# Patient Record
Sex: Male | Born: 1960
Health system: Southern US, Community
[De-identification: ages and names within clinical notes are randomized; demographics above are authoritative.]

## PROBLEM LIST (undated history)

## (undated) DIAGNOSIS — I255 Ischemic cardiomyopathy: Secondary | ICD-10-CM

## (undated) DIAGNOSIS — I119 Hypertensive heart disease without heart failure: Secondary | ICD-10-CM

## (undated) DIAGNOSIS — I10 Essential (primary) hypertension: Secondary | ICD-10-CM

## (undated) DIAGNOSIS — I208 Other forms of angina pectoris: Secondary | ICD-10-CM

## (undated) DIAGNOSIS — I5042 Chronic combined systolic (congestive) and diastolic (congestive) heart failure: Secondary | ICD-10-CM

## (undated) DIAGNOSIS — E119 Type 2 diabetes mellitus without complications: Secondary | ICD-10-CM

## (undated) DIAGNOSIS — K219 Gastro-esophageal reflux disease without esophagitis: Secondary | ICD-10-CM

## (undated) DIAGNOSIS — G4733 Obstructive sleep apnea (adult) (pediatric): Secondary | ICD-10-CM

## (undated) DIAGNOSIS — I2510 Atherosclerotic heart disease of native coronary artery without angina pectoris: Secondary | ICD-10-CM

## (undated) DIAGNOSIS — I219 Acute myocardial infarction, unspecified: Secondary | ICD-10-CM

## (undated) DIAGNOSIS — N184 Chronic kidney disease, stage 4 (severe): Secondary | ICD-10-CM

## (undated) DIAGNOSIS — Z8739 Personal history of other diseases of the musculoskeletal system and connective tissue: Secondary | ICD-10-CM

## (undated) DIAGNOSIS — I2089 Other forms of angina pectoris: Secondary | ICD-10-CM

## (undated) DIAGNOSIS — Q613 Polycystic kidney, unspecified: Secondary | ICD-10-CM

## (undated) DIAGNOSIS — Z9989 Dependence on other enabling machines and devices: Secondary | ICD-10-CM

## (undated) DIAGNOSIS — I350 Nonrheumatic aortic (valve) stenosis: Secondary | ICD-10-CM

## (undated) DIAGNOSIS — D649 Anemia, unspecified: Secondary | ICD-10-CM

## (undated) DIAGNOSIS — E785 Hyperlipidemia, unspecified: Secondary | ICD-10-CM

## (undated) DIAGNOSIS — I5043 Acute on chronic combined systolic (congestive) and diastolic (congestive) heart failure: Secondary | ICD-10-CM

## (undated) HISTORY — PX: CORONARY ANGIOPLASTY WITH STENT PLACEMENT: SHX49

## (undated) HISTORY — DX: Chronic kidney disease, stage 4 (severe): N18.4

## (undated) HISTORY — DX: Other forms of angina pectoris: I20.89

## (undated) HISTORY — PX: HERNIA REPAIR: SHX51

## (undated) HISTORY — DX: Type 2 diabetes mellitus without complications: E11.9

## (undated) HISTORY — DX: Anemia, unspecified: D64.9

## (undated) HISTORY — DX: Hyperlipidemia, unspecified: E78.5

## (undated) HISTORY — DX: Nonrheumatic aortic (valve) stenosis: I35.0

## (undated) HISTORY — DX: Atherosclerotic heart disease of native coronary artery without angina pectoris: I25.10

## (undated) HISTORY — DX: Chronic combined systolic (congestive) and diastolic (congestive) heart failure: I50.42

## (undated) HISTORY — PX: CYSTECTOMY: SUR359

## (undated) HISTORY — DX: Acute on chronic combined systolic (congestive) and diastolic (congestive) heart failure: I50.43

## (undated) HISTORY — DX: Ischemic cardiomyopathy: I25.5

## (undated) HISTORY — DX: Other forms of angina pectoris: I20.8

---

## 2004-09-10 HISTORY — PX: CORONARY ARTERY BYPASS GRAFT: SHX141

## 2010-07-27 ENCOUNTER — Observation Stay (HOSPITAL_COMMUNITY): Admission: EM | Admit: 2010-07-27 | Discharge: 2010-07-28 | Payer: Self-pay | Admitting: Emergency Medicine

## 2010-07-27 ENCOUNTER — Ambulatory Visit: Payer: Self-pay | Admitting: Cardiology

## 2010-09-10 HISTORY — PX: CARDIAC CATHETERIZATION: SHX172

## 2010-11-21 LAB — CBC
HCT: 39.7 % (ref 39.0–52.0)
HCT: 36.2 % — ABNORMAL LOW (ref 39.0–52.0)
Hemoglobin: 13.2 g/dL (ref 13.0–17.0)
MCH: 28.5 pg (ref 26.0–34.0)
MCH: 28.8 pg (ref 26.0–34.0)
MCHC: 33.2 g/dL (ref 30.0–36.0)
MCHC: 34 g/dL (ref 30.0–36.0)
MCV: 85.7 fL (ref 78.0–100.0)
MCV: 84.8 fL (ref 78.0–100.0)
Platelets: 184 K/uL (ref 150–400)
Platelets: 156 10*3/uL (ref 150–400)
RBC: 4.63 MIL/uL (ref 4.22–5.81)
RDW: 12.8 % (ref 11.5–15.5)
RDW: 12.7 % (ref 11.5–15.5)
WBC: 5.8 K/uL (ref 4.0–10.5)

## 2010-11-21 LAB — POCT CARDIAC MARKERS
CKMB, poc: 1.3 ng/mL (ref 1.0–8.0)
Myoglobin, poc: 89.1 ng/mL (ref 12–200)
Troponin i, poc: <0.05 ng/mL (ref 0.00–0.09)

## 2010-11-21 LAB — LIPID PANEL
Cholesterol: 184 mg/dL (ref 0–200)
HDL: 35 mg/dL — ABNORMAL LOW (ref >39)
LDL Cholesterol: 117 mg/dL — ABNORMAL HIGH (ref 0–99)
Total CHOL/HDL Ratio: 5.3 ratio
Triglycerides: 161 mg/dL — ABNORMAL HIGH (ref <150)
VLDL: 32 mg/dL (ref 0–40)

## 2010-11-21 LAB — DIFFERENTIAL
Basophils Absolute: 0 K/uL (ref 0.0–0.1)
Basophils Relative: 1 % (ref 0–1)
Eosinophils Absolute: 0.1 K/uL (ref 0.0–0.7)
Eosinophils Relative: 2 % (ref 0–5)
Lymphocytes Relative: 37 % (ref 12–46)
Lymphs Abs: 2.1 K/uL (ref 0.7–4.0)
Monocytes Absolute: 0.6 K/uL (ref 0.1–1.0)
Monocytes Relative: 10 % (ref 3–12)
Neutro Abs: 3 K/uL (ref 1.7–7.7)
Neutrophils Relative %: 52 % (ref 43–77)

## 2010-11-21 LAB — GLUCOSE, CAPILLARY
Glucose-Capillary: 204 mg/dL — ABNORMAL HIGH (ref 70–99)
Glucose-Capillary: 106 mg/dL — ABNORMAL HIGH (ref 70–99)
Glucose-Capillary: 146 mg/dL — ABNORMAL HIGH (ref 70–99)

## 2010-11-21 LAB — BASIC METABOLIC PANEL WITH GFR
BUN: 17 mg/dL (ref 6–23)
CO2: 26 meq/L (ref 19–32)
Calcium: 9 mg/dL (ref 8.4–10.5)
Chloride: 113 meq/L — ABNORMAL HIGH (ref 96–112)
Creatinine, Ser: 1.32 mg/dL (ref 0.4–1.5)
GFR calc Af Amer: >60 mL/min (ref >60)
GFR calc non Af Amer: 57 mL/min — ABNORMAL LOW (ref >60)
Glucose, Bld: 107 mg/dL — ABNORMAL HIGH (ref 70–99)
Potassium: 4.5 meq/L (ref 3.5–5.1)
Sodium: 143 meq/L (ref 135–145)

## 2010-11-21 LAB — COMPREHENSIVE METABOLIC PANEL
Albumin: 3.3 g/dL — ABNORMAL LOW (ref 3.5–5.2)
Alkaline Phosphatase: 66 U/L (ref 39–117)
BUN: 16 mg/dL (ref 6–23)
Calcium: 8.8 mg/dL (ref 8.4–10.5)
Creatinine, Ser: 1.35 mg/dL (ref 0.4–1.5)
Glucose, Bld: 117 mg/dL — ABNORMAL HIGH (ref 70–99)
Potassium: 3.9 mEq/L (ref 3.5–5.1)
Total Protein: 6.1 g/dL (ref 6.0–8.3)

## 2010-11-21 LAB — CK TOTAL AND CKMB (NOT AT ARMC)
CK, MB: 2 ng/mL (ref 0.3–4.0)
Relative Index: 1 (ref 0.0–2.5)
Total CK: 207 U/L (ref 7–232)

## 2010-11-21 LAB — PROTIME-INR
INR: 0.94 (ref 0.00–1.49)
Prothrombin Time: 12.8 seconds (ref 11.6–15.2)

## 2010-11-21 LAB — CARDIAC PANEL(CRET KIN+CKTOT+MB+TROPI)
CK, MB: 1.3 ng/mL (ref 0.3–4.0)
Relative Index: 1 (ref 0.0–2.5)
Total CK: 147 U/L (ref 7–232)
Total CK: 132 U/L (ref 7–232)
Troponin I: 0.01 ng/mL (ref 0.00–0.06)

## 2010-11-21 LAB — HEMOGLOBIN A1C
Hgb A1c MFr Bld: 7.3 % — ABNORMAL HIGH (ref <5.7)
Mean Plasma Glucose: 163 mg/dL — ABNORMAL HIGH (ref <117)

## 2010-11-21 LAB — TROPONIN I: Troponin I: 0.01 ng/mL (ref 0.00–0.06)

## 2010-12-01 ENCOUNTER — Emergency Department (HOSPITAL_COMMUNITY): Payer: Medicare Other

## 2010-12-01 ENCOUNTER — Inpatient Hospital Stay (HOSPITAL_COMMUNITY)
Admission: EM | Admit: 2010-12-01 | Discharge: 2010-12-06 | DRG: 303 | Disposition: A | Discharge status: Home / Self Care | Payer: Medicare Other | Attending: Internal Medicine | Admitting: Internal Medicine

## 2010-12-01 DIAGNOSIS — Z7982 Long term (current) use of aspirin: Secondary | ICD-10-CM

## 2010-12-01 DIAGNOSIS — I252 Old myocardial infarction: Secondary | ICD-10-CM

## 2010-12-01 DIAGNOSIS — E785 Hyperlipidemia, unspecified: Secondary | ICD-10-CM | POA: Diagnosis present

## 2010-12-01 DIAGNOSIS — I200 Unstable angina: Secondary | ICD-10-CM | POA: Diagnosis present

## 2010-12-01 DIAGNOSIS — I517 Cardiomegaly: Secondary | ICD-10-CM

## 2010-12-01 DIAGNOSIS — N179 Acute kidney failure, unspecified: Secondary | ICD-10-CM | POA: Diagnosis present

## 2010-12-01 DIAGNOSIS — E7800 Pure hypercholesterolemia, unspecified: Secondary | ICD-10-CM | POA: Diagnosis present

## 2010-12-01 DIAGNOSIS — N189 Chronic kidney disease, unspecified: Secondary | ICD-10-CM | POA: Diagnosis present

## 2010-12-01 DIAGNOSIS — I259 Chronic ischemic heart disease, unspecified: Secondary | ICD-10-CM | POA: Diagnosis present

## 2010-12-01 DIAGNOSIS — R079 Chest pain, unspecified: Secondary | ICD-10-CM

## 2010-12-01 DIAGNOSIS — I129 Hypertensive chronic kidney disease with stage 1 through stage 4 chronic kidney disease, or unspecified chronic kidney disease: Secondary | ICD-10-CM | POA: Diagnosis present

## 2010-12-01 DIAGNOSIS — I2510 Atherosclerotic heart disease of native coronary artery without angina pectoris: Principal | ICD-10-CM | POA: Diagnosis present

## 2010-12-01 DIAGNOSIS — Z951 Presence of aortocoronary bypass graft: Secondary | ICD-10-CM

## 2010-12-01 LAB — CBC
HCT: 35.4 % — ABNORMAL LOW (ref 39.0–52.0)
Hemoglobin: 12.3 g/dL — ABNORMAL LOW (ref 13.0–17.0)
MCH: 28.2 pg (ref 26.0–34.0)
MCV: 82.1 fL (ref 78.0–100.0)
Platelets: 187 10*3/uL (ref 150–400)
RBC: 4.31 MIL/uL (ref 4.22–5.81)
RBC: 4.22 MIL/uL (ref 4.22–5.81)
WBC: 6.2 10*3/uL (ref 4.0–10.5)
WBC: 8.4 10*3/uL (ref 4.0–10.5)

## 2010-12-01 LAB — CARDIAC PANEL(CRET KIN+CKTOT+MB+TROPI)
CK, MB: 1.3 ng/mL (ref 0.3–4.0)
CK, MB: 1.2 ng/mL (ref 0.3–4.0)
Total CK: 106 U/L (ref 7–232)
Troponin I: 0.01 ng/mL (ref 0.00–0.06)

## 2010-12-01 LAB — GLUCOSE, CAPILLARY
Glucose-Capillary: >600 mg/dL (ref 70–99)
Glucose-Capillary: 546 mg/dL — ABNORMAL HIGH (ref 70–99)
Glucose-Capillary: 419 mg/dL — ABNORMAL HIGH (ref 70–99)
Glucose-Capillary: 329 mg/dL — ABNORMAL HIGH (ref 70–99)
Glucose-Capillary: 235 mg/dL — ABNORMAL HIGH (ref 70–99)
Glucose-Capillary: 157 mg/dL — ABNORMAL HIGH (ref 70–99)
Glucose-Capillary: 167 mg/dL — ABNORMAL HIGH (ref 70–99)
Glucose-Capillary: 300 mg/dL — ABNORMAL HIGH (ref 70–99)

## 2010-12-01 LAB — HEPATIC FUNCTION PANEL
ALT: 17 U/L (ref 0–53)
AST: 17 U/L (ref 0–37)
Albumin: 3.8 g/dL (ref 3.5–5.2)
Alkaline Phosphatase: 94 U/L (ref 39–117)
Total Bilirubin: 0.7 mg/dL (ref 0.3–1.2)

## 2010-12-01 LAB — POCT I-STAT, CHEM 8
BUN: 32 mg/dL — ABNORMAL HIGH (ref 6–23)
Calcium, Ion: 1.16 mmol/L (ref 1.12–1.32)
Creatinine, Ser: 2.2 mg/dL — ABNORMAL HIGH (ref 0.4–1.5)
Hemoglobin: 12.6 g/dL — ABNORMAL LOW (ref 13.0–17.0)
TCO2: 21 mmol/L (ref 0–100)

## 2010-12-01 LAB — COMPREHENSIVE METABOLIC PANEL
Alkaline Phosphatase: 76 U/L (ref 39–117)
BUN: 25 mg/dL — ABNORMAL HIGH (ref 6–23)
Glucose, Bld: 249 mg/dL — ABNORMAL HIGH (ref 70–99)
Potassium: 4.2 mEq/L (ref 3.5–5.1)
Total Bilirubin: 0.7 mg/dL (ref 0.3–1.2)
Total Protein: 6.9 g/dL (ref 6.0–8.3)

## 2010-12-01 LAB — TSH: TSH: 2.202 u[IU]/mL (ref 0.350–4.500)

## 2010-12-01 LAB — D-DIMER, QUANTITATIVE: D-Dimer, Quant: <0.22 ug/mL-FEU (ref 0.00–0.48)

## 2010-12-01 LAB — MAGNESIUM: Magnesium: 2.2 mg/dL (ref 1.5–2.5)

## 2010-12-01 LAB — LIPID PANEL
Cholesterol: 148 mg/dL (ref 0–200)
LDL Cholesterol: UNDETERMINED mg/dL (ref 0–99)

## 2010-12-01 LAB — DIFFERENTIAL
Lymphocytes Relative: 33 % (ref 12–46)
Lymphs Abs: 2.1 10*3/uL (ref 0.7–4.0)
Neutrophils Relative %: 54 % (ref 43–77)

## 2010-12-01 LAB — PROTIME-INR
INR: 0.86 (ref 0.00–1.49)
Prothrombin Time: 11.9 seconds (ref 11.6–15.2)

## 2010-12-01 LAB — MRSA PCR SCREENING: MRSA by PCR: NEGATIVE

## 2010-12-01 LAB — POCT CARDIAC MARKERS: Troponin i, poc: <0.05 ng/mL (ref 0.00–0.09)

## 2010-12-02 DIAGNOSIS — I200 Unstable angina: Secondary | ICD-10-CM

## 2010-12-02 LAB — GLUCOSE, CAPILLARY
Glucose-Capillary: 307 mg/dL — ABNORMAL HIGH (ref 70–99)
Glucose-Capillary: 305 mg/dL — ABNORMAL HIGH (ref 70–99)
Glucose-Capillary: 339 mg/dL — ABNORMAL HIGH (ref 70–99)
Glucose-Capillary: 316 mg/dL — ABNORMAL HIGH (ref 70–99)
Glucose-Capillary: 141 mg/dL — ABNORMAL HIGH (ref 70–99)
Glucose-Capillary: 118 mg/dL — ABNORMAL HIGH (ref 70–99)
Glucose-Capillary: 110 mg/dL — ABNORMAL HIGH (ref 70–99)
Glucose-Capillary: 214 mg/dL — ABNORMAL HIGH (ref 70–99)
Glucose-Capillary: 339 mg/dL — ABNORMAL HIGH (ref 70–99)

## 2010-12-02 LAB — CBC
MCH: 29 pg (ref 26.0–34.0)
MCHC: 35.2 g/dL (ref 30.0–36.0)
Platelets: 167 10*3/uL (ref 150–400)
RBC: 3.72 MIL/uL — ABNORMAL LOW (ref 4.22–5.81)

## 2010-12-02 LAB — BASIC METABOLIC PANEL
Calcium: 8.3 mg/dL — ABNORMAL LOW (ref 8.4–10.5)
Creatinine, Ser: 1.34 mg/dL (ref 0.4–1.5)
GFR calc Af Amer: >60 mL/min (ref >60)

## 2010-12-02 LAB — SODIUM, URINE, RANDOM: Sodium, Ur: 102 mEq/L

## 2010-12-02 LAB — CREATININE, URINE, RANDOM: Creatinine, Urine: 118.6 mg/dL

## 2010-12-02 LAB — LIPASE, BLOOD: Lipase: 41 U/L (ref 11–59)

## 2010-12-03 LAB — CARDIAC PANEL(CRET KIN+CKTOT+MB+TROPI)
CK, MB: 1.4 ng/mL (ref 0.3–4.0)
CK, MB: 1.8 ng/mL (ref 0.3–4.0)
Relative Index: 1.2 (ref 0.0–2.5)
Total CK: 118 U/L (ref 7–232)
Total CK: 155 U/L (ref 7–232)
Troponin I: 0.02 ng/mL (ref 0.00–0.06)

## 2010-12-03 LAB — BASIC METABOLIC PANEL
BUN: 15 mg/dL (ref 6–23)
CO2: 22 mEq/L (ref 19–32)
Chloride: 110 mEq/L (ref 96–112)
Glucose, Bld: 283 mg/dL — ABNORMAL HIGH (ref 70–99)
Potassium: 4.3 mEq/L (ref 3.5–5.1)

## 2010-12-03 LAB — CBC
HCT: 33.3 % — ABNORMAL LOW (ref 39.0–52.0)
Hemoglobin: 11.6 g/dL — ABNORMAL LOW (ref 13.0–17.0)
MCV: 82.8 fL (ref 78.0–100.0)
RDW: 12.7 % (ref 11.5–15.5)
WBC: 7 10*3/uL (ref 4.0–10.5)

## 2010-12-03 LAB — GLUCOSE, CAPILLARY: Glucose-Capillary: 228 mg/dL — ABNORMAL HIGH (ref 70–99)

## 2010-12-04 LAB — BASIC METABOLIC PANEL
BUN: 13 mg/dL (ref 6–23)
Creatinine, Ser: 1.34 mg/dL (ref 0.4–1.5)
GFR calc non Af Amer: 56 mL/min — ABNORMAL LOW (ref >60)
Glucose, Bld: 279 mg/dL — ABNORMAL HIGH (ref 70–99)

## 2010-12-04 LAB — GLUCOSE, CAPILLARY
Glucose-Capillary: 210 mg/dL — ABNORMAL HIGH (ref 70–99)
Glucose-Capillary: 329 mg/dL — ABNORMAL HIGH (ref 70–99)

## 2010-12-04 LAB — CARDIAC PANEL(CRET KIN+CKTOT+MB+TROPI): Troponin I: 0.01 ng/mL (ref 0.00–0.06)

## 2010-12-04 LAB — TRIGLYCERIDES: Triglycerides: 480 mg/dL — ABNORMAL HIGH (ref <150)

## 2010-12-04 LAB — HEPARIN LEVEL (UNFRACTIONATED)
Heparin Unfractionated: 0.79 IU/mL — ABNORMAL HIGH (ref 0.30–0.70)
Heparin Unfractionated: 0.47 IU/mL (ref 0.30–0.70)
Heparin Unfractionated: 0.56 IU/mL (ref 0.30–0.70)

## 2010-12-05 ENCOUNTER — Encounter: Payer: Self-pay | Admitting: Cardiology

## 2010-12-05 LAB — CBC
HCT: 33.9 % — ABNORMAL LOW (ref 39.0–52.0)
MCHC: 34.8 g/dL (ref 30.0–36.0)
MCV: 83.1 fL (ref 78.0–100.0)
Platelets: 192 10*3/uL (ref 150–400)
RDW: 12.7 % (ref 11.5–15.5)
WBC: 6.7 10*3/uL (ref 4.0–10.5)

## 2010-12-05 LAB — BASIC METABOLIC PANEL
CO2: 25 mEq/L (ref 19–32)
Calcium: 9.1 mg/dL (ref 8.4–10.5)
Creatinine, Ser: 1.35 mg/dL (ref 0.4–1.5)
GFR calc non Af Amer: 56 mL/min — ABNORMAL LOW (ref >60)
Glucose, Bld: 239 mg/dL — ABNORMAL HIGH (ref 70–99)
Sodium: 141 mEq/L (ref 135–145)

## 2010-12-05 LAB — GLUCOSE, CAPILLARY: Glucose-Capillary: 249 mg/dL — ABNORMAL HIGH (ref 70–99)

## 2010-12-06 LAB — BASIC METABOLIC PANEL
CO2: 24 mEq/L (ref 19–32)
Calcium: 9.6 mg/dL (ref 8.4–10.5)
Chloride: 110 mEq/L (ref 96–112)
GFR calc Af Amer: >60 mL/min (ref >60)
Glucose, Bld: 138 mg/dL — ABNORMAL HIGH (ref 70–99)
Sodium: 141 mEq/L (ref 135–145)

## 2010-12-06 LAB — GLUCOSE, CAPILLARY: Glucose-Capillary: 221 mg/dL — ABNORMAL HIGH (ref 70–99)

## 2010-12-11 ENCOUNTER — Other Ambulatory Visit: Payer: Self-pay | Admitting: Family Medicine

## 2010-12-11 ENCOUNTER — Ambulatory Visit
Admission: RE | Admit: 2010-12-11 | Discharge: 2010-12-11 | Disposition: A | Discharge status: Home / Self Care | Payer: Medicare Other | Source: Ambulatory Visit | Attending: Family Medicine | Admitting: Family Medicine

## 2010-12-11 NOTE — H&P (Signed)
NAMELARNIE, Matthew Rowe              ACCOUNT NO.:  [id number]  MEDICAL RECORD NO.:  [id number]           PATIENT TYPE:  E  LOCATION:  [facility]                         FACILITY:  [facility]  PHYSICIAN:  Rise Patience, MDDATE OF BIRTH:  05-21-1961  DATE OF ADMISSION:  12/01/2010 DATE OF DISCHARGE:                             HISTORY & PHYSICAL   PRIMARY CARE PHYSICIAN:  Dr. Lorelei Pont.  CHIEF COMPLAINT:  Chest pain.  HISTORY OF PRESENT ILLNESS:  A 50 year old male with known history of CAD, status post CABG, status post stenting, hypertension, hyperlipidemia, and diabetes mellitus type 2 having experiencing chest pain since last night around 10 o'clock, feeling retrosternal pressure like with exertion and mild shortness of breath.  In the ER, the patient was found to have normal cardiac enzymes.  EKG did not show anything acute.  The patient has been admitted for chest pain management.  In addition, the patient's blood sugar was found to be more than 600, his anion gap is only 10.  The patient has been started on IV glucose stabilizer.  The patient states that he does have mild nausea, but no vomiting.  He denies any abdominal pain, dysuria, discharge, diarrhea, any weakness, loss of consciousness, or any focal deficits.  PAST MEDICAL HISTORY: 1. History of CAD, status post CABG, status post stenting. 2. History of hypertension. 3. History of diabetes mellitus type 2. 4. Hyperlipidemia. 5. Obesity.  PAST SURGICAL HISTORY:  CABG, stent placement, and a cyst removal from the face.  MEDICATIONS UPON ADMISSION: 1. Imdur 30 mg daily. 2. Aspirin 325 mg. 3. Nitroglycerin p.r.n. 4. Coreg 25 mg p.o. b.i.d.  ALLERGIES:  BIAXIN.  FAMILY HISTORY:  Significant for coronary artery disease in his father and had MI at age 15 to early 10s, stroke, and had MI.  SOCIAL HISTORY:  The patient lives in [place] with his wife.  Denies smoking cigarettes, drinking alcohol, or using illegal  drugs.  REVIEW OF SYSTEMS:  As per history of present illness nothing else significant.  PHYSICAL EXAMINATION:  GENERAL:  The patient examined at bedside, not in acute distress. VITAL SIGNS:  Blood pressure is 136/85, pulse 94 per minute, temperature 98.4, respirations 18 per minute, O2 sat 100%. HEENT:  Anicteric.  No pallor.  No discharge from ears, eyes, nose, or mouth. CHEST:  Bilateral air entry present.  No rhonchi, no crepitation. HEART:  S1 and S2 heard. ABDOMEN:  Soft, nontender.  Bowel sounds heard. CNS:  The patient alert, awake, and oriented to time, place, and person. Moves upper and lower extremities 5/5. EXTREMITIES:  Peripheral pulses felt.  No edema.  LABORATORY DATA:  EKG shows normal sinus rhythm, heart rate is around 95 beats per minute, poor R-wave progression, ST-T changes, which are comparable to the old EKG.  Chest x-ray shows no acute findings.  CBC: WBC 6.2, hemoglobin 12.6, hematocrit 37, PT/INR is 11.8.  Basic metabolic panel:  Sodium Q000111Q, potassium 4.4, chloride 102.  I-stat: Bicarb is 21, anion gap is 10, BUN 32, creatinine 2.2.  CK-MB is 136, troponin is 0.02, troponin I was less than 0.05, myoglobin 91.3.  ASSESSMENT: 1. Chest  pain to rule out acute coronary syndrome, concerning for     unstable angina. 2. Severe hyperglycemia secondary to uncontrolled diabetes mellitus     type 2. 3. Acute renal failure on chronic kidney disease. 4. History of hypertension. 5. History of coronary artery disease, status post coronary artery     bypass grafting, status post stenting. 6. Hyperlipidemia. 7. Obesity.  PLAN: 1. At this time, we will admit the patient to step-down unit. 2. For his chest pain, the patient has been started on IV     nitroglycerin.  We will place the patient also on heparin if lipase     is negative.  We will be cycling cardiac markers.  The patient will     be on aspirin.  The patient had just thrown up.  We are going to     check a  stat LFTs and lipase, if they are elevated, then we need to     check scan of the abdomen.  We are going to consult Cardiology. 3. For his acute renal failure on chronic kidney disease, at this     time, we are going to hydrate with normal saline.  Recheck CMET and     we will place the patient on strict intake, output, and urine     sodium and creatinine. 4. Severe hyperglycemia secondary to uncontrolled diabetes mellitus     type 2.  The patient is already started on IV glucose stabilizer.     Once his blood sugar gets controlled, we will change to long-acting     insulin.  We will check hemoglobin A1c. 5. Further recommendation as condition evolves.     Rise Patience, MD    ANK/MEDQ  D:  12/01/2010  T:  12/01/2010  Job:  [id number]  cc:   Dr. Lorelei Pont  Electronically Signed by Gean Birchwood MD on 12/11/2010 08:22:20 AM

## 2010-12-13 NOTE — Discharge Summary (Signed)
NAMESHAQUIL, Matthew Rowe              ACCOUNT NO.:  [id number]  MEDICAL RECORD NO.:  [id number]           PATIENT TYPE:  I  LOCATION:  [id number]                         FACILITY:  [facility]  PHYSICIAN:  Oren Binet, MD    DATE OF BIRTH:  June 18, 1961  DATE OF ADMISSION:  12/01/2010 DATE OF DISCHARGE:  12/06/2010                        DISCHARGE SUMMARY - REFERRING   PRIMARY CARE PRACTITIONER:  Darreld Mclean, MD.  PRIMARY CARDIOLOGIST:  Ludwig Lean. Doreatha Lew, M.D.  DISCHARGE DIAGNOSES: 1. Chest pain for further outpatient cardiac workup and follow up with     primary cardiologist. 2. Uncontrolled diabetes, now on insulin. 3. Acute renal failure, resolved with hydration. 4. Dyslipidemia.  SECONDARY DISCHARGE DIAGNOSES: 1. History of coronary artery disease with history of an acute     inferior wall myocardial infarction in 1999, status post RCA     stenting. 2. 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  He subsequently underwent a coronary artery bypass graft     with RIMA graft placed to the mid-distal right coronary artery.     Three days following coronary artery bypass graft, the patient had     an ST-segment elevation myocardial infarction, where he was noted     to have internal myocardial bridging involving the left anterior     descending artery, he then had 3 stents placed from the distal to     the proximal stent segment of the left anterior descending artery.     Four days after the above procedure, he developed acute thrombosis     of the distal LAD stent with complete occlusion of the LAD distally     and even proximally.  He underwent primary intervention at that     time with clot removal with inability to open the distal LAD beyond     the distal stent resulting in complete chronic occlusion of the LAD     as it curves around the LV apex.  The patient had a  repeat cardiac catheterization done in 2007, which showed a patent right internal mammary artery graft, chronic occlusion of the distal left anterior ascending artery following terminal portions of the previously placed stent.  Obstructive lesions within the proximal and mid stents as described above.  Since 2007, he has had no further cardiac workup. 1. History of hypertension. 2. History of diabetes. 3. History of dyslipidemia. 4. He also had a nuclear stress test done on July 28, 2010 with a     questionable small focus of inducible ischemia in the distal     inferior wall and anterior apex.  At that time, per the discharge     summary available in E chart, Cardiology felt that it was a low     risk scan.  DISCHARGE MEDICATIONS: 1. Amaryl 2 mg 1 tablet daily. 2. Amlodipine 10 mg 1 tablet p.o. daily. 3. Lantus 38 units subcutaneously at bedtime. 4. Losartan 100 mg p.o. daily. 5. Tylenol 500 mg 2 tablets p.o. twice day p.r.n. 6.  Lipitor 40 mg 1 tablet p.o. every evening. 7. Aspirin 325 mg 1 tablet p.o. daily. 8. Coreg 25 mg 1 tablet p.o. twice daily. 9. Imdur 60 mg 1 tablet daily. 10.Nitroglycerin sublingually 0.4 mg 1 tablet under the tongue every 5     minutes up to 3 dose p.r.n. 11.Vitamin E 1000 units 1 capsule p.o. twice daily. 12.Ambien 10 mg 1 tablet p.o. at bedtime.  CONSULTATIONS:  [facility].  HISTORY OF PRESENT ILLNESS:  The patient is a 50 year old male with an extensive past medical history of coronary artery disease, status post CABG and stenting; hypertension; dyslipidemia; and diabetes, who was admitted on December 01, 2010 for chest pain.  He was also found to have a blood sugar more than 600 and required IV glucose stabilizer.  He was then admitted to a step-down unit.  For further details, please see the history and physical that was dictated by Dr. Hal Hope on admission.  A 2-D echocardiogram done on December 01, 1010 showed an EF around  55%-60%. Left ventricle wall thickness was increased in the pattern of mild LVH.  PERTINENT LABORATORY DATA: 1. Cardiac enzymes were cycled, and these were negative. 2. TSH was 2.02. 3. HbA1c is 11.8. 4. Creatinine on admission was 1.65. 5. D-dimer was less than 0.22. 6. Triglycerides were 480.  PERTINENT RADIOLOGICAL STUDIES:  Portable chest x-ray shows no acute findings.  BRIEF HOSPITAL COURSE: 1. Chest pain.  The patient has an extensively complicated history of     ischemic heart disease.  It all started in 1999, when he had     inferior wall MI secondary to a RCA lesion that was stented.  In     the early 2000, he then started having chest pain.  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  Following this, he underwent coronary artery bypass grafting     with a RIMA to the mid-to-distal RCA.  Unfortunately, following     this, he had extensive complications including an anterior wall     infarction secondary to intramyocardial bridging involving the LAD,     which required 3 stents to be placed from the proximal to the     distal segment.  Four days following this, the patient again had an     acute thrombosis and further cardiac cath done during that time     showed occlusion of the LAD distally and even proximally.  He then     underwent primary intervention at that time with clot removal with     inability to open the distal LAD beyond the distal stent resulting     in complete chronic occlusion of the distal LAD.  His last cardiac     workup involving a left heart catheterization was in 2007, which     showed the proximal segment of the LAD to be normal.  However,     there was complete occlusion of the distal LAD after the third     stent.  The RIMA graft was noted to be patent as  well.  Given this     history, this patient was admitted to the hospital and a Cardiology     consultation was obtained.  The patient was also started  on a     nitroglycerin drip along with a heparin drip.  He was placed on     aspirin along with statin.  His beta-blocker was resumed.  It was     felt that this was all unstable angina and Cardiology were planning     to do a cardiac catheterization.  However, they are waiting for his     prior workup as outlined above to be obtained from [facility] in [place].  This then finally arrived.  After reviewing     this, the patient's cardiac cath was then subsequently cancelled     and his heparin and nitroglycerin drips were also discontinued.     Following discontinuation of his nitroglycerin and heparin drip,     the patient has been chest pain free for more than 24 hours.  I did     have a detailed discussion with Dr. Mare Ferrari over the phone.  His     prior records that are available in the chart were discussed in     detail with Dr. Mare Ferrari.  Dr. Mare Ferrari felt that because the     patient is chest pain free off heparin and nitroglycerin drip, he     feels that the patient does not need an inpatient left heart     cardiac catheterization, and at this point, he is suggesting that     this patient be discharged home to follow up with Dr. Doreatha Lew.  An     appointment has been scheduled for him on April 11 at 10:00 a.m.     This information is also available on the pink sheet as well.  A 2-     D echocardiogram done during this admission showed an EF around 55%-     60%.  Please note that this patient also had a nuclear stress test     done in November 2011, which was felt to be low risk.  The patient     will be continued on aspirin, statin, beta-blocker along with     Imdur, and further workup will be done as the patient will follow     up with [facility].  The patient has been instructed to     seek medical  attention if he again has severe chest pain. 2. Uncontrolled diabetes.  The patient has a longstanding history of     diabetes as noted above.  He had a blood sugar more than 600 on     admission.  He was probably placed on a glucose stabilizer     protocol.  Following that, he was weaned off the insulin and     started on Lantus.  He was given extensive diabetic education.     Plan is to discharge him on Lantus at 38 units along with Amaryl.     He will follow up with his primary care doctor, Dr. Lorelei Pont for     further optimization of his diabetic regimen. 3. Hypertension.  This was moderately well controlled during this     hospitalization.  On day of discharge, his amlodipine has been     increased to 10 mg, and he will resume rest of his other     medications noted as above.  He will follow up with his primary     care doctor as well as his primary cardiologist for further     optimization of his blood pressure control as well. 4.  Dyslipidemia.  The patient has an elevated triglyceride level.  It     is thought that this was secondary to diabetes.  He has been     resumed on his Lipitor.  With further control of his sugars, if his     triglyceride levels do not come down, he will need to be started on     perhaps fenofibrate.  We will defer this to his primary care     practitioner and his primary cardiologist.  DISPOSITION:  It is, at this time, felt that this patient is stable to be discharged home with outpatient followup with his primary doctors as outlined above.  FOLLOW-UP INSTRUCTIONS: 1. The patient will follow up with Dr. Lorelei Pont.  He is to call and     make an appointment within 1 week's time.  He claims understanding. 2. The patient is to follow up with Dr. Romeo Apple, the patient's     primary cardiologist on April 11 at 10:00 a.m.  This appointment     has already been made for the patient. 3. An outpatient diabetic education consult has also been ordered for      the patient. 4. The patient is to seek immediate medical attention if he has     recurrent chest pain given his complicated history.  Total time spent equals 60 minutes.     Oren Binet, MD     SG/MEDQ  D:  12/06/2010  T:  12/06/2010  Job:  [id number]  cc:   Darreld Mclean, MD Ludwig Lean Doreatha Lew, M.D.  Electronically Signed by Oren Binet  on 12/13/2010 04:46:12 AM

## 2010-12-20 ENCOUNTER — Ambulatory Visit (INDEPENDENT_AMBULATORY_CARE_PROVIDER_SITE_OTHER): Payer: Medicare Other | Admitting: Cardiology

## 2010-12-20 ENCOUNTER — Encounter: Payer: Self-pay | Admitting: Cardiology

## 2010-12-20 DIAGNOSIS — R079 Chest pain, unspecified: Secondary | ICD-10-CM

## 2010-12-20 DIAGNOSIS — N179 Acute kidney failure, unspecified: Secondary | ICD-10-CM

## 2010-12-20 DIAGNOSIS — E1169 Type 2 diabetes mellitus with other specified complication: Secondary | ICD-10-CM | POA: Insufficient documentation

## 2010-12-20 DIAGNOSIS — E119 Type 2 diabetes mellitus without complications: Secondary | ICD-10-CM

## 2010-12-20 DIAGNOSIS — I2510 Atherosclerotic heart disease of native coronary artery without angina pectoris: Secondary | ICD-10-CM

## 2010-12-20 NOTE — Consult Note (Signed)
Matthew Rowe, Matthew Rowe              ACCOUNT NO.:  [id number]  MEDICAL RECORD NO.:  [id number]           PATIENT TYPE:  I  LOCATION:  2911                         FACILITY:  [facility]  PHYSICIAN:  Ludwig Lean. Doreatha Lew, M.D.DATE OF BIRTH:  Jan 28, 1961  DATE OF CONSULTATION:  12/01/2010 DATE OF DISCHARGE:                                CONSULTATION   PRIMARY CARDIOLOGIST:  New patient to [facility].  PRIMARY CARE PROVIDER:  Dr. Lorelei Pont.  REFERRING PHYSICIAN:  Rise Patience, MD  REASON FOR CONSULTATION:  Chest pain.  HISTORY OF PRESENT ILLNESS:  This is a 50 year old African American gentleman with known coronary artery disease status post coronary artery bypass grafting x3 per patient in 2006, although our last consultation note states that he had 1 vessel coronary artery bypass grafting.  We will obtain these records.  The patient was last seen in November 2011 for chest pain.  At this time a Myoview was completed showing questionable ischemia as well an ejection fraction of 46%.  At this time it was decided that we would continue with medical management as well as risk factors modification as he does have uncontrolled diabetes mellitus, hypertension, hyperlipidemia, and obesity.  Since discharge he states he has had intermittent chest pain that progressively worsened over the last 4 weeks.  Over this time, he has taken entire bottle of nitroglycerin.  The nitroglycerin usually relieved his pain.  He presented to the emergency department because last evening around 10 o'clock, he began to have retrosternal chest pressure as well as a burning sensation.  He took his normal nitroglycerin, Tylenol, and Coreg and was able to fall sleep.  He was then awoken around 1:30 a.m. with crushing chest pain, this was unrelieved by nitroglycerin.  When the pain increased in intensity, he came to the emergency department for evaluation.  In the emergency department, the patient's  initial glucose was noted to be greater than 700.  He was started on IV glucose stabilizer at this time.  With his morning chest pain, he also had nausea and had subsequently vomited with improvement in burning sensation.  The patient still with minimal chest pain.  His EKG is without acute changes and is unchanged from prior tracing in November.  His initial set of cardiac enzymes are negative x1.  He was placed on nitroglycerin drip which has eased his chest pain but is still minimally there.  The patient has been admitted by the Hospitalist Service and Cardiology has been consulted for chest pain.  Of note, the patient has decreased his metformin dose secondary to uncontrollable diarrhea.  He is to follow up with Dr. Edilia Bo in the near future for reevaluation.  PAST MEDICAL HISTORY: 1. Coronary artery disease status post coronary artery bypass grafting     in 2006 in [place].  Questionable as this is 3 graft versus 1     graft.     a.     Status post myocardial infarction in 2006 with subsequent      PCI x2 or 3, the patient is unsure.     b.     Status post Myoview  in November 2011 demonstrating apical      infarct with questionable small focus of inducible ischemia in the      distal inferior wall in the inferior apex.  Left ventricular      ejection fraction 46%. 2. Uncontrolled diabetes mellitus, the patient has cut his metformin     dose secondary to diarrhea. 3. Hypertension. 4. Hyperlipidemia. 5. Obesity.  SOCIAL HISTORY:  The patient lives in [place] with his wife.  He is on disability.  He denies any tobacco, alcohol, or illicit drug use.  FAMILY HISTORY:  Pertinent for early coronary artery disease.  His father is deceased from a massive heart attack with his first heart attack being in his 52s.  His sister had myocardial infarction at age 71.  He has multiple cousins and nieces with early onset coronary artery disease.  ALLERGIES:  BIAXIN.  HOME  MEDICATIONS: 1. Imdur 60 mg daily. 2. Aspirin 325 mg as needed. 3. Nitroglycerin sublingual as needed. 4. Coreg 25 mg twice daily. 5. Metformin 1000 mg twice daily, although the patient is only taking     500 mg daily. 6. Tylenol Extra Strength as needed. 7. Ambien 10 mg daily. 8. Lisinopril 40 mg daily. 9. Pravastatin 40 mg daily.  REVIEW OF SYSTEMS:  All pertinent positives and negatives as stated in HPI.  All other systems have been reviewed and are negative.  PHYSICAL EXAMINATION:  VITAL SIGNS:  Temperature 98.4, pulse 96, respiration 18, blood pressure 120-142/76 and 92, O2 saturation 100% on room air. GENERAL:  This is a mildly obese middle aged gentleman.  He is in no acute distress. HEENT:  Normal. NECK:  Supple without bruit or JVD.  No lymphadenopathy. CARDIOVASCULAR:  Heart regular rate and rhythm with S1, S2.  No murmur, rub, or gallop noted.  PMI is normal.  Pulses are 2+ and equal bilaterally. LUNGS:  Clear to auscultation bilaterally without wheezes, rales, or rhonchi. ABDOMEN:  Soft, nontender, positive bowel sounds x4.  There is negative hepatosplenomegaly. EXTREMITIES:  No clubbing, cyanosis, or edema noted. MUSCULOSKELETAL:  No joint deformities or effusions. NEURO:  Alert and oriented x3, cranial nerves II-XII grossly intact.  RADIOLOGY:  Chest x-ray showing no acute cardiopulmonary findings.  EKG showing sinus rhythm at a rate of 95 beats per minute.  There is poor R- wave progression throughout.  There is inferior Q-waves noted.  There are no acute ischemic changes.  LABORATORY DATA:  WBC 6.2, hemoglobin 12.3, hematocrit 35.4, platelet 181,000.  Sodium 133, potassium 4.4, BUN 32, creatinine 2.2, glucose greater than 700, lipase 78.  Cardiac enzymes negative x1.  ASSESSMENT/PLAN:  This is a 50 year old African American gentleman with known coronary artery disease status post coronary artery bypass grafting in 2006 as well as uncontrolled diabetes  mellitus who presents with chest pain that is worrisome for unstable angina.  His most recent Myoview was in November 2011 that showed questionable ischemia as well as a depressed ejection fraction of 46%.  The patient is also with acute on chronic renal insufficiency with a creatinine of 2.2.  Our recommendation would be for repeat cardiac catheterization. Prior to proceeding with catheterization, the patient's initial set of enzymes are negative.  EKG is without acute changes.  The patient needs rehydration as well as control of glucose.  This would be per primary team.  With improvement in his renal function, we will plan for cardiac catheterization on Monday.  If the patient has chest pain with acute EKG changes, the patient  will be taken emergently to the cath lab.  We will continue to monitor the patient closely. Thank you for this evaluation.     Cecille Amsterdam, PA-C   ______________________________ Ludwig Lean Doreatha Lew, M.D.    NB/MEDQ  D:  12/01/2010  T:  12/02/2010  Job:  [id number]  Electronically Signed by Pennie Rushing P.A. on 12/13/2010 10:46:52 AM Electronically Signed by Romeo Apple M.D. on 12/20/2010 11:12:58 AM

## 2010-12-21 ENCOUNTER — Encounter: Payer: Self-pay | Admitting: Cardiology

## 2010-12-21 NOTE — Progress Notes (Signed)
Subjective:   Mr. Crisman is seen in the office today as a post hospital visit. He was admitted to [facility] on March 23 with chest pain and uncontrolled diabetes. He had acute renal failure and dyslipidemia. Because of the comorbid conditions, no catheterization was performed. Diabetes was brought under further control and has been under the direction of Dr. Janett Billow Copland for primary care.  He had a history of inferior myocardial infarction in 1999 and had angioplasty of the right coronary artery. 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 It appears that he had a right internal mammary artery graft to the right coronary artery. He subsequently was noted to have ST segment elevation and was noted to have bridging of the left anterior descending. He had stents placed in a long segment of the left anterior descending and it would appear that for 5 days after the procedure, he had an acute stent thrombosis resolving and chronic occlusion of the LAD as it curves around the apex. He had repeat cardiac catheterization in 2007 which shows a patent right internal mammary artery graft, chronic occlusion of the distal left anterior descending following the terminal portions of the previously placed stent. He's not had further workup since that time.  His other problems include hypertension, diabetes, dyslipidemia and a questionable stress study in November of 2011 with a small focus of inducible ischemia in the distal inferior and apical portions of the heart. Overall, at that time, it was felt to be a low risk study. However, he is continued to have chest discomfort. A 2-D echocardiogram done in March 2012, showed ejection fraction of 55-60% the pattern of mild LVH.  Current Outpatient Prescriptions  Medication Sig Dispense Refill  . acetaminophen (TYLENOL) 500 MG tablet Take 500 mg by mouth every 6 (six) hours as needed. 2  tablets bid as needed       . amLODipine (NORVASC) 10 MG tablet Take 10 mg by mouth daily.        Marland Kitchen aspirin 325 MG tablet Take 325 mg by mouth daily.        . carvedilol (COREG) 25 MG tablet Take 25 mg by mouth 2 (two) times daily with a meal.        . insulin glargine (LANTUS) 100 UNIT/ML injection Inject 38 Units into the skin at bedtime.        . isosorbide mononitrate (IMDUR) 60 MG 24 hr tablet Take 60 mg by mouth daily.        Marland Kitchen losartan (COZAAR) 100 MG tablet Take 100 mg by mouth daily.        . nitroGLYCERIN (NITROSTAT) 0.4 MG SL tablet Place 0.4 mg under the tongue every 5 (five) minutes as needed.        . zolpidem (AMBIEN) 10 MG tablet Take 10 mg by mouth at bedtime.        Marland Kitchen atorvastatin (LIPITOR) 40 MG tablet Take 40 mg by mouth daily.        Marland Kitchen glimepiride (AMARYL) 2 MG tablet Take 2 mg by mouth daily before breakfast.        . metFORMIN (GLUCOPHAGE) 500 MG tablet Take 1 tablet (500 mg total) by mouth 2 (two) times daily with a meal.  60 tablet    . pravastatin (PRAVACHOL) 40 MG tablet Take 1 tablet (40 mg total) by mouth every evening.  30 tablet    . vitamin  E 1000 UNIT capsule Take 1,000 Units by mouth 2 (two) times daily.          Allergies  Allergen Reactions  . Ciprocin-Fluocin-Procin (Fluocinolone Acetonide)   . Glimepiride (Amaryl)     Elevates liver function     Patient Active Problem List  Diagnoses  . Chest pain  . Renal failure, acute  . Diabetes mellitus  . Dyslipidemia  . Coronary artery disease  . Acute MI, inferior wall    History  Smoking status  . Never Smoker   Smokeless tobacco  . Not on file    History  Alcohol Use No    No family history on file.  Review of Systems:   The patient denies any heat or cold intolerance.  No weight gain or weight loss.  The patient denies headaches or blurry vision.  There is no cough or sputum production.  The patient denies dizziness.  There is no hematuria or hematochezia.  The patient denies any muscle  aches or arthritis.  The patient denies any rash.  The patient denies frequent falling or instability.  There is no history of depression or anxiety.  All other systems were reviewed and are negative.   Physical Exam:   He's a pleasant white male in no acute distress. His weight is 225. Blood pressure is 140/98, heart rates 82.The head is normocephalic and atraumatic.  Pupils are equally round and reactive to light.  Sclerae nonicteric.  Conjunctiva is clear.  Oropharynx is unremarkable.  There's adequate oral airway.  Neck is supple there are no masses.  Thyroid is not enlarged.  There is no lymphadenopathy.  Lungs are clear.  Chest is symmetric.  Heart shows a regular rate and rhythm.  S1 and S2 are normal.  There is no murmur click or gallop.  Abdomen is soft normal bowel sounds.  There is no organomegaly.  Genital and rectal deferred.  Extremities are without edema.  Peripheral pulses are adequate.  Neurologically intact.  Full range of motion.  The patient is not depressed.  Skin is warm and dry.  Assessment / Plan:

## 2010-12-21 NOTE — Assessment & Plan Note (Signed)
He continues to have substernal chest discomfort. He has multiple cardiovascular risk factors and recurrent discomfort.   I think we need to consider the possibility of cardiac catheterization.  It would appear that his chest discomfort is a relatively chronic feature but is still needs to be defined. He is interested in a radial artery catheter if done.

## 2010-12-21 NOTE — Assessment & Plan Note (Signed)
We need records from Dr. Edilia Bo regarding management of his blood sugars.

## 2010-12-21 NOTE — Assessment & Plan Note (Signed)
We need further followup to see if his blood sugars have been stabilized. We'll try to get records from Dr. Edilia Bo.

## 2010-12-21 NOTE — Assessment & Plan Note (Signed)
We need to get updated records of his lipids and creatinine to see whether cardiac catheterization with a possible.

## 2010-12-27 ENCOUNTER — Ambulatory Visit (INDEPENDENT_AMBULATORY_CARE_PROVIDER_SITE_OTHER): Payer: Medicare Other | Admitting: Cardiology

## 2010-12-27 ENCOUNTER — Other Ambulatory Visit: Payer: Self-pay | Admitting: *Deleted

## 2010-12-27 ENCOUNTER — Encounter: Payer: Self-pay | Admitting: Cardiology

## 2010-12-27 DIAGNOSIS — R079 Chest pain, unspecified: Secondary | ICD-10-CM

## 2010-12-27 NOTE — Progress Notes (Signed)
Subjective:   [name] comes in today for one-week followup. As his blood sugar is better controlled, he is having less problems with chest discomfort, shortness of breath, or rectal dysfunction. His energy level is better. He did see blood sugars are still in the 220 range the knees beginning to be able to be a little bit more active without chest symptoms.he has a history of coronary artery disease with previous inferior myocardial infarction anomalous coronary artery and a RIMA graft to the mid right coronary artery was occluded stents in the left anterior descending. He's not had a repeat cardiac catheterization since 2006. When he was hospitalized on March of 2012, his diabetes was uncontrolled with blood sugars over 600 and he had mild acute renal insufficiency. Overall, he is improving.  Current Outpatient Prescriptions  Medication Sig Dispense Refill  . acetaminophen (TYLENOL) 500 MG tablet Take 500 mg by mouth every 6 (six) hours as needed. 2 tablets bid as needed       . amLODipine (NORVASC) 10 MG tablet Take 10 mg by mouth daily.        Marland Kitchen aspirin 325 MG tablet Take 325 mg by mouth daily.        . carvedilol (COREG) 25 MG tablet Take 25 mg by mouth 2 (two) times daily with a meal.        . insulin glargine (LANTUS) 100 UNIT/ML injection Inject 38 Units into the skin at bedtime.        . isosorbide mononitrate (IMDUR) 60 MG 24 hr tablet Take 60 mg by mouth daily.        Marland Kitchen losartan (COZAAR) 100 MG tablet Take 50 mg by mouth daily.       . metFORMIN (GLUCOPHAGE) 500 MG tablet Take 1,000 mg by mouth. 500 mg in the morning and 1000 mg in the evening  60 tablet    . nitroGLYCERIN (NITROSTAT) 0.4 MG SL tablet Place 0.4 mg under the tongue every 5 (five) minutes as needed.        . pravastatin (PRAVACHOL) 40 MG tablet Take 1 tablet (40 mg total) by mouth every evening.  30 tablet    . vitamin E 1000 UNIT capsule Take 1,000 Units by mouth 2 (two) times daily.        Marland Kitchen zolpidem (AMBIEN) 10 MG  tablet Take 10 mg by mouth at bedtime.        Marland Kitchen DISCONTD: atorvastatin (LIPITOR) 40 MG tablet Take 40 mg by mouth daily.        Marland Kitchen DISCONTD: glimepiride (AMARYL) 2 MG tablet Take 2 mg by mouth daily before breakfast.          Allergies  Allergen Reactions  . Biaxin   . Ciprocin-Fluocin-Procin (Fluocinolone Acetonide)   . Glimepiride (Amaryl)     Elevates liver function     Patient Active Problem List  Diagnoses  . Chest pain  . Renal failure, acute  . Dyslipidemia  . Coronary artery disease  . Acute MI, inferior wall  . Diabetes mellitus    History  Smoking status  . Never Smoker   Smokeless tobacco  . Never Used    History  Alcohol Use No    Family History  Problem Relation Age of Onset  . Cancer Mother   . Diabetes Father   . Heart disease Father   . Anemia Mother   . Heart attack Father   . Heart failure Father   . Hyperlipidemia Father   .  Hypertension Father   . Kidney disease Mother   . Sudden death Father   . Kidney failure Sister 5  . Heart attack Sister   . Kidney disease Sister     Review of Systems:   The patient denies any heat or cold intolerance.  No weight gain or weight loss.  The patient denies headaches or blurry vision.  There is no cough or sputum production.  The patient denies dizziness.  There is no hematuria or hematochezia.  The patient denies any muscle aches or arthritis.  The patient denies any rash.  The patient denies frequent falling or instability.  There is no history of depression or anxiety.  All other systems were reviewed and are negative.   Physical Exam:   Vital signs are reviewed. He's a pleasant but moderately obese black male.The head is normocephalic and atraumatic.  Pupils are equally round and reactive to light.  Sclerae nonicteric.  Conjunctiva is clear.  Oropharynx is unremarkable.  There's adequate oral airway.  Neck is supple there are no masses.  Thyroid is not enlarged.  There is no lymphadenopathy.  Lungs are  clear.  Chest is symmetric.  Heart shows a regular rate and rhythm.  S1 and S2 are normal.  There is no murmur click or gallop.  Abdomen is soft normal bowel sounds.  There is no organomegaly.  Genital and rectal deferred.  Extremities are without edema.  Peripheral pulses are adequate.  Neurologically intact.  Full range of motion.  The patient is not depressed.  Skin is warm and dry.  Assessment / Plan:

## 2010-12-27 NOTE — Assessment & Plan Note (Signed)
He clearly is better since he has had better blood sugar control. I will allow him to work on his diabetes over the next month and we'll see him back again. If he continues to have substernal chest discomfort, we could consider catheterization at that point in time.

## 2010-12-27 NOTE — Assessment & Plan Note (Signed)
Will review his records from the hospital over the next week. If renal function is satisfactory, we'll make arrangements for him to be admitted for cardiac catheterization in light of the recurrent chest pain.

## 2011-01-01 ENCOUNTER — Emergency Department (HOSPITAL_COMMUNITY)
Admission: EM | Admit: 2011-01-01 | Discharge: 2011-01-01 | Disposition: A | Discharge status: Home / Self Care | Payer: Medicare Other | Attending: Emergency Medicine | Admitting: Emergency Medicine

## 2011-01-01 DIAGNOSIS — I10 Essential (primary) hypertension: Secondary | ICD-10-CM | POA: Insufficient documentation

## 2011-01-01 DIAGNOSIS — M79609 Pain in unspecified limb: Secondary | ICD-10-CM | POA: Insufficient documentation

## 2011-01-01 DIAGNOSIS — E785 Hyperlipidemia, unspecified: Secondary | ICD-10-CM | POA: Insufficient documentation

## 2011-01-01 DIAGNOSIS — S139XXA Sprain of joints and ligaments of unspecified parts of neck, initial encounter: Secondary | ICD-10-CM | POA: Insufficient documentation

## 2011-01-01 DIAGNOSIS — E119 Type 2 diabetes mellitus without complications: Secondary | ICD-10-CM | POA: Insufficient documentation

## 2011-01-01 DIAGNOSIS — I2510 Atherosclerotic heart disease of native coronary artery without angina pectoris: Secondary | ICD-10-CM | POA: Insufficient documentation

## 2011-01-01 DIAGNOSIS — M542 Cervicalgia: Secondary | ICD-10-CM | POA: Insufficient documentation

## 2011-01-01 DIAGNOSIS — Z951 Presence of aortocoronary bypass graft: Secondary | ICD-10-CM | POA: Insufficient documentation

## 2011-01-25 ENCOUNTER — Ambulatory Visit (INDEPENDENT_AMBULATORY_CARE_PROVIDER_SITE_OTHER): Payer: Medicare Other | Admitting: Cardiology

## 2011-01-25 ENCOUNTER — Encounter: Payer: Self-pay | Admitting: Cardiology

## 2011-01-25 DIAGNOSIS — I2510 Atherosclerotic heart disease of native coronary artery without angina pectoris: Secondary | ICD-10-CM

## 2011-01-25 DIAGNOSIS — E119 Type 2 diabetes mellitus without complications: Secondary | ICD-10-CM

## 2011-01-25 DIAGNOSIS — N179 Acute kidney failure, unspecified: Secondary | ICD-10-CM

## 2011-01-25 NOTE — Assessment & Plan Note (Signed)
He will see Dr. Hassell Done next week. Lab work is checked today and will be forwarded to Dr. Edilia Bo and Dr. Hassell Done.

## 2011-01-25 NOTE — Progress Notes (Signed)
Subjective:   Matthew Rowe is seen today for a followup visit. He thinks his blood sugars have improved somewhat on metformin and he is no longer taking injectable insulins. He is concerned about sexual dysfunction with metformin. He has had some mild anginal symptoms with sexual intercourse and activity is somewhat more pronounced of the last few weeks. He is scheduled for followup with Dr. Hassell Done for evaluation of his kidney status. We had deferred any further evaluation of his ischemic heart disease until his diabetes and renal situation are stabilized. We will be checking renal function and other chemistries today.  He has been on Ranexa in the past and has had a nice result. He is tolerated Ranexa well and has been free of anginal symptoms. Otherwise, he has been doing reasonably well.  Current Outpatient Prescriptions  Medication Sig Dispense Refill  . acetaminophen (TYLENOL) 500 MG tablet Take 500 mg by mouth every 6 (six) hours as needed. 2 tablets bid as needed       . amLODipine (NORVASC) 10 MG tablet Take 10 mg by mouth daily.        Marland Kitchen aspirin 325 MG tablet Take 325 mg by mouth daily.        . carvedilol (COREG) 25 MG tablet Take 25 mg by mouth 2 (two) times daily with a meal.        . isosorbide mononitrate (IMDUR) 60 MG 24 hr tablet Take 60 mg by mouth daily.        Marland Kitchen losartan (COZAAR) 100 MG tablet Take 50 mg by mouth daily.       . metFORMIN (GLUCOPHAGE) 500 MG tablet Take 1,000 mg by mouth. 1000 mg in the morning 8 am, 500 mg at 6 pm, and 500 mg at 11 pm  (if sugar is below 140 at 11 pm do not take 11 o'clock dose if sugar is over 250 at11 pm take 1000 mg dose)  60 tablet    . nitroGLYCERIN (NITROSTAT) 0.4 MG SL tablet Place 0.4 mg under the tongue every 5 (five) minutes as needed.        . pravastatin (PRAVACHOL) 40 MG tablet Take 1 tablet (40 mg total) by mouth every evening.  30 tablet    . vitamin E 1000 UNIT capsule Take 1,000 Units by mouth 2 (two) times daily.        Marland Kitchen zolpidem  (AMBIEN) 10 MG tablet Take 10 mg by mouth at bedtime.        Marland Kitchen DISCONTD: insulin glargine (LANTUS) 100 UNIT/ML injection Inject 38 Units into the skin as directed.         Allergies  Allergen Reactions  . Biaxin   . Ciprocin-Fluocin-Procin (Fluocinolone Acetonide)   . Glimepiride (Amaryl)     Elevates liver function     Patient Active Problem List  Diagnoses  . Chest pain  . Renal failure, acute  . Dyslipidemia  . Coronary artery disease  . Acute MI, inferior wall  . Diabetes mellitus    History  Smoking status  . Never Smoker   Smokeless tobacco  . Never Used    History  Alcohol Use No    Family History  Problem Relation Age of Onset  . Cancer Mother   . Diabetes Father   . Heart disease Father   . Anemia Mother   . Heart attack Father   . Heart failure Father   . Hyperlipidemia Father   . Hypertension Father   . Kidney disease Mother   .  Sudden death Father   . Kidney failure Sister 5  . Heart attack Sister   . Kidney disease Sister     Review of Systems:   The patient denies any heat or cold intolerance.  No weight gain or weight loss.  The patient denies headaches or blurry vision.  There is no cough or sputum production.  The patient denies dizziness.  There is no hematuria or hematochezia.  The patient denies any muscle aches or arthritis.  The patient denies any rash.  The patient denies frequent falling or instability.  There is no history of depression or anxiety.  All other systems were reviewed and are negative.   Physical Exam:   Vital signs are reviewed.The head is normocephalic and atraumatic.  Pupils are equally round and reactive to light.  Sclerae nonicteric.  Conjunctiva is clear.  Oropharynx is unremarkable.  There's adequate oral airway.  Neck is supple there are no masses.  Thyroid is not enlarged.  There is no lymphadenopathy.  Lungs are clear.  Chest is symmetric.  Heart shows a regular rate and rhythm.  S1 and S2 are normal.  There is no  murmur click or gallop.  Abdomen is soft normal bowel sounds.  There is no organomegaly.  Genital and rectal deferred.  Extremities are without edema.  Peripheral pulses are adequate.  Neurologically intact.  Full range of motion.  The patient is not depressed.  Skin is warm and dry.  Assessment / Plan:

## 2011-01-25 NOTE — Assessment & Plan Note (Addendum)
We'll start Ranexa 500 mg b.i.d. He'll be seen back in 4 weeks. Hopefully he'll continue to have some improvement of his diabetes and if he is clinically asymptomatic, we will try to manage him with ongoing medical regimen. He did have a low risk stress Myoview in November of 2011. However, he has had continued symptoms but it is been in the face of poor management of diabetes and other risk factors.

## 2011-01-25 NOTE — Assessment & Plan Note (Signed)
By his history, diabetes control has improved but I expect is not ideal. Lab work is drawn today and will be forwarded to Dr. Edilia Bo. I did not check a hemoglobin A1c

## 2011-01-26 ENCOUNTER — Other Ambulatory Visit (INDEPENDENT_AMBULATORY_CARE_PROVIDER_SITE_OTHER): Payer: Medicare Other | Admitting: *Deleted

## 2011-01-29 ENCOUNTER — Telehealth: Payer: Self-pay | Admitting: Cardiology

## 2011-01-29 ENCOUNTER — Other Ambulatory Visit (INDEPENDENT_AMBULATORY_CARE_PROVIDER_SITE_OTHER): Payer: Medicare Other | Admitting: *Deleted

## 2011-01-29 DIAGNOSIS — E7800 Pure hypercholesterolemia, unspecified: Secondary | ICD-10-CM

## 2011-01-29 LAB — CBC WITH DIFFERENTIAL/PLATELET
Basophils Relative: 1 % (ref 0.0–3.0)
Eosinophils Relative: 1.5 % (ref 0.0–5.0)
HCT: 37.3 % — ABNORMAL LOW (ref 39.0–52.0)
Hemoglobin: 12.8 g/dL — ABNORMAL LOW (ref 13.0–17.0)
Lymphs Abs: 1.7 10*3/uL (ref 0.7–4.0)
MCV: 88.2 fl (ref 78.0–100.0)
Monocytes Absolute: 0.6 10*3/uL (ref 0.1–1.0)
Monocytes Relative: 9.9 % (ref 3.0–12.0)
Platelets: 213 10*3/uL (ref 150.0–400.0)
RBC: 4.24 Mil/uL (ref 4.22–5.81)
WBC: 6.1 10*3/uL (ref 4.5–10.5)

## 2011-01-29 LAB — LIPID PANEL: Total CHOL/HDL Ratio: 4

## 2011-01-29 LAB — HEPATIC FUNCTION PANEL
ALT: 16 U/L (ref 0–53)
AST: 15 U/L (ref 0–37)
Albumin: 4.1 g/dL (ref 3.5–5.2)
Alkaline Phosphatase: 52 U/L (ref 39–117)
Bilirubin, Direct: 0.1 mg/dL (ref 0.0–0.3)
Total Protein: 6.9 g/dL (ref 6.0–8.3)

## 2011-01-29 LAB — BASIC METABOLIC PANEL
CO2: 26 mEq/L (ref 19–32)
Calcium: 9.7 mg/dL (ref 8.4–10.5)
Chloride: 104 mEq/L (ref 96–112)
Glucose, Bld: 137 mg/dL — ABNORMAL HIGH (ref 70–99)
Potassium: 4.8 mEq/L (ref 3.5–5.1)
Sodium: 138 mEq/L (ref 135–145)

## 2011-01-29 NOTE — Telephone Encounter (Signed)
Called wanting to know why he was seeing Cecille Rubin instead of a cardiologist. Advised Dr. Doreatha Lew was retiring and he wanted him to be seen in 2 wks from prior app. He will then be assigned to another Cardiologist.

## 2011-01-29 NOTE — Telephone Encounter (Signed)
Wants to know why he is seeing Cecille Rubin on 6/18. He wants to see md.

## 2011-01-30 ENCOUNTER — Encounter: Payer: Self-pay | Admitting: Nephrology

## 2011-01-30 ENCOUNTER — Telehealth: Payer: Self-pay | Admitting: *Deleted

## 2011-01-30 NOTE — Telephone Encounter (Signed)
Message copied by Alvina Filbert on Tue Jan 30, 2011  2:58 PM ------      Message from: Romeo Apple      Created: Tue Jan 30, 2011  2:13 PM       OK; glucose improving

## 2011-01-30 NOTE — Telephone Encounter (Signed)
Advised to continue to work on diet and continue same dose of medications

## 2011-01-30 NOTE — Progress Notes (Signed)
Advised of labs 

## 2011-01-30 NOTE — Telephone Encounter (Signed)
Labs reported to pt

## 2011-02-26 ENCOUNTER — Encounter: Payer: Self-pay | Admitting: Nurse Practitioner

## 2011-02-26 ENCOUNTER — Ambulatory Visit (INDEPENDENT_AMBULATORY_CARE_PROVIDER_SITE_OTHER): Payer: Medicare Other | Admitting: Nurse Practitioner

## 2011-02-26 DIAGNOSIS — I2510 Atherosclerotic heart disease of native coronary artery without angina pectoris: Secondary | ICD-10-CM

## 2011-02-26 DIAGNOSIS — R079 Chest pain, unspecified: Secondary | ICD-10-CM

## 2011-02-26 DIAGNOSIS — Z01818 Encounter for other preprocedural examination: Secondary | ICD-10-CM

## 2011-02-26 NOTE — Patient Instructions (Addendum)
We are going to arrange for a cardiac catheterization with Dr. Martinique for July 3rd You will need to have your labs done on Wednesday June 27th. You do not need to fast Go to Short Stay on July 3rd at 7am. Your procedure is set up for 9 am. No food or drink after midnight on Monday Someone will need to drive you home You may stay overnight if an angioplasty or stent is performed. Stop your Metformin on Sunday You may take your other medicines with a sip of water   Angiography Angiography is a procedure used to look at the blood vessels (arteries) which carry the blood to different parts of your body. In this procedure a dye is injected through a catheter (a long, hollow tube about the size of a piece of cooked spaghetti) into an artery and x-rays are taken. The x-rays will show if there is a blockage or problem in a blood vessel.  PREPARATION FOR THE PROCEDURE  Let your caregiver know if you have had an allergy to dyes used in x-ray if you have ever had kidney problems or failure.   Do not eat or drink starting from midnight up to the time of the procedure, or as directed.   You may drink enough water to take your medications the morning of the procedure if you were instructed to do so.   You should be at the hospital or outpatient facility where the procedure is to be done at 7am prior to the procedure or as directed.  PROCEDURE: 1. You may be given a medication to help you relax before and during the procedure through an IV in your hand or arm.  2. A local anesthetic to make the area numb may be used before inserting the catheter.  3. You will be prepared for the procedure by washing and shaving the area where the catheter will be inserted. This is usually done in the groin but may be done in the fold of your arm by your elbow.  4. A specially trained doctor will insert the catheter with a guide wire into an artery. This is guided under a special type of x-ray (fluoroscopy) to the blood  vessel being examined.  5. Special dye is then injected and x-rays are taken. These will show where any narrowing or blockages are located.  AFTER THE PROCEDURE  After the procedure you will be kept in bed for several hours.   The access site will be watched and you will be checked frequently.   Blood tests, other x-rays and an EKG may be done.   You may stay in the hospital overnight for observation.  SEEK IMMEDIATE MEDICAL CARE IF:  You develop chest pain, shortness of breath, feel faint, or pass out.   There is bleeding, swelling, or drainage from the catheter insertion site.   You develop pain, discoloration, coldness, or severe bruising in the leg or arm, or area where the catheter was inserted.

## 2011-02-26 NOTE — Progress Notes (Signed)
Matthew Kirks Sr. Date of Birth: Aug 26, 1961   History of Present Illness: Mr. Westmeyer is seen today for his follow up visit. He is subsequently seen with Dr. Martinique. He is a former patient of Dr. Susa Simmonds. He has a long history of ischemic heart disease with remote inferior MI in 1999 and PCI to the RCA. He has an abnormal origin of the RCA going between the aortic root and pulmonary artery. He subsequently underwent CABG in 2006 in [place]. He has had stents to the LAD and has had prior acute stent thrombosis and a chronically occluded LAD as it curves around the apex. He has not been cathed since 2007. His stress test in November of 2011 suggested inducible ischemia in the distal inferior and apical portions of the heart. He continues to have exertional chest pains and uses NTG on a regular basis. Ranexa was added at his last visit and he sees no difference in his pain. He only has discomfort with exertion. It resolves with rest and NTG. His renal function is now normal. He had a satisfactory visit with Dr. Hassell Done. Overall plan was to proceed on with cardiac cath.   Current Outpatient Prescriptions on File Prior to Visit  Medication Sig Dispense Refill  . acetaminophen (TYLENOL) 500 MG tablet Take 500 mg by mouth every 6 (six) hours as needed. 2 tablets bid as needed       . amLODipine (NORVASC) 10 MG tablet Take 10 mg by mouth daily.        Marland Kitchen aspirin 325 MG tablet Take 325 mg by mouth daily.        . carvedilol (COREG) 25 MG tablet Take 25 mg by mouth 2 (two) times daily with a meal.        . isosorbide mononitrate (IMDUR) 60 MG 24 hr tablet Take 60 mg by mouth daily.        Marland Kitchen losartan (COZAAR) 100 MG tablet Take 50 mg by mouth daily.       . metFORMIN (GLUCOPHAGE) 500 MG tablet Take 1,000 mg by mouth. 1000 mg in the morning 8 am, 500 mg at 6 pm, and 500 mg at 11 pm  (if sugar is below 140 at 11 pm do not take 11 o'clock dose if sugar is over 250 at11 pm take 1000 mg dose)  60 tablet    .  nitroGLYCERIN (NITROSTAT) 0.4 MG SL tablet Place 0.4 mg under the tongue every 5 (five) minutes as needed.        . pravastatin (PRAVACHOL) 40 MG tablet Take 1 tablet (40 mg total) by mouth every evening.  30 tablet    . vitamin E 1000 UNIT capsule Take 1,000 Units by mouth 2 (two) times daily.        Marland Kitchen zolpidem (AMBIEN) 10 MG tablet Take 10 mg by mouth at bedtime.          Allergies  Allergen Reactions  . Biaxin   . Ciprocin-Fluocin-Procin (Fluocinolone Acetonide)   . Glimepiride (Amaryl)     Elevates liver function     Past Medical History  Diagnosis Date  . Chest pain   . Renal failure, acute   . Diabetes mellitus   . Dyslipidemia   . Coronary artery disease   . Acute MI, inferior wall 1999  . Hx of CABG   . Abnormal stress test November 2011    Past Surgical History  Procedure Date  . Cystectomy     off face  .  Cardiac catheterization 1999 / 2002 / 2004 / 2004    with stent / at least 4 surgeries  . Coronary artery bypass graft 2006    History  Smoking status  . Never Smoker   Smokeless tobacco  . Never Used    History  Alcohol Use No    Family History  Problem Relation Age of Onset  . Cancer Mother   . Diabetes Father   . Heart disease Father   . Anemia Mother   . Heart attack Father   . Heart failure Father   . Hyperlipidemia Father   . Hypertension Father   . Kidney disease Mother   . Sudden death Father   . Kidney failure Sister 5  . Heart attack Sister   . Kidney disease Sister     Review of Systems: The review of systems is positive for exertional chest pain. He gets a better response from NTG spray. He has no rest symptoms at rest. He is not able to exercise. He does have some DOE.  He was told that his kidneys were good by Dr. Hassell Done. All other systems were reviewed and are negative.  Physical Exam: BP 148/100  Pulse 80  Wt 227 lb (102.967 kg) Patient is alert and in no acute distress. Skin is warm and dry. He is obese. Color is normal.   HEENT is unremarkable. Normocephalic/atraumatic. PERRL. Sclera are nonicteric. Neck is supple. No masses. No JVD. Lungs are clear. Cardiac exam shows a regular rate and rhythm. Heart tones are distant. Abdomen is obese and soft. Extremities are without edema. Gait and ROM are intact. No gross neurologic deficits noted.  LABORATORY DATA: Last renal function study from last month was normal.    Assessment / Plan:

## 2011-02-26 NOTE — Assessment & Plan Note (Signed)
He continue to have exertional chest pain and continues on NTG. He has noticed no change with the Ranexa. He needs to proceed with cardiac cath. He is subsequently seen with Dr. Martinique. We will make arrangements for his cath on July 3rd. The procedure was reviewed and he is willing to proceed. No changes in his medicines today were made. Patient is agreeable to this plan and will call if any problems develop in the interim.

## 2011-03-07 ENCOUNTER — Other Ambulatory Visit (INDEPENDENT_AMBULATORY_CARE_PROVIDER_SITE_OTHER): Payer: Medicare Other | Admitting: *Deleted

## 2011-03-07 DIAGNOSIS — R079 Chest pain, unspecified: Secondary | ICD-10-CM

## 2011-03-07 DIAGNOSIS — Z01818 Encounter for other preprocedural examination: Secondary | ICD-10-CM

## 2011-03-07 DIAGNOSIS — R0789 Other chest pain: Secondary | ICD-10-CM

## 2011-03-07 LAB — CBC WITH DIFFERENTIAL/PLATELET
Basophils Absolute: 0 10*3/uL (ref 0.0–0.1)
Basophils Relative: 0.5 % (ref 0.0–3.0)
Eosinophils Absolute: 0.1 10*3/uL (ref 0.0–0.7)
Eosinophils Relative: 1.3 % (ref 0.0–5.0)
HCT: 39.2 % (ref 39.0–52.0)
Hemoglobin: 13.4 g/dL (ref 13.0–17.0)
Lymphocytes Relative: 26.5 % (ref 12.0–46.0)
Lymphs Abs: 2 10*3/uL (ref 0.7–4.0)
MCHC: 34.1 g/dL (ref 30.0–36.0)
MCV: 88.3 fl (ref 78.0–100.0)
Monocytes Absolute: 0.7 10*3/uL (ref 0.1–1.0)
Monocytes Relative: 9.5 % (ref 3.0–12.0)
Neutro Abs: 4.6 10*3/uL (ref 1.4–7.7)
Neutrophils Relative %: 62.2 % (ref 43.0–77.0)
Platelets: 229 10*3/uL (ref 150.0–400.0)
RBC: 4.44 Mil/uL (ref 4.22–5.81)
RDW: 13.4 % (ref 11.5–14.6)
WBC: 7.4 10*3/uL (ref 4.5–10.5)

## 2011-03-07 LAB — BASIC METABOLIC PANEL
BUN: 16 mg/dL (ref 6–23)
CO2: 26 mEq/L (ref 19–32)
Calcium: 10 mg/dL (ref 8.4–10.5)
Chloride: 111 mEq/L (ref 96–112)
Creatinine, Ser: 1.3 mg/dL (ref 0.4–1.5)
GFR: 74.79 mL/min (ref >60.00)
Glucose, Bld: 130 mg/dL — ABNORMAL HIGH (ref 70–99)
Potassium: 4.5 mEq/L (ref 3.5–5.1)
Sodium: 143 mEq/L (ref 135–145)

## 2011-03-07 LAB — PROTIME-INR
INR: 0.9 ratio (ref 0.8–1.0)
Prothrombin Time: 10.5 s (ref 10.2–12.4)

## 2011-03-07 LAB — APTT: aPTT: 28.1 s (ref 21.7–28.8)

## 2011-03-08 ENCOUNTER — Telehealth: Payer: Self-pay | Admitting: *Deleted

## 2011-03-08 NOTE — Telephone Encounter (Signed)
Notified of lab results. OK for cath 7/3.

## 2011-03-08 NOTE — Telephone Encounter (Signed)
Message copied by Hetty Blend on Thu Mar 08, 2011 10:06 AM ------      Message from: Burtis Junes      Created: Wed Mar 07, 2011  3:21 PM       Ok to report. Labs are satisfactory. Ok to proceed with cath next week.

## 2011-03-09 ENCOUNTER — Telehealth: Payer: Self-pay | Admitting: Cardiology

## 2011-03-09 NOTE — Telephone Encounter (Signed)
PT WANTS TO SW ANITA ABOUT HIS CATH THAT IS SCHEDULED FOR NEXT WEEK.

## 2011-03-13 ENCOUNTER — Other Ambulatory Visit: Payer: Self-pay | Admitting: *Deleted

## 2011-03-13 ENCOUNTER — Ambulatory Visit (HOSPITAL_COMMUNITY)
Admission: RE | Admit: 2011-03-13 | Discharge: 2011-03-13 | Disposition: A | Discharge status: Home / Self Care | Payer: Medicare Other | Source: Ambulatory Visit | Attending: Cardiology | Admitting: Cardiology

## 2011-03-13 DIAGNOSIS — Q245 Malformation of coronary vessels: Secondary | ICD-10-CM | POA: Insufficient documentation

## 2011-03-13 DIAGNOSIS — I2510 Atherosclerotic heart disease of native coronary artery without angina pectoris: Secondary | ICD-10-CM

## 2011-03-13 DIAGNOSIS — Z0184 Encounter for antibody response examination: Secondary | ICD-10-CM | POA: Insufficient documentation

## 2011-03-13 DIAGNOSIS — I10 Essential (primary) hypertension: Secondary | ICD-10-CM

## 2011-03-13 DIAGNOSIS — I209 Angina pectoris, unspecified: Secondary | ICD-10-CM | POA: Insufficient documentation

## 2011-03-16 ENCOUNTER — Encounter: Payer: Self-pay | Admitting: Nurse Practitioner

## 2011-03-19 NOTE — Cardiovascular Report (Signed)
Matthew Rowe, Matthew Rowe NO.:  [id number]  MEDICAL RECORD NO.:  [id number]  LOCATION:  [facility]                         FACILITY:  [facility]  PHYSICIAN:  Matthew Rowe, M.D.  DATE OF BIRTH:  1961/01/22  DATE OF PROCEDURE:  03/13/2011 DATE OF DISCHARGE:  03/13/2011                           CARDIAC CATHETERIZATION   INDICATIONS FOR PROCEDURE:  This is a 50 year old African American male with complex history of coronary disease.  He has a history of remote inferior myocardial infarction in 1999 and had percutaneous intervention of the right coronary artery.  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  He underwent bypass surgery with right internal mammary artery graft to the right coronary in 2006.  Prior to this, he had also had extensive stenting of the LAD with subsequent stent occlusion distally.  The patient presents now with symptoms of exertional angina.  PROCEDURE:  Left heart catheterization, coronary and right internal mammary artery graft angiography.  SURGEON:  Matthew Veenstra M. Martinique, MD  ACCESS:  Via the right femoral artery using the standard Seldinger technique.  EQUIPMENT:  5-French 4-cm left Judkins catheter, 5-French pigtail catheter, 5-French left 3.0 guide, and a IMA catheter.  MEDICATIONS:  Local anesthesia with 1% Xylocaine, Versed 2 mg IV, and fentanyl 50 mcg IV.  CONTRAST:  Due to difficulty engaging the right coronary and the RIMA, 160 mL of contrast was used.  HEMODYNAMIC DATA:  Aortic pressure was 171/102 with a mean of 132 mmHg. Left ventricle pressure was 165 with an EDP of 27 mmHg.  ANGIOGRAPHIC DATA:  The left coronary artery arises and distributes normally.  The left main coronary has 20% narrowing distally.  The LAD extends out around the apex.  There is a stent in the proximal vessel followed by fairly normal-appearing segment and then extensive stenting of the mid-to-distal  LAD.  The proximal stent in the LAD is widely patent.  Within the stented segment in the mid-to-distal LAD, there is focal 60-70% narrowing in the very proximal portion of the stent.  The distal portion of the stent has subtotal occlusion with very faint flow wrapping around the apex.  The left circumflex coronary artery has only minor irregularities. There is a moderate-sized intermediate branch which has 30% narrowing.  The right coronary artery has an anomalous takeoff from the left coronary cusp slightly superior to the left main coronary artery.  This was engaged with a left 3.0 guide.  This demonstrated the right coronary was without significant obstructive disease.  There was scattered 30% disease in the mid and distal vessel.  The right internal mammary artery graft to the right coronary was directly engaged with IMA catheter and demonstrated that it was patent to the right coronary artery.  FINAL INTERPRETATION: 1. Single-vessel obstructive atherosclerotic coronary artery disease     involving the distal left anterior descending. 2. 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 3. Patent right internal mammary artery graft to the right coronary     artery.  PLAN:  We will review his treatment options, but at this point I would recommend continued  medical therapy.          ______________________________ Matthew Rowe, M.D.     PMJ/MEDQ  D:  03/13/2011  T:  03/14/2011  Job:  [id number]  cc:   Darreld Mclean, MD Matthew Rowe, M.D.  Electronically Signed by Matthew Rowe M.D. on 03/19/2011 05:35:32 PM

## 2011-03-20 ENCOUNTER — Other Ambulatory Visit (INDEPENDENT_AMBULATORY_CARE_PROVIDER_SITE_OTHER): Payer: Medicare Other | Admitting: *Deleted

## 2011-03-20 DIAGNOSIS — I10 Essential (primary) hypertension: Secondary | ICD-10-CM

## 2011-03-20 LAB — BASIC METABOLIC PANEL
BUN: 20 mg/dL (ref 6–23)
Calcium: 9.9 mg/dL (ref 8.4–10.5)
GFR: 57.6 mL/min — ABNORMAL LOW (ref >60.00)
Glucose, Bld: 162 mg/dL — ABNORMAL HIGH (ref 70–99)
Potassium: 4.5 mEq/L (ref 3.5–5.1)

## 2011-03-22 ENCOUNTER — Telehealth: Payer: Self-pay | Admitting: *Deleted

## 2011-03-22 NOTE — Telephone Encounter (Signed)
Lm w/lab results. Will send copy to Dr. Hassell Done

## 2011-03-22 NOTE — Telephone Encounter (Signed)
Message copied by Hetty Blend on Thu Mar 22, 2011 11:04 AM ------      Message from: Martinique, PETER M      Created: Tue Mar 20, 2011  3:12 PM       Mild increase in creatnine, lytes ok. Cc: Dr. Hassell Done.

## 2011-03-27 ENCOUNTER — Encounter: Payer: Self-pay | Admitting: Nurse Practitioner

## 2011-03-27 ENCOUNTER — Ambulatory Visit (INDEPENDENT_AMBULATORY_CARE_PROVIDER_SITE_OTHER): Payer: Medicare Other | Admitting: Nurse Practitioner

## 2011-03-27 VITALS — BP 148/100 | HR 74 | Ht 65.0 in | Wt 229.8 lb

## 2011-03-27 DIAGNOSIS — I10 Essential (primary) hypertension: Secondary | ICD-10-CM

## 2011-03-27 DIAGNOSIS — I2510 Atherosclerotic heart disease of native coronary artery without angina pectoris: Secondary | ICD-10-CM

## 2011-03-27 DIAGNOSIS — E119 Type 2 diabetes mellitus without complications: Secondary | ICD-10-CM

## 2011-03-27 DIAGNOSIS — I152 Hypertension secondary to endocrine disorders: Secondary | ICD-10-CM | POA: Insufficient documentation

## 2011-03-27 LAB — BASIC METABOLIC PANEL
BUN: 22 mg/dL (ref 6–23)
CO2: 24 mEq/L (ref 19–32)
Calcium: 9.4 mg/dL (ref 8.4–10.5)
Chloride: 108 mEq/L (ref 96–112)
Creatinine, Ser: 1.6 mg/dL — ABNORMAL HIGH (ref 0.4–1.5)
GFR: 58.42 mL/min — ABNORMAL LOW (ref >60.00)
Glucose, Bld: 161 mg/dL — ABNORMAL HIGH (ref 70–99)
Potassium: 4.3 mEq/L (ref 3.5–5.1)
Sodium: 141 mEq/L (ref 135–145)

## 2011-03-27 MED ORDER — NITROGLYCERIN 0.4 MG/SPRAY TL SOLN: 1.0000 | Status: DC | PRN

## 2011-03-27 MED ORDER — HYDROCHLOROTHIAZIDE 25 MG PO TABS: 25.0000 mg | ORAL_TABLET | Freq: Every day | ORAL | Status: DC

## 2011-03-27 NOTE — Assessment & Plan Note (Signed)
He is on Metformin. He feels this is the drug responsible for his ED. I will defer to PCP. Repeat BMET will be checked today.

## 2011-03-27 NOTE — Assessment & Plan Note (Addendum)
His chest pain is at baseline. No changes in his medicines except I have sent a RX for NTG spray per his request. Will start assistance forms for Ranexa. I have spoken with Dr. Martinique. He does not feel that Plavix would be beneficial in Matthew Rowe case.

## 2011-03-27 NOTE — Patient Instructions (Addendum)
You may try the nitroglycerin spray. I sent a prescription to the drug store. We will start the paperwork for your Ranexa We need to get your blood pressure down. We are going to add a low dose diuretic HCTZ at 25 mg daily.  Try to monitor your blood pressure at home. I will see you back in about 2 weeks. Talk to your diabetic doctor about your metformin and the sexual side effects We are going to check your kidney function Call for any problems.

## 2011-03-27 NOTE — Assessment & Plan Note (Signed)
Blood pressure is not controlled. I have added HCTZ 25 mg daily. Will see him back in 2 weeks.

## 2011-03-27 NOTE — Progress Notes (Signed)
Karie Kirks Date of Birth: Mar 01, 1961   History of Present Illness: Mr. Lannigan is seen back today for a post cath visit. He is seen for Dr. Martinique. He is a former patient of Dr. Susa Simmonds. He had his cath with no adverse effects. Creatinine last week was 1.6. We will recheck today. His chest pain is stable. His cath shows single vessel obstructive disease involving the distal LAD. He is felt to best be managed medically. He uses NTG if he really overexerts. Blood pressure remains up. He does not check it at home. He says he is tolerating his medicines. Ranexa will be difficult for him to afford. We will start assistance forms. He is complaining of sexual dysfunction and says it is related to Metformin. The days that he was off for his procedure, he noted no sexual problems. With his renal insufficiency, he may not need to be on Metformin. He had no issues with his cath site (right groin).   Current Outpatient Prescriptions on File Prior to Visit  Medication Sig Dispense Refill  . acetaminophen (TYLENOL) 500 MG tablet Take 500 mg by mouth every 6 (six) hours as needed. 2 tablets bid as needed       . amLODipine (NORVASC) 10 MG tablet Take 10 mg by mouth daily.        Marland Kitchen aspirin 325 MG tablet Take 325 mg by mouth daily.        . carvedilol (COREG) 25 MG tablet Take 25 mg by mouth 2 (two) times daily with a meal.        . isosorbide mononitrate (IMDUR) 60 MG 24 hr tablet Take 60 mg by mouth daily.        Marland Kitchen losartan (COZAAR) 100 MG tablet Take 50 mg by mouth daily.       . metFORMIN (GLUCOPHAGE) 500 MG tablet Take 1,000 mg by mouth. 1000 mg in the morning 8 am, 500 mg at 6 pm, and 500 mg at 11 pm  (if sugar is below 140 at 11 pm do not take 11 o'clock dose if sugar is over 250 at11 pm take 1000 mg dose)  60 tablet    . nitroGLYCERIN (NITROSTAT) 0.4 MG SL tablet Place 0.4 mg under the tongue every 5 (five) minutes as needed.        . pravastatin (PRAVACHOL) 40 MG tablet Take 1 tablet (40 mg  total) by mouth every evening.  30 tablet    . ranolazine (RANEXA) 500 MG 12 hr tablet Take 500 mg by mouth 2 (two) times daily.        . vitamin E 1000 UNIT capsule Take 1,000 Units by mouth 2 (two) times daily.        Marland Kitchen zolpidem (AMBIEN) 10 MG tablet Take 10 mg by mouth at bedtime.        . hydrochlorothiazide 25 MG tablet Take 1 tablet (25 mg total) by mouth daily.  30 tablet  11    Allergies  Allergen Reactions  . Biaxin   . Ciprocin-Fluocin-Procin (Fluocinolone Acetonide)   . Glimepiride (Amaryl)     Elevates liver function     Past Medical History  Diagnosis Date  . Chest pain   . Renal failure, acute   . Diabetes mellitus   . Dyslipidemia   . Coronary artery disease   . Acute MI, inferior wall 1999  . Hx of CABG   . Abnormal stress test November 2011    Past Surgical History  Procedure Date  . Cystectomy     off face  . Cardiac catheterization 1999 / 2002 / 2004 / 2004    with stent / at least 4 surgeries  . Coronary artery bypass graft 2006    History  Smoking status  . Never Smoker   Smokeless tobacco  . Never Used    History  Alcohol Use No    Family History  Problem Relation Age of Onset  . Cancer Mother   . Diabetes Father   . Heart disease Father   . Anemia Mother   . Heart attack Father   . Heart failure Father   . Hyperlipidemia Father   . Hypertension Father   . Kidney disease Mother   . Sudden death Father   . Kidney failure Sister 5  . Heart attack Sister   . Kidney disease Sister     Review of Systems: The review of systems is positive for stable angina. He has ED. Blood sugars are ok per his report.  All other systems were reviewed and are negative.  Physical Exam: BP 148/100  Pulse 74  Ht 5\' 5"  (1.651 m)  Wt 229 lb 12.8 oz (104.237 kg)  BMI 38.24 kg/m2 Patient is in no acute distress. He is obese. Skin is warm and dry. Color is normal.  HEENT is unremarkable. Normocephalic/atraumatic. PERRL. Sclera are nonicteric. Neck is  supple. No masses. No JVD. Lungs are clear. Cardiac exam shows a regular rate and rhythm. Abdomen is soft. Extremities are without edema. Gait and ROM are intact. No gross neurologic deficits noted.  LABORATORY DATA: PENDING   Assessment / Plan:

## 2011-03-29 ENCOUNTER — Telehealth: Payer: Self-pay | Admitting: *Deleted

## 2011-03-29 NOTE — Telephone Encounter (Signed)
Message copied by Hetty Blend on Thu Mar 29, 2011  5:37 PM ------      Message from: Burtis Junes      Created: Tue Mar 27, 2011  2:41 PM       Ok to report. Still with very mild renal insufficiency. Needs to discuss long term management with Metformin with Dr. Lorelei Pont at Urgent Care

## 2011-03-29 NOTE — Telephone Encounter (Signed)
Notified of lab results. Will send copy to Dr. Lorelei Pont

## 2011-04-09 ENCOUNTER — Encounter: Payer: Self-pay | Admitting: Cardiology

## 2011-04-09 DIAGNOSIS — I2510 Atherosclerotic heart disease of native coronary artery without angina pectoris: Secondary | ICD-10-CM

## 2011-04-10 ENCOUNTER — Ambulatory Visit: Payer: Medicare Other | Admitting: Nurse Practitioner

## 2011-04-12 ENCOUNTER — Ambulatory Visit: Payer: Medicare Other | Admitting: Nurse Practitioner

## 2011-05-01 ENCOUNTER — Encounter: Payer: Self-pay | Admitting: Nurse Practitioner

## 2011-05-01 ENCOUNTER — Ambulatory Visit (INDEPENDENT_AMBULATORY_CARE_PROVIDER_SITE_OTHER): Payer: Medicare Other | Admitting: Nurse Practitioner

## 2011-05-01 DIAGNOSIS — E119 Type 2 diabetes mellitus without complications: Secondary | ICD-10-CM

## 2011-05-01 DIAGNOSIS — I10 Essential (primary) hypertension: Secondary | ICD-10-CM

## 2011-05-01 DIAGNOSIS — N289 Disorder of kidney and ureter, unspecified: Secondary | ICD-10-CM

## 2011-05-01 DIAGNOSIS — R079 Chest pain, unspecified: Secondary | ICD-10-CM

## 2011-05-01 DIAGNOSIS — I2510 Atherosclerotic heart disease of native coronary artery without angina pectoris: Secondary | ICD-10-CM

## 2011-05-01 MED ORDER — RANOLAZINE ER 1000 MG PO TB12: 1000.0000 mg | ORAL_TABLET | Freq: Two times a day (BID) | ORAL | Status: DC

## 2011-05-01 NOTE — Assessment & Plan Note (Signed)
He is having more chest pain. I have increased his Ranexa to 1000 mg BID. Samples are given. He will see Dr. Martinique back in 6 to 8 weeks. He will continue to use NTG sl prn. Patient is agreeable to this plan and will call if any problems develop in the interim.

## 2011-05-01 NOTE — Progress Notes (Signed)
Matthew Rowe Date of Birth: Dec 20, 1960   History of Present Illness: Matthew Rowe is seen back today for a follow up visit. He is seen for Dr. Martinique. He has chronic angina. He has known single vessel distal LAD disease and is managed medically. He says he is having more chest pain. He is under more stress. He gets a good response from NTG sl. He is on good medical therapy but has room to increase both his nitrate and Ranexa. He does not check his blood pressure at home. He did not take any of his medicines today.   Current Outpatient Prescriptions on File Prior to Visit  Medication Sig Dispense Refill  . acetaminophen (TYLENOL) 500 MG tablet Take 500 mg by mouth every 6 (six) hours as needed. 2 tablets bid as needed       . amLODipine (NORVASC) 10 MG tablet Take 10 mg by mouth daily.        Marland Kitchen aspirin 325 MG tablet Take 325 mg by mouth daily.        . carvedilol (COREG) 25 MG tablet Take 25 mg by mouth 2 (two) times daily with a meal.        . isosorbide mononitrate (IMDUR) 60 MG 24 hr tablet Take 60 mg by mouth daily.        Marland Kitchen losartan (COZAAR) 100 MG tablet Take 50 mg by mouth daily.       . metFORMIN (GLUCOPHAGE) 500 MG tablet Take 1,000 mg by mouth. 1000 mg in the morning 8 am, 500 mg at 6 pm, and 500 mg at 11 pm  (if sugar is below 140 at 11 pm do not take 11 o'clock dose if sugar is over 250 at11 pm take 1000 mg dose)  60 tablet    . nitroGLYCERIN (NITROLINGUAL) 0.4 MG/SPRAY spray Place 1 spray under the tongue every 5 (five) minutes as needed for chest pain.  12 g  3  . nitroGLYCERIN (NITROSTAT) 0.4 MG SL tablet Place 0.4 mg under the tongue every 5 (five) minutes as needed.        . pravastatin (PRAVACHOL) 40 MG tablet Take 1 tablet (40 mg total) by mouth every evening.  30 tablet    . zolpidem (AMBIEN) 10 MG tablet Take 10 mg by mouth at bedtime as needed.       Marland Kitchen DISCONTD: hydrochlorothiazide 25 MG tablet Take 1 tablet (25 mg total) by mouth daily.  30 tablet  11  . vitamin E  1000 UNIT capsule Take 1,000 Units by mouth 2 (two) times daily.          Allergies  Allergen Reactions  . Biaxin   . Ciprocin-Fluocin-Procin (Fluocinolone Acetonide)   . Glimepiride (Amaryl)     Elevates liver function     Past Medical History  Diagnosis Date  . Chest pain     Chronic  . Renal failure, acute   . Diabetes mellitus   . Dyslipidemia   . Coronary artery disease     s/p cath in July 2012 showing single vessel distal LAD disease, managed medically  . Acute MI, inferior wall 1999  . Hx of CABG   . Abnormal stress test November 2011    Past Surgical History  Procedure Date  . Cystectomy     off face  . Cardiac catheterization 1999 / 2002 / 2004 / 2004    with stent / at least 4 surgeries  . Coronary artery bypass graft 2006  History  Smoking status  . Never Smoker   Smokeless tobacco  . Never Used    History  Alcohol Use No    Family History  Problem Relation Age of Onset  . Cancer Mother   . Diabetes Father   . Heart disease Father   . Anemia Mother   . Heart attack Father   . Heart failure Father   . Hyperlipidemia Father   . Hypertension Father   . Kidney disease Mother   . Sudden death Father   . Kidney failure Sister 5  . Heart attack Sister   . Kidney disease Sister     Review of Systems: The review of systems is positive for increasing chest pain.  All other systems were reviewed and are negative.  Physical Exam: BP 132/92  Pulse 80  Ht 5' 5.5" (1.664 m)  Wt 229 lb 6.4 oz (104.055 kg)  BMI 37.59 kg/m2 Patient is a morbidly obese black male in no acute distress. Skin is warm and dry. Color is normal.  HEENT is unremarkable. Normocephalic/atraumatic. PERRL. Sclera are nonicteric. Neck is supple. No masses. No JVD. Lungs are clear. Cardiac exam shows a regular rate and rhythm. Abdomen is obese and soft. Extremities are without edema. Gait and ROM are intact. No gross neurologic deficits noted.  LABORATORY DATA:   Assessment  / Plan:

## 2011-05-01 NOTE — Patient Instructions (Addendum)
I would encourage you to get your own blood pressure cuff and monitor at home Stay on your current medicines Continue to use your NTG as needed.  I want you to increase your Ranexa to 1000 mg two times a day and see if this helps with your chest pain  We will see you back in 2 months. You will see Dr. Martinique

## 2011-05-01 NOTE — Assessment & Plan Note (Signed)
No medicines taken today. I encouraged him to monitor at home. For now, no change in his medicines. I tried to stress the importance of risk factor modification and for him to focus on losing weight and exercising. I am not sure he has the motivation to follow through.

## 2011-07-02 ENCOUNTER — Encounter: Payer: Self-pay | Admitting: Cardiology

## 2011-07-02 ENCOUNTER — Ambulatory Visit (INDEPENDENT_AMBULATORY_CARE_PROVIDER_SITE_OTHER): Payer: Medicare Other | Admitting: Cardiology

## 2011-07-02 DIAGNOSIS — I2510 Atherosclerotic heart disease of native coronary artery without angina pectoris: Secondary | ICD-10-CM

## 2011-07-02 DIAGNOSIS — E119 Type 2 diabetes mellitus without complications: Secondary | ICD-10-CM

## 2011-07-02 DIAGNOSIS — I209 Angina pectoris, unspecified: Secondary | ICD-10-CM

## 2011-07-02 DIAGNOSIS — I10 Essential (primary) hypertension: Secondary | ICD-10-CM

## 2011-07-02 NOTE — Patient Instructions (Signed)
Increase Ranexa to 1000 mg twice a day.  Increase isosorbide to twice a day (Imdur)  I will see you again in 2 months.

## 2011-07-02 NOTE — Progress Notes (Signed)
Matthew Rowe Date of Birth: Oct 19, 1960   History of Present Illness: Matthew Rowe is seen  today for a follow up visit.  He has chronic angina. He is status post CABG in 1999 with a RIMA graft to the right coronary. He's had extensive stenting of the LAD in the past with subsequent occlusion of the distal vessel. His last cardiac catheterization was in July of this year. He is managed medically. He says he is having more chest pain. He is under more stress. He reports that he had chest pain starting last night for which he took nitroglycerin, carvedilol, and aspirin. His pain eased and then he awoke again at 4 AM with recurrent chest pain. He took nitroglycerin again with relief. At one point he states his pain is real bad but the next sentence he's asking about his erectile dysfunction. Previously his Ranexa was increased to 1000 mg twice a day but he's only been taking it once a day.  Current Outpatient Prescriptions on File Prior to Visit  Medication Sig Dispense Refill  . acetaminophen (TYLENOL) 500 MG tablet Take 500 mg by mouth every 6 (six) hours as needed. 2 tablets bid as needed       . amLODipine (NORVASC) 10 MG tablet Take 10 mg by mouth daily.        Marland Kitchen aspirin 325 MG tablet Take 325 mg by mouth daily.        . carvedilol (COREG) 25 MG tablet Take 25 mg by mouth 2 (two) times daily with a meal.        . hydrochlorothiazide 25 MG tablet Take 25 mg by mouth as needed.        . isosorbide mononitrate (IMDUR) 60 MG 24 hr tablet Take 60 mg by mouth daily.        Marland Kitchen losartan (COZAAR) 100 MG tablet Take 50 mg by mouth daily.       . metFORMIN (GLUCOPHAGE) 500 MG tablet Take 1,000 mg by mouth. 1000 mg in the morning 8 am, 500 mg at 6 pm, and 500 mg at 11 pm  (if sugar is below 140 at 11 pm do not take 11 o'clock dose if sugar is over 250 at11 pm take 1000 mg dose)  60 tablet    . nitroGLYCERIN (NITROLINGUAL) 0.4 MG/SPRAY spray Place 1 spray under the tongue every 5 (five) minutes as needed  for chest pain.  12 g  3  . nitroGLYCERIN (NITROSTAT) 0.4 MG SL tablet Place 0.4 mg under the tongue every 5 (five) minutes as needed.        . pravastatin (PRAVACHOL) 40 MG tablet Take 1 tablet (40 mg total) by mouth every evening.  30 tablet    . ranolazine (RANEXA) 1000 MG SR tablet Take 1 tablet (1,000 mg total) by mouth 2 (two) times daily.      . vitamin E 1000 UNIT capsule Take 1,000 Units by mouth 2 (two) times daily.        Marland Kitchen zolpidem (AMBIEN) 10 MG tablet Take 10 mg by mouth at bedtime as needed.       Marland Kitchen DISCONTD: hydrochlorothiazide 25 MG tablet Take 1 tablet (25 mg total) by mouth daily.  30 tablet  11    Allergies  Allergen Reactions  . Biaxin   . Ciprocin-Fluocin-Procin (Fluocinolone Acetonide)   . Glimepiride (Amaryl)     Elevates liver function     Past Medical History  Diagnosis Date  . Chest pain  Chronic  . Renal failure, acute   . Diabetes mellitus   . Dyslipidemia   . Coronary artery disease     s/p cath in July 2012 showing single vessel distal LAD disease, managed medically  . Acute MI, inferior wall 1999  . Hx of CABG   . Abnormal stress test November 2011    Past Surgical History  Procedure Date  . Cystectomy     off face  . Cardiac catheterization 1999 / 2002 / 2004 / 2004    with stent / at least 4 surgeries  . Coronary artery bypass graft 2006    History  Smoking status  . Never Smoker   Smokeless tobacco  . Never Used    History  Alcohol Use No    Family History  Problem Relation Age of Onset  . Cancer Mother   . Diabetes Father   . Heart disease Father   . Anemia Mother   . Heart attack Father   . Heart failure Father   . Hyperlipidemia Father   . Hypertension Father   . Kidney disease Mother   . Sudden death Father   . Kidney failure Sister 5  . Heart attack Sister   . Kidney disease Sister     Review of Systems: The review of systems is positive for chest pain.  He has erectile dysfunction. All other systems were  reviewed and are negative.  Physical Exam: BP 132/88  Pulse 89  Ht 5\' 5"  (1.651 m)  Wt 226 lb (102.513 kg)  BMI 37.61 kg/m2 Patient is a morbidly obese black male in no acute distress. Skin is warm and dry. Color is normal.  HEENT is unremarkable. Normocephalic/atraumatic. PERRL. Sclera are nonicteric. Neck is supple. No masses. No JVD. Lungs are clear. Cardiac exam shows a regular rate and rhythm. Abdomen is obese and soft. Extremities are without edema. Gait and ROM are intact. No gross neurologic deficits noted.  LABORATORY DATA: ECG demonstrates normal sinus rhythm with left axis deviation. He has LVH by voltage. There is evidence of an old anterior myocardial infarction. There are no acute changes.  Assessment / Plan:

## 2011-07-02 NOTE — Assessment & Plan Note (Addendum)
Patient has known occlusion of the distal LAD. He has chronic chest pain some of which is anginal and some is not . He is not completely compliant with his medications. I recommended increasing his Ranexa to 1000 mg twice a day. We will see if he qualifies for patient assistance. We will increase his isosorbide to 60 mg twice a day. We will followup again in 3 months. He is scheduled to see urology next week for his erectile dysfunction. He is not a candidate for prostaglandin inhibitors because of his chronic nitrate use.

## 2011-07-09 ENCOUNTER — Emergency Department (HOSPITAL_COMMUNITY): Payer: Medicare Other

## 2011-07-09 ENCOUNTER — Emergency Department (HOSPITAL_COMMUNITY)
Admission: EM | Admit: 2011-07-09 | Discharge: 2011-07-09 | Disposition: A | Discharge status: Home / Self Care | Payer: Medicare Other | Attending: Emergency Medicine | Admitting: Emergency Medicine

## 2011-07-09 ENCOUNTER — Encounter (HOSPITAL_COMMUNITY): Payer: Self-pay | Admitting: Radiology

## 2011-07-09 DIAGNOSIS — M549 Dorsalgia, unspecified: Secondary | ICD-10-CM | POA: Insufficient documentation

## 2011-07-09 DIAGNOSIS — R10819 Abdominal tenderness, unspecified site: Secondary | ICD-10-CM | POA: Insufficient documentation

## 2011-07-09 DIAGNOSIS — I2510 Atherosclerotic heart disease of native coronary artery without angina pectoris: Secondary | ICD-10-CM | POA: Insufficient documentation

## 2011-07-09 DIAGNOSIS — R079 Chest pain, unspecified: Secondary | ICD-10-CM | POA: Insufficient documentation

## 2011-07-09 DIAGNOSIS — R0989 Other specified symptoms and signs involving the circulatory and respiratory systems: Secondary | ICD-10-CM | POA: Insufficient documentation

## 2011-07-09 DIAGNOSIS — Z79899 Other long term (current) drug therapy: Secondary | ICD-10-CM | POA: Insufficient documentation

## 2011-07-09 DIAGNOSIS — R141 Gas pain: Secondary | ICD-10-CM | POA: Insufficient documentation

## 2011-07-09 DIAGNOSIS — R110 Nausea: Secondary | ICD-10-CM | POA: Insufficient documentation

## 2011-07-09 DIAGNOSIS — E119 Type 2 diabetes mellitus without complications: Secondary | ICD-10-CM | POA: Insufficient documentation

## 2011-07-09 DIAGNOSIS — R0609 Other forms of dyspnea: Secondary | ICD-10-CM | POA: Insufficient documentation

## 2011-07-09 DIAGNOSIS — R319 Hematuria, unspecified: Secondary | ICD-10-CM | POA: Insufficient documentation

## 2011-07-09 DIAGNOSIS — R109 Unspecified abdominal pain: Secondary | ICD-10-CM | POA: Insufficient documentation

## 2011-07-09 DIAGNOSIS — I10 Essential (primary) hypertension: Secondary | ICD-10-CM | POA: Insufficient documentation

## 2011-07-09 DIAGNOSIS — R142 Eructation: Secondary | ICD-10-CM | POA: Insufficient documentation

## 2011-07-09 DIAGNOSIS — E785 Hyperlipidemia, unspecified: Secondary | ICD-10-CM | POA: Insufficient documentation

## 2011-07-09 DIAGNOSIS — Z7982 Long term (current) use of aspirin: Secondary | ICD-10-CM | POA: Insufficient documentation

## 2011-07-09 HISTORY — DX: Polycystic kidney, unspecified: Q61.3

## 2011-07-09 HISTORY — DX: Essential (primary) hypertension: I10

## 2011-07-09 LAB — CBC
Hemoglobin: 11.8 g/dL — ABNORMAL LOW (ref 13.0–17.0)
MCHC: 34.2 g/dL (ref 30.0–36.0)
Platelets: 223 10*3/uL (ref 150–400)
RDW: 12.8 % (ref 11.5–15.5)

## 2011-07-09 LAB — COMPREHENSIVE METABOLIC PANEL
AST: 15 U/L (ref 0–37)
Albumin: 4.1 g/dL (ref 3.5–5.2)
Calcium: 10.9 mg/dL — ABNORMAL HIGH (ref 8.4–10.5)
Creatinine, Ser: 1.33 mg/dL (ref 0.50–1.35)
Total Protein: 7.3 g/dL (ref 6.0–8.3)

## 2011-07-09 LAB — DIFFERENTIAL
Basophils Absolute: 0 10*3/uL (ref 0.0–0.1)
Basophils Relative: 1 % (ref 0–1)
Eosinophils Absolute: 0.1 10*3/uL (ref 0.0–0.7)
Monocytes Absolute: 0.7 10*3/uL (ref 0.1–1.0)
Neutro Abs: 3.3 10*3/uL (ref 1.7–7.7)

## 2011-07-09 LAB — POCT I-STAT TROPONIN I: Troponin i, poc: 0 ng/mL (ref 0.00–0.08)

## 2011-07-09 LAB — CK TOTAL AND CKMB (NOT AT ARMC)
CK, MB: 2.5 ng/mL (ref 0.3–4.0)
Relative Index: 1.8 (ref 0.0–2.5)
Total CK: 139 U/L (ref 7–232)

## 2011-07-12 NOTE — Progress Notes (Signed)
Addended by: Lazaro Arms A on: 07/12/2011 04:09 PM   Modules accepted: Orders

## 2011-07-12 NOTE — Progress Notes (Signed)
Addended by: Lazaro Arms A on: 07/12/2011 04:03 PM   Modules accepted: Orders

## 2011-08-29 ENCOUNTER — Ambulatory Visit (INDEPENDENT_AMBULATORY_CARE_PROVIDER_SITE_OTHER): Payer: Medicare Other | Admitting: Cardiology

## 2011-08-29 ENCOUNTER — Encounter: Payer: Self-pay | Admitting: Cardiology

## 2011-08-29 VITALS — BP 128/86 | HR 58 | Ht 65.0 in | Wt 223.6 lb

## 2011-08-29 DIAGNOSIS — E785 Hyperlipidemia, unspecified: Secondary | ICD-10-CM

## 2011-08-29 DIAGNOSIS — I10 Essential (primary) hypertension: Secondary | ICD-10-CM

## 2011-08-29 DIAGNOSIS — R079 Chest pain, unspecified: Secondary | ICD-10-CM

## 2011-08-29 DIAGNOSIS — Z951 Presence of aortocoronary bypass graft: Secondary | ICD-10-CM

## 2011-08-29 DIAGNOSIS — I2510 Atherosclerotic heart disease of native coronary artery without angina pectoris: Secondary | ICD-10-CM

## 2011-08-29 MED ORDER — NITROGLYCERIN 0.4 MG SL SUBL: 0.4000 mg | SUBLINGUAL_TABLET | SUBLINGUAL | Status: DC | PRN

## 2011-08-29 MED ORDER — GABAPENTIN 300 MG PO CAPS: 300.0000 mg | ORAL_CAPSULE | Freq: Three times a day (TID) | ORAL | Status: DC

## 2011-08-29 MED ORDER — ISOSORBIDE MONONITRATE ER 60 MG PO TB24: 120.0000 mg | ORAL_TABLET | ORAL | Status: DC

## 2011-08-29 NOTE — Assessment & Plan Note (Signed)
Blood pressure control is satisfactory.

## 2011-08-29 NOTE — Patient Instructions (Signed)
We refilled your sublingual NTG tablets.  Increase your Imdur dose to 120 mg daily. We refilled your neurontin at 300 mg three times daily.  I will see you again in 3 months.

## 2011-08-29 NOTE — Progress Notes (Signed)
Matthew Rowe Date of Birth: 05/25/1961   History of Present Illness: Matthew Rowe is seen  today for a follow up visit.  He has chronic angina. He is status post CABG in 1999 with a RIMA graft to the right coronary. He's had extensive stenting of the LAD in the past with subsequent occlusion of the distal vessel. His last cardiac catheterization was in July of this year. He is managed medically. He continues to have daily chest pain. He was seen in emergency room at the end of October with an episode of chest pain and had normal enzymes. On his previous visit we increased his Ranexa and isosorbide doses. He did not increase his isosorbide. He states that the higher dose of Ranexa made him feel bad and he couldn't function so he is now only taking 500 mg daily. He does report that the urologist told him he had low testosterone.  Current Outpatient Prescriptions on File Prior to Visit  Medication Sig Dispense Refill  . acetaminophen (TYLENOL) 500 MG tablet Take 500 mg by mouth every 6 (six) hours as needed. 2 tablets bid as needed       . amLODipine (NORVASC) 10 MG tablet Take 10 mg by mouth daily.        Marland Kitchen aspirin 325 MG tablet Take 325 mg by mouth daily.        . carvedilol (COREG) 25 MG tablet Take 25 mg by mouth 2 (two) times daily with a meal.        . hydrochlorothiazide 25 MG tablet Take 25 mg by mouth as needed.        Marland Kitchen losartan (COZAAR) 100 MG tablet Take 50 mg by mouth daily.       . metFORMIN (GLUCOPHAGE) 500 MG tablet Take 1,000 mg by mouth. 1000 mg in the morning 8 am, 500 mg at 6 pm, and 500 mg at 11 pm  (if sugar is below 140 at 11 pm do not take 11 o'clock dose if sugar is over 250 at11 pm take 1000 mg dose)  60 tablet    . pravastatin (PRAVACHOL) 40 MG tablet Take 1 tablet (40 mg total) by mouth every evening.  30 tablet    . ranolazine (RANEXA) 1000 MG SR tablet Take 1 tablet (1,000 mg total) by mouth 2 (two) times daily.      . vitamin E 1000 UNIT capsule Take 1,000 Units by  mouth 2 (two) times daily.        Marland Kitchen zolpidem (AMBIEN) 10 MG tablet Take 10 mg by mouth at bedtime as needed.       Marland Kitchen DISCONTD: nitroGLYCERIN (NITROLINGUAL) 0.4 MG/SPRAY spray Place 1 spray under the tongue every 5 (five) minutes as needed for chest pain.  12 g  3  . DISCONTD: nitroGLYCERIN (NITROSTAT) 0.4 MG SL tablet Place 0.4 mg under the tongue every 5 (five) minutes as needed.        Marland Kitchen DISCONTD: hydrochlorothiazide 25 MG tablet Take 1 tablet (25 mg total) by mouth daily.  30 tablet  11    Allergies  Allergen Reactions  . Biaxin   . Ciprocin-Fluocin-Procin (Fluocinolone Acetonide)   . Glimepiride (Amaryl)     Elevates liver function     Past Medical History  Diagnosis Date  . Chest pain     Chronic  . Renal failure, acute   . Diabetes mellitus   . Dyslipidemia   . Coronary artery disease     s/p cath  in July 2012 showing single vessel distal LAD disease, managed medically  . Acute MI, inferior wall 1999  . Hx of CABG   . Abnormal stress test November 2011  . Hypertension   . CAD (coronary artery disease)   . Polycystic kidney disease     Past Surgical History  Procedure Date  . Cystectomy     off face  . Cardiac catheterization 1999 / 2002 / 2004 / 2004    with stent / at least 4 surgeries  . Coronary artery bypass graft 2006    History  Smoking status  . Never Smoker   Smokeless tobacco  . Never Used    History  Alcohol Use No    Family History  Problem Relation Age of Onset  . Cancer Mother   . Diabetes Father   . Heart disease Father   . Anemia Mother   . Heart attack Father   . Heart failure Father   . Hyperlipidemia Father   . Hypertension Father   . Kidney disease Mother   . Sudden death Father   . Kidney failure Sister 5  . Heart attack Sister   . Kidney disease Sister     Review of Systems: The review of systems is positive for chest pain.  He has erectile dysfunction. All other systems were reviewed and are negative.  Physical  Exam: BP 128/86  Pulse 58  Ht 5\' 5"  (1.651 m)  Wt 101.424 kg (223 lb 9.6 oz)  BMI 37.21 kg/m2 Patient is a morbidly obese black male in no acute distress. Skin is warm and dry. Color is normal.  HEENT is unremarkable. Normocephalic/atraumatic. PERRL. Sclera are nonicteric. Neck is supple. No masses. No JVD. Lungs are clear. Cardiac exam shows a regular rate and rhythm. Abdomen is obese and soft. Extremities are without edema. Gait and ROM are intact. No gross neurologic deficits noted.  LABORATORY DATA: Laboratory data was reviewed from his hospital visit in October. His creatinine was down to 1.33. ECG showed no acute changes.  Assessment / Plan:

## 2011-08-29 NOTE — Assessment & Plan Note (Signed)
I recommended increasing his isosorbide to 120 mg daily. He'll continue with his current dose of Ranexa. I've refilled prescription for sublingual nitroglycerin. He may be pretty liberal with its use. We'll also renew his Neurontin 300 mg 3 times a day. I'll followup again in 3 months.

## 2011-09-11 HISTORY — PX: CARDIAC CATHETERIZATION: SHX172

## 2011-10-31 ENCOUNTER — Ambulatory Visit (INDEPENDENT_AMBULATORY_CARE_PROVIDER_SITE_OTHER): Payer: Medicare Other | Admitting: Family Medicine

## 2011-10-31 VITALS — BP 154/97 | HR 94 | Temp 98.3°F | Resp 18 | Ht 65.0 in | Wt 226.0 lb

## 2011-10-31 DIAGNOSIS — G4700 Insomnia, unspecified: Secondary | ICD-10-CM

## 2011-10-31 DIAGNOSIS — I10 Essential (primary) hypertension: Secondary | ICD-10-CM

## 2011-10-31 DIAGNOSIS — R109 Unspecified abdominal pain: Secondary | ICD-10-CM

## 2011-10-31 DIAGNOSIS — R1084 Generalized abdominal pain: Secondary | ICD-10-CM

## 2011-10-31 LAB — POCT CBC
HCT, POC: 40.5 % — AB (ref 43.5–53.7)
Hemoglobin: 13.4 g/dL — AB (ref 14.1–18.1)
Lymph, poc: 2.4 (ref 0.6–3.4)
MCH, POC: 28 pg (ref 27–31.2)
MCHC: 33.1 g/dL (ref 31.8–35.4)
RBC: 4.78 M/uL (ref 4.69–6.13)
WBC: 7.5 10*3/uL (ref 4.6–10.2)

## 2011-10-31 MED ORDER — LOSARTAN POTASSIUM 100 MG PO TABS: 50.0000 mg | ORAL_TABLET | Freq: Every day | ORAL | Status: DC

## 2011-10-31 MED ORDER — CARVEDILOL 25 MG PO TABS: 25.0000 mg | ORAL_TABLET | Freq: Two times a day (BID) | ORAL | Status: DC

## 2011-10-31 MED ORDER — AMLODIPINE BESYLATE 10 MG PO TABS: 10.0000 mg | ORAL_TABLET | Freq: Every day | ORAL | Status: DC

## 2011-10-31 MED ORDER — METFORMIN HCL 500 MG PO TABS: ORAL_TABLET | ORAL | Status: DC

## 2011-10-31 MED ORDER — HYDROCODONE-ACETAMINOPHEN 5-500 MG PO TABS: 1.0000 | ORAL_TABLET | Freq: Three times a day (TID) | ORAL | Status: DC | PRN

## 2011-10-31 MED ORDER — ZOLPIDEM TARTRATE 10 MG PO TABS: 10.0000 mg | ORAL_TABLET | Freq: Every evening | ORAL | Status: DC | PRN

## 2011-10-31 NOTE — Progress Notes (Signed)
Patient Name: Matthew MACBRIDE Sr. Date of Birth: 1961-04-17 Medical Record Number: [id number] Gender: male Date of Encounter: 10/31/2011  History of Present Illness:  Matthew Brink. is a 51 y.o. very pleasant male patient who presents with the following:  Noted abdominal pain last night around 5 or 6 pm- actually it has been present for about 2 days.  He was seen at the ED a few months ago with a similar problem. He was diagnosed with abdominal pain and given rx for percocet after having a negative CT scan.  He notes that he has continued to have problems with abdominal pain on and off since he was at the ER. He has the pain about once a month.  He has just been taking oxycodone prn for this.  He also bought some digestive enzymes which seem to have helped.  The pain is located in "the bottom of my stomach."  No nausea, no vomiting, no diarrhea.  No constipation or stool changes.  Also continues to have angina but this is treated by cardiology and is not changed recently.  He uses nitro prn which helps.  He seems to want more oxycodone to use for this intermittent abdominal pain.  The abdominal pain finaly went away about an hour ago.  Is usually worse after eating.   He prefers to use the nitroglycerin spray- however this is expensive to purchase through medicare.  He will ask his cardiologist to see if they have a coupon- we do not.    No further problems with blood in his urine.  He did see the urologist and was diagnosed with low testosterone.    Patient Active Problem List  Diagnoses  . Chest pain  . Renal failure, acute  . Dyslipidemia  . Coronary artery disease  . Acute MI, inferior wall  . Diabetes mellitus  . HTN (hypertension)   Past Medical History  Diagnosis Date  . Chest pain     Chronic  . Renal failure, acute   . Diabetes mellitus   . Dyslipidemia   . Coronary artery disease     s/p cath in July 2012 showing single vessel distal LAD disease, managed medically    . Acute MI, inferior wall 1999  . Hx of CABG   . Abnormal stress test November 2011  . Hypertension   . CAD (coronary artery disease)   . Polycystic kidney disease    Past Surgical History  Procedure Date  . Cystectomy     off face  . Cardiac catheterization 1999 / 2002 / 2004 / 2004    with stent / at least 4 surgeries  . Coronary artery bypass graft 2006   History  Substance Use Topics  . Smoking status: Never Smoker   . Smokeless tobacco: Never Used  . Alcohol Use: No   Family History  Problem Relation Age of Onset  . Cancer Mother   . Diabetes Father   . Heart disease Father   . Anemia Mother   . Heart attack Father   . Heart failure Father   . Hyperlipidemia Father   . Hypertension Father   . Kidney disease Mother   . Sudden death Father   . Kidney failure Sister 5  . Heart attack Sister   . Kidney disease Sister    Allergies  Allergen Reactions  . Biaxin   . Ciprocin-Fluocin-Procin (Fluocinolone Acetonide)   . Glimepiride (Amaryl)     Elevates liver function     Medication  list has been reviewed and updated.  Review of Systems:  Physical Examination: Filed Vitals:   10/31/11 1206  BP: 154/97  Pulse: 94  Temp: 98.3 F (36.8 C)  TempSrc: Oral  Resp: 18  Height: 5\' 5"  (1.651 m)  Weight: 226 lb (102.513 kg)    Body mass index is 37.61 kg/(m^2).  GEN: WDWN, NAD, Non-toxic, A & O x 3, obese HEENT: Atraumatic, Normocephalic. Neck supple. No masses, No LAD.   Oropharynx wnl Ears and Nose: No external deformity. CV: RRR, No M/G/R. No JVD. No thrill. No extra heart sounds. PULM: CTA B, no wheezes, crackles, rhonchi. No retractions. No resp. distress. No accessory muscle use. ABD: obese, no palpable masses, no tenderness at this time.  Negative murphy's sign. No rebound or guarding. Normal BS EXTR: No c/c/e NEURO Normal gait.  PSYCH: Normally interactive. Conversant. Not depressed or anxious appearing.  Calm demeanor.   Results for orders placed  in visit on 10/31/11  POCT CBC      Component Value Range   WBC 7.5  4.6 - 10.2 (K/uL)   Lymph, poc 2.4  0.6 - 3.4    POC LYMPH PERCENT 32.1  10 - 50 (%L)   MID (cbc) 0.8  0 - 0.9    POC MID % 11.2  0 - 12 (%M)   POC Granulocyte 4.3  2 - 6.9    Granulocyte percent 56.7  37 - 80 (%G)   RBC 4.78  4.69 - 6.13 (M/uL)   Hemoglobin 13.4 (*) 14.1 - 18.1 (g/dL)   HCT, POC 40.5 (*) 43.5 - 53.7 (%)   MCV 84.7  80 - 97 (fL)   MCH, POC 28.0  27 - 31.2 (pg)   MCHC 33.1  31.8 - 35.4 (g/dL)   RDW, POC 14.3     Platelet Count, POC 235  142 - 424 (K/uL)   MPV 10.7  0 - 99.8 (fL)    Assessment and Plan: 1. Abdominal  pain, other specified site  POCT CBC, HYDROcodone-acetaminophen (VICODIN) 5-500 MG per tablet  2. HTN (hypertension)  losartan (COZAAR) 100 MG tablet, amLODipine (NORVASC) 10 MG tablet, carvedilol (COREG) 25 MG tablet  3. Diabetes mellitus  metFORMIN (GLUCOPHAGE) 500 MG tablet  4. Insomnia  zolpidem (AMBIEN) 10 MG tablet   51 year old male with a complicated past cardiac history, here to reevaluate abdominal pain.  See history above.  I shared with him my reservations about using narcotics for treatment of abdominal pain without a definite diagnosis.  He then stated that he actually is taking the percocet for his chest pain.  Again, I let him know that if his chest pain requires narcotic medication he needs to see cardiology.  However, he states that his chest pain has not changed and he has had a recent cardiology evaluation.  As his abdomen is now negative and CBC is reassuring I will give him a limited supply of vicodin (10 pills) and set up an abdominal ultrasound within the next week.  Matthew Rowe is in a hurry to pick up his grandchildren from school and cannot wait for any further discussion or labs today, but he understands that he is to seek care if his symptoms return or worsen.  Also did refills of his other medications listed above today.    Although he is a bit anemic his  hemoglobin is better than it had been.  Will send him a letter reminding him to keep up with regular conoloscopies and  that he is due for a diabetes recheck soon.

## 2011-11-08 ENCOUNTER — Ambulatory Visit
Admission: RE | Admit: 2011-11-08 | Discharge: 2011-11-08 | Disposition: A | Discharge status: Home / Self Care | Payer: Medicare Other | Source: Ambulatory Visit | Attending: Family Medicine | Admitting: Family Medicine

## 2011-11-08 DIAGNOSIS — R109 Unspecified abdominal pain: Secondary | ICD-10-CM

## 2011-11-09 ENCOUNTER — Encounter: Payer: Self-pay | Admitting: Family Medicine

## 2011-11-10 ENCOUNTER — Inpatient Hospital Stay (HOSPITAL_COMMUNITY)
Admission: EM | Admit: 2011-11-10 | Discharge: 2011-11-11 | DRG: 641 | Disposition: A | Discharge status: Home / Self Care | Payer: Medicare Other | Attending: Family Medicine | Admitting: Family Medicine

## 2011-11-10 ENCOUNTER — Telehealth: Payer: Self-pay

## 2011-11-10 ENCOUNTER — Encounter (HOSPITAL_COMMUNITY): Payer: Self-pay | Admitting: *Deleted

## 2011-11-10 ENCOUNTER — Emergency Department (HOSPITAL_COMMUNITY): Payer: Medicare Other

## 2011-11-10 DIAGNOSIS — N289 Disorder of kidney and ureter, unspecified: Secondary | ICD-10-CM

## 2011-11-10 DIAGNOSIS — R1013 Epigastric pain: Secondary | ICD-10-CM | POA: Diagnosis present

## 2011-11-10 DIAGNOSIS — I10 Essential (primary) hypertension: Secondary | ICD-10-CM

## 2011-11-10 DIAGNOSIS — E785 Hyperlipidemia, unspecified: Secondary | ICD-10-CM | POA: Diagnosis present

## 2011-11-10 DIAGNOSIS — I252 Old myocardial infarction: Secondary | ICD-10-CM

## 2011-11-10 DIAGNOSIS — Z951 Presence of aortocoronary bypass graft: Secondary | ICD-10-CM

## 2011-11-10 DIAGNOSIS — E119 Type 2 diabetes mellitus without complications: Secondary | ICD-10-CM | POA: Diagnosis present

## 2011-11-10 DIAGNOSIS — R109 Unspecified abdominal pain: Secondary | ICD-10-CM

## 2011-11-10 DIAGNOSIS — E870 Hyperosmolality and hypernatremia: Principal | ICD-10-CM | POA: Diagnosis present

## 2011-11-10 DIAGNOSIS — I2510 Atherosclerotic heart disease of native coronary artery without angina pectoris: Secondary | ICD-10-CM | POA: Diagnosis present

## 2011-11-10 DIAGNOSIS — E875 Hyperkalemia: Secondary | ICD-10-CM | POA: Diagnosis present

## 2011-11-10 DIAGNOSIS — I209 Angina pectoris, unspecified: Secondary | ICD-10-CM | POA: Diagnosis present

## 2011-11-10 LAB — BASIC METABOLIC PANEL
BUN: 23 mg/dL (ref 6–23)
CO2: 17 mEq/L — ABNORMAL LOW (ref 19–32)
Calcium: 9.7 mg/dL (ref 8.4–10.5)
Chloride: 81 mEq/L — ABNORMAL LOW (ref 96–112)
Creatinine, Ser: 1.56 mg/dL — ABNORMAL HIGH (ref 0.50–1.35)
GFR calc Af Amer: 58 mL/min — ABNORMAL LOW (ref >90)
GFR calc non Af Amer: 50 mL/min — ABNORMAL LOW (ref >90)
Glucose, Bld: 223 mg/dL — ABNORMAL HIGH (ref 70–99)
Potassium: 5.6 mEq/L — ABNORMAL HIGH (ref 3.5–5.1)
Sodium: 167 mEq/L (ref 135–145)

## 2011-11-10 LAB — CBC
HCT: 37.4 % — ABNORMAL LOW (ref 39.0–52.0)
Hemoglobin: 13.1 g/dL (ref 13.0–17.0)
MCH: 29 pg (ref 26.0–34.0)
MCHC: 35 g/dL (ref 30.0–36.0)
MCV: 82.7 fL (ref 78.0–100.0)
Platelets: 206 10*3/uL (ref 150–400)
RBC: 4.52 MIL/uL (ref 4.22–5.81)
RDW: 12.7 % (ref 11.5–15.5)
WBC: 7.8 10*3/uL (ref 4.0–10.5)

## 2011-11-10 LAB — DIFFERENTIAL
Basophils Absolute: 0 10*3/uL (ref 0.0–0.1)
Basophils Relative: 0 % (ref 0–1)
Eosinophils Absolute: 0.1 10*3/uL (ref 0.0–0.7)
Eosinophils Relative: 2 % (ref 0–5)
Lymphocytes Relative: 32 % (ref 12–46)
Lymphs Abs: 2.5 10*3/uL (ref 0.7–4.0)
Monocytes Absolute: 0.7 10*3/uL (ref 0.1–1.0)
Monocytes Relative: 9 % (ref 3–12)
Neutro Abs: 4.4 10*3/uL (ref 1.7–7.7)
Neutrophils Relative %: 57 % (ref 43–77)

## 2011-11-10 LAB — OSMOLALITY: Osmolality: 296 mOsm/kg (ref 275–300)

## 2011-11-10 LAB — SODIUM, URINE, RANDOM: Sodium, Ur: 108 mEq/L

## 2011-11-10 LAB — POCT I-STAT TROPONIN I: Troponin i, poc: 0 ng/mL (ref 0.00–0.08)

## 2011-11-10 MED ORDER — INSULIN ASPART 100 UNIT/ML ~~LOC~~ SOLN
10.0000 [IU] | Freq: Once | SUBCUTANEOUS | Status: AC
  Administered 2011-11-10: 10 [IU] via INTRAVENOUS
  Filled 2011-11-10: qty 1

## 2011-11-10 MED ORDER — SODIUM CHLORIDE 0.45 % IV SOLN
INTRAVENOUS | Status: DC
  Administered 2011-11-11: 04:00:00 via INTRAVENOUS
  Administered 2011-11-11: 250 mL via INTRAVENOUS

## 2011-11-10 MED ORDER — PANTOPRAZOLE SODIUM 40 MG IV SOLR
40.0000 mg | INTRAVENOUS | Status: DC
  Administered 2011-11-11: 40 mg via INTRAVENOUS
  Filled 2011-11-10 (×2): qty 40

## 2011-11-10 MED ORDER — SODIUM BICARBONATE 8.4 % IV SOLN
50.0000 meq | Freq: Once | INTRAVENOUS | Status: AC
  Administered 2011-11-10: 50 meq via INTRAVENOUS
  Filled 2011-11-10: qty 50

## 2011-11-10 MED ORDER — SODIUM CHLORIDE 0.9 % IV BOLUS (SEPSIS)
1000.0000 mL | Freq: Once | INTRAVENOUS | Status: AC
  Administered 2011-11-10: 1000 mL via INTRAVENOUS

## 2011-11-10 MED ORDER — CALCIUM GLUCONATE 10 % IV SOLN: 1.0000 g | Freq: Once | INTRAVENOUS | Status: DC

## 2011-11-10 MED ORDER — CALCIUM GLUCONATE 10 % IV SOLN
1.0000 g | INTRAVENOUS | Status: AC
  Administered 2011-11-10: 1 g via INTRAVENOUS
  Filled 2011-11-10: qty 10

## 2011-11-10 MED ORDER — SODIUM POLYSTYRENE SULFONATE 15 GM/60ML PO SUSP
30.0000 g | Freq: Once | ORAL | Status: AC
  Administered 2011-11-10: 30 g via ORAL
  Filled 2011-11-10: qty 120

## 2011-11-10 MED ORDER — DEXTROSE 50 % IV SOLN
25.0000 mL | Freq: Once | INTRAVENOUS | Status: AC
  Administered 2011-11-10: 25 mL via INTRAVENOUS
  Filled 2011-11-10: qty 50

## 2011-11-10 NOTE — ED Notes (Signed)
Pt reports going to pcp for same recently, having mid chest tightness, denies recent cough, having nausea. ekg done at triage.

## 2011-11-10 NOTE — Telephone Encounter (Signed)
Patients wife called because pt had an appointment on Thursday for a sonogram (ref'ed by Dr. Lorelei Pont). Pt has yet to hear anything about what he may have and pt is in pain. Please review results from Thursday's appointment and call pt with results.

## 2011-11-10 NOTE — ED Notes (Signed)
Patient C/O having intermittent chest pain for 1 week. States that it was a pressure across his chest and went down into his upper abdomen.  States that he saw is MD and she did some tests.  Pain worsened today so patient came to ED for evaluation.  States that pain is 7/10 at this time and is non-radiating.

## 2011-11-10 NOTE — ED Provider Notes (Signed)
History    51 year old male with chest pain. Onset about a week ago. Cannot remember what he was doing at onset. Pain has been intermittent. Last hours when he has it. Lower substernal area. Achy in character. No radiation. No fevers or chills. Mild dyspnea when he has the pain. No nausea or vomiting. No dizziness or lightheadedness. No unusual leg pain or swelling. Denies history of blood clot. Patient has known coronary artery disease. Status post stenting. Last catheterization was in July 2012. Cardiologist is Dr. Martinique.  CSN: [id number]  Arrival date & time 11/10/11  [id number]   First MD Initiated Contact with Patient 11/10/11 1855      Chief Complaint  Patient presents with  . Chest Pain    (Consider location/radiation/quality/duration/timing/severity/associated sxs/prior treatment) HPI  Past Medical History  Diagnosis Date  . Chest pain     Chronic  . Renal failure, acute   . Diabetes mellitus   . Dyslipidemia   . Coronary artery disease     s/p cath in July 2012 showing single vessel distal LAD disease, managed medically  . Acute MI, inferior wall 1999  . Hx of CABG   . Abnormal stress test November 2011  . Hypertension   . CAD (coronary artery disease)   . Polycystic kidney disease     Past Surgical History  Procedure Date  . Cystectomy     off face  . Cardiac catheterization 1999 / 2002 / 2004 / 2004    with stent / at least 4 surgeries  . Coronary artery bypass graft 2006    Family History  Problem Relation Age of Onset  . Cancer Mother   . Diabetes Father   . Heart disease Father   . Anemia Mother   . Heart attack Father   . Heart failure Father   . Hyperlipidemia Father   . Hypertension Father   . Kidney disease Mother   . Sudden death Father   . Kidney failure Sister 5  . Heart attack Sister   . Kidney disease Sister     History  Substance Use Topics  . Smoking status: Never Smoker   . Smokeless tobacco: Never Used  . Alcohol Use: No       Review of Systems   Review of symptoms negative unless otherwise noted in HPI.   Allergies  Biaxin; Ciprocin-fluocin-procin; and Glimepiride  Home Medications   Current Outpatient Rx  Name Route Sig Dispense Refill  . ACETAMINOPHEN 500 MG PO TABS Oral Take 1,000 mg by mouth 2 (two) times daily as needed. For pain    . AMLODIPINE BESYLATE 10 MG PO TABS Oral Take 1 tablet (10 mg total) by mouth daily. 90 tablet 2  . ASPIRIN 325 MG PO TABS Oral Take 325 mg by mouth daily.      Marland Kitchen CARVEDILOL 25 MG PO TABS Oral Take 1 tablet (25 mg total) by mouth 2 (two) times daily with a meal. 180 tablet 2  . GABAPENTIN 300 MG PO CAPS Oral Take 1 capsule (300 mg total) by mouth 3 (three) times daily. 90 capsule 11  . HYDROCHLOROTHIAZIDE 25 MG PO TABS Oral Take 25 mg by mouth daily as needed. For blood pressure    . HYDROCODONE-ACETAMINOPHEN 5-500 MG PO TABS Oral Take 1 tablet by mouth every 8 (eight) hours as needed. For pain    . ISOSORBIDE MONONITRATE ER 60 MG PO TB24 Oral Take 2 tablets (120 mg total) by mouth every morning. 60 tablet 11  .  LOSARTAN POTASSIUM 100 MG PO TABS Oral Take 0.5 tablets (50 mg total) by mouth daily. 45 tablet 3  . METFORMIN HCL 500 MG PO TABS Oral Take 500-1,000 mg by mouth 3 (three) times daily. 1000 mg in the morning 8 am, 500 mg at 6 pm, and 500 mg at 11 pm  (if sugar is below 140 at 11 pm do not take 11 o'clock dose if sugar is over 250 at11 pm take 1000 mg dose)    . NITROGLYCERIN 0.4 MG SL SUBL Sublingual Place 1 tablet (0.4 mg total) under the tongue every 5 (five) minutes as needed. 100 tablet 11  . PRAVASTATIN SODIUM 40 MG PO TABS Oral Take 1 tablet (40 mg total) by mouth every evening. 30 tablet   . RANOLAZINE ER 1000 MG PO TB12 Oral Take 1 tablet (1,000 mg total) by mouth 2 (two) times daily.    Marland Kitchen VITAMIN E 1000 UNITS PO CAPS Oral Take 1,000 Units by mouth 2 (two) times daily.      Marland Kitchen ZOLPIDEM TARTRATE 10 MG PO TABS Oral Take 10 mg by mouth at bedtime as needed.  For insomnia      BP 161/91  Pulse 75  Temp(Src) 98.1 F (36.7 C) (Oral)  Resp 18  SpO2 96%  Physical Exam  Nursing note and vitals reviewed. Constitutional: He appears well-developed and well-nourished. No distress.       Laying in bed. No acute distress. Obese.  HENT:  Head: Normocephalic and atraumatic.  Eyes: Conjunctivae are normal. Right eye exhibits no discharge. Left eye exhibits no discharge.  Neck: Neck supple.  Cardiovascular: Normal rate, regular rhythm and normal heart sounds.  Exam reveals no gallop and no friction rub.        Chest pain is reproducible with palpation.  Pulmonary/Chest: Effort normal and breath sounds normal. No respiratory distress. He exhibits tenderness.  Abdominal: Soft. He exhibits no distension. There is no tenderness.  Musculoskeletal: He exhibits no edema and no tenderness.  Neurological: He is alert.  Skin: Skin is warm and dry.  Psychiatric: He has a normal mood and affect. His behavior is normal. Thought content normal.    ED Course  Procedures (including critical care time)  Labs Reviewed  CBC - Abnormal; Notable for the following:    HCT 37.4 (*)    All other components within normal limits  BASIC METABOLIC PANEL - Abnormal; Notable for the following:    Sodium 167 (*)    Potassium 5.6 (*)    Chloride 81 (*)    CO2 17 (*)    Glucose, Bld 223 (*)    Creatinine, Ser 1.56 (*)    GFR calc non Af Amer 50 (*)    GFR calc Af Amer 58 (*)    All other components within normal limits  DIFFERENTIAL  POCT I-STAT TROPONIN I   Dg Chest 2 View  11/10/2011  *RADIOLOGY REPORT*  Clinical Data: Chest pain  CHEST - 2 VIEW  Comparison: 07/09/2011 the  Findings: Prior median sternotomy and CABG procedure.  Heart size appears normal.  No pleural effusion or edema noted.  There is no airspace consolidation identified.  IMPRESSION:  1.  No active cardiopulmonary abnormalities.  Original Report Authenticated By: Angelita Ingles, M.D.   EKG:   Rhythm: Normal sinus rhythm Rate: 77 Axis: Left axis deviation Intervals: Left axis deviation based on QRS amplitude greater than 11 mm in aVL. Left atrium enlargement. Poor R wave progression. ST segments: Nonspecific ST  changes. Comparison: No significant change from prior EKG from 07/09/2011  1. Hypernatremia   2. Acute renal insufficiency   3. Chest pain       MDM  51 year old male with chest pain. Incidentally patient was found to be hypernatremic. Suspect this is secondary to nephrogenic diabetes insipidus related to his history of polycystic kidney disease. Patient's chest pain particularly given his history of known coronary artery disease is concerning despite the pain being somewhat reproducible. EKG with no provocative changes. Initial troponin was normal. Mild hyperkalemia was noted but with no concerning EKG changes. This is treated medically. Patient will quiet admission for further treatment and evaluation. Additional urine studies were ordered.       Virgel Manifold, MD 11/10/11 2125

## 2011-11-10 NOTE — H&P (Signed)
Matthew TEDFORD Sr. is an 51 y.o. male.    PCP: Dr. Lorelei Pont  Chief Complaint: Chest/epigastric pain, Hypernatremia  HPI: 51 year old male with history of CAD status post CABG , polycystic kidney disease, hypertension who presents with one-week intermittent chest/epigastric pains. Patient describes random onset of central sternal and epigastric pressure. This is unrelated to activity or any known exacerbating factors. Was evaluated by PCP last week and underwent negative abdominal ultrasound. Patient notes that oxycodone helps his pain, but was prescribed Vicodin more recently and this does not help. He notes nitroglycerin helps somewhat but does not resolve the pain. At times radiates to left arm and left leg. Has nausea but denies diaphoresis, palpitations, shortness of breath, emesis, indigestion.  In the ED, EKG and initial troponins were negative. Patient is pain free currently. However, his sodium was found to be 167 which is new onset. Patient has notices slight increase in thirst and drinks plenty of diet coke. Denies change in urinary patterns.   Last cardiac catheterization in July 2012 showing distal LAD disease with medical management. History of EGD in 2006 with no abnormalities per  Past Medical History  Diagnosis Date  . Chest pain     Chronic  . Renal failure, acute   . Diabetes mellitus   . Dyslipidemia   . Coronary artery disease     s/p cath in July 2012 showing single vessel distal LAD disease, managed medically  . Acute MI, inferior wall 1999  . Hx of CABG   . Abnormal stress test November 2011  . Hypertension   . CAD (coronary artery disease)   . Polycystic kidney disease     Past Surgical History  Procedure Date  . Cystectomy     off face  . Cardiac catheterization 1999 / 2002 / 2004 / 2004    with stent / at least 4 surgeries  . Coronary artery bypass graft 2006    Family History  Problem Relation Age of Onset  . Cancer Mother   . Diabetes Father   .  Heart disease Father   . Anemia Mother   . Heart attack Father   . Heart failure Father   . Hyperlipidemia Father   . Hypertension Father   . Kidney disease (polycystic kidney) Mother   . Sudden death Father   . Kidney failure Sister 5  . Heart attack Sister   . Kidney disease (polycystic kidney) Sister    Social History:  reports that he has never smoked. He has never used smokeless tobacco. He reports that he does not drink alcohol or use illicit drugs.  Allergies:  Allergies  Allergen Reactions  . Biaxin   . Ciprocin-Fluocin-Procin (Fluocinolone Acetonide)   . Glimepiride (Amaryl)     Elevates liver function     Medications Prior to Admission  Medication Dose Route Frequency Provider Last Rate Last Dose  . 0.45 % sodium chloride infusion   Intravenous Continuous Luis Abed, MD      . calcium gluconate 1 g in sodium chloride 0.9 % 100 mL IVPB  1 g Intravenous To ER Virgel Manifold, MD   1 g at 11/10/11 2100  . dextrose 50 % solution 25 mL  25 mL Intravenous Once Virgel Manifold, MD   25 mL at 11/10/11 2025  . insulin aspart (novoLOG) injection 10 Units  10 Units Intravenous Once Virgel Manifold, MD   10 Units at 11/10/11 2027  . sodium bicarbonate injection 50 mEq  50 mEq Intravenous Once Annie Main  Kohut, MD   50 mEq at 11/10/11 2027  . sodium chloride 0.9 % bolus 1,000 mL  1,000 mL Intravenous Once Virgel Manifold, MD   1,000 mL at 11/10/11 2000  . sodium polystyrene (KAYEXALATE) 15 GM/60ML suspension 30 g  30 g Oral Once Virgel Manifold, MD   30 g at 11/10/11 2027  . DISCONTD: calcium gluconate injection - for URGENT use only!  1 g Intravenous Once Virgel Manifold, MD       Medications Prior to Admission  Medication Sig Dispense Refill  . acetaminophen (TYLENOL) 500 MG tablet Take 1,000 mg by mouth 2 (two) times daily as needed. For pain      . amLODipine (NORVASC) 10 MG tablet Take 1 tablet (10 mg total) by mouth daily.  90 tablet  2  . aspirin 325 MG tablet Take 325 mg by mouth daily.         . carvedilol (COREG) 25 MG tablet Take 1 tablet (25 mg total) by mouth 2 (two) times daily with a meal.  180 tablet  2  . gabapentin (NEURONTIN) 300 MG capsule Take 1 capsule (300 mg total) by mouth 3 (three) times daily.  90 capsule  11  . hydrochlorothiazide 25 MG tablet Take 25 mg by mouth daily as needed. For blood pressure      . HYDROcodone-acetaminophen (VICODIN) 5-500 MG per tablet Take 1 tablet by mouth every 8 (eight) hours as needed. For pain      . isosorbide mononitrate (IMDUR) 60 MG 24 hr tablet Take 2 tablets (120 mg total) by mouth every morning.  60 tablet  11  . losartan (COZAAR) 100 MG tablet Take 0.5 tablets (50 mg total) by mouth daily.  45 tablet  3  . metFORMIN (GLUCOPHAGE) 500 MG tablet Take 500-1,000 mg by mouth 3 (three) times daily. 1000 mg in the morning 8 am, 500 mg at 6 pm, and 500 mg at 11 pm  (if sugar is below 140 at 11 pm do not take 11 o'clock dose if sugar is over 250 at11 pm take 1000 mg dose)      . nitroGLYCERIN (NITROSTAT) 0.4 MG SL tablet Place 1 tablet (0.4 mg total) under the tongue every 5 (five) minutes as needed.  100 tablet  11  . pravastatin (PRAVACHOL) 40 MG tablet Take 1 tablet (40 mg total) by mouth every evening.  30 tablet    . ranolazine (RANEXA) 1000 MG SR tablet Take 1 tablet (1,000 mg total) by mouth 2 (two) times daily.      . vitamin E 1000 UNIT capsule Take 1,000 Units by mouth 2 (two) times daily.        Marland Kitchen zolpidem (AMBIEN) 10 MG tablet Take 10 mg by mouth at bedtime as needed. For insomnia      . DISCONTD: metFORMIN (GLUCOPHAGE) 500 MG tablet 1000 mg in the morning 8 am, 500 mg at 6 pm, and 500 mg at 11 pm  (if sugar is below 140 at 11 pm do not take 11 o'clock dose if sugar is over 250 at11 pm take 1000 mg dose)  180 tablet  1  . DISCONTD: zolpidem (AMBIEN) 10 MG tablet Take 1 tablet (10 mg total) by mouth at bedtime as needed.  30 tablet  2  . DISCONTD: hydrochlorothiazide 25 MG tablet Take 1 tablet (25 mg total) by mouth daily.   30 tablet  11    Results for orders placed during the hospital encounter of 11/10/11 (from the  past 48 hour(s))  CBC     Status: Abnormal   Collection Time   11/10/11  7:01 PM      Component Value Range Comment   WBC 7.8  4.0 - 10.5 (K/uL)    RBC 4.52  4.22 - 5.81 (MIL/uL)    Hemoglobin 13.1  13.0 - 17.0 (g/dL)    HCT 37.4 (*) 39.0 - 52.0 (%)    MCV 82.7  78.0 - 100.0 (fL)    MCH 29.0  26.0 - 34.0 (pg)    MCHC 35.0  30.0 - 36.0 (g/dL)    RDW 12.7  11.5 - 15.5 (%)    Platelets 206  150 - 400 (K/uL)   DIFFERENTIAL     Status: Normal   Collection Time   11/10/11  7:01 PM      Component Value Range Comment   Neutrophils Relative 57  43 - 77 (%)    Neutro Abs 4.4  1.7 - 7.7 (K/uL)    Lymphocytes Relative 32  12 - 46 (%)    Lymphs Abs 2.5  0.7 - 4.0 (K/uL)    Monocytes Relative 9  3 - 12 (%)    Monocytes Absolute 0.7  0.1 - 1.0 (K/uL)    Eosinophils Relative 2  0 - 5 (%)    Eosinophils Absolute 0.1  0.0 - 0.7 (K/uL)    Basophils Relative 0  0 - 1 (%)    Basophils Absolute 0.0  0.0 - 0.1 (K/uL)   BASIC METABOLIC PANEL     Status: Abnormal   Collection Time   11/10/11  7:01 PM      Component Value Range Comment   Sodium 167 (*) 135 - 145 (mEq/L)    Potassium 5.6 (*) 3.5 - 5.1 (mEq/L)    Chloride 81 (*) 96 - 112 (mEq/L)    CO2 17 (*) 19 - 32 (mEq/L)    Glucose, Bld 223 (*) 70 - 99 (mg/dL)    BUN 23  6 - 23 (mg/dL)    Creatinine, Ser 1.56 (*) 0.50 - 1.35 (mg/dL)    Calcium 9.7  8.4 - 10.5 (mg/dL)    GFR calc non Af Amer 50 (*) >90 (mL/min)    GFR calc Af Amer 58 (*) >90 (mL/min)   POCT I-STAT TROPONIN I     Status: Normal   Collection Time   11/10/11  7:24 PM      Component Value Range Comment   Troponin i, poc 0.00  0.00 - 0.08 (ng/mL)    Comment 3            OSMOLALITY     Status: Normal   Collection Time   11/10/11  8:22 PM      Component Value Range Comment   Osmolality 296  275 - 300 (mOsm/kg)    Dg Chest 2 View  11/10/2011  *RADIOLOGY REPORT*  Clinical Data: Chest pain   CHEST - 2 VIEW  Comparison: 07/09/2011 the  Findings: Prior median sternotomy and CABG procedure.  Heart size appears normal.  No pleural effusion or edema noted.  There is no airspace consolidation identified.  IMPRESSION:  1.  No active cardiopulmonary abnormalities.  Original Report Authenticated By: Angelita Ingles, M.D.    ROS See  HPI otherwise negative.  Blood pressure 144/91, pulse 77, temperature 98.1 F (36.7 C), temperature source Oral, resp. rate 18, SpO2 100.00%. Physical Exam  Vitals reviewed. Constitutional: He is oriented to person, place, and time. He appears well-developed  and well-nourished. No distress.  HENT:  Head: Normocephalic and atraumatic.  Mouth/Throat: Oropharynx is clear and moist.  Eyes: EOM are normal. Pupils are equal, round, and reactive to light.  Cardiovascular: Normal rate, regular rhythm, normal heart sounds and intact distal pulses.   No murmur heard. Respiratory: Effort normal and breath sounds normal. No respiratory distress. He has no wheezes. He has no rales.       Mild tenderness to palpation on the lower sternum, reproducible  GI: Bowel sounds are normal. He exhibits distension. There is no tenderness. There is no rebound and no guarding.       Moderate distention.  Musculoskeletal: He exhibits no edema and no tenderness.  Neurological: He is alert and oriented to person, place, and time. Coordination normal.  Skin: No rash noted.     Assessment/Plan   51 year old male with history of CAD status post CABG , polycystic kidney disease, hypertension who presents with one-week intermittent chest/epigastric pains and new onset hypernatremia.  1. Hypernatremia. Uncertain of time of onset. Likely caused by polycystic kidney disease and nephrogenic diabetes insipidus. Urine studies are pending including osmolality. No mental status changes currently. Will attempt to slowly correct the derangement with free water replacement. Will likely take 2-3  days judging free water deficit calculation of 9.6 L. Will start conservatively with half normal saline at 250 cc per hour. Recheck BMET every 4-6 hours. Low sodium diet.  2. Chest pain. Atypical for cardiac etiology, most likely this is gastric in nature but does have CAD and history of chronic angina. Will cycle cardiac enzymes and repeat EKG in a.m. Monitor on telemetry. Will continue cardiac medications including aspirin, renexa, imdur for chronic angina. Will give GI cocktail x1 and start PPI.  3. Hyperkalemia. Dosed with Kayexalate, sodium bicarb and calcium gluconate in ED. Hold Cozaar overnight and restart once in normal range. F/u BMET.  4. DM. Hyperglycemia mild. Hold metformin and start sliding scale insulin as needed.   5. FEN. Fluids as above. Check BMET q 4hours. I see no clinical history of CHF, (calculated EF 46% on myoview 2011) will follow volume status clinically with fluid hydration as he already had bolus 1L NS in ED. Zofran prn.  6. PPX. Protonix and heparin sq.  7. Dispo. Pending clinical improvement.  [place] PGY-2 11/10/2011, 10:51 PM [phone]

## 2011-11-10 NOTE — Telephone Encounter (Signed)
Please review US.  

## 2011-11-11 ENCOUNTER — Encounter (HOSPITAL_COMMUNITY): Payer: Self-pay | Admitting: *Deleted

## 2011-11-11 ENCOUNTER — Other Ambulatory Visit: Payer: Self-pay

## 2011-11-11 DIAGNOSIS — R109 Unspecified abdominal pain: Secondary | ICD-10-CM

## 2011-11-11 DIAGNOSIS — R0789 Other chest pain: Secondary | ICD-10-CM

## 2011-11-11 LAB — COMPREHENSIVE METABOLIC PANEL
ALT: 17 U/L (ref 0–53)
AST: 17 U/L (ref 0–37)
Albumin: 3.6 g/dL (ref 3.5–5.2)
Alkaline Phosphatase: 58 U/L (ref 39–117)
BUN: 18 mg/dL (ref 6–23)
CO2: 21 mEq/L (ref 19–32)
Calcium: 9.1 mg/dL (ref 8.4–10.5)
Chloride: 103 mEq/L (ref 96–112)
Creatinine, Ser: 1.46 mg/dL — ABNORMAL HIGH (ref 0.50–1.35)
GFR calc Af Amer: 63 mL/min — ABNORMAL LOW (ref >90)
GFR calc non Af Amer: 54 mL/min — ABNORMAL LOW (ref >90)
Glucose, Bld: 359 mg/dL — ABNORMAL HIGH (ref 70–99)
Potassium: 4.4 mEq/L (ref 3.5–5.1)
Sodium: 135 mEq/L (ref 135–145)
Total Bilirubin: 0.4 mg/dL (ref 0.3–1.2)
Total Protein: 7 g/dL (ref 6.0–8.3)

## 2011-11-11 LAB — CBC
HCT: 36.5 % — ABNORMAL LOW (ref 39.0–52.0)
Hemoglobin: 12.6 g/dL — ABNORMAL LOW (ref 13.0–17.0)
MCH: 28.6 pg (ref 26.0–34.0)
MCHC: 34.5 g/dL (ref 30.0–36.0)
MCV: 83 fL (ref 78.0–100.0)
Platelets: 222 10*3/uL (ref 150–400)
RBC: 4.4 MIL/uL (ref 4.22–5.81)
RDW: 12.7 % (ref 11.5–15.5)
WBC: 6.3 10*3/uL (ref 4.0–10.5)

## 2011-11-11 LAB — URINALYSIS, ROUTINE W REFLEX MICROSCOPIC
Bilirubin Urine: NEGATIVE
Glucose, UA: >1000 mg/dL — AB
Ketones, ur: NEGATIVE mg/dL
Protein, ur: NEGATIVE mg/dL
pH: 6 (ref 5.0–8.0)

## 2011-11-11 LAB — CARDIAC PANEL(CRET KIN+CKTOT+MB+TROPI)
CK, MB: 2.4 ng/mL (ref 0.3–4.0)
CK, MB: 2.2 ng/mL (ref 0.3–4.0)
Relative Index: 1.9 (ref 0.0–2.5)
Relative Index: 1.8 (ref 0.0–2.5)
Total CK: 124 U/L (ref 7–232)
Total CK: 119 U/L (ref 7–232)
Troponin I: <0.3 ng/mL (ref <0.30)
Troponin I: <0.3 ng/mL (ref <0.30)

## 2011-11-11 LAB — GLUCOSE, CAPILLARY
Glucose-Capillary: 217 mg/dL — ABNORMAL HIGH (ref 70–99)
Glucose-Capillary: 234 mg/dL — ABNORMAL HIGH (ref 70–99)

## 2011-11-11 LAB — BASIC METABOLIC PANEL
BUN: 20 mg/dL (ref 6–23)
CO2: 19 mEq/L (ref 19–32)
Calcium: 9.3 mg/dL (ref 8.4–10.5)
Chloride: 104 mEq/L (ref 96–112)
Creatinine, Ser: 1.47 mg/dL — ABNORMAL HIGH (ref 0.50–1.35)
GFR calc Af Amer: 62 mL/min — ABNORMAL LOW (ref >90)
GFR calc non Af Amer: 54 mL/min — ABNORMAL LOW (ref >90)
Glucose, Bld: 321 mg/dL — ABNORMAL HIGH (ref 70–99)
Potassium: 4.6 mEq/L (ref 3.5–5.1)
Sodium: 137 mEq/L (ref 135–145)

## 2011-11-11 LAB — OSMOLALITY, URINE: Osmolality, Ur: 558 mOsm/kg (ref 390–1090)

## 2011-11-11 LAB — LIPASE, BLOOD: Lipase: 44 U/L (ref 11–59)

## 2011-11-11 LAB — TSH: TSH: 1.924 u[IU]/mL (ref 0.350–4.500)

## 2011-11-11 LAB — PHOSPHORUS: Phosphorus: 3.7 mg/dL (ref 2.3–4.6)

## 2011-11-11 LAB — HEMOGLOBIN A1C
Hgb A1c MFr Bld: 9.2 % — ABNORMAL HIGH (ref <5.7)
Mean Plasma Glucose: 217 mg/dL — ABNORMAL HIGH (ref <117)

## 2011-11-11 LAB — MAGNESIUM: Magnesium: 1.7 mg/dL (ref 1.5–2.5)

## 2011-11-11 MED ORDER — OXYCODONE-ACETAMINOPHEN 5-325 MG PO TABS: 1.0000 | ORAL_TABLET | ORAL | Status: AC | PRN

## 2011-11-11 MED ORDER — GABAPENTIN 300 MG PO CAPS
300.0000 mg | ORAL_CAPSULE | Freq: Three times a day (TID) | ORAL | Status: DC
  Administered 2011-11-11 (×2): 300 mg via ORAL
  Filled 2011-11-11 (×3): qty 1

## 2011-11-11 MED ORDER — LOSARTAN POTASSIUM 50 MG PO TABS
50.0000 mg | ORAL_TABLET | Freq: Every day | ORAL | Status: DC
  Administered 2011-11-11: 50 mg via ORAL
  Filled 2011-11-11: qty 1

## 2011-11-11 MED ORDER — OXYCODONE HCL 5 MG PO TABS: 5.0000 mg | ORAL_TABLET | Freq: Four times a day (QID) | ORAL | Status: DC | PRN

## 2011-11-11 MED ORDER — ACETAMINOPHEN 650 MG RE SUPP: 650.0000 mg | Freq: Four times a day (QID) | RECTAL | Status: DC | PRN

## 2011-11-11 MED ORDER — ACETAMINOPHEN 325 MG PO TABS: 650.0000 mg | ORAL_TABLET | Freq: Four times a day (QID) | ORAL | Status: DC | PRN

## 2011-11-11 MED ORDER — ONDANSETRON HCL 4 MG/2ML IJ SOLN: 4.0000 mg | Freq: Four times a day (QID) | INTRAMUSCULAR | Status: DC | PRN

## 2011-11-11 MED ORDER — ALUM & MAG HYDROXIDE-SIMETH 200-200-20 MG/5ML PO SUSP: 30.0000 mL | Freq: Four times a day (QID) | ORAL | Status: DC | PRN

## 2011-11-11 MED ORDER — GI COCKTAIL ~~LOC~~
30.0000 mL | Freq: Once | ORAL | Status: AC
  Administered 2011-11-11: 30 mL via ORAL
  Filled 2011-11-11: qty 30

## 2011-11-11 MED ORDER — ASPIRIN EC 325 MG PO TBEC
325.0000 mg | DELAYED_RELEASE_TABLET | Freq: Every day | ORAL | Status: DC
  Administered 2011-11-11: 325 mg via ORAL
  Filled 2011-11-11: qty 1

## 2011-11-11 MED ORDER — ONDANSETRON HCL 4 MG PO TABS: 4.0000 mg | ORAL_TABLET | Freq: Four times a day (QID) | ORAL | Status: DC | PRN

## 2011-11-11 MED ORDER — HEPARIN SODIUM (PORCINE) 5000 UNIT/ML IJ SOLN
5000.0000 [IU] | Freq: Three times a day (TID) | INTRAMUSCULAR | Status: DC
  Filled 2011-11-11 (×4): qty 1

## 2011-11-11 MED ORDER — RANOLAZINE ER 500 MG PO TB12
1000.0000 mg | ORAL_TABLET | Freq: Two times a day (BID) | ORAL | Status: DC
  Administered 2011-11-11: 1000 mg via ORAL
  Filled 2011-11-11 (×2): qty 2

## 2011-11-11 MED ORDER — CARVEDILOL 25 MG PO TABS
25.0000 mg | ORAL_TABLET | Freq: Two times a day (BID) | ORAL | Status: DC
  Administered 2011-11-11 (×2): 25 mg via ORAL
  Filled 2011-11-11 (×3): qty 1

## 2011-11-11 MED ORDER — SIMVASTATIN 20 MG PO TABS
20.0000 mg | ORAL_TABLET | Freq: Every day | ORAL | Status: DC
  Administered 2011-11-11: 20 mg via ORAL
  Filled 2011-11-11: qty 1

## 2011-11-11 MED ORDER — ISOSORBIDE MONONITRATE ER 60 MG PO TB24
120.0000 mg | ORAL_TABLET | Freq: Every day | ORAL | Status: DC
  Administered 2011-11-11: 120 mg via ORAL
  Filled 2011-11-11 (×2): qty 2

## 2011-11-11 MED ORDER — CARVEDILOL 25 MG PO TABS: 37.5000 mg | ORAL_TABLET | Freq: Two times a day (BID) | ORAL | Status: DC

## 2011-11-11 MED ORDER — PANTOPRAZOLE SODIUM 40 MG PO TBEC: 40.0000 mg | DELAYED_RELEASE_TABLET | Freq: Every day | ORAL | Status: DC

## 2011-11-11 MED ORDER — AMLODIPINE BESYLATE 10 MG PO TABS
10.0000 mg | ORAL_TABLET | Freq: Every day | ORAL | Status: DC
  Administered 2011-11-11: 10 mg via ORAL
  Filled 2011-11-11: qty 1

## 2011-11-11 MED ORDER — PANTOPRAZOLE SODIUM 40 MG PO TBEC
40.0000 mg | DELAYED_RELEASE_TABLET | Freq: Every day | ORAL | Status: DC
  Administered 2011-11-11: 40 mg via ORAL

## 2011-11-11 MED ORDER — INSULIN ASPART 100 UNIT/ML ~~LOC~~ SOLN
0.0000 [IU] | Freq: Three times a day (TID) | SUBCUTANEOUS | Status: DC
  Administered 2011-11-11: 11 [IU] via SUBCUTANEOUS
  Administered 2011-11-11 (×2): 5 [IU] via SUBCUTANEOUS
  Filled 2011-11-11: qty 3

## 2011-11-11 MED ORDER — HYDROCHLOROTHIAZIDE 25 MG PO TABS
25.0000 mg | ORAL_TABLET | Freq: Every day | ORAL | Status: DC
  Administered 2011-11-11: 25 mg via ORAL
  Filled 2011-11-11: qty 1

## 2011-11-11 MED ORDER — SODIUM CHLORIDE 0.9 % IJ SOLN: 3.0000 mL | Freq: Two times a day (BID) | INTRAMUSCULAR | Status: DC

## 2011-11-11 MED ORDER — HYDROCODONE-ACETAMINOPHEN 5-325 MG PO TABS
1.0000 | ORAL_TABLET | Freq: Four times a day (QID) | ORAL | Status: DC | PRN
  Administered 2011-11-11: 1 via ORAL
  Filled 2011-11-11: qty 1

## 2011-11-11 MED ORDER — ASPIRIN 325 MG PO TABS
325.0000 mg | ORAL_TABLET | Freq: Every day | ORAL | Status: DC
  Filled 2011-11-11: qty 1

## 2011-11-11 NOTE — Discharge Instructions (Signed)
You were in the hospital for mid chest pain that was most likely due to stomach pain. We are discharging you with a medicine called protonix that you can take once a day which should help with that type of pain.  We also made sure that it wasn't your heart causing your pain. Your cardiac enzymes were normal and did not show any evidence of heart attck.  Also, for your blood pressure medicine, we are going to increase your coreg to 1.5pills twice daily compared to the one pill you were taking. Please follow up with your regular doctor to check on your blood pressure and heart rate since we increased the medicine.

## 2011-11-11 NOTE — Progress Notes (Signed)
Patient was given information on advance directives. Wife was present at the bedside.

## 2011-11-11 NOTE — Progress Notes (Signed)
Saline locked IV fluids per Dr. Verlee Rossetti. Patients' IMDUR was moved up to 0800 per MD to administer. Will continue to monitor.

## 2011-11-11 NOTE — Progress Notes (Signed)
Family Medicine Teaching Service Subjective: Having some thirst. Patient c/o similar midsternal chest pain. No onsetting factors. Poor historian, unable to compare to chronic anginal pains. Patient states "yes lord!" about GI cocktail being helpful-but for reflux, not chest pain. Denies any confusion, HA, visual change, weakness, n/v, emesis.  Objective: Vital signs in last 24 hours: Temp:  [98.1 F (36.7 C)-98.3 F (36.8 C)] 98.3 F (36.8 C) (03/03 0114) Pulse Rate:  [75-85] 83  (03/03 0114) Resp:  [18-19] 19  (03/03 0114) BP: (144-162)/(76-99) 162/99 mmHg (03/03 0114) SpO2:  [96 %-100 %] 97 % (03/03 0114) Weight:  [227 lb 14.4 oz (103.375 kg)] 227 lb 14.4 oz (103.375 kg) (03/03 0114) Weight change:  Last BM Date: 11/11/11  Intake/Output from previous day: 03/02 0701 - 03/03 0700 In: -  Out: 1600 [Urine:1600] Intake/Output this shift: Total I/O In: -  Out: 1600 [Urine:1600]  Constitutional: He appears well-developed and well-nourished. No distress.  HENT:  Normocephalic and atraumatic.  Mouth/Throat: Oropharynx is clear and moist.  Eyes: EOMI. Pupils are equal, round, and reactive to light.  CV: RRR.  No murmur heard.  Respiratory: Effort normal and breath sounds normal. No respiratory distress. On CPAP for sleep. Mild tenderness to palpation on the lower sternum, reproducible  GI: Bowel sounds are normal. He exhibits distension. There is no tenderness. There is no rebound and no guarding.  Musculoskeletal: He exhibits no edema and no tenderness.  Neurological: He is alert and oriented to person, place, and time. Coordination normal.    Lab Results:  [facility] 11/10/11 1901  WBC 7.8  HGB 13.1  HCT 37.4*  PLT 206   BMET  Basename 11/11/11 0342 11/10/11 1901  NA 137 167*  K 4.6 5.6*  CL 104 81*  CO2 19 17*  GLUCOSE 321* 223*  BUN 20 23  CREATININE 1.47* 1.56*  CALCIUM 9.3 9.7   Urine OSM 558, Urine Na 108  Studies/Results: Dg Chest 2 View  11/10/2011   *RADIOLOGY REPORT*  Clinical Data: Chest pain  CHEST - 2 VIEW  Comparison: 07/09/2011 the  Findings: Prior median sternotomy and CABG procedure.  Heart size appears normal.  No pleural effusion or edema noted.  There is no airspace consolidation identified.  IMPRESSION:  1.  No active cardiopulmonary abnormalities.  Original Report Authenticated By: Angelita Ingles, M.D.    Medications:  I have reviewed the patient's current medications. Scheduled:   . amLODipine  10 mg Oral Daily  . aspirin EC  325 mg Oral Daily  . calcium gluconate  1 g Intravenous To ER  . carvedilol  25 mg Oral BID WC  . dextrose  25 mL Intravenous Once  . gabapentin  300 mg Oral TID  . gi cocktail  30 mL Oral Once  . heparin  5,000 Units Subcutaneous Q8H  . hydrochlorothiazide  25 mg Oral Daily  . insulin aspart  0-15 Units Subcutaneous TID WC  . insulin aspart  10 Units Intravenous Once  . isosorbide mononitrate  120 mg Oral Daily  . pantoprazole (PROTONIX) IV  40 mg Intravenous Q24H  . ranolazine  1,000 mg Oral BID  . simvastatin  20 mg Oral q1800  . sodium bicarbonate  50 mEq Intravenous Once  . sodium chloride  1,000 mL Intravenous Once  . sodium chloride  3 mL Intravenous Q12H  . sodium polystyrene  30 g Oral Once  . DISCONTD: aspirin  325 mg Oral Daily  . DISCONTD: calcium gluconate  1 g Intravenous Once  Continuous:   . DISCONTD: sodium chloride 250 mL/hr at 11/11/11 [id number]   [id number], acetaminophen, alum & mag hydroxide-simeth, HYDROcodone-acetaminophen, ondansetron (ZOFRAN) IV, ondansetron  Assessment/Plan: 51 year old male with history of CAD status post CABG , polycystic kidney disease, hypertension who presents with one-week intermittent chest/epigastric pains and new onset hypernatremia.   1. Hypernatremia. Resolved on BMET today. Somewhat quickly, but no CNS symptoms. Will DC IVF. F/u lab in 6 hours. Possibly polycystic kidney disease and nephrogenic diabetes insipidus. Urine  osmolality supportive of this. Will restart HCTZ and continue low sodium diet.   2. Chest pain. Intermittent and atypical for cardiac etiology, but has history of chronic angina. Also seems likely for GI etiology as he has indigestion and GI cocktail was helpful. Cardiac enzymes have been negative. Will continue cardiac medications including aspirin, renexa, imdur for chronic angina.Continue PPI.    3. Hyperkalemia. Resolved. Dosed with Kayexalate, sodium bicarb and calcium gluconate in ED. Hold Cozaar overnight and restart once in normal range.   4. Hypertension. Deteriorated. Will give IMDur early and continue home meds HCTZ, norvasc, coreg and restart cozaar today.   5. DM. Hyperglycemia mild. Hold metformin and start sliding scale insulin as needed.   6. FEN. Will DC IVF currently. May restart NS at lower rate if needed but f/u Na and K later today.   7. PPX. Protonix and heparin sq.   8. Dispo. Pending clinical improvement.   LOS: 1 day   Matthew Rowe 11/11/2011, 6:26 AM

## 2011-11-11 NOTE — Progress Notes (Signed)
Please see Dr Tyberius Ryner's H&P note for today's note.

## 2011-11-11 NOTE — H&P (Signed)
I have seen and examined this patient. I have discussed with Dr Verlee Rossetti.  I agree with their findings and plans as documented in their admission note.  Acute Issues  1. Epigastric/Substernal pain - Onset ~ 1 week ago, intermittent, duration 1 to 2 hours at a time, worse at night. No change with change in position. No nausea/vomiting. No Fever. No change with change in position or activity.  No change with eating. No Melena/hematochezia. - Taking Naprosyn twice a day chronically for anginal pain and other somatic complaints. - Complete relief of pain with oral GI cocktail last night.  - No pain this morning. - EKG: NSR, IVCD, poor -wave progression, p. Mitralis, RAE - CHEST XRAY: NAD, CABG sternotomy - US abdomen (11/07/11): Polycystic Kidneys, no evidence of biliary process. - Labs: Na 167 (though concurrent serum osm 296 mmol/Kg)   --> 137;  HCO3 17 ---> 19.  Troponin I < 0.3 x 2 - Working Diagnosis: Upper GI origin of pain (Location, NSAID exposure, relief with GI Cocktail)  - Other diagnostic possiblilities: Angina (Patient with known CAD, s/p CABG 2006, Cardiac cath 7/12 with single-vessel disease in distal LAD managed medically, pancreatitis, pain from Polycystic Kidney Disease from cyst infection, rupture, hemorrhage.   Plan:  Empiric 8-week treatment for possible PUD and/or esophagitis, check serum H.pylori titer, check lipase, urinalysis to look for hematuria, consult Dr Martinique (Card) for his opinion whether this pain represents active ischemic heart disease.  If pain recurs on PPI treatment, then get Non-contrasted CT to look for expanding renal cysts, hepatic cysts, and pancreatic cysts.   Would give patient Rx for oxycodone 5mg  1 every 6 as needed # 15, NO RF.   Patient counseled to stop NSAIDS and just use APAP chronically.

## 2011-11-11 NOTE — Treatment Plan (Signed)
Discussed with on-call for [place] cardiology Dr. Verl Blalock (patient under care of primary cardiologist Dr. Martinique) regarding patient's history of chronic uncontrolled angina. Goal to continue maximize medical therapy as not signs of ischemia on labs or ekg. Will restart home antihypertensives and increase coreg if there is no bradycardia and room for increase. Follow up symptoms as outpatient.  Luis Abed, MD [facility] Resident - PGY-2 11/11/2011 10:22 AM

## 2011-11-12 ENCOUNTER — Other Ambulatory Visit: Payer: Self-pay | Admitting: Family Medicine

## 2011-11-12 LAB — H. PYLORI ANTIBODY, IGG: H Pylori IgG: >8 {ISR} — ABNORMAL HIGH

## 2011-11-12 MED ORDER — DOXYCYCLINE HYCLATE 100 MG PO CAPS: 100.0000 mg | ORAL_CAPSULE | Freq: Two times a day (BID) | ORAL | Status: AC

## 2011-11-12 MED ORDER — LEVOFLOXACIN 500 MG PO TABS: 500.0000 mg | ORAL_TABLET | Freq: Every day | ORAL | Status: AC

## 2011-11-12 NOTE — Telephone Encounter (Signed)
[phone] Wife wants to hear something.  Problem is incured a bill at the radiology, also went to the hospital and has another bill.

## 2011-11-12 NOTE — Discharge Summary (Signed)
Physician Discharge Summary  Patient ID: Matthew SPIKES Sr. MRN: [id number] DOB/AGE: 15-Sep-1960 51 y.o.  Admit date: 11/10/2011 Discharge date: 11/12/2011  Admission Diagnoses:  Patient Active Problem List  Diagnoses  . Chest pain Epigastric pain   Hypernatremia  . Dyslipidemia  . Coronary artery disease s/p CABG  . Acute MI, inferior wall  . Diabetes mellitus  . HTN (hypertension)  . Chronic angina Polycystic Kidney Disease    Discharge Diagnoses:  Principal Problem:  *Hypernatremia, resolved Active Problems:  Chest pain GERD-epigastric pain  HTN (hypertension) Chronic angina Polycystic kidney disease   Discharged Condition: fair  Hospital Course: 51 yo male with history of CAD s/p CABG and most recent Cath 7/12 showing distal LAD with chronic angina presents with subacute epigastric and chest pains, found to have hypernatremia to 167.  1. Chest/epigastric pain. Patient was quite a poor historian, however had been having increase in chest pains similar to chronic angina. He was also quite bothered by some epigastric pain and indigestion for which he was undergoing workup via PCP recently. Patient underwent cardiac rule out with negative enzymes and EKG. Case discussed with cardiology and patient felt to be medical management, already on max therapy of Imdur and ranexa. His coreg was increased to 37.5 to attempt better beta blockade. Counseled to follow up with Dr. Martinique in next week. It was noted that patient's pain was much improved with GI cocktail, therefore he was started on PPI as well. H pylori testing drawn and results pending at time of DC.  2. Hypernatremia. Found to have sodium of 167 on admission, but normal serum osmolality and asymptomatic. Started in hypotonic saline replacement and follow up lab showed resolution to 137. Uncertain if this represents lab error or perhaps a new onset of nephrogenic DI in setting of polycystic kidney disease. Patient remained  asymptomatic with normal fluid intake and mental status. He will continue his diuretic HCTZ which would be helpful in case of DI. Advised to f/u for labs within one week with his PCP.   Consults: None  Significant Diagnostic Studies: labs: pending H pylori ag.  Treatments: IV hydration  Discharge Exam: Blood pressure 131/90, pulse 78, temperature 97 F (36.1 C), temperature source Oral, resp. rate 18, weight 227 lb 14.4 oz (103.375 kg), SpO2 97.00%. General appearance: alert, cooperative and no distress Resp: clear to auscultation bilaterally Chest wall: mild TTP in lower sternum, epigastrium GI: mild epigastric pain. Extremities: extremities normal, atraumatic, no cyanosis or edema Neurologic: Grossly normal  Disposition: 01-Home or Self Care  Discharge Orders    Future Appointments: Provider: Department: Dept Phone: Center:   11/28/2011 10:15 AM [name], MD [facility] [phone] None     Medication List  As of 11/12/2011  9:46 AM   STOP taking these medications         HYDROcodone-acetaminophen 5-500 MG per tablet         TAKE these medications         acetaminophen 500 MG tablet   Commonly known as: TYLENOL   Take 1,000 mg by mouth 2 (two) times daily as needed. For pain      amLODipine 10 MG tablet   Commonly known as: NORVASC   Take 1 tablet (10 mg total) by mouth daily.      aspirin 325 MG tablet   Take 325 mg by mouth daily.      carvedilol 25 MG tablet   Commonly known as: COREG   Take 1.5 tablets (37.5  mg total) by mouth 2 (two) times daily with a meal.      gabapentin 300 MG capsule   Commonly known as: NEURONTIN   Take 1 capsule (300 mg total) by mouth 3 (three) times daily.      hydrochlorothiazide 25 MG tablet   Commonly known as: HYDRODIURIL   Take 25 mg by mouth daily as needed. For blood pressure      isosorbide mononitrate 60 MG 24 hr tablet   Commonly known as: IMDUR   Take 2 tablets (120 mg total) by mouth every morning.       losartan 100 MG tablet   Commonly known as: COZAAR   Take 0.5 tablets (50 mg total) by mouth daily.      metFORMIN 500 MG tablet   Commonly known as: GLUCOPHAGE   Take 500-1,000 mg by mouth 3 (three) times daily. 1000 mg in the morning 8 am, 500 mg at 6 pm, and 500 mg at 11 pm  (if sugar is below 140 at 11 pm do not take 11 o'clock dose if sugar is over 250 at11 pm take 1000 mg dose)      nitroGLYCERIN 0.4 MG SL tablet   Commonly known as: NITROSTAT   Place 1 tablet (0.4 mg total) under the tongue every 5 (five) minutes as needed.      oxyCODONE-acetaminophen 5-325 MG per tablet   Commonly known as: PERCOCET   Take 1 tablet by mouth every 4 (four) hours as needed for pain.      pantoprazole 40 MG tablet   Commonly known as: PROTONIX   Take 1 tablet (40 mg total) by mouth daily.      PRAVACHOL 40 MG tablet   Generic drug: pravastatin   Take 1 tablet (40 mg total) by mouth every evening.      ranolazine 1000 MG SR tablet   Commonly known as: RANEXA   Take 1 tablet (1,000 mg total) by mouth 2 (two) times daily.      vitamin E 1000 UNIT capsule   Take 1,000 Units by mouth 2 (two) times daily.      zolpidem 10 MG tablet   Commonly known as: AMBIEN   Take 10 mg by mouth at bedtime as needed. For insomnia           Follow-up Information    Follow up with COPLAND,JESSICA, MD. Schedule an appointment as soon as possible for a visit in 1 week. (for stomach pain)       Follow up with [name], MD. Schedule an appointment as soon as possible for a visit in 1 week.   Contact information:   [id number] [place]          Signed: Luis Abed 11/12/2011, 9:46 AM

## 2011-11-12 NOTE — Discharge Summary (Signed)
I discussed with Dr Verlee Rossetti.  I agree with their plans documented in their  Discharge Note for today.

## 2011-11-12 NOTE — Telephone Encounter (Signed)
Pt wife called again today, Monday. Still has not received a call from [place] regarding her husband's sonogram results and he had to be hospitalized this weekend for stomach pain. Please call her at [phone].

## 2011-11-12 NOTE — Telephone Encounter (Signed)
Called back today

## 2011-11-13 ENCOUNTER — Other Ambulatory Visit: Payer: Self-pay | Admitting: Family Medicine

## 2011-11-13 ENCOUNTER — Telehealth: Payer: Self-pay

## 2011-11-13 MED ORDER — PANTOPRAZOLE SODIUM 40 MG PO TBEC: 40.0000 mg | DELAYED_RELEASE_TABLET | Freq: Two times a day (BID) | ORAL | Status: DC

## 2011-11-13 MED ORDER — HYDROCODONE-ACETAMINOPHEN 5-500 MG PO TABS: 1.0000 | ORAL_TABLET | Freq: Three times a day (TID) | ORAL | Status: AC | PRN

## 2011-11-13 NOTE — Telephone Encounter (Signed)
Pt called asking about Rxs for his Abxs, Oxycodone and protonix. Hosp had told him that he needs to get all Rxs from his PCP, and he says [facility] does not have any of the Rxs. Pt very short, asking he doesn't know what else to do to get his medications. I told pt that the computer shows that Dr Lorelei Pont sent in his 2 Abxs yesterday, and another MD had written for his Oxy and protonix on 3/3, but that I would check with pharm and see what the problem is. Checked with pharmacy and was told that the pt p/up both Abxs yesterday. They do not have the Rx for protonix, and pt did not bring Rx for Oxycodone written by the other MD. Original Oxycodone Rx was written for 5-325, one tab Q 4 hr prn #15. Dr Lorelei Pont, do you want to write for this and the protonix?

## 2011-11-13 NOTE — Progress Notes (Signed)
Taken care of with triple therapy treatment

## 2011-11-13 NOTE — Telephone Encounter (Signed)
I called Mr Matthew Rowe- he needed his protonix and something for pain.  I called in protonix and vicodin to his pharmacy- see orders only visit for this date.  He was happy and will let us know if he needs anything or if he is not better

## 2011-11-28 ENCOUNTER — Ambulatory Visit: Payer: Medicare Other | Admitting: Cardiology

## 2011-12-03 ENCOUNTER — Ambulatory Visit: Payer: Self-pay | Admitting: Nurse Practitioner

## 2012-01-02 ENCOUNTER — Ambulatory Visit: Payer: Medicare Other | Admitting: Nurse Practitioner

## 2012-02-12 ENCOUNTER — Other Ambulatory Visit: Payer: Self-pay | Admitting: Family Medicine

## 2012-03-26 ENCOUNTER — Telehealth: Payer: Self-pay

## 2012-03-26 NOTE — Telephone Encounter (Signed)
Advised pt that at 10/2010 OV w/Dr Copland she had noted that he needed to RTC soon for a DM f/up, so he is due for OV. Pt had ? About whether he can be seen since he has a balance on his acct. After checking w/Kelly in Billing advised pt that even though he does have a balance of $67.97, he is on a payment plan and has been making payments so he can come in for OV and be seen. Pt agreed and will be in this afternoon.

## 2012-03-26 NOTE — Telephone Encounter (Signed)
Patient would like to know if he will need an OV to get refills on his medications. There are several that he needs refills for but he named hydrochlorothiazide 25mg  and metformin. Best contact number is mobile [phone].

## 2012-03-27 ENCOUNTER — Ambulatory Visit (INDEPENDENT_AMBULATORY_CARE_PROVIDER_SITE_OTHER): Payer: Medicare Other | Admitting: Family Medicine

## 2012-03-27 VITALS — BP 136/88 | HR 80 | Temp 98.6°F | Resp 20 | Ht 65.5 in | Wt 220.6 lb

## 2012-03-27 DIAGNOSIS — E669 Obesity, unspecified: Secondary | ICD-10-CM

## 2012-03-27 DIAGNOSIS — I209 Angina pectoris, unspecified: Secondary | ICD-10-CM

## 2012-03-27 DIAGNOSIS — E119 Type 2 diabetes mellitus without complications: Secondary | ICD-10-CM

## 2012-03-27 DIAGNOSIS — I10 Essential (primary) hypertension: Secondary | ICD-10-CM

## 2012-03-27 DIAGNOSIS — G8929 Other chronic pain: Secondary | ICD-10-CM

## 2012-03-27 DIAGNOSIS — N189 Chronic kidney disease, unspecified: Secondary | ICD-10-CM

## 2012-03-27 LAB — POCT CBC
HCT, POC: 44.3 % (ref 43.5–53.7)
Hemoglobin: 14.2 g/dL (ref 14.1–18.1)
MCH, POC: 28.7 pg (ref 27–31.2)
MCV: 89.4 fL (ref 80–97)
RBC: 4.95 M/uL (ref 4.69–6.13)
WBC: 6.8 10*3/uL (ref 4.6–10.2)

## 2012-03-27 LAB — COMPREHENSIVE METABOLIC PANEL
AST: 16 U/L (ref 0–37)
Albumin: 4.6 g/dL (ref 3.5–5.2)
BUN: 22 mg/dL (ref 6–23)
CO2: 22 mEq/L (ref 19–32)
Calcium: 10 mg/dL (ref 8.4–10.5)
Chloride: 104 mEq/L (ref 96–112)
Glucose, Bld: 278 mg/dL — ABNORMAL HIGH (ref 70–99)
Potassium: 4.9 mEq/L (ref 3.5–5.3)

## 2012-03-27 LAB — PHOSPHORUS: Phosphorus: 4.2 mg/dL (ref 2.3–4.6)

## 2012-03-27 MED ORDER — CARVEDILOL 25 MG PO TABS: 25.0000 mg | ORAL_TABLET | Freq: Two times a day (BID) | ORAL | Status: DC

## 2012-03-27 MED ORDER — HYDROCODONE-ACETAMINOPHEN 5-500 MG PO TABS: 1.0000 | ORAL_TABLET | Freq: Three times a day (TID) | ORAL | Status: DC | PRN

## 2012-03-27 MED ORDER — INSULIN GLARGINE 100 UNIT/ML ~~LOC~~ SOLN: 20.0000 [IU] | Freq: Every day | SUBCUTANEOUS | Status: DC

## 2012-03-27 MED ORDER — HYDROCHLOROTHIAZIDE 25 MG PO TABS: 25.0000 mg | ORAL_TABLET | Freq: Every day | ORAL | Status: DC | PRN

## 2012-03-27 MED ORDER — CARVEDILOL 25 MG PO TABS: 37.5000 mg | ORAL_TABLET | Freq: Two times a day (BID) | ORAL | Status: DC

## 2012-03-27 MED ORDER — METFORMIN HCL 500 MG PO TABS: 500.0000 mg | ORAL_TABLET | Freq: Two times a day (BID) | ORAL | Status: DC

## 2012-03-27 NOTE — Progress Notes (Signed)
[facility] [address], [place] [address] [phone]  Date:  03/27/2012   Name:  Matthew Rowe   DOB:  [birth year]   MRN:  [id number]  PCP:  Lamar Blinks, MD    Chief Complaint: Follow-up   History of Present Illness:  Matthew Rowe. is a 51 y.o. very pleasant male patient who presents with the following:  Here today to recheck his DM.  He was last seen here in February of this year.  He will see Dr. Hassell Done (nephrology) today- he was told that he needed to get some labs drawn prior to this appt, but he is not sure what labs he is supposed to have.    He notes that his blood glucose "has its ups and downs," worse with excessive carbs.  He only uses his lantus "on occasion" for when his glucose is especially high.  He is also concerned about ED- he feels that this is better when metformin is "not in my system." He admits that he will take less of his metformin so "I can be a tom cat in the bedroom."  Matthew Rowe tends to be very concerned about his erectile function and mentions it at most visits as a primary concern.   He has seen urology for his ED (cannot take viagra, etc due to nitro use), but reports that he is not really "following up with my treatments."   He is supposed to be taking metformin 500, 2 am, 1 at 6pm and 1 or 2 at night. He is actually taking 1 BID.    He uses his lantus about 3x per week when he feels like he needs it- he uses 38 units when he does use it.    He notes that he sometimes has trouble walking long distances due to his angina, and wants to obtain an [place] [facility] card.  He already has a card for the state of [place].   Patient Active Problem List  Diagnosis  . Chest pain  . Renal failure, acute  . Dyslipidemia  . Coronary artery disease  . Acute MI, inferior wall  . Diabetes mellitus  . HTN (hypertension)  . Hypernatremia    Past Medical History  Diagnosis Date  . Chest pain     Chronic  . Renal failure, acute     . Diabetes mellitus   . Dyslipidemia   . Coronary artery disease     s/p cath in July 2012 showing single vessel distal LAD disease, managed medically  . Acute MI, inferior wall 1999  . Hx of CABG   . Abnormal stress test November 2011  . Hypertension   . CAD (coronary artery disease)   . Polycystic kidney disease     Past Surgical History  Procedure Date  . Cystectomy     off face  . Cardiac catheterization 1999 / 2002 / 2004 / 2004    with stent / at least 4 surgeries  . Coronary artery bypass graft 2006    History  Substance Use Topics  . Smoking status: Never Smoker   . Smokeless tobacco: Never Used  . Alcohol Use: No    Family History  Problem Relation Age of Onset  . Cancer Mother   . Diabetes Father   . Heart disease Father   . Anemia Mother   . Heart attack Father   . Heart failure Father   . Hyperlipidemia Father   . Hypertension Father   .  Kidney disease Mother   . Sudden death Father   . Kidney failure Sister 5  . Heart attack Sister   . Kidney disease Sister     Allergies  Allergen Reactions  . Ciprocin-Fluocin-Procin (Fluocinolone Acetonide)   . Clarithromycin   . Glimepiride (Amaryl)     Elevates liver function     Medication list has been reviewed and updated.  Current Outpatient Prescriptions on File Prior to Visit  Medication Sig Dispense Refill  . acetaminophen (TYLENOL) 500 MG tablet Take 1,000 mg by mouth 2 (two) times daily as needed. For pain      . amLODipine (NORVASC) 10 MG tablet Take 1 tablet (10 mg total) by mouth daily.  90 tablet  2  . aspirin 325 MG tablet Take 325 mg by mouth daily.        . carvedilol (COREG) 25 MG tablet Take 1.5 tablets (37.5 mg total) by mouth 2 (two) times daily with a meal.  180 tablet  2  . gabapentin (NEURONTIN) 300 MG capsule Take 1 capsule (300 mg total) by mouth 3 (three) times daily.  90 capsule  11  . hydrochlorothiazide 25 MG tablet Take 25 mg by mouth daily as needed. For blood pressure       . isosorbide mononitrate (IMDUR) 60 MG 24 hr tablet Take 2 tablets (120 mg total) by mouth every morning.  60 tablet  11  . losartan (COZAAR) 100 MG tablet Take 0.5 tablets (50 mg total) by mouth daily.  45 tablet  3  . metFORMIN (GLUCOPHAGE) 500 MG tablet TAKE TWO TABLETS BY MOUTH IN THE MORNING AT 8 AM AND ONE TABLET AT 6 PM AND ONE TABLET AT 11 PM. ( IF SUGAR IS BELOW 140 AT 11 PM DO NOT TAK  180 tablet  0  . nitroGLYCERIN (NITROSTAT) 0.4 MG SL tablet Place 1 tablet (0.4 mg total) under the tongue every 5 (five) minutes as needed.  100 tablet  11  . pantoprazole (PROTONIX) 40 MG tablet Take 1 tablet (40 mg total) by mouth 2 (two) times daily. Take BID for one week, then decrease to daily  60 tablet  3  . pravastatin (PRAVACHOL) 40 MG tablet Take 1 tablet (40 mg total) by mouth every evening.  30 tablet    . ranolazine (RANEXA) 1000 MG SR tablet Take 1 tablet (1,000 mg total) by mouth 2 (two) times daily.      . vitamin E 1000 UNIT capsule Take 1,000 Units by mouth 2 (two) times daily.        Marland Kitchen zolpidem (AMBIEN) 10 MG tablet Take 10 mg by mouth at bedtime as needed. For insomnia        Review of Systems:  As per HPI- otherwise negative.   Physical Examination: Filed Vitals:   03/27/12 0941  BP: 136/88  Pulse: 80  Temp: 98.6 F (37 C)  Resp: 20   Filed Vitals:   03/27/12 0941  Height: 5' 5.5" (1.664 m)  Weight: 220 lb 9.6 oz (100.064 kg)   Body mass index is 36.15 kg/(m^2). Ideal Body Weight: Weight in (lb) to have BMI = 25: 152.2   GEN: WDWN, NAD, Non-toxic, A & O x 3, obese HEENT: Atraumatic, Normocephalic. Neck supple. No masses, No LAD.  PEERL, EOMI, oropharynx wnl Ears and Nose: No external deformity. CV: RRR, No M/G/R. No JVD. No thrill. No extra heart sounds. PULM: CTA B, no wheezes, crackles, rhonchi. No retractions. No resp. distress. No accessory muscle use.  ABD: S, NT, ND, +BS. No rebound. No HSM. EXTR: No c/c/e NEURO Normal gait.  PSYCH: Normally interactive.  Conversant. Not depressed or anxious appearing.  Calm demeanor.  Foot exam normal  Results for orders placed in visit on 03/27/12  POCT CBC      Component Value Range   WBC 6.8  4.6 - 10.2 K/uL   Lymph, poc 2.2  0.6 - 3.4   POC LYMPH PERCENT 32.4  10 - 50 %L   MID (cbc) 0.5  0 - 0.9   POC MID % 7.5  0 - 12 %M   POC Granulocyte 4.1  2 - 6.9   Granulocyte percent 60.1  37 - 80 %G   RBC 4.95  4.69 - 6.13 M/uL   Hemoglobin 14.2  14.1 - 18.1 g/dL   HCT, POC 44.3  43.5 - 53.7 %   MCV 89.4  80 - 97 fL   MCH, POC 28.7  27 - 31.2 pg   MCHC 32.1  31.8 - 35.4 g/dL   RDW, POC 13.6     Platelet Count, POC 267  142 - 424 K/uL   MPV 10.5  0 - 99.8 fL  POCT GLYCOSYLATED HEMOGLOBIN (HGB A1C)      Component Value Range   Hemoglobin A1C 8.8     Assessment and Plan: 1. Diabetes mellitus type II  POCT glycosylated hemoglobin (Hb A1C), Comprehensive metabolic panel, LDL cholesterol, direct, metFORMIN (GLUCOPHAGE) 500 MG tablet, insulin glargine (LANTUS SOLOSTAR) 100 UNIT/ML injection  2. Chronic renal disease  POCT CBC, Comprehensive metabolic panel, Phosphorus  3. Obesity  LDL cholesterol, direct  4. Angina pectoris    5. HTN (hypertension)  hydrochlorothiazide (HYDRODIURIL) 25 MG tablet, carvedilol (COREG) 25 MG tablet, DISCONTINUED: carvedilol (COREG) 25 MG tablet  6. Chronic pain  HYDROcodone-acetaminophen (VICODIN) 5-500 MG per tablet   Went over his DM control regimen in detail.  He is not taking his medications because he is afraid they will cause problems with his erections.  We have gone over how controlling his glucose will prevent microvascular damage and help to protect his erectile function in the past.  He feels comfortable taking metformin 500 BID.  He will take this amount of metformin, and also start taking his lantus regularly- will start with 20 units EVERY day.   Explained that taking lantus just a few times a week is not a good way to control his glucose.  We will plan to touch base  by phone when the rest of his labs are available to see how this is going.    Called Dr. Cherlyn [facility] and ordered CMP and phosphorus per their request. Will send a copy to them when available.    BP control ok.  Continue medications.   Gave a few more vicodin for his chronic back and chest pain per his request.  He is continuing to follow- up with cardiology and use nitro for acute angina.     Lamar Blinks, MD

## 2012-03-28 ENCOUNTER — Encounter: Payer: Self-pay | Admitting: Family Medicine

## 2012-04-01 ENCOUNTER — Telehealth: Payer: Self-pay

## 2012-05-07 ENCOUNTER — Telehealth: Payer: Self-pay

## 2012-05-07 NOTE — Telephone Encounter (Signed)
Patient called no answer.Left message on personal voice mail due for appointment with Dr.Jordan,call office to schedule.

## 2012-05-29 ENCOUNTER — Encounter: Payer: Self-pay | Admitting: Physician Assistant

## 2012-05-29 ENCOUNTER — Ambulatory Visit (INDEPENDENT_AMBULATORY_CARE_PROVIDER_SITE_OTHER): Payer: Medicare Other | Admitting: Physician Assistant

## 2012-05-29 VITALS — BP 142/92 | HR 88 | Ht 65.5 in | Wt 224.0 lb

## 2012-05-29 DIAGNOSIS — N189 Chronic kidney disease, unspecified: Secondary | ICD-10-CM

## 2012-05-29 DIAGNOSIS — R079 Chest pain, unspecified: Secondary | ICD-10-CM

## 2012-05-29 DIAGNOSIS — I2510 Atherosclerotic heart disease of native coronary artery without angina pectoris: Secondary | ICD-10-CM

## 2012-05-29 DIAGNOSIS — E785 Hyperlipidemia, unspecified: Secondary | ICD-10-CM

## 2012-05-29 DIAGNOSIS — I10 Essential (primary) hypertension: Secondary | ICD-10-CM

## 2012-05-29 MED ORDER — RANOLAZINE ER 500 MG PO TB12: ORAL_TABLET | ORAL | Status: DC

## 2012-05-29 MED ORDER — PANTOPRAZOLE SODIUM 40 MG PO TBEC: 40.0000 mg | DELAYED_RELEASE_TABLET | Freq: Every day | ORAL | Status: DC

## 2012-05-29 MED ORDER — ISOSORBIDE MONONITRATE ER 60 MG PO TB24: 60.0000 mg | ORAL_TABLET | Freq: Two times a day (BID) | ORAL | Status: DC

## 2012-05-29 NOTE — Progress Notes (Signed)
[place], [place]  [address] Phone: [phone] Fax:  [phone]  Date:  05/29/2012   Name:  Matthew Rowe   DOB:  Apr 01, 1961   MRN:  [id number]  PCP:  Lamar Blinks, MD  Primary Cardiologist:  Dr. [name]  Primary Electrophysiologist:  None    History of Present Illness: Matthew Rowe. is a 51 y.o. male who returns for evaluation of chest pain.  He has a hx of CAD, s/p CABG 2006 in [place] [facility]), s/p prior extensive stenting of the LAD with subsequent occlusion of the distal vessel, DM2, HTH, HL, CKD.  He has chronic daily angina.  He is on amlodipine, Ranexa, Isosorbide, Neurontin.  Last seen by Dr. [name] 12/12.  Isosorbide adjusted at that time.  He has had SEs to the higher dose of Ranexa.  Patient notes increased chest pain over the last 2 weeks.  Sugars have been higher which typically exacerbates his symptoms.  His chronic symptom is intermittent chest pain at night and sometimes with exertion.  Currently, he is getting chest pain with minimal activity as well as at night.  It is a pressure with some assoc L arm pain, dyspnea, diaphoresis.  He takes 3 NTG, 2 ASA and vicodin x 1 often for relief.  He had similar symptoms prior to his cath one year ago that demonstrated patent RIMA-RCA and chronically occluded dLAD stent.  Med Rx was continued.  He sleeps on 3 pillows chronically. He sleeps with CPAP. He denies LE edema. He denies syncope  Labs (7/13):   K 4.9, creatinine 1.75, Hgb 14.2  Past Medical History  Diagnosis Date  . Chest pain     Chronic  . CKD (chronic kidney disease)   . DM2 (diabetes mellitus, type 2)   . Dyslipidemia   . Coronary artery disease     a. s/p INF MI 1999=>PCI of RCA;  b.  prior extensive stenting of LAD with subsequent stent occlusion distally;  c.  s/p CABG with RIMA to RCA in 2006;  d.  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 Acute MI, inferior wall 1999  . Hx of CABG     2006 in [place]  . Abnormal stress test November 2011    Myoview: apical infarct with ? small focus of inducible ischemia dInf wall, and Inf Apex, EF 46%  . Hypertension   . Polycystic kidney disease   . Hx of echocardiogram     a. Echo 3/12:  mild LVH, EF 55-60%, trivial AI    Current Outpatient Prescriptions  Medication Sig Dispense Refill  . acetaminophen (TYLENOL) 500 MG tablet Take 1,000 mg by mouth 2 (two) times daily as needed. For pain      . amLODipine (NORVASC) 10 MG tablet Take 1 tablet (10 mg total) by mouth daily.  90 tablet  2  . aspirin 325 MG tablet Take 325 mg by mouth daily.        . carvedilol (COREG) 25 MG tablet Take 1 tablet (25 mg total) by mouth 2 (two) times daily with a meal.  180 tablet  3  . gabapentin (NEURONTIN) 300 MG capsule Take 1 capsule (300 mg total) by mouth 3 (three) times daily.  90 capsule  11  . hydrochlorothiazide (HYDRODIURIL) 25  MG tablet Take 1 tablet (25 mg total) by mouth daily as needed. For blood pressure  90 tablet  3  . HYDROcodone-acetaminophen (VICODIN) 5-500 MG per tablet Take 1 tablet by mouth every 8 (eight) hours as needed.  15 tablet  0  . insulin glargine (LANTUS SOLOSTAR) 100 UNIT/ML injection Inject 20 Units into the skin at bedtime.  5 pen  PRN  . isosorbide mononitrate (IMDUR) 60 MG 24 hr tablet Take 2 tablets (120 mg total) by mouth every morning.  60 tablet  11  . losartan (COZAAR) 100 MG tablet Take 0.5 tablets (50 mg total) by mouth daily.  45 tablet  3  . metFORMIN (GLUCOPHAGE) 500 MG tablet Take 1 tablet (500 mg total) by mouth 2 (two) times daily.  180 tablet  0  . nitroGLYCERIN (NITROSTAT) 0.4 MG SL tablet Place 1 tablet (0.4 mg total) under the tongue every 5 (five) minutes as needed.  100 tablet  11  . pantoprazole (PROTONIX) 40 MG tablet Take 1 tablet (40 mg total) by mouth  2 (two) times daily. Take BID for one week, then decrease to daily  60 tablet  3  . pravastatin (PRAVACHOL) 40 MG tablet Take 1 tablet (40 mg total) by mouth every evening.  30 tablet    . ranolazine (RANEXA) 1000 MG SR tablet Take 1 tablet (1,000 mg total) by mouth 2 (two) times daily.      . vitamin E 1000 UNIT capsule Take 1,000 Units by mouth 2 (two) times daily.        Marland Kitchen zolpidem (AMBIEN) 10 MG tablet Take 10 mg by mouth at bedtime as needed. For insomnia        Allergies: Allergies  Allergen Reactions  . Ciprocin-Fluocin-Procin (Fluocinolone Acetonide)   . Clarithromycin   . Glimepiride (Amaryl)     Elevates liver function     History  Substance Use Topics  . Smoking status: Never Smoker   . Smokeless tobacco: Never Used  . Alcohol Use: No     ROS:  Please see the history of present illness.   He has had increased symptoms of indigestion recently. He denies melena or hematochezia. No fevers, chills, cough.  All other systems reviewed and negative.   PHYSICAL EXAM: VS:  BP 142/92  Pulse 88  Ht 5' 5.5" (1.664 m)  Wt 224 lb (101.606 kg)  BMI 36.71 kg/m2 Well nourished, well developed, in no acute distress HEENT: normal Neck: no JVD Cardiac:  normal S1, S2; RRR; no murmur Lungs:  clear to auscultation bilaterally, no wheezing, rhonchi or rales Abd: soft, nontender, no hepatomegaly Ext: no edema Skin: warm and dry Neuro:  CNs 2-12 intact, no focal abnormalities noted  EKG:  Sinus rhythm, heart rate 88, left axis deviation, poor with progression, nonspecific ST-T wave changes      ASSESSMENT AND PLAN:  1. Chest Pain: He has chronic chest pain. Over the last 2 weeks it has increased in intensity and frequency. He has experienced this in the past. His last heart catheterization demonstrated stable anatomy and medical therapy was continued. He does have chronic renal insufficiency. We had a long discussion regarding whether or not to pursue with advance medical therapy  versus cardiac catheterization. We decided to proceed with advance medical therapy. He is only taking isosorbide 60 mg daily. He only takes Ranexa 500 mg 1/2 daily. I have asked him to take Ranexa 500 mg 1/2 tab twice a day. I have asked him to increase his  isosorbide to 120 mg daily. He also has some increased indigestion. I have asked him to take Protonix 40 mg daily for 2 weeks (he typically takes it when necessary). Given his renal insufficiency, I have suggested that we obtain an ETT-Myoview. If this is overall low risk, we can certainly continue with advance medical therapy.  2. Coronary Artery Disease: Adjust medications as noted above. Continue aspirin and statin.  3. Hypertension: Adjust isosorbide as noted.  4. Hyperlipidemia: Continue pravastatin.  5. Chronic Kidney Disease: He is followed by nephrology.  6. Disposition: As noted above, his medications will be adjusted. I will bring him back in followup in 2 weeks. The patient does not desire to followup with Dr. Martinique. I reviewed this with Dr. Aundra Dubin, who was in the office today. He agrees to see him in followup. Therefore, he will see Dr. Aundra Dubin in 2 weeks.  Signed, Richardson Dopp, PA-C  8:56 AM 05/29/2012

## 2012-05-29 NOTE — Patient Instructions (Addendum)
Your physician has recommended you make the following change in your medication:  1.)  DECREASE RANEXA TO (250 MG) ONE HALF TABLET TWICE A DAY 2.)  INCREASE IMDUR (60 MG) TWO TABLETS DAILY 3.)  TAKE PROTONIX (40 MG) TWICE A DAY FOR TWO TWO WEEKS THAN DECREASE TO (40 MG ) ONE TABLET DAILY   Your physician recommends that you schedule a follow-up appointment in: [place]  Your physician has requested that you have en exercise stress myoview. DX: 786.50 For further information please visit [email or web address]. Please follow instruction sheet, as given.

## 2012-06-03 ENCOUNTER — Encounter (HOSPITAL_COMMUNITY): Payer: Medicare Other

## 2012-06-05 ENCOUNTER — Ambulatory Visit (HOSPITAL_COMMUNITY): Payer: Medicare Other | Attending: Cardiovascular Disease | Admitting: Radiology

## 2012-06-05 VITALS — BP 139/96 | Ht 65.0 in | Wt 216.0 lb

## 2012-06-05 DIAGNOSIS — I10 Essential (primary) hypertension: Secondary | ICD-10-CM | POA: Insufficient documentation

## 2012-06-05 DIAGNOSIS — E119 Type 2 diabetes mellitus without complications: Secondary | ICD-10-CM | POA: Insufficient documentation

## 2012-06-05 DIAGNOSIS — R0989 Other specified symptoms and signs involving the circulatory and respiratory systems: Secondary | ICD-10-CM | POA: Insufficient documentation

## 2012-06-05 DIAGNOSIS — R61 Generalized hyperhidrosis: Secondary | ICD-10-CM | POA: Insufficient documentation

## 2012-06-05 DIAGNOSIS — R42 Dizziness and giddiness: Secondary | ICD-10-CM | POA: Insufficient documentation

## 2012-06-05 DIAGNOSIS — R0609 Other forms of dyspnea: Secondary | ICD-10-CM | POA: Insufficient documentation

## 2012-06-05 DIAGNOSIS — R079 Chest pain, unspecified: Secondary | ICD-10-CM

## 2012-06-05 DIAGNOSIS — R0602 Shortness of breath: Secondary | ICD-10-CM

## 2012-06-05 DIAGNOSIS — I2510 Atherosclerotic heart disease of native coronary artery without angina pectoris: Secondary | ICD-10-CM | POA: Insufficient documentation

## 2012-06-05 MED ORDER — TECHNETIUM TC 99M SESTAMIBI GENERIC - CARDIOLITE
11.0000 | Freq: Once | INTRAVENOUS | Status: AC | PRN
  Administered 2012-06-05: 11 via INTRAVENOUS

## 2012-06-05 NOTE — Progress Notes (Signed)
[facility] [id number] [place] [address] [phone]  Cardiology Nuclear Med Study  Matthew Rowe. is a 51 y.o. male     MRN : [id number]     DOB: 13-Jun-1961  Procedure Date: 06/09/2012  Nuclear Med Background Indication for Stress Test:  Evaluation for Ischemia, Graft Patency, Stent Patency and PTCA Patency History:  '99 MI-Inferior MI PCI of RCA '06: CABG, '11 [id number] Infarct EF: Infarct EF: 46% '12ECHO: mild LVH EF: 55-60% grafts patent N/O CAD  Cardiac Risk Factors: Family History - CAD, Hypertension, IDDM Type 2 and Lipids  Symptoms:  Chest Pain, Diaphoresis, Dizziness and DOE   Nuclear Pre-Procedure Caffeine/Decaff Intake:  11:30am NPO After: 6:00am   Lungs:  clear O2 Sat: 98% on room air. IV 0.9% NS with Angio Cath:  22g  IV Site: R Hand  IV Started by:  Eliezer Lofts, EMT-P  Chest Size (in):  48 Cup Size: n/a  Height: 5\' 5"  (1.651 m)  Weight:  216 lb (97.977 kg)  BMI:  Body mass index is 35.94 kg/(m^2). Tech Comments:  Coreg held > 24 hours, per patient. This patient had severe nausea and vomtting so he was reversed with Aminophylline 75 mg IV with all symptoms resolved.    Nuclear Med Study 1 or 2 day study: 1 day  Stress Test Type:  Carlton Adam  Reading MD: Matthew Coco, MD  Order Authorizing Provider:  B.Crenshaw  Resting Radionuclide: Technetium 69m Sestamibi  Resting Radionuclide Dose: 11.0 mCi   Stress Radionuclide:  Technetium 39m Sestamibi  Stress Radionuclide Dose: 33.0 mCi           Stress Protocol Rest HR: 79 Stress HR: 103  Rest BP: 139/96 Stress BP: 220/86  Exercise Time (min): n/a METS: n/a   Predicted Max HR: 168 bpm % Max HR: 61.31 bpm Rate Pressure Product: 18952   Dose of Adenosine (mg):  n/a Dose of Lexiscan: 0.4 mg  Dose of Atropine (mg): n/a Dose of Dobutamine: n/a mcg/kg/min (at max HR)  Stress Test Technologist: Perrin Maltese, EMT-P  Nuclear Technologist:  Charlton Amor, CNMT     Rest Procedure:  Myocardial perfusion imaging was performed at rest 45 minutes following the intravenous administration of Technetium 58m Sestamibi. Rest ECG: Sinus Bradycardia  Stress Procedure:  The patient received IV Lexiscan 0.4 mg over 15-seconds.  Technetium 59m Sestamibi injected at 30-seconds.  There were no significant changes, sob, chest tightness, lt. Headed. Flushed, nausea/vomitting, and rare pvcs with Lexiscan.  Quantitative spect images were obtained after a 45 minute delay. Stress ECG: No significant change from baseline ECG  QPS Raw Data Images:  Normal; no motion artifact; normal heart/lung ratio. Stress Images:  There is decreased uptake in the inferior wall and at the apex. Rest Images:  There is decreased uptake in the inferior wall and at the apex. Subtraction (SDS):  Evidence of old apical and old inferior wall scar, each with partial reversiblity. Transient Ischemic Dilatation (Normal <1.22):  1.13 Lung/Heart Ratio (Normal <0.45):  0.35  Quantitative Gated Spect Images QGS EDV:  147 ml QGS ESV:  72 ml  Impression Exercise Capacity:  Poor exercise capacity. GXT on 06/05/12 had to be converted to [name] on 06/08/12 (caffeine). BP Response:  Hypertensive blood pressure response. Clinical Symptoms:  Mild chest pain/dyspnea. ECG Impression:  No significant ST segment change suggestive of ischemia. Comparison with Prior Nuclear Study: No images to compare  Overall Impression:  Abnormal stress nuclear  study. There is a small size apical scar of moderate severity and minimal reversibility.  There is a moderate inferior wall scar involving entire inferior wall from apex to base with moderate reversibility.  LV Ejection Fraction: 47%.  LV Wall Motion:  There is apical hypokinesis.  Matthew Rowe

## 2012-06-07 ENCOUNTER — Other Ambulatory Visit: Payer: Self-pay | Admitting: *Deleted

## 2012-06-07 MED ORDER — PRAVASTATIN SODIUM 40 MG PO TABS: 40.0000 mg | ORAL_TABLET | Freq: Every evening | ORAL | Status: DC

## 2012-06-09 ENCOUNTER — Ambulatory Visit (HOSPITAL_COMMUNITY): Payer: Medicare Other | Attending: Cardiology | Admitting: Radiology

## 2012-06-09 DIAGNOSIS — R0989 Other specified symptoms and signs involving the circulatory and respiratory systems: Secondary | ICD-10-CM

## 2012-06-09 DIAGNOSIS — I2510 Atherosclerotic heart disease of native coronary artery without angina pectoris: Secondary | ICD-10-CM | POA: Insufficient documentation

## 2012-06-09 MED ORDER — TECHNETIUM TC 99M TETROFOSMIN IV KIT: 33.0000 | PACK | Freq: Once | INTRAVENOUS | Status: DC | PRN

## 2012-06-09 MED ORDER — REGADENOSON 0.4 MG/5ML IV SOLN
0.4000 mg | Freq: Once | INTRAVENOUS | Status: AC
  Administered 2012-06-09: 0.4 mg via INTRAVENOUS

## 2012-06-09 MED ORDER — AMINOPHYLLINE 25 MG/ML IV SOLN
75.0000 mg | Freq: Once | INTRAVENOUS | Status: AC
  Administered 2012-06-09: 75 mg via INTRAVENOUS

## 2012-06-09 MED ORDER — TECHNETIUM TC 99M SESTAMIBI GENERIC - CARDIOLITE
33.0000 | Freq: Once | INTRAVENOUS | Status: AC | PRN
  Administered 2012-06-09: 33 via INTRAVENOUS

## 2012-06-10 ENCOUNTER — Encounter: Payer: Self-pay | Admitting: Physician Assistant

## 2012-06-11 ENCOUNTER — Telehealth: Payer: Self-pay | Admitting: *Deleted

## 2012-06-11 ENCOUNTER — Ambulatory Visit: Payer: Medicare Other | Admitting: Cardiology

## 2012-06-11 NOTE — Telephone Encounter (Signed)
Message copied by Michae Kava on Wed Jun 11, 2012 11:08 AM ------      Message from: [place], [place] T      Created: Tue Jun 10, 2012  4:55 PM       As noted by Dr. [name]       Please notify patient      Continue with current treatment plan.      Richardson Dopp, PA-C  4:54 PM 06/10/2012

## 2012-06-11 NOTE — Telephone Encounter (Signed)
lmptcb to go over results from [facility]

## 2012-06-12 NOTE — Telephone Encounter (Signed)
pt notified about myoview results today w/verbal understanding

## 2012-06-27 ENCOUNTER — Ambulatory Visit (INDEPENDENT_AMBULATORY_CARE_PROVIDER_SITE_OTHER): Payer: Medicare Other | Admitting: Cardiology

## 2012-06-27 ENCOUNTER — Encounter: Payer: Self-pay | Admitting: Cardiology

## 2012-06-27 VITALS — BP 145/95 | HR 82 | Ht 65.0 in | Wt 221.0 lb

## 2012-06-27 DIAGNOSIS — I10 Essential (primary) hypertension: Secondary | ICD-10-CM

## 2012-06-27 DIAGNOSIS — I2510 Atherosclerotic heart disease of native coronary artery without angina pectoris: Secondary | ICD-10-CM

## 2012-06-27 DIAGNOSIS — E785 Hyperlipidemia, unspecified: Secondary | ICD-10-CM

## 2012-06-27 DIAGNOSIS — I25810 Atherosclerosis of coronary artery bypass graft(s) without angina pectoris: Secondary | ICD-10-CM

## 2012-06-27 MED ORDER — CARVEDILOL 25 MG PO TABS: ORAL_TABLET | ORAL | Status: DC

## 2012-06-27 NOTE — Patient Instructions (Addendum)
Increase coreg(carvedilol) to 37.5 mg two times a day. This will be one and one-half 25mg  tablets two times a day.   Your physician recommends that you schedule a follow-up appointment in: 3 months with Dr Aundra Dubin.

## 2012-06-28 NOTE — Progress Notes (Signed)
Patient ID: Matthew Englin., male   DOB: Mar 12, 1961, 51 y.o.   MRN: [id number] PCP: Dr. Lorelei Pont  51 yo with history CAD s/p CABG (RIMA to RCA, anomalous RCA off left cusp), occlusion of the distal LAD, and chronic angina presents for cardiology followup.  Patient has been seen by Dr. Martinique in the past and is seen by me for the first time today.  Patient has had chronic angina x years.  At last appointment with Richardson Dopp, he reported increased angina.  Imdur was increased to 120 mg daily and he restarted Ranexa, which he takes 250 mg either qday or bid.  He has been unable to tolerate more Ranexa because it makes him too sedated.  Lexiscan myoview showed apical and inferior scar with mild peri-infarct ischemia, similar to studies in the past.  With the medication changes, patient has been feeling better.  He is now getting chest tightness only with moderate to heavy exertion such as walking fast or sex.  No chest pain at rest or with mild activity.  Reflux is improved also on increased Protonix.   Labs (7/13): K 4.9, creatinine 1.75, LDL 63  PMH: 1. CAD: CABG 2006 at [facility] with [place].  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  Patient has had prior extensive PCI to LAD with occlusion of the distal LAD.  LHC (7/12): proximal LAD stent, extensive mid-distal LAD stenting, 60-70% mid LAD instent restenosis, subtotal distal LAD instent restenosis, LCFx without significant disease, anomalous RCA off left cusp with mild disease, patent RIMA-RCA.  Echo (3/12): EF 55-60%, trivial AI, mild LVH.  Lexiscan myoview (9/13) with small apical scar, moderate inferior scar from base to apex with some reversibility, EF 47% with apical hypokinesis (similar to prior study).  Chronic angina.  2. OSA: CPAP. 3. Type II diabetes 4. Hyperlipidemia 5. HTN 6. CKD: polycystic kidney disease.  7. Ischemic cardiomyopathy: EF 55-60% by echo in 3/12, EF 47% with apical hypokinesis in 9/13.   SH:  Nonsmoker.  Married, lives in [place].  On disability.   FH: Multiple family members with MIs.   ROS: All systems reviewed and negative except as per HPI.   Current Outpatient Prescriptions  Medication Sig Dispense Refill  . acetaminophen (TYLENOL) 500 MG tablet Take 1,000 mg by mouth 2 (two) times daily as needed. For pain      . amLODipine (NORVASC) 10 MG tablet Take 1 tablet (10 mg total) by mouth daily.  90 tablet  2  . aspirin 325 MG tablet Take 325 mg by mouth daily.        Marland Kitchen gabapentin (NEURONTIN) 300 MG capsule Take 1 capsule (300 mg total) by mouth 3 (three) times daily.  90 capsule  11  . hydrochlorothiazide (HYDRODIURIL) 25 MG tablet Take 1 tablet (25 mg total) by mouth daily as needed. For blood pressure  90 tablet  3  . HYDROcodone-acetaminophen (VICODIN) 5-500 MG per tablet Take 1 tablet by mouth every 8 (eight) hours as needed.  15 tablet  0  . insulin glargine (LANTUS SOLOSTAR) 100 UNIT/ML injection Inject 20 Units into the skin at bedtime.  5 pen  PRN  . isosorbide mononitrate (IMDUR) 60 MG 24 hr tablet Take 1 tablet (60 mg total) by mouth 2 (two) times daily.  60 tablet  11  . losartan (COZAAR) 100 MG tablet Take 0.5 tablets (50 mg total) by mouth daily.  45 tablet  3  . metFORMIN (GLUCOPHAGE) 500 MG  tablet Take 1 tablet (500 mg total) by mouth 2 (two) times daily.  180 tablet  0  . nitroGLYCERIN (NITROSTAT) 0.4 MG SL tablet Place 1 tablet (0.4 mg total) under the tongue every 5 (five) minutes as needed.  100 tablet  11  . pantoprazole (PROTONIX) 40 MG tablet Take 1 tablet (40 mg total) by mouth daily. Take BID for one week, then decrease to daily  30 tablet  9  . pravastatin (PRAVACHOL) 40 MG tablet Take 1 tablet (40 mg total) by mouth every evening.  30 tablet  1  . ranolazine (RANEXA) 500 MG 12 hr tablet Take one half tablet (250 mg) twice a day      . vitamin E 1000 UNIT capsule Take 1,000 Units by mouth 2 (two) times daily.        Marland Kitchen zolpidem (AMBIEN) 10 MG tablet Take  10 mg by mouth at bedtime as needed. For insomnia      . carvedilol (COREG) 25 MG tablet 1 and 1/2 tablets (total 37.5mg ) two times a day  270 tablet  11    BP 145/95  Pulse 82  Ht 5\' 5"  (1.651 m)  Wt 221 lb (100.245 kg)  BMI 36.78 kg/m2 General: NAD, obese.  Neck: No JVD, no thyromegaly or thyroid nodule.  Lungs: Clear to auscultation bilaterally with normal respiratory effort. CV: Nondisplaced PMI.  Heart regular S1/S2, no S3/S4, no murmur.  No peripheral edema.  No carotid bruit.  Normal pedal pulses.  Abdomen: Soft, nontender, no hepatosplenomegaly, no distention.  Neurologic: Alert and oriented x 3.  Psych: Normal affect. Extremities: No clubbing or cyanosis.   Assessment/Plan: 1. CAD: Chronic angina.  Improved with recent medication adjustments.  He is unable to tolerate a dose of Ranexa greater than 250 mg.  He is on maximal Imdur and amlodipine.  I will increase Coreg to 37.5 mg bid (can increase due to his size).  Myoview does not appear to indicate any new disease, he is doing better, and he has CKD, so would hold off on cath.  EECP may be a good option for him, but will need to research whether there is anywhere locally where this is done.  2. HTN: BP mildly elevated.  As above, plan to increase Coreg.  3. Hyperlipidemia: LDL at goal when checked recently (< 70).  4. CKD: Patient has APKD.    [name] 06/28/2012

## 2012-08-05 ENCOUNTER — Telehealth: Payer: Self-pay

## 2012-08-05 DIAGNOSIS — G8929 Other chronic pain: Secondary | ICD-10-CM

## 2012-08-05 NOTE — Telephone Encounter (Signed)
[facility] called to request a refill on patients ambien 10mg  and vicodin 5/500mg  from dr copland.  States patient is trying to change from [facility] to [facility] and the rx's have no more refills   Best number is [facility] [phone]

## 2012-08-06 MED ORDER — ZOLPIDEM TARTRATE 10 MG PO TABS: 10.0000 mg | ORAL_TABLET | Freq: Every evening | ORAL | Status: DC | PRN

## 2012-08-06 MED ORDER — HYDROCODONE-ACETAMINOPHEN 5-500 MG PO TABS: 1.0000 | ORAL_TABLET | Freq: Three times a day (TID) | ORAL | Status: DC | PRN

## 2012-08-06 NOTE — Telephone Encounter (Signed)
I pended these for you. Patient was here last to see you in July/ please advise on renewals.

## 2012-08-06 NOTE — Telephone Encounter (Signed)
I filled the pended rx- we will send to his pharmacy.    Meds ordered this encounter  Medications  . zolpidem (AMBIEN) 10 MG tablet    Sig: Take 1 tablet (10 mg total) by mouth at bedtime as needed. For insomnia    Dispense:  30 tablet    Refill:  0  . HYDROcodone-acetaminophen (VICODIN) 5-500 MG per tablet    Sig: Take 1 tablet by mouth every 8 (eight) hours as needed.    Dispense:  15 tablet    Refill:  0

## 2012-08-10 ENCOUNTER — Other Ambulatory Visit: Payer: Self-pay | Admitting: Family Medicine

## 2012-08-13 ENCOUNTER — Telehealth: Payer: Self-pay

## 2012-08-13 NOTE — Telephone Encounter (Signed)
I have not seen him since July and we gave him 15 pills just about 10 days ago- has something changed where he needs this medication more often?  He is due for an OV before we can rx more.  Thanks!  [place]

## 2012-08-13 NOTE — Telephone Encounter (Signed)
Pt is requesting hydrocodone/ 5-500   Call back number is [phone]

## 2012-08-14 NOTE — Telephone Encounter (Signed)
I have called patient to advise. He states he takes it for chest pain. He will try to come in today or tomorrow. He is urged to come in today, for the chest pain.

## 2012-09-10 HISTORY — PX: CARDIAC CATHETERIZATION: SHX172

## 2012-09-12 ENCOUNTER — Other Ambulatory Visit: Payer: Self-pay

## 2012-09-12 MED ORDER — GABAPENTIN 300 MG PO CAPS: 300.0000 mg | ORAL_CAPSULE | Freq: Three times a day (TID) | ORAL | Status: DC

## 2012-09-15 ENCOUNTER — Observation Stay (HOSPITAL_COMMUNITY)
Admission: EM | Admit: 2012-09-15 | Discharge: 2012-09-16 | Disposition: A | Discharge status: Home / Self Care | Payer: Medicare PPO | Attending: Cardiovascular Disease | Admitting: Cardiovascular Disease

## 2012-09-15 ENCOUNTER — Encounter (HOSPITAL_COMMUNITY): Payer: Self-pay

## 2012-09-15 ENCOUNTER — Emergency Department (HOSPITAL_COMMUNITY): Payer: Medicare PPO

## 2012-09-15 DIAGNOSIS — Z23 Encounter for immunization: Secondary | ICD-10-CM | POA: Insufficient documentation

## 2012-09-15 DIAGNOSIS — E785 Hyperlipidemia, unspecified: Secondary | ICD-10-CM

## 2012-09-15 DIAGNOSIS — G4733 Obstructive sleep apnea (adult) (pediatric): Secondary | ICD-10-CM

## 2012-09-15 DIAGNOSIS — Z9989 Dependence on other enabling machines and devices: Secondary | ICD-10-CM

## 2012-09-15 DIAGNOSIS — N183 Chronic kidney disease, stage 3 unspecified: Secondary | ICD-10-CM | POA: Insufficient documentation

## 2012-09-15 DIAGNOSIS — T82897A Other specified complication of cardiac prosthetic devices, implants and grafts, initial encounter: Secondary | ICD-10-CM | POA: Insufficient documentation

## 2012-09-15 DIAGNOSIS — Q613 Polycystic kidney, unspecified: Secondary | ICD-10-CM

## 2012-09-15 DIAGNOSIS — R739 Hyperglycemia, unspecified: Secondary | ICD-10-CM

## 2012-09-15 DIAGNOSIS — I200 Unstable angina: Secondary | ICD-10-CM

## 2012-09-15 DIAGNOSIS — I2589 Other forms of chronic ischemic heart disease: Secondary | ICD-10-CM | POA: Insufficient documentation

## 2012-09-15 DIAGNOSIS — G8929 Other chronic pain: Secondary | ICD-10-CM

## 2012-09-15 DIAGNOSIS — Z951 Presence of aortocoronary bypass graft: Secondary | ICD-10-CM | POA: Insufficient documentation

## 2012-09-15 DIAGNOSIS — R079 Chest pain, unspecified: Secondary | ICD-10-CM

## 2012-09-15 DIAGNOSIS — I129 Hypertensive chronic kidney disease with stage 1 through stage 4 chronic kidney disease, or unspecified chronic kidney disease: Secondary | ICD-10-CM | POA: Insufficient documentation

## 2012-09-15 DIAGNOSIS — N179 Acute kidney failure, unspecified: Secondary | ICD-10-CM

## 2012-09-15 DIAGNOSIS — I2510 Atherosclerotic heart disease of native coronary artery without angina pectoris: Principal | ICD-10-CM | POA: Insufficient documentation

## 2012-09-15 DIAGNOSIS — E1169 Type 2 diabetes mellitus with other specified complication: Secondary | ICD-10-CM | POA: Diagnosis present

## 2012-09-15 DIAGNOSIS — Y831 Surgical operation with implant of artificial internal device as the cause of abnormal reaction of the patient, or of later complication, without mention of misadventure at the time of the procedure: Secondary | ICD-10-CM | POA: Insufficient documentation

## 2012-09-15 DIAGNOSIS — E119 Type 2 diabetes mellitus without complications: Secondary | ICD-10-CM | POA: Insufficient documentation

## 2012-09-15 DIAGNOSIS — I209 Angina pectoris, unspecified: Secondary | ICD-10-CM | POA: Insufficient documentation

## 2012-09-15 DIAGNOSIS — I10 Essential (primary) hypertension: Secondary | ICD-10-CM | POA: Diagnosis present

## 2012-09-15 DIAGNOSIS — Q245 Malformation of coronary vessels: Secondary | ICD-10-CM | POA: Insufficient documentation

## 2012-09-15 DIAGNOSIS — E1159 Type 2 diabetes mellitus with other circulatory complications: Secondary | ICD-10-CM | POA: Diagnosis present

## 2012-09-15 HISTORY — DX: Dependence on other enabling machines and devices: Z99.89

## 2012-09-15 HISTORY — DX: Obstructive sleep apnea (adult) (pediatric): G47.33

## 2012-09-15 HISTORY — DX: Gastro-esophageal reflux disease without esophagitis: K21.9

## 2012-09-15 LAB — COMPREHENSIVE METABOLIC PANEL
ALT: 10 U/L (ref 0–53)
Alkaline Phosphatase: 124 U/L — ABNORMAL HIGH (ref 39–117)
BUN: 23 mg/dL (ref 6–23)
CO2: 19 mEq/L (ref 19–32)
Chloride: 102 mEq/L (ref 96–112)
GFR calc Af Amer: 53 mL/min — ABNORMAL LOW (ref >90)
Glucose, Bld: 410 mg/dL — ABNORMAL HIGH (ref 70–99)
Potassium: 4.3 mEq/L (ref 3.5–5.1)
Sodium: 136 mEq/L (ref 135–145)
Total Bilirubin: 0.2 mg/dL — ABNORMAL LOW (ref 0.3–1.2)
Total Protein: 7.3 g/dL (ref 6.0–8.3)

## 2012-09-15 LAB — CBC WITH DIFFERENTIAL/PLATELET
Basophils Absolute: 0 K/uL (ref 0.0–0.1)
Basophils Relative: 1 % (ref 0–1)
Eosinophils Absolute: 0.1 10*3/uL (ref 0.0–0.7)
Eosinophils Relative: 1 % (ref 0–5)
HCT: 38.8 % — ABNORMAL LOW (ref 39.0–52.0)
Hemoglobin: 13.1 g/dL (ref 13.0–17.0)
Lymphocytes Relative: 21 % (ref 12–46)
Lymphs Abs: 1.4 10*3/uL (ref 0.7–4.0)
MCH: 28.4 pg (ref 26.0–34.0)
MCHC: 33.8 g/dL (ref 30.0–36.0)
MCV: 84 fL (ref 78.0–100.0)
Monocytes Absolute: 0.8 K/uL (ref 0.1–1.0)
Monocytes Relative: 12 % (ref 3–12)
Neutro Abs: 4.3 10*3/uL (ref 1.7–7.7)
Neutrophils Relative %: 65 % (ref 43–77)
Platelets: 213 10*3/uL (ref 150–400)
RBC: 4.62 MIL/uL (ref 4.22–5.81)
RDW: 12.9 % (ref 11.5–15.5)
WBC: 6.6 10*3/uL (ref 4.0–10.5)

## 2012-09-15 LAB — TROPONIN I
Troponin I: <0.3 ng/mL (ref <0.30)
Troponin I: <0.3 ng/mL (ref <0.30)
Troponin I: <0.3 ng/mL (ref <0.30)

## 2012-09-15 LAB — COMPREHENSIVE METABOLIC PANEL WITH GFR
AST: 13 U/L (ref 0–37)
Albumin: 3.8 g/dL (ref 3.5–5.2)
Calcium: 9.3 mg/dL (ref 8.4–10.5)
Creatinine, Ser: 1.66 mg/dL — ABNORMAL HIGH (ref 0.50–1.35)
GFR calc non Af Amer: 46 mL/min — ABNORMAL LOW (ref >90)

## 2012-09-15 LAB — HEPARIN LEVEL (UNFRACTIONATED): Heparin Unfractionated: 0.43 IU/mL (ref 0.30–0.70)

## 2012-09-15 LAB — TSH: TSH: 2.801 u[IU]/mL (ref 0.350–4.500)

## 2012-09-15 LAB — GLUCOSE, CAPILLARY: Glucose-Capillary: 333 mg/dL — ABNORMAL HIGH (ref 70–99)

## 2012-09-15 LAB — APTT: aPTT: 29 s (ref 24–37)

## 2012-09-15 LAB — PROTIME-INR
INR: 0.85 (ref 0.00–1.49)
Prothrombin Time: 11.6 s (ref 11.6–15.2)

## 2012-09-15 MED ORDER — NITROGLYCERIN 2 % TD OINT
1.0000 [in_us] | TOPICAL_OINTMENT | Freq: Once | TRANSDERMAL | Status: AC
  Administered 2012-09-15: 1 [in_us] via TOPICAL
  Filled 2012-09-15: qty 1

## 2012-09-15 MED ORDER — RANOLAZINE ER 500 MG PO TB12
500.0000 mg | ORAL_TABLET | Freq: Every day | ORAL | Status: DC
  Filled 2012-09-15 (×3): qty 1

## 2012-09-15 MED ORDER — MORPHINE SULFATE 2 MG/ML IJ SOLN
2.0000 mg | Freq: Once | INTRAMUSCULAR | Status: AC
  Administered 2012-09-15: 2 mg via INTRAVENOUS
  Filled 2012-09-15: qty 1

## 2012-09-15 MED ORDER — SODIUM CHLORIDE 0.9 % IV SOLN: 250.0000 mL | INTRAVENOUS | Status: DC | PRN

## 2012-09-15 MED ORDER — DIAZEPAM 5 MG PO TABS
5.0000 mg | ORAL_TABLET | ORAL | Status: AC
  Administered 2012-09-16: 5 mg via ORAL
  Filled 2012-09-15: qty 1

## 2012-09-15 MED ORDER — HEPARIN BOLUS VIA INFUSION
4000.0000 [IU] | Freq: Once | INTRAVENOUS | Status: AC
  Administered 2012-09-15: 4000 [IU] via INTRAVENOUS

## 2012-09-15 MED ORDER — SODIUM CHLORIDE 0.9 % IJ SOLN: 3.0000 mL | Freq: Two times a day (BID) | INTRAMUSCULAR | Status: DC

## 2012-09-15 MED ORDER — MORPHINE SULFATE 4 MG/ML IJ SOLN
4.0000 mg | Freq: Once | INTRAMUSCULAR | Status: AC
  Administered 2012-09-15: 4 mg via INTRAVENOUS
  Filled 2012-09-15: qty 1

## 2012-09-15 MED ORDER — CARVEDILOL 25 MG PO TABS
25.0000 mg | ORAL_TABLET | Freq: Two times a day (BID) | ORAL | Status: DC
  Administered 2012-09-15 – 2012-09-16 (×2): 25 mg via ORAL
  Filled 2012-09-15 (×6): qty 1

## 2012-09-15 MED ORDER — ZOLPIDEM TARTRATE 5 MG PO TABS
10.0000 mg | ORAL_TABLET | Freq: Every evening | ORAL | Status: DC | PRN
  Administered 2012-09-15: 10 mg via ORAL
  Filled 2012-09-15: qty 2

## 2012-09-15 MED ORDER — INSULIN ASPART 100 UNIT/ML ~~LOC~~ SOLN
0.0000 [IU] | Freq: Three times a day (TID) | SUBCUTANEOUS | Status: DC
  Administered 2012-09-15: 20 [IU] via SUBCUTANEOUS
  Administered 2012-09-16: 11 [IU] via SUBCUTANEOUS
  Administered 2012-09-16: 13:00:00 15 [IU] via SUBCUTANEOUS

## 2012-09-15 MED ORDER — GABAPENTIN 300 MG PO CAPS
300.0000 mg | ORAL_CAPSULE | Freq: Three times a day (TID) | ORAL | Status: DC
  Administered 2012-09-15 – 2012-09-16 (×2): 300 mg via ORAL
  Filled 2012-09-15 (×6): qty 1

## 2012-09-15 MED ORDER — SODIUM CHLORIDE 0.9 % IJ SOLN: 3.0000 mL | INTRAMUSCULAR | Status: DC | PRN

## 2012-09-15 MED ORDER — ONDANSETRON HCL 4 MG/2ML IJ SOLN: 4.0000 mg | Freq: Four times a day (QID) | INTRAMUSCULAR | Status: DC | PRN

## 2012-09-15 MED ORDER — ASPIRIN 325 MG PO TABS
325.0000 mg | ORAL_TABLET | Freq: Every day | ORAL | Status: DC
  Filled 2012-09-15 (×2): qty 1

## 2012-09-15 MED ORDER — AMLODIPINE BESYLATE 10 MG PO TABS
10.0000 mg | ORAL_TABLET | Freq: Every day | ORAL | Status: DC
  Administered 2012-09-15 – 2012-09-16 (×2): 10 mg via ORAL
  Filled 2012-09-15 (×3): qty 1

## 2012-09-15 MED ORDER — RANOLAZINE ER 1000 MG PO TB12: 250.0000 mg | ORAL_TABLET | Freq: Two times a day (BID) | ORAL | Status: DC

## 2012-09-15 MED ORDER — NITROGLYCERIN 0.4 MG SL SUBL: 0.4000 mg | SUBLINGUAL_TABLET | SUBLINGUAL | Status: DC | PRN

## 2012-09-15 MED ORDER — CARVEDILOL 25 MG PO TABS
37.5000 mg | ORAL_TABLET | Freq: Two times a day (BID) | ORAL | Status: DC
  Filled 2012-09-15 (×2): qty 1

## 2012-09-15 MED ORDER — INFLUENZA VIRUS VACC SPLIT PF IM SUSP
0.5000 mL | INTRAMUSCULAR | Status: AC
  Administered 2012-09-16: 0.5 mL via INTRAMUSCULAR
  Filled 2012-09-15 (×2): qty 0.5

## 2012-09-15 MED ORDER — INSULIN ASPART 100 UNIT/ML ~~LOC~~ SOLN
8.0000 [IU] | Freq: Once | SUBCUTANEOUS | Status: AC
  Administered 2012-09-15: 8 [IU] via INTRAVENOUS
  Filled 2012-09-15: qty 1

## 2012-09-15 MED ORDER — HEPARIN (PORCINE) IN NACL 100-0.45 UNIT/ML-% IJ SOLN
1300.0000 [IU]/h | INTRAMUSCULAR | Status: DC
  Administered 2012-09-15: 1300 [IU]/h via INTRAVENOUS
  Filled 2012-09-15 (×3): qty 250

## 2012-09-15 MED ORDER — SODIUM CHLORIDE 0.9 % IV SOLN
INTRAVENOUS | Status: DC
  Administered 2012-09-15: 20:00:00 via INTRAVENOUS

## 2012-09-15 MED ORDER — SIMVASTATIN 20 MG PO TABS
20.0000 mg | ORAL_TABLET | Freq: Every day | ORAL | Status: DC
  Administered 2012-09-15: 20 mg via ORAL
  Filled 2012-09-15 (×2): qty 1

## 2012-09-15 MED ORDER — HYDROCODONE-ACETAMINOPHEN 5-325 MG PO TABS
1.0000 | ORAL_TABLET | Freq: Three times a day (TID) | ORAL | Status: DC | PRN
  Administered 2012-09-15: 1 via ORAL
  Filled 2012-09-15: qty 1

## 2012-09-15 MED ORDER — ALPRAZOLAM 0.25 MG PO TABS: 0.2500 mg | ORAL_TABLET | Freq: Two times a day (BID) | ORAL | Status: DC | PRN

## 2012-09-15 MED ORDER — ACETAMINOPHEN 325 MG PO TABS
650.0000 mg | ORAL_TABLET | ORAL | Status: DC | PRN
  Administered 2012-09-15: 650 mg via ORAL
  Filled 2012-09-15: qty 2

## 2012-09-15 MED ORDER — ISOSORBIDE MONONITRATE ER 60 MG PO TB24
120.0000 mg | ORAL_TABLET | Freq: Every day | ORAL | Status: DC
  Administered 2012-09-15 – 2012-09-16 (×2): 120 mg via ORAL
  Filled 2012-09-15 (×3): qty 2

## 2012-09-15 MED ORDER — PANTOPRAZOLE SODIUM 40 MG PO TBEC
40.0000 mg | DELAYED_RELEASE_TABLET | Freq: Two times a day (BID) | ORAL | Status: DC
  Administered 2012-09-15 – 2012-09-16 (×3): 40 mg via ORAL
  Filled 2012-09-15 (×3): qty 1

## 2012-09-15 NOTE — Progress Notes (Signed)
ANTICOAGULATION CONSULT NOTE - Follow Up Consult  Pharmacy Consult for heparin Indication: chest pain/ACS  Allergies  Allergen Reactions  . Ciprocin-Fluocin-Procin (Fluocinolone Acetonide) Rash  . Clarithromycin Itching and Other (See Comments)    "Biaxin" Eyes itch and burn  . Glimepiride (Amaryl) Other (See Comments)    Elevates liver function     Patient Measurements: Height: 5' 5.5" (166.4 cm) Weight: 250 lb (113.399 kg) IBW/kg (Calculated) : 62.65  Heparin Dosing Weight: 90kg  Vital Signs: Temp: 98 F (36.7 C) (01/06 1500) Temp src: Oral (01/06 1500) BP: 139/95 mmHg (01/06 1750) Pulse Rate: 87  (01/06 1500)  Labs:  Basename 09/15/12 2029 09/15/12 1939 09/15/12 1550 09/15/12 1009 09/15/12 0755  HGB -- -- -- -- 13.1  HCT -- -- -- -- 38.8*  PLT -- -- -- -- 213  APTT -- -- -- 29 --  LABPROT -- -- -- 11.6 --  INR -- -- -- 0.85 --  HEPARINUNFRC -- 0.43 -- -- --  CREATININE -- -- -- -- 1.66*  CKTOTAL -- -- -- -- --  CKMB -- -- -- -- --  TROPONINI <0.30 -- <0.30 -- <0.30    Estimated Creatinine Clearance: 61.1 ml/min (by C-G formula based on Cr of 1.66).   Medications:  Scheduled:    . amLODipine  10 mg Oral Daily  . aspirin  325 mg Oral Daily  . carvedilol  25 mg Oral BID WC  . diazepam  5 mg Oral On Call  . gabapentin  300 mg Oral TID  . [COMPLETED] heparin  4,000 Units Intravenous Once  . influenza  inactive virus vaccine  0.5 mL Intramuscular Tomorrow-1000  . insulin aspart  0-20 Units Subcutaneous TID WC  . [COMPLETED] insulin aspart  8 Units Intravenous Once  . isosorbide mononitrate  120 mg Oral Daily  . [COMPLETED]  morphine injection  2 mg Intravenous Once  . [COMPLETED]  morphine injection  4 mg Intravenous Once  . [COMPLETED] nitroGLYCERIN  1 inch Topical Once  . pantoprazole  40 mg Oral BID WC  . ranolazine  500 mg Oral Daily  . simvastatin  20 mg Oral q1800  . sodium chloride  3 mL Intravenous Q12H  . sodium chloride  3 mL Intravenous Q12H    . [DISCONTINUED] carvedilol  37.5 mg Oral BID WC  . [DISCONTINUED] ranolazine  1,000 mg Oral BID    Assessment: 52 yr old man being admitted for chest pain and on heparin. Initial heparin level is at goal (HL= 0.43)  Goal of Therapy:  Heparin level 0.3-0.7 units/ml Monitor platelets by anticoagulation protocol: Yes   Plan:  -No heparin changes needed -Will recheck a heparin level in am  Hildred Laser, Pharm D 09/15/2012 9:19 PM

## 2012-09-15 NOTE — H&P (Signed)
Cardiology H&P   Patient ID: Matthew FLORIAN Sr. MRN: [id number], DOB/AGE: 53-04-1963   Admit date: 09/15/2012 Date of Consult: 09/15/2012  Primary Physician: Lamar Blinks, MD Primary Cardiologist: [name], MD  HPI: Matthew Rowe is a 52yo AA male with PMHx s/f CAD (s/p CABG 2006, PCI, see angiographic data below), known chronic stable angina, ischemic CM, CKD (stage III, baseline Cr 1.6-1.8), polycystic KD, DM2, HTN, HLD, OSA (on CPAP) and obesity who presents to the [facility] ED c/o chest pain.   He last followed up with Dr. Aundra Dubin in 06/2012. He has a history of CAD s/p CABG (RIMA-RCA, anomolous RCA off left cusp), occlusion of the distal LAD. He has known chronic stable angina. He was maximized on Imdur and Norvasc. He takes Ranexa 250 mg either qday or BID- higher doses not tolerated due to sedation.  Lexiscan Myoview in 05/2012 revealed apical and inferior scar with mild peri-infarct ischemia, similar to prior studies. EF 47% with apical HK. He endorsed CCS class I-II angina at that time. Myoview did not indicated new disease, so cath was deferred d/t CKD. Coreg was increased. EECP was suspected as a good option pending if this could be found locally.   He was in his USOH until 6 AM this morning when crushing substernal chest pressure radiating to his left arm rated at a "11/10" awoke him from sleep with associated diaphoresis. This was reminiscent of his stable angina, only more severe. He took low-dose ASA x 3 and Ranexa without relief (out of NTG tablets). No PND, orthopnea or LE edema, unilateral leg tenderness or redness. No aggravation with meals or position changes. No n/v/d, sick contacts or active bleeding. The pain persisted, and he called EMS. He was given NTG with relief. No associated SOB, nausea, lightheadedness.   In the ED, EKG revealed no acute ischemia. Initial TnI WNL. CXR w/o acute abnormalities. BMET revealed Cr of 1.66. Glu 410. NTG patch was applied with relief to  2/10. No chest pain currently.  Problem List: Past Medical History  Diagnosis Date  . Chest pain     Chronic  . CKD (chronic kidney disease)   . DM2 (diabetes mellitus, type 2)   . Dyslipidemia   . Coronary artery disease     a. s/p INF MI 1999=>PCI of RCA;  b.  prior extensive stenting of LAD with subsequent stent occlusion distally;  c.  s/p CABG with RIMA to RCA in 2006;  d.  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 Acute MI, inferior wall 1999  . Hx of CABG     2006 in [place]  . Abnormal stress test     a. 11/11:  Myoview: apical infarct with ? small focus of inducible ischemia dInf wall, and Inf Apex, EF 46%;  b. Myoview 9/13:  mild apical scar with mild reversibility and mod inf scar with mod reverisibility, EF 47% => stable and med Rx cont'd   . Hypertension   . Polycystic kidney disease   . Hx of echocardiogram     a. Echo 3/12:  mild LVH, EF 55-60%, trivial AI    Past Surgical History  Procedure Date  . Cystectomy     off face  . Cardiac catheterization 1999 / 2002 / 2004 / 2004    with stent / at least  4 surgeries  . Coronary artery bypass graft 2006     Allergies:  Allergies  Allergen Reactions  . Glimepiride (Amaryl)     Elevates liver function   . Ciprocin-Fluocin-Procin (Fluocinolone Acetonide) Rash  . Clarithromycin Other (See Comments)    Eyes itch and burn    Home Medications: Prior to Admission medications   Medication Sig Start Date End Date Taking? Authorizing Provider  acetaminophen (TYLENOL) 500 MG tablet Take 1,000 mg by mouth 2 (two) times daily as needed. For pain   Yes Historical Provider, MD  amLODipine (NORVASC) 10 MG tablet Take 1 tablet (10 mg total) by mouth daily. 10/31/11  Yes Gay Filler Copland, MD  aspirin 325 MG tablet Take 325 mg by mouth daily.    Yes Historical Provider, MD  carvedilol  (COREG) 25 MG tablet Take 37.5 mg by mouth 2 (two) times daily with a meal. 1 and 1/2 tablets (total 37.5mg ) two times a day 06/27/12  Yes Larey Dresser, MD  gabapentin (NEURONTIN) 300 MG capsule Take 1 capsule (300 mg total) by mouth 3 (three) times daily. 09/12/12  Yes Peter M Martinique, MD  hydrochlorothiazide (HYDRODIURIL) 25 MG tablet Take 1 tablet (25 mg total) by mouth daily as needed. For blood pressure 03/27/12 04/27/13 Yes Jessica C Copland, MD  HYDROcodone-acetaminophen (VICODIN) 5-500 MG per tablet Take 1 tablet by mouth every 8 (eight) hours as needed. 08/06/12  Yes Gay Filler Copland, MD  isosorbide mononitrate (IMDUR) 60 MG 24 hr tablet Take 1 tablet (60 mg total) by mouth 2 (two) times daily. 05/29/12 05/29/13 Yes Liliane Shi, PA  losartan (COZAAR) 100 MG tablet Take 0.5 tablets (50 mg total) by mouth daily. 10/31/11  Yes Gay Filler Copland, MD  metFORMIN (GLUCOPHAGE) 500 MG tablet Take 500 mg by mouth 2 (two) times daily with a meal.  08/10/12  Yes Mancel Bale, PA-C  nitroGLYCERIN (NITROSTAT) 0.4 MG SL tablet Place 1 tablet (0.4 mg total) under the tongue every 5 (five) minutes as needed. 08/29/11  Yes Peter M Martinique, MD  pantoprazole (PROTONIX) 40 MG tablet Take 40 mg by mouth 2 (two) times daily with a meal. 05/29/12 05/29/13 Yes Liliane Shi, PA  pravastatin (PRAVACHOL) 40 MG tablet Take 1 tablet (40 mg total) by mouth every evening. 06/07/12  Yes Ryan M Dunn, PA-C  ranolazine (RANEXA) 500 MG 12 hr tablet Take one half tablet (250 mg) twice a day 05/29/12  Yes Liliane Shi, PA  vitamin E 1000 UNIT capsule Take 1,000 Units by mouth 2 (two) times daily.     Yes Historical Provider, MD  zolpidem (AMBIEN) 10 MG tablet Take 1 tablet (10 mg total) by mouth at bedtime as needed. For insomnia 08/06/12  Yes Darreld Mclean, MD    Inpatient Medications:      (Not in a hospital admission)  Family History  Problem Relation Age of Onset  . Cancer Mother   . Diabetes Father   . Heart  disease Father   . Anemia Mother   . Heart attack Father   . Heart failure Father   . Hyperlipidemia Father   . Hypertension Father   . Kidney disease Mother   . Sudden death Father   . Kidney failure Sister 5  . Heart attack Sister   . Kidney disease Sister      History   Social History  . Marital Status: Married    Spouse Name: N/A    Number of Children: N/A  .  Years of Education: N/A   Occupational History  . Not on file.   Social History Main Topics  . Smoking status: Never Smoker   . Smokeless tobacco: Never Used     Comment: smoked some as a teenager (high school)  . Alcohol Use: No  . Drug Use: No  . Sexually Active: Yes   Other Topics Concern  . Not on file   Social History Narrative  . No narrative on file     Review of Systems: General: positive for diaphoresis, negative for chills, fever, night sweats or weight changes.  Cardiovascular: positive for chest pain, negative for dyspnea on exertion, edema, orthopnea, palpitations, paroxysmal nocturnal dyspnea or shortness of breath Dermatological: negative for rash Respiratory: negative for cough or wheezing Urologic: negative for hematuria Abdominal: negative for nausea, vomiting, diarrhea, bright red blood per rectum, melena, or hematemesis Neurologic: negative for visual changes, syncope, or dizziness All other systems reviewed and are otherwise negative except as noted above.  Physical Exam: Blood pressure 115/69, pulse 89, temperature 98.4 F (36.9 C), temperature source Oral, resp. rate 21, SpO2 95.00%.    General: Obese, well developed, well nourished, in no acute distress. Head: Normocephalic, atraumatic, sclera non-icteric, no xanthomas, nares are without discharge.  Neck: Negative for carotid bruits. JVD not elevated. Lungs: Clear bilaterally to auscultation without wheezes, rales, or rhonchi. Breathing is unlabored. Heart: Regular rate, intermittently tachycardic, regular rhythm, with S1 S2.  No murmurs, rubs, or gallops appreciated. Abdomen: Soft, non-tender, non-distended with normoactive bowel sounds. No hepatomegaly. No rebound/guarding. No obvious abdominal masses. Msk:  Strength and tone appears normal for age. Extremities: No clubbing, cyanosis or edema.  Distal pedal pulses are 2+ and equal bilaterally. No appreciable femoral bruits. Neuro: Alert and oriented X 3. Moves all extremities spontaneously. Psych:  Responds to questions appropriately with a normal affect.  Labs: Recent Labs  [facility] 09/15/12 0755   WBC 6.6   HGB 13.1   HCT 38.8*   MCV 84.0   PLT 213   Lab 09/15/12 0755  NA 136  K 4.3  CL 102  CO2 19  BUN 23  CREATININE 1.66*  CALCIUM 9.3  PROT 7.3  BILITOT 0.2*  ALKPHOS 124*  ALT 10  AST 13  AMYLASE --  LIPASE --  GLUCOSE 410*   Recent Labs  Basename 09/15/12 0755   CKTOTAL --   CKMB --   CKMBINDEX --   TROPONINI <0.30   Radiology/Studies: Dg Chest Port 1 View  09/15/2012  *RADIOLOGY REPORT*  Clinical Data: Chest pain.  PORTABLE CHEST - 1 VIEW  Comparison: November 10, 2011.  Findings: Sternotomy wires are noted.  Otherwise cardiomediastinal silhouette appears normal.  No acute pulmonary disease is noted. Bony thorax is intact.  IMPRESSION: No acute cardiopulmonary abnormality seen.   Original Report Authenticated By: Marijo Conception.,  M.D.    EKG: NSR, 100 bpm, Qs II, III, aVF with IVCD, hyperacute P waves, anterior Qs V3-V4, no ST/T changes  ASSESSMENT:   52yo AA male with CAD (s/p CABG & prior PCI), known chronic stable angina, ischemic CM (EF 47%), CKD (stage III, baseline Cr 1.6-1.8), polycystic KD, DM2, HTN, HLD, OSA (on CPAP) and obesity who presents to [facility] ED with chest pain.   1. CAD/unstable angina pectoris 2. Ischemic cardiomyopathy 3. CKD, stage III  -- polycystic kidney disease 4. Type 2 DM 5. HTN 6. HLD 7. OSA on CPAP  DISCUSSION/PLAN:  The patient has a significant cardiac history and chronic  stable angina.  This had been at baseline since following up with Dr. Aundra Dubin in 06/2012. Anti-anginals in Norvasc and Imdur have been maximized. Ranexa limited to at most 500 mg in a day due to sedation. Coreg was up-titrated to 37.5mg  BID. Had been doing well until he was awoken this morning with severe substernal crushing chest discomfort reminiscent of his chronic angina, only more severe and radiating to his left arm, which was responsive to NTG. EKG in the ED reveals prior inferior and anterior infarcts, but no evidence of ischemia. Initial trop-I WNL. He is currently chest pain free and in NAD on NTG patch. Cath had been deferred on follow-up with Dr. Aundra Dubin due to lack of concerning findings on 05/2012 Myoview to suggest CAD progression, and also to avoid contrast-induced nephrotoxicity with the patient's CKD. Symptoms today are concerning for unstable angina. On speaking with the patient, he wishes to defer an elective cath (if he rules out) until his family (from [place]) can arrive for the procedure, however pursuing cath this admission giving rest angina would certainly be indicated regardless if he rules in or not. Would not pursue repeat stress testing given recent Myoview in 05/2012 - peri-infarct ischemic noted at that time. Observe overnight, trend biomarkers, obtain serial EKGs, daily BMET, hold ARB, HCTZ, metformin and gabapentin, continue other OP meds, start SSI for glycemic control, gently hydrate overnight and continue PPI. Heparinize. Continue CPAP.     Signed, R. Valeria Batman, PA-C 09/15/2012, 11:14 AM  Patient seen and examined  Agree with findings as noted above by R. Arguello. Patient is a 52 yo with known CAD and chronic stable angina until today.  Today, woke up with severe SSCP that radiated to arm.  Did not have NTG to take In ER with NTG discomfort resolved.  Patient is currently pain free  On exam :  Neck  JVP is normal  Lungs:  CTA  Cardiac :  RRR.  No S3   Ext:  No edema.  Plan:   Admit.  R/O MI  Given presentation at rest would recomm L heart cath *(with minimal contrast) Continue meds  Resume Coreg at 25 bid  (he ran out on Saturday)  2.  Renal  Follow  3.  HTN  Follow  4.  HTN  FOllow.

## 2012-09-15 NOTE — ED Notes (Signed)
Per [facility], pt woke up this morning with mid cp radiating down his left arm. Took 3-325 mg ASA prior to EMS arrival. EMS gave 4 NTG and pain went from 10/10 to 2/10. ST on the monitor. Pt states the pain felt like his last MI but also when his sugar is up it felt the same as that. 20g LH.

## 2012-09-15 NOTE — ED Notes (Signed)
Dr. Dorna Mai at the bedside.

## 2012-09-15 NOTE — Progress Notes (Addendum)
Patient's Cbg= 333 just prior to transfer from ED to 3000 at 1430.  I called to Valeria Batman, PA he stated to just recheck cbg at dinner time, no additional coverage at this time.

## 2012-09-15 NOTE — Progress Notes (Signed)
ANTICOAGULATION CONSULT NOTE - Initial Consult  Pharmacy Consult for Heparin Indication: chest pain/ACS  Allergies  Allergen Reactions  . Glimepiride (Amaryl)     Elevates liver function   . Ciprocin-Fluocin-Procin (Fluocinolone Acetonide) Rash  . Clarithromycin Other (See Comments)    Eyes itch and burn    Patient Measurements: Height: 5' 5.5" (166.4 cm) Weight: 250 lb (113.399 kg) IBW/kg (Calculated) : 62.65  Heparin Dosing Weight: 90 kg  Vital Signs: Temp: 98.4 F (36.9 C) (01/06 0721) Temp src: Oral (01/06 0721) BP: 134/95 mmHg (01/06 1330) Pulse Rate: 95  (01/06 1330)  Labs:  Basename 09/15/12 1009 09/15/12 0755  HGB -- 13.1  HCT -- 38.8*  PLT -- 213  APTT 29 --  LABPROT 11.6 --  INR 0.85 --  HEPARINUNFRC -- --  CREATININE -- 1.66*  CKTOTAL -- --  CKMB -- --  TROPONINI -- <0.30    Estimated Creatinine Clearance: 61.1 ml/min (by C-G formula based on Cr of 1.66).   Medical History: Past Medical History  Diagnosis Date  . Chest pain     Chronic  . CKD (chronic kidney disease)   . DM2 (diabetes mellitus, type 2)   . Dyslipidemia   . Coronary artery disease     a. s/p INF MI 1999=>PCI of RCA;  b.  prior extensive stenting of LAD with subsequent stent occlusion distally;  c.  s/p CABG with RIMA to RCA in 2006;  d.  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 Acute MI, inferior wall 1999  . Hx of CABG     2006 in [place]  . Abnormal stress test     a. 11/11:  Myoview: apical infarct with ? small focus of inducible ischemia dInf wall, and Inf Apex, EF 46%;  b. Myoview 9/13:  mild apical scar with mild reversibility and mod inf scar with mod reverisibility, EF 47% => stable and med Rx cont'd   . Hypertension   . Polycystic kidney disease   . Hx of echocardiogram     a. Echo 3/12:  mild LVH, EF 55-60%, trivial AI     Assessment:  52 yr old man being admitted for chest pain. Known CAD and chronic stable angina, prior CABG.  Initial troponin negative, but symptoms concerning for unstable angina. Last myoview 05/2012.  For possible cardiac cath on 1/7.  Goal of Therapy:  Heparin level 0.3-0.7 units/ml Monitor platelets by anticoagulation protocol: Yes   Plan:   Heparin 4000 units IV bolus, then infusion at 1300 units/hr.  Heparin level ~6 hrs after drip begins.  Daily heparin level and CBC while on heparin.  Arty Baumgartner, [place] Pager: [phone] 09/15/2012,1:44 PM

## 2012-09-15 NOTE — ED Notes (Signed)
Portable chest xray being completed.  

## 2012-09-15 NOTE — ED Provider Notes (Signed)
History     CSN: [id number]  Arrival date & time 09/15/12  [id number]   First MD Initiated Contact with Patient 09/15/12 0720      Chief Complaint  Patient presents with  . Chest Pain    (Consider location/radiation/quality/duration/timing/severity/associated sxs/prior treatment) HPI Comments: Patient with history of coronary disease including a CABG in 2006 and [facility], Dr. Aundra Dubin. Patient reports that he does have almost daily angina. He also has a history of polycystic kidney disease, hypertension and diabetes. He does admit that he had run out of his nitroglycerin and one of his other medications. He did have most of his blood pressure medicine as well as his Neurontin and his Vicodin that he takes on occasion for chest pain. He reports he did eat a heavy dinner but was early at around 6 PM. He does have heartburn at times but the chest pain that bother him this morning felt nothing like heartburn. He reports he was woken up with the chest pain no is tremendous, 10 out of 10 in his diffuse anterior chest wall area. He reports it radiated to his left shoulder and down his left arm which was unusual and different from his usual anginal symptoms which worried him. He did take aspirin. He called EMS who evaluated him and brought him here for further evaluation in the emergency department. Patient reports his left hand became a little clammy but overall no diaphoresis and denied any significant shortness of breath and no nausea or vomiting. Patient did take a Vicodin and he reports did not seem to improve his pain at all. EMS provided 3 sublingual nitroglycerin which brought his pain down currently to a 2/10. He denies any URI symptoms. He has not had his influenza vaccination.  Patient is a 52 y.o. male presenting with chest pain. The history is provided by the patient and medical records.  Chest Pain Pertinent negatives for primary symptoms include no fever, no  shortness of breath, no cough, no abdominal pain, no nausea, no vomiting and no dizziness.  Pertinent negatives for associated symptoms include no diaphoresis.     Past Medical History  Diagnosis Date  . Chest pain     Chronic  . CKD (chronic kidney disease)   . DM2 (diabetes mellitus, type 2)   . Dyslipidemia   . Coronary artery disease     a. s/p INF MI 1999=>PCI of RCA;  b.  prior extensive stenting of LAD with subsequent stent occlusion distally;  c.  s/p CABG with RIMA to RCA in 2006;  d.  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 Acute MI, inferior wall 1999  . Hx of CABG     2006 in [place]  . Abnormal stress test     a. 11/11:  Myoview: apical infarct with ? small focus of inducible ischemia dInf wall, and Inf Apex, EF 46%;  b. Myoview 9/13:  mild apical scar with mild reversibility and mod inf scar with mod reverisibility, EF 47% => stable and med Rx cont'd   . Hypertension   . Polycystic kidney disease   . Hx of echocardiogram     a. Echo 3/12:  mild LVH, EF 55-60%, trivial AI    Past Surgical History  Procedure Date  . Cystectomy     off face  .  Cardiac catheterization 1999 / 2002 / 2004 / 2004    with stent / at least 4 surgeries  . Coronary artery bypass graft 2006    Family History  Problem Relation Age of Onset  . Cancer Mother   . Diabetes Father   . Heart disease Father   . Anemia Mother   . Heart attack Father   . Heart failure Father   . Hyperlipidemia Father   . Hypertension Father   . Kidney disease Mother   . Sudden death Father   . Kidney failure Sister 5  . Heart attack Sister   . Kidney disease Sister     History  Substance Use Topics  . Smoking status: Never Smoker   . Smokeless tobacco: Never Used     Comment: smoked some as a teenager (high school)  . Alcohol Use: No      Review of  Systems  Constitutional: Negative for fever, chills and diaphoresis.  HENT: Negative for congestion, rhinorrhea and sinus pressure.   Respiratory: Positive for chest tightness. Negative for cough and shortness of breath.   Cardiovascular: Positive for chest pain.  Gastrointestinal: Negative for nausea, vomiting, abdominal pain, diarrhea and constipation.  Musculoskeletal: Negative for back pain.  Skin: Negative for rash.  Neurological: Negative for dizziness, syncope and light-headedness.  All other systems reviewed and are negative.    Allergies  Glimepiride; Ciprocin-fluocin-procin; and Clarithromycin  Home Medications   Current Outpatient Rx  Name  Route  Sig  Dispense  Refill  . ACETAMINOPHEN 500 MG PO TABS   Oral   Take 1,000 mg by mouth 2 (two) times daily as needed. For pain         . AMLODIPINE BESYLATE 10 MG PO TABS   Oral   Take 1 tablet (10 mg total) by mouth daily.   90 tablet   2   . ASPIRIN 325 MG PO TABS   Oral   Take 325 mg by mouth daily.          Marland Kitchen CARVEDILOL 25 MG PO TABS   Oral   Take 37.5 mg by mouth 2 (two) times daily with a meal. 1 and 1/2 tablets (total 37.5mg ) two times a day         . GABAPENTIN 300 MG PO CAPS   Oral   Take 1 capsule (300 mg total) by mouth 3 (three) times daily.   90 capsule   5   . HYDROCHLOROTHIAZIDE 25 MG PO TABS   Oral   Take 1 tablet (25 mg total) by mouth daily as needed. For blood pressure   90 tablet   3   . HYDROCODONE-ACETAMINOPHEN 5-500 MG PO TABS   Oral   Take 1 tablet by mouth every 8 (eight) hours as needed.   15 tablet   0   . ISOSORBIDE MONONITRATE ER 60 MG PO TB24   Oral   Take 1 tablet (60 mg total) by mouth 2 (two) times daily.   60 tablet   11   . LOSARTAN POTASSIUM 100 MG PO TABS   Oral   Take 0.5 tablets (50 mg total) by mouth daily.   45 tablet   3   . METFORMIN HCL 500 MG PO TABS   Oral   Take 500 mg by mouth 2 (two) times daily with a meal.          . NITROGLYCERIN 0.4  MG SL SUBL   Sublingual   Place 1 tablet (0.4  mg total) under the tongue every 5 (five) minutes as needed.   100 tablet   11   . PANTOPRAZOLE SODIUM 40 MG PO TBEC   Oral   Take 40 mg by mouth 2 (two) times daily with a meal.         . PRAVASTATIN SODIUM 40 MG PO TABS   Oral   Take 1 tablet (40 mg total) by mouth every evening.   30 tablet   1     Needs office visit after last refill   . RANOLAZINE ER 500 MG PO TB12      Take one half tablet (250 mg) twice a day         . VITAMIN E 1000 UNITS PO CAPS   Oral   Take 1,000 Units by mouth 2 (two) times daily.           Marland Kitchen ZOLPIDEM TARTRATE 10 MG PO TABS   Oral   Take 1 tablet (10 mg total) by mouth at bedtime as needed. For insomnia   30 tablet   0     BP 151/81  Pulse 105  Temp 98.4 F (36.9 C) (Oral)  Resp 18  SpO2 97%  Physical Exam  Nursing note and vitals reviewed. Constitutional: He is oriented to person, place, and time. He appears well-developed and well-nourished.  HENT:  Head: Normocephalic and atraumatic.  Eyes: EOM are normal. Pupils are equal, round, and reactive to light.  Neck: Normal range of motion. Neck supple.  Cardiovascular: Normal rate and regular rhythm.   Pulmonary/Chest: Effort normal and breath sounds normal. No respiratory distress. He has no wheezes.  Abdominal: Soft. He exhibits no distension. There is no tenderness.  Neurological: He is alert and oriented to person, place, and time. No cranial nerve deficit. Coordination normal.  Skin: Skin is warm. No rash noted.    ED Course  Procedures (including critical care time)  CRITICAL CARE Performed by: Donzetta Matters.   Total critical care time: 30 min  Critical care time was exclusive of separately billable procedures and treating other patients.  Critical care was necessary to treat or prevent imminent or life-threatening deterioration.  Critical care was time spent personally by me on the following activities: development  of treatment plan with patient and/or surrogate as well as nursing, discussions with consultants, evaluation of patient's response to treatment, examination of patient, obtaining history from patient or surrogate, ordering and performing treatments and interventions, ordering and review of laboratory studies, ordering and review of radiographic studies, pulse oximetry and re-evaluation of patient's condition.   Labs Reviewed  CBC WITH DIFFERENTIAL - Abnormal; Notable for the following:    HCT 38.8 (*)     All other components within normal limits  COMPREHENSIVE METABOLIC PANEL - Abnormal; Notable for the following:    Glucose, Bld 410 (*)     Creatinine, Ser 1.66 (*)     Alkaline Phosphatase 124 (*)     Total Bilirubin 0.2 (*)     GFR calc non Af Amer 46 (*)     GFR calc Af Amer 53 (*)     All other components within normal limits  TROPONIN I  APTT  PROTIME-INR   Dg Chest Port 1 View  09/15/2012  *RADIOLOGY REPORT*  Clinical Data: Chest pain.  PORTABLE CHEST - 1 VIEW  Comparison: November 10, 2011.  Findings: Sternotomy wires are noted.  Otherwise cardiomediastinal silhouette appears normal.  No acute pulmonary disease is noted. Bony thorax is  intact.  IMPRESSION: No acute cardiopulmonary abnormality seen.   Original Report Authenticated By: Marijo Conception.,  M.D.      1. Chest pain   2. Hyperglycemia     Room air saturation is 97% and I interpret this to be normal.  EKG performed at time 07:24 shows normal sinus rhythm at a rate of 100, left axis deviation, left anterior fascicular block. Probably prior inferior infarct. Poor R wave progression is noted. No significant change compared to ECG from 11/11/2011.  10:09 AM Pt reports arm pain is now a 3/10 pain despite NTG paste and morphine.  Pt's BP is 117/75, I don't think NTG drip is needed at this time as MAP is low.  Will give additional morphine and I have spoken to [facility] cardiology to see pt.  Will give some [place] insulin as well for  glucose of >400.  Anion gap is ok, no symptoms of DKA.  MDM   Patient with typical anginal symptoms although more severe than than usual which may have been exacerbated by the fact that he did not have all his medications on hand. In addition the pain did go down his left arm which is different. He denied any significant shortness of breath, sweating or nausea. His EKG looks unchanged from priors. Pain is now down to 2/10. Plan is to put nitro paste on, cycle enzymes and discuss with the [facility] cardiology.        Saddie Benders. Braxen Dobek, MD 09/15/12 1012

## 2012-09-16 ENCOUNTER — Encounter (HOSPITAL_COMMUNITY): Payer: Self-pay | Admitting: Physician Assistant

## 2012-09-16 ENCOUNTER — Encounter (HOSPITAL_COMMUNITY)
Admission: EM | Disposition: A | Discharge status: Home / Self Care | Payer: Self-pay | Source: Home / Self Care | Attending: Emergency Medicine

## 2012-09-16 DIAGNOSIS — I200 Unstable angina: Secondary | ICD-10-CM

## 2012-09-16 DIAGNOSIS — I2510 Atherosclerotic heart disease of native coronary artery without angina pectoris: Secondary | ICD-10-CM

## 2012-09-16 HISTORY — PX: LEFT HEART CATHETERIZATION WITH CORONARY/GRAFT ANGIOGRAM: SHX5450

## 2012-09-16 LAB — BASIC METABOLIC PANEL
BUN: 23 mg/dL (ref 6–23)
Creatinine, Ser: 1.65 mg/dL — ABNORMAL HIGH (ref 0.50–1.35)
GFR calc Af Amer: 54 mL/min — ABNORMAL LOW (ref >90)
GFR calc non Af Amer: 46 mL/min — ABNORMAL LOW (ref >90)
Glucose, Bld: 300 mg/dL — ABNORMAL HIGH (ref 70–99)

## 2012-09-16 LAB — HEMOGLOBIN A1C: Hgb A1c MFr Bld: 11.7 % — ABNORMAL HIGH (ref <5.7)

## 2012-09-16 LAB — LIPID PANEL
Cholesterol: 190 mg/dL (ref 0–200)
HDL: 31 mg/dL — ABNORMAL LOW (ref >39)
Total CHOL/HDL Ratio: 6.1 RATIO
VLDL: UNDETERMINED mg/dL (ref 0–40)

## 2012-09-16 LAB — GLUCOSE, CAPILLARY
Glucose-Capillary: 272 mg/dL — ABNORMAL HIGH (ref 70–99)
Glucose-Capillary: 313 mg/dL — ABNORMAL HIGH (ref 70–99)

## 2012-09-16 LAB — HEPARIN LEVEL (UNFRACTIONATED): Heparin Unfractionated: 0.47 [IU]/mL (ref 0.30–0.70)

## 2012-09-16 LAB — CBC
HCT: 35.5 % — ABNORMAL LOW (ref 39.0–52.0)
MCHC: 34.4 g/dL (ref 30.0–36.0)
MCV: 82.8 fL (ref 78.0–100.0)
RDW: 12.8 % (ref 11.5–15.5)

## 2012-09-16 SURGERY — LEFT HEART CATHETERIZATION WITH CORONARY/GRAFT ANGIOGRAM
Anesthesia: LOCAL

## 2012-09-16 MED ORDER — ACETAMINOPHEN 325 MG PO TABS
650.0000 mg | ORAL_TABLET | ORAL | Status: DC | PRN
  Administered 2012-09-16: 650 mg via ORAL
  Filled 2012-09-16: qty 2

## 2012-09-16 MED ORDER — SODIUM CHLORIDE 0.9 % IV SOLN: 1.0000 mL/kg/h | INTRAVENOUS | Status: DC

## 2012-09-16 MED ORDER — SODIUM CHLORIDE 0.9 % IV SOLN: 250.0000 mL | INTRAVENOUS | Status: DC

## 2012-09-16 MED ORDER — LIDOCAINE HCL (PF) 1 % IJ SOLN
INTRAMUSCULAR | Status: AC
  Filled 2012-09-16: qty 30

## 2012-09-16 MED ORDER — ONDANSETRON HCL 4 MG/2ML IJ SOLN: 4.0000 mg | Freq: Four times a day (QID) | INTRAMUSCULAR | Status: DC | PRN

## 2012-09-16 MED ORDER — INSULIN ASPART 100 UNIT/ML ~~LOC~~ SOLN: 9.0000 [IU] | Freq: Three times a day (TID) | SUBCUTANEOUS | Status: DC

## 2012-09-16 MED ORDER — FENTANYL CITRATE 0.05 MG/ML IJ SOLN
INTRAMUSCULAR | Status: AC
  Filled 2012-09-16: qty 2

## 2012-09-16 MED ORDER — METFORMIN HCL 500 MG PO TABS: 500.0000 mg | ORAL_TABLET | Freq: Two times a day (BID) | ORAL | Status: DC

## 2012-09-16 MED ORDER — INSULIN GLARGINE 100 UNIT/ML ~~LOC~~ SOLN: 28.0000 [IU] | Freq: Every morning | SUBCUTANEOUS | Status: DC

## 2012-09-16 MED ORDER — SODIUM CHLORIDE 0.9 % IJ SOLN: 3.0000 mL | Freq: Two times a day (BID) | INTRAMUSCULAR | Status: DC

## 2012-09-16 MED ORDER — NITROGLYCERIN 0.2 MG/ML ON CALL CATH LAB
INTRAVENOUS | Status: AC
  Filled 2012-09-16: qty 1

## 2012-09-16 MED ORDER — MIDAZOLAM HCL 2 MG/2ML IJ SOLN
INTRAMUSCULAR | Status: AC
  Filled 2012-09-16: qty 2

## 2012-09-16 MED ORDER — INSULIN GLARGINE 100 UNIT/ML ~~LOC~~ SOLN: 28.0000 [IU] | Freq: Every day | SUBCUTANEOUS | Status: DC

## 2012-09-16 MED ORDER — ATORVASTATIN CALCIUM 80 MG PO TABS: 80.0000 mg | ORAL_TABLET | Freq: Every day | ORAL | Status: DC

## 2012-09-16 MED ORDER — SODIUM CHLORIDE 0.9 % IJ SOLN: 3.0000 mL | INTRAMUSCULAR | Status: DC | PRN

## 2012-09-16 MED ORDER — RANOLAZINE ER 500 MG PO TB12: ORAL_TABLET | ORAL | Status: DC

## 2012-09-16 MED ORDER — INSULIN GLARGINE 100 UNIT/ML ~~LOC~~ SOLN
14.0000 [IU] | Freq: Once | SUBCUTANEOUS | Status: AC
  Administered 2012-09-16: 14 [IU] via SUBCUTANEOUS

## 2012-09-16 MED ORDER — VERAPAMIL HCL 2.5 MG/ML IV SOLN
INTRAVENOUS | Status: AC
  Filled 2012-09-16: qty 2

## 2012-09-16 MED ORDER — INSULIN PEN STARTER KIT
1.0000 | Freq: Once | Status: AC
  Administered 2012-09-16: 13:00:00 1
  Filled 2012-09-16: qty 1

## 2012-09-16 MED ORDER — HEPARIN (PORCINE) IN NACL 2-0.9 UNIT/ML-% IJ SOLN
INTRAMUSCULAR | Status: AC
  Filled 2012-09-16: qty 1000

## 2012-09-16 NOTE — Interval H&P Note (Signed)
History and Physical Interval Note:  09/16/2012 8:01 AM  Matthew Kirks Sr.  has presented today for surgery, with the diagnosis of cp  The various methods of treatment have been discussed with the patient and family. After consideration of risks, benefits and other options for treatment, the patient has consented to  Procedure(s) (LRB) with comments: LEFT HEART CATHETERIZATION WITH CORONARY/GRAFT ANGIOGRAM (N/A) as a surgical intervention .  The patient's history has been reviewed, patient examined, no change in status, stable for surgery.  I have reviewed the patient's chart and labs.  Questions were answered to the patient's satisfaction.     [name]

## 2012-09-16 NOTE — Progress Notes (Signed)
Patient with A1c of 11.7% (09/15/12).  MD plans to send patient home on Lantus solostar and Novolog flexpen.  Spoke with patient about his elevated A1c.  Explained what an A1c is and what it measures.  Reminded patient that his goal A1c is 7% or less per ADA standards.  Explained why patient will need to be discharged home on insulin to improve his CBG control given his cardiac history.   Educated patient and spouse on insulin pen use at home.  Reviewed contents of insulin flexpen starter kit.  Reviewed all steps if insulin pen including attachment of needle, 2-unit air shot, dialing up dose, giving injection, removing needle, disposal of sharps, storage of unused insulin, disposal of insulin etc.  Patient able to provide successful verbal return demonstration (patient has used insulin pens before).  Also reviewed troubleshooting with insulin pen.  MD to give patient Rxs for insulin pens and insulin pen needles.  Reviewed signs and symptoms of hypoglycemia and how to treat.  Patient's wife very receptive to all information.  Also reviewed discharge insulin Rxs with patient and his wife.  Will follow. Wyn Quaker RN, MSN, CDE Diabetes Coordinator Inpatient Diabetes Program [phone]

## 2012-09-16 NOTE — Progress Notes (Signed)
Utilization Review Completed.   Abryanna Musolino, RN, BSN Nurse Case Manager  [phone]  

## 2012-09-16 NOTE — CV Procedure (Signed)
   Cardiac Catheterization Procedure Note  Name: Matthew KINTZEL Sr. MRN: [id number] DOB: 08/21/1961  Procedure: Left Heart Cath, Selective Coronary Angiography, RIMA Angiography  Indication: Progressive CCS Class 3-4 angina   Procedural Details: The right wrist was prepped, draped, and anesthetized with 1% lidocaine. Using the modified Seldinger technique, a 5 French sheath was introduced into the right radial artery. 3 mg of verapamil was administered through the sheath, weight-based unfractionated heparin was administered intravenously. Standard Judkins catheters were used for selective coronary angiography and RIMA angiography. 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 It arises from above the left main. It was cannulated with an AL-2 catheter after attempts with several other catheters. Catheter exchanges were performed over an exchange length guidewire. There were no immediate procedural complications. A TR band was used for radial hemostasis at the completion of the procedure.  The patient was transferred to the post catheterization recovery area for further monitoring.  Procedural Findings: Hemodynamics: AO 126/88 LV 128/18  Coronary angiography: Coronary dominance: right  Left mainstem: Calcified, 40-50% distal LM stenosis  Left anterior descending (LAD): Proximal calcification. Patnet proximal stent with mild 30% ISR. Multiple mid-LAD stents with total occlusion within stented portion of the distal vessel. Diffuse 50% ISR prior to the occlusion.  Left circumflex (LCx): Patent, diffusely diseased ramus branch. 70% proximal LCx stenosis supplying 2 small marginal branches.  Right coronary artery (RCA): Diffusely diseased with 50% stenosis throughout the proximal and mid vessel. Competitive filling in the mid-vessel at the attachment of the [place]. Distal vessel with diffuse 50% stenosis. The PDA and PLA branches are patent.  Left ventriculography: Deferred because  of chronic kidney disease.  Final Conclusions:   1. Moderate diffuse 3 vessel CAD as described without significant change from previous study. 2. Continued patency of the RIMA-RCA 3. Anomalous RCA.  95 cc contrast given  Recommendations: Continued medical therapy. No specific targets for revascularization.  [name] 09/16/2012, 9:35 AM

## 2012-09-16 NOTE — Progress Notes (Signed)
Called by RN re: hyperglycemia and patient questions with glycemic control. CBG well-controlled on prandial SSI. He requested a snack last night for which he did receive 8 units of Novolog at 1019, however CBG continues to trend up - 313=>341. Hgb A1C returned at 11.8% yesterday, up from 8.8% in 03/2012. Pt states CBGs typically run in 200s at home. He takes Metformin alone. Will need insulin on discharge. Will continue SSI, resistant scale, and add AM Lantus. Instructed to administer 1/2 dose of Lantus this AM for pre-cath coverage. Patient's questions regarding plan answered.    Jacquelynn Cree, PA-C 09/16/2012 6:47 AM

## 2012-09-16 NOTE — Progress Notes (Signed)
Reviewed patient's history. Discussed issues around cardiac cath and possible PCI, specifically related to his renal function. I have reviewed his old cath reports, and will plan a right radial approach since his only graft is the [place]. Will make all attempts to minimize contrast. Meds reviewed and he's on no nephrotoxic agents. Volume status is good and creatinine is stable with CrCl > 45.  [name] 09/16/2012 7:59 AM

## 2012-09-16 NOTE — Discharge Summary (Signed)
Discharge Summary   Patient ID: Matthew Rowe.,  MRN: [id number], DOB/AGE: Jun 13, 1961 52 y.o.  Admit date: 09/15/2012 Discharge date: 09/16/2012  Primary Physician: Lamar Blinks, MD Primary Cardiologist: [name], MD  Discharge Diagnoses Principal Problem:  *Angina pectoris  - s/p cath 09/16/12: unchanged from prior study, continued medical therapy recommended Active Problems:  Dyslipidemia  - TG 483, TC 190, HDL 31, LDL unable to calc  - Simvastatin 20 replaced with atorvastatin 80  - Follow-up LFTs in 6 weeks  Coronary artery disease  Type 2 diabetes mellitus  - Hgb A1C 11.7%   - discharged on Lantus + Novolog insulin pens  - follow-up with PCP in 1 week recommneded  HTN (hypertension)  CKD (chronic kidney disease), stage III  Polycystic kidney disease  OSA on CPAP   Allergies Allergies  Allergen Reactions  . Ciprocin-Fluocin-Procin (Fluocinolone Acetonide) Rash  . Clarithromycin Itching and Other (See Comments)    "Biaxin" Eyes itch and burn  . Glimepiride (Amaryl) Other (See Comments)    Elevates liver function     Diagnostic Studies/Procedures  PORTABLE CHEST X-RAY - 09/15/12  IMPRESSION:  No acute cardiopulmonary abnormality seen.  CARDIAC CATHETERIZATION - 09/16/12  Hemodynamics:  AO 126/88  LV 128/18  Coronary angiography:  Coronary dominance: right  Left mainstem: Calcified, 40-50% distal LM stenosis  Left anterior descending (LAD): Proximal calcification. Patnet proximal stent with mild 30% ISR. Multiple mid-LAD stents with total occlusion within stented portion of the distal vessel. Diffuse 50% ISR prior to the occlusion.  Left circumflex (LCx): Patent, diffusely diseased ramus branch. 70% proximal LCx stenosis supplying 2 small marginal branches.  Right coronary artery (RCA): Diffusely diseased with 50% stenosis throughout the proximal and mid vessel. Competitive filling in the mid-vessel at the attachment of the [place]. Distal vessel with  diffuse 50% stenosis. The PDA and PLA branches are patent.  Left ventriculography: Deferred because of chronic kidney disease.  Final Conclusions:  1. Moderate diffuse 3 vessel CAD as described without significant change from previous study.  2. Continued patency of the RIMA-RCA  3. Anomalous RCA.  History of Present Illness  Mr. [name] is a 52 yo AA male who was observed overnight at [facility] on 09/16/11 with the above problem list. He has a h/o CAD (s/p CABG 2006, prior PCI), known chronic stable angina, ischemic CM, CKD (stage III), polycystic KD, DM2, HTN, HLD, OSA (on CPAP) and obesity. He had presented with Dr. Aundra Dubin in 06/2012 c/o CCS class I-II angina. He has been maximally optimized on Norvasc and Imdur. Ranexa has been limited to at most 500 mg in a day due to sedation. Coreg was up-titrated to 37.5mg  BID. Had been doing well until he awoke the morning of admission with severe substernal crushing chest discomfort reminiscent of his chronic angina, only more severe and radiating to his left arm, which was responsive to NTG.   EKG in the ED revealed prior inferior and anterior infarcts, but no evidence of ischemia. Initial TnI WNL. NTG patch was applied in the ED and he remained pain free. CXR revealed no acute abnormality. BMET revealed Cr 1.66 (baseline 1.4-1.6). He was also hyperglycemic with CBG in the 400s. Given his cardiac history and symptoms concerning for unstable angina, he was observed overnight with plans for diagnostic cardiac catheterization the following morning.   Hospital Course   He was continued on all outpatient medications excluding ARB, HCTZ and metformin for reno-protection prior to cath. He was started on SSI for  glycemic control with good response initially, however he requested a snack the evening before cath which was covered with Novolog, however CBGs continued to up-trend. Lantus was started in the AM, and 1/2 dose given prior to cath. An Hgb A1C was  11.7%, lipids TG 483, TC 190, HDL 31, LDL unable to calc. TSH WNL. He remained stable the following day. He was informed, consented and prepped for cath which is outlined in full above. There was no change from a prior 2012 study. Continued medical therapy was recommended. He tolerated the procedure well without complications.   He was assessed and deemed stable for discharge by Dr. Burt Knack. He will be switched from moderate-dose simvastatin to high-dose atorvastatin in an effort to achieve lipid control. LFTs will need to be checked in 6 weeks. Additionally, given markedly elevated A1C, he will be started on Lantus and Novolog insulin. He has been advised on diet & exercise as a means for glycemic control and lipid management. He will follow-up with his PCP for both of these issues within a week as recommended. Additional follow-up appointments have been scheduled as noted below. This information, including post-cath instructions, has been clearly outlined in the discharge AVS.   Discharge Vitals:  Blood pressure 138/95, pulse 84, temperature 98.7 F (37.1 C), temperature source Oral, resp. rate 18, height 5' 5.5" (1.664 m), weight 113.399 kg (250 lb), SpO2 98.00%.   Labs: Recent Labs  [facility] 09/16/12 0555 09/15/12 0755   WBC 8.3 6.6   HGB 12.2* 13.1   HCT 35.5* 38.8*   MCV 82.8 84.0   PLT 209 213    Lab 09/16/12 0555 09/15/12 0755  NA 134* 136  K 4.2 4.3  CL 101 102  CO2 19 19  BUN 23 23  CREATININE 1.65* 1.66*  CALCIUM 9.1 9.3  PROT -- 7.3  BILITOT -- 0.2*  ALKPHOS -- 124*  ALT -- 10  AST -- 13  AMYLASE -- --  LIPASE -- --  GLUCOSE 300* 410*   Recent Labs  Basename 09/15/12 1550   HGBA1C 11.7*   Recent Labs  Basename 09/15/12 2029 09/15/12 1550 09/15/12 0755   CKTOTAL -- -- --   CKMB -- -- --   CKMBINDEX -- -- --   TROPONINI <0.30 <0.30 <0.30   No components found with this basename: POCBNP Recent Labs  Basename 09/16/12 0555   CHOL 190   HDL 31*   LDLCALC  UNABLE TO CALCULATE IF TRIGLYCERIDE OVER 400 mg/dL   TRIG 483*   CHOLHDL 6.1   LDLDIRECT --    Basename 09/15/12 1550  TSH 2.801  T4TOTAL --  T3FREE --  THYROIDAB --    Disposition:  Discharge Orders    Future Appointments: Provider: Department: Dept Phone: Center:   09/26/2012 11:45 AM Larey Dresser, MD [place]) [phone] [facility]     Future Orders Please Complete By Expires   Diet - low sodium heart healthy      Increase activity slowly        Follow-up Information    Follow up with [name], MD. On 09/26/2012. (At 11:45 AM as previously scheduled. )    Contact information:   [address] [place] [address] [phone]       Follow up with Geoffry Paradise, MD. On 09/23/2012. (At 4:15 PM as previously scheduled.)    Contact information:   [place] [address] [phone]  Follow up with COPLAND,JESSICA, MD. Schedule an appointment as soon as possible for a visit in 1 week. (For post-hospital follow-up and management of diabetes. )    Contact information:   [place] [id number] [phone]         Discharge Medications:    Medication List     As of 09/16/2012  1:06 PM    START taking these medications         atorvastatin 80 MG tablet   Commonly known as: LIPITOR   Take 1 tablet (80 mg total) by mouth daily.      insulin aspart 100 UNIT/ML injection   Commonly known as: novoLOG   Inject 9 Units into the skin 3 (three) times daily before meals.      insulin glargine 100 UNIT/ML injection   Commonly known as: LANTUS   Inject 28 Units into the skin every morning.      CHANGE how you take these medications         ranolazine 500 MG 12 hr tablet   Commonly known as: RANEXA   Take one tablet daily. Take one tablet twice a day as tolerated.   What changed: doctor's instructions      CONTINUE taking these  medications         acetaminophen 500 MG tablet   Commonly known as: TYLENOL      amLODipine 10 MG tablet   Commonly known as: NORVASC   Take 1 tablet (10 mg total) by mouth daily.      aspirin 325 MG tablet      carvedilol 25 MG tablet   Commonly known as: COREG      gabapentin 300 MG capsule   Commonly known as: NEURONTIN   Take 1 capsule (300 mg total) by mouth 3 (three) times daily.      hydrochlorothiazide 25 MG tablet   Commonly known as: HYDRODIURIL   Take 1 tablet (25 mg total) by mouth daily as needed. For blood pressure      HYDROcodone-acetaminophen 5-500 MG per tablet   Commonly known as: VICODIN   Take 1 tablet by mouth every 8 (eight) hours as needed.      isosorbide mononitrate 60 MG 24 hr tablet   Commonly known as: IMDUR      losartan 100 MG tablet   Commonly known as: COZAAR   Take 0.5 tablets (50 mg total) by mouth daily.      metFORMIN 500 MG tablet   Commonly known as: GLUCOPHAGE   Take 1 tablet (500 mg total) by mouth 2 (two) times daily with a meal.   Start taking on: 09/18/2012      nitroGLYCERIN 0.4 MG SL tablet   Commonly known as: NITROSTAT   Place 1 tablet (0.4 mg total) under the tongue every 5 (five) minutes as needed.      pantoprazole 40 MG tablet   Commonly known as: PROTONIX      vitamin E 1000 UNIT capsule      zolpidem 10 MG tablet   Commonly known as: AMBIEN   Take 1 tablet (10 mg total) by mouth at bedtime as needed. For insomnia      STOP taking these medications         pravastatin 40 MG tablet   Commonly known as: PRAVACHOL          Where to get your medications    These are the prescriptions that you need to pick up.  We sent them to a specific pharmacy, so you will need to go there to get them.   [facility] [place], [place] - [address]    [place] [place] [address]    Phone: [phone]        atorvastatin 80 MG tablet         Information on where to get these  meds is not yet available. Ask your nurse or doctor.         insulin aspart 100 UNIT/ML injection   insulin glargine 100 UNIT/ML injection   metFORMIN 500 MG tablet   ranolazine 500 MG 12 hr tablet           Outstanding Labs/Studies: recommend BMET in 1 week  Duration of Discharge Encounter: Greater than 30 minutes including physician time.  Signed, R. Valeria Batman, PA-C 09/16/2012, 1:06 PM

## 2012-09-17 ENCOUNTER — Telehealth: Payer: Self-pay

## 2012-09-17 NOTE — Telephone Encounter (Signed)
Matthew Rowe had a procedure done in the cath lab, I have advised him I am sending the message to you. Matthew Rowe states Matthew Rowe is improving Matthew Rowe has questions about his medications for Dr Lorelei Pont. Matthew Rowe had insulin when Matthew Rowe was in the hospital and has questions about this. Matthew Rowe is also asking about his cholesterol medications. Do you want to call him, or would you like him to come in to discuss?

## 2012-09-17 NOTE — Telephone Encounter (Signed)
Patient would like Dr Lorelei Pont to call him regarding his recent release from the hospital.  Best [phone]

## 2012-09-18 NOTE — Telephone Encounter (Signed)
Called back and LMOM- I see that he has some questions about his medications.  He can give me a call back or just come and see me in the office tomorrow

## 2012-09-19 ENCOUNTER — Other Ambulatory Visit: Payer: Self-pay

## 2012-09-19 MED ORDER — NITROGLYCERIN 0.4 MG SL SUBL: 0.4000 mg | SUBLINGUAL_TABLET | SUBLINGUAL | Status: DC | PRN

## 2012-09-22 ENCOUNTER — Other Ambulatory Visit: Payer: Self-pay | Admitting: Physician Assistant

## 2012-09-22 ENCOUNTER — Ambulatory Visit (INDEPENDENT_AMBULATORY_CARE_PROVIDER_SITE_OTHER): Payer: Medicare Other | Admitting: Family Medicine

## 2012-09-22 ENCOUNTER — Other Ambulatory Visit: Payer: Self-pay | Admitting: Family Medicine

## 2012-09-22 VITALS — BP 130/88 | HR 85 | Temp 98.1°F | Resp 18 | Ht 65.0 in | Wt 217.0 lb

## 2012-09-22 DIAGNOSIS — E139 Other specified diabetes mellitus without complications: Secondary | ICD-10-CM

## 2012-09-22 DIAGNOSIS — E119 Type 2 diabetes mellitus without complications: Secondary | ICD-10-CM

## 2012-09-22 DIAGNOSIS — E1142 Type 2 diabetes mellitus with diabetic polyneuropathy: Secondary | ICD-10-CM

## 2012-09-22 DIAGNOSIS — I208 Other forms of angina pectoris: Secondary | ICD-10-CM

## 2012-09-22 DIAGNOSIS — I10 Essential (primary) hypertension: Secondary | ICD-10-CM

## 2012-09-22 DIAGNOSIS — I209 Angina pectoris, unspecified: Secondary | ICD-10-CM

## 2012-09-22 DIAGNOSIS — E1140 Type 2 diabetes mellitus with diabetic neuropathy, unspecified: Secondary | ICD-10-CM

## 2012-09-22 DIAGNOSIS — G4700 Insomnia, unspecified: Secondary | ICD-10-CM

## 2012-09-22 MED ORDER — NITROGLYCERIN 0.2 MG/HR TD PT24: 1.0000 | MEDICATED_PATCH | Freq: Every day | TRANSDERMAL | Status: DC

## 2012-09-22 MED ORDER — AMLODIPINE BESYLATE 10 MG PO TABS: 10.0000 mg | ORAL_TABLET | Freq: Every day | ORAL | Status: DC

## 2012-09-22 MED ORDER — ZOLPIDEM TARTRATE 10 MG PO TABS: 10.0000 mg | ORAL_TABLET | Freq: Every evening | ORAL | Status: DC | PRN

## 2012-09-22 MED ORDER — METFORMIN HCL 500 MG PO TABS: 500.0000 mg | ORAL_TABLET | Freq: Two times a day (BID) | ORAL | Status: DC

## 2012-09-22 MED ORDER — LOSARTAN POTASSIUM 100 MG PO TABS: 50.0000 mg | ORAL_TABLET | Freq: Every day | ORAL | Status: DC

## 2012-09-22 MED ORDER — GABAPENTIN 300 MG PO CAPS: 300.0000 mg | ORAL_CAPSULE | Freq: Three times a day (TID) | ORAL | Status: DC

## 2012-09-22 NOTE — Progress Notes (Signed)
52 yo diabetic, hypertensive man comes in for follow up of chest pain, cardiac cath last Thursday, 4 days ago.  He was told there was no change in the cardiac study.  His chest pain was a heavy pressure with left arm pain and left arm sweatiness.  He was out of nitroglycerin at the time.  The EMS and ED providers gave him nitro which helped reduce the pain.  He does have angina with increased activity.  He is still having  Chest pains but they are more of a dull background ache.  He goes to the [facility], [name], MD, and patient has appt on the 17th.  Patient has altered diet and blood sugar is now under 200.  He does not have the finances to afford his insulin  Objective:  Alert, NAD, good eye contact Chest:  Clear, well-healed thoracotomy scar. Heart: reg, no murmur  Assessment:  High risk cardiac patient who is now stable following intense chest pain episode at which point AMI was ruled out  Plan:  Continue modified diet, start nitro patch,  Follow up with Dr. Aundra Dubin in 3 days. 1. HTN (hypertension)  amLODipine (NORVASC) 10 MG tablet, losartan (COZAAR) 100 MG tablet  2. Diabetes 1.5, managed as type 1  metFORMIN (GLUCOPHAGE) 500 MG tablet  3. Diabetic neuropathy  gabapentin (NEURONTIN) 300 MG capsule  4. Angina effort  nitroGLYCERIN (NITRODUR - DOSED IN MG/24 HR) 0.2 mg/hr  5. Insomnia  zolpidem (AMBIEN) 10 MG tablet

## 2012-09-22 NOTE — Patient Instructions (Signed)
Continue to avoid carbohydrate products. Keep appointment with Dr. Aundra Dubin

## 2012-09-23 ENCOUNTER — Telehealth: Payer: Self-pay

## 2012-09-23 MED ORDER — ISOSORBIDE MONONITRATE ER 60 MG PO TB24: 120.0000 mg | ORAL_TABLET | Freq: Every day | ORAL | Status: DC

## 2012-09-23 NOTE — Telephone Encounter (Signed)
PT STATES HE WAS HOSPITALIZED FOR CHEST PAINS AND WAS TOLD NOT TO TAKE THE LIPITOR ANYMORE AND WHEN HE CAME IN LAST NIGHT AND SAW DR KURT, DR KURT TOLD HIM NOT TO TAKE ANYMORE BUT HE PUT IT ON THE MEDICINES THAT WERE CALLED IN. HE IS REALLY AFRAID TO TAKE ANYTHING UNTIL HE HEARS FROM [place] PLEASE CALL [phone]

## 2012-09-23 NOTE — Telephone Encounter (Signed)
He has stated he will not take the lipitor, due to the recent reports he has seen on television. I have sent in imdur for him. The patient does plan to keep the cardiology appt.

## 2012-09-23 NOTE — Telephone Encounter (Signed)
Called patient. He is asking about Isosorbide 60mg  (bid), wants to know if he should continue this, please advise. If he is to continue this he will need Rx. [facility] uses [facility].  Patient advised also he will not be taking the Lipitor. I have told him we will take this off his list.

## 2012-09-23 NOTE — Telephone Encounter (Signed)
Per pt's discharge note he should continue Imdur, additionally he IS supposed to be taking lipitor 80mg  daily because his cholesterol was very high while he was in the hospital.  Follow up with cardiology as planned on 09/26/12

## 2012-09-26 ENCOUNTER — Encounter: Payer: Self-pay | Admitting: Cardiology

## 2012-09-26 ENCOUNTER — Ambulatory Visit (INDEPENDENT_AMBULATORY_CARE_PROVIDER_SITE_OTHER): Payer: Medicare PPO | Admitting: Cardiology

## 2012-09-26 VITALS — BP 122/80 | HR 85 | Ht 65.0 in | Wt 222.0 lb

## 2012-09-26 DIAGNOSIS — I209 Angina pectoris, unspecified: Secondary | ICD-10-CM

## 2012-09-26 DIAGNOSIS — I2510 Atherosclerotic heart disease of native coronary artery without angina pectoris: Secondary | ICD-10-CM

## 2012-09-26 DIAGNOSIS — I25810 Atherosclerosis of coronary artery bypass graft(s) without angina pectoris: Secondary | ICD-10-CM

## 2012-09-26 DIAGNOSIS — I10 Essential (primary) hypertension: Secondary | ICD-10-CM

## 2012-09-26 DIAGNOSIS — E785 Hyperlipidemia, unspecified: Secondary | ICD-10-CM

## 2012-09-26 LAB — BASIC METABOLIC PANEL
BUN: 26 mg/dL — ABNORMAL HIGH (ref 6–23)
Chloride: 108 mEq/L (ref 96–112)
GFR: 46.83 mL/min — ABNORMAL LOW (ref >60.00)
Glucose, Bld: 255 mg/dL — ABNORMAL HIGH (ref 70–99)
Potassium: 4.6 mEq/L (ref 3.5–5.1)
Sodium: 138 mEq/L (ref 135–145)

## 2012-09-26 MED ORDER — PRAVASTATIN SODIUM 40 MG PO TABS: 40.0000 mg | ORAL_TABLET | Freq: Every evening | ORAL | Status: DC

## 2012-09-26 NOTE — Patient Instructions (Addendum)
Start pravastatin 40mg  daily in the evening.   Your physician recommends that you have  lab work today--BMET.  Your physician recommends that you schedule a follow-up appointment in: 2 months with Dr Aundra Dubin.  Your physician recommends that you return for a FASTING lipid profile /liver profile in 2 months a few days before your appointment with Dr Aundra Dubin.

## 2012-09-29 NOTE — Progress Notes (Signed)
Patient ID: Matthew Rowe., male   DOB: [birth year], 52 y.o.   MRN: [id number] PCP: Dr. Lorelei Pont  52 yo with history CAD s/p CABG (RIMA to RCA, anomalous RCA off left cusp), occlusion of the distal LAD, and chronic angina presents for cardiology followup.  Patient has had chronic angina for years. Several weeks ago, he developed progressive angina and was hospitalized by [facility] Dopp earlier this month.  LHC was done, showing unchanged anatomy.  RIMA-RCA was patent.  Since then, he has tolerated uptitration of ranolazine to 500 mg bid.  He had had oversedation with this dose of ranolazine in the past but has had no ill effects from the change.  He thinks increased ranolazine has helped.  He is now having chest pain only with moderate exertion (not at rest or with mild exertion).  He is out of pravastatin.     Labs (7/13): K 4.9, creatinine 1.75, LDL 63 Labs (1/14): K 4.2, creatinine 1.65, TGs 483 (could not calculate LDL)  ECG: NSR, LVH, poor anterior R wave progression, normal QT interval  PMH: 1. CAD: CABG 2006 at [facility] with [place].  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  Patient has had prior extensive PCI to LAD with occlusion of the distal LAD.  LHC (7/12): proximal LAD stent, extensive mid-distal LAD stenting, 60-70% mid LAD instent restenosis, subtotal distal LAD instent restenosis, LCFx without significant disease, anomalous RCA off left cusp with mild disease, patent RIMA-RCA.  Echo (3/12): EF 55-60%, trivial AI, mild LVH.  Lexiscan myoview (9/13) with small apical scar, moderate inferior scar from base to apex with some reversibility, EF 47% with apical hypokinesis (similar to prior study).  Chronic angina.  LHC (1/14): total occlusion of distal LAD, diffuse disease in ramus, 70% proximal LCx, 50% proximal and mid RCA, 50% distal RCA, patent RIMA-RCA.  2. OSA: CPAP. 3. Type II diabetes 4. Hyperlipidemia: had side effects from Lipitor.  5. HTN 6. CKD:  polycystic kidney disease.  7. Ischemic cardiomyopathy: EF 55-60% by echo in 3/12, EF 47% with apical hypokinesis in 9/13.   SH: Nonsmoker.  Married, lives in [place].  On disability.   FH: Multiple family members with MIs.   ROS: All systems reviewed and negative except as per HPI.   Current Outpatient Prescriptions  Medication Sig Dispense Refill  . acetaminophen (TYLENOL) 500 MG tablet Take 1,000 mg by mouth 2 (two) times daily as needed. For pain      . amLODipine (NORVASC) 10 MG tablet Take 1 tablet (10 mg total) by mouth daily.  90 tablet  2  . aspirin 325 MG tablet Take 325 mg by mouth daily.       . carvedilol (COREG) 25 MG tablet Take 37.5 mg by mouth 2 (two) times daily with a meal. 1 and 1/2 tablets (total 37.5mg ) two times a day      . gabapentin (NEURONTIN) 300 MG capsule Take 1 capsule (300 mg total) by mouth 3 (three) times daily.  90 capsule  5  . hydrochlorothiazide (HYDRODIURIL) 25 MG tablet Take 1 tablet (25 mg total) by mouth daily as needed. For blood pressure  90 tablet  3  . HYDROcodone-acetaminophen (VICODIN) 5-500 MG per tablet Take 1 tablet by mouth every 8 (eight) hours as needed.  15 tablet  0  . insulin aspart (NOVOLOG) 100 UNIT/ML injection Inject 9 Units into the skin 3 (three) times daily before meals.  1 vial  12  .  insulin glargine (LANTUS) 100 UNIT/ML injection Inject 28 Units into the skin every morning.  10 mL  3  . isosorbide mononitrate (IMDUR) 60 MG 24 hr tablet Take 2 tablets (120 mg total) by mouth daily.  60 tablet  2  . losartan (COZAAR) 100 MG tablet Take 0.5 tablets (50 mg total) by mouth daily.  45 tablet  3  . metFORMIN (GLUCOPHAGE) 500 MG tablet Take 1 tablet (500 mg total) by mouth 2 (two) times daily with a meal.      . metFORMIN (GLUCOPHAGE) 500 MG tablet TAKE ONE TABLET BY MOUTH TWICE DAILY  90 tablet  0  . nitroGLYCERIN (NITRODUR - DOSED IN MG/24 HR) 0.2 mg/hr Place 1 patch (0.2 mg total) onto the skin daily.  30 patch  12  .  nitroGLYCERIN (NITROSTAT) 0.4 MG SL tablet Place 1 tablet (0.4 mg total) under the tongue every 5 (five) minutes as needed.  100 tablet  1  . pantoprazole (PROTONIX) 40 MG tablet Take 40 mg by mouth 2 (two) times daily with a meal.      . ranolazine (RANEXA) 500 MG 12 hr tablet Take one tablet daily. Take one tablet twice a day as tolerated.      . vitamin E 1000 UNIT capsule Take 1,000 Units by mouth 2 (two) times daily.        Marland Kitchen zolpidem (AMBIEN) 10 MG tablet Take 1 tablet (10 mg total) by mouth at bedtime as needed. For insomnia  30 tablet  0  . pravastatin (PRAVACHOL) 40 MG tablet Take 1 tablet (40 mg total) by mouth every evening.  30 tablet  2    BP 122/80  Pulse 85  Ht 5\' 5"  (1.651 m)  Wt 222 lb (100.699 kg)  BMI 36.94 kg/m2 General: NAD, obese.  Neck: No JVD, no thyromegaly or thyroid nodule.  Lungs: Clear to auscultation bilaterally with normal respiratory effort. CV: Nondisplaced PMI.  Heart regular S1/S2, no S3/S4, no murmur.  No peripheral edema.  No carotid bruit.  Normal pedal pulses.  Abdomen: Soft, nontender, no hepatosplenomegaly, no distention.  Neurologic: Alert and oriented x 3.  Psych: Normal affect. Extremities: No clubbing or cyanosis.   Assessment/Plan: 1. CAD: Chronic angina.  Improved with increase in ranolazine.  Recent LHC showed stable anatomy.  He will continue ASA 81, statin, Coreg, amlodipine, ARB, and Imdur.  For angina, would continue current doses of Coreg, amlodipine, and Imdur.  If he continues to tolerate ranolazine at 500 mg bid, I will plan on increasing him to 1000 mg bid when I see him back in 2 months.  I will refer him for cardiac rehab.  2. HTN: BP controlled.  3. Hyperlipidemia: He has been off pravastatin.  Restart at 40 mg daily today.  He did not tolerate atorvastatin in the past.  4. CKD: Patient has APKD.  Will get post-cath BMET today.   [name] 09/29/2012

## 2012-09-30 NOTE — Addendum Note (Signed)
Addended by: Janne Napoleon on: 09/30/2012 04:24 PM   Modules accepted: Orders

## 2012-10-21 ENCOUNTER — Other Ambulatory Visit: Payer: Self-pay | Admitting: Physician Assistant

## 2012-10-30 ENCOUNTER — Telehealth: Payer: Self-pay

## 2012-10-30 NOTE — Telephone Encounter (Signed)
I believe his cardiologist should be Rx his Ranexa.  If the vicodin is for his chest pain then that should also come from the cards because it does not look like it is a chronic med that we rx.  If the cards does not do pain med he will need an ov.

## 2012-10-30 NOTE — Telephone Encounter (Signed)
Follow up plan not clear. Please advise on renewal of Ranexa/ Vicodin will have to be sent to Dr L. Perhaps his cardiologist may need to take this over.

## 2012-10-30 NOTE — Telephone Encounter (Signed)
Patient called requesting a refill on ranexa and Vicodin. He says he is completely out of them and if this can please be processed fast thank you! Best number is [phone]

## 2012-10-30 NOTE — Telephone Encounter (Signed)
Was here in Jan.

## 2012-10-31 ENCOUNTER — Emergency Department (HOSPITAL_COMMUNITY): Payer: Medicare PPO

## 2012-10-31 ENCOUNTER — Encounter (HOSPITAL_COMMUNITY): Payer: Self-pay | Admitting: *Deleted

## 2012-10-31 ENCOUNTER — Emergency Department (HOSPITAL_COMMUNITY)
Admission: EM | Admit: 2012-10-31 | Discharge: 2012-10-31 | Disposition: A | Discharge status: Home / Self Care | Payer: Medicare PPO | Attending: Emergency Medicine | Admitting: Emergency Medicine

## 2012-10-31 DIAGNOSIS — R9439 Abnormal result of other cardiovascular function study: Secondary | ICD-10-CM | POA: Insufficient documentation

## 2012-10-31 DIAGNOSIS — E785 Hyperlipidemia, unspecified: Secondary | ICD-10-CM | POA: Insufficient documentation

## 2012-10-31 DIAGNOSIS — I252 Old myocardial infarction: Secondary | ICD-10-CM | POA: Insufficient documentation

## 2012-10-31 DIAGNOSIS — Z7982 Long term (current) use of aspirin: Secondary | ICD-10-CM | POA: Insufficient documentation

## 2012-10-31 DIAGNOSIS — Z794 Long term (current) use of insulin: Secondary | ICD-10-CM | POA: Insufficient documentation

## 2012-10-31 DIAGNOSIS — E119 Type 2 diabetes mellitus without complications: Secondary | ICD-10-CM | POA: Insufficient documentation

## 2012-10-31 DIAGNOSIS — I129 Hypertensive chronic kidney disease with stage 1 through stage 4 chronic kidney disease, or unspecified chronic kidney disease: Secondary | ICD-10-CM | POA: Insufficient documentation

## 2012-10-31 DIAGNOSIS — R9431 Abnormal electrocardiogram [ECG] [EKG]: Secondary | ICD-10-CM | POA: Insufficient documentation

## 2012-10-31 DIAGNOSIS — Z95818 Presence of other cardiac implants and grafts: Secondary | ICD-10-CM | POA: Insufficient documentation

## 2012-10-31 DIAGNOSIS — N183 Chronic kidney disease, stage 3 unspecified: Secondary | ICD-10-CM | POA: Insufficient documentation

## 2012-10-31 DIAGNOSIS — R0602 Shortness of breath: Secondary | ICD-10-CM | POA: Insufficient documentation

## 2012-10-31 DIAGNOSIS — I2510 Atherosclerotic heart disease of native coronary artery without angina pectoris: Secondary | ICD-10-CM | POA: Insufficient documentation

## 2012-10-31 DIAGNOSIS — I2589 Other forms of chronic ischemic heart disease: Secondary | ICD-10-CM | POA: Insufficient documentation

## 2012-10-31 DIAGNOSIS — Z79899 Other long term (current) drug therapy: Secondary | ICD-10-CM | POA: Insufficient documentation

## 2012-10-31 DIAGNOSIS — K219 Gastro-esophageal reflux disease without esophagitis: Secondary | ICD-10-CM | POA: Insufficient documentation

## 2012-10-31 DIAGNOSIS — R110 Nausea: Secondary | ICD-10-CM | POA: Insufficient documentation

## 2012-10-31 DIAGNOSIS — Z91199 Patient's noncompliance with other medical treatment and regimen due to unspecified reason: Secondary | ICD-10-CM | POA: Insufficient documentation

## 2012-10-31 DIAGNOSIS — Z9889 Other specified postprocedural states: Secondary | ICD-10-CM | POA: Insufficient documentation

## 2012-10-31 DIAGNOSIS — I209 Angina pectoris, unspecified: Secondary | ICD-10-CM | POA: Insufficient documentation

## 2012-10-31 DIAGNOSIS — Z9119 Patient's noncompliance with other medical treatment and regimen: Secondary | ICD-10-CM | POA: Insufficient documentation

## 2012-10-31 DIAGNOSIS — G4733 Obstructive sleep apnea (adult) (pediatric): Secondary | ICD-10-CM | POA: Insufficient documentation

## 2012-10-31 LAB — CBC
HCT: 35.2 % — ABNORMAL LOW (ref 39.0–52.0)
MCH: 28.8 pg (ref 26.0–34.0)
MCV: 83 fL (ref 78.0–100.0)
RBC: 4.24 MIL/uL (ref 4.22–5.81)
WBC: 6.7 10*3/uL (ref 4.0–10.5)

## 2012-10-31 LAB — BASIC METABOLIC PANEL
BUN: 25 mg/dL — ABNORMAL HIGH (ref 6–23)
CO2: 20 mEq/L (ref 19–32)
Calcium: 9.4 mg/dL (ref 8.4–10.5)
Chloride: 105 mEq/L (ref 96–112)
Creatinine, Ser: 1.44 mg/dL — ABNORMAL HIGH (ref 0.50–1.35)

## 2012-10-31 LAB — GLUCOSE, CAPILLARY: Glucose-Capillary: 192 mg/dL — ABNORMAL HIGH (ref 70–99)

## 2012-10-31 MED ORDER — SODIUM CHLORIDE 0.9 % IV BOLUS (SEPSIS): 1000.0000 mL | Freq: Once | INTRAVENOUS | Status: DC

## 2012-10-31 MED ORDER — ONDANSETRON HCL 4 MG/2ML IJ SOLN
4.0000 mg | Freq: Once | INTRAMUSCULAR | Status: AC
  Administered 2012-10-31: 4 mg via INTRAVENOUS
  Filled 2012-10-31: qty 2

## 2012-10-31 MED ORDER — RANOLAZINE ER 500 MG PO TB12
500.0000 mg | ORAL_TABLET | Freq: Two times a day (BID) | ORAL | Status: DC
  Administered 2012-10-31: 500 mg via ORAL
  Filled 2012-10-31 (×2): qty 1

## 2012-10-31 MED ORDER — NITROGLYCERIN 0.4 MG SL SUBL: 0.4000 mg | SUBLINGUAL_TABLET | SUBLINGUAL | Status: DC | PRN

## 2012-10-31 MED ORDER — NITROGLYCERIN IN D5W 200-5 MCG/ML-% IV SOLN
5.0000 ug/min | Freq: Once | INTRAVENOUS | Status: AC
  Administered 2012-10-31: 5 ug/min via INTRAVENOUS
  Filled 2012-10-31: qty 250

## 2012-10-31 MED ORDER — MORPHINE SULFATE 4 MG/ML IJ SOLN
4.0000 mg | Freq: Once | INTRAMUSCULAR | Status: AC
  Administered 2012-10-31: 4 mg via INTRAVENOUS
  Filled 2012-10-31: qty 1

## 2012-10-31 NOTE — ED Notes (Signed)
Pt reports he took 4 SL nitro with little relief.

## 2012-10-31 NOTE — ED Notes (Signed)
Pt is here for CP.  Pt states that he has been having CP since yesterday.  Pt has nitro patch on and has been taking nitro sl with some temporary relief.  Pt was brought in by his wife by private vehicle but was unable to walk in due to increasing CP.  Helped pt in with wheelchair.  Pt is alert and oriented.

## 2012-10-31 NOTE — Consult Note (Signed)
Cardiology Consult Note   Patient ID: Matthew SCHNACK Sr. MRN: [id number], DOB/AGE: [birth year]   Admit date: 10/31/2012 Date of Consult: 10/31/2012  Primary Physician: Lamar Blinks, MD Primary Cardiologist: [name], MD  Reason for consult: chest pain  HPI: Matthew Rowe. is a 52 y.o. AA male with PMHx s/f CAD (s/p CABG 2006, PCI, see angiographic data below), known chronic stable angina, ischemic CM, CKD (stage III, baseline Cr 1.6-1.8), polycystic KD, DM2, HTN, HLD, OHS/OSA (on CPAP) and obesity who presents to the [facility] ED c/o chest pain.   He was seen last month for progressive angina, underwent cardiac cath which revealed no interval change in CAD and continued medical management was recommended. Antianginals in Norvasc, Coreg, Imdur and Ranexa had been added, and were continued. He had some intolerance to up-titrating Ranexa in the past due to oversedation. This was increased on discharged, and was tolerating it well on follow-up with Dr. Aundra Dubin. This also corresponded to an improvement of angina with only moderate exertion.  He has since ran out of this medication (2-3 weeks ago), and has concerns with affordability. He reports worsening exertional chest discomfort during this time, now occurring at rest rated at a 10/10 with associated lightheadedness and weakness. No increased SOB, LE edema, PND, orthopnea or syncope. No active bleeding, fevers, chills, new cough.  In the ED, EKG reveals no acute ischemic changes. Initial trop-I WNL. BMET- Cr 1.44. CBC- Hgb 12.2, Hct 35.2. CXR- low volume without evidence of cardiopulmonary disease. He was started on NTG gtt with chest pain improvement to 1/10.    Problem List: Past Medical History  Diagnosis Date  . Chest pain     Chronic  . CKD (chronic kidney disease)   . Dyslipidemia   . Coronary artery disease     a. s/p INF MI 1999=>PCI of RCA;  b.  prior extensive stenting of LAD with subsequent stent occlusion  distally;  c.  s/p CABG with RIMA to RCA in 2006;  d.  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 Abnormal stress test     a. 11/11:  Myoview: apical infarct with ? small focus of inducible ischemia dInf wall, and Inf Apex, EF 46%;  b. Myoview 9/13:  mild apical scar with mild reversibility and mod inf scar with mod reverisibility, EF 47% => stable and med Rx cont'd   . Hypertension   . Polycystic kidney disease   . Hx of echocardiogram     a. Echo 3/12:  mild LVH, EF 55-60%, trivial AI  . Acute MI, inferior wall 1999  . Heart attack 2001; 2003; 2006  . Exertional dyspnea     "sometimes" (09/15/2012)  . DM2 (diabetes mellitus, type 2)   . History of blood transfusion ?2011  . GERD (gastroesophageal reflux disease)     "once; came to hospital" (09/15/2012)  . OSA on CPAP     Past Surgical History  Procedure Laterality Date  . Cystectomy  1990's    off face  . Coronary angioplasty with stent placement  1999 / 2002 / 2004 / 2006?  Marland Kitchen Cardiac catheterization  2012  . Coronary artery bypass graft  2006    CABG X?; in [place]  . Cardiac catheterization  2013     Allergies:  Allergies  Allergen Reactions  .  Ciprocin-Fluocin-Procin (Fluocinolone Acetonide) Rash  . Clarithromycin Itching and Other (See Comments)    "Biaxin" Eyes itch and burn  . Glimepiride (Amaryl) Other (See Comments)    Elevates liver function     Home Medications: Prior to Admission medications   Medication Sig Start Date End Date Taking? Authorizing Provider  acetaminophen (TYLENOL) 500 MG tablet Take 1,000 mg by mouth 2 (two) times daily as needed. For pain   Yes Historical Provider, MD  amLODipine (NORVASC) 10 MG tablet Take 1 tablet (10 mg total) by mouth daily. 09/22/12  Yes Robyn Haber, MD  aspirin 325 MG tablet Take 325 mg by mouth daily.    Yes Historical  Provider, MD  carvedilol (COREG) 25 MG tablet Take 37.5 mg by mouth 2 (two) times daily with a meal. 1 and 1/2 tablets (total 37.5mg ) two times a day 06/27/12  Yes Larey Dresser, MD  gabapentin (NEURONTIN) 300 MG capsule Take 1 capsule (300 mg total) by mouth 3 (three) times daily. 09/22/12  Yes Robyn Haber, MD  hydrochlorothiazide (HYDRODIURIL) 25 MG tablet Take 1 tablet (25 mg total) by mouth daily as needed. For blood pressure 03/27/12 04/27/13 Yes Jessica C Copland, MD  isosorbide mononitrate (IMDUR) 60 MG 24 hr tablet Take 2 tablets (120 mg total) by mouth daily. 09/23/12  Yes Eleanore E Elana Alm, PA-C  losartan (COZAAR) 100 MG tablet Take 0.5 tablets (50 mg total) by mouth daily. 09/22/12  Yes Robyn Haber, MD  metFORMIN (GLUCOPHAGE) 500 MG tablet Take 1 tablet (500 mg total) by mouth 2 (two) times daily with a meal. 09/22/12  Yes Robyn Haber, MD  nitroGLYCERIN (NITRODUR - DOSED IN MG/24 HR) 0.2 mg/hr Place 1 patch (0.2 mg total) onto the skin daily. 09/22/12  Yes Robyn Haber, MD  nitroGLYCERIN (NITROSTAT) 0.4 MG SL tablet Place 1 tablet (0.4 mg total) under the tongue every 5 (five) minutes as needed. 09/19/12  Yes Peter M Martinique, MD  pantoprazole (PROTONIX) 40 MG tablet Take 40 mg by mouth 2 (two) times daily with a meal. 05/29/12 05/29/13 Yes Liliane Shi, PA  pravastatin (PRAVACHOL) 40 MG tablet Take 1 tablet (40 mg total) by mouth every evening. 09/26/12  Yes Larey Dresser, MD  vitamin E 1000 UNIT capsule Take 1,000 Units by mouth 2 (two) times daily.     Yes Historical Provider, MD  zolpidem (AMBIEN) 10 MG tablet Take 1 tablet (10 mg total) by mouth at bedtime as needed. For insomnia 09/22/12  Yes Robyn Haber, MD  HYDROcodone-acetaminophen (VICODIN) 5-500 MG per tablet Take 1 tablet by mouth every 8 (eight) hours as needed. 08/06/12   Darreld Mclean, MD  metFORMIN (GLUCOPHAGE) 500 MG tablet TAKE ONE TABLET BY MOUTH TWICE DAILY 10/21/12   Rise Mu, PA-C  ranolazine (RANEXA)  500 MG 12 hr tablet Take one tablet daily. Take one tablet twice a day as tolerated. 09/16/12   Meriel Pica, PA-C    Inpatient Medications:  . ranolazine  500 mg Oral BID    (Not in a hospital admission)  Family History  Problem Relation Age of Onset  . Cancer Mother   . Diabetes Father   . Heart disease Father   . Anemia Mother   . Heart attack Father   . Heart failure Father   . Hyperlipidemia Father   . Hypertension Father   . Kidney disease Mother   . Sudden death Father   . Kidney failure Sister 5  . Heart attack Sister   .  Kidney disease Sister      History   Social History  . Marital Status: Married    Spouse Name: N/A    Number of Children: N/A  . Years of Education: N/A   Occupational History  . Not on file.   Social History Main Topics  . Smoking status: Never Smoker   . Smokeless tobacco: Never Used     Comment: smoked some as a teenager (high school)  . Alcohol Use: No     Comment: 09/15/2012 "drank a little when I was young"  . Drug Use: No  . Sexually Active: Yes   Other Topics Concern  . Not on file   Social History Narrative  . No narrative on file     Review of Systems: General: positive for weakness, negative for chills, fever, night sweats or weight changes.  Cardiovascular: positive for chest pain, negative for dyspnea on exertion, edema, orthopnea, palpitations, paroxysmal nocturnal dyspnea or shortness of breath Dermatological: negative for rash Respiratory: negative for cough or wheezing Urologic: negative for hematuria Abdominal: negative for nausea, vomiting, diarrhea, bright red blood per rectum, melena, or hematemesis Neurologic: positive for lightheadedness, negative for visual changes, syncope All other systems reviewed and are otherwise negative except as noted above.  Physical Exam: Blood pressure 141/101, pulse 79, temperature 98.3 F (36.8 C), temperature source Oral, resp. rate 23, height 5\' 5"  (1.651 m), weight  102.059 kg (225 lb), SpO2 100.00%.   General:  Obese, well developed, well nourished, in no acute distress. Head: Normocephalic, atraumatic, sclera non-icteric, no xanthomas, nares are without discharge.  Neck: Negative for carotid bruits. JVD not elevated. Lungs: Reduced inspiratory effort, clear bilaterally to auscultation without wheezes, rales, or rhonchi. Breathing is unlabored. Heart: RRR with S1 S2. II/VI systolic murmur at LUSB. No rubs or gallops appreciated. Abdomen: Soft, non-tender, central adiposity, with normoactive bowel sounds. No hepatomegaly. No rebound/guarding. No obvious abdominal masses. Msk:  Strength and tone appears normal for age. Extremities: No clubbing, cyanosis or edema.  Distal pedal pulses are 2+ and equal bilaterally. Neuro: Alert and oriented X 3. Moves all extremities spontaneously. Psych:  Responds to questions appropriately with a normal affect.  Labs: Recent Labs     10/31/12  0956  WBC  6.7  HGB  12.2*  HCT  35.2*  MCV  83.0  PLT  196    Recent Labs Lab 10/31/12 0956  NA 138  K 4.5  CL 105  CO2 20  BUN 25*  CREATININE 1.44*  CALCIUM 9.4  GLUCOSE 307*   Radiology/Studies: Dg Chest Port 1 View  10/31/2012  *RADIOLOGY REPORT*  Clinical Data: Chest pain.  PORTABLE CHEST - 1 VIEW  Comparison: None.  Findings: Low volume chest.  Median sternotomy.  Broken sternal wires appear unchanged. Monitoring leads are projected over the chest.  Cardiopericardial silhouette is mildly enlarged.  There appears to be coronary artery stent in the region of the left anterior descending or circumflex coronary artery.  The no focal consolidation.  No effusion.  IMPRESSION: Low volume chest without acute cardiopulmonary disease.   Original Report Authenticated By: Dereck Ligas, M.D.     EKG: NSR, 84 bpm, LAE, LAD, LVH, Q waves II, III, aVF  ASSESSMENT AND PLAN:   52yo AA male with CAD (s/p CABG & prior PCI), known chronic stable angina, ischemic CM (EF 47%),  CKD (stage III, baseline Cr 1.6-1.8), polycystic KD, DM2, HTN, HLD, OSA (on CPAP) and obesity who presents to [facility] ED with  chest pain.   1. Chronic stable angina, CCS class III-IV  2. Ischemic cardiomyopathy  3. CKD, stage III  -- polycystic kidney disease  4. Type 2 DM  5. HTN  6. HLD  7. OSA on CPAP 8. Obesity   DISCUSSION/PLAN:  Patient has run out of his Ranexa which corresponds to his worsening anginal symptoms. There is no objective evidence of ischemia in the ED. He was cathed last month with no change in CAD. He has concerns regarding affordability despite insurance coverage. Have requested CSW assistance with pricing. The patient has been provided with coupons and online resources. Ranexa and NTG SL refills have been called in to his outpatient pharmacy. Will discontinued NTG gtt and give one dose of Ranexa in the ED prior to discharge. He has an office visit with Dr. Aundra Dubin next month which will be resumed. Further adjustments to Ranexa will be considered at that time.    Signed, R. Valeria Batman, PA-C 10/31/2012, 12:09 PM    History and all data above reviewed.  Patient examined.  I agree with the findings as above. The patient has been out of Ranexa.  He is having the same pain that he gets usually when out of this med.  He has no objective evidence of ischemia in the ER and is pain free on IV NTG.  The patient exam reveals COR:RRR  ,  Lungs: Clear  ,  Abd: Positive bowel sounds, no rebound no guarding, Ext No edema.  All available labs, radiology testing, previous records reviewed. Agree with documented assessment and plan. Given the fact that this is his usual pattern and he just had a cath and there is no objective evidence of ischemia I do not think that he needs to be admitted.  However, we need to give him a dose of the Ranexa, stop the IV NTG and make sure he can get his Ranexa today and a new bottle of SL NTG today.  The ER caseworker is helping [place] with this.   Jeneen Rinks  [place]  12:09 PM  10/31/2012

## 2012-10-31 NOTE — Telephone Encounter (Signed)
Called patient to advise he should follow up here or with cardiology, his wife states he is in ER now, his chest pain has increased.

## 2012-10-31 NOTE — ED Notes (Addendum)
CBG 192, notified Chisholm PA

## 2012-10-31 NOTE — ED Provider Notes (Signed)
History     CSN: [id number]  Arrival date & time 10/31/12  [id number]   First MD Initiated Contact with Patient 10/31/12 [phone]      Chief Complaint  Patient presents with  . Chest Pain    (Consider location/radiation/quality/duration/timing/severity/associated sxs/prior treatment) HPI  52 year old male with h/o CAD (s/p CABG 2006, prior PCI), known chronic stable angina, ischemic CM, CKD (stage III), polycystic KD, DM2, HTN, HLD, OSA (on CPAP) presents with CP.  Patient reports for the past 2 days he has been experiencing intermittent pain across his chest which radiates to both arms. Pain is worsened with exertion, sharp, and intense, relief with taken Vicodin and sublingual nitroglycerin. Pain can last from seconds to hours. There is some associated shortness of breath and mild nausea without diaphoresis. This morning he took 324 aspirins, and sublingual nitroglycerin however his pain intensified with walking. Initially pain was 10 out of 10 and it has reduced to an 8/10. Patient denies fever, chills, productive cough, hemoptysis, vomiting, diarrhea, abdominal pain, leg swelling, calf pain.   Most recent cardiac cath was on 1/6 2014 which shows: Moderate diffuse 3 vessel CAD as described without significant change from previous study.  2. Continued patency of the RIMA-RCA  3. Anomalous RCA.  Since released from the last hospital admission in January, patient was prescribed 3 different medications, 2 of which are insulin injections. He is unable to afford his medicines and currently taking metformin only. He was also prescribed Ranexa but has been without it for the past week due to inability to pay.  Past Medical History  Diagnosis Date  . Chest pain     Chronic  . CKD (chronic kidney disease)   . Dyslipidemia   . Coronary artery disease     a. s/p INF MI 1999=>PCI of RCA;  b.  prior extensive stenting of LAD with subsequent stent occlusion distally;  c.  s/p CABG with RIMA to RCA in 2006;   d.  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 Abnormal stress test     a. 11/11:  Myoview: apical infarct with ? small focus of inducible ischemia dInf wall, and Inf Apex, EF 46%;  b. Myoview 9/13:  mild apical scar with mild reversibility and mod inf scar with mod reverisibility, EF 47% => stable and med Rx cont'd   . Hypertension   . Polycystic kidney disease   . Hx of echocardiogram     a. Echo 3/12:  mild LVH, EF 55-60%, trivial AI  . Acute MI, inferior wall 1999  . Heart attack 2001; 2003; 2006  . Exertional dyspnea     "sometimes" (09/15/2012)  . DM2 (diabetes mellitus, type 2)   . History of blood transfusion ?2011  . GERD (gastroesophageal reflux disease)     "once; came to hospital" (09/15/2012)  . OSA on CPAP     Past Surgical History  Procedure Laterality Date  . Cystectomy  1990's    off face  . Coronary angioplasty with stent placement  1999 / 2002 / 2004 / 2006?  Marland Kitchen Cardiac catheterization  2012  . Coronary artery bypass graft  2006    CABG X?; in [place]  . Cardiac catheterization  2013    Family History  Problem Relation Age of Onset  . Cancer Mother   .  Diabetes Father   . Heart disease Father   . Anemia Mother   . Heart attack Father   . Heart failure Father   . Hyperlipidemia Father   . Hypertension Father   . Kidney disease Mother   . Sudden death Father   . Kidney failure Sister 5  . Heart attack Sister   . Kidney disease Sister     History  Substance Use Topics  . Smoking status: Never Smoker   . Smokeless tobacco: Never Used     Comment: smoked some as a teenager (high school)  . Alcohol Use: No     Comment: 09/15/2012 "drank a little when I was young"      Review of Systems  Constitutional:       A complete 10 system review of systems was obtained and all systems are negative except as noted in  the HPI and PMH.    Allergies  Ciprocin-fluocin-procin; Clarithromycin; and Glimepiride  Home Medications   Current Outpatient Rx  Name  Route  Sig  Dispense  Refill  . acetaminophen (TYLENOL) 500 MG tablet   Oral   Take 1,000 mg by mouth 2 (two) times daily as needed. For pain         . amLODipine (NORVASC) 10 MG tablet   Oral   Take 1 tablet (10 mg total) by mouth daily.   90 tablet   2   . aspirin 325 MG tablet   Oral   Take 325 mg by mouth daily.          . carvedilol (COREG) 25 MG tablet   Oral   Take 37.5 mg by mouth 2 (two) times daily with a meal. 1 and 1/2 tablets (total 37.5mg ) two times a day         . gabapentin (NEURONTIN) 300 MG capsule   Oral   Take 1 capsule (300 mg total) by mouth 3 (three) times daily.   90 capsule   5   . hydrochlorothiazide (HYDRODIURIL) 25 MG tablet   Oral   Take 1 tablet (25 mg total) by mouth daily as needed. For blood pressure   90 tablet   3   . HYDROcodone-acetaminophen (VICODIN) 5-500 MG per tablet   Oral   Take 1 tablet by mouth every 8 (eight) hours as needed.   15 tablet   0   . insulin aspart (NOVOLOG) 100 UNIT/ML injection   Subcutaneous   Inject 9 Units into the skin 3 (three) times daily before meals.   1 vial   12   . insulin glargine (LANTUS) 100 UNIT/ML injection   Subcutaneous   Inject 28 Units into the skin every morning.   10 mL   3   . isosorbide mononitrate (IMDUR) 60 MG 24 hr tablet   Oral   Take 2 tablets (120 mg total) by mouth daily.   60 tablet   2   . losartan (COZAAR) 100 MG tablet   Oral   Take 0.5 tablets (50 mg total) by mouth daily.   45 tablet   3   . metFORMIN (GLUCOPHAGE) 500 MG tablet   Oral   Take 1 tablet (500 mg total) by mouth 2 (two) times daily with a meal.         . metFORMIN (GLUCOPHAGE) 500 MG tablet      TAKE ONE TABLET BY MOUTH TWICE DAILY   90 tablet   0   . nitroGLYCERIN (NITRODUR - DOSED IN MG/24  HR) 0.2 mg/hr   Transdermal   Place 1 patch  (0.2 mg total) onto the skin daily.   30 patch   12   . nitroGLYCERIN (NITROSTAT) 0.4 MG SL tablet   Sublingual   Place 1 tablet (0.4 mg total) under the tongue every 5 (five) minutes as needed.   100 tablet   1   . pantoprazole (PROTONIX) 40 MG tablet   Oral   Take 40 mg by mouth 2 (two) times daily with a meal.         . pravastatin (PRAVACHOL) 40 MG tablet   Oral   Take 1 tablet (40 mg total) by mouth every evening.   30 tablet   2   . ranolazine (RANEXA) 500 MG 12 hr tablet      Take one tablet daily. Take one tablet twice a day as tolerated.         . vitamin E 1000 UNIT capsule   Oral   Take 1,000 Units by mouth 2 (two) times daily.           Marland Kitchen zolpidem (AMBIEN) 10 MG tablet   Oral   Take 1 tablet (10 mg total) by mouth at bedtime as needed. For insomnia   30 tablet   0     BP 151/82  Temp(Src) 98.3 F (36.8 C) (Oral)  Resp 20  Ht 5\' 5"  (1.651 m)  Wt 225 lb (102.059 kg)  BMI 37.44 kg/m2  SpO2 97%  Physical Exam  Nursing note and vitals reviewed. Constitutional: He appears well-developed and well-nourished. No distress.  Awake, alert, nontoxic appearance  HENT:  Head: Atraumatic.  Eyes: Conjunctivae are normal. Right eye exhibits no discharge. Left eye exhibits no discharge.  Neck: Normal range of motion. Neck supple. No JVD present.  Cardiovascular: Normal rate and regular rhythm.   Pulmonary/Chest: Effort normal. No respiratory distress. He exhibits no tenderness.  Abdominal: Soft. There is no tenderness. There is no rebound.  Musculoskeletal: He exhibits no tenderness.  ROM appears intact, no obvious focal weakness  Neurological: He is alert.  Skin: Skin is warm and dry. No rash noted.  Psychiatric: He has a normal mood and affect.    ED Course  Procedures (including critical care time)  Labs Reviewed  CBC  BASIC METABOLIC PANEL  PRO B NATRIURETIC PEPTIDE    Date: 10/31/2012  Rate: 84  Rhythm: normal sinus rhythm  QRS Axis:  left  Intervals: normal  ST/T Wave abnormalities: nonspecific ST/T changes  Conduction Disutrbances:left anterior fascicular block  Narrative Interpretation:   Old EKG Reviewed: unchanged  Results for orders placed during the hospital encounter of 10/31/12  CBC      Result Value Range   WBC 6.7  4.0 - 10.5 K/uL   RBC 4.24  4.22 - 5.81 MIL/uL   Hemoglobin 12.2 (*) 13.0 - 17.0 g/dL   HCT 35.2 (*) 39.0 - 52.0 %   MCV 83.0  78.0 - 100.0 fL   MCH 28.8  26.0 - 34.0 pg   MCHC 34.7  30.0 - 36.0 g/dL   RDW 13.0  11.5 - 15.5 %   Platelets 196  150 - 400 K/uL  BASIC METABOLIC PANEL      Result Value Range   Sodium 138  135 - 145 mEq/L   Potassium 4.5  3.5 - 5.1 mEq/L   Chloride 105  96 - 112 mEq/L   CO2 20  19 - 32 mEq/L   Glucose, Bld 307 (*)  70 - 99 mg/dL   BUN 25 (*) 6 - 23 mg/dL   Creatinine, Ser 1.44 (*) 0.50 - 1.35 mg/dL   Calcium 9.4  8.4 - 10.5 mg/dL   GFR calc non Af Amer 54 (*) >90 mL/min   GFR calc Af Amer 63 (*) >90 mL/min  PRO B NATRIURETIC PEPTIDE      Result Value Range   Pro B Natriuretic peptide (BNP) 156.7 (*) 0 - 125 pg/mL  POCT I-STAT TROPONIN I      Result Value Range   Troponin i, poc 0.01  0.00 - 0.08 ng/mL   Comment 3            Dg Chest Port 1 View  10/31/2012  *RADIOLOGY REPORT*  Clinical Data: Chest pain.  PORTABLE CHEST - 1 VIEW  Comparison: None.  Findings: Low volume chest.  Median sternotomy.  Broken sternal wires appear unchanged. Monitoring leads are projected over the chest.  Cardiopericardial silhouette is mildly enlarged.  There appears to be coronary artery stent in the region of the left anterior descending or circumflex coronary artery.  The no focal consolidation.  No effusion.  IMPRESSION: Low volume chest without acute cardiopulmonary disease.   Original Report Authenticated By: Dereck Ligas, M.D.       10:16 AM Patient was seen and evaluated by me for his chest pain. He has angina type pain minimally relieved with aspirin and sublingual  nitroglycerin. He is currently having 8/10 non reproducible mid chest pain. He is hemodynamically stable and is alert and oriented at this time  10:19 AM Care discussed with attending.  Nitro drip started.  Work up initiated.  Will consult with cardiology for CP r/o.  11:10 AM i have consulted with Trish from [facility] who agrees to have a provider to see pt in ER and will plan further management.  Pt currently reports moderate improvement of his sxs while currently on nitro drip.    12:25 PM Pt has been seen and evaluated by cardiology who felt pt stable for discharge.  Sxs likely due to him not taking Ranexa.  Will give a dose of Ranexa here.  Case management also available and have talked to pt and offer assistance with medication.  Pt to f/u with cardiology on March 14th as previously scheduled.    BP 129/83  Pulse 79  Temp(Src) 98.3 F (36.8 C) (Oral)  Resp 18  Ht 5\' 5"  (1.651 m)  Wt 225 lb (102.059 kg)  BMI 37.44 kg/m2  SpO2 100%  I have reviewed nursing notes and vital signs. I personally reviewed the imaging tests through PACS system  I reviewed available ER/hospitalization records thought the EMR  CRITICAL CARE Performed by: Thresa Dozier   Total critical care time: 40 min  Critical care time was exclusive of separately billable procedures and treating other patients.  Critical care was necessary to treat or prevent imminent or life-threatening deterioration.  Critical care was time spent personally by me on the following activities: development of treatment plan with patient and/or surrogate as well as nursing, discussions with consultants, evaluation of patient's response to treatment, examination of patient, obtaining history from patient or surrogate, ordering and performing treatments and interventions, ordering and review of laboratory studies, ordering and review of radiographic studies, pulse oximetry and re-evaluation of patient's condition.  1. Angina  pectoris MDM          Domenic Moras, PA-C 10/31/12 1304

## 2012-10-31 NOTE — ED Notes (Signed)
Case Manager, PJ at bedside.

## 2012-10-31 NOTE — ED Provider Notes (Signed)
Medical screening examination/treatment/procedure(s) were conducted as a shared visit with non-physician practitioner(s) and myself.  I personally evaluated the patient during the encounter  Concerning for angina. Now noncompliant with medications. Will admit. [facility] cardiology to evaluate at bedside  Hoy Morn, MD 10/31/12 [phone]

## 2012-10-31 NOTE — ED Notes (Signed)
Spoke with pharmacy. Will send medication shortly.

## 2012-11-13 ENCOUNTER — Encounter: Payer: Self-pay | Admitting: Cardiology

## 2012-11-17 ENCOUNTER — Telehealth: Payer: Self-pay

## 2012-11-17 NOTE — Telephone Encounter (Signed)
I have already been in touch w/pt's pharmacy and ins today and was advised that Novolog is covered w/out prior auth but it is just the highest tier co-pay which is $45 for 1 mos and $135 for 3 mos. I completed form and faxed into insurance for a "Tier exception" to lower the tier for the pt and am waiting for a decision.  Notified pt on VM of status and that I will contact him when we receive notice of ins decision and can D/W Dr Lorelei Pont less expensive alternatives if needed at that time.

## 2012-11-17 NOTE — Telephone Encounter (Signed)
Prior authorization on medicines required  Also wants to talk with dr. Lorelei Pont in changing his meds due to insurance costs.  Questions on meds   Please call [phone]

## 2012-11-18 NOTE — Telephone Encounter (Signed)
Received a denial for tier exception to lower pt's co-pay. There are no preferred alternatives on a lower tier. When I spoke with the pharmacist he advised me that the closest alternative would be Novolin R which is not really comparable because it takes longer to act. Dr Lorelei Pont, I have put denial letter in your box. Do you want to talk with pt about changing his medications as pt requested in this phone message, or try to appeal the tier exception denial?

## 2012-11-19 NOTE — Telephone Encounter (Signed)
Called and lMOM- want to discuss this with him, will try back tomorrow

## 2012-11-20 NOTE — Telephone Encounter (Signed)
Looked through his chart- his last A1c was done at [facility] in January- was 11.7.  I have actually not seen him since July 2013.  He has been off of insulin since 2013.  We received a denial from [facility] which denied his request for a novolog flex pen.  Per my knowledge he had been on lantus, but he states he could not afford the lantus. I was not aware he was thinking of using novolog.  Asked him to please come in to clinic so we can check his A1c and discuss this complex issue.  He plans to see me next week.  He also had a question about his bill- asked him to please call the main number and speak to someone in billing as I do not have access to this information

## 2012-11-21 ENCOUNTER — Other Ambulatory Visit: Payer: Medicare PPO

## 2012-11-24 ENCOUNTER — Ambulatory Visit: Payer: Medicare PPO | Admitting: Cardiology

## 2012-11-26 ENCOUNTER — Other Ambulatory Visit: Payer: Self-pay | Admitting: Physician Assistant

## 2012-11-27 ENCOUNTER — Ambulatory Visit (INDEPENDENT_AMBULATORY_CARE_PROVIDER_SITE_OTHER): Payer: Medicare HMO | Admitting: Family Medicine

## 2012-11-27 VITALS — BP 150/92 | HR 77 | Temp 98.5°F | Resp 16 | Ht 65.5 in | Wt 227.0 lb

## 2012-11-27 DIAGNOSIS — E119 Type 2 diabetes mellitus without complications: Secondary | ICD-10-CM

## 2012-11-27 DIAGNOSIS — E7800 Pure hypercholesterolemia, unspecified: Secondary | ICD-10-CM

## 2012-11-27 LAB — GLUCOSE, POCT (MANUAL RESULT ENTRY): POC Glucose: 195 mg/dl — AB (ref 70–99)

## 2012-11-27 LAB — POCT GLYCOSYLATED HEMOGLOBIN (HGB A1C): Hemoglobin A1C: 8.8

## 2012-11-27 LAB — HEPATIC FUNCTION PANEL
Bilirubin, Direct: 0.1 mg/dL (ref 0.0–0.3)
Indirect Bilirubin: 0.4 mg/dL (ref 0.0–0.9)
Total Bilirubin: 0.5 mg/dL (ref 0.3–1.2)

## 2012-11-27 NOTE — Progress Notes (Signed)
[facility] [address] [place] [address] [phone]  Date:  11/27/2012   Name:  Matthew Rowe   DOB:  21-Feb-1961   MRN:  [id number]  PCP:  Lamar Blinks, MD    Chief Complaint: Medication Refill   History of Present Illness:  Matthew Rowe. is a 52 y.o. very pleasant male patient who presents with the following:  Here today to discuss his insulin/ DM treatment.  We had received paperwork regarding his insulin- his request for a novolog flex pen was denied.  He was not able to afford lantus either. He would prefer to use lantus if possible as he is more familiar with this insulin.   His cardiologist has started him on ranexa, which is helping with his CP.  This was brought from a tier 3 to a tier 2 when he appealed with [employer]- it is still pretty expensive but better  He is taking his metformin 500 BID- if glucose is over 200 he is taking an extra pill; so he is taking 2 or 3 metformin 500 mg pills daily.   His A1c in 09/15/12 was 11.7. He has actually gained weight since January, but is working on eating less bread.    Patient Active Problem List  Diagnosis  . Chest pain  . Renal failure, acute  . Dyslipidemia  . Coronary artery disease  . Acute MI, inferior wall  . Type 2 diabetes mellitus  . HTN (hypertension)  . Hypernatremia  . Unstable angina  . CKD (chronic kidney disease), stage III  . Polycystic kidney disease  . OSA on CPAP  . Angina pectoris    Past Medical History  Diagnosis Date  . Chest pain     Chronic  . CKD (chronic kidney disease)   . Dyslipidemia   . Coronary artery disease     a. s/p INF MI 1999=>PCI of RCA;  b.  prior extensive stenting of LAD with subsequent stent occlusion distally;  c.  s/p CABG with RIMA to RCA in 2006;  d.  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 Abnormal stress test     a. 11/11:  Myoview: apical infarct with ? small focus of inducible ischemia dInf wall, and Inf Apex, EF 46%;  b. Myoview 9/13:  mild apical scar with mild reversibility and mod inf scar with mod reverisibility, EF 47% => stable and med Rx cont'd   . Hypertension   . Polycystic kidney disease   . Hx of echocardiogram     a. Echo 3/12:  mild LVH, EF 55-60%, trivial AI  . Acute MI, inferior wall 1999  . Heart attack 2001; 2003; 2006  . Exertional dyspnea     "sometimes" (09/15/2012)  . DM2 (diabetes mellitus, type 2)   . History of blood transfusion ?2011  . GERD (gastroesophageal reflux disease)     "once; came to hospital" (09/15/2012)  . OSA on CPAP     Past Surgical History  Procedure Laterality Date  . Cystectomy  1990's    off face  . Coronary angioplasty with stent placement  1999 / 2002 / 2004 / 2006?  Marland Kitchen Cardiac catheterization  2012  . Coronary artery bypass graft  2006    CABG X?; in [place]  . Cardiac catheterization  2013    History  Substance Use Topics  . Smoking status: Never Smoker   . Smokeless tobacco: Never Used     Comment: smoked some as a teenager (high school)  . Alcohol Use: No     Comment: 09/15/2012 "drank a little when I was young"    Family History  Problem Relation Age of Onset  . Cancer Mother   . Anemia Mother   . Kidney disease Mother   . Diabetes Father   . Heart disease Father   . Heart attack Father   . Heart failure Father   . Hyperlipidemia Father   . Hypertension Father   . Sudden death Father   . Kidney failure Sister 5  . Heart attack Sister   . Kidney disease Sister     Allergies  Allergen Reactions  . Ciprocin-Fluocin-Procin (Fluocinolone Acetonide) Rash  . Clarithromycin Itching and Other (See Comments)    "Biaxin" Eyes itch and burn  . Glimepiride (Amaryl) Other (See Comments)    Elevates liver function     Medication list has been reviewed and updated.  Current Outpatient  Prescriptions on File Prior to Visit  Medication Sig Dispense Refill  . acetaminophen (TYLENOL) 500 MG tablet Take 1,000 mg by mouth 2 (two) times daily as needed. For pain      . amLODipine (NORVASC) 10 MG tablet Take 1 tablet (10 mg total) by mouth daily.  90 tablet  2  . aspirin 325 MG tablet Take 325 mg by mouth daily.       . carvedilol (COREG) 25 MG tablet Take 37.5 mg by mouth 2 (two) times daily with a meal. 1 and 1/2 tablets (total 37.5mg ) two times a day      . gabapentin (NEURONTIN) 300 MG capsule Take 1 capsule (300 mg total) by mouth 3 (three) times daily.  90 capsule  5  . hydrochlorothiazide (HYDRODIURIL) 25 MG tablet Take 1 tablet (25 mg total) by mouth daily as needed. For blood pressure  90 tablet  3  . HYDROcodone-acetaminophen (VICODIN) 5-500 MG per tablet Take 1 tablet by mouth every 8 (eight) hours as needed.  15 tablet  0  . isosorbide mononitrate (IMDUR) 60 MG 24 hr tablet Take 2 tablets (120 mg total) by mouth daily.  60 tablet  2  . metFORMIN (GLUCOPHAGE) 500 MG tablet Take 1 tablet (500 mg total) by mouth 2 (two) times daily with a meal.      . metFORMIN (GLUCOPHAGE) 500 MG tablet TAKE ONE TABLET BY MOUTH TWICE DAILY  180 tablet  0  . nitroGLYCERIN (NITRODUR - DOSED IN MG/24 HR) 0.2 mg/hr Place 1 patch (0.2 mg total) onto the skin daily.  30 patch  12  . nitroGLYCERIN (NITROSTAT) 0.4 MG SL tablet Place 1 tablet (0.4 mg total) under the tongue every 5 (five) minutes as needed.  100 tablet  1  . pantoprazole (PROTONIX) 40 MG tablet Take 40 mg by mouth 2 (two) times daily with a meal.      . pravastatin (PRAVACHOL) 40 MG tablet Take 1 tablet (40 mg total) by mouth every evening.  30 tablet  2  . ranolazine (RANEXA) 500 MG 12 hr tablet Take one tablet daily. Take one tablet twice a day as tolerated.      . vitamin E 1000 UNIT capsule Take 1,000 Units by mouth 2 (two) times daily.        Marland Kitchen zolpidem (AMBIEN) 10 MG tablet Take 1 tablet (10 mg  total) by mouth at bedtime as  needed. For insomnia  30 tablet  0  . losartan (COZAAR) 100 MG tablet Take 0.5 tablets (50 mg total) by mouth daily.  45 tablet  3   No current facility-administered medications on file prior to visit.    Review of Systems:  As per HPI- otherwise negative.   Physical Examination: Filed Vitals:   11/27/12 1244  BP: 150/92  Pulse: 77  Temp: 98.5 F (36.9 C)  Resp: 16   Filed Vitals:   11/27/12 1244  Height: 5' 5.5" (1.664 m)  Weight: 227 lb (102.967 kg)   Body mass index is 37.19 kg/(m^2). Ideal Body Weight: Weight in (lb) to have BMI = 25: 152.2  GEN: WDWN, NAD, Non-toxic, A & O x 3, obese HEENT: Atraumatic, Normocephalic. Neck supple. No masses, No LAD. Ears and Nose: No external deformity. CV: RRR, No M/G/R. No JVD. No thrill. No extra heart sounds. PULM: CTA B, no wheezes, crackles, rhonchi. No retractions. No resp. distress. No accessory muscle use. EXTR: No c/c/e NEURO Normal gait.  PSYCH: Normally interactive. Conversant. Not depressed or anxious appearing.  Calm demeanor.   Results for orders placed in visit on 11/27/12  POCT GLYCOSYLATED HEMOGLOBIN (HGB A1C)      Result Value Range   Hemoglobin A1C 8.8    GLUCOSE, POCT (MANUAL RESULT ENTRY)      Result Value Range   POC Glucose 195 (*) 70 - 99 mg/dl     Assessment and Plan: Diabetes - Plan: POCT glycosylated hemoglobin (Hb A1C), POCT glucose (manual entry), Hepatic Function Panel  High cholesterol - Plan: Hepatic Function Panel  Regniald's A1c is actually much improved today.  Still would like to have his A1c below 7, but we are encouraged by his progress.  Called [employer] today and obtained paperwork to do a tier appeal for lantus insulin.  Will plan to start back on this assuming we can get the tier lowered to an affordable rate  Signed Lamar Blinks, MD

## 2012-11-27 NOTE — Patient Instructions (Addendum)
I will work on getting a tier exemption for your lantus pens.  As soon as I have this set up I will let you know. Please call [employer] and try to find out the cost difference for you for a tier 2 vs a tier 1 medicine.  Please sign up for mychart- this will let you communicate via email which will be easier for you.

## 2012-11-28 ENCOUNTER — Encounter: Payer: Self-pay | Admitting: Family Medicine

## 2012-12-01 ENCOUNTER — Telehealth: Payer: Self-pay

## 2012-12-01 ENCOUNTER — Other Ambulatory Visit: Payer: Self-pay | Admitting: Family Medicine

## 2012-12-01 DIAGNOSIS — E118 Type 2 diabetes mellitus with unspecified complications: Secondary | ICD-10-CM

## 2012-12-01 DIAGNOSIS — I10 Essential (primary) hypertension: Secondary | ICD-10-CM

## 2012-12-01 DIAGNOSIS — E139 Other specified diabetes mellitus without complications: Secondary | ICD-10-CM

## 2012-12-01 MED ORDER — PANTOPRAZOLE SODIUM 40 MG PO TBEC: 40.0000 mg | DELAYED_RELEASE_TABLET | Freq: Two times a day (BID) | ORAL | Status: DC

## 2012-12-01 MED ORDER — METFORMIN HCL 500 MG PO TABS: 500.0000 mg | ORAL_TABLET | Freq: Two times a day (BID) | ORAL | Status: DC

## 2012-12-01 MED ORDER — HYDROCHLOROTHIAZIDE 25 MG PO TABS: 25.0000 mg | ORAL_TABLET | Freq: Every day | ORAL | Status: DC | PRN

## 2012-12-01 MED ORDER — ISOSORBIDE MONONITRATE ER 60 MG PO TB24: 120.0000 mg | ORAL_TABLET | Freq: Every day | ORAL | Status: DC

## 2012-12-01 MED ORDER — LOSARTAN POTASSIUM 100 MG PO TABS: 50.0000 mg | ORAL_TABLET | Freq: Every day | ORAL | Status: DC

## 2012-12-01 NOTE — Telephone Encounter (Signed)
Pt is upset his medications are not ready at [facility]. He states he has no refills and that is the reason he came in on the 20th to get all of his meds refilled.  Not very happy.  He would like to talk with dr. Lorelei Pont.  [phone]

## 2012-12-01 NOTE — Telephone Encounter (Signed)
Sending in Rxs for losartan, glucophage, indur, protonix and HCTZ, each for a total of 6 mos of RFs. Contacted pt and explained what was done. He thanked [place] and stated he will get back in touch about the Rxs written by cardiologist at another time.  Dr Lorelei Pont, do you want to send in the Rx for the Solostar pens (and pen needles?)?

## 2012-12-01 NOTE — Telephone Encounter (Signed)
Called pt to give him info about his cost for Lantus Solostar pens, $45 co-pay. Pt stated that we can send in Rx but he won't be able to p/up until the first of the month. He was frustrated that none of his Rxs had been RFd when he was in for his OV. I explained that some are Rxd by his cardiologist and that we had just sent in Rxs for a couple of his meds. Pt thought that all of them should be sent in a time of visit. After speaking w/Dr Copland, I checked w/pharmacy and asked which Rxs are low on RFs and will send in 6 mos of RFs on these. Asked pharmacy to request RFs from cardiologist for any meds Rxd by them that are out of RFs.

## 2012-12-02 MED ORDER — INSULIN PEN NEEDLE 31G X 5 MM MISC: Status: DC

## 2012-12-02 MED ORDER — INSULIN GLARGINE 100 UNIT/ML ~~LOC~~ SOLN: 10.0000 [IU] | Freq: Every day | SUBCUTANEOUS | Status: DC

## 2012-12-02 NOTE — Telephone Encounter (Signed)
Sent over rx for lantus and the pen needles today.  Called Mr. Goad and went over his lantus instructions- will start with 10 units at first.  Apologized that we did not fill his medications at his visit on 3/20- explained that I did not know that he needed refills, and that it can be confusing for everyone when a patient has different doctors prescribing medications.  Asked him to please have his pharmacy send a refill request when he notices he is out of refills- this will ensure the request goes to the right doctor's office and that his medications are refilled on time.

## 2012-12-02 NOTE — Telephone Encounter (Signed)
See notes under phone message 12/01/12.

## 2012-12-11 ENCOUNTER — Telehealth: Payer: Self-pay

## 2012-12-11 DIAGNOSIS — I25810 Atherosclerosis of coronary artery bypass graft(s) without angina pectoris: Secondary | ICD-10-CM

## 2012-12-11 DIAGNOSIS — I10 Essential (primary) hypertension: Secondary | ICD-10-CM

## 2012-12-11 DIAGNOSIS — E1140 Type 2 diabetes mellitus with diabetic neuropathy, unspecified: Secondary | ICD-10-CM

## 2012-12-11 NOTE — Telephone Encounter (Signed)
Changing pharmacy to [facility] [employer] WILL CALL FOR AUTHORIZATION AND CHANGE OF LOCATION FOR FILLING PRESCRIPTIONS.  [phone]

## 2012-12-12 MED ORDER — BLOOD GLUCOSE MONITORING SUPPL DEVI: Status: DC

## 2012-12-12 MED ORDER — GABAPENTIN 300 MG PO CAPS: 300.0000 mg | ORAL_CAPSULE | Freq: Three times a day (TID) | ORAL | Status: DC

## 2012-12-12 MED ORDER — LANCETS MISC: Status: DC

## 2012-12-12 MED ORDER — ALCOHOL SWABS PADS: MEDICATED_PAD | Status: DC

## 2012-12-12 MED ORDER — AMLODIPINE BESYLATE 10 MG PO TABS: 10.0000 mg | ORAL_TABLET | Freq: Every day | ORAL | Status: DC

## 2012-12-12 MED ORDER — GLUCOSE BLOOD VI STRP: ORAL_STRIP | Status: DC

## 2012-12-12 MED ORDER — HYDROCHLOROTHIAZIDE 25 MG PO TABS: 25.0000 mg | ORAL_TABLET | Freq: Every day | ORAL | Status: DC | PRN

## 2012-12-12 MED ORDER — PANTOPRAZOLE SODIUM 40 MG PO TBEC: 40.0000 mg | DELAYED_RELEASE_TABLET | Freq: Two times a day (BID) | ORAL | Status: DC

## 2012-12-12 MED ORDER — PRAVASTATIN SODIUM 40 MG PO TABS: 40.0000 mg | ORAL_TABLET | Freq: Every evening | ORAL | Status: DC

## 2012-12-12 NOTE — Telephone Encounter (Addendum)
Received requests for several Rxs for pt from [facility]. Sending in the [place], except for nitrostat which is Rxd by cardiologist. Dr Lorelei Pont Ok'd the rest for 6 mos, DM supplies x 1 year.  Dr Lorelei Pont, [facility] is also requesting Rx for hydrocodone. Please advise.

## 2012-12-12 NOTE — Telephone Encounter (Signed)
pharmacy changed. Matthew Rowe

## 2012-12-12 NOTE — Addendum Note (Signed)
Addended by: Elwyn Reach A on: 12/12/2012 04:48 PM   Modules accepted: Orders

## 2012-12-12 NOTE — Telephone Encounter (Signed)
Dr Lorelei Pont denied RF of hydrocodone to [facility]. Faxed in all other Rxs w/confirmation.

## 2012-12-15 ENCOUNTER — Emergency Department (HOSPITAL_COMMUNITY): Payer: Medicare PPO

## 2012-12-15 ENCOUNTER — Encounter (HOSPITAL_COMMUNITY): Payer: Self-pay

## 2012-12-15 ENCOUNTER — Inpatient Hospital Stay (HOSPITAL_COMMUNITY)
Admission: EM | Admit: 2012-12-15 | Discharge: 2012-12-16 | DRG: 303 | Disposition: A | Discharge status: Home / Self Care | Payer: Medicare PPO | Attending: Family Medicine | Admitting: Family Medicine

## 2012-12-15 DIAGNOSIS — Z9861 Coronary angioplasty status: Secondary | ICD-10-CM

## 2012-12-15 DIAGNOSIS — Z951 Presence of aortocoronary bypass graft: Secondary | ICD-10-CM

## 2012-12-15 DIAGNOSIS — Z794 Long term (current) use of insulin: Secondary | ICD-10-CM

## 2012-12-15 DIAGNOSIS — Z7982 Long term (current) use of aspirin: Secondary | ICD-10-CM

## 2012-12-15 DIAGNOSIS — G4733 Obstructive sleep apnea (adult) (pediatric): Secondary | ICD-10-CM | POA: Diagnosis present

## 2012-12-15 DIAGNOSIS — Z79899 Other long term (current) drug therapy: Secondary | ICD-10-CM

## 2012-12-15 DIAGNOSIS — K219 Gastro-esophageal reflux disease without esophagitis: Secondary | ICD-10-CM | POA: Diagnosis present

## 2012-12-15 DIAGNOSIS — I200 Unstable angina: Secondary | ICD-10-CM

## 2012-12-15 DIAGNOSIS — L02411 Cutaneous abscess of right axilla: Secondary | ICD-10-CM

## 2012-12-15 DIAGNOSIS — I209 Angina pectoris, unspecified: Secondary | ICD-10-CM | POA: Diagnosis present

## 2012-12-15 DIAGNOSIS — I10 Essential (primary) hypertension: Secondary | ICD-10-CM | POA: Diagnosis present

## 2012-12-15 DIAGNOSIS — I252 Old myocardial infarction: Secondary | ICD-10-CM

## 2012-12-15 DIAGNOSIS — N183 Chronic kidney disease, stage 3 unspecified: Secondary | ICD-10-CM | POA: Diagnosis present

## 2012-12-15 DIAGNOSIS — I2510 Atherosclerotic heart disease of native coronary artery without angina pectoris: Principal | ICD-10-CM | POA: Diagnosis present

## 2012-12-15 DIAGNOSIS — R739 Hyperglycemia, unspecified: Secondary | ICD-10-CM

## 2012-12-15 DIAGNOSIS — N289 Disorder of kidney and ureter, unspecified: Secondary | ICD-10-CM | POA: Diagnosis present

## 2012-12-15 DIAGNOSIS — IMO0002 Reserved for concepts with insufficient information to code with codable children: Secondary | ICD-10-CM | POA: Diagnosis present

## 2012-12-15 DIAGNOSIS — E119 Type 2 diabetes mellitus without complications: Secondary | ICD-10-CM | POA: Diagnosis present

## 2012-12-15 DIAGNOSIS — I129 Hypertensive chronic kidney disease with stage 1 through stage 4 chronic kidney disease, or unspecified chronic kidney disease: Secondary | ICD-10-CM | POA: Diagnosis present

## 2012-12-15 LAB — CBC
Hemoglobin: 13.1 g/dL (ref 13.0–17.0)
MCV: 81.2 fL (ref 78.0–100.0)
Platelets: 202 10*3/uL (ref 150–400)
RBC: 4.53 MIL/uL (ref 4.22–5.81)
WBC: 8.7 10*3/uL (ref 4.0–10.5)

## 2012-12-15 LAB — BASIC METABOLIC PANEL
CO2: 23 mEq/L (ref 19–32)
Chloride: 102 mEq/L (ref 96–112)
Creatinine, Ser: 1.62 mg/dL — ABNORMAL HIGH (ref 0.50–1.35)
Sodium: 135 mEq/L (ref 135–145)

## 2012-12-15 LAB — POCT I-STAT TROPONIN I: Troponin i, poc: 0.01 ng/mL (ref 0.00–0.08)

## 2012-12-15 LAB — GLUCOSE, CAPILLARY: Glucose-Capillary: 376 mg/dL — ABNORMAL HIGH (ref 70–99)

## 2012-12-15 MED ORDER — SODIUM CHLORIDE 0.9 % IV SOLN
INTRAVENOUS | Status: DC
  Administered 2012-12-16: 02:00:00 via INTRAVENOUS

## 2012-12-15 MED ORDER — ONDANSETRON HCL 4 MG/2ML IJ SOLN
4.0000 mg | Freq: Once | INTRAMUSCULAR | Status: AC
  Administered 2012-12-15: 4 mg via INTRAVENOUS
  Filled 2012-12-15: qty 2

## 2012-12-15 MED ORDER — NITROGLYCERIN 0.4 MG SL SUBL
0.4000 mg | SUBLINGUAL_TABLET | SUBLINGUAL | Status: DC | PRN
  Administered 2012-12-16: 0.4 mg via SUBLINGUAL
  Filled 2012-12-15: qty 25

## 2012-12-15 MED ORDER — MORPHINE SULFATE 4 MG/ML IJ SOLN
4.0000 mg | Freq: Once | INTRAMUSCULAR | Status: AC
  Administered 2012-12-15: 4 mg via INTRAVENOUS
  Filled 2012-12-15: qty 1

## 2012-12-15 NOTE — ED Notes (Signed)
Patient presents with c/o left chest pain that radiates to left arm. Reports 1 episode yesterday while sitting on the couch, lasted 1-2 hours, relieved by pain medication (tylenol, ASA, neurontin, nitro x 3). Pain returned this morning, woke up from sleep, lasted 2 hours, relieved by pain medication (tylenol, neurontin, nitro x 3). Endorses some dizziness. Denies SOB, headaches, abd pain, N/V.   Patient also c/o hyperglycemia. Reports checking CBG around 8 pm and it read "HIGH" which means it's over 600. Denies sweats or chills. Endorses polyuria and increase thirst.

## 2012-12-15 NOTE — ED Provider Notes (Signed)
History     CSN: [id number]  Arrival date & time 12/15/12  2004   First MD Initiated Contact with Patient 12/15/12 2329      Chief Complaint  Patient presents with  . Chest Pain  . Hyperglycemia    (Consider location/radiation/quality/duration/timing/severity/associated sxs/prior treatment) HPI History provided by patient. History of coronary artery disease with stent placed in 2006. Followed by cardiology. Yesterday developed chest pain relieved by nitroglycerin. Pain returned today at rest. Since being in the waiting room has had some repeat chest pain as well.  Pain is pressure like radiates to left arm. Mild shortness of breath. No leg pain or leg swelling. No cough or fevers. Symptoms moderate in severity, seemed to be worse with exertion and walking. Past Medical History  Diagnosis Date  . Chest pain     Chronic  . CKD (chronic kidney disease)   . Dyslipidemia   . Coronary artery disease     a. s/p INF MI 1999=>PCI of RCA;  b.  prior extensive stenting of LAD with subsequent stent occlusion distally;  c.  s/p CABG with RIMA to RCA in 2006;  d.  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 Abnormal stress test     a. 11/11:  Myoview: apical infarct with ? small focus of inducible ischemia dInf wall, and Inf Apex, EF 46%;  b. Myoview 9/13:  mild apical scar with mild reversibility and mod inf scar with mod reverisibility, EF 47% => stable and med Rx cont'd   . Hypertension   . Polycystic kidney disease   . Hx of echocardiogram     a. Echo 3/12:  mild LVH, EF 55-60%, trivial AI  . Acute MI, inferior wall 1999  . Heart attack 2001; 2003; 2006  . Exertional dyspnea     "sometimes" (09/15/2012)  . DM2 (diabetes mellitus, type 2)   . History of blood transfusion ?2011  . GERD (gastroesophageal reflux disease)     "once; came to hospital"  (09/15/2012)  . OSA on CPAP     Past Surgical History  Procedure Laterality Date  . Cystectomy  1990's    off face  . Coronary angioplasty with stent placement  1999 / 2002 / 2004 / 2006?  Marland Kitchen Cardiac catheterization  2012  . Coronary artery bypass graft  2006    CABG X?; in [place]  . Cardiac catheterization  2013    Family History  Problem Relation Age of Onset  . Cancer Mother   . Anemia Mother   . Kidney disease Mother   . Diabetes Father   . Heart disease Father   . Heart attack Father   . Heart failure Father   . Hyperlipidemia Father   . Hypertension Father   . Sudden death Father   . Kidney failure Sister 5  . Heart attack Sister   . Kidney disease Sister     History  Substance Use Topics  . Smoking status: Never Smoker   . Smokeless tobacco: Never Used     Comment: smoked some as a teenager (high school)  . Alcohol Use: No     Comment: 09/15/2012 "drank a little when I was young"      Review of Systems  Constitutional: Negative for fever and chills.  HENT: Negative for neck pain and neck stiffness.  Eyes: Negative for pain.  Respiratory: Negative for cough.   Cardiovascular: Positive for chest pain.  Gastrointestinal: Negative for vomiting and abdominal pain.  Genitourinary: Negative for dysuria.  Musculoskeletal: Negative for back pain.  Skin: Negative for rash.  Neurological: Negative for headaches.  All other systems reviewed and are negative.    Allergies  Ciprocin-fluocin-procin; Clarithromycin; and Glimepiride  Home Medications   Current Outpatient Rx  Name  Route  Sig  Dispense  Refill  . acetaminophen (TYLENOL) 500 MG tablet   Oral   Take 1,000 mg by mouth 2 (two) times daily as needed. For pain         . Alcohol Swabs PADS      Use to test blood sugar 3 times daily. Dx code 250.00   300 each   1   . amLODipine (NORVASC) 10 MG tablet   Oral   Take 1 tablet (10 mg total) by mouth daily.   90 tablet   1   . aspirin 325 MG  tablet   Oral   Take 325 mg by mouth daily.          . Blood Glucose Monitoring Suppl DEVI      Use to test blood sugar 3 times daily. Dx code 250.00.   1 each   0     PRODIGY AUTOCODE METER KIT   . carvedilol (COREG) 25 MG tablet   Oral   Take 37.5 mg by mouth 2 (two) times daily with a meal. 1 and 1/2 tablets (total 37.5mg ) two times a day         . gabapentin (NEURONTIN) 300 MG capsule   Oral   Take 1 capsule (300 mg total) by mouth 3 (three) times daily.   270 capsule   1   . glucose blood test strip      Use to test blood sugar 3 times daily. Dx code: 250.00   300 each   1     PRODIGY NO CODING STRIPS   . hydrochlorothiazide (HYDRODIURIL) 25 MG tablet   Oral   Take 1 tablet (25 mg total) by mouth daily as needed. For blood pressure   90 tablet   1   . HYDROcodone-acetaminophen (VICODIN) 5-500 MG per tablet   Oral   Take 1 tablet by mouth every 8 (eight) hours as needed.   15 tablet   0   . insulin glargine (LANTUS) 100 UNIT/ML injection   Subcutaneous   Inject 0.1 mLs (10 Units total) into the skin daily.   15 mL   12   . Insulin Pen Needle 31G X 5 MM MISC      Use one daily for insulin injection   100 each   6   . isosorbide mononitrate (IMDUR) 60 MG 24 hr tablet   Oral   Take 2 tablets (120 mg total) by mouth daily.   60 tablet   5   . Lancets MISC      Use to test blood sugar 3 times daily. Dx code: 250.00.   300 each   1     PRODIGY TWIST TOP 28G   . losartan (COZAAR) 100 MG tablet   Oral   Take 0.5 tablets (50 mg total) by mouth daily.   45 tablet   1   . metFORMIN (GLUCOPHAGE) 500 MG tablet      TAKE ONE TABLET BY MOUTH TWICE DAILY   180 tablet   0   . metFORMIN (GLUCOPHAGE)  500 MG tablet   Oral   Take 1 tablet (500 mg total) by mouth 2 (two) times daily with a meal.   180 tablet   1   . nitroGLYCERIN (NITRODUR - DOSED IN MG/24 HR) 0.2 mg/hr   Transdermal   Place 1 patch (0.2 mg total) onto the skin daily.   30  patch   12   . nitroGLYCERIN (NITROSTAT) 0.4 MG SL tablet   Sublingual   Place 1 tablet (0.4 mg total) under the tongue every 5 (five) minutes as needed.   100 tablet   1   . pantoprazole (PROTONIX) 40 MG tablet   Oral   Take 1 tablet (40 mg total) by mouth 2 (two) times daily with a meal.   180 tablet   1   . pravastatin (PRAVACHOL) 40 MG tablet   Oral   Take 1 tablet (40 mg total) by mouth every evening.   90 tablet   1   . ranolazine (RANEXA) 500 MG 12 hr tablet      Take one tablet daily. Take one tablet twice a day as tolerated.         . vitamin E 1000 UNIT capsule   Oral   Take 1,000 Units by mouth 2 (two) times daily.           Marland Kitchen zolpidem (AMBIEN) 10 MG tablet   Oral   Take 1 tablet (10 mg total) by mouth at bedtime as needed. For insomnia   30 tablet   0     BP 151/85  Pulse 83  Temp(Src) 98.3 F (36.8 C) (Oral)  Resp 16  SpO2 97%  Physical Exam  Constitutional: He is oriented to person, place, and time. He appears well-developed and well-nourished.  HENT:  Head: Normocephalic and atraumatic.  Eyes: Conjunctivae and EOM are normal. Pupils are equal, round, and reactive to light.  Neck: Trachea normal. Neck supple. No thyromegaly present.  Cardiovascular: Normal rate, regular rhythm, S1 normal, S2 normal and normal pulses.     No systolic murmur is present   No diastolic murmur is present  Pulses:      Radial pulses are 2+ on the right side, and 2+ on the left side.  Pulmonary/Chest: Effort normal and breath sounds normal. He has no wheezes. He has no rhonchi. He has no rales. He exhibits no tenderness.  Abdominal: Soft. Normal appearance and bowel sounds are normal. There is no tenderness. There is no CVA tenderness and negative Murphy's sign.  Musculoskeletal:  calves nontender, no cords or erythema, negative Homans sign Right axilla with area of tenderness and fluctuance with pointing. No erythema or surrounding cellulitis.  Neurological: He  is alert and oriented to person, place, and time. He has normal strength. No cranial nerve deficit or sensory deficit. GCS eye subscore is 4. GCS verbal subscore is 5. GCS motor subscore is 6.  Skin: Skin is warm and dry. No rash noted. He is not diaphoretic.  Psychiatric: His speech is normal.  Cooperative and appropriate    ED Course  INCISION AND DRAINAGE Date/Time: 12/16/2012 12:07 AM Performed by: Teressa Lower Authorized by: Teressa Lower Consent: Verbal consent obtained. Risks and benefits: risks, benefits and alternatives were discussed Consent given by: patient Patient understanding: patient states understanding of the procedure being performed Patient consent: the patient's understanding of the procedure matches consent given Procedure consent: procedure consent matches procedure scheduled Required items: required blood products, implants, devices, and special equipment available Patient identity  confirmed: verbally with patient Type: abscess Body area: upper extremity (Right axilla) Anesthesia: local infiltration Local anesthetic: lidocaine 1% with epinephrine Anesthetic total: 2 ml Risk factor: underlying major vessel Scalpel size: 11 Needle gauge: 20 Incision type: single straight Complexity: simple Drainage: serosanguinous Wound treatment: wound left open Patient tolerance: Patient tolerated the procedure well with no immediate complications.   (including critical care time)  Results for orders placed during the hospital encounter of 12/15/12  CBC      Result Value Range   WBC 8.7  4.0 - 10.5 K/uL   RBC 4.53  4.22 - 5.81 MIL/uL   Hemoglobin 13.1  13.0 - 17.0 g/dL   HCT 36.8 (*) 39.0 - 52.0 %   MCV 81.2  78.0 - 100.0 fL   MCH 28.9  26.0 - 34.0 pg   MCHC 35.6  30.0 - 36.0 g/dL   RDW 12.7  11.5 - 15.5 %   Platelets 202  150 - 400 K/uL  BASIC METABOLIC PANEL      Result Value Range   Sodium 135  135 - 145 mEq/L   Potassium 4.8  3.5 - 5.1 mEq/L   Chloride 102   96 - 112 mEq/L   CO2 23  19 - 32 mEq/L   Glucose, Bld 356 (*) 70 - 99 mg/dL   BUN 24 (*) 6 - 23 mg/dL   Creatinine, Ser 1.62 (*) 0.50 - 1.35 mg/dL   Calcium 9.5  8.4 - 10.5 mg/dL   GFR calc non Af Amer 47 (*) >90 mL/min   GFR calc Af Amer 55 (*) >90 mL/min  GLUCOSE, CAPILLARY      Result Value Range   Glucose-Capillary 376 (*) 70 - 99 mg/dL  POCT I-STAT TROPONIN I      Result Value Range   Troponin i, poc 0.01  0.00 - 0.08 ng/mL   Comment 3            Dg Chest 2 View  12/15/2012  *RADIOLOGY REPORT*  Clinical Data: Chest pain and hyperglycemia.  CHEST - 2 VIEW  Comparison: Chest radiograph performed 10/31/2012  Findings: The lungs are mildly hypoexpanded; mild bibasilar atelectasis is noted.  There is no evidence of pleural effusion or pneumothorax.  The heart is normal in size; the patient is status post median sternotomy.  No acute osseous abnormalities are seen.  IMPRESSION: Lungs mildly hypoexpanded; mild bibasilar atelectasis noted.  No significant focal airspace consolidation seen.   Original Report Authenticated By: Santa Lighter, M.D.      Date: 12/15/2012  Rate: 87  Rhythm: normal sinus rhythm  QRS Axis: left  Intervals: normal  ST/T Wave abnormalities: nonspecific ST changes  Conduction Disutrbances:none  Narrative Interpretation:   Old EKG Reviewed: unchanged  11:35 PM d/w FPM will admit  MDM  Chest pain concerning for unstable angina  EKG. Labs. Chest x-ray.  Aspirin. Nitroglycerin. Morphine.  Medical admission        Teressa Lower, MD 12/16/12 2300

## 2012-12-15 NOTE — ED Notes (Signed)
Labs, xrays & VS reviewed with regard to hx (nurse first).

## 2012-12-16 ENCOUNTER — Telehealth: Payer: Self-pay | Admitting: Radiology

## 2012-12-16 ENCOUNTER — Encounter (HOSPITAL_COMMUNITY): Payer: Self-pay | Admitting: Family Medicine

## 2012-12-16 ENCOUNTER — Other Ambulatory Visit: Payer: Self-pay | Admitting: Family Medicine

## 2012-12-16 DIAGNOSIS — I200 Unstable angina: Secondary | ICD-10-CM

## 2012-12-16 DIAGNOSIS — I209 Angina pectoris, unspecified: Secondary | ICD-10-CM

## 2012-12-16 DIAGNOSIS — IMO0002 Reserved for concepts with insufficient information to code with codable children: Secondary | ICD-10-CM

## 2012-12-16 LAB — GLUCOSE, CAPILLARY: Glucose-Capillary: 259 mg/dL — ABNORMAL HIGH (ref 70–99)

## 2012-12-16 LAB — MRSA PCR SCREENING: MRSA by PCR: NEGATIVE

## 2012-12-16 LAB — TROPONIN I
Troponin I: <0.3 ng/mL (ref <0.30)
Troponin I: <0.3 ng/mL (ref <0.30)

## 2012-12-16 MED ORDER — CARVEDILOL 25 MG PO TABS
37.5000 mg | ORAL_TABLET | Freq: Two times a day (BID) | ORAL | Status: DC
  Filled 2012-12-16 (×2): qty 1

## 2012-12-16 MED ORDER — ACETAMINOPHEN 325 MG PO TABS
650.0000 mg | ORAL_TABLET | Freq: Four times a day (QID) | ORAL | Status: DC | PRN
  Administered 2012-12-16: 650 mg via ORAL
  Filled 2012-12-16: qty 2

## 2012-12-16 MED ORDER — NITROGLYCERIN IN D5W 200-5 MCG/ML-% IV SOLN
2.0000 ug/min | INTRAVENOUS | Status: DC
  Administered 2012-12-16: 10 ug/min via INTRAVENOUS

## 2012-12-16 MED ORDER — ISOSORBIDE MONONITRATE ER 60 MG PO TB24
120.0000 mg | ORAL_TABLET | Freq: Every day | ORAL | Status: DC
  Administered 2012-12-16: 120 mg via ORAL
  Filled 2012-12-16: qty 2

## 2012-12-16 MED ORDER — HYDROCODONE-ACETAMINOPHEN 5-325 MG PO TABS
2.0000 | ORAL_TABLET | Freq: Four times a day (QID) | ORAL | Status: DC | PRN
  Filled 2012-12-16: qty 2

## 2012-12-16 MED ORDER — LOSARTAN POTASSIUM 50 MG PO TABS
50.0000 mg | ORAL_TABLET | Freq: Every day | ORAL | Status: DC
  Administered 2012-12-16: 50 mg via ORAL
  Filled 2012-12-16: qty 1

## 2012-12-16 MED ORDER — RANOLAZINE ER 500 MG PO TB12
1000.0000 mg | ORAL_TABLET | Freq: Two times a day (BID) | ORAL | Status: DC
  Administered 2012-12-16: 1000 mg via ORAL
  Filled 2012-12-16 (×2): qty 2

## 2012-12-16 MED ORDER — INSULIN ASPART 100 UNIT/ML ~~LOC~~ SOLN
0.0000 [IU] | Freq: Three times a day (TID) | SUBCUTANEOUS | Status: DC
  Administered 2012-12-16: 11 [IU] via SUBCUTANEOUS

## 2012-12-16 MED ORDER — SIMVASTATIN 10 MG PO TABS
10.0000 mg | ORAL_TABLET | Freq: Every day | ORAL | Status: DC
  Filled 2012-12-16: qty 1

## 2012-12-16 MED ORDER — ASPIRIN EC 325 MG PO TBEC
325.0000 mg | DELAYED_RELEASE_TABLET | Freq: Every day | ORAL | Status: DC
  Filled 2012-12-16: qty 1

## 2012-12-16 MED ORDER — LOSARTAN POTASSIUM 50 MG PO TABS: 50.0000 mg | ORAL_TABLET | Freq: Every day | ORAL | Status: DC

## 2012-12-16 MED ORDER — HYDROCODONE-ACETAMINOPHEN 5-325 MG PO TABS: 2.0000 | ORAL_TABLET | Freq: Four times a day (QID) | ORAL | Status: DC | PRN

## 2012-12-16 MED ORDER — SODIUM CHLORIDE 0.9 % IJ SOLN
3.0000 mL | Freq: Two times a day (BID) | INTRAMUSCULAR | Status: DC
  Administered 2012-12-16 (×2): 3 mL via INTRAVENOUS

## 2012-12-16 MED ORDER — NITROGLYCERIN IN D5W 200-5 MCG/ML-% IV SOLN
INTRAVENOUS | Status: AC
  Administered 2012-12-16: 5 ug via INTRAVENOUS
  Filled 2012-12-16: qty 250

## 2012-12-16 MED ORDER — NITROGLYCERIN 0.4 MG SL SUBL: 0.4000 mg | SUBLINGUAL_TABLET | SUBLINGUAL | Status: DC | PRN

## 2012-12-16 MED ORDER — ASPIRIN 81 MG PO CHEW
324.0000 mg | CHEWABLE_TABLET | Freq: Once | ORAL | Status: AC
Start: 2012-12-16 — End: 2012-12-16
  Administered 2012-12-16: 324 mg via ORAL
  Filled 2012-12-16: qty 4

## 2012-12-16 MED ORDER — INSULIN ASPART 100 UNIT/ML ~~LOC~~ SOLN
0.0000 [IU] | Freq: Every day | SUBCUTANEOUS | Status: DC
  Administered 2012-12-16: 3 [IU] via SUBCUTANEOUS

## 2012-12-16 MED ORDER — HEPARIN SODIUM (PORCINE) 5000 UNIT/ML IJ SOLN
5000.0000 [IU] | Freq: Three times a day (TID) | INTRAMUSCULAR | Status: DC
  Filled 2012-12-16 (×4): qty 1

## 2012-12-16 MED ORDER — HYDROCHLOROTHIAZIDE 25 MG PO TABS
25.0000 mg | ORAL_TABLET | Freq: Every day | ORAL | Status: DC
  Administered 2012-12-16: 25 mg via ORAL
  Filled 2012-12-16: qty 1

## 2012-12-16 MED ORDER — ASPIRIN 325 MG PO TABS: 325.0000 mg | ORAL_TABLET | Freq: Every day | ORAL | Status: DC

## 2012-12-16 MED ORDER — MORPHINE SULFATE 2 MG/ML IJ SOLN
2.0000 mg | INTRAMUSCULAR | Status: DC | PRN
  Administered 2012-12-16 (×2): 2 mg via INTRAVENOUS
  Filled 2012-12-16 (×2): qty 1

## 2012-12-16 MED ORDER — GABAPENTIN 300 MG PO CAPS
300.0000 mg | ORAL_CAPSULE | Freq: Three times a day (TID) | ORAL | Status: DC
  Administered 2012-12-16: 300 mg via ORAL
  Filled 2012-12-16 (×3): qty 1

## 2012-12-16 MED ORDER — AMLODIPINE BESYLATE 10 MG PO TABS
10.0000 mg | ORAL_TABLET | Freq: Every day | ORAL | Status: DC
  Filled 2012-12-16: qty 1

## 2012-12-16 MED ORDER — PANTOPRAZOLE SODIUM 40 MG PO TBEC
40.0000 mg | DELAYED_RELEASE_TABLET | Freq: Two times a day (BID) | ORAL | Status: DC
Start: 2012-12-16 — End: 2012-12-16

## 2012-12-16 MED ORDER — LABETALOL HCL 5 MG/ML IV SOLN
10.0000 mg | INTRAVENOUS | Status: DC | PRN
  Filled 2012-12-16: qty 4

## 2012-12-16 NOTE — Discharge Summary (Signed)
Physician Discharge Summary  Patient ID: Matthew ARRON Sr. MRN: [id number] DOB/AGE: 06/08/1961 52 y.o.  Admit date: 12/15/2012 Discharge date: 12/17/2012  Admission Diagnoses: Chest Pain  Hyperglycemia   Discharge Diagnoses:  Principal Problem:   Angina pectoris Active Problems:   Coronary artery disease   Type 2 diabetes mellitus   HTN (hypertension)   Discharged Condition: good  Hospital Course: 52 year old male with known chronic angina pectoris secondary to diffuse CAD with recent stable cardiac catheterization findings January 2014 who presents with complaints of severe chest pain radiating to his left arm. His worsening pain coincided with running out of Ranexa, his angina medication. The patient was give morphine, aspirin and sublingual nitroglycerin for the chest pain, which improved it, but did not resolved it. Therefore, he was started on a nitroglycerin drip and ruled out for a myocardial infarction. The patient's EKG showed stable chronic changes consistent with previous infarcts and no new changes concerning for ischemia. His troponin I was negative and pain resolved overnight. Therefore, it was concluded that the patient was having unstable angina, which is his baseline issue. Case management was consulted and provided financial resources for the patient to obtain Ranexa at a reduced price. Additionally, the patient was discharged with prescriptions for Vicodin 5-325mg  #30 and nitroglycerin tablets, which he will use for chest pain as well. Patient already has an upcoming visit with his cardiologist, and since there were no new issues during this hospitalization, cardiology was not formally consulted.   Hypertension - Matthew Rowe has SBP > 150 upon admission. Therefore, he was given labetalol IV and transitioned to his home PO meds. It was likely that high, because he rand out of Losartan at home and did not have a change to take his PM coreg.   Hyperglycemia - The patient has  poorly controlled DM, type 2. He also had an abscess in the right axilla which was drained in the ED. This infection was likely contributing to the elevated blood sugar. He was placed on sliding scale insulin and discharged on his home regimen. The patient was also given resources for obtaining Lantus at a cheaper cost in the outpatient setting.   Consults: None  Significant Diagnostic Studies:   ECG: Date: 12/16/2012  Rate: 87  Rhythm: normal sinus rhythm  QRS Axis: indeterminate  Intervals: normal  ST/T Wave abnormalities: normal  Conduction Disutrbances:none  Narrative Interpretation: evidence of old infarct in inferior and anterior leads  Old EKG Reviewed: none available  Cardiac Enzymes:  Recent Labs Lab 12/16/12 0104 12/16/12 0644  TROPONINI <0.30 <0.30     Treatments: IV hydration  Discharge Exam: Blood pressure 161/92, pulse 83, temperature 97.9 F (36.6 C), temperature source Oral, resp. rate 21, height 5' 5.5" (1.664 m), weight 223 lb 15.8 oz (101.6 kg), SpO2 96.00%. General: alert, cooperative, no distress and moderately obese  HEENT: PERRLA, extra ocular movement intact, sclera clear, anicteric, oropharynx clear, no lesions and neck supple with midline trachea  Heart: S1, S2 normal, no murmur, rub or gallop, regular rate and rhythm, 2/6 SEM is heard at apex  Lungs: clear to auscultation, no wheezes or rales and unlabored breathing  Abdomen: abdomen is soft without significant tenderness, masses, organomegaly or guarding  Extremities: extremities normal, atraumatic, no cyanosis or edema  Skin: bleeding from site of incision under right axilla  Neurology: normal without focal findings, mental status, speech normal, alert and oriented x3 and PERLA   Disposition: 01-Home or Self Care  Discharge Orders   Future Appointments Provider Department Dept Phone   12/30/2012 3:45 PM Larey Dresser, MD [place] [facility]) [phone]   Future  Orders Complete By Expires     Discharge patient  As directed         Medication List    TAKE these medications       Alcohol Swabs Pads  Use to test blood sugar 3 times daily. Dx code 250.00     amLODipine 10 MG tablet  Commonly known as:  NORVASC  Take 10 mg by mouth daily.     aspirin 325 MG tablet  Take 325 mg by mouth daily.     Blood Glucose Monitoring Suppl Devi  Use to test blood sugar 3 times daily. Dx code 250.00.     carvedilol 25 MG tablet  Commonly known as:  COREG  Take 37.5 mg by mouth 2 (two) times daily with a meal. 1 and 1/2 tablets (total 37.5mg ) two times a day     gabapentin 300 MG capsule  Commonly known as:  NEURONTIN  Take 300 mg by mouth 3 (three) times daily.     glucose blood test strip  Use to test blood sugar 3 times daily. Dx code: 250.00     hydrochlorothiazide 25 MG tablet  Commonly known as:  HYDRODIURIL  Take 25 mg by mouth daily as needed. For blood pressure     HYDROcodone-acetaminophen 5-500 MG per tablet  Commonly known as:  VICODIN  Take 1 tablet by mouth every 8 (eight) hours as needed for pain.     HYDROcodone-acetaminophen 5-325 MG per tablet  Commonly known as:  NORCO/VICODIN  Take 2 tablets by mouth every 6 (six) hours as needed.     insulin glargine 100 UNIT/ML injection  Commonly known as:  LANTUS  Inject 10 Units into the skin daily.     Insulin Pen Needle 31G X 5 MM Misc  Use one daily for insulin injection     isosorbide mononitrate 60 MG 24 hr tablet  Commonly known as:  IMDUR  Take 120 mg by mouth daily.     Lancets Misc  Use to test blood sugar 3 times daily. Dx code: 250.00.     losartan 100 MG tablet  Commonly known as:  COZAAR  Take 50 mg by mouth daily.     metFORMIN 500 MG tablet  Commonly known as:  GLUCOPHAGE  Take 500 mg by mouth 2 (two) times daily with a meal.     nitroGLYCERIN 0.4 MG SL tablet  Commonly known as:  NITROSTAT  Place 0.4 mg under the tongue every 5 (five) minutes x 3 doses  as needed for chest pain.     nitroGLYCERIN 0.4 MG SL tablet  Commonly known as:  NITROSTAT  Place 1 tablet (0.4 mg total) under the tongue every 5 (five) minutes as needed.     pantoprazole 40 MG tablet  Commonly known as:  PROTONIX  Take 40 mg by mouth 2 (two) times daily with a meal.     pravastatin 40 MG tablet  Commonly known as:  PRAVACHOL  Take 40 mg by mouth every evening.     ranolazine 500 MG 12 hr tablet  Commonly known as:  RANEXA  2 (two) times daily. Take one tablet daily. Take one tablet twice a day as tolerated.     vitamin E 1000 UNIT capsule  Take 1,000 Units by mouth 2 (two) times daily.  zolpidem 10 MG tablet  Commonly known as:  AMBIEN  Take 1 tablet (10 mg total) by mouth at bedtime as needed. For insomnia       Follow-up Information   Follow up with COPLAND,JESSICA, MD. Schedule an appointment as soon as possible for a visit in 1 week.   Contact information:   [place] [id number] [id number]       Signed: Dewain Penning 12/17/2012, 8:27 AM

## 2012-12-16 NOTE — Progress Notes (Signed)
12/16/2012 11:52 AM Discharge instructions given to pt and wife. Verbalized understanding. IV's removed x 2.  Discharged via wheelchair to car. Mialee Weyman, Carolynn Comment

## 2012-12-16 NOTE — Plan of Care (Signed)
Problem: Phase III Progression Outcomes Goal: Other Phase III Outcomes/Goals Outcome: Adequate for Discharge Case mgt met with pt regarding obtaining medications.

## 2012-12-16 NOTE — H&P (Signed)
FMTS Attending Admit Note Patient seen and examined by me, I agree with Dr Thea Gist assessment and plan as documented in his note. This morning, Matthew Rowe reports feeling no anginal chest pain.  He has been treated on NTG drip for his chest pain since admission.  He was found to have R axillary abscesses that were I&D'ed in the ED at time of admission.  He denies fevers or chills at home; first discovered the boils yesterday and this (coupled with his recurrent angina) prompted his ED presentation.  He has known DM (last A1C 11/2012 was 8.8%, started on Lantus) and renal insufficiency as well.  He has had cardiac cath with [place] in Jan 2014 (see Dr Claris Gladden notes) and prior proximal LAD stenting July 2012; CABG in [facility] at [facility].  He takes aspirin at this time as his antiplatelet medication.  Matthew Rowe notes that he has not been taking his Ranexa for the past month or so due to cost.  Similarly, Lantus insulin is cost prohibitive to him.  His primary care physician is Dr Janett Billow [place]. Plan to complete cycling of troponins; d/c nitro drip and re-establish on his outpatient meds.  CSW/CM for help in determining benefits for meds. May contact Dr Aundra Dubin regarding admission, as it looks from his January progress note that he wished to see Matthew Rowe again in 3 months.  Matthew Mayotte, MD

## 2012-12-16 NOTE — Telephone Encounter (Signed)
Patient being discharged from hospital today, patient will be getting hydrocodone and nitroglycerine upon discharge form Dr. Wonda Amis, patient instructed to follow up with you after his hospital stay. Dr Maricela Bo had asked if patient has pain contract with you, advised him I will let you know, and it would be fine for him to have the hydrocodone upon discharge, patient has not gotten this medication from you since Feb. Dr Maricela Bo will send you a note in epic regarding this patient, states if you have questions or concerns, you can send message in [place]. Thanks Amy

## 2012-12-16 NOTE — Care Management Note (Signed)
    Page 1 of 1   12/16/2012     11:33:40 AM   CARE MANAGEMENT NOTE 12/16/2012  Patient:  WILKIE, PACKWOOD   Account Number:  [id number]  Date Initiated:  12/16/2012  Documentation initiated by:  Elissa Hefty  Subjective/Objective Assessment:   adm w angina, hyperglycemia     Action/Plan:   lives w wife, pcp dr Janett Billow [place]   Anticipated DC Date:  12/16/2012   Anticipated DC Plan:  [place]  CM consult      Choice offered to / List presented to:             Status of service:   Medicare Important Message given?   (If response is "NO", the following Medicare IM given date fields will be blank) Date Medicare IM given:   Date Additional Medicare IM given:    Discharge Disposition:  HOME/SELF CARE  Per UR Regulation:  Reviewed for med. necessity/level of care/duration of stay  If discussed at Long Length of Stay Meetings, dates discussed:    Comments:  4/8 1129a debbie Ermon Sagan rn,bsn spoke w pt and wife. he is working w pcp and [employer] to get ranexa  cheaper thru ins company. printed off pt assist forms for meds not on 4.00 list at [employer] to see if he can get any of those from drug company. gave pt 3 pt discount cards that may help w brand name meds also.

## 2012-12-16 NOTE — H&P (Signed)
[facility] Admission History and Physical  Patient name: Graysin Pelayo Medical record number: [id number] Date of birth: 12-19-1960 Age: 52 y.o. Gender: male  Primary Care Provider: Lamar Blinks, MD  Chief Complaint: chest pain History of Present Illness: Jaiver Deni. is a 52 y.o. year old male with known chronic angina pectoris secondary to diffuse CAD with recent stable cardiac catheterization findings January 2014 who presents with complaints of severe chest pain radiating to his left arm. The pain occurs several days a week, but was particularly painful today. It was sharp and located in the center of the chest. It radiated to the left arm. It came at rest and with effort. He normally takes imdur, ranexa, gabapentin, and vicodin for his angina. However, patient without has been without Ranexa for 2 weeks. He also does not have any vicodin. He took nitroglycerin today with some relief. The patient also came to the hospital today, because of a blood sugar > 450 at home. He has type 2 DM and checks his CBG daily.   In the ED, he was given morphine and nitro, which brought his pain to a 2/10. He also had an abscess under his right axillae incised and drained.     Patient Active Problem List  Diagnosis  . Chest pain  . Renal failure, acute  . Dyslipidemia  . Coronary artery disease  . Acute MI, inferior wall  . Type 2 diabetes mellitus  . HTN (hypertension)  . Hypernatremia  . Unstable angina  . CKD (chronic kidney disease), stage III  . Polycystic kidney disease  . OSA on CPAP  . Angina pectoris  . Obesity, unspecified   Past Medical History: Past Medical History  Diagnosis Date  . Chest pain     Chronic  . CKD (chronic kidney disease)   . Dyslipidemia   . Coronary artery disease     a. s/p INF MI 1999=>PCI of RCA;  b.  prior extensive stenting of LAD with subsequent stent occlusion distally;  c.  s/p CABG with RIMA to RCA in 2006;   d.  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 Abnormal stress test     a. 11/11:  Myoview: apical infarct with ? small focus of inducible ischemia dInf wall, and Inf Apex, EF 46%;  b. Myoview 9/13:  mild apical scar with mild reversibility and mod inf scar with mod reverisibility, EF 47% => stable and med Rx cont'd   . Hypertension   . Polycystic kidney disease   . Hx of echocardiogram     a. Echo 3/12:  mild LVH, EF 55-60%, trivial AI  . Acute MI, inferior wall 1999  . Heart attack 2001; 2003; 2006  . Exertional dyspnea     "sometimes" (09/15/2012)  . DM2 (diabetes mellitus, type 2)   . History of blood transfusion ?2011  . GERD (gastroesophageal reflux disease)     "once; came to hospital" (09/15/2012)  . OSA on CPAP     Past Surgical History: Past Surgical History  Procedure Laterality Date  . Cystectomy  1990's    off face  . Coronary angioplasty with stent placement  1999 / 2002 / 2004 / 2006?  Marland Kitchen Cardiac catheterization  2012  . Coronary artery bypass graft  2006  CABG X?; in [place]  . Cardiac catheterization  2013  . Cardiac catheterization  2014    LHC (1/14): total occlusion of distal LAD, diffuse disease in ramus, 70% proximal LCx, 50% proximal and mid RCA, 50% distal RCA, patent RIMA-RCA.    Social History: History   Social History  . Marital Status: Married    Spouse Name: N/A    Number of Children: N/A  . Years of Education: N/A   Social History Main Topics  . Smoking status: Never Smoker   . Smokeless tobacco: Never Used     Comment: smoked some as a teenager (high school)  . Alcohol Use: No     Comment: 09/15/2012 "drank a little when I was young"  . Drug Use: No  . Sexually Active: Yes   Other Topics Concern  . None   Social History Narrative  . None    Family History: Family History  Problem  Relation Age of Onset  . Cancer Mother   . Anemia Mother   . Kidney disease Mother   . Diabetes Father   . Heart disease Father   . Heart attack Father   . Heart failure Father   . Hyperlipidemia Father   . Hypertension Father   . Sudden death Father   . Kidney failure Sister 5  . Heart attack Sister   . Kidney disease Sister     Allergies: Allergies  Allergen Reactions  . Ciprocin-Fluocin-Procin (Fluocinolone Acetonide) Rash  . Clarithromycin Itching and Other (See Comments)    "Biaxin" Eyes itch and burn  . Glimepiride (Amaryl) Other (See Comments)    Elevates liver function     Current Facility-Administered Medications  Medication Dose Route Frequency Provider Last Rate Last Dose  . 0.9 %  sodium chloride infusion   Intravenous Continuous Teressa Lower, MD      . nitroGLYCERIN (NITROSTAT) SL tablet 0.4 mg  0.4 mg Sublingual Q5 min PRN Teressa Lower, MD   0.4 mg at 12/16/12 0015   Current Outpatient Prescriptions  Medication Sig Dispense Refill  . amLODipine (NORVASC) 10 MG tablet Take 10 mg by mouth daily.      Marland Kitchen aspirin 325 MG tablet Take 325 mg by mouth daily.       . carvedilol (COREG) 25 MG tablet Take 37.5 mg by mouth 2 (two) times daily with a meal. 1 and 1/2 tablets (total 37.5mg ) two times a day      . gabapentin (NEURONTIN) 300 MG capsule Take 300 mg by mouth 3 (three) times daily.      . hydrochlorothiazide (HYDRODIURIL) 25 MG tablet Take 25 mg by mouth daily as needed. For blood pressure      . HYDROcodone-acetaminophen (VICODIN) 5-500 MG per tablet Take 1 tablet by mouth every 8 (eight) hours as needed for pain.      . isosorbide mononitrate (IMDUR) 60 MG 24 hr tablet Take 120 mg by mouth daily.      Marland Kitchen losartan (COZAAR) 100 MG tablet Take 50 mg by mouth daily.      . metFORMIN (GLUCOPHAGE) 500 MG tablet Take 500 mg by mouth 2 (two) times daily with a meal.      . nitroGLYCERIN (NITROSTAT) 0.4 MG SL tablet Place 0.4 mg under the tongue every 5 (five) minutes x 3  doses as needed for chest pain.      . pantoprazole (PROTONIX) 40 MG tablet Take 40 mg by mouth 2 (two) times daily with a meal.      .  pravastatin (PRAVACHOL) 40 MG tablet Take 40 mg by mouth every evening.      . ranolazine (RANEXA) 500 MG 12 hr tablet 2 (two) times daily. Take one tablet daily. Take one tablet twice a day as tolerated.      . vitamin E 1000 UNIT capsule Take 1,000 Units by mouth 2 (two) times daily.        Marland Kitchen zolpidem (AMBIEN) 10 MG tablet Take 1 tablet (10 mg total) by mouth at bedtime as needed. For insomnia  30 tablet  0  . Alcohol Swabs PADS Use to test blood sugar 3 times daily. Dx code 250.00  300 each  1  . Blood Glucose Monitoring Suppl DEVI Use to test blood sugar 3 times daily. Dx code 250.00.  1 each  0  . glucose blood test strip Use to test blood sugar 3 times daily. Dx code: 250.00  300 each  1  . insulin glargine (LANTUS) 100 UNIT/ML injection Inject 10 Units into the skin daily.      . Insulin Pen Needle 31G X 5 MM MISC Use one daily for insulin injection  100 each  6  . Lancets MISC Use to test blood sugar 3 times daily. Dx code: 250.00.  300 each  1   Review Of Systems: Per HPI with the following additions: nasal congestion, denies fever and chills Otherwise 12 point review of systems was performed and was unremarkable.  Physical Exam: Temp:  [98.3 F (36.8 C)] 98.3 F (36.8 C) (04/07 2023) Pulse Rate:  [81-85] 85 (04/08 0030) Resp:  [16-21] 21 (04/08 0030) BP: (151-158)/(85-106) 158/106 mmHg (04/08 0030) SpO2:  [95 %-100 %] 100 % (04/08 0030)   General: alert, cooperative, no distress and moderately obese HEENT: PERRLA, extra ocular movement intact, sclera clear, anicteric, oropharynx clear, no lesions and neck supple with midline trachea Heart: S1, S2 normal, no murmur, rub or gallop, regular rate and rhythm, 2/6 SEM is heard at apex Lungs: clear to auscultation, no wheezes or rales and unlabored breathing Abdomen: abdomen is soft without  significant tenderness, masses, organomegaly or guarding Extremities: extremities normal, atraumatic, no cyanosis or edema Skin: bleeding from site of incision under right axilla  Neurology: normal without focal findings, mental status, speech normal, alert and oriented x3 and PERLA  Labs and Imaging:  Results for orders placed during the hospital encounter of 12/15/12 (from the past 24 hour(s))  GLUCOSE, CAPILLARY     Status: Abnormal   Collection Time    12/15/12  8:26 PM      Result Value Range   Glucose-Capillary 376 (*) 70 - 99 mg/dL  CBC     Status: Abnormal   Collection Time    12/15/12  8:27 PM      Result Value Range   WBC 8.7  4.0 - 10.5 K/uL   RBC 4.53  4.22 - 5.81 MIL/uL   Hemoglobin 13.1  13.0 - 17.0 g/dL   HCT 36.8 (*) 39.0 - 52.0 %   MCV 81.2  78.0 - 100.0 fL   MCH 28.9  26.0 - 34.0 pg   MCHC 35.6  30.0 - 36.0 g/dL   RDW 12.7  11.5 - 15.5 %   Platelets 202  150 - 400 K/uL  BASIC METABOLIC PANEL     Status: Abnormal   Collection Time    12/15/12  8:27 PM      Result Value Range   Sodium 135  135 - 145 mEq/L   Potassium  4.8  3.5 - 5.1 mEq/L   Chloride 102  96 - 112 mEq/L   CO2 23  19 - 32 mEq/L   Glucose, Bld 356 (*) 70 - 99 mg/dL   BUN 24 (*) 6 - 23 mg/dL   Creatinine, Ser 1.62 (*) 0.50 - 1.35 mg/dL   Calcium 9.5  8.4 - 10.5 mg/dL   GFR calc non Af Amer 47 (*) >90 mL/min   GFR calc Af Amer 55 (*) >90 mL/min  POCT I-STAT TROPONIN I     Status: None   Collection Time    12/15/12  8:51 PM      Result Value Range   Troponin i, poc 0.01  0.00 - 0.08 ng/mL   Comment 3             Dg Chest 2 View  12/15/2012  *RADIOLOGY REPORT*  Clinical Data: Chest pain and hyperglycemia.  CHEST - 2 VIEW  Comparison: Chest radiograph performed 10/31/2012  Findings: The lungs are mildly hypoexpanded; mild bibasilar atelectasis is noted.  There is no evidence of pleural effusion or pneumothorax.  The heart is normal in size; the patient is status post median sternotomy.  No acute  osseous abnormalities are seen.  IMPRESSION: Lungs mildly hypoexpanded; mild bibasilar atelectasis noted.  No significant focal airspace consolidation seen.   Original Report Authenticated By: Santa Lighter, M.D.      Date: 12/16/2012  Rate: 87  Rhythm: normal sinus rhythm  QRS Axis: indeterminate  Intervals: normal  ST/T Wave abnormalities: normal  Conduction Disutrbances:none  Narrative Interpretation: evidence of old infarct in inferior and anterior leads  Old EKG Reviewed: none available     Assessment and Plan: Bruce Windmiller. is a 52 y.o. year old male with known stable, diffuse CAD and chronic angina pectoris presenting with unstable angina in setting of not having ranexa for 2 weeks.   # Chest Pain/Unstable Angina - EKG with no new changes concerning for STEMI; POC trop i 0.0; TIMI score 4; Symptoms seem to be at baseline when he doesn't have Ranexa - Place on nitrolgycerin drip since still having pain - Morphine PRN - Cycle cardiac enzymes and repeat EKG in AM - Hold home imdur since NPO- Spoke with cardiology fellow on call who is in agreement with plan - Biggest component to long term prevention is getting Ranexa to be more affordable for the patient - No need for risk stratification labs as TSH, A1C, and lipids all drawn in last 3 months and being addressed as needed   # HTN - Patient BP > 150/100 in ED; Has been out of ARB and didn't take night time dose of Co-reg - Hold home meds as NPO - Nitro ggt for pain - Labetalol 10 mg IV q 2 PRN SBP > 160 or DBP > 100  # Hyperglycemia - CBG > 375 on admission, poorly controlled DM type 2 combined with abscess - Sliding scale while inpatient, hold home metformin - Most importantly, patient to start insulin as outpatient, but only Lantus  # CAD - S/p multiple MI with CABG and stenting -  Cont ASA and statin when no longer NPO - Call [facility]lstan in AM, but expect routine follow up with Dr. Aundra Dubin on 12/30/12 will be sufficient  if troponin and EKG remain WNL   FENGI - NS @ 125 mL/hr; NPO until ACS ruled out; Protonix BID per home regimen PPX - Heparin SQ TID DISPO - Admit to stepdown unit with active  chest pain   Marena Chancy. Maricela Bo, MD, [place] 12/16/2012, 12:40 AM Family Medicine Resident, PGY-2 [phone] pager

## 2012-12-16 NOTE — Progress Notes (Signed)
Pt refused heparin injection.  Advised pt that heparin was to prevent blood clots from forming while he is in the hospital and not as mobile as he normally is.  Pt continues to refuse heparin injection.

## 2012-12-22 ENCOUNTER — Other Ambulatory Visit: Payer: Self-pay | Admitting: Radiology

## 2012-12-22 DIAGNOSIS — G4700 Insomnia, unspecified: Secondary | ICD-10-CM

## 2012-12-22 DIAGNOSIS — E118 Type 2 diabetes mellitus with unspecified complications: Secondary | ICD-10-CM

## 2012-12-22 DIAGNOSIS — E1165 Type 2 diabetes mellitus with hyperglycemia: Secondary | ICD-10-CM

## 2012-12-22 MED ORDER — CARVEDILOL 25 MG PO TABS: 37.5000 mg | ORAL_TABLET | Freq: Two times a day (BID) | ORAL | Status: DC

## 2012-12-22 MED ORDER — LOSARTAN POTASSIUM 100 MG PO TABS: 50.0000 mg | ORAL_TABLET | Freq: Every day | ORAL | Status: DC

## 2012-12-22 MED ORDER — METFORMIN HCL 500 MG PO TABS: 500.0000 mg | ORAL_TABLET | Freq: Two times a day (BID) | ORAL | Status: DC

## 2012-12-22 MED ORDER — ISOSORBIDE MONONITRATE ER 60 MG PO TB24: 120.0000 mg | ORAL_TABLET | Freq: Every day | ORAL | Status: DC

## 2012-12-22 MED ORDER — HYDROCHLOROTHIAZIDE 25 MG PO TABS: 25.0000 mg | ORAL_TABLET | Freq: Every day | ORAL | Status: DC | PRN

## 2012-12-22 NOTE — Telephone Encounter (Signed)
Message copied by Candice Camp on Mon Dec 22, 2012 10:30 AM ------      Message from: Darreld Mclean      Created: Sat Dec 20, 2012  5:14 PM      Regarding: please disregard prior message regarding this pt       Sorry, did not remember Amy's message from last week.  Please call [name] and find out why he needs the hydrocodone at this time.  I am not comfortable continuing to rx it if it is for chest pain as he has continued to have issues with his heart.  I saw that he just got #30 last week so do not plan to refill this now in any case.   ------

## 2012-12-22 NOTE — Telephone Encounter (Signed)
I have called patient, about hydrocodone. We have gotten fax from mail order, to request hydrocodone. Not good medication for chest pains. He states he is aware of this. He needs meds to [place] [facility] [facility]. He is requesting Coreg (states written by Dr copland, but looks like cardiology has been doing this. Advised him to get the nitroglycerine from cardiology. His meds have been sent in, as requested, but he is asking for refills on the Ranexa and the Ambien. Please advise on these refills. He is going to call cardiology for the Hydrocodone.

## 2012-12-22 NOTE — Telephone Encounter (Signed)
Ranexa should come from the cardiologist as well, I have called him to advise. He agrees. Please advise on Ambien.

## 2012-12-23 NOTE — Telephone Encounter (Signed)
Please give him a call and find out details regarding his Matthew Rowe use.  We need to use caution with ambien, especially given his other heath problems and the fact that he is using neurontin and vicodin.  I think he last received ambien in January?  Is he actively using it or just wants to have it on hand?  If he is able to do without it I would encourage him not to use it, but if he feels it is necessary he may have some for occasional use.

## 2012-12-23 NOTE — Telephone Encounter (Signed)
lantus needs to go to [facility], ambien to [facility].  Called and talked with him today, advised that we need to be very cautious with ambien, especially with his other medications.  However, her feels that he needs it use it on occasion to sleep,  Agreed to send to [facility] for him

## 2012-12-23 NOTE — Telephone Encounter (Signed)
patient indicated yesterday he takes prn Jan 13th was indeed the last date filled, per [facility]

## 2012-12-24 MED ORDER — INSULIN GLARGINE 100 UNIT/ML ~~LOC~~ SOLN: 10.0000 [IU] | Freq: Every day | SUBCUTANEOUS | Status: DC

## 2012-12-24 MED ORDER — ZOLPIDEM TARTRATE 10 MG PO TABS: 5.0000 mg | ORAL_TABLET | Freq: Every evening | ORAL | Status: DC | PRN

## 2012-12-30 ENCOUNTER — Encounter: Payer: Self-pay | Admitting: Cardiology

## 2012-12-30 ENCOUNTER — Ambulatory Visit (INDEPENDENT_AMBULATORY_CARE_PROVIDER_SITE_OTHER): Payer: Medicare PPO | Admitting: Cardiology

## 2012-12-30 VITALS — BP 118/90 | HR 86 | Ht 65.5 in | Wt 226.4 lb

## 2012-12-30 DIAGNOSIS — E669 Obesity, unspecified: Secondary | ICD-10-CM

## 2012-12-30 DIAGNOSIS — E785 Hyperlipidemia, unspecified: Secondary | ICD-10-CM

## 2012-12-30 DIAGNOSIS — I10 Essential (primary) hypertension: Secondary | ICD-10-CM

## 2012-12-30 DIAGNOSIS — I25810 Atherosclerosis of coronary artery bypass graft(s) without angina pectoris: Secondary | ICD-10-CM

## 2012-12-30 DIAGNOSIS — I209 Angina pectoris, unspecified: Secondary | ICD-10-CM

## 2012-12-30 DIAGNOSIS — I2510 Atherosclerotic heart disease of native coronary artery without angina pectoris: Secondary | ICD-10-CM

## 2012-12-30 DIAGNOSIS — N183 Chronic kidney disease, stage 3 unspecified: Secondary | ICD-10-CM

## 2012-12-30 DIAGNOSIS — I208 Other forms of angina pectoris: Secondary | ICD-10-CM

## 2012-12-30 MED ORDER — CARVEDILOL 25 MG PO TABS: 37.5000 mg | ORAL_TABLET | Freq: Two times a day (BID) | ORAL | Status: DC

## 2012-12-30 MED ORDER — RANOLAZINE ER 1000 MG PO TB12: 1000.0000 mg | ORAL_TABLET | Freq: Two times a day (BID) | ORAL | Status: DC

## 2012-12-30 NOTE — Progress Notes (Signed)
Patient ID: Matthew Rowe., male   DOB: 1961/02/06, 52 y.o.   MRN: [id number] PCP: Dr. Lorelei Pont  52 yo with history CAD s/p CABG (RIMA to RCA, anomalous RCA off left cusp), occlusion of the distal LAD, and chronic angina presents for cardiology followup.  Patient has had chronic angina for years. In 1/14, he developed progressive angina and was hospitalized by Richardson Dopp.  LHC was done, showing unchanged anatomy.  RIMA-RCA was patent.  He did well after this on ranolazine.  In 4/14, he ran out of ranolazine and the chest pain returned. He was admitted overnight in 4/14 and ruled out for MI.  Since he was hospitalized earlier this month, he has been on ranolazine 1000 mg bid and has tolerated it well.  He has had no significant chest pain on this dose.  He denies exertional dyspnea.      Labs (7/13): K 4.9, creatinine 1.75, LDL 63 Labs (1/14): K 4.2, creatinine 1.65, TGs 483 (could not calculate LDL) Labs (4/14): K 4.8, creatinine 1.62, HCT 36.8  ECG: NSR, LVH, normal QT interval  PMH: 1. CAD: CABG 2006 at [facility] with [place].  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  Patient has had prior extensive PCI to LAD with occlusion of the distal LAD.  LHC (7/12): proximal LAD stent, extensive mid-distal LAD stenting, 60-70% mid LAD instent restenosis, subtotal distal LAD instent restenosis, LCFx without significant disease, anomalous RCA off left cusp with mild disease, patent RIMA-RCA.  Echo (3/12): EF 55-60%, trivial AI, mild LVH.  Lexiscan myoview (9/13) with small apical scar, moderate inferior scar from base to apex with some reversibility, EF 47% with apical hypokinesis (similar to prior study).  Chronic angina.  LHC (1/14): total occlusion of distal LAD, diffuse disease in ramus, 70% proximal LCx, 50% proximal and mid RCA, 50% distal RCA, patent RIMA-RCA.  2. OSA: CPAP. 3. Type II diabetes 4. Hyperlipidemia: had side effects from Lipitor.  5. HTN 6. CKD: polycystic  kidney disease.  7. Ischemic cardiomyopathy: EF 55-60% by echo in 3/12, EF 47% with apical hypokinesis in 9/13.   SH: Nonsmoker.  Married, lives in [place].  On disability.   FH: Multiple family members with MIs.   ROS: All systems reviewed and negative except as per HPI.   Current Outpatient Prescriptions  Medication Sig Dispense Refill  . Alcohol Swabs PADS Use to test blood sugar 3 times daily. Dx code 250.00  300 each  1  . amLODipine (NORVASC) 10 MG tablet Take 10 mg by mouth daily.      Marland Kitchen aspirin 325 MG tablet Take 325 mg by mouth daily.       . Blood Glucose Monitoring Suppl DEVI Use to test blood sugar 3 times daily. Dx code 250.00.  1 each  0  . carvedilol (COREG) 25 MG tablet Take 1.5 tablets (37.5 mg total) by mouth 2 (two) times daily with a meal. 1 and 1/2 tablets (total 37.5mg ) two times a day  135 tablet  0  . gabapentin (NEURONTIN) 300 MG capsule Take 300 mg by mouth 3 (three) times daily.      Marland Kitchen glucose blood test strip Use to test blood sugar 3 times daily. Dx code: 250.00  300 each  1  . hydrochlorothiazide (HYDRODIURIL) 25 MG tablet Take 1 tablet (25 mg total) by mouth daily as needed. For blood pressure  90 tablet  0  . HYDROcodone-acetaminophen (NORCO/VICODIN) 5-325 MG per tablet Take 2  tablets by mouth every 6 (six) hours as needed.  30 tablet  0  . HYDROcodone-acetaminophen (VICODIN) 5-500 MG per tablet Take 1 tablet by mouth every 8 (eight) hours as needed for pain.      Marland Kitchen insulin glargine (LANTUS) 100 UNIT/ML injection Inject 0.1 mLs (10 Units total) into the skin daily. Adjust dose as directed  15 mL  6  . Insulin Pen Needle 31G X 5 MM MISC Use one daily for insulin injection  100 each  6  . isosorbide mononitrate (IMDUR) 60 MG 24 hr tablet Take 2 tablets (120 mg total) by mouth daily.  180 tablet  0  . Lancets MISC Use to test blood sugar 3 times daily. Dx code: 250.00.  300 each  1  . losartan (COZAAR) 100 MG tablet Take 0.5 tablets (50 mg total) by mouth  daily.  45 tablet  0  . metFORMIN (GLUCOPHAGE) 500 MG tablet Take 1 tablet (500 mg total) by mouth 2 (two) times daily with a meal.  180 tablet  0  . nitroGLYCERIN (NITROSTAT) 0.4 MG SL tablet Place 1 tablet (0.4 mg total) under the tongue every 5 (five) minutes as needed.  100 tablet  1  . pantoprazole (PROTONIX) 40 MG tablet Take 40 mg by mouth 2 (two) times daily with a meal.      . pravastatin (PRAVACHOL) 40 MG tablet Take 40 mg by mouth every evening.      . vitamin E 1000 UNIT capsule Take 1,000 Units by mouth 2 (two) times daily.        Marland Kitchen zolpidem (AMBIEN) 10 MG tablet Take 0.5 tablets (5 mg total) by mouth at bedtime as needed. For insomnia  30 tablet  0  . ranolazine (RANEXA) 1000 MG SR tablet Take 1 tablet (1,000 mg total) by mouth 2 (two) times daily.  60 tablet  11   No current facility-administered medications for this visit.    BP 118/90  Pulse 86  Ht 5' 5.5" (1.664 m)  Wt 226 lb 6.4 oz (102.694 kg)  BMI 37.09 kg/m2  SpO2 96% General: NAD, obese.  Neck: No JVD, no thyromegaly or thyroid nodule.  Lungs: Clear to auscultation bilaterally with normal respiratory effort. CV: Nondisplaced PMI.  Heart regular S1/S2, no S3/S4, no murmur.  No peripheral edema.  No carotid bruit.  Normal pedal pulses.  Abdomen: Soft, nontender, no hepatosplenomegaly, no distention.  Neurologic: Alert and oriented x 3.  Psych: Normal affect. Extremities: No clubbing or cyanosis.   Assessment/Plan: 1. CAD: Chronic angina.  Improved with increase in ranolazine to 1000 mg bid.  ECG today showed normal QT interval.  Recent LHC showed stable anatomy.  He will continue ASA 81, statin, Coreg, amlodipine, ARB, and Imdur.  For angina, would continue current doses of Coreg, amlodipine, and Imdur. I will refer him for cardiac rehab for chronic angina.  2. HTN: BP controlled.  3. Hyperlipidemia: Check lipids/LFTs on pravastatin.  4. CKD: Patient has APKD.  Creatinine stable when checked earlier this month.    Matthew Rowe 12/30/2012

## 2012-12-30 NOTE — Patient Instructions (Addendum)
Your physician recommends that you return for a FASTING lipid profile /liver profile --in the next week or so.    You have been referred to primary care at [facility] to establish with a primary care doctor.  You have been referred to Cardiac Rehab at [facility].      Your physician recommends that you schedule a follow-up appointment in: 2 months with Dr Aundra Dubin.

## 2013-01-07 ENCOUNTER — Other Ambulatory Visit: Payer: Medicare PPO

## 2013-01-19 ENCOUNTER — Encounter: Payer: Self-pay | Admitting: Family Medicine

## 2013-01-29 ENCOUNTER — Telehealth: Payer: Self-pay | Admitting: *Deleted

## 2013-01-29 NOTE — Telephone Encounter (Signed)
Pt walk-in to get samples of Ranexa 1000 mg. Pt takes one tablet by mouth twice a day. A 21 day/ 1000 mg Ranexa  (42 sample pills) given.

## 2013-02-05 ENCOUNTER — Ambulatory Visit: Payer: Medicare PPO | Admitting: Family Medicine

## 2013-02-11 ENCOUNTER — Encounter: Payer: Medicare PPO | Admitting: Family Medicine

## 2013-02-11 NOTE — Progress Notes (Signed)
Error   This encounter was created in error - please disregard. 

## 2013-03-05 ENCOUNTER — Other Ambulatory Visit: Payer: Self-pay | Admitting: Family Medicine

## 2013-03-06 ENCOUNTER — Telehealth: Payer: Self-pay

## 2013-03-06 DIAGNOSIS — G4700 Insomnia, unspecified: Secondary | ICD-10-CM

## 2013-03-06 NOTE — Telephone Encounter (Signed)
[facility] mail order is requesting RF of Zolpidem 10 mg to be faxed [place] to [phone].

## 2013-03-07 MED ORDER — ZOLPIDEM TARTRATE 10 MG PO TABS: 5.0000 mg | ORAL_TABLET | Freq: Every evening | ORAL | Status: DC | PRN

## 2013-03-07 NOTE — Telephone Encounter (Signed)
Ok, may fax in rx for ambien 10 mg, take 1/2 tablet at bedtime prn insomnia.   Disp #45, no refill.  Thanks! Lavella Hammock

## 2013-03-12 ENCOUNTER — Ambulatory Visit: Payer: Medicare PPO | Admitting: Cardiology

## 2013-03-13 ENCOUNTER — Emergency Department (HOSPITAL_COMMUNITY): Payer: Medicare PPO

## 2013-03-13 ENCOUNTER — Inpatient Hospital Stay (HOSPITAL_COMMUNITY)
Admission: EM | Admit: 2013-03-13 | Discharge: 2013-03-14 | DRG: 303 | Disposition: A | Discharge status: Home / Self Care | Payer: Medicare PPO | Attending: Family Medicine | Admitting: Family Medicine

## 2013-03-13 ENCOUNTER — Encounter (HOSPITAL_COMMUNITY): Payer: Self-pay | Admitting: *Deleted

## 2013-03-13 DIAGNOSIS — G4733 Obstructive sleep apnea (adult) (pediatric): Secondary | ICD-10-CM | POA: Diagnosis present

## 2013-03-13 DIAGNOSIS — Z9861 Coronary angioplasty status: Secondary | ICD-10-CM

## 2013-03-13 DIAGNOSIS — I200 Unstable angina: Secondary | ICD-10-CM

## 2013-03-13 DIAGNOSIS — Z794 Long term (current) use of insulin: Secondary | ICD-10-CM

## 2013-03-13 DIAGNOSIS — IMO0001 Reserved for inherently not codable concepts without codable children: Secondary | ICD-10-CM | POA: Diagnosis present

## 2013-03-13 DIAGNOSIS — E785 Hyperlipidemia, unspecified: Secondary | ICD-10-CM | POA: Diagnosis present

## 2013-03-13 DIAGNOSIS — E1121 Type 2 diabetes mellitus with diabetic nephropathy: Secondary | ICD-10-CM | POA: Diagnosis present

## 2013-03-13 DIAGNOSIS — Z598 Other problems related to housing and economic circumstances: Secondary | ICD-10-CM

## 2013-03-13 DIAGNOSIS — IMO0002 Reserved for concepts with insufficient information to code with codable children: Secondary | ICD-10-CM

## 2013-03-13 DIAGNOSIS — Z951 Presence of aortocoronary bypass graft: Secondary | ICD-10-CM

## 2013-03-13 DIAGNOSIS — I252 Old myocardial infarction: Secondary | ICD-10-CM

## 2013-03-13 DIAGNOSIS — Z599 Problem related to housing and economic circumstances, unspecified: Secondary | ICD-10-CM

## 2013-03-13 DIAGNOSIS — Z91199 Patient's noncompliance with other medical treatment and regimen due to unspecified reason: Secondary | ICD-10-CM

## 2013-03-13 DIAGNOSIS — I209 Angina pectoris, unspecified: Secondary | ICD-10-CM

## 2013-03-13 DIAGNOSIS — E1165 Type 2 diabetes mellitus with hyperglycemia: Secondary | ICD-10-CM

## 2013-03-13 DIAGNOSIS — Z79899 Other long term (current) drug therapy: Secondary | ICD-10-CM

## 2013-03-13 DIAGNOSIS — I10 Essential (primary) hypertension: Secondary | ICD-10-CM | POA: Diagnosis present

## 2013-03-13 DIAGNOSIS — K219 Gastro-esophageal reflux disease without esophagitis: Secondary | ICD-10-CM | POA: Diagnosis present

## 2013-03-13 DIAGNOSIS — R739 Hyperglycemia, unspecified: Secondary | ICD-10-CM

## 2013-03-13 DIAGNOSIS — N183 Chronic kidney disease, stage 3 unspecified: Secondary | ICD-10-CM

## 2013-03-13 DIAGNOSIS — I2510 Atherosclerotic heart disease of native coronary artery without angina pectoris: Principal | ICD-10-CM

## 2013-03-13 DIAGNOSIS — Z9114 Patient's other noncompliance with medication regimen: Secondary | ICD-10-CM

## 2013-03-13 DIAGNOSIS — Z91148 Patient's other noncompliance with medication regimen for other reason: Secondary | ICD-10-CM

## 2013-03-13 DIAGNOSIS — R079 Chest pain, unspecified: Secondary | ICD-10-CM

## 2013-03-13 DIAGNOSIS — I129 Hypertensive chronic kidney disease with stage 1 through stage 4 chronic kidney disease, or unspecified chronic kidney disease: Secondary | ICD-10-CM | POA: Diagnosis present

## 2013-03-13 DIAGNOSIS — Z9119 Patient's noncompliance with other medical treatment and regimen: Secondary | ICD-10-CM

## 2013-03-13 LAB — GLUCOSE, CAPILLARY
Glucose-Capillary: 277 mg/dL — ABNORMAL HIGH (ref 70–99)
Glucose-Capillary: 251 mg/dL — ABNORMAL HIGH (ref 70–99)
Glucose-Capillary: 214 mg/dL — ABNORMAL HIGH (ref 70–99)

## 2013-03-13 LAB — CBC WITH DIFFERENTIAL/PLATELET
Eosinophils Relative: 1 % (ref 0–5)
HCT: 38.7 % — ABNORMAL LOW (ref 39.0–52.0)
Hemoglobin: 13.1 g/dL (ref 13.0–17.0)
Lymphocytes Relative: 34 % (ref 12–46)
Lymphs Abs: 1.9 10*3/uL (ref 0.7–4.0)
MCH: 28.3 pg (ref 26.0–34.0)
MCV: 83.6 fL (ref 78.0–100.0)
Monocytes Absolute: 0.5 10*3/uL (ref 0.1–1.0)
Monocytes Relative: 9 % (ref 3–12)
RBC: 4.63 MIL/uL (ref 4.22–5.81)
WBC: 5.6 10*3/uL (ref 4.0–10.5)

## 2013-03-13 LAB — COMPREHENSIVE METABOLIC PANEL
ALT: 8 U/L (ref 0–53)
BUN: 27 mg/dL — ABNORMAL HIGH (ref 6–23)
CO2: 25 mEq/L (ref 19–32)
Calcium: 9.3 mg/dL (ref 8.4–10.5)
Creatinine, Ser: 1.72 mg/dL — ABNORMAL HIGH (ref 0.50–1.35)
GFR calc Af Amer: 51 mL/min — ABNORMAL LOW (ref >90)
GFR calc non Af Amer: 44 mL/min — ABNORMAL LOW (ref >90)
Glucose, Bld: 435 mg/dL — ABNORMAL HIGH (ref 70–99)
Sodium: 134 mEq/L — ABNORMAL LOW (ref 135–145)

## 2013-03-13 LAB — HEMOGLOBIN A1C: Hgb A1c MFr Bld: 10.9 % — ABNORMAL HIGH (ref <5.7)

## 2013-03-13 LAB — MRSA PCR SCREENING: MRSA by PCR: NEGATIVE

## 2013-03-13 LAB — TROPONIN I
Troponin I: <0.3 ng/mL (ref <0.30)
Troponin I: <0.3 ng/mL (ref <0.30)

## 2013-03-13 MED ORDER — INSULIN ASPART 100 UNIT/ML ~~LOC~~ SOLN
0.0000 [IU] | Freq: Three times a day (TID) | SUBCUTANEOUS | Status: DC
  Administered 2013-03-13: 11 [IU] via SUBCUTANEOUS
  Administered 2013-03-14: 7 [IU] via SUBCUTANEOUS
  Administered 2013-03-14: 11 [IU] via SUBCUTANEOUS

## 2013-03-13 MED ORDER — INSULIN ASPART 100 UNIT/ML ~~LOC~~ SOLN
0.0000 [IU] | SUBCUTANEOUS | Status: DC
  Administered 2013-03-13: 3 [IU] via SUBCUTANEOUS
  Administered 2013-03-13: 2 [IU] via SUBCUTANEOUS

## 2013-03-13 MED ORDER — NITROGLYCERIN 0.4 MG SL SUBL: 0.4000 mg | SUBLINGUAL_TABLET | SUBLINGUAL | Status: DC | PRN

## 2013-03-13 MED ORDER — ONDANSETRON HCL 4 MG/2ML IJ SOLN: 4.0000 mg | Freq: Four times a day (QID) | INTRAMUSCULAR | Status: DC | PRN

## 2013-03-13 MED ORDER — SODIUM CHLORIDE 0.9 % IV BOLUS (SEPSIS)
1000.0000 mL | Freq: Once | INTRAVENOUS | Status: AC
  Administered 2013-03-13: 1000 mL via INTRAVENOUS

## 2013-03-13 MED ORDER — HYDROMORPHONE HCL PF 1 MG/ML IJ SOLN: 1.0000 mg | INTRAMUSCULAR | Status: DC | PRN

## 2013-03-13 MED ORDER — INSULIN ASPART 100 UNIT/ML ~~LOC~~ SOLN
10.0000 [IU] | Freq: Once | SUBCUTANEOUS | Status: AC
  Administered 2013-03-13: 10 [IU] via SUBCUTANEOUS

## 2013-03-13 MED ORDER — ONDANSETRON HCL 4 MG/2ML IJ SOLN: 4.0000 mg | Freq: Three times a day (TID) | INTRAMUSCULAR | Status: DC | PRN

## 2013-03-13 MED ORDER — SODIUM CHLORIDE 0.9 % IJ SOLN
3.0000 mL | Freq: Two times a day (BID) | INTRAMUSCULAR | Status: DC
  Administered 2013-03-13 – 2013-03-14 (×2): 3 mL via INTRAVENOUS

## 2013-03-13 MED ORDER — ENOXAPARIN SODIUM 100 MG/ML ~~LOC~~ SOLN
100.0000 mg | Freq: Once | SUBCUTANEOUS | Status: AC
  Administered 2013-03-13: 100 mg via SUBCUTANEOUS
  Filled 2013-03-13: qty 1

## 2013-03-13 MED ORDER — NITROGLYCERIN 0.4 MG SL SUBL
0.4000 mg | SUBLINGUAL_TABLET | SUBLINGUAL | Status: DC | PRN
  Administered 2013-03-13 (×3): 0.4 mg via SUBLINGUAL

## 2013-03-13 MED ORDER — MORPHINE SULFATE 4 MG/ML IJ SOLN
4.0000 mg | Freq: Once | INTRAMUSCULAR | Status: AC
  Administered 2013-03-13: 4 mg via INTRAVENOUS
  Filled 2013-03-13: qty 1

## 2013-03-13 MED ORDER — ASPIRIN 300 MG RE SUPP
300.0000 mg | RECTAL | Status: AC
  Filled 2013-03-13: qty 1

## 2013-03-13 MED ORDER — INSULIN ASPART 100 UNIT/ML ~~LOC~~ SOLN
0.0000 [IU] | Freq: Every day | SUBCUTANEOUS | Status: DC
  Administered 2013-03-13: 2 [IU] via SUBCUTANEOUS

## 2013-03-13 MED ORDER — PANTOPRAZOLE SODIUM 40 MG PO TBEC
40.0000 mg | DELAYED_RELEASE_TABLET | Freq: Two times a day (BID) | ORAL | Status: DC
  Administered 2013-03-13 – 2013-03-14 (×3): 40 mg via ORAL
  Filled 2013-03-13 (×3): qty 1

## 2013-03-13 MED ORDER — GABAPENTIN 300 MG PO CAPS
300.0000 mg | ORAL_CAPSULE | Freq: Three times a day (TID) | ORAL | Status: DC
  Administered 2013-03-13 – 2013-03-14 (×4): 300 mg via ORAL
  Filled 2013-03-13 (×6): qty 1

## 2013-03-13 MED ORDER — INSULIN GLARGINE 100 UNIT/ML ~~LOC~~ SOLN
10.0000 [IU] | Freq: Every day | SUBCUTANEOUS | Status: DC
  Administered 2013-03-13: 10 [IU] via SUBCUTANEOUS
  Filled 2013-03-13 (×3): qty 0.1

## 2013-03-13 MED ORDER — SODIUM CHLORIDE 0.9 % IV SOLN: 250.0000 mL | INTRAVENOUS | Status: DC | PRN

## 2013-03-13 MED ORDER — ASPIRIN 81 MG PO CHEW
324.0000 mg | CHEWABLE_TABLET | Freq: Once | ORAL | Status: AC
  Administered 2013-03-13: 324 mg via ORAL
  Filled 2013-03-13: qty 4

## 2013-03-13 MED ORDER — SODIUM CHLORIDE 0.9 % IJ SOLN: 3.0000 mL | INTRAMUSCULAR | Status: DC | PRN

## 2013-03-13 MED ORDER — METOPROLOL SUCCINATE ER 50 MG PO TB24
50.0000 mg | ORAL_TABLET | Freq: Every day | ORAL | Status: DC
  Administered 2013-03-13 – 2013-03-14 (×2): 50 mg via ORAL
  Filled 2013-03-13 (×2): qty 1

## 2013-03-13 MED ORDER — ASPIRIN 325 MG PO TABS
325.0000 mg | ORAL_TABLET | Freq: Every day | ORAL | Status: DC
  Administered 2013-03-13 – 2013-03-14 (×2): 325 mg via ORAL
  Filled 2013-03-13 (×2): qty 1

## 2013-03-13 MED ORDER — NITROGLYCERIN IN D5W 200-5 MCG/ML-% IV SOLN
2.0000 ug/min | INTRAVENOUS | Status: DC
  Administered 2013-03-13: 10 ug/min via INTRAVENOUS

## 2013-03-13 MED ORDER — ACETAMINOPHEN 325 MG PO TABS: 650.0000 mg | ORAL_TABLET | ORAL | Status: DC | PRN

## 2013-03-13 MED ORDER — ENOXAPARIN SODIUM 100 MG/ML ~~LOC~~ SOLN
100.0000 mg | Freq: Two times a day (BID) | SUBCUTANEOUS | Status: DC
  Filled 2013-03-13 (×4): qty 1

## 2013-03-13 MED ORDER — NITROGLYCERIN IN D5W 200-5 MCG/ML-% IV SOLN
INTRAVENOUS | Status: AC
  Administered 2013-03-13: 15 ug/min via INTRAVENOUS
  Filled 2013-03-13: qty 250

## 2013-03-13 MED ORDER — ONDANSETRON HCL 4 MG/2ML IJ SOLN
4.0000 mg | Freq: Once | INTRAMUSCULAR | Status: AC
  Administered 2013-03-13: 4 mg via INTRAVENOUS
  Filled 2013-03-13: qty 2

## 2013-03-13 MED ORDER — ASPIRIN EC 81 MG PO TBEC: 81.0000 mg | DELAYED_RELEASE_TABLET | Freq: Every day | ORAL | Status: DC

## 2013-03-13 MED ORDER — AMLODIPINE BESYLATE 10 MG PO TABS
10.0000 mg | ORAL_TABLET | Freq: Every day | ORAL | Status: DC
  Administered 2013-03-13 – 2013-03-14 (×2): 10 mg via ORAL
  Filled 2013-03-13 (×2): qty 1

## 2013-03-13 MED ORDER — ASPIRIN 81 MG PO CHEW: 324.0000 mg | CHEWABLE_TABLET | ORAL | Status: AC

## 2013-03-13 NOTE — ED Provider Notes (Signed)
History    CSN: [id number] Arrival date & time 03/13/13  0239  First MD Initiated Contact with Patient 03/13/13 [phone]     Chief Complaint  Patient presents with  . Chest Pain   (Consider location/radiation/quality/duration/timing/severity/associated sxs/prior Treatment) HPI  Matthew Rowe. is a 52 y.o. male c/o intermittent, nonradiatingchest pain, exertional over the last 4 days associated with shortness of breath. This is typical for his angina. Chest pain is described as 7/10, sharp and pressing.Patient is in without his nitroglycerin for 1-2 weeks  Approximately 11 PM last night patient states he has pain "coming from everywhere" affecting arms, legs, abdomen chest and head, with tingling in the bilateral fingertips.  Patient states that this feels different than his prior MIs, states that he thinks is related to his high blood sugar. Patient has been taking metformin but is noncompliant with his insulin, has not had in over 12 months because it is expensive. Patient denies fever, cough, nausea, vomiting, change in bowel or bladder habits, history of DVT or PE, recent immobilization, leg swelling. Complaint with renexa and vicodin, alleviates pain over the last 2 days but not with his shoulder pain last night.  RF: extensive cardiac history with stents and CABG, HTN, HLD, DMTII, CKD, FH Cardiologost: Sande Brothers PCP: Edilia Bo  Past Medical History  Diagnosis Date  . Chest pain     Chronic  . CKD (chronic kidney disease)   . Dyslipidemia   . Coronary artery disease     a. s/p INF MI 1999=>PCI of RCA;  b.  prior extensive stenting of LAD with subsequent stent occlusion distally;  c.  s/p CABG with RIMA to RCA in 2006;  d.  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 Abnormal stress test     a. 11/11:  Myoview: apical infarct with ?  small focus of inducible ischemia dInf wall, and Inf Apex, EF 46%;  b. Myoview 9/13:  mild apical scar with mild reversibility and mod inf scar with mod reverisibility, EF 47% => stable and med Rx cont'd   . Hypertension   . Polycystic kidney disease   . Hx of echocardiogram     a. Echo 3/12:  mild LVH, EF 55-60%, trivial AI  . Acute MI, inferior wall 1999  . Heart attack 2001; 2003; 2006  . Exertional dyspnea     "sometimes" (09/15/2012)  . DM2 (diabetes mellitus, type 2)   . History of blood transfusion ?2011  . GERD (gastroesophageal reflux disease)     "once; came to hospital" (09/15/2012)  . OSA on CPAP    Past Surgical History  Procedure Laterality Date  . Cystectomy  1990's    off face  . Coronary angioplasty with stent placement  1999 / 2002 / 2004 / 2006?  Marland Kitchen Cardiac catheterization  2012  . Coronary artery bypass graft  2006    CABG X?; in [place]  . Cardiac catheterization  2013  . Cardiac catheterization  2014    LHC (1/14): total occlusion of distal LAD, diffuse disease in ramus, 70% proximal LCx, 50% proximal and mid RCA, 50% distal RCA, patent RIMA-RCA.   Family History  Problem Relation Age of Onset  . Cancer Mother   . Anemia Mother   . Kidney disease Mother   . Diabetes Father   .  Heart disease Father   . Heart attack Father   . Heart failure Father   . Hyperlipidemia Father   . Hypertension Father   . Sudden death Father   . Kidney failure Sister 5  . Heart attack Sister   . Kidney disease Sister    History  Substance Use Topics  . Smoking status: Never Smoker   . Smokeless tobacco: Never Used     Comment: smoked some as a teenager (high school)  . Alcohol Use: No     Comment: 09/15/2012 "drank a little when I was young"    Review of Systems  Constitutional:       Negative except as described in HPI  HENT:       Negative except as described in HPI  Respiratory:       Negative except as described in HPI  Cardiovascular:       Negative except as  described in HPI  Gastrointestinal:       Negative except as described in HPI  Genitourinary:       Negative except as described in HPI  Musculoskeletal:       Negative except as described in HPI  Skin:       Negative except as described in HPI  Neurological:       Negative except as described in HPI  All other systems reviewed and are negative.    Allergies  Ciprocin-fluocin-procin; Clarithromycin; and Glimepiride  Home Medications   Current Outpatient Rx  Name  Route  Sig  Dispense  Refill  . amLODipine (NORVASC) 10 MG tablet   Oral   Take 10 mg by mouth daily.         Marland Kitchen aspirin 325 MG tablet   Oral   Take 325 mg by mouth daily.          . carvedilol (COREG) 25 MG tablet      TAKE 1 AND 1/2 TABLETS TWICE DAILY  WITH  A  MEAL   135 tablet   0   . gabapentin (NEURONTIN) 300 MG capsule   Oral   Take 300 mg by mouth 3 (three) times daily.         . hydrochlorothiazide (HYDRODIURIL) 25 MG tablet   Oral   Take 1 tablet (25 mg total) by mouth daily as needed. For blood pressure   90 tablet   0   . isosorbide mononitrate (IMDUR) 60 MG 24 hr tablet      TAKE 2 TABLETS EVERY DAY   180 tablet   0   . losartan (COZAAR) 100 MG tablet   Oral   Take 0.5 tablets (50 mg total) by mouth daily.   45 tablet   0   . metFORMIN (GLUCOPHAGE) 500 MG tablet   Oral   Take 1 tablet (500 mg total) by mouth 2 (two) times daily with a meal.   180 tablet   0   . nitroGLYCERIN (NITROSTAT) 0.4 MG SL tablet   Sublingual   Place 1 tablet (0.4 mg total) under the tongue every 5 (five) minutes as needed.   100 tablet   1   . pantoprazole (PROTONIX) 40 MG tablet   Oral   Take 40 mg by mouth 2 (two) times daily with a meal.         . pravastatin (PRAVACHOL) 40 MG tablet   Oral   Take 40 mg by mouth every evening.         Marland Kitchen  ranolazine (RANEXA) 1000 MG SR tablet   Oral   Take 1 tablet (1,000 mg total) by mouth 2 (two) times daily.   60 tablet   11   . vitamin E  1000 UNIT capsule   Oral   Take 1,000 Units by mouth 2 (two) times daily.           Marland Kitchen zolpidem (AMBIEN) 10 MG tablet   Oral   Take 0.5 tablets (5 mg total) by mouth at bedtime as needed. For insomnia   45 tablet   0     [facility] mail order pharmacy- fax WITH COVER SH .Marland Kitchen.   . Alcohol Swabs PADS      Use to test blood sugar 3 times daily. Dx code 250.00   300 each   1   . Blood Glucose Monitoring Suppl DEVI      Use to test blood sugar 3 times daily. Dx code 250.00.   1 each   0     PRODIGY AUTOCODE METER KIT   . glucose blood test strip      Use to test blood sugar 3 times daily. Dx code: 250.00   300 each   1     PRODIGY NO CODING STRIPS   . Insulin Pen Needle 31G X 5 MM MISC      Use one daily for insulin injection   100 each   6   . Lancets MISC      Use to test blood sugar 3 times daily. Dx code: 250.00.   300 each   1     PRODIGY TWIST TOP 28G    BP 125/83  Pulse 76  Temp(Src) 98.2 F (36.8 C) (Oral)  Resp 17  SpO2 98% Physical Exam  Nursing note and vitals reviewed. Constitutional: He is oriented to person, place, and time. He appears well-developed and well-nourished. No distress.  HENT:  Head: Normocephalic.  Mouth/Throat: Oropharynx is clear and moist.  Eyes: Conjunctivae and EOM are normal. Pupils are equal, round, and reactive to light.  Neck: Normal range of motion. No JVD present.  Cardiovascular: Normal rate, regular rhythm and intact distal pulses.   Pulmonary/Chest: Effort normal and breath sounds normal. No stridor. No respiratory distress. He has no wheezes. He has no rales. He exhibits no tenderness.  Abdominal: Soft. Bowel sounds are normal. He exhibits no distension and no mass. There is no tenderness. There is no rebound and no guarding.  Musculoskeletal: Normal range of motion. He exhibits no edema and no tenderness.  No calf asymmetry, superficial collaterals, palpable cords, edema, Homans sign negative bilaterally.     Neurological: He is alert and oriented to person, place, and time.  Psychiatric: He has a normal mood and affect.    ED Course  Procedures (including critical care time) Labs Reviewed  CBC WITH DIFFERENTIAL - Abnormal; Notable for the following:    HCT 38.7 (*)    All other components within normal limits  COMPREHENSIVE METABOLIC PANEL - Abnormal; Notable for the following:    Sodium 134 (*)    Glucose, Bld 435 (*)    BUN 27 (*)    Creatinine, Ser 1.72 (*)    GFR calc non Af Amer 44 (*)    GFR calc Af Amer 51 (*)    All other components within normal limits  TROPONIN I   Dg Chest 2 View  03/13/2013   *RADIOLOGY REPORT*  Clinical Data: Short of breath.  CHEST - 2 VIEW  Comparison: 12/15/2012.  Findings:  Cardiopericardial silhouette within normal limits. Mediastinal contours normal. Trachea midline.  No airspace disease or effusion.  Median sternotomy wires are present.  Residual epicardial pacing leads can be seen on the lateral view.  No airspace disease.  There appears to be a long segment left anterior descending coronary artery stent.  IMPRESSION: No active cardiopulmonary disease.   Original Report Authenticated By: Dereck Ligas, M.D.    Date: 03/13/2013  Rate: 76  Rhythm: normal sinus rhythm  QRS Axis: normal  Intervals: normal  ST/T Wave abnormalities: nonspecific ST changes  Conduction Disutrbances:none  Narrative Interpretation:   Old EKG Reviewed: unchanged    1. Chest pain   2. Hyperglycemia without ketosis   3. Noncompliance with medication regimen   4. CKD (chronic kidney disease), stage III     MDM   Filed Vitals:   03/13/13 0330 03/13/13 0345 03/13/13 0347 03/13/13 0355  BP: 137/86 141/88 138/92 125/83  Pulse: 77 76 76 76  Temp:      TempSrc:      Resp: 20 17 16 17   SpO2: 98% 97% 98% 98%     Matthew Rowe. is a 52 y.o. male with chest pain, shortness of breath, worsening over the course of 4 days, also elevated blood sugar with diffuse  myalgia.   EKG is nonischemic and troponin is negative. Patient is not anemic and there are no significant electrolyte abnormalities creatinine is elevated but not out of line for his baseline with CK D. Glucose is 435 he has a normal anion gap of 10. Fluid initiated. Patient will be admitted for cardiac rule out and hyperglycemia.   Nitroglycerin drip initiated, patient will be admitted to a step down bed by Dr. Shanon Brow.   Medications  nitroGLYCERIN (NITROSTAT) SL tablet 0.4 mg (0.4 mg Sublingual Given 03/13/13 0404)  sodium chloride 0.9 % bolus 1,000 mL (not administered)  aspirin chewable tablet 324 mg (324 mg Oral Given 03/13/13 0352)     Monico Blitz, PA-C 03/13/13 0522

## 2013-03-13 NOTE — Care Management Note (Signed)
    Page 1 of 1   03/13/2013     1:28:27 PM   CARE MANAGEMENT NOTE 03/13/2013  Patient:  DEONTRAE, FOLINO   Account Number:  [id number]  Date Initiated:  03/13/2013  Documentation initiated by:  Felix Ahmadi Assessment:   53 yr-old male adm with dx of [place]; lives with spouse; PCP is Dr Silvestre Mesi with [facility]  CM consult  PCP issues  Medication Assistance      Per [facility] Regulation:  Reviewed for med. necessity/level of care/duration of stay  Comments:  03/13/13 Florin Per pt, he is now using [facility] and copays for all meds except Ranexa and Novolog are manageable.  Copays for those 2 meds are $65.  Pts with government insurance do not qualify for [employer] patient assistance program.  Provided telephone number so he could determine if he qualifies for any assistance with Ranexa, which is currently being provided by Dr Aundra Dubin.  Pt also wants another PCP - states it is difficult getting scripts faxed to [facility] in a timely manner - provided [facility] number so that pt can call for names of PCPs that are currently accepting Medicare pts.

## 2013-03-13 NOTE — ED Notes (Signed)
The pt has had mid-chest pain with sob  For 3-4 days

## 2013-03-13 NOTE — ED Provider Notes (Signed)
Medical screening examination/treatment/procedure(s) were performed by non-physician practitioner and as supervising physician I was immediately available for consultation/collaboration.  Kalman Drape, MD 03/13/13 [phone]

## 2013-03-13 NOTE — H&P (Signed)
PCP:   Lucretia Kern., DO   Chief Complaint:  sscp  HPI: 52 yo male with h/o cad/cabg, uncontrolled dm, htn, osa compliant mostly with cpap, ckd, comes in with 4 days of worsening sscp worse with exertion and associated with sob.  Pain is better with rest usually but not today.  Normally has ntg sl at home but ran out of this 2 weeks ago, normally takes ntg 1-3 x a month.  Takes imdur and ranexa which really helps with his pain and has been taking that.  No fevers.  No cough.  No le edema or swelling.  No n/v.  Received 3 rounds of ntg sl in ED with minimal improvement then 4mg  morphine with minimal improvement.  Has gotten significant improvement with ntg gtt.  His cardiologist has been working to get him help with his medications as he is on many, and was paying a lot of copays over $300/month but has recently been set up where he wont have to pay as much copays and will be able to afford his insulin and ntg pills now.  Review of Systems:  Positive and negative as per HPI otherwise all other systems are negative  Past Medical History: Past Medical History  Diagnosis Date  . Chest pain     Chronic  . CKD (chronic kidney disease)   . Dyslipidemia   . Coronary artery disease     a. s/p INF MI 1999=>PCI of RCA;  b.  prior extensive stenting of LAD with subsequent stent occlusion distally;  c.  s/p CABG with RIMA to RCA in 2006;  d.  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 Abnormal stress test     a. 11/11:  Myoview: apical infarct with ? small focus of inducible ischemia dInf wall, and Inf Apex, EF 46%;  b. Myoview 9/13:  mild apical scar with mild reversibility and mod inf scar with mod reverisibility, EF 47% => stable and med Rx cont'd   . Hypertension   . Polycystic kidney disease   . Hx of echocardiogram     a. Echo 3/12:  mild LVH, EF  55-60%, trivial AI  . Acute MI, inferior wall 1999  . Heart attack 2001; 2003; 2006  . Exertional dyspnea     "sometimes" (09/15/2012)  . DM2 (diabetes mellitus, type 2)   . History of blood transfusion ?2011  . GERD (gastroesophageal reflux disease)     "once; came to hospital" (09/15/2012)  . OSA on CPAP    Past Surgical History  Procedure Laterality Date  . Cystectomy  1990's    off face  . Coronary angioplasty with stent placement  1999 / 2002 / 2004 / 2006?  Marland Kitchen Cardiac catheterization  2012  . Coronary artery bypass graft  2006    CABG X?; in [place]  . Cardiac catheterization  2013  . Cardiac catheterization  2014    LHC (1/14): total occlusion of distal LAD, diffuse disease in ramus, 70% proximal LCx, 50% proximal and mid RCA, 50% distal RCA, patent RIMA-RCA.    Medications: Prior to Admission medications   Medication Sig Start Date End Date Taking? Authorizing Provider  amLODipine (NORVASC) 10 MG tablet Take 10 mg by mouth daily. 12/12/12  Yes Gay Filler Copland, MD  aspirin 325 MG tablet Take 325 mg  by mouth daily.    Yes Historical Provider, MD  carvedilol (COREG) 25 MG tablet TAKE 1 AND 1/2 TABLETS TWICE DAILY  WITH  A  MEAL 03/05/13  Yes Eleanore E Egan, PA-C  gabapentin (NEURONTIN) 300 MG capsule Take 300 mg by mouth 3 (three) times daily. 12/12/12  Yes Gay Filler Copland, MD  hydrochlorothiazide (HYDRODIURIL) 25 MG tablet Take 1 tablet (25 mg total) by mouth daily as needed. For blood pressure 12/22/12 01/22/14 Yes Jessica C Copland, MD  isosorbide mononitrate (IMDUR) 60 MG 24 hr tablet TAKE 2 TABLETS EVERY DAY 03/05/13  Yes Eleanore E Egan, PA-C  losartan (COZAAR) 100 MG tablet Take 0.5 tablets (50 mg total) by mouth daily. 12/22/12  Yes Gay Filler Copland, MD  metFORMIN (GLUCOPHAGE) 500 MG tablet Take 1 tablet (500 mg total) by mouth 2 (two) times daily with a meal. 12/22/12  Yes Gay Filler Copland, MD  nitroGLYCERIN (NITROSTAT) 0.4 MG SL tablet Place 1 tablet (0.4 mg total) under  the tongue every 5 (five) minutes as needed. 12/16/12  Yes Angelica Ran, MD  pantoprazole (PROTONIX) 40 MG tablet Take 40 mg by mouth 2 (two) times daily with a meal. 12/12/12 12/12/13 Yes Jessica C Copland, MD  pravastatin (PRAVACHOL) 40 MG tablet Take 40 mg by mouth every evening. 12/12/12  Yes Gay Filler Copland, MD  ranolazine (RANEXA) 1000 MG SR tablet Take 1 tablet (1,000 mg total) by mouth 2 (two) times daily. 12/30/12  Yes Larey Dresser, MD  vitamin E 1000 UNIT capsule Take 1,000 Units by mouth 2 (two) times daily.     Yes Historical Provider, MD  zolpidem (AMBIEN) 10 MG tablet Take 0.5 tablets (5 mg total) by mouth at bedtime as needed. For insomnia 03/07/13  Yes Darreld Mclean, MD  Alcohol Swabs PADS Use to test blood sugar 3 times daily. Dx code 250.00 12/12/12   Darreld Mclean, MD  Blood Glucose Monitoring Suppl DEVI Use to test blood sugar 3 times daily. Dx code 250.00. 12/12/12   Gay Filler Copland, MD  glucose blood test strip Use to test blood sugar 3 times daily. Dx code: 250.00 12/12/12   Darreld Mclean, MD  Insulin Pen Needle 31G X 5 MM MISC Use one daily for insulin injection 12/02/12   Gay Filler Copland, MD  Lancets MISC Use to test blood sugar 3 times daily. Dx code: 250.00. 12/12/12   Darreld Mclean, MD    Allergies:   Allergies  Allergen Reactions  . Ciprocin-Fluocin-Procin (Fluocinolone Acetonide) Rash  . Clarithromycin Itching and Other (See Comments)    "Biaxin" Eyes itch and burn  . Glimepiride (Amaryl) Other (See Comments)    Elevates liver function     Social History:  reports that he has never smoked. He has never used smokeless tobacco. He reports that he does not drink alcohol or use illicit drugs.  Family History: Family History  Problem Relation Age of Onset  . Cancer Mother   . Anemia Mother   . Kidney disease Mother   . Diabetes Father   . Heart disease Father   . Heart attack Father   . Heart failure Father   . Hyperlipidemia Father   .  Hypertension Father   . Sudden death Father   . Kidney failure Sister 5  . Heart attack Sister   . Kidney disease Sister     Physical Exam: Filed Vitals:   03/13/13 0330 03/13/13 0345 03/13/13 0347 03/13/13 0355  BP: 137/86  141/88 138/92 125/83  Pulse: 77 76 76 76  Temp:      TempSrc:      Resp: 20 17 16 17   SpO2: 98% 97% 98% 98%   General appearance: alert, cooperative and no distress Head: Normocephalic, without obvious abnormality, atraumatic Eyes: negative Nose: Nares normal. Septum midline. Mucosa normal. No drainage or sinus tenderness. Neck: no JVD and supple, symmetrical, trachea midline Lungs: clear to auscultation bilaterally Heart: regular rate and rhythm, S1, S2 normal, no murmur, click, rub or gallop Abdomen: soft, non-tender; bowel sounds normal; no masses,  no organomegaly Extremities: extremities normal, atraumatic, no cyanosis or edema Pulses: 2+ and symmetric Skin: Skin color, texture, turgor normal. No rashes or lesions Neurologic: Grossly normal    Labs on Admission:   Recent Labs  03/13/13 0243  NA 134*  K 4.4  CL 99  CO2 25  GLUCOSE 435*  BUN 27*  CREATININE 1.72*  CALCIUM 9.3    Recent Labs  03/13/13 0243  AST 13  ALT 8  ALKPHOS 115  BILITOT 0.3  PROT 7.7  ALBUMIN 4.0    Recent Labs  03/13/13 0243  WBC 5.6  NEUTROABS 3.1  HGB 13.1  HCT 38.7*  MCV 83.6  PLT 191    Recent Labs  03/13/13 0244  TROPONINI <0.30   Radiological Exams on Admission: Dg Chest 2 View  03/13/2013   *RADIOLOGY REPORT*  Clinical Data: Short of breath.  CHEST - 2 VIEW  Comparison: 12/15/2012.  Findings:  Cardiopericardial silhouette within normal limits. Mediastinal contours normal. Trachea midline.  No airspace disease or effusion.  Median sternotomy wires are present.  Residual epicardial pacing leads can be seen on the lateral view.  No airspace disease.  There appears to be a long segment left anterior descending coronary artery stent.   IMPRESSION: No active cardiopulmonary disease.   Original Report Authenticated By: Dereck Ligas, M.D.    Assessment/Plan  52 yo male with [place] probably due to medical noncompliance from financial issues which seemed to have been resolved. Principal Problem:   Unstable angina Active Problems:   Coronary artery disease, followed by [facility]   HTN (hypertension)   CKD (chronic kidney disease), stage III   Financial difficulty   Medically noncompliant   Diabetes type 2, uncontrolled  Bblocker.  ntg gtt.  Stepdown.  Asa.  lovenox full dosing.  Ssi.  Clarify home meds.  sw consult prior to d/c to make sure they really will be able to afford his medication.  He is looking for a new pcp, will also need a pcp list for options.  Full code.    Amerika Nourse A 03/13/2013, 5:04 AM

## 2013-03-13 NOTE — Progress Notes (Signed)
ANTICOAGULATION CONSULT NOTE - Initial Consult  Pharmacy Consult for Lovenox Indication: chest pain/ACS  Allergies  Allergen Reactions  . Ciprocin-Fluocin-Procin (Fluocinolone Acetonide) Rash  . Clarithromycin Itching and Other (See Comments)    "Biaxin" Eyes itch and burn  . Glimepiride (Amaryl) Other (See Comments)    Elevates liver function     Patient Measurements: Weight: ~102kg  Vital Signs: Temp: 98.2 F (36.8 C) (07/04 0246) Temp src: Oral (07/04 0246) BP: 138/72 mmHg (07/04 0545) Pulse Rate: 75 (07/04 0545)  Labs:  Recent Labs  03/13/13 0243 03/13/13 0244  HGB 13.1  --   HCT 38.7*  --   PLT 191  --   CREATININE 1.72*  --   TROPONINI  --  <0.30     Medical History: Past Medical History  Diagnosis Date  . Chest pain     Chronic  . Dyslipidemia   . Coronary artery disease     a. s/p INF MI 1999=>PCI of RCA;  b.  prior extensive stenting of LAD with subsequent stent occlusion distally;  c.  s/p CABG with RIMA to RCA in 2006;  d.  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 Abnormal stress test     a. 11/11:  Myoview: apical infarct with ? small focus of inducible ischemia dInf wall, and Inf Apex, EF 46%;  b. Myoview 9/13:  mild apical scar with mild reversibility and mod inf scar with mod reverisibility, EF 47% => stable and med Rx cont'd   . Hypertension   . Hx of echocardiogram     a. Echo 3/12:  mild LVH, EF 55-60%, trivial AI  . Acute MI, inferior wall 1999  . Heart attack 2001; 2003; 2006  . Exertional dyspnea     "sometimes" (09/15/2012)  . DM2 (diabetes mellitus, type 2)   . History of blood transfusion ?2011  . GERD (gastroesophageal reflux disease)     "once; came to hospital" (09/15/2012)  . OSA on CPAP   . CKD (chronic kidney disease)   . Polycystic kidney disease     Medications:  Prescriptions  prior to admission  Medication Sig Dispense Refill  . amLODipine (NORVASC) 10 MG tablet Take 10 mg by mouth daily.      Marland Kitchen aspirin 325 MG tablet Take 325 mg by mouth daily.       . carvedilol (COREG) 25 MG tablet TAKE 1 AND 1/2 TABLETS TWICE DAILY  WITH  A  MEAL  135 tablet  0  . gabapentin (NEURONTIN) 300 MG capsule Take 300 mg by mouth 3 (three) times daily.      . hydrochlorothiazide (HYDRODIURIL) 25 MG tablet Take 1 tablet (25 mg total) by mouth daily as needed. For blood pressure  90 tablet  0  . isosorbide mononitrate (IMDUR) 60 MG 24 hr tablet TAKE 2 TABLETS EVERY DAY  180 tablet  0  . losartan (COZAAR) 100 MG tablet Take 0.5 tablets (50 mg total) by mouth daily.  45 tablet  0  . metFORMIN (GLUCOPHAGE) 500 MG tablet Take 1 tablet (500 mg total) by mouth 2 (two) times daily with a meal.  180 tablet  0  . nitroGLYCERIN (NITROSTAT) 0.4 MG SL tablet Place 1 tablet (0.4 mg total) under the tongue every 5 (five) minutes as needed.  100 tablet  1  .  pantoprazole (PROTONIX) 40 MG tablet Take 40 mg by mouth 2 (two) times daily with a meal.      . pravastatin (PRAVACHOL) 40 MG tablet Take 40 mg by mouth every evening.      . ranolazine (RANEXA) 1000 MG SR tablet Take 1 tablet (1,000 mg total) by mouth 2 (two) times daily.  60 tablet  11  . vitamin E 1000 UNIT capsule Take 1,000 Units by mouth 2 (two) times daily.        Marland Kitchen zolpidem (AMBIEN) 10 MG tablet Take 0.5 tablets (5 mg total) by mouth at bedtime as needed. For insomnia  45 tablet  0  . Alcohol Swabs PADS Use to test blood sugar 3 times daily. Dx code 250.00  300 each  1  . Blood Glucose Monitoring Suppl DEVI Use to test blood sugar 3 times daily. Dx code 250.00.  1 each  0  . glucose blood test strip Use to test blood sugar 3 times daily. Dx code: 250.00  300 each  1  . Insulin Pen Needle 31G X 5 MM MISC Use one daily for insulin injection  100 each  6  . Lancets MISC Use to test blood sugar 3 times daily. Dx code: 250.00.  300 each  1    Scheduled:  . amLODipine  10 mg Oral Daily  . aspirin  324 mg Oral NOW   Or  . aspirin  300 mg Rectal NOW  . [START ON 03/14/2013] aspirin EC  81 mg Oral Daily  . aspirin  325 mg Oral Daily  . enoxaparin (LOVENOX) injection  100 mg Subcutaneous Q12H  . gabapentin  300 mg Oral TID  . insulin aspart  0-9 Units Subcutaneous Q4H  . insulin aspart  10 Units Subcutaneous Once  . metoprolol succinate  50 mg Oral Daily  . pantoprazole  40 mg Oral BID WC  . sodium chloride  3 mL Intravenous Q12H   Infusions:  . nitroGLYCERIN 15 mcg/min (03/13/13 0558)    Assessment: 52yo male c/o CP x3-4d, initial troponin negative, to begin Lovenox.  Goal of Therapy:  Anti-Xa level 0.6-1.2 units/ml 4hrs after LMWH dose given Monitor platelets by anticoagulation protocol: Yes   Plan:  Will begin Lovenox 100mg  SQ Q12H and monitor CBC.  Wynona Neat, PharmD, BCPS  03/13/2013,6:35 AM

## 2013-03-13 NOTE — Progress Notes (Signed)
Report from Night RN. Chart reviewed together. Handoff complete.Introductions complete. Will continue to monitor and advise attending as needed.

## 2013-03-13 NOTE — Progress Notes (Signed)
Followup note. Patient admitted early this morning to step down unit for unstable angina and ongoing chest pain. Seen after transferred to step down unit. Has had difficult time obtaining medications because of financial reasons. Noted to be significantly hyperglycemic, but not in DKA. Responding well to insulin. Enzymes x2 negative. Discussed with cardiology and with recent cath, no signs of acute myocardial infarction. Patient is chest pain-free and attempts are being made to wean off nitroglycerin. CBG done at 200 and patient allowed to eat. Change sliding scale to with meals and nightly. Once nitroglycerin drip discontinued, will transfer to floor and plan for discharge tomorrow.

## 2013-03-14 DIAGNOSIS — R079 Chest pain, unspecified: Secondary | ICD-10-CM

## 2013-03-14 DIAGNOSIS — R7309 Other abnormal glucose: Secondary | ICD-10-CM

## 2013-03-14 LAB — TROPONIN I: Troponin I: <0.3 ng/mL (ref <0.30)

## 2013-03-14 LAB — BASIC METABOLIC PANEL
Calcium: 8.7 mg/dL (ref 8.4–10.5)
GFR calc Af Amer: 54 mL/min — ABNORMAL LOW (ref >90)
GFR calc non Af Amer: 47 mL/min — ABNORMAL LOW (ref >90)
Potassium: 4.3 mEq/L (ref 3.5–5.1)
Sodium: 135 mEq/L (ref 135–145)

## 2013-03-14 LAB — CBC
Hemoglobin: 12.7 g/dL — ABNORMAL LOW (ref 13.0–17.0)
MCH: 28.5 pg (ref 26.0–34.0)
MCHC: 34.1 g/dL (ref 30.0–36.0)
Platelets: 198 10*3/uL (ref 150–400)
RDW: 12.7 % (ref 11.5–15.5)

## 2013-03-14 LAB — GLUCOSE, CAPILLARY
Glucose-Capillary: 265 mg/dL — ABNORMAL HIGH (ref 70–99)
Glucose-Capillary: 217 mg/dL — ABNORMAL HIGH (ref 70–99)

## 2013-03-14 LAB — LIPID PANEL
Cholesterol: 163 mg/dL (ref 0–200)
Total CHOL/HDL Ratio: 7.4 RATIO

## 2013-03-14 MED ORDER — INSULIN NPH ISOPHANE & REGULAR (70-30) 100 UNIT/ML ~~LOC~~ SUSP: 6.0000 [IU] | Freq: Two times a day (BID) | SUBCUTANEOUS | Status: DC

## 2013-03-14 MED ORDER — ENOXAPARIN SODIUM 40 MG/0.4ML ~~LOC~~ SOLN
40.0000 mg | SUBCUTANEOUS | Status: DC
  Filled 2013-03-14: qty 0.4

## 2013-03-14 NOTE — Discharge Summary (Signed)
Physician Discharge Summary  Matthew Rowe [id number] DOB: [birth year] DOA: 03/13/2013  PCP: Lamar Blinks, MD  Admit date: 03/13/2013 Discharge date: 03/14/2013  Time spent: 35 minutes  Recommendations for Outpatient Follow-up:  1. Patient's insulin is being changed from Lantus to 70/30 which will be much more financially affordable  Discharge Diagnoses:  Principal Problem:   Unstable angina Active Problems:   Coronary artery disease, followed by [facility]   HTN (hypertension)   CKD (chronic kidney disease), stage III   Financial difficulty   Medically noncompliant   Diabetes type 2, uncontrolled   Discharge Condition: Improved, being discharged home  Diet recommendation: Carb modified low sodium  Filed Weights   03/13/13 0800 03/14/13 0513  Weight: 106 kg (233 lb 11 oz) 101.334 kg (223 lb 6.4 oz)    History of present illness:  On 7/4: 52 yo male with h/o cad/cabg, uncontrolled dm, htn, osa compliant mostly with cpap, ckd, comes in with 4 days of worsening sscp worse with exertion and associated with sob. Pain is better with rest usually but not today. Normally has ntg sl at home but ran out of this 2 weeks ago, normally takes ntg 1-3 x a month. Takes imdur and ranexa which really helps with his pain and has been taking that. No fevers. No cough. No le edema or swelling. No n/v. Received 3 rounds of ntg sl in ED with minimal improvement then 4mg  morphine with minimal improvement. Has gotten significant improvement with ntg gtt. His cardiologist has been working to get him help with his medications as he is on many, and was paying a lot of copays over $300/month but has recently been set up where he wont have to pay as much copays and will be able to afford his insulin and ntg pills now.   Hospital Course:  Principal Problem:   Unstable angina: Because of intractable chest pain, patient was placed in the step down unit on nitroglycerin drip. EKG and serial  enzymes ruled out any type of cardiac ischemia or infarction. Pain was able to be controlled and eventually resolved. Patient was able to weaned off of nitroglycerin drip and transferred to floor. Followup troponin was normal. No evidence of cardiac acute ischemia. Patient will continue on his home medications and followup with [place] cardiology.    Coronary artery disease, followed by [facility] Card[facility]: See above.    HTN (hypertension): Stable. Continue home meds.   CKD (chronic kidney disease), stage III: Stable. Creatinine on discharge was 1.63.    Financial difficulty: See below.   Medically noncompliant   Diabetes type 2, uncontrolled: Patient has a long-time history of diabetes. Last year, insulin was added to his medical regimen. Patient has had a very difficult time paying for his insulin because he is on a very fixed income, yet he had does have insurance. His Lantus insulin is $55 a month and this is only after months of working with his [employer]. He has not been taking this. He has been still taking his metformin which he has been able to work out with his [employer] for free. Upon admission, he was found to not be in DKA. He was put on sliding scale and responded well and sugars soon improved. He was given a dose of Lantus on 7/4 night. In discussion with the patient, he was not aware of other alternative insulins such as 70/30. Her case manager was able to call [employer] and Price his 70/30 insulin at $24.  Patient  was quite amenable to trying a different insulin. I've given him a prescription for 70/30 insulin at 6 units twice a day. His primary care physician can adjust this dose accordingly. I do not feel that at this time we need to adjust his metformin or adding glipizide given we now have an insulin he can take consistently at a better price.   Procedures:  None  Consultations:  Case discussed with [place] cardiology  Discharge Exam: Filed Vitals:   03/13/13  1743 03/13/13 2026 03/14/13 0513 03/14/13 0937  BP: 149/92 162/99 143/89 149/87  Pulse: 73 73 72 75  Temp: 98.1 F (36.7 C) 97.3 F (36.3 C) 97.9 F (36.6 C)   TempSrc: Oral Oral Oral   Resp: 18 18 20    Height:      Weight:   101.334 kg (223 lb 6.4 oz)   SpO2: 100% 100% 97%     General: Alert and oriented x3, no acute distress Cardiovascular: Regular rate and rhythm, [id number], soft 2/6 systolic ejection murmur Respiratory: Clear to auscultation bilaterally Abdomen: Soft, nontender, nondistended, positive bowel sounds Extremities: No clubbing or cyanosis or edema  Discharge Instructions  Discharge Orders   Future Appointments Provider Department Dept Phone   04/01/2013 10:45 AM Larey Dresser, MD [facility]) [phone]   Future Orders Complete By Expires     Diet - low sodium heart healthy  As directed     Diet Carb Modified  As directed     Increase activity slowly  As directed         Medication List         Alcohol Swabs Pads  Use to test blood sugar 3 times daily. Dx code 250.00     amLODipine 10 MG tablet  Commonly known as:  NORVASC  Take 10 mg by mouth daily.     aspirin 325 MG tablet  Take 325 mg by mouth daily.     Blood Glucose Monitoring Suppl Devi  Use to test blood sugar 3 times daily. Dx code 250.00.     carvedilol 25 MG tablet  Commonly known as:  COREG  TAKE 1 AND 1/2 TABLETS TWICE DAILY  WITH  A  MEAL     gabapentin 300 MG capsule  Commonly known as:  NEURONTIN  Take 300 mg by mouth 3 (three) times daily.     glucose blood test strip  Use to test blood sugar 3 times daily. Dx code: 250.00     hydrochlorothiazide 25 MG tablet  Commonly known as:  HYDRODIURIL  Take 1 tablet (25 mg total) by mouth daily as needed. For blood pressure     insulin NPH-regular (70-30) 100 UNIT/ML injection  Commonly known as:  NOVOLIN 70/30  Inject 6 Units into the skin 2 (two) times daily with a meal.     Insulin Pen Needle 31G X 5  MM Misc  Use one daily for insulin injection     isosorbide mononitrate 60 MG 24 hr tablet  Commonly known as:  IMDUR  TAKE 2 TABLETS EVERY DAY     Lancets Misc  Use to test blood sugar 3 times daily. Dx code: 250.00.     losartan 100 MG tablet  Commonly known as:  COZAAR  Take 0.5 tablets (50 mg total) by mouth daily.     metFORMIN 500 MG tablet  Commonly known as:  GLUCOPHAGE  Take 1 tablet (500 mg total) by mouth 2 (two) times daily with a meal.  nitroGLYCERIN 0.4 MG SL tablet  Commonly known as:  NITROSTAT  Place 1 tablet (0.4 mg total) under the tongue every 5 (five) minutes as needed.     pantoprazole 40 MG tablet  Commonly known as:  PROTONIX  Take 40 mg by mouth 2 (two) times daily with a meal.     pravastatin 40 MG tablet  Commonly known as:  PRAVACHOL  Take 40 mg by mouth every evening.     ranolazine 1000 MG SR tablet  Commonly known as:  RANEXA  Take 1 tablet (1,000 mg total) by mouth 2 (two) times daily.     vitamin E 1000 UNIT capsule  Take 1,000 Units by mouth 2 (two) times daily.     zolpidem 10 MG tablet  Commonly known as:  AMBIEN  Take 0.5 tablets (5 mg total) by mouth at bedtime as needed. For insomnia       Allergies  Allergen Reactions  . Ciprocin-Fluocin-Procin (Fluocinolone Acetonide) Rash  . Clarithromycin Itching and Other (See Comments)    "Biaxin" Eyes itch and burn  . Glimepiride (Amaryl) Other (See Comments)    Elevates liver function       The results of significant diagnostics from this hospitalization (including imaging, microbiology, ancillary and laboratory) are listed below for reference.    Significant Diagnostic Studies: Dg Chest 2 View  03/13/2013    IMPRESSION: No active cardiopulmonary disease.   Original Report Authenticated By: Dereck Ligas, M.D.    Microbiology: Recent Results (from the past 240 hour(s))  MRSA PCR SCREENING     Status: None   Collection Time    03/13/13  6:27 AM      Result Value Range  Status   MRSA by PCR NEGATIVE  NEGATIVE Final   Comment:            The GeneXpert MRSA Assay (FDA     approved for NASAL specimens     only), is one component of a     comprehensive MRSA colonization     surveillance program. It is not     intended to diagnose MRSA     infection nor to guide or     monitor treatment for     MRSA infections.     Labs: Basic Metabolic Panel:  Recent Labs Lab 03/13/13 0243 03/14/13 0420  NA 134* 135  K 4.4 4.3  CL 99 102  CO2 25 22  GLUCOSE 435* 298*  BUN 27* 21  CREATININE 1.72* 1.63*  CALCIUM 9.3 8.7   Liver Function Tests:  Recent Labs Lab 03/13/13 0243  AST 13  ALT 8  ALKPHOS 115  BILITOT 0.3  PROT 7.7  ALBUMIN 4.0   CBC:  Recent Labs Lab 03/13/13 0243 03/14/13 0420  WBC 5.6 6.8  NEUTROABS 3.1  --   HGB 13.1 12.7*  HCT 38.7* 37.2*  MCV 83.6 83.4  PLT 191 198   Cardiac Enzymes:  Recent Labs Lab 03/13/13 0244 03/13/13 1000 03/13/13 1621 03/14/13 0420  TROPONINI <0.30 <0.30 <0.30 <0.30   BNP: BNP (last 3 results)  Recent Labs  10/31/12 0956  PROBNP 156.7*   CBG:  Recent Labs Lab 03/13/13 1218 03/13/13 1649 03/13/13 2032 03/14/13 0727 03/14/13 1142  GLUCAP 172* 251* 214* 265* 217*       Signed:  Jordie Skalsky K  [facility] 03/14/2013, 3:20 PM

## 2013-03-14 NOTE — Care Management Note (Signed)
Cm consulted concerning patient's inability to afford current DM regimen. MD consulted CM concerning changing insulin to 70/30. CM spoke with [facility] who has quoted the price of 70/30 at 24.98. Cm informed MD. No other barriers identified.   Arlean Hopping [phone]

## 2013-03-17 ENCOUNTER — Telehealth: Payer: Self-pay | Admitting: *Deleted

## 2013-03-17 NOTE — Telephone Encounter (Signed)
Patient stopped by asking for samples of Ranexa. We provided him with a 4 week supply of 1000mg  tablets.

## 2013-04-01 ENCOUNTER — Ambulatory Visit (INDEPENDENT_AMBULATORY_CARE_PROVIDER_SITE_OTHER): Payer: Medicare PPO | Admitting: Cardiology

## 2013-04-01 ENCOUNTER — Encounter: Payer: Self-pay | Admitting: Cardiology

## 2013-04-01 ENCOUNTER — Encounter: Payer: Self-pay | Admitting: *Deleted

## 2013-04-01 VITALS — BP 138/80 | HR 80 | Ht 65.0 in | Wt 221.0 lb

## 2013-04-01 DIAGNOSIS — Q613 Polycystic kidney, unspecified: Secondary | ICD-10-CM

## 2013-04-01 DIAGNOSIS — I2510 Atherosclerotic heart disease of native coronary artery without angina pectoris: Secondary | ICD-10-CM

## 2013-04-01 LAB — LDL CHOLESTEROL, DIRECT: Direct LDL: 56.4 mg/dL

## 2013-04-01 LAB — LIPID PANEL
Cholesterol: 146 mg/dL (ref 0–200)
Total CHOL/HDL Ratio: 4
VLDL: 76.4 mg/dL — ABNORMAL HIGH (ref 0.0–40.0)

## 2013-04-01 NOTE — Patient Instructions (Addendum)
Your physician recommends that you have a lipid profile today.  Your physician recommends that you schedule a follow-up appointment in: 3 months with Dr Aundra Dubin.

## 2013-04-02 ENCOUNTER — Other Ambulatory Visit: Payer: Self-pay | Admitting: Family Medicine

## 2013-04-02 NOTE — Progress Notes (Signed)
Patient ID: Matthew Rowe., male   DOB: March 23, 1961, 52 y.o.   MRN: [id number] PCP: Dr. Lorelei Pont  52 yo with history CAD s/p CABG (RIMA to RCA, anomalous RCA off left cusp), occlusion of the distal LAD, and chronic angina presents for cardiology followup.  Patient has had chronic angina for years. In 1/14, he developed progressive angina and was hospitalized.  LHC was done, showing unchanged anatomy.  RIMA-RCA was patent.  He did well after this on ranolazine.  In 4/14, he ran out of ranolazine and the chest pain returned. He was admitted overnight in 4/14 and ruled out for MI.  In 7/14, he had again run low on ranolazine and developed more chest pain.  He was admitted again and ruled out for MI.  Currently, he is back on ranolazine at 1000 mg bid.    Ranolazine 1000 mg bid seems to help a lot.  He still has some chest pain with heavier exertion such as walking > 1 block, but on ranolazine most pain has resolved.   Stable exertional dyspnea with heavier exertion such as climbing stairs.    Labs (7/13): K 4.9, creatinine 1.75, LDL 63 Labs (1/14): K 4.2, creatinine 1.65, TGs 483 (could not calculate LDL) Labs (4/14): K 4.8, creatinine 1.62, HCT 36.8 Labs (7/14): K 4.3, creatinine 1.63  ECG: NSR, poor anterior R wave progression, QTc 450 msec.   PMH: 1. CAD: CABG 2006 at [facility] with [facility].  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  Patient has had prior extensive PCI to LAD with occlusion of the distal LAD.  LHC (7/12): proximal LAD stent, extensive mid-distal LAD stenting, 60-70% mid LAD instent restenosis, subtotal distal LAD instent restenosis, LCFx without significant disease, anomalous RCA off left cusp with mild disease, patent RIMA-RCA.  Echo (3/12): EF 55-60%, trivial AI, mild LVH.  Lexiscan myoview (9/13) with small apical scar, moderate inferior scar from base to apex with some reversibility, EF 47% with apical hypokinesis (similar to prior study).  Chronic  angina.  LHC (1/14): total occlusion of distal LAD, diffuse disease in ramus, 70% proximal LCx, 50% proximal and mid RCA, 50% distal RCA, patent RIMA-RCA.  2. OSA: CPAP. 3. Type II diabetes 4. Hyperlipidemia: had side effects from Lipitor.  5. HTN 6. CKD: polycystic kidney disease.  7. Ischemic cardiomyopathy: EF 55-60% by echo in 3/12, EF 47% with apical hypokinesis in 9/13.   SH: Nonsmoker.  Married, lives in [place].  On disability.   FH: Multiple family members with MIs.   ROS: All systems reviewed and negative except as per HPI.   Current Outpatient Prescriptions  Medication Sig Dispense Refill  . Alcohol Swabs PADS Use to test blood sugar 3 times daily. Dx code 250.00  300 each  1  . amLODipine (NORVASC) 10 MG tablet Take 10 mg by mouth daily.      Marland Kitchen aspirin 325 MG tablet Take 325 mg by mouth daily.       . Blood Glucose Monitoring Suppl DEVI Use to test blood sugar 3 times daily. Dx code 250.00.  1 each  0  . carvedilol (COREG) 25 MG tablet TAKE 1 AND 1/2 TABLETS TWICE DAILY  WITH  A  MEAL  135 tablet  0  . gabapentin (NEURONTIN) 300 MG capsule Take 300 mg by mouth 3 (three) times daily.      Marland Kitchen glucose blood test strip Use to test blood sugar 3 times daily. Dx code: 250.00  300 each  1  . hydrochlorothiazide (HYDRODIURIL) 25 MG tablet Take 1 tablet (25 mg total) by mouth daily as needed. For blood pressure  90 tablet  0  . insulin NPH-regular (NOVOLIN 70/30) (70-30) 100 UNIT/ML injection Inject 6 Units into the skin 2 (two) times daily with a meal.  10 mL  12  . Insulin Pen Needle 31G X 5 MM MISC Use one daily for insulin injection  100 each  6  . isosorbide mononitrate (IMDUR) 60 MG 24 hr tablet TAKE 2 TABLETS EVERY DAY  180 tablet  0  . Lancets MISC Use to test blood sugar 3 times daily. Dx code: 250.00.  300 each  1  . losartan (COZAAR) 100 MG tablet Take 0.5 tablets (50 mg total) by mouth daily.  45 tablet  0  . nitroGLYCERIN (NITROSTAT) 0.4 MG SL tablet Place 1 tablet (0.4  mg total) under the tongue every 5 (five) minutes as needed.  100 tablet  1  . pantoprazole (PROTONIX) 40 MG tablet Take 40 mg by mouth 2 (two) times daily with a meal.      . pravastatin (PRAVACHOL) 40 MG tablet Take 40 mg by mouth every evening.      . ranolazine (RANEXA) 1000 MG SR tablet Take 1 tablet (1,000 mg total) by mouth 2 (two) times daily.  60 tablet  11  . vitamin E 1000 UNIT capsule Take 1,000 Units by mouth 2 (two) times daily.        Marland Kitchen zolpidem (AMBIEN) 10 MG tablet Take 0.5 tablets (5 mg total) by mouth at bedtime as needed. For insomnia  45 tablet  0  . metFORMIN (GLUCOPHAGE) 500 MG tablet TAKE 1 TABLET TWICE DAILY  WITH  A  MEAL  180 tablet  0   No current facility-administered medications for this visit.    BP 138/80  Pulse 80  Ht 5\' 5"  (1.651 m)  Wt 100.245 kg (221 lb)  BMI 36.78 kg/m2 General: NAD, obese.  Neck: No JVD, no thyromegaly or thyroid nodule.  Lungs: Clear to auscultation bilaterally with normal respiratory effort. CV: Nondisplaced PMI.  Heart regular S1/S2, no S3/S4, no murmur.  No peripheral edema.  No carotid bruit.  Normal pedal pulses.  Abdomen: Soft, nontender, no hepatosplenomegaly, no distention.  Neurologic: Alert and oriented x 3.  Psych: Normal affect. Extremities: No clubbing or cyanosis.   Assessment/Plan: 1. CAD: Chronic angina.  Much improved with increase in ranolazine to 1000 mg bid.  He has been having a hard time getting ranolazine.  I will send a letter to [facility] asking them to provide this medication given his intractable angina otherwise.  ECG today showed normal QT interval. LHC this year showed stable anatomy.  He will continue ASA 81, statin, Coreg, amlodipine, ARB, and Imdur.  For angina, would continue current doses of Coreg, amlodipine, ranolazine, and Imdur.  2. HTN: BP controlled.  3. Hyperlipidemia: Check lipids/LFTs on pravastatin.  4. CKD: Patient has APKD.  Creatinine stable when checked earlier this month.   [name] 04/02/2013

## 2013-04-06 ENCOUNTER — Ambulatory Visit: Payer: Medicare PPO | Admitting: Cardiology

## 2013-04-09 ENCOUNTER — Observation Stay (HOSPITAL_COMMUNITY)
Admission: EM | Admit: 2013-04-09 | Discharge: 2013-04-09 | DRG: 303 | Disposition: A | Discharge status: Home / Self Care | Payer: Medicare PPO | Attending: Internal Medicine | Admitting: Internal Medicine

## 2013-04-09 ENCOUNTER — Emergency Department (HOSPITAL_COMMUNITY): Payer: Medicare PPO

## 2013-04-09 ENCOUNTER — Encounter (HOSPITAL_COMMUNITY): Payer: Self-pay

## 2013-04-09 DIAGNOSIS — N289 Disorder of kidney and ureter, unspecified: Secondary | ICD-10-CM

## 2013-04-09 DIAGNOSIS — E669 Obesity, unspecified: Secondary | ICD-10-CM | POA: Diagnosis present

## 2013-04-09 DIAGNOSIS — I2510 Atherosclerotic heart disease of native coronary artery without angina pectoris: Principal | ICD-10-CM | POA: Insufficient documentation

## 2013-04-09 DIAGNOSIS — I209 Angina pectoris, unspecified: Secondary | ICD-10-CM

## 2013-04-09 DIAGNOSIS — IMO0002 Reserved for concepts with insufficient information to code with codable children: Secondary | ICD-10-CM | POA: Insufficient documentation

## 2013-04-09 DIAGNOSIS — E1169 Type 2 diabetes mellitus with other specified complication: Secondary | ICD-10-CM | POA: Diagnosis present

## 2013-04-09 DIAGNOSIS — E1159 Type 2 diabetes mellitus with other circulatory complications: Secondary | ICD-10-CM | POA: Diagnosis present

## 2013-04-09 DIAGNOSIS — E1165 Type 2 diabetes mellitus with hyperglycemia: Secondary | ICD-10-CM

## 2013-04-09 DIAGNOSIS — I200 Unstable angina: Secondary | ICD-10-CM | POA: Insufficient documentation

## 2013-04-09 DIAGNOSIS — I129 Hypertensive chronic kidney disease with stage 1 through stage 4 chronic kidney disease, or unspecified chronic kidney disease: Secondary | ICD-10-CM | POA: Insufficient documentation

## 2013-04-09 DIAGNOSIS — Z794 Long term (current) use of insulin: Secondary | ICD-10-CM | POA: Insufficient documentation

## 2013-04-09 DIAGNOSIS — E785 Hyperlipidemia, unspecified: Secondary | ICD-10-CM | POA: Diagnosis present

## 2013-04-09 DIAGNOSIS — I10 Essential (primary) hypertension: Secondary | ICD-10-CM | POA: Diagnosis present

## 2013-04-09 DIAGNOSIS — E118 Type 2 diabetes mellitus with unspecified complications: Secondary | ICD-10-CM | POA: Insufficient documentation

## 2013-04-09 DIAGNOSIS — N183 Chronic kidney disease, stage 3 unspecified: Secondary | ICD-10-CM | POA: Insufficient documentation

## 2013-04-09 DIAGNOSIS — Z79899 Other long term (current) drug therapy: Secondary | ICD-10-CM | POA: Insufficient documentation

## 2013-04-09 DIAGNOSIS — R0789 Other chest pain: Secondary | ICD-10-CM | POA: Insufficient documentation

## 2013-04-09 LAB — CBC
Hemoglobin: 12.7 g/dL — ABNORMAL LOW (ref 13.0–17.0)
MCH: 29.5 pg (ref 26.0–34.0)
Platelets: 204 10*3/uL (ref 150–400)
RBC: 4.3 MIL/uL (ref 4.22–5.81)
WBC: 7.5 10*3/uL (ref 4.0–10.5)

## 2013-04-09 LAB — POCT I-STAT TROPONIN I: Troponin i, poc: 0 ng/mL (ref 0.00–0.08)

## 2013-04-09 LAB — GLUCOSE, CAPILLARY: Glucose-Capillary: 198 mg/dL — ABNORMAL HIGH (ref 70–99)

## 2013-04-09 LAB — TROPONIN I: Troponin I: <0.3 ng/mL (ref <0.30)

## 2013-04-09 LAB — BASIC METABOLIC PANEL
CO2: 20 mEq/L (ref 19–32)
Calcium: 9.5 mg/dL (ref 8.4–10.5)
Chloride: 108 mEq/L (ref 96–112)
Potassium: 4.9 mEq/L (ref 3.5–5.1)
Sodium: 139 mEq/L (ref 135–145)

## 2013-04-09 LAB — PRO B NATRIURETIC PEPTIDE: Pro B Natriuretic peptide (BNP): 258.9 pg/mL — ABNORMAL HIGH (ref 0–125)

## 2013-04-09 MED ORDER — RANOLAZINE ER 500 MG PO TB12
1000.0000 mg | ORAL_TABLET | Freq: Two times a day (BID) | ORAL | Status: DC
  Administered 2013-04-09: 1000 mg via ORAL
  Filled 2013-04-09 (×2): qty 2

## 2013-04-09 MED ORDER — ISOSORBIDE MONONITRATE ER 60 MG PO TB24
90.0000 mg | ORAL_TABLET | Freq: Every day | ORAL | Status: DC
  Administered 2013-04-09: 90 mg via ORAL
  Filled 2013-04-09: qty 1

## 2013-04-09 MED ORDER — ACETAMINOPHEN 325 MG PO TABS: 650.0000 mg | ORAL_TABLET | ORAL | Status: DC | PRN

## 2013-04-09 MED ORDER — CARVEDILOL 25 MG PO TABS
37.5000 mg | ORAL_TABLET | Freq: Two times a day (BID) | ORAL | Status: DC
  Administered 2013-04-09: 37.5 mg via ORAL
  Filled 2013-04-09 (×2): qty 1

## 2013-04-09 MED ORDER — GABAPENTIN 300 MG PO CAPS
300.0000 mg | ORAL_CAPSULE | Freq: Three times a day (TID) | ORAL | Status: DC
  Administered 2013-04-09: 300 mg via ORAL
  Filled 2013-04-09 (×2): qty 1

## 2013-04-09 MED ORDER — NITROGLYCERIN 0.4 MG SL SUBL: 0.4000 mg | SUBLINGUAL_TABLET | SUBLINGUAL | Status: DC | PRN

## 2013-04-09 MED ORDER — NITROGLYCERIN 2 % TD OINT
1.0000 [in_us] | TOPICAL_OINTMENT | Freq: Four times a day (QID) | TRANSDERMAL | Status: DC
  Administered 2013-04-09: 1 [in_us] via TOPICAL
  Filled 2013-04-09: qty 30

## 2013-04-09 MED ORDER — HEPARIN (PORCINE) IN NACL 100-0.45 UNIT/ML-% IJ SOLN
1200.0000 [IU]/h | INTRAMUSCULAR | Status: DC
  Administered 2013-04-09: 1200 [IU]/h via INTRAVENOUS
  Filled 2013-04-09 (×2): qty 250

## 2013-04-09 MED ORDER — ISOSORBIDE MONONITRATE ER 30 MG PO TB24: 90.0000 mg | ORAL_TABLET | Freq: Every day | ORAL | Status: DC

## 2013-04-09 MED ORDER — AMLODIPINE BESYLATE 10 MG PO TABS
10.0000 mg | ORAL_TABLET | Freq: Every day | ORAL | Status: DC
  Administered 2013-04-09: 10 mg via ORAL
  Filled 2013-04-09: qty 1

## 2013-04-09 MED ORDER — LOSARTAN POTASSIUM 50 MG PO TABS
50.0000 mg | ORAL_TABLET | Freq: Every day | ORAL | Status: DC
  Administered 2013-04-09: 50 mg via ORAL
  Filled 2013-04-09: qty 1

## 2013-04-09 MED ORDER — VITAMIN E 45 MG (100 UNIT) PO CAPS
1000.0000 [IU] | ORAL_CAPSULE | Freq: Two times a day (BID) | ORAL | Status: DC
  Administered 2013-04-09: 1000 [IU] via ORAL
  Filled 2013-04-09 (×2): qty 2

## 2013-04-09 MED ORDER — INSULIN ASPART 100 UNIT/ML ~~LOC~~ SOLN
0.0000 [IU] | Freq: Three times a day (TID) | SUBCUTANEOUS | Status: DC
  Administered 2013-04-09 (×2): 3 [IU] via SUBCUTANEOUS

## 2013-04-09 MED ORDER — ONDANSETRON HCL 4 MG/2ML IJ SOLN: 4.0000 mg | Freq: Three times a day (TID) | INTRAMUSCULAR | Status: DC | PRN

## 2013-04-09 MED ORDER — PANTOPRAZOLE SODIUM 40 MG PO TBEC
40.0000 mg | DELAYED_RELEASE_TABLET | Freq: Two times a day (BID) | ORAL | Status: DC
  Administered 2013-04-09: 40 mg via ORAL
  Filled 2013-04-09: qty 1

## 2013-04-09 MED ORDER — ASPIRIN 325 MG PO TABS: 325.0000 mg | ORAL_TABLET | Freq: Every day | ORAL | Status: DC

## 2013-04-09 MED ORDER — MORPHINE SULFATE 2 MG/ML IJ SOLN: 2.0000 mg | INTRAMUSCULAR | Status: DC | PRN

## 2013-04-09 MED ORDER — INSULIN ASPART PROT & ASPART (70-30 MIX) 100 UNIT/ML ~~LOC~~ SUSP: 8.0000 [IU] | Freq: Two times a day (BID) | SUBCUTANEOUS | Status: DC

## 2013-04-09 MED ORDER — HEPARIN BOLUS VIA INFUSION
4000.0000 [IU] | Freq: Once | INTRAVENOUS | Status: AC
  Administered 2013-04-09: 4000 [IU] via INTRAVENOUS

## 2013-04-09 MED ORDER — ZOLPIDEM TARTRATE 5 MG PO TABS: 5.0000 mg | ORAL_TABLET | Freq: Every evening | ORAL | Status: DC | PRN

## 2013-04-09 MED ORDER — SIMVASTATIN 20 MG PO TABS
20.0000 mg | ORAL_TABLET | Freq: Every day | ORAL | Status: DC
  Administered 2013-04-09: 20 mg via ORAL
  Filled 2013-04-09: qty 1

## 2013-04-09 MED ORDER — ASPIRIN 81 MG PO CHEW
324.0000 mg | CHEWABLE_TABLET | Freq: Once | ORAL | Status: AC
  Administered 2013-04-09: 324 mg via ORAL
  Filled 2013-04-09: qty 4

## 2013-04-09 MED ORDER — ONDANSETRON HCL 4 MG/2ML IJ SOLN: 4.0000 mg | Freq: Four times a day (QID) | INTRAMUSCULAR | Status: DC | PRN

## 2013-04-09 MED ORDER — ISOSORBIDE MONONITRATE ER 60 MG PO TB24
120.0000 mg | ORAL_TABLET | Freq: Every day | ORAL | Status: DC
  Filled 2013-04-09: qty 2

## 2013-04-09 MED ORDER — NITROGLYCERIN 2 % TD OINT
1.0000 [in_us] | TOPICAL_OINTMENT | Freq: Once | TRANSDERMAL | Status: AC
  Administered 2013-04-09: 1 [in_us] via TOPICAL
  Filled 2013-04-09: qty 1

## 2013-04-09 NOTE — Discharge Summary (Addendum)
Physician Discharge Summary  Matthew Rowe [id number] DOB: [birth year] DOA: 04/09/2013  PCP: Lamar Blinks, MD  Admit date: 04/09/2013 Discharge date: 04/09/2013  Time spent: 20 minutes  Recommendations for Outpatient Follow-up:  1. Needs follow up with PCP . Recommend to schedule f/up either tomorrow or within a week to address insulin requirement and cost issues. Patient would like to discuss with is PCP about this.  2. Follow up with cardiology in 1 week  Discharge Diagnoses:  Unstable angina  Active problems  uncontrolled DM  CKD stage 3  HTN   HPI  please refer to admission H&P from this morning for details, in brief, 52 y/o obese male with hx of CAD s/p CABG with chronic angina, uncontrolled DM, CKD present with anginal symptoms. Admitted to telemetry on heparin drip.     hospital course   Chronic Unstable angina Has chronic angina. Took 6 s/l nitro and 6 full dose ASA at home. Currently chest pain free. placed on heparin drip. 2 sets of troponin negative. EKG unremarkable. Symptoms now resolved. Seen by cardiology and recommend to increase dose of imdur to 90 mg daily. recommend that  2 sets of troponin negative and prolonged symptoms, ACS is unlikely.  Continue ASA, coreg, ranexa and losartan. F/up with Dr Aundra Dubin in 1 week   uncontrolled DM  A1C >11. Pt on metformin but given CKD with cr of 2 , he cannot be continued on metformin. He insists that he must be on something. Cost of medications continue to be a challenge for him. Cannot place him on sulfonylurea due issues with transaminitis on amaryl in past.  Patient prescribed humulin 70/30 on last discharge but did not fill them. Initially told me they were too expensive. When i told him they were available for 425 at [facility] and would cost him about $12 / month, he say he is too afraid of the needle and cannot take anything except lantus pens.  He would likely to talk to his PCP about other options and i  have asked him to follow up in the office as early as tomorrow.  i have instructed him clearly that metformin is contraindicated given his renal dysfunction.   Active Problems:    Coronary artery disease,  continue ASA, coreg, losartan and statin. Continue ranexa. Dose of imdur increased to 90 mg daily      HTN (hypertension) Resume meds. D/c hCTZ given worsening renal fn    CKD (chronic kidney disease), stage III Cr of 2. He follows with [facility] and i have asked him to make a follow up in few weeks     Discharge Condition: fair  Diet recommendation: diabetic  Filed Weights   04/09/13 0524 04/09/13 0927  Weight: 100.245 kg (221 lb) 100.245 kg (221 lb)       Discharge Exam: Filed Vitals:   04/09/13 0858 04/09/13 0927 04/09/13 1142 04/09/13 1347  BP: 157/92 147/87 154/99 137/87  Pulse:    79  Temp:  98 F (36.7 C)  98.6 F (37 C)  TempSrc:      Resp: 22 20  20   Height:  5\' 5"  (1.651 m)    Weight:  100.245 kg (221 lb)    SpO2: 98% 93%  95%      Discharge Instructions     Medication List    STOP taking these medications       hydrochlorothiazide 25 MG tablet  Commonly known as:  HYDRODIURIL     metFORMIN  500 MG tablet  Commonly known as:  GLUCOPHAGE      TAKE these medications       acetaminophen 325 MG tablet  Commonly known as:  TYLENOL  Take 975 mg by mouth every 6 (six) hours as needed for pain.     Alcohol Swabs Pads  Use to test blood sugar 3 times daily. Dx code 250.00     amLODipine 10 MG tablet  Commonly known as:  NORVASC  Take 10 mg by mouth daily.     aspirin 325 MG tablet  Take 325 mg by mouth daily.     Blood Glucose Monitoring Suppl Devi  Use to test blood sugar 3 times daily. Dx code 250.00.     carvedilol 25 MG tablet  Commonly known as:  COREG  Take 37.5 mg by mouth 2 (two) times daily with a meal.     gabapentin 300 MG capsule  Commonly known as:  NEURONTIN  Take 300 mg by mouth 3 (three) times daily.      glucose blood test strip  Use to test blood sugar 3 times daily. Dx code: 250.00     insulin aspart protamine- aspart (70-30) 100 UNIT/ML injection  Commonly known as:  NOVOLOG MIX 70/30  Inject 0.08 mLs (8 Units total) into the skin 2 (two) times daily with a meal.     Insulin Pen Needle 31G X 5 MM Misc  Use one daily for insulin injection     isosorbide mononitrate 30 MG 24 hr tablet  Commonly known as:  IMDUR  Take 3 tablets (90 mg total) by mouth daily.     Lancets Misc  Use to test blood sugar 3 times daily. Dx code: 250.00.     losartan 100 MG tablet  Commonly known as:  COZAAR  Take 0.5 tablets (50 mg total) by mouth daily.     nitroGLYCERIN 0.4 MG SL tablet  Commonly known as:  NITROSTAT  Place 1 tablet (0.4 mg total) under the tongue every 5 (five) minutes as needed.     pantoprazole 40 MG tablet  Commonly known as:  PROTONIX  Take 40 mg by mouth 2 (two) times daily with a meal.     pravastatin 40 MG tablet  Commonly known as:  PRAVACHOL  Take 40 mg by mouth every evening.     ranolazine 1000 MG SR tablet  Commonly known as:  RANEXA  Take 1 tablet (1,000 mg total) by mouth 2 (two) times daily.     vitamin E 1000 UNIT capsule  Take 1,000 Units by mouth 2 (two) times daily.     zolpidem 10 MG tablet  Commonly known as:  AMBIEN  Take 0.5 tablets (5 mg total) by mouth at bedtime as needed. For insomnia       Allergies  Allergen Reactions  . Ciprocin-Fluocin-Procin (Fluocinolone Acetonide) Rash  . Clarithromycin Itching and Other (See Comments)    "Biaxin" Eyes itch and burn  . Glimepiride (Amaryl) Other (See Comments)    Elevates liver function        Follow-up Information   Follow up with COPLAND,JESSICA, MD In 1 week. (please schedule appt ASAP for outpt dibatic medications)    Contact information:   [place] [id number] [phone]       Follow up with [name], MD In 1 week.   Contact information:   [id number] [place] [address] [phone]  The results of significant diagnostics from this hospitalization (including imaging, microbiology, ancillary and laboratory) are listed below for reference.    Significant Diagnostic Studies: Dg Chest 2 View  03/13/2013   *RADIOLOGY REPORT*  Clinical Data: Short of breath.  CHEST - 2 VIEW  Comparison: 12/15/2012.  Findings:  Cardiopericardial silhouette within normal limits. Mediastinal contours normal. Trachea midline.  No airspace disease or effusion.  Median sternotomy wires are present.  Residual epicardial pacing leads can be seen on the lateral view.  No airspace disease.  There appears to be a long segment left anterior descending coronary artery stent.  IMPRESSION: No active cardiopulmonary disease.   Original Report Authenticated By: Dereck Ligas, M.D.   Dg Chest Port 1 View  04/09/2013   *RADIOLOGY REPORT*  Clinical Data: Chest pain.  Short of breath.  PORTABLE CHEST - 1 VIEW  Comparison: 03/13/2013.  Findings: Exam is under penetrated due to portable technique and body habitus.  Cardiopericardial silhouette appears within normal limits.  No gross consolidation.  No airspace disease or pleural effusion identified.  Allowing for technical differences, there is no interval change compared to the prior examination.  If further evaluation is warranted, consider departmental PA and lateral chest radiograph.  IMPRESSION: No gross acute cardiopulmonary disease.   Original Report Authenticated By: Dereck Ligas, M.D.    Microbiology: No results found for this or any previous visit (from the past 240 hour(s)).   Labs: Basic Metabolic Panel:  Recent Labs Lab 04/09/13 0505  NA 139  K 4.9  CL 108  CO2 20  GLUCOSE 194*  BUN 31*  CREATININE 2.01*  CALCIUM 9.5   Liver Function Tests: No results found for this basename: AST, ALT, ALKPHOS, BILITOT, PROT, ALBUMIN,  in the last 168 hours No results found  for this basename: LIPASE, AMYLASE,  in the last 168 hours No results found for this basename: AMMONIA,  in the last 168 hours CBC:  Recent Labs Lab 04/09/13 0505  WBC 7.5  HGB 12.7*  HCT 36.8*  MCV 85.6  PLT 204   Cardiac Enzymes:  Recent Labs Lab 04/09/13 0812 04/09/13 1425  TROPONINI <0.30 <0.30   BNP: BNP (last 3 results)  Recent Labs  10/31/12 0956 04/09/13 0505  PROBNP 156.7* 258.9*   CBG:  Recent Labs Lab 04/09/13 1136  GLUCAP 171*       Signed:  Jassica Zazueta  [facility] 04/09/2013, 4:37 PM

## 2013-04-09 NOTE — Progress Notes (Signed)
ANTICOAGULATION CONSULT NOTE - Follow Up Consult  Pharmacy Consult:  Heparin Indication:  ACS  Allergies  Allergen Reactions  . Ciprocin-Fluocin-Procin (Fluocinolone Acetonide) Rash  . Clarithromycin Itching and Other (See Comments)    "Biaxin" Eyes itch and burn  . Glimepiride (Amaryl) Other (See Comments)    Elevates liver function     Patient Measurements: Height: 5\' 5"  (165.1 cm) Weight: 221 lb (100.245 kg) IBW/kg (Calculated) : 61.5 Heparin Dosing Weight: 84 kg   Vital Signs: Temp: 98.6 F (37 C) (07/31 1347) Temp src: Oral (07/31 0442) BP: 137/87 mmHg (07/31 1347) Pulse Rate: 79 (07/31 1347)  Labs:  Recent Labs  04/09/13 0505 04/09/13 0812 04/09/13 1425  HGB 12.7*  --   --   HCT 36.8*  --   --   PLT 204  --   --   HEPARINUNFRC  --   --  0.47  CREATININE 2.01*  --   --   TROPONINI  --  <0.30 <0.30    Estimated Creatinine Clearance: 46.3 ml/min (by C-G formula based on Cr of 2.01).      Assessment: 62 YOM admitted with chest pain and started on IV heparin.  Heparin level therapeutic; no bleeding reported.  Noted plan to d/c heparin and discharge home if CE remains negative.   Goal of Therapy:  Heparin level 0.3-0.7 units/ml Monitor platelets by anticoagulation protocol: Yes    Plan:  - Continue heparin gtt at 1200 units/hr - Daily HL / CBC    Taniya Dasher D. Mina Marble, PharmD, BCPS Pager:  [phone] 04/09/2013, 4:08 PM

## 2013-04-09 NOTE — Progress Notes (Signed)
Clinical Social Worker (CSW) received an inappropriate referral regarding pt needing assistance for medications. CSW will inform RNCM of referral. [place] signing off. Hunt Oris, MSW, [place]

## 2013-04-09 NOTE — ED Notes (Signed)
Patient presents with midsternal nonradiating CP as well as bilateral leg pain. CP started around 3 am, woke pt out of sleep. Describes as a dull, pressure. Rates 10/10 at its worst. Took Nitro tablets x 4 and Motrin 600 mg. Pain decreased to 7/10.  Also had a similar episode last night around 7p. Pain was relieved within 20 minutes after taking nitro x 4 tablets, neurontin, ranexa and ASA 325 mg. Associated with some mild SOB. Denies any lower extremity edema, abdominal pain, diaphoresis, dizziness, diarrhea or headache.

## 2013-04-09 NOTE — ED Notes (Signed)
Portable CXR at bedside.

## 2013-04-09 NOTE — H&P (Signed)
[facility] History and Physical  Matthew Rowe [id number] DOB: 15-Jan-1961 DOA: 04/09/2013  Referring physician: Dr Roxanne Mins PCP: Lamar Blinks, MD   Chief Complaint: chest pain x 1 day  HPI:  52 y/o male with CAD s/p CABG with recurrent chronic angina symptoms presents with chest pain since last evening. patient has chronic chest pain due to unstable angina on a daily basis usually in the evening. Last evening he had substernal chest pain radiating to his arms and even right leg. He took 6 s/l nitro, 6 full dose ASA and 4 neurontin tablets without relief. Pain was 10/10 in intensity associated with sweating. No SOB or palpitations.     Review of Systems:  Constitutional: Denies fever, chills, diaphoresis, appetite change and fatigue.  HEENT: Denies photophobia, eye pain, redness, hearing loss, ear pain, congestion, sore throat, rhinorrhea, sneezing, mouth sores, trouble swallowing, neck pain, neck stiffness and tinnitus.   Respiratory: Denies SOB, DOE, cough, chest tightness,  and wheezing.   Cardiovascular: Denies chest pain, palpitations and leg swelling.  Gastrointestinal: Denies nausea, vomiting, abdominal pain, diarrhea, constipation, blood in stool and abdominal distention.  Genitourinary: Denies dysuria, urgency, frequency, hematuria, flank pain and difficulty urinating.  Endocrine: Denies: hot or cold intolerance, sweats, changes in hair or nails, polyuria, polydipsia. Musculoskeletal: Denies myalgias, back pain, joint swelling, arthralgias and gait problem.  Skin: Denies pallor, rash and wound.  Neurological: Denies dizziness, seizures, syncope, weakness, light-headedness, numbness and headaches.  Hematological: Denies adenopathy. Easy bruising, personal or family bleeding history  Psychiatric/Behavioral: Denies suicidal ideation, mood changes, confusion, nervousness, sleep disturbance and agitation   Past Medical History  Diagnosis Date  . Chest pain      Chronic  . Dyslipidemia   . Coronary artery disease     a. s/p INF MI 1999=>PCI of RCA;  b.  prior extensive stenting of LAD with subsequent stent occlusion distally;  c.  s/p CABG with RIMA to RCA in 2006;  d.  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 Abnormal stress test     a. 11/11:  Myoview: apical infarct with ? small focus of inducible ischemia dInf wall, and Inf Apex, EF 46%;  b. Myoview 9/13:  mild apical scar with mild reversibility and mod inf scar with mod reverisibility, EF 47% => stable and med Rx cont'd   . Hypertension   . Hx of echocardiogram     a. Echo 3/12:  mild LVH, EF 55-60%, trivial AI  . Acute MI, inferior wall 1999  . Heart attack 2001; 2003; 2006  . Exertional dyspnea     "sometimes" (09/15/2012)  . DM2 (diabetes mellitus, type 2)   . History of blood transfusion ?2011  . GERD (gastroesophageal reflux disease)     "once; came to hospital" (09/15/2012)  . OSA on CPAP   . CKD (chronic kidney disease)   . Polycystic kidney disease    Past Surgical History  Procedure Laterality Date  . Cystectomy  1990's    off face  . Coronary angioplasty with stent placement  1999 / 2002 / 2004 / 2006?  [facility] Cardiac catheterization  2012  . Coronary artery bypass graft  2006    CABG X?; in [place]  . Cardiac catheterization  2013  . Cardiac catheterization  2014    LHC (1/14): total occlusion  of distal LAD, diffuse disease in ramus, 70% proximal LCx, 50% proximal and mid RCA, 50% distal RCA, patent RIMA-RCA.   Social History:  reports that he has never smoked. He has never used smokeless tobacco. He reports that he does not drink alcohol or use illicit drugs.  Allergies  Allergen Reactions  . Ciprocin-Fluocin-Procin (Fluocinolone Acetonide) Rash  . Clarithromycin Itching and Other (See Comments)    "Biaxin" Eyes itch and burn  .  Glimepiride (Amaryl) Other (See Comments)    Elevates liver function     Family History  Problem Relation Age of Onset  . Cancer Mother   . Anemia Mother   . Kidney disease Mother   . Diabetes Father   . Heart disease Father   . Heart attack Father   . Heart failure Father   . Hyperlipidemia Father   . Hypertension Father   . Sudden death Father   . Kidney failure Sister 5  . Heart attack Sister   . Kidney disease Sister     Prior to Admission medications   Medication Sig Start Date End Date Taking? Authorizing Provider  acetaminophen (TYLENOL) 325 MG tablet Take 975 mg by mouth every 6 (six) hours as needed for pain.   Yes Historical Provider, MD  amLODipine (NORVASC) 10 MG tablet Take 10 mg by mouth daily. 12/12/12  Yes Gay Filler Copland, MD  aspirin 325 MG tablet Take 325 mg by mouth daily.    Yes Historical Provider, MD  carvedilol (COREG) 25 MG tablet Take 37.5 mg by mouth 2 (two) times daily with a meal.   Yes Historical Provider, MD  gabapentin (NEURONTIN) 300 MG capsule Take 300 mg by mouth 3 (three) times daily. 12/12/12  Yes Gay Filler Copland, MD  hydrochlorothiazide (HYDRODIURIL) 25 MG tablet Take 25 mg by mouth daily as needed (for high blood pressure). For blood pressure 12/22/12 01/22/14 Yes Jessica C Copland, MD  isosorbide mononitrate (IMDUR) 60 MG 24 hr tablet Take 120 mg by mouth daily.   Yes Historical Provider, MD  losartan (COZAAR) 100 MG tablet Take 0.5 tablets (50 mg total) by mouth daily. 12/22/12  Yes Gay Filler Copland, MD  metFORMIN (GLUCOPHAGE) 500 MG tablet Take 500 mg by mouth 2 (two) times daily with a meal.   Yes Historical Provider, MD  nitroGLYCERIN (NITROSTAT) 0.4 MG SL tablet Place 1 tablet (0.4 mg total) under the tongue every 5 (five) minutes as needed. 12/16/12  Yes Angelica Ran, MD  pantoprazole (PROTONIX) 40 MG tablet Take 40 mg by mouth 2 (two) times daily with a meal. 12/12/12 12/12/13 Yes Jessica C Copland, MD  pravastatin (PRAVACHOL) 40 MG  tablet Take 40 mg by mouth every evening. 12/12/12  Yes Gay Filler Copland, MD  ranolazine (RANEXA) 1000 MG SR tablet Take 1 tablet (1,000 mg total) by mouth 2 (two) times daily. 12/30/12  Yes Larey Dresser, MD  vitamin E 1000 UNIT capsule Take 1,000 Units by mouth 2 (two) times daily.     Yes Historical Provider, MD  zolpidem (AMBIEN) 10 MG tablet Take 0.5 tablets (5 mg total) by mouth at bedtime as needed. For insomnia 03/07/13  Yes Darreld Mclean, MD  Alcohol Swabs PADS Use to test blood sugar 3 times daily. Dx code 250.00 12/12/12   Darreld Mclean, MD  Blood Glucose Monitoring Suppl DEVI Use to test blood sugar 3 times daily. Dx code 250.00. 12/12/12   Gay Filler Copland, MD  glucose blood test strip Use to  test blood sugar 3 times daily. Dx code: 250.00 12/12/12   Darreld Mclean, MD  Insulin Pen Needle 31G X 5 MM MISC Use one daily for insulin injection 12/02/12   Gay Filler Copland, MD  Lancets MISC Use to test blood sugar 3 times daily. Dx code: 250.00. 12/12/12   Darreld Mclean, MD    Physical Exam:  Filed Vitals:   04/09/13 [id number] 04/09/13 0530 04/09/13 0615 04/09/13 0645  BP:  165/91 152/101 150/88  Pulse:  85 76 78  Temp:      TempSrc:      Resp:  30 21 24   Height: 5\' 5"  (1.651 m)     Weight: 100.245 kg (221 lb)     SpO2:  100% 98% 97%    Constitutional: Vital signs reviewed.  Patient is a well-developed and well-nourished in no acute distress and cooperative with exam. Alert and oriented x3.  Head: Normocephalic and atraumatic Ear: TM normal bilaterally Mouth: no erythema or exudates, MMM Eyes: PERRL, EOMI, conjunctivae normal, No scleral icterus.  Neck: Supple, Trachea midline normal ROM, No JVD, mass, thyromegaly, or carotid bruit present.  Cardiovascular: RRR, S1 normal, S2 normal, no MRG, pulses symmetric and intact bilaterally Pulmonary/Chest: CTAB, no wheezes, rales, or rhonchi Abdominal: Soft. Non-tender, non-distended, bowel sounds are normal, no masses, organomegaly,  or guarding present.  GU: no CVA tenderness Musculoskeletal: No joint deformities, erythema, or stiffness, ROM full and no nontender Ext: no edema and no cyanosis, pulses palpable bilaterally (DP and PT) Hematology: no cervical, inginal, or axillary adenopathy.  Neurological: A&O x3, Strenght is normal and symmetric bilaterally, cranial nerve II-XII are grossly intact, no focal motor deficit, sensory intact to light touch bilaterally.  Skin: Warm, dry and intact. No rash, cyanosis, or clubbing.  Psychiatric: Normal mood and affect. speech and behavior is normal. Judgment and thought content normal. Cognition and memory are normal.   Labs on Admission:  Basic Metabolic Panel:  Recent Labs Lab 04/09/13 0505  NA 139  K 4.9  CL 108  CO2 20  GLUCOSE 194*  BUN 31*  CREATININE 2.01*  CALCIUM 9.5   Liver Function Tests: No results found for this basename: AST, ALT, ALKPHOS, BILITOT, PROT, ALBUMIN,  in the last 168 hours No results found for this basename: LIPASE, AMYLASE,  in the last 168 hours No results found for this basename: AMMONIA,  in the last 168 hours CBC:  Recent Labs Lab 04/09/13 0505  WBC 7.5  HGB 12.7*  HCT 36.8*  MCV 85.6  PLT 204   Cardiac Enzymes: No results found for this basename: CKTOTAL, CKMB, CKMBINDEX, TROPONINI,  in the last 168 hours BNP: No components found with this basename: POCBNP,  CBG: No results found for this basename: GLUCAP,  in the last 168 hours  Radiological Exams on Admission: Dg Chest Port 1 View  04/09/2013   *RADIOLOGY REPORT*  Clinical Data: Chest pain.  Short of breath.  PORTABLE CHEST - 1 VIEW  Comparison: 03/13/2013.  Findings: Exam is under penetrated due to portable technique and body habitus.  Cardiopericardial silhouette appears within normal limits.  No gross consolidation.  No airspace disease or pleural effusion identified.  Allowing for technical differences, there is no interval change compared to the prior examination.   If further evaluation is warranted, consider departmental PA and lateral chest radiograph.  IMPRESSION: No gross acute cardiopulmonary disease.   Original Report Authenticated By: Dereck Ligas, M.D.    EKG: NSR@84 , no ST-T changes  Assessment/Plan  Principal Problem:   Unstable angina Currently chest pain free. Reports taking 6 s/l nitro and 6 full due ASA at home along with 4  neurontin tablets.  EKG unchanged. Troponin POC negative.  Stable for admit to telemetry. doesnot need nitro drip at this time. Patient received s/l nitro in ED. Has intermittent chest discomfort.  Started on IV heparin drip which i will continue. Monitor serial troponins x 3.  ASA 325 mg daily. Ordered 1 " nitropaste q6hr. S/l Nitro prn  continue metoprolol, ranexa, coreg, imdur, losartan  And statin. ranexa dose recently increased by cardiology [facility] cardiology called. Will follow with recommendations   Active Problems:    Coronary artery disease,  Hx of CABG. cardiac cath earlier this year.  continue ASA, BB, statin and ACEi. continue ranexa    Type 2 diabetes mellitus Hold metformin. Place on SSI. A1C of 9    HTN (hypertension) Resume home meds    CKD (chronic kidney disease), stage III Seems close to  baseline  hold HCTZ and metformin    Obesity, unspecified  DVT prophylaxis: IV heparin  Diet: diabetic  Code Status: full Family Communication: wife at bedside Disposition Plan: home once stable  Louellen Molder [facility] Pager [phone]  If 7PM-7AM, please contact night-coverage [email or web address]  Total time spent: 70 minutes Password [email or web address] 04/09/2013, 8:33 AM

## 2013-04-09 NOTE — Progress Notes (Deleted)
ANTICOAGULATION CONSULT NOTE - Follow Up Consult  Pharmacy Consult:  Heparin Indication:  ACS  Allergies  Allergen Reactions  . Ciprocin-Fluocin-Procin (Fluocinolone Acetonide) Rash  . Clarithromycin Itching and Other (See Comments)    "Biaxin" Eyes itch and burn  . Glimepiride (Amaryl) Other (See Comments)    Elevates liver function     Patient Measurements: Height: 5\' 5"  (165.1 cm) Weight: 221 lb (100.245 kg) IBW/kg (Calculated) : 61.5 Heparin Dosing Weight: 84 kg   Vital Signs: Temp: 98.6 F (37 C) (07/31 1347) Temp src: Oral (07/31 0442) BP: 137/87 mmHg (07/31 1347) Pulse Rate: 79 (07/31 1347)  Labs:  Recent Labs  04/09/13 0505 04/09/13 0812 04/09/13 1425  HGB 12.7*  --   --   HCT 36.8*  --   --   PLT 204  --   --   HEPARINUNFRC  --   --  0.47  CREATININE 2.01*  --   --   TROPONINI  --  <0.30 <0.30    Estimated Creatinine Clearance: 46.3 ml/min (by C-G formula based on Cr of 2.01).      Assessment: 52 admitted with chest pain and started on IV heparin.  Heparin level therapeutic; no bleeding reported.  Noted plan to d/c heparin and discharge home if CE remains negative.   Goal of Therapy:  Heparin level 0.3-0.7 units/ml Monitor platelets by anticoagulation protocol: Yes    Plan:  - Continue heparin gtt at 1200 units/hr - Daily HL / CBC    Makalynn Berwanger D. Mina Marble, PharmD, BCPS Pager:  [phone] 04/09/2013, 4:04 PM

## 2013-04-09 NOTE — Progress Notes (Signed)
ANTICOAGULATION CONSULT NOTE - Initial Consult  Pharmacy Consult for Heparin Indication: chest pain/ACS  Allergies  Allergen Reactions  . Ciprocin-Fluocin-Procin (Fluocinolone Acetonide) Rash  . Clarithromycin Itching and Other (See Comments)    "Biaxin" Eyes itch and burn  . Glimepiride (Amaryl) Other (See Comments)    Elevates liver function     Patient Measurements: Height: 5\' 5"  (165.1 cm) Weight: 221 lb (100.245 kg) IBW/kg (Calculated) : 61.5 Heparin Dosing Weight:   Vital Signs: Temp: 98.3 F (36.8 C) (07/31 0442) Temp src: Oral (07/31 0442) BP: 163/90 mmHg (07/31 0500) Pulse Rate: 84 (07/31 0500)  Labs: No results found for this basename: HGB, HCT, PLT, APTT, LABPROT, INR, HEPARINUNFRC, CREATININE, CKTOTAL, CKMB, TROPONINI,  in the last 72 hours  Estimated Creatinine Clearance: 57.1 ml/min (by C-G formula based on Cr of 1.63).   Medical History: Past Medical History  Diagnosis Date  . Chest pain     Chronic  . Dyslipidemia   . Coronary artery disease     a. s/p INF MI 1999=>PCI of RCA;  b.  prior extensive stenting of LAD with subsequent stent occlusion distally;  c.  s/p CABG with RIMA to RCA in 2006;  d.  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 Abnormal stress test     a. 11/11:  Myoview: apical infarct with ? small focus of inducible ischemia dInf wall, and Inf Apex, EF 46%;  b. Myoview 9/13:  mild apical scar with mild reversibility and mod inf scar with mod reverisibility, EF 47% => stable and med Rx cont'd   . Hypertension   . Hx of echocardiogram     a. Echo 3/12:  mild LVH, EF 55-60%, trivial AI  . Acute MI, inferior wall 1999  . Heart attack 2001; 2003; 2006  . Exertional dyspnea     "sometimes" (09/15/2012)  . DM2 (diabetes mellitus, type 2)   . History of blood transfusion ?2011  . GERD  (gastroesophageal reflux disease)     "once; came to hospital" (09/15/2012)  . OSA on CPAP   . CKD (chronic kidney disease)   . Polycystic kidney disease     Medications:  ASA  Norvasc Neurontin  HCTZ  Insulin 70/30  Imdur  Cozaar  Ntg  Protonix  Pravachol  Ranexa  Vit E  Ambien  Metformin  Assessment: 52 yo male with chest pain for heparin  Goal of Therapy:  Heparin level 0.3-0.7 units/ml Monitor platelets by anticoagulation protocol: Yes   Plan:  Heparin 4000 units IV bolus, then 1200 units/hr Check heparin level in 6 hours.  Caryl Pina 04/09/2013,5:31 AM

## 2013-04-09 NOTE — ED Notes (Signed)
MD at bedside. 

## 2013-04-09 NOTE — Consult Note (Signed)
CARDIOLOGY CONSULT NOTE  Patient ID: Matthew Kirks Sr. MRN: [id number], DOB/AGE: 53-18-1962   Admit date: 04/09/2013 Date of Consult: 04/09/2013  Primary Physician: Lamar Blinks, MD Primary Cardiologist: Dr. [name]  Pt. Profile  52 y/o male with an extensive cardiac hx. He has had prior PCI to LAD with occlusion of distal LAD and a previous CABG in 2006, RIMA-RCA. Presents to ED with CP unresolved with 6 SL nitroglycerin tablets.   Problem List  Past Medical History  Diagnosis Date  . Chest pain     Chronic  . Dyslipidemia   . Coronary artery disease     a. INF MI 1999: PCI of RCA;  b.  extensive stenting of LAD;  c. s/p CABG with RIMA->RCA in 2006;  d. Stable, Low risk MV 05/2012; e. 09/2012 Cath: LM 40-50d, LAD 30 isr, patent mid stents, 100d, LCX 70p, RI diff dzs, RCA (anomalous take off) 50p, patent RIMA->med rx.  . Hypertension   . Hx of echocardiogram     a. Echo 3/12:  mild LVH, EF 55-60%, trivial AI  . DM2 (diabetes mellitus, type 2)   . History of blood transfusion ?2011  . GERD (gastroesophageal reflux disease)     "once; came to hospital" (09/15/2012)  . OSA on CPAP   . CKD (chronic kidney disease)   . Polycystic kidney disease     Past Surgical History  Procedure Laterality Date  . Cystectomy  1990's    off face  . Coronary angioplasty with stent placement  1999 / 2002 / 2004 / 2006?  Marland Kitchen Cardiac catheterization  2012  . Coronary artery bypass graft  2006    CABG X?; in [place]  . Cardiac catheterization  2013  . Cardiac catheterization  2014    LHC (1/14): total occlusion of distal LAD, diffuse disease in ramus, 70% proximal LCx, 50% proximal and mid RCA, 50% distal RCA, patent RIMA-RCA.    Allergies  Allergies  Allergen Reactions  . Ciprocin-Fluocin-Procin (Fluocinolone Acetonide) Rash  . Clarithromycin Itching and Other (See Comments)    "Biaxin" Eyes itch and burn  . Glimepiride (Amaryl) Other (See Comments)    Elevates liver function     HPI   52 y/o male with history of CAD (s/p CABG 2006 RIMA-RCA and PCI LAD). He also has a history or HTN, chronic stable angina (on ranolazaine), ischemic CM (EF 55-60%) , CKD stage III, polycystic KD, DM2, HLD, OSA (on CPAP) and obesity. He is followed closely by Dr. Sylvester Harder and was last seen in his office 7/24 and was stable. He has had frequent admissions over the last year for CP and last admission ranolazine was increased to 1000 mg BID. Last LHC was 1/14 that showed total occlusion of distal LAD, diffuse disease in ramus, 70% proximal LCx, 50% proximal and mid RCA, 50% distal RCA, patent RIMA-RCA.   He presented to the ED this am with complaints of CP since last evening. He reports that the pain was sub-sternal and radiating to his arms and R leg. He took 6 s/l nitro, 6 full dose ASA and 4 neurontin tablets with no relief. He was started on IV heparin and NL paste was applied. First Troponin nl. SBP 180s upon admission. Reports that the pain coming in was 10/10 and now is 4/10 and is where he lives chronically. Denies missing any medications. Denies any N/V.   Inpatient Medications  . amLODipine  10 mg Oral Daily  . [START ON 04/10/2013] aspirin  325 mg Oral Daily  . carvedilol  37.5 mg Oral BID WC  . gabapentin  300 mg Oral TID  . insulin aspart  0-15 Units Subcutaneous TID WC  . isosorbide mononitrate  90 mg Oral Daily  . losartan  50 mg Oral Daily  . pantoprazole  40 mg Oral BID WC  . ranolazine  1,000 mg Oral BID  . simvastatin  20 mg Oral q1800  . vitamin E  1,000 Units Oral BID   Family History Family History  Problem Relation Age of Onset  . Cancer Mother   . Anemia Mother   . Kidney disease Mother   . Diabetes Father   . Heart disease Father   . Heart attack Father   . Heart failure Father   . Hyperlipidemia Father   . Hypertension Father   . Sudden death Father   . Kidney failure Sister 5  . Heart attack Sister   . Kidney disease Sister     Social History History    Social History  . Marital Status: Married    Spouse Name: N/A    Number of Children: N/A  . Years of Education: N/A   Occupational History  . Not on file.   Social History Main Topics  . Smoking status: Never Smoker   . Smokeless tobacco: Never Used     Comment: smoked some as a teenager (high school)  . Alcohol Use: No     Comment: 09/15/2012 "drank a little when I was young"  . Drug Use: No  . Sexually Active: Yes   Other Topics Concern  . Not on file   Social History Narrative   Married and lives with wife in [place]. On disability.     Review of Systems  General:  No chills, fever, night sweats or weight changes.  Cardiovascular:  +++ chest pain substernal radiating to arms and R Leg, dyspnea on exertion, edema, orthopnea, palpitations, paroxysmal nocturnal dyspnea. Dermatological: No rash, lesions/masses Respiratory: No cough, dyspnea Urologic: No hematuria, dysuria Abdominal:   No nausea, vomiting, diarrhea, bright red blood per rectum, melena, or hematemesis Neurologic:  No visual changes, wkns, changes in mental status. All other systems reviewed and are otherwise negative except as noted above.  Physical Exam  Blood pressure 154/99, pulse 81, temperature 98 F (36.7 C), temperature source Oral, resp. rate 20, height 5\' 5"  (1.651 m), weight 221 lb (100.245 kg), SpO2 93.00%.  General: Pleasant, NAD Psych: Normal affect. Neuro: Alert and oriented X 3. Moves all extremities spontaneously. HEENT: Normal  Neck: Supple without bruits or JVD. Lungs:  Resp regular and unlabored, CTA. Heart: RRR no s3, s4, or murmurs. Abdomen: Soft, non-tender, non-distended, BS + x 4.  Extremities: No clubbing, cyanosis or edema. DP/PT/Radials 2+ and equal bilaterally.  Labs  Recent Labs  04/09/13 0812  TROPONINI <0.30   Lab Results  Component Value Date   WBC 7.5 04/09/2013   HGB 12.7* 04/09/2013   HCT 36.8* 04/09/2013   MCV 85.6 04/09/2013   PLT 204 04/09/2013     Recent  Labs Lab 04/09/13 0505  NA 139  K 4.9  CL 108  CO2 20  BUN 31*  CREATININE 2.01*  CALCIUM 9.5  GLUCOSE 194*   Lab Results  Component Value Date   CHOL 146 04/01/2013   HDL 34.30* 04/01/2013   LDLCALC UNABLE TO CALCULATE IF TRIGLYCERIDE OVER 400 mg/dL 03/14/2013   TRIG 382.0* 04/01/2013   Radiology/Studies  Dg Chest 2 View  03/13/2013   *  RADIOLOGY REPORT*  Clinical Data: Short of breath.  CHEST - 2 VIEW  Comparison: 12/15/2012.  Findings:  Cardiopericardial silhouette within normal limits. Mediastinal contours normal. Trachea midline.  No airspace disease or effusion.  Median sternotomy wires are present.  Residual epicardial pacing leads can be seen on the lateral view.  No airspace disease.  There appears to be a long segment left anterior descending coronary artery stent.  IMPRESSION: No active cardiopulmonary disease.   Original Report Authenticated By: Dereck Ligas, M.D.   Dg Chest Port 1 View  04/09/2013   *RADIOLOGY REPORT*  Clinical Data: Chest pain.  Short of breath.  PORTABLE CHEST - 1 VIEW  Comparison: 03/13/2013.  Findings: Exam is under penetrated due to portable technique and body habitus.  Cardiopericardial silhouette appears within normal limits.  No gross consolidation.  No airspace disease or pleural effusion identified.  Allowing for technical differences, there is no interval change compared to the prior examination.  If further evaluation is warranted, consider departmental PA and lateral chest radiograph.  IMPRESSION: No gross acute cardiopulmonary disease.   Original Report Authenticated By: Dereck Ligas, M.D.   ECG  Rsr, 70, lad, old ant/inf infarcts.  ASSESSMENT AND PLAN  1) CAD/ Chronic angina- 52 y/o male with extensive cardiac history. S/P CABG 2006 RIMA-RCA. LHC 09/2012 occlusion of distal LAD, diffuse disease in ramus, 70% proximal LCx, 50% proximal and mid RCA, 50% distal RCA, patent RIMA-RCA. He is on ranolazine 1000 mg BID, ASA, BB, nitrates, ARB and  statin. First Troponin was  Nl,   and reports he is back to baseline for CP now. SBP 140-150's will stop nitro paste and start Imdur back and increase 90 mg daily. With prolonged Ss and neg CE doubt ischemia though we await a second troponin. If 2nd CEs negative, can d/c heparin and ok from our standpoint for patient to be discharged home with f/u with Dr. Aundra Dubin.    2) HTN-  SBP 150-160's. Continue BB, amlodipine, Imdur and ARB.   3) Hyperlipidemia: Lipids and LFTs nl earlier this month. Continue statin.   4) DM2- SSI and and CBGs  5) Stage III CKD:  Creat up slightly.  Follow.  On ARB.  Signed, Murray Hodgkins, NP 04/09/2013, 1:30 PM Patient seen and examined. I agree with the assessment and plan as detailed above. See also my additional thoughts below.   I have reviewed all the information with Mr. Sharolyn Douglas. I agree with the note above with modifications made by me. The patient has chest pain all of the time. He did become worse. So far there is no enzyme elevation. We know that the patient has areas of potential ischemia that are being watched medically. If the patient's second troponin shows no significant elevation, IV meds can be stopped and he can be allowed to go home. If his enzymes turn positive, he will need to stay for more complete evaluation.  Dola Argyle, MD, [facility] 04/09/2013 2:49 PM

## 2013-04-09 NOTE — ED Provider Notes (Signed)
CSN: [id number]     Arrival date & time 04/09/13  0435 History     First MD Initiated Contact with Patient 04/09/13 0449     Chief Complaint  Patient presents with  . Chest Pain   (Consider location/radiation/quality/duration/timing/severity/associated sxs/prior Treatment) Patient is a 52 y.o. Rowe presenting with chest pain. The history is provided by the patient.  Chest Pain He had onset yesterday morning of a severe left sided chest tightness with radiation to both legs and both arms. He tended to be worse with exertion better with rest. There is associated dyspnea, and and nausea but no diaphoresis. He has taken a variety of things which are given slight, temporary relief. That has included nitroglycerin, Neurontin, aspirin, and ranolazine. Pain is rated at 6/10 currently but has been as severe as 10/10. Similar pain in the past has been felt to be due to unstable angina. He is status post placement of coronary stents and also coronary artery bypass.  Past Medical History  Diagnosis Date  . Chest pain     Chronic  . Dyslipidemia   . Coronary artery disease     a. s/p INF MI 1999=>PCI of RCA;  b.  prior extensive stenting of LAD with subsequent stent occlusion distally;  c.  s/p CABG with RIMA to RCA in 2006;  d.  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 Abnormal stress test     a. 11/11:  Myoview: apical infarct with ? small focus of inducible ischemia dInf wall, and Inf Apex, EF 46%;  b. Myoview 9/13:  mild apical scar with mild reversibility and mod inf scar with mod reverisibility, EF 47% => stable and med Rx cont'd   . Hypertension   . Hx of echocardiogram     a. Echo 3/12:  mild LVH, EF 55-60%, trivial AI  . Acute MI, inferior wall 1999  . Heart attack 2001; 2003; 2006  . Exertional dyspnea     "sometimes" (09/15/2012)  . DM2  (diabetes mellitus, type 2)   . History of blood transfusion ?2011  . GERD (gastroesophageal reflux disease)     "once; came to hospital" (09/15/2012)  . OSA on CPAP   . CKD (chronic kidney disease)   . Polycystic kidney disease    Past Surgical History  Procedure Laterality Date  . Cystectomy  1990's    off face  . Coronary angioplasty with stent placement  1999 / 2002 / 2004 / 2006?  Marland Kitchen Cardiac catheterization  2012  . Coronary artery bypass graft  2006    CABG X?; in [place]  . Cardiac catheterization  2013  . Cardiac catheterization  2014    LHC (1/14): total occlusion of distal LAD, diffuse disease in ramus, 70% proximal LCx, 50% proximal and mid RCA, 50% distal RCA, patent RIMA-RCA.   Family History  Problem Relation Age of Onset  . Cancer Mother   . Anemia Mother   . Kidney disease Mother   . Diabetes Father   . Heart disease Father   . Heart attack Father   . Heart failure Father   . Hyperlipidemia Father   . Hypertension Father   . Sudden death Father   . Kidney failure Sister 5  . Heart attack Sister   . Kidney disease Sister    History  Substance Use Topics  . Smoking status: Never Smoker   . Smokeless tobacco: Never Used     Comment: smoked some as a teenager (high school)  . Alcohol Use: No     Comment: 09/15/2012 "drank a little when I was young"    Review of Systems  Cardiovascular: Positive for chest pain.  All other systems reviewed and are negative.    Allergies  Ciprocin-fluocin-procin; Clarithromycin; and Glimepiride  Home Medications   Current Outpatient Rx  Name  Route  Sig  Dispense  Refill  . Alcohol Swabs PADS      Use to test blood sugar 3 times daily. Dx code 250.00   300 each   1   . amLODipine (NORVASC) 10 MG tablet   Oral   Take 10 mg by mouth daily.         Marland Kitchen aspirin 325 MG tablet   Oral   Take 325 mg by mouth daily.          . Blood Glucose Monitoring Suppl DEVI      Use to test blood sugar 3 times daily. Dx  code 250.00.   1 each   0     PRODIGY AUTOCODE METER KIT   . carvedilol (COREG) 25 MG tablet      TAKE 1 AND 1/2 TABLETS TWICE DAILY  WITH  A  MEAL   135 tablet   0   . gabapentin (NEURONTIN) 300 MG capsule   Oral   Take 300 mg by mouth 3 (three) times daily.         Marland Kitchen glucose blood test strip      Use to test blood sugar 3 times daily. Dx code: 250.00   300 each   1     PRODIGY NO CODING STRIPS   . hydrochlorothiazide (HYDRODIURIL) 25 MG tablet   Oral   Take 1 tablet (25 mg total) by mouth daily as needed. For blood pressure   90 tablet   0   . insulin NPH-regular (NOVOLIN 70/30) (70-30) 100 UNIT/ML injection   Subcutaneous   Inject 6 Units into the skin 2 (two) times daily with a meal.   10 mL   12   . Insulin Pen Needle 31G X 5 MM MISC      Use one daily for insulin injection   100 each   6   . isosorbide mononitrate (IMDUR) 60 MG 24 hr tablet      TAKE 2 TABLETS EVERY DAY   180 tablet   0   . Lancets MISC      Use to test blood sugar 3 times daily. Dx code: 250.00.   300 each   1     PRODIGY TWIST TOP 28G   . losartan (COZAAR) 100 MG tablet   Oral   Take 0.5 tablets (50 mg total) by mouth daily.   45 tablet   0   . metFORMIN (GLUCOPHAGE) 500 MG tablet      TAKE 1 TABLET TWICE DAILY  WITH  A  MEAL   180 tablet   0   . nitroGLYCERIN (NITROSTAT) 0.4 MG SL tablet   Sublingual   Place 1 tablet (0.4 mg total) under the tongue every 5 (five) minutes as needed.   100 tablet   1   . pantoprazole (PROTONIX) 40 MG tablet   Oral   Take 40 mg by mouth 2 (two) times daily with a meal.         .  pravastatin (PRAVACHOL) 40 MG tablet   Oral   Take 40 mg by mouth every evening.         . ranolazine (RANEXA) 1000 MG SR tablet   Oral   Take 1 tablet (1,000 mg total) by mouth 2 (two) times daily.   60 tablet   11   . vitamin E 1000 UNIT capsule   Oral   Take 1,000 Units by mouth 2 (two) times daily.           Marland Kitchen zolpidem (AMBIEN) 10 MG  tablet   Oral   Take 0.5 tablets (5 mg total) by mouth at bedtime as needed. For insomnia   45 tablet   0     [facility] mail order pharmacy- fax WITH COVER SH .Marland KitchenMarland Kitchen    BP 186/104  Temp(Src) 98.3 F (36.8 C) (Oral)  Resp 27  SpO2 98% Physical Exam  Nursing note and vitals reviewed.  52 year old Rowe, resting comfortably and in no acute distress. Vital signs are significant for hypertension with blood pressure 186/104, and tachypnea with respiratory rate of 27. Oxygen saturation is 98 which is normal. Head is normocephalic and atraumatic. PERRLA, EOMI. Oropharynx is clear. Neck is nontender and supple without adenopathy or JVD. Back is nontender and there is no CVA tenderness. Lungs are clear without rales, wheezes, or rhonchi. Chest is nontender. Heart has regular rate and rhythm without murmur. Abdomen is soft, flat, nontender without masses or hepatosplenomegaly and peristalsis is normoactive. Extremities have no cyanosis or edema, full range of motion is present. Skin is warm and dry without rash. Neurologic: Mental status is normal, cranial nerves are intact, there are no motor or sensory deficits.  ED Course   Procedures (including critical care time)  Labs Reviewed  CBC  BASIC METABOLIC PANEL  PRO B NATRIURETIC PEPTIDE   No results found.   Date: 04/09/2013  Rate: 84  Rhythm: normal sinus rhythm  QRS Axis: left  Intervals: normal  ST/T Wave abnormalities: normal  Conduction Disutrbances:left anterior fascicular block  Narrative Interpretation: Left atrial hypertrophy, left anterior fascicular block, old inferior wall myocardial infarction. When compared with ECG of 03/13/2013, no significant changes are seen..  Old EKG Reviewed: unchanged   1. Unstable angina   2. Renal insufficiency     MDM  Chest discomfort worrisome for acute coronary syndrome and unstable angina. EKG does not show any changes but troponin will be checked. He'll be started on nitrates  and heparin. Old records are reviewed and presentation is very similar to a hospitalization earlier this month which was felt to be unstable angina. Cardiac catheterization in January of this year did show several lesions were 60-70% occluded with significant myocardium at risk.   He got complete pain relief with above noted treatment. Initial workup is negative including normal troponin. Case is discussed with Dr. Clementeen Graham of [facility] who agrees to admit the patient.      Delora Fuel, MD [id number]

## 2013-04-28 ENCOUNTER — Telehealth: Payer: Self-pay

## 2013-04-28 NOTE — Telephone Encounter (Signed)
Marveen Reeks nurse with [facility] Rossetti at [facility] left v/m requesting our office call clinical pharmacy review regarding pts Novolog requesting formulary exception. Pts PCP is Dr Janett Billow Copland; notified Jeanine to call [phone];Jeanine voiced understanding.

## 2013-05-18 ENCOUNTER — Ambulatory Visit (INDEPENDENT_AMBULATORY_CARE_PROVIDER_SITE_OTHER): Payer: Medicare HMO | Admitting: Physician Assistant

## 2013-05-18 VITALS — BP 132/82 | HR 82 | Temp 98.7°F | Resp 20 | Ht 66.0 in | Wt 222.0 lb

## 2013-05-18 DIAGNOSIS — I2510 Atherosclerotic heart disease of native coronary artery without angina pectoris: Secondary | ICD-10-CM

## 2013-05-18 DIAGNOSIS — E669 Obesity, unspecified: Secondary | ICD-10-CM

## 2013-05-18 DIAGNOSIS — G4733 Obstructive sleep apnea (adult) (pediatric): Secondary | ICD-10-CM

## 2013-05-18 DIAGNOSIS — E1165 Type 2 diabetes mellitus with hyperglycemia: Secondary | ICD-10-CM

## 2013-05-18 DIAGNOSIS — I209 Angina pectoris, unspecified: Secondary | ICD-10-CM

## 2013-05-18 DIAGNOSIS — I10 Essential (primary) hypertension: Secondary | ICD-10-CM

## 2013-05-18 DIAGNOSIS — Z23 Encounter for immunization: Secondary | ICD-10-CM

## 2013-05-18 DIAGNOSIS — K219 Gastro-esophageal reflux disease without esophagitis: Secondary | ICD-10-CM

## 2013-05-18 DIAGNOSIS — E785 Hyperlipidemia, unspecified: Secondary | ICD-10-CM

## 2013-05-18 MED ORDER — ISOSORBIDE MONONITRATE ER 30 MG PO TB24: 90.0000 mg | ORAL_TABLET | Freq: Every day | ORAL | Status: DC

## 2013-05-18 MED ORDER — NITROGLYCERIN 0.4 MG SL SUBL: 0.4000 mg | SUBLINGUAL_TABLET | SUBLINGUAL | Status: DC | PRN

## 2013-05-18 MED ORDER — LOSARTAN POTASSIUM 100 MG PO TABS: 50.0000 mg | ORAL_TABLET | Freq: Every day | ORAL | Status: DC

## 2013-05-18 MED ORDER — PRAVASTATIN SODIUM 40 MG PO TABS: 40.0000 mg | ORAL_TABLET | Freq: Every evening | ORAL | Status: DC

## 2013-05-18 MED ORDER — AMLODIPINE BESYLATE 10 MG PO TABS: 10.0000 mg | ORAL_TABLET | Freq: Every day | ORAL | Status: DC

## 2013-05-18 MED ORDER — GABAPENTIN 300 MG PO CAPS: 300.0000 mg | ORAL_CAPSULE | Freq: Three times a day (TID) | ORAL | Status: DC

## 2013-05-18 MED ORDER — PANTOPRAZOLE SODIUM 40 MG PO TBEC: 40.0000 mg | DELAYED_RELEASE_TABLET | Freq: Two times a day (BID) | ORAL | Status: DC

## 2013-05-18 MED ORDER — RANOLAZINE ER 1000 MG PO TB12: 1000.0000 mg | ORAL_TABLET | Freq: Two times a day (BID) | ORAL | Status: DC

## 2013-05-18 MED ORDER — CARVEDILOL 25 MG PO TABS: 37.5000 mg | ORAL_TABLET | Freq: Two times a day (BID) | ORAL | Status: DC

## 2013-05-18 NOTE — Patient Instructions (Signed)
I will contact [facility] tomorrow regarding your metformin.  Even if your kidney doctor say it's ok for you to be on the metformin, you also need something more to better control your diabetes.  I will ask them which medication they would prefer.

## 2013-05-18 NOTE — Progress Notes (Signed)
Subjective:    Patient ID: Matthew Duel., male    DOB: Jan 07, 1961, 52 y.o.   MRN: [id number]  HPI This 52 y.o. male presents for refills of all his medications, a renewal of his Hand[employer] license plate due to inability to walk 200 feet without stopping to rest, due to angina pain.  Last saw Dr. Aundra Dubin about 2 weeks ago, and reports that he also recently saw nephrology, though I do not have access to that note at this time.  He has CAD, angina, HTN, Hyperlipidemia, CKD stage III, and uncontrolled DM type 2.  His labs over the past 2 months are reviewed.  He was last here to see his PCP, Dr. Janett Billow Copland, in 11/2012 with an A1C of 8.8.  It had risen to 10.9 on 03/13/2013, and I note that his glucose has been uncontrolled for at least the past two years.  He has been prescribed insulin, but his insurance wouldn't cover Lantus, and he tells me that they wouldn't cover any others.  Last BMET (04/09/2013) revealed BUN 31, Cr 2.01, GFR 42; 03/14/2013 Cr 1.63, GFR 54; 10/31/2012 BUN 25, Cr 1.44, GFR 63.  During his most recent hospitalization, HCTZ and metformin were discontinued, much to his dismay.  He reports that he asked that his nephrologist be notified of his admission and consulted before medications were stopped.  He reports that when he recently saw his nephrologist, he was told that his kidney function was fine, and that he only needed to worry about his HTN, and was advised to get a home BP cuff and monitor his BP daily.  He wants me to refill metformin 500 mg BID and hydrodiuril 25 mg QAM.  Review of Systems Angina at rest and with exertion, accompanied by SOB.  Denies GI/GU symptoms.  No HA, dizziness, weakness.  No rash, muscle or joint pain.     Objective:   Physical Exam Blood pressure 132/82, pulse 82, temperature 98.7 F (37.1 C), temperature source Oral, resp. rate 20, height 5\' 6"  (1.676 m), weight 222 lb (100.699 kg), SpO2 97.00%. Body mass index is 35.85  kg/(m^2). Well-developed, well nourished BM who is awake, alert and oriented, in NAD. HEENT: [place]/AT, sclera and conjunctiva are clear.   Neck: supple, non-tender, no lymphadenopathy, thyromegaly. Heart: RRR, no murmur Lungs: normal effort, CTA Extremities: no cyanosis, clubbing or edema. Skin: warm and dry without rash. Psychologic: good mood and appropriate affect, normal speech and behavior.  See DM foot exam-normal.  Toenails are well trimmed. Good pulses.  No skin breakdown.      Assessment & Plan:  Diabetes type 2, uncontrolled - I will contact his nephrologist tomorrow to clarify the metformin issue.  Even with metformin, he'll need an additional agent.  Insulin would be the ideal choice, but we'll need to get information from his insurance and the formulary.  Dyslipidemia - Plan: pravastatin (PRAVACHOL) 40 MG tablet  HTN (hypertension) - Plan: amLODipine (NORVASC) 10 MG tablet  Obesity, unspecified - continue his efforts for healthier eating.  His exercise is limited due to angina.  OSA on CPAP  CKD (chronic kidney disease), stage III - continue follow-up with nephrology.  Needs improved glucose control.  HTN is controlled today.  Coronary artery disease, followed by [facility] - Plan: carvedilol (COREG) 25 MG tablet, ranolazine (RANEXA) 1000 MG SR tablet, losartan (COZAAR) 100 MG tablet  Angina pectoris - Plan: gabapentin (NEURONTIN) 300 MG capsule, nitroGLYCERIN (NITROSTAT) 0.4 MG SL tablet, isosorbide mononitrate (IMDUR) 30  MG 24 hr tablet  GERD (gastroesophageal reflux disease) - Plan: pantoprazole (PROTONIX) 40 MG tablet  Need for Tdap vaccination - Plan: Tdap vaccine greater than or equal to 7yo IM  Need for influenza vaccination - Plan: Flu Vaccine QUAD 36+ mos IM  Follow-up with PCP in 3 months, sooner if needed.  Fara Chute, PA-C Physician Assistant-Certified [facility]

## 2013-05-19 ENCOUNTER — Telehealth: Payer: Self-pay | Admitting: Physician Assistant

## 2013-05-19 DIAGNOSIS — E119 Type 2 diabetes mellitus without complications: Secondary | ICD-10-CM

## 2013-05-19 NOTE — Telephone Encounter (Signed)
Please call this patient.  I spoke with Dr. Florene Glen this morning about his kidney function and metformin and hydrodiuril. Dr. Florene Glen said NOT to restart the metformin or the hydrodiuril.  His BP is controlled at this time, so he should stay on the BP medications he's currently on.  We need to work on getting control of the blood sugar, though, to help prevent worsening of his kidney function. He needs to start insulin. We've had trouble in the past, when his insurance wouldn't cover insulin, so we need to get a copy of the drug formulary for his plan. We'll select one, and then have him come in and teach him how to use it.

## 2013-05-20 NOTE — Telephone Encounter (Signed)
Pt called before this message was seen to check status of metformin, he is very concerned about getting this medication. After seeing this message checked his cost for different types of insulin and got the following information, each are covered under ins as Tier 3 meds:  Lantus 10 ml vials (not pens) 90-day from [employer] $371.74, 30-day from local retail $102.16 Humalog 100 un/ml cartridges (3 mls ea, box of 5) 90-day from [employer] $543.83, 30-day from local retail $159.53 Humalog 100 un/ml VIALS (10 ml/vial) 90-day from [employer] $317.80, 30 day local retail $84.18 Humalin 70/30 suspension 100un/ml 10 ml vials 90-day [employer] $197.14, 30-day local retail $45.00 Humalin N 100un/ml Quick pens (3 ml pens/box of 5 pens) $384.80 (plan didn't specify pharmacy) Humalin N 100un/ml vials (48ml) $124.76  I will send this info to both Chelle and Dr Lorelei Pont per pt request and pt asks that we call him back asap with plan for medication.

## 2013-05-20 NOTE — Telephone Encounter (Signed)
I recommend Lantus, but if that is more than he can afford, I would try Humulin 70/30 10 units BID to start. Re-check with me or Dr. Lorelei Pont in 2 weeks, with a diary of glucose readings.  Check glucose BID (fasting-no food or drink except water, black coffee or unsweetened tea for 8-12 hours and post-prandial-2-3 hours after the largest meal of the day).  I would also call [facility], as their prices appear to be lower than those cited in the message.

## 2013-05-21 ENCOUNTER — Telehealth: Payer: Self-pay | Admitting: Radiology

## 2013-05-21 MED ORDER — INSULIN GLARGINE 100 UNIT/ML ~~LOC~~ SOLN: SUBCUTANEOUS | Status: DC

## 2013-05-21 NOTE — Telephone Encounter (Signed)
He wants the Novolin, Mikenzi Raysor

## 2013-05-21 NOTE — Telephone Encounter (Signed)
Called patient about the Metformin Rx he CAN NOT take this medication any longer for his diabetes. He indicates Dr Florene Glen has told patient the Metformin is fine. I told him we CAN NOT give him the Metformin. Patient states he needs a letter to [facility] about why he needs the insulin. I have advised him we can do this. He wants this sent to [facility], he will call me back with the fax number, will you review the letter and advise.

## 2013-05-21 NOTE — Telephone Encounter (Signed)
I have already discussed letter with him, and Chelle and I have done this, you do not have to will you please send in the Insulin? I can not do this, Amy

## 2013-05-21 NOTE — Telephone Encounter (Signed)
Called and discussed with him.  Explained that his creatinine had gone up and this was why he had to stop his metformin.  As per Chelle's note this was confirmed by his new nephrologist, Dr. Florene Glen.  His last nephrologist, Dr. Hassell Done, has retired.    Reassured him that his BP looked good at his last visit even without his HCTZ.  We need to get him back on insulin for his DM.  He needs me to write a letter to his insurance company.  Will do this right away and fax to number below  Most recent A1c was 10.9  [phone]

## 2013-05-21 NOTE — Telephone Encounter (Signed)
Noted  

## 2013-05-21 NOTE — Telephone Encounter (Signed)
Revised letter printed

## 2013-05-21 NOTE — Addendum Note (Signed)
Addended by: Lamar Blinks C on: 05/21/2013 01:36 PM   Modules accepted: Orders

## 2013-05-21 NOTE — Telephone Encounter (Signed)
Thank you. Patient is going to call with the fax number

## 2013-05-21 NOTE — Telephone Encounter (Signed)
Called pt to advise him of discussion between Dr Florene Glen and Domingo Mend and that Dr Florene Glen does not want him to resume metformin. I advised that I have called his insurance and also [facility] to find the best cost for insulin to better control his blood sugars and protect his kidneys ([facility]'s [employer] brand of Novolin 70/30 is only $24.88 for a 10 ml vial). Pt was VERY upset that the doctors have not called him back to discuss this with him. I advised him that I would be happy to have Chelle or Dr Lorelei Pont call him to discuss if he'd like, that I was calling with info so that he would get the information sooner, but one of them can call. He got more and more upset because Metformin is free for him and he reported that Dr Florene Glen told him after getting his lab results back that he could stay on the metformin and now they are deciding all of the sudden that he can't take it. Pt's wife than got on the phone and stated that she thought that someone should call pt. He is not able or comfortable using the insulin that [facility] has that has to be mixed and pt should have been asked to keep a journal of BS readings before deciding that his BS is uncontrolled. I again advised that I will give this message to both providers and one of them will CB.  Chelle/Dr Copland, Juluis Rainier

## 2013-05-22 NOTE — Addendum Note (Signed)
Addended by: Lamar Blinks C on: 05/22/2013 09:26 AM   Modules accepted: Orders

## 2013-05-22 NOTE — Telephone Encounter (Signed)
Called and discussed with him.  He does want to lantus that I wrote for yesterday, not novolin.    Also ordered a BMP for him to have drawn at his convenience next week so we can follow his creat

## 2013-05-25 NOTE — Telephone Encounter (Signed)
He called and the letter was faxed

## 2013-05-28 ENCOUNTER — Other Ambulatory Visit (INDEPENDENT_AMBULATORY_CARE_PROVIDER_SITE_OTHER): Payer: Medicare HMO | Admitting: Radiology

## 2013-05-28 VITALS — BP 142/86 | HR 74 | Temp 98.0°F | Resp 18 | Ht 65.0 in | Wt 225.0 lb

## 2013-05-28 DIAGNOSIS — E119 Type 2 diabetes mellitus without complications: Secondary | ICD-10-CM

## 2013-05-29 LAB — BASIC METABOLIC PANEL
BUN: 27 mg/dL — ABNORMAL HIGH (ref 6–23)
Calcium: 9.4 mg/dL (ref 8.4–10.5)
Chloride: 109 mEq/L (ref 96–112)
Creat: 1.96 mg/dL — ABNORMAL HIGH (ref 0.50–1.35)

## 2013-05-30 ENCOUNTER — Telehealth: Payer: Self-pay | Admitting: Family Medicine

## 2013-05-30 NOTE — Telephone Encounter (Signed)
Called him to go over labs.  Explained that his creat is too high for him to be on metformin. He is still trying to get lantus, and is also having a hard time paying for his ranexa as he is in the donut hole.  Recommended that he call these manufacturers directly, and gave the pt assistance numbers for manufacturers of both lantus and ranexa.  Will fax his most recent labs to Dr. Florene Glen.  Will need a recheck A1c in one month

## 2013-06-01 NOTE — Progress Notes (Signed)
Patient ID: Matthew Rowe., male   DOB: March 18, 1961, 52 y.o.   MRN: [id number] Gave patient samples of Ranexa

## 2013-06-05 ENCOUNTER — Telehealth: Payer: Self-pay | Admitting: Family Medicine

## 2013-06-05 NOTE — Telephone Encounter (Signed)
Called and let him know I ordered some lantus samples for him- will let him know as soon as they come in

## 2013-06-05 NOTE — Telephone Encounter (Signed)
This has been done. Thanks.

## 2013-06-12 ENCOUNTER — Telehealth: Payer: Self-pay | Admitting: Family Medicine

## 2013-06-12 NOTE — Telephone Encounter (Signed)
Gave Pt 7 Solostar Pen samples today- per Dr.Copland. Eliezer Lofts

## 2013-07-05 ENCOUNTER — Emergency Department (HOSPITAL_COMMUNITY): Payer: Medicare PPO

## 2013-07-05 ENCOUNTER — Other Ambulatory Visit: Payer: Self-pay

## 2013-07-05 ENCOUNTER — Encounter (HOSPITAL_COMMUNITY): Payer: Self-pay | Admitting: Emergency Medicine

## 2013-07-05 ENCOUNTER — Emergency Department (HOSPITAL_COMMUNITY)
Admission: EM | Admit: 2013-07-05 | Discharge: 2013-07-05 | Disposition: A | Discharge status: Home / Self Care | Payer: Medicare PPO | Attending: Emergency Medicine | Admitting: Emergency Medicine

## 2013-07-05 DIAGNOSIS — Z951 Presence of aortocoronary bypass graft: Secondary | ICD-10-CM | POA: Insufficient documentation

## 2013-07-05 DIAGNOSIS — E119 Type 2 diabetes mellitus without complications: Secondary | ICD-10-CM | POA: Insufficient documentation

## 2013-07-05 DIAGNOSIS — Z9889 Other specified postprocedural states: Secondary | ICD-10-CM | POA: Insufficient documentation

## 2013-07-05 DIAGNOSIS — Z794 Long term (current) use of insulin: Secondary | ICD-10-CM | POA: Insufficient documentation

## 2013-07-05 DIAGNOSIS — G4733 Obstructive sleep apnea (adult) (pediatric): Secondary | ICD-10-CM | POA: Insufficient documentation

## 2013-07-05 DIAGNOSIS — Z79899 Other long term (current) drug therapy: Secondary | ICD-10-CM | POA: Insufficient documentation

## 2013-07-05 DIAGNOSIS — Z7982 Long term (current) use of aspirin: Secondary | ICD-10-CM | POA: Insufficient documentation

## 2013-07-05 DIAGNOSIS — I129 Hypertensive chronic kidney disease with stage 1 through stage 4 chronic kidney disease, or unspecified chronic kidney disease: Secondary | ICD-10-CM | POA: Insufficient documentation

## 2013-07-05 DIAGNOSIS — Z9861 Coronary angioplasty status: Secondary | ICD-10-CM | POA: Insufficient documentation

## 2013-07-05 DIAGNOSIS — R739 Hyperglycemia, unspecified: Secondary | ICD-10-CM

## 2013-07-05 DIAGNOSIS — I2510 Atherosclerotic heart disease of native coronary artery without angina pectoris: Secondary | ICD-10-CM | POA: Insufficient documentation

## 2013-07-05 DIAGNOSIS — Z9981 Dependence on supplemental oxygen: Secondary | ICD-10-CM | POA: Insufficient documentation

## 2013-07-05 DIAGNOSIS — R0789 Other chest pain: Secondary | ICD-10-CM | POA: Insufficient documentation

## 2013-07-05 DIAGNOSIS — R079 Chest pain, unspecified: Secondary | ICD-10-CM

## 2013-07-05 DIAGNOSIS — N189 Chronic kidney disease, unspecified: Secondary | ICD-10-CM | POA: Insufficient documentation

## 2013-07-05 DIAGNOSIS — K219 Gastro-esophageal reflux disease without esophagitis: Secondary | ICD-10-CM | POA: Insufficient documentation

## 2013-07-05 LAB — CBC
HCT: 36.5 % — ABNORMAL LOW (ref 39.0–52.0)
Hemoglobin: 12.4 g/dL — ABNORMAL LOW (ref 13.0–17.0)
MCHC: 34 g/dL (ref 30.0–36.0)
MCV: 87.5 fL (ref 78.0–100.0)
WBC: 7 10*3/uL (ref 4.0–10.5)

## 2013-07-05 LAB — BASIC METABOLIC PANEL
BUN: 28 mg/dL — ABNORMAL HIGH (ref 6–23)
Calcium: 8.6 mg/dL (ref 8.4–10.5)
Chloride: 107 mEq/L (ref 96–112)
Creatinine, Ser: 1.78 mg/dL — ABNORMAL HIGH (ref 0.50–1.35)
GFR calc non Af Amer: 42 mL/min — ABNORMAL LOW (ref >90)
Glucose, Bld: 303 mg/dL — ABNORMAL HIGH (ref 70–99)
Potassium: 4.1 mEq/L (ref 3.5–5.1)
Sodium: 139 mEq/L (ref 135–145)

## 2013-07-05 LAB — GLUCOSE, CAPILLARY: Glucose-Capillary: 90 mg/dL (ref 70–99)

## 2013-07-05 LAB — TROPONIN I: Troponin I: <0.3 ng/mL (ref <0.30)

## 2013-07-05 MED ORDER — INSULIN ASPART 100 UNIT/ML ~~LOC~~ SOLN
10.0000 [IU] | Freq: Once | SUBCUTANEOUS | Status: AC
  Administered 2013-07-05: 10 [IU] via INTRAVENOUS
  Filled 2013-07-05: qty 1

## 2013-07-05 MED ORDER — MORPHINE SULFATE 4 MG/ML IJ SOLN
4.0000 mg | Freq: Once | INTRAMUSCULAR | Status: AC
  Administered 2013-07-05: 4 mg via INTRAVENOUS
  Filled 2013-07-05: qty 1

## 2013-07-05 MED ORDER — GI COCKTAIL ~~LOC~~
30.0000 mL | Freq: Once | ORAL | Status: AC
  Administered 2013-07-05: 30 mL via ORAL
  Filled 2013-07-05: qty 30

## 2013-07-05 NOTE — ED Notes (Signed)
Pt c/o substernal CP. Denis SOB, N/v. Pain exacerbated by nothing, and relived by Nitro. Pt states took 2 Nitro SL around 12. Also states that home CBG reading over 600.

## 2013-07-05 NOTE — ED Notes (Signed)
Pt states that he self administered 2 adult ASA

## 2013-07-05 NOTE — ED Notes (Signed)
Pt reports chest pain starting last night to L chest radiating to L arm without any accompanying symptoms and unsatisfactorily relieved with nitro at home.

## 2013-07-05 NOTE — ED Provider Notes (Signed)
CSN: [id number]     Arrival date & time 07/05/13  0325 History   First MD Initiated Contact with Patient 07/05/13 0440     Chief Complaint  Patient presents with  . Chest Pain   (Consider location/radiation/quality/duration/timing/severity/associated sxs/prior Treatment) HPI 52 yo male presents to the ER from home with complaint of chest pain.  Patient has had chest pain intermittently since Thursday when he was going up several stairs.  Pain is c/w his chronic angina.  Tonight after eating pizza he had spike in his blood sugars to 300s, and pain became worse.  He tried to sleep, but the pain woke him up.  He denies any nausea, diaphoresis, sweating.  He took SL ntg x 2 without improvement, 2 regular strength aspirins.  Pain has been intermittent since Thursday, but constant and worse since yesterday around 6 pm.  He reports when his blood sugar spikes, he often has worse chest pain.  Pt took additional lantus 22 units and left over metformin at midnight.    Past Medical History  Diagnosis Date  . Chest pain     Chronic  . Dyslipidemia   . Coronary artery disease     a. INF MI 1999: PCI of RCA;  b.  extensive stenting of LAD;  c. s/p CABG with RIMA->RCA in 2006;  d. Stable, Low risk MV 05/2012; e. 09/2012 Cath: LM 40-50d, LAD 30 isr, patent mid stents, 100d, LCX 70p, RI diff dzs, RCA (anomalous take off) 50p, patent RIMA->med rx.  . Hypertension   . Hx of echocardiogram     a. Echo 3/12:  mild LVH, EF 55-60%, trivial AI  . DM2 (diabetes mellitus, type 2)   . History of blood transfusion ?2011  . GERD (gastroesophageal reflux disease)     "once; came to hospital" (09/15/2012)  . OSA on CPAP   . CKD (chronic kidney disease)   . Polycystic kidney disease    Past Surgical History  Procedure Laterality Date  . Cystectomy  1990's    off face  . Coronary angioplasty with stent placement  1999 / 2002 / 2004 / 2006?  Marland Kitchen Cardiac catheterization  2012  . Coronary artery bypass graft  2006     CABG X?; in [place]  . Cardiac catheterization  2013  . Cardiac catheterization  2014    LHC (1/14): total occlusion of distal LAD, diffuse disease in ramus, 70% proximal LCx, 50% proximal and mid RCA, 50% distal RCA, patent RIMA-RCA.   Family History  Problem Relation Age of Onset  . Cancer Mother   . Anemia Mother   . Kidney disease Mother   . Diabetes Father   . Heart disease Father   . Heart attack Father   . Heart failure Father   . Hyperlipidemia Father   . Hypertension Father   . Sudden death Father   . Kidney failure Sister 5  . Kidney disease Sister   . Heart attack Sister   . Heart disease Sister   . Hypertension Sister   . Diabetes Sister   . Diabetes Brother   . Kidney disease Brother    History  Substance Use Topics  . Smoking status: Never Smoker   . Smokeless tobacco: Never Used     Comment: smoked some as a teenager (high school)  . Alcohol Use: No     Comment: 09/15/2012 "drank a little when I was young"    Review of Systems  All other systems reviewed and are negative.  Allergies  Ciprocin-fluocin-procin; Clarithromycin; and Glimepiride  Home Medications   Current Outpatient Rx  Name  Route  Sig  Dispense  Refill  . amLODipine (NORVASC) 10 MG tablet   Oral   Take 1 tablet (10 mg total) by mouth daily.   90 tablet   1   . aspirin 325 MG tablet   Oral   Take 325 mg by mouth daily.          . carvedilol (COREG) 25 MG tablet   Oral   Take 1.5 tablets (37.5 mg total) by mouth 2 (two) times daily with a meal.   180 tablet   1   . gabapentin (NEURONTIN) 300 MG capsule   Oral   Take 1 capsule (300 mg total) by mouth 3 (three) times daily.   270 capsule   1   . insulin glargine (LANTUS) 100 UNIT/ML injection   Subcutaneous   Inject 20-30 Units into the skin 2 (two) times daily. Usually takes 20 units, if blood sugars are elevated can take up to 30 units         . isosorbide mononitrate (IMDUR) 60 MG 24 hr tablet   Oral   Take 60  mg by mouth 2 (two) times daily.         Marland Kitchen losartan (COZAAR) 100 MG tablet   Oral   Take 0.5 tablets (50 mg total) by mouth daily.   90 tablet   1   . nitroGLYCERIN (NITROSTAT) 0.4 MG SL tablet   Sublingual   Place 1 tablet (0.4 mg total) under the tongue every 5 (five) minutes as needed.   100 tablet   1   . pantoprazole (PROTONIX) 40 MG tablet   Oral   Take 1 tablet (40 mg total) by mouth 2 (two) times daily with a meal.   180 tablet   1   . pravastatin (PRAVACHOL) 40 MG tablet   Oral   Take 1 tablet (40 mg total) by mouth every evening.   90 tablet   1   . ranolazine (RANEXA) 1000 MG SR tablet   Oral   Take 1 tablet (1,000 mg total) by mouth 2 (two) times daily.   180 tablet   3   . vitamin E 1000 UNIT capsule   Oral   Take 1,000 Units by mouth 2 (two) times daily.           Marland Kitchen zolpidem (AMBIEN) 10 MG tablet   Oral   Take 0.5 tablets (5 mg total) by mouth at bedtime as needed. For insomnia   45 tablet   0     [facility] mail order pharmacy- fax WITH COVER SH ...   . acetaminophen (TYLENOL) 325 MG tablet   Oral   Take 975 mg by mouth every 6 (six) hours as needed for pain.         . Alcohol Swabs PADS      Use to test blood sugar 3 times daily. Dx code 250.00   300 each   1   . Blood Glucose Monitoring Suppl DEVI      Use to test blood sugar 3 times daily. Dx code 250.00.   1 each   0     PRODIGY AUTOCODE METER KIT   . glucose blood test strip      Use to test blood sugar 3 times daily. Dx code: 250.00   300 each   1     PRODIGY NO CODING STRIPS   .  Insulin Pen Needle 31G X 5 MM MISC      Use one daily for insulin injection   100 each   6   . Lancets MISC      Use to test blood sugar 3 times daily. Dx code: 250.00.   300 each   1     PRODIGY TWIST TOP 28G    BP 146/89  Pulse 78  Temp(Src) 97.9 F (36.6 C) (Oral)  Resp 18  SpO2 98% Physical Exam  Nursing note and vitals reviewed. Constitutional: He is oriented to  person, place, and time. He appears well-developed and well-nourished. He appears distressed (uncomfortable appearing).  HENT:  Head: Normocephalic and atraumatic.  Nose: Nose normal.  Mouth/Throat: Oropharynx is clear and moist.  Eyes: Conjunctivae and EOM are normal. Pupils are equal, round, and reactive to light.  Neck: Normal range of motion. Neck supple. No JVD present. No tracheal deviation present. No thyromegaly present.  Cardiovascular: Normal rate, regular rhythm, normal heart sounds and intact distal pulses.  Exam reveals no gallop and no friction rub.   No murmur heard. Pulmonary/Chest: Effort normal and breath sounds normal. No stridor. No respiratory distress. He has no wheezes. He has no rales. He exhibits no tenderness.  Abdominal: Soft. Bowel sounds are normal. He exhibits no distension and no mass. There is no tenderness. There is no rebound and no guarding.  Musculoskeletal: Normal range of motion. He exhibits no edema and no tenderness.  Lymphadenopathy:    He has no cervical adenopathy.  Neurological: He is alert and oriented to person, place, and time. He exhibits normal muscle tone. Coordination normal.  Skin: Skin is warm and dry. No rash noted. No erythema. No pallor.  Psychiatric: He has a normal mood and affect. His behavior is normal. Judgment and thought content normal.    ED Course  Procedures (including critical care time) Labs Review Labs Reviewed  CBC - Abnormal; Notable for the following:    RBC 4.17 (*)    Hemoglobin 12.4 (*)    HCT 36.5 (*)    All other components within normal limits  BASIC METABOLIC PANEL - Abnormal; Notable for the following:    Glucose, Bld 303 (*)    BUN 28 (*)    Creatinine, Ser 1.78 (*)    GFR calc non Af Amer 42 (*)    GFR calc Af Amer 49 (*)    All other components within normal limits  GLUCOSE, CAPILLARY - Abnormal; Notable for the following:    Glucose-Capillary 288 (*)    All other components within normal limits   TROPONIN I  TROPONIN I  GLUCOSE, CAPILLARY   Imaging Review Dg Chest 2 View  07/05/2013   *RADIOLOGY REPORT*  Clinical Data: Substernal chest pain.  CHEST - 2 VIEW  Comparison: Chest radiograph performed 04/09/2013  Findings: The lungs are well-aerated and clear.  There is no evidence of focal opacification, pleural effusion or pneumothorax.  The heart is normal in size; the patient is status post median sternotomy.  No acute osseous abnormalities are seen.  IMPRESSION: No acute cardiopulmonary process seen.   Original Report Authenticated By: Santa Lighter, M.D.    EKG Interpretation     Ventricular Rate:  80 PR Interval:  157 QRS Duration: 90 QT Interval:  368 QTC Calculation: 424 R Axis:   -65 Text Interpretation:  Sinus or ectopic atrial rhythm LAD, consider left anterior fascicular block Anterior infarct, old Abnormal T, consider ischemia, lateral leads No significant change  since last tracing            MDM   1. Hyperglycemia without ketosis   2. Chest pain    52 yo male with chronic angina with worsening of symptoms tonight along with elevated bs.  EKG without ischemic changes.  Initial troponin with over 6 hours of pain negative.  Will plan for second troponin, will treat blood sugar and pain.   8:18 AM Pt has had two negative troponins.  He is pain free.  Blood sugar has improved.  D/w Dr Bronson Ing on call for [facility] cardiology, who agrees with d/c home and close f/u with Dr Aundra Dubin.    Kalman Drape, MD 07/05/13 [phone]

## 2013-07-07 ENCOUNTER — Telehealth (HOSPITAL_COMMUNITY): Payer: Self-pay | Admitting: Cardiac Rehabilitation

## 2013-07-07 ENCOUNTER — Other Ambulatory Visit: Payer: Self-pay | Admitting: Physician Assistant

## 2013-07-07 ENCOUNTER — Other Ambulatory Visit: Payer: Self-pay | Admitting: Family Medicine

## 2013-07-07 ENCOUNTER — Telehealth: Payer: Self-pay

## 2013-07-07 DIAGNOSIS — E1165 Type 2 diabetes mellitus with hyperglycemia: Secondary | ICD-10-CM

## 2013-07-07 NOTE — Telephone Encounter (Signed)
Patient called back.   [phone]

## 2013-07-07 NOTE — Telephone Encounter (Signed)
Patient was recently released from the hospital. States that there are several things he would like to ask Dr. Lorelei Pont including questions about medications that were prescribed to him.   [phone]

## 2013-07-07 NOTE — Telephone Encounter (Signed)
Pt contacted to enroll in cardiac rehab. Pt unable to afford copay.  $50/copay per visit.  Max OOP $4000/ [id number] to date met.  Pt not eligible for financial assistance since he has insurance.  Pt would to reassess the insurance copayment amount in 2015.  Will call pt back in Jan at his request.  Pt given information about maintenance program, however pt states he is not able to afford self pay amount.

## 2013-07-07 NOTE — Telephone Encounter (Signed)
Called him, what are questions? Left message for him to call me back.

## 2013-07-08 MED ORDER — INSULIN PEN NEEDLE 31G X 5 MM MISC: Status: DC

## 2013-07-08 NOTE — Telephone Encounter (Signed)
Called him, he needed pen needles sent in. These have been sent. He is asking for lantus samples, he wants to know if you have more.

## 2013-07-08 NOTE — Telephone Encounter (Signed)
Pt had called back and had no questions concerning the metformin. I have refused this RF req since pt was taken off this medication.

## 2013-07-08 NOTE — Telephone Encounter (Signed)
Dr Lorelei Pont, please see phone message from pt 07/07/13. Our most recent notes concerning Metformin are that pt is to remain off of the metformin. Please advise.

## 2013-07-09 MED ORDER — INSULIN GLARGINE 100 UNIT/ML ~~LOC~~ SOLN: 20.0000 [IU] | Freq: Two times a day (BID) | SUBCUTANEOUS | Status: DC

## 2013-07-09 NOTE — Telephone Encounter (Signed)
Ordered more lantus samples from [employer] and let him know.  However, he might want to contact them about pt assistance program.  Gave him the number for [employer] pt connection

## 2013-07-16 ENCOUNTER — Telehealth: Payer: Self-pay | Admitting: *Deleted

## 2013-07-16 ENCOUNTER — Encounter: Payer: Self-pay | Admitting: *Deleted

## 2013-07-16 NOTE — Telephone Encounter (Signed)
Patient in donut hole and needs Ranexa, I have called rep for a restock because we are out of samples, I will also provide the patient some patient assistance through the Ranexa.

## 2013-07-17 ENCOUNTER — Telehealth: Payer: Self-pay | Admitting: Cardiology

## 2013-07-17 NOTE — Telephone Encounter (Signed)
Error:  Pt calling for samples... Transferred to [employer]

## 2013-07-21 NOTE — Telephone Encounter (Signed)
New Problem:  Pt's wife is calling to see if Dr. Aundra Dubin can prescribe her husband a generic for raniska. Please advise

## 2013-07-21 NOTE — Telephone Encounter (Signed)
Left patient 3 weeks of Ranexa up front along with a patient assistance application and co-pay card from [employer]

## 2013-08-11 ENCOUNTER — Telehealth: Payer: Self-pay

## 2013-08-11 NOTE — Telephone Encounter (Signed)
Patient states that he needs prior authorization to get the Lantis Flex Pen. The number patient gave is [phone]  [phone]

## 2013-08-19 NOTE — Telephone Encounter (Signed)
The original message was not routed until today. Unsure why.

## 2013-08-19 NOTE — Telephone Encounter (Signed)
Patient is following up on his previous call from ten days ago.

## 2013-08-20 NOTE — Telephone Encounter (Signed)
I called pt who advised he is trying to get asst from [employer] for his Lantus pens and they need info from [place]. I called [employer] who reported that they sent pt a copy of form on 08/11/13 and that page 1 is for provider to complete, page 2 is for pt and proof of income. She stated she will also fax me a copy of form.

## 2013-08-20 NOTE — Telephone Encounter (Signed)
Completed what I can on form and have put in Dr Copland's box for completion/signature. Spoke to pt and updated on status and advised we will call him when form ready for p/up so he can fill out his portion. Pt agreed, but stated that he is down to one pen and will be out of lantus by Monday at the latest. Pt reqs another sample to hold him over. Dr Lorelei Pont, please advise.

## 2013-08-21 ENCOUNTER — Telehealth: Payer: Self-pay

## 2013-08-21 NOTE — Telephone Encounter (Signed)
Patients wife is very concerned and impatient that their request for her husbands Medication (samples) have not been done yet- to have the form filled. Dr. Lorelei Pont just came in at 10am and patient was told this yesterday. They are very anxious (they keep saying that they don't know what to do next, but [place]- RN gave clear instructions yesterday over the phone to the patient) . Please advise patients wife urged to give Dr. Lorelei Pont a message.    Best:  [phone]

## 2013-08-21 NOTE — Telephone Encounter (Signed)
Called and LMOM.  i am sorry to hear that. His papers are ready when he can come in.    I was able to order some more solostar pens for him

## 2013-08-21 NOTE — Telephone Encounter (Signed)
I have done form now.  Called to let him know.  He wants more samples- explained to him that I have been specially ordering these for him and need to have advance notice if he needs these.  I will attempt to order more for him now, but I may not be able to order more now,  I am only allocated these every so often.    I was able to order these samples and will let him know when they come in

## 2013-08-21 NOTE — Telephone Encounter (Signed)
Patient calling to inform Dr. Lorelei Pont that he is currently having car trouble and will not be able to come in today. Please advise  Best: [phone]

## 2013-08-25 ENCOUNTER — Telehealth: Payer: Self-pay | Admitting: Family Medicine

## 2013-08-25 NOTE — Telephone Encounter (Signed)
Patient came in office to fill out his portion of patient assistance forms for Lantus. He wanted to know if we had any Lantus here for him to pick up. I explained to him we have not received them yet. He was upset wanting to know what he needs to do. He said he feels like "he is being left out in the cold" He has enough Lantus left til end of day Thursday by using 20 units as directed. He was wanting to go back on Metformin for just 14 days. He said he doesn't think just being on medication for 14 days is going to harm his kidneys or do anything to worry about. He is in donut hole and only needs for 14 days. I told him we could send in RX for Lantus. He sad its too expensive. He wants to know if he can go back on Metformin? If not what does he need to do? He wanted for me to discuss with you.

## 2013-08-27 ENCOUNTER — Other Ambulatory Visit: Payer: Self-pay | Admitting: *Deleted

## 2013-08-27 DIAGNOSIS — E1165 Type 2 diabetes mellitus with hyperglycemia: Secondary | ICD-10-CM

## 2013-08-27 MED ORDER — INSULIN GLARGINE 100 UNIT/ML SOLOSTAR PEN: 20.0000 [IU] | PEN_INJECTOR | Freq: Two times a day (BID) | SUBCUTANEOUS | Status: DC

## 2013-08-27 NOTE — Telephone Encounter (Signed)
Patient is out of lantus and needs something done NOW.  [phone]

## 2013-08-27 NOTE — Telephone Encounter (Signed)
Called and LMOM.  I am sorry that his samples have not come in yet, but please bear in mind that I do order these from the manufacturer especially for him and I do not know in advance when he needs them.  I will let him know when they come in.  It appears that he has applied to the pt assistance program through [employer] as I have suggested to him which is very good  Also, I have not seen him in the office since march- if he could please come in and see me that would be great.

## 2013-08-28 ENCOUNTER — Telehealth: Payer: Self-pay | Admitting: *Deleted

## 2013-08-28 NOTE — Telephone Encounter (Signed)
Patient aware that ranexa samples will be left at the front desk for pick up.

## 2013-09-09 ENCOUNTER — Other Ambulatory Visit: Payer: Self-pay | Admitting: Physician Assistant

## 2013-09-09 ENCOUNTER — Other Ambulatory Visit: Payer: Self-pay | Admitting: Family Medicine

## 2013-09-10 ENCOUNTER — Other Ambulatory Visit: Payer: Self-pay

## 2013-09-10 DIAGNOSIS — G4700 Insomnia, unspecified: Secondary | ICD-10-CM

## 2013-09-10 NOTE — Telephone Encounter (Signed)
Re-faxed [employer] form in w/confirmation for assist for Lantus. Copied form to be scanned and left original for pt to p/up in drawer. Called pt and explained I refaxed since we have not heard from [place] but explained that they require documentation of income to be sent also and he may want to go ahead and send this in himself so they will have that also. Pt agreed and will p/up form when he comes to see Dr Lorelei Pont, probably next Wed.

## 2013-09-10 NOTE — Telephone Encounter (Signed)
Pharm reqs RF of zolpidem.

## 2013-09-11 MED ORDER — ZOLPIDEM TARTRATE 10 MG PO TABS: 5.0000 mg | ORAL_TABLET | Freq: Every evening | ORAL | Status: DC | PRN

## 2013-09-16 ENCOUNTER — Telehealth: Payer: Self-pay | Admitting: Radiology

## 2013-09-18 ENCOUNTER — Other Ambulatory Visit: Payer: Self-pay

## 2013-09-18 DIAGNOSIS — I209 Angina pectoris, unspecified: Secondary | ICD-10-CM

## 2013-09-18 MED ORDER — INSULIN GLARGINE 100 UNIT/ML SOLOSTAR PEN: 20.0000 [IU] | PEN_INJECTOR | Freq: Two times a day (BID) | SUBCUTANEOUS | Status: DC

## 2013-09-18 NOTE — Telephone Encounter (Signed)
Dr. Lorelei Pont: [facility] sent prescription refill requests on Gabatentin 300 mg capsule and insulin pens.  Please call patient at [phone].  They sent request before Christmas and have not heard back from [place].  He also made an appt for 09/28/13.

## 2013-09-18 NOTE — Telephone Encounter (Addendum)
I have resent the Lantus, please advise on the Gabapentin. Patient was advised he was approved by [employer] to get the lantus.

## 2013-09-22 MED ORDER — GABAPENTIN 300 MG PO CAPS: 300.0000 mg | ORAL_CAPSULE | Freq: Three times a day (TID) | ORAL | Status: DC

## 2013-09-22 NOTE — Telephone Encounter (Signed)
Patient was advised the Lantus approved by [employer], and meds faxed for him.

## 2013-09-23 ENCOUNTER — Telehealth: Payer: Self-pay

## 2013-09-23 ENCOUNTER — Telehealth: Payer: Self-pay | Admitting: *Deleted

## 2013-09-23 NOTE — Telephone Encounter (Signed)
Patient's wife states that some diabetic pen samples were ordered for patient and she wants to know if they are going to be sent to the house or will they need to come to the office to get those.   [phone]

## 2013-09-23 NOTE — Telephone Encounter (Signed)
Spoke to pt wife. Advised the paperwork Dr. Lorelei Pont sent to [facility] is still in the drawer. She was not aware that this was an assistance program through the prescription company. Advised her they will need income verification most likely. She will be in to pick up the documents and complete the process.

## 2013-09-23 NOTE — Telephone Encounter (Signed)
Patients wife requests ranexa samples for patient. She is aware that they will be left at the front desk for pick up.

## 2013-09-28 ENCOUNTER — Ambulatory Visit: Payer: Medicare PPO | Admitting: Cardiology

## 2013-09-29 ENCOUNTER — Other Ambulatory Visit: Payer: Self-pay | Admitting: Family Medicine

## 2013-09-30 ENCOUNTER — Other Ambulatory Visit: Payer: Self-pay | Admitting: Family Medicine

## 2013-09-30 ENCOUNTER — Ambulatory Visit (INDEPENDENT_AMBULATORY_CARE_PROVIDER_SITE_OTHER): Payer: Medicare HMO | Admitting: Family Medicine

## 2013-09-30 VITALS — BP 142/88 | HR 77 | Temp 97.9°F | Resp 18 | Ht 66.0 in | Wt 236.2 lb

## 2013-09-30 DIAGNOSIS — E118 Type 2 diabetes mellitus with unspecified complications: Principal | ICD-10-CM

## 2013-09-30 DIAGNOSIS — E1165 Type 2 diabetes mellitus with hyperglycemia: Secondary | ICD-10-CM

## 2013-09-30 DIAGNOSIS — I208 Other forms of angina pectoris: Secondary | ICD-10-CM

## 2013-09-30 DIAGNOSIS — N289 Disorder of kidney and ureter, unspecified: Secondary | ICD-10-CM

## 2013-09-30 DIAGNOSIS — I2089 Other forms of angina pectoris: Secondary | ICD-10-CM

## 2013-09-30 DIAGNOSIS — N189 Chronic kidney disease, unspecified: Secondary | ICD-10-CM

## 2013-09-30 DIAGNOSIS — IMO0002 Reserved for concepts with insufficient information to code with codable children: Secondary | ICD-10-CM

## 2013-09-30 DIAGNOSIS — I2510 Atherosclerotic heart disease of native coronary artery without angina pectoris: Secondary | ICD-10-CM

## 2013-09-30 DIAGNOSIS — E785 Hyperlipidemia, unspecified: Secondary | ICD-10-CM

## 2013-09-30 DIAGNOSIS — I209 Angina pectoris, unspecified: Secondary | ICD-10-CM

## 2013-09-30 DIAGNOSIS — I10 Essential (primary) hypertension: Secondary | ICD-10-CM

## 2013-09-30 DIAGNOSIS — K219 Gastro-esophageal reflux disease without esophagitis: Secondary | ICD-10-CM

## 2013-09-30 LAB — POCT GLYCOSYLATED HEMOGLOBIN (HGB A1C): Hemoglobin A1C: 8.5

## 2013-09-30 MED ORDER — GABAPENTIN 300 MG PO CAPS: 300.0000 mg | ORAL_CAPSULE | Freq: Three times a day (TID) | ORAL | Status: DC

## 2013-09-30 MED ORDER — AMLODIPINE BESYLATE 10 MG PO TABS: 10.0000 mg | ORAL_TABLET | Freq: Every day | ORAL | Status: DC

## 2013-09-30 MED ORDER — LOSARTAN POTASSIUM 100 MG PO TABS: 100.0000 mg | ORAL_TABLET | Freq: Every day | ORAL | Status: DC

## 2013-09-30 MED ORDER — PANTOPRAZOLE SODIUM 40 MG PO TBEC: 40.0000 mg | DELAYED_RELEASE_TABLET | Freq: Two times a day (BID) | ORAL | Status: DC

## 2013-09-30 MED ORDER — ISOSORBIDE MONONITRATE ER 60 MG PO TB24: 60.0000 mg | ORAL_TABLET | Freq: Two times a day (BID) | ORAL | Status: DC

## 2013-09-30 MED ORDER — CARVEDILOL 25 MG PO TABS
ORAL_TABLET | ORAL | Status: DC
Start: 2013-09-30 — End: 2013-12-01

## 2013-09-30 MED ORDER — PRAVASTATIN SODIUM 40 MG PO TABS: 40.0000 mg | ORAL_TABLET | Freq: Every evening | ORAL | Status: DC

## 2013-09-30 NOTE — Progress Notes (Signed)
[facility] [address], [place] [address] [phone]  Date:  09/30/2013   Name:  Matthew Rowe   DOB:  Feb 22, 1961   MRN:  [id number]  PCP:  Matthew Blinks, MD    Chief Complaint: Follow-up   History of Present Illness:  Matthew Romeo. is a 53 y.o. very pleasant male patient who presents with the following:  Here to recheck his diabetes and work out some issues with his medications.  Last seen here about 10 months ago.  He just used his last dose of lantus this am- 25 units.  He took this at 9am.  He did eat today.   We have had a lot of issues with him getting his lantus. He has still not gotten all his information in for the pt assistance program.  Discussed using something cheaper but he really does not feel he will be able to use a non- pen insulin.  "I can't do needles and my wife is afraid of them."   He was most recently at the ED with CP in October- he ruled out and was released to home.  He will have angina if he exerts himself- especially if his sugar is also high at the time.  He continues to see cardiology and is treated for angina  He gets samples of ranexa from cardiology.  They also rx his nitro.  Otherwise we handle his medications He will see Dr. Aundra Dubin on 10/14/13.    He did get a call from his nephrologist for a 6 month check up recently.  He is not to use metformin any longer because of his creatinine.  He is seeing Dr. Florene Glen now.     He is checking his glucose at home.  It may run under 200; if "I eat a lot of bread" it can run to 300.  He does eat more bread a few times a week.    Patient Active Problem List   Diagnosis Date Noted  . Unstable angina 03/13/2013  . Financial difficulty 03/13/2013  . Medically noncompliant 03/13/2013  . Diabetes type 2, uncontrolled 03/13/2013  . Obesity, unspecified 11/27/2012  . Angina pectoris 09/16/2012  . CKD (chronic kidney disease), stage III 09/15/2012  . Polycystic kidney  disease 09/15/2012  . OSA on CPAP 09/15/2012  . HTN (hypertension) 03/27/2011  . Type 2 diabetes mellitus 12/21/2010  . Dyslipidemia   . Coronary artery disease, followed by [facility]     Past Medical History  Diagnosis Date  . Chest pain     Chronic  . Dyslipidemia   . Coronary artery disease     a. INF MI 1999: PCI of RCA;  b.  extensive stenting of LAD;  c. s/p CABG with RIMA->RCA in 2006;  d. Stable, Low risk MV 05/2012; e. 09/2012 Cath: LM 40-50d, LAD 30 isr, patent mid stents, 100d, LCX 70p, RI diff dzs, RCA (anomalous take off) 50p, patent RIMA->med rx.  . Hypertension   . Hx of echocardiogram     a. Echo 3/12:  mild LVH, EF 55-60%, trivial AI  . DM2 (diabetes mellitus, type 2)   . History of blood transfusion ?2011  . GERD (gastroesophageal reflux disease)     "once; came to hospital" (09/15/2012)  . OSA on CPAP   . CKD (chronic kidney disease)   . Polycystic kidney disease     Past Surgical History  Procedure Laterality Date  . Cystectomy  1990's  off face  . Coronary angioplasty with stent placement  1999 / 2002 / 2004 / 2006?  Marland Kitchen Cardiac catheterization  2012  . Coronary artery bypass graft  2006    CABG X?; in [place]  . Cardiac catheterization  2013  . Cardiac catheterization  2014    LHC (1/14): total occlusion of distal LAD, diffuse disease in ramus, 70% proximal LCx, 50% proximal and mid RCA, 50% distal RCA, patent RIMA-RCA.    History  Substance Use Topics  . Smoking status: Never Smoker   . Smokeless tobacco: Never Used     Comment: smoked some as a teenager (high school)  . Alcohol Use: No     Comment: 09/15/2012 "drank a little when I was young"    Family History  Problem Relation Age of Onset  . Cancer Mother   . Anemia Mother   . Kidney disease Mother   . Diabetes Father   . Heart disease Father   . Heart attack Father   . Heart failure Father   . Hyperlipidemia Father   . Hypertension Father   . Sudden death Father   . Kidney  failure Sister 5  . Kidney disease Sister   . Heart attack Sister   . Heart disease Sister   . Hypertension Sister   . Diabetes Sister   . Diabetes Brother   . Kidney disease Brother     Allergies  Allergen Reactions  . Ciprocin-Fluocin-Procin [Fluocinolone Acetonide] Rash  . Clarithromycin Itching and Other (See Comments)    "Biaxin" Eyes itch and burn  . Glimepiride [Amaryl] Other (See Comments)    Elevates liver function     Medication list has been reviewed and updated.  Current Outpatient Prescriptions on File Prior to Visit  Medication Sig Dispense Refill  . acetaminophen (TYLENOL) 325 MG tablet Take 975 mg by mouth every 6 (six) hours as needed for pain.      . Alcohol Swabs (B-D SINGLE USE SWABS REGULAR) PADS USE TO CHECK BLOOD SUGAR THREE TIMES DAILY  300 each  1  . amLODipine (NORVASC) 10 MG tablet Take 1 tablet (10 mg total) by mouth daily.  90 tablet  1  . aspirin 325 MG tablet Take 325 mg by mouth daily.       . Blood Glucose Monitoring Suppl DEVI Use to test blood sugar 3 times daily. Dx code 250.00.  1 each  0  . carvedilol (COREG) 25 MG tablet TAKE 1 AND 1/2 TABLETS TWICE DAILY WITH MEALS  270 tablet  0  . gabapentin (NEURONTIN) 300 MG capsule Take 1 capsule (300 mg total) by mouth 3 (three) times daily.  270 capsule  0  . hydrochlorothiazide (HYDRODIURIL) 25 MG tablet TAKE 1 TABLET EVERY DAY AS NEEDED  FOR  BLOOD  PRESSURE  90 tablet  1  . Insulin Glargine (LANTUS SOLOSTAR) 100 UNIT/ML Solostar Pen Inject 20-30 Units into the skin 2 (two) times daily. Usually takes 20 units, if blood sugars are elevated can take up to 30 units  15 pen  0  . insulin glargine (LANTUS) 100 UNIT/ML injection Inject 0.2-0.3 mLs (20-30 Units total) into the skin 2 (two) times daily. Usually takes 20 units, if blood sugars are elevated can take up to 30 units  10 mL  6  . Insulin Pen Needle 31G X 5 MM MISC Use one daily for insulin injection  100 each  6  . isosorbide mononitrate (IMDUR)  60 MG 24 hr tablet Take 60  mg by mouth 2 (two) times daily.      Marland Kitchen losartan (COZAAR) 100 MG tablet Take 0.5 tablets (50 mg total) by mouth daily.  90 tablet  1  . nitroGLYCERIN (NITROSTAT) 0.4 MG SL tablet Place 1 tablet (0.4 mg total) under the tongue every 5 (five) minutes as needed.  100 tablet  1  . pantoprazole (PROTONIX) 40 MG tablet Take 1 tablet (40 mg total) by mouth 2 (two) times daily with a meal.  180 tablet  1  . pravastatin (PRAVACHOL) 40 MG tablet Take 1 tablet (40 mg total) by mouth every evening.  90 tablet  1  . PRODIGY NO CODING BLOOD GLUC test strip USE TO CHECK BLOOD SUGAR THREE TIMES DAILY   300 each  1  . PRODIGY TWIST TOP LANCETS 28G MISC USE TO CHECK BLOOD SUGAR THREE TIMES DAILY   300 each  2  . ranolazine (RANEXA) 1000 MG SR tablet Take 1 tablet (1,000 mg total) by mouth 2 (two) times daily.  180 tablet  3  . vitamin E 1000 UNIT capsule Take 1,000 Units by mouth 2 (two) times daily.        Marland Kitchen zolpidem (AMBIEN) 10 MG tablet Take 0.5 tablets (5 mg total) by mouth at bedtime as needed. For insomnia  45 tablet  3   No current facility-administered medications on file prior to visit.    Review of Systems:  As per HPI- otherwise negative.   Physical Examination: Filed Vitals:   09/30/13 1321  BP: 142/88  Pulse: 77  Temp: 97.9 F (36.6 C)  Resp: 18   Filed Vitals:   09/30/13 1321  Height: 5\' 6"  (1.676 m)  Weight: 236 lb 3.2 oz (107.14 kg)   Body mass index is 38.14 kg/(m^2). Ideal Body Weight: Weight in (lb) to have BMI = 25: 154.6  GEN: WDWN, NAD, Non-toxic, A & O x 3, obese HEENT: Atraumatic, Normocephalic. Neck supple. No masses, No LAD. Ears and Nose: No external deformity. CV: RRR, No M/G/R. No JVD. No thrill. No extra heart sounds. PULM: CTA B, no wheezes, crackles, rhonchi. No retractions. No resp. distress. No accessory muscle use. ABD: S, NT, ND EXTR: No c/c/e NEURO Normal gait.  PSYCH: Normally interactive. Conversant. Not depressed or anxious  appearing.  Calm demeanor.  Foot exam today: NV intact, normal filament testing    Results for orders placed in visit on 09/30/13  POCT GLYCOSYLATED HEMOGLOBIN (HGB A1C)      Result Value Range   Hemoglobin A1C 8.5      Assessment and Plan: Type II or unspecified type diabetes mellitus with unspecified complication, uncontrolled - Plan: POCT glycosylated hemoglobin (Hb A1C), Comprehensive metabolic panel  Chronic renal insufficiency  Dyslipidemia - Plan: pravastatin (PRAVACHOL) 40 MG tablet  GERD (gastroesophageal reflux disease) - Plan: pantoprazole (PROTONIX) 40 MG tablet  Coronary artery disease, followed by [facility] - Plan: losartan (COZAAR) 100 MG tablet  Angina pectoris - Plan: isosorbide mononitrate (IMDUR) 60 MG 24 hr tablet, gabapentin (NEURONTIN) 300 MG capsule  HTN (hypertension) - Plan: carvedilol (COREG) 25 MG tablet, amLODipine (NORVASC) 10 MG tablet, Comprehensive metabolic panel  Angina at rest  Diabetes is not fully controlled. Will have him go up on his lantus by 2 units every 2 days as long as FBG is more than 150.  Will touch base when I get his other labs back.   Asked him to please recheck in 3 months BP control is acceptable  Wt Readings from  Last 3 Encounters:  09/30/13 236 lb 3.2 oz (107.14 kg)  05/28/13 225 lb (102.059 kg)  05/18/13 222 lb (100.699 kg)   He does continue to gain weight.  Talked to him about diet; eating a lot of bread as he says is sure to make his glucose go higher. Encouraged him to keep a closer watch on his carbs.  He will try.   Signed Matthew Blinks, MD

## 2013-09-30 NOTE — Patient Instructions (Addendum)
We need you to cut down on your bread eating.  If you are eating a lot of bread your sugar is likely to continue to be high.  Check your blood sugar in the morning. If it is more than 150 you may go up on your lantus by 2 units every 2 days.  When you get to 35 units please stop and give me a call.    I will be in touch with the rest of your labs when they come in- I will send these along to your nephrologist as well  Please come and see me ins 3 months to recheck your diabetes.

## 2013-10-01 LAB — COMPREHENSIVE METABOLIC PANEL
ALBUMIN: 4.6 g/dL (ref 3.5–5.2)
ALT: 16 U/L (ref 0–53)
AST: 15 U/L (ref 0–37)
Alkaline Phosphatase: 65 U/L (ref 39–117)
BILIRUBIN TOTAL: 0.5 mg/dL (ref 0.3–1.2)
BUN: 21 mg/dL (ref 6–23)
CO2: 22 mEq/L (ref 19–32)
Calcium: 9.3 mg/dL (ref 8.4–10.5)
Chloride: 106 mEq/L (ref 96–112)
Creat: 2 mg/dL — ABNORMAL HIGH (ref 0.50–1.35)
GLUCOSE: 244 mg/dL — AB (ref 70–99)
POTASSIUM: 4.6 meq/L (ref 3.5–5.3)
Sodium: 137 mEq/L (ref 135–145)
Total Protein: 7.3 g/dL (ref 6.0–8.3)

## 2013-10-05 ENCOUNTER — Ambulatory Visit: Payer: Medicare HMO | Admitting: Family Medicine

## 2013-10-14 ENCOUNTER — Ambulatory Visit: Payer: Medicare PPO | Admitting: Cardiology

## 2013-10-20 ENCOUNTER — Other Ambulatory Visit: Payer: Self-pay | Admitting: Family Medicine

## 2013-11-04 ENCOUNTER — Encounter: Payer: Self-pay | Admitting: Family Medicine

## 2013-11-10 ENCOUNTER — Ambulatory Visit (INDEPENDENT_AMBULATORY_CARE_PROVIDER_SITE_OTHER): Payer: Commercial Managed Care - HMO | Admitting: Cardiology

## 2013-11-10 ENCOUNTER — Encounter: Payer: Self-pay | Admitting: Cardiology

## 2013-11-10 VITALS — BP 139/87 | HR 80 | Ht 65.5 in | Wt 243.8 lb

## 2013-11-10 DIAGNOSIS — I209 Angina pectoris, unspecified: Secondary | ICD-10-CM

## 2013-11-10 DIAGNOSIS — Q613 Polycystic kidney, unspecified: Secondary | ICD-10-CM

## 2013-11-10 DIAGNOSIS — I2510 Atherosclerotic heart disease of native coronary artery without angina pectoris: Secondary | ICD-10-CM

## 2013-11-10 NOTE — Patient Instructions (Signed)
Your physician wants you to follow-up in: [place] will receive a reminder letter in the mail two months in advance. If you don't receive a letter, please call our office to schedule the follow-up appointment.

## 2013-11-11 NOTE — Progress Notes (Signed)
Patient ID: Matthew Rowe., male   DOB: [birth year], 53 y.o.   MRN: [id number] PCP: Dr. Lorelei Pont  53 yo with history CAD s/p CABG (RIMA to RCA, anomalous RCA off left cusp), occlusion of the distal LAD, and chronic angina presents for cardiology followup.  Patient has had chronic angina for years. In 1/14, he developed progressive angina and was hospitalized.  LHC was done, showing unchanged anatomy.  RIMA-RCA was patent.  He did well after this on ranolazine.  In 4/14, he ran out of ranolazine and the chest pain returned. He was admitted overnight in 4/14 and ruled out for MI.  In 7/14, he had again run low on ranolazine and developed more chest pain.  He was admitted again and ruled out for MI.  Currently, he is back on ranolazine at 1000 mg bid.    Matthew Rowe has gained about 22 lbs since last appointment, it appears primarily due to dietary indiscretion and lack of exercise.  He has noted that he is having more angina recently.  He will get chest tightness if he walks fast for 20 yards or with sex.  He is using NTG a bit more than in the past.  No chest pain at rest or with slow walking/short distances.    Labs (7/13): K 4.9, creatinine 1.75, LDL 63 Labs (1/14): K 4.2, creatinine 1.65, TGs 483 (could not calculate LDL) Labs (4/14): K 4.8, creatinine 1.62, HCT 36.8 Labs (7/14): K 4.3, creatinine 1.63, LDL 56, HDL 34, TGs 382 Labs (1/15): K 4.6, creatinine 2  ECG: NSR, old anterior MI, old inferior MI, QT interval normal  PMH: 1. CAD: CABG 2006 at [facility] with [place].  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  Patient has had prior extensive PCI to LAD with occlusion of the distal LAD.  LHC (7/12): proximal LAD stent, extensive mid-distal LAD stenting, 60-70% mid LAD instent restenosis, subtotal distal LAD instent restenosis, LCFx without significant disease, anomalous RCA off left cusp with mild disease, patent RIMA-RCA.  Echo (3/12): EF 55-60%, trivial AI, mild LVH.   Lexiscan myoview (9/13) with small apical scar, moderate inferior scar from base to apex with some reversibility, EF 47% with apical hypokinesis (similar to prior study).  Chronic angina.  LHC (1/14): total occlusion of distal LAD, diffuse disease in ramus, 70% proximal LCx, 50% proximal and mid RCA, 50% distal RCA, patent RIMA-RCA.  2. OSA: CPAP. 3. Type II diabetes 4. Hyperlipidemia: had side effects from Lipitor.  5. HTN 6. CKD: polycystic kidney disease.  7. Ischemic cardiomyopathy: EF 55-60% by echo in 3/12, EF 47% with apical hypokinesis in 9/13.   SH: Nonsmoker.  Married, lives in [place].  On disability.   FH: Multiple family members with MIs.   ROS: All systems reviewed and negative except as per HPI.   Current Outpatient Prescriptions  Medication Sig Dispense Refill  . acetaminophen (TYLENOL) 325 MG tablet Take 975 mg by mouth every 6 (six) hours as needed for pain.      . Alcohol Swabs (B-D SINGLE USE SWABS REGULAR) PADS USE TO CHECK BLOOD SUGAR THREE TIMES DAILY  300 each  1  . amLODipine (NORVASC) 10 MG tablet Take 1 tablet (10 mg total) by mouth daily.  90 tablet  3  . aspirin 325 MG tablet Take 325 mg by mouth daily.       . Blood Glucose Monitoring Suppl DEVI Use to test blood sugar 3 times daily. Dx code  250.00.  1 each  0  . carvedilol (COREG) 25 MG tablet TAKE 1 AND 1/2 TABLETS TWICE DAILY WITH MEALS  270 tablet  3  . gabapentin (NEURONTIN) 300 MG capsule Take 1 capsule (300 mg total) by mouth 3 (three) times daily.  270 capsule  3  . hydrochlorothiazide (HYDRODIURIL) 25 MG tablet TAKE 1 TABLET EVERY DAY AS NEEDED  FOR  BLOOD  PRESSURE  90 tablet  1  . Insulin Glargine (LANTUS SOLOSTAR) 100 UNIT/ML Solostar Pen Inject 20-30 Units into the skin 2 (two) times daily. Usually takes 20 units, if blood sugars are elevated can take up to 30 units  15 pen  0  . Insulin Pen Needle 31G X 5 MM MISC Use one daily for insulin injection  100 each  6  . isosorbide mononitrate (IMDUR)  60 MG 24 hr tablet Take 1 tablet (60 mg total) by mouth 2 (two) times daily.  180 tablet  3  . losartan (COZAAR) 100 MG tablet Take 1 tablet (100 mg total) by mouth daily.  90 tablet  3  . nitroGLYCERIN (NITROSTAT) 0.4 MG SL tablet Place 1 tablet (0.4 mg total) under the tongue every 5 (five) minutes as needed.  100 tablet  1  . pantoprazole (PROTONIX) 40 MG tablet Take 1 tablet (40 mg total) by mouth 2 (two) times daily with a meal.  180 tablet  3  . pravastatin (PRAVACHOL) 40 MG tablet Take 1 tablet (40 mg total) by mouth every evening.  90 tablet  3  . PRODIGY NO CODING BLOOD GLUC test strip USE TO CHECK BLOOD SUGAR THREE TIMES DAILY   300 each  1  . PRODIGY TWIST TOP LANCETS 28G MISC USE TO CHECK BLOOD SUGAR THREE TIMES DAILY   300 each  2  . ranolazine (RANEXA) 1000 MG SR tablet Take 1 tablet (1,000 mg total) by mouth 2 (two) times daily.  180 tablet  3  . vitamin E 1000 UNIT capsule Take 1,000 Units by mouth 2 (two) times daily.        Marland Kitchen zolpidem (AMBIEN) 10 MG tablet Take 10 mg by mouth at bedtime as needed. For insomnia       No current facility-administered medications for this visit.    BP 139/87  Pulse 80  Ht 5' 5.5" (1.664 m)  Wt 243 lb 12.8 oz (110.587 kg)  BMI 39.94 kg/m2 General: NAD, obese.  Neck: No JVD, no thyromegaly or thyroid nodule.  Lungs: Clear to auscultation bilaterally with normal respiratory effort. CV: Nondisplaced PMI.  Heart regular S1/S2, no S3/S4, no murmur.  No peripheral edema.  No carotid bruit.  Normal pedal pulses.  Abdomen: Soft, nontender, no hepatosplenomegaly, no distention.  Neurologic: Alert and oriented x 3.  Psych: Normal affect. Extremities: No clubbing or cyanosis.   Assessment/Plan: 1. CAD: Chronic angina.  A bit worse in recent months with significant weight increase.  He is on maximal doses of anti-anginals at this time. ECG today showed normal QT interval (on ranolazine). LHC in 1/14 showed stable anatomy.  He will continue ASA 81,  statin, Coreg, amlodipine, ARB, and Imdur.  For angina, would continue current doses of Coreg, amlodipine, ranolazine, and Imdur.  I think that increasing weight may have worsened his angina.  I encouraged him to work aggressively on weight loss (diet, try to do some walking).   2. HTN: BP upper normal.  Weight loss likely would help.  3. Hyperlipidemia: Good LDL in 7/14.  4. CKD:  Patient has APKD.  Creatinine seems to be running higher, up to 2 when last checked in 1/15. I encouraged him to followup with nephrology.   [name] 11/11/2013

## 2013-11-26 ENCOUNTER — Ambulatory Visit: Payer: Medicare PPO | Admitting: Cardiology

## 2013-11-26 ENCOUNTER — Emergency Department (HOSPITAL_COMMUNITY): Payer: Medicare PPO

## 2013-11-26 ENCOUNTER — Observation Stay (HOSPITAL_COMMUNITY)
Admission: EM | Admit: 2013-11-26 | Discharge: 2013-11-29 | Disposition: A | Discharge status: Home / Self Care | Payer: Medicare PPO | Attending: Cardiology | Admitting: Cardiology

## 2013-11-26 ENCOUNTER — Encounter (HOSPITAL_COMMUNITY): Payer: Self-pay | Admitting: Emergency Medicine

## 2013-11-26 DIAGNOSIS — Z9861 Coronary angioplasty status: Secondary | ICD-10-CM | POA: Insufficient documentation

## 2013-11-26 DIAGNOSIS — IMO0002 Reserved for concepts with insufficient information to code with codable children: Secondary | ICD-10-CM

## 2013-11-26 DIAGNOSIS — N183 Chronic kidney disease, stage 3 unspecified: Secondary | ICD-10-CM | POA: Insufficient documentation

## 2013-11-26 DIAGNOSIS — I209 Angina pectoris, unspecified: Secondary | ICD-10-CM

## 2013-11-26 DIAGNOSIS — I152 Hypertension secondary to endocrine disorders: Secondary | ICD-10-CM | POA: Diagnosis present

## 2013-11-26 DIAGNOSIS — E781 Pure hyperglyceridemia: Secondary | ICD-10-CM | POA: Insufficient documentation

## 2013-11-26 DIAGNOSIS — R079 Chest pain, unspecified: Principal | ICD-10-CM | POA: Insufficient documentation

## 2013-11-26 DIAGNOSIS — I10 Essential (primary) hypertension: Secondary | ICD-10-CM | POA: Diagnosis present

## 2013-11-26 DIAGNOSIS — E1169 Type 2 diabetes mellitus with other specified complication: Secondary | ICD-10-CM | POA: Diagnosis present

## 2013-11-26 DIAGNOSIS — G4733 Obstructive sleep apnea (adult) (pediatric): Secondary | ICD-10-CM | POA: Insufficient documentation

## 2013-11-26 DIAGNOSIS — E119 Type 2 diabetes mellitus without complications: Secondary | ICD-10-CM | POA: Insufficient documentation

## 2013-11-26 DIAGNOSIS — E669 Obesity, unspecified: Secondary | ICD-10-CM

## 2013-11-26 DIAGNOSIS — R072 Precordial pain: Secondary | ICD-10-CM | POA: Diagnosis present

## 2013-11-26 DIAGNOSIS — E1165 Type 2 diabetes mellitus with hyperglycemia: Secondary | ICD-10-CM

## 2013-11-26 DIAGNOSIS — Q613 Polycystic kidney, unspecified: Secondary | ICD-10-CM | POA: Insufficient documentation

## 2013-11-26 DIAGNOSIS — K219 Gastro-esophageal reflux disease without esophagitis: Secondary | ICD-10-CM | POA: Insufficient documentation

## 2013-11-26 DIAGNOSIS — Z951 Presence of aortocoronary bypass graft: Secondary | ICD-10-CM | POA: Insufficient documentation

## 2013-11-26 DIAGNOSIS — I252 Old myocardial infarction: Secondary | ICD-10-CM | POA: Insufficient documentation

## 2013-11-26 DIAGNOSIS — Z7982 Long term (current) use of aspirin: Secondary | ICD-10-CM | POA: Insufficient documentation

## 2013-11-26 DIAGNOSIS — I200 Unstable angina: Secondary | ICD-10-CM

## 2013-11-26 DIAGNOSIS — E785 Hyperlipidemia, unspecified: Secondary | ICD-10-CM

## 2013-11-26 DIAGNOSIS — I129 Hypertensive chronic kidney disease with stage 1 through stage 4 chronic kidney disease, or unspecified chronic kidney disease: Secondary | ICD-10-CM | POA: Insufficient documentation

## 2013-11-26 DIAGNOSIS — Z794 Long term (current) use of insulin: Secondary | ICD-10-CM | POA: Insufficient documentation

## 2013-11-26 DIAGNOSIS — I2510 Atherosclerotic heart disease of native coronary artery without angina pectoris: Secondary | ICD-10-CM | POA: Insufficient documentation

## 2013-11-26 MED ORDER — ONDANSETRON HCL 4 MG/2ML IJ SOLN
4.0000 mg | Freq: Once | INTRAMUSCULAR | Status: AC
  Administered 2013-11-27: 4 mg via INTRAVENOUS
  Filled 2013-11-26: qty 2

## 2013-11-26 MED ORDER — NITROGLYCERIN 0.4 MG SL SUBL: 0.4000 mg | SUBLINGUAL_TABLET | SUBLINGUAL | Status: DC | PRN

## 2013-11-26 MED ORDER — ASPIRIN 81 MG PO CHEW
324.0000 mg | CHEWABLE_TABLET | Freq: Once | ORAL | Status: AC
  Administered 2013-11-27: 324 mg via ORAL
  Filled 2013-11-26: qty 4

## 2013-11-26 MED ORDER — MORPHINE SULFATE 4 MG/ML IJ SOLN
4.0000 mg | Freq: Once | INTRAMUSCULAR | Status: AC
  Administered 2013-11-27: 4 mg via INTRAVENOUS
  Filled 2013-11-26: qty 1

## 2013-11-26 NOTE — ED Notes (Signed)
Pt. reports mid  chest pain with SOB onset this evening , pt. Took ASA 325 mg and 6 NTG sl prior to arrival with no relief .

## 2013-11-27 ENCOUNTER — Encounter (HOSPITAL_COMMUNITY): Payer: Self-pay | Admitting: Internal Medicine

## 2013-11-27 DIAGNOSIS — N183 Chronic kidney disease, stage 3 unspecified: Secondary | ICD-10-CM

## 2013-11-27 DIAGNOSIS — IMO0001 Reserved for inherently not codable concepts without codable children: Secondary | ICD-10-CM

## 2013-11-27 DIAGNOSIS — I209 Angina pectoris, unspecified: Secondary | ICD-10-CM

## 2013-11-27 DIAGNOSIS — R072 Precordial pain: Secondary | ICD-10-CM | POA: Diagnosis present

## 2013-11-27 DIAGNOSIS — E785 Hyperlipidemia, unspecified: Secondary | ICD-10-CM

## 2013-11-27 DIAGNOSIS — Q613 Polycystic kidney, unspecified: Secondary | ICD-10-CM

## 2013-11-27 DIAGNOSIS — I10 Essential (primary) hypertension: Secondary | ICD-10-CM

## 2013-11-27 DIAGNOSIS — R079 Chest pain, unspecified: Secondary | ICD-10-CM

## 2013-11-27 DIAGNOSIS — E669 Obesity, unspecified: Secondary | ICD-10-CM

## 2013-11-27 DIAGNOSIS — E119 Type 2 diabetes mellitus without complications: Secondary | ICD-10-CM

## 2013-11-27 DIAGNOSIS — I2510 Atherosclerotic heart disease of native coronary artery without angina pectoris: Secondary | ICD-10-CM

## 2013-11-27 DIAGNOSIS — E1165 Type 2 diabetes mellitus with hyperglycemia: Secondary | ICD-10-CM

## 2013-11-27 DIAGNOSIS — I200 Unstable angina: Secondary | ICD-10-CM

## 2013-11-27 LAB — GLUCOSE, CAPILLARY
GLUCOSE-CAPILLARY: 222 mg/dL — AB (ref 70–99)
GLUCOSE-CAPILLARY: 244 mg/dL — AB (ref 70–99)
Glucose-Capillary: 283 mg/dL — ABNORMAL HIGH (ref 70–99)
Glucose-Capillary: 363 mg/dL — ABNORMAL HIGH (ref 70–99)

## 2013-11-27 LAB — BASIC METABOLIC PANEL
BUN: 24 mg/dL — ABNORMAL HIGH (ref 6–23)
BUN: 25 mg/dL — ABNORMAL HIGH (ref 6–23)
CHLORIDE: 104 meq/L (ref 96–112)
CO2: 22 mEq/L (ref 19–32)
CO2: 21 meq/L (ref 19–32)
CREATININE: 1.85 mg/dL — AB (ref 0.50–1.35)
Calcium: 9.3 mg/dL (ref 8.4–10.5)
Calcium: 9.5 mg/dL (ref 8.4–10.5)
Chloride: 100 mEq/L (ref 96–112)
Creatinine, Ser: 1.93 mg/dL — ABNORMAL HIGH (ref 0.50–1.35)
GFR calc Af Amer: 44 mL/min — ABNORMAL LOW (ref >90)
GFR, EST AFRICAN AMERICAN: 46 mL/min — AB (ref >90)
GFR, EST NON AFRICAN AMERICAN: 40 mL/min — AB (ref >90)
GFR, EST NON AFRICAN AMERICAN: 38 mL/min — AB (ref >90)
Glucose, Bld: 243 mg/dL — ABNORMAL HIGH (ref 70–99)
Glucose, Bld: 389 mg/dL — ABNORMAL HIGH (ref 70–99)
POTASSIUM: 4.5 meq/L (ref 3.7–5.3)
Potassium: 4.5 mEq/L (ref 3.7–5.3)
Sodium: 137 mEq/L (ref 137–147)
Sodium: 141 mEq/L (ref 137–147)

## 2013-11-27 LAB — LIPID PANEL
Cholesterol: 190 mg/dL (ref 0–200)
HDL: 32 mg/dL — ABNORMAL LOW (ref >39)
LDL CALC: UNDETERMINED mg/dL (ref 0–99)
Total CHOL/HDL Ratio: 5.9 RATIO
Triglycerides: 605 mg/dL — ABNORMAL HIGH (ref <150)
VLDL: UNDETERMINED mg/dL (ref 0–40)

## 2013-11-27 LAB — PROTIME-INR
INR: 0.82 (ref 0.00–1.49)
INR: 0.97 (ref 0.00–1.49)
PROTHROMBIN TIME: 11.2 s — AB (ref 11.6–15.2)
Prothrombin Time: 12.7 seconds (ref 11.6–15.2)

## 2013-11-27 LAB — CBC
HCT: 37.9 % — ABNORMAL LOW (ref 39.0–52.0)
HCT: 40.1 % (ref 39.0–52.0)
HEMOGLOBIN: 14 g/dL (ref 13.0–17.0)
Hemoglobin: 13.3 g/dL (ref 13.0–17.0)
MCH: 29.4 pg (ref 26.0–34.0)
MCH: 29.6 pg (ref 26.0–34.0)
MCHC: 35.1 g/dL (ref 30.0–36.0)
MCHC: 34.9 g/dL (ref 30.0–36.0)
MCV: 84.1 fL (ref 78.0–100.0)
MCV: 84.2 fL (ref 78.0–100.0)
PLATELETS: 220 10*3/uL (ref 150–400)
Platelets: 224 10*3/uL (ref 150–400)
RBC: 4.5 MIL/uL (ref 4.22–5.81)
RBC: 4.77 MIL/uL (ref 4.22–5.81)
RDW: 13 % (ref 11.5–15.5)
RDW: 13.2 % (ref 11.5–15.5)
WBC: 7.3 10*3/uL (ref 4.0–10.5)
WBC: 7.6 10*3/uL (ref 4.0–10.5)

## 2013-11-27 LAB — APTT: aPTT: 57 seconds — ABNORMAL HIGH (ref 24–37)

## 2013-11-27 LAB — TROPONIN I: Troponin I: <0.3 ng/mL (ref <0.30)

## 2013-11-27 LAB — I-STAT TROPONIN, ED: Troponin i, poc: 0 ng/mL (ref 0.00–0.08)

## 2013-11-27 LAB — HEPARIN LEVEL (UNFRACTIONATED)
HEPARIN UNFRACTIONATED: 0.55 [IU]/mL (ref 0.30–0.70)
Heparin Unfractionated: 0.23 IU/mL — ABNORMAL LOW (ref 0.30–0.70)

## 2013-11-27 LAB — PRO B NATRIURETIC PEPTIDE: PRO B NATRI PEPTIDE: 172.2 pg/mL — AB (ref 0–125)

## 2013-11-27 LAB — HEMOGLOBIN A1C
Hgb A1c MFr Bld: 10.2 % — ABNORMAL HIGH (ref <5.7)
Mean Plasma Glucose: 246 mg/dL — ABNORMAL HIGH (ref <117)

## 2013-11-27 LAB — TSH: TSH: 6.234 u[IU]/mL — ABNORMAL HIGH (ref 0.350–4.500)

## 2013-11-27 MED ORDER — ASPIRIN 325 MG PO TABS
325.0000 mg | ORAL_TABLET | Freq: Every day | ORAL | Status: DC
  Administered 2013-11-27 – 2013-11-29 (×3): 325 mg via ORAL
  Filled 2013-11-27 (×3): qty 1

## 2013-11-27 MED ORDER — SIMVASTATIN 20 MG PO TABS
20.0000 mg | ORAL_TABLET | Freq: Every day | ORAL | Status: DC
  Administered 2013-11-27 – 2013-11-28 (×2): 20 mg via ORAL
  Filled 2013-11-27 (×3): qty 1

## 2013-11-27 MED ORDER — ASPIRIN 81 MG PO CHEW: 324.0000 mg | CHEWABLE_TABLET | ORAL | Status: DC

## 2013-11-27 MED ORDER — RANOLAZINE ER 500 MG PO TB12
1000.0000 mg | ORAL_TABLET | Freq: Two times a day (BID) | ORAL | Status: DC
  Administered 2013-11-27 – 2013-11-29 (×5): 1000 mg via ORAL
  Filled 2013-11-27 (×6): qty 2

## 2013-11-27 MED ORDER — LOSARTAN POTASSIUM 50 MG PO TABS
100.0000 mg | ORAL_TABLET | Freq: Every day | ORAL | Status: DC
  Administered 2013-11-27 – 2013-11-29 (×3): 100 mg via ORAL
  Filled 2013-11-27 (×3): qty 2

## 2013-11-27 MED ORDER — INSULIN ASPART 100 UNIT/ML ~~LOC~~ SOLN
0.0000 [IU] | Freq: Three times a day (TID) | SUBCUTANEOUS | Status: DC
  Administered 2013-11-27: 20 [IU] via SUBCUTANEOUS
  Administered 2013-11-28: 4 [IU] via SUBCUTANEOUS
  Administered 2013-11-28: 7 [IU] via SUBCUTANEOUS
  Administered 2013-11-28: 20 [IU] via SUBCUTANEOUS
  Administered 2013-11-29: 7 [IU] via SUBCUTANEOUS
  Administered 2013-11-29: 3 [IU] via SUBCUTANEOUS

## 2013-11-27 MED ORDER — GABAPENTIN 300 MG PO CAPS
300.0000 mg | ORAL_CAPSULE | Freq: Three times a day (TID) | ORAL | Status: DC
  Administered 2013-11-27 – 2013-11-29 (×7): 300 mg via ORAL
  Filled 2013-11-27 (×9): qty 1

## 2013-11-27 MED ORDER — HEPARIN (PORCINE) IN NACL 100-0.45 UNIT/ML-% IJ SOLN
1250.0000 [IU]/h | INTRAMUSCULAR | Status: DC
  Administered 2013-11-27 (×2): 1200 [IU]/h via INTRAVENOUS
  Administered 2013-11-28: 1350 [IU]/h via INTRAVENOUS
  Administered 2013-11-29: 1250 [IU]/h via INTRAVENOUS
  Filled 2013-11-27 (×6): qty 250

## 2013-11-27 MED ORDER — MORPHINE SULFATE 2 MG/ML IJ SOLN
2.0000 mg | Freq: Once | INTRAMUSCULAR | Status: AC
  Administered 2013-11-27: 2 mg via INTRAVENOUS
  Filled 2013-11-27: qty 1

## 2013-11-27 MED ORDER — AMLODIPINE BESYLATE 10 MG PO TABS
10.0000 mg | ORAL_TABLET | Freq: Every day | ORAL | Status: DC
  Administered 2013-11-27 – 2013-11-29 (×3): 10 mg via ORAL
  Filled 2013-11-27 (×3): qty 1

## 2013-11-27 MED ORDER — ACETAMINOPHEN 325 MG PO TABS
975.0000 mg | ORAL_TABLET | Freq: Four times a day (QID) | ORAL | Status: DC | PRN
  Administered 2013-11-29: 975 mg via ORAL
  Filled 2013-11-27: qty 3

## 2013-11-27 MED ORDER — HEPARIN BOLUS VIA INFUSION
4000.0000 [IU] | Freq: Once | INTRAVENOUS | Status: AC
  Administered 2013-11-27: 4000 [IU] via INTRAVENOUS
  Filled 2013-11-27: qty 4000

## 2013-11-27 MED ORDER — PANTOPRAZOLE SODIUM 40 MG PO TBEC
40.0000 mg | DELAYED_RELEASE_TABLET | Freq: Two times a day (BID) | ORAL | Status: DC
  Administered 2013-11-27 – 2013-11-29 (×5): 40 mg via ORAL
  Filled 2013-11-27 (×5): qty 1

## 2013-11-27 MED ORDER — NITROGLYCERIN 0.4 MG SL SUBL: 0.4000 mg | SUBLINGUAL_TABLET | SUBLINGUAL | Status: DC | PRN

## 2013-11-27 MED ORDER — ISOSORBIDE MONONITRATE ER 60 MG PO TB24
60.0000 mg | ORAL_TABLET | Freq: Two times a day (BID) | ORAL | Status: DC
  Administered 2013-11-27 – 2013-11-29 (×5): 60 mg via ORAL
  Filled 2013-11-27 (×8): qty 1

## 2013-11-27 MED ORDER — VITAMIN E 45 MG (100 UNIT) PO CAPS
1000.0000 [IU] | ORAL_CAPSULE | Freq: Two times a day (BID) | ORAL | Status: DC
  Administered 2013-11-27 – 2013-11-29 (×5): 1000 [IU] via ORAL
  Filled 2013-11-27 (×6): qty 2

## 2013-11-27 MED ORDER — ASPIRIN 300 MG RE SUPP: 300.0000 mg | RECTAL | Status: DC

## 2013-11-27 MED ORDER — ZOLPIDEM TARTRATE 5 MG PO TABS
5.0000 mg | ORAL_TABLET | Freq: Every evening | ORAL | Status: DC | PRN
  Administered 2013-11-27 – 2013-11-29 (×2): 5 mg via ORAL
  Filled 2013-11-27 (×2): qty 1

## 2013-11-27 MED ORDER — ASPIRIN EC 81 MG PO TBEC: 81.0000 mg | DELAYED_RELEASE_TABLET | Freq: Every day | ORAL | Status: DC

## 2013-11-27 MED ORDER — INSULIN GLARGINE 100 UNIT/ML ~~LOC~~ SOLN
20.0000 [IU] | Freq: Two times a day (BID) | SUBCUTANEOUS | Status: DC
  Administered 2013-11-27 – 2013-11-29 (×5): 20 [IU] via SUBCUTANEOUS
  Filled 2013-11-27 (×6): qty 0.2

## 2013-11-27 MED ORDER — CARVEDILOL 25 MG PO TABS
37.5000 mg | ORAL_TABLET | Freq: Two times a day (BID) | ORAL | Status: DC
  Administered 2013-11-27 – 2013-11-29 (×5): 37.5 mg via ORAL
  Filled 2013-11-27 (×8): qty 1

## 2013-11-27 NOTE — ED Notes (Signed)
Transporting patient to new room assignment. 

## 2013-11-27 NOTE — Progress Notes (Signed)
ANTICOAGULATION CONSULT NOTE - Follow Up Consult  Pharmacy Consult for Heparin Indication: chest pain/ACS  Allergies  Allergen Reactions  . Ciprocin-Fluocin-Procin [Fluocinolone Acetonide] Rash  . Clarithromycin Itching and Other (See Comments)    "Biaxin" Eyes itch and burn  . Glimepiride [Amaryl] Other (See Comments)    Elevates liver function     Patient Measurements: Height: 5\' 5"  (165.1 cm) Weight: 238 lb 1.6 oz (108 kg) IBW/kg (Calculated) : 61.5 Heparin Dosing Weight:   Vital Signs: Temp: 97.1 F (36.2 C) (03/20 1452) Temp src: Oral (03/20 1452) BP: 130/88 mmHg (03/20 1452) Pulse Rate: 70 (03/20 1452)  Labs:  Recent Labs  11/26/13 2350 11/27/13 0543 11/27/13 0930 11/27/13 0950 11/27/13 1548 11/27/13 1958  HGB 14.0 13.3  --   --   --   --   HCT 40.1 37.9*  --   --   --   --   PLT 224 220  --   --   --   --   APTT  --  57*  --   --   --   --   LABPROT  --  11.2*  --  12.7  --   --   INR  --  0.82  --  0.97  --   --   HEPARINUNFRC  --   --   --  0.23*  --  0.55  CREATININE 1.93* 1.85*  --   --   --   --   TROPONINI  --  <0.30 <0.30  --  <0.30  --     Estimated Creatinine Clearance: 52.3 ml/min (by C-G formula based on Cr of 1.85).   Medications:  Scheduled:  . amLODipine  10 mg Oral Daily  . aspirin  325 mg Oral Daily  . carvedilol  37.5 mg Oral BID WC  . gabapentin  300 mg Oral TID  . insulin aspart  0-20 Units Subcutaneous TID WC  . insulin glargine  20 Units Subcutaneous BID  . isosorbide mononitrate  60 mg Oral BID WC  . losartan  100 mg Oral Daily  . pantoprazole  40 mg Oral BID WC  . ranolazine  1,000 mg Oral BID  . simvastatin  20 mg Oral q1800  . vitamin E  1,000 Units Oral BID    Assessment: 53yo male with chest pain, plans for myoview in AM if R/O MI.  Heparin level this AM subtherapeutic, Hg & pltc wnl.  No bleeding noted. Heparin level 0.55 now in goal range   Goal of Therapy:  Heparin level 0.3-0.7 units/ml Monitor platelets  by anticoagulation protocol: Yes   Plan:  1-  Continue heparin at 1350 units/hr 2-  Check HL in AM  Mishti Swanton S. Alford Highland, PharmD, [facility] Clinical Staff Pharmacist Pager [phone]

## 2013-11-27 NOTE — Progress Notes (Signed)
ANTICOAGULATION CONSULT NOTE - Initial Consult  Pharmacy Consult for Heparin  Indication: chest pain/ACS  Allergies  Allergen Reactions  . Ciprocin-Fluocin-Procin [Fluocinolone Acetonide] Rash  . Clarithromycin Itching and Other (See Comments)    "Biaxin" Eyes itch and burn  . Glimepiride [Amaryl] Other (See Comments)    Elevates liver function     Patient Measurements: Height: 5\' 5"  (165.1 cm) Weight: 255 lb (115.667 kg) IBW/kg (Calculated) : 61.5 Heparin Dosing Weight: 88 kg  Vital Signs: Temp: 98.8 F (37.1 C) (03/19 2343) Temp src: Oral (03/19 2343) BP: 134/95 mmHg (03/20 0115) Pulse Rate: 75 (03/20 0115)  Labs:  Recent Labs  11/26/13 2350  HGB 14.0  HCT 40.1  PLT 224  CREATININE 1.93*    Estimated Creatinine Clearance: 52.1 ml/min (by C-G formula based on Cr of 1.93).   Medical History: Past Medical History  Diagnosis Date  . Chest pain     Chronic  . Dyslipidemia   . Coronary artery disease     a. INF MI 1999: PCI of RCA;  b.  extensive stenting of LAD;  c. s/p CABG with RIMA->RCA in 2006;  d. Stable, Low risk MV 05/2012; e. 09/2012 Cath: LM 40-50d, LAD 30 isr, patent mid stents, 100d, LCX 70p, RI diff dzs, RCA (anomalous take off) 50p, patent RIMA->med rx.  . Hypertension   . Hx of echocardiogram     a. Echo 3/12:  mild LVH, EF 55-60%, trivial AI  . DM2 (diabetes mellitus, type 2)   . History of blood transfusion ?2011  . GERD (gastroesophageal reflux disease)     "once; came to hospital" (09/15/2012)  . OSA on CPAP   . CKD (chronic kidney disease)   . Polycystic kidney disease     Assessment: 53 y/o M to start heparin for CP. Labs as above, noted slight bump in Scr.   Goal of Therapy:  Heparin level 0.3-0.7 units/ml Monitor platelets by anticoagulation protocol: Yes   Plan:  -Heparin 4000 units BOLUS x 1 -Start heparin drip at 1200 units/hr -1000 HL -Daily CBC/HL -Monitor for bleeding  Narda Bonds 11/27/2013,1:33 AM

## 2013-11-27 NOTE — ED Notes (Signed)
Wife stated the patient went for a walk and then came home and was playing with his grandchild.  Started with mid sternal chest pain that was unrelieved with 6 NTG.

## 2013-11-27 NOTE — Progress Notes (Signed)
Patient O2 was in the 80s on room air. Pt put on oxygen at 2l.

## 2013-11-27 NOTE — Progress Notes (Signed)
ANTICOAGULATION CONSULT NOTE - Follow Up Consult  Pharmacy Consult for Heparin Indication: chest pain/ACS  Allergies  Allergen Reactions  . Ciprocin-Fluocin-Procin [Fluocinolone Acetonide] Rash  . Clarithromycin Itching and Other (See Comments)    "Biaxin" Eyes itch and burn  . Glimepiride [Amaryl] Other (See Comments)    Elevates liver function     Patient Measurements: Height: 5\' 5"  (165.1 cm) Weight: 238 lb 1.6 oz (108 kg) IBW/kg (Calculated) : 61.5 Heparin Dosing Weight:   Vital Signs: Temp: 97.6 F (36.4 C) (03/20 0314) Temp src: Oral (03/20 0314) BP: 155/79 mmHg (03/20 0314) Pulse Rate: 84 (03/20 0314)  Labs:  Recent Labs  11/26/13 2350 11/27/13 0543 11/27/13 0930 11/27/13 0950  HGB 14.0 13.3  --   --   HCT 40.1 37.9*  --   --   PLT 224 220  --   --   APTT  --  57*  --   --   LABPROT  --  11.2*  --  12.7  INR  --  0.82  --  0.97  HEPARINUNFRC  --   --   --  0.23*  CREATININE 1.93* 1.85*  --   --   TROPONINI  --  <0.30 <0.30  --     Estimated Creatinine Clearance: 52.3 ml/min (by C-G formula based on Cr of 1.85).   Medications:  Scheduled:  . amLODipine  10 mg Oral Daily  . aspirin  325 mg Oral Daily  . carvedilol  37.5 mg Oral BID WC  . gabapentin  300 mg Oral TID  . insulin glargine  20 Units Subcutaneous BID  . isosorbide mononitrate  60 mg Oral BID WC  . losartan  100 mg Oral Daily  . pantoprazole  40 mg Oral BID WC  . ranolazine  1,000 mg Oral BID  . simvastatin  20 mg Oral q1800  . vitamin E  1,000 Units Oral BID    Assessment: 54yo male with chest pain, plans for myoview in AM if R/O MI.  Heparin level this AM subtherapeutic, Hg & pltc wnl.  No bleeding noted.   Goal of Therapy:  Heparin level 0.3-0.7 units/ml Monitor platelets by anticoagulation protocol: Yes   Plan:  1-  Increase heparin to 1350 units/hr 2-  Check HL in [place], PharmD Clinical Pharmacist [facility]

## 2013-11-27 NOTE — Progress Notes (Signed)
Inpatient Diabetes Program Recommendations  AACE/ADA: New Consensus Statement on Inpatient Glycemic Control (2013)  Target Ranges:  Prepandial:   less than 140 mg/dL      Peak postprandial:   less than 180 mg/dL (1-2 hours)      Critically ill patients:  140 - 180 mg/dL   Inpatient Diabetes Program Recommendations Correction (SSI): Novolog Resistant scale TID + HS  HgbA1C: order to assess prehospital glucose control  Thank you  Raoul Pitch BSN, RN,CDE Inpatient Diabetes Coordinator [phone] (team pager)

## 2013-11-27 NOTE — H&P (Signed)
DEMONTRAE GILBERT Sr. is an 53 y.o. male.     Chief Complaint: chest pain Primary Cardiologist: Dr. Aundra Dubin HPI: Mr. 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 He saw Dr. Aundra Dubin on 11/11/13, where he was noted to have more angina than usual being treated with ranolazine 1000 mg bid, coreg, amlodipine and imdur as well as 20 lb weight gain. He tells me the pain in his chest, pressure sensation, is particularly worse with walking and persists until several minutes of rest particularly today when he had more than 30 minutes of pain after walking prompting presentation. His pain finally relieved in the ER after SL NTG and heparin gtt initiated.   Past Medical History  Diagnosis Date  . Chest pain     Chronic  . Dyslipidemia   . Coronary artery disease     a. INF MI 1999: PCI of RCA;  b.  extensive stenting of LAD;  c. s/p CABG with RIMA->RCA in 2006;  d. Stable, Low risk MV 05/2012; e. 09/2012 Cath: LM 40-50d, LAD 30 isr, patent mid stents, 100d, LCX 70p, RI diff dzs, RCA (anomalous take off) 50p, patent RIMA->med rx.  . Hypertension   . Hx of echocardiogram     a. Echo 3/12:  mild LVH, EF 55-60%, trivial AI  . DM2 (diabetes mellitus, type 2)   . History of blood transfusion ?2011  . GERD (gastroesophageal reflux disease)     "once; came to hospital" (09/15/2012)  . OSA on CPAP   . CKD (chronic kidney disease)   . Polycystic kidney disease     Past Surgical History  Procedure Laterality Date  . Cystectomy  1990's    off face  . Coronary angioplasty with stent placement  1999 / 2002 / 2004 / 2006?  Marland Kitchen Cardiac catheterization  2012  . Coronary artery bypass  graft  2006    CABG X?; in [place]  . Cardiac catheterization  2013  . Cardiac catheterization  2014    LHC (1/14): total occlusion of distal LAD, diffuse disease in ramus, 70% proximal LCx, 50% proximal and mid RCA, 50% distal RCA, patent RIMA-RCA.    Family History  Problem Relation Age of Onset  . Cancer Mother   . Anemia Mother   . Kidney disease Mother   . Diabetes Father   . Heart disease Father   . Heart attack Father   . Heart failure Father   . Hyperlipidemia Father   . Hypertension Father   . Sudden death Father   . Kidney failure Sister 5  . Kidney disease Sister   . Heart attack Sister   . Heart disease Sister   . Hypertension Sister   . Diabetes Sister   . Diabetes Brother   . Kidney disease Brother    Social History:  reports that he has never smoked. He has never used smokeless tobacco. He reports that he does not drink alcohol or use illicit drugs.  Allergies:  Allergies  Allergen Reactions  . Ciprocin-Fluocin-Procin [Fluocinolone Acetonide] Rash  . Clarithromycin Itching and Other (See Comments)    "Biaxin" Eyes itch and burn  .  Glimepiride [Amaryl] Other (See Comments)    Elevates liver function     Medications Prior to Admission  Medication Sig Dispense Refill  . Alcohol Swabs (B-D SINGLE USE SWABS REGULAR) PADS USE TO CHECK BLOOD SUGAR THREE TIMES DAILY  300 each  1  . amLODipine (NORVASC) 10 MG tablet Take 1 tablet (10 mg total) by mouth daily.  90 tablet  3  . aspirin 325 MG tablet Take 325 mg by mouth daily.       . Blood Glucose Monitoring Suppl DEVI Use to test blood sugar 3 times daily. Dx code 250.00.  1 each  0  . carvedilol (COREG) 25 MG tablet TAKE 1 AND 1/2 TABLETS TWICE DAILY WITH MEALS  270 tablet  3  . gabapentin (NEURONTIN) 300 MG capsule Take 1 capsule (300 mg total) by mouth 3 (three) times daily.  270 capsule  3  . ibuprofen (ADVIL,MOTRIN) 200 MG tablet Take 800 mg by mouth every 6 (six) hours as needed for mild pain or moderate  pain.      . Insulin Glargine (LANTUS SOLOSTAR) 100 UNIT/ML Solostar Pen Inject 20-30 Units into the skin 2 (two) times daily. Usually takes 20 units, if blood sugars are elevated can take up to 30 units  15 pen  0  . Insulin Pen Needle 31G X 5 MM MISC Use one daily for insulin injection  100 each  6  . isosorbide mononitrate (IMDUR) 60 MG 24 hr tablet Take 1 tablet (60 mg total) by mouth 2 (two) times daily.  180 tablet  3  . losartan (COZAAR) 100 MG tablet Take 1 tablet (100 mg total) by mouth daily.  90 tablet  3  . nitroGLYCERIN (NITROSTAT) 0.4 MG SL tablet Place 1 tablet (0.4 mg total) under the tongue every 5 (five) minutes as needed.  100 tablet  1  . pantoprazole (PROTONIX) 40 MG tablet Take 1 tablet (40 mg total) by mouth 2 (two) times daily with a meal.  180 tablet  3  . pravastatin (PRAVACHOL) 40 MG tablet Take 1 tablet (40 mg total) by mouth every evening.  90 tablet  3  . PRODIGY NO CODING BLOOD GLUC test strip USE TO CHECK BLOOD SUGAR THREE TIMES DAILY   300 each  1  . PRODIGY TWIST TOP LANCETS 28G MISC USE TO CHECK BLOOD SUGAR THREE TIMES DAILY   300 each  2  . ranolazine (RANEXA) 1000 MG SR tablet Take 1 tablet (1,000 mg total) by mouth 2 (two) times daily.  180 tablet  3  . vitamin E 1000 UNIT capsule Take 1,000 Units by mouth 2 (two) times daily.        Marland Kitchen zolpidem (AMBIEN) 10 MG tablet Take 10 mg by mouth at bedtime as needed. For insomnia      . acetaminophen (TYLENOL) 325 MG tablet Take 975 mg by mouth every 6 (six) hours as needed for pain.        Results for orders placed during the hospital encounter of 11/26/13 (from the past 48 hour(s))  CBC     Status: None   Collection Time    11/26/13 11:50 PM      Result Value Ref Range   WBC 7.6  4.0 - 10.5 K/uL   RBC 4.77  4.22 - 5.81 MIL/uL   Hemoglobin 14.0  13.0 - 17.0 g/dL   HCT 40.1  39.0 - 52.0 %   MCV 84.1  78.0 - 100.0 fL   MCH 29.4  26.0 - 34.0 pg   MCHC 34.9  30.0 - 36.0 g/dL   RDW 13.0  11.5 - 15.5 %   Platelets  224  150 - 400 K/uL  BASIC METABOLIC PANEL     Status: Abnormal   Collection Time    11/26/13 11:50 PM      Result Value Ref Range   Sodium 137  137 - 147 mEq/L   Potassium 4.5  3.7 - 5.3 mEq/L   Chloride 100  96 - 112 mEq/L   CO2 21  19 - 32 mEq/L   Glucose, Bld 389 (*) 70 - 99 mg/dL   BUN 25 (*) 6 - 23 mg/dL   Creatinine, Ser 1.93 (*) 0.50 - 1.35 mg/dL   Calcium 9.5  8.4 - 10.5 mg/dL   GFR calc non Af Amer 38 (*) >90 mL/min   GFR calc Af Amer 44 (*) >90 mL/min   Comment: (NOTE)     The eGFR has been calculated using the CKD EPI equation.     This calculation has not been validated in all clinical situations.     eGFR's persistently <90 mL/min signify possible Chronic Kidney     Disease.  PRO B NATRIURETIC PEPTIDE     Status: Abnormal   Collection Time    11/26/13 11:50 PM      Result Value Ref Range   Pro B Natriuretic peptide (BNP) 172.2 (*) 0 - 125 pg/mL  I-STAT TROPOININ, ED     Status: None   Collection Time    11/26/13 11:58 PM      Result Value Ref Range   Troponin i, poc 0.00  0.00 - 0.08 ng/mL   Comment 3            Comment: Due to the release kinetics of cTnI,     a negative result within the first hours     of the onset of symptoms does not rule out     myocardial infarction with certainty.     If myocardial infarction is still suspected,     repeat the test at appropriate intervals.   Dg Chest 2 View  11/26/2013   CLINICAL DATA:  Chest and left arm pain.  EXAM: CHEST  2 VIEW  COMPARISON:  PA and lateral chest 07/05/2013.  FINDINGS: Lungs are clear. Heart size is normal. No pneumothorax or pleural effusion. The patient is status post median sternotomy.  IMPRESSION: No acute disease.   Electronically Signed   By: Inge Rise M.D.   On: 11/26/2013 23:38    Review of Systems  Constitutional: Negative for fever, chills and weight loss.  HENT: Negative for ear pain.   Eyes: Negative for blurred vision and pain.  Respiratory: Positive for shortness of breath.  Negative for cough and hemoptysis.   Cardiovascular: Positive for chest pain. Negative for orthopnea.  Gastrointestinal: Negative for nausea, vomiting and abdominal pain.  Genitourinary: Negative for dysuria and hematuria.  Musculoskeletal: Negative for myalgias and neck pain.  Skin: Negative for rash.  Neurological: Negative for dizziness, tingling, tremors and headaches.  Endo/Heme/Allergies: Does not bruise/bleed easily.  Psychiatric/Behavioral: Negative for depression, suicidal ideas and substance abuse.    Blood pressure 155/79, pulse 84, temperature 97.6 F (36.4 C), temperature source Oral, resp. rate 18, height 5' 5"  (1.651 m), weight 108 kg (238 lb 1.6 oz), SpO2 95.00%. Physical Exam  Nursing note and vitals reviewed. Constitutional: He is oriented to person, place, and time. He appears well-developed and well-nourished. No  distress.  HENT:  Head: Normocephalic and atraumatic.  Nose: Nose normal.  Mouth/Throat: Oropharynx is clear and moist. No oropharyngeal exudate.  Eyes: Conjunctivae and EOM are normal. Pupils are equal, round, and reactive to light. No scleral icterus.  Neck: Normal range of motion. Neck supple. No tracheal deviation present.  2 cm above the clavicle jvp  Cardiovascular: Normal rate, regular rhythm and intact distal pulses.   Murmur heard. Respiratory: Effort normal and breath sounds normal. No respiratory distress. He has no wheezes. He has no rales.  GI: Soft. Bowel sounds are normal. He exhibits no distension. There is no tenderness. There is no rebound.  Musculoskeletal: Normal range of motion. He exhibits no edema.  Neurological: He is alert and oriented to person, place, and time. Coordination normal.  Skin: Skin is warm and dry. No rash noted. He is not diaphoretic. No erythema.  Psychiatric: He has a normal mood and affect. His behavior is normal. Thought content normal.    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 Differential diagnosis is stable angina, weight gain and dyspnea, atypical chest pain, esophageal spasm among other etiologies. He has known CAD with scattered CAD from 01/14 CAD. I favor a diagnosis of unstable angina given history of disease and characteristic anginal pain worse with exertion. He was started on IV heparin in the ER. NPO after MN. He has a complicated medical history but I lean towards LHC although there is a higher risk of contrast nephropathy given his known CKD.  - telemetry, trend cardiac biomarkers - continue anti-anginal medications from home - amlodipine, coreg, imdur, ranexa - on heparin gtt - update TSH, lipid panel, hba1c - NPO   Palmira Stickle 11/27/2013, 3:32 AM

## 2013-11-27 NOTE — ED Provider Notes (Signed)
CSN: [id number]     Arrival date & time 11/26/13  2312 History   First MD Initiated Contact with Patient 11/26/13 2334     Chief Complaint  Patient presents with  . Chest Pain     (Consider location/radiation/quality/duration/timing/severity/associated sxs/prior Treatment) HPI History per patient. Chest pain onset this afternoon while walking, partially relieved by rest. Pain persisted tonight despite taking nitroglycerin at home. Pain also radiating to his left arm which is not typical for him. Pain described as pressure-like moderate to severe. No back pain. Did have associated shortness of breath with severe symptoms. No diaphoresis. Some nausea no vomiting. No leg pain or leg swelling. No fevers or chills. History of MI, CABG in the past - followed by cardiology Dr. Kirk Ruths  Past Medical History  Diagnosis Date  . Chest pain     Chronic  . Dyslipidemia   . Coronary artery disease     a. INF MI 1999: PCI of RCA;  b.  extensive stenting of LAD;  c. s/p CABG with RIMA->RCA in 2006;  d. Stable, Low risk MV 05/2012; e. 09/2012 Cath: LM 40-50d, LAD 30 isr, patent mid stents, 100d, LCX 70p, RI diff dzs, RCA (anomalous take off) 50p, patent RIMA->med rx.  . Hypertension   . Hx of echocardiogram     a. Echo 3/12:  mild LVH, EF 55-60%, trivial AI  . DM2 (diabetes mellitus, type 2)   . History of blood transfusion ?2011  . GERD (gastroesophageal reflux disease)     "once; came to hospital" (09/15/2012)  . OSA on CPAP   . CKD (chronic kidney disease)   . Polycystic kidney disease    Past Surgical History  Procedure Laterality Date  . Cystectomy  1990's    off face  . Coronary angioplasty with stent placement  1999 / 2002 / 2004 / 2006?  Marland Kitchen Cardiac catheterization  2012  . Coronary artery bypass graft  2006    CABG X?; in [place]  . Cardiac catheterization  2013  . Cardiac catheterization  2014    LHC (1/14): total occlusion of distal LAD, diffuse disease in ramus, 70% proximal LCx, 50%  proximal and mid RCA, 50% distal RCA, patent RIMA-RCA.   Family History  Problem Relation Age of Onset  . Cancer Mother   . Anemia Mother   . Kidney disease Mother   . Diabetes Father   . Heart disease Father   . Heart attack Father   . Heart failure Father   . Hyperlipidemia Father   . Hypertension Father   . Sudden death Father   . Kidney failure Sister 5  . Kidney disease Sister   . Heart attack Sister   . Heart disease Sister   . Hypertension Sister   . Diabetes Sister   . Diabetes Brother   . Kidney disease Brother    History  Substance Use Topics  . Smoking status: Never Smoker   . Smokeless tobacco: Never Used     Comment: smoked some as a teenager (high school)  . Alcohol Use: No     Comment: 09/15/2012 "drank a little when I was young"    Review of Systems  Constitutional: Negative for fever and chills.  Respiratory: Positive for shortness of breath.   Cardiovascular: Positive for chest pain.  Gastrointestinal: Negative for vomiting and abdominal pain.  Genitourinary: Negative for dysuria.  Musculoskeletal: Negative for back pain and neck pain.  Skin: Negative for rash.  Neurological: Negative for headaches.  All other systems reviewed and are negative.      Allergies  Ciprocin-fluocin-procin; Clarithromycin; and Glimepiride  Home Medications  No current outpatient prescriptions on file. BP 155/79  Pulse 84  Temp(Src) 97.6 F (36.4 C) (Oral)  Resp 18  Ht 5\' 5"  (1.651 m)  Wt 238 lb 1.6 oz (108 kg)  BMI 39.62 kg/m2  SpO2 95% Physical Exam  Constitutional: He is oriented to person, place, and time. He appears well-developed and well-nourished.  HENT:  Head: Normocephalic and atraumatic.  Eyes: EOM are normal. Pupils are equal, round, and reactive to light.  Neck: Neck supple.  Cardiovascular: Normal rate, regular rhythm and intact distal pulses.   Pulmonary/Chest: Effort normal and breath sounds normal. No respiratory distress. He exhibits no  tenderness.  Abdominal: There is no tenderness.  Musculoskeletal: Normal range of motion. He exhibits no edema.  Neurological: He is alert and oriented to person, place, and time.  Skin: Skin is warm and dry.    ED Course  Procedures (including critical care time) Labs Review Labs Reviewed  BASIC METABOLIC PANEL - Abnormal; Notable for the following:    Glucose, Bld 389 (*)    BUN 25 (*)    Creatinine, Ser 1.93 (*)    GFR calc non Af Amer 38 (*)    GFR calc Af Amer 44 (*)    All other components within normal limits  PRO B NATRIURETIC PEPTIDE - Abnormal; Notable for the following:    Pro B Natriuretic peptide (BNP) 172.2 (*)    All other components within normal limits  CBC  HEPARIN LEVEL (UNFRACTIONATED)  PROTIME-INR  TROPONIN I  TROPONIN I  TROPONIN I  PROTIME-INR  APTT  TSH  HEMOGLOBIN A1C  LIPID PANEL  BASIC METABOLIC PANEL  CBC  I-STAT TROPOININ, ED   Imaging Review Dg Chest 2 View  11/26/2013   CLINICAL DATA:  Chest and left arm pain.  EXAM: CHEST  2 VIEW  COMPARISON:  PA and lateral chest 07/05/2013.  FINDINGS: Lungs are clear. Heart size is normal. No pneumothorax or pleural effusion. The patient is status post median sternotomy.  IMPRESSION: No acute disease.   Electronically Signed   By: Inge Rise M.D.   On: 11/26/2013 23:38     Date: 11/27/2013  Rate: 82  Rhythm: normal sinus rhythm  QRS Axis: left  Intervals: normal  ST/T Wave abnormalities: nonspecific ST changes  Conduction Disutrbances:none  Narrative Interpretation: Normal sinus rhythm with old anterior and inferior Q waves  Old EKG Reviewed: unchanged  CRITICAL CARE Performed by: Teressa Lower Total critical care time: 30 Critical care time was exclusive of separately billable procedures and treating other patients. Critical care was necessary to treat or prevent imminent or life-threatening deterioration. Critical care was time spent personally by me on the following activities:  development of treatment plan with patient and/or surrogate as well as nursing, discussions with consultants, evaluation of patient's response to treatment, examination of patient, obtaining history from patient or surrogate, ordering and performing treatments and interventions, ordering and review of laboratory studies, ordering and review of radiographic studies, pulse oximetry and re-evaluation of patient's condition.  Aspirin. Nitroglycerin. IV morphine. On recheck still having 1/10 chest pain and morphine was repeated which resolved his pain. Discussed with cardiology, plan admit to cardiology service. IV heparin initiated  MDM   Diagnosis: Unstable angina  History of coronary disease, presenting with symptoms of unstable angina, chest pain worse with exertion and improved with rest - more severe symptoms  tonight and remaining symptomatic at rest.  Evaluated with EKG, chest x-ray labs reviewed as above. Serial evaluations. Pain improved with medications provided including IV narcotics. IV heparin initiated admitted to cardiology service    Teressa Lower, MD 11/27/13 [phone]

## 2013-11-27 NOTE — Progress Notes (Signed)
Very pleasant 53 year old man who has a difficult medical history including an inferior STEMI in 1999. 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  He had heart catheterization in January 2014 which showed 40-50% left main, with a roughly 30% ISR and LAD stent. He admits stents are patent and the diagonal was 100% occluded. There is distal LAD occlusion.  There is also 70% left circumflex. The vein was patent. He was actually seen by Dr. [name] on March 3 for worsening angina. At that time he had been off his ranolazine. He also noted to have significant weight gain. He presented early this morning with 2 episodes of exertional chest pressure consistent with his angina that lasted for a prolonged period of time. He notes that his second episode was relieved by nitroglycerin in the emergency room.Marland Kitchen His initial cardiac enzymes are negative.  Subjective:  He feels tired, otherwise no active angina. He did not have any resting angina.  Objective:  Vital Signs in the last 24 hours: Temp:  [97.6 F (36.4 C)-98.8 F (37.1 C)] 97.6 F (36.4 C) (03/20 0314) Pulse Rate:  [72-85] 84 (03/20 0314) Resp:  [13-26] 18 (03/20 0314) BP: (133-196)/(62-95) 155/79 mmHg (03/20 0314) SpO2:  [93 %-100 %] 95 % (03/20 0314) Weight:  [238 lb 1.6 oz (108 kg)-255 lb (115.667 kg)] 238 lb 1.6 oz (108 kg) (03/20 0539)  Intake/Output from previous day: 03/19 0701 - 03/20 0700 In: -  Out: 340 [Urine:340] Intake/Output from this shift:    Medications Reviewed in Epic  Physical Exam: General appearance: alert, cooperative, appears stated age, no distress and moderately obese Neck: no adenopathy, no carotid bruit, no JVD and supple, symmetrical, trachea midline Lungs: clear to auscultation bilaterally, normal percussion bilaterally and Nonlabored Heart: RRR. Normal S1-S2. No M./R./G. Abdomen: soft, non-tender; bowel sounds normal; no masses,  no organomegaly and  Obese Extremities: extremities normal, atraumatic, no cyanosis or edema Pulses: 2+ and symmetric Neurologic: Alert and oriented X 3, normal strength and tone. Normal symmetric reflexes. Normal coordination and gait  Lab Results:  Recent Labs  11/26/13 2350 11/27/13 0543  WBC 7.6 7.3  HGB 14.0 13.3  PLT 224 220    Recent Labs  11/26/13 2350 11/27/13 0543  NA 137 141  K 4.5 4.5  CL 100 104  CO2 21 22  GLUCOSE 389* 243*  BUN 25* 24*  CREATININE 1.93* 1.85*    Recent Labs  11/27/13 0543  TROPONINI <0.30   Hepatic Function Panel No results found for this basename: PROT, ALBUMIN, AST, ALT, ALKPHOS, BILITOT, BILIDIR, IBILI,  in the last 72 hours  Recent Labs  11/27/13 0543  CHOL 190   No results found for this basename: PROTIME,  in the last 72 hours  Imaging: Chest x-ray shows no acute disease  Cardiac Studies: NSR, rate 82. Old inferior and anterior infarct with Q waves.  Assessment/Plan:  Principal Problem:   Chest pain Active Problems:   Dyslipidemia   Type 2 diabetes mellitus   HTN (hypertension)   CKD (chronic kidney disease), stage III   Angina pectoris   Unstable angina  Currently chest pain-free. I am concerned with his increasing angina and frequency. I discussed in a telephone with Dr. Aundra Dubin. Both remain concerned about his renal insufficiency. He has chronic angina that is relatively well controlled with current regimen but has been worse now with weight gain. Not overly convinced that this is actually anginal  pain versus possible GI related. His symptoms are exertional however.  Plan for now will be to rule out cardiac markers. If he rules out will order a LexiScan Myoview in the morning in order to avoid holding Coreg. --> Ischemic Myoview changes would potentially guide cardiac catheterization options.    Would also be beneficial to pre-hydration for catheterization if that is the intent.  For now continue heparin drip until enzymes proved  negative.  Analysis continue his anti-angina regimen of amlodipine, Coreg, Imdur Ranexa. Along with aspirin.  He is also on statin and insulin. We'll continue to monitor his glycemic control. He is only on his Lantus, we'll monitor to see if he requires additional therapy.    LOS: 1 day    Siddh Vandeventer W 11/27/2013, 9:42 AM

## 2013-11-28 ENCOUNTER — Observation Stay (HOSPITAL_COMMUNITY): Payer: Medicare PPO

## 2013-11-28 DIAGNOSIS — R079 Chest pain, unspecified: Secondary | ICD-10-CM

## 2013-11-28 LAB — LIPASE, BLOOD: Lipase: 53 U/L (ref 11–59)

## 2013-11-28 LAB — HEPATIC FUNCTION PANEL
ALK PHOS: 84 U/L (ref 39–117)
ALT: 14 U/L (ref 0–53)
AST: 14 U/L (ref 0–37)
Albumin: 3.8 g/dL (ref 3.5–5.2)
BILIRUBIN TOTAL: 0.3 mg/dL (ref 0.3–1.2)
Bilirubin, Direct: <0.2 mg/dL (ref 0.0–0.3)
TOTAL PROTEIN: 7.5 g/dL (ref 6.0–8.3)

## 2013-11-28 LAB — CBC
HEMATOCRIT: 36.4 % — AB (ref 39.0–52.0)
HEMOGLOBIN: 12.5 g/dL — AB (ref 13.0–17.0)
MCH: 28.7 pg (ref 26.0–34.0)
MCHC: 34.3 g/dL (ref 30.0–36.0)
MCV: 83.7 fL (ref 78.0–100.0)
Platelets: 208 10*3/uL (ref 150–400)
RBC: 4.35 MIL/uL (ref 4.22–5.81)
RDW: 13 % (ref 11.5–15.5)
WBC: 8 10*3/uL (ref 4.0–10.5)

## 2013-11-28 LAB — GLUCOSE, CAPILLARY
GLUCOSE-CAPILLARY: 371 mg/dL — AB (ref 70–99)
Glucose-Capillary: 162 mg/dL — ABNORMAL HIGH (ref 70–99)
Glucose-Capillary: 223 mg/dL — ABNORMAL HIGH (ref 70–99)
Glucose-Capillary: 165 mg/dL — ABNORMAL HIGH (ref 70–99)

## 2013-11-28 LAB — HEPARIN LEVEL (UNFRACTIONATED): HEPARIN UNFRACTIONATED: 0.7 [IU]/mL (ref 0.30–0.70)

## 2013-11-28 MED ORDER — REGADENOSON 0.4 MG/5ML IV SOLN
0.4000 mg | Freq: Once | INTRAVENOUS | Status: AC
  Administered 2013-11-28: 0.4 mg via INTRAVENOUS
  Filled 2013-11-28: qty 5

## 2013-11-28 MED ORDER — TECHNETIUM TC 99M SESTAMIBI GENERIC - CARDIOLITE
10.0000 | Freq: Once | INTRAVENOUS | Status: AC | PRN
  Administered 2013-11-28: 10 via INTRAVENOUS

## 2013-11-28 MED ORDER — REGADENOSON 0.4 MG/5ML IV SOLN
INTRAVENOUS | Status: AC
  Administered 2013-11-28: 0.4 mg via INTRAVENOUS
  Filled 2013-11-28: qty 5

## 2013-11-28 MED ORDER — TECHNETIUM TC 99M SESTAMIBI - CARDIOLITE
30.0000 | Freq: Once | INTRAVENOUS | Status: AC | PRN
  Administered 2013-11-28: 30 via INTRAVENOUS

## 2013-11-28 NOTE — Progress Notes (Signed)
Ambulated in hallway with patient on room air. Patient ambulated approx. 500 feet. O2 SATS 98-99% on room air. Patient tolerated walk well. Matthew Rowe R

## 2013-11-28 NOTE — Progress Notes (Signed)
Patient: Matthew NEYENS Sr. / Admit Date: 11/26/2013 / Date of Encounter: 11/28/2013, 10:22 AM  Subjective  Felt OK this AM. Had transient CP, nausea, back discomfort during Nelson without acute EKG changes.  Objective   Telemetry: Normal sinus rhythm  Physical Exam: Blood pressure 147/86, pulse 79, temperature 98.1 F (36.7 C), temperature source Oral, resp. rate 18, height 5\' 5"  (1.651 m), weight 239 lb 13.8 oz (108.8 kg), SpO2 100.00%. General: Well developed, well nourished overweight AAM in no acute distress. Head: Normocephalic, atraumatic, sclera non-icteric, no xanthomas, nares are without discharge. Neck:  JVP not elevated. Lungs: Clear bilaterally to auscultation without wheezes, rales, or rhonchi. Breathing is unlabored. Heart: RRR S1 S2 without murmurs, rubs, or gallops.  Abdomen: Soft, non-tender, non-distended with normoactive bowel sounds. No rebound/guarding. Extremities: No clubbing or cyanosis. No edema. Distal pedal pulses are 2+ and equal bilaterally. Neuro: Alert and oriented X 3. Moves all extremities spontaneously. Psych:  Responds to questions appropriately with a normal affect.   Intake/Output Summary (Last 24 hours) at 11/28/13 1022 Last data filed at 11/28/13 0604  Gross per 24 hour  Intake   1440 ml  Output   3250 ml  Net  -1810 ml    Inpatient Medications:  . amLODipine  10 mg Oral Daily  . aspirin  325 mg Oral Daily  . carvedilol  37.5 mg Oral BID WC  . gabapentin  300 mg Oral TID  . insulin aspart  0-20 Units Subcutaneous TID WC  . insulin glargine  20 Units Subcutaneous BID  . isosorbide mononitrate  60 mg Oral BID WC  . losartan  100 mg Oral Daily  . pantoprazole  40 mg Oral BID WC  . ranolazine  1,000 mg Oral BID  . regadenoson      . regadenoson  0.4 mg Intravenous Once  . simvastatin  20 mg Oral q1800  . vitamin E  1,000 Units Oral BID   Infusions:  . heparin 1,350 Units/hr (11/28/13 0314)    Labs:  Recent Labs   11/26/13 2350 11/27/13 0543  NA 137 141  K 4.5 4.5  CL 100 104  CO2 21 22  GLUCOSE 389* 243*  BUN 25* 24*  CREATININE 1.93* 1.85*  CALCIUM 9.5 9.3   No results found for this basename: AST, ALT, ALKPHOS, BILITOT, PROT, ALBUMIN,  in the last 72 hours  Recent Labs  11/27/13 0543 11/28/13 0705  WBC 7.3 8.0  HGB 13.3 12.5*  HCT 37.9* 36.4*  MCV 84.2 83.7  PLT 220 208    Recent Labs  11/27/13 0543 11/27/13 0930 11/27/13 1548  TROPONINI <0.30 <0.30 <0.30   No components found with this basename: POCBNP,   Recent Labs  11/27/13 0543  HGBA1C 10.2*     Radiology/Studies:  Dg Chest 2 View  11/26/2013   CLINICAL DATA:  Chest and left arm pain.  EXAM: CHEST  2 VIEW  COMPARISON:  PA and lateral chest 07/05/2013.  FINDINGS: Lungs are clear. Heart size is normal. No pneumothorax or pleural effusion. The patient is status post median sternotomy.  IMPRESSION: No acute disease.   Electronically Signed   By: Inge Rise M.D.   On: 11/26/2013 23:38     Assessment and Plan  1. Chest pain, for nuc today 2. CAD s/p prior CABG, PCI 3. DM 4. CKD stage III 5. DM uncontrollde A1C 10.2 6. Hypertriglyceridemia 7. Abnl TSH 6.2 8. Gradual weight gain since 05/2013 (225 -> 239), PBNP only 172 and  no CHF on CXR 9. Hypoxia - per notes 3/20 -7am O2 sat was in 80s but further documentations of 90's on RA 10. OSA on CPAP qhs  Pt seen briefly in nuc. Await nuc results. MD to follow and comment on further eval of hypoxia. Not on home O2 but uses CPAP qhs. 6 hr car ride to [place] 2 weeks ago with breaks but sx predate this (began brewing about a month ago). Will need to f/u PCP for uncontrolled DM, TSH, trigs.    Signed, Melina Copa PA-C  Patient seen on the floor. Lungs clear, no S3 and feels well and wants to eat.  Oxygen saturations have been fine except for one that was low on 3/20. Don't think any further workup needs to be done of that.  Kerry Hough MD [facility]

## 2013-11-28 NOTE — Progress Notes (Signed)
ANTICOAGULATION CONSULT NOTE - Follow Up Consult  Pharmacy Consult for Heparin Indication: chest pain/ACS  Allergies  Allergen Reactions  . Ciprocin-Fluocin-Procin [Fluocinolone Acetonide] Rash  . Clarithromycin Itching and Other (See Comments)    "Biaxin" Eyes itch and burn  . Glimepiride [Amaryl] Other (See Comments)    Elevates liver function     Patient Measurements: Height: 5\' 5"  (165.1 cm) Weight: 239 lb 13.8 oz (108.8 kg) IBW/kg (Calculated) : 61.5 Heparin Dosing Weight:   Vital Signs: Temp: 98.1 F (36.7 C) (03/21 0557) Temp src: Oral (03/21 0557) BP: 137/84 mmHg (03/21 1229) Pulse Rate: 79 (03/21 0557)  Labs:  Recent Labs  11/26/13 2350 11/27/13 0543 11/27/13 0930 11/27/13 0950 11/27/13 1548 11/27/13 1958 11/28/13 0705  HGB 14.0 13.3  --   --   --   --  12.5*  HCT 40.1 37.9*  --   --   --   --  36.4*  PLT 224 220  --   --   --   --  208  APTT  --  57*  --   --   --   --   --   LABPROT  --  11.2*  --  12.7  --   --   --   INR  --  0.82  --  0.97  --   --   --   HEPARINUNFRC  --   --   --  0.23*  --  0.55 0.70  CREATININE 1.93* 1.85*  --   --   --   --   --   TROPONINI  --  <0.30 <0.30  --  <0.30  --   --     Estimated Creatinine Clearance: 52.5 ml/min (by C-G formula based on Cr of 1.85).   Medications:  Scheduled:  . amLODipine  10 mg Oral Daily  . aspirin  325 mg Oral Daily  . carvedilol  37.5 mg Oral BID WC  . gabapentin  300 mg Oral TID  . insulin aspart  0-20 Units Subcutaneous TID WC  . insulin glargine  20 Units Subcutaneous BID  . isosorbide mononitrate  60 mg Oral BID WC  . losartan  100 mg Oral Daily  . pantoprazole  40 mg Oral BID WC  . ranolazine  1,000 mg Oral BID  . simvastatin  20 mg Oral q1800  . vitamin E  1,000 Units Oral BID    Assessment: 53yo on heparin for ACS, s/p Lexiscan this AM & awaiting results.  Heparin level therapeutic but at upper limit.  Hg 12.5 and pltc wnl.  No bleeding noted.  Goal of Therapy:   Heparin level 0.3-0.7 units/ml Monitor platelets by anticoagulation protocol: Yes   Plan:  1-  Decrease heparin to 1250 units/hr 2-  F/U in AM  Gracy Bruins, PharmD [facility]

## 2013-11-28 NOTE — Progress Notes (Addendum)
Conveyed nuc results to pt - Dr. Wynonia Lawman plans to reassess in AM to determine next course of action. Since trigs are so high have also tacked on lipase level since there was also concern for GI etiology this admission. Dayna Dunn PA-C

## 2013-11-29 ENCOUNTER — Encounter (HOSPITAL_COMMUNITY): Payer: Self-pay | Admitting: Physician Assistant

## 2013-11-29 LAB — CBC
HCT: 36.1 % — ABNORMAL LOW (ref 39.0–52.0)
Hemoglobin: 12.6 g/dL — ABNORMAL LOW (ref 13.0–17.0)
MCH: 29.4 pg (ref 26.0–34.0)
MCHC: 34.9 g/dL (ref 30.0–36.0)
MCV: 84.1 fL (ref 78.0–100.0)
Platelets: 222 10*3/uL (ref 150–400)
RBC: 4.29 MIL/uL (ref 4.22–5.81)
RDW: 13 % (ref 11.5–15.5)
WBC: 7.9 10*3/uL (ref 4.0–10.5)

## 2013-11-29 LAB — BASIC METABOLIC PANEL
BUN: 26 mg/dL — ABNORMAL HIGH (ref 6–23)
CALCIUM: 9.7 mg/dL (ref 8.4–10.5)
CO2: 21 mEq/L (ref 19–32)
Chloride: 102 mEq/L (ref 96–112)
Creatinine, Ser: 2 mg/dL — ABNORMAL HIGH (ref 0.50–1.35)
GFR calc non Af Amer: 36 mL/min — ABNORMAL LOW (ref >90)
GFR, EST AFRICAN AMERICAN: 42 mL/min — AB (ref >90)
Glucose, Bld: 156 mg/dL — ABNORMAL HIGH (ref 70–99)
Potassium: 4.7 mEq/L (ref 3.7–5.3)
Sodium: 137 mEq/L (ref 137–147)

## 2013-11-29 LAB — HEPARIN LEVEL (UNFRACTIONATED): HEPARIN UNFRACTIONATED: 0.65 [IU]/mL (ref 0.30–0.70)

## 2013-11-29 LAB — GLUCOSE, CAPILLARY
Glucose-Capillary: 146 mg/dL — ABNORMAL HIGH (ref 70–99)
Glucose-Capillary: 206 mg/dL — ABNORMAL HIGH (ref 70–99)

## 2013-11-29 NOTE — Discharge Summary (Signed)
Agree 

## 2013-11-29 NOTE — Discharge Summary (Signed)
Discharge Summary   Patient ID: Matthew LUSKEY Sr. MRN: [id number], DOB/AGE: June 04, 1961 53 y.o. Admit date: 11/26/2013 D/C date:     11/29/2013  Primary Care Provider: Lamar Blinks, MD Primary Cardiologist: Aundra Dubin  Primary Discharge Diagnoses:  1. Chest pain, possible angina - not taking antianginal regimen (Ranexa) consistently PTA - abnormal stress test 11/28/13 (see below), for continued medical therapy 2. CAD - history: INF MI 1999: PCI of RCA; h/o extensive stenting of LAD; s/p CABG with RIMA->RCA in 2006; stable, Low risk MV 05/2012; 09/2012 Cath: LM 40-50d, LAD 30 isr, patent mid stents, 100d, LCX 70p, RI diff dzs, RCA (anomalous take off) 50p, patent RIMA->med rx 3. CKD stage III (polycystic kidney disease) 4. Dyslipidemia with hypertriglyceridemia 5. HTN 6. Diabetes mellitus type II, insulin dependent  Secondary Discharge Diagnoses:  1. GERD 2. OSA on CPAP  Hospital Course: Matthew Rowe is a 53 y/o man with PMH of CKD stage III due to polycystic kidney disease, CAD (inferior MI '99, CABG with RIMA to RCA '06 related to anomalous RCA off origin of left, last LHC 1/14 for medical therapy), HTN, T2DM, GERD, OSA on CPAP who presented to [facility]/26/2013 with CP. His last cath in 09/2012 showed LM 40-50%, LAD 30% instent restenosis, patent mid stents, 100% diagonal, 70% prox LCx, diffuse disease RI, anomalous take off of RCA, patent RIMA. He saw Dr. Aundra Dubin on 11/11/13, where he was noted to have more angina than usual being treated with ranolazine 1000 mg BID, Coreg, Amlodipine and Imdur. He's had a gradual 20 lb weight gain. He was reporting chest pain/pressure particularly worse with walking and persisting until several minutes of rest. The pain lasted 30 minutes after walking on day of admission prompting presentation. His pain finally relieved in the ER after SL NTG and heparin gtt initiated. He was admitted for further evaluation. Home meds were cotinued. Dr. Ellyn Hack  discussed the case with Dr. Aundra Dubin who both remain concerned about pursuing repeat cath in light of his renal insufficiency. Troponins remained negative. Lipase was negative along with LFTs. pBNP was 172 and CXR was clear. There was concern on one vital check that the patient's O2 sat dropped into the 80s, however, this was felt to be an error because when rechecked and ambulated on RA he remained 98-99% and tolerated the walk well. He was not tachycardic and had no signs of CHF or DVT on exam. He underwent Lexiscan nuclear stress testing that showed: "IMPRESSION: Non reversible decreased myocardial perfusion at the left ventricular apex. Large perfusion defect involving the inferolateral wall the left ventricle with partial reversibility particularly at the lateral margin of the defect on resting images. When compared to the previous exam the inferior wall defect extends further into the posterior wall and lateral wall than on the previous study. Normal left ventricular ejection fraction of 54%, slightly increased from the 46% on the previous exam." Dr. Wynonia Lawman kept him yesterday night for observation. This morning the patient mentioned that he had not been fully compliant with his Ranexa and had not been taking it consistently. The importance of medication compliance was stressed. Medical therapy may be sufficient in preventing angina, if he commits to doing a better job with compliance. Heparin was stopped. The patient ambulated today multiple times the whole unit of 2W without chest pain or symptoms. Dr. Wynonia Lawman would like to continue home meds and have him follow up closely in the office this week. Dr. Wynonia Lawman I have left a message on  our office's scheduling voicemail requesting this follow-up appointment, and our office will call the patient with this appointment. He was also encouraged to continue nephrology followup. Dr. Claris Gladden note mentions that Scr running closer to the 2 range lately, which it was on  day of discharge. He was advised to d/c ibuprofen due to kidney disease (on home med list, not on this in-house). Will defer further mgmt of triglycerides to Dr. Aundra Dubin.   Discharge Vitals: Blood pressure 132/65, pulse 75, temperature 97.6 F (36.4 C), temperature source Oral, resp. rate 18, height 5\' 5"  (1.651 m), weight 238 lb 5.1 oz (108.1 kg), SpO2 97.00%.  Labs: Lab Results  Component Value Date   WBC 7.9 11/29/2013   HGB 12.6* 11/29/2013   HCT 36.1* 11/29/2013   MCV 84.1 11/29/2013   PLT 222 11/29/2013     Recent Labs Lab 11/28/13 1630 11/29/13 0516  NA  --  137  K  --  4.7  CL  --  102  CO2  --  21  BUN  --  26*  CREATININE  --  2.00*  CALCIUM  --  9.7  PROT 7.5  --   BILITOT 0.3  --   ALKPHOS 84  --   ALT 14  --   AST 14  --   GLUCOSE  --  156*    Recent Labs  11/27/13 0543 11/27/13 0930 11/27/13 1548  TROPONINI <0.30 <0.30 <0.30   Lab Results  Component Value Date   CHOL 190 11/27/2013   HDL 32* 11/27/2013   LDLCALC UNABLE TO CALCULATE IF TRIGLYCERIDE OVER 400 mg/dL 11/27/2013   TRIG 605* 11/27/2013    Diagnostic Studies/Procedures   Dg Chest 2 View 11/26/2013   CLINICAL DATA:  Chest and left arm pain.  EXAM: CHEST  2 VIEW  COMPARISON:  PA and lateral chest 07/05/2013.  FINDINGS: Lungs are clear. Heart size is normal. No pneumothorax or pleural effusion. The patient is status post median sternotomy.  IMPRESSION: No acute disease.   Electronically Signed   By: Inge Rise M.D.   On: 11/26/2013 23:38   Nm Myocar Multi W/spect W/wall Motion / Ef 11/28/2013   CLINICAL DATA:  Chest pain, history coronary artery disease post CABG, diabetes, hypertension  EXAM: MYOCARDIAL IMAGING WITH SPECT (REST AND PHARMACOLOGIC-STRESS)  GATED LEFT VENTRICULAR WALL MOTION STUDY  LEFT VENTRICULAR EJECTION FRACTION  TECHNIQUE: Standard myocardial SPECT imaging was performed after resting intravenous injection of 10 mCi Tc-28m sestamibi. Subsequently, intravenous infusion of Lexiscan  was performed under the supervision of the Cardiology staff. At peak effect of the drug, 30 mCi Tc-59m sestamibi was injected intravenously and standard myocardial SPECT imaging was performed. Quantitative gated imaging was also performed to evaluate left ventricular wall motion, and estimate left ventricular ejection fraction.  COMPARISON:  07/28/2010  FINDINGS: Mild prior perfusion SPECT images obtained following pharmacologic stress reveals a perfusion defect at the left ventricular apex.  In addition, a large area of these decreased perfusion is seen in the inferior wall the left ventricle extending into the inferolateral wall. Resting exam demonstrates no change in the left apex defect.  Resting images also demonstrate partial reperfusion of the margins of the inferolateral defect particularly the lateral margin.  No pulmonary active tracer.  Normal left ventricular ejection fraction of 54% is calculated on gated SPECT images following pharmacologic stress.  This is derive from an end-diastolic volume calculation of 144 mL and end systolic volume of 66 mL.  Mild hypokinesia of the inferior  wall is noted.  IMPRESSION: Non reversible decreased myocardial perfusion at the left ventricular apex.  Large perfusion defect involving the inferolateral wall the left ventricle with partial reversibility particularly at the lateral margin of the defect on resting images.  When compared to the previous exam the inferior wall defect extends further into the posterior wall and lateral wall than on the previous study.  Normal left ventricular ejection fraction of 54%, slightly increased from the 46% on the previous exam.   Electronically Signed   By: Lavonia Dana M.D.   On: 11/28/2013 13:14    Discharge Medications   Current Discharge Medication List    CONTINUE these medications which have NOT CHANGED   Details  Alcohol Swabs (B-D SINGLE USE SWABS REGULAR) PADS USE TO CHECK BLOOD SUGAR THREE TIMES DAILY       amLODipine (NORVASC) 10 MG tablet Take 1 tablet (10 mg total) by mouth daily.    Associated Diagnoses: HTN (hypertension)    aspirin 325 MG tablet Take 325 mg by mouth daily.     Blood Glucose Monitoring Suppl DEVI Use to test blood sugar 3 times daily. Dx code 250.00.     carvedilol (COREG) 25 MG tablet TAKE 1 AND 1/2 TABLETS TWICE DAILY WITH MEALS    Associated Diagnoses: HTN (hypertension)    gabapentin (NEURONTIN) 300 MG capsule Take 1 capsule (300 mg total) by mouth 3 (three) times daily.    Associated Diagnoses: Angina pectoris    Insulin Glargine (LANTUS SOLOSTAR) 100 UNIT/ML Solostar Pen Inject 20-30 Units into the skin 2 (two) times daily. Usually takes 20 units, if blood sugars are elevated can take up to 30 units     Insulin Pen Needle 31G X 5 MM MISC Use one daily for insulin injection     isosorbide mononitrate (IMDUR) 60 MG 24 hr tablet Take 1 tablet (60 mg total) by mouth 2 (two) times daily.    Associated Diagnoses: Angina pectoris    losartan (COZAAR) 100 MG tablet Take 1 tablet (100 mg total) by mouth daily.    Associated Diagnoses: Coronary artery disease    nitroGLYCERIN (NITROSTAT) 0.4 MG SL tablet Place 1 tablet (0.4 mg total) under the tongue every 5 (five) minutes as needed.    Associated Diagnoses: Angina pectoris    pantoprazole (PROTONIX) 40 MG tablet Take 1 tablet (40 mg total) by mouth 2 (two) times daily with a meal.    Associated Diagnoses: GERD (gastroesophageal reflux disease)    pravastatin (PRAVACHOL) 40 MG tablet Take 1 tablet (40 mg total) by mouth every evening.    Associated Diagnoses: Dyslipidemia    PRODIGY NO CODING BLOOD GLUC test strip USE TO CHECK BLOOD SUGAR THREE TIMES DAILY      PRODIGY TWIST TOP LANCETS 28G MISC USE TO CHECK BLOOD SUGAR THREE TIMES DAILY      ranolazine (RANEXA) 1000 MG SR tablet Take 1 tablet (1,000 mg total) by mouth 2 (two) times daily.    Associated Diagnoses: Coronary artery disease     vitamin E 1000 UNIT capsule Take 1,000 Units by mouth 2 (two) times daily.      zolpidem (AMBIEN) 10 MG tablet Take 10 mg by mouth at bedtime as needed. For insomnia    acetaminophen (TYLENOL) 325 MG tablet Take 975 mg by mouth every 6 (six) hours as needed for pain.      STOP taking these medications     ibuprofen (ADVIL,MOTRIN) 200 MG tablet  Disposition   The patient will be discharged in stable condition to home. Discharge Orders   Future Orders Complete By Expires   Diet - low sodium heart healthy  As directed    Discharge instructions  As directed    Comments:     It is very important to take your medications as prescribed without missing doses.  However, please stop ibuprofen. Patients with kidney function should NOT take medicines like ibuprofen, Advil, Motrin, naproxen, and Aleve due to risk of worsened kidney function. You may take Tylenol as directed or talk to your primary doctor about alternatives.   Increase activity slowly  As directed      Follow-up Information   Follow up with [name], MD. (Our office will call you for a follow-up appointment. Please call the office if you have not heard from [place] within 3 days.)    Specialty:  Cardiology   Contact information:   [address] [place] [address] [phone]       Follow up with Kidney Doctor. (Please make an appointment with your kidney doctor. Your kidney function has been running closer to the 2 range recently.)         Duration of Discharge Encounter: Greater than 30 minutes including physician and PA time.  Signed, Rosabell Geyer PA-C 11/29/2013, 1:51 PM

## 2013-11-29 NOTE — Progress Notes (Signed)
Utilization review completed.  

## 2013-11-29 NOTE — Progress Notes (Addendum)
Nursing note patient ambulated in hallway, 600 feet, without complaints, no chest pain or shortness of breath. Will continue to monitor patient. Brently Voorhis, Bettina Gavia RN

## 2013-11-29 NOTE — Progress Notes (Signed)
Subjective: Currently CP free. Denies SOB. No abdominal pain.   Objective: Vital signs in last 24 hours: Temp:  [97.4 F (36.3 C)-97.6 F (36.4 C)] 97.6 F (36.4 C) (03/22 0500) Pulse Rate:  [75-77] 75 (03/22 0915) Resp:  [18] 18 (03/22 0500) BP: (132-150)/(65-94) 132/65 mmHg (03/22 0915) SpO2:  [97 %-100 %] 97 % (03/22 0500) Weight:  [238 lb 5.1 oz (108.1 kg)] 238 lb 5.1 oz (108.1 kg) (03/22 0500) Last BM Date: 11/28/13  Intake/Output from previous day: 03/21 0701 - 03/22 0700 In: 2057.5 [P.O.:1970; I.V.:87.5] Out: 4675 [Urine:4675] Intake/Output this shift: Total I/O In: 120 [P.O.:120] Out: 500 [Urine:500]  Medications Current Facility-Administered Medications  Medication Dose Route Frequency Provider Last Rate Last Dose  . acetaminophen (TYLENOL) tablet 975 mg  975 mg Oral Q6H PRN Jules Husbands, MD   975 mg at 11/29/13 0126  . amLODipine (NORVASC) tablet 10 mg  10 mg Oral Daily Jules Husbands, MD   10 mg at 11/29/13 0950  . aspirin tablet 325 mg  325 mg Oral Daily Jules Husbands, MD   325 mg at 11/29/13 0950  . carvedilol (COREG) tablet 37.5 mg  37.5 mg Oral BID WC Jules Husbands, MD   37.5 mg at 11/29/13 0913  . gabapentin (NEURONTIN) capsule 300 mg  300 mg Oral TID Jules Husbands, MD   300 mg at 11/29/13 0950  . heparin ADULT infusion 100 units/mL (25000 units/250 mL)  1,250 Units/hr Intravenous Continuous Kendra P Hiatt, RPH 12.5 mL/hr at 11/29/13 0800 1,250 Units/hr at 11/29/13 0800  . insulin aspart (novoLOG) injection 0-20 Units  0-20 Units Subcutaneous TID WC Evelene Croon Barrett, PA-C   3 Units at 11/29/13 0705  . insulin glargine (LANTUS) injection 20 Units  20 Units Subcutaneous BID Jules Husbands, MD   20 Units at 11/29/13 [phone]  . isosorbide mononitrate (IMDUR) 24 hr tablet 60 mg  60 mg Oral BID WC Jules Husbands, MD   60 mg at 11/29/13 0913  . losartan (COZAAR) tablet 100 mg  100 mg Oral Daily Jules Husbands, MD   100 mg at 11/29/13 0950  . nitroGLYCERIN (NITROSTAT) SL tablet 0.4 mg   0.4 mg Sublingual Q5 min PRN Teressa Lower, MD      . nitroGLYCERIN (NITROSTAT) SL tablet 0.4 mg  0.4 mg Sublingual Q5 min PRN Jules Husbands, MD      . pantoprazole (PROTONIX) EC tablet 40 mg  40 mg Oral BID WC Jules Husbands, MD   40 mg at 11/29/13 0704  . ranolazine (RANEXA) 12 hr tablet 1,000 mg  1,000 mg Oral BID Jules Husbands, MD   1,000 mg at 11/29/13 0950  . simvastatin (ZOCOR) tablet 20 mg  20 mg Oral q1800 Jules Husbands, MD   20 mg at 11/28/13 1637  . vitamin E capsule 1,000 Units  1,000 Units Oral BID Jules Husbands, MD   1,000 Units at 11/29/13 0950  . zolpidem (AMBIEN) tablet 5 mg  5 mg Oral QHS PRN Jules Husbands, MD   5 mg at 11/29/13 0034    PE: General appearance: alert, cooperative, no distress and moderately obese Lungs: clear to auscultation bilaterally Heart: regular rate and rhythm Extremities: no LEE Pulses: 2+ and symmetric Skin: warm and dry' Neurologic: Grossly normal  Lab Results:   Recent Labs  11/27/13 0543 11/28/13 0705 11/29/13 0516  WBC 7.3 8.0 7.9  HGB 13.3 12.5* 12.6*  HCT 37.9* 36.4* 36.1*  PLT 220 208 222   BMET  Recent Labs  11/26/13 2350 11/27/13 0543 11/29/13 0516  NA 137 141 137  K 4.5 4.5 4.7  CL 100 104 102  CO2 21 22 21   GLUCOSE 389* 243* 156*  BUN 25* 24* 26*  CREATININE 1.93* 1.85* 2.00*  CALCIUM 9.5 9.3 9.7   PT/INR  Recent Labs  11/27/13 0543 11/27/13 0950  LABPROT 11.2* 12.7  INR 0.82 0.97   Cholesterol  Recent Labs  11/27/13 0543  CHOL 190   Cardiac Panel (last 3 results)  Recent Labs  11/27/13 0543 11/27/13 0930 11/27/13 1548  TROPONINI <0.30 <0.30 <0.30    Studies/Results:  NST 11/28/13 IMPRESSION: Non reversible decreased myocardial perfusion at the left ventricular apex.  Large perfusion defect involving the inferolateral wall the left ventricle with partial reversibility particularly at the lateral margin of the defect on resting images.  When compared to the previous exam the inferior wall defect  extends further into the posterior wall and lateral wall than on the previous study.  Normal left ventricular ejection fraction of 54%, slightly increased from the 46% on the previous exam.   Assessment/Plan  Principal Problem:   Chest pain Active Problems:   Dyslipidemia   Type 2 diabetes mellitus   HTN (hypertension)   CKD (chronic kidney disease), stage III   Angina pectoris   Unstable angina  Plan: He denies further CP, but has been on IV heparin. NST yesterday demonstrated a large perfusion defect involving the inferolateral wall, the left ventricle with partial reversibility particularly at the lateral margin of the defect on resting images. When compared to the previous exam the inferior wall defect extends further into the posterior wall and lateral wall than on the previous study. Normal left ventricular ejection fraction of 54%. ? If he should undergo a LHC on Monday. Will have MD review his stress test images to determine. If LHC, he will need agressive hydration with IVFs due to renal function. SCr is 2.00 today.  It also appears that he has not been fully compliant with his Ranexa. He has been prescibed 1000 mg BID, but has not been taking it consistently. The importance of medication compliance was stress. Medical therapy may be sufficient in preventing angina, if he commits to doing a better job with compliance.   There was also concern about a potential GI etiology, as Triglycerides were severely elevated at 605 on recent lipid pannel. A lipase was checked and is WNL at 53.   For now, continue ASA, BB, Nitrate, Ranexa, statin and IV heparin. MD to follow.     LOS: 3 days   Family And the patient would like to go home today. On reviewing the previous catheterization note,, he has a prior problem in the inferior wall with a prior inferior infarction also has occlusion of the distal LAD. I think this can explain his Myoview study and will stop heparin and let him walk  around. If he remains stable then may go home later on today with early follow up Dr. Marigene Ehlers next week. Feel at this point that the risk of catheterization with his renal function is not worth the benefit.

## 2013-11-29 NOTE — Progress Notes (Signed)
Nursing note Patient given AVS , discharge instructions and medication list. All questions answered will discharge home as ordered. Appolonia Ackert, Bettina Gavia RN

## 2013-12-01 ENCOUNTER — Encounter (HOSPITAL_COMMUNITY): Payer: Self-pay | Admitting: Emergency Medicine

## 2013-12-01 ENCOUNTER — Inpatient Hospital Stay (HOSPITAL_COMMUNITY)
Admission: EM | Admit: 2013-12-01 | Discharge: 2013-12-04 | DRG: 247 | Disposition: A | Discharge status: Home / Self Care | Payer: Medicare PPO | Attending: Cardiology | Admitting: Cardiology

## 2013-12-01 ENCOUNTER — Encounter: Payer: Commercial Managed Care - HMO | Admitting: Nurse Practitioner

## 2013-12-01 DIAGNOSIS — Z8249 Family history of ischemic heart disease and other diseases of the circulatory system: Secondary | ICD-10-CM

## 2013-12-01 DIAGNOSIS — I10 Essential (primary) hypertension: Secondary | ICD-10-CM

## 2013-12-01 DIAGNOSIS — E1169 Type 2 diabetes mellitus with other specified complication: Secondary | ICD-10-CM | POA: Diagnosis present

## 2013-12-01 DIAGNOSIS — I209 Angina pectoris, unspecified: Secondary | ICD-10-CM

## 2013-12-01 DIAGNOSIS — IMO0001 Reserved for inherently not codable concepts without codable children: Secondary | ICD-10-CM | POA: Diagnosis not present

## 2013-12-01 DIAGNOSIS — I129 Hypertensive chronic kidney disease with stage 1 through stage 4 chronic kidney disease, or unspecified chronic kidney disease: Secondary | ICD-10-CM | POA: Diagnosis present

## 2013-12-01 DIAGNOSIS — Y849 Medical procedure, unspecified as the cause of abnormal reaction of the patient, or of later complication, without mention of misadventure at the time of the procedure: Secondary | ICD-10-CM | POA: Diagnosis present

## 2013-12-01 DIAGNOSIS — I200 Unstable angina: Secondary | ICD-10-CM | POA: Diagnosis present

## 2013-12-01 DIAGNOSIS — I2582 Chronic total occlusion of coronary artery: Secondary | ICD-10-CM | POA: Diagnosis present

## 2013-12-01 DIAGNOSIS — E669 Obesity, unspecified: Secondary | ICD-10-CM | POA: Diagnosis present

## 2013-12-01 DIAGNOSIS — Z9989 Dependence on other enabling machines and devices: Secondary | ICD-10-CM

## 2013-12-01 DIAGNOSIS — R079 Chest pain, unspecified: Secondary | ICD-10-CM

## 2013-12-01 DIAGNOSIS — K219 Gastro-esophageal reflux disease without esophagitis: Secondary | ICD-10-CM | POA: Diagnosis present

## 2013-12-01 DIAGNOSIS — N183 Chronic kidney disease, stage 3 unspecified: Secondary | ICD-10-CM | POA: Diagnosis present

## 2013-12-01 DIAGNOSIS — E1121 Type 2 diabetes mellitus with diabetic nephropathy: Secondary | ICD-10-CM | POA: Diagnosis present

## 2013-12-01 DIAGNOSIS — Z951 Presence of aortocoronary bypass graft: Secondary | ICD-10-CM

## 2013-12-01 DIAGNOSIS — G4733 Obstructive sleep apnea (adult) (pediatric): Secondary | ICD-10-CM | POA: Diagnosis present

## 2013-12-01 DIAGNOSIS — E1159 Type 2 diabetes mellitus with other circulatory complications: Secondary | ICD-10-CM | POA: Diagnosis present

## 2013-12-01 DIAGNOSIS — Q245 Malformation of coronary vessels: Secondary | ICD-10-CM

## 2013-12-01 DIAGNOSIS — E119 Type 2 diabetes mellitus without complications: Secondary | ICD-10-CM

## 2013-12-01 DIAGNOSIS — Q613 Polycystic kidney, unspecified: Secondary | ICD-10-CM

## 2013-12-01 DIAGNOSIS — E785 Hyperlipidemia, unspecified: Secondary | ICD-10-CM | POA: Diagnosis present

## 2013-12-01 DIAGNOSIS — Z6838 Body mass index (BMI) 38.0-38.9, adult: Secondary | ICD-10-CM

## 2013-12-01 DIAGNOSIS — E1165 Type 2 diabetes mellitus with hyperglycemia: Secondary | ICD-10-CM

## 2013-12-01 DIAGNOSIS — Z794 Long term (current) use of insulin: Secondary | ICD-10-CM

## 2013-12-01 DIAGNOSIS — I2510 Atherosclerotic heart disease of native coronary artery without angina pectoris: Principal | ICD-10-CM | POA: Diagnosis present

## 2013-12-01 DIAGNOSIS — T82897A Other specified complication of cardiac prosthetic devices, implants and grafts, initial encounter: Secondary | ICD-10-CM | POA: Diagnosis present

## 2013-12-01 DIAGNOSIS — Z598 Other problems related to housing and economic circumstances: Secondary | ICD-10-CM

## 2013-12-01 DIAGNOSIS — Z7982 Long term (current) use of aspirin: Secondary | ICD-10-CM

## 2013-12-01 DIAGNOSIS — Z833 Family history of diabetes mellitus: Secondary | ICD-10-CM

## 2013-12-01 DIAGNOSIS — Z599 Problem related to housing and economic circumstances, unspecified: Secondary | ICD-10-CM

## 2013-12-01 DIAGNOSIS — I252 Old myocardial infarction: Secondary | ICD-10-CM

## 2013-12-01 LAB — URINALYSIS, ROUTINE W REFLEX MICROSCOPIC
Bilirubin Urine: NEGATIVE
HGB URINE DIPSTICK: NEGATIVE
KETONES UR: NEGATIVE mg/dL
LEUKOCYTES UA: NEGATIVE
Nitrite: NEGATIVE
PH: 6 (ref 5.0–8.0)
Protein, ur: NEGATIVE mg/dL
Specific Gravity, Urine: 1.019 (ref 1.005–1.030)
Urobilinogen, UA: 0.2 mg/dL (ref 0.0–1.0)

## 2013-12-01 LAB — CBC
HCT: 37.6 % — ABNORMAL LOW (ref 39.0–52.0)
Hemoglobin: 12.8 g/dL — ABNORMAL LOW (ref 13.0–17.0)
MCH: 29.2 pg (ref 26.0–34.0)
MCHC: 34 g/dL (ref 30.0–36.0)
MCV: 85.6 fL (ref 78.0–100.0)
PLATELETS: 221 10*3/uL (ref 150–400)
RBC: 4.39 MIL/uL (ref 4.22–5.81)
RDW: 13.1 % (ref 11.5–15.5)
WBC: 6.9 10*3/uL (ref 4.0–10.5)

## 2013-12-01 LAB — COMPREHENSIVE METABOLIC PANEL
ALT: 50 U/L (ref 0–53)
AST: 47 U/L — AB (ref 0–37)
Albumin: 3.9 g/dL (ref 3.5–5.2)
Alkaline Phosphatase: 105 U/L (ref 39–117)
BILIRUBIN TOTAL: 0.3 mg/dL (ref 0.3–1.2)
BUN: 26 mg/dL — ABNORMAL HIGH (ref 6–23)
CHLORIDE: 100 meq/L (ref 96–112)
CO2: 20 meq/L (ref 19–32)
Calcium: 9.5 mg/dL (ref 8.4–10.5)
Creatinine, Ser: 1.88 mg/dL — ABNORMAL HIGH (ref 0.50–1.35)
GFR calc Af Amer: 45 mL/min — ABNORMAL LOW (ref >90)
GFR, EST NON AFRICAN AMERICAN: 39 mL/min — AB (ref >90)
Glucose, Bld: 382 mg/dL — ABNORMAL HIGH (ref 70–99)
Potassium: 5.2 mEq/L (ref 3.7–5.3)
Sodium: 137 mEq/L (ref 137–147)
Total Protein: 7.6 g/dL (ref 6.0–8.3)

## 2013-12-01 LAB — I-STAT TROPONIN, ED: Troponin i, poc: 0 ng/mL (ref 0.00–0.08)

## 2013-12-01 LAB — GLUCOSE, CAPILLARY
GLUCOSE-CAPILLARY: 283 mg/dL — AB (ref 70–99)
GLUCOSE-CAPILLARY: 234 mg/dL — AB (ref 70–99)
Glucose-Capillary: 301 mg/dL — ABNORMAL HIGH (ref 70–99)

## 2013-12-01 LAB — TROPONIN I: Troponin I: <0.3 ng/mL (ref <0.30)

## 2013-12-01 LAB — PRO B NATRIURETIC PEPTIDE: Pro B Natriuretic peptide (BNP): 232.7 pg/mL — ABNORMAL HIGH (ref 0–125)

## 2013-12-01 LAB — URINE MICROSCOPIC-ADD ON

## 2013-12-01 LAB — HEPARIN LEVEL (UNFRACTIONATED): HEPARIN UNFRACTIONATED: 0.34 [IU]/mL (ref 0.30–0.70)

## 2013-12-01 MED ORDER — SODIUM CHLORIDE 0.9 % IV SOLN
INTRAVENOUS | Status: DC
  Administered 2013-12-01: 14:00:00 via INTRAVENOUS

## 2013-12-01 MED ORDER — GABAPENTIN 300 MG PO CAPS
300.0000 mg | ORAL_CAPSULE | Freq: Three times a day (TID) | ORAL | Status: DC
  Administered 2013-12-01 – 2013-12-04 (×8): 300 mg via ORAL
  Filled 2013-12-01 (×11): qty 1

## 2013-12-01 MED ORDER — HEPARIN BOLUS VIA INFUSION
4000.0000 [IU] | Freq: Once | INTRAVENOUS | Status: AC
  Administered 2013-12-01: 4000 [IU] via INTRAVENOUS
  Filled 2013-12-01: qty 4000

## 2013-12-01 MED ORDER — ASPIRIN EC 81 MG PO TBEC
81.0000 mg | DELAYED_RELEASE_TABLET | Freq: Every day | ORAL | Status: DC
  Administered 2013-12-02 – 2013-12-03 (×2): 81 mg via ORAL
  Filled 2013-12-01 (×3): qty 1

## 2013-12-01 MED ORDER — SODIUM CHLORIDE 0.9 % IV SOLN: 250.0000 mL | INTRAVENOUS | Status: DC | PRN

## 2013-12-01 MED ORDER — ZOLPIDEM TARTRATE 5 MG PO TABS
10.0000 mg | ORAL_TABLET | Freq: Every evening | ORAL | Status: DC | PRN
  Administered 2013-12-01 – 2013-12-03 (×3): 10 mg via ORAL
  Filled 2013-12-01 (×3): qty 2

## 2013-12-01 MED ORDER — INSULIN ASPART 100 UNIT/ML ~~LOC~~ SOLN
0.0000 [IU] | Freq: Three times a day (TID) | SUBCUTANEOUS | Status: DC
  Administered 2013-12-01: 8 [IU] via SUBCUTANEOUS
  Administered 2013-12-02: 3 [IU] via SUBCUTANEOUS
  Administered 2013-12-02: 5 [IU] via SUBCUTANEOUS
  Administered 2013-12-03: 18:00:00 2 [IU] via SUBCUTANEOUS
  Administered 2013-12-03 – 2013-12-04 (×3): 3 [IU] via SUBCUTANEOUS

## 2013-12-01 MED ORDER — ASPIRIN 81 MG PO CHEW: 81.0000 mg | CHEWABLE_TABLET | ORAL | Status: DC

## 2013-12-01 MED ORDER — AMLODIPINE BESYLATE 10 MG PO TABS
10.0000 mg | ORAL_TABLET | Freq: Every day | ORAL | Status: DC
  Administered 2013-12-02 – 2013-12-03 (×2): 10 mg via ORAL
  Filled 2013-12-01 (×3): qty 1

## 2013-12-01 MED ORDER — SIMVASTATIN 20 MG PO TABS
20.0000 mg | ORAL_TABLET | Freq: Every day | ORAL | Status: DC
  Administered 2013-12-01 – 2013-12-03 (×2): 20 mg via ORAL
  Filled 2013-12-01 (×4): qty 1

## 2013-12-01 MED ORDER — SODIUM CHLORIDE 0.9 % IJ SOLN
3.0000 mL | INTRAMUSCULAR | Status: DC | PRN
Start: 2013-12-01 — End: 2013-12-02

## 2013-12-01 MED ORDER — ISOSORBIDE MONONITRATE ER 60 MG PO TB24
90.0000 mg | ORAL_TABLET | Freq: Every day | ORAL | Status: DC
  Administered 2013-12-02 – 2013-12-04 (×3): 90 mg via ORAL
  Filled 2013-12-01 (×3): qty 1

## 2013-12-01 MED ORDER — CARVEDILOL 25 MG PO TABS: 25.0000 mg | ORAL_TABLET | ORAL | Status: DC

## 2013-12-01 MED ORDER — INSULIN GLARGINE 100 UNIT/ML SOLOSTAR PEN: 20.0000 [IU] | PEN_INJECTOR | Freq: Two times a day (BID) | SUBCUTANEOUS | Status: DC

## 2013-12-01 MED ORDER — CARVEDILOL 25 MG PO TABS
25.0000 mg | ORAL_TABLET | Freq: Every day | ORAL | Status: DC
  Administered 2013-12-01 – 2013-12-03 (×3): 25 mg via ORAL
  Filled 2013-12-01 (×4): qty 1

## 2013-12-01 MED ORDER — RANOLAZINE ER 500 MG PO TB12
500.0000 mg | ORAL_TABLET | Freq: Three times a day (TID) | ORAL | Status: DC
  Administered 2013-12-01 – 2013-12-04 (×8): 500 mg via ORAL
  Filled 2013-12-01 (×15): qty 1

## 2013-12-01 MED ORDER — HEPARIN (PORCINE) IN NACL 100-0.45 UNIT/ML-% IJ SOLN
1200.0000 [IU]/h | INTRAMUSCULAR | Status: DC
  Administered 2013-12-01: 1250 [IU]/h via INTRAVENOUS
  Administered 2013-12-02: 1200 [IU]/h via INTRAVENOUS
  Filled 2013-12-01 (×4): qty 250

## 2013-12-01 MED ORDER — NITROGLYCERIN 0.4 MG SL SUBL
0.4000 mg | SUBLINGUAL_TABLET | SUBLINGUAL | Status: DC | PRN
  Administered 2013-12-02 – 2013-12-03 (×4): 0.4 mg via SUBLINGUAL
  Filled 2013-12-01 (×2): qty 1

## 2013-12-01 MED ORDER — CARVEDILOL 25 MG PO TABS
50.0000 mg | ORAL_TABLET | Freq: Every day | ORAL | Status: DC
  Administered 2013-12-02: 50 mg via ORAL
  Filled 2013-12-01 (×2): qty 2

## 2013-12-01 MED ORDER — MORPHINE SULFATE 2 MG/ML IJ SOLN
2.0000 mg | Freq: Once | INTRAMUSCULAR | Status: AC
  Administered 2013-12-01: 2 mg via INTRAVENOUS
  Filled 2013-12-01: qty 1

## 2013-12-01 MED ORDER — PANTOPRAZOLE SODIUM 40 MG PO TBEC
40.0000 mg | DELAYED_RELEASE_TABLET | Freq: Two times a day (BID) | ORAL | Status: DC
  Administered 2013-12-01 – 2013-12-04 (×6): 40 mg via ORAL
  Filled 2013-12-01 (×6): qty 1

## 2013-12-01 MED ORDER — SODIUM CHLORIDE 0.9 % IJ SOLN
3.0000 mL | Freq: Two times a day (BID) | INTRAMUSCULAR | Status: DC
  Administered 2013-12-02: 3 mL via INTRAVENOUS

## 2013-12-01 MED ORDER — INSULIN GLARGINE 100 UNIT/ML ~~LOC~~ SOLN
20.0000 [IU] | Freq: Two times a day (BID) | SUBCUTANEOUS | Status: DC
  Administered 2013-12-01 – 2013-12-02 (×3): 20 [IU] via SUBCUTANEOUS
  Filled 2013-12-01 (×6): qty 0.2

## 2013-12-01 NOTE — ED Provider Notes (Signed)
CSN: [id number]     Arrival date & time 12/01/13  0906 History   First MD Initiated Contact with Patient 12/01/13 [phone]     Chief Complaint  Patient presents with  . Chest Pain  . Near Syncope     (Consider location/radiation/quality/duration/timing/severity/associated sxs/prior Treatment) HPI.... Patient recently admitted to the hospital for chest pain.   He had a followup visit with his cardiologist scheduled for today. This morning he experienced recurrent chest pain described as tightness and pressure along with associated dyspnea and diaphoresis. He took nitroglycerin and aspirin. He was transferred to the emergency department. Status post CABG in 2006 and also stents.  No radiation of pain. Severity is moderate. Exertion makes symptoms worse.  Past Medical History  Diagnosis Date  . Chest pain     Chronic  . Dyslipidemia   . Coronary artery disease     a. INF MI 1999: PCI of RCA;  b.  extensive stenting of LAD;  c. s/p CABG with RIMA->RCA in 2006;  d. Stable, Low risk MV 05/2012; e. 09/2012 Cath: LM 40-50d, and he LAD and 30 isr, patent mid stents, and 100d, LCX 70p, RI diff dzs, RCA (anomalous take off) and a 50p, patent RIMA->med rx. f. Abnl nuc 11/2013 -> med rx   . Hypertension   . Hx of echocardiogram     a. Echo 3/12:  mild LVH, EF 55-60%, trivial AI  . DM2 (diabetes mellitus, type 2)   . History of blood transfusion ?2011  . GERD (gastroesophageal reflux disease)     "once; came to hospital" (09/15/2012)  . OSA on CPAP   . CKD (chronic kidney disease)   . Polycystic kidney disease    Past Surgical History  Procedure Laterality Date  . Cystectomy  1990's    off face  . Coronary angioplasty with stent placement  1999 / 2002 / 2004 / 2006?  Marland Kitchen Cardiac catheterization  2012  . Coronary artery bypass graft  2006    CABG X?; in [place]  . Cardiac catheterization  2013  . Cardiac catheterization  2014    LHC (1/14): total occlusion of distal LAD, diffuse disease in ramus, 70%  proximal LCx, 50% proximal and mid RCA, 50% distal RCA, patent RIMA-RCA.   Family History  Problem Relation Age of Onset  . Cancer Mother     Lung  . Anemia Mother   . Kidney disease Mother     Polycystic  . Diabetes Father   . Heart attack Father 52    Died suddenly  . Heart failure Father   . Hyperlipidemia Father   . Hypertension Father   . Kidney failure Sister 5    Died  . Heart attack Sister   . Hypertension Sister   . Diabetes Sister   . Diabetes Brother   . Kidney disease Brother     Polcystic kidney disease  . Diabetes Sister    History  Substance Use Topics  . Smoking status: Never Smoker   . Smokeless tobacco: Never Used     Comment: smoked some as a teenager (high school)  . Alcohol Use: No     Comment: 09/15/2012 "drank a little when I was young"    Review of Systems  All other systems reviewed and are negative.      Allergies  Ciprocin-fluocin-procin; Clarithromycin; Cleocin; and Glimepiride  Home Medications   Current Outpatient Rx  Name  Route  Sig  Dispense  Refill  . amLODipine (NORVASC) 10  MG tablet   Oral   Take 10 mg by mouth daily.         Marland Kitchen aspirin 325 MG tablet   Oral   Take 325 mg by mouth daily.         . carvedilol (COREG) 25 MG tablet   Oral   Take 25-50 mg by mouth See admin instructions. Take 50 mg in the morning with meals, then take 25 mg in the evening with meals.         . diphenhydramine-acetaminophen (TYLENOL PM) 25-500 MG TABS   Oral   Take 2 tablets by mouth at bedtime.         . gabapentin (NEURONTIN) 300 MG capsule   Oral   Take 300 mg by mouth 3 (three) times daily.         . Insulin Glargine (LANTUS SOLOSTAR) 100 UNIT/ML Solostar Pen   Subcutaneous   Inject 20-30 Units into the skin 2 (two) times daily. Usually takes 20 units, if blood sugars are elevated can take up to 30 units   15 pen   0   . isosorbide mononitrate (IMDUR) 30 MG 24 hr tablet   Oral   Take 90 mg by mouth daily.          Marland Kitchen losartan (COZAAR) 100 MG tablet   Oral   Take 100 mg by mouth daily.         . nitroGLYCERIN (NITROSTAT) 0.4 MG SL tablet   Sublingual   Place 0.4 mg under the tongue every 5 (five) minutes as needed for chest pain.         . pantoprazole (PROTONIX) 40 MG tablet   Oral   Take 1 tablet (40 mg total) by mouth 2 (two) times daily with a meal.   180 tablet   3   . pravastatin (PRAVACHOL) 40 MG tablet   Oral   Take 40 mg by mouth daily.         . ranolazine (RANEXA) 500 MG 12 hr tablet   Oral   Take 500 mg by mouth every 6 (six) hours.         . vitamin E 400 UNIT capsule   Oral   Take 1,200 Units by mouth daily.         Marland Kitchen zolpidem (AMBIEN) 10 MG tablet   Oral   Take 10 mg by mouth at bedtime.          BP 140/82  Pulse 84  Temp(Src) 98 F (36.7 C) (Oral)  SpO2 99% Physical Exam  Nursing note and vitals reviewed. Constitutional: He is oriented to person, place, and time. He appears well-developed and well-nourished.  HENT:  Head: Normocephalic and atraumatic.  Eyes: Conjunctivae and EOM are normal. Pupils are equal, round, and reactive to light.  Neck: Normal range of motion. Neck supple.  Cardiovascular: Normal rate, regular rhythm and normal heart sounds.   Pulmonary/Chest: Effort normal and breath sounds normal.  Abdominal: Soft. Bowel sounds are normal.  Musculoskeletal: Normal range of motion.  Neurological: He is alert and oriented to person, place, and time.  Skin: Skin is warm and dry.  Psychiatric: He has a normal mood and affect. His behavior is normal.    ED Course  Procedures (including critical care time) Labs Review Labs Reviewed  CBC - Abnormal; Notable for the following:    Hemoglobin 12.8 (*)    HCT 37.6 (*)    All other components within normal limits  PRO  B NATRIURETIC PEPTIDE - Abnormal; Notable for the following:    Pro B Natriuretic peptide (BNP) 232.7 (*)    All other components within normal limits  URINALYSIS, ROUTINE W  REFLEX MICROSCOPIC - Abnormal; Notable for the following:    Glucose, UA >1000 (*)    All other components within normal limits  COMPREHENSIVE METABOLIC PANEL - Abnormal; Notable for the following:    Glucose, Bld 382 (*)    BUN 26 (*)    Creatinine, Ser 1.88 (*)    AST 47 (*)    GFR calc non Af Amer 39 (*)    GFR calc Af Amer 45 (*)    All other components within normal limits  URINE MICROSCOPIC-ADD ON  I-STAT TROPOININ, ED   Imaging Review No results found.   EKG Interpretation None      Date: 12/01/2013  Rate: 84  Rhythm: normal sinus rhythm  QRS Axis: normal  Intervals: normal  ST/T Wave abnormalities: normal  Conduction Disutrbances: none  Narrative Interpretation: unremarkable    MDM   Final diagnoses:  None    Patient has known coronary artery disease. He is hemodynamically stable. Consult cardiology. Probable admission    Nat Christen, MD 12/01/13 1105

## 2013-12-01 NOTE — Progress Notes (Signed)
CARDIOLOGY ADMISSION NOTE  Patient ID: Matthew Kirks Sr. MRN: [id number] DOB/AGE: May 21, 1961 53 y.o.  Admit date: 12/01/2013 Primary Physician   Lamar Blinks, MD Primary Cardiologist   Dr. Aundra Dubin Chief Complaint    Chest pain  HPI:  The patient was discharged on the 22nd after an evaluation of chest pain.  He has an extensive history of CAD as described below.  However, because of renal insufficiency and it was decided to pursue a noninvasive evaluation .  He had a [employer] which showed a fixed apical defect. The defect in the inferolateral wall demonstrated partial reversibility with a defect that was felt to be more extensive than previous. The EF was 54%. There was an issue of medication compliance particularly with Ranexa.    He said that on Sunday and Monday he felt OK.  However, this am he had moderate pain in his left chest.  It was sharp and heavy.  He did not describe radiation or associated symptoms.  He took NTG, gabapentin and ASA and the symptoms eased in about 10 minutes.  He had an appt in our office and presented for this.  However, in the waiting room he had severe pain after waking from the parking lot.  It was 10/10.  It was so bad he doubled over.  He became SOB, dizzy and had to sit on the floor.  He did receive NTG x 2.  He had improvement in the pain but reports that it is still 5/10.  He denies any other symptoms at this point.  Initial POC troponin was negative.  EKG demonstrated no acute findings.     Past Medical History  Diagnosis Date  . Chest pain     Chronic  . Dyslipidemia   . Coronary artery disease     a. INF MI 1999: PCI of RCA;  b.  extensive stenting of LAD;  c. s/p CABG with RIMA->RCA in 2006;  d. Stable, Low risk MV 05/2012; e. 09/2012 Cath: LM 40-50d, and he LAD and 30 isr, patent mid stents, and 100d, LCX 70p, RI diff dzs, RCA (anomalous take off) and a 50p, patent RIMA->med rx. f. Abnl nuc 11/2013 -> med rx   . Hypertension   . Hx of  echocardiogram     a. Echo 3/12:  mild LVH, EF 55-60%, trivial AI  . DM2 (diabetes mellitus, type 2)   . History of blood transfusion ?2011  . GERD (gastroesophageal reflux disease)     "once; came to hospital" (09/15/2012)  . OSA on CPAP   . CKD (chronic kidney disease)   . Polycystic kidney disease     Past Surgical History  Procedure Laterality Date  . Cystectomy  1990's    off face  . Coronary angioplasty with stent placement  1999 / 2002 / 2004 / 2006?  Marland Kitchen Cardiac catheterization  2012  . Coronary artery bypass graft  2006    CABG X?; in [place]  . Cardiac catheterization  2013  . Cardiac catheterization  2014    LHC (1/14): total occlusion of distal LAD, diffuse disease in ramus, 70% proximal LCx, 50% proximal and mid RCA, 50% distal RCA, patent RIMA-RCA.    Allergies  Allergen Reactions  . Ciprocin-Fluocin-Procin [Fluocinolone Acetonide] Rash  . Clarithromycin Itching and Other (See Comments)    "Biaxin" Eyes itch and burn  . Cleocin [Clindamycin Hcl] Anaphylaxis and Swelling  . Glimepiride [Amaryl] Other (See Comments)    Elevates liver function  Prior to Admission medications   Medication Sig Start Date End Date Taking? Authorizing Provider  amLODipine (NORVASC) 10 MG tablet Take 10 mg by mouth daily.   Yes Historical Provider, MD  aspirin 325 MG tablet Take 325 mg by mouth daily.   Yes Historical Provider, MD  carvedilol (COREG) 25 MG tablet Take 25-50 mg by mouth See admin instructions. Take 50 mg in the morning with meals, then take 25 mg in the evening with meals.   Yes Historical Provider, MD  diphenhydramine-acetaminophen (TYLENOL PM) 25-500 MG TABS Take 2 tablets by mouth at bedtime.   Yes Historical Provider, MD  gabapentin (NEURONTIN) 300 MG capsule Take 300 mg by mouth 3 (three) times daily.   Yes Historical Provider, MD  Insulin Glargine (LANTUS SOLOSTAR) 100 UNIT/ML Solostar Pen Inject 20-30 Units into the skin 2 (two) times daily. Usually takes 20 units,  if blood sugars are elevated can take up to 30 units 09/18/13  Yes Gay Filler Copland, MD  isosorbide mononitrate (IMDUR) 30 MG 24 hr tablet Take 90 mg by mouth daily.   Yes Historical Provider, MD  losartan (COZAAR) 100 MG tablet Take 100 mg by mouth daily.   Yes Historical Provider, MD  nitroGLYCERIN (NITROSTAT) 0.4 MG SL tablet Place 0.4 mg under the tongue every 5 (five) minutes as needed for chest pain.   Yes Historical Provider, MD  pantoprazole (PROTONIX) 40 MG tablet Take 1 tablet (40 mg total) by mouth 2 (two) times daily with a meal. 09/30/13 09/30/14 Yes Jessica C Copland, MD  pravastatin (PRAVACHOL) 40 MG tablet Take 40 mg by mouth daily.   Yes Historical Provider, MD  ranolazine (RANEXA) 500 MG 12 hr tablet Take 500 mg by mouth every 6 (six) hours.   Yes Historical Provider, MD  vitamin E 400 UNIT capsule Take 1,200 Units by mouth daily.   Yes Historical Provider, MD  zolpidem (AMBIEN) 10 MG tablet Take 10 mg by mouth at bedtime.   Yes Historical Provider, MD    History   Social History  . Marital Status: Married    Spouse Name: [name]    Number of Children: 5  . Years of Education: N/A   Occupational History  . retired     Chartered loss adjuster   Social History Main Topics  . Smoking status: Never Smoker   . Smokeless tobacco: Never Used     Comment: smoked some as a teenager (high school)  . Alcohol Use: No     Comment: 09/15/2012 "drank a little when I was young"  . Drug Use: No  . Sexual Activity: Yes   Other Topics Concern  . Not on file   Social History Narrative   Married and lives with wife in [place]. On disability.     Family History  Problem Relation Age of Onset  . Cancer Mother     Lung  . Anemia Mother   . Kidney disease Mother     Polycystic  . Diabetes Father   . Heart attack Father 41    Died suddenly  . Heart failure Father   . Hyperlipidemia Father   . Hypertension Father   . Kidney failure Sister 5    Died  . Heart attack Sister   . Hypertension  Sister   . Diabetes Sister   . Diabetes Brother   . Kidney disease Brother     Polcystic kidney disease  . Diabetes Sister      ROS:  As stated in the  HPI and negative for all other systems.  Physical Exam: Blood pressure 125/95, pulse 84, temperature 98 F (36.7 C), temperature source Oral, resp. rate 18, height 5\' 5"  (1.651 m), weight 232 lb (105.235 kg), SpO2 97.00%.  GENERAL:  Well appearing HEENT:  Pupils equal round and reactive, fundi not visualized, oral mucosa unremarkable NECK:  No jugular venous distention, waveform within normal limits, carotid upstroke brisk and symmetric, no bruits, no thyromegaly LYMPHATICS:  No cervical, inguinal adenopathy LUNGS:  Clear to auscultation bilaterally BACK:  No CVA tenderness CHEST:  Well healed sternotomy scar. HEART:  PMI not displaced or sustained,S1 and S2 within normal limits, no S3, no S4, no clicks, no rubs, no murmurs ABD:  Flat, positive bowel sounds normal in frequency in pitch, no bruits, no rebound, no guarding, no midline pulsatile mass, no hepatomegaly, no splenomegaly EXT:  2 plus pulses throughout, no edema, no cyanosis no clubbing SKIN:  No rashes no nodules NEURO:  Cranial nerves II through XII grossly intact, motor grossly intact throughout PSYCH:  Cognitively intact, oriented to person place and time  Labs:   Lab Results  Component Value Date   WBC 6.9 12/01/2013   HGB 12.8* 12/01/2013   HCT 37.6* 12/01/2013   MCV 85.6 12/01/2013   PLT 221 12/01/2013    EKG:  NSR, rate 84, axis left, old inferior MI.  Poor anterior R wave progression, no acute ST T wave changes.    ASSESSMENT AND PLAN:    CHEST PAIN:  Initial POC troponin is negative.  Given the nature of the disease, the abnormal stress test last week, the severity of the symptoms and his past history the possibility of high grade obstructive CAD is high.  Therefore cardiac cath is indicated.  The patient understands that risks included but are not limited to  stroke (1 in 1000), death (1 in 56), bleeding (1 in 200), allergic reaction [possibly serious] (1 in 200).   We discussed in particular the risk of renal dysfunction/failure which is certainly increased in this situation.   He understands that we would try to reduce this risk with hydration and limiting the exposure to dye.  However, he understands that this procedure could lead to temporary kidney failure or permanent failure requiring dialysis.  The patient understands and agrees to proceed.  We will admit with plans for hydration starting tonight, repeat a BMET in the AM.  He is tentatively on the cath board with Dr. Martin[place].  We will start IV heparin and cycle enzymes.   CKD:  As above.      HTN:   His BP is borderline.  For now continue the current therapy.   DM:  A1C last was 10.2.  His wife reports that he has not been as compliant with lifestyle as he should have been.  We will continue current therapy.  We talked about med and diet compliance and close follow up with Lamar Blinks, MD  Signed: Minus Breeding 12/01/2013, 12:55 PM

## 2013-12-01 NOTE — Progress Notes (Signed)
ANTICOAGULATION CONSULT NOTE - Initial Consult  Pharmacy Consult for heparin Indication: chest pain/ACS  Allergies  Allergen Reactions  . Ciprocin-Fluocin-Procin [Fluocinolone Acetonide] Rash  . Clarithromycin Itching and Other (See Comments)    "Biaxin" Eyes itch and burn  . Cleocin [Clindamycin Hcl] Anaphylaxis and Swelling  . Glimepiride [Amaryl] Other (See Comments)    Elevates liver function     Patient Measurements: Height: 5\' 5"  (165.1 cm) Weight: 232 lb (105.235 kg) IBW/kg (Calculated) : 61.5 Heparin Dosing Weight: 85.4 kg  Vital Signs: Temp: 98 F (36.7 C) (03/24 0907) Temp src: Oral (03/24 1206) BP: 125/95 mmHg (03/24 1206) Pulse Rate: 84 (03/24 0907)  Labs:  Recent Labs  11/29/13 0516 12/01/13 0940  HGB 12.6* 12.8*  HCT 36.1* 37.6*  PLT 222 221  HEPARINUNFRC 0.65  --   CREATININE 2.00* 1.88*    Estimated Creatinine Clearance: 50.8 ml/min (by C-G formula based on Cr of 1.88).   Medical History: Past Medical History  Diagnosis Date  . Chest pain     Chronic  . Dyslipidemia   . Coronary artery disease     a. INF MI 1999: PCI of RCA;  b.  extensive stenting of LAD;  c. s/p CABG with RIMA->RCA in 2006;  d. Stable, Low risk MV 05/2012; e. 09/2012 Cath: LM 40-50d, and he LAD and 30 isr, patent mid stents, and 100d, LCX 70p, RI diff dzs, RCA (anomalous take off) and a 50p, patent RIMA->med rx. f. Abnl nuc 11/2013 -> med rx   . Hypertension   . Hx of echocardiogram     a. Echo 3/12:  mild LVH, EF 55-60%, trivial AI  . DM2 (diabetes mellitus, type 2)   . History of blood transfusion ?2011  . GERD (gastroesophageal reflux disease)     "once; came to hospital" (09/15/2012)  . OSA on CPAP   . CKD (chronic kidney disease)   . Polycystic kidney disease     Medications:  Prescriptions prior to admission  Medication Sig Dispense Refill  . amLODipine (NORVASC) 10 MG tablet Take 10 mg by mouth daily.      Marland Kitchen aspirin 325 MG tablet Take 325 mg by mouth daily.       . carvedilol (COREG) 25 MG tablet Take 25-50 mg by mouth See admin instructions. Take 50 mg in the morning with meals, then take 25 mg in the evening with meals.      . diphenhydramine-acetaminophen (TYLENOL PM) 25-500 MG TABS Take 2 tablets by mouth at bedtime.      . gabapentin (NEURONTIN) 300 MG capsule Take 300 mg by mouth 3 (three) times daily.      . Insulin Glargine (LANTUS SOLOSTAR) 100 UNIT/ML Solostar Pen Inject 20-30 Units into the skin 2 (two) times daily. Usually takes 20 units, if blood sugars are elevated can take up to 30 units  15 pen  0  . isosorbide mononitrate (IMDUR) 30 MG 24 hr tablet Take 90 mg by mouth daily.      Marland Kitchen losartan (COZAAR) 100 MG tablet Take 100 mg by mouth daily.      . nitroGLYCERIN (NITROSTAT) 0.4 MG SL tablet Place 0.4 mg under the tongue every 5 (five) minutes as needed for chest pain.      . pantoprazole (PROTONIX) 40 MG tablet Take 1 tablet (40 mg total) by mouth 2 (two) times daily with a meal.  180 tablet  3  . pravastatin (PRAVACHOL) 40 MG tablet Take 40 mg by mouth daily.      Marland Kitchen  ranolazine (RANEXA) 500 MG 12 hr tablet Take 500 mg by mouth every 6 (six) hours.      . vitamin E 400 UNIT capsule Take 1,200 Units by mouth daily.      Marland Kitchen zolpidem (AMBIEN) 10 MG tablet Take 10 mg by mouth at bedtime.        Assessment: 53 y/o male with CAD who presents to the ED with recurrent CP. He was discharged from [facility] on 3/22 after a non invasive work-up of CP due to renal insufficiency. With the recent abnormal stress test and current symptoms, the plan is for cath. Pharmacy consulted to begin heparin. During previous admit, heparin was therapeutic on 1250 units/hr. Renal function remains stable since last admit, currently SCr is 1.88. No bleeding noted, H/H are stable, platelets are normal.  Goal of Therapy:  Heparin level 0.3-0.7 units/ml Monitor platelets by anticoagulation protocol: Yes   Plan:  - Heparin 4000 units IV bolus then infuse at 1250 units/hr - 6 hr  heparin level - Daily heparin level and CBC - Monitor for s/sx of bleeding  [facility], [place].D., BCPS Clinical Pharmacist Pager: [phone] 12/01/2013 12:40 PM

## 2013-12-01 NOTE — ED Notes (Signed)
Cardiologist at bedside.  

## 2013-12-01 NOTE — Progress Notes (Signed)
ANTICOAGULATION CONSULT NOTE - [place] for heparin Indication: chest pain/ACS  Allergies  Allergen Reactions  . Ciprocin-Fluocin-Procin [Fluocinolone Acetonide] Rash  . Clarithromycin Itching and Other (See Comments)    "Biaxin" Eyes itch and burn  . Cleocin [Clindamycin Hcl] Anaphylaxis and Swelling  . Glimepiride [Amaryl] Other (See Comments)    Elevates liver function     Patient Measurements: Height: 5\' 5"  (165.1 cm) Weight: 232 lb (105.235 kg) IBW/kg (Calculated) : 61.5 Heparin Dosing Weight: 85.4 kg  Vital Signs: Temp: 98 F (36.7 C) (03/24 0907) Temp src: Oral (03/24 1206) BP: 125/95 mmHg (03/24 1206) Pulse Rate: 84 (03/24 0907)  Labs:  Recent Labs  11/29/13 0516 12/01/13 0940 12/01/13 1236 12/01/13 1812 12/01/13 1957  HGB 12.6* 12.8*  --   --   --   HCT 36.1* 37.6*  --   --   --   PLT 222 221  --   --   --   HEPARINUNFRC 0.65  --   --   --  0.34  CREATININE 2.00* 1.88*  --   --   --   TROPONINI  --   --  <0.30 <0.30  --     Estimated Creatinine Clearance: 50.8 ml/min (by C-G formula based on Cr of 1.88).   Medical History: Past Medical History  Diagnosis Date  . Chest pain     Chronic  . Dyslipidemia   . Coronary artery disease     a. INF MI 1999: PCI of RCA;  b.  extensive stenting of LAD;  c. s/p CABG with RIMA->RCA in 2006;  d. Stable, Low risk MV 05/2012; e. 09/2012 Cath: LM 40-50d, and he LAD and 30 isr, patent mid stents, and 100d, LCX 70p, RI diff dzs, RCA (anomalous take off) and a 50p, patent RIMA->med rx. f. Abnl nuc 11/2013 -> med rx   . Hypertension   . Hx of echocardiogram     a. Echo 3/12:  mild LVH, EF 55-60%, trivial AI  . DM2 (diabetes mellitus, type 2)   . History of blood transfusion ?2011  . GERD (gastroesophageal reflux disease)     "once; came to hospital" (09/15/2012)  . OSA on CPAP   . CKD (chronic kidney disease)   . Polycystic kidney disease     Medications:  Prescriptions prior to admission   Medication Sig Dispense Refill  . amLODipine (NORVASC) 10 MG tablet Take 10 mg by mouth daily.      Marland Kitchen aspirin 325 MG tablet Take 325 mg by mouth daily.      . carvedilol (COREG) 25 MG tablet Take 25-50 mg by mouth See admin instructions. Take 50 mg in the morning with meals, then take 25 mg in the evening with meals.      . diphenhydramine-acetaminophen (TYLENOL PM) 25-500 MG TABS Take 2 tablets by mouth at bedtime.      . gabapentin (NEURONTIN) 300 MG capsule Take 300 mg by mouth 3 (three) times daily.      . Insulin Glargine (LANTUS SOLOSTAR) 100 UNIT/ML Solostar Pen Inject 20-30 Units into the skin 2 (two) times daily. Usually takes 20 units, if blood sugars are elevated can take up to 30 units  15 pen  0  . isosorbide mononitrate (IMDUR) 30 MG 24 hr tablet Take 90 mg by mouth daily.      Marland Kitchen losartan (COZAAR) 100 MG tablet Take 100 mg by mouth daily.      . nitroGLYCERIN (NITROSTAT) 0.4 MG  SL tablet Place 0.4 mg under the tongue every 5 (five) minutes as needed for chest pain.      . pantoprazole (PROTONIX) 40 MG tablet Take 1 tablet (40 mg total) by mouth 2 (two) times daily with a meal.  180 tablet  3  . pravastatin (PRAVACHOL) 40 MG tablet Take 40 mg by mouth daily.      . ranolazine (RANEXA) 500 MG 12 hr tablet Take 500 mg by mouth every 6 (six) hours.      . vitamin E 400 UNIT capsule Take 1,200 Units by mouth daily.      Marland Kitchen zolpidem (AMBIEN) 10 MG tablet Take 10 mg by mouth at bedtime.       . sodium chloride 75 mL/hr at 12/01/13 1421  . heparin 1,250 Units/hr (12/01/13 1422)     Assessment: 53 y/o male with CAD who presents to the ED with recurrent CP. He was discharged from [facility] on 3/22 after a non invasive work-up of CP due to renal insufficiency. With the recent abnormal stress test and current symptoms, the plan is for cath. Pharmacy consulted to begin heparin. During previous admit, heparin was therapeutic on 1250 units/hr. Renal function remains stable since last admit, currently  SCr is 1.88. No bleeding noted, H/H are stable, platelets are normal.  Initial heparin level therapeutic on current rate.    Goal of Therapy:  Heparin level 0.3-0.7 units/ml Monitor platelets by anticoagulation protocol: Yes   Plan:  - Continue IV heparin at current rate. - F/U AM heparin level. - Daily heparin level and CBC.  Uvaldo Rising, BCPS  Clinical Pharmacist Pager [phone]  12/01/2013 8:38 PM

## 2013-12-01 NOTE — ED Notes (Signed)
Pt seen here last Thursday for chest pain. This morning pt states started having chest pain 10/10 this morning around 0830- went to MD appointment this morning. While at appointment, pt's chest pressure became so great he bent over and felt very light headed, no LOC. Pt took 325 ASA and 2 nitro with pain coming down to 8/10. EMS reports no EKG changes, BP 151/93

## 2013-12-02 ENCOUNTER — Encounter (HOSPITAL_COMMUNITY)
Admission: EM | Disposition: A | Discharge status: Home / Self Care | Payer: Commercial Managed Care - HMO | Source: Home / Self Care | Attending: Cardiology

## 2013-12-02 DIAGNOSIS — I10 Essential (primary) hypertension: Secondary | ICD-10-CM

## 2013-12-02 DIAGNOSIS — N189 Chronic kidney disease, unspecified: Secondary | ICD-10-CM

## 2013-12-02 DIAGNOSIS — I2510 Atherosclerotic heart disease of native coronary artery without angina pectoris: Secondary | ICD-10-CM | POA: Diagnosis not present

## 2013-12-02 HISTORY — PX: LEFT HEART CATHETERIZATION WITH CORONARY/GRAFT ANGIOGRAM: SHX5450

## 2013-12-02 LAB — BASIC METABOLIC PANEL
BUN: 24 mg/dL — ABNORMAL HIGH (ref 6–23)
CO2: 21 mEq/L (ref 19–32)
CREATININE: 1.98 mg/dL — AB (ref 0.50–1.35)
Calcium: 9 mg/dL (ref 8.4–10.5)
Chloride: 104 mEq/L (ref 96–112)
GFR, EST AFRICAN AMERICAN: 43 mL/min — AB (ref >90)
GFR, EST NON AFRICAN AMERICAN: 37 mL/min — AB (ref >90)
Glucose, Bld: 293 mg/dL — ABNORMAL HIGH (ref 70–99)
Potassium: 5 mEq/L (ref 3.7–5.3)
Sodium: 139 mEq/L (ref 137–147)

## 2013-12-02 LAB — TROPONIN I

## 2013-12-02 LAB — GLUCOSE, CAPILLARY
GLUCOSE-CAPILLARY: 206 mg/dL — AB (ref 70–99)
Glucose-Capillary: 170 mg/dL — ABNORMAL HIGH (ref 70–99)
Glucose-Capillary: 136 mg/dL — ABNORMAL HIGH (ref 70–99)
Glucose-Capillary: 119 mg/dL — ABNORMAL HIGH (ref 70–99)
Glucose-Capillary: 248 mg/dL — ABNORMAL HIGH (ref 70–99)

## 2013-12-02 LAB — CBC
HCT: 35.4 % — ABNORMAL LOW (ref 39.0–52.0)
HEMOGLOBIN: 12 g/dL — AB (ref 13.0–17.0)
MCH: 28.8 pg (ref 26.0–34.0)
MCHC: 33.9 g/dL (ref 30.0–36.0)
MCV: 85.1 fL (ref 78.0–100.0)
PLATELETS: 218 10*3/uL (ref 150–400)
RBC: 4.16 MIL/uL — ABNORMAL LOW (ref 4.22–5.81)
RDW: 13 % (ref 11.5–15.5)
WBC: 9.1 10*3/uL (ref 4.0–10.5)

## 2013-12-02 LAB — HEPARIN LEVEL (UNFRACTIONATED): Heparin Unfractionated: 0.69 IU/mL (ref 0.30–0.70)

## 2013-12-02 LAB — POCT ACTIVATED CLOTTING TIME: Activated Clotting Time: 415 seconds

## 2013-12-02 SURGERY — LEFT HEART CATHETERIZATION WITH CORONARY/GRAFT ANGIOGRAM
Anesthesia: LOCAL

## 2013-12-02 MED ORDER — CLOPIDOGREL BISULFATE 300 MG PO TABS
ORAL_TABLET | ORAL | Status: AC
  Filled 2013-12-02: qty 1

## 2013-12-02 MED ORDER — SODIUM CHLORIDE 0.9 % IV SOLN
INTRAVENOUS | Status: DC
  Administered 2013-12-03: 100 mL/h via INTRAVENOUS

## 2013-12-02 MED ORDER — ASPIRIN 81 MG PO CHEW
CHEWABLE_TABLET | ORAL | Status: AC
  Filled 2013-12-02: qty 1

## 2013-12-02 MED ORDER — BIVALIRUDIN 250 MG IV SOLR
INTRAVENOUS | Status: AC
  Filled 2013-12-02: qty 250

## 2013-12-02 MED ORDER — SODIUM CHLORIDE 0.9 % IV SOLN
1.7500 mg/kg/h | INTRAVENOUS | Status: DC
  Filled 2013-12-02: qty 250

## 2013-12-02 MED ORDER — LIVING WELL WITH DIABETES BOOK
Freq: Once | Status: AC
  Administered 2013-12-02: 23:00:00
  Filled 2013-12-02: qty 1

## 2013-12-02 MED ORDER — FENTANYL CITRATE 0.05 MG/ML IJ SOLN
INTRAMUSCULAR | Status: AC
  Filled 2013-12-02: qty 2

## 2013-12-02 MED ORDER — ACTIVE PARTNERSHIP FOR HEALTH OF YOUR HEART BOOK
Freq: Once | Status: AC
  Administered 2013-12-02: 23:00:00
  Filled 2013-12-02: qty 1

## 2013-12-02 MED ORDER — MIDAZOLAM HCL 2 MG/2ML IJ SOLN
INTRAMUSCULAR | Status: AC
  Filled 2013-12-02: qty 2

## 2013-12-02 MED ORDER — HEPARIN (PORCINE) IN NACL 2-0.9 UNIT/ML-% IJ SOLN
INTRAMUSCULAR | Status: AC
  Filled 2013-12-02: qty 1000

## 2013-12-02 MED ORDER — VERAPAMIL HCL 2.5 MG/ML IV SOLN
INTRAVENOUS | Status: AC
  Filled 2013-12-02: qty 2

## 2013-12-02 MED ORDER — NITROGLYCERIN 0.2 MG/ML ON CALL CATH LAB
INTRAVENOUS | Status: AC
  Filled 2013-12-02: qty 1

## 2013-12-02 MED ORDER — CLOPIDOGREL BISULFATE 75 MG PO TABS
75.0000 mg | ORAL_TABLET | Freq: Every day | ORAL | Status: DC
  Administered 2013-12-03 – 2013-12-04 (×2): 75 mg via ORAL
  Filled 2013-12-02 (×2): qty 1

## 2013-12-02 MED ORDER — HEPARIN SODIUM (PORCINE) 1000 UNIT/ML IJ SOLN
INTRAMUSCULAR | Status: AC
  Filled 2013-12-02: qty 1

## 2013-12-02 MED ORDER — LIDOCAINE HCL (PF) 1 % IJ SOLN
INTRAMUSCULAR | Status: AC
  Filled 2013-12-02: qty 30

## 2013-12-02 NOTE — Interval H&P Note (Signed)
Cath Lab Visit (complete for each Cath Lab visit)  Clinical Evaluation Leading to the Procedure:   ACS: yes  Non-ACS:    Anginal Classification: CCS IV  Anti-ischemic medical therapy: Maximal Therapy (2 or more classes of medications)  Non-Invasive Test Results: No non-invasive testing performed  Prior CABG: Previous CABG      History and Physical Interval Note:  12/02/2013 4:55 PM  Karie Kirks Sr.  has presented today for surgery, with the diagnosis of CP  The various methods of treatment have been discussed with the patient and family. After consideration of risks, benefits and other options for treatment, the patient has consented to  Procedure(s): LEFT HEART CATHETERIZATION WITH CORONARY/GRAFT ANGIOGRAM (N/A) as a surgical intervention .  The patient's history has been reviewed, patient examined, no change in status, stable for surgery.  I have reviewed the patient's chart and labs.  Questions were answered to the patient's satisfaction.     Kathlyn Sacramento

## 2013-12-02 NOTE — Progress Notes (Signed)
ANTICOAGULATION CONSULT NOTE - [place] for heparin Indication: chest pain/ACS  Allergies  Allergen Reactions  . Ciprocin-Fluocin-Procin [Fluocinolone Acetonide] Rash  . Clarithromycin Itching and Other (See Comments)    "Biaxin" Eyes itch and burn  . Cleocin [Clindamycin Hcl] Anaphylaxis and Swelling  . Glimepiride [Amaryl] Other (See Comments)    Elevates liver function     Patient Measurements: Height: 5\' 5"  (165.1 cm) Weight: 232 lb (105.235 kg) IBW/kg (Calculated) : 61.5 Heparin Dosing Weight: 85.4 kg  Vital Signs: Temp: 98.4 F (36.9 C) (03/25 0607) Temp src: Oral (03/25 0607) BP: 134/81 mmHg (03/25 0607)  Labs:  Recent Labs  12/01/13 0940 12/01/13 1236 12/01/13 1812 12/01/13 1957 12/02/13 0118 12/02/13 0433  HGB 12.8*  --   --   --   --  12.0*  HCT 37.6*  --   --   --   --  35.4*  PLT 221  --   --   --   --  218  HEPARINUNFRC  --   --   --  0.34  --  0.69  CREATININE 1.88*  --   --   --   --  1.98*  TROPONINI  --  <0.30 <0.30  --  <0.30  --     Estimated Creatinine Clearance: 48.2 ml/min (by C-G formula based on Cr of 1.98).   Medications:  Infusions . sodium chloride 75 mL/hr at 12/01/13 1421  . heparin 1,250 Units/hr (12/01/13 1422)    Assessment: 53 y/o male with CAD who presents to the ED with recurrent CP. He was discharged from [facility] on 3/22 after a non invasive work-up of CP due to renal insufficiency. With the recent abnormal stress test and current symptoms, the plan is for cath. Pharmacy consulted to begin heparin. During previous admit, heparin was therapeutic on 1250 units/hr. Renal function remains stable since last admit, currently SCr is 1.88. No bleeding noted, H/H are stable, platelets are normal.  Heparin level this morning is therapeutic on the upper end of normal. Will decrease rate slightly and f/u after cath.    Goal of Therapy:  Heparin level 0.3-0.7 units/ml Monitor platelets by anticoagulation  protocol: Yes   Plan:  - Decrease heparin drip to 1200 units/hr - Daily heparin level and CBC - F/U after cath  [facility], Pharm.D., BCPS Clinical Pharmacist Pager: [phone] 12/02/2013 10:25 AM

## 2013-12-02 NOTE — CV Procedure (Signed)
Cardiac Catheterization Procedure Note  Name: Matthew GUNAWAN Sr. MRN: [id number] DOB: 1961-04-21  Procedure: Left Heart Cath, Selective Coronary Angiography, LV angiography  Indication: unstable angina  Medications:  Sedation:  1 mg IV Versed, 50 mcg IV Fentanyl  Contrast:  125 ml Omnipaque   Procedural Details: The right wrist was prepped, draped, and anesthetized with 1% lidocaine. Using the modified Seldinger technique, a 5 French sheath was introduced into the right radial artery. 3 mg of verapamil was administered through the sheath, weight-based unfractionated heparin was administered intravenously. A Jackie catheter was used for selective coronary angiography. An AL2 was used to engage the right coronary artery. Left ventricular pressure was also recorded with this catheter was pulled back. LIMA catheter was used to engage RIMA. This could not be selectively engaged. Catheter exchanges were performed over an exchange length guidewire. There were no immediate procedural complications. A TR band was used for radial hemostasis at the completion of the procedure.  The patient was transferred to the post catheterization recovery area for further monitoring.  Procedural Findings:  Hemodynamics: AO:  133/85   mmHg LV:  135/10    mmHg LVEDP: 23  mmHg  Coronary angiography: Coronary dominance: Right.    Left Main:  Normal in size with 30 % distal stenosis.  Left Anterior Descending (LAD):  Normal in size. Multiple stents are noted throughout the vessel. There is 50% ostial stenosis. There is diffuse 20% in-stent restenosis proximally. There is diffuse 50% in-stent restenosis in the mid and distal segment. The LAD is occluded distally.  1st diagonal (D1):  Very small in size.  2nd diagonal (D2):  Very small in size.  3rd diagonal (D3):  Very small in size.  Circumflex (LCx):  Small in size. There is 40-50% ostial stenosis.  1st obtuse marginal:  Very small in size.  2nd  obtuse marginal:  Normal in size with 50% ostial stenosis.  3rd obtuse marginal:  Normal in size with minor irregularities.   Ramus Intermedius:  Large in size. This bifurcates into 2 branches. The superior branch has a 70-80% ostial stenosis. The rest of the vessel has minor irregularities.  Right Coronary Artery: Normal in size with takeoff from the left coronary cusp. There is diffuse 20% proximal stenosis. There is a teacher 90% distal stenosis after the anastomosis with RIMA . There is a 60% distal stenosis at the distal RCA extending into the right PDA.  Posterior descending artery: Normal in size with 60% proximal stenosis.  Posterior AV segment: Normal in size with 30% ostial stenosis.  Posterolateral branchs:  Normal in size with minor irregularities.  RIMA to RCA: Patent. However, the insertion is proximal to the stenosis site.  Left ventriculography: Was not performed.   PCI note: RCA PCI was attempted. The patient was given Angiomax with therapeutic ACT. 600 mg of Plavix was given. I then tried to engage the right coronary artery but was not able to engage with any guiding catheter including AL 2, AL1, AL 0.75 and multipurpose guiding catheter. Thus, the procedure was aborted especially with his known chronic kidney disease in order to fully high contrast load.   Final Conclusions:   1. Significant 2 vessel coronary artery disease with moderate instent restenosis throughout the LAD. The distal LAD is known to be occluded. High-grade distal RCA stenosis after the RIMA anastomosis which is new compared to most recent cardiac catheterization. There is also diffuse distal disease in the RCA extending into the right PDA but this  does not seem to be different from most recent cardiac catheterization. 2. Mild to moderate elevation of ventricular end-diastolic pressure. 3. Unsuccessful RCA PCI due to inability to engage the vessel with a guiding catheter. 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  Recommendations:   RCA PCI via the femoral approach. Hydrate overnight given chronic kidney disease.  Kathlyn Sacramento MD, [facility] 12/02/2013, 6:28 PM

## 2013-12-02 NOTE — Progress Notes (Signed)
    Subjective:  No recurrence of chest pain or shortness of breath at rest. The patient states "I feel better since they started this IV medicine (heparin)."  Objective:  Vital Signs in the last 24 hours: Temp:  [98 F (36.7 C)-98.4 F (36.9 C)] 98.4 F (36.9 C) (03/25 0607) Pulse Rate:  [84] 84 (03/24 0907) Resp:  [16-18] 18 (03/25 0607) BP: (125-140)/(68-95) 134/81 mmHg (03/25 0607) SpO2:  [96 %-100 %] 96 % (03/25 0607) Weight:  [105.235 kg (232 lb)] 105.235 kg (232 lb) (03/24 1206)  Intake/Output from previous day:    Physical Exam: Pt is alert and oriented, pleasant obese gentleman in NAD HEENT: normal Neck: JVP - normal Lungs: CTA bilaterally CV: RRR without murmur or gallop Abd: soft, NT, Positive BS, obese Ext: no C/C/E, distal pulses intact and equal Skin: warm/dry no rash   Lab Results:  Recent Labs  12/01/13 0940 12/02/13 0433  WBC 6.9 9.1  HGB 12.8* 12.0*  PLT 221 218    Recent Labs  12/01/13 0940 12/02/13 0433  NA 137 139  K 5.2 5.0  CL 100 104  CO2 20 21  GLUCOSE 382* 293*  BUN 26* 24*  CREATININE 1.88* 1.98*    Recent Labs  12/01/13 1812 12/02/13 0118  TROPONINI <0.30 <0.30    Assessment/Plan:  1. Unstable angina pectoris. Cardiac catheterization and possible PCI plan today. Risks, benefits, and alternatives have been reviewed with the patient who understands and agrees to proceed. He will be continued on IV heparin. The patient is on aggressive antianginal regimen which includes amlodipine, carvedilol, isosorbide, and Ranexa. Further plans and disposition pending cardiac catheterization results. Note his graft anatomy includes a RIMA to RCA. 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  2. Chronic kidney disease. Creatinine is at baseline with clearance estimated at 43 ml/min. Medications reviewed and he is on no  nephrotoxic agents. Will give gentle hydration prior to catheterization.  3. Hypertension. Blood pressures reviewed and well controlled.  Disposition: Pending cardiac cath results this afternoon.  [name], M.D. 12/02/2013, 8:59 AM

## 2013-12-02 NOTE — Progress Notes (Signed)
Inpatient Diabetes Program Recommendations  AACE/ADA: New Consensus Statement on Inpatient Glycemic Control (2013)  Target Ranges:  Prepandial:   less than 140 mg/dL      Peak postprandial:   less than 180 mg/dL (1-2 hours)      Critically ill patients:  140 - 180 mg/dL   Reason for Visit: Results for Matthew Rowe, Matthew SR. (MRN [id number]) as of 12/02/2013 11:32  Ref. Range 12/01/2013 12:08 12/01/2013 16:48 12/01/2013 21:26 12/02/2013 07:55  Glucose-Capillary Latest Range: 70-99 mg/dL 301 (H) 283 (H) 234 (H) 206 (H)  Results for Matthew Rowe, Matthew SR. (MRN [id number]) as of 12/02/2013 11:32  Ref. Range 11/27/2013 05:43  Hemoglobin A1C Latest Range: <5.7 % 10.2 (H)   Diabetes history: Type 2 diabetes Outpatient Diabetes medications: Lantus 20-30 units bid Current orders for Inpatient glycemic control: Lantus 20 units bid, Novolog moderate tid with meals  Patient is NPO.  Once eating, patient will likely benefit from the addition of Novolog meal coverage 5 units tid with meals.  Adah Perl, RN, BC-ADM Inpatient Diabetes Coordinator Pager [phone]

## 2013-12-02 NOTE — H&P (View-Only) (Signed)
    Subjective:  No recurrence of chest pain or shortness of breath at rest. The patient states "I feel better since they started this IV medicine (heparin)."  Objective:  Vital Signs in the last 24 hours: Temp:  [98 F (36.7 C)-98.4 F (36.9 C)] 98.4 F (36.9 C) (03/25 0607) Pulse Rate:  [84] 84 (03/24 0907) Resp:  [16-18] 18 (03/25 0607) BP: (125-140)/(68-95) 134/81 mmHg (03/25 0607) SpO2:  [96 %-100 %] 96 % (03/25 0607) Weight:  [105.235 kg (232 lb)] 105.235 kg (232 lb) (03/24 1206)  Intake/Output from previous day:    Physical Exam: Pt is alert and oriented, pleasant obese gentleman in NAD HEENT: normal Neck: JVP - normal Lungs: CTA bilaterally CV: RRR without murmur or gallop Abd: soft, NT, Positive BS, obese Ext: no C/C/E, distal pulses intact and equal Skin: warm/dry no rash   Lab Results:  Recent Labs  12/01/13 0940 12/02/13 0433  WBC 6.9 9.1  HGB 12.8* 12.0*  PLT 221 218    Recent Labs  12/01/13 0940 12/02/13 0433  NA 137 139  K 5.2 5.0  CL 100 104  CO2 20 21  GLUCOSE 382* 293*  BUN 26* 24*  CREATININE 1.88* 1.98*    Recent Labs  12/01/13 1812 12/02/13 0118  TROPONINI <0.30 <0.30    Assessment/Plan:  1. Unstable angina pectoris. Cardiac catheterization and possible PCI plan today. Risks, benefits, and alternatives have been reviewed with the patient who understands and agrees to proceed. He will be continued on IV heparin. The patient is on aggressive antianginal regimen which includes amlodipine, carvedilol, isosorbide, and Ranexa. Further plans and disposition pending cardiac catheterization results. Note his graft anatomy includes a RIMA to RCA. 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  2. Chronic kidney disease. Creatinine is at baseline with clearance estimated at 43 ml/min. Medications reviewed and he is on no  nephrotoxic agents. Will give gentle hydration prior to catheterization.  3. Hypertension. Blood pressures reviewed and well controlled.  Disposition: Pending cardiac cath results this afternoon.  [name], M.D. 12/02/2013, 8:59 AM

## 2013-12-03 ENCOUNTER — Encounter (HOSPITAL_COMMUNITY)
Admission: EM | Disposition: A | Discharge status: Home / Self Care | Payer: Commercial Managed Care - HMO | Source: Home / Self Care | Attending: Cardiology

## 2013-12-03 DIAGNOSIS — I2510 Atherosclerotic heart disease of native coronary artery without angina pectoris: Principal | ICD-10-CM

## 2013-12-03 HISTORY — PX: PERCUTANEOUS CORONARY STENT INTERVENTION (PCI-S): SHX5485

## 2013-12-03 LAB — GLUCOSE, CAPILLARY
GLUCOSE-CAPILLARY: 176 mg/dL — AB (ref 70–99)
GLUCOSE-CAPILLARY: 153 mg/dL — AB (ref 70–99)
Glucose-Capillary: 127 mg/dL — ABNORMAL HIGH (ref 70–99)
Glucose-Capillary: 245 mg/dL — ABNORMAL HIGH (ref 70–99)

## 2013-12-03 LAB — CBC
HCT: 34.8 % — ABNORMAL LOW (ref 39.0–52.0)
HEMOGLOBIN: 12 g/dL — AB (ref 13.0–17.0)
MCH: 29.4 pg (ref 26.0–34.0)
MCHC: 34.5 g/dL (ref 30.0–36.0)
MCV: 85.3 fL (ref 78.0–100.0)
Platelets: 218 10*3/uL (ref 150–400)
RBC: 4.08 MIL/uL — ABNORMAL LOW (ref 4.22–5.81)
RDW: 13.1 % (ref 11.5–15.5)
WBC: 8.2 10*3/uL (ref 4.0–10.5)

## 2013-12-03 LAB — BASIC METABOLIC PANEL
BUN: 23 mg/dL (ref 6–23)
CHLORIDE: 105 meq/L (ref 96–112)
CO2: 21 mEq/L (ref 19–32)
Calcium: 9.3 mg/dL (ref 8.4–10.5)
Creatinine, Ser: 1.66 mg/dL — ABNORMAL HIGH (ref 0.50–1.35)
GFR calc non Af Amer: 46 mL/min — ABNORMAL LOW (ref >90)
GFR, EST AFRICAN AMERICAN: 53 mL/min — AB (ref >90)
Glucose, Bld: 208 mg/dL — ABNORMAL HIGH (ref 70–99)
POTASSIUM: 5.1 meq/L (ref 3.7–5.3)
SODIUM: 139 meq/L (ref 137–147)

## 2013-12-03 LAB — POCT ACTIVATED CLOTTING TIME: ACTIVATED CLOTTING TIME: 332 s

## 2013-12-03 SURGERY — PERCUTANEOUS CORONARY STENT INTERVENTION (PCI-S)
Anesthesia: LOCAL

## 2013-12-03 MED ORDER — NITROGLYCERIN 0.2 MG/ML ON CALL CATH LAB
INTRAVENOUS | Status: AC
  Filled 2013-12-03: qty 1

## 2013-12-03 MED ORDER — BIVALIRUDIN 250 MG IV SOLR
INTRAVENOUS | Status: AC
  Filled 2013-12-03: qty 250

## 2013-12-03 MED ORDER — SODIUM CHLORIDE 0.9 % IV SOLN
INTRAVENOUS | Status: AC
  Administered 2013-12-03: 15:00:00 100 mL/h via INTRAVENOUS

## 2013-12-03 MED ORDER — ONDANSETRON HCL 4 MG/2ML IJ SOLN
4.0000 mg | Freq: Four times a day (QID) | INTRAMUSCULAR | Status: DC | PRN
  Administered 2013-12-03: 4 mg via INTRAVENOUS

## 2013-12-03 MED ORDER — ASPIRIN 81 MG PO CHEW
81.0000 mg | CHEWABLE_TABLET | Freq: Every day | ORAL | Status: DC
  Administered 2013-12-04: 09:00:00 81 mg via ORAL
  Filled 2013-12-03: qty 1

## 2013-12-03 MED ORDER — INSULIN GLARGINE 100 UNIT/ML ~~LOC~~ SOLN: 10.0000 [IU] | Freq: Two times a day (BID) | SUBCUTANEOUS | Status: DC

## 2013-12-03 MED ORDER — CLOPIDOGREL BISULFATE 75 MG PO TABS: 75.0000 mg | ORAL_TABLET | Freq: Every day | ORAL | Status: DC

## 2013-12-03 MED ORDER — FENTANYL CITRATE 0.05 MG/ML IJ SOLN
INTRAMUSCULAR | Status: AC
  Filled 2013-12-03: qty 2

## 2013-12-03 MED ORDER — INSULIN GLARGINE 100 UNIT/ML ~~LOC~~ SOLN
20.0000 [IU] | Freq: Two times a day (BID) | SUBCUTANEOUS | Status: DC
  Administered 2013-12-03: 20 [IU] via SUBCUTANEOUS
  Administered 2013-12-03: 10 [IU] via SUBCUTANEOUS
  Administered 2013-12-04: 20 [IU] via SUBCUTANEOUS
  Filled 2013-12-03 (×5): qty 0.2

## 2013-12-03 MED ORDER — HYDRALAZINE HCL 20 MG/ML IJ SOLN
10.0000 mg | INTRAMUSCULAR | Status: DC | PRN
  Administered 2013-12-03: 16:00:00 10 mg via INTRAVENOUS
  Filled 2013-12-03: qty 1

## 2013-12-03 MED ORDER — INSULIN GLARGINE 100 UNIT/ML ~~LOC~~ SOLN
10.0000 [IU] | Freq: Once | SUBCUTANEOUS | Status: AC
  Filled 2013-12-03: qty 0.1

## 2013-12-03 MED ORDER — ACETAMINOPHEN 325 MG PO TABS: 650.0000 mg | ORAL_TABLET | ORAL | Status: DC | PRN

## 2013-12-03 MED ORDER — HEPARIN (PORCINE) IN NACL 2-0.9 UNIT/ML-% IJ SOLN
INTRAMUSCULAR | Status: AC
  Filled 2013-12-03: qty 1500

## 2013-12-03 MED ORDER — MIDAZOLAM HCL 2 MG/2ML IJ SOLN
INTRAMUSCULAR | Status: AC
  Filled 2013-12-03: qty 2

## 2013-12-03 MED ORDER — LIDOCAINE HCL (PF) 1 % IJ SOLN
INTRAMUSCULAR | Status: AC
  Filled 2013-12-03: qty 30

## 2013-12-03 MED ORDER — ONDANSETRON HCL 4 MG/2ML IJ SOLN
4.0000 mg | Freq: Four times a day (QID) | INTRAMUSCULAR | Status: DC | PRN
  Filled 2013-12-03: qty 2

## 2013-12-03 MED FILL — Sodium Chloride IV Soln 0.9%: INTRAVENOUS | Qty: 50 | Status: AC

## 2013-12-03 NOTE — Progress Notes (Signed)
Site area: right groin  Site Prior to Removal:  Level 0  Pressure Applied For 20 MINUTES    Minutes Beginning at 1630  Manual:   yes  Patient Status During Pull:  AAO X 4  Post Pull Groin Site:  Level 0  Post Pull Instructions Given:  yes  Post Pull Pulses Present:  yes  Dressing Applied:  yes  Comments: Tolerated procedure well

## 2013-12-03 NOTE — Interval H&P Note (Signed)
Cath Lab Visit (complete for each Cath Lab visit)  Clinical Evaluation Leading to the Procedure:   ACS: yes  Non-ACS:    Anginal Classification: CCS IV  Anti-ischemic medical therapy: Maximal Therapy (2 or more classes of medications)  Non-Invasive Test Results: No non-invasive testing performed  Prior CABG: Previous CABG      History and Physical Interval Note:  12/03/2013 12:46 PM  Matthew Kirks Sr.  has presented today for surgery, with the diagnosis of cad  The various methods of treatment have been discussed with the patient and family. After consideration of risks, benefits and other options for treatment, the patient has consented to  Procedure(s): PERCUTANEOUS CORONARY STENT INTERVENTION (PCI-S) (N/A) as a surgical intervention .  The patient's history has been reviewed, patient examined, no change in status, stable for surgery.  I have reviewed the patient's chart and labs.  Questions were answered to the patient's satisfaction.     Talyn Eddie S.

## 2013-12-03 NOTE — Progress Notes (Signed)
For PCI later today. Had chest pain last night but comfortable now. Need to check BMET.

## 2013-12-03 NOTE — CV Procedure (Signed)
    PROCEDURE:  Left heart catheterization with PCI RCA.  INDICATIONS:  Unstable angina  The risks, benefits, and details of the procedure were explained to the patient.  The patient verbalized understanding and wanted to proceed.  Informed written consent was obtained.  PROCEDURE TECHNIQUE:  After Xylocaine anesthesia a 40F sheath was placed in the right femoral artery with a single anterior needle wall stick.    IV Angiomax was given for anticoagulation. The patient had been preloaded with Plavix yesterday. Right coronary angiography was done using a CLS 3.5 guide catheter.  Left ventriculography was done using a CLS 4.0 catheter. Manual compression will be used for hemostasis.   CONTRAST:  Total of 165 cc.  COMPLICATIONS:  None.    HEMODYNAMICS:  Aortic pressure was 132/89; LV pressure was 133/4; LVEDP 26.  There was no gradient between the left ventricle and aorta.    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 The mid to distal vessel is diffusely diseased. There is a focal 95% stenosis in the mid vessel. The distal vessel also has a. 80% stenosis. There is diffuse disease.  PCI NARRATIVE: A CLS 3.5 guiding catheter was used to engage the RCA. A pro-water wire was placed into the RCA. A 2.0 x 15 balloon was used to predilate both the distal and the mid lesion. A 2.25 x 16 promise drug-eluting stent was placed in the more proximal lesion. This was inflated to 16 atmospheres. Post dilated with a 2.5 x 12 noncompliant balloon to 16 atmospheres. Further angiography showed the distal edge of the stent landed in a heavily diseased area.  We Decided to advance a 2.25 x 24 promise drug-eluting stent to the distal RCA. This was deployed in overlapping fashion to the trifurcation of the PDA and posterior lateral artery. The stent balloon was used to post dilate the entire stented segment, to greater than 2.5 mm in diameter. There is an excellent  angiographic result. There is still diffuse disease in the terminal vessels of the RCA system.  IMPRESSIONS:  1. Successful PCI of the mid to distal right coronary artery with overlapping promus drug-eluting stents, 2.25 x 24 and 2.25 x 16, postdilated to greater than 2.5 mm in diameter. 2.  LVEDP 26 mmHg. elevated LVEDP, likely related to aggressive hydration post catheterization.  Watch for post procedure shortness of breath again tomorrow since we will hydrate him aggressively after this dye load. 3.   AL2 or CLS 3.5 Guide is suitable for engaging the RCA.  CLS 3.5 was used today since we only had AL2 with sideholes in the cath lab.    RECOMMENDATION:  Continue dual antiplatelet therapy for at least a year. He'll need aggressive secondary prevention.  F/u with Dr. Aundra Dubin.

## 2013-12-03 NOTE — Progress Notes (Signed)
TR BAND REMOVAL  LOCATION:  right radial  DEFLATED PER PROTOCOL:  yes  TIME BAND OFF / DRESSING APPLIED: 2145   SITE UPON ARRIVAL:   Level 0  SITE AFTER BAND REMOVAL:  Level 0  REVERSE ALLEN'S TEST:    positive  CIRCULATION SENSATION AND MOVEMENT:  Within Normal Limits  yes  COMMENTS:    

## 2013-12-04 ENCOUNTER — Encounter (HOSPITAL_COMMUNITY): Payer: Self-pay | Admitting: Nurse Practitioner

## 2013-12-04 DIAGNOSIS — I200 Unstable angina: Secondary | ICD-10-CM

## 2013-12-04 LAB — CBC
HCT: 36.5 % — ABNORMAL LOW (ref 39.0–52.0)
HEMOGLOBIN: 12.3 g/dL — AB (ref 13.0–17.0)
MCH: 28.5 pg (ref 26.0–34.0)
MCHC: 33.7 g/dL (ref 30.0–36.0)
MCV: 84.5 fL (ref 78.0–100.0)
Platelets: 223 10*3/uL (ref 150–400)
RBC: 4.32 MIL/uL (ref 4.22–5.81)
RDW: 13.3 % (ref 11.5–15.5)
WBC: 9.9 10*3/uL (ref 4.0–10.5)

## 2013-12-04 LAB — BASIC METABOLIC PANEL
BUN: 20 mg/dL (ref 6–23)
CALCIUM: 9.7 mg/dL (ref 8.4–10.5)
CO2: 20 mEq/L (ref 19–32)
Chloride: 105 mEq/L (ref 96–112)
Creatinine, Ser: 1.8 mg/dL — ABNORMAL HIGH (ref 0.50–1.35)
GFR calc Af Amer: 48 mL/min — ABNORMAL LOW (ref >90)
GFR, EST NON AFRICAN AMERICAN: 41 mL/min — AB (ref >90)
Glucose, Bld: 116 mg/dL — ABNORMAL HIGH (ref 70–99)
Potassium: 4.8 mEq/L (ref 3.7–5.3)
SODIUM: 143 meq/L (ref 137–147)

## 2013-12-04 LAB — GLUCOSE, CAPILLARY: Glucose-Capillary: 151 mg/dL — ABNORMAL HIGH (ref 70–99)

## 2013-12-04 MED ORDER — ASPIRIN 81 MG PO TABS: 81.0000 mg | ORAL_TABLET | Freq: Every day | ORAL | Status: DC

## 2013-12-04 MED ORDER — CLOPIDOGREL BISULFATE 75 MG PO TABS: 75.0000 mg | ORAL_TABLET | Freq: Every day | ORAL | Status: DC

## 2013-12-04 MED ORDER — NITROGLYCERIN 0.4 MG SL SUBL
0.4000 mg | SUBLINGUAL_TABLET | SUBLINGUAL | Status: DC | PRN
Start: 2013-12-04 — End: 2014-09-19

## 2013-12-04 MED FILL — Sodium Chloride IV Soln 0.9%: INTRAVENOUS | Qty: 50 | Status: AC

## 2013-12-04 NOTE — Discharge Instructions (Signed)
**  PLEASE REMEMBER TO BRING ALL OF YOUR MEDICATIONS TO EACH OF YOUR FOLLOW-UP OFFICE VISITS. ° °NO HEAVY LIFTING OR SEXUAL ACTIVITY X 7 DAYS. °NO DRIVING X 3-5 DAYS. °NO SOAKING BATHS, HOT TUBS, POOLS, ETC., X 7 DAYS. ° °Groin Site Care °Refer to this sheet in the next few weeks. These instructions provide you with information on caring for yourself after your procedure. Your caregiver may also give you more specific instructions. Your treatment has been planned according to current medical practices, but problems sometimes occur. Call your caregiver if you have any problems or questions after your procedure. °HOME CARE INSTRUCTIONS °· You may shower 24 hours after the procedure. Remove the bandage (dressing) and gently wash the site with plain soap and water. Gently pat the site dry.  °· Do not apply powder or lotion to the site.  °· Do not sit in a bathtub, swimming pool, or whirlpool for 5 to 7 days.  °· No bending, squatting, or lifting anything over 10 pounds (4.5 kg) as directed by your caregiver.  °· Inspect the site at least twice daily.  ° °What to expect: °· Any bruising will usually fade within 1 to 2 weeks.  °· Blood that collects in the tissue (hematoma) may be painful to the touch. It should usually decrease in size and tenderness within 1 to 2 weeks.  °SEEK IMMEDIATE MEDICAL CARE IF: °· You have unusual pain at the groin site or down the affected leg.  °· You have redness, warmth, swelling, or pain at the groin site.  °· You have drainage (other than a small amount of blood on the dressing).  °· You have chills.  °· You have a fever or persistent symptoms for more than 72 hours.  °· You have a fever and your symptoms suddenly get worse.  °· Your leg becomes pale, cool, tingly, or numb.  °You have heavy bleeding from the site. Hold pressure on the site. . ° °

## 2013-12-04 NOTE — Discharge Summary (Signed)
Discharge Summary   Patient ID: Jaya Frana.,  MRN: [id number], DOB/AGE: [birth year] 53 y.o.  Admit date: 12/01/2013 Discharge date: 12/04/2013  Primary Care Provider: Lamar Blinks Primary Cardiologist: Einar Crow, MD   Discharge Diagnoses Principal Problem:   Unstable angina  **Status-post successful PCI and drug-eluting stent placement to the native RCA this admission.  Active Problems:   HTN (hypertension)   CKD (chronic kidney disease), stage III   Diabetes type 2, uncontrolled   Dyslipidemia   Polycystic kidney disease   OSA on CPAP   Obesity, unspecified  Allergies Allergies  Allergen Reactions  . Ciprocin-Fluocin-Procin [Fluocinolone Acetonide] Rash  . Clarithromycin Itching and Other (See Comments)    "Biaxin" Eyes itch and burn  . Cleocin [Clindamycin Hcl] Anaphylaxis and Swelling  . Glimepiride [Amaryl] Other (See Comments)    Elevates liver function    Procedures  Cardiac Catheterization and Percutaneous Coronary Intervention 3.25.2015 (Cath), 3.26.2015 (PCI)  Hemodynamics: AO:  133/85   mmHg LV:  135/10    mmHg LVEDP: 23  mmHg  Coronary angiography: Coronary dominance: Right.      Left Main:  Normal in size with 30 % distal stenosis.  Left Anterior Descending (LAD):  Normal in size. Multiple stents are noted throughout the vessel. There is 50% ostial stenosis. There is diffuse 20% in-stent restenosis proximally. There is diffuse 50% in-stent restenosis in the mid and distal segment. The LAD is occluded distally.  1st diagonal (D1):  Very small in size.  2nd diagonal (D2):  Very small in size.  3rd diagonal (D3):  Very small in size.  Circumflex (LCx):  Small in size. There is 40-50% ostial stenosis.  1st obtuse marginal:  Very small in size.  2nd obtuse marginal:  Normal in size with 50% ostial stenosis.  3rd obtuse marginal:  Normal in size with minor irregularities.      Ramus Intermedius:  Large in size. This bifurcates into 2  branches. The superior branch has a 70-80% ostial stenosis. The rest of the vessel has minor irregularities.  Right Coronary Artery: Normal in size with takeoff from the left coronary cusp. There is diffuse 20% proximal stenosis. There is a teacher 90% distal stenosis after the anastomosis with RIMA . There is a 60% distal stenosis at the distal RCA extending into the right PDA.    **Attempt was made to intervene upon the RCA on 12/02/2013, however the interventional team was unable to properly seat the guide catheter from the radial approach and therefor the case was aborted and the patient returned to the lab on 3.26.2015 and underwent successful PCI and DES to the distal RCA with placement of a 2.25 x 16 mm Promus DES more proximally and a 2.25 x 24 mm Promus DES more distally, via a femoral approach.   Posterior descending artery: Normal in size with 60% proximal stenosis.  Posterior AV segment: Normal in size with 30% ostial stenosis.  Posterolateral branchs:  Normal in size with minor irregularities.  RIMA to RCA: Patent. However, the insertion is proximal to the stenosis site.  Left ventriculography: Was not performed.   _____________   History of Present Illness  53 year old male with prior history of coronary artery disease status post prior LAD stenting and subsequent coronary artery bypass grafting x1 with placement of a RIMA->RCA he also has a history of hyperlipidemia, hypertension, and stage III kidney disease. He was recently hospitalized secondary to chest pain. During that hospitalization, patient ruled out for myocardial infarction and underwent stress  testing which showed a fixed apical defect as well as a partially reversible inferolateral wall defect with normal LV function. He was not felt to be an ideal candidate for cardiac catheterization given his renal insufficiency and at that time he was medically managed.   Unfortunately, he began to experience recurrence of sharp as  well as heavy chest pain and on March 24 was scheduled to followup in our office. Upon arrival, he developed recurrent chest pain and fell to the floor. He sustained no injuries. He was attended to by office staff and sent to GERD at EMS. There, ECG was nonacute and initial troponin was normal. He was admitted for further evaluation.   Hospital Course  Patient ruled out for myocardial infarction. After adequate hydration, decision was made to pursue diagnostic catheterization. This was performed on March 25 and revealed a 90% distal right coronary artery stenosis after the insertion of the RIMA graft. Anatomy was otherwise stable.  Attempts were made to intervene upon the distal right coronary artery however these were unsuccessful in the setting of a radial approach. Case was aborted and decision was made to bring him back to the cath lab on March 26 for PCI via a femoral approach. This was able to be carried out successfully on March 26, and 2 Promus drug-eluting stents were successfully placed in the distal RCA. Patient tolerated procedure well and post procedure his creatinine is relatively stable at 1.8. He has been ambulatine without symptoms or limitations and will be discharged home today in good condition. We have arranged for early followup of his basic metabolic panel for next week and office followup a week later.   Discharge Vitals Blood pressure 181/100, pulse 87, temperature 98.2 F (36.8 C), temperature source Oral, resp. rate 18, height 5\' 5"  (1.651 m), weight 233 lb 14.5 oz (106.1 kg), SpO2 95.00%.  Filed Weights   12/01/13 1206 12/02/13 2334 12/04/13 0013  Weight: 232 lb (105.235 kg) 233 lb 4 oz (105.8 kg) 233 lb 14.5 oz (106.1 kg)   Labs  CBC  Recent Labs  12/03/13 0430 12/04/13 0340  WBC 8.2 9.9  HGB 12.0* 12.3*  HCT 34.8* 36.5*  MCV 85.3 84.5  PLT 218 [id number]   Basic Metabolic Panel  Recent Labs  12/03/13 0745 12/04/13 0340  NA 139 143  K 5.1 4.8  CL 105 105  CO2  21 20  GLUCOSE 208* 116*  BUN 23 20  CREATININE 1.66* 1.80*  CALCIUM 9.3 9.7   Cardiac Enzymes  Recent Labs  12/01/13 1236 12/01/13 1812 12/02/13 0118  TROPONINI <0.30 <0.30 <0.30   Disposition  Pt is being discharged home today in good condition.  Follow-up Plans & Appointments      Follow-up Information   Follow up with [facility] On 12/08/2013. (blood chemistry - anytime between 8:30 am and 3:30 pm)    Contact information:   [place]      Follow up with Lamar Blinks, MD. (as scheduled.)    Specialty:  Family Medicine   Contact information:   [place] [id number] [phone]       Follow up with Murray Hodgkins, NP On 12/15/2013. (2:00)    Specialty:  Nurse Practitioner   Contact information:   [id number] [place] [id number] [phone]      Discharge Medications    Medication List         amLODipine 10 MG tablet  Commonly  known as:  NORVASC  Take 10 mg by mouth daily.     aspirin 81 MG tablet  Take 1 tablet (81 mg total) by mouth daily.     carvedilol 25 MG tablet  Commonly known as:  COREG  Take 25-50 mg by mouth See admin instructions. Take 50 mg in the morning with meals, then take 25 mg in the evening with meals.     clopidogrel 75 MG tablet  Commonly known as:  PLAVIX  Take 1 tablet (75 mg total) by mouth daily with breakfast.     diphenhydramine-acetaminophen 25-500 MG Tabs  Commonly known as:  TYLENOL PM  Take 2 tablets by mouth at bedtime.     gabapentin 300 MG capsule  Commonly known as:  NEURONTIN  Take 300 mg by mouth 3 (three) times daily.     Insulin Glargine 100 UNIT/ML Solostar Pen  Commonly known as:  LANTUS SOLOSTAR  Inject 20-30 Units into the skin 2 (two) times daily. Usually takes 20 units, if blood sugars are elevated can take up to 30 units     isosorbide mononitrate 30 MG 24 hr tablet  Commonly known as:  IMDUR  Take 90 mg by  mouth daily.     losartan 100 MG tablet  Commonly known as:  COZAAR  Take 100 mg by mouth daily.     nitroGLYCERIN 0.4 MG SL tablet  Commonly known as:  NITROSTAT  Place 1 tablet (0.4 mg total) under the tongue every 5 (five) minutes as needed for chest pain.     pantoprazole 40 MG tablet  Commonly known as:  PROTONIX  Take 1 tablet (40 mg total) by mouth 2 (two) times daily with a meal.     pravastatin 40 MG tablet  Commonly known as:  PRAVACHOL  Take 40 mg by mouth daily.     RANEXA 500 MG 12 hr tablet  Generic drug:  ranolazine  Take 500 mg by mouth every 6 (six) hours.     vitamin E 400 UNIT capsule  Take 1,200 Units by mouth daily.     zolpidem 10 MG tablet  Commonly known as:  AMBIEN  Take 10 mg by mouth at bedtime.       Outstanding Labs/Studies  BMET next week.  Duration of Discharge Encounter   Greater than 30 minutes including physician time.  Signed, Murray Hodgkins NP 12/04/2013, 11:54 AM

## 2013-12-04 NOTE — Progress Notes (Signed)
Patient seen and examined and history reviewed. Agree with above findings and plan. Doing well post PCI. BP is running high. Renal function is stable so we will resume cozaar. Stable for DC today.  Collier [facility] 12/04/2013 2:11 PM

## 2013-12-04 NOTE — Progress Notes (Signed)
CARDIAC REHAB PHASE I   PRE:  Rate/Rhythm: 94 SR    BP: sitting 152/101    SaO2:   MODE:  Ambulation: 300 ft   POST:  Rate/Rhythm: 97 SR    BP: sitting 181/100     SaO2:   Tolerated well, has been walking independently. BP elevated, no meds yet. Ed completed with pt and daughter. Pt with fairly flat affect regarding education. Would benefit from outpt DM classes. Pt sts he is interested in CRPII however he is not sure his insurance will cover. Will send referral.  [place], [place], ACSM 12/04/2013 8:57 AM

## 2013-12-04 NOTE — Plan of Care (Cosign Needed)
Problem: Undesirable Food Choices (NB-1.7) Goal: Nutrition education Formal process to instruct or train a patient/client in a skill or to impart knowledge to help patients/clients voluntarily manage or modify food choices and eating behavior to maintain or improve health. Outcome: Completed/Met Date Met:  12/04/13 Nutrition Education Note  Dietetic intern consulted for nutrition education regarding uncontrolled diabetes.  Dietetic intern provided "Carbohydrate Counting for People with Diabetes" and "Reading the Nutrition Label" from the Academy of Nutrition and Dietetics. Reviewed patient's dietary recall. Discussed what foods contain carbohydrate. Discouraged intake of sugar sweetened beverages and concentrated sweets. Encouraged patient to increase intake of fruits, vegetables, and whole grains. Encouraged patient to eat regular meals throughout the day. Explained how to find total carbohydrates on the nutrition facts label.   Dietetic intern discussed why it is important to adhere to diet recommendations and emphasized the role of carbohydrates on blood glucose control. Patient was receptive to diet education. Teach back method used.   Expect fair compliance.   Body mass index is 38.92 kg/(m^2). Patient meets criteria for obesity class II based on current BMI.     Lab Results  Component Value Date    HGBA1C 10.2* 11/27/2013   CBG (last 3)   Recent Labs   12/03/13 1712 12/03/13 2132 12/04/13 0748  GLUCAP 127* 245* 151*    Current diet order is CHO modified, patient is consuming approximately 100% of meals at this time. Labs and medications reviewed. No further nutrition interventions warranted at this time. RD contact information provided. If additional nutrition issues arise, please re-consult RD.   Claudell Kyle, Dietetic Intern Pager: [phone]

## 2013-12-04 NOTE — Discharge Summary (Signed)
Patient seen and examined and history reviewed. Agree with above findings and plan. See earlier rounding note.  Collier Salina JordanMD 12/04/2013 2:12 PM

## 2013-12-04 NOTE — Progress Notes (Signed)
Patient Name: Matthew HUDELSON Sr. Date of Encounter: 12/04/2013   Principal Problem:   Unstable angina Active Problems:   HTN (hypertension)   CKD (chronic kidney disease), stage III   Diabetes type 2, uncontrolled   Dyslipidemia   Polycystic kidney disease   OSA on CPAP   Obesity, unspecified   SUBJECTIVE  No c/p or sob overnight.  Groin feels good.  Ambulating w/o difficulty.  CURRENT MEDS . aspirin  81 mg Oral Daily  . carvedilol  25 mg Oral QAC supper  . clopidogrel  75 mg Oral Q breakfast  . gabapentin  300 mg Oral TID  . insulin aspart  0-15 Units Subcutaneous TID WC  . insulin glargine  20 Units Subcutaneous BID  . isosorbide mononitrate  90 mg Oral Daily  . pantoprazole  40 mg Oral BID WC  . ranolazine  500 mg Oral TID WC & HS  . simvastatin  20 mg Oral q1800   OBJECTIVE  Filed Vitals:   12/03/13 1830 12/03/13 1951 12/04/13 0013 12/04/13 0349  BP: 156/82 137/82 167/109 159/95  Pulse:  86 84 79  Temp:  98.3 F (36.8 C) 98.3 F (36.8 C) 98.3 F (36.8 C)  TempSrc:  Oral Oral Oral  Resp:  17 18 17   Height:      Weight:   233 lb 14.5 oz (106.1 kg)   SpO2:  97% 97% 98%    Intake/Output Summary (Last 24 hours) at 12/04/13 0727 Last data filed at 12/04/13 0222  Gross per 24 hour  Intake    875 ml  Output   2545 ml  Net  -1670 ml   Filed Weights   12/01/13 1206 12/02/13 2334 12/04/13 0013  Weight: 232 lb (105.235 kg) 233 lb 4 oz (105.8 kg) 233 lb 14.5 oz (106.1 kg)   PHYSICAL EXAM  General: Pleasant, NAD. Neuro: Alert and oriented X 3. Moves all extremities spontaneously. Psych: Normal affect. HEENT:  Normal  Neck: Supple without bruits or JVD. Lungs:  Resp regular and unlabored, CTA. Heart: RRR no s3, s4, or murmurs. Abdomen: Soft, non-tender, non-distended, BS + x 4.  Extremities: No clubbing, cyanosis or edema. DP/PT/Radials 2+ and equal bilaterally.  r groin w/o bleeding/bruit/hematoma.  Accessory Clinical Findings  CBC  Recent Labs  12/03/13 0430 12/04/13 0340  WBC 8.2 9.9  HGB 12.0* 12.3*  HCT 34.8* 36.5*  MCV 85.3 84.5  PLT 218 [id number]   Basic Metabolic Panel  Recent Labs  12/03/13 0745 12/04/13 0340  NA 139 143  K 5.1 4.8  CL 105 105  CO2 21 20  GLUCOSE 208* 116*  BUN 23 20  CREATININE 1.66* 1.80*  CALCIUM 9.3 9.7   Liver Function Tests  Recent Labs  12/01/13 0940  AST 47*  ALT 50  ALKPHOS 105  BILITOT 0.3  PROT 7.6  ALBUMIN 3.9   Cardiac Enzymes  Recent Labs  12/01/13 1236 12/01/13 1812 12/02/13 0118  TROPONINI <0.30 <0.30 <0.30   TELE  rsr  ECG  Rsr, 82, lad, prior ant/inf infarcts.  No acute st/t changes.  ASSESSMENT AND PLAN  1.  USA/CAD:  S/p successful PCI/DES to the native RCA yesterday.  No c/p or dyspnea overnight.  Groin/wrist stable.  Ambulating w/o difficulty.  Cont asa, statin, bb, plavix, ranexa, nitrate.  Cardiac rehab to see.  Plan d/c today.   2.  HTN:  Resume ARB.  3.  CKD III:  F/u bmet early next week.  ARB being resumed.  Creat stable.  4.  HL/HTG:  Cont statin.  Consider addition of fibrate (TG 605).  5.  Obesity:  Cardiac rehab.  6.  DM:  Cont lantus.  Signed, Murray Hodgkins NP

## 2013-12-04 NOTE — Progress Notes (Signed)
Spoke with patient and daughter re OP ed referral. Pt states he is in hopes to attend cardiac rehab, however he is concerned that his insurance will not cover enough. He is waiting to hear back. Ordered OP referral, but again not sure of financial ability with his insurance. Verified home phone # for [facility] to call. Reviewed discharge insulin plans. Thank you, Rosita Kea, RN, CNS, Diabetes Coordinator [phone])

## 2013-12-08 ENCOUNTER — Encounter: Payer: Medicare HMO | Attending: Cardiovascular Disease | Admitting: *Deleted

## 2013-12-08 ENCOUNTER — Ambulatory Visit: Payer: Medicare PPO | Admitting: *Deleted

## 2013-12-08 ENCOUNTER — Encounter: Payer: Self-pay | Admitting: *Deleted

## 2013-12-08 VITALS — Ht 65.0 in | Wt 240.7 lb

## 2013-12-08 DIAGNOSIS — E119 Type 2 diabetes mellitus without complications: Secondary | ICD-10-CM | POA: Insufficient documentation

## 2013-12-08 DIAGNOSIS — E1165 Type 2 diabetes mellitus with hyperglycemia: Secondary | ICD-10-CM

## 2013-12-08 DIAGNOSIS — Z713 Dietary counseling and surveillance: Secondary | ICD-10-CM | POA: Insufficient documentation

## 2013-12-08 DIAGNOSIS — IMO0002 Reserved for concepts with insufficient information to code with codable children: Secondary | ICD-10-CM

## 2013-12-08 NOTE — Patient Instructions (Signed)
Plan:  Aim for 3 Carb Choices per meal (45 grams) +/- 1 either way  Aim for 0-15 Carbs per snack if hungry  Consider reading food labels for Total Carbohydrate and Fat Grams of foods Consider  increasing your activity level by walking for 30 minutes daily as tolerated Consider checking BG at alternate times per day as directed by MD  Consider taking medication  as directed by MD  Take skin off chicken Try not to fry foods Try [employer] Shark butter substitute Consider [employer]

## 2013-12-08 NOTE — Progress Notes (Signed)
Appt start time: 1530 end time: 1700.  Assessment: Matthew Rowe was seen on  12/08/13 for individual diabetes education. He states he has not had any formal diabetes education. He presents alone following a recent hospitalization for a cardiac event. He had two stents placed. He is pain free at this time. He lives with his wife. He does not have advanced directives in place, blue information sheet given. [name] believes that he was diagnosed in approximately 2000 and placed on insulin 1 year ago. He sees an endocrinologist and a nephrologist for specialty care. He has been advised to follow up with Dr. Janett Billow Copland in a week since hospitalization.  Current HbA1c: 03/13/13 10.9%  Preferred Learning Style:   No preference indicated   Learning Readiness:   Change in progress  MEDICATIONS: Lantus glucose < 200mg /dl 18-20units, glucose > 200 25 units, glucose > 300 35units   Usual physical activity: Walks 30 minutes daily    Intervention:  Nutrition counseling provided.  Discussed diabetes disease process and treatment options.  Discussed physiology of diabetes and role of obesity on insulin resistance.  Encouraged moderate weight reduction to improve glucose levels.  Discussed role of medications and diet in glucose control  Provided education on macronutrients on glucose levels.  Provided education on carb counting, importance of regularly scheduled meals/snacks, and meal planning  Discussed effects of physical activity on glucose levels and long-term glucose control.  Recommended 150 minutes of physical activity/week.  Reviewed patient medications.  Discussed role of medication on blood glucose and possible side effects  Discussed blood glucose monitoring and interpretation.  Discussed recommended target ranges and individual ranges.    Described short-term complications: hyper- and hypo-glycemia.  Discussed causes,symptoms, and treatment options.  Discussed prevention, detection, and  treatment of long-term complications.  Discussed the role of prolonged elevated glucose levels on body systems.  Discussed role of stress on blood glucose levels and discussed strategies to manage psychosocial issues.  Discussed recommendations for long-term diabetes self-care.  Established checklist for medical, dental, and emotional self-care.  Plan:  Aim for 3 Carb Choices per meal (45 grams) +/- 1 either way  Aim for 0-15 Carbs per snack if hungry  Consider reading food labels for Total Carbohydrate and Fat Grams of foods  Continue your activity level by walking for 30 minutes daily as tolerated  Continue checking BG at alternate times per day as directed by MD  Continue taking medication as directed by MD   Take skin off chicken  Try not to fry foods  Try [employer] Shark butter substitute  Consider [facility] Method Utilized:  S[employer] Hands on  Handouts given during visit include: Living Well with Diabetes Carb Counting  Meal Plan Card Snack sheet My Plate  Barriers to learning/adherence to lifestyle change: cardiac health  Diabetes self-care support plan:   [facility] support group  Demonstrated degree of understanding via:  Teach Back   Monitoring/Evaluation:  Dietary intake, exercise, test glucose, and body weight, Return for follow up prn.

## 2013-12-15 ENCOUNTER — Encounter: Payer: Self-pay | Admitting: Nurse Practitioner

## 2013-12-15 ENCOUNTER — Ambulatory Visit (INDEPENDENT_AMBULATORY_CARE_PROVIDER_SITE_OTHER): Payer: Commercial Managed Care - HMO | Admitting: Nurse Practitioner

## 2013-12-15 VITALS — BP 132/84 | HR 81 | Ht 65.0 in | Wt 239.4 lb

## 2013-12-15 DIAGNOSIS — E785 Hyperlipidemia, unspecified: Secondary | ICD-10-CM

## 2013-12-15 DIAGNOSIS — I209 Angina pectoris, unspecified: Secondary | ICD-10-CM

## 2013-12-15 DIAGNOSIS — I200 Unstable angina: Secondary | ICD-10-CM

## 2013-12-15 DIAGNOSIS — I10 Essential (primary) hypertension: Secondary | ICD-10-CM

## 2013-12-15 DIAGNOSIS — N183 Chronic kidney disease, stage 3 unspecified: Secondary | ICD-10-CM

## 2013-12-15 DIAGNOSIS — I25810 Atherosclerosis of coronary artery bypass graft(s) without angina pectoris: Secondary | ICD-10-CM

## 2013-12-15 DIAGNOSIS — R079 Chest pain, unspecified: Secondary | ICD-10-CM

## 2013-12-15 LAB — BASIC METABOLIC PANEL
BUN: 24 mg/dL — ABNORMAL HIGH (ref 6–23)
CO2: 22 mEq/L (ref 19–32)
Calcium: 9.2 mg/dL (ref 8.4–10.5)
Chloride: 105 mEq/L (ref 96–112)
Creatinine, Ser: 2 mg/dL — ABNORMAL HIGH (ref 0.4–1.5)
GFR: 44.24 mL/min — ABNORMAL LOW (ref >60.00)
GLUCOSE: 244 mg/dL — AB (ref 70–99)
POTASSIUM: 4.3 meq/L (ref 3.5–5.1)
Sodium: 135 mEq/L (ref 135–145)

## 2013-12-15 NOTE — Patient Instructions (Signed)
Your physician recommends that you continue on your current medications as directed. Please refer to the Current Medication list given to you today. Your physician recommends that you schedule a follow-up appointment in: [place] DR. Aundra Dubin

## 2013-12-15 NOTE — Progress Notes (Signed)
Patient Name: Matthew CHONKO Sr. Date of Encounter: 12/15/2013  Primary Care Provider:  Lamar Blinks, MD Primary Cardiologist:  Einar Crow, MD   Patient Profile  53 y/o male with h/o CAD s/p recent RCA intervention who presents for f/u.  Problem List   Past Medical History  Diagnosis Date  . Chest pain     Chronic  . Dyslipidemia   . Coronary artery disease     a. INF MI 1999: PCI of RCA;  b.  extensive stenting of LAD;  c. s/p CABG with RIMA->RCA in 2006;  d. Stable, Low risk MV 05/2012; e. 09/2012 Cath: stable anatomy->med Rx. f. Abnl nuc 11/2013 -> med rx; g. 11/2013 Cath/PCI: LM 30d, LAD 50ost, 20/50isr, 100d, LCX 40-50ost, OM2 50ost, RI 70-80ost sup branch, RCA 20p, 90/60d(2.25x17 & 2.25x24 Promus DES'), PDA 60p, RIMA->RCA ok.  . Hypertension   . Hx of echocardiogram     a. Echo 3/12:  mild LVH, EF 55-60%, trivial AI  . DM2 (diabetes mellitus, type 2)   . History of blood transfusion ?2011  . GERD (gastroesophageal reflux disease)     "once; came to hospital" (09/15/2012)  . OSA on CPAP   . CKD (chronic kidney disease)   . Polycystic kidney disease    Past Surgical History  Procedure Laterality Date  . Cystectomy  1990's    off face  . Coronary angioplasty with stent placement  1999 / 2002 / 2004 / 2006?  Marland Kitchen Cardiac catheterization  2012  . Coronary artery bypass graft  2006    CABG X?; in [place]  . Cardiac catheterization  2013  . Cardiac catheterization  2014    LHC (1/14): total occlusion of distal LAD, diffuse disease in ramus, 70% proximal LCx, 50% proximal and mid RCA, 50% distal RCA, patent RIMA-RCA.    Allergies  Allergies  Allergen Reactions  . Ciprocin-Fluocin-Procin [Fluocinolone Acetonide] Rash  . Clarithromycin Itching and Other (See Comments)    "Biaxin" Eyes itch and burn  . Cleocin [Clindamycin Hcl] Anaphylaxis and Swelling  . Glimepiride [Amaryl] Other (See Comments)    Elevates liver function     HPI  53 year old male with prior  history of coronary artery disease status post prior LAD stenting and subsequent coronary artery bypass grafting x1 with placement of a RIMA->RCA he also has a history of hyperlipidemia, hypertension, and stage III kidney disease. He was recently hospitalized secondary to chest pain. During that hospitalization, patient ruled out for myocardial infarction and underwent stress testing which showed a fixed apical defect as well as a partially reversible inferolateral wall defect with normal LV function. He was not felt to be an ideal candidate for cardiac catheterization given his renal insufficiency and at that time he was medically managed. Unfortunately, he began to experience recurrence of sharp as well as heavy chest pain and on March 24 was scheduled to followup in our office. Upon arrival, he developed recurrent chest pain and fell to the floor. He sustained no injuries. He was attended to by office staff and sent to the [facility] ER via EMS. There, ECG was nonacute and initial troponin was normal. He was admitted for further evaluation. He r/o for MI and was hydrated in preparation for cath, which was performed on 3/25, revealing a 90% distal RCA stenosis after the insertion of the RIMA graft.  PCI was attempted via the radial approach but was subsequently aborted secondary to a lack of guide catheter support.  Pt was hydrated and taken  back to the lab on 3/26, for PCI via a femoral approach. This was able to be successfully carried out and 2 promus DES' were placed in the distal RCA.  He tolerated procedure well and creat was stable and he was d/c'd on 3/27.  Since d/c, he has done well w/o recurrent chest pain or dyspnea.  He says that he is feeling markedly better and is very pleased.  He was supposed to have a bmet last week but never did.  He's been walking some each day w/o limitations.  He denies chest pain, palpitations, dyspnea, pnd, orthopnea, n, v, dizziness, syncope, edema, weight gain, or early  satiety.   Home Medications  Prior to Admission medications   Medication Sig Start Date End Date Taking? Authorizing Provider  amLODipine (NORVASC) 10 MG tablet Take 10 mg by mouth daily.   Yes Historical Provider, MD  aspirin 81 MG tablet Take 1 tablet (81 mg total) by mouth daily. 12/04/13  Yes Rogelia Mire, NP  carvedilol (COREG) 25 MG tablet Take 25-50 mg by mouth See admin instructions. Take 50 mg in the morning with meals, then take 25 mg in the evening with meals.   Yes Historical Provider, MD  clopidogrel (PLAVIX) 75 MG tablet Take 1 tablet (75 mg total) by mouth daily with breakfast. 12/04/13  Yes Rogelia Mire, NP  diphenhydramine-acetaminophen (TYLENOL PM) 25-500 MG TABS Take 2 tablets by mouth at bedtime.   Yes Historical Provider, MD  gabapentin (NEURONTIN) 300 MG capsule Take 300 mg by mouth 3 (three) times daily.   Yes Historical Provider, MD  hydrochlorothiazide (HYDRODIURIL) 25 MG tablet Take 25 mg by mouth daily.  12/07/13  Yes Historical Provider, MD  Insulin Glargine (LANTUS SOLOSTAR) 100 UNIT/ML Solostar Pen Inject 20-30 Units into the skin 2 (two) times daily. Usually takes 20 units, if blood sugars are elevated can take up to 30 units 09/18/13  Yes Gay Filler Copland, MD  isosorbide mononitrate (IMDUR) 30 MG 24 hr tablet Take 90 mg by mouth daily.   Yes Historical Provider, MD  losartan (COZAAR) 100 MG tablet Take 100 mg by mouth daily.   Yes Historical Provider, MD  nitroGLYCERIN (NITROSTAT) 0.4 MG SL tablet Place 1 tablet (0.4 mg total) under the tongue every 5 (five) minutes as needed for chest pain. 12/04/13  Yes Rogelia Mire, NP  pantoprazole (PROTONIX) 40 MG tablet Take 1 tablet (40 mg total) by mouth 2 (two) times daily with a meal. 09/30/13 09/30/14 Yes Jessica C Copland, MD  pravastatin (PRAVACHOL) 40 MG tablet Take 40 mg by mouth daily.   Yes Historical Provider, MD  ranolazine (RANEXA) 500 MG 12 hr tablet Take 500 mg by mouth every 6 (six) hours.   Yes  Historical Provider, MD  vitamin E 400 UNIT capsule Take 1,200 Units by mouth daily.   Yes Historical Provider, MD  zolpidem (AMBIEN) 10 MG tablet Take 10 mg by mouth at bedtime.   Yes Historical Provider, MD    Review of Systems  As above, doing well.  He denies chest pain, palpitations, dyspnea, pnd, orthopnea, n, v, dizziness, syncope, edema, weight gain, or early satiety.  All other systems reviewed and are otherwise negative except as noted above.  Physical Exam  Blood pressure 132/84, pulse 81, height 5\' 5"  (1.651 m), weight 239 lb 6.4 oz (108.591 kg).  General: Pleasant, NAD Psych: Normal affect. Neuro: Alert and oriented X 3. Moves all extremities spontaneously. HEENT: Normal  Neck: Supple without bruits  or JVD. Lungs:  Resp regular and unlabored, CTA. Heart: RRR no s3, s4, or murmurs. Abdomen: Soft, non-tender, non-distended, BS + x 4.  Extremities: No clubbing, cyanosis or edema. DP/PT/Radials 2+ and equal bilaterally.  R groin cath site w/o bleeding/bruit/hematoma.  Accessory Clinical Findings  ECG - rsr, 81, LAD, inf, antlat infarcts.  Assessment & Plan  1.  CAD s/p CABG:  S/p recent PCI/DES x 2 to the native RCA distal to the RIMA anastomosis.  He has been doing really well since d/c from the hospital w/o recurrent c/p or dyspnea.  He remains on asa, plavix, bb, nitrate, arb, and ranexa.  2.  HTN:  Stable.  3.  HL:  Cont statin.  4.  CKD III:  F/u bmet today. Creat was 1.8 @ discharge.  5.  DM II: on lantus.  Per IM.  6.  Morbid Obesity:  He plans to steadily increase his activity.  7.  Dispo:  F/u Dr. Aundra Dubin in 3 mos or sooner if necessary.  Murray Hodgkins, NP 12/15/2013, 2:24 PM

## 2013-12-17 ENCOUNTER — Other Ambulatory Visit: Payer: Self-pay

## 2013-12-18 ENCOUNTER — Telehealth: Payer: Self-pay | Admitting: Cardiology

## 2013-12-18 DIAGNOSIS — I25810 Atherosclerosis of coronary artery bypass graft(s) without angina pectoris: Secondary | ICD-10-CM

## 2013-12-18 DIAGNOSIS — N183 Chronic kidney disease, stage 3 unspecified: Secondary | ICD-10-CM

## 2013-12-18 NOTE — Telephone Encounter (Signed)
New message      Pt is returning a nurses call

## 2013-12-18 NOTE — Telephone Encounter (Signed)
Spoke with patient about recent lab results 

## 2013-12-25 ENCOUNTER — Ambulatory Visit (INDEPENDENT_AMBULATORY_CARE_PROVIDER_SITE_OTHER): Payer: Medicare HMO | Admitting: Family Medicine

## 2013-12-25 VITALS — BP 134/84 | HR 77 | Temp 98.4°F | Resp 18 | Ht 65.0 in | Wt 239.4 lb

## 2013-12-25 DIAGNOSIS — E1165 Type 2 diabetes mellitus with hyperglycemia: Secondary | ICD-10-CM

## 2013-12-25 DIAGNOSIS — IMO0002 Reserved for concepts with insufficient information to code with codable children: Secondary | ICD-10-CM

## 2013-12-25 DIAGNOSIS — G63 Polyneuropathy in diseases classified elsewhere: Secondary | ICD-10-CM

## 2013-12-25 DIAGNOSIS — G589 Mononeuropathy, unspecified: Secondary | ICD-10-CM

## 2013-12-25 DIAGNOSIS — K219 Gastro-esophageal reflux disease without esophagitis: Secondary | ICD-10-CM

## 2013-12-25 DIAGNOSIS — I2510 Atherosclerotic heart disease of native coronary artery without angina pectoris: Secondary | ICD-10-CM

## 2013-12-25 DIAGNOSIS — E118 Type 2 diabetes mellitus with unspecified complications: Secondary | ICD-10-CM

## 2013-12-25 DIAGNOSIS — G4700 Insomnia, unspecified: Secondary | ICD-10-CM

## 2013-12-25 DIAGNOSIS — E349 Endocrine disorder, unspecified: Secondary | ICD-10-CM

## 2013-12-25 MED ORDER — ZOLPIDEM TARTRATE 10 MG PO TABS: 10.0000 mg | ORAL_TABLET | Freq: Every day | ORAL | Status: DC

## 2013-12-25 MED ORDER — PANTOPRAZOLE SODIUM 40 MG PO TBEC: 40.0000 mg | DELAYED_RELEASE_TABLET | Freq: Two times a day (BID) | ORAL | Status: DC

## 2013-12-25 MED ORDER — CLOPIDOGREL BISULFATE 75 MG PO TABS: 75.0000 mg | ORAL_TABLET | Freq: Every day | ORAL | Status: DC

## 2013-12-25 MED ORDER — INSULIN GLARGINE 100 UNIT/ML SOLOSTAR PEN: 20.0000 [IU] | PEN_INJECTOR | Freq: Two times a day (BID) | SUBCUTANEOUS | Status: DC

## 2013-12-25 MED ORDER — GABAPENTIN 300 MG PO CAPS: 300.0000 mg | ORAL_CAPSULE | Freq: Three times a day (TID) | ORAL | Status: DC

## 2013-12-25 NOTE — Patient Instructions (Addendum)
[facility]- might be a good place for you to do the [employer] program  [address], [place], [place] [address]  [phone]  We are glad you are doing better!    We are going to refer you to endocrinology for your diabetes.  In the meantime please be sure to check your fasting glucose in the morning and write it down.  Work on your portion sizes like you learned from the nutritionist.    Wt Readings from Last 3 Encounters:  12/25/13 239 lb 6.4 oz (108.591 kg)  12/15/13 239 lb 6.4 oz (108.591 kg)  12/08/13 240 lb 11.2 oz (109.181 kg)   Be cautious with ambien use- I would recommend that you try a 1/2 tablet as needed, and do not combine with alcohol or any other sedating medications

## 2013-12-25 NOTE — Progress Notes (Signed)
[facility] [address], [place] [address] [phone]  Date:  12/25/2013   Name:  Matthew Rowe   DOB:  1960/10/14   MRN:  [id number]  PCP:  Lamar Blinks, MD    Chief Complaint: Follow-up and Medication Refill   History of Present Illness:  Matthew Rowe. is a 53 y.o. very pleasant male patient who presents with the following:  Here today for hospital follow-up.   He was admitted from 3/24 to 3/27 with unstable angina and received a stent to the native RCA.   He also has HTN, CKD, uncontrolled DM, dyslipidemia, OSA and obesity.    We have had a lot of trouble managing his diabetes; he is currently on lantus (no metfomrin due to CKD).  Dr. Bjorn Pippin is his nephrologist.  Last seen by myself in January at which point his A1c was 8.5%.  His A1c was checked in March and was 10.2%  He reports he is doing well, he has not had "much" pain since he was released.  He hopes the spot in his heart that was causing his pain has been reached and resolved.    He had his cath on the right this time, and is having soreness in the left leg as well.    Most recent creat from 10 days ago at 2.0.   He did check into the silver sneakers program, and plans to start this soon.  He has started walking for exercise and most days is able to do this without CP.  He did have CP the first time he tried to have sex, but he tried again and did ok.    Cr clearance is approx 40 per most recent creatinine  Patient Active Problem List   Diagnosis Date Noted  . Unstable angina 12/01/2013  . Chest pain 11/27/2013  . Financial difficulty 03/13/2013  . Medically noncompliant 03/13/2013  . Diabetes type 2, uncontrolled 03/13/2013  . Obesity, unspecified 11/27/2012  . Angina pectoris 09/16/2012  . CKD (chronic kidney disease), stage III 09/15/2012  . Polycystic kidney disease 09/15/2012  . OSA on CPAP 09/15/2012  . HTN (hypertension) 03/27/2011  . Type 2 diabetes mellitus  12/21/2010  . Dyslipidemia     Past Medical History  Diagnosis Date  . Chest pain     Chronic  . Dyslipidemia   . Coronary artery disease     a. INF MI 1999: PCI of RCA;  b.  extensive stenting of LAD;  c. s/p CABG with RIMA->RCA in 2006;  d. Stable, Low risk MV 05/2012; e. 09/2012 Cath: stable anatomy->med Rx. f. Abnl nuc 11/2013 -> med rx; g. 11/2013 Cath/PCI: LM 30d, LAD 50ost, 20/50isr, 100d, LCX 40-50ost, OM2 50ost, RI 70-80ost sup branch, RCA 20p, 90/60d(2.25x17 & 2.25x24 Promus DES'), PDA 60p, RIMA->RCA ok.  . Hypertension   . Hx of echocardiogram     a. Echo 3/12:  mild LVH, EF 55-60%, trivial AI  . DM2 (diabetes mellitus, type 2)   . History of blood transfusion ?2011  . GERD (gastroesophageal reflux disease)     "once; came to hospital" (09/15/2012)  . OSA on CPAP   . CKD (chronic kidney disease)   . Polycystic kidney disease     Past Surgical History  Procedure Laterality Date  . Cystectomy  1990's    off face  . Coronary angioplasty with stent placement  1999 / 2002 / 2004 / 2006?  Marland Kitchen Cardiac catheterization  2012  .  Coronary artery bypass graft  2006    CABG X?; in [place]  . Cardiac catheterization  2013  . Cardiac catheterization  2014    LHC (1/14): total occlusion of distal LAD, diffuse disease in ramus, 70% proximal LCx, 50% proximal and mid RCA, 50% distal RCA, patent RIMA-RCA.    History  Substance Use Topics  . Smoking status: Never Smoker   . Smokeless tobacco: Never Used     Comment: smoked some as a teenager (high school)  . Alcohol Use: No     Comment: 09/15/2012 "drank a little when I was young"    Family History  Problem Relation Age of Onset  . Cancer Mother     Lung  . Anemia Mother   . Kidney disease Mother     Polycystic  . Diabetes Father   . Heart attack Father 46    Died suddenly  . Heart failure Father   . Hyperlipidemia Father   . Hypertension Father   . Kidney failure Sister 5    Died  . Heart attack Sister   . Hypertension  Sister   . Diabetes Sister   . Diabetes Brother   . Kidney disease Brother     Polcystic kidney disease  . Diabetes Sister     Allergies  Allergen Reactions  . Ciprocin-Fluocin-Procin [Fluocinolone Acetonide] Rash  . Clarithromycin Itching and Other (See Comments)    "Biaxin" Eyes itch and burn  . Cleocin [Clindamycin Hcl] Anaphylaxis and Swelling  . Glimepiride [Amaryl] Other (See Comments)    Elevates liver function     Medication list has been reviewed and updated.  Current Outpatient Prescriptions on File Prior to Visit  Medication Sig Dispense Refill  . amLODipine (NORVASC) 10 MG tablet Take 10 mg by mouth daily.      Marland Kitchen aspirin 81 MG tablet Take 1 tablet (81 mg total) by mouth daily.      . carvedilol (COREG) 25 MG tablet Take 25-50 mg by mouth See admin instructions. Take 50 mg in the morning with meals, then take 25 mg in the evening with meals.      . clopidogrel (PLAVIX) 75 MG tablet Take 1 tablet (75 mg total) by mouth daily with breakfast.  30 tablet  6  . diphenhydramine-acetaminophen (TYLENOL PM) 25-500 MG TABS Take 2 tablets by mouth at bedtime.      . gabapentin (NEURONTIN) 300 MG capsule Take 300 mg by mouth 3 (three) times daily.      . hydrochlorothiazide (HYDRODIURIL) 25 MG tablet Take 25 mg by mouth daily.       . Insulin Glargine (LANTUS SOLOSTAR) 100 UNIT/ML Solostar Pen Inject 20-30 Units into the skin 2 (two) times daily. Usually takes 20 units, if blood sugars are elevated can take up to 30 units  15 pen  0  . isosorbide mononitrate (IMDUR) 30 MG 24 hr tablet Take 90 mg by mouth daily.      Marland Kitchen losartan (COZAAR) 100 MG tablet Take 100 mg by mouth daily.      . nitroGLYCERIN (NITROSTAT) 0.4 MG SL tablet Place 1 tablet (0.4 mg total) under the tongue every 5 (five) minutes as needed for chest pain.  25 tablet  3  . pantoprazole (PROTONIX) 40 MG tablet Take 1 tablet (40 mg total) by mouth 2 (two) times daily with a meal.  180 tablet  3  . pravastatin (PRAVACHOL)  40 MG tablet Take 40 mg by mouth daily.      Marland Kitchen  ranolazine (RANEXA) 500 MG 12 hr tablet Take 500 mg by mouth every 6 (six) hours.      . vitamin E 400 UNIT capsule Take 1,200 Units by mouth daily.      Marland Kitchen zolpidem (AMBIEN) 10 MG tablet Take 10 mg by mouth at bedtime.       No current facility-administered medications on file prior to visit.    Review of Systems:  As per HPI- otherwise negative.   Physical Examination: Filed Vitals:   12/25/13 1107  BP: 134/84  Pulse: 77  Temp: 98.4 F (36.9 C)  Resp: 18   Filed Vitals:   12/25/13 1107  Height: 5\' 5"  (1.651 m)  Weight: 239 lb 6.4 oz (108.591 kg)   Body mass index is 39.84 kg/(m^2). Ideal Body Weight: Weight in (lb) to have BMI = 25: 149.9  GEN: WDWN, NAD, Non-toxic, A & O x 3, obese, looks well HEENT: Atraumatic, Normocephalic. Neck supple. No masses, No LAD. Ears and Nose: No external deformity. CV: RRR, No M/G/R. No JVD. No thrill. No extra heart sounds. PULM: CTA B, no wheezes, crackles, rhonchi. No retractions. No resp. distress. No accessory muscle use. ABD: S, NT, ND. No rebound. No HSM. EXTR: No c/c/e NEURO Normal gait.  PSYCH: Normally interactive. Conversant. Not depressed or anxious appearing.  Calm demeanor.    Assessment and Plan: CAD (coronary atherosclerotic disease) - Plan: clopidogrel (PLAVIX) 75 MG tablet  Neuropathy associated with endocrine disorder - Plan: gabapentin (NEURONTIN) 300 MG capsule  GERD (gastroesophageal reflux disease) - Plan: pantoprazole (PROTONIX) 40 MG tablet  Insomnia - Plan: zolpidem (AMBIEN) 10 MG tablet  Type II or unspecified type diabetes mellitus with unspecified complication, uncontrolled - Plan: Insulin Glargine (LANTUS SOLOSTAR) 100 UNIT/ML Solostar Pen, Ambulatory referral to Endocrinology   He is doing well post cath and PCI.  Notes improvement in his chest pain; he is able to be more active with less pain.  Needs refills of some of his medications sent to his mail  away pharmacy today; did these for him.  Discussed his diabetes; we continue to have trouble keeping him under control.  Medication options are more limited due to his kidney disease.  Given his significant vascular disease will refer him to endocrinology to help [place] manage this problem.  For now he will continue his lantus, and asked him to keep a diary of his daily glucose readings    Signed Lamar Blinks, MD

## 2013-12-31 ENCOUNTER — Telehealth: Payer: Self-pay

## 2013-12-31 NOTE — Telephone Encounter (Signed)
Patient called to get samples of ranexa 500 mg placed samples at front desk

## 2014-01-08 ENCOUNTER — Encounter: Payer: Self-pay | Admitting: *Deleted

## 2014-01-12 ENCOUNTER — Other Ambulatory Visit (INDEPENDENT_AMBULATORY_CARE_PROVIDER_SITE_OTHER): Payer: Commercial Managed Care - HMO

## 2014-01-12 DIAGNOSIS — I25810 Atherosclerosis of coronary artery bypass graft(s) without angina pectoris: Secondary | ICD-10-CM

## 2014-01-12 DIAGNOSIS — N183 Chronic kidney disease, stage 3 unspecified: Secondary | ICD-10-CM

## 2014-01-12 LAB — BASIC METABOLIC PANEL
BUN: 28 mg/dL — ABNORMAL HIGH (ref 6–23)
CALCIUM: 9.5 mg/dL (ref 8.4–10.5)
CO2: 22 meq/L (ref 19–32)
CREATININE: 2.2 mg/dL — AB (ref 0.4–1.5)
Chloride: 111 mEq/L (ref 96–112)
GFR: 40.52 mL/min — AB (ref >60.00)
Glucose, Bld: 210 mg/dL — ABNORMAL HIGH (ref 70–99)
Potassium: 4.7 mEq/L (ref 3.5–5.1)
SODIUM: 141 meq/L (ref 135–145)

## 2014-01-14 ENCOUNTER — Other Ambulatory Visit: Payer: Self-pay | Admitting: *Deleted

## 2014-02-02 ENCOUNTER — Ambulatory Visit: Payer: Commercial Managed Care - HMO | Admitting: Endocrinology

## 2014-02-04 ENCOUNTER — Telehealth: Payer: Self-pay | Admitting: *Deleted

## 2014-02-04 NOTE — Telephone Encounter (Signed)
Ranexa samples provided for patient.

## 2014-02-08 ENCOUNTER — Telehealth: Payer: Self-pay | Admitting: *Deleted

## 2014-02-08 NOTE — Telephone Encounter (Signed)
PA for ranexa faxed to [facility]

## 2014-02-09 NOTE — Telephone Encounter (Signed)
Ranexa approved through [facility] until 09/09/2014.

## 2014-03-11 ENCOUNTER — Telehealth: Payer: Self-pay

## 2014-03-11 ENCOUNTER — Ambulatory Visit (INDEPENDENT_AMBULATORY_CARE_PROVIDER_SITE_OTHER): Payer: Medicare HMO | Admitting: Family Medicine

## 2014-03-11 VITALS — BP 157/91 | HR 78 | Temp 97.7°F | Resp 18 | Ht 66.0 in | Wt 237.4 lb

## 2014-03-11 DIAGNOSIS — E1165 Type 2 diabetes mellitus with hyperglycemia: Secondary | ICD-10-CM

## 2014-03-11 DIAGNOSIS — E118 Type 2 diabetes mellitus with unspecified complications: Principal | ICD-10-CM

## 2014-03-11 DIAGNOSIS — IMO0002 Reserved for concepts with insufficient information to code with codable children: Secondary | ICD-10-CM

## 2014-03-11 LAB — POCT GLYCOSYLATED HEMOGLOBIN (HGB A1C): Hemoglobin A1C: 9.5

## 2014-03-11 NOTE — Telephone Encounter (Signed)
Pt called and states that a fax for a RX for some kind of tier 2 medication from [facility] has been sent here for Dr Lorelei Pont and he needs it filled asap. He can be reached [phone]. Thank you

## 2014-03-11 NOTE — Telephone Encounter (Signed)
Dr Lorelei Pont, I have not seen anything faxed for pt. I see that he is here to see you for OV, can you please see if you can find out what he needs?

## 2014-03-11 NOTE — Patient Instructions (Addendum)
Your A1c does look a little better.  I will get you referred back to endocrinology to work on your diabetes further.  If you are not able to get your lantus tier reduction form taken care of prior to your trip I would purchase just 1 pen

## 2014-03-11 NOTE — Progress Notes (Signed)
[facility] [address], [place] [address] [phone]  Date:  03/11/2014   Name:  Matthew Rowe   DOB:  11/10/1960   MRN:  [id number]  PCP:  Lamar Blinks, MD    Chief Complaint: Diabetes   History of Present Illness:  Matthew Rowe. is a 53 y.o. very pleasant male patient who presents with the following:  Here today for a follow-up. I last saw him a couple of months ago.  History of obesity, CAD, DM, HTN, CKD.  He had a cardiac stent in March of this year.  He is on lantus for his DM- I did refer him to endocrinology but he did not go due to a higher co-pay.  He states that lantus is "tier 3" on his [employer] insurance plan-apparently [employer] faxed a tier reduction form to me today.   He is nearly out of insulin and is concerned about how he will afford this  Last A1c was 10.2- we will check today Glucose at home runs is 165- 280.   Wt Readings from Last 3 Encounters:  03/11/14 237 lb 6.4 oz (107.684 kg)  12/25/13 239 lb 6.4 oz (108.591 kg)  12/15/13 239 lb 6.4 oz (108.591 kg)     Patient Active Problem List   Diagnosis Date Noted  . Unstable angina 12/01/2013  . Chest pain 11/27/2013  . Financial difficulty 03/13/2013  . Medically noncompliant 03/13/2013  . Diabetes type 2, uncontrolled 03/13/2013  . Obesity, unspecified 11/27/2012  . Angina pectoris 09/16/2012  . CKD (chronic kidney disease), stage III 09/15/2012  . Polycystic kidney disease 09/15/2012  . OSA on CPAP 09/15/2012  . HTN (hypertension) 03/27/2011  . Type 2 diabetes mellitus 12/21/2010  . Dyslipidemia     Past Medical History  Diagnosis Date  . Chest pain     Chronic  . Dyslipidemia   . Coronary artery disease     a. INF MI 1999: PCI of RCA;  b.  extensive stenting of LAD;  c. s/p CABG with RIMA->RCA in 2006;  d. Stable, Low risk MV 05/2012; e. 09/2012 Cath: stable anatomy->med Rx. f. Abnl nuc 11/2013 -> med rx; g. 11/2013 Cath/PCI: LM 30d, LAD 50ost, 20/50isr,  100d, LCX 40-50ost, OM2 50ost, RI 70-80ost sup branch, RCA 20p, 90/60d(2.25x17 & 2.25x24 Promus DES'), PDA 60p, RIMA->RCA ok.  . Hypertension   . Hx of echocardiogram     a. Echo 3/12:  mild LVH, EF 55-60%, trivial AI  . DM2 (diabetes mellitus, type 2)   . History of blood transfusion ?2011  . GERD (gastroesophageal reflux disease)     "once; came to hospital" (09/15/2012)  . OSA on CPAP   . CKD (chronic kidney disease)   . Polycystic kidney disease     Past Surgical History  Procedure Laterality Date  . Cystectomy  1990's    off face  . Coronary angioplasty with stent placement  1999 / 2002 / 2004 / 2006?  Marland Kitchen Cardiac catheterization  2012  . Coronary artery bypass graft  2006    CABG X?; in [place]  . Cardiac catheterization  2013  . Cardiac catheterization  2014    LHC (1/14): total occlusion of distal LAD, diffuse disease in ramus, 70% proximal LCx, 50% proximal and mid RCA, 50% distal RCA, patent RIMA-RCA.    History  Substance Use Topics  . Smoking status: Never Smoker   . Smokeless tobacco: Never Used     Comment: smoked some  as a teenager (high school)  . Alcohol Use: No     Comment: 09/15/2012 "drank a little when I was young"    Family History  Problem Relation Age of Onset  . Cancer Mother     Lung  . Anemia Mother   . Kidney disease Mother     Polycystic  . Diabetes Father   . Heart attack Father 13    Died suddenly  . Heart failure Father   . Hyperlipidemia Father   . Hypertension Father   . Kidney failure Sister 5    Died  . Heart attack Sister   . Hypertension Sister   . Diabetes Sister   . Diabetes Brother   . Kidney disease Brother     Polcystic kidney disease  . Diabetes Sister     Allergies  Allergen Reactions  . Ciprocin-Fluocin-Procin [Fluocinolone Acetonide] Rash  . Clarithromycin Itching and Other (See Comments)    "Biaxin" Eyes itch and burn  . Cleocin [Clindamycin Hcl] Anaphylaxis and Swelling  . Glimepiride [Amaryl] Other (See  Comments)    Elevates liver function     Medication list has been reviewed and updated.  Current Outpatient Prescriptions on File Prior to Visit  Medication Sig Dispense Refill  . amLODipine (NORVASC) 10 MG tablet Take 10 mg by mouth daily.      Marland Kitchen aspirin 81 MG tablet Take 1 tablet (81 mg total) by mouth daily.      . carvedilol (COREG) 25 MG tablet Take 25-50 mg by mouth See admin instructions. Take 50 mg in the morning with meals, then take 25 mg in the evening with meals.      . clopidogrel (PLAVIX) 75 MG tablet Take 1 tablet (75 mg total) by mouth daily with breakfast.  90 tablet  3  . diphenhydramine-acetaminophen (TYLENOL PM) 25-500 MG TABS Take 2 tablets by mouth at bedtime.      . gabapentin (NEURONTIN) 300 MG capsule Take 1 capsule (300 mg total) by mouth 3 (three) times daily.  270 capsule  3  . Insulin Glargine (LANTUS SOLOSTAR) 100 UNIT/ML Solostar Pen Inject 20-30 Units into the skin 2 (two) times daily. Usually takes 20 units, if blood sugars are elevated can take up to 30 units  5 pen  6  . isosorbide mononitrate (IMDUR) 30 MG 24 hr tablet Take 90 mg by mouth daily.      Marland Kitchen losartan (COZAAR) 100 MG tablet Take 100 mg by mouth daily.      . nitroGLYCERIN (NITROSTAT) 0.4 MG SL tablet Place 1 tablet (0.4 mg total) under the tongue every 5 (five) minutes as needed for chest pain.  25 tablet  3  . pantoprazole (PROTONIX) 40 MG tablet Take 1 tablet (40 mg total) by mouth 2 (two) times daily with a meal.  180 tablet  3  . pravastatin (PRAVACHOL) 40 MG tablet Take 40 mg by mouth daily.      . ranolazine (RANEXA) 500 MG 12 hr tablet Take 500 mg by mouth every 6 (six) hours.      . vitamin E 400 UNIT capsule Take 1,200 Units by mouth daily.      Marland Kitchen zolpidem (AMBIEN) 10 MG tablet Take 1 tablet (10 mg total) by mouth at bedtime.  90 tablet  1   No current facility-administered medications on file prior to visit.    Review of Systems:  As per HPI- otherwise negative.   Physical  Examination: Filed Vitals:   03/11/14 1625  BP: 157/91  Pulse: 78  Temp: 97.7 F (36.5 C)  Resp: 18   Filed Vitals:   03/11/14 1625  Height: 5\' 6"  (1.676 m)  Weight: 237 lb 6.4 oz (107.684 kg)   Body mass index is 38.34 kg/(m^2). Ideal Body Weight: Weight in (lb) to have BMI = 25: 154.6  GEN: WDWN, NAD, Non-toxic, A & O x 3, obese. Looks well HEENT: Atraumatic, Normocephalic. Neck supple. No masses, No LAD. Ears and Nose: No external deformity. CV: RRR, No M/G/R. No JVD. No thrill. No extra heart sounds. PULM: CTA B, no wheezes, crackles, rhonchi. No retractions. No resp. distress. No accessory muscle use. EXTR: No c/c/e NEURO Normal gait.  PSYCH: Normally interactive. Conversant. Not depressed or anxious appearing.  Calm demeanor.   Results for orders placed in visit on 03/11/14  POCT GLYCOSYLATED HEMOGLOBIN (HGB A1C)      Result Value Ref Range   Hemoglobin A1C 9.5     His A1c is a bit better today  Assessment and Plan: Type II or unspecified type diabetes mellitus with unspecified complication, uncontrolled - Plan: POCT glycosylated hemoglobin (Hb A1C), Ambulatory referral to Endocrinology  Mliss Fritz called [employer] and they will cover his endocrinology referral so will do this for him again.  Also completed paperwork explaining why he needs to be on lantus. We hope this will help [place] toward our goal of getting his DM under better control   Signed Lamar Blinks, MD

## 2014-03-12 NOTE — Telephone Encounter (Signed)
Pt asked for a Tier reduction PA to be done so that he can get his Lantus Solostar for lower cost. Completed form and faxed to [facility]. Tier Reduction was denied d/t there not being any lower tiered alternatives on plan. I have given the denial to Dr Lorelei Pont.

## 2014-03-12 NOTE — Telephone Encounter (Signed)
Tried to reach the [employer] several times today and finally got a call back from the [place] who advised they would call and give the info to the Medicare plans Appeals and have them call me back. I did not receive a call back today. Per Dr Lorelei Pont, called pt and gave status update and suggested that he go to pharm and buy 1 Lantus pen. Pt agreed and will also call [employer] on Monday. Dr Lorelei Pont, Juluis Rainier

## 2014-03-15 NOTE — Telephone Encounter (Signed)
[employer] CB and I put them through to Dr Copland at 104.

## 2014-03-15 NOTE — Telephone Encounter (Signed)
I spoke with the [facility] representative.  They are still not able to approve Matthew Rowe insulin.  Apparently I will need to do a peer to peer.  This is fine, I gave my cell phone number for the MD to call me.  I was assured that I would be called without 72 hours.

## 2014-03-17 NOTE — Telephone Encounter (Signed)
Dr Lorelei Pont had spoken to [facility] and was told the Appeal was denied, but today I received an approval of appeal, coverage good through 09/09/14. Called pharm who reported pt's price still $168.53. [facility] and after many transfers and 45 mins was advised that Tier Exception WAS approved, ref # [id number], BUT it will not apply because pt is in the coverage gap (donut hole) in his drug coverage for the year. Dr Lorelei Pont advised her plan would be to get pt into Endo and they might be able to help w/samples or another plan/treatment. Called pt per her inst and

## 2014-03-17 NOTE — Telephone Encounter (Signed)
Advised pt of status and he agreed to referral to Endo, but stated it will have to be next week on Thurs or Friday d/t out of town funeral. Matthew Rowe this info and she will contact Endo office. Routing this to Dr Coralie Carpen, and Butch Penny.

## 2014-03-26 ENCOUNTER — Telehealth: Payer: Self-pay

## 2014-03-26 ENCOUNTER — Ambulatory Visit: Payer: Medicare HMO | Admitting: Endocrinology

## 2014-03-26 NOTE — Telephone Encounter (Signed)
Patient 's wife called for samples of ranexa placed samples up front

## 2014-03-27 ENCOUNTER — Other Ambulatory Visit: Payer: Self-pay | Admitting: Physician Assistant

## 2014-04-02 ENCOUNTER — Telehealth: Payer: Self-pay

## 2014-04-02 ENCOUNTER — Encounter: Payer: Self-pay | Admitting: *Deleted

## 2014-04-02 NOTE — Telephone Encounter (Signed)
LM advising pt we do not carry samples of the insulin pens. He may check the web site of the medication and be able to find coupons.

## 2014-04-02 NOTE — Telephone Encounter (Signed)
PATIENT IS CALLING TO SEE IF HE CAN GET SAMPLES FOR A PARTICULAR PIN BECAUSE HIS INSURANCE WON'T COVER IT. PLEASE CALL PATIENT FOR BETTER DETAIL!!

## 2014-04-07 ENCOUNTER — Telehealth: Payer: Self-pay | Admitting: Family Medicine

## 2014-04-07 NOTE — Telephone Encounter (Signed)
Spoke to pt, Matthew Rowe states Matthew Rowe needs 2 pens, Matthew Rowe has called the manufacturer and was told that Matthew Rowe can order samplers for him.  I spoke with Matthew copland and she stated she does go onto the webstie specifically for him, to order this medication, however it will take 2-3 weeks for the order to arrive and she can only order so many samples every so many months, which will not be enough to cover his need. Also, she does not pre-order these samples, and does not know when Matthew Rowe will need them unless Matthew Rowe lets her know.   I spoke with the patient and made him aware that Matthew. [name] specifically orders this medication for him. i explaind that we could not continue to do this as she is only allowed to order a specific amount of samples every so many months. Matthew Rowe expressed an understanding and stated Matthew Rowe was just in a bind this month.  Matthew Rowe wanted to know when Matthew Rowe should call to check on his medication, i explained to him we would call when the samples arrived.

## 2014-04-07 NOTE — Telephone Encounter (Signed)
I have gone to the [employer] side and requested samples; will let him know when they arrive

## 2014-04-08 ENCOUNTER — Ambulatory Visit (INDEPENDENT_AMBULATORY_CARE_PROVIDER_SITE_OTHER): Payer: Commercial Managed Care - HMO | Admitting: Cardiology

## 2014-04-08 ENCOUNTER — Encounter: Payer: Self-pay | Admitting: Cardiology

## 2014-04-08 VITALS — BP 130/82 | HR 85 | Ht 65.5 in | Wt 235.4 lb

## 2014-04-08 DIAGNOSIS — E785 Hyperlipidemia, unspecified: Secondary | ICD-10-CM | POA: Diagnosis not present

## 2014-04-08 DIAGNOSIS — N183 Chronic kidney disease, stage 3 unspecified: Secondary | ICD-10-CM | POA: Diagnosis not present

## 2014-04-08 DIAGNOSIS — Q613 Polycystic kidney, unspecified: Secondary | ICD-10-CM | POA: Diagnosis not present

## 2014-04-08 DIAGNOSIS — I10 Essential (primary) hypertension: Secondary | ICD-10-CM

## 2014-04-08 DIAGNOSIS — I25810 Atherosclerosis of coronary artery bypass graft(s) without angina pectoris: Secondary | ICD-10-CM

## 2014-04-08 MED ORDER — HYDROCHLOROTHIAZIDE 25 MG PO TABS: ORAL_TABLET | ORAL | Status: DC

## 2014-04-08 NOTE — Progress Notes (Signed)
Patient ID: Matthew Rowe., male   DOB: 1961-04-29, 53 y.o.   MRN: [id number] PCP: Dr. Lorelei Pont  53 yo with history CAD s/p CABG (RIMA to RCA, anomalous RCA off left cusp), occlusion of the distal LAD, and chronic angina presents for cardiology followup.  Patient has had chronic angina for years. In 1/14, he developed progressive angina and was hospitalized.  LHC was done, showing unchanged anatomy.  RIMA-RCA was patent.  He did well after this on ranolazine.  In 4/14, he ran out of ranolazine and the chest pain returned. He was admitted overnight in 4/14 and ruled out for MI.  In 7/14, he had again run low on ranolazine and developed more chest pain.  He was admitted again and ruled out for MI.  In 3/15, he was admitted with recurrent chest pain.  LHC was done, showing 90% distal RCA stenosis after RIMA insertion.  He had Promus DES x 2 to distal RCA.    Since last admission, he has been doing well.  He has had no chest pain except one episode where he "walked a long distance."  No significant exertional dyspnea.  No orthopnea/PND.  No lightheadedness or palpitations.    Labs (7/13): K 4.9, creatinine 1.75, LDL 63 Labs (1/14): K 4.2, creatinine 1.65, TGs 483 (could not calculate LDL) Labs (4/14): K 4.8, creatinine 1.62, HCT 36.8 Labs (7/14): K 4.3, creatinine 1.63, LDL 56, HDL 34, TGs 382 Labs (1/15): K 4.6, creatinine 2 Labs (3/15): TGs 605, LDL unable to calculate Labs (5/15): K 4.7, creatinine 2.2  PMH: 1. CAD: CABG 2006 at [facility] with [place].  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  Patient has had prior extensive PCI to LAD with occlusion of the distal LAD.  LHC (7/12): proximal LAD stent, extensive mid-distal LAD stenting, 60-70% mid LAD instent restenosis, subtotal distal LAD instent restenosis, LCFx without significant disease, anomalous RCA off left cusp with mild disease, patent RIMA-RCA.  Echo (3/12): EF 55-60%, trivial AI, mild LVH.  Lexiscan myoview  (9/13) with small apical scar, moderate inferior scar from base to apex with some reversibility, EF 47% with apical hypokinesis (similar to prior study).  Chronic angina.  LHC (1/14): total occlusion of distal LAD, diffuse disease in ramus, 70% proximal LCx, 50% proximal and mid RCA, 50% distal RCA, patent RIMA-RCA.   LHC (3/15) with multiple stents in the LAD and 50% ostial LAD stenosis, total occlusion distal LAD, 40-50% ostial LCx stenosis, 90% distal RCA stenosis after RIMA insertion.  Patient had DES x 2 to distal RCA.  2. OSA: CPAP. 3. Type II diabetes 4. Hyperlipidemia: had side effects from Lipitor.  5. HTN 6. CKD: polycystic kidney disease.  7. Ischemic cardiomyopathy: EF 55-60% by echo in 3/12, EF 47% with apical hypokinesis in 9/13.   SH: Nonsmoker.  Married, lives in [place].  On disability.   FH: Multiple family members with MIs.   ROS: All systems reviewed and negative except as per HPI.   Current Outpatient Prescriptions  Medication Sig Dispense Refill  . amLODipine (NORVASC) 10 MG tablet Take 10 mg by mouth daily.      Marland Kitchen aspirin 81 MG tablet Take 1 tablet (81 mg total) by mouth daily.      . carvedilol (COREG) 25 MG tablet Take 25-50 mg by mouth See admin instructions. Take 50 mg in the morning with meals, then take 25 mg in the evening with meals.      Marland Kitchen  clopidogrel (PLAVIX) 75 MG tablet Take 1 tablet (75 mg total) by mouth daily with breakfast.  90 tablet  3  . diphenhydramine-acetaminophen (TYLENOL PM) 25-500 MG TABS Take 2 tablets by mouth at bedtime.      . gabapentin (NEURONTIN) 300 MG capsule Take 1 capsule (300 mg total) by mouth 3 (three) times daily.  270 capsule  3  . hydrochlorothiazide (HYDRODIURIL) 25 MG tablet TAKE 1 TABLET EVERY DAY AS NEEDED  FOR  BLOOD  PRESSURE  90 tablet  3  . Insulin Glargine (LANTUS SOLOSTAR) 100 UNIT/ML Solostar Pen Inject 20-30 Units into the skin 2 (two) times daily. Usually takes 20 units, if blood sugars are elevated can take up to  30 units  5 pen  6  . isosorbide mononitrate (IMDUR) 30 MG 24 hr tablet Take 90 mg by mouth daily.      Marland Kitchen losartan (COZAAR) 100 MG tablet Take 100 mg by mouth daily.      . nitroGLYCERIN (NITROSTAT) 0.4 MG SL tablet Place 1 tablet (0.4 mg total) under the tongue every 5 (five) minutes as needed for chest pain.  25 tablet  3  . pantoprazole (PROTONIX) 40 MG tablet Take 1 tablet (40 mg total) by mouth 2 (two) times daily with a meal.  180 tablet  3  . pravastatin (PRAVACHOL) 40 MG tablet Take 40 mg by mouth daily.      . ranolazine (RANEXA) 500 MG 12 hr tablet Take 500 mg by mouth every 6 (six) hours.      . vitamin E 400 UNIT capsule Take 1,200 Units by mouth daily.      Marland Kitchen zolpidem (AMBIEN) 10 MG tablet Take 1 tablet (10 mg total) by mouth at bedtime.  90 tablet  1   No current facility-administered medications for this visit.    BP 130/82  Pulse 85  Ht 5' 5.5" (1.664 m)  Wt 235 lb 6.4 oz (106.777 kg)  BMI 38.56 kg/m2 General: NAD, obese.  Neck: No JVD, no thyromegaly or thyroid nodule.  Lungs: Clear to auscultation bilaterally with normal respiratory effort. CV: Nondisplaced PMI.  Heart regular S1/S2, no S3/S4, no murmur.  No peripheral edema.  No carotid bruit.  Normal pedal pulses.  Abdomen: Soft, nontender, no hepatosplenomegaly, no distention.  Neurologic: Alert and oriented x 3.  Psych: Normal affect. Extremities: No clubbing or cyanosis.   Assessment/Plan: 1. CAD:  Angina much improved since 3/15 dRCA PCI.  Only 1 episode of chest pain that he can remember.   - Continue ASA 81 and Plavix long-term. - Continue statin, amlodipine, ranolazine, and Coreg.  2. HTN: Controlled. 3. Hyperlipidemia: Triglycerides very high in 3/15.  Will recheck today, if remain very high, will start fenofibrate.  4. CKD: Patient has APKD.  Creatinine seems to be running higher, up to 2.2 when last checked. He has a nephrologist.   [name] 04/08/2014

## 2014-04-08 NOTE — Patient Instructions (Signed)
Your physician recommends that you schedule a follow-up appointment in: 3 months with Dr Aundra Dubin  Your physician recommends that you return for lab today: lipids/bmp

## 2014-04-09 LAB — LDL CHOLESTEROL, DIRECT: LDL DIRECT: 50.6 mg/dL

## 2014-04-09 LAB — BASIC METABOLIC PANEL
BUN: 25 mg/dL — AB (ref 6–23)
CALCIUM: 9.2 mg/dL (ref 8.4–10.5)
CO2: 22 mEq/L (ref 19–32)
Chloride: 104 mEq/L (ref 96–112)
Creatinine, Ser: 2.2 mg/dL — ABNORMAL HIGH (ref 0.4–1.5)
GFR: 40.06 mL/min — AB (ref >60.00)
GLUCOSE: 430 mg/dL — AB (ref 70–99)
Potassium: 4.4 mEq/L (ref 3.5–5.1)
SODIUM: 134 meq/L — AB (ref 135–145)

## 2014-04-09 LAB — LIPID PANEL
Cholesterol: 212 mg/dL — ABNORMAL HIGH (ref 0–200)
HDL: 31.6 mg/dL — AB (ref >39.00)
NonHDL: 180.4
Total CHOL/HDL Ratio: 7
Triglycerides: 983 mg/dL — ABNORMAL HIGH (ref 0.0–149.0)
VLDL: 196.6 mg/dL — ABNORMAL HIGH (ref 0.0–40.0)

## 2014-04-12 ENCOUNTER — Telehealth: Payer: Self-pay | Admitting: *Deleted

## 2014-04-12 DIAGNOSIS — E781 Pure hyperglyceridemia: Secondary | ICD-10-CM

## 2014-04-12 MED ORDER — FENOFIBRATE 145 MG PO TABS: 145.0000 mg | ORAL_TABLET | Freq: Every day | ORAL | Status: DC

## 2014-04-12 NOTE — Telephone Encounter (Signed)
Notes Recorded by Larey Dresser, MD on 04/10/2014 at 1:53 PM Very high triglycerides, risk for pancreatitis. Needs fenofibrate (generic) 145 mg daily. Repeat lipids 2 months. Creatinine elevated but stable.  04/12/14 Pt advised, verbalized understanding.

## 2014-04-19 ENCOUNTER — Ambulatory Visit: Payer: Medicare HMO | Admitting: Endocrinology

## 2014-04-19 ENCOUNTER — Encounter: Payer: Self-pay | Admitting: *Deleted

## 2014-04-19 DIAGNOSIS — Z0289 Encounter for other administrative examinations: Secondary | ICD-10-CM

## 2014-04-27 ENCOUNTER — Telehealth: Payer: Self-pay

## 2014-04-27 DIAGNOSIS — E1165 Type 2 diabetes mellitus with hyperglycemia: Secondary | ICD-10-CM

## 2014-04-27 DIAGNOSIS — E118 Type 2 diabetes mellitus with unspecified complications: Principal | ICD-10-CM

## 2014-04-27 DIAGNOSIS — IMO0002 Reserved for concepts with insufficient information to code with codable children: Secondary | ICD-10-CM

## 2014-04-27 NOTE — Telephone Encounter (Signed)
Dr. Lorelei Pont    Patient called to check on the samples of lantus.   [phone]

## 2014-04-28 NOTE — Telephone Encounter (Signed)
Mickel Baas, Environmental education officer, had spoken w/pt and asked me to look into status of samples. I called [employer] and was told that they received the order for samples but it was put on "AOP" hold d/t them having some missing info of some type from a previous shipment. Rep stated that she will have to get in touch with our area Tree surgeon to have them fax [place] whatever info or signature is needed and after he receives that back, they will be able to release the samples. We have not, to my knowledge, received any notice that the samples were put on hold or that missing info or add'l signature was needed. I asked the Rep to have the Tree surgeon fax what is needed today so that we can get it back to him as this is an urgent situation, pt has no insulin. She agreed and gave me case # of [id number], and phone [phone], opt 7,5 to check on this if needed. I also spoke w/the Pt Asst Program and was told that pt may now be eligible to receive free Lantus for the remainder of the year since by this time of the year, he has probably met the $627.60 expenditure toward Medicare that was required of him before he can receive the ass't. She will send the application back through to be processed again which takes 2-3 days and we will be notified.  I called pt and gave him all info above. He was understanding and hopeful that he will be able to get the ass't for the rest of the year. I have asked Med Recs to watch for the fax from [place] and bring it to me ASAP, and will check back w/[employer] on Friday if haven't heard about the Pt Asst.

## 2014-04-30 NOTE — Telephone Encounter (Signed)
I checked through all faxes and asked [employer], and we have not received any fax from [place] yet to get the hold on the samples resolved. Called [employer] and was advised a message was sent to the Tree surgeon in our area, and the rep I spoke to advised that he will get in touch w/ the sales office to check on this situation and will call me back as soon as he hears something. [employer] Biochemist, clinical was at [facility] and he was able to have me sign for the past samples so that new samples can now be mailed. Called Customer Service back and they advised that we will need to go back in to website [email or web address] and reorder samples on Monday.  Dr Lorelei Pont, sales rep was talking about the new insulin [employer] which is equivalent to Lantus (dosage same as pt was on w/Lantus - usually not much adjustment is needed). He has a savings cared that would allow pt to pay $10 w/no ins and $15 copay w/ins. I also was able to have Dr Carlota Raspberry sign for a sample for you which I put in the refrigerator labelled w/your name and pt's. If you want to change pt to this new insulin, there is a sample here, if you want him to remain on Lantus, we can order new samples Monday. I updated Mickel Baas, Environmental education officer, on status.

## 2014-04-30 NOTE — Telephone Encounter (Signed)
Called Matthew Rowe and gave him this good news.  Thanks Pamala Hurry.  He is going out of town but will come and pick up the sample and savings card on Monday.  We can also go over the details of use then

## 2014-05-03 MED ORDER — INSULIN GLARGINE 300 UNIT/ML ~~LOC~~ SOPN: 20.0000 [IU] | PEN_INJECTOR | Freq: Every day | SUBCUTANEOUS | Status: DC

## 2014-05-03 NOTE — Telephone Encounter (Signed)
I put the savings card w/sample of Toujeo in the refrigerator.

## 2014-05-03 NOTE — Telephone Encounter (Signed)
Message copied by Darreld Mclean on Mon May 03, 2014  8:44 AM ------      Message from: Lamar Blinks C      Created: Fri Apr 30, 2014  6:14 PM       insulin ------

## 2014-05-26 ENCOUNTER — Encounter: Payer: Self-pay | Admitting: Endocrinology

## 2014-05-26 ENCOUNTER — Ambulatory Visit (INDEPENDENT_AMBULATORY_CARE_PROVIDER_SITE_OTHER): Payer: Commercial Managed Care - HMO | Admitting: Endocrinology

## 2014-05-26 ENCOUNTER — Other Ambulatory Visit: Payer: Self-pay | Admitting: *Deleted

## 2014-05-26 VITALS — BP 154/98 | HR 84 | Temp 98.2°F | Resp 16 | Ht 65.5 in | Wt 236.0 lb

## 2014-05-26 DIAGNOSIS — E785 Hyperlipidemia, unspecified: Secondary | ICD-10-CM

## 2014-05-26 DIAGNOSIS — N184 Chronic kidney disease, stage 4 (severe): Secondary | ICD-10-CM

## 2014-05-26 DIAGNOSIS — I10 Essential (primary) hypertension: Secondary | ICD-10-CM

## 2014-05-26 DIAGNOSIS — E1165 Type 2 diabetes mellitus with hyperglycemia: Principal | ICD-10-CM

## 2014-05-26 DIAGNOSIS — E1129 Type 2 diabetes mellitus with other diabetic kidney complication: Secondary | ICD-10-CM

## 2014-05-26 MED ORDER — "INSULIN SYRINGE 31G X 5/16"" 0.5 ML MISC": Status: DC

## 2014-05-26 MED ORDER — INSULIN NPH ISOPHANE & REGULAR (70-30) 100 UNIT/ML ~~LOC~~ SUSP: SUBCUTANEOUS | Status: DC

## 2014-05-26 NOTE — Progress Notes (Signed)
Patient ID: Matthew Paik., male   DOB: [birth year], 53 y.o.   MRN: [id number]           Reason for Appointment: Consultation for Type 2 Diabetes  Referring physician: Copland  History of Present Illness:          Diagnosis: Type 2 diabetes mellitus, date of diagnosis: 1998       Past history: He was initially treated with Metformin and also Amaryl at some point but not clear what his prior level of control loss He thinks his blood sugars were improved with taking metformin but this was stopped when his renal function worsened His lab history indicates that his A1c has not been below 7% at any time and has been in the range of 8.5-11.7 in the last 2-3 years  Recent history:  He was started on insulin about a year ago. He may have been tried on premixed insulin but for the most part has been taking Lantus insulin only. He has not been taking any mealtime coverage Was on 30 units bid and for the last month has been taking 20 units 3 times a day on his own He has had difficulties with his insurance coverage and out-of-pocket expenses with this insulin and at times has not been taking it Recently however has been taking his insulin regularly but his blood sugars are still at least over 200 He is now referred here for further management       Oral hypoglycemic drugs the patient is taking are: none     Side effects from medications have been: None INSULIN regimen is described as Lantus 20 Units tid Compliance with the medical regimen:  variable Hypoglycemia:   never  Glucose monitoring:  done twice a day         Glucometer: Prodigy.      Blood Glucose readings by time of day from recall  PREMEAL Breakfast Lunch Dinner Bedtime  Overall   Glucose range: 250  260 320   Median:        Self-care: The diet that the patient has been following is: None. Frequently has high fat meals and snacks like potato chips despite having had diabetes education twice.   Meals: 3 meals per day.  Breakfast is sometimes fast food  and sometimes lunch also        Exercise: None           Dietician visit, most recent: 11/2013.               Weight history: Previous range 212-257  Wt Readings from Last 3 Encounters:  05/26/14 236 lb (107.049 kg)  04/08/14 235 lb 6.4 oz (106.777 kg)  03/11/14 237 lb 6.4 oz (107.684 kg)    Glycemic control:   Lab Results  Component Value Date   HGBA1C 9.5 03/11/2014   HGBA1C 10.2* 11/27/2013   HGBA1C 8.5 09/30/2013   Lab Results  Component Value Date   LDLCALC UNABLE TO CALCULATE IF TRIGLYCERIDE OVER 400 mg/dL 11/27/2013   CREATININE 2.2* 04/08/2014         Medication List       This list is accurate as of: 05/26/14 11:35 AM.  Always use your most recent med list.               amLODipine 10 MG tablet  Commonly known as:  NORVASC  Take 10 mg by mouth daily.     aspirin 81 MG tablet  Take 1 tablet (81  mg total) by mouth daily.     carvedilol 25 MG tablet  Commonly known as:  COREG  Take 25-50 mg by mouth See admin instructions. Take 50 mg in the morning with meals, then take 25 mg in the evening with meals.     clopidogrel 75 MG tablet  Commonly known as:  PLAVIX  Take 1 tablet (75 mg total) by mouth daily with breakfast.     diphenhydramine-acetaminophen 25-500 MG Tabs  Commonly known as:  TYLENOL PM  Take 2 tablets by mouth at bedtime.     fenofibrate 145 MG tablet  Commonly known as:  TRICOR  Take 1 tablet (145 mg total) by mouth daily.     gabapentin 300 MG capsule  Commonly known as:  NEURONTIN  Take 1 capsule (300 mg total) by mouth 3 (three) times daily.     Insulin Glargine 300 UNIT/ML Sopn  Inject 20 Units into the skin daily. Uses 20-30 units daily patient states he takes this 3 times a day     isosorbide mononitrate 30 MG 24 hr tablet  Commonly known as:  IMDUR  Take 90 mg by mouth daily.     losartan 100 MG tablet  Commonly known as:  COZAAR  Take 100 mg by mouth daily.     multivitamin with minerals  Tabs tablet  Take 1 tablet by mouth daily.     nitroGLYCERIN 0.4 MG SL tablet  Commonly known as:  NITROSTAT  Place 1 tablet (0.4 mg total) under the tongue every 5 (five) minutes as needed for chest pain.     pantoprazole 40 MG tablet  Commonly known as:  PROTONIX  Take 1 tablet (40 mg total) by mouth 2 (two) times daily with a meal.     pravastatin 40 MG tablet  Commonly known as:  PRAVACHOL  Take 40 mg by mouth daily.     RANEXA 500 MG 12 hr tablet  Generic drug:  ranolazine  Take 500 mg by mouth every 6 (six) hours.     vitamin E 400 UNIT capsule  Take 1,200 Units by mouth daily.     zolpidem 10 MG tablet  Commonly known as:  AMBIEN  Take 1 tablet (10 mg total) by mouth at bedtime.        Allergies:  Allergies  Allergen Reactions  . Ciprocin-Fluocin-Procin [Fluocinolone Acetonide] Rash  . Clarithromycin Itching and Other (See Comments)    "Biaxin" Eyes itch and burn  . Cleocin [Clindamycin Hcl] Anaphylaxis and Swelling  . Glimepiride [Amaryl] Other (See Comments)    Elevates liver function     Past Medical History  Diagnosis Date  . Chest pain     Chronic  . Dyslipidemia   . Coronary artery disease     a. INF MI 1999: PCI of RCA;  b.  extensive stenting of LAD;  c. s/p CABG with RIMA->RCA in 2006;  d. Stable, Low risk MV 05/2012; e. 09/2012 Cath: stable anatomy->med Rx. f. Abnl nuc 11/2013 -> med rx; g. 11/2013 Cath/PCI: LM 30d, LAD 50ost, 20/50isr, 100d, LCX 40-50ost, OM2 50ost, RI 70-80ost sup branch, RCA 20p, 90/60d(2.25x17 & 2.25x24 Promus DES'), PDA 60p, RIMA->RCA ok.  . Hypertension   . Hx of echocardiogram     a. Echo 3/12:  mild LVH, EF 55-60%, trivial AI  . DM2 (diabetes mellitus, type 2)   . History of blood transfusion ?2011  . GERD (gastroesophageal reflux disease)     "once; came to hospital" (09/15/2012)  .  OSA on CPAP   . CKD (chronic kidney disease)   . Polycystic kidney disease     Past Surgical History  Procedure Laterality Date  .  Cystectomy  1990's    off face  . Coronary angioplasty with stent placement  1999 / 2002 / 2004 / 2006?  Marland Kitchen Cardiac catheterization  2012  . Coronary artery bypass graft  2006    CABG X?; in [place]  . Cardiac catheterization  2013  . Cardiac catheterization  2014    LHC (1/14): total occlusion of distal LAD, diffuse disease in ramus, 70% proximal LCx, 50% proximal and mid RCA, 50% distal RCA, patent RIMA-RCA.    Family History  Problem Relation Age of Onset  . Lung cancer Mother   . Anemia Mother   . Polycystic kidney disease Mother   . Diabetes Father   . Heart attack Father 23    Died suddenly  . Heart failure Father   . Hyperlipidemia Father   . Hypertension Father   . Kidney failure Sister 5    Died  . Heart attack Sister   . Hypertension Sister   . Diabetes Sister   . Diabetes Brother   . Polycystic kidney disease Brother   . Diabetes Sister     Social History:  reports that he has never smoked. He has never used smokeless tobacco. He reports that he does not drink alcohol or use illicit drugs.    Review of Systems       Vision is normal. Most recent eye exam was 7/15 with optometrist       Lipids: He is currently on pravastatin. Recently started on fenofibrate daily in 8/15       Lab Results  Component Value Date   CHOL 212* 04/08/2014   HDL 31.60* 04/08/2014   LDLCALC UNABLE TO CALCULATE IF TRIGLYCERIDE OVER 400 mg/dL 11/27/2013   LDLDIRECT 50.6 04/08/2014   TRIG 983.0* 04/08/2014   CHOLHDL 7 04/08/2014                  Skin: No rash or infections     Thyroid:  No  unusual fatigue.     The blood pressure has been usually controlled with a combination of amlodipine, carvedilol and losartan      He has had coronary artery disease and still may tend to get chest pain on exertion     No swelling of feet.     No shortness of breath on exertion.     Bowel habits: Normal.      No joint  pains.          No history of Numbness, tingling or burning in feet,  was having arm pain when he was prescribed gabapentin and is still taking this    LABS:  No visits with results within 1 Week(s) from this visit. Latest known visit with results is:  Office Visit on 04/08/2014  Component Date Value Ref Range Status  . Sodium 04/08/2014 134* 135 - 145 mEq/L Final  . Potassium 04/08/2014 4.4  3.5 - 5.1 mEq/L Final  . Chloride 04/08/2014 104  96 - 112 mEq/L Final  . CO2 04/08/2014 22  19 - 32 mEq/L Final  . Glucose, Bld 04/08/2014 430* 70 - 99 mg/dL Final  . BUN 04/08/2014 25* 6 - 23 mg/dL Final  . Creatinine, Ser 04/08/2014 2.2* 0.4 - 1.5 mg/dL Final  . Calcium 04/08/2014 9.2  8.4 - 10.5 mg/dL Final  . GFR 04/08/2014  40.06* >60.00 mL/min Final  . Cholesterol 04/08/2014 212* 0 - 200 mg/dL Final   ATP III Classification       Desirable:  < 200 mg/dL               Borderline High:  200 - 239 mg/dL          High:  > = 240 mg/dL  . Triglycerides 04/08/2014 983.0* 0.0 - 149.0 mg/dL Final   Normal:  <150 mg/dLBorderline High:  150 - 199 mg/dLTriglyceride is over 400; calculations on Lipids are invalid.  Marland Kitchen HDL 04/08/2014 31.60* >39.00 mg/dL Final  . VLDL 04/08/2014 196.6* 0.0 - 40.0 mg/dL Final  . Total CHOL/HDL Ratio 04/08/2014 7   Final                  Men          Women1/2 Average Risk     3.4          3.3Average Risk          5.0          4.42X Average Risk          9.6          7.13X Average Risk          15.0          11.0                      . NonHDL 04/08/2014 180.40   Final   NOTE:  Non-HDL goal should be 30 mg/dL higher than patient's LDL goal (i.e. LDL goal of < 70 mg/dL, would have non-HDL goal of < 100 mg/dL)  . Direct LDL 04/08/2014 50.6   Final   Optimal:  <100 mg/dLNear or Above Optimal:  100-129 mg/dLBorderline High:  130-159 mg/dLHigh:  160-189 mg/dLVery High:  >190 mg/dL    Physical Examination:  BP 154/98  Pulse 84  Temp(Src) 98.2 F (36.8 C)  Resp 16  Ht 5' 5.5" (1.664 m)  Wt 236 lb (107.049 kg)  BMI 38.66 kg/m2  SpO2  96%  GENERAL:         Patient has marked generalized obesity.   HEENT:         Eye exam shows normal external appearance. Fundus exam shows no retinopathy. Oral exam shows normal mucosa .  NECK:         General:  Neck exam shows no lymphadenopathy. Carotids are normal to palpation and no bruit heard.  Thyroid is just palpable on the right and no nodules felt.   LUNGS:         Chest is symmetrical. Lungs are clear to auscultation.Marland Kitchen   HEART:         Heart sounds:  S1 and S2 are normal. No murmurs or clicks heard., no S3 or S4.   ABDOMEN:   he has abdominal obesity. Liver and spleen are not palpable. No other mass or tenderness present.  EXTREMITIES:     There is no edema. No skin lesions present.Marland Kitchen  NEUROLOGICAL:   Vibration sense is minimally reduced in toes. Ankle jerks are 2+ bilaterally.          Diabetic foot exam shows normal monofilament sensation in the toes and plantar surfaces, no skin lesions or ulcers on the feet and normal pedal pulses   MUSCULOSKELETAL:       There is no enlargement or deformity of the joints. Spine is normal to inspection.Marland Kitchen  SKIN:       No rash or lesions of concern.        ASSESSMENT:  Diabetes type 2, uncontrolled    He has had persistently poor control of the diabetes for several years Currently on only basal insulin with A1c in July over 9% He has had significant difficulties with affording his medications in the donut hole and insurance coverage Also has significant obesity with BMI almost 40 Has limited choices of treatment options because of his renal dysfunction Current blood sugar patterns appear to show blood sugars fairly consistently around 250 throughout the day, somewhat higher after supper Problems identified include limited ability to exercise and also inconsistent diet with sometimes  high fat intake He does need more diabetes education and has little understanding about insulin However he is a good candidate for adding a drug to help him with  postprandial control and weight loss  Complications: Probable nephropathy, no evidence of significant neuropathy or retinopathy  HYPERLIPIDEMIA: He has had marked increase in triglycerides but this was done when his glucose was over 400 He does need to be on fenofibrate but because of his stage IV kidney disease his dose needs to be reduced to 43 mg daily. He can continue the 145 mg tablet twice a week for now  Other problems include Hypertension, coronary artery disease managed by a cardiologist and PCP  CHRONIC renal dysfunction: This may be related to his diabetes and hypertension but no details of his evaluation are available. He will need to followup with nephrologist  PLAN:   Discussed using Victoza although not clear if he can afford this He will try to get Victoza through the pharmaceutical company assistance program since he is in the donut hole Discussed how to use the Victoza flex pen and doses titration, side effects, benefits and timing of injection While waiting for the Victoza he will change his Lantus to 40 units twice a day Start regular insulin to cover his meals starting with 10 units and increasing the dose based on postprandial readings Discussed timing of glucose monitoring and blood sugar targets   Patient Instructions  Lantus 40 units twice daily  Regular insulin 10 units 15-30 minutes before each meal  Check sugar at least 2x  Daily either before eating or 2 hrs after eating  Start VICTOZA injection with the pen once daily at the same time of the day.  Dial the dose to 0.6 mg for the first week.  You may possibly experience nausea in the first few days which usually gets better in a couple of days After 1 week increase the dose to 1.2mg  daily if no nausea present.  You may inject in the stomach, thigh or arm.   You will feel fullness of the stomach with starting the medication and should try to keep portions of food small.  Call us or the [place] helpline  at [phone] or visit [email or web address] for any questions  Take fenofibrate 2 times a week   Counseling time over 50% of today's 60 minute visit    Stephenie Navejas 05/26/2014, 11:35 AM   Note: This office note was prepared with Estate agent. Any transcriptional errors that result from this process are unintentional.

## 2014-05-26 NOTE — Patient Instructions (Addendum)
Lantus 40 units twice daily  Regular insulin 10 units 15-30 minutes before each meal  Check sugar at least 2x  Daily either before eating or 2 hrs after eating  Start VICTOZA injection with the pen once daily at the same time of the day.  Dial the dose to 0.6 mg for the first week.  You may possibly experience nausea in the first few days which usually gets better in a couple of days After 1 week increase the dose to 1.2mg  daily if no nausea present.  You may inject in the stomach, thigh or arm.   You will feel fullness of the stomach with starting the medication and should try to keep portions of food small.  Call us or the [place] helpline at [phone] or visit [email or web address] for any questions  Take fenofibrate 2 times a week

## 2014-05-31 ENCOUNTER — Other Ambulatory Visit: Payer: Self-pay | Admitting: Family Medicine

## 2014-06-04 ENCOUNTER — Telehealth: Payer: Self-pay | Admitting: Cardiology

## 2014-06-04 NOTE — Telephone Encounter (Signed)
Form completed, returned to HIM. Pt advised form completed.

## 2014-06-04 NOTE — Telephone Encounter (Signed)
New message      Pt want to know if his forms he dropped off and paid for has been completed?  I think it is a disability form.  Please call

## 2014-06-09 ENCOUNTER — Other Ambulatory Visit: Payer: Self-pay | Admitting: Family Medicine

## 2014-06-14 ENCOUNTER — Other Ambulatory Visit: Payer: Commercial Managed Care - HMO

## 2014-06-17 ENCOUNTER — Ambulatory Visit: Payer: Commercial Managed Care - HMO | Admitting: Endocrinology

## 2014-06-17 ENCOUNTER — Other Ambulatory Visit: Payer: Self-pay | Admitting: *Deleted

## 2014-06-17 MED ORDER — LOSARTAN POTASSIUM 100 MG PO TABS
100.0000 mg | ORAL_TABLET | Freq: Every day | ORAL | Status: DC
Start: 2014-06-17 — End: 2014-08-25

## 2014-06-21 ENCOUNTER — Telehealth: Payer: Self-pay | Admitting: *Deleted

## 2014-06-21 NOTE — Telephone Encounter (Signed)
Ranexa samples placed at the front desk for pick up.

## 2014-06-22 ENCOUNTER — Other Ambulatory Visit: Payer: Medicare HMO

## 2014-06-30 ENCOUNTER — Other Ambulatory Visit: Payer: Self-pay | Admitting: Family Medicine

## 2014-07-01 ENCOUNTER — Ambulatory Visit (INDEPENDENT_AMBULATORY_CARE_PROVIDER_SITE_OTHER): Payer: Commercial Managed Care - HMO | Admitting: Endocrinology

## 2014-07-01 ENCOUNTER — Encounter: Payer: Self-pay | Admitting: Endocrinology

## 2014-07-01 ENCOUNTER — Other Ambulatory Visit: Payer: Self-pay | Admitting: *Deleted

## 2014-07-01 VITALS — BP 130/80 | HR 97 | Temp 98.0°F | Resp 16 | Ht 65.5 in | Wt 242.6 lb

## 2014-07-01 DIAGNOSIS — E1165 Type 2 diabetes mellitus with hyperglycemia: Secondary | ICD-10-CM

## 2014-07-01 DIAGNOSIS — IMO0002 Reserved for concepts with insufficient information to code with codable children: Secondary | ICD-10-CM

## 2014-07-01 DIAGNOSIS — Z23 Encounter for immunization: Secondary | ICD-10-CM

## 2014-07-01 MED ORDER — GLUCOSE BLOOD VI STRP: ORAL_STRIP | Status: DC

## 2014-07-01 MED ORDER — ACCU-CHEK SOFTCLIX LANCETS MISC: Status: DC

## 2014-07-01 NOTE — Patient Instructions (Addendum)
12 units at Bfst, 15 lunch and 20 before dinner  Try taking 20-30 min before meals  Please check blood sugars at least half the time about 2 hours after any meal and 4-5times per week on waking up. Please bring blood sugar monitor to each visit  Walk daily or as tolerated

## 2014-07-01 NOTE — Progress Notes (Signed)
Patient ID: Matthew Purohit., male   DOB: [birth year], 53 y.o.   MRN: [id number]           Reason for Appointment: F/u for Type 2 Diabetes  Referring physician: Copland  History of Present Illness:          Diagnosis: Type 2 diabetes mellitus, date of diagnosis: 1998       Past history: He was initially treated with Metformin and also Amaryl at some point but not clear what his prior level of control loss He thinks his blood sugars were improved with taking metformin but this was stopped when his renal function worsened His lab history indicates that his A1c has not been below 7% at any time and has been in the range of 8.5-11.7 in the last 2-3 years  Recent history:  He was started on insulin about a year ago.  He has had poor control with taking Lantus insulin alone, also at some point had tried premixed insulin Because of significantly higher postprandial readings he was advised to start mealtime insulin also with regular insulin on his last visit However he has been taking 70/30 insulin at mealtimes instead of regular insulin He was told to start Victoza through the  company indigent program but apparently was told that he does not qualify  Also he ran out of Lantus about a week ago and is waiting for his refill, currently on a medication assistance program He was told to increase Lantus to 40 units twice a day on his last visit but has been taking only 25 on his own His blood sugars have been mostly over 200 since his last visit but somewhat better than before when he was having some over 300 Currently still using his Prodigy monitor and did not keep any records for the last week In the last few days she has on his own increased his 70/30 insulin to 15 units and his glucose was relatively better today at 187 fasting and 115 before lunch Readings after dinner are consistently high Has not seen a dietitian as yet, was referred on his last visit       Oral hypoglycemic drugs the  patient is taking are: none     Side effects from medications have been: None INSULIN regimen is described as Novolin 70/30 15 units ac for 5 days  Compliance with the medical regimen:  variable Hypoglycemia:   never  Glucose monitoring:  done twice a day         Glucometer: Prodigy.      Blood Glucose readings by time of day as above   Self-care: The diet that the patient has been following is: None. Periodically has high fat meals and snacks like potato chips despite having had diabetes education twice.   Meals: 3 meals per day. Breakfast is sometimes fast food  and sometimes lunch also        Exercise: None           Dietician visit, most recent: 11/2013.               Weight history: Previous range 212-257  Wt Readings from Last 3 Encounters:  07/01/14 242 lb 9.6 oz (110.043 kg)  05/26/14 236 lb (107.049 kg)  04/08/14 235 lb 6.4 oz (106.777 kg)    Glycemic control:   Lab Results  Component Value Date   HGBA1C 9.5 03/11/2014   HGBA1C 10.2* 11/27/2013   HGBA1C 8.5 09/30/2013   Lab Results  Component  Value Date   LDLCALC UNABLE TO CALCULATE IF TRIGLYCERIDE OVER 400 mg/dL 11/27/2013   CREATININE 2.2* 04/08/2014         Medication List       This list is accurate as of: 07/01/14  9:21 PM.  Always use your most recent med list.               ACCU-CHEK SOFTCLIX LANCETS lancets  Use as instructed to check blood sugar 4 times per day dx code E.11.9     amLODipine 10 MG tablet  Commonly known as:  NORVASC  Take 10 mg by mouth daily.     aspirin 81 MG tablet  Take 1 tablet (81 mg total) by mouth daily.     B-D UF III MINI PEN NEEDLES 31G X 5 MM Misc  Generic drug:  Insulin Pen Needle  USE ONE PENNEEDLE DAILY FOR INSULIN INJECTION.     carvedilol 25 MG tablet  Commonly known as:  COREG  Take 25-50 mg by mouth See admin instructions. Take 50 mg in the morning with meals, then take 25 mg in the evening with meals.     clopidogrel 75 MG tablet  Commonly known as:   PLAVIX  Take 1 tablet (75 mg total) by mouth daily with breakfast.     diphenhydramine-acetaminophen 25-500 MG Tabs  Commonly known as:  TYLENOL PM  Take 2 tablets by mouth at bedtime.     fenofibrate 145 MG tablet  Commonly known as:  TRICOR  Take 1 tablet (145 mg total) by mouth daily.     gabapentin 300 MG capsule  Commonly known as:  NEURONTIN  Take 1 capsule (300 mg total) by mouth 3 (three) times daily.     glucose blood test strip  Commonly known as:  ACCU-CHEK AVIVA PLUS  Use as instructed to check blood sugar 4 times per day dx code E11.9     hydrochlorothiazide 25 MG tablet  Commonly known as:  HYDRODIURIL     Insulin Glargine 300 UNIT/ML Sopn  Inject 20 Units into the skin daily. Uses 20-30 units daily patient states he takes this 3 times a day     INSULIN SYRINGE .5CC/31GX5/16" 31G X 5/16" 0.5 ML Misc  Use one needle three times per day     isosorbide mononitrate 30 MG 24 hr tablet  Commonly known as:  IMDUR  Take 90 mg by mouth daily.     losartan 100 MG tablet  Commonly known as:  COZAAR  Take 1 tablet (100 mg total) by mouth daily.     multivitamin with minerals Tabs tablet  Take 1 tablet by mouth daily.     nitroGLYCERIN 0.4 MG SL tablet  Commonly known as:  NITROSTAT  Place 1 tablet (0.4 mg total) under the tongue every 5 (five) minutes as needed for chest pain.     NOVOLIN 70/30 RELION (70-30) 100 UNIT/ML injection  Generic drug:  insulin NPH-regular Human     pantoprazole 40 MG tablet  Commonly known as:  PROTONIX  Take 1 tablet (40 mg total) by mouth 2 (two) times daily with a meal.     pravastatin 40 MG tablet  Commonly known as:  PRAVACHOL  Take 40 mg by mouth daily.     RANEXA 500 MG 12 hr tablet  Generic drug:  ranolazine  Take 500 mg by mouth every 6 (six) hours.     vitamin E 400 UNIT capsule  Take 1,200 Units by mouth daily.  zolpidem 10 MG tablet  Commonly known as:  AMBIEN  Take 1 tablet (10 mg total) by mouth at bedtime.          Allergies:  Allergies  Allergen Reactions  . Ciprocin-Fluocin-Procin [Fluocinolone Acetonide] Rash  . Clarithromycin Itching and Other (See Comments)    "Biaxin" Eyes itch and burn  . Cleocin [Clindamycin Hcl] Anaphylaxis and Swelling  . Glimepiride [Amaryl] Other (See Comments)    Elevates liver function     Past Medical History  Diagnosis Date  . Chest pain     Chronic  . Dyslipidemia   . Coronary artery disease     a. INF MI 1999: PCI of RCA;  b.  extensive stenting of LAD;  c. s/p CABG with RIMA->RCA in 2006;  d. Stable, Low risk MV 05/2012; e. 09/2012 Cath: stable anatomy->med Rx. f. Abnl nuc 11/2013 -> med rx; g. 11/2013 Cath/PCI: LM 30d, LAD 50ost, 20/50isr, 100d, LCX 40-50ost, OM2 50ost, RI 70-80ost sup branch, RCA 20p, 90/60d(2.25x17 & 2.25x24 Promus DES'), PDA 60p, RIMA->RCA ok.  . Hypertension   . Hx of echocardiogram     a. Echo 3/12:  mild LVH, EF 55-60%, trivial AI  . DM2 (diabetes mellitus, type 2)   . History of blood transfusion ?2011  . GERD (gastroesophageal reflux disease)     "once; came to hospital" (09/15/2012)  . OSA on CPAP   . CKD (chronic kidney disease)   . Polycystic kidney disease     Past Surgical History  Procedure Laterality Date  . Cystectomy  1990's    off face  . Coronary angioplasty with stent placement  1999 / 2002 / 2004 / 2006?  Marland Kitchen Cardiac catheterization  2012  . Coronary artery bypass graft  2006    CABG X?; in [place]  . Cardiac catheterization  2013  . Cardiac catheterization  2014    LHC (1/14): total occlusion of distal LAD, diffuse disease in ramus, 70% proximal LCx, 50% proximal and mid RCA, 50% distal RCA, patent RIMA-RCA.    Family History  Problem Relation Age of Onset  . Lung cancer Mother   . Anemia Mother   . Polycystic kidney disease Mother   . Diabetes Father   . Heart attack Father 81    Died suddenly  . Heart failure Father   . Hyperlipidemia Father   . Hypertension Father   . Kidney failure Sister  5    Died  . Heart attack Sister   . Hypertension Sister   . Diabetes Sister   . Diabetes Brother   . Polycystic kidney disease Brother   . Diabetes Sister     Social History:  reports that he has never smoked. He has never used smokeless tobacco. He reports that he does not drink alcohol or use illicit drugs.    Review of Systems       Vision is normal. Most recent eye exam was 7/15 with optometrist       Lipids: He is currently on pravastatin. Recently started on fenofibrate daily in 8/15, on his last visit was told to take this twice a week because of impaired renal function       Lab Results  Component Value Date   CHOL 212* 04/08/2014   HDL 31.60* 04/08/2014   LDLCALC UNABLE TO CALCULATE IF TRIGLYCERIDE OVER 400 mg/dL 11/27/2013   LDLDIRECT 50.6 04/08/2014   TRIG 983.0* 04/08/2014   CHOLHDL 7 04/08/2014  The blood pressure has been usually controlled with a combination of amlodipine, carvedilol and losartan         No history of Numbness, tingling or burning in feet, was having arm pain when he was prescribed gabapentin and is still taking this   Diabetic foot exam in 9/15 showed normal monofilament sensation in the toes and plantar surfaces, no skin lesions or ulcers on the feet and normal pedal pulses   Complications: Probable nephropathy, no evidence of significant neuropathy or retinopathy    Physical Examination:  BP 130/80  Pulse 97  Temp(Src) 98 F (36.7 C)  Resp 16  Ht 5' 5.5" (1.664 m)  Wt 242 lb 9.6 oz (110.043 kg)  BMI 39.74 kg/m2  SpO2 97%    ASSESSMENT/PLAN:    Diabetes type 2, uncontrolled    He has had persistently poor control of the diabetes for several years He has had significant difficulties with affording his medications in the donut hole and also finds his co-pay At present he only has 70/30 insulin at home, taking this 3 times a day He did not appear to be getting good control with taking 50 units of Lantus a day and  mostly has tendency to hypotension readings especially after lunch and dinner He is still waiting for counseling for his meal planning and has not started exercising as discussed  For now he will continue the 70/30 insulin but will increase her dose at that time and reduce the dose at breakfast Discussed timing of the insulin, actions on mealtime and overnight control  Doses as below  He was instructed on how to use the Accu-Chek monitor which will be downloaded on his next visit, He will keep appointment for the dietitian/nurse educator to see him next month   Patient Instructions  12 units at Bfst, 15 lunch and 20 before dinner  Try taking 20-30 min before meals  Please check blood sugars at least half the time about 2 hours after any meal and 4-5times per week on waking up. Please bring blood sugar monitor to each visit  Walk daily or as tolerated   Counseling time over 50% of today's 25 minute visit    Raguel Kosloski 07/01/2014, 9:21 PM   Note: This office note was prepared with Estate agent. Any transcriptional errors that result from this process are unintentional.

## 2014-07-05 IMAGING — CR DG CHEST 1V PORT
1 series · 1 of 1 positions shown · non-contrast
Comparison: November 10, 2011.

CLINICAL DATA: Chest pain.

PORTABLE CHEST - 1 VIEW

[AP]
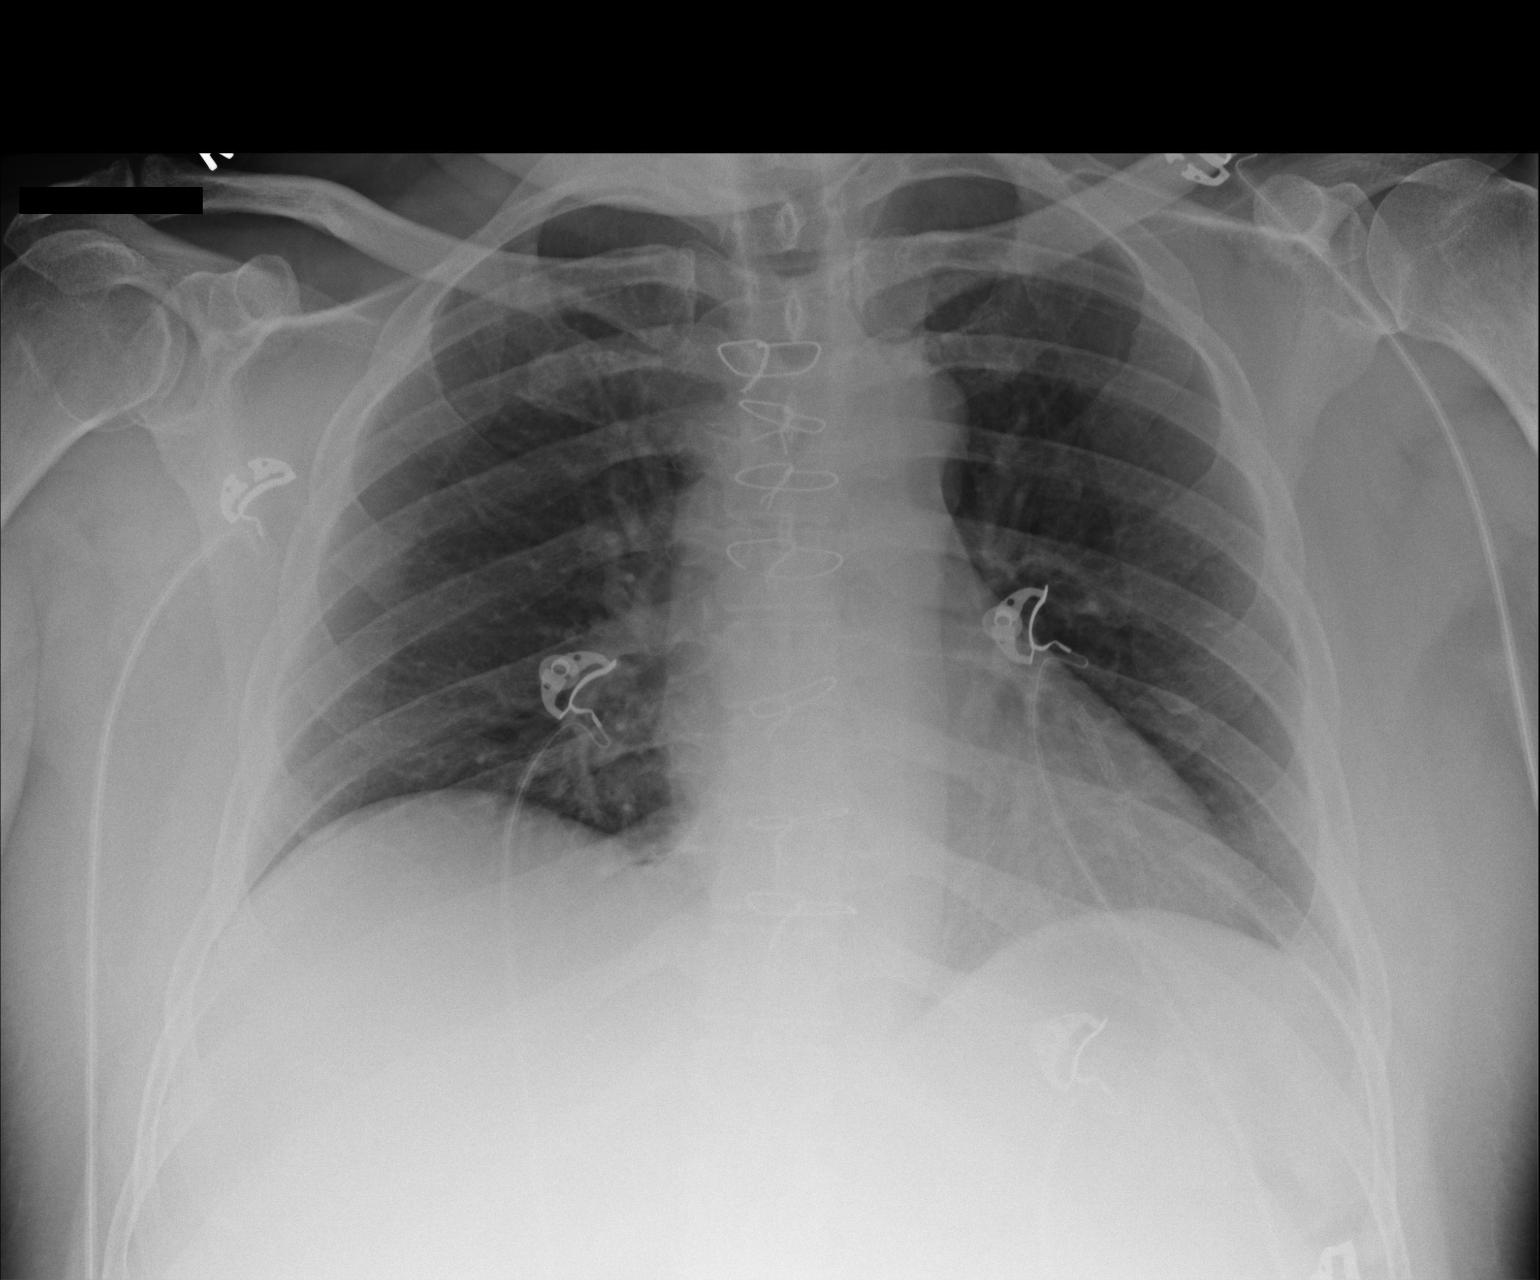

[1 of 1 positions shown; findings below may reference images not displayed]

FINDINGS: Sternotomy wires are noted.  Otherwise cardiomediastinal
silhouette appears normal.  No acute pulmonary disease is noted.
Bony thorax is intact.
IMPRESSION: No acute cardiopulmonary abnormality seen.

## 2014-07-07 ENCOUNTER — Encounter: Payer: Self-pay | Admitting: Cardiology

## 2014-07-07 ENCOUNTER — Ambulatory Visit (INDEPENDENT_AMBULATORY_CARE_PROVIDER_SITE_OTHER): Payer: Commercial Managed Care - HMO | Admitting: Cardiology

## 2014-07-07 VITALS — BP 124/82 | HR 91 | Ht 65.5 in | Wt 243.0 lb

## 2014-07-07 DIAGNOSIS — N183 Chronic kidney disease, stage 3 unspecified: Secondary | ICD-10-CM

## 2014-07-07 DIAGNOSIS — I25701 Atherosclerosis of coronary artery bypass graft(s), unspecified, with angina pectoris with documented spasm: Secondary | ICD-10-CM

## 2014-07-07 DIAGNOSIS — I25810 Atherosclerosis of coronary artery bypass graft(s) without angina pectoris: Secondary | ICD-10-CM

## 2014-07-07 MED ORDER — CARVEDILOL 25 MG PO TABS: ORAL_TABLET | ORAL | Status: DC

## 2014-07-07 NOTE — Patient Instructions (Signed)
Increase coreg (carvedilol) to 50mg  two times a day. This will be 2 of your 25mg  tablets two times a day.  Your physician recommends that you return for a FASTING lipid profile/BMET.  Your physician recommends that you schedule a follow-up appointment in: 4 months with Dr Aundra Dubin.

## 2014-07-08 ENCOUNTER — Other Ambulatory Visit (INDEPENDENT_AMBULATORY_CARE_PROVIDER_SITE_OTHER): Payer: Commercial Managed Care - HMO | Admitting: *Deleted

## 2014-07-08 DIAGNOSIS — I25810 Atherosclerosis of coronary artery bypass graft(s) without angina pectoris: Secondary | ICD-10-CM

## 2014-07-08 LAB — BASIC METABOLIC PANEL
BUN: 24 mg/dL — ABNORMAL HIGH (ref 6–23)
CHLORIDE: 109 meq/L (ref 96–112)
CO2: 20 mEq/L (ref 19–32)
Calcium: 9.3 mg/dL (ref 8.4–10.5)
Creatinine, Ser: 2.1 mg/dL — ABNORMAL HIGH (ref 0.4–1.5)
GFR: 42.92 mL/min — ABNORMAL LOW (ref >60.00)
Glucose, Bld: 155 mg/dL — ABNORMAL HIGH (ref 70–99)
Potassium: 4.5 mEq/L (ref 3.5–5.1)
SODIUM: 138 meq/L (ref 135–145)

## 2014-07-08 LAB — LIPID PANEL
CHOL/HDL RATIO: 6
Cholesterol: 182 mg/dL (ref 0–200)
HDL: 29.5 mg/dL — ABNORMAL LOW (ref >39.00)
NONHDL: 152.5
Triglycerides: 294 mg/dL — ABNORMAL HIGH (ref 0.0–149.0)
VLDL: 58.8 mg/dL — ABNORMAL HIGH (ref 0.0–40.0)

## 2014-07-08 LAB — LDL CHOLESTEROL, DIRECT: LDL DIRECT: 83.2 mg/dL

## 2014-07-09 NOTE — Progress Notes (Signed)
Patient ID: Matthew Haislip., male   DOB: 1961-08-12, 53 y.o.   MRN: [id number] PCP: Dr. Lorelei Pont  53 yo with history CAD s/p CABG (RIMA to RCA, anomalous RCA off left cusp), occlusion of the distal LAD, and chronic angina presents for cardiology followup.  Patient has had chronic angina for years. In 1/14, he developed progressive angina and was hospitalized.  LHC was done, showing unchanged anatomy.  RIMA-RCA was patent.  He did well after this on ranolazine.  In 4/14, he ran out of ranolazine and the chest pain returned. He was admitted overnight in 4/14 and ruled out for MI.  In 7/14, he had again run low on ranolazine and developed more chest pain.  He was admitted again and ruled out for MI.  In 3/15, he was admitted with recurrent chest pain.  LHC was done, showing 90% distal RCA stenosis after RIMA insertion.  He had Promus DES x 2 to distal RCA.    Since last admission, he has been doing better.  Chest pain is relatively rare now, only occurs when he walks fast.  No significant exertional dyspnea.  No orthopnea/PND.  No lightheadedness or palpitations.    Labs (7/13): K 4.9, creatinine 1.75, LDL 63 Labs (1/14): K 4.2, creatinine 1.65, TGs 483 (could not calculate LDL) Labs (4/14): K 4.8, creatinine 1.62, HCT 36.8 Labs (7/14): K 4.3, creatinine 1.63, LDL 56, HDL 34, TGs 382 Labs (1/15): K 4.6, creatinine 2 Labs (3/15): TGs 605, LDL unable to calculate Labs (5/15): K 4.7, creatinine 2.2 Labs (7/15): K 4.4, creatinine 2.2, LDL 51, HDL 32, TGs 983  PMH: 1. CAD: CABG 2006 at [facility] with [place].  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  Patient has had prior extensive PCI to LAD with occlusion of the distal LAD.  LHC (7/12): proximal LAD stent, extensive mid-distal LAD stenting, 60-70% mid LAD instent restenosis, subtotal distal LAD instent restenosis, LCFx without significant disease, anomalous RCA off left cusp with mild disease, patent RIMA-RCA.  Echo (3/12): EF  55-60%, trivial AI, mild LVH.  Lexiscan myoview (9/13) with small apical scar, moderate inferior scar from base to apex with some reversibility, EF 47% with apical hypokinesis (similar to prior study).  Chronic angina.  LHC (1/14): total occlusion of distal LAD, diffuse disease in ramus, 70% proximal LCx, 50% proximal and mid RCA, 50% distal RCA, patent RIMA-RCA.   LHC (3/15) with multiple stents in the LAD and 50% ostial LAD stenosis, total occlusion distal LAD, 40-50% ostial LCx stenosis, 90% distal RCA stenosis after RIMA insertion.  Patient had DES x 2 to distal RCA.  2. OSA: CPAP. 3. Type II diabetes 4. Hyperlipidemia: had side effects from Lipitor. Very high TGs.  5. HTN 6. CKD: polycystic kidney disease.  7. Ischemic cardiomyopathy: EF 55-60% by echo in 3/12, EF 47% with apical hypokinesis in 9/13.   SH: Nonsmoker.  Married, lives in [place].  On disability.   FH: Multiple family members with MIs.   ROS: All systems reviewed and negative except as per HPI.   Current Outpatient Prescriptions  Medication Sig Dispense Refill  . ACCU-CHEK SOFTCLIX LANCETS lancets Use as instructed to check blood sugar 4 times per day dx code E.11.9  200 each  3  . amLODipine (NORVASC) 10 MG tablet Take 10 mg by mouth daily.      Marland Kitchen aspirin 81 MG tablet Take 1 tablet (81 mg total) by mouth daily.      Marland Kitchen  B-D UF III MINI PEN NEEDLES 31G X 5 MM MISC USE ONE PENNEEDLE DAILY FOR INSULIN INJECTION.  100 each  0  . clopidogrel (PLAVIX) 75 MG tablet Take 1 tablet (75 mg total) by mouth daily with breakfast.  90 tablet  3  . diphenhydramine-acetaminophen (TYLENOL PM) 25-500 MG TABS Take 2 tablets by mouth at bedtime.      . fenofibrate (TRICOR) 145 MG tablet Take 1 tablet (145 mg total) by mouth daily.  30 tablet  3  . gabapentin (NEURONTIN) 300 MG capsule Take 1 capsule (300 mg total) by mouth 3 (three) times daily.  270 capsule  3  . glucose blood (ACCU-CHEK AVIVA PLUS) test strip Use as instructed to check  blood sugar 4 times per day dx code E11.9  125 each  3  . hydrochlorothiazide (HYDRODIURIL) 25 MG tablet       . Insulin Glargine 300 UNIT/ML SOPN Inject 20 Units into the skin daily. Uses 20-30 units daily patient states he takes this 3 times a day      . Insulin Syringe-Needle U-100 (INSULIN SYRINGE .5CC/31GX5/16") 31G X 5/16" 0.5 ML MISC Use one needle three times per day  100 each  3  . isosorbide mononitrate (IMDUR) 30 MG 24 hr tablet Take 90 mg by mouth daily.      Marland Kitchen losartan (COZAAR) 100 MG tablet Take 1 tablet (100 mg total) by mouth daily.  30 tablet  0  . Multiple Vitamin (MULTIVITAMIN WITH MINERALS) TABS tablet Take 1 tablet by mouth daily.      . nitroGLYCERIN (NITROSTAT) 0.4 MG SL tablet Place 1 tablet (0.4 mg total) under the tongue every 5 (five) minutes as needed for chest pain.  25 tablet  3  . NOVOLIN 70/30 RELION (70-30) 100 UNIT/ML injection       . pantoprazole (PROTONIX) 40 MG tablet Take 1 tablet (40 mg total) by mouth 2 (two) times daily with a meal.  180 tablet  3  . pravastatin (PRAVACHOL) 40 MG tablet Take 40 mg by mouth daily.      . vitamin E 400 UNIT capsule Take 1,200 Units by mouth daily.      Marland Kitchen zolpidem (AMBIEN) 10 MG tablet Take 1 tablet (10 mg total) by mouth at bedtime.  90 tablet  1  . carvedilol (COREG) 25 MG tablet 2 tablets (50mg ) two times a day  120 tablet  6  . ranolazine (RANEXA) 1000 MG SR tablet Take 1 tablet (1,000 mg total) by mouth 2 (two) times daily.       No current facility-administered medications for this visit.    BP 124/82  Pulse 91  Ht 5' 5.5" (1.664 m)  Wt 243 lb (110.224 kg)  BMI 39.81 kg/m2 General: NAD, obese.  Neck: No JVD, no thyromegaly or thyroid nodule.  Lungs: Clear to auscultation bilaterally with normal respiratory effort. CV: Nondisplaced PMI.  Heart regular S1/S2, no S3/S4, no murmur.  No peripheral edema.  No carotid bruit.  Normal pedal pulses.  Abdomen: Soft, nontender, no hepatosplenomegaly, no distention.   Neurologic: Alert and oriented x 3.  Psych: Normal affect. Extremities: No clubbing or cyanosis.   Assessment/Plan: 1. CAD:  Angina much improved since 3/15 dRCA PCI.  As long as he is taking his ranolazine, he gets chest pain only with heavy exertion. - Continue ASA 81 and Plavix long-term. - Continue statin, amlodipine, ranolazine. - Increase Coreg to 50 mg bid to see if this helps with angina.  2.  HTN: Controlled. 3. Hyperlipidemia: Triglycerides very high in 7/15, fenofibrate started.  I will repeat lipids today. 4. CKD: Patient has APKD.  Creatinine seems to be running higher, up to 2.2 when last checked. He has a nephrologist.  I will repeat BMET today.   [name] 07/09/2014

## 2014-07-19 ENCOUNTER — Ambulatory Visit: Payer: Commercial Managed Care - HMO | Admitting: *Deleted

## 2014-07-27 ENCOUNTER — Telehealth: Payer: Self-pay

## 2014-07-27 NOTE — Telephone Encounter (Signed)
Dr Lorelei Pont, we received an order from [employer] asst prog for pt's lantus I started to complete it and read Dr Ronnie Derby notes from 07/01/14 OV. It appears to me from the notes that he took pt off of Lantus and increased his 70/30 doses. I have put the order in your box for your review. I wanted you to review and advise to make sure I am correct in my understanding and you are aware.

## 2014-07-27 NOTE — Telephone Encounter (Signed)
Called Matthew Rowe per Dr Lillie Fragmin inst's and he stated that he does still need his Lantus, and he has contacted them to have them send it to his home. When asked if Dr Dwyane Dee had taken him off of the Lantus, he said "Well that was just kind of a temporary thing, but I need my Lantus because it kind or regulates my blood sugar which is kind of all over the place now" . I advised Matthew Rowe to RTC or to see Dr Dwyane Dee to help get back under control and Matthew Rowe stated that he is going to come in and see Dr Lorelei Pont anyway this week to get his medications straight for next year. Gave him her walk in hrs this week. Dr Lorelei Pont, FYI to contact Dr Dwyane Dee.

## 2014-07-28 NOTE — Telephone Encounter (Signed)
Did do refill as he requested but have sent a note to Dr. Dwyane Dee to clarify

## 2014-08-02 ENCOUNTER — Other Ambulatory Visit: Payer: Self-pay | Admitting: *Deleted

## 2014-08-02 ENCOUNTER — Telehealth: Payer: Self-pay | Admitting: Family Medicine

## 2014-08-02 MED ORDER — INSULIN NPH ISOPHANE & REGULAR (70-30) 100 UNIT/ML ~~LOC~~ SUSP: 15.0000 [IU] | Freq: Three times a day (TID) | SUBCUTANEOUS | Status: DC

## 2014-08-02 NOTE — Telephone Encounter (Signed)
-----   Message from Elayne Snare, MD sent at 07/29/2014  9:32 AM EST ----- Regarding: insulin Hi Janett Billow  I agree he is not on Lantus, he was supposed to be on 70/30 insulin Before each meal which will help his postprandial readings also   ----- Message -----    From: Darreld Mclean, MD    Sent: 07/28/2014   3:07 PM      To: Elayne Snare, MD  Hi Dr. Dwyane Dee, Thanks for taking care of my pt Taige.  He send a request for my office to approve lantus for him recently.  However from your note I thought he was not supposed to be on lantus any longer.  Can you clarify?  As you know he does tend to be noncompliant  Best regards, JC

## 2014-08-02 NOTE — Telephone Encounter (Signed)
Called and spoke with pt- he states he was going to use lantus because he did not have enough 70/30 in his rx to last for a month.  Advised him that in this case he should let Dr. Dwyane Dee know that he needs more in his rx.  Will send him a message and pt plans to call.  Advised Matthew Rowe that switching back and forth between 70/30 and lantus is not the best way to go

## 2014-08-02 NOTE — Telephone Encounter (Signed)
Please check his prescription status for the 7030

## 2014-08-03 NOTE — Telephone Encounter (Signed)
New rx for 70/30 sent in for 15 units TID.

## 2014-08-10 ENCOUNTER — Telehealth: Payer: Self-pay

## 2014-08-10 ENCOUNTER — Other Ambulatory Visit: Payer: Self-pay | Admitting: *Deleted

## 2014-08-10 MED ORDER — INSULIN NPH ISOPHANE & REGULAR (70-30) 100 UNIT/ML ~~LOC~~ SUSP: 15.0000 [IU] | Freq: Three times a day (TID) | SUBCUTANEOUS | Status: DC

## 2014-08-10 NOTE — Telephone Encounter (Addendum)
Wife called and wanted to speak with administrator b/c Dr. Lorelei Pont had not called in his refills.  Then she realized Dr. Dwyane Dee was going to have it filled and requested his phone number.  Callled back b/c the Medical Directory is incorrect.  Looked it up online.   [phone]

## 2014-08-12 ENCOUNTER — Other Ambulatory Visit: Payer: Commercial Managed Care - HMO

## 2014-08-12 ENCOUNTER — Other Ambulatory Visit (INDEPENDENT_AMBULATORY_CARE_PROVIDER_SITE_OTHER): Payer: Commercial Managed Care - HMO

## 2014-08-12 DIAGNOSIS — IMO0002 Reserved for concepts with insufficient information to code with codable children: Secondary | ICD-10-CM

## 2014-08-12 DIAGNOSIS — E1165 Type 2 diabetes mellitus with hyperglycemia: Secondary | ICD-10-CM

## 2014-08-12 DIAGNOSIS — E785 Hyperlipidemia, unspecified: Secondary | ICD-10-CM

## 2014-08-12 DIAGNOSIS — E1129 Type 2 diabetes mellitus with other diabetic kidney complication: Secondary | ICD-10-CM

## 2014-08-12 LAB — COMPREHENSIVE METABOLIC PANEL
ALK PHOS: 105 U/L (ref 39–117)
ALT: 16 U/L (ref 0–53)
AST: 15 U/L (ref 0–37)
Albumin: 4.1 g/dL (ref 3.5–5.2)
BILIRUBIN TOTAL: 0.5 mg/dL (ref 0.2–1.2)
BUN: 32 mg/dL — AB (ref 6–23)
CO2: 21 mEq/L (ref 19–32)
Calcium: 8.9 mg/dL (ref 8.4–10.5)
Chloride: 100 mEq/L (ref 96–112)
Creatinine, Ser: 2.4 mg/dL — ABNORMAL HIGH (ref 0.4–1.5)
GFR: 36.03 mL/min — ABNORMAL LOW (ref >60.00)
Glucose, Bld: 375 mg/dL — ABNORMAL HIGH (ref 70–99)
Potassium: 4.7 mEq/L (ref 3.5–5.1)
SODIUM: 132 meq/L — AB (ref 135–145)
Total Protein: 7.3 g/dL (ref 6.0–8.3)

## 2014-08-12 LAB — LIPID PANEL
CHOL/HDL RATIO: 10
CHOLESTEROL: 246 mg/dL — AB (ref 0–200)
HDL: 24 mg/dL — AB (ref >39.00)
NonHDL: 222
VLDL: 209.2 mg/dL — AB (ref 0.0–40.0)

## 2014-08-12 LAB — HEMOGLOBIN A1C: Hgb A1c MFr Bld: 10.7 % — ABNORMAL HIGH (ref 4.6–6.5)

## 2014-08-14 LAB — LDL CHOLESTEROL, DIRECT: Direct LDL: 79.8 mg/dL

## 2014-08-18 ENCOUNTER — Ambulatory Visit: Payer: Commercial Managed Care - HMO | Admitting: Endocrinology

## 2014-08-19 ENCOUNTER — Encounter (HOSPITAL_COMMUNITY): Payer: Self-pay | Admitting: Cardiovascular Disease

## 2014-08-24 ENCOUNTER — Ambulatory Visit: Payer: Commercial Managed Care - HMO | Admitting: Dietician

## 2014-08-25 ENCOUNTER — Other Ambulatory Visit: Payer: Self-pay | Admitting: Family Medicine

## 2014-09-15 ENCOUNTER — Ambulatory Visit: Payer: Commercial Managed Care - HMO | Admitting: Endocrinology

## 2014-09-17 ENCOUNTER — Telehealth: Payer: Self-pay

## 2014-09-17 ENCOUNTER — Ambulatory Visit: Payer: Commercial Managed Care - HMO | Admitting: Endocrinology

## 2014-09-17 ENCOUNTER — Telehealth: Payer: Self-pay | Admitting: Family Medicine

## 2014-09-17 NOTE — Telephone Encounter (Signed)
Patient's wife called and states that her husband needs a few medications refilled however he is unable to come in and for an OV. Wife wants to know can he have enough to last him until he comes in again. When I asked wife which medications the patient needs refilled, patient shouted from background "all of them". [place].   [phone]

## 2014-09-17 NOTE — Telephone Encounter (Signed)
Patient walks in today to pick up some Ranexa samples. He told a secreatary that he is having chest pain at the present time. Has been having it for about 3 days now, off and on. Rates it an 8 on the pain scale. No Sob, no syncope, no radiation, no nausea, vomiting. Color is good. Skin warm and dry. States he has not missed any cardiac meds. Spoke with Melina Copa PA, who recommends he go directly to ED with his cardiac history. His wife is here with him, and he is agreeable to going to [facility] ED.

## 2014-09-17 NOTE — Telephone Encounter (Signed)
Patient called to get samples of Ranexa placed a month supply up front

## 2014-09-18 ENCOUNTER — Emergency Department (HOSPITAL_COMMUNITY): Payer: Medicare HMO

## 2014-09-18 ENCOUNTER — Encounter (HOSPITAL_COMMUNITY): Payer: Self-pay | Admitting: Emergency Medicine

## 2014-09-18 ENCOUNTER — Inpatient Hospital Stay (HOSPITAL_COMMUNITY)
Admission: EM | Admit: 2014-09-18 | Discharge: 2014-09-19 | DRG: 303 | Disposition: A | Discharge status: Home / Self Care | Payer: Medicare HMO | Attending: Cardiology | Admitting: Cardiology

## 2014-09-18 DIAGNOSIS — Z841 Family history of disorders of kidney and ureter: Secondary | ICD-10-CM

## 2014-09-18 DIAGNOSIS — I252 Old myocardial infarction: Secondary | ICD-10-CM

## 2014-09-18 DIAGNOSIS — Z951 Presence of aortocoronary bypass graft: Secondary | ICD-10-CM

## 2014-09-18 DIAGNOSIS — Z888 Allergy status to other drugs, medicaments and biological substances status: Secondary | ICD-10-CM | POA: Diagnosis not present

## 2014-09-18 DIAGNOSIS — I129 Hypertensive chronic kidney disease with stage 1 through stage 4 chronic kidney disease, or unspecified chronic kidney disease: Secondary | ICD-10-CM | POA: Diagnosis not present

## 2014-09-18 DIAGNOSIS — I25700 Atherosclerosis of coronary artery bypass graft(s), unspecified, with unstable angina pectoris: Secondary | ICD-10-CM | POA: Diagnosis not present

## 2014-09-18 DIAGNOSIS — G4733 Obstructive sleep apnea (adult) (pediatric): Secondary | ICD-10-CM | POA: Diagnosis present

## 2014-09-18 DIAGNOSIS — Z883 Allergy status to other anti-infective agents status: Secondary | ICD-10-CM

## 2014-09-18 DIAGNOSIS — N183 Chronic kidney disease, stage 3 (moderate): Secondary | ICD-10-CM | POA: Diagnosis not present

## 2014-09-18 DIAGNOSIS — I2510 Atherosclerotic heart disease of native coronary artery without angina pectoris: Secondary | ICD-10-CM

## 2014-09-18 DIAGNOSIS — Z9114 Patient's other noncompliance with medication regimen: Secondary | ICD-10-CM | POA: Diagnosis present

## 2014-09-18 DIAGNOSIS — R079 Chest pain, unspecified: Secondary | ICD-10-CM

## 2014-09-18 DIAGNOSIS — E781 Pure hyperglyceridemia: Secondary | ICD-10-CM

## 2014-09-18 DIAGNOSIS — E1165 Type 2 diabetes mellitus with hyperglycemia: Secondary | ICD-10-CM | POA: Diagnosis not present

## 2014-09-18 DIAGNOSIS — I200 Unstable angina: Secondary | ICD-10-CM | POA: Diagnosis not present

## 2014-09-18 DIAGNOSIS — Z833 Family history of diabetes mellitus: Secondary | ICD-10-CM

## 2014-09-18 DIAGNOSIS — E785 Hyperlipidemia, unspecified: Secondary | ICD-10-CM | POA: Diagnosis present

## 2014-09-18 DIAGNOSIS — Z881 Allergy status to other antibiotic agents status: Secondary | ICD-10-CM | POA: Diagnosis not present

## 2014-09-18 DIAGNOSIS — Z9119 Patient's noncompliance with other medical treatment and regimen: Secondary | ICD-10-CM

## 2014-09-18 DIAGNOSIS — I152 Hypertension secondary to endocrine disorders: Secondary | ICD-10-CM | POA: Diagnosis present

## 2014-09-18 DIAGNOSIS — E860 Dehydration: Secondary | ICD-10-CM | POA: Diagnosis present

## 2014-09-18 DIAGNOSIS — Z8249 Family history of ischemic heart disease and other diseases of the circulatory system: Secondary | ICD-10-CM

## 2014-09-18 DIAGNOSIS — E1169 Type 2 diabetes mellitus with other specified complication: Secondary | ICD-10-CM | POA: Diagnosis present

## 2014-09-18 DIAGNOSIS — E0829 Diabetes mellitus due to underlying condition with other diabetic kidney complication: Secondary | ICD-10-CM

## 2014-09-18 DIAGNOSIS — E1121 Type 2 diabetes mellitus with diabetic nephropathy: Secondary | ICD-10-CM | POA: Diagnosis present

## 2014-09-18 DIAGNOSIS — Z794 Long term (current) use of insulin: Secondary | ICD-10-CM | POA: Diagnosis not present

## 2014-09-18 DIAGNOSIS — I10 Essential (primary) hypertension: Secondary | ICD-10-CM

## 2014-09-18 DIAGNOSIS — Z6841 Body Mass Index (BMI) 40.0 and over, adult: Secondary | ICD-10-CM

## 2014-09-18 DIAGNOSIS — Z801 Family history of malignant neoplasm of trachea, bronchus and lung: Secondary | ICD-10-CM | POA: Diagnosis not present

## 2014-09-18 DIAGNOSIS — E669 Obesity, unspecified: Secondary | ICD-10-CM | POA: Diagnosis present

## 2014-09-18 DIAGNOSIS — I25810 Atherosclerosis of coronary artery bypass graft(s) without angina pectoris: Secondary | ICD-10-CM

## 2014-09-18 DIAGNOSIS — Z9989 Dependence on other enabling machines and devices: Secondary | ICD-10-CM

## 2014-09-18 DIAGNOSIS — Z91199 Patient's noncompliance with other medical treatment and regimen due to unspecified reason: Secondary | ICD-10-CM

## 2014-09-18 LAB — CBC
HCT: 35.4 % — ABNORMAL LOW (ref 39.0–52.0)
HCT: 34.2 % — ABNORMAL LOW (ref 39.0–52.0)
HEMOGLOBIN: 11.4 g/dL — AB (ref 13.0–17.0)
Hemoglobin: 11.9 g/dL — ABNORMAL LOW (ref 13.0–17.0)
MCH: 28.1 pg (ref 26.0–34.0)
MCH: 27.6 pg (ref 26.0–34.0)
MCHC: 33.6 g/dL (ref 30.0–36.0)
MCHC: 33.3 g/dL (ref 30.0–36.0)
MCV: 83.5 fL (ref 78.0–100.0)
MCV: 82.8 fL (ref 78.0–100.0)
PLATELETS: 179 10*3/uL (ref 150–400)
Platelets: 188 K/uL (ref 150–400)
RBC: 4.24 MIL/uL (ref 4.22–5.81)
RBC: 4.13 MIL/uL — ABNORMAL LOW (ref 4.22–5.81)
RDW: 13.7 % (ref 11.5–15.5)
RDW: 13.6 % (ref 11.5–15.5)
WBC: 6.3 K/uL (ref 4.0–10.5)
WBC: 6.1 10*3/uL (ref 4.0–10.5)

## 2014-09-18 LAB — BASIC METABOLIC PANEL
Anion gap: 12 (ref 5–15)
BUN: 28 mg/dL — ABNORMAL HIGH (ref 6–23)
CO2: 21 mmol/L (ref 19–32)
Calcium: 9.3 mg/dL (ref 8.4–10.5)
Chloride: 103 mEq/L (ref 96–112)
Creatinine, Ser: 2.22 mg/dL — ABNORMAL HIGH (ref 0.50–1.35)
GFR calc non Af Amer: 32 mL/min — ABNORMAL LOW (ref >90)
GFR, EST AFRICAN AMERICAN: 37 mL/min — AB (ref >90)
Glucose, Bld: 246 mg/dL — ABNORMAL HIGH (ref 70–99)
Potassium: 4.5 mmol/L (ref 3.5–5.1)
Sodium: 136 mmol/L (ref 135–145)

## 2014-09-18 LAB — TROPONIN I
Troponin I: <0.03 ng/mL (ref <0.031)
Troponin I: <0.03 ng/mL (ref <0.031)

## 2014-09-18 LAB — GLUCOSE, CAPILLARY
GLUCOSE-CAPILLARY: 164 mg/dL — AB (ref 70–99)
Glucose-Capillary: 237 mg/dL — ABNORMAL HIGH (ref 70–99)
Glucose-Capillary: 233 mg/dL — ABNORMAL HIGH (ref 70–99)
Glucose-Capillary: 241 mg/dL — ABNORMAL HIGH (ref 70–99)

## 2014-09-18 LAB — BRAIN NATRIURETIC PEPTIDE: B Natriuretic Peptide: 120 pg/mL — ABNORMAL HIGH (ref 0.0–100.0)

## 2014-09-18 LAB — I-STAT TROPONIN, ED: Troponin i, poc: 0 ng/mL (ref 0.00–0.08)

## 2014-09-18 LAB — HEMOGLOBIN A1C
HEMOGLOBIN A1C: 9.7 % — AB (ref <5.7)
Mean Plasma Glucose: 232 mg/dL — ABNORMAL HIGH (ref <117)

## 2014-09-18 MED ORDER — RANOLAZINE ER 500 MG PO TB12
1000.0000 mg | ORAL_TABLET | Freq: Two times a day (BID) | ORAL | Status: DC
  Administered 2014-09-18 – 2014-09-19 (×3): 1000 mg via ORAL
  Filled 2014-09-18 (×3): qty 2

## 2014-09-18 MED ORDER — LIVING WELL WITH DIABETES BOOK
Freq: Once | Status: AC
  Administered 2014-09-18: 17:00:00
  Filled 2014-09-18: qty 1

## 2014-09-18 MED ORDER — HEPARIN SODIUM (PORCINE) 5000 UNIT/ML IJ SOLN
5000.0000 [IU] | Freq: Three times a day (TID) | INTRAMUSCULAR | Status: DC
  Filled 2014-09-18: qty 1

## 2014-09-18 MED ORDER — NITROGLYCERIN 0.4 MG SL SUBL: 0.4000 mg | SUBLINGUAL_TABLET | SUBLINGUAL | Status: DC | PRN

## 2014-09-18 MED ORDER — MORPHINE SULFATE 2 MG/ML IJ SOLN
2.0000 mg | Freq: Once | INTRAMUSCULAR | Status: AC
  Administered 2014-09-18: 2 mg via INTRAVENOUS
  Filled 2014-09-18: qty 1

## 2014-09-18 MED ORDER — ISOSORBIDE MONONITRATE ER 60 MG PO TB24
90.0000 mg | ORAL_TABLET | Freq: Every day | ORAL | Status: DC
  Administered 2014-09-18: 90 mg via ORAL
  Filled 2014-09-18 (×2): qty 1

## 2014-09-18 MED ORDER — NITROGLYCERIN 0.4 MG SL SUBL
0.4000 mg | SUBLINGUAL_TABLET | SUBLINGUAL | Status: DC | PRN
  Administered 2014-09-18: 0.4 mg via SUBLINGUAL
  Filled 2014-09-18: qty 1

## 2014-09-18 MED ORDER — DIPHENHYDRAMINE HCL 25 MG PO CAPS: 50.0000 mg | ORAL_CAPSULE | Freq: Every day | ORAL | Status: DC

## 2014-09-18 MED ORDER — ACETAMINOPHEN 500 MG PO TABS: 1000.0000 mg | ORAL_TABLET | Freq: Every day | ORAL | Status: DC

## 2014-09-18 MED ORDER — ISOSORBIDE MONONITRATE ER 60 MG PO TB24
120.0000 mg | ORAL_TABLET | Freq: Every day | ORAL | Status: DC
  Administered 2014-09-19: 120 mg via ORAL
  Filled 2014-09-18: qty 2

## 2014-09-18 MED ORDER — ASPIRIN 325 MG PO TABS: 81.0000 mg | ORAL_TABLET | Freq: Every day | ORAL | Status: DC

## 2014-09-18 MED ORDER — ACTIVE PARTNERSHIP FOR HEALTH OF YOUR HEART BOOK
Freq: Once | Status: AC
  Administered 2014-09-18: 17:00:00
  Filled 2014-09-18: qty 1

## 2014-09-18 MED ORDER — ONDANSETRON HCL 4 MG/2ML IJ SOLN
4.0000 mg | Freq: Four times a day (QID) | INTRAMUSCULAR | Status: DC | PRN
  Administered 2014-09-18: 4 mg via INTRAVENOUS
  Filled 2014-09-18: qty 2

## 2014-09-18 MED ORDER — CLOPIDOGREL BISULFATE 75 MG PO TABS
75.0000 mg | ORAL_TABLET | Freq: Every day | ORAL | Status: DC
  Administered 2014-09-18 – 2014-09-19 (×2): 75 mg via ORAL
  Filled 2014-09-18 (×2): qty 1

## 2014-09-18 MED ORDER — INSULIN ASPART 100 UNIT/ML ~~LOC~~ SOLN
0.0000 [IU] | Freq: Three times a day (TID) | SUBCUTANEOUS | Status: DC
  Administered 2014-09-18 – 2014-09-19 (×5): 5 [IU] via SUBCUTANEOUS

## 2014-09-18 MED ORDER — INSULIN ASPART 100 UNIT/ML ~~LOC~~ SOLN: 0.0000 [IU] | Freq: Every day | SUBCUTANEOUS | Status: DC

## 2014-09-18 MED ORDER — PANTOPRAZOLE SODIUM 40 MG PO TBEC
40.0000 mg | DELAYED_RELEASE_TABLET | Freq: Two times a day (BID) | ORAL | Status: DC
  Administered 2014-09-18 – 2014-09-19 (×3): 40 mg via ORAL
  Filled 2014-09-18 (×3): qty 1

## 2014-09-18 MED ORDER — NITROGLYCERIN IN D5W 200-5 MCG/ML-% IV SOLN: 10.0000 ug/min | INTRAVENOUS | Status: DC

## 2014-09-18 MED ORDER — ISOSORBIDE MONONITRATE ER 30 MG PO TB24
30.0000 mg | ORAL_TABLET | Freq: Once | ORAL | Status: AC
  Administered 2014-09-18: 30 mg via ORAL
  Filled 2014-09-18: qty 1

## 2014-09-18 MED ORDER — INSULIN ASPART 100 UNIT/ML ~~LOC~~ SOLN
10.0000 [IU] | Freq: Three times a day (TID) | SUBCUTANEOUS | Status: DC
  Administered 2014-09-18 – 2014-09-19 (×5): 10 [IU] via SUBCUTANEOUS

## 2014-09-18 MED ORDER — VITAMIN E 180 MG (400 UNIT) PO CAPS
1200.0000 [IU] | ORAL_CAPSULE | Freq: Every day | ORAL | Status: DC
  Administered 2014-09-18 – 2014-09-19 (×2): 1200 [IU] via ORAL
  Filled 2014-09-18 (×2): qty 3

## 2014-09-18 MED ORDER — LOSARTAN POTASSIUM 50 MG PO TABS
100.0000 mg | ORAL_TABLET | Freq: Every day | ORAL | Status: DC
  Administered 2014-09-18 – 2014-09-19 (×2): 100 mg via ORAL
  Filled 2014-09-18 (×2): qty 2

## 2014-09-18 MED ORDER — NITROGLYCERIN IN D5W 200-5 MCG/ML-% IV SOLN
10.0000 ug/min | INTRAVENOUS | Status: DC
  Administered 2014-09-18: 10 ug/min via INTRAVENOUS
  Filled 2014-09-18: qty 250

## 2014-09-18 MED ORDER — DIPHENHYDRAMINE-APAP (SLEEP) 25-500 MG PO TABS
2.0000 | ORAL_TABLET | Freq: Every day | ORAL | Status: DC
Start: 2014-09-18 — End: 2014-09-18

## 2014-09-18 MED ORDER — FENOFIBRATE 160 MG PO TABS
160.0000 mg | ORAL_TABLET | Freq: Every day | ORAL | Status: DC
  Administered 2014-09-18 – 2014-09-19 (×2): 160 mg via ORAL
  Filled 2014-09-18 (×2): qty 1

## 2014-09-18 MED ORDER — CARVEDILOL 25 MG PO TABS
25.0000 mg | ORAL_TABLET | Freq: Two times a day (BID) | ORAL | Status: DC
  Administered 2014-09-18 – 2014-09-19 (×3): 25 mg via ORAL
  Filled 2014-09-18 (×3): qty 1

## 2014-09-18 MED ORDER — INSULIN GLARGINE 100 UNIT/ML ~~LOC~~ SOLN
30.0000 [IU] | Freq: Every day | SUBCUTANEOUS | Status: DC
  Administered 2014-09-18 – 2014-09-19 (×2): 30 [IU] via SUBCUTANEOUS
  Filled 2014-09-18 (×2): qty 0.3

## 2014-09-18 MED ORDER — ZOLPIDEM TARTRATE 5 MG PO TABS
10.0000 mg | ORAL_TABLET | Freq: Every day | ORAL | Status: DC
  Administered 2014-09-19: 10 mg via ORAL
  Filled 2014-09-18: qty 2

## 2014-09-18 MED ORDER — ASPIRIN EC 81 MG PO TBEC
81.0000 mg | DELAYED_RELEASE_TABLET | Freq: Every day | ORAL | Status: DC
  Administered 2014-09-19: 81 mg via ORAL
  Filled 2014-09-18: qty 1

## 2014-09-18 MED ORDER — GABAPENTIN 300 MG PO CAPS
300.0000 mg | ORAL_CAPSULE | Freq: Three times a day (TID) | ORAL | Status: DC
  Administered 2014-09-18 – 2014-09-19 (×4): 300 mg via ORAL
  Filled 2014-09-18 (×4): qty 1

## 2014-09-18 MED ORDER — ADULT MULTIVITAMIN W/MINERALS CH
1.0000 | ORAL_TABLET | Freq: Every day | ORAL | Status: DC
  Administered 2014-09-18 – 2014-09-19 (×2): 1 via ORAL
  Filled 2014-09-18 (×2): qty 1

## 2014-09-18 MED ORDER — ALUM & MAG HYDROXIDE-SIMETH 200-200-20 MG/5ML PO SUSP
30.0000 mL | ORAL | Status: DC | PRN
  Administered 2014-09-18: 30 mL via ORAL
  Filled 2014-09-18: qty 30

## 2014-09-18 MED ORDER — AMLODIPINE BESYLATE 10 MG PO TABS
10.0000 mg | ORAL_TABLET | Freq: Every day | ORAL | Status: DC
  Administered 2014-09-18 – 2014-09-19 (×2): 10 mg via ORAL
  Filled 2014-09-18 (×2): qty 1

## 2014-09-18 MED ORDER — ACETAMINOPHEN 325 MG PO TABS
650.0000 mg | ORAL_TABLET | ORAL | Status: DC | PRN
  Administered 2014-09-18: 650 mg via ORAL
  Filled 2014-09-18: qty 2

## 2014-09-18 MED ORDER — PRAVASTATIN SODIUM 40 MG PO TABS
40.0000 mg | ORAL_TABLET | Freq: Every day | ORAL | Status: DC
  Administered 2014-09-18: 40 mg via ORAL
  Filled 2014-09-18: qty 1

## 2014-09-18 NOTE — ED Notes (Signed)
Second RN to attempt IV access, attempted x2 however access not achieved.

## 2014-09-18 NOTE — ED Notes (Signed)
Report attempted 

## 2014-09-18 NOTE — Plan of Care (Signed)
Problem: Phase I Progression Outcomes Goal: Anginal pain relieved Outcome: Progressing 2/10

## 2014-09-18 NOTE — Progress Notes (Signed)
Pt c/o H/A tylenol given, pt then reports diarrhea x 2 no bright red bleeding no dark black tarry, pt reports brown in color, instructed if diarrhea again we will send stool . Pt requesting med for bloating gas diarrhea, upset stomach, zofran and maalox given. Will continue to observe. Pt refuses heparin sq

## 2014-09-18 NOTE — Progress Notes (Signed)
Pt refuses heparin sq reports shot in abdomen is too painful and does not want any doses

## 2014-09-18 NOTE — ED Provider Notes (Signed)
CSN: [id number]     Arrival date & time 09/18/14  0203 History  This chart was scribed for Wandra Arthurs, MD by Delphia Grates, ED Scribe. This patient was seen in room A01C/A01C and the patient's care was started at 2:29 AM.   Chief Complaint  Patient presents with  . Chest Pain    The history is provided by the patient. No language interpreter was used.     HPI Comments: Matthew Daney. is a 54 y.o. male, with history of CAD, HTN, and CP, who presents to the Emergency Department complaining of worsening central chest pain that began 2 days ago. Patient states he was unable to by his cardiologist, Dr. Kirk Ruths, after his chest pain worsened today. He reports taking 1 nitroglycerin approximately 10.5 hours ago at 1600. He notes his pain later progressed and began to radiate to his left arm. He states he then took another nitroglycerin approximately 1 hour ago after waking to worsening chest pain that radiates to the left arm without relief. Patient rates his pain as a 8/10 currently and notes associated SOB. Patient has stents placed and reports his last stress test and catheterization was in March 2015. He has multiple stents and had CABG in the past. Patient has history of DM and reports his current blood glucose lever is 175. He states he took 15cc of 70-30 insulin after eating salad today and took a little more after eating a "few fries". He denies nausea or diaphoresis.   Past Medical History  Diagnosis Date  . Chest pain     Chronic  . Dyslipidemia   . Coronary artery disease     a. INF MI 1999: PCI of RCA;  b.  extensive stenting of LAD;  c. s/p CABG with RIMA->RCA in 2006;  d. Stable, Low risk MV 05/2012; e. 09/2012 Cath: stable anatomy->med Rx. f. Abnl nuc 11/2013 -> med rx; g. 11/2013 Cath/PCI: LM 30d, LAD 50ost, 20/50isr, 100d, LCX 40-50ost, OM2 50ost, RI 70-80ost sup branch, RCA 20p, 90/60d(2.25x17 & 2.25x24 Promus DES'), PDA 60p, RIMA->RCA ok.  . Hypertension   . Hx of  echocardiogram     a. Echo 3/12:  mild LVH, EF 55-60%, trivial AI  . DM2 (diabetes mellitus, type 2)   . History of blood transfusion ?2011  . GERD (gastroesophageal reflux disease)     "once; came to hospital" (09/15/2012)  . OSA on CPAP   . CKD (chronic kidney disease)   . Polycystic kidney disease    Past Surgical History  Procedure Laterality Date  . Cystectomy  1990's    off face  . Coronary angioplasty with stent placement  1999 / 2002 / 2004 / 2006?  Marland Kitchen Cardiac catheterization  2012  . Coronary artery bypass graft  2006    CABG X?; in [place]  . Cardiac catheterization  2013  . Cardiac catheterization  2014    LHC (1/14): total occlusion of distal LAD, diffuse disease in ramus, 70% proximal LCx, 50% proximal and mid RCA, 50% distal RCA, patent RIMA-RCA.  Marland Kitchen Left heart catheterization with coronary/graft angiogram N/A 09/16/2012    Procedure: LEFT HEART CATHETERIZATION WITH [name];  Surgeon: [name], MD;  Location: [facility] CATH LAB;  Service: Cardiovascular;  Laterality: N/A;  . Left heart catheterization with coronary/graft angiogram N/A 12/02/2013    Procedure: LEFT HEART CATHETERIZATION WITH [name];  Surgeon: [name], MD;  Location: [facility] CATH LAB;  Service: Cardiovascular;  Laterality: N/A;  . Percutaneous  coronary stent intervention (pci-s) N/A 12/03/2013    Procedure: PERCUTANEOUS CORONARY STENT INTERVENTION (PCI-S);  Surgeon: [name], MD;  Location: [facility] CATH LAB;  Service: Cardiovascular;  Laterality: N/A;   Family History  Problem Relation Age of Onset  . Lung cancer Mother   . Anemia Mother   . Polycystic kidney disease Mother   . Diabetes Father   . Heart attack Father 4    Died suddenly  . Heart failure Father   . Hyperlipidemia Father   . Hypertension Father   . Kidney failure Sister 5    Died  . Heart attack Sister   . Hypertension Sister   . Diabetes Sister   . Diabetes Brother   . Polycystic kidney  disease Brother   . Diabetes Sister    History  Substance Use Topics  . Smoking status: Never Smoker   . Smokeless tobacco: Never Used     Comment: smoked some as a teenager (high school)  . Alcohol Use: No     Comment: 09/15/2012 "drank a little when I was young"    Review of Systems  Respiratory: Positive for shortness of breath.   Cardiovascular: Positive for chest pain.  All other systems reviewed and are negative.     Allergies  Ciprocin-fluocin-procin; Clarithromycin; Cleocin; and Glimepiride  Home Medications   Prior to Admission medications   Medication Sig Start Date End Date Taking? Authorizing Provider  ACCU-CHEK SOFTCLIX LANCETS lancets Use as instructed to check blood sugar 4 times per day dx code E.11.9 07/01/14   Elayne Snare, MD  amLODipine (NORVASC) 10 MG tablet Take 10 mg by mouth daily.    Historical Provider, MD  amLODipine (NORVASC) 10 MG tablet Take 1 tablet (10 mg total) by mouth daily. PATIENT NEEDS OFFICE VISIT FOR ADDITIONAL REFILLS 08/26/14   Darreld Mclean, MD  aspirin 81 MG tablet Take 1 tablet (81 mg total) by mouth daily. 12/04/13   Rogelia Mire, NP  B-D UF III MINI PEN NEEDLES 31G X 5 MM MISC USE ONE PENNEEDLE DAILY FOR INSULIN INJECTION. 06/01/14   Mancel Bale, PA-C  carvedilol (COREG) 25 MG tablet 2 tablets (50mg ) two times a day 07/07/14   Larey Dresser, MD  clopidogrel (PLAVIX) 75 MG tablet Take 1 tablet (75 mg total) by mouth daily with breakfast. 12/25/13   Gay Filler Copland, MD  diphenhydramine-acetaminophen (TYLENOL PM) 25-500 MG TABS Take 2 tablets by mouth at bedtime.    Historical Provider, MD  fenofibrate (TRICOR) 145 MG tablet Take 1 tablet (145 mg total) by mouth daily. 04/12/14   Larey Dresser, MD  gabapentin (NEURONTIN) 300 MG capsule Take 1 capsule (300 mg total) by mouth 3 (three) times daily. 12/25/13   Gay Filler Copland, MD  glucose blood (ACCU-CHEK AVIVA PLUS) test strip Use as instructed to check blood sugar 4 times per  day dx code E11.9 07/01/14   Elayne Snare, MD  hydrochlorothiazide (HYDRODIURIL) 25 MG tablet  04/12/14   Historical Provider, MD  Insulin Glargine 300 UNIT/ML SOPN Inject 20 Units into the skin daily. Uses 20-30 units daily patient states he takes this 3 times a day 05/03/14   Darreld Mclean, MD  insulin NPH-regular Human (NOVOLIN 70/30 RELION) (70-30) 100 UNIT/ML injection Inject 15 Units into the skin 3 (three) times daily. 08/10/14   Elayne Snare, MD  Insulin Syringe-Needle U-100 (INSULIN SYRINGE .5CC/31GX5/16") 31G X 5/16" 0.5 ML MISC Use one needle three times per day 05/26/14  Elayne Snare, MD  isosorbide mononitrate (IMDUR) 30 MG 24 hr tablet Take 90 mg by mouth daily.    Historical Provider, MD  losartan (COZAAR) 100 MG tablet Take 1 tablet (100 mg total) by mouth daily. PATIENT NEEDS OFFICE VISIT FOR ADDITIONAL REFILLS 08/26/14   Darreld Mclean, MD  Multiple Vitamin (MULTIVITAMIN WITH MINERALS) TABS tablet Take 1 tablet by mouth daily.    Historical Provider, MD  nitroGLYCERIN (NITROSTAT) 0.4 MG SL tablet Place 1 tablet (0.4 mg total) under the tongue every 5 (five) minutes as needed for chest pain. 12/04/13   Rogelia Mire, NP  pantoprazole (PROTONIX) 40 MG tablet Take 1 tablet (40 mg total) by mouth 2 (two) times daily with a meal. 12/25/13 12/25/14  Gay Filler Copland, MD  pravastatin (PRAVACHOL) 40 MG tablet Take 40 mg by mouth daily.    Historical Provider, MD  ranolazine (RANEXA) 1000 MG SR tablet Take 1 tablet (1,000 mg total) by mouth 2 (two) times daily. 07/07/14   Larey Dresser, MD  vitamin E 400 UNIT capsule Take 1,200 Units by mouth daily.    Historical Provider, MD  zolpidem (AMBIEN) 10 MG tablet Take 1 tablet (10 mg total) by mouth at bedtime. 12/25/13   Darreld Mclean, MD   Triage Vitals: BP 168/87 mmHg  Pulse 76  Temp(Src) 97.8 F (36.6 C) (Oral)  Resp 22  Ht 5\' 5"  (1.651 m)  Wt 245 lb (111.131 kg)  BMI 40.77 kg/m2  SpO2 98%  Physical Exam  Constitutional: He is  oriented to person, place, and time. He appears well-developed and well-nourished. No distress.  HENT:  Head: Normocephalic and atraumatic.  Eyes: Conjunctivae and EOM are normal.  Neck: Neck supple. No tracheal deviation present.  Cardiovascular: Normal rate.   Pulmonary/Chest: Effort normal and breath sounds normal. No respiratory distress.  Musculoskeletal: Normal range of motion. He exhibits no edema.  No pedal edema.  Neurological: He is alert and oriented to person, place, and time.  Skin: Skin is warm and dry.  Psychiatric: He has a normal mood and affect. His behavior is normal.  Nursing note and vitals reviewed.   ED Course  Procedures (including critical care time)  CRITICAL CARE Performed by: Darl Householder, Zamora Colton   Total critical care time: 30 min   Critical care time was exclusive of separately billable procedures and treating other patients.  Critical care was necessary to treat or prevent imminent or life-threatening deterioration.  Critical care was time spent personally by me on the following activities: development of treatment plan with patient and/or surrogate as well as nursing, discussions with consultants, evaluation of patient's response to treatment, examination of patient, obtaining history from patient or surrogate, ordering and performing treatments and interventions, ordering and review of laboratory studies, ordering and review of radiographic studies, pulse oximetry and re-evaluation of patient's condition.   DIAGNOSTIC STUDIES: Oxygen Saturation is 98% on room air, normal by my interpretation.    COORDINATION OF CARE: At 0234 Discussed treatment plan with patient which includes interpretation of imaging and lab results. Patient agrees.   Labs Review Labs Reviewed  CBC - Abnormal; Notable for the following:    Hemoglobin 11.9 (*)    HCT 35.4 (*)    All other components within normal limits  BASIC METABOLIC PANEL - Abnormal; Notable for the following:     Glucose, Bld 246 (*)    BUN 28 (*)    Creatinine, Ser 2.22 (*)    GFR calc non Af  Amer 32 (*)    GFR calc Af Amer 37 (*)    All other components within normal limits  BRAIN NATRIURETIC PEPTIDE - Abnormal; Notable for the following:    B Natriuretic Peptide 120.0 (*)    All other components within normal limits  I-STAT TROPOININ, ED    Imaging Review Dg Chest 2 View  09/18/2014   CLINICAL DATA:  Coronary disease, chest pain.  Left-sided chest pain  EXAM: CHEST  2 VIEW  COMPARISON:  None.  FINDINGS: Normal mediastinum and cardiac silhouette. Normal pulmonary vasculature. No evidence of effusion, infiltrate, or pneumothorax. No acute bony abnormality.  IMPRESSION: No acute cardiopulmonary process.   Electronically Signed   By: Suzy Bouchard M.D.   On: 09/18/2014 03:06     EKG Interpretation None      MDM   Final diagnoses:  Chest pain    Matthew Skipton. is a 54 y.o. male here with chest pain. High risk for ACS. Will get labs, trop. Will give nitro. Likely admit.   4:05 AM Still has pain despite nitro. Trop neg x 1. Start on nitro drip. Hold heparin given neg trop. Will admit.    I personally performed the services described in this documentation, which was scribed in my presence. The recorded information has been reviewed and is accurate.   Wandra Arthurs, MD 09/18/14 [phone]

## 2014-09-18 NOTE — Telephone Encounter (Signed)
Spoke with pt. Advised to contact pharm and have them send over RF requests for the meds that he needs.

## 2014-09-18 NOTE — ED Notes (Addendum)
Pt. reports central chest pain / pressure with SOB onset 2 days ago , denies nausea or diaphoresis , no cough or congestion . Pt. took 1 NTG sl prior to arrival with no relief.

## 2014-09-18 NOTE — H&P (Signed)
HPI: [name] is a 67M with CAD s/p MI, CABG (RIMA-->RCA) and PCI of RCA and LAD, HTN, poorly controlled DM Type II on insulin, HL, OSA and CKD here with chest pain.  The pain began 2 nights prior to presentation when he was awakened with 8/10 chest pressure.  The pain was non-exertional and radiated down the R arm.  He denies associated SOB, nausea, vomiting or diaphoresis.  The first night he took 4 nitroglycerin, which eventually improved the CP.  However, last night he did not have any more nitro.  He thinks the pain may be due to his elevated glucose and the fact that he is missing several cardiac meds. He has not taken amlodipine, or losartan in 3 weeks due to being in the donut hole in his insurance.  His glucose has been poorly controlled since his lantus insulin was switched to 70/30.  He has been working with his Endocrinologist to adjust the dose but has been using higher doses than prescribed to get the glucose <200.  However, when doing so he runs out of insulin and then has several days with glucose >400.  Recently he endorses polydipsia and polyuria and avg glu around 300.  [name] presented to the ED due to the persistent CP.  In the ED his initial VS were BP 168/87 and HR 76.  His initial cardiac enzymes were negative and ECG did not reveal ischemic changes.  He was started on a nitroglycerin infusion and cardiology was consulted.  [name] last cardiac catheterization was 11/2013 where he was found to have significant 2 vessel disease with instent restenosis throughout the LAD, distal occlusion of the LAD and hgih-grade distal RCA stenosis after the RIMA anastamosis.  They were unable to PCI the RCA as they could not engage the vessel with the guiding catheter via either the radial or femoral approaches.  He has an anomolous RCA off the L cusp.    Review of Systems:     Cardiac Review of Systems: {Y] = yes [ ]  = no  Chest Pain [x    ]  Resting SOB [  x ] Exertional SOB  [  ]    Orthopnea [  ]   Pedal Edema [   ]    Palpitations [  ] Syncope  [  ]   Presyncope [   ]  General Review of Systems: [Y] = yes [  ]=no Constitional: recent weight change [  ]; anorexia [  ]; fatigue [  ]; nausea [  ]; night sweats [  ]; fever [  ]; or chills [  ];                                                                                                                                          Dental: poor dentition[  ];   Eye :  blurred vision [  ]; diplopia [   ]; vision changes [  ];  Amaurosis fugax[  ]; Resp: cough [  ];  wheezing[  ];  hemoptysis[  ]; shortness of breath[  ]; paroxysmal nocturnal dyspnea[  ]; dyspnea on exertion[  ]; or orthopnea[  ];  GI:  gallstones[  ], vomiting[  ];  dysphagia[  ]; melena[  ];  hematochezia [  ]; heartburn[  ];   Hx of  Colonoscopy[  ]; GU: kidney stones [  ]; hematuria[  ];   dysuria [  ];  nocturia[  ];  history of     obstruction [  ];                 Skin: rash, swelling[  ];, hair loss[  ];  peripheral edema[  ];  or itching[  ]; Musculosketetal: myalgias[  ];  joint swelling[  ];  joint erythema[  ];  joint pain[  ];  back pain[  ];  Heme/Lymph: bruising[  ];  bleeding[  ];  anemia[  ];  Neuro: TIA[  ];  headaches[  ];  stroke[  ];  vertigo[  ];  seizures[  ];   paresthesias[  ];  difficulty walking[  ];  Psych:depression[  ]; anxiety[  ];  Endocrine: diabetes[x ];  thyroid dysfunction[  ];  Immunizations: Flu [  ]; Pneumococcal[  ];  Other:  Past Medical History  Diagnosis Date  . Chest pain     Chronic  . Dyslipidemia   . Coronary artery disease     a. INF MI 1999: PCI of RCA;  b.  extensive stenting of LAD;  c. s/p CABG with RIMA->RCA in 2006;  d. Stable, Low risk MV 05/2012; e. 09/2012 Cath: stable anatomy->med Rx. f. Abnl nuc 11/2013 -> med rx; g. 11/2013 Cath/PCI: LM 30d, LAD 50ost, 20/50isr, 100d, LCX 40-50ost, OM2 50ost, RI 70-80ost sup branch, RCA 20p, 90/60d(2.25x17 & 2.25x24 Promus DES'), PDA 60p, RIMA->RCA ok.  . Hypertension    . Hx of echocardiogram     a. Echo 3/12:  mild LVH, EF 55-60%, trivial AI  . DM2 (diabetes mellitus, type 2)   . History of blood transfusion ?2011  . GERD (gastroesophageal reflux disease)     "once; came to hospital" (09/15/2012)  . OSA on CPAP   . CKD (chronic kidney disease)   . Polycystic kidney disease      (Not in a hospital admission)   Allergies  Allergen Reactions  . Ciprocin-Fluocin-Procin [Fluocinolone Acetonide] Rash  . Clarithromycin Itching and Other (See Comments)    "Biaxin" Eyes itch and burn  . Cleocin [Clindamycin Hcl] Anaphylaxis and Swelling  . Glimepiride [Amaryl] Other (See Comments)    Elevates liver function     History   Social History  . Marital Status: Married    Spouse Name: Matthew Rowe    Number of Children: 5  . Years of Education: N/A   Occupational History  . retired     Chartered loss adjuster   Social History Main Topics  . Smoking status: Never Smoker   . Smokeless tobacco: Never Used     Comment: smoked some as a teenager (high school)  . Alcohol Use: No     Comment: 09/15/2012 "drank a little when I was young"  . Drug Use: No  . Sexual Activity: Yes   Other Topics Concern  . Not on file   Social History Narrative   Married and lives with  wife in [place]. On disability.     Family History  Problem Relation Age of Onset  . Lung cancer Mother   . Anemia Mother   . Polycystic kidney disease Mother   . Diabetes Father   . Heart attack Father 26    Died suddenly  . Heart failure Father   . Hyperlipidemia Father   . Hypertension Father   . Kidney failure Sister 5    Died  . Heart attack Sister   . Hypertension Sister   . Diabetes Sister   . Diabetes Brother   . Polycystic kidney disease Brother   . Diabetes Sister     PHYSICAL EXAM: Filed Vitals:   09/18/14 0445  BP: 167/95  Pulse: 73  Temp:   Resp: 26   General:  Well appearing. No respiratory difficulty HEENT: normal Neck: supple. no JVD. Carotids 2+ bilat; no  bruits. No lymphadenopathy or thryomegaly appreciated. Cor: PMI nondisplaced. Regular rate & rhythm. No rubs, gallops or murmurs. Lungs: clear Abdomen: soft, nontender, nondistended. No hepatosplenomegaly. No bruits or masses. Good bowel sounds. Extremities: no cyanosis, clubbing, rash, edema Neuro: alert & oriented x 3, cranial nerves grossly intact. moves all 4 extremities w/o difficulty. Affect pleasant.  ECG: Sinus at 78bpm.  L axis deviation.  L atrial enlargement.  No R wave progression.  Prior anterior and lateral MI.  Unchanged from 12/2013  Results for orders placed or performed during the hospital encounter of 09/18/14 (from the past 24 hour(s))  BNP (order ONLY if patient complains of dyspnea/SOB AND you have documented it for THIS visit)     Status: Abnormal   Collection Time: 09/18/14  2:20 AM  Result Value Ref Range   B Natriuretic Peptide 120.0 (H) 0.0 - 100.0 pg/mL  CBC     Status: Abnormal   Collection Time: 09/18/14  2:23 AM  Result Value Ref Range   WBC 6.3 4.0 - 10.5 K/uL   RBC 4.24 4.22 - 5.81 MIL/uL   Hemoglobin 11.9 (L) 13.0 - 17.0 g/dL   HCT 35.4 (L) 39.0 - 52.0 %   MCV 83.5 78.0 - 100.0 fL   MCH 28.1 26.0 - 34.0 pg   MCHC 33.6 30.0 - 36.0 g/dL   RDW 13.7 11.5 - 15.5 %   Platelets 188 150 - 400 K/uL  Basic metabolic panel     Status: Abnormal   Collection Time: 09/18/14  2:23 AM  Result Value Ref Range   Sodium 136 135 - 145 mmol/L   Potassium 4.5 3.5 - 5.1 mmol/L   Chloride 103 96 - 112 mEq/L   CO2 21 19 - 32 mmol/L   Glucose, Bld 246 (H) 70 - 99 mg/dL   BUN 28 (H) 6 - 23 mg/dL   Creatinine, Ser 2.22 (H) 0.50 - 1.35 mg/dL   Calcium 9.3 8.4 - 10.5 mg/dL   GFR calc non Af Amer 32 (L) >90 mL/min   GFR calc Af Amer 37 (L) >90 mL/min   Anion gap 12 5 - 15  I-stat troponin, ED (not at [facility])     Status: None   Collection Time: 09/18/14  2:26 AM  Result Value Ref Range   Troponin i, poc 0.00 0.00 - 0.08 ng/mL   Comment 3           Dg Chest 2  View  09/18/2014   CLINICAL DATA:  Coronary disease, chest pain.  Left-sided chest pain  EXAM: CHEST  2 VIEW  COMPARISON:  None.  FINDINGS: Normal mediastinum and cardiac silhouette. Normal pulmonary vasculature. No evidence of effusion, infiltrate, or pneumothorax. No acute bony abnormality.  IMPRESSION: No acute cardiopulmonary process.   Electronically Signed   By: Suzy Bouchard M.D.   On: 09/18/2014 03:06   11/2013  Coronary angiography: Coronary dominance: Right.    Left Main: Normal in size with 30 % distal stenosis.  Left Anterior Descending (LAD): Normal in size. Multiple stents are noted throughout the vessel. There is 50% ostial stenosis. There is diffuse 20% in-stent restenosis proximally. There is diffuse 50% in-stent restenosis in the mid and distal segment. The LAD is occluded distally.  1st diagonal (D1): Very small in size.  2nd diagonal (D2): Very small in size.  3rd diagonal (D3): Very small in size.  Circumflex (LCx): Small in size. There is 40-50% ostial stenosis.  1st obtuse marginal: Very small in size.  2nd obtuse marginal: Normal in size with 50% ostial stenosis.  3rd obtuse marginal: Normal in size with minor irregularities.  Ramus Intermedius: Large in size. This bifurcates into 2 branches. The superior branch has a 70-80% ostial stenosis. The rest of the vessel has minor irregularities.  Right Coronary Artery: Normal in size with takeoff from the left coronary cusp. There is diffuse 20% proximal stenosis. There is a teacher 90% distal stenosis after the anastomosis with RIMA . There is a 60% distal stenosis at the distal RCA extending into the right PDA.  Posterior descending artery: Normal in size with 60% proximal stenosis.  Posterior AV segment: Normal in size with 30% ostial stenosis.  Posterolateral branchs: Normal in size with minor irregularities.  RIMA to RCA: Patent. However, the insertion is proximal to the stenosis  site.  Left ventriculography: Was not performed.   PCI note: RCA PCI was attempted. The patient was given Angiomax with therapeutic ACT. 600 mg of Plavix was given. I then tried to engage the right coronary artery but was not able to engage with any guiding catheter including AL 2, AL1, AL 0.75 and multipurpose guiding catheter. Thus, the procedure was aborted especially with his known chronic kidney disease in order to fully high contrast load.   Final Conclusions:  1. Significant 2 vessel coronary artery disease with moderate instent restenosis throughout the LAD. The distal LAD is known to be occluded. High-grade distal RCA stenosis after the RIMA anastomosis which is new compared to most recent cardiac catheterization. There is also diffuse distal disease in the RCA extending into the right PDA but this does not seem to be different from most recent cardiac catheterization. 2. Mild to moderate elevation of ventricular end-diastolic pressure. 3. Unsuccessful RCA PCI due to inability to engage the vessel with a guiding catheter. 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  Recommendations:  RCA PCI via the femoral approach. Hydrate overnight given chronic kidney disease.  ASSESSMENT:  75M with CAD s/p MI, CABG (RIMA-->RCA) and PCI of RCA and LAD, HTN, poorly controlled DM Type II on insulin, HL, OSA and CKD here with unstable angina.   PLAN/DISCUSSION:  # Unstable angina/CAD: Likely multifactorial and unlikely ACS.  [name] glucose has been poorly controlled and he has been out of his cardiac medications including amlodipine and losartan.  He has known CAD that was not amenable to PCI 2/2 an anomalous take off of the RCA.   - Restart losartan - Continue carvedilol, fenofibrate, imdur, ranexa and pravastatin - would prefer atorvastatin or rosuvasating, but will not change due to cost  concerns - repeat cardiac enzymes x2 - consider stress if cardiac enzymes are  negative.  This may not be necessary as his symptoms seem to be provoked by missing medications and poor glucose control.  His disease was not amenable to intervention 11/2013.  He may benefit more from getting back on his medications and reassessing symptoms in 3 months.  His only revasuclarization strategy seems to be repeat CABG.  He was on an excellent CAD regimen when able to afford it.  # DM: [name] does not like the 70/30 insulin and prefers lantus with a short acting insulin.  He currently reports a daily insulin intake of 75 units.  He is currently prescribed 45 units of 70/30. - Start lantus 30 units with 10 tidac of aspart - Consider diabetes management/Endocrine consult - He requests assistance finding a new Endocrine provider - Check hgb A1c  # Code; Full

## 2014-09-18 NOTE — Progress Notes (Addendum)
Patient Name: Matthew BEECK Sr. Date of Encounter: 09/18/2014  Active Problems:   Unstable angina   Length of Stay: 0  SUBJECTIVE  The patient states that he feels better, no chest pain right now and improved SOB.   CURRENT MEDS . diphenhydrAMINE  50 mg Oral QHS   And  . acetaminophen  1,000 mg Oral QHS  . amLODipine  10 mg Oral Daily  . [START ON 09/19/2014] aspirin EC  81 mg Oral Daily  . carvedilol  25 mg Oral BID WC  . clopidogrel  75 mg Oral Q breakfast  . fenofibrate  160 mg Oral Daily  . gabapentin  300 mg Oral TID  . heparin  5,000 Units Subcutaneous 3 times per day  . insulin aspart  0-15 Units Subcutaneous TID WC  . insulin aspart  0-5 Units Subcutaneous QHS  . insulin aspart  10 Units Subcutaneous TID WC  . insulin glargine  30 Units Subcutaneous Daily  . isosorbide mononitrate  90 mg Oral Daily  . losartan  100 mg Oral Daily  . multivitamin with minerals  1 tablet Oral Daily  . pantoprazole  40 mg Oral BID WC  . pravastatin  40 mg Oral q1800  . ranolazine  1,000 mg Oral BID  . vitamin E  1,200 Units Oral Daily  . zolpidem  10 mg Oral QHS   . nitroGLYCERIN 25 mcg/min (09/18/14 0634)    OBJECTIVE  Filed Vitals:   09/18/14 0615 09/18/14 0655 09/18/14 0659 09/18/14 1146  BP: 148/82 143/85  146/80  Pulse: 71 69  72  Temp:  97.1 F (36.2 C)  98.5 F (36.9 C)  TempSrc:  Oral  Oral  Resp: 33   26  Height:  5\' 5"  (1.651 m) 5' 5.5" (1.664 m)   Weight:   245 lb 9.6 oz (111.403 kg)   SpO2: 98% 95%  95%    Intake/Output Summary (Last 24 hours) at 09/18/14 1232 Last data filed at 09/18/14 0813  Gross per 24 hour  Intake    260 ml  Output      0 ml  Net    260 ml   Filed Weights   09/18/14 0211 09/18/14 0659  Weight: 245 lb (111.131 kg) 245 lb 9.6 oz (111.403 kg)    PHYSICAL EXAM  General: Pleasant, NAD. Neuro: Alert and oriented X 3. Moves all extremities spontaneously. Psych: Normal affect. HEENT:  Normal  Neck: Supple without bruits or  JVD. Lungs:  Resp regular and unlabored, CTA. Heart: RRR no s3, s4, or murmurs. Abdomen: Soft, non-tender, non-distended, BS + x 4.  Extremities: No clubbing, cyanosis or edema. DP/PT/Radials 2+ and equal bilaterally.  Accessory Clinical Findings  CBC  Recent Labs  09/18/14 0223 09/18/14 0617  WBC 6.3 6.1  HGB 11.9* 11.4*  HCT 35.4* 34.2*  MCV 83.5 82.8  PLT 188 0000000   Basic Metabolic Panel  Recent Labs  09/18/14 0223  NA 136  K 4.5  CL 103  CO2 21  GLUCOSE 246*  BUN 28*  CREATININE 2.22*  CALCIUM 9.3   Cardiac Enzymes  Recent Labs  09/18/14 0617  TROPONINI <0.03   Radiology/Studies  Dg Chest 2 View  09/18/2014   CLINICAL DATA:  Coronary disease, chest pain.  IMPRESSION: No acute cardiopulmonary process.   Electronically Signed   By: Suzy Bouchard M.D.   On: 09/18/2014 03:06   TELE: SR  ECG:     ASSESSMENT AND PLAN  1. Unstable  angina/CAD: most probably sec to hypertensive urgency and poorly controlled glucose.. Troponin negative x 2, ECG unchanged. Now chest pain free, BP still elevated while on iv nitroglycerin.  - Continue carvedilol, restarted losartan, fenofibrate, imdur, ranexa and pravastatin - would prefer atorvastatin or rosuvasating, but will not change due to cost concerns - if BP controlled and he remains chest pain free I wouldn't proceed with further ischemic work up as he is CKD stage 3. - I will try to discontinue iv NTG, increase imdur to 120 mg PO daily  2. DM: Mr. Helmandollar does not like the 70/30 insulin and prefers lantus with a short acting insulin. He currently reports a daily insulin intake of 75 units. He is currently prescribed 45 units of 70/30. - Start lantus 30 units with 10 tidac of aspart - Consider diabetes management/Endocrine consult - He requests assistance finding a new Endocrine provider - we will try to arrange prior to the discharge - Check hgb A1c  3. Code; Full  Signed, Dorothy Spark MD,  [facility] 09/18/2014

## 2014-09-19 ENCOUNTER — Encounter (HOSPITAL_COMMUNITY): Payer: Self-pay | Admitting: Nurse Practitioner

## 2014-09-19 DIAGNOSIS — N179 Acute kidney failure, unspecified: Secondary | ICD-10-CM | POA: Diagnosis not present

## 2014-09-19 DIAGNOSIS — I25700 Atherosclerosis of coronary artery bypass graft(s), unspecified, with unstable angina pectoris: Secondary | ICD-10-CM | POA: Diagnosis not present

## 2014-09-19 DIAGNOSIS — E0821 Diabetes mellitus due to underlying condition with diabetic nephropathy: Secondary | ICD-10-CM

## 2014-09-19 DIAGNOSIS — N183 Chronic kidney disease, stage 3 (moderate): Secondary | ICD-10-CM | POA: Diagnosis not present

## 2014-09-19 DIAGNOSIS — Z6841 Body Mass Index (BMI) 40.0 and over, adult: Secondary | ICD-10-CM | POA: Diagnosis not present

## 2014-09-19 DIAGNOSIS — I200 Unstable angina: Secondary | ICD-10-CM | POA: Diagnosis not present

## 2014-09-19 DIAGNOSIS — E1165 Type 2 diabetes mellitus with hyperglycemia: Secondary | ICD-10-CM | POA: Diagnosis not present

## 2014-09-19 DIAGNOSIS — I10 Essential (primary) hypertension: Secondary | ICD-10-CM | POA: Diagnosis not present

## 2014-09-19 LAB — BASIC METABOLIC PANEL
ANION GAP: 7 (ref 5–15)
BUN: 33 mg/dL — AB (ref 6–23)
CO2: 23 mmol/L (ref 19–32)
CREATININE: 2.59 mg/dL — AB (ref 0.50–1.35)
Calcium: 9 mg/dL (ref 8.4–10.5)
Chloride: 104 mEq/L (ref 96–112)
GFR, EST AFRICAN AMERICAN: 31 mL/min — AB (ref >90)
GFR, EST NON AFRICAN AMERICAN: 26 mL/min — AB (ref >90)
Glucose, Bld: 278 mg/dL — ABNORMAL HIGH (ref 70–99)
Potassium: 5.5 mmol/L — ABNORMAL HIGH (ref 3.5–5.1)
SODIUM: 134 mmol/L — AB (ref 135–145)

## 2014-09-19 LAB — CBC
HEMATOCRIT: 34.8 % — AB (ref 39.0–52.0)
HEMOGLOBIN: 11.3 g/dL — AB (ref 13.0–17.0)
MCH: 27.4 pg (ref 26.0–34.0)
MCHC: 32.5 g/dL (ref 30.0–36.0)
MCV: 84.3 fL (ref 78.0–100.0)
Platelets: 183 10*3/uL (ref 150–400)
RBC: 4.13 MIL/uL — ABNORMAL LOW (ref 4.22–5.81)
RDW: 13.9 % (ref 11.5–15.5)
WBC: 7.5 10*3/uL (ref 4.0–10.5)

## 2014-09-19 LAB — GLUCOSE, CAPILLARY: Glucose-Capillary: 222 mg/dL — ABNORMAL HIGH (ref 70–99)

## 2014-09-19 LAB — LIPID PANEL
Cholesterol: 243 mg/dL — ABNORMAL HIGH (ref 0–200)
HDL: 30 mg/dL — ABNORMAL LOW (ref >39)
LDL CALC: UNDETERMINED mg/dL (ref 0–99)
TRIGLYCERIDES: 524 mg/dL — AB (ref <150)
Total CHOL/HDL Ratio: 8.1 RATIO
VLDL: UNDETERMINED mg/dL (ref 0–40)

## 2014-09-19 LAB — MRSA PCR SCREENING: MRSA by PCR: NEGATIVE

## 2014-09-19 LAB — CLOSTRIDIUM DIFFICILE BY PCR: Toxigenic C. Difficile by PCR: NEGATIVE

## 2014-09-19 MED ORDER — LOSARTAN POTASSIUM 100 MG PO TABS: 100.0000 mg | ORAL_TABLET | Freq: Every day | ORAL | Status: DC

## 2014-09-19 MED ORDER — PRAVASTATIN SODIUM 40 MG PO TABS: 40.0000 mg | ORAL_TABLET | Freq: Every day | ORAL | Status: DC

## 2014-09-19 MED ORDER — INSULIN GLARGINE 100 UNIT/ML ~~LOC~~ SOLN: 30.0000 [IU] | Freq: Every day | SUBCUTANEOUS | Status: DC

## 2014-09-19 MED ORDER — FENOFIBRATE 145 MG PO TABS: 145.0000 mg | ORAL_TABLET | Freq: Every day | ORAL | Status: DC

## 2014-09-19 MED ORDER — INSULIN GLARGINE 300 UNIT/ML ~~LOC~~ SOPN: 30.0000 [IU] | PEN_INJECTOR | Freq: Every day | SUBCUTANEOUS | Status: DC

## 2014-09-19 MED ORDER — AMLODIPINE BESYLATE 10 MG PO TABS: 10.0000 mg | ORAL_TABLET | Freq: Every day | ORAL | Status: DC

## 2014-09-19 MED ORDER — NITROGLYCERIN 0.4 MG SL SUBL: 0.4000 mg | SUBLINGUAL_TABLET | SUBLINGUAL | Status: DC | PRN

## 2014-09-19 MED ORDER — ISOSORBIDE MONONITRATE ER 120 MG PO TB24: 120.0000 mg | ORAL_TABLET | Freq: Every day | ORAL | Status: DC

## 2014-09-19 MED ORDER — RANOLAZINE ER 1000 MG PO TB12: 1000.0000 mg | ORAL_TABLET | Freq: Two times a day (BID) | ORAL | Status: DC

## 2014-09-19 MED ORDER — INSULIN PEN NEEDLE 31G X 5 MM MISC: Status: DC

## 2014-09-19 NOTE — Discharge Instructions (Signed)
***  PLEASE REMEMBER TO BRING ALL OF YOUR MEDICATIONS TO EACH OF YOUR FOLLOW-UP OFFICE VISITS.  

## 2014-09-19 NOTE — Progress Notes (Addendum)
Patient Name: Matthew DUHART Sr. Date of Encounter: 09/19/2014  Active Problems:   Unstable angina   Length of Stay: 1  SUBJECTIVE  The patient states that he feels better, no chest pain right now and improved SOB. Diarrhea has resolved.   CURRENT MEDS . diphenhydrAMINE  50 mg Oral QHS   And  . acetaminophen  1,000 mg Oral QHS  . amLODipine  10 mg Oral Daily  . aspirin EC  81 mg Oral Daily  . carvedilol  25 mg Oral BID WC  . clopidogrel  75 mg Oral Q breakfast  . fenofibrate  160 mg Oral Daily  . gabapentin  300 mg Oral TID  . heparin  5,000 Units Subcutaneous 3 times per day  . insulin aspart  0-15 Units Subcutaneous TID WC  . insulin aspart  0-5 Units Subcutaneous QHS  . insulin aspart  10 Units Subcutaneous TID WC  . insulin glargine  30 Units Subcutaneous Daily  . isosorbide mononitrate  120 mg Oral Daily  . losartan  100 mg Oral Daily  . multivitamin with minerals  1 tablet Oral Daily  . pantoprazole  40 mg Oral BID WC  . pravastatin  40 mg Oral q1800  . ranolazine  1,000 mg Oral BID  . vitamin E  1,200 Units Oral Daily  . zolpidem  10 mg Oral QHS   . nitroGLYCERIN Stopped (09/19/14 0756)    OBJECTIVE  Filed Vitals:   09/19/14 0010 09/19/14 0105 09/19/14 0500 09/19/14 0816  BP: 139/83  128/82 119/62  Pulse: 69  73 79  Temp: 97.8 F (36.6 C)  97.1 F (36.2 C) 97.9 F (36.6 C)  TempSrc: Oral  Oral Oral  Resp:  18  18  Height:      Weight:   245 lb (111.131 kg)   SpO2: 100%  97% 100%    Intake/Output Summary (Last 24 hours) at 09/19/14 1025 Last data filed at 09/18/14 1700  Gross per 24 hour  Intake    360 ml  Output   1250 ml  Net   -890 ml   Filed Weights   09/18/14 0211 09/18/14 0659 09/19/14 0500  Weight: 245 lb (111.131 kg) 245 lb 9.6 oz (111.403 kg) 245 lb (111.131 kg)    PHYSICAL EXAM  General: Pleasant, NAD. Neuro: Alert and oriented X 3. Moves all extremities spontaneously. Psych: Normal affect. HEENT:  Normal  Neck: Supple  without bruits or JVD. Lungs:  Resp regular and unlabored, CTA. Heart: RRR no s3, s4, or murmurs. Abdomen: Soft, non-tender, non-distended, BS + x 4.  Extremities: No clubbing, cyanosis or edema. DP/PT/Radials 2+ and equal bilaterally.  Accessory Clinical Findings  CBC  Recent Labs  09/18/14 0617 09/19/14 0325  WBC 6.1 7.5  HGB 11.4* 11.3*  HCT 34.2* 34.8*  MCV 82.8 84.3  PLT 179 [id number]   Basic Metabolic Panel  Recent Labs  09/18/14 0223 09/19/14 0325  NA 136 134*  K 4.5 5.5*  CL 103 104  CO2 21 23  GLUCOSE 246* 278*  BUN 28* 33*  CREATININE 2.22* 2.59*  CALCIUM 9.3 9.0   Cardiac Enzymes  Recent Labs  09/18/14 0617 09/18/14 1240  TROPONINI <0.03 <0.03   Radiology/Studies  Dg Chest 2 View  09/18/2014   CLINICAL DATA:  Coronary disease, chest pain.  IMPRESSION: No acute cardiopulmonary process.    TELE: SR    ASSESSMENT AND PLAN  1.Chest pain: most probably sec to hypertensive urgency and poorly controlled  glucose, h/o CAD, cath in 11/2013. Troponin negative x 2, ECG unchanged. Now chest pain free, BP controlled off iv nitroglycerin.  - Continue carvedilol, restarted losartan, fenofibrate, increased dose of imdur, ranexa and pravastatin - would prefer atorvastatin or rosuvasatin, but will not change due to cost concerns - if BP controlled and he remains chest pain free I wouldn't proceed with further ischemic work upas he is CKD stage 3.   2. DM: Mr. Thrapp does not like the 70/30 insulin and prefers lantus with a short acting insulin. He currently reports a daily insulin intake of 75 units. He is currently prescribed 45 units of 70/30. - Start lantus 30 units with 10 tidac of aspart - Consider diabetes management/Endocrine consult - He requests assistance finding a new Endocrine provider - we will try to arrange prior to the discharge - Check hgb A1c  3. CHD stage 3 - mildly worsened Crea today 2.2 --> 2.5, possibly due to dehydration (diarrhea  yesterday).  4. Code; Full  We will discharge the patient today. Follow up with Dr Aundra Dubin. Encouraged to drink fluids in the next 2 days as he is dehydrated with mildly worsened Crea. Repeat BMP at the clinic. K - hemolyzed specimen.  We will arrange for an outpatient endocrinology consult as his DM is uncontrolled.  Signed, Dorothy Spark MD, [facility] 09/19/2014

## 2014-09-19 NOTE — Discharge Summary (Signed)
Discharge Summary   Patient ID: Matthew Custard.,  MRN: [id number], DOB/AGE: 12/17/1960 54 y.o.  Admit date: 09/18/2014 Discharge date: 09/19/2014  Primary Care Provider: Lamar Blinks Primary Cardiologist: Einar Crow, MD   Discharge Diagnoses Principal Problem:   Unstable angina Active Problems:   Medically noncompliant   CAD (coronary artery disease) of artery bypass graft   HTN (hypertension)   CKD (chronic kidney disease), stage III   Diabetes type 2, uncontrolled   Dyslipidemia   OSA on CPAP   Obesity   Allergies Allergies  Allergen Reactions  . Ciprocin-Fluocin-Procin [Fluocinolone Acetonide] Rash  . Clarithromycin Itching and Other (See Comments)    "Biaxin" Eyes itch and burn  . Cleocin [Clindamycin Hcl] Anaphylaxis and Swelling  . Glimepiride [Amaryl] Other (See Comments)    Elevates liver function     Procedures  None  History of Present Illness  54 y/o male with a h/o CAD s/p prior RIMA RCA with subsequent LAD and distal RCA stenting, most recently in March 2015.  He recently ran out of his amlodipine and losartan and secondary to financial concerns, he was not able to have them refilled.  He was in his usual state of health until two nights prior to admission, when he awoke with substernal chest pressure with radiation down the right arm and without associated symptoms. Symptoms eventually resolved after using for sublingual nitroglycerin tablets. On the evening prior to admission, he developed recurrent chest discomfort but did not have any more nitroglycerin. He presented to the [facility] emergency department where he was moderately hypertensive at 168/87. Troponin was normal. ECG was nonacute. He was admitted for further evaluation.  Hospital Course  Patient ruled out for myocardial infarction. Following admission, previous home medications were continued and amlodipine and losartan were resumed. With this, we were able to easily achieve adequate  blood pressure control and patient had no further symptoms. In the setting of stage III chronic kidney disease with a creatinine of 2.22 on January 9, along with no objective evidence of ischemia on admission, decision was made not to pursue additional ischemic evaluation at this time. He has been counseled on the importance of medication compliance and he will be discharged today in good condition. Of note, he reported to [place] that glucoses have been running high at home and a hemoglobin A1c was obtained and returned elevated at 9.7. He admits to some noncompliance with his insulin dosing. We stressed the importance of compliance and also have recommended early follow-up with his endocrinologist.  Discharge Vitals Blood pressure 119/62, pulse 79, temperature 97.9 F (36.6 C), temperature source Oral, resp. rate 18, height 5' 5.5" (1.664 m), weight 245 lb (111.131 kg), SpO2 100 %.  Filed Weights   09/18/14 0211 09/18/14 0659 09/19/14 0500  Weight: 245 lb (111.131 kg) 245 lb 9.6 oz (111.403 kg) 245 lb (111.131 kg)    Labs  CBC  Recent Labs  09/18/14 0617 09/19/14 0325  WBC 6.1 7.5  HGB 11.4* 11.3*  HCT 34.2* 34.8*  MCV 82.8 84.3  PLT 179 [id number]   Basic Metabolic Panel  Recent Labs  09/18/14 0223 09/19/14 0325  NA 136 134*  K 4.5 5.5*  CL 103 104  CO2 21 23  GLUCOSE 246* 278*  BUN 28* 33*  CREATININE 2.22* 2.59*  CALCIUM 9.3 9.0   Cardiac Enzymes  Recent Labs  09/18/14 0617 09/18/14 1240  TROPONINI <0.03 <0.03   Hemoglobin A1C  Recent Labs  09/18/14 0617  HGBA1C 9.7*  Fasting Lipid Panel  Recent Labs  09/19/14 0325  CHOL 243*  HDL 30*  LDLCALC UNABLE TO CALCULATE IF TRIGLYCERIDE OVER 400 mg/dL  TRIG 524*  CHOLHDL 8.1   Disposition  Pt is being discharged home today in good condition.  Follow-up Plans & Appointments  Follow-up Information    Follow up with [name], MD On 11/03/2014.   Specialty:  Cardiology   Why:  8:00 AM - we will also arrange  for follow-up with Dr. Claris Gladden Nurse Practitioner in 1-2 wks.   Contact information:   [id number] [place] [address] [phone]       Follow up with Elayne Snare, MD.   Specialty:  Endocrinology   Why:  1-2 wks for diabetes management.   Contact information:   [address] [place] [address] [phone]       Discharge Medications    Medication List    STOP taking these medications        hydrochlorothiazide 25 MG tablet  Commonly known as:  HYDRODIURIL     vitamin E 400 UNIT capsule      TAKE these medications        ACCU-CHEK SOFTCLIX LANCETS lancets  Use as instructed to check blood sugar 4 times per day dx code E.11.9     amLODipine 10 MG tablet  Commonly known as:  NORVASC  Take 1 tablet (10 mg total) by mouth daily.     aspirin 81 MG tablet  Take 1 tablet (81 mg total) by mouth daily.     B-D UF III MINI PEN NEEDLES 31G X 5 MM Misc  Generic drug:  Insulin Pen Needle  USE ONE PENNEEDLE DAILY FOR INSULIN INJECTION.     carvedilol 25 MG tablet  Commonly known as:  COREG  2 tablets (50mg ) two times a day     clopidogrel 75 MG tablet  Commonly known as:  PLAVIX  Take 1 tablet (75 mg total) by mouth daily with breakfast.     diphenhydramine-acetaminophen 25-500 MG Tabs  Commonly known as:  TYLENOL PM  Take 2 tablets by mouth at bedtime.     fenofibrate 145 MG tablet  Commonly known as:  TRICOR  Take 1 tablet (145 mg total) by mouth daily.     gabapentin 300 MG capsule  Commonly known as:  NEURONTIN  Take 1 capsule (300 mg total) by mouth 3 (three) times daily.     glucose blood test strip  Commonly known as:  ACCU-CHEK AVIVA PLUS  Use as instructed to check blood sugar 4 times per day dx code E11.9     Insulin Glargine 300 UNIT/ML Sopn  Inject 20 Units into the skin daily. Uses 20-30 units daily patient states he takes this 3 times a day     insulin NPH-regular Human (70-30) 100 UNIT/ML injection    Commonly known as:  NOVOLIN 70/30 RELION  Inject 15 Units into the skin 3 (three) times daily.     INSULIN SYRINGE .5CC/31GX5/16" 31G X 5/16" 0.5 ML Misc  Use one needle three times per day     isosorbide mononitrate 120 MG 24 hr tablet  Commonly known as:  IMDUR  Take 1 tablet (120 mg total) by mouth daily.     losartan 100 MG tablet  Commonly known as:  COZAAR  Take 1 tablet (100 mg total) by mouth daily.     multivitamin with minerals Tabs tablet  Take 1 tablet by  mouth daily.     nitroGLYCERIN 0.4 MG SL tablet  Commonly known as:  NITROSTAT  Place 1 tablet (0.4 mg total) under the tongue every 5 (five) minutes as needed for chest pain.     pantoprazole 40 MG tablet  Commonly known as:  PROTONIX  Take 1 tablet (40 mg total) by mouth 2 (two) times daily with a meal.     pravastatin 40 MG tablet  Commonly known as:  PRAVACHOL  Take 40 mg by mouth daily.     ranolazine 1000 MG SR tablet  Commonly known as:  RANEXA  Take 1,000 mg by mouth 2 (two) times daily.     zolpidem 10 MG tablet  Commonly known as:  AMBIEN  Take 1 tablet (10 mg total) by mouth at bedtime.        Outstanding Labs/Studies  None  Duration of Discharge Encounter   Greater than 30 minutes including physician time.  Signed, Murray Hodgkins NP 09/19/2014, 11:30 AM

## 2014-09-22 ENCOUNTER — Other Ambulatory Visit: Payer: Self-pay | Admitting: Family Medicine

## 2014-09-24 NOTE — Telephone Encounter (Signed)
Faxed

## 2014-09-29 ENCOUNTER — Telehealth: Payer: Self-pay

## 2014-09-29 NOTE — Telephone Encounter (Signed)
Patient is having trouble getting his "Novolin 70/30" filled at [place]. Per patient his [place] insurance denied it. Patient not sure what to do because he is completely out. Patient is requesting someone to contact [place] or [place] on [address]. Patient's call back number is [phone] is completely out

## 2014-09-29 NOTE — Telephone Encounter (Signed)
I called pharm who advised that the Novolin is covered, pt just has to meet the deductible again. Called pt to explain this and he stated that he does have to meet a deductible, but according to the [facility], his doctor needs to do a tier reduction to try to get price reduced. Pt stated that Dr Dwyane Dee is not willing to talk to anyone and his BS has not been controlled since he started managing his DM. Pt reqs referral to a different ENDO and asks that we submit a Tier reduction form/letter for the Novolin. Dr Lorelei Pont, I will try to get in touch w/[employer] tomorrow to see what is needed, but wanted to forward to you FYI and to see if you want to refer to another Endo.

## 2014-10-09 NOTE — Telephone Encounter (Signed)
Called him back. He reports that his sugar is "not going crazy" but he would like to just come and see me.  This is certainly fine- he will see me on Monday afternoon at 102

## 2014-10-11 ENCOUNTER — Ambulatory Visit (INDEPENDENT_AMBULATORY_CARE_PROVIDER_SITE_OTHER): Payer: Commercial Managed Care - HMO | Admitting: Family Medicine

## 2014-10-11 VITALS — BP 150/86 | HR 79 | Temp 98.6°F | Resp 18 | Ht 66.0 in | Wt 249.4 lb

## 2014-10-11 DIAGNOSIS — E0821 Diabetes mellitus due to underlying condition with diabetic nephropathy: Secondary | ICD-10-CM | POA: Diagnosis not present

## 2014-10-11 LAB — GLUCOSE, POCT (MANUAL RESULT ENTRY): POC Glucose: 189 mg/dl — AB (ref 70–99)

## 2014-10-11 MED ORDER — INSULIN GLARGINE 100 UNIT/ML SOLOSTAR PEN: PEN_INJECTOR | SUBCUTANEOUS | Status: DC

## 2014-10-11 NOTE — Patient Instructions (Signed)
I will be in touch with your labs asap!  

## 2014-10-11 NOTE — Progress Notes (Signed)
[facility] [address] [place] [address] [phone]  Date:  10/11/2014   Name:  Matthew Rowe   DOB:  1961-08-13   MRN:  [id number]  PCP:  Lamar Blinks, MD    Chief Complaint: Follow-up   History of Present Illness:  Matthew Rowe. is a 54 y.o. very pleasant male patient who presents with the following:  Here today to follow-up on his DM.  He was seen at the ER last month with angina- he was kept overnight with negaitve enzymes, restarted his BP medications.  He did not have a stress test or other cardiac evaluation.  He has been seeing an endocrinologist recently for his DM; however pt states that he does not think they are a good fit, he is asking for another referral.  He seems mostly upset that the doctor did not contact his insurance company for him and discuss changes in his medications/ cost variation with [employer]; I explained that physicians do not generally do this.  He is currently taking novolin 70/30 QID, and he is not taking lantus at all.   He is taking 70/30 at 7, 11:30, 3pm and 6 pm.  He is taking 20 units each dose, sometimes doing an extra dose at 11 pm.  He is using 80- 100 units daily total.  However he notes that his sugar is worse than usual lately.  (his control has never been great, BMI of 40).  Lab Results  Component Value Date   HGBA1C 9.7* 09/18/2014    He had been on lantus in the past, but has had difficulty affording this.  Ideally he would like to be on lantus again.  We have tried to get a tier reduction for him and are working on this.   He had asked about a pump, but so far he has been told that he did not qualify for this.    His glucose is currently around 300 at his best times.  He has taken 60 units of insulin so far today.  He had also been on the [employer] in the past and like it too.  Wt Readings from Last 3 Encounters:  10/11/14 249 lb 6.4 oz (113.127 kg)  09/19/14 245 lb (111.131 kg)  07/07/14  243 lb (110.224 kg)   Patient Active Problem List   Diagnosis Date Noted  . CAD (coronary artery disease) of artery bypass graft 04/08/2014  . Unstable angina 12/01/2013  . Chest pain 11/27/2013  . Financial difficulty 03/13/2013  . Medically noncompliant 03/13/2013  . Diabetes type 2, uncontrolled 03/13/2013  . Obesity 11/27/2012  . Angina pectoris 09/16/2012  . CKD (chronic kidney disease), stage III 09/15/2012  . Polycystic kidney disease 09/15/2012  . OSA on CPAP 09/15/2012  . HTN (hypertension) 03/27/2011  . Type 2 diabetes mellitus 12/21/2010  . Dyslipidemia     Past Medical History  Diagnosis Date  . Dyslipidemia   . Coronary artery disease     a. INF MI 1999: PCI of RCA;  b.  extensive stenting of LAD;  c. s/p CABG with RIMA->RCA in 2006;  d. Stable, Low risk MV 05/2012; e. 09/2012 Cath: stable anatomy->med Rx. f. Abnl nuc 11/2013 -> med rx; g. 11/2013 Cath/PCI: LM 30d, LAD 50ost, 20/50isr, 100d, LCX 40-50ost, OM2 50ost, RI 70-80ost sup branch, RCA 20p, 90/60d(2.25x17 & 2.25x24 Promus DES'), PDA 60p, RIMA->RCA ok.  . Hypertension   . Hx of echocardiogram     a.  Echo 3/12: mild LVH, EF 55-60%, trivial AI  . DM2 (diabetes mellitus, type 2)   . History of blood transfusion ?2011  . GERD (gastroesophageal reflux disease)     "once; came to hospital" (09/15/2012)  . OSA on CPAP   . CKD (chronic kidney disease), stage III   . Polycystic kidney disease     Past Surgical History  Procedure Laterality Date  . Cystectomy  1990's    off face  . Coronary angioplasty with stent placement  1999 / 2002 / 2004 / 2006?  Marland Kitchen Cardiac catheterization  2012  . Coronary artery bypass graft  2006    CABG X?; in [place]  . Cardiac catheterization  2013  . Cardiac catheterization  2014    LHC (1/14): total occlusion of distal LAD, diffuse disease in ramus, 70% proximal LCx, 50% proximal and mid RCA, 50% distal RCA, patent RIMA-RCA.  Marland Kitchen Left heart catheterization with coronary/graft angiogram  N/A 09/16/2012    Procedure: LEFT HEART CATHETERIZATION WITH [name];  Surgeon: [name], MD;  Location: [facility] CATH LAB;  Service: Cardiovascular;  Laterality: N/A;  . Left heart catheterization with coronary/graft angiogram N/A 12/02/2013    Procedure: LEFT HEART CATHETERIZATION WITH [name];  Surgeon: [name], MD;  Location: [place] CATH LAB;  Service: Cardiovascular;  Laterality: N/A;  . Percutaneous coronary stent intervention (pci-s) N/A 12/03/2013    Procedure: PERCUTANEOUS CORONARY STENT INTERVENTION (PCI-S);  Surgeon: [name], MD;  Location: [facility] CATH LAB;  Service: Cardiovascular;  Laterality: N/A;    History  Substance Use Topics  . Smoking status: Never Smoker   . Smokeless tobacco: Never Used     Comment: smoked some as a teenager (high school)  . Alcohol Use: No     Comment: 09/15/2012 "drank a little when I was young"    Family History  Problem Relation Age of Onset  . Lung cancer Mother   . Anemia Mother   . Polycystic kidney disease Mother   . Diabetes Father   . Heart attack Father 16    Died suddenly  . Heart failure Father   . Hyperlipidemia Father   . Hypertension Father   . Kidney failure Sister 5    Died  . Heart attack Sister   . Hypertension Sister   . Diabetes Sister   . Diabetes Brother   . Polycystic kidney disease Brother   . Diabetes Sister     Allergies  Allergen Reactions  . Ciprocin-Fluocin-Procin [Fluocinolone Acetonide] Rash  . Clarithromycin Itching and Other (See Comments)    "Biaxin" Eyes itch and burn  . Cleocin [Clindamycin Hcl] Anaphylaxis and Swelling  . Glimepiride [Amaryl] Other (See Comments)    Elevates liver function     Medication list has been reviewed and updated.  Current Outpatient Prescriptions on File Prior to Visit  Medication Sig Dispense Refill  . amLODipine (NORVASC) 10 MG tablet Take 1 tablet (10 mg total) by mouth daily. 30 tablet 6  . aspirin 81 MG tablet Take  1 tablet (81 mg total) by mouth daily.    . carvedilol (COREG) 25 MG tablet 2 tablets (50mg ) two times a day 120 tablet 6  . clopidogrel (PLAVIX) 75 MG tablet Take 1 tablet (75 mg total) by mouth daily with breakfast. 90 tablet 3  . diphenhydramine-acetaminophen (TYLENOL PM) 25-500 MG TABS Take 2 tablets by mouth at bedtime.    . fenofibrate (TRICOR) 145 MG tablet Take 1 tablet (145 mg total) by mouth  daily. 30 tablet 6  . gabapentin (NEURONTIN) 300 MG capsule Take 1 capsule (300 mg total) by mouth 3 (three) times daily. 270 capsule 3  . glucose blood (ACCU-CHEK AVIVA PLUS) test strip Use as instructed to check blood sugar 4 times per day dx code E11.9 125 each 3  . insulin NPH-regular Human (NOVOLIN 70/30 RELION) (70-30) 100 UNIT/ML injection Inject 15 Units into the skin 3 (three) times daily. 20 mL 2  . Insulin Syringe-Needle U-100 (INSULIN SYRINGE .5CC/31GX5/16") 31G X 5/16" 0.5 ML MISC Use one needle three times per day 100 each 3  . isosorbide mononitrate (IMDUR) 120 MG 24 hr tablet Take 1 tablet (120 mg total) by mouth daily. 30 tablet 6  . losartan (COZAAR) 100 MG tablet Take 1 tablet (100 mg total) by mouth daily. 30 tablet 6  . Multiple Vitamin (MULTIVITAMIN WITH MINERALS) TABS tablet Take 1 tablet by mouth daily.    . nitroGLYCERIN (NITROSTAT) 0.4 MG SL tablet Place 1 tablet (0.4 mg total) under the tongue every 5 (five) minutes as needed for chest pain. 25 tablet 3  . pantoprazole (PROTONIX) 40 MG tablet Take 1 tablet (40 mg total) by mouth 2 (two) times daily with a meal. 180 tablet 3  . pravastatin (PRAVACHOL) 40 MG tablet Take 1 tablet (40 mg total) by mouth daily. 30 tablet 6  . ranolazine (RANEXA) 1000 MG SR tablet Take 1 tablet (1,000 mg total) by mouth 2 (two) times daily. 30 tablet 6  . zolpidem (AMBIEN) 10 MG tablet TAKE 1 TABLET AT BEDTIME 90 tablet 0  . ACCU-CHEK SOFTCLIX LANCETS lancets Use as instructed to check blood sugar 4 times per day dx code E.11.9 (Patient not taking:  Reported on 10/11/2014) 200 each 3  . B-D UF III MINI PEN NEEDLES 31G X 5 MM MISC USE ONE DAILY FOR INSULIN INJECTION (Patient not taking: Reported on 10/11/2014) 90 each 1  . insulin glargine (LANTUS) 100 UNIT/ML injection Inject 0.3 mLs (30 Units total) into the skin daily. (Patient not taking: Reported on 10/11/2014) 10 mL 11   No current facility-administered medications on file prior to visit.    Review of Systems:  As per HPI- otherwise negative.   Physical Examination: Filed Vitals:   10/11/14 1350  BP: 150/86  Pulse: 79  Temp: 98.6 F (37 C)  Resp: 18   Filed Vitals:   10/11/14 1350  Height: 5\' 6"  (1.676 m)  Weight: 249 lb 6.4 oz (113.127 kg)   Body mass index is 40.27 kg/(m^2). Ideal Body Weight: Weight in (lb) to have BMI = 25: 154.6  GEN: WDWN, NAD, Non-toxic, A & O x 3, obese, looks well HEENT: Atraumatic, Normocephalic. Neck supple. No masses, No LAD. Ears and Nose: No external deformity. CV: RRR, No M/G/R. No JVD. No thrill. No extra heart sounds. PULM: CTA B, no wheezes, crackles, rhonchi. No retractions. No resp. distress. No accessory muscle use. EXTR: No c/c/e NEURO Normal gait.  PSYCH: Normally interactive. Conversant. Not depressed or anxious appearing.  Calm demeanor.   Results for orders placed or performed in visit on 10/11/14  POCT glucose (manual entry)  Result Value Ref Range   POC Glucose 189 (A) 70 - 99 mg/dl    Assessment and Plan: Diabetes mellitus due to underlying condition with diabetic nephropathy - Plan: POCT glucose (manual entry), Basic metabolic panel, Ambulatory referral to Endocrinology, Insulin Glargine (LANTUS SOLOSTAR) 100 UNIT/ML Solostar Pen, CANCELED: Basic metabolic panel   Matthew Rowe is here to discuss his  DM which he is asking me to manage again.  I have explained that I think he needs the expertise of an endocrinologist but he would like a referral to someone else.  I will do this but in the meantime was able to find some  lantus pens to give him while we work on his tier reduction He will start on 10units of lantus this evening and we will touch base in the morning.  Will plan to go up on his lantus more quickly than a new start pt and will taper off his 70/30 as allowable   Signed Lamar Blinks, MD  Called him 2/2 at 2:30.  LMOM - sorry that I forgot to call earlier.  Please give me a call back or I will try to get him again later Reached him at 5pm.  His glucose was 220 this am, he took 20 units of lantus this am and then has also used 50 units of his 70/30 all together through the day.   Asked him to continue to go up by 2 units of lantus every 2 days as long as his FBG is over 150.  He will adjust his 70/30 during the day, loosely following a plan to take 10- 15 units if his pre- meal glucose is over 200.  Cautioned him that it is important to avoid getting too low- he has not noted any sx of hypoglycemia.  He will call me in 3 days with an update.

## 2014-10-12 ENCOUNTER — Telehealth: Payer: Self-pay | Admitting: Family Medicine

## 2014-10-13 LAB — BASIC METABOLIC PANEL
BUN: 29 mg/dL — AB (ref 6–23)
CHLORIDE: 106 meq/L (ref 96–112)
CO2: 22 meq/L (ref 19–32)
Calcium: 9.8 mg/dL (ref 8.4–10.5)
Creat: 1.98 mg/dL — ABNORMAL HIGH (ref 0.50–1.35)
Glucose, Bld: 186 mg/dL — ABNORMAL HIGH (ref 70–99)
POTASSIUM: 4.7 meq/L (ref 3.5–5.3)
SODIUM: 138 meq/L (ref 135–145)

## 2014-10-16 NOTE — Telephone Encounter (Signed)
Called him for an update.  He is using 20 of 70/30 TID, and is gradually going on on his lantus.  His glucose is running 250- 300.  Advised him to go up on lanuts by 2units every day, and he will give me an update in a couple of days

## 2014-10-29 ENCOUNTER — Telehealth: Payer: Self-pay

## 2014-10-29 NOTE — Telephone Encounter (Signed)
I called [employer] and was advised that pt was approved for program last year and he just needs to fill out another application. Printed appl and completed what I could. I have put in Dr Copland's box for completion.

## 2014-11-03 ENCOUNTER — Ambulatory Visit (INDEPENDENT_AMBULATORY_CARE_PROVIDER_SITE_OTHER): Payer: Commercial Managed Care - HMO | Admitting: Cardiology

## 2014-11-03 ENCOUNTER — Encounter: Payer: Self-pay | Admitting: Cardiology

## 2014-11-03 VITALS — BP 122/80 | HR 81 | Ht 66.0 in | Wt 248.0 lb

## 2014-11-03 DIAGNOSIS — N183 Chronic kidney disease, stage 3 unspecified: Secondary | ICD-10-CM

## 2014-11-03 DIAGNOSIS — E669 Obesity, unspecified: Secondary | ICD-10-CM

## 2014-11-03 DIAGNOSIS — E785 Hyperlipidemia, unspecified: Secondary | ICD-10-CM | POA: Diagnosis not present

## 2014-11-03 DIAGNOSIS — Z9989 Dependence on other enabling machines and devices: Secondary | ICD-10-CM

## 2014-11-03 DIAGNOSIS — I25810 Atherosclerosis of coronary artery bypass graft(s) without angina pectoris: Secondary | ICD-10-CM | POA: Diagnosis not present

## 2014-11-03 DIAGNOSIS — I25812 Atherosclerosis of bypass graft of coronary artery of transplanted heart without angina pectoris: Secondary | ICD-10-CM | POA: Diagnosis not present

## 2014-11-03 DIAGNOSIS — G4733 Obstructive sleep apnea (adult) (pediatric): Secondary | ICD-10-CM

## 2014-11-03 LAB — LIPID PANEL
Cholesterol: 197 mg/dL (ref 0–200)
HDL: 33 mg/dL — ABNORMAL LOW (ref >39.00)
Total CHOL/HDL Ratio: 6
Triglycerides: 511 mg/dL — ABNORMAL HIGH (ref 0.0–149.0)

## 2014-11-03 LAB — LDL CHOLESTEROL, DIRECT: LDL DIRECT: 72 mg/dL

## 2014-11-03 MED ORDER — NITROGLYCERIN 0.4 MG SL SUBL: 0.4000 mg | SUBLINGUAL_TABLET | SUBLINGUAL | Status: DC | PRN

## 2014-11-03 NOTE — Progress Notes (Signed)
Patient ID: Matthew Aldana., male   DOB: 03-30-1961, 54 y.o.   MRN: [id number] PCP: Dr. Lorelei Pont  54 yo with history CAD s/p CABG (RIMA to RCA, anomalous RCA off left cusp), occlusion of the distal LAD, and chronic angina presents for cardiology followup.  Patient has had chronic angina for years. In 1/14, he developed progressive angina and was hospitalized.  LHC was done, showing unchanged anatomy.  RIMA-RCA was patent.  He did well after this on ranolazine.  In 4/14, he ran out of ranolazine and the chest pain returned. He was admitted overnight in 4/14 and ruled out for MI.  In 7/14, he had again run low on ranolazine and developed more chest pain.  He was admitted again and ruled out for MI.  In 3/15, he was admitted with recurrent chest pain.  LHC was done, showing 90% distal RCA stenosis after RIMA insertion.  He had Promus DES x 2 to distal RCA.  He ran out of amlodipine and losartan in 1/16 and developed chest pain in the setting of elevated BP. He was admitted and ruled out for MI.  Chest pain resolved with BP control.   He is now on all his medications.  He is at baseline.  He has chest pain if he walks fast or runs or if he walks fast up steps. He will have mild chest pain about every other day.  This is his chronic stable pattern.  This is much better than in the past.  He is rarely using NTG.  No significant exertional dyspnea.  No palpitations, orthopnea, PND.  He is using his CPAP nightly.   ECG: NSR, LPFB, poor RWP  Labs (7/13): K 4.9, creatinine 1.75, LDL 63 Labs (1/14): K 4.2, creatinine 1.65, TGs 483 (could not calculate LDL) Labs (4/14): K 4.8, creatinine 1.62, HCT 36.8 Labs (7/14): K 4.3, creatinine 1.63, LDL 56, HDL 34, TGs 382 Labs (1/15): K 4.6, creatinine 2 Labs (3/15): TGs 605, LDL unable to calculate Labs (5/15): K 4.7, creatinine 2.2 Labs (7/15): K 4.4, creatinine 2.2, LDL 51, HDL 32, TGs 983 Labs (1/16): TGs 524, unable to calculate LDL Labs (2/16): K 4.7, creatinine  1.98  PMH: 1. CAD: CABG 2006 at [facility] with [place].  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  Patient has had prior extensive PCI to LAD with occlusion of the distal LAD.  LHC (7/12): proximal LAD stent, extensive mid-distal LAD stenting, 60-70% mid LAD instent restenosis, subtotal distal LAD instent restenosis, LCFx without significant disease, anomalous RCA off left cusp with mild disease, patent RIMA-RCA.  Echo (3/12): EF 55-60%, trivial AI, mild LVH.  Lexiscan myoview (9/13) with small apical scar, moderate inferior scar from base to apex with some reversibility, EF 47% with apical hypokinesis (similar to prior study).  Chronic angina.  LHC (1/14): total occlusion of distal LAD, diffuse disease in ramus, 70% proximal LCx, 50% proximal and mid RCA, 50% distal RCA, patent RIMA-RCA.   LHC (3/15) with multiple stents in the LAD and 50% ostial LAD stenosis, total occlusion distal LAD, 40-50% ostial LCx stenosis, 90% distal RCA stenosis after RIMA insertion.  Patient had DES x 2 to distal RCA.  2. OSA: CPAP. 3. Type II diabetes 4. Hyperlipidemia: had side effects from Lipitor. Very high TGs.  5. HTN 6. CKD: polycystic kidney disease.  7. Ischemic cardiomyopathy: EF 55-60% by echo in 3/12, EF 47% with apical hypokinesis in 9/13.   SH: Nonsmoker.  Married,  lives in [place].  On disability.   FH: Multiple family members with MIs.   ROS: All systems reviewed and negative except as per HPI.   Current Outpatient Prescriptions  Medication Sig Dispense Refill  . ACCU-CHEK SOFTCLIX LANCETS lancets Use as instructed to check blood sugar 4 times per day dx code E.11.9 200 each 3  . amLODipine (NORVASC) 10 MG tablet Take 1 tablet (10 mg total) by mouth daily. 30 tablet 6  . aspirin 81 MG tablet Take 1 tablet (81 mg total) by mouth daily.    . B-D UF III MINI PEN NEEDLES 31G X 5 MM MISC USE ONE DAILY FOR INSULIN INJECTION 90 each 1  . carvedilol (COREG) 25 MG tablet 2 tablets  (50mg ) two times a day 120 tablet 6  . clopidogrel (PLAVIX) 75 MG tablet Take 1 tablet (75 mg total) by mouth daily with breakfast. 90 tablet 3  . diphenhydramine-acetaminophen (TYLENOL PM) 25-500 MG TABS Take 2 tablets by mouth at bedtime.    . fenofibrate (TRICOR) 145 MG tablet Take 1 tablet (145 mg total) by mouth daily. 30 tablet 6  . gabapentin (NEURONTIN) 300 MG capsule Take 1 capsule (300 mg total) by mouth 3 (three) times daily. 270 capsule 3  . glucose blood (ACCU-CHEK AVIVA PLUS) test strip Use as instructed to check blood sugar 4 times per day dx code E11.9 125 each 3  . Insulin Glargine (LANTUS SOLOSTAR) 100 UNIT/ML Solostar Pen Start with 10 units and titrate up as needed 5 pen PRN  . insulin NPH-regular Human (NOVOLIN 70/30 RELION) (70-30) 100 UNIT/ML injection Inject 15 Units into the skin 3 (three) times daily. 20 mL 2  . Insulin Syringe-Needle U-100 (INSULIN SYRINGE .5CC/31GX5/16") 31G X 5/16" 0.5 ML MISC Use one needle three times per day 100 each 3  . isosorbide mononitrate (IMDUR) 120 MG 24 hr tablet Take 1 tablet (120 mg total) by mouth daily. 30 tablet 6  . losartan (COZAAR) 100 MG tablet Take 1 tablet (100 mg total) by mouth daily. 30 tablet 6  . Multiple Vitamin (MULTIVITAMIN WITH MINERALS) TABS tablet Take 1 tablet by mouth daily.    . nitroGLYCERIN (NITROSTAT) 0.4 MG SL tablet Place 1 tablet (0.4 mg total) under the tongue every 5 (five) minutes as needed for chest pain. 25 tablet 11  . pantoprazole (PROTONIX) 40 MG tablet Take 1 tablet (40 mg total) by mouth 2 (two) times daily with a meal. 180 tablet 3  . pravastatin (PRAVACHOL) 40 MG tablet Take 1 tablet (40 mg total) by mouth daily. 30 tablet 6  . ranolazine (RANEXA) 1000 MG SR tablet Take 1 tablet (1,000 mg total) by mouth 2 (two) times daily. 30 tablet 6  . zolpidem (AMBIEN) 10 MG tablet TAKE 1 TABLET AT BEDTIME 90 tablet 0   No current facility-administered medications for this visit.    BP 122/80 mmHg  Pulse 81   Ht 5\' 6"  (1.676 m)  Wt 248 lb (112.492 kg)  BMI 40.05 kg/m2 General: NAD, obese.  Neck: No JVD, no thyromegaly or thyroid nodule.  Lungs: Clear to auscultation bilaterally with normal respiratory effort. CV: Nondisplaced PMI.  Heart regular S1/S2, no S3/S4, no murmur.  No peripheral edema.  No carotid bruit.  Normal pedal pulses.  Abdomen: Soft, nontender, no hepatosplenomegaly, no distention.  Neurologic: Alert and oriented x 3.  Psych: Normal affect. Extremities: No clubbing or cyanosis.   Assessment/Plan: 1. CAD:  Chronic stable angina as long as he stays on his  medications.  Has more chest pain when he runs out.  Currently stable, on all meds.   - Continue ASA 81 and Plavix long-term. - Continue statin, amlodipine, ranolazine, Coreg. 2. HTN: Controlled. 3. Hyperlipidemia: Triglycerides very high in in 1/16, fenofibrate increased. He is fasting today, will recheck. He will also continue statin.  4. CKD: Patient has APKD.  Creatinine stable. He has a nephrologist.   [name] 11/03/2014

## 2014-11-03 NOTE — Patient Instructions (Addendum)
Your physician recommends that you have a FASTING lipid profile today  Your physician recommends that you schedule a follow-up appointment in: 3 months with Dr Aundra Dubin.

## 2014-11-04 NOTE — Telephone Encounter (Signed)
Finished this today and put in fax box

## 2014-11-08 ENCOUNTER — Other Ambulatory Visit: Payer: Self-pay | Admitting: *Deleted

## 2014-11-08 MED ORDER — FENOFIBRATE 145 MG PO TABS: 145.0000 mg | ORAL_TABLET | Freq: Every day | ORAL | Status: DC

## 2014-11-11 ENCOUNTER — Ambulatory Visit: Payer: Commercial Managed Care - HMO | Admitting: Cardiology

## 2014-11-22 ENCOUNTER — Other Ambulatory Visit: Payer: Self-pay | Admitting: Family Medicine

## 2014-11-24 ENCOUNTER — Ambulatory Visit (INDEPENDENT_AMBULATORY_CARE_PROVIDER_SITE_OTHER): Payer: Commercial Managed Care - HMO

## 2014-11-24 ENCOUNTER — Ambulatory Visit (INDEPENDENT_AMBULATORY_CARE_PROVIDER_SITE_OTHER): Payer: Commercial Managed Care - HMO | Admitting: Family Medicine

## 2014-11-24 VITALS — BP 158/86 | HR 86 | Temp 98.1°F | Resp 18 | Ht 66.0 in | Wt 255.0 lb

## 2014-11-24 DIAGNOSIS — D539 Nutritional anemia, unspecified: Secondary | ICD-10-CM | POA: Diagnosis not present

## 2014-11-24 DIAGNOSIS — R059 Cough, unspecified: Secondary | ICD-10-CM

## 2014-11-24 DIAGNOSIS — R05 Cough: Secondary | ICD-10-CM

## 2014-11-24 DIAGNOSIS — E1165 Type 2 diabetes mellitus with hyperglycemia: Secondary | ICD-10-CM

## 2014-11-24 DIAGNOSIS — R5381 Other malaise: Secondary | ICD-10-CM | POA: Diagnosis not present

## 2014-11-24 DIAGNOSIS — M10072 Idiopathic gout, left ankle and foot: Secondary | ICD-10-CM

## 2014-11-24 DIAGNOSIS — E0821 Diabetes mellitus due to underlying condition with diabetic nephropathy: Secondary | ICD-10-CM | POA: Diagnosis not present

## 2014-11-24 DIAGNOSIS — N189 Chronic kidney disease, unspecified: Secondary | ICD-10-CM | POA: Diagnosis not present

## 2014-11-24 DIAGNOSIS — M10071 Idiopathic gout, right ankle and foot: Secondary | ICD-10-CM | POA: Diagnosis not present

## 2014-11-24 DIAGNOSIS — R509 Fever, unspecified: Secondary | ICD-10-CM | POA: Diagnosis not present

## 2014-11-24 DIAGNOSIS — IMO0002 Reserved for concepts with insufficient information to code with codable children: Secondary | ICD-10-CM

## 2014-11-24 LAB — POCT CBC
Granulocyte percent: 71.5 %G (ref 37–80)
HEMATOCRIT: 36.9 % — AB (ref 43.5–53.7)
HEMOGLOBIN: 11.7 g/dL — AB (ref 14.1–18.1)
LYMPH, POC: 2.3 (ref 0.6–3.4)
MCH, POC: 27.1 pg (ref 27–31.2)
MCHC: 31.7 g/dL — AB (ref 31.8–35.4)
MCV: 85.3 fL (ref 80–97)
MID (cbc): 0.5 (ref 0–0.9)
MPV: 7.9 fL (ref 0–99.8)
POC GRANULOCYTE: 7 — AB (ref 2–6.9)
POC LYMPH PERCENT: 23.8 %L (ref 10–50)
POC MID %: 4.7 %M (ref 0–12)
Platelet Count, POC: 303 10*3/uL (ref 142–424)
RBC: 4.33 M/uL — AB (ref 4.69–6.13)
RDW, POC: 13.9 %
WBC: 9.8 10*3/uL (ref 4.6–10.2)

## 2014-11-24 LAB — COMPREHENSIVE METABOLIC PANEL
ALBUMIN: 4.4 g/dL (ref 3.5–5.2)
ALT: 11 U/L (ref 0–53)
AST: 12 U/L (ref 0–37)
Alkaline Phosphatase: 76 U/L (ref 39–117)
BUN: 27 mg/dL — ABNORMAL HIGH (ref 6–23)
CALCIUM: 9.5 mg/dL (ref 8.4–10.5)
CHLORIDE: 106 meq/L (ref 96–112)
CO2: 23 mEq/L (ref 19–32)
Creat: 1.92 mg/dL — ABNORMAL HIGH (ref 0.50–1.35)
Glucose, Bld: 205 mg/dL — ABNORMAL HIGH (ref 70–99)
POTASSIUM: 4.9 meq/L (ref 3.5–5.3)
Sodium: 140 mEq/L (ref 135–145)
Total Bilirubin: 0.5 mg/dL (ref 0.2–1.2)
Total Protein: 7.5 g/dL (ref 6.0–8.3)

## 2014-11-24 LAB — POCT GLYCOSYLATED HEMOGLOBIN (HGB A1C): HEMOGLOBIN A1C: 10.2

## 2014-11-24 LAB — URIC ACID: Uric Acid, Serum: 9.3 mg/dL — ABNORMAL HIGH (ref 4.0–7.8)

## 2014-11-24 MED ORDER — DOXYCYCLINE HYCLATE 100 MG PO CAPS: 100.0000 mg | ORAL_CAPSULE | Freq: Two times a day (BID) | ORAL | Status: DC

## 2014-11-24 MED ORDER — INSULIN GLARGINE 100 UNIT/ML SOLOSTAR PEN: PEN_INJECTOR | SUBCUTANEOUS | Status: DC

## 2014-11-24 MED ORDER — COLCHICINE 0.6 MG PO TABS: ORAL_TABLET | ORAL | Status: DC

## 2014-11-24 NOTE — Patient Instructions (Addendum)
Hold the pravastatin and tricor (do not take) while you are on your colchicine.  Use the colchicine as needed for your gout pain Take doxycycline as directed for your chest; I do think that you have mild pneumonia   I will be in touch with the rest of your labs Let me know if you are not getting better soon! I will refer you to see Dr. Chalmers Cater (endocrinology) and also to see GI to discuss colonoscopy

## 2014-11-24 NOTE — Progress Notes (Signed)
[facility] [address], [place] [address] [phone]  Date:  11/24/2014   Name:  Matthew Rowe   DOB:  December 31, 1960   MRN:  [id number]  PCP:  Lamar Blinks, MD    Chief Complaint: Cough; Foot Pain; and Foot Swelling   History of Present Illness:  Matthew Rowe. is a 54 y.o. very pleasant male patient who presents with the following:  Here today with illness that starred about one week ago.  He first noted "a little virus" with chills, cough that is still persistent.  He had a fever up to 102.7; no fever in a few days. He had aches and other sx suggestive of the flu.  He got through the worst of this illness but he continues to cough.  The cough can be productive He has tried OTC medications as needed However his main concern today is his left foot. He notes swelling and pain at the 1st MTP.  This started the same time as his illness. He tried ibuprofen; this helps, but the pain comes back when the medication wears off.  He is not aware of any significant injury, although he did bump it a couple of times after the foot pain had already started- this was really painful  .   He is not sure if he was ever dx with gout in the past.  He had been using both novolin 70/30 and lantus.  He just ran out of his lantus yesterday.  He has been using 28 units of lantus, and 20 units of the 70/30 BID. He is not on any oral diabetes meds at this time.  He did not get along well with his endocrinologist and would like to be referred to someone else  His last colonoscopy was in 2006 Lab Results  Component Value Date   HGBA1C 9.7* 09/18/2014     Patient Active Problem List   Diagnosis Date Noted  . CAD (coronary artery disease) of artery bypass graft 04/08/2014  . Unstable angina 12/01/2013  . Chest pain 11/27/2013  . Financial difficulty 03/13/2013  . Medically noncompliant 03/13/2013  . Diabetes type 2, uncontrolled 03/13/2013  . Obesity 11/27/2012  .  Angina pectoris 09/16/2012  . CKD (chronic kidney disease), stage III 09/15/2012  . Polycystic kidney disease 09/15/2012  . OSA on CPAP 09/15/2012  . HTN (hypertension) 03/27/2011  . Type 2 diabetes mellitus 12/21/2010  . Dyslipidemia     Past Medical History  Diagnosis Date  . Dyslipidemia   . Coronary artery disease     a. INF MI 1999: PCI of RCA;  b.  extensive stenting of LAD;  c. s/p CABG with RIMA->RCA in 2006;  d. Stable, Low risk MV 05/2012; e. 09/2012 Cath: stable anatomy->med Rx. f. Abnl nuc 11/2013 -> med rx; g. 11/2013 Cath/PCI: LM 30d, LAD 50ost, 20/50isr, 100d, LCX 40-50ost, OM2 50ost, RI 70-80ost sup branch, RCA 20p, 90/60d(2.25x17 & 2.25x24 Promus DES'), PDA 60p, RIMA->RCA ok.  . Hypertension   . Hx of echocardiogram     a. Echo 3/12: mild LVH, EF 55-60%, trivial AI  . DM2 (diabetes mellitus, type 2)   . History of blood transfusion ?2011  . GERD (gastroesophageal reflux disease)     "once; came to hospital" (09/15/2012)  . OSA on CPAP   . CKD (chronic kidney disease), stage III   . Polycystic kidney disease     Past Surgical History  Procedure Laterality Date  . Cystectomy  1990's    off face  . Coronary angioplasty with stent placement  1999 / 2002 / 2004 / 2006?  Marland Kitchen Cardiac catheterization  2012  . Coronary artery bypass graft  2006    CABG X?; in [place]  . Cardiac catheterization  2013  . Cardiac catheterization  2014    LHC (1/14): total occlusion of distal LAD, diffuse disease in ramus, 70% proximal LCx, 50% proximal and mid RCA, 50% distal RCA, patent RIMA-RCA.  Marland Kitchen Left heart catheterization with coronary/graft angiogram N/A 09/16/2012    Procedure: LEFT HEART CATHETERIZATION WITH [name];  Surgeon: [name], MD;  Location: [facility] CATH LAB;  Service: Cardiovascular;  Laterality: N/A;  . Left heart catheterization with coronary/graft angiogram N/A 12/02/2013    Procedure: LEFT HEART CATHETERIZATION WITH [name];  Surgeon:  [name], MD;  Location: [facility] CATH LAB;  Service: Cardiovascular;  Laterality: N/A;  . Percutaneous coronary stent intervention (pci-s) N/A 12/03/2013    Procedure: PERCUTANEOUS CORONARY STENT INTERVENTION (PCI-S);  Surgeon: [name], MD;  Location: [facility] CATH LAB;  Service: Cardiovascular;  Laterality: N/A;    History  Substance Use Topics  . Smoking status: Never Smoker   . Smokeless tobacco: Never Used     Comment: smoked some as a teenager (high school)  . Alcohol Use: No     Comment: 09/15/2012 "drank a little when I was young"    Family History  Problem Relation Age of Onset  . Lung cancer Mother   . Anemia Mother   . Polycystic kidney disease Mother   . Diabetes Father   . Heart attack Father 80    Died suddenly  . Heart failure Father   . Hyperlipidemia Father   . Hypertension Father   . Kidney failure Sister 5    Died  . Heart attack Sister   . Hypertension Sister   . Diabetes Sister   . Diabetes Brother   . Polycystic kidney disease Brother   . Diabetes Sister     Allergies  Allergen Reactions  . Ciprocin-Fluocin-Procin [Fluocinolone Acetonide] Rash  . Clarithromycin Itching and Other (See Comments)    "Biaxin" Eyes itch and burn  . Cleocin [Clindamycin Hcl] Anaphylaxis and Swelling  . Glimepiride [Amaryl] Other (See Comments)    Elevates liver function     Medication list has been reviewed and updated.  Current Outpatient Prescriptions on File Prior to Visit  Medication Sig Dispense Refill  . ACCU-CHEK SOFTCLIX LANCETS lancets Use as instructed to check blood sugar 4 times per day dx code E.11.9 200 each 3  . amLODipine (NORVASC) 10 MG tablet Take 1 tablet (10 mg total) by mouth daily. 30 tablet 6  . aspirin 81 MG tablet Take 1 tablet (81 mg total) by mouth daily.    . B-D UF III MINI PEN NEEDLES 31G X 5 MM MISC USE ONE DAILY FOR INSULIN INJECTION 90 each 1  . carvedilol (COREG) 25 MG tablet 2 tablets (50mg ) two times a day 120 tablet 6  .  clopidogrel (PLAVIX) 75 MG tablet Take 1 tablet (75 mg total) by mouth daily with breakfast. 90 tablet 3  . fenofibrate (TRICOR) 145 MG tablet Take 1 tablet (145 mg total) by mouth daily. 90 tablet 3  . gabapentin (NEURONTIN) 300 MG capsule Take 1 capsule (300 mg total) by mouth 3 (three) times daily. 270 capsule 3  . glucose blood (ACCU-CHEK AVIVA PLUS) test strip Use as instructed to check blood sugar 4 times per  day dx code E11.9 125 each 3  . Insulin Glargine (LANTUS SOLOSTAR) 100 UNIT/ML Solostar Pen Start with 10 units and titrate up as needed 5 pen PRN  . insulin NPH-regular Human (NOVOLIN 70/30 RELION) (70-30) 100 UNIT/ML injection Inject 15 Units into the skin 3 (three) times daily. 20 mL 2  . Insulin Syringe-Needle U-100 (INSULIN SYRINGE .5CC/31GX5/16") 31G X 5/16" 0.5 ML MISC Use one needle three times per day 100 each 3  . isosorbide mononitrate (IMDUR) 120 MG 24 hr tablet Take 1 tablet (120 mg total) by mouth daily. 30 tablet 6  . losartan (COZAAR) 100 MG tablet TAKE 1 TABLET EVERY DAY 90 tablet 0  . nitroGLYCERIN (NITROSTAT) 0.4 MG SL tablet Place 1 tablet (0.4 mg total) under the tongue every 5 (five) minutes as needed for chest pain. 25 tablet 11  . pantoprazole (PROTONIX) 40 MG tablet Take 1 tablet (40 mg total) by mouth 2 (two) times daily with a meal. 180 tablet 3  . pravastatin (PRAVACHOL) 40 MG tablet Take 1 tablet (40 mg total) by mouth daily. 30 tablet 6  . ranolazine (RANEXA) 1000 MG SR tablet Take 1 tablet (1,000 mg total) by mouth 2 (two) times daily. 30 tablet 6  . zolpidem (AMBIEN) 10 MG tablet TAKE 1 TABLET AT BEDTIME 90 tablet 0  . diphenhydramine-acetaminophen (TYLENOL PM) 25-500 MG TABS Take 2 tablets by mouth at bedtime.    . Multiple Vitamin (MULTIVITAMIN WITH MINERALS) TABS tablet Take 1 tablet by mouth daily.     No current facility-administered medications on file prior to visit.    Review of Systems:  As per HPI- otherwise negative.   Physical  Examination: Filed Vitals:   11/24/14 1320  BP: 158/86  Pulse: 86  Temp: 98.1 F (36.7 C)  Resp: 18   Filed Vitals:   11/24/14 1320  Height: 5\' 6"  (1.676 m)  Weight: 255 lb (115.667 kg)   Body mass index is 41.18 kg/(m^2). Ideal Body Weight: Weight in (lb) to have BMI = 25: 154.6  GEN: WDWN, NAD, Non-toxic, A & O x 3, obese, looks well HEENT: Atraumatic, Normocephalic. Neck supple. No masses, No LAD.  Bilateral TM wnl, oropharynx normal.  PEERL,EOMI.   Ears and Nose: No external deformity. CV: RRR, No M/G/R. No JVD. No thrill. No extra heart sounds. PULM: CTA B, no wheezes, crackles, rhonchi. No retractions. No resp. distress. No accessory muscle use. ABD: S, NT, ND, +BS. No rebound. No HSM. EXTR: No c/c/e NEURO Normal gait.  PSYCH: Normally interactive. Conversant. Not depressed or anxious appearing.  Calm demeanor.  Left foot: he has tenderness over the 1st MTP, pain with movement of the joint.  No current swelling, heat or redness. Placed in a velcro "post-op" shoe that felt really good to him    [facility] reading (PRIMARY) by  Dr. Lorelei Pont. [id number] right sided infiltrate  CHEST 2 VIEW  COMPARISON: 09/18/2014  FINDINGS: Prior median sternotomy. Heart is borderline in size. No confluent airspace opacities or effusions. No acute bony abnormality.  IMPRESSION: No active disease.  Results for orders placed or performed in visit on 11/24/14  POCT CBC  Result Value Ref Range   WBC 9.8 4.6 - 10.2 K/uL   Lymph, poc 2.3 0.6 - 3.4   POC LYMPH PERCENT 23.8 10 - 50 %L   MID (cbc) 0.5 0 - 0.9   POC MID % 4.7 0 - 12 %M   POC Granulocyte 7.0 (A) 2 - 6.9   Granulocyte percent  71.5 37 - 80 %G   RBC 4.33 (A) 4.69 - 6.13 M/uL   Hemoglobin 11.7 (A) 14.1 - 18.1 g/dL   HCT, POC 36.9 (A) 43.5 - 53.7 %   MCV 85.3 80 - 97 fL   MCH, POC 27.1 27 - 31.2 pg   MCHC 31.7 (A) 31.8 - 35.4 g/dL   RDW, POC 13.9 %   Platelet Count, POC 303 142 - 424 K/uL   MPV 7.9 0 - 99.8 fL  POCT  glycosylated hemoglobin (Hb A1C)  Result Value Ref Range   Hemoglobin A1C 10.2    Creat clearance is 32 Assessment and Plan: Acute idiopathic gout of left foot - Plan: Uric Acid, colchicine 0.6 MG tablet  Cough - Plan: DG Chest 2 View, POCT CBC, doxycycline (VIBRAMYCIN) 100 MG capsule  Malaise  Fever, unspecified fever cause - Plan: POCT CBC  CRF (chronic renal failure), unspecified stage - Plan: Comprehensive metabolic panel  Diabetes mellitus type 2, uncontrolled - Plan: Fish Oil OIL, POCT glycosylated hemoglobin (Hb A1C), Ambulatory referral to Endocrinology  Diabetes mellitus due to underlying condition with diabetic nephropathy - Plan: Insulin Glargine (LANTUS SOLOSTAR) 100 UNIT/ML Solostar Pen  Deficiency anemia - Plan: Ambulatory referral to Gastroenterology  Suspect gout as casue of left food pain.  He is somewhat tricky to treat as he had CRF and is on plavix.  Will treat with conservative doses of colchcine and will have him hold his lipid medications while he is on colchcine.  Close follow-up if not better soon Doxycycline for bronchitis.   Referral to GI for colonoscopy Advised him that I do need endocrinology help in managing his DM. He is willing to seek a second opinion from endocrinology    Signed Lamar Blinks, MD

## 2014-12-06 ENCOUNTER — Telehealth: Payer: Self-pay | Admitting: Family Medicine

## 2014-12-06 DIAGNOSIS — R059 Cough, unspecified: Secondary | ICD-10-CM

## 2014-12-06 DIAGNOSIS — R05 Cough: Secondary | ICD-10-CM

## 2014-12-06 MED ORDER — BENZONATATE 100 MG PO CAPS: 100.0000 mg | ORAL_CAPSULE | Freq: Three times a day (TID) | ORAL | Status: DC | PRN

## 2014-12-06 NOTE — Telephone Encounter (Signed)
Called him- I am not sure from his insurance company if his lantus is covered or not, I have received some conflicting information. He has not tried to fill his yet but will let me know what happens when he does try.  We gave him doxycycline for bronchitis at his last visit,  He continues to cough some.  Will add tessalon perles and he will let me know if not feeling better

## 2014-12-20 ENCOUNTER — Telehealth: Payer: Self-pay

## 2014-12-20 ENCOUNTER — Other Ambulatory Visit: Payer: Self-pay | Admitting: Family Medicine

## 2014-12-20 ENCOUNTER — Telehealth: Payer: Self-pay | Admitting: *Deleted

## 2014-12-20 ENCOUNTER — Other Ambulatory Visit: Payer: Self-pay | Admitting: Physician Assistant

## 2014-12-20 DIAGNOSIS — G4733 Obstructive sleep apnea (adult) (pediatric): Secondary | ICD-10-CM

## 2014-12-20 NOTE — Telephone Encounter (Signed)
Ranexa samples placed at the front desk for patient. 

## 2014-12-20 NOTE — Telephone Encounter (Signed)
Pt would like a CB from Dr. Lorelei Pont. Please advise at [phone]

## 2014-12-20 NOTE — Telephone Encounter (Signed)
Called him- I did his ambien rx and faxed to [employer]. I am not sure how he needs me to help with the Cpap machine. Called and [facility]- please let me know exactly what he needs for this machine

## 2014-12-20 NOTE — Telephone Encounter (Signed)
Pt states that he needs to speak with Dr. Lorelei Pont in regards to this medication refill as well as an authorization for a Cpap machine. Would not give me more information that that, please advise.

## 2014-12-21 ENCOUNTER — Other Ambulatory Visit: Payer: Self-pay | Admitting: Endocrinology

## 2014-12-21 ENCOUNTER — Telehealth: Payer: Self-pay | Admitting: Radiology

## 2014-12-21 NOTE — Telephone Encounter (Signed)
Pt will drop off forms from [employer] for Cpap machine.

## 2014-12-21 NOTE — Telephone Encounter (Signed)
Spoke with pt. He needs a CPAP--his went out. He says it went out last week. He has [employer].

## 2014-12-21 NOTE — Telephone Encounter (Signed)
I think this is taken care of?  Let me know if I need to do anything further- JC

## 2014-12-22 ENCOUNTER — Encounter: Payer: Self-pay | Admitting: Family Medicine

## 2014-12-22 NOTE — Telephone Encounter (Signed)
Dr Lorelei Pont, I have been working on this. I contacted [employer] and they advised they do not need pre-authorization for this device as long as it is from in network provider, but CPAP device code is [phone], and case ref # [id number] if needed. One of the supply providers in network ([employer] is out of business). Huey Romans [phone], Fax # [phone] started the order but we have to fax them the following: Rx for CPAP device w/Dx code, [place] has to be on Rx. They also need a copy of sleep study,demographics, ins info and chart notes w/in last 6 mos that document pt's need for CPAP. We do not have a copy of pt's sleep study or chart notes. I called pt and explained what is needed and he agreed to RTC next Thurs to see you to discuss need for CPAP, and also asked me to set up another sleep study because he does not have and doesn't think he can get a copy of previous study. I have put in an order for sleep study.   Pt also wanted me to let you know that both his cough and gout have improved w/Antibiotic and colchicine, but he is out of Abx and thinks that he needs another round to get rid of the cough entirely. He also is almost out of colchicine and thinks he needs some more of that because gout has not completely resolved either. Do you want to RF either?

## 2014-12-23 NOTE — Telephone Encounter (Signed)
Called him back- he did not get my mychart message as he is out of town.  However he does not have any acute concerns and plans to see me next week for a recheck

## 2014-12-28 ENCOUNTER — Other Ambulatory Visit: Payer: Self-pay | Admitting: Physician Assistant

## 2014-12-28 ENCOUNTER — Other Ambulatory Visit: Payer: Self-pay | Admitting: Family Medicine

## 2014-12-30 ENCOUNTER — Other Ambulatory Visit: Payer: Self-pay | Admitting: Endocrinology

## 2014-12-31 ENCOUNTER — Inpatient Hospital Stay (HOSPITAL_COMMUNITY)
Admission: EM | Admit: 2014-12-31 | Discharge: 2015-01-02 | DRG: 303 | Disposition: A | Discharge status: Home / Self Care | Payer: Commercial Managed Care - HMO | Attending: Internal Medicine | Admitting: Internal Medicine

## 2014-12-31 ENCOUNTER — Other Ambulatory Visit (HOSPITAL_COMMUNITY): Payer: Self-pay

## 2014-12-31 ENCOUNTER — Encounter (HOSPITAL_COMMUNITY): Payer: Self-pay | Admitting: Emergency Medicine

## 2014-12-31 ENCOUNTER — Emergency Department (HOSPITAL_COMMUNITY): Payer: Commercial Managed Care - HMO

## 2014-12-31 DIAGNOSIS — Z951 Presence of aortocoronary bypass graft: Secondary | ICD-10-CM

## 2014-12-31 DIAGNOSIS — I25110 Atherosclerotic heart disease of native coronary artery with unstable angina pectoris: Secondary | ICD-10-CM | POA: Diagnosis not present

## 2014-12-31 DIAGNOSIS — K219 Gastro-esophageal reflux disease without esophagitis: Secondary | ICD-10-CM | POA: Diagnosis present

## 2014-12-31 DIAGNOSIS — Z9861 Coronary angioplasty status: Secondary | ICD-10-CM

## 2014-12-31 DIAGNOSIS — E785 Hyperlipidemia, unspecified: Secondary | ICD-10-CM | POA: Diagnosis present

## 2014-12-31 DIAGNOSIS — I2510 Atherosclerotic heart disease of native coronary artery without angina pectoris: Secondary | ICD-10-CM | POA: Diagnosis present

## 2014-12-31 DIAGNOSIS — E669 Obesity, unspecified: Secondary | ICD-10-CM | POA: Diagnosis not present

## 2014-12-31 DIAGNOSIS — N183 Chronic kidney disease, stage 3 (moderate): Secondary | ICD-10-CM | POA: Diagnosis present

## 2014-12-31 DIAGNOSIS — Z7982 Long term (current) use of aspirin: Secondary | ICD-10-CM

## 2014-12-31 DIAGNOSIS — Z955 Presence of coronary angioplasty implant and graft: Secondary | ICD-10-CM | POA: Diagnosis not present

## 2014-12-31 DIAGNOSIS — Z6841 Body Mass Index (BMI) 40.0 and over, adult: Secondary | ICD-10-CM

## 2014-12-31 DIAGNOSIS — Z794 Long term (current) use of insulin: Secondary | ICD-10-CM | POA: Diagnosis not present

## 2014-12-31 DIAGNOSIS — I1310 Hypertensive heart and chronic kidney disease without heart failure, with stage 1 through stage 4 chronic kidney disease, or unspecified chronic kidney disease: Secondary | ICD-10-CM | POA: Diagnosis present

## 2014-12-31 DIAGNOSIS — G4733 Obstructive sleep apnea (adult) (pediatric): Secondary | ICD-10-CM | POA: Diagnosis present

## 2014-12-31 DIAGNOSIS — I10 Essential (primary) hypertension: Secondary | ICD-10-CM | POA: Diagnosis not present

## 2014-12-31 DIAGNOSIS — Z9119 Patient's noncompliance with other medical treatment and regimen: Secondary | ICD-10-CM | POA: Diagnosis present

## 2014-12-31 DIAGNOSIS — I25810 Atherosclerosis of coronary artery bypass graft(s) without angina pectoris: Secondary | ICD-10-CM

## 2014-12-31 DIAGNOSIS — Z79899 Other long term (current) drug therapy: Secondary | ICD-10-CM

## 2014-12-31 DIAGNOSIS — E1121 Type 2 diabetes mellitus with diabetic nephropathy: Secondary | ICD-10-CM | POA: Diagnosis present

## 2014-12-31 DIAGNOSIS — R0789 Other chest pain: Secondary | ICD-10-CM | POA: Diagnosis not present

## 2014-12-31 DIAGNOSIS — Z91199 Patient's noncompliance with other medical treatment and regimen due to unspecified reason: Secondary | ICD-10-CM

## 2014-12-31 DIAGNOSIS — E1159 Type 2 diabetes mellitus with other circulatory complications: Secondary | ICD-10-CM | POA: Diagnosis present

## 2014-12-31 DIAGNOSIS — R079 Chest pain, unspecified: Secondary | ICD-10-CM | POA: Diagnosis not present

## 2014-12-31 DIAGNOSIS — E1169 Type 2 diabetes mellitus with other specified complication: Secondary | ICD-10-CM | POA: Diagnosis present

## 2014-12-31 DIAGNOSIS — Z599 Problem related to housing and economic circumstances, unspecified: Secondary | ICD-10-CM

## 2014-12-31 DIAGNOSIS — E1122 Type 2 diabetes mellitus with diabetic chronic kidney disease: Secondary | ICD-10-CM | POA: Diagnosis not present

## 2014-12-31 DIAGNOSIS — Z9989 Dependence on other enabling machines and devices: Secondary | ICD-10-CM

## 2014-12-31 DIAGNOSIS — R0602 Shortness of breath: Secondary | ICD-10-CM | POA: Diagnosis not present

## 2014-12-31 DIAGNOSIS — I119 Hypertensive heart disease without heart failure: Secondary | ICD-10-CM | POA: Diagnosis present

## 2014-12-31 DIAGNOSIS — I200 Unstable angina: Secondary | ICD-10-CM | POA: Diagnosis present

## 2014-12-31 HISTORY — DX: Hypertensive heart disease without heart failure: I11.9

## 2014-12-31 HISTORY — DX: Personal history of other diseases of the musculoskeletal system and connective tissue: Z87.39

## 2014-12-31 HISTORY — DX: Acute myocardial infarction, unspecified: I21.9

## 2014-12-31 LAB — CBC
HEMATOCRIT: 35.8 % — AB (ref 39.0–52.0)
Hemoglobin: 11.8 g/dL — ABNORMAL LOW (ref 13.0–17.0)
MCH: 27.9 pg (ref 26.0–34.0)
MCHC: 33 g/dL (ref 30.0–36.0)
MCV: 84.6 fL (ref 78.0–100.0)
Platelets: 191 10*3/uL (ref 150–400)
RBC: 4.23 MIL/uL (ref 4.22–5.81)
RDW: 13.7 % (ref 11.5–15.5)
WBC: 8.1 10*3/uL (ref 4.0–10.5)

## 2014-12-31 LAB — PROTIME-INR
INR: 0.94 (ref 0.00–1.49)
Prothrombin Time: 12.7 seconds (ref 11.6–15.2)

## 2014-12-31 LAB — I-STAT TROPONIN, ED: TROPONIN I, POC: 0.01 ng/mL (ref 0.00–0.08)

## 2014-12-31 LAB — BASIC METABOLIC PANEL
ANION GAP: 9 (ref 5–15)
BUN: 33 mg/dL — AB (ref 6–23)
CHLORIDE: 110 mmol/L (ref 96–112)
CO2: 22 mmol/L (ref 19–32)
CREATININE: 2.4 mg/dL — AB (ref 0.50–1.35)
Calcium: 9.5 mg/dL (ref 8.4–10.5)
GFR, EST AFRICAN AMERICAN: 33 mL/min — AB (ref >90)
GFR, EST NON AFRICAN AMERICAN: 29 mL/min — AB (ref >90)
Glucose, Bld: 140 mg/dL — ABNORMAL HIGH (ref 70–99)
Potassium: 4.5 mmol/L (ref 3.5–5.1)
Sodium: 141 mmol/L (ref 135–145)

## 2014-12-31 LAB — BRAIN NATRIURETIC PEPTIDE: B Natriuretic Peptide: 127 pg/mL — ABNORMAL HIGH (ref 0.0–100.0)

## 2014-12-31 MED ORDER — ASPIRIN 81 MG PO CHEW
243.0000 mg | CHEWABLE_TABLET | Freq: Once | ORAL | Status: AC
  Administered 2014-12-31: 243 mg via ORAL
  Filled 2014-12-31: qty 3

## 2014-12-31 MED ORDER — MORPHINE SULFATE 4 MG/ML IJ SOLN
4.0000 mg | Freq: Once | INTRAMUSCULAR | Status: AC
  Administered 2014-12-31: 4 mg via INTRAVENOUS
  Filled 2014-12-31: qty 1

## 2014-12-31 MED ORDER — NITROGLYCERIN IN D5W 200-5 MCG/ML-% IV SOLN
5.0000 ug/min | INTRAVENOUS | Status: DC
  Administered 2014-12-31: 5 ug/min via INTRAVENOUS
  Filled 2014-12-31: qty 250

## 2014-12-31 NOTE — ED Provider Notes (Signed)
CSN: [id number]     Arrival date & time 12/31/14  2234 History   First MD Initiated Contact with Patient 12/31/14 2249     Chief Complaint  Patient presents with  . Chest Pain     (Consider location/radiation/quality/duration/timing/severity/associated sxs/prior Treatment) HPI Comments: Patient with past medical history of significant ACS and CABG presents emergency department with chief complaint of chest pain. Patient states that the pain has been progressively worsening over the past couple of days. States that it became severe at 7:00 night. Reports associated shortness of breath, especially with exertion. Complains of left arm pain occasionally, but not now. Patient was admitted back in January for similar complaint, and has a known RCA lesion which was managed medically secondary to CK D. Patient states that he has been off of his Imdur for the past 2 weeks. He has taken aspirin today, and has also tried sublingual nitroglycerin with no relief. States his pain is 7 out of 10.  The history is provided by the patient. No language interpreter was used.    Past Medical History  Diagnosis Date  . Dyslipidemia   . Coronary artery disease     a. INF MI 1999: PCI of RCA;  b.  extensive stenting of LAD;  c. s/p CABG with RIMA->RCA in 2006;  d. Stable, Low risk MV 05/2012; e. 09/2012 Cath: stable anatomy->med Rx. f. Abnl nuc 11/2013 -> med rx; g. 11/2013 Cath/PCI: LM 30d, LAD 50ost, 20/50isr, 100d, LCX 40-50ost, OM2 50ost, RI 70-80ost sup branch, RCA 20p, 90/60d(2.25x17 & 2.25x24 Promus DES'), PDA 60p, RIMA->RCA ok.  . Hypertension   . Hx of echocardiogram     a. Echo 3/12: mild LVH, EF 55-60%, trivial AI  . DM2 (diabetes mellitus, type 2)   . History of blood transfusion ?2011  . GERD (gastroesophageal reflux disease)     "once; came to hospital" (09/15/2012)  . OSA on CPAP   . CKD (chronic kidney disease), stage III   . Polycystic kidney disease    Past Surgical History  Procedure Laterality  Date  . Cystectomy  1990's    off face  . Coronary angioplasty with stent placement  1999 / 2002 / 2004 / 2006?  Marland Kitchen Cardiac catheterization  2012  . Coronary artery bypass graft  2006    CABG X?; in [place]  . Cardiac catheterization  2013  . Cardiac catheterization  2014    LHC (1/14): total occlusion of distal LAD, diffuse disease in ramus, 70% proximal LCx, 50% proximal and mid RCA, 50% distal RCA, patent RIMA-RCA.  Marland Kitchen Left heart catheterization with coronary/graft angiogram N/A 09/16/2012    Procedure: LEFT HEART CATHETERIZATION WITH [name];  Surgeon: [name], MD;  Location: [facility] CATH LAB;  Service: Cardiovascular;  Laterality: N/A;  . Left heart catheterization with coronary/graft angiogram N/A 12/02/2013    Procedure: LEFT HEART CATHETERIZATION WITH [name];  Surgeon: [name], MD;  Location: [place] CATH LAB;  Service: Cardiovascular;  Laterality: N/A;  . Percutaneous coronary stent intervention (pci-s) N/A 12/03/2013    Procedure: PERCUTANEOUS CORONARY STENT INTERVENTION (PCI-S);  Surgeon: [name], MD;  Location: [facility] CATH LAB;  Service: Cardiovascular;  Laterality: N/A;   Family History  Problem Relation Age of Onset  . Lung cancer Mother   . Anemia Mother   . Polycystic kidney disease Mother   . Diabetes Father   . Heart attack Father 44    Died suddenly  . Heart failure Father   .  Hyperlipidemia Father   . Hypertension Father   . Kidney failure Sister 5    Died  . Heart attack Sister   . Hypertension Sister   . Diabetes Sister   . Diabetes Brother   . Polycystic kidney disease Brother   . Diabetes Sister    History  Substance Use Topics  . Smoking status: Never Smoker   . Smokeless tobacco: Never Used     Comment: smoked some as a teenager (high school)  . Alcohol Use: No     Comment: 09/15/2012 "drank a little when I was young"    Review of Systems  Constitutional: Negative for fever and chills.  Respiratory:  Positive for shortness of breath.   Cardiovascular: Positive for chest pain.  Gastrointestinal: Negative for nausea, vomiting, diarrhea and constipation.  Genitourinary: Negative for dysuria.  All other systems reviewed and are negative.     Allergies  Ciprocin-fluocin-procin; Clarithromycin; Cleocin; and Glimepiride  Home Medications   Prior to Admission medications   Medication Sig Start Date End Date Taking? Authorizing Provider  ACCU-CHEK SOFTCLIX LANCETS lancets Use as instructed to check blood sugar 4 times per day dx code E.11.9 07/01/14   Elayne Snare, MD  amLODipine (NORVASC) 10 MG tablet Take 1 tablet (10 mg total) by mouth daily. 09/19/14   Rogelia Mire, NP  aspirin 81 MG tablet Take 1 tablet (81 mg total) by mouth daily. 12/04/13   Rogelia Mire, NP  B-D UF III MINI PEN NEEDLES 31G X 5 MM MISC USE ONE DAILY FOR INSULIN INJECTION 09/23/14   Gay Filler Copland, MD  benzonatate (TESSALON) 100 MG capsule Take 1 capsule (100 mg total) by mouth 3 (three) times daily as needed for cough. 12/06/14   Darreld Mclean, MD  carvedilol (COREG) 25 MG tablet 2 tablets (50mg ) two times a day 07/07/14   Larey Dresser, MD  clopidogrel (PLAVIX) 75 MG tablet Take 1 tablet (75 mg total) by mouth daily with breakfast. 12/25/13   Darreld Mclean, MD  colchicine 0.6 MG tablet Take 2 pills today, then may take 1 pill a day for the next 2-3 days if needed for gout 11/24/14   Gay Filler Copland, MD  diphenhydramine-acetaminophen (TYLENOL PM) 25-500 MG TABS Take 2 tablets by mouth at bedtime.    Historical Provider, MD  fenofibrate (TRICOR) 145 MG tablet Take 1 tablet (145 mg total) by mouth daily. 11/08/14   Larey Dresser, MD  Fish Oil OIL by Does not apply route.    Historical Provider, MD  gabapentin (NEURONTIN) 300 MG capsule Take 1 capsule (300 mg total) by mouth 3 (three) times daily. 12/25/13   Gay Filler Copland, MD  glucose blood (ACCU-CHEK AVIVA PLUS) test strip Use as instructed to  check blood sugar 4 times per day dx code E11.9 07/01/14   Elayne Snare, MD  Insulin Glargine (LANTUS SOLOSTAR) 100 UNIT/ML Solostar Pen Take 28 units daily for diabetes 11/24/14   Gay Filler Copland, MD  insulin NPH-regular Human (NOVOLIN 70/30 RELION) (70-30) 100 UNIT/ML injection Inject 15 Units into the skin 3 (three) times daily. 08/10/14   Elayne Snare, MD  Insulin Syringe-Needle U-100 (INSULIN SYRINGE .5CC/31GX5/16") 31G X 5/16" 0.5 ML MISC Use one needle three times per day 05/26/14   Elayne Snare, MD  isosorbide mononitrate (IMDUR) 120 MG 24 hr tablet Take 1 tablet (120 mg total) by mouth daily. 09/19/14   Rogelia Mire, NP  isosorbide mononitrate (IMDUR) 60 MG 24 hr tablet  TAKE 1 TABLET TWICE DAILY 12/20/14   Darreld Mclean, MD  losartan (COZAAR) 100 MG tablet TAKE 1 TABLET EVERY DAY 11/23/14   Darreld Mclean, MD  Multiple Vitamin (MULTIVITAMIN WITH MINERALS) TABS tablet Take 1 tablet by mouth daily.    Historical Provider, MD  nitroGLYCERIN (NITROSTAT) 0.4 MG SL tablet Place 1 tablet (0.4 mg total) under the tongue every 5 (five) minutes as needed for chest pain. 11/03/14   Larey Dresser, MD  pantoprazole (PROTONIX) 40 MG tablet Take 1 tablet (40 mg total) by mouth 2 (two) times daily with a meal. 12/25/13 12/25/14  Gay Filler Copland, MD  pravastatin (PRAVACHOL) 40 MG tablet TAKE 1 TABLET EVERY EVENING 12/20/14   Gay Filler Copland, MD  ranolazine (RANEXA) 1000 MG SR tablet Take 1 tablet (1,000 mg total) by mouth 2 (two) times daily. 09/19/14   Rogelia Mire, NP  zolpidem (AMBIEN) 10 MG tablet TAKE 1 TABLET AT BEDTIME 12/20/14   Gay Filler Copland, MD   BP 195/104 mmHg  Pulse 87  Temp(Src) 98.2 F (36.8 C) (Oral)  Resp 29  SpO2 100% Physical Exam  Constitutional: He is oriented to person, place, and time. He appears well-developed and well-nourished.  HENT:  Head: Normocephalic and atraumatic.  Eyes: Conjunctivae and EOM are normal. Pupils are equal, round, and reactive to light.  Right eye exhibits no discharge. Left eye exhibits no discharge. No scleral icterus.  Neck: Normal range of motion. Neck supple. No JVD present.  Cardiovascular: Normal rate, regular rhythm and normal heart sounds.  Exam reveals no gallop and no friction rub.   No murmur heard. Pulmonary/Chest: Effort normal and breath sounds normal. No respiratory distress. He has no wheezes. He has no rales. He exhibits no tenderness.  Abdominal: Soft. He exhibits no distension and no mass. There is no tenderness. There is no rebound and no guarding.  Musculoskeletal: Normal range of motion. He exhibits no edema or tenderness.  Neurological: He is alert and oriented to person, place, and time.  Skin: Skin is warm and dry.  Psychiatric: He has a normal mood and affect. His behavior is normal. Judgment and thought content normal.  Nursing note and vitals reviewed.   ED Course  Procedures (including critical care time) Results for orders placed or performed during the hospital encounter of 12/31/14  CBC  Result Value Ref Range   WBC 8.1 4.0 - 10.5 K/uL   RBC 4.23 4.22 - 5.81 MIL/uL   Hemoglobin 11.8 (L) 13.0 - 17.0 g/dL   HCT 35.8 (L) 39.0 - 52.0 %   MCV 84.6 78.0 - 100.0 fL   MCH 27.9 26.0 - 34.0 pg   MCHC 33.0 30.0 - 36.0 g/dL   RDW 13.7 11.5 - 15.5 %   Platelets 191 150 - 400 K/uL  Basic metabolic panel  Result Value Ref Range   Sodium 141 135 - 145 mmol/L   Potassium 4.5 3.5 - 5.1 mmol/L   Chloride 110 96 - 112 mmol/L   CO2 22 19 - 32 mmol/L   Glucose, Bld 140 (H) 70 - 99 mg/dL   BUN 33 (H) 6 - 23 mg/dL   Creatinine, Ser 2.40 (H) 0.50 - 1.35 mg/dL   Calcium 9.5 8.4 - 10.5 mg/dL   GFR calc non Af Amer 29 (L) >90 mL/min   GFR calc Af Amer 33 (L) >90 mL/min   Anion gap 9 5 - 15  BNP (order ONLY if patient complains of dyspnea/SOB AND you  have documented it for THIS visit)  Result Value Ref Range   B Natriuretic Peptide 127.0 (H) 0.0 - 100.0 pg/mL  Protime-INR (if pt is taking Coumadin)   Result Value Ref Range   Prothrombin Time 12.7 11.6 - 15.2 seconds   INR 0.94 0.00 - 1.49  I-stat troponin, ED (not at [facility])  Result Value Ref Range   Troponin i, poc 0.01 0.00 - 0.08 ng/mL   Comment 3           Dg Chest 2 View  01/01/2015   CLINICAL DATA:  Chest pain.  Shortness of breath with exertion.  EXAM: CHEST  2 VIEW  COMPARISON:  11/24/2014  FINDINGS: Heart size and pulmonary vascularity are normal. Lungs are clear except for slight linear scarring at the left lung base and slight chronic peribronchial thickening. Prior median sternotomy. No acute osseous abnormality.  IMPRESSION: No acute abnormalities.   Electronically Signed   By: Lorriane Shire M.D.   On: 01/01/2015 00:10     Imaging Review No results found.   EKG Interpretation None      MDM   Final diagnoses:  Chest pain, unspecified chest pain type    Patient with chest pain, and shortness of breath. Patient seen by and discussed with Dr. Jeneen Rinks, who agrees that patient will need to be admitted and recommend cardiology consultation. Will start nitro drip.  Patient discussed with Dr. Oleta Mouse from cardiology, who will the patient. Requests that I was orders for a stepdown bed.  CRITICAL CARE Performed by: Montine Circle   Total critical care time: 30  Critical care time was exclusive of separately billable procedures and treating other patients.  Critical care was necessary to treat or prevent imminent or life-threatening deterioration.  Critical care was time spent personally by me on the following activities: development of treatment plan with patient and/or surrogate as well as nursing, discussions with consultants, evaluation of patient's response to treatment, examination of patient, obtaining history from patient or surrogate, ordering and performing treatments and interventions, ordering and review of laboratory studies, ordering and review of radiographic studies, pulse oximetry and re-evaluation of  patient's condition.     Montine Circle, PA-C 01/01/15 0024  Tanna Furry, MD 01/01/15 [phone]

## 2014-12-31 NOTE — ED Notes (Signed)
Pt. reports intermittent central chest pain with SOB onset this evening , denies nausea or diaphoresis , pt. took 2 NTG sl prior to arrival with slight relief. History of CAD /CABG his cardiologist is Dr. Trey Paula.

## 2014-12-31 NOTE — ED Provider Notes (Signed)
Patient seen and evaluated. Care discussed with Lorre Munroe PA. Patient with a history of significant cardiac disease. Residual distal RCA stenosis. Admitted in January. Ruled out. Did not undergo catheterization because of chronic kidney disease. Then off of his Imdur for at least the last week. Report episodes of forms of breath chest pain starting earlier this evening. He is taken total of 5 nitroglycerin prior to arrival. He states he gets intermittent relief with them, but incomplete.  EKG shows no acute changes. IV is being placed. Plan will be nitroglycerin drip. Pain control. Enzyme evaluation. Cardiology consultation.  Tanna Furry, MD 12/31/14 [phone]

## 2015-01-01 ENCOUNTER — Encounter (HOSPITAL_COMMUNITY): Payer: Self-pay | Admitting: General Practice

## 2015-01-01 DIAGNOSIS — E785 Hyperlipidemia, unspecified: Secondary | ICD-10-CM | POA: Diagnosis not present

## 2015-01-01 DIAGNOSIS — I1310 Hypertensive heart and chronic kidney disease without heart failure, with stage 1 through stage 4 chronic kidney disease, or unspecified chronic kidney disease: Secondary | ICD-10-CM | POA: Diagnosis not present

## 2015-01-01 DIAGNOSIS — I10 Essential (primary) hypertension: Secondary | ICD-10-CM | POA: Diagnosis not present

## 2015-01-01 DIAGNOSIS — Z7982 Long term (current) use of aspirin: Secondary | ICD-10-CM | POA: Diagnosis not present

## 2015-01-01 DIAGNOSIS — Z955 Presence of coronary angioplasty implant and graft: Secondary | ICD-10-CM | POA: Diagnosis not present

## 2015-01-01 DIAGNOSIS — I2510 Atherosclerotic heart disease of native coronary artery without angina pectoris: Secondary | ICD-10-CM | POA: Diagnosis present

## 2015-01-01 DIAGNOSIS — Z9861 Coronary angioplasty status: Secondary | ICD-10-CM

## 2015-01-01 DIAGNOSIS — Z79899 Other long term (current) drug therapy: Secondary | ICD-10-CM | POA: Diagnosis not present

## 2015-01-01 DIAGNOSIS — Z794 Long term (current) use of insulin: Secondary | ICD-10-CM | POA: Diagnosis not present

## 2015-01-01 DIAGNOSIS — I119 Hypertensive heart disease without heart failure: Secondary | ICD-10-CM | POA: Diagnosis present

## 2015-01-01 DIAGNOSIS — E669 Obesity, unspecified: Secondary | ICD-10-CM | POA: Diagnosis not present

## 2015-01-01 DIAGNOSIS — K219 Gastro-esophageal reflux disease without esophagitis: Secondary | ICD-10-CM | POA: Diagnosis present

## 2015-01-01 DIAGNOSIS — E1122 Type 2 diabetes mellitus with diabetic chronic kidney disease: Secondary | ICD-10-CM | POA: Diagnosis not present

## 2015-01-01 DIAGNOSIS — Z9119 Patient's noncompliance with other medical treatment and regimen: Secondary | ICD-10-CM | POA: Diagnosis present

## 2015-01-01 DIAGNOSIS — I25110 Atherosclerotic heart disease of native coronary artery with unstable angina pectoris: Secondary | ICD-10-CM | POA: Diagnosis not present

## 2015-01-01 DIAGNOSIS — G4733 Obstructive sleep apnea (adult) (pediatric): Secondary | ICD-10-CM | POA: Diagnosis present

## 2015-01-01 DIAGNOSIS — R079 Chest pain, unspecified: Secondary | ICD-10-CM | POA: Diagnosis not present

## 2015-01-01 DIAGNOSIS — N183 Chronic kidney disease, stage 3 (moderate): Secondary | ICD-10-CM | POA: Diagnosis present

## 2015-01-01 DIAGNOSIS — Z599 Problem related to housing and economic circumstances, unspecified: Secondary | ICD-10-CM | POA: Diagnosis not present

## 2015-01-01 DIAGNOSIS — R0602 Shortness of breath: Secondary | ICD-10-CM | POA: Diagnosis not present

## 2015-01-01 DIAGNOSIS — Z951 Presence of aortocoronary bypass graft: Secondary | ICD-10-CM | POA: Diagnosis not present

## 2015-01-01 DIAGNOSIS — Z6841 Body Mass Index (BMI) 40.0 and over, adult: Secondary | ICD-10-CM | POA: Diagnosis not present

## 2015-01-01 LAB — CBC
HCT: 34.6 % — ABNORMAL LOW (ref 39.0–52.0)
HEMATOCRIT: 35 % — AB (ref 39.0–52.0)
HEMOGLOBIN: 11.5 g/dL — AB (ref 13.0–17.0)
HEMOGLOBIN: 11.4 g/dL — AB (ref 13.0–17.0)
MCH: 27.8 pg (ref 26.0–34.0)
MCH: 27.9 pg (ref 26.0–34.0)
MCHC: 32.9 g/dL (ref 30.0–36.0)
MCHC: 32.9 g/dL (ref 30.0–36.0)
MCV: 84.5 fL (ref 78.0–100.0)
MCV: 84.8 fL (ref 78.0–100.0)
Platelets: 185 10*3/uL (ref 150–400)
Platelets: 170 10*3/uL (ref 150–400)
RBC: 4.14 MIL/uL — ABNORMAL LOW (ref 4.22–5.81)
RBC: 4.08 MIL/uL — ABNORMAL LOW (ref 4.22–5.81)
RDW: 13.8 % (ref 11.5–15.5)
RDW: 13.9 % (ref 11.5–15.5)
WBC: 9 10*3/uL (ref 4.0–10.5)
WBC: 8.8 10*3/uL (ref 4.0–10.5)

## 2015-01-01 LAB — TROPONIN I
Troponin I: <0.03 ng/mL (ref <0.031)
Troponin I: <0.03 ng/mL (ref <0.031)

## 2015-01-01 LAB — GLUCOSE, CAPILLARY
Glucose-Capillary: 88 mg/dL (ref 70–99)
Glucose-Capillary: 97 mg/dL (ref 70–99)
Glucose-Capillary: 267 mg/dL — ABNORMAL HIGH (ref 70–99)
Glucose-Capillary: 269 mg/dL — ABNORMAL HIGH (ref 70–99)

## 2015-01-01 LAB — BASIC METABOLIC PANEL
Anion gap: 9 (ref 5–15)
BUN: 32 mg/dL — AB (ref 6–23)
CO2: 20 mmol/L (ref 19–32)
CREATININE: 2.19 mg/dL — AB (ref 0.50–1.35)
Calcium: 9.5 mg/dL (ref 8.4–10.5)
Chloride: 113 mmol/L — ABNORMAL HIGH (ref 96–112)
GFR calc Af Amer: 37 mL/min — ABNORMAL LOW (ref >90)
GFR, EST NON AFRICAN AMERICAN: 32 mL/min — AB (ref >90)
GLUCOSE: 75 mg/dL (ref 70–99)
Potassium: 4.4 mmol/L (ref 3.5–5.1)
SODIUM: 142 mmol/L (ref 135–145)

## 2015-01-01 LAB — LIPID PANEL
Cholesterol: 217 mg/dL — ABNORMAL HIGH (ref 0–200)
HDL: 27 mg/dL — ABNORMAL LOW (ref >39)
LDL CALC: UNDETERMINED mg/dL (ref 0–99)
Total CHOL/HDL Ratio: 8 RATIO
Triglycerides: 526 mg/dL — ABNORMAL HIGH (ref <150)
VLDL: UNDETERMINED mg/dL (ref 0–40)

## 2015-01-01 LAB — MRSA PCR SCREENING: MRSA BY PCR: NEGATIVE

## 2015-01-01 LAB — PROTIME-INR
INR: 0.95 (ref 0.00–1.49)
Prothrombin Time: 12.8 seconds (ref 11.6–15.2)

## 2015-01-01 MED ORDER — ASPIRIN 325 MG PO TABS: 81.0000 mg | ORAL_TABLET | Freq: Every day | ORAL | Status: DC

## 2015-01-01 MED ORDER — GABAPENTIN 300 MG PO CAPS
300.0000 mg | ORAL_CAPSULE | Freq: Three times a day (TID) | ORAL | Status: DC
  Administered 2015-01-01 – 2015-01-02 (×4): 300 mg via ORAL
  Filled 2015-01-01 (×4): qty 1

## 2015-01-01 MED ORDER — ACETAMINOPHEN 325 MG PO TABS: 650.0000 mg | ORAL_TABLET | ORAL | Status: DC | PRN

## 2015-01-01 MED ORDER — INSULIN GLARGINE 100 UNIT/ML ~~LOC~~ SOLN
28.0000 [IU] | Freq: Every day | SUBCUTANEOUS | Status: DC
  Filled 2015-01-01: qty 0.28

## 2015-01-01 MED ORDER — CLOPIDOGREL BISULFATE 75 MG PO TABS
75.0000 mg | ORAL_TABLET | Freq: Every day | ORAL | Status: DC
  Administered 2015-01-01 – 2015-01-02 (×2): 75 mg via ORAL
  Filled 2015-01-01 (×2): qty 1

## 2015-01-01 MED ORDER — ASPIRIN EC 81 MG PO TBEC
81.0000 mg | DELAYED_RELEASE_TABLET | Freq: Every day | ORAL | Status: DC
  Administered 2015-01-02: 81 mg via ORAL
  Filled 2015-01-01 (×2): qty 1

## 2015-01-01 MED ORDER — INSULIN ASPART 100 UNIT/ML ~~LOC~~ SOLN
0.0000 [IU] | Freq: Three times a day (TID) | SUBCUTANEOUS | Status: DC
  Administered 2015-01-01 – 2015-01-02 (×2): 5 [IU] via SUBCUTANEOUS

## 2015-01-01 MED ORDER — LOSARTAN POTASSIUM 50 MG PO TABS
100.0000 mg | ORAL_TABLET | Freq: Every day | ORAL | Status: DC
  Administered 2015-01-01 – 2015-01-02 (×2): 100 mg via ORAL
  Filled 2015-01-01 (×2): qty 2

## 2015-01-01 MED ORDER — RANOLAZINE ER 500 MG PO TB12
1000.0000 mg | ORAL_TABLET | Freq: Two times a day (BID) | ORAL | Status: DC
Start: 2015-01-01 — End: 2015-01-02
  Administered 2015-01-01 – 2015-01-02 (×4): 1000 mg via ORAL
  Filled 2015-01-01 (×4): qty 2

## 2015-01-01 MED ORDER — NITROGLYCERIN 0.4 MG SL SUBL: 0.4000 mg | SUBLINGUAL_TABLET | SUBLINGUAL | Status: DC | PRN

## 2015-01-01 MED ORDER — FENOFIBRATE 160 MG PO TABS
160.0000 mg | ORAL_TABLET | Freq: Every day | ORAL | Status: DC
  Administered 2015-01-01 – 2015-01-02 (×2): 160 mg via ORAL
  Filled 2015-01-01 (×2): qty 1

## 2015-01-01 MED ORDER — HEPARIN SODIUM (PORCINE) 5000 UNIT/ML IJ SOLN
5000.0000 [IU] | Freq: Three times a day (TID) | INTRAMUSCULAR | Status: DC
  Filled 2015-01-01: qty 1

## 2015-01-01 MED ORDER — ADULT MULTIVITAMIN W/MINERALS CH
1.0000 | ORAL_TABLET | Freq: Every day | ORAL | Status: DC
  Administered 2015-01-01 – 2015-01-02 (×2): 1 via ORAL
  Filled 2015-01-01 (×2): qty 1

## 2015-01-01 MED ORDER — ONDANSETRON HCL 4 MG/2ML IJ SOLN: 4.0000 mg | Freq: Four times a day (QID) | INTRAMUSCULAR | Status: DC | PRN

## 2015-01-01 MED ORDER — CARVEDILOL 25 MG PO TABS
50.0000 mg | ORAL_TABLET | Freq: Two times a day (BID) | ORAL | Status: DC
  Administered 2015-01-01 – 2015-01-02 (×3): 50 mg via ORAL
  Filled 2015-01-01 (×3): qty 2

## 2015-01-01 MED ORDER — INSULIN GLARGINE 100 UNIT/ML ~~LOC~~ SOLN
28.0000 [IU] | Freq: Every day | SUBCUTANEOUS | Status: DC
  Administered 2015-01-01: 28 [IU] via SUBCUTANEOUS
  Filled 2015-01-01 (×2): qty 0.28

## 2015-01-01 MED ORDER — AMLODIPINE BESYLATE 10 MG PO TABS
10.0000 mg | ORAL_TABLET | Freq: Every day | ORAL | Status: DC
  Administered 2015-01-01 – 2015-01-02 (×3): 10 mg via ORAL
  Filled 2015-01-01 (×3): qty 1

## 2015-01-01 MED ORDER — PRAVASTATIN SODIUM 40 MG PO TABS
40.0000 mg | ORAL_TABLET | Freq: Every evening | ORAL | Status: DC
  Administered 2015-01-01: 40 mg via ORAL
  Filled 2015-01-01: qty 1

## 2015-01-01 MED ORDER — PANTOPRAZOLE SODIUM 40 MG PO TBEC
40.0000 mg | DELAYED_RELEASE_TABLET | Freq: Two times a day (BID) | ORAL | Status: DC
  Administered 2015-01-01 – 2015-01-02 (×3): 40 mg via ORAL
  Filled 2015-01-01 (×3): qty 1

## 2015-01-01 MED ORDER — ZOLPIDEM TARTRATE 5 MG PO TABS
10.0000 mg | ORAL_TABLET | Freq: Every day | ORAL | Status: DC
  Administered 2015-01-01: 10 mg via ORAL
  Filled 2015-01-01: qty 2

## 2015-01-01 NOTE — Progress Notes (Signed)
MD Viviana Simpler notified of continued high blood pressures while on IV nitroglycerin drip, telephone order received to administer 10am 01/01/15 dose of Norvasc at 0430 4/23.

## 2015-01-01 NOTE — H&P (Addendum)
Physician History and Physical    Craig MRN: [id number] DOB/AGE: [birth year] 54 y.o. Admit date: 12/31/2014   Primary Cardiologist:  Aundra Dubin  CC:  Chest pain, hypertensive urgency   HPI:  54 y/o male with an extensive h/o CAD s/p CABG RIMA to RCA with subsequent LAD and distal RCA stenting, most recently in March 2015, HTN, poor medical compliance, history of admission with chest pain/hypertion urgency due to non-compliance, and Stage III CKD not on HD, who presented with progressively worsening chest pain. Pt ran out of his medications due to financial issues and was found with BP 190s in ER. He missed appt with Dr. Aundra Dubin in the past.   In ER, Toponin was negative and ECG showed no acute changes. He was having refractory chest pain and Nitro gtt was initiated for both BP and CP.   Upon my interview, he is CP free with Nitro @ 50.    Review of systems: A review of 10 organ systems was done and is negative except as stated above in HPI  Past Medical History  Diagnosis Date  . Dyslipidemia   . Coronary artery disease     a. INF MI 1999: PCI of RCA;  b.  extensive stenting of LAD;  c. s/p CABG with RIMA->RCA in 2006;  d. Stable, Low risk MV 05/2012; e. 09/2012 Cath: stable anatomy->med Rx. f. Abnl nuc 11/2013 -> med rx; g. 11/2013 Cath/PCI: LM 30d, LAD 50ost, 20/50isr, 100d, LCX 40-50ost, OM2 50ost, RI 70-80ost sup branch, RCA 20p, 90/60d(2.25x17 & 2.25x24 Promus DES'), PDA 60p, RIMA->RCA ok.  . Hypertension   . Hx of echocardiogram     a. Echo 3/12: mild LVH, EF 55-60%, trivial AI  . DM2 (diabetes mellitus, type 2)   . History of blood transfusion ?2011  . GERD (gastroesophageal reflux disease)     "once; came to hospital" (09/15/2012)  . OSA on CPAP   . CKD (chronic kidney disease), stage III   . Polycystic kidney disease    Past Surgical History  Procedure Laterality Date  . Cystectomy  1990's    off face  . Coronary angioplasty with stent placement  1999 / 2002 /  2004 / 2006?  Marland Kitchen Cardiac catheterization  2012  . Coronary artery bypass graft  2006    CABG X?; in [place]  . Cardiac catheterization  2013  . Cardiac catheterization  2014    LHC (1/14): total occlusion of distal LAD, diffuse disease in ramus, 70% proximal LCx, 50% proximal and mid RCA, 50% distal RCA, patent RIMA-RCA.  Marland Kitchen Left heart catheterization with coronary/graft angiogram N/A 09/16/2012    Procedure: LEFT HEART CATHETERIZATION WITH [name];  Surgeon: [name], MD;  Location: [facility] CATH LAB;  Service: Cardiovascular;  Laterality: N/A;  . Left heart catheterization with coronary/graft angiogram N/A 12/02/2013    Procedure: LEFT HEART CATHETERIZATION WITH [name];  Surgeon: [name], MD;  Location: [place] CATH LAB;  Service: Cardiovascular;  Laterality: N/A;  . Percutaneous coronary stent intervention (pci-s) N/A 12/03/2013    Procedure: PERCUTANEOUS CORONARY STENT INTERVENTION (PCI-S);  Surgeon: [name], MD;  Location: [facility] C[facility] LAB;  Service: Cardiovascular;  Laterality: N/A;   History   Social History  . Marital Status: Married    Spouse Name: [name]  . Number of Children: 5  . Years of Education: N/A   Occupational History  . retired     Chartered loss adjuster   Social History Main Topics  .  Smoking status: Never Smoker   . Smokeless tobacco: Never Used     Comment: smoked some as a teenager (high school)  . Alcohol Use: No     Comment: 09/15/2012 "drank a little when I was young"  . Drug Use: No  . Sexual Activity: Yes   Other Topics Concern  . Not on file   Social History Narrative   Married and lives with wife in [place]. On disability.     Family History  Problem Relation Age of Onset  . Lung cancer Mother   . Anemia Mother   . Polycystic kidney disease Mother   . Diabetes Father   . Heart attack Father 21    Died suddenly  . Heart failure Father   . Hyperlipidemia Father   . Hypertension Father   . Kidney failure  Sister 5    Died  . Heart attack Sister   . Hypertension Sister   . Diabetes Sister   . Diabetes Brother   . Polycystic kidney disease Brother   . Diabetes Sister      Allergies  Allergen Reactions  . Ciprocin-Fluocin-Procin [Fluocinolone Acetonide] Rash  . Clarithromycin Itching and Other (See Comments)    "Biaxin" Eyes itch and burn  . Cleocin [Clindamycin Hcl] Anaphylaxis and Swelling  . Glimepiride [Amaryl] Other (See Comments)    Elevates liver function      (Not in a hospital admission)   Current facility-administered medications:  .  nitroGLYCERIN 50 mg in dextrose 5 % 250 mL (0.2 mg/mL) infusion, 5-200 mcg/min, Intravenous, Titrated, Montine Circle, PA-C, Last Rate: 3 mL/hr at 01/01/15 0017, 10 mcg/min at 01/01/15 0017  Current outpatient prescriptions:  .  amLODipine (NORVASC) 10 MG tablet, Take 1 tablet (10 mg total) by mouth daily., Disp: 30 tablet, Rfl: 6 .  aspirin 81 MG tablet, Take 1 tablet (81 mg total) by mouth daily., Disp: , Rfl:  .  carvedilol (COREG) 25 MG tablet, 2 tablets (50mg ) two times a day (Patient taking differently: Take 50 mg by mouth 2 (two) times daily with a meal. 2 tablets (50mg ) two times a day), Disp: 120 tablet, Rfl: 6 .  clopidogrel (PLAVIX) 75 MG tablet, Take 1 tablet (75 mg total) by mouth daily with breakfast., Disp: 90 tablet, Rfl: 3 .  colchicine 0.6 MG tablet, Take 2 pills today, then may take 1 pill a day for the next 2-3 days if needed for gout, Disp: 30 tablet, Rfl: 0 .  fenofibrate (TRICOR) 145 MG tablet, Take 1 tablet (145 mg total) by mouth daily., Disp: 90 tablet, Rfl: 3 .  Fish Oil OIL, Take 1 tablet by mouth daily. , Disp: , Rfl:  .  gabapentin (NEURONTIN) 300 MG capsule, Take 1 capsule (300 mg total) by mouth 3 (three) times daily., Disp: 270 capsule, Rfl: 3 .  Insulin Glargine (LANTUS SOLOSTAR) 100 UNIT/ML Solostar Pen, Take 28 units daily for diabetes (Patient taking differently: Inject 28 Units into the skin daily at 10  pm. Take 28 units daily for diabetes), Disp: 5 pen, Rfl: 6 .  insulin NPH-regular Human (NOVOLIN 70/30 RELION) (70-30) 100 UNIT/ML injection, Inject 15 Units into the skin 3 (three) times daily., Disp: 20 mL, Rfl: 2 .  isosorbide mononitrate (IMDUR) 60 MG 24 hr tablet, TAKE 1 TABLET TWICE DAILY, Disp: 180 tablet, Rfl: 3 .  losartan (COZAAR) 100 MG tablet, TAKE 1 TABLET EVERY DAY, Disp: 90 tablet, Rfl: 0 .  Multiple Vitamin (MULTIVITAMIN WITH MINERALS) TABS tablet, Take 1  tablet by mouth daily., Disp: , Rfl:  .  nitroGLYCERIN (NITROSTAT) 0.4 MG SL tablet, Place 1 tablet (0.4 mg total) under the tongue every 5 (five) minutes as needed for chest pain., Disp: 25 tablet, Rfl: 11 .  pantoprazole (PROTONIX) 40 MG tablet, Take 1 tablet (40 mg total) by mouth 2 (two) times daily with a meal., Disp: 180 tablet, Rfl: 3 .  pravastatin (PRAVACHOL) 40 MG tablet, TAKE 1 TABLET EVERY EVENING, Disp: 90 tablet, Rfl: 1 .  ranolazine (RANEXA) 1000 MG SR tablet, Take 1 tablet (1,000 mg total) by mouth 2 (two) times daily., Disp: 30 tablet, Rfl: 6 .  zolpidem (AMBIEN) 10 MG tablet, TAKE 1 TABLET AT BEDTIME, Disp: 90 tablet, Rfl: 1 .  ACCU-CHEK SOFTCLIX LANCETS lancets, Use as instructed to check blood sugar 4 times per day dx code E.11.9, Disp: 200 each, Rfl: 3 .  B-D UF III MINI PEN NEEDLES 31G X 5 MM MISC, USE ONE DAILY FOR INSULIN INJECTION, Disp: 90 each, Rfl: 1 .  benzonatate (TESSALON) 100 MG capsule, Take 1 capsule (100 mg total) by mouth 3 (three) times daily as needed for cough. (Patient not taking: Reported on 12/31/2014), Disp: 40 capsule, Rfl: 0 .  glucose blood (ACCU-CHEK AVIVA PLUS) test strip, Use as instructed to check blood sugar 4 times per day dx code E11.9, Disp: 125 each, Rfl: 3 .  Insulin Syringe-Needle U-100 (INSULIN SYRINGE .5CC/31GX5/16") 31G X 5/16" 0.5 ML MISC, Use one needle three times per day, Disp: 100 each, Rfl: 3 .  isosorbide mononitrate (IMDUR) 120 MG 24 hr tablet, Take 1 tablet (120 mg  total) by mouth daily. (Patient not taking: Reported on 12/31/2014), Disp: 30 tablet, Rfl: 6  Physical Exam: Blood pressure 189/103, pulse 88, temperature 98.2 F (36.8 C), temperature source Oral, resp. rate 23, SpO2 99 %.; There is no weight on file to calculate BMI. Temp:  [98.2 F (36.8 C)] 98.2 F (36.8 C) (04/22 2242) Pulse Rate:  [84-91] 88 (04/23 0000) Resp:  [20-29] 23 (04/23 0000) BP: (173-195)/(103-104) 189/103 mmHg (04/23 0000) SpO2:  [97 %-100 %] 99 % (04/23 0000)  No intake or output data in the 24 hours ending 01/01/15 0017 General: NAD Heent: MMM Neck: No JVD  CV: Nondisplaced PMI.  RRR, nl S1/S2, no S3/S4, no murmur. No carotid bruit   Lungs: Clear to auscultation bilaterally with normal respiratory effort Abdomen: Soft, nontender, nondistended Extremities: No clubbing or cyanosis.  Normal pedal pulses. No pedal edema Skin: Intact without lesions or rashes  Neurologic: Alert and oriented x 3, grossly nonfocal  Psych: Normal mood and affect    Labs: No results for input(s): CKTOTAL, CKMB, TROPONINI in the last 72 hours. Lab Results  Component Value Date   WBC 8.1 12/31/2014   HGB 11.8* 12/31/2014   HCT 35.8* 12/31/2014   MCV 84.6 12/31/2014   PLT 191 12/31/2014    Recent Labs Lab 12/31/14 2251  NA 141  K 4.5  CL 110  CO2 22  BUN 33*  CREATININE 2.40*  CALCIUM 9.5  GLUCOSE 140*   Lab Results  Component Value Date   CHOL 197 11/03/2014   HDL 33.00* 11/03/2014   LDLCALC UNABLE TO CALCULATE IF TRIGLYCERIDE OVER 400 mg/dL 09/19/2014   TRIG * 11/03/2014    511.0 Triglyceride is over 400; calculations on Lipids are invalid.       EKG:  NSR with anterolateral infarct.   Radiology:  Dg Chest 2 View  01/01/2015   CLINICAL DATA:  Chest pain.  Shortness of breath with exertion.  EXAM: CHEST  2 VIEW  COMPARISON:  11/24/2014  FINDINGS: Heart size and pulmonary vascularity are normal. Lungs are clear except for slight linear scarring at the left lung base  and slight chronic peribronchial thickening. Prior median sternotomy. No acute osseous abnormality.  IMPRESSION: No acute abnormalities.   Electronically Signed   By: Lorriane Shire M.D.   On: 01/01/2015 00:10    ASSESSMENT:  55 y/o male with an extensive h/o CAD s/p CABG RIMA to RCA with subsequent LAD and distal RCA stenting, most recently in March 2015, HTN, poor medical compliance, history of admission with chest pain/hypertion urgency due to non-compliance, and Stage III CKD not on HD, who is being admitted for:  1. Anginal chest pain like due to HTN urgency in the setting of medical non-adherence. 2. HTN urgency 3. Stage III CKD not on HD  PLAN:  1. Nitro gtt for both #1 and #2. Cycle troponins 2. Heparin gtt if troponin elevated.  3. Resume home PO GDMT for CAD, pt was not able to take these due to financial issues.  4. If he is ruled out for MI, due to his multiple comobidities and medical nonadherence, especially remarkable risk of CI-AKI, he is a candidate of optimizing medical therapy, with emphasis on medical compliance.  5. Poor candidate for LHC/PCI, due to persistent medical noncompliance and CKD.    Signed: Manus Gunning, MD Cardiology Fellow 01/01/2015, 12:17 AM

## 2015-01-01 NOTE — ED Notes (Signed)
Appt time 0115

## 2015-01-01 NOTE — ED Notes (Signed)
Attempted to get appt, Charge RN will call back

## 2015-01-01 NOTE — Progress Notes (Signed)
Subjective:  Admitted last night with progressive chest pain in the setting of being off of the number of his medications due to not having received in from the mail order pharmacy.  Says he wants to eat this morning and feels much better.  No complaints of chest pain or shortness of breath today.  Initial troponin was negative.  Repeat troponins are pending.  Objective:  Vital Signs in the last 24 hours: BP 161/96 mmHg  Pulse 81  Temp(Src) 98 F (36.7 C) (Oral)  Resp 20  Ht 5\' 5"  (1.651 m)  Wt 116.03 kg (255 lb 12.8 oz)  BMI 42.57 kg/m2  SpO2 98%  Physical Exam: Obese black male currently in no acute distress Lungs:  Clear Cardiac:  Regular rhythm, normal S1 and S2, no S3 Abdomen:  Soft, nontender, no masses Extremities:  Trace edema present  Intake/Output from previous day: 04/22 0701 - 04/23 0700 In: -  Out: 400 [Urine:400]  Weight Filed Weights   01/01/15 0512  Weight: 116.03 kg (255 lb 12.8 oz)    Lab Results: Basic Metabolic Panel:  Recent Labs  12/31/14 2251 01/01/15 0350  NA 141 142  K 4.5 4.4  CL 110 113*  CO2 22 20  GLUCOSE 140* 75  BUN 33* 32*  CREATININE 2.40* 2.19*   CBC:  Recent Labs  01/01/15 0137 01/01/15 0350  WBC 9.0 8.8  HGB 11.5* 11.4*  HCT 35.0* 34.6*  MCV 84.5 84.8  PLT 185 170   Cardiac Enzymes: Troponin (Point of Care Test)  Recent Labs  12/31/14 2258  TROPIPOC 0.01   Telemetry: Sinus rhythm  Assessment/Plan:   1.  Accelerated hypertension due to medical noncompliance 2.  Medical noncompliance 3.  Coronary artery disease with previous stenting and recurrent chest pain likely due to uncontrolled hypertension 4.  Diabetes mellitus with renal complications 5.  Obesity with need to lose weight 6.  Stage IV chronic kidney disease  Recommendations:  Feels better today and blood pressure is under better control but not normal yet.Marland Kitchen  Resume antihypertensive medications and hopefully blood pressure will come under  better control today.  Increase amlodipine.  We'll go ahead and stop intravenous nitroglycerin and await troponins.  Await echocardiogram.  Don't see a role for new testing at this point in time unless recurrent symptoms in light of hypertension and noncompliance.      Kerry Hough  MD [facility]  01/01/2015, 8:46 AM

## 2015-01-02 DIAGNOSIS — R079 Chest pain, unspecified: Secondary | ICD-10-CM

## 2015-01-02 LAB — GLUCOSE, CAPILLARY
GLUCOSE-CAPILLARY: 277 mg/dL — AB (ref 70–99)
Glucose-Capillary: 248 mg/dL — ABNORMAL HIGH (ref 70–99)

## 2015-01-02 MED ORDER — AMLODIPINE BESYLATE 10 MG PO TABS: 10.0000 mg | ORAL_TABLET | Freq: Every day | ORAL | Status: DC

## 2015-01-02 MED ORDER — CARVEDILOL 25 MG PO TABS: ORAL_TABLET | ORAL | Status: DC

## 2015-01-02 MED ORDER — OMEPRAZOLE 20 MG PO CPDR: 20.0000 mg | DELAYED_RELEASE_CAPSULE | Freq: Every day | ORAL | Status: DC | PRN

## 2015-01-02 MED ORDER — ACETAMINOPHEN 325 MG PO TABS: 650.0000 mg | ORAL_TABLET | ORAL | Status: DC | PRN

## 2015-01-02 NOTE — Progress Notes (Signed)
    Subjective:  Denies SOB  Objective:  Vital Signs in the last 24 hours: Temp:  [97.7 F (36.5 C)-98.8 F (37.1 C)] 98.5 F (36.9 C) (04/24 0400) Pulse Rate:  [77-85] 80 (04/24 0400) Resp:  [18-22] 22 (04/24 0400) BP: (116-160)/(68-91) 152/80 mmHg (04/24 0400) SpO2:  [96 %-100 %] 96 % (04/24 0400) Weight:  [253 lb 11.2 oz (115.078 kg)] 253 lb 11.2 oz (115.078 kg) (04/24 0400)  Intake/Output from previous day:  Intake/Output Summary (Last 24 hours) at 01/02/15 0921 Last data filed at 01/01/15 2154  Gross per 24 hour  Intake   1080 ml  Output   1500 ml  Net   -420 ml    Physical Exam: General appearance: alert, cooperative, no distress and morbidly obese Neck: no carotid bruit, no JVD and Thick neck Lungs: clear to auscultation bilaterally Heart: regular rate and rhythm Abdomen: truncal obesity Extremities: trace edema   Rate: 78  Rhythm: normal sinus rhythm  Lab Results:  Recent Labs  01/01/15 0137 01/01/15 0350  WBC 9.0 8.8  HGB 11.5* 11.4*  PLT 185 170    Recent Labs  12/31/14 2251 01/01/15 0350  NA 141 142  K 4.5 4.4  CL 110 113*  CO2 22 20  GLUCOSE 140* 75  BUN 33* 32*  CREATININE 2.40* 2.19*    Recent Labs  01/01/15 1135 01/01/15 1840  TROPONINI <0.03 <0.03    Recent Labs  01/01/15 0350  INR 0.95    Scheduled Meds: . amLODipine  10 mg Oral Daily  . aspirin EC  81 mg Oral Daily  . carvedilol  50 mg Oral BID WC  . clopidogrel  75 mg Oral Q breakfast  . fenofibrate  160 mg Oral Daily  . gabapentin  300 mg Oral TID  . heparin  5,000 Units Subcutaneous 3 times per day  . insulin aspart  0-9 Units Subcutaneous TID WC  . insulin glargine  28 Units Subcutaneous Daily  . losartan  100 mg Oral Daily  . multivitamin with minerals  1 tablet Oral Daily  . pantoprazole  40 mg Oral BID WC  . pravastatin  40 mg Oral QPM  . ranolazine  1,000 mg Oral BID  . zolpidem  10 mg Oral QHS   Continuous Infusions:  PRN Meds:.acetaminophen,  nitroGLYCERIN, ondansetron (ZOFRAN) IV   Imaging: Imaging results have been reviewed  Cardiac Studies: Echo done this AM  Assessment/Plan:  54 y/o male with an extensive h/o CAD, HTN, IDDM,and CRI 3. Marland Kitchen He is s/p multiple stents to his LAD. He is s/p CABG x 1 in 2006 (RIMA to RCA), and distal RCA stenting, most recently in March 2015. He has a history of HTN and poor medical compliance secondary to financial problems. He was admitted 12/31/14 with HTN. He had run out of his medications.   Active Problems:   Unstable angina   CAD- CABG 2006, RCA DES March 2015   HTN (hypertension)   CKD (chronic kidney disease), stage III   Medically noncompliant   Type 2 diabetes mellitus with renal manifestations   Hypertensive heart disease   Dyslipidemia   OSA on CPAP   Obesity-BMI 42   PLAN: Discharge, follow up with an APP in 7-10 days as a TOC pt to review his echo anf f/u B/P. Marland Kitchen   Kerin Ransom PA-C [facility] [id number] 01/02/2015, 9:21 AM

## 2015-01-02 NOTE — Discharge Summary (Signed)
Patient ID: Matthew Rowe.,  MRN: [id number], DOB/AGE: [birth year] 54 y.o.  Admit date: 12/31/2014 Discharge date: 01/02/2015  Primary Care Provider: Lamar Blinks, MD Primary Cardiologist: Dr Aundra Dubin  Discharge Diagnoses Active Problems:   Unstable angina   CAD- CABG 2006, RCA DES March 2015   HTN (hypertension)   CKD (chronic kidney disease), stage III   Medically noncompliant   Type 2 diabetes mellitus with renal manifestations   Hypertensive heart disease   Dyslipidemia   Obesity-BMI 42    Hospital Course: 54 y/o male with an extensive h/o CAD, HTN, IDDM,and CRI 3. Marland Kitchen He is s/p multiple stents to his LAD. He is s/p CABG x 1 in 2006 (RIMA to RCA), and distal RCA stenting, most recently in March 2015. He has a history of HTN and poor medical compliance secondary to financial problems. He was last admitted in Jan 2016 with chest pain in the setting of hypertensive urgency due to non-compliance. The pt presented 12/31/14 to the ED with progressively worsening chest pain. Pt states he ran out of his medications due to financial issues. His BP was around 190s in ER. He was admitted, started on IV NTG, and his medications were resumed. His Troponin was negative x 3. An echo was done on the day of discharge and can be followed up as an OP.   Discharge Vitals:  Blood pressure 152/80, pulse 80, temperature 98.5 F (36.9 C), temperature source Oral, resp. rate 22, height 5\' 5"  (1.651 m), weight 253 lb 11.2 oz (115.078 kg), SpO2 96 %.    Labs: Results for orders placed or performed during the hospital encounter of 12/31/14 (from the past 24 hour(s))  Glucose, capillary     Status: None   Collection Time: 01/01/15  8:03 AM  Result Value Ref Range   Glucose-Capillary 97 70 - 99 mg/dL   Comment 1 Notify RN   Troponin I (q 6hr x 3)     Status: None   Collection Time: 01/01/15  8:20 AM  Result Value Ref Range   Troponin I <0.03 <0.031 ng/mL  Troponin I (q 6hr x 3)     Status: None    Collection Time: 01/01/15 11:35 AM  Result Value Ref Range   Troponin I <0.03 <0.031 ng/mL  Glucose, capillary     Status: Abnormal   Collection Time: 01/01/15 11:41 AM  Result Value Ref Range   Glucose-Capillary 267 (H) 70 - 99 mg/dL   Comment 1 Notify RN   Glucose, capillary     Status: Abnormal   Collection Time: 01/01/15  5:06 PM  Result Value Ref Range   Glucose-Capillary 269 (H) 70 - 99 mg/dL   Comment 1 Notify RN   Troponin I (q 6hr x 3)     Status: None   Collection Time: 01/01/15  6:40 PM  Result Value Ref Range   Troponin I <0.03 <0.031 ng/mL  Glucose, capillary     Status: Abnormal   Collection Time: 01/02/15 12:47 AM  Result Value Ref Range   Glucose-Capillary 248 (H) 70 - 99 mg/dL   Comment 1 Notify RN     Disposition:      Follow-up Information    Follow up with [name], MD.   Specialty:  Cardiology   Why:  office will contact you   Contact information:   [id number] [place] [address] [phone]       Discharge Medications:    Medication List  STOP taking these medications        benzonatate 100 MG capsule  Commonly known as:  TESSALON     isosorbide mononitrate 120 MG 24 hr tablet  Commonly known as:  IMDUR     isosorbide mononitrate 60 MG 24 hr tablet  Commonly known as:  IMDUR      TAKE these medications        ACCU-CHEK SOFTCLIX LANCETS lancets  Use as instructed to check blood sugar 4 times per day dx code E.11.9     acetaminophen 325 MG tablet  Commonly known as:  TYLENOL  Take 2 tablets (650 mg total) by mouth every 4 (four) hours as needed for headache or mild pain.     amLODipine 10 MG tablet  Commonly known as:  NORVASC  Take 1 tablet (10 mg total) by mouth daily.     aspirin 81 MG tablet  Take 1 tablet (81 mg total) by mouth daily.     B-D UF III MINI PEN NEEDLES 31G X 5 MM Misc  Generic drug:  Insulin Pen Needle  USE ONE DAILY FOR INSULIN INJECTION     carvedilol 25 MG tablet    Commonly known as:  COREG  2 tablets (50mg ) two times a day     clopidogrel 75 MG tablet  Commonly known as:  PLAVIX  Take 1 tablet (75 mg total) by mouth daily with breakfast.     colchicine 0.6 MG tablet  Take 2 pills today, then may take 1 pill a day for the next 2-3 days if needed for gout     fenofibrate 145 MG tablet  Commonly known as:  TRICOR  Take 1 tablet (145 mg total) by mouth daily.     Fish Oil Oil  Take 1 tablet by mouth daily.     gabapentin 300 MG capsule  Commonly known as:  NEURONTIN  Take 1 capsule (300 mg total) by mouth 3 (three) times daily.     glucose blood test strip  Commonly known as:  ACCU-CHEK AVIVA PLUS  Use as instructed to check blood sugar 4 times per day dx code E11.9     Insulin Glargine 100 UNIT/ML Solostar Pen  Commonly known as:  LANTUS SOLOSTAR  Take 28 units daily for diabetes     INSULIN SYRINGE .5CC/31GX5/16" 31G X 5/16" 0.5 ML Misc  Use one needle three times per day     losartan 100 MG tablet  Commonly known as:  COZAAR  TAKE 1 TABLET EVERY DAY     multivitamin with minerals Tabs tablet  Take 1 tablet by mouth daily.     nitroGLYCERIN 0.4 MG SL tablet  Commonly known as:  NITROSTAT  Place 1 tablet (0.4 mg total) under the tongue every 5 (five) minutes as needed for chest pain.     pravastatin 40 MG tablet  Commonly known as:  PRAVACHOL  TAKE 1 TABLET EVERY EVENING     ranolazine 1000 MG SR tablet  Commonly known as:  RANEXA  Take 1 tablet (1,000 mg total) by mouth 2 (two) times daily.     zolpidem 10 MG tablet  Commonly known as:  AMBIEN  TAKE 1 TABLET AT BEDTIME      ASK your doctor about these medications        insulin NPH-regular Human (70-30) 100 UNIT/ML injection  Commonly known as:  NOVOLIN 70/30 RELION  Inject 15 Units into the skin 3 (three) times daily.  pantoprazole 40 MG tablet  Commonly known as:  PROTONIX  Take 1 tablet (40 mg total) by mouth 2 (two) times daily with a meal.          Duration of Discharge Encounter: Greater than 30 minutes including physician time.  Angelena Form PA-C 01/02/2015 7:34 AM   Discharge instructions above reviewed.  Importance of compliance with medication discussed with patient as well as avoidance of cocaine.  He will be given prescriptions to take to the drugstore until he gets his medicines for mail order.  He will need follow-up within the week.  Kerry Hough MD [facility]

## 2015-01-02 NOTE — Progress Notes (Addendum)
01/02/15 09:45 CARE MANAGEMENT NOTE 01/02/2015  Patient:  Matthew Rowe, Matthew Rowe   Account Number:  [id number]  Date Initiated:  01/02/2015  Documentation initiated by:  [facility]  Subjective/Objective Assessment:   adm: Chest pain, hypertensive urgency     Action/Plan:   discharge planning   Anticipated DC Date:  01/02/2015   Anticipated DC Plan:  [place]  Medication Assistance  CM consult      Choice offered to / List presented to:             Status of service:  Completed, signed off Medicare Important Message given?   (If response is "NO", the following Medicare IM given date fields will be blank) Date Medicare IM given:   Medicare IM given by:   Date Additional Medicare IM given:   Additional Medicare IM given by:    Discharge Disposition:  HOME/SELF CARE  Per UR Regulation:    If discussed at Long Length of Stay Meetings, dates discussed:    Comments:  01/02/15 MD has requested med asst.  Unfortunately, pt has insurance and does not meet criteria for MATCH.  No other CM needs were communicated.  Mariane Masters, BSN, CM [phone].

## 2015-01-02 NOTE — Discharge Instructions (Signed)

## 2015-01-02 NOTE — Progress Notes (Signed)
  Echocardiogram 2D Echocardiogram has been performed.  Matthew Rowe 01/02/2015, 8:51 AM

## 2015-01-04 ENCOUNTER — Other Ambulatory Visit: Payer: Self-pay | Admitting: Family Medicine

## 2015-01-04 NOTE — Progress Notes (Signed)
UR Completed Danie Diehl Graves-Bigelow, RN,BSN [phone]  

## 2015-01-05 NOTE — Telephone Encounter (Signed)
Dr Lorelei Pont, cardiologist just sent in a yr of RFs to local pharm for both BP meds, but now it looks like pt is trying to get them filled at mail order. Do you want to RF these or have me ask mail order to send to cardiologist? Please address [email or web address] also.

## 2015-01-12 ENCOUNTER — Other Ambulatory Visit: Payer: Self-pay

## 2015-01-12 MED ORDER — CARVEDILOL 25 MG PO TABS: ORAL_TABLET | ORAL | Status: DC

## 2015-01-12 MED ORDER — AMLODIPINE BESYLATE 10 MG PO TABS: 10.0000 mg | ORAL_TABLET | Freq: Every day | ORAL | Status: DC

## 2015-01-12 NOTE — Telephone Encounter (Signed)
He also states the medication have gotten denied several times. Patient wouldn't state which medications were requested by the pharmacy.

## 2015-01-12 NOTE — Telephone Encounter (Signed)
Patient is calling because he was recently in the hospital and his medication has not been sent to the pharmacy. Patient states that this is an urgent call. Patient wants to talk with some. He states that this is ridiculous. Pharmacy is [facility]. Patient phone: [phone]

## 2015-01-12 NOTE — Telephone Encounter (Signed)
This is a recurring problem with [facility] mail order. We received requests electronically on 01/04/15 for amlodipine, carvedilol and ambien. Dr Lorelei Pont OKd these and EPIC shows receipt of both BP meds on 4/27 at 5:33 pm. [facility] routinely resends multiple reqs by fax for medications for 2-3 weeks after we have sent in the Rxs. Whenever I receive the duplicates I fax back the duplicate reqs WITH A NOTE stating that Rx/RF was already sent in on whatever the date was. I explained all of this to pt. He stated that [facility] said they never got the Rxs and that we keep denying the RFs. Dr Lorelei Pont, I have resent the BP meds again electronically and pended the Ambien Rx again for your approval to re-fax to [facility]. I advised pt that we will resend and that if [facility] tells him again that they did not get the Rxs to tell them that we show receipt by them on 4/27 and we resent them again today.

## 2015-01-12 NOTE — Telephone Encounter (Signed)
Patient is calling because he was recently in the hospital and his medication has not been sent to the pharmacy. Patient states that this is an urgent call. Patient wants to talk with some. He states that this is ridiculous. Pharmacy Pharmacy. [phone]

## 2015-01-12 NOTE — Telephone Encounter (Signed)
Dr Lorelei Pont signed and re-sent these Rxs.

## 2015-01-18 ENCOUNTER — Other Ambulatory Visit: Payer: Self-pay | Admitting: Endocrinology

## 2015-01-19 ENCOUNTER — Encounter: Payer: Commercial Managed Care - HMO | Admitting: Physician Assistant

## 2015-01-19 NOTE — Progress Notes (Signed)
Cardiology Office Note   Date:  01/19/2015   ID:  Matthew Kirks Sr., DOB 1961/08/07, MRN [id number]  PCP:  Lamar Blinks, MD  Cardiologist:  Dr. [name]  >> Dr. [name]    Chief Complaint  Patient presents with  . Coronary Artery Disease  . Hospitalization Follow-up    Hypertension     History of Present Illness: Jamaree Scalia. is a 54 y.o. male with a hx of CAD, status post CABG in 2006 and [place] ([place]), status post prior extensive stenting of the LAD and subsequent occlusion of the distal vessel, diabetes, HTN, HL, CKD. Patient has a history of chronic daily angina. He has been treated with a combination of amlodipine, Ranexa, isosorbide and Neurontin.  In 1/14, he developed progressive angina and was hospitalized. LHC was done, showing unchanged anatomy. RIMA-RCA was patent. He did well after this on ranolazine. In 4/14, he ran out of ranolazine and the chest pain returned. He was admitted overnight in 4/14 and ruled out for MI. In 7/14, he had again run low on ranolazine and developed more chest pain. He was admitted again and ruled out for MI. In 3/15, he was admitted with recurrent chest pain. LHC was done, showing 90% distal RCA stenosis after RIMA insertion. He had Promus DES x 2 to distal RCA. He ran out of amlodipine and losartan in 1/16 and developed chest pain in the setting of elevated BP. He was admitted and ruled out for MI. Chest pain resolved with BP control. Last seen in this office by Dr. Aundra Dubin 11/03/14.  Admitted 4/22-4/24 With progressively worsening chest pain in the setting of hypertensive urgency. He had been nonadherent his medications due to financial constraints. He was admitted and placed on IV nitroglycerin and medications were resumed. Cardiac enzymes remained negative. Echocardiogram demonstrated normal LV function, mild diastolic dysfunction, mild aortic stenosis with mean gradient 14 mmHg. He returns for  follow-up.  Studies/Reports Reviewed Today:  LHC/PCI 11/2013  Left Main:  Normal in size with 30 % distal stenosis. Left Anterior Descending (LAD):  Multiple stents are noted throughout the vessel. Ostial 50%, diffuse proximal 20% ISR, mid and distal diffuse 50% ISR, distal LAD is occluded Circumflex (LCx):  Small in size. There is 40-50% ostial stenosis. Ostial OM2 50% Ramus Intermedius:  This bifurcates into 2 branches. Superior Br 70-80% ostial stenosis.  Right Coronary Artery: Diffuse proximal 20%, distal 90% after the anastomosis with RIMA . Distal 60% extending into the right PDA.  The PDA proximal 60%, posterior AV segment 30% ostial RIMA-RCA patent PCI: mid to distal RCA with overlapping Promus DES, 2.25 x 24 and 2.25 x 16 LVEDP 26 mmHg.   Echo 01/02/15 Study Conclusions - Mild LVH. EF 50% to 55%.Wall motion was normal; Grade 1 diastolic dysfunction. - Aortic valve: There was very mild stenosis. There was mildregurgitation. Valve area (VTI): 1.7 cm^2. Valve area (Vmax):1.61 cm^2. Valve area (Vmean): 1.54 cm^2.  Mean gradient (S): 9mm Hg. Peak gradient (S): 26 mm Hg. - Mitral valve: Calcified annulus. Mildly thickened leaflets.  There was mild regurgitation.  Nuclear 11/28/13 IMPRESSION: Non reversible decreased myocardial perfusion at the left ventricular apex. Large perfusion defect involving the inferolateral wall the left ventricle with partial reversibility particularly at the lateral margin of the defect on resting images. When compared to the previous exam the inferior wall defect extends further into the posterior wall and lateral wall than on the previous study. Normal left ventricular ejection fraction of 54%, slightly  increased from the 46% on the previous exam.   Past Medical History: 1. CAD: CABG 2006 at [facility] with [place]. 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 Patient has had prior extensive PCI to LAD with occlusion of the distal  LAD. LHC (7/12): proximal LAD stent, extensive mid-distal LAD stenting, 60-70% mid LAD instent restenosis, subtotal distal LAD instent restenosis, LCFx without significant disease, anomalous RCA off left cusp with mild disease, patent RIMA-RCA. Echo (3/12): EF 55-60%, trivial AI, mild LVH. Lexiscan myoview (9/13) with small apical scar, moderate inferior scar from base to apex with some reversibility, EF 47% with apical hypokinesis (similar to prior study). Chronic angina. LHC (1/14): total occlusion of distal LAD, diffuse disease in ramus, 70% proximal LCx, 50% proximal and mid RCA, 50% distal RCA, patent RIMA-RCA. LHC (3/15) with multiple stents in the LAD and 50% ostial LAD stenosis, total occlusion distal LAD, 40-50% ostial LCx stenosis, 90% distal RCA stenosis after RIMA insertion. Patient had DES x 2 to distal RCA.  2. OSA: CPAP. 3. Type II diabetes 4. Hyperlipidemia: had side effects from Lipitor. Very high TGs.  5. HTN 6. CKD: polycystic kidney disease.  7. Ischemic cardiomyopathy: EF 55-60% by echo in 3/12, EF 47% with apical hypokinesis in 9/13. Past Medical History  Diagnosis Date  . Dyslipidemia   . Coronary artery disease     a. INF MI 1999: PCI of RCA;  b.  extensive stenting of LAD;  c. s/p CABG with RIMA->RCA in 2006;  d. Stable, Low risk MV 05/2012; e. 09/2012 Cath: stable anatomy->med Rx. f. Abnl nuc 11/2013 -> med rx; g. 11/2013 Cath/PCI: LM 30d, LAD 50ost, 20/50isr, 100d, LCX 40-50ost, OM2 50ost, RI 70-80ost sup branch, RCA 20p, 90/60d(2.25x17 & 2.25x24 Promus DES'), PDA 60p, RIMA->RCA ok.  . Hypertensive heart disease   . Hx of echocardiogram     a. Echo 3/12: mild LVH, EF 55-60%, trivial AI  . Anginal pain   . Myocardial infarction 1999; 2002; 2003; 2006; ~ 2008  . OSA on CPAP   . DM2 (diabetes mellitus, type 2)   . GERD (gastroesophageal reflux disease)   . History of gout   . CKD (chronic kidney disease), stage III   . Polycystic kidney disease     Past  Surgical History  Procedure Laterality Date  . Cystectomy  1990's    off face  . Left heart catheterization with coronary/graft angiogram N/A 09/16/2012    Procedure: LEFT HEART CATHETERIZATION WITH [name];  Surgeon: [name], MD;  Location: [facility] CATH LAB;  Service: Cardiovascular;  Laterality: N/A;  . Left heart catheterization with coronary/graft angiogram N/A 12/02/2013    Procedure: LEFT HEART CATHETERIZATION WITH [name];  Surgeon: [name], MD;  Location: [facility] CATH LAB;  Service: Cardiovascular;  Laterality: N/A;  . Percutaneous coronary stent intervention (pci-s) N/A 12/03/2013    Procedure: PERCUTANEOUS CORONARY STENT INTERVENTION (PCI-S);  Surgeon: [name], MD;  Location: [facility] CATH LAB;  Service: Cardiovascular;  Laterality: N/A;  . Coronary artery bypass graft  2006    CABG X?; in [place]  . Coronary angioplasty with stent placement  1999 / 2002 / 2004 / 2006?  Marland Kitchen Cardiac catheterization  2012  . Cardiac catheterization  2013  . Cardiac catheterization  2014    LHC (1/14): total occlusion of distal LAD, diffuse disease in ramus, 70% proximal LCx, 50% proximal and mid RCA, 50% distal RCA, patent RIMA-RCA.     Current Outpatient  Prescriptions  Medication Sig Dispense Refill  . ACCU-CHEK SOFTCLIX LANCETS lancets Use as instructed to check blood sugar 4 times per day dx code E.11.9 200 each 3  . acetaminophen (TYLENOL) 325 MG tablet Take 2 tablets (650 mg total) by mouth every 4 (four) hours as needed for headache or mild pain.    Marland Kitchen amLODipine (NORVASC) 10 MG tablet Take 1 tablet (10 mg total) by mouth daily. 90 tablet 3  . aspirin 81 MG tablet Take 1 tablet (81 mg total) by mouth daily.    . B-D UF III MINI PEN NEEDLES 31G X 5 MM MISC USE ONE DAILY FOR INSULIN INJECTION 90 each 1  . carvedilol (COREG) 25 MG tablet TAKE 1 AND 1/2 TABLETS TWICE DAILY WITH MEALS 270 tablet 3  . clopidogrel (PLAVIX) 75 MG tablet Take 1 tablet (75 mg  total) by mouth daily with breakfast. 90 tablet 3  . colchicine 0.6 MG tablet Take 2 pills today, then may take 1 pill a day for the next 2-3 days if needed for gout 30 tablet 0  . fenofibrate (TRICOR) 145 MG tablet Take 1 tablet (145 mg total) by mouth daily. 90 tablet 3  . Fish Oil OIL Take 1 tablet by mouth daily.     Marland Kitchen gabapentin (NEURONTIN) 300 MG capsule Take 1 capsule (300 mg total) by mouth 3 (three) times daily. 270 capsule 3  . glucose blood (ACCU-CHEK AVIVA PLUS) test strip Use as instructed to check blood sugar 4 times per day dx code E11.9 125 each 3  . Insulin Glargine (LANTUS SOLOSTAR) 100 UNIT/ML Solostar Pen Take 28 units daily for diabetes (Patient taking differently: Inject 28 Units into the skin daily at 10 pm. Take 28 units daily for diabetes) 5 pen 6  . insulin NPH-regular Human (NOVOLIN 70/30 RELION) (70-30) 100 UNIT/ML injection Inject 15 Units into the skin 3 (three) times daily. 20 mL 2  . Insulin Syringe-Needle U-100 (INSULIN SYRINGE .5CC/31GX5/16") 31G X 5/16" 0.5 ML MISC Use one needle three times per day 100 each 3  . losartan (COZAAR) 100 MG tablet TAKE 1 TABLET EVERY DAY 90 tablet 0  . Multiple Vitamin (MULTIVITAMIN WITH MINERALS) TABS tablet Take 1 tablet by mouth daily.    . nitroGLYCERIN (NITROSTAT) 0.4 MG SL tablet Place 1 tablet (0.4 mg total) under the tongue every 5 (five) minutes as needed for chest pain. 25 tablet 11  . omeprazole (PRILOSEC) 20 MG capsule Take 1 capsule (20 mg total) by mouth daily as needed (indigestion).    . pravastatin (PRAVACHOL) 40 MG tablet TAKE 1 TABLET EVERY EVENING 90 tablet 1  . ranolazine (RANEXA) 1000 MG SR tablet Take 1 tablet (1,000 mg total) by mouth 2 (two) times daily. 30 tablet 6  . zolpidem (AMBIEN) 10 MG tablet TAKE 1 TABLET AT BEDTIME 90 tablet 0   No current facility-administered medications for this visit.    Allergies:   Ciprocin-fluocin-procin; Clarithromycin; Cleocin; and Glimepiride    Social History:  The  patient  reports that he has quit smoking. His smoking use included Cigarettes. He has never used smokeless tobacco. He reports that he does not drink alcohol or use illicit drugs.   Family History:  The patient's family history includes Anemia in his mother; Diabetes in his brother, father, sister, and sister; Heart attack in his sister; Heart attack (age of onset: 16) in his father; Heart failure in his father; Hyperlipidemia in his father; Hypertension in his father and sister; Kidney failure (  age of onset: 12) in his sister; Lung cancer in his mother; Polycystic kidney disease in his brother and mother.    ROS:   Please see the history of present illness.   ROS    PHYSICAL EXAM: VS:  There were no vitals taken for this visit.    Wt Readings from Last 3 Encounters:  01/02/15 253 lb 14.4 oz (115.168 kg)  11/24/14 255 lb (115.667 kg)  11/03/14 248 lb (112.492 kg)     GEN: Well nourished, well developed, in no acute distress HEENT: normal Neck: no JVD, no carotid bruits, no masses Cardiac:  Normal S1/S2, RRR; no murmur ,  no rubs or gallops, no edema  Respiratory:  clear to auscultation bilaterally, no wheezing, rhonchi or rales. GI: soft, nontender, nondistended, + BS MS: no deformity or atrophy Skin: warm and dry  Neuro:  CNs II-XII intact, Strength and sensation are intact Psych: Normal affect   EKG:  EKG is ordered today.  It demonstrates:      Recent Labs: 11/24/2014: ALT 11 12/31/2014: B Natriuretic Peptide 127.0* 01/01/2015: BUN 32*; Creatinine 2.19*; Hemoglobin 11.4*; Platelets 170; Potassium 4.4; Sodium 142  11/24/2014: Hemoglobin-A1c 10.2    Lipid Panel    Component Value Date/Time   CHOL 217* 01/01/2015 0350   TRIG 526* 01/01/2015 0350   HDL 27* 01/01/2015 0350   CHOLHDL 8.0 01/01/2015 0350   VLDL UNABLE TO CALCULATE IF TRIGLYCERIDE OVER 400 mg/dL 01/01/2015 0350   LDLCALC UNABLE TO CALCULATE IF TRIGLYCERIDE OVER 400 mg/dL 01/01/2015 0350   LDLDIRECT 72.0  11/03/2014 0923      ASSESSMENT AND PLAN:  Coronary artery disease involving native coronary artery of native heart without angina pectoris  Essential hypertension  Hyperlipidemia  CKD (chronic kidney disease), unspecified stage  OSA on CPAP  Diabetes type 2, uncontrolled    Current medicines are reviewed at length with the patient today.  Concerns regarding medicines are as outlined above.  The following changes have been made:       Labs/ tests ordered today include:  No orders of the defined types were placed in this encounter.    Disposition:   FU with    Signed, Richardson Dopp, PA-C, MHS 01/19/2015 8:29 AM    [facility] [address], [place], [place]  [address] Phone: [phone]; Fax: [phone]    This encounter was created in error - please disregard.

## 2015-02-02 ENCOUNTER — Other Ambulatory Visit: Payer: Self-pay | Admitting: Family Medicine

## 2015-02-03 ENCOUNTER — Ambulatory Visit (INDEPENDENT_AMBULATORY_CARE_PROVIDER_SITE_OTHER): Payer: Commercial Managed Care - HMO | Admitting: Cardiology

## 2015-02-03 ENCOUNTER — Encounter: Payer: Self-pay | Admitting: Cardiology

## 2015-02-03 VITALS — BP 126/70 | HR 76 | Ht 65.0 in | Wt 251.0 lb

## 2015-02-03 DIAGNOSIS — N183 Chronic kidney disease, stage 3 unspecified: Secondary | ICD-10-CM

## 2015-02-03 DIAGNOSIS — I25810 Atherosclerosis of coronary artery bypass graft(s) without angina pectoris: Secondary | ICD-10-CM | POA: Diagnosis not present

## 2015-02-03 DIAGNOSIS — R079 Chest pain, unspecified: Secondary | ICD-10-CM

## 2015-02-03 DIAGNOSIS — I25118 Atherosclerotic heart disease of native coronary artery with other forms of angina pectoris: Secondary | ICD-10-CM | POA: Diagnosis not present

## 2015-02-03 NOTE — Patient Instructions (Addendum)
Medication Instructions:  Increase Fish Oil to 1200mg  two times daily  Labwork: None today  Testing/Procedures: None today.  Follow-Up: Your physician recommends that you schedule a follow-up appointment in: 4 months with Dr Aundra Dubin.

## 2015-02-03 NOTE — Progress Notes (Signed)
Patient ID: Matthew Mccathern., male   DOB: 1961-03-15, 54 y.o.   MRN: [id number] PCP: Dr. Lorelei Pont  54 yo with history CAD s/p CABG (RIMA to RCA, anomalous RCA off left cusp), occlusion of the distal LAD, and chronic angina presents for cardiology followup.  Patient has had chronic angina for years. In 1/14, he developed progressive angina and was hospitalized.  LHC was done, showing unchanged anatomy.  RIMA-RCA was patent.  He did well after this on ranolazine.  In 4/14, he ran out of ranolazine and the chest pain returned. He was admitted overnight in 4/14 and ruled out for MI.  In 7/14, he had again run low on ranolazine and developed more chest pain.  He was admitted again and ruled out for MI.  In 3/15, he was admitted with recurrent chest pain.  LHC was done, showing 90% distal RCA stenosis after RIMA insertion.  He had Promus DES x 2 to distal RCA.  He ran out of amlodipine and losartan in 1/16 and developed chest pain in the setting of elevated BP. He was admitted and ruled out for MI.  Chest pain resolved with BP control. He was admitted again in 4/16 with a hypertensive urgency after running out of his meds.  Last echo in 4/16 showed EF 50-55% and mild AS.   He is now on all his medications.  He is at baseline.  He has chest pain if he walks fast or runs or if he walks fast up steps. Chest pain tends to occur a couple of times a week. This is his chronic stable pattern.  This is much better than in the past.  He is rarely using NTG.  No significant exertional dyspnea walking on flat ground.  No palpitations, orthopnea, PND.  He is not using CPAP, machine is broken.   ECG: NSR, old inferior infarct, old anterolateral infarct  Labs (7/13): K 4.9, creatinine 1.75, LDL 63 Labs (1/14): K 4.2, creatinine 1.65, TGs 483 (could not calculate LDL) Labs (4/14): K 4.8, creatinine 1.62, HCT 36.8 Labs (7/14): K 4.3, creatinine 1.63, LDL 56, HDL 34, TGs 382 Labs (1/15): K 4.6, creatinine 2 Labs (3/15): TGs  605, LDL unable to calculate Labs (5/15): K 4.7, creatinine 2.2 Labs (7/15): K 4.4, creatinine 2.2, LDL 51, HDL 32, TGs 983 Labs (1/16): TGs 524, unable to calculate LDL Labs (2/16): K 4.7, creatinine 1.98 Labs (4/16): K 4.4, creatinine 2.19, HCT 34.6, TGs 526, unable to calculate LDL  PMH: 1. CAD: CABG 2006 at [facility] with [place].  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  Patient has had prior extensive PCI to LAD with occlusion of the distal LAD.  LHC (7/12): proximal LAD stent, extensive mid-distal LAD stenting, 60-70% mid LAD instent restenosis, subtotal distal LAD instent restenosis, LCFx without significant disease, anomalous RCA off left cusp with mild disease, patent RIMA-RCA.  Echo (3/12): EF 55-60%, trivial AI, mild LVH.  Lexiscan myoview (9/13) with small apical scar, moderate inferior scar from base to apex with some reversibility, EF 47% with apical hypokinesis (similar to prior study).  Chronic angina.  LHC (1/14): total occlusion of distal LAD, diffuse disease in ramus, 70% proximal LCx, 50% proximal and mid RCA, 50% distal RCA, patent RIMA-RCA.   LHC (3/15) with multiple stents in the LAD and 50% ostial LAD stenosis, total occlusion distal LAD, 40-50% ostial LCx stenosis, 90% distal RCA stenosis after RIMA insertion.  Patient had DES x 2 to distal  RCA.  2. OSA: CPAP. 3. Type II diabetes 4. Hyperlipidemia: had side effects from Lipitor. Very high TGs.  5. HTN 6. CKD: polycystic kidney disease.  7. Ischemic cardiomyopathy: EF 55-60% by echo in 3/12, EF 47% with apical hypokinesis in 9/13.  Echo (4/16) with EF 50-55%, mild LVH, mild AS with mean gradient 14 mmHg.  8. Gout 9. Aortic stenosis: Mild.   SH: Nonsmoker.  Married, lives in [place].  On disability.   FH: Multiple family members with MIs.   ROS: All systems reviewed and negative except as per HPI.   Current Outpatient Prescriptions  Medication Sig Dispense Refill  . ACCU-CHEK SOFTCLIX  LANCETS lancets Use as instructed to check blood sugar 4 times per day dx code E.11.9 200 each 3  . acetaminophen (TYLENOL) 325 MG tablet Take 2 tablets (650 mg total) by mouth every 4 (four) hours as needed for headache or mild pain.    Marland Kitchen amLODipine (NORVASC) 10 MG tablet Take 1 tablet (10 mg total) by mouth daily. 90 tablet 3  . aspirin 81 MG tablet Take 1 tablet (81 mg total) by mouth daily.    . B-D UF III MINI PEN NEEDLES 31G X 5 MM MISC USE ONE DAILY FOR INSULIN INJECTION 90 each 1  . carvedilol (COREG) 25 MG tablet TAKE 1 AND 1/2 TABLETS TWICE DAILY WITH MEALS 270 tablet 3  . clopidogrel (PLAVIX) 75 MG tablet Take 1 tablet (75 mg total) by mouth daily with breakfast. 90 tablet 3  . colchicine 0.6 MG tablet Take 2 pills today, then may take 1 pill a day for the next 2-3 days if needed for gout 30 tablet 0  . fenofibrate (TRICOR) 145 MG tablet Take 1 tablet (145 mg total) by mouth daily. 90 tablet 3  . Fish Oil OIL Take 1 tablet by mouth daily.     Marland Kitchen gabapentin (NEURONTIN) 300 MG capsule Take 1 capsule (300 mg total) by mouth 3 (three) times daily. 270 capsule 3  . glucose blood (ACCU-CHEK AVIVA PLUS) test strip Use as instructed to check blood sugar 4 times per day dx code E11.9 125 each 3  . Insulin Glargine (LANTUS SOLOSTAR) 100 UNIT/ML Solostar Pen Take 28 units daily for diabetes (Patient taking differently: Inject 28 Units into the skin daily at 10 pm. Take 28 units daily for diabetes) 5 pen 6  . insulin NPH-regular Human (NOVOLIN 70/30 RELION) (70-30) 100 UNIT/ML injection Inject 15 Units into the skin 3 (three) times daily. 20 mL 2  . Insulin Syringe-Needle U-100 (INSULIN SYRINGE .5CC/31GX5/16") 31G X 5/16" 0.5 ML MISC Use one needle three times per day 100 each 3  . losartan (COZAAR) 100 MG tablet TAKE 1 TABLET EVERY DAY 90 tablet 0  . Multiple Vitamin (MULTIVITAMIN WITH MINERALS) TABS tablet Take 1 tablet by mouth daily.    . nitroGLYCERIN (NITROSTAT) 0.4 MG SL tablet Place 1 tablet  (0.4 mg total) under the tongue every 5 (five) minutes as needed for chest pain. 25 tablet 11  . omeprazole (PRILOSEC) 20 MG capsule Take 1 capsule (20 mg total) by mouth daily as needed (indigestion).    . pravastatin (PRAVACHOL) 40 MG tablet TAKE 1 TABLET EVERY EVENING 90 tablet 1  . ranolazine (RANEXA) 1000 MG SR tablet Take 1 tablet (1,000 mg total) by mouth 2 (two) times daily. 30 tablet 6  . zolpidem (AMBIEN) 10 MG tablet TAKE 1 TABLET AT BEDTIME 90 tablet 0  . isosorbide mononitrate (IMDUR) 60 MG 24 hr  tablet Take 1 tablet (60 mg total) by mouth 2 (two) times daily. 90 tablet 3  . Omega-3 Fatty Acids (FISH OIL) 1200 MG CAPS Take 1 capsule (1,200 mg total) by mouth 2 (two) times daily.     No current facility-administered medications for this visit.    BP 126/70 mmHg  Pulse 76  Ht 5\' 5"  (1.651 m)  Wt 251 lb (113.853 kg)  BMI 41.77 kg/m2 General: NAD, obese.  Neck: Thick, no JVD, no thyromegaly or thyroid nodule.  Lungs: Clear to auscultation bilaterally with normal respiratory effort. CV: Nondisplaced PMI.  Heart regular S1/S2, no S3/S4, 1/6 SEM RUSB.  No peripheral edema.  No carotid bruit.  Normal pedal pulses.  Abdomen: Soft, nontender, no hepatosplenomegaly, no distention.  Neurologic: Alert and oriented x 3.  Psych: Normal affect. Extremities: No clubbing or cyanosis.   Assessment/Plan: 1. CAD:  Chronic stable angina as long as he stays on his medications.  Has more chest pain when he runs out.  Currently stable, on all meds.   - Continue ASA 81 and Plavix long-term. - Continue statin, Imdur, amlodipine, ranolazine, Coreg.  We will find ranolazine samples for him today.  2. HTN: Controlled as long as he stays on his meds. 3. Hyperlipidemia: Triglycerides remain very high.  Continue fenofibrate, will increase his fish oil to 2 tabs daily. He needs to watch his diet.  He will also continue statin.  Repeat lipids in 2 months.  4. CKD: Patient has APKD.  Creatinine stable. He  has a nephrologist.   [name] 02/03/2015

## 2015-02-09 ENCOUNTER — Other Ambulatory Visit: Payer: Self-pay | Admitting: Family Medicine

## 2015-02-09 DIAGNOSIS — E0821 Diabetes mellitus due to underlying condition with diabetic nephropathy: Secondary | ICD-10-CM

## 2015-02-09 MED ORDER — INSULIN GLARGINE 100 UNIT/ML SOLOSTAR PEN: PEN_INJECTOR | SUBCUTANEOUS | Status: DC

## 2015-02-10 ENCOUNTER — Institutional Professional Consult (permissible substitution): Payer: Commercial Managed Care - HMO | Admitting: Neurology

## 2015-02-14 ENCOUNTER — Telehealth: Payer: Self-pay

## 2015-02-14 NOTE — Telephone Encounter (Signed)
Pt would like Dr. Lorelei Pont to know he is not feeling any better. His wife would to know if he could have a script for this. Please advise at [phone]. I can't find where he has been here for this.

## 2015-02-15 ENCOUNTER — Encounter (HOSPITAL_COMMUNITY): Payer: Self-pay | Admitting: Emergency Medicine

## 2015-02-15 ENCOUNTER — Emergency Department (HOSPITAL_COMMUNITY): Payer: Commercial Managed Care - HMO

## 2015-02-15 ENCOUNTER — Emergency Department (HOSPITAL_COMMUNITY)
Admission: EM | Admit: 2015-02-15 | Discharge: 2015-02-16 | Disposition: A | Discharge status: Home / Self Care | Payer: Commercial Managed Care - HMO | Attending: Emergency Medicine | Admitting: Emergency Medicine

## 2015-02-15 DIAGNOSIS — R0602 Shortness of breath: Secondary | ICD-10-CM | POA: Diagnosis not present

## 2015-02-15 DIAGNOSIS — R079 Chest pain, unspecified: Secondary | ICD-10-CM | POA: Diagnosis not present

## 2015-02-15 DIAGNOSIS — I25119 Atherosclerotic heart disease of native coronary artery with unspecified angina pectoris: Secondary | ICD-10-CM | POA: Insufficient documentation

## 2015-02-15 DIAGNOSIS — R059 Cough, unspecified: Secondary | ICD-10-CM

## 2015-02-15 DIAGNOSIS — Z7902 Long term (current) use of antithrombotics/antiplatelets: Secondary | ICD-10-CM | POA: Diagnosis not present

## 2015-02-15 DIAGNOSIS — Z7982 Long term (current) use of aspirin: Secondary | ICD-10-CM | POA: Diagnosis not present

## 2015-02-15 DIAGNOSIS — E119 Type 2 diabetes mellitus without complications: Secondary | ICD-10-CM | POA: Diagnosis not present

## 2015-02-15 DIAGNOSIS — K219 Gastro-esophageal reflux disease without esophagitis: Secondary | ICD-10-CM | POA: Insufficient documentation

## 2015-02-15 DIAGNOSIS — I130 Hypertensive heart and chronic kidney disease with heart failure and stage 1 through stage 4 chronic kidney disease, or unspecified chronic kidney disease: Secondary | ICD-10-CM | POA: Diagnosis not present

## 2015-02-15 DIAGNOSIS — Z87891 Personal history of nicotine dependence: Secondary | ICD-10-CM | POA: Diagnosis not present

## 2015-02-15 DIAGNOSIS — R42 Dizziness and giddiness: Secondary | ICD-10-CM | POA: Diagnosis not present

## 2015-02-15 DIAGNOSIS — Z9981 Dependence on supplemental oxygen: Secondary | ICD-10-CM | POA: Insufficient documentation

## 2015-02-15 DIAGNOSIS — J069 Acute upper respiratory infection, unspecified: Secondary | ICD-10-CM | POA: Insufficient documentation

## 2015-02-15 DIAGNOSIS — N183 Chronic kidney disease, stage 3 (moderate): Secondary | ICD-10-CM | POA: Diagnosis not present

## 2015-02-15 DIAGNOSIS — I252 Old myocardial infarction: Secondary | ICD-10-CM | POA: Diagnosis not present

## 2015-02-15 DIAGNOSIS — R110 Nausea: Secondary | ICD-10-CM | POA: Diagnosis not present

## 2015-02-15 DIAGNOSIS — Z794 Long term (current) use of insulin: Secondary | ICD-10-CM | POA: Diagnosis not present

## 2015-02-15 DIAGNOSIS — I509 Heart failure, unspecified: Secondary | ICD-10-CM | POA: Insufficient documentation

## 2015-02-15 DIAGNOSIS — Z79899 Other long term (current) drug therapy: Secondary | ICD-10-CM | POA: Insufficient documentation

## 2015-02-15 DIAGNOSIS — E785 Hyperlipidemia, unspecified: Secondary | ICD-10-CM | POA: Insufficient documentation

## 2015-02-15 DIAGNOSIS — R05 Cough: Secondary | ICD-10-CM

## 2015-02-15 DIAGNOSIS — G4733 Obstructive sleep apnea (adult) (pediatric): Secondary | ICD-10-CM | POA: Insufficient documentation

## 2015-02-15 LAB — BASIC METABOLIC PANEL
ANION GAP: 9 (ref 5–15)
BUN: 31 mg/dL — ABNORMAL HIGH (ref 6–20)
CO2: 23 mmol/L (ref 22–32)
Calcium: 9.5 mg/dL (ref 8.9–10.3)
Chloride: 104 mmol/L (ref 101–111)
Creatinine, Ser: 2.32 mg/dL — ABNORMAL HIGH (ref 0.61–1.24)
GFR calc Af Amer: 35 mL/min — ABNORMAL LOW (ref >60)
GFR, EST NON AFRICAN AMERICAN: 30 mL/min — AB (ref >60)
GLUCOSE: 315 mg/dL — AB (ref 65–99)
Potassium: 4.6 mmol/L (ref 3.5–5.1)
Sodium: 136 mmol/L (ref 135–145)

## 2015-02-15 LAB — CBC
HCT: 35.8 % — ABNORMAL LOW (ref 39.0–52.0)
HEMOGLOBIN: 12.1 g/dL — AB (ref 13.0–17.0)
MCH: 28.2 pg (ref 26.0–34.0)
MCHC: 33.8 g/dL (ref 30.0–36.0)
MCV: 83.4 fL (ref 78.0–100.0)
Platelets: 213 10*3/uL (ref 150–400)
RBC: 4.29 MIL/uL (ref 4.22–5.81)
RDW: 13.4 % (ref 11.5–15.5)
WBC: 7.4 10*3/uL (ref 4.0–10.5)

## 2015-02-15 LAB — BRAIN NATRIURETIC PEPTIDE: B Natriuretic Peptide: 66.9 pg/mL (ref 0.0–100.0)

## 2015-02-15 LAB — I-STAT TROPONIN, ED: Troponin i, poc: 0.01 ng/mL (ref 0.00–0.08)

## 2015-02-15 MED ORDER — HYDROCOD POLST-CPM POLST ER 10-8 MG/5ML PO SUER: 5.0000 mL | Freq: Two times a day (BID) | ORAL | Status: DC | PRN

## 2015-02-15 NOTE — ED Provider Notes (Signed)
CSN: [id number]     Arrival date & time 02/15/15  1933 History   This chart was scribed for Debby Freiberg, MD by Chester Holstein, ED Scribe. This patient was seen in room A01C/A01C and the patient's care was started at 11:12 PM.    Chief Complaint  Patient presents with  . Chest Pain     The history is provided by the patient. No language interpreter was used.   HPI Comments: Cosmos Rauschenbach. is a 54 y.o. male with PMHx of CAD, DM, CKD, OSA, MI, and dyslipidemia who presents to the Emergency Department complaining of intermittent productive cough with yellow sputum with onset 4 days ago. Pt was prescribed Abx by PCP. He has an appointment with his PCP tomorrow morning.  Pt reports his CBG was elevated at 391. Pt takes Novolog 70/30 and Lantus. Pt denies chest pain different than baseline but states he gets chest pain with exertion. Pt notes associated nausea which has resolved, dizziness, and fatigue with onset today. Pt denies any new symptoms in the last 6 hours. Wife also reports pt's CPAP is broken and he has been falling asleep sporadically recently. Pt denies vomiting and fever.   Past Medical History  Diagnosis Date  . Dyslipidemia   . Coronary artery disease     a. INF MI 1999: PCI of RCA;  b.  extensive stenting of LAD;  c. s/p CABG with RIMA->RCA in 2006;  d. Stable, Low risk MV 05/2012; e. 09/2012 Cath: stable anatomy->med Rx. f. Abnl nuc 11/2013 -> med rx; g. 11/2013 Cath/PCI: LM 30d, LAD 50ost, 20/50isr, 100d, LCX 40-50ost, OM2 50ost, RI 70-80ost sup branch, RCA 20p, 90/60d(2.25x17 & 2.25x24 Promus DES'), PDA 60p, RIMA->RCA ok.  . Hypertensive heart disease   . Hx of echocardiogram     a. Echo 3/12: mild LVH, EF 55-60%, trivial AI  . Anginal pain   . Myocardial infarction 1999; 2002; 2003; 2006; ~ 2008  . OSA on CPAP   . DM2 (diabetes mellitus, type 2)   . GERD (gastroesophageal reflux disease)   . History of gout   . CKD (chronic kidney disease), stage III   . Polycystic  kidney disease    Past Surgical History  Procedure Laterality Date  . Cystectomy  1990's    off face  . Left heart catheterization with coronary/graft angiogram N/A 09/16/2012    Procedure: LEFT HEART CATHETERIZATION WITH [name];  Surgeon: [name], MD;  Location: [facility] CATH LAB;  Service: Cardiovascular;  Laterality: N/A;  . Left heart catheterization with coronary/graft angiogram N/A 12/02/2013    Procedure: LEFT HEART CATHETERIZATION WITH [name];  Surgeon: [name], MD;  Location: [place] CATH LAB;  Service: Cardiovascular;  Laterality: N/A;  . Percutaneous coronary stent intervention (pci-s) N/A 12/03/2013    Procedure: PERCUTANEOUS CORONARY STENT INTERVENTION (PCI-S);  Surgeon: [name], MD;  Location: [facility] CATH LAB;  Service: Cardiovascular;  Laterality: N/A;  . Coronary artery bypass graft  2006    CABG X?; in [place]  . Coronary angioplasty with stent placement  1999 / 2002 / 2004 / 2006?  Marland Kitchen Cardiac catheterization  2012  . Cardiac catheterization  2013  . Cardiac catheterization  2014    LHC (1/14): total occlusion of distal LAD, diffuse disease in ramus, 70% proximal LCx, 50% proximal and mid RCA, 50% distal RCA, patent RIMA-RCA.   Family History  Problem Relation Age of Onset  . Lung cancer Mother   . Anemia Mother   .  Polycystic kidney disease Mother   . Diabetes Father   . Heart attack Father 4    Died suddenly  . Heart failure Father   . Hyperlipidemia Father   . Hypertension Father   . Kidney failure Sister 5    Died  . Heart attack Sister   . Hypertension Sister   . Diabetes Sister   . Diabetes Brother   . Polycystic kidney disease Brother   . Diabetes Sister    History  Substance Use Topics  . Smoking status: Former Smoker    Types: Cigarettes  . Smokeless tobacco: Never Used     Comment: smoked some as a teenager (high school)  . Alcohol Use: No     Comment: 09/15/2012 "drank a little when I was young"     Review of Systems  Constitutional: Positive for fatigue. Negative for fever.  Respiratory: Positive for cough.   Cardiovascular: Positive for chest pain (baseline).  Gastrointestinal: Positive for nausea. Negative for vomiting.  Neurological: Positive for dizziness.  All other systems reviewed and are negative.     Allergies  Ciprocin-fluocin-procin; Clarithromycin; Cleocin; and Glimepiride  Home Medications   Prior to Admission medications   Medication Sig Start Date End Date Taking? Authorizing Provider  ACCU-CHEK SOFTCLIX LANCETS lancets Use as instructed to check blood sugar 4 times per day dx code E.11.9 07/01/14   Elayne Snare, MD  acetaminophen (TYLENOL) 325 MG tablet Take 2 tablets (650 mg total) by mouth every 4 (four) hours as needed for headache or mild pain. 01/02/15   Erlene Quan, PA-C  amLODipine (NORVASC) 10 MG tablet Take 1 tablet (10 mg total) by mouth daily. 01/12/15   Darreld Mclean, MD  aspirin 81 MG tablet Take 1 tablet (81 mg total) by mouth daily. 12/04/13   Rogelia Mire, NP  B-D UF III MINI PEN NEEDLES 31G X 5 MM MISC USE ONE DAILY FOR INSULIN INJECTION 09/23/14   Gay Filler Copland, MD  carvedilol (COREG) 25 MG tablet TAKE 1 AND 1/2 TABLETS TWICE DAILY WITH MEALS 01/12/15   Darreld Mclean, MD  chlorpheniramine-HYDROcodone (TUSSIONEX PENNKINETIC ER) 10-8 MG/5ML SUER Take 5 mLs by mouth every 12 (twelve) hours as needed for cough. 02/15/15   Debby Freiberg, MD  clopidogrel (PLAVIX) 75 MG tablet Take 1 tablet (75 mg total) by mouth daily with breakfast. 12/25/13   Darreld Mclean, MD  colchicine 0.6 MG tablet Take 2 pills today, then may take 1 pill a day for the next 2-3 days if needed for gout 11/24/14   Gay Filler Copland, MD  fenofibrate (TRICOR) 145 MG tablet Take 1 tablet (145 mg total) by mouth daily. 11/08/14   Larey Dresser, MD  Fish Oil OIL Take 1 tablet by mouth daily.     Historical Provider, MD  gabapentin (NEURONTIN) 300 MG capsule Take 1  capsule (300 mg total) by mouth 3 (three) times daily. 12/25/13   Gay Filler Copland, MD  glucose blood (ACCU-CHEK AVIVA PLUS) test strip Use as instructed to check blood sugar 4 times per day dx code E11.9 07/01/14   Elayne Snare, MD  Insulin Glargine (LANTUS SOLOSTAR) 100 UNIT/ML Solostar Pen Take 28 units daily for diabetes 02/09/15   Gay Filler Copland, MD  insulin NPH-regular Human (NOVOLIN 70/30 RELION) (70-30) 100 UNIT/ML injection Inject 15 Units into the skin 3 (three) times daily. 08/10/14   Elayne Snare, MD  Insulin Syringe-Needle U-100 (INSULIN SYRINGE .5CC/31GX5/16") 31G X 5/16" 0.5 ML MISC Use  one needle three times per day 05/26/14   Elayne Snare, MD  isosorbide mononitrate (IMDUR) 60 MG 24 hr tablet Take 1 tablet (60 mg total) by mouth 2 (two) times daily. 02/03/15   Larey Dresser, MD  losartan (COZAAR) 100 MG tablet TAKE 1 TABLET EVERY DAY 02/02/15   Darreld Mclean, MD  Multiple Vitamin (MULTIVITAMIN WITH MINERALS) TABS tablet Take 1 tablet by mouth daily.    Historical Provider, MD  nitroGLYCERIN (NITROSTAT) 0.4 MG SL tablet Place 1 tablet (0.4 mg total) under the tongue every 5 (five) minutes as needed for chest pain. 11/03/14   Larey Dresser, MD  Omega-3 Fatty Acids (FISH OIL) 1200 MG CAPS Take 1 capsule (1,200 mg total) by mouth 2 (two) times daily. 02/03/15   Larey Dresser, MD  omeprazole (PRILOSEC) 20 MG capsule Take 1 capsule (20 mg total) by mouth daily as needed (indigestion). 01/02/15   Erlene Quan, PA-C  pravastatin (PRAVACHOL) 40 MG tablet TAKE 1 TABLET EVERY EVENING 12/20/14   Darreld Mclean, MD  ranolazine (RANEXA) 1000 MG SR tablet Take 1 tablet (1,000 mg total) by mouth 2 (two) times daily. 09/19/14   Rogelia Mire, NP  zolpidem (AMBIEN) 10 MG tablet TAKE 1 TABLET AT BEDTIME 01/05/15   Gay Filler Copland, MD   BP 148/86 mmHg  Pulse 80  Temp(Src) 98.4 F (36.9 C) (Oral)  Resp 14  SpO2 100% Physical Exam  Constitutional: He is oriented to person, place, and time. He  appears well-developed and well-nourished.  HENT:  Head: Normocephalic and atraumatic.  Eyes: Conjunctivae and EOM are normal.  Neck: Normal range of motion. Neck supple.  Cardiovascular: Normal rate, regular rhythm and normal heart sounds.   Pulmonary/Chest: Effort normal and breath sounds normal. No respiratory distress.  Abdominal: He exhibits no distension. There is no tenderness. There is no rebound and no guarding.  Musculoskeletal: Normal range of motion.  Neurological: He is alert and oriented to person, place, and time.  Skin: Skin is warm and dry.  Vitals reviewed.   ED Course  Procedures (including critical care time) DIAGNOSTIC STUDIES: Oxygen Saturation is 100% on room air, normal by my interpretation.    COORDINATION OF CARE: 11:20 PM Discussed treatment plan with patient at beside, the patient agrees with the plan and has no further questions at this time.   Labs Review Labs Reviewed  CBC - Abnormal; Notable for the following:    Hemoglobin 12.1 (*)    HCT 35.8 (*)    All other components within normal limits  BASIC METABOLIC PANEL - Abnormal; Notable for the following:    Glucose, Bld 315 (*)    BUN 31 (*)    Creatinine, Ser 2.32 (*)    GFR calc non Af Amer 30 (*)    GFR calc Af Amer 35 (*)    All other components within normal limits  BRAIN NATRIURETIC PEPTIDE  I-STAT TROPOININ, ED    Imaging Review Dg Chest 2 View  02/15/2015   CLINICAL DATA:  54 year old with shortness of breath and 3 days of upper respiratory symptoms  EXAM: CHEST  2 VIEW  COMPARISON:  Prior chest x-ray 12/31/2014  FINDINGS: The lungs are clear and negative for focal airspace consolidation, pulmonary edema or suspicious pulmonary nodule. No pleural effusion or pneumothorax. Cardiac and mediastinal contours are within normal limits. Status post median sternotomy. Stable nondisplaced fracture of the middle sternal wire. Minimal chronic bronchitic change. No acute fracture or lytic or  blastic  osseous lesions. The visualized upper abdominal bowel gas pattern is unremarkable.  IMPRESSION: No active cardiopulmonary disease.   Electronically Signed   By: Jacqulynn Cadet M.D.   On: 02/15/2015 21:16     EKG Interpretation None      MDM   Final diagnoses:  Cough  Upper respiratory infection    54 y.o. male with pertinent PMH of CAD, DM, sleep apnea, PKD presents with cough as above.  No fevers or other infectious signs.  Had some mild nausea this am, however this quickly resolved.  Although he does complain of some chest pain, this is normal for him to have angina, and he denies any increase from normal.  Specifically no new symptoms in 6 hours.  Discussed negative wu as above with pt and likely sleep apnea as etiology of quickly falling asleep.  Likely URI.  GIven prescription for tussionex.  DC home in stable condition.    I have reviewed all laboratory and imaging studies if ordered as above  1. Cough   2. Upper respiratory infection           Debby Freiberg, MD 02/15/15 2324

## 2015-02-15 NOTE — ED Notes (Signed)
Pt CBG, 305. Nurse was notified.

## 2015-02-15 NOTE — Discharge Instructions (Signed)
Cough, Adult  A cough is a reflex that helps clear your throat and airways. It can help heal the body or may be a reaction to an irritated airway. A cough may only last 2 or 3 weeks (acute) or may last more than 8 weeks (chronic).  CAUSES Acute cough:  Viral or bacterial infections. Chronic cough:  Infections.  Allergies.  Asthma.  Post-nasal drip.  Smoking.  Heartburn or acid reflux.  Some medicines.  Chronic lung problems (COPD).  Cancer. SYMPTOMS   Cough.  Fever.  Chest pain.  Increased breathing rate.  High-pitched whistling sound when breathing (wheezing).  Colored mucus that you cough up (sputum). TREATMENT   A bacterial cough may be treated with antibiotic medicine.  A viral cough must run its course and will not respond to antibiotics.  Your caregiver may recommend other treatments if you have a chronic cough. HOME CARE INSTRUCTIONS   Only take over-the-counter or prescription medicines for pain, discomfort, or fever as directed by your caregiver. Use cough suppressants only as directed by your caregiver.  Use a cold steam vaporizer or humidifier in your bedroom or home to help loosen secretions.  Sleep in a semi-upright position if your cough is worse at night.  Rest as needed.  Stop smoking if you smoke. SEEK IMMEDIATE MEDICAL CARE IF:   You have pus in your sputum.  Your cough starts to worsen.  You cannot control your cough with suppressants and are losing sleep.  You begin coughing up blood.  You have difficulty breathing.  You develop pain which is getting worse or is uncontrolled with medicine.  You have a fever. MAKE SURE YOU:   Understand these instructions.  Will watch your condition.  Will get help right away if you are not doing well or get worse. Document Released: 02/23/2011 Document Revised: 11/19/2011 Document Reviewed: 02/23/2011 Exit[employer] Patient Information 2015 [employer]. This information is not intended  to replace advice given to you by your health care provider. Make sure you discuss any questions you have with your health care provider.  

## 2015-02-15 NOTE — Telephone Encounter (Signed)
Pt hasn't been seen since March. Informed him he needs to be seen. Pt understood.

## 2015-02-15 NOTE — ED Notes (Signed)
Pt. reports central chest pain with SOB , productive cough and elevated blood sugar this evening 312 , took insulin 70/30 30 units and Lantus 20 units prior to arrival . Denies nausea or diaphoresis .

## 2015-02-16 LAB — CBG MONITORING, ED: Glucose-Capillary: 305 mg/dL — ABNORMAL HIGH (ref 65–99)

## 2015-02-17 ENCOUNTER — Encounter: Payer: Self-pay | Admitting: Neurology

## 2015-02-17 ENCOUNTER — Ambulatory Visit (INDEPENDENT_AMBULATORY_CARE_PROVIDER_SITE_OTHER): Payer: Commercial Managed Care - HMO | Admitting: Neurology

## 2015-02-17 VITALS — BP 132/70 | HR 90 | Resp 22 | Ht 65.0 in | Wt 259.0 lb

## 2015-02-17 DIAGNOSIS — G4719 Other hypersomnia: Secondary | ICD-10-CM

## 2015-02-17 DIAGNOSIS — R519 Headache, unspecified: Secondary | ICD-10-CM

## 2015-02-17 DIAGNOSIS — J069 Acute upper respiratory infection, unspecified: Secondary | ICD-10-CM | POA: Diagnosis not present

## 2015-02-17 DIAGNOSIS — E1165 Type 2 diabetes mellitus with hyperglycemia: Secondary | ICD-10-CM | POA: Diagnosis not present

## 2015-02-17 DIAGNOSIS — G4733 Obstructive sleep apnea (adult) (pediatric): Secondary | ICD-10-CM | POA: Diagnosis not present

## 2015-02-17 DIAGNOSIS — R351 Nocturia: Secondary | ICD-10-CM | POA: Diagnosis not present

## 2015-02-17 DIAGNOSIS — IMO0002 Reserved for concepts with insufficient information to code with codable children: Secondary | ICD-10-CM

## 2015-02-17 DIAGNOSIS — R51 Headache: Secondary | ICD-10-CM

## 2015-02-17 NOTE — Patient Instructions (Signed)
Based on your symptoms and your exam I believe you are at risk for obstructive sleep apnea or OSA, and I think we should proceed with a sleep study to determine whether you do or do not have OSA and how severe it is. If you have more than mild OSA, I want you to consider treatment with CPAP. Please remember, the risks and ramifications of moderate to severe obstructive sleep apnea or OSA are: Cardiovascular disease, including congestive heart failure, stroke, difficult to control hypertension, arrhythmias, and even type 2 diabetes has been linked to untreated OSA. Sleep apnea causes disruption of sleep and sleep deprivation in most cases, which, in turn, can cause recurrent headaches, problems with memory, mood, concentration, focus, and vigilance. Most people with untreated sleep apnea report excessive daytime sleepiness, which can affect their ability to drive. Please do not drive if you feel sleepy.  I will see you back after your sleep study to go over the test results and where to go from there. We will call you after your sleep study and to set up an appointment at the time.   Our sleep lab administrative assistant, Angelina Sheriff will call you to schedule your sleep study. If you don't hear back from her by next week please feel free to call her at [phone]. This is her direct line and please leave a message with your phone number to call back if you get the voicemail box.

## 2015-02-17 NOTE — Progress Notes (Signed)
Subjective:    Patient ID: Matthew Rowe. is a 54 y.o. male.  HPI     Star Age, MD, PhD [facility] [address], [address] [place], [place] [address]  Dear Dr. Lorelei Pont,   I saw your patient, Matthew Rowe, upon your kind request in my neurologic clinic today for initial consultation of his sleep disorder, in particular, reevaluation of his obstructive sleep apnea. The patient is accompanied by his wife today. As you know, Matthew Rowe is a 54 year old right-handed gentleman with an underlying medical history of heart disease, hypertension, diabetes, chronic renal insufficiency, status post cardiac stents and status post CABG single-vessel in 2008, recent admissions in January 2016 with chest pain in the setting of hypertensive urgency in the context of medical noncompliance as well as more recent admission on 12/31/2014 for unstable angina, again deemed secondary to medical noncompliance, who reports a prior diagnosis of obstructive sleep apnea. Prior sleep test results are not available for my review. He has been on CPAP therapy for the past 7 years or so. He has not had a follow-up for sleep apnea treatment of the past 5 years or so. Unfortunately his CPAP machine broke about 3 months ago and he had the same machine for about 7 years. I reviewed his recent hospital records from his admission from 12/31/2014. He was discharged on 01/02/2015. His troponin was negative 3. 2-D echocardiogram from 01/02/2015 showed: Normal left ventricular cavity size. Mild LVH, EF estimated at 50-55%. He had grade 1 diastolic dysfunction, very mild aortic stenosis, mild aortic regurgitation, mild mitral regurgitation.  He went back to the ER on 02/15/15 with CP and cough and was diagnosed with a URI and given a Rx for tussionex, but has not filled the Rx yet d/t cost. He does not keep a regular sleep and wake schedule. He was diagnosed with sleep apnea when he was still in  [place]. He moved down here about 5 years ago. He has 4 grown children (from his previous 2 marriages), 3 are in [place] and 1 in [place] in the [place]. He is not aware of any family history of OSA. When he was on CPAP therapy he felt better. He reports compliance with treatment. He had a nasal mask and before then he had a nasal pillows interface. He is currently congested and has some difficulty breathing at night because of that. He snores and has apneic pauses in his sleep. He wakes up several times in the middle of the night and does not feel rested. His Epworth sleepiness score today is 20 out of 24. He reports occasional morning headaches and significant nocturia, on average 3 times per night. His blood sugar this morning was around 160. He reports weight gain in the past few years. He smoked cigarettes a little when he was a teenager but was never smoker. He does not drink alcohol. He drinks caffeine in the form of sodas but not every day.  His Past Medical History Is Significant For: Past Medical History  Diagnosis Date  . Dyslipidemia   . Coronary artery disease     a. INF MI 1999: PCI of RCA;  b.  extensive stenting of LAD;  c. s/p CABG with RIMA->RCA in 2006;  d. Stable, Low risk MV 05/2012; e. 09/2012 Cath: stable anatomy->med Rx. f. Abnl nuc 11/2013 -> med rx; g. 11/2013 Cath/PCI: LM 30d, LAD 50ost, 20/50isr, 100d, LCX 40-50ost, OM2 50ost, RI 70-80ost sup branch, RCA 20p, 90/60d(2.25x17 &  2.25x24 Promus DES'), PDA 60p, RIMA->RCA ok.  . Hypertensive heart disease   . Hx of echocardiogram     a. Echo 3/12: mild LVH, EF 55-60%, trivial AI  . Anginal pain   . Myocardial infarction 1999; 2002; 2003; 2006; ~ 2008  . OSA on CPAP   . DM2 (diabetes mellitus, type 2)   . GERD (gastroesophageal reflux disease)   . History of gout   . CKD (chronic kidney disease), stage III   . Polycystic kidney disease     His Past Surgical History Is Significant For: Past Surgical History  Procedure Laterality  Date  . Cystectomy  1990's    off face  . Left heart catheterization with coronary/graft angiogram N/A 09/16/2012    Procedure: LEFT HEART CATHETERIZATION WITH [name];  Surgeon: [name], MD;  Location: [facility] CATH LAB;  Service: Cardiovascular;  Laterality: N/A;  . Left heart catheterization with coronary/graft angiogram N/A 12/02/2013    Procedure: LEFT HEART CATHETERIZATION WITH [name];  Surgeon: [name], MD;  Location: [facility] CATH LAB;  Service: Cardiovascular;  Laterality: N/A;  . Percutaneous coronary stent intervention (pci-s) N/A 12/03/2013    Procedure: PERCUTANEOUS CORONARY STENT INTERVENTION (PCI-S);  Surgeon: [name], MD;  Location: [facility] CATH LAB;  Service: Cardiovascular;  Laterality: N/A;  . Coronary artery bypass graft  2006    CABG X?; in [place]  . Coronary angioplasty with stent placement  1999 / 2002 / 2004 / 2006?  Marland Kitchen Cardiac catheterization  2012  . Cardiac catheterization  2013  . Cardiac catheterization  2014    LHC (1/14): total occlusion of distal LAD, diffuse disease in ramus, 70% proximal LCx, 50% proximal and mid RCA, 50% distal RCA, patent RIMA-RCA.    His Family History Is Significant For: Family History  Problem Relation Age of Onset  . Lung cancer Mother   . Anemia Mother   . Polycystic kidney disease Mother   . Diabetes Father   . Heart attack Father 85    Died suddenly  . Heart failure Father   . Hyperlipidemia Father   . Hypertension Father   . Kidney failure Sister 5    Died  . Heart attack Sister   . Hypertension Sister   . Diabetes Sister   . Diabetes Brother   . Polycystic kidney disease Brother   . Diabetes Sister     His Social History Is Significant For: History   Social History  . Marital Status: Married    Spouse Name: [name]  . Number of Children: 5  . Years of Education: N/A   Occupational History  . retired     Chartered loss adjuster   Social History Main Topics  . Smoking  status: Former Smoker    Types: Cigarettes  . Smokeless tobacco: Never Used     Comment: smoked some as a teenager (high school)  . Alcohol Use: No     Comment: 09/15/2012 "drank a little when I was young"  . Drug Use: No  . Sexual Activity: Yes   Other Topics Concern  . None   Social History Narrative   Married and lives with wife in [place]. On disability.    Consumes about 2 Coke Zeros a day     His Allergies Are:  Allergies  Allergen Reactions  . Ciprocin-Fluocin-Procin [Fluocinolone Acetonide] Rash  . Clarithromycin Itching and Other (See Comments)    "Biaxin" Eyes itch and burn  . Cleocin [Clindamycin Hcl] Anaphylaxis and Swelling  .  Glimepiride [Amaryl] Other (See Comments)    Elevates liver function   :   His Current Medications Are:  Outpatient Encounter Prescriptions as of 02/17/2015  Medication Sig  . ACCU-CHEK SOFTCLIX LANCETS lancets Use as instructed to check blood sugar 4 times per day dx code E.11.9  . acetaminophen (TYLENOL) 325 MG tablet Take 2 tablets (650 mg total) by mouth every 4 (four) hours as needed for headache or mild pain.  Marland Kitchen amLODipine (NORVASC) 10 MG tablet Take 1 tablet (10 mg total) by mouth daily.  Marland Kitchen aspirin 81 MG tablet Take 1 tablet (81 mg total) by mouth daily.  . B-D UF III MINI PEN NEEDLES 31G X 5 MM MISC USE ONE DAILY FOR INSULIN INJECTION  . carvedilol (COREG) 25 MG tablet TAKE 1 AND 1/2 TABLETS TWICE DAILY WITH MEALS  . chlorpheniramine-HYDROcodone (TUSSIONEX PENNKINETIC ER) 10-8 MG/5ML SUER Take 5 mLs by mouth every 12 (twelve) hours as needed for cough.  . clopidogrel (PLAVIX) 75 MG tablet Take 1 tablet (75 mg total) by mouth daily with breakfast.  . colchicine 0.6 MG tablet Take 2 pills today, then may take 1 pill a day for the next 2-3 days if needed for gout  . fenofibrate (TRICOR) 145 MG tablet Take 1 tablet (145 mg total) by mouth daily.  Marland Kitchen gabapentin (NEURONTIN) 300 MG capsule Take 1 capsule (300 mg total) by mouth 3 (three) times  daily.  Marland Kitchen glucose blood (ACCU-CHEK AVIVA PLUS) test strip Use as instructed to check blood sugar 4 times per day dx code E11.9  . hydrochlorothiazide (HYDRODIURIL) 25 MG tablet   . Insulin Glargine (LANTUS SOLOSTAR) 100 UNIT/ML Solostar Pen Take 28 units daily for diabetes (Patient taking differently: Inject 25 Units into the skin at bedtime. Take 25 units daily for diabetes)  . insulin NPH-regular Human (NOVOLIN 70/30 RELION) (70-30) 100 UNIT/ML injection Inject 15 Units into the skin 3 (three) times daily.  . Insulin Syringe-Needle U-100 (INSULIN SYRINGE .5CC/31GX5/16") 31G X 5/16" 0.5 ML MISC Use one needle three times per day  . isosorbide mononitrate (IMDUR) 60 MG 24 hr tablet Take 1 tablet (60 mg total) by mouth 2 (two) times daily.  Marland Kitchen losartan (COZAAR) 100 MG tablet TAKE 1 TABLET EVERY DAY  . Multiple Vitamin (MULTIVITAMIN WITH MINERALS) TABS tablet Take 1 tablet by mouth daily.  . nitroGLYCERIN (NITROSTAT) 0.4 MG SL tablet Place 1 tablet (0.4 mg total) under the tongue every 5 (five) minutes as needed for chest pain.  . Omega-3 Fatty Acids (FISH OIL) 1200 MG CAPS Take 1 capsule (1,200 mg total) by mouth 2 (two) times daily.  Marland Kitchen omeprazole (PRILOSEC) 20 MG capsule Take 1 capsule (20 mg total) by mouth daily as needed (indigestion).  . pravastatin (PRAVACHOL) 40 MG tablet TAKE 1 TABLET EVERY EVENING  . ranolazine (RANEXA) 1000 MG SR tablet Take 1 tablet (1,000 mg total) by mouth 2 (two) times daily.  Marland Kitchen zolpidem (AMBIEN) 10 MG tablet TAKE 1 TABLET AT BEDTIME   No facility-administered encounter medications on file as of 02/17/2015.  :  Review of Systems:  Out of a complete 14 point review of systems, all are reviewed and negative with the exception of these symptoms as listed below:   Review of Systems  Neurological:       Snoring, witnessed apnea, wakes up feeling tired in the morning, wakes up many times in night since not being on CPAP.     Objective:  Neurologic Exam  Physical  Exam Physical Examination:  Filed Vitals:   02/17/15 1026  BP: 132/70  Pulse: 90  Resp: 22    General Examination: The patient is a very pleasant 54 y.o. male in no acute distress. He appears well-developed and well-nourished and adequately groomed. He is obese.  HEENT: Normocephalic, atraumatic, pupils are equal, round and reactive to light and accommodation. Funduscopic exam is normal with sharp disc margins noted. Extraocular tracking is good without limitation to gaze excursion or nystagmus noted. Normal smooth pursuit is noted. Hearing is grossly intact. Tympanic membranes are clear bilaterally. Face is symmetric with normal facial animation and normal facial sensation. Speech is nasal sounding and he sounds congested with mouth breathing noted. No dysarthria noted. There is no hypophonia. There is no lip, neck/head, jaw or voice tremor. Neck is supple with full range of passive and active motion. There are no carotid bruits on auscultation. Oropharynx exam reveals: mild mouth dryness, adequate dental hygiene and severe airway crowding, due to thick soft palate and large tongue. Mallampati is class III. Tongue protrudes centrally and palate elevates symmetrically. Tonsils are not visualized. Neck size is 20.75 inches. He has a Mild overbite. Nasal inspection reveals no significant nasal mucosal bogginess, but mild redness and no septal deviation.   Chest: Clear to auscultation without wheezing, rhonchi or crackles noted.  Heart: S1+S2+0, regular and normal without murmurs, rubs or gallops noted.   Abdomen: Soft, non-tender and non-distended with normal bowel sounds appreciated on auscultation.  Extremities: There is trace pitting edema in the distal lower extremities bilaterally. Pedal pulses are intact. He reports mild right foot pain. He says this feels like his gout may be flaring up.  Skin: Warm and dry without trophic changes noted. There are no varicose veins.  Musculoskeletal:  exam reveals no obvious joint deformities, tenderness or joint swelling or erythema. No obvious gout-like changes are seen on his right foot.  Neurologically:  Mental status: The patient is awake, alert and oriented in all 4 spheres. His immediate and remote memory, attention, language skills and fund of knowledge are appropriate. There is no evidence of aphasia, agnosia, apraxia or anomia. Speech is clear with normal prosody and enunciation. Thought process is linear. Mood is normal and affect is normal.  Cranial nerves II - XII are as described above under HEENT exam. In addition: shoulder shrug is normal with equal shoulder height noted. Motor exam: Normal bulk, strength and tone is noted. There is no drift, tremor or rebound. Romberg is negative. Reflexes are 2+ throughout. Babinski: Toes are flexor bilaterally. Fine motor skills and coordination: intact with normal finger taps, normal hand movements, normal rapid alternating patting, normal foot taps and normal foot agility.  Cerebellar testing: No dysmetria or intention tremor on finger to nose testing. Heel to shin is difficult bilaterally, secondary to body habitus. There is no truncal or gait ataxia.  Sensory exam: intact to light touch, pinprick, vibration, temperature sense in the upper and lower extremities, with the exception of hypersensitivity to pinprick noted in both feet.   Gait, station and balance: He stands with difficulty. No veering to one side is noted. No leaning to one side is noted. Posture is age-appropriate and stance is narrow based. Gait shows normal stride length and normal pace. No problems turning are noted. He turns en bloc. Tandem walk is slightly difficult for him.                Assessment and Plan:   In summary, Matthew VOLLRATH Sr. is a very  pleasant 54 y.o.-year old male with an underlying medical history of heart disease, hypertension, diabetes, chronic renal insufficiency, status post cardiac stents and status  post CABG single-vessel in 2008, recent admissions in January 2016 with chest pain in the setting of hypertensive urgency in the context of medical noncompliance as well as more recent admission on 12/31/2014 for unstable angina, again deemed secondary to medical noncompliance, who reports a prior diagnosis of obstructive sleep apnea. He had a CPAP machine for about 7 years. His machine recently broke about 3 months ago. His history and physical exam are in keeping with ongoing issues with obstructive sleep apnea. Prior sleep test results are not available for my review. His prior CPAP pressure setting is also not known.  I had a long chat with the patient and his wife about my findings and the diagnosis of OSA, its prognosis and treatment options. We talked about medical treatments, surgical interventions and non-pharmacological approaches. I explained in particular the risks and ramifications of untreated moderate to severe OSA, especially with respect to developing cardiovascular disease down the Road, including congestive heart failure, difficult to treat hypertension, cardiac arrhythmias, or stroke. Even type 2 diabetes has, in part, been linked to untreated OSA. Symptoms of untreated OSA include daytime sleepiness, memory problems, mood irritability and mood disorder such as depression and anxiety, lack of energy, as well as recurrent headaches, especially morning headaches. We talked about trying to maintain a healthy lifestyle in general, as well as the importance of weight control. I encouraged the patient to eat healthy, exercise daily and keep well hydrated, to keep a scheduled bedtime and wake time routine, to not skip any meals and eat healthy snacks in between meals. I advised the patient not to drive when feeling sleepy. I recommended the following at this time: sleep study with potential positive airway pressure titration. (We will score hypopneas at 4% and split the sleep study into diagnostic and  treatment portion, if the estimated. 2 hour AHI is >15/h).   I explained the sleep test procedure to the patient and also outlined possible surgical and non-surgical treatment options of OSA, including the use of a custom-made dental device (which would require a referral to a specialist dentist or oral surgeon), upper airway surgical options, such as pillar implants, radiofrequency surgery, tongue base surgery, and UPPP (which would involve a referral to an ENT surgeon). Rarely, jaw surgery such as mandibular advancement may be considered.  I also explained the CPAP treatment option to the patient, who indicated that he would be willing to try CPAP again if the need arises. I explained the importance of being compliant with PAP treatment, not only for insurance purposes but primarily to improve His symptoms, and for the patient's long term health benefit, including to reduce His cardiovascular risks. I answered all their questions today and the patient and his wife were in agreement. I would like to see him back after the sleep study is completed and encouraged him to call with any interim questions, concerns, problems or updates.   Thank you very much for allowing me to participate in the care of this nice patient. If I can be of any further assistance to you please do not hesitate to call me at [phone].  Sincerely,   Star Age, MD, PhD

## 2015-02-21 NOTE — Telephone Encounter (Signed)
Called to make sure all is ok with his lantus pt assistance. He states that he received word that all is taken care of

## 2015-02-24 ENCOUNTER — Telehealth: Payer: Self-pay

## 2015-02-24 NOTE — Telephone Encounter (Signed)
Received fax from [facility] requesting a prior auth for Ranexa ER 1000mg . Did this on Cover My Meds and received a notification that a PA was not needed.

## 2015-03-07 ENCOUNTER — Ambulatory Visit (INDEPENDENT_AMBULATORY_CARE_PROVIDER_SITE_OTHER): Payer: Commercial Managed Care - HMO | Admitting: Neurology

## 2015-03-07 DIAGNOSIS — G479 Sleep disorder, unspecified: Secondary | ICD-10-CM

## 2015-03-07 DIAGNOSIS — G4730 Sleep apnea, unspecified: Secondary | ICD-10-CM

## 2015-03-07 DIAGNOSIS — G4733 Obstructive sleep apnea (adult) (pediatric): Secondary | ICD-10-CM | POA: Diagnosis not present

## 2015-03-07 DIAGNOSIS — G4710 Hypersomnia, unspecified: Secondary | ICD-10-CM

## 2015-03-07 DIAGNOSIS — G4761 Periodic limb movement disorder: Secondary | ICD-10-CM

## 2015-03-08 ENCOUNTER — Telehealth: Payer: Self-pay

## 2015-03-08 ENCOUNTER — Telehealth: Payer: Self-pay | Admitting: Neurology

## 2015-03-08 DIAGNOSIS — G4733 Obstructive sleep apnea (adult) (pediatric): Secondary | ICD-10-CM

## 2015-03-08 NOTE — Telephone Encounter (Signed)
I spoke to patient. He is aware of results and would like to start treatment on a new machine. I will send his referral to [facility]. He made f/u in August. I will send a reminder letter to patient to remind him about appt and the importance of compliance.

## 2015-03-08 NOTE — Telephone Encounter (Signed)
Patient seen on 02/17/15. Split night study on 03/07/15. Ins: [employer]; he may have an existing DME, as he has been a CPAP user until machine broke 3-4 mo ago.   Please call and notify patient that the recent sleep study confirmed the diagnosis of severe OSA. He did very well with CPAP during the study with significant improvement of the respiratory events. Therefore, I would like start the patient on CPAP therapy at home by prescribing a NEW machine for home use. I placed the order in the chart. The patient will need a follow up appointment with me in 8 to 10 weeks post set up that has to be scheduled; please go ahead and schedule while you have the patient on the phone and make sure patient understands the importance of keeping this window for the FU appointment, as it is often an insurance requirement and failing to adhere to this may result in losing coverage for sleep apnea treatment. 15 min follow-up should suffice, unless there is a 30 min FU slot available.  Please re-enforce the importance of compliance with treatment and the need for [place] to monitor compliance data - again an insurance requirement and good feedback for the patient as far as how they are doing.  Also remind patient, that any upcoming CPAP machine or mask issues, should be first addressed with the DME company. Please ask if patient has a preference regarding DME company.  Please arrange for CPAP set up at home through a DME company of patient's choice - once you have spoken to the patient - and faxed/routed report to PCP and referring MD (if other than PCP), you can close this encounter, thanks,   Star Age, MD, PhD [facility] ([place])

## 2015-03-08 NOTE — Sleep Study (Signed)
Please see the scanned sleep study interpretation located in the Procedure tab within the Chart Review section. 

## 2015-03-08 NOTE — Telephone Encounter (Signed)
Patient seen on 02/17/15. Split night study on 03/07/15. Ins: [employer]; he may have an existing DME, as he has been a CPAP user until machine broke 3-4 mo ago.   Please call and notify patient that the recent sleep study confirmed the diagnosis of severe OSA. He did very well with CPAP during the study with significant improvement of the respiratory events. Therefore, I would like start the patient on CPAP therapy at home by prescribing a NEW machine for home use. I placed the order in the chart. The patient will need a follow up appointment with me in 8 to 10 weeks post set up that has to be scheduled; please go ahead and schedule while you have the patient on the phone and make sure patient understands the importance of keeping this window for the FU appointment, as it is often an insurance requirement and failing to adhere to this may result in losing coverage for sleep apnea treatment. 15 min follow-up should suffice, unless there is a 30 min FU slot available.  Please re-enforce the importance of compliance with treatment and the need for [place] to monitor compliance data - again an insurance requirement and good feedback for the patient as far as how they are doing.  Also remind patient, that any upcoming CPAP machine or mask issues, should be first addressed with the DME company. Please ask if patient has a preference regarding DME company.  Please arrange for CPAP set up at home through a DME company of patient's choice - once you have spoken to the patient - and faxed/routed report to PCP and referring MD (if other than PCP), you can close this encounter, thanks,   Star Age, MD, PhD [facility] ([place])

## 2015-03-18 ENCOUNTER — Other Ambulatory Visit: Payer: Self-pay | Admitting: Family Medicine

## 2015-03-25 DIAGNOSIS — H6123 Impacted cerumen, bilateral: Secondary | ICD-10-CM | POA: Diagnosis not present

## 2015-03-25 DIAGNOSIS — H9202 Otalgia, left ear: Secondary | ICD-10-CM | POA: Diagnosis not present

## 2015-03-25 DIAGNOSIS — H60502 Unspecified acute noninfective otitis externa, left ear: Secondary | ICD-10-CM | POA: Diagnosis not present

## 2015-03-25 DIAGNOSIS — H6692 Otitis media, unspecified, left ear: Secondary | ICD-10-CM | POA: Diagnosis not present

## 2015-03-26 ENCOUNTER — Other Ambulatory Visit: Payer: Self-pay | Admitting: Family Medicine

## 2015-03-29 ENCOUNTER — Other Ambulatory Visit: Payer: Self-pay | Admitting: Family Medicine

## 2015-03-30 ENCOUNTER — Other Ambulatory Visit: Payer: Self-pay

## 2015-03-30 DIAGNOSIS — G4733 Obstructive sleep apnea (adult) (pediatric): Secondary | ICD-10-CM | POA: Diagnosis not present

## 2015-03-30 MED ORDER — ALCOHOL SWABS PADS: MEDICATED_PAD | Status: DC

## 2015-03-30 MED ORDER — BLOOD GLUCOSE MONITOR KIT: PACK | Status: DC

## 2015-03-30 MED ORDER — GLUCOSE BLOOD VI STRP: ORAL_STRIP | Status: DC

## 2015-03-30 MED ORDER — SURECHEK CONTROL SOLUTION NORMAL VI LIQD: Status: DC

## 2015-03-30 MED ORDER — ACCU-CHEK SOFTCLIX LANCETS MISC: Status: DC

## 2015-04-08 DIAGNOSIS — G4733 Obstructive sleep apnea (adult) (pediatric): Secondary | ICD-10-CM | POA: Diagnosis not present

## 2015-04-15 ENCOUNTER — Encounter: Payer: Self-pay | Admitting: *Deleted

## 2015-04-19 DIAGNOSIS — I219 Acute myocardial infarction, unspecified: Secondary | ICD-10-CM | POA: Insufficient documentation

## 2015-04-19 DIAGNOSIS — E1169 Type 2 diabetes mellitus with other specified complication: Secondary | ICD-10-CM | POA: Diagnosis not present

## 2015-04-19 DIAGNOSIS — I10 Essential (primary) hypertension: Secondary | ICD-10-CM | POA: Diagnosis not present

## 2015-04-19 DIAGNOSIS — E785 Hyperlipidemia, unspecified: Secondary | ICD-10-CM | POA: Diagnosis not present

## 2015-04-19 DIAGNOSIS — E1129 Type 2 diabetes mellitus with other diabetic kidney complication: Secondary | ICD-10-CM | POA: Diagnosis not present

## 2015-04-19 DIAGNOSIS — E1165 Type 2 diabetes mellitus with hyperglycemia: Secondary | ICD-10-CM | POA: Diagnosis not present

## 2015-05-02 ENCOUNTER — Ambulatory Visit: Payer: Self-pay | Admitting: Neurology

## 2015-05-08 ENCOUNTER — Inpatient Hospital Stay (HOSPITAL_COMMUNITY): Payer: Commercial Managed Care - HMO

## 2015-05-08 ENCOUNTER — Emergency Department (HOSPITAL_COMMUNITY): Payer: Commercial Managed Care - HMO

## 2015-05-08 ENCOUNTER — Inpatient Hospital Stay (HOSPITAL_COMMUNITY)
Admission: EM | Admit: 2015-05-08 | Discharge: 2015-05-08 | DRG: 313 | Disposition: A | Discharge status: Home / Self Care | Payer: Commercial Managed Care - HMO | Attending: Internal Medicine | Admitting: Internal Medicine

## 2015-05-08 ENCOUNTER — Encounter (HOSPITAL_COMMUNITY): Payer: Self-pay | Admitting: Emergency Medicine

## 2015-05-08 DIAGNOSIS — R0602 Shortness of breath: Secondary | ICD-10-CM | POA: Diagnosis not present

## 2015-05-08 DIAGNOSIS — I2583 Coronary atherosclerosis due to lipid rich plaque: Secondary | ICD-10-CM

## 2015-05-08 DIAGNOSIS — Z87891 Personal history of nicotine dependence: Secondary | ICD-10-CM

## 2015-05-08 DIAGNOSIS — R0789 Other chest pain: Principal | ICD-10-CM | POA: Diagnosis present

## 2015-05-08 DIAGNOSIS — Z7982 Long term (current) use of aspirin: Secondary | ICD-10-CM | POA: Diagnosis not present

## 2015-05-08 DIAGNOSIS — Z955 Presence of coronary angioplasty implant and graft: Secondary | ICD-10-CM

## 2015-05-08 DIAGNOSIS — I10 Essential (primary) hypertension: Secondary | ICD-10-CM

## 2015-05-08 DIAGNOSIS — M109 Gout, unspecified: Secondary | ICD-10-CM | POA: Diagnosis present

## 2015-05-08 DIAGNOSIS — I2510 Atherosclerotic heart disease of native coronary artery without angina pectoris: Secondary | ICD-10-CM

## 2015-05-08 DIAGNOSIS — E1169 Type 2 diabetes mellitus with other specified complication: Secondary | ICD-10-CM | POA: Diagnosis present

## 2015-05-08 DIAGNOSIS — I5032 Chronic diastolic (congestive) heart failure: Secondary | ICD-10-CM

## 2015-05-08 DIAGNOSIS — E1122 Type 2 diabetes mellitus with diabetic chronic kidney disease: Secondary | ICD-10-CM | POA: Diagnosis present

## 2015-05-08 DIAGNOSIS — Z6841 Body Mass Index (BMI) 40.0 and over, adult: Secondary | ICD-10-CM

## 2015-05-08 DIAGNOSIS — Z9861 Coronary angioplasty status: Secondary | ICD-10-CM

## 2015-05-08 DIAGNOSIS — T502X5A Adverse effect of carbonic-anhydrase inhibitors, benzothiadiazides and other diuretics, initial encounter: Secondary | ICD-10-CM | POA: Diagnosis present

## 2015-05-08 DIAGNOSIS — Z794 Long term (current) use of insulin: Secondary | ICD-10-CM

## 2015-05-08 DIAGNOSIS — Q613 Polycystic kidney, unspecified: Secondary | ICD-10-CM | POA: Diagnosis not present

## 2015-05-08 DIAGNOSIS — I152 Hypertension secondary to endocrine disorders: Secondary | ICD-10-CM | POA: Diagnosis present

## 2015-05-08 DIAGNOSIS — E669 Obesity, unspecified: Secondary | ICD-10-CM | POA: Diagnosis not present

## 2015-05-08 DIAGNOSIS — Z881 Allergy status to other antibiotic agents status: Secondary | ICD-10-CM | POA: Diagnosis not present

## 2015-05-08 DIAGNOSIS — N183 Chronic kidney disease, stage 3 (moderate): Secondary | ICD-10-CM

## 2015-05-08 DIAGNOSIS — R072 Precordial pain: Secondary | ICD-10-CM | POA: Diagnosis present

## 2015-05-08 DIAGNOSIS — I252 Old myocardial infarction: Secondary | ICD-10-CM | POA: Diagnosis not present

## 2015-05-08 DIAGNOSIS — Z888 Allergy status to other drugs, medicaments and biological substances status: Secondary | ICD-10-CM

## 2015-05-08 DIAGNOSIS — I130 Hypertensive heart and chronic kidney disease with heart failure and stage 1 through stage 4 chronic kidney disease, or unspecified chronic kidney disease: Secondary | ICD-10-CM | POA: Diagnosis not present

## 2015-05-08 DIAGNOSIS — E785 Hyperlipidemia, unspecified: Secondary | ICD-10-CM

## 2015-05-08 DIAGNOSIS — N179 Acute kidney failure, unspecified: Secondary | ICD-10-CM | POA: Diagnosis not present

## 2015-05-08 DIAGNOSIS — K219 Gastro-esophageal reflux disease without esophagitis: Secondary | ICD-10-CM | POA: Diagnosis present

## 2015-05-08 DIAGNOSIS — Z7902 Long term (current) use of antithrombotics/antiplatelets: Secondary | ICD-10-CM | POA: Diagnosis not present

## 2015-05-08 DIAGNOSIS — R079 Chest pain, unspecified: Secondary | ICD-10-CM | POA: Diagnosis not present

## 2015-05-08 DIAGNOSIS — Z951 Presence of aortocoronary bypass graft: Secondary | ICD-10-CM | POA: Diagnosis not present

## 2015-05-08 DIAGNOSIS — E1121 Type 2 diabetes mellitus with diabetic nephropathy: Secondary | ICD-10-CM | POA: Diagnosis present

## 2015-05-08 DIAGNOSIS — G4733 Obstructive sleep apnea (adult) (pediatric): Secondary | ICD-10-CM

## 2015-05-08 DIAGNOSIS — Z9989 Dependence on other enabling machines and devices: Secondary | ICD-10-CM

## 2015-05-08 LAB — GLUCOSE, CAPILLARY
GLUCOSE-CAPILLARY: 265 mg/dL — AB (ref 65–99)
GLUCOSE-CAPILLARY: 213 mg/dL — AB (ref 65–99)
GLUCOSE-CAPILLARY: 346 mg/dL — AB (ref 65–99)

## 2015-05-08 LAB — BASIC METABOLIC PANEL
Anion gap: 8 (ref 5–15)
BUN: 31 mg/dL — AB (ref 6–20)
CHLORIDE: 107 mmol/L (ref 101–111)
CO2: 18 mmol/L — ABNORMAL LOW (ref 22–32)
Calcium: 8.9 mg/dL (ref 8.9–10.3)
Creatinine, Ser: 2.53 mg/dL — ABNORMAL HIGH (ref 0.61–1.24)
GFR calc Af Amer: 31 mL/min — ABNORMAL LOW (ref >60)
GFR calc non Af Amer: 27 mL/min — ABNORMAL LOW (ref >60)
GLUCOSE: 364 mg/dL — AB (ref 65–99)
POTASSIUM: 4.4 mmol/L (ref 3.5–5.1)
SODIUM: 133 mmol/L — AB (ref 135–145)

## 2015-05-08 LAB — COMPREHENSIVE METABOLIC PANEL
ALK PHOS: 82 U/L (ref 38–126)
ALT: 17 U/L (ref 17–63)
ANION GAP: 8 (ref 5–15)
AST: 22 U/L (ref 15–41)
Albumin: 3.6 g/dL (ref 3.5–5.0)
BUN: 32 mg/dL — ABNORMAL HIGH (ref 6–20)
CALCIUM: 8.8 mg/dL — AB (ref 8.9–10.3)
CO2: 19 mmol/L — AB (ref 22–32)
Chloride: 110 mmol/L (ref 101–111)
Creatinine, Ser: 2.67 mg/dL — ABNORMAL HIGH (ref 0.61–1.24)
GFR calc non Af Amer: 25 mL/min — ABNORMAL LOW (ref >60)
GFR, EST AFRICAN AMERICAN: 29 mL/min — AB (ref >60)
Glucose, Bld: 263 mg/dL — ABNORMAL HIGH (ref 65–99)
Potassium: 4.2 mmol/L (ref 3.5–5.1)
SODIUM: 137 mmol/L (ref 135–145)
TOTAL PROTEIN: 6.7 g/dL (ref 6.5–8.1)
Total Bilirubin: 0.5 mg/dL (ref 0.3–1.2)

## 2015-05-08 LAB — CBC
HCT: 32 % — ABNORMAL LOW (ref 39.0–52.0)
HEMATOCRIT: 33.5 % — AB (ref 39.0–52.0)
HEMOGLOBIN: 11 g/dL — AB (ref 13.0–17.0)
Hemoglobin: 11.5 g/dL — ABNORMAL LOW (ref 13.0–17.0)
MCH: 29 pg (ref 26.0–34.0)
MCH: 28.6 pg (ref 26.0–34.0)
MCHC: 34.3 g/dL (ref 30.0–36.0)
MCHC: 34.4 g/dL (ref 30.0–36.0)
MCV: 84.4 fL (ref 78.0–100.0)
MCV: 83.3 fL (ref 78.0–100.0)
Platelets: 178 10*3/uL (ref 150–400)
Platelets: 176 10*3/uL (ref 150–400)
RBC: 3.97 MIL/uL — ABNORMAL LOW (ref 4.22–5.81)
RBC: 3.84 MIL/uL — AB (ref 4.22–5.81)
RDW: 13.1 % (ref 11.5–15.5)
RDW: 13.2 % (ref 11.5–15.5)
WBC: 6.7 10*3/uL (ref 4.0–10.5)
WBC: 5.6 10*3/uL (ref 4.0–10.5)

## 2015-05-08 LAB — SURGICAL PCR SCREEN
MRSA, PCR: NEGATIVE
Staphylococcus aureus: NEGATIVE

## 2015-05-08 LAB — RAPID URINE DRUG SCREEN, HOSP PERFORMED
Amphetamines: NOT DETECTED
BARBITURATES: NOT DETECTED
BENZODIAZEPINES: NOT DETECTED
COCAINE: NOT DETECTED
Opiates: POSITIVE — AB
TETRAHYDROCANNABINOL: NOT DETECTED

## 2015-05-08 LAB — I-STAT TROPONIN, ED: TROPONIN I, POC: 0 ng/mL (ref 0.00–0.08)

## 2015-05-08 LAB — TROPONIN I: Troponin I: <0.03 ng/mL (ref <0.031)

## 2015-05-08 LAB — BRAIN NATRIURETIC PEPTIDE: B NATRIURETIC PEPTIDE 5: 57.3 pg/mL (ref 0.0–100.0)

## 2015-05-08 LAB — PROTIME-INR
INR: 0.99 (ref 0.00–1.49)
Prothrombin Time: 13.3 seconds (ref 11.6–15.2)

## 2015-05-08 LAB — APTT: aPTT: 71 seconds — ABNORMAL HIGH (ref 24–37)

## 2015-05-08 MED ORDER — HYDRALAZINE HCL 20 MG/ML IJ SOLN: 5.0000 mg | INTRAMUSCULAR | Status: DC | PRN

## 2015-05-08 MED ORDER — FENOFIBRATE 160 MG PO TABS
160.0000 mg | ORAL_TABLET | Freq: Every day | ORAL | Status: DC
  Administered 2015-05-08: 160 mg via ORAL
  Filled 2015-05-08: qty 1

## 2015-05-08 MED ORDER — ONDANSETRON HCL 4 MG/2ML IJ SOLN: 4.0000 mg | Freq: Four times a day (QID) | INTRAMUSCULAR | Status: DC | PRN

## 2015-05-08 MED ORDER — HEPARIN (PORCINE) IN NACL 100-0.45 UNIT/ML-% IJ SOLN
1250.0000 [IU]/h | INTRAMUSCULAR | Status: DC
  Administered 2015-05-08: 1250 [IU]/h via INTRAVENOUS
  Filled 2015-05-08: qty 250

## 2015-05-08 MED ORDER — NITROGLYCERIN 0.4 MG SL SUBL
0.4000 mg | SUBLINGUAL_TABLET | SUBLINGUAL | Status: DC | PRN
  Administered 2015-05-08 (×2): 0.4 mg via SUBLINGUAL
  Filled 2015-05-08: qty 1

## 2015-05-08 MED ORDER — ATORVASTATIN CALCIUM 80 MG PO TABS
80.0000 mg | ORAL_TABLET | Freq: Every day | ORAL | Status: DC
  Administered 2015-05-08: 80 mg via ORAL
  Filled 2015-05-08 (×2): qty 1

## 2015-05-08 MED ORDER — CLOPIDOGREL BISULFATE 75 MG PO TABS
75.0000 mg | ORAL_TABLET | Freq: Every day | ORAL | Status: DC
  Administered 2015-05-08: 75 mg via ORAL
  Filled 2015-05-08: qty 1

## 2015-05-08 MED ORDER — MORPHINE SULFATE (PF) 2 MG/ML IV SOLN
2.0000 mg | INTRAVENOUS | Status: DC | PRN
  Administered 2015-05-08: 2 mg via INTRAVENOUS
  Filled 2015-05-08 (×3): qty 1

## 2015-05-08 MED ORDER — OMEGA-3-ACID ETHYL ESTERS 1 G PO CAPS
1.0000 g | ORAL_CAPSULE | Freq: Every day | ORAL | Status: DC
  Administered 2015-05-08: 1 g via ORAL
  Filled 2015-05-08: qty 1

## 2015-05-08 MED ORDER — ACETAMINOPHEN 325 MG PO TABS: 650.0000 mg | ORAL_TABLET | ORAL | Status: DC | PRN

## 2015-05-08 MED ORDER — ASPIRIN 325 MG PO TABS
325.0000 mg | ORAL_TABLET | Freq: Once | ORAL | Status: AC
  Administered 2015-05-08: 325 mg via ORAL
  Filled 2015-05-08: qty 1

## 2015-05-08 MED ORDER — ASPIRIN 325 MG PO TABS
325.0000 mg | ORAL_TABLET | Freq: Every day | ORAL | Status: DC
  Administered 2015-05-08: 325 mg via ORAL
  Filled 2015-05-08: qty 1

## 2015-05-08 MED ORDER — HEPARIN SODIUM (PORCINE) 5000 UNIT/ML IJ SOLN: 5000.0000 [IU] | Freq: Three times a day (TID) | INTRAMUSCULAR | Status: DC

## 2015-05-08 MED ORDER — INSULIN GLARGINE 100 UNIT/ML ~~LOC~~ SOLN
25.0000 [IU] | Freq: Two times a day (BID) | SUBCUTANEOUS | Status: DC
  Administered 2015-05-08: 25 [IU] via SUBCUTANEOUS
  Filled 2015-05-08 (×2): qty 0.25

## 2015-05-08 MED ORDER — INSULIN ASPART 100 UNIT/ML ~~LOC~~ SOLN
0.0000 [IU] | Freq: Three times a day (TID) | SUBCUTANEOUS | Status: DC
  Administered 2015-05-08: 3 [IU] via SUBCUTANEOUS
  Administered 2015-05-08: 7 [IU] via SUBCUTANEOUS
  Administered 2015-05-08: 5 [IU] via SUBCUTANEOUS

## 2015-05-08 MED ORDER — VITAMIN E 180 MG (400 UNIT) PO CAPS
400.0000 [IU] | ORAL_CAPSULE | Freq: Every day | ORAL | Status: DC
  Administered 2015-05-08: 400 [IU] via ORAL
  Filled 2015-05-08: qty 1

## 2015-05-08 MED ORDER — PRAVASTATIN SODIUM 40 MG PO TABS
40.0000 mg | ORAL_TABLET | Freq: Every evening | ORAL | Status: DC
  Administered 2015-05-08: 40 mg via ORAL
  Filled 2015-05-08: qty 1

## 2015-05-08 MED ORDER — GABAPENTIN 300 MG PO CAPS
300.0000 mg | ORAL_CAPSULE | Freq: Three times a day (TID) | ORAL | Status: DC
  Administered 2015-05-08 (×2): 300 mg via ORAL
  Filled 2015-05-08 (×2): qty 1

## 2015-05-08 MED ORDER — NITROGLYCERIN IN D5W 200-5 MCG/ML-% IV SOLN
2.0000 ug/min | INTRAVENOUS | Status: DC
  Administered 2015-05-08: 45 ug/min via INTRAVENOUS
  Administered 2015-05-08: 5 ug/min via INTRAVENOUS
  Filled 2015-05-08: qty 250

## 2015-05-08 MED ORDER — SODIUM CHLORIDE 0.9 % IJ SOLN
3.0000 mL | Freq: Two times a day (BID) | INTRAMUSCULAR | Status: DC
  Administered 2015-05-08: 3 mL via INTRAVENOUS

## 2015-05-08 MED ORDER — ONDANSETRON HCL 4 MG PO TABS: 4.0000 mg | ORAL_TABLET | Freq: Four times a day (QID) | ORAL | Status: DC | PRN

## 2015-05-08 MED ORDER — AMLODIPINE BESYLATE 10 MG PO TABS
10.0000 mg | ORAL_TABLET | Freq: Every day | ORAL | Status: DC
  Administered 2015-05-08: 10 mg via ORAL
  Filled 2015-05-08: qty 1

## 2015-05-08 MED ORDER — PANTOPRAZOLE SODIUM 40 MG PO TBEC
40.0000 mg | DELAYED_RELEASE_TABLET | Freq: Two times a day (BID) | ORAL | Status: DC
  Administered 2015-05-08: 40 mg via ORAL
  Filled 2015-05-08: qty 1

## 2015-05-08 MED ORDER — ISOSORBIDE MONONITRATE ER 60 MG PO TB24: 60.0000 mg | ORAL_TABLET | Freq: Two times a day (BID) | ORAL | Status: DC

## 2015-05-08 MED ORDER — CARVEDILOL 25 MG PO TABS
37.5000 mg | ORAL_TABLET | Freq: Two times a day (BID) | ORAL | Status: DC
  Administered 2015-05-08 (×2): 37.5 mg via ORAL
  Filled 2015-05-08 (×4): qty 1

## 2015-05-08 MED ORDER — HEPARIN BOLUS VIA INFUSION
4000.0000 [IU] | Freq: Once | INTRAVENOUS | Status: AC
  Administered 2015-05-08: 4000 [IU] via INTRAVENOUS
  Filled 2015-05-08: qty 4000

## 2015-05-08 MED ORDER — LOSARTAN POTASSIUM 50 MG PO TABS
100.0000 mg | ORAL_TABLET | Freq: Every day | ORAL | Status: DC
  Administered 2015-05-08: 100 mg via ORAL
  Filled 2015-05-08: qty 2

## 2015-05-08 MED ORDER — RANOLAZINE ER 500 MG PO TB12
500.0000 mg | ORAL_TABLET | Freq: Two times a day (BID) | ORAL | Status: DC
  Administered 2015-05-08: 500 mg via ORAL
  Filled 2015-05-08: qty 1

## 2015-05-08 MED ORDER — ZOLPIDEM TARTRATE 5 MG PO TABS: 10.0000 mg | ORAL_TABLET | Freq: Every day | ORAL | Status: DC

## 2015-05-08 NOTE — Progress Notes (Signed)
Pt seen and examined, admitted earlier this am 54/M with CAD, CABG, PCI/stent to RCA in 3/15, chronic stable angina, admitted with perisitent chest pain He also lifted fireplace thursday and Saturday before this started EKG with TW flattening in inferior and lateral leads Will cycle troponins, stop heparin Cards consulted Suspect Musculoskeletal component. Also has CKD 3-4, creatinine close to baseline 2.3  Domenic Polite, MD [phone]

## 2015-05-08 NOTE — ED Notes (Signed)
Dr Niu at bedside 

## 2015-05-08 NOTE — ED Notes (Signed)
Pt. reports central chest pain with SOB and dry cough onset yesterday , denies nausea or diaphoresis , history of CAD/CABG his cardiologist is Dr. Algernon Huxley .

## 2015-05-08 NOTE — H&P (Signed)
[facility] History and Physical  Matthew Rowe YCX:[id number] DOB: [birth year] DOA: 05/08/2015  Referring physician: ED physician PCP: Barrie Lyme, FNP  Specialists:   Chief Complaint: Chest pain  HPI: Matthew Rowe. is a 54 y.o. male with PMH of hypertension, diabetes mellitus, hyperlipidemia, GERD, gout, OSA on CPAP, PCKD, diastolic congestive heart failure, CAD, s/p of stent 1999, 2002, 2004 and 2015, s/p of CABG 2006, who presents with the chest pain.  Patient reports that he started having chest pain at about 9 PM. The chest pain is located in the left chest, 10 out of 10 in severity, constant, pressure-like pain, nonradiating. It is associated with mild shortness of breath, but no cough, fever or chills. Chest pain is not pleuritic. He took SL nitroglycerin twice without significant help. He does not have abdominal pain, diarrhea, symptoms of UTI, unilateral weakness.  In ED, patient was found to have an ER 0.99, negative troponin, CBC 6.7, temperature normal, no tachycardia, worsening renal function. Chest x-ray showed hypoventilation, but no infiltration. Patient is committed to inpatient for further evaluation and treatment.  Where does patient live?   At home    Can patient participate in ADLs?  Yes    Review of Systems:   General: no fevers, chills, no changes in body weight, has poor appetite, has fatigue HEENT: no blurry vision, hearing changes or sore throat Pulm: has dyspnea, no coughing, wheezing CV: has chest pain, no palpitations Abd: no nausea, vomiting, abdominal pain, diarrhea, constipation GU: no dysuria, burning on urination, increased urinary frequency, hematuria  Ext: has mild leg edema Neuro: no unilateral weakness, numbness, or tingling, no vision change or hearing loss Skin: no rash MSK: No muscle spasm, no deformity, no limitation of range of movement in spin Heme: No easy bruising.  Travel history: No recent long distant  travel.  Allergy:  Allergies  Allergen Reactions  . Ciprocin-Fluocin-Procin [Fluocinolone Acetonide] Rash  . Clarithromycin Itching and Other (See Comments)    "Biaxin" Eyes itch and burn  . Cleocin [Clindamycin Hcl] Anaphylaxis and Swelling  . Glimepiride [Amaryl] Other (See Comments)    Elevates liver function     Past Medical History  Diagnosis Date  . Dyslipidemia   . Coronary artery disease     a. INF MI 1999: PCI of RCA;  b.  extensive stenting of LAD;  c. s/p CABG with RIMA->RCA in 2006;  d. Stable, Low risk MV 05/2012; e. 09/2012 Cath: stable anatomy->med Rx. f. Abnl nuc 11/2013 -> med rx; g. 11/2013 Cath/PCI: LM 30d, LAD 50ost, 20/50isr, 100d, LCX 40-50ost, OM2 50ost, RI 70-80ost sup branch, RCA 20p, 90/60d(2.25x17 & 2.25x24 Promus DES'), PDA 60p, RIMA->RCA ok.  . Hypertensive heart disease   . Hx of echocardiogram     a. Echo 3/12: mild LVH, EF 55-60%, trivial AI  . Anginal pain   . Myocardial infarction 1999; 2002; 2003; 2006; ~ 2008  . OSA on CPAP   . DM2 (diabetes mellitus, type 2)   . GERD (gastroesophageal reflux disease)   . History of gout   . CKD (chronic kidney disease), stage III   . Polycystic kidney disease     Past Surgical History  Procedure Laterality Date  . Cystectomy  1990's    off face  . Left heart catheterization with coronary/graft angiogram N/A 09/16/2012    Procedure: LEFT HEART CATHETERIZATION WITH [name];  Surgeon: [name], MD;  Location: [facility] CATH LAB;  Service: Cardiovascular;  Laterality: N/A;  .  Left heart catheterization with coronary/graft angiogram N/A 12/02/2013    Procedure: LEFT HEART CATHETERIZATION WITH [name];  Surgeon: [name], MD;  Location: [facility] CATH LAB;  Service: Cardiovascular;  Laterality: N/A;  . Percutaneous coronary stent intervention (pci-s) N/A 12/03/2013    Procedure: PERCUTANEOUS CORONARY STENT INTERVENTION (PCI-S);  Surgeon: [name], MD;  Location: [facility] CATH LAB;   Service: Cardiovascular;  Laterality: N/A;  . Coronary artery bypass graft  2006    CABG X?; in [place]  . Coronary angioplasty with stent placement  1999 / 2002 / 2004 / 2006?  Marland Kitchen Cardiac catheterization  2012  . Cardiac catheterization  2013  . Cardiac catheterization  2014    LHC (1/14): total occlusion of distal LAD, diffuse disease in ramus, 70% proximal LCx, 50% proximal and mid RCA, 50% distal RCA, patent RIMA-RCA.    Social History:  reports that he has quit smoking. His smoking use included Cigarettes. He has never used smokeless tobacco. He reports that he does not drink alcohol or use illicit drugs.  Family History:  Family History  Problem Relation Age of Onset  . Lung cancer Mother   . Anemia Mother   . Polycystic kidney disease Mother   . Diabetes Father   . Heart attack Father 84    Died suddenly  . Heart failure Father   . Hyperlipidemia Father   . Hypertension Father   . Kidney failure Sister 5    Died  . Heart attack Sister   . Hypertension Sister   . Diabetes Sister   . Diabetes Brother   . Polycystic kidney disease Brother   . Diabetes Sister      Prior to Admission medications   Medication Sig Start Date End Date Taking? Authorizing Provider  ACCU-CHEK SOFTCLIX LANCETS lancets Use as instructed to check blood sugar 4 times per day dx code E.11.65 03/30/15   Darreld Mclean, MD  acetaminophen (TYLENOL) 325 MG tablet Take 2 tablets (650 mg total) by mouth every 4 (four) hours as needed for headache or mild pain. 01/02/15   Erlene Quan, PA-C  Alcohol Swabs PADS Use as instructed to check blood sugar 4 times per day dx code E.11.65 03/30/15   Gay Filler Copland, MD  amLODipine (NORVASC) 10 MG tablet Take 1 tablet (10 mg total) by mouth daily. 01/12/15   Darreld Mclean, MD  aspirin 81 MG tablet Take 1 tablet (81 mg total) by mouth daily. 12/04/13   Rogelia Mire, NP  B-D UF III MINI PEN NEEDLES 31G X 5 MM MISC USE ONE DAILY FOR INSULIN INJECTION 09/23/14    Darreld Mclean, MD  Blood Glucose Calibration (SURECHEK CONTROL SOLUTION) NORMAL LIQD Use as instructed to check blood sugar 4 times per day dx code E.11.65 03/30/15   Gay Filler Copland, MD  blood glucose meter kit and supplies KIT Use as instructed to check blood sugar 4 times per day dx code E.11.65 03/30/15   Gay Filler Copland, MD  carvedilol (COREG) 25 MG tablet TAKE 1 AND 1/2 TABLETS TWICE DAILY WITH MEALS 01/12/15   Darreld Mclean, MD  chlorpheniramine-HYDROcodone (TUSSIONEX PENNKINETIC ER) 10-8 MG/5ML SUER Take 5 mLs by mouth every 12 (twelve) hours as needed for cough. 02/15/15   Debby Freiberg, MD  clopidogrel (PLAVIX) 75 MG tablet Take 1 tablet (75 mg total) by mouth daily. With breakfast. PATIENT NEEDS OFFICE VISIT FOR ADDITIONAL REFILLS 03/31/15   Darreld Mclean, MD  colchicine 0.6 MG  tablet Take 2 pills today, then may take 1 pill a day for the next 2-3 days if needed for gout 11/24/14   Gay Filler Copland, MD  fenofibrate (TRICOR) 145 MG tablet Take 1 tablet (145 mg total) by mouth daily. 11/08/14   Larey Dresser, MD  gabapentin (NEURONTIN) 300 MG capsule Take 1 capsule (300 mg total) by mouth 3 (three) times daily. PATIENT NEEDS OFFICE VISIT FOR ADDITIONAL REFILLS 03/31/15   Darreld Mclean, MD  glucose blood (ACCU-CHEK AVIVA PLUS) test strip Use as instructed to check blood sugar 4 times per day dx code E11.65 03/30/15   Darreld Mclean, MD  hydrochlorothiazide (HYDRODIURIL) 25 MG tablet  02/02/15   Historical Provider, MD  Insulin Glargine (LANTUS SOLOSTAR) 100 UNIT/ML Solostar Pen INJECT 20-30 UNITS INTO THE SKIN 2 (TWO) TIMES DAILY. USUALLY TAKES 20 UNITS, IF BLOOD SUGARS ARE ELEVATED CAN TAKE UP TO 30 UNITS.  "OV NEEDED" 03/23/15   Jaynee Eagles, PA-C  insulin NPH-regular Human (NOVOLIN 70/30 RELION) (70-30) 100 UNIT/ML injection Inject 15 Units into the skin 3 (three) times daily. 08/10/14   Elayne Snare, MD  Insulin Syringe-Needle U-100 (INSULIN SYRINGE .5CC/31GX5/16") 31G X 5/16" 0.5  ML MISC Use one needle three times per day 05/26/14   Elayne Snare, MD  isosorbide mononitrate (IMDUR) 60 MG 24 hr tablet Take 1 tablet (60 mg total) by mouth 2 (two) times daily. 02/03/15   Larey Dresser, MD  losartan (COZAAR) 100 MG tablet Take 1 tablet (100 mg total) by mouth daily. PATIENT NEEDS OFFICE VISIT FOR ADDITIONAL REFILLS 03/31/15   Darreld Mclean, MD  Multiple Vitamin (MULTIVITAMIN WITH MINERALS) TABS tablet Take 1 tablet by mouth daily.    Historical Provider, MD  nitroGLYCERIN (NITROSTAT) 0.4 MG SL tablet Place 1 tablet (0.4 mg total) under the tongue every 5 (five) minutes as needed for chest pain. 11/03/14   Larey Dresser, MD  Omega-3 Fatty Acids (FISH OIL) 1200 MG CAPS Take 1 capsule (1,200 mg total) by mouth 2 (two) times daily. 02/03/15   Larey Dresser, MD  omeprazole (PRILOSEC) 20 MG capsule Take 1 capsule (20 mg total) by mouth daily as needed (indigestion). 01/02/15   Erlene Quan, PA-C  pravastatin (PRAVACHOL) 40 MG tablet TAKE 1 TABLET EVERY EVENING 12/20/14   Darreld Mclean, MD  ranolazine (RANEXA) 1000 MG SR tablet Take 1 tablet (1,000 mg total) by mouth 2 (two) times daily. 09/19/14   Rogelia Mire, NP  zolpidem (AMBIEN) 10 MG tablet TAKE 1 TABLET AT BEDTIME 01/05/15   Darreld Mclean, MD    Physical Exam: Filed Vitals:   05/08/15 0343 05/08/15 0349 05/08/15 0355 05/08/15 0357  BP: 126/56 115/56 110/53 126/74  Pulse: 72 73 72 72  Temp:      TempSrc:      Resp: _0 SpO2: 97% 96% 97% 97%   General: Not in acute distress HEENT:       Eyes: PERRL, EOMI, no scleral icterus.       ENT: No discharge from the ears and nose, no pharynx injection, no tonsillar enlargement.        Neck: No JVD, no bruit, no mass felt. Heme: No neck lymph node enlargement. Cardiac: S1/S2, RRR, No murmurs, No gallops or rubs. Pulm: No rales, wheezing, rhonchi or rubs. Abd: Soft, nondistended, nontender, no rebound pain, no organomegaly, BS present. Ext: Trace leg  edema bilaterally. 2+DP/PT pulse bilaterally. Musculoskeletal: No joint deformities, No  joint redness or warmth, no limitation of ROM in spin. Skin: No rashes.  Neuro: Alert, oriented X3, cranial nerves II-XII grossly intact, muscle strength 5/5 in all extremities, sensation to light touch intact. Psych: Patient is not psychotic, no suicidal or hemocidal ideation.  Labs on Admission:  Basic Metabolic Panel:  Recent Labs Lab 05/08/15 0238  NA 133*  K 4.4  CL 107  CO2 18*  GLUCOSE 364*  BUN 31*  CREATININE 2.53*  CALCIUM 8.9   Liver Function Tests: No results for input(s): AST, ALT, ALKPHOS, BILITOT, PROT, ALBUMIN in the last 168 hours. No results for input(s): LIPASE, AMYLASE in the last 168 hours. No results for input(s): AMMONIA in the last 168 hours. CBC:  Recent Labs Lab 05/08/15 0238  WBC 6.7  HGB 11.5*  HCT 33.5*  MCV 84.4  PLT 178   Cardiac Enzymes: No results for input(s): CKTOTAL, CKMB, CKMBINDEX, TROPONINI in the last 168 hours.  BNP (last 3 results)  Recent Labs  09/18/14 0220 12/31/14 2251 02/15/15 1948  BNP 120.0* 127.0* 66.9    ProBNP (last 3 results) No results for input(s): PROBNP in the last 8760 hours.  CBG: No results for input(s): GLUCAP in the last 168 hours.  Radiological Exams on Admission: Dg Chest 2 View  05/08/2015   CLINICAL DATA:  Left-sided chest pain and shortness of breath for 2 days.  EXAM: CHEST  2 VIEW  COMPARISON:  02/15/2015  FINDINGS: Patient is post median sternotomy. Lung volumes are low. The cardiomediastinal contours are normal for technique. No consolidation, pleural effusion, or pneumothorax. No acute osseous abnormalities are seen.  IMPRESSION: Hypoventilatory chest without evidence of acute process.   Electronically Signed   By: Jeb Levering M.D.   On: 05/08/2015 02:34    EKG: Independently reviewed.  Abnormal findings: Poor R-wave progression, low voltage, T-wave flattening   Assessment/Plan Principal  Problem:   Chest pain Active Problems:   Dyslipidemia   HTN (hypertension)   Acute renal failure superimposed on stage 3 chronic kidney disease   Polycystic kidney disease   OSA on CPAP   Obesity-BMI 42   Type 2 diabetes mellitus with renal manifestations   CAD- CABG 2006, RCA DES March 2015   GERD (gastroesophageal reflux disease)   Gout   Chronic diastolic CHF (congestive heart failure)  Chest pain and CAD: No pneumonia on the chest x-ray, no signs of DVT, less likely to have PE. Given his significant history of CAD and persistent severe chest pain, ACS is the potential differential diagnosis.  - will admit to SDU - start Nitro gtt - Start Heparin gtt until trop negative for 3 times. - cycle CE q6 x3 and repeat her EKG in the am  - prn Morphine, coreg and aspirin, plavix, Imdur, Ranexa - switch pravastatin to hight dose Lipitor - Consider cardiology consult if test positive for CEs  - 2d echo  HLD: Last LDL was 72  and TG=526 on 11/03/14. - Fenofibrate -switch pravastatin to Lipitor as above  HTN (hypertension): -Amlodipine, Coreg -hold HCTZ due to worsening renal function -Hydralazine IV when necessary  AoCKD-III: Baseline Cre is 1.9-2.3, his Cre is 2.53 on admission. Likely due to prerenal secondary to dehydration and continuation of diruetics - Check FeUrea - US-renal - Follow up renal function by BMP - Hold Diuretics, HCTZ  OSA: -CPAP  DM-II: Last A1c 10.2, poorly controled. Patient is taking NPH 70/30 insulin at home, sometimes he also takes Lantus 20-30 units twice a day -Lantus  25 units bid -SSI  GERD: -Protonix  Gout: stable. Not taking med currently -Observe closely  Chronic diastolic CHF (congestive heart failure): 2-D echo on 01/02/15 showed EF was 50-50% of his crit went diastolic dysfunction. Patient is only taking hydrochlorothiazide at home. CHF is compensated, he has trace amount of leg edema. -Hold HCTZ due to worsening renal function -Check  BNP   DVT ppx: SQ Heparin  Code Status: Full code Family Communication:  Yes, patient's wife at bed side Disposition Plan: Admit to inpatient   Date of Service 05/08/2015    Ivor Costa [facility] Pager [phone]  If 7PM-7AM, please contact night-coverage [email or web address] Password [email or web address] 05/08/2015, 4:09 AM

## 2015-05-08 NOTE — ED Notes (Addendum)
Pt wears c pap at night.

## 2015-05-08 NOTE — Progress Notes (Signed)
ANTICOAGULATION CONSULT NOTE - Initial Consult  Pharmacy Consult for heparin Indication: chest pain/ACS  Allergies  Allergen Reactions  . Ciprocin-Fluocin-Procin [Fluocinolone Acetonide] Rash  . Clarithromycin Itching and Other (See Comments)    "Biaxin" Eyes itch and burn  . Cleocin [Clindamycin Hcl] Anaphylaxis and Swelling  . Glimepiride [Amaryl] Other (See Comments)    Elevates liver function     Patient Measurements: Height: 5\' 5"  (165.1 cm) Weight: 265 lb (120.203 kg) IBW/kg (Calculated) : 61.5 Heparin Dosing Weight: 90kg  Vital Signs: Temp: 98.2 F (36.8 C) (08/28 0205) Temp Source: Oral (08/28 0205) BP: 138/80 mmHg (08/28 0420) Pulse Rate: 72 (08/28 0420)  Labs:  Recent Labs  05/08/15 0238  HGB 11.5*  HCT 33.5*  PLT 178  LABPROT 13.3  INR 0.99  CREATININE 2.53*    Estimated Creatinine Clearance: 39.7 mL/min (by C-G formula based on Cr of 2.53).   Medical History: Past Medical History  Diagnosis Date  . Dyslipidemia   . Coronary artery disease     a. INF MI 1999: PCI of RCA;  b.  extensive stenting of LAD;  c. s/p CABG with RIMA->RCA in 2006;  d. Stable, Low risk MV 05/2012; e. 09/2012 Cath: stable anatomy->med Rx. f. Abnl nuc 11/2013 -> med rx; g. 11/2013 Cath/PCI: LM 30d, LAD 50ost, 20/50isr, 100d, LCX 40-50ost, OM2 50ost, RI 70-80ost sup branch, RCA 20p, 90/60d(2.25x17 & 2.25x24 Promus DES'), PDA 60p, RIMA->RCA ok.  . Hypertensive heart disease   . Hx of echocardiogram     a. Echo 3/12: mild LVH, EF 55-60%, trivial AI  . Anginal pain   . Myocardial infarction 1999; 2002; 2003; 2006; ~ 2008  . OSA on CPAP   . DM2 (diabetes mellitus, type 2)   . GERD (gastroesophageal reflux disease)   . History of gout   . CKD (chronic kidney disease), stage III   . Polycystic kidney disease       Assessment: 54yo male w/ cardiac PMH c/o central CP associated w/ SOB and cough, initial troponin negative, to begin heparin.  Goal of Therapy:  Heparin level  0.3-0.7 units/ml Monitor platelets by anticoagulation protocol: Yes   Plan:  Will give heparin 4000 units IV bolus x1 followed by gtt at 1250 units/hr and monitor heparin levels and CBC.  Wynona Neat, PharmD, BCPS  05/08/2015,4:24 AM

## 2015-05-08 NOTE — Consult Note (Signed)
Admit date: 05/08/2015 Referring Physician  Dr. Blaine Hamper Primary Physician  Dr. Gwenlyn Perking Primary Cardiologist  [name], MD Reason for Consultation  Chest pain  HPI: Matthew Rowe. is a 54 y.o. male with PMH of hypertension, diabetes mellitus, hyperlipidemia, GERD, gout, OSA on CPAP, PCKD, diastolic congestive heart failure.  He has a history of CAD s/p CABG (RIMA to RCA, anomalous RCA off left cusp), occlusion of the distal LAD, and chronic angina. Patient has had chronic angina for years. In 1/14, he developed progressive angina and was hospitalized. LHC was done, showing unchanged anatomy. RIMA-RCA was patent. He did well after this on ranolazine. In 4/14, he ran out of ranolazine and the chest pain returned. He was admitted overnight in 4/14 and ruled out for MI. In 7/14, he had again run low on ranolazine and developed more chest pain. He was admitted again and ruled out for MI. In 3/15, he was admitted with recurrent chest pain. LHC was done, showing 90% distal RCA stenosis after RIMA insertion. He had Promus DES x 2 to distal RCA. He ran out of amlodipine and losartan in 1/16 and developed chest pain in the setting of elevated BP. He was admitted and ruled out for MI. Chest pain resolved with BP control. He was admitted again in 4/16 with a hypertensive urgency after running out of his meds. Last echo in 4/16 showed EF 50-55% and mild AS.     Patient reports that he started having chest pain at about 9 PM last night. The chest pain was located in the left chest, 10 out of 10 in severity, constant, pressure-like pain, nonradiating. It was associated with mild shortness of breath, but no cough, fever or chills. Chest pain was not pleuritic. He took SL nitroglycerin twice without significant help.  In ED, patient was found to have an DD 0.99, negative troponin, Chest x-ray showed hypoventilation, but no infiltration. Cardiology is now asked to consult for further evaluation of  CP.  He states that this pain is different from his chronic angina in that it is sharp and usually he gets sharp pain when his BS is elevated.  He also has been working on and moving a Art therapist since Wed and the pain started on Friday.  It is worse everytime he moves or lifts anything heavy.  He denies any nausea or diaphoresis.     PMH:   Past Medical History  Diagnosis Date  . Dyslipidemia   . Coronary artery disease     a. INF MI 1999: PCI of RCA;  b.  extensive stenting of LAD;  c. s/p CABG with RIMA->RCA in 2006;  d. Stable, Low risk MV 05/2012; e. 09/2012 Cath: stable anatomy->med Rx. f. Abnl nuc 11/2013 -> med rx; g. 11/2013 Cath/PCI: LM 30d, LAD 50ost, 20/50isr, 100d, LCX 40-50ost, OM2 50ost, RI 70-80ost sup branch, RCA 20p, 90/60d(2.25x17 & 2.25x24 Promus DES'), PDA 60p, RIMA->RCA ok.  . Hypertensive heart disease   . Hx of echocardiogram     a. Echo 3/12: mild LVH, EF 55-60%, trivial AI  . Anginal pain   . Myocardial infarction 1999; 2002; 2003; 2006; ~ 2008  . OSA on CPAP   . DM2 (diabetes mellitus, type 2)   . GERD (gastroesophageal reflux disease)   . History of gout   . CKD (chronic kidney disease), stage III   . Polycystic kidney disease      PSH:   Past Surgical History  Procedure Laterality  Date  . Cystectomy  1990's    off face  . Left heart catheterization with coronary/graft angiogram N/A 09/16/2012    Procedure: LEFT HEART CATHETERIZATION WITH [name];  Surgeon: [name], MD;  Location: [facility] CATH LAB;  Service: Cardiovascular;  Laterality: N/A;  . Left heart catheterization with coronary/graft angiogram N/A 12/02/2013    Procedure: LEFT HEART CATHETERIZATION WITH [name];  Surgeon: [name], MD;  Location: [facility] CATH LAB;  Service: Cardiovascular;  Laterality: N/A;  . Percutaneous coronary stent intervention (pci-s) N/A 12/03/2013    Procedure: PERCUTANEOUS CORONARY STENT INTERVENTION (PCI-S);  Surgeon: [name], MD;  Location: [facility] CATH LAB;  Service: Cardiovascular;  Laterality: N/A;  . Coronary artery bypass graft  2006    CABG X?; in [place]  . Coronary angioplasty with stent placement  1999 / 2002 / 2004 / 2006?  Marland Kitchen Cardiac catheterization  2012  . Cardiac catheterization  2013  . Cardiac catheterization  2014    LHC (1/14): total occlusion of distal LAD, diffuse disease in ramus, 70% proximal LCx, 50% proximal and mid RCA, 50% distal RCA, patent RIMA-RCA.    Allergies:  Ciprocin-fluocin-procin; Clarithromycin; Cleocin; and Glimepiride Prior to Admit Meds:   Prescriptions prior to admission  Medication Sig Dispense Refill Last Dose  . acetaminophen (TYLENOL) 325 MG tablet Take 2 tablets (650 mg total) by mouth every 4 (four) hours as needed for headache or mild pain.   Past Month at Unknown time  . amLODipine (NORVASC) 10 MG tablet Take 1 tablet (10 mg total) by mouth daily. 90 tablet 3 05/07/2015 at Unknown time  . aspirin 81 MG tablet Take 1 tablet (81 mg total) by mouth daily.   05/07/2015 at Unknown time  . carvedilol (COREG) 25 MG tablet TAKE 1 AND 1/2 TABLETS TWICE DAILY WITH MEALS 270 tablet 3 05/07/2015 at 2145  . clopidogrel (PLAVIX) 75 MG tablet Take 1 tablet (75 mg total) by mouth daily. With breakfast. PATIENT NEEDS OFFICE VISIT FOR ADDITIONAL REFILLS 90 tablet 0 05/07/2015 at Unknown time  . gabapentin (NEURONTIN) 300 MG capsule Take 1 capsule (300 mg total) by mouth 3 (three) times daily. PATIENT NEEDS OFFICE VISIT FOR ADDITIONAL REFILLS 270 capsule 0 05/07/2015 at Unknown time  . hydrochlorothiazide (HYDRODIURIL) 25 MG tablet Take 25 mg by mouth daily.    05/07/2015 at Unknown time  . Insulin Glargine (LANTUS SOLOSTAR) 100 UNIT/ML Solostar Pen INJECT 20-30 UNITS INTO THE SKIN 2 (TWO) TIMES DAILY. USUALLY TAKES 20 UNITS, IF BLOOD SUGARS ARE ELEVATED CAN TAKE UP TO 30 UNITS.  "OV NEEDED" 45 mL 0 Past Month at Unknown time  . insulin NPH-regular Human (NOVOLIN 70/30 RELION) (70-30) 100  UNIT/ML injection Inject 15 Units into the skin 3 (three) times daily. 20 mL 2 05/07/2015 at Unknown time  . isosorbide mononitrate (IMDUR) 60 MG 24 hr tablet Take 1 tablet (60 mg total) by mouth 2 (two) times daily. 90 tablet 3 05/07/2015 at Unknown time  . losartan (COZAAR) 100 MG tablet Take 1 tablet (100 mg total) by mouth daily. PATIENT NEEDS OFFICE VISIT FOR ADDITIONAL REFILLS 90 tablet 0 05/07/2015 at Unknown time  . Multiple Vitamin (MULTIVITAMIN WITH MINERALS) TABS tablet Take 1 tablet by mouth daily.   05/07/2015 at Unknown time  . nitroGLYCERIN (NITROSTAT) 0.4 MG SL tablet Place 1 tablet (0.4 mg total) under the tongue every 5 (five) minutes as needed for chest pain. 25 tablet 11 05/08/2015 at Unknown time  . Omega-3 Fatty Acids (  FISH OIL) 1200 MG CAPS Take 1 capsule (1,200 mg total) by mouth 2 (two) times daily.   05/07/2015 at Unknown time  . pantoprazole (PROTONIX) 40 MG tablet Take 40 mg by mouth 2 (two) times daily.   05/07/2015 at Unknown time  . pravastatin (PRAVACHOL) 40 MG tablet TAKE 1 TABLET EVERY EVENING 90 tablet 1 05/07/2015 at Unknown time  . ranolazine (RANEXA) 500 MG 12 hr tablet Take 500 mg by mouth 2 (two) times daily.   05/07/2015 at Unknown time  . vitamin E 400 UNIT capsule Take 400 Units by mouth daily.   05/07/2015 at Unknown time  . zolpidem (AMBIEN) 10 MG tablet TAKE 1 TABLET AT BEDTIME 90 tablet 0 05/07/2015 at Unknown time  . ACCU-CHEK SOFTCLIX LANCETS lancets Use as instructed to check blood sugar 4 times per day dx code E.11.65 400 each 2 unknown  . Alcohol Swabs PADS Use as instructed to check blood sugar 4 times per day dx code E.11.65 400 each 2 unknown  . B-D UF III MINI PEN NEEDLES 31G X 5 MM MISC USE ONE DAILY FOR INSULIN INJECTION 90 each 1 unknown  . Blood Glucose Calibration (SURECHEK CONTROL SOLUTION) NORMAL LIQD Use as instructed to check blood sugar 4 times per day dx code E.11.65 1 each 2 unknown  . blood glucose meter kit and supplies KIT Use as instructed  to check blood sugar 4 times per day dx code E.11.65 1 each 0 unknown  . chlorpheniramine-HYDROcodone (TUSSIONEX PENNKINETIC ER) 10-8 MG/5ML SUER Take 5 mLs by mouth every 12 (twelve) hours as needed for cough. (Patient not taking: Reported on 05/08/2015) 140 mL 0 Not Taking at Unknown time  . colchicine 0.6 MG tablet Take 2 pills today, then may take 1 pill a day for the next 2-3 days if needed for gout (Patient not taking: Reported on 05/08/2015) 30 tablet 0 Not Taking at Unknown time  . fenofibrate (TRICOR) 145 MG tablet Take 1 tablet (145 mg total) by mouth daily. (Patient not taking: Reported on 05/08/2015) 90 tablet 3 Not Taking at Unknown time  . glucose blood (ACCU-CHEK AVIVA PLUS) test strip Use as instructed to check blood sugar 4 times per day dx code E11.65 400 each 2 unknown  . Insulin Syringe-Needle U-100 (INSULIN SYRINGE .5CC/31GX5/16") 31G X 5/16" 0.5 ML MISC Use one needle three times per day 100 each 3 unknown  . omeprazole (PRILOSEC) 20 MG capsule Take 1 capsule (20 mg total) by mouth daily as needed (indigestion). (Patient not taking: Reported on 05/08/2015)   Not Taking at Unknown time   Fam HX:    Family History  Problem Relation Age of Onset  . Lung cancer Mother   . Anemia Mother   . Polycystic kidney disease Mother   . Diabetes Father   . Heart attack Father 33    Died suddenly  . Heart failure Father   . Hyperlipidemia Father   . Hypertension Father   . Kidney failure Sister 5    Died  . Heart attack Sister   . Hypertension Sister   . Diabetes Sister   . Diabetes Brother   . Polycystic kidney disease Brother   . Diabetes Sister    Social HX:    Social History   Social History  . Marital Status: Married    Spouse Name: [name]  . Number of Children: 5  . Years of Education: N/A   Occupational History  . retired     Chartered loss adjuster  Social History Main Topics  . Smoking status: Former Smoker    Types: Cigarettes  . Smokeless tobacco: Never Used      Comment: smoked some as a teenager (high school)  . Alcohol Use: No     Comment: 09/15/2012 "drank a little when I was young"  . Drug Use: No  . Sexual Activity: Yes   Other Topics Concern  . Not on file   Social History Narrative   Married and lives with wife in [place]. On disability.    Consumes about 2 Coke Zeros a day      ROS:  All 11 ROS were addressed and are negative except what is stated in the HPI  Physical Exam: Blood pressure 125/75, pulse 64, temperature 98 F (36.7 C), temperature source Oral, resp. rate 21, height 5' 5"  (1.651 m), weight 261 lb 11 oz (118.7 kg), SpO2 94 %.    General: Well developed, well nourished, in no acute distress Head: Eyes PERRLA, No xanthomas.   Normal cephalic and atramatic  Lungs:   Clear bilaterally to auscultation and percussion. Heart:   HRRR S1 S2 Pulses are 2+ & equal. Abdomen: Bowel sounds are positive, abdomen soft and non-tender without masses  Extremities:   No clubbing, cyanosis or edema.  DP +1 Neuro: Alert and oriented X 3. Psych:  Good affect, responds appropriately    Labs:   Lab Results  Component Value Date   WBC 5.6 05/08/2015   HGB 11.0* 05/08/2015   HCT 32.0* 05/08/2015   MCV 83.3 05/08/2015   PLT 176 05/08/2015    Recent Labs Lab 05/08/15 0835  NA 137  K 4.2  CL 110  CO2 19*  BUN 32*  CREATININE 2.67*  CALCIUM 8.8*  PROT 6.7  BILITOT 0.5  ALKPHOS 82  ALT 17  AST 22  GLUCOSE 263*   No results found for: PTT Lab Results  Component Value Date   INR 0.99 05/08/2015   INR 0.95 01/01/2015   INR 0.94 12/31/2014   Lab Results  Component Value Date   CKTOTAL 119 11/11/2011   CKMB 2.2 11/11/2011   TROPONINI <0.03 05/08/2015     Lab Results  Component Value Date   CHOL 217* 01/01/2015   CHOL 197 11/03/2014   CHOL 243* 09/19/2014   Lab Results  Component Value Date   HDL 27* 01/01/2015   HDL 33.00* 11/03/2014   HDL 30* 09/19/2014   Lab Results  Component Value Date   LDLCALC UNABLE TO  CALCULATE IF TRIGLYCERIDE OVER 400 mg/dL 01/01/2015   LDLCALC UNABLE TO CALCULATE IF TRIGLYCERIDE OVER 400 mg/dL 09/19/2014   LDLCALC UNABLE TO CALCULATE IF TRIGLYCERIDE OVER 400 mg/dL 11/27/2013   Lab Results  Component Value Date   TRIG 526* 01/01/2015   TRIG * 11/03/2014    511.0 Triglyceride is over 400; calculations on Lipids are invalid.   TRIG 524* 09/19/2014   Lab Results  Component Value Date   CHOLHDL 8.0 01/01/2015   CHOLHDL 6 11/03/2014   CHOLHDL 8.1 09/19/2014   Lab Results  Component Value Date   LDLDIRECT 72.0 11/03/2014   LDLDIRECT 79.8 08/12/2014   LDLDIRECT 83.2 07/08/2014      Radiology:  Dg Chest 2 View  05/08/2015   CLINICAL DATA:  Left-sided chest pain and shortness of breath for 2 days.  EXAM: CHEST  2 VIEW  COMPARISON:  02/15/2015  FINDINGS: Patient is post median sternotomy. Lung volumes are low. The cardiomediastinal contours are normal for technique. No  consolidation, pleural effusion, or pneumothorax. No acute osseous abnormalities are seen.  IMPRESSION: Hypoventilatory chest without evidence of acute process.   Electronically Signed   By: Jeb Levering M.D.   On: 05/08/2015 02:34    EKG:  NSR, LAD, LVH, inferior and anterior infarct and nonspecific T wave abnormality  Assessment/Plan Principal Problem:  Chest pain Active Problems:  Dyslipidemia  HTN (hypertension)  Acute renal failure superimposed on stage 3 chronic kidney disease  Polycystic kidney disease  OSA on CPAP  Obesity-BMI 42  Type 2 diabetes mellitus with renal manifestations  CAD- CABG 2006, RCA DES March 2015  GERD (gastroesophageal reflux disease)  Gout  Chronic diastolic CHF (congestive heart failure)   1.  Chest pain with negative troponin x 2.  EKG with some subtle T wave flattening compared to EKG 02/2015.  CP is very atypical and different from his typical angina.  This pain is sharp and worse with moving things.  I suspect that the frequent moving of his  heavy electric fireplace has resulted in musculoskeletal pain.  It was not responsive to NTG and is worse with lifting.  I can recreate some of the pain with palpation of left chest wall.  He says that usually the only time he gets sharp pain is when his BS is elevated and he ran out of his insulin which is on back order.  Would cycle cardiac enzymes and if remain negative no further inpt w/u and early followup with Dr. Aundra Dubin.  Wean NTG gtt off and ambulate in hall to see if he has recurrent CP. 2.  HTN - controlled on amlodipine and coreg 3.  Dyslipidemia - continue fenofibrate and statin.  Would keep on pravastatin and not switch to Lipitor 4.  ASCAD with remote CABG 2006 and RCA DES 11/2013 - continue ASA/plavix 5.  Chronic angina stable as long as he stays on his meds.  Exacerbations usually due to running out of meds.  Continue statin/long acting nitrates/amlodipine/ranolazine and Coreg. 6.  Type 2 DM - per IM 7.  Chronic diastolic CHF - appears euvolemic on exam 8.  Acute on CKD stage 3 (baseline creatinine 1.9-2.3) - felt secondary to prerenal secondary to diuretics.  Diuretics on hold.    Sueanne Margarita, MD  05/08/2015  10:47 AM

## 2015-05-08 NOTE — Evaluation (Signed)
Physical Therapy Evaluation Patient Details Name: Matthew Rowe Sr. MRN: [id number] DOB: 05-Apr-1961 Today's Date: 05/08/2015   History of Present Illness  HPI: [name]. is a 54 y.o. male with PMH of hypertension, diabetes mellitus, hyperlipidemia, GERD, gout, OSA on CPAP, PCKD, diastolic congestive heart failure, CAD, s/p of stent 1999, 2002, 2004 and 2015, s/p of CABG 2006, who presents with the chest pain.  Clinical Impression  Patient evaluated by Physical Therapy with no further acute PT needs identified. All education has been completed and the patient has no further questions.  . PT is signing off. Thank you for this referral.  Prior to amb: BP 129/70, HR 70; After amb: BP 125/74 HR 76; Walking did not reproduce chest pain symptoms.     Follow Up Recommendations No PT follow up    Equipment Recommendations  None recommended by PT    Recommendations for Other Services       Precautions / Restrictions Precautions Precautions: None      Mobility  Bed Mobility Overal bed mobility: Independent                Transfers Overall transfer level: Independent                  Ambulation/Gait Ambulation/Gait assistance: Supervision;Modified independent (Device/Increase time) Ambulation Distance (Feet): 280 Feet Assistive device:  (Pushing IV pole) Gait Pattern/deviations: Step-through pattern   Gait velocity interpretation: Below normal speed for age/gender General Gait Details: Slow, but steady; Wide step witdth with incr lateral translation of center of mass leading to decr gait efficiency; no chest pain symptoms reported  Stairs            Wheelchair Mobility    Modified Rankin (Stroke Patients Only)       Balance                                             Pertinent Vitals/Pain Pain Assessment: No/denies pain    Home Living Family/patient expects to be discharged to:: Private residence Living  Arrangements: Spouse/significant other Available Help at Discharge: Family;Available PRN/intermittently Type of Home: House Home Access: Stairs to enter   [employer] of Steps: 2 Home Layout: One level Home Equipment: None      Prior Function Level of Independence: Independent               Hand Dominance        Extremity/Trunk Assessment   Upper Extremity Assessment: Overall WFL for tasks assessed           Lower Extremity Assessment: Overall WFL for tasks assessed         Communication   Communication: No difficulties  Cognition Arousal/Alertness: Awake/alert Behavior During Therapy: WFL for tasks assessed/performed Overall Cognitive Status: Within Functional Limits for tasks assessed                      General Comments General comments (skin integrity, edema, etc.): Prior to amb: BP 129/70, HR 70; After amb: BP 125/74 HR 76    Exercises        Assessment/Plan    PT Assessment Patent does not need any further PT services  PT Diagnosis Acute pain   PT Problem List    PT Treatment Interventions     PT Goals (Current goals can be found in the  Care Plan section) Acute Rehab PT Goals Patient Stated Goal: Did not state PT Goal Formulation: All assessment and education complete, DC therapy    Frequency     Barriers to discharge        Co-evaluation               End of Session   Activity Tolerance: Patient tolerated treatment well Patient left: in chair;with call bell/phone within reach Nurse Communication: Mobility status         Time: 1230-1250 PT Time Calculation (min) (ACUTE ONLY): 20 min   Charges:   PT Evaluation $Initial PT Evaluation Tier I: 1 Procedure     PT G CodesRoney Marion Hamff 05/08/2015, 4:03 PM  Roney Marion, PT  Acute Rehabilitation Services Pager [phone] Office [phone]

## 2015-05-08 NOTE — ED Provider Notes (Signed)
CSN: [id number]     Arrival date & time 05/08/15  0153 History  This chart was scribed for Everlene Balls, MD by Evelene Croon, ED Scribe. This patient was seen in room B18C/B18C and the patient's care was started 2:31 AM.    Chief Complaint  Patient presents with  . Chest Pain   The history is provided by the patient. No language interpreter was used.     HPI Comments:  Matthew Aultman. is a 54 y.o. male with a history of CAD, MI, CABG who presents to the Emergency Department complaining of sharp, gradual onset, central sided CP onset ~ 1400 yesterday; states he was standing at time of onset. He notes he has been lifting heavy objects for the last 2 days and the lifting exacerbates his pain. He states his pain today is "close" to his pain felt with his MI. He reports mild associated SOB. Pt took 1 81 mg baby ASA PTA  Cardio: [place]  Past Medical History  Diagnosis Date  . Dyslipidemia   . Coronary artery disease     a. INF MI 1999: PCI of RCA;  b.  extensive stenting of LAD;  c. s/p CABG with RIMA->RCA in 2006;  d. Stable, Low risk MV 05/2012; e. 09/2012 Cath: stable anatomy->med Rx. f. Abnl nuc 11/2013 -> med rx; g. 11/2013 Cath/PCI: LM 30d, LAD 50ost, 20/50isr, 100d, LCX 40-50ost, OM2 50ost, RI 70-80ost sup branch, RCA 20p, 90/60d(2.25x17 & 2.25x24 Promus DES'), PDA 60p, RIMA->RCA ok.  . Hypertensive heart disease   . Hx of echocardiogram     a. Echo 3/12: mild LVH, EF 55-60%, trivial AI  . Anginal pain   . Myocardial infarction 1999; 2002; 2003; 2006; ~ 2008  . OSA on CPAP   . DM2 (diabetes mellitus, type 2)   . GERD (gastroesophageal reflux disease)   . History of gout   . CKD (chronic kidney disease), stage III   . Polycystic kidney disease    Past Surgical History  Procedure Laterality Date  . Cystectomy  1990's    off face  . Left heart catheterization with coronary/graft angiogram N/A 09/16/2012    Procedure: LEFT HEART CATHETERIZATION WITH [name];   Surgeon: [name], MD;  Location: [facility] CATH LAB;  Service: Cardiovascular;  Laterality: N/A;  . Left heart catheterization with coronary/graft angiogram N/A 12/02/2013    Procedure: LEFT HEART CATHETERIZATION WITH [name];  Surgeon: [name], MD;  Location: [facility] CATH LAB;  Service: Cardiovascular;  Laterality: N/A;  . Percutaneous coronary stent intervention (pci-s) N/A 12/03/2013    Procedure: PERCUTANEOUS CORONARY STENT INTERVENTION (PCI-S);  Surgeon: [name], MD;  Location: [facility] CATH LAB;  Service: Cardiovascular;  Laterality: N/A;  . Coronary artery bypass graft  2006    CABG X?; in [place]  . Coronary angioplasty with stent placement  1999 / 2002 / 2004 / 2006?  [facility] Cardiac catheterization  2012  . Cardiac catheterization  2013  . Cardiac catheterization  2014    LHC (1/14): total occlusion of distal LAD, diffuse disease in ramus, 70% proximal LCx, 50% proximal and mid RCA, 50% distal RCA, patent RIMA-RCA.   Family History  Problem Relation Age of Onset  . Lung cancer Mother   . Anemia Mother   . Polycystic kidney disease Mother   . Diabetes Father   . Heart attack Father 28    Died suddenly  . Heart failure Father   . Hyperlipidemia Father   . Hypertension  Father   . Kidney failure Sister 5    Died  . Heart attack Sister   . Hypertension Sister   . Diabetes Sister   . Diabetes Brother   . Polycystic kidney disease Brother   . Diabetes Sister    Social History  Substance Use Topics  . Smoking status: Former Smoker    Types: Cigarettes  . Smokeless tobacco: Never Used     Comment: smoked some as a teenager (high school)  . Alcohol Use: No     Comment: 09/15/2012 "drank a little when I was young"    Review of Systems  A complete 10 system review of systems was obtained and all systems are negative except as noted in the HPI and PMH.    Allergies  Ciprocin-fluocin-procin; Clarithromycin; Cleocin; and Glimepiride  Home Medications    Prior to Admission medications   Medication Sig Start Date End Date Taking? Authorizing Provider  ACCU-CHEK SOFTCLIX LANCETS lancets Use as instructed to check blood sugar 4 times per day dx code E.11.65 03/30/15   Darreld Mclean, MD  acetaminophen (TYLENOL) 325 MG tablet Take 2 tablets (650 mg total) by mouth every 4 (four) hours as needed for headache or mild pain. 01/02/15   Erlene Quan, PA-C  Alcohol Swabs PADS Use as instructed to check blood sugar 4 times per day dx code E.11.65 03/30/15   Gay Filler Copland, MD  amLODipine (NORVASC) 10 MG tablet Take 1 tablet (10 mg total) by mouth daily. 01/12/15   Darreld Mclean, MD  aspirin 81 MG tablet Take 1 tablet (81 mg total) by mouth daily. 12/04/13   Rogelia Mire, NP  B-D UF III MINI PEN NEEDLES 31G X 5 MM MISC USE ONE DAILY FOR INSULIN INJECTION 09/23/14   Darreld Mclean, MD  Blood Glucose Calibration (SURECHEK CONTROL SOLUTION) NORMAL LIQD Use as instructed to check blood sugar 4 times per day dx code E.11.65 03/30/15   Gay Filler Copland, MD  blood glucose meter kit and supplies KIT Use as instructed to check blood sugar 4 times per day dx code E.11.65 03/30/15   Gay Filler Copland, MD  carvedilol (COREG) 25 MG tablet TAKE 1 AND 1/2 TABLETS TWICE DAILY WITH MEALS 01/12/15   Darreld Mclean, MD  chlorpheniramine-HYDROcodone (TUSSIONEX PENNKINETIC ER) 10-8 MG/5ML SUER Take 5 mLs by mouth every 12 (twelve) hours as needed for cough. 02/15/15   Debby Freiberg, MD  clopidogrel (PLAVIX) 75 MG tablet Take 1 tablet (75 mg total) by mouth daily. With breakfast. PATIENT NEEDS OFFICE VISIT FOR ADDITIONAL REFILLS 03/31/15   Darreld Mclean, MD  colchicine 0.6 MG tablet Take 2 pills today, then may take 1 pill a day for the next 2-3 days if needed for gout 11/24/14   Gay Filler Copland, MD  fenofibrate (TRICOR) 145 MG tablet Take 1 tablet (145 mg total) by mouth daily. 11/08/14   Larey Dresser, MD  gabapentin (NEURONTIN) 300 MG capsule Take 1 capsule  (300 mg total) by mouth 3 (three) times daily. PATIENT NEEDS OFFICE VISIT FOR ADDITIONAL REFILLS 03/31/15   Darreld Mclean, MD  glucose blood (ACCU-CHEK AVIVA PLUS) test strip Use as instructed to check blood sugar 4 times per day dx code E11.65 03/30/15   Darreld Mclean, MD  hydrochlorothiazide (HYDRODIURIL) 25 MG tablet  02/02/15   Historical Provider, MD  Insulin Glargine (LANTUS SOLOSTAR) 100 UNIT/ML Solostar Pen INJECT 20-30 UNITS INTO THE SKIN 2 (TWO) TIMES DAILY. USUALLY TAKES  20 UNITS, IF BLOOD SUGARS ARE ELEVATED CAN TAKE UP TO 30 UNITS.  "OV NEEDED" 03/23/15   Jaynee Eagles, PA-C  insulin NPH-regular Human (NOVOLIN 70/30 RELION) (70-30) 100 UNIT/ML injection Inject 15 Units into the skin 3 (three) times daily. 08/10/14   Elayne Snare, MD  Insulin Syringe-Needle U-100 (INSULIN SYRINGE .5CC/31GX5/16") 31G X 5/16" 0.5 ML MISC Use one needle three times per day 05/26/14   Elayne Snare, MD  isosorbide mononitrate (IMDUR) 60 MG 24 hr tablet Take 1 tablet (60 mg total) by mouth 2 (two) times daily. 02/03/15   Larey Dresser, MD  losartan (COZAAR) 100 MG tablet Take 1 tablet (100 mg total) by mouth daily. PATIENT NEEDS OFFICE VISIT FOR ADDITIONAL REFILLS 03/31/15   Darreld Mclean, MD  Multiple Vitamin (MULTIVITAMIN WITH MINERALS) TABS tablet Take 1 tablet by mouth daily.    Historical Provider, MD  nitroGLYCERIN (NITROSTAT) 0.4 MG SL tablet Place 1 tablet (0.4 mg total) under the tongue every 5 (five) minutes as needed for chest pain. 11/03/14   Larey Dresser, MD  Omega-3 Fatty Acids (FISH OIL) 1200 MG CAPS Take 1 capsule (1,200 mg total) by mouth 2 (two) times daily. 02/03/15   Larey Dresser, MD  omeprazole (PRILOSEC) 20 MG capsule Take 1 capsule (20 mg total) by mouth daily as needed (indigestion). 01/02/15   Erlene Quan, PA-C  pravastatin (PRAVACHOL) 40 MG tablet TAKE 1 TABLET EVERY EVENING 12/20/14   Darreld Mclean, MD  ranolazine (RANEXA) 1000 MG SR tablet Take 1 tablet (1,000 mg total) by mouth  2 (two) times daily. 09/19/14   Rogelia Mire, NP  zolpidem (AMBIEN) 10 MG tablet TAKE 1 TABLET AT BEDTIME 01/05/15   Gay Filler Copland, MD   BP 144/76 mmHg  Pulse 82  Temp(Src) 98.2 F (36.8 C) (Oral)  Resp 24  SpO2 100% Physical Exam  Constitutional: He is oriented to person, place, and time. Vital signs are normal. He appears well-developed and well-nourished.  Non-toxic appearance. He does not appear ill. No distress.  HENT:  Head: Normocephalic and atraumatic.  Nose: Nose normal.  Mouth/Throat: Oropharynx is clear and moist. No oropharyngeal exudate.  Eyes: Conjunctivae and EOM are normal. Pupils are equal, round, and reactive to light. No scleral icterus.  Neck: Normal range of motion. Neck supple. No tracheal deviation, no edema, no erythema and normal range of motion present. No thyroid mass and no thyromegaly present.  Cardiovascular: Normal rate, regular rhythm, S1 normal, S2 normal, normal heart sounds, intact distal pulses and normal pulses.  Exam reveals no gallop and no friction rub.   No murmur heard. Pulses:      Radial pulses are 2+ on the right side, and 2+ on the left side.       Dorsalis pedis pulses are 2+ on the right side, and 2+ on the left side.  Pulmonary/Chest: Effort normal and breath sounds normal. No respiratory distress. He has no wheezes. He has no rhonchi. He has no rales.  Abdominal: Soft. Normal appearance and bowel sounds are normal. He exhibits no distension, no ascites and no mass. There is no hepatosplenomegaly. There is no tenderness. There is no rebound, no guarding and no CVA tenderness.  Musculoskeletal: Normal range of motion. He exhibits edema (BLE). He exhibits no tenderness.  Lymphadenopathy:    He has no cervical adenopathy.  Neurological: He is alert and oriented to person, place, and time. He has normal strength. No cranial nerve deficit or sensory deficit.  Skin: Skin is warm, dry and intact. No petechiae and no rash noted. He is not  diaphoretic. No erythema. No pallor.  Psychiatric: He has a normal mood and affect. His behavior is normal. Judgment normal.  Nursing note and vitals reviewed.   ED Course  Procedures   DIAGNOSTIC STUDIES:  Oxygen Saturation is 100% on RA, normal by my interpretation.    COORDINATION OF CARE:  2:39 AM Discussed treatment plan with pt at bedside and pt agreed to plan.  Labs Review Labs Reviewed  BASIC METABOLIC PANEL - Abnormal; Notable for the following:    Sodium 133 (*)    CO2 18 (*)    Glucose, Bld 364 (*)    BUN 31 (*)    Creatinine, Ser 2.53 (*)    GFR calc non Af Amer 27 (*)    GFR calc Af Amer 31 (*)    All other components within normal limits  CBC - Abnormal; Notable for the following:    RBC 3.97 (*)    Hemoglobin 11.5 (*)    HCT 33.5 (*)    All other components within normal limits  PROTIME-INR  I-STAT TROPOININ, ED    Imaging Review Dg Chest 2 View  05/08/2015   CLINICAL DATA:  Left-sided chest pain and shortness of breath for 2 days.  EXAM: CHEST  2 VIEW  COMPARISON:  02/15/2015  FINDINGS: Patient is post median sternotomy. Lung volumes are low. The cardiomediastinal contours are normal for technique. No consolidation, pleural effusion, or pneumothorax. No acute osseous abnormalities are seen.  IMPRESSION: Hypoventilatory chest without evidence of acute process.   Electronically Signed   By: Jeb Levering M.D.   On: 05/08/2015 02:34   I have personally reviewed and evaluated these images and lab results as part of my medical decision-making.   EKG Interpretation   Date/Time:  Sunday May 08 2015 01:56:09 EDT Ventricular Rate:  75 PR Interval:  162 QRS Duration: 82 QT Interval:  388 QTC Calculation: 433 R Axis:   127 Text Interpretation:  Normal sinus rhythm Left posterior fascicular block  Anterior infarct , age undetermined Abnormal ECG No significant change  since last tracing Confirmed by Glynn Octave [phone]) on 05/08/2015  3:09:14  AM      MDM   Final diagnoses:  None   Patient presents to the ED for chest pain, he states is similar to his prior MI.  He was given aspirin and nitro in the ED.  I spoke with Dr. Blaine Hamper who accepts the patient to step down for ongoing chest pain   I personally performed the services described in this documentation, which was scribed in my presence. The recorded information has been reviewed and is accurate.   Everlene Balls, MD 05/08/15 [phone]

## 2015-05-12 ENCOUNTER — Telehealth: Payer: Self-pay | Admitting: *Deleted

## 2015-05-12 NOTE — Telephone Encounter (Signed)
Ranexa 1000 mg bid

## 2015-05-12 NOTE — Telephone Encounter (Signed)
Should the patients current ranexa therapy be 500mg  bid? Looks like dose was reduced in the hospital, but wanted to clarify this. Please advise. Thanks, MI

## 2015-05-12 NOTE — Telephone Encounter (Signed)
Spoke with patient and he is aware ranexa 1000mg  bid, samples placed at the front desk for pick up per his request.

## 2015-05-12 NOTE — Telephone Encounter (Signed)
Dr Aundra Dubin:  When he saw you in the office 02/03/15 he was on Ranexa 1000mg  bid. He went to ED 05/08/15 with chest pain, Dr Radford Pax saw him in consult 05/08/15, I do not see a discharge summary. His current Ranexa dose is listed as 500mg  bid, I cannot find a note about decreasing the dose.  What dose of Ranexa do you want him to take?

## 2015-05-19 NOTE — Discharge Summary (Signed)
Physician Discharge Summary  Brownie Nehme AST:[id number] DOB: 1961/01/26 DOA: 05/08/2015  PCP: Barrie Lyme, FNP  Admit date: 05/08/2015 Discharge date: 05/08/2015  Time spent: 45 minutes  Recommendations for Outpatient Follow-up:  1. Dr.McLean in 2 weeks  Discharge Diagnoses:  Principal Problem:   Chest pain Active Problems:   Dyslipidemia   HTN (hypertension)   Acute renal failure superimposed on stage 3 chronic kidney disease   Polycystic kidney disease   OSA on CPAP   Obesity-BMI 42   Type 2 diabetes mellitus with renal manifestations   CAD- CABG 2006, RCA DES March 2015   GERD (gastroesophageal reflux disease)   Gout   Chronic diastolic CHF (congestive heart failure)   Discharge Condition: stable  Diet recommendation: diabetic, heart ehalthy  [facility] Weights   05/08/15 0423 05/08/15 0520  Weight: 120.203 kg (265 lb) 118.7 kg (261 lb 11 oz)    History of present illness:  Chief Complaint: Chest pain  HPI: Matthew Rowe. is a 54 y.o. male with PMH of hypertension, diabetes mellitus, hyperlipidemia, GERD, gout, OSA on CPAP, PCKD, diastolic congestive heart failure, CAD, s/p of stent 1999, 2002, 2004 and 2015, s/p of CABG 2006, who presented with the chest pain.  Patient reported that he started having chest pain at about 9 PM on 8/28, located in the left chest, 10 out of 10 in severity, constant, pressure-like pain, nonradiating. It is associated with mild shortness of breath, but no cough, fever or chills. Chest pain is not pleuritic. He took SL nitroglycerin twice without significant help  Hospital Course:  54/M with CAD, CABG, PCI/stent to RCA in 3/15, chronic stable angina, admitted with perisitent chest pain He had lifted a heavy fireplace on thursday and again on Saturday before this started EKG with TW flattening in inferior and lateral leads, Troponins x3 negative -His chest pain was reproducible on palpation and  felt to be musculoskeletal in  etiology  -Cards consulted, seen by Dr.Turner and also agreed that this was likely muscular in origin and recommended close FU with Dr.McLean as outpatient, his symptoms resolved and did not recur with ambulation by the time of discharge -Also has CKD 3-4, creatinine close to baseline 2.3. -Rest of his medical problems were stable  Consultations:  Cardiology  Discharge Exam: Filed Vitals:   05/08/15 1500  BP: 117/73  Pulse: 68  Temp: 98.4 F (36.9 C)  Resp: 20    General: AAOx3 Cardiovascular: S1S2/RRR Respiratory: CTAB  Discharge Instructions   Discharge Instructions    Diet - low sodium heart healthy    Complete by:  As directed      Diet Carb Modified    Complete by:  As directed      Increase activity slowly    Complete by:  As directed           Discharge Medication List as of 05/08/2015  5:26 PM    CONTINUE these medications which have NOT CHANGED   Details  acetaminophen (TYLENOL) 325 MG tablet Take 2 tablets (650 mg total) by mouth every 4 (four) hours as needed for headache or mild pain., Starting 01/02/2015, Until Discontinued, No Print    amLODipine (NORVASC) 10 MG tablet Take 1 tablet (10 mg total) by mouth daily., Starting 01/12/2015, Until Discontinued, Normal    aspirin 81 MG tablet Take 1 tablet (81 mg total) by mouth daily., Starting 12/04/2013, Until Discontinued, No Print    carvedilol (COREG) 25 MG tablet TAKE 1 AND 1/2  TABLETS TWICE DAILY WITH MEALS, Normal    clopidogrel (PLAVIX) 75 MG tablet Take 1 tablet (75 mg total) by mouth daily. With breakfast. PATIENT NEEDS OFFICE VISIT FOR ADDITIONAL REFILLS, Starting 03/31/2015, Until Discontinued, Normal    gabapentin (NEURONTIN) 300 MG capsule Take 1 capsule (300 mg total) by mouth 3 (three) times daily. PATIENT NEEDS OFFICE VISIT FOR ADDITIONAL REFILLS, Starting 03/31/2015, Until Discontinued, Normal    hydrochlorothiazide (HYDRODIURIL) 25 MG tablet Take 25 mg by mouth daily. , Starting 02/02/2015, Until  Discontinued, Historical Med    Insulin Glargine (LANTUS SOLOSTAR) 100 UNIT/ML Solostar Pen INJECT 20-30 UNITS INTO THE SKIN 2 (TWO) TIMES DAILY. USUALLY TAKES 20 UNITS, IF BLOOD SUGARS ARE ELEVATED CAN TAKE UP TO 30 UNITS.  "OV NEEDED", Print    insulin NPH-regular Human (NOVOLIN 70/30 RELION) (70-30) 100 UNIT/ML injection Inject 15 Units into the skin 3 (three) times daily., Starting 08/10/2014, Until Discontinued, Normal    isosorbide mononitrate (IMDUR) 60 MG 24 hr tablet Take 1 tablet (60 mg total) by mouth 2 (two) times daily., Starting 02/03/2015, Until Discontinued, Historical Med    losartan (COZAAR) 100 MG tablet Take 1 tablet (100 mg total) by mouth daily. PATIENT NEEDS OFFICE VISIT FOR ADDITIONAL REFILLS, Starting 03/31/2015, Until Discontinued, Normal    Multiple Vitamin (MULTIVITAMIN WITH MINERALS) TABS tablet Take 1 tablet by mouth daily., Until Discontinued, Historical Med    nitroGLYCERIN (NITROSTAT) 0.4 MG SL tablet Place 1 tablet (0.4 mg total) under the tongue every 5 (five) minutes as needed for chest pain., Starting 11/03/2014, Until Discontinued, Normal    Omega-3 Fatty Acids (FISH OIL) 1200 MG CAPS Take 1 capsule (1,200 mg total) by mouth 2 (two) times daily., Starting 02/03/2015, Until Discontinued, Historical Med    pantoprazole (PROTONIX) 40 MG tablet Take 40 mg by mouth 2 (two) times daily., Until Discontinued, Historical Med    pravastatin (PRAVACHOL) 40 MG tablet TAKE 1 TABLET EVERY EVENING, Normal    ranolazine (RANEXA) 500 MG 12 hr tablet Take 500 mg by mouth 2 (two) times daily., Until Discontinued, Historical Med    vitamin E 400 UNIT capsule Take 400 Units by mouth daily., Until Discontinued, Historical Med    zolpidem (AMBIEN) 10 MG tablet TAKE 1 TABLET AT BEDTIME, Print    ACCU-CHEK SOFTCLIX LANCETS lancets Use as instructed to check blood sugar 4 times per day dx code E.11.65, Normal    Alcohol Swabs PADS Use as instructed to check blood sugar 4 times per  day dx code E.11.65, Normal    B-D UF III MINI PEN NEEDLES 31G X 5 MM MISC USE ONE DAILY FOR INSULIN INJECTION, Normal    Blood Glucose Calibration (SURECHEK CONTROL SOLUTION) NORMAL LIQD Use as instructed to check blood sugar 4 times per day dx code E.11.65, Normal    blood glucose meter kit and supplies KIT Use as instructed to check blood sugar 4 times per day dx code E.11.65, Normal    chlorpheniramine-HYDROcodone (TUSSIONEX PENNKINETIC ER) 10-8 MG/5ML SUER Take 5 mLs by mouth every 12 (twelve) hours as needed for cough., Starting 02/15/2015, Until Discontinued, Print    colchicine 0.6 MG tablet Take 2 pills today, then may take 1 pill a day for the next 2-3 days if needed for gout, Normal    fenofibrate (TRICOR) 145 MG tablet Take 1 tablet (145 mg total) by mouth daily., Starting 11/08/2014, Until Discontinued, Normal    glucose blood (ACCU-CHEK AVIVA PLUS) test strip Use as instructed to check blood sugar 4  times per day dx code E11.65, Normal    Insulin Syringe-Needle U-100 (INSULIN SYRINGE .5CC/31GX5/16") 31G X 5/16" 0.5 ML MISC Use one needle three times per day, Normal    omeprazole (PRILOSEC) 20 MG capsule Take 1 capsule (20 mg total) by mouth daily as needed (indigestion)., Starting 01/02/2015, Until Discontinued, OTC       Allergies  Allergen Reactions  . Ciprocin-Fluocin-Procin [Fluocinolone Acetonide] Rash  . Clarithromycin Itching and Other (See Comments)    "Biaxin" Eyes itch and burn  . Cleocin [Clindamycin Hcl] Anaphylaxis and Swelling  . Glimepiride [Amaryl] Other (See Comments)    Elevates liver function    Follow-up Information    Follow up with [name], MD. Schedule an appointment as soon as possible for a visit in 2 weeks.   Specialty:  Cardiology   Contact information:   [address] [place] [address] [phone]        The results of significant diagnostics from this hospitalization (including imaging, microbiology,  ancillary and laboratory) are listed below for reference.    Significant Diagnostic Studies: Dg Chest 2 View  05/08/2015   CLINICAL DATA:  Left-sided chest pain and shortness of breath for 2 days.  EXAM: CHEST  2 VIEW  COMPARISON:  02/15/2015  FINDINGS: Patient is post median sternotomy. Lung volumes are low. The cardiomediastinal contours are normal for technique. No consolidation, pleural effusion, or pneumothorax. No acute osseous abnormalities are seen.  IMPRESSION: Hypoventilatory chest without evidence of acute process.   Electronically Signed   By: Jeb Levering M.D.   On: 05/08/2015 02:34   US Renal  05/08/2015   CLINICAL DATA:  Acute kidney injury.  Polycystic kidney disease.  EXAM: RENAL / URINARY TRACT ULTRASOUND COMPLETE  COMPARISON:  11/08/2011  FINDINGS: Right Kidney:  Length: 21.2 cm. Multiple cysts throughout the left kidney compatible with polycystic kidney disease, the largest 6.5 cm. Difficult to visualize normal renal parenchyma. No hydronephrosis.  Left Kidney:  Length: 21.8 cm. Multiple cysts throughout the kidney compatible with polycystic kidney disease, the largest 5.8 cm. No hydronephrosis.  Bladder:  Appears normal for degree of bladder distention.  IMPRESSION: Multiple bilateral renal cysts compatible with polycystic kidney disease.  No hydronephrosis.   Electronically Signed   By: Rolm Baptise M.D.   On: 05/08/2015 12:23    Microbiology: No results found for this or any previous visit (from the past 240 hour(s)).   Labs: Basic Metabolic Panel: No results for input(s): NA, K, CL, CO2, GLUCOSE, BUN, CREATININE, CALCIUM, MG, PHOS in the last 168 hours. Liver Function Tests: No results for input(s): AST, ALT, ALKPHOS, BILITOT, PROT, ALBUMIN in the last 168 hours. No results for input(s): LIPASE, AMYLASE in the last 168 hours. No results for input(s): AMMONIA in the last 168 hours. CBC: No results for input(s): WBC, NEUTROABS, HGB, HCT, MCV, PLT in the last 168  hours. Cardiac Enzymes: No results for input(s): CKTOTAL, CKMB, CKMBINDEX, TROPONINI in the last 168 hours. BNP: BNP (last 3 results)  Recent Labs  12/31/14 2251 02/15/15 1948 05/08/15 0835  BNP 127.0* 66.9 57.3    ProBNP (last 3 results) No results for input(s): PROBNP in the last 8760 hours.  CBG: No results for input(s): GLUCAP in the last 168 hours.     SignedDomenic Polite  [facility] 05/19/2015, 2:55 PM

## 2015-06-03 ENCOUNTER — Ambulatory Visit (INDEPENDENT_AMBULATORY_CARE_PROVIDER_SITE_OTHER): Payer: Commercial Managed Care - HMO | Admitting: Cardiology

## 2015-06-03 ENCOUNTER — Encounter: Payer: Self-pay | Admitting: Cardiology

## 2015-06-03 ENCOUNTER — Encounter: Payer: Self-pay | Admitting: *Deleted

## 2015-06-03 VITALS — BP 110/70 | HR 73 | Ht 65.5 in | Wt 263.0 lb

## 2015-06-03 DIAGNOSIS — I2510 Atherosclerotic heart disease of native coronary artery without angina pectoris: Secondary | ICD-10-CM | POA: Diagnosis not present

## 2015-06-03 DIAGNOSIS — E119 Type 2 diabetes mellitus without complications: Secondary | ICD-10-CM

## 2015-06-03 DIAGNOSIS — E785 Hyperlipidemia, unspecified: Secondary | ICD-10-CM

## 2015-06-03 DIAGNOSIS — Q613 Polycystic kidney, unspecified: Secondary | ICD-10-CM | POA: Diagnosis not present

## 2015-06-03 DIAGNOSIS — I5032 Chronic diastolic (congestive) heart failure: Secondary | ICD-10-CM

## 2015-06-03 LAB — HEMOGLOBIN A1C
HEMOGLOBIN A1C: 10.7 % — AB (ref <5.7)
Mean Plasma Glucose: 260 mg/dL — ABNORMAL HIGH (ref <117)

## 2015-06-03 NOTE — Patient Instructions (Addendum)
Medication Instructions: - no changes  Labwork: - Your physician recommends that you have lab work today: lipid/ HgBA1C  Procedures/Testing: - none  Follow-Up: - Your physician wants you to follow-up in: 6 months with Dr. Aundra Dubin (March 2017) You will receive a reminder letter in the mail two months in advance. If you don't receive a letter, please call our office to schedule the follow-up appointment.  Any Additional Special Instructions Will Be Listed Below (If Applicable).

## 2015-06-04 LAB — LIPID PANEL
CHOL/HDL RATIO: 7.3 ratio — AB (ref <=5.0)
CHOLESTEROL: 189 mg/dL (ref 125–200)
HDL: 26 mg/dL — AB (ref >=40)
TRIGLYCERIDES: 654 mg/dL — AB (ref <150)

## 2015-06-05 NOTE — Progress Notes (Signed)
Patient ID: Matthew Rowe., male   DOB: Jan 15, 1961, 54 y.o.   MRN: [id number] PCP: Dr. Lorelei Pont  54 yo with history CAD s/p CABG (RIMA to RCA, anomalous RCA off left cusp), occlusion of the distal LAD, and chronic angina presents for cardiology followup.  Patient has had chronic angina for years. In 1/14, he developed progressive angina and was hospitalized.  LHC was done, showing unchanged anatomy.  RIMA-RCA was patent.  He did well after this on ranolazine.  In 4/14, he ran out of ranolazine and the chest pain returned. He was admitted overnight in 4/14 and ruled out for MI.  In 7/14, he had again run low on ranolazine and developed more chest pain.  He was admitted again and ruled out for MI.  In 3/15, he was admitted with recurrent chest pain.  LHC was done, showing 90% distal RCA stenosis after RIMA insertion.  He had Promus DES x 2 to distal RCA.  He ran out of amlodipine and losartan in 1/16 and developed chest pain in the setting of elevated BP. He was admitted and ruled out for MI.  Chest pain resolved with BP control. He was admitted again in 4/16 with a hypertensive urgency after running out of his meds.  Last echo in 4/16 showed EF 50-55% and mild AS.  He was admitted in 8/16 when he had chest pain after lifting a heavy load.  He was ruled out for MI and sent home.    He is now on all his medications.  He is at baseline.  He has chest pain only with heavy exertion.  This is his chronic stable pattern.  He is rarely using NTG.  No significant exertional dyspnea walking on flat ground.  No palpitations, orthopnea, PND.  He is using CPAP.  Weight is up 12 lbs.  He has not been exercising.   Labs (7/13): K 4.9, creatinine 1.75, LDL 63 Labs (1/14): K 4.2, creatinine 1.65, TGs 483 (could not calculate LDL) Labs (4/14): K 4.8, creatinine 1.62, HCT 36.8 Labs (7/14): K 4.3, creatinine 1.63, LDL 56, HDL 34, TGs 382 Labs (1/15): K 4.6, creatinine 2 Labs (3/15): TGs 605, LDL unable to calculate Labs  (5/15): K 4.7, creatinine 2.2 Labs (7/15): K 4.4, creatinine 2.2, LDL 51, HDL 32, TGs 983 Labs (1/16): TGs 524, unable to calculate LDL Labs (2/16): K 4.7, creatinine 1.98 Labs (4/16): K 4.4, creatinine 2.19, HCT 34.6, TGs 526, unable to calculate LDL Labs (8/16): BNP 57, K 4.2, creatinine 2.67  PMH: 1. CAD: CABG 2006 at [facility] with [facility].  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  Patient has had prior extensive PCI to LAD with occlusion of the distal LAD.  LHC (7/12): proximal LAD stent, extensive mid-distal LAD stenting, 60-70% mid LAD instent restenosis, subtotal distal LAD instent restenosis, LCFx without significant disease, anomalous RCA off left cusp with mild disease, patent RIMA-RCA.  Echo (3/12): EF 55-60%, trivial AI, mild LVH.  Lexiscan myoview (9/13) with small apical scar, moderate inferior scar from base to apex with some reversibility, EF 47% with apical hypokinesis (similar to prior study).  Chronic angina.  LHC (1/14): total occlusion of distal LAD, diffuse disease in ramus, 70% proximal LCx, 50% proximal and mid RCA, 50% distal RCA, patent RIMA-RCA.   LHC (3/15) with multiple stents in the LAD and 50% ostial LAD stenosis, total occlusion distal LAD, 40-50% ostial LCx stenosis, 90% distal RCA stenosis after RIMA insertion.  Patient  had DES x 2 to distal RCA.  2. OSA: CPAP. 3. Type II diabetes 4. Hyperlipidemia: had side effects from Lipitor. Very high TGs.  5. HTN 6. CKD: polycystic kidney disease.  7. Ischemic cardiomyopathy: EF 55-60% by echo in 3/12, EF 47% with apical hypokinesis in 9/13.  Echo (4/16) with EF 50-55%, mild LVH, mild AS with mean gradient 14 mmHg.  8. Gout 9. Aortic stenosis: Mild.   SH: Nonsmoker.  Married, lives in [place].  On disability.   FH: Multiple family members with MIs.   ROS: All systems reviewed and negative except as per HPI.   Current Outpatient Prescriptions  Medication Sig Dispense Refill  . ACCU-CHEK  SOFTCLIX LANCETS lancets Use as instructed to check blood sugar 4 times per day dx code E.11.65 400 each 2  . acetaminophen (TYLENOL) 325 MG tablet Take 2 tablets (650 mg total) by mouth every 4 (four) hours as needed for headache or mild pain.    . Alcohol Swabs PADS Use as instructed to check blood sugar 4 times per day dx code E.11.65 400 each 2  . amLODipine (NORVASC) 10 MG tablet Take 1 tablet (10 mg total) by mouth daily. 90 tablet 3  . aspirin 81 MG tablet Take 1 tablet (81 mg total) by mouth daily.    . B-D UF III MINI PEN NEEDLES 31G X 5 MM MISC USE ONE DAILY FOR INSULIN INJECTION 90 each 1  . Blood Glucose Calibration (SURECHEK CONTROL SOLUTION) NORMAL LIQD Use as instructed to check blood sugar 4 times per day dx code E.11.65 1 each 2  . blood glucose meter kit and supplies KIT Use as instructed to check blood sugar 4 times per day dx code E.11.65 1 each 0  . carvedilol (COREG) 25 MG tablet TAKE 1 AND 1/2 TABLETS TWICE DAILY WITH MEALS 270 tablet 3  . chlorpheniramine-HYDROcodone (TUSSIONEX PENNKINETIC ER) 10-8 MG/5ML SUER Take 5 mLs by mouth every 12 (twelve) hours as needed for cough. 140 mL 0  . clopidogrel (PLAVIX) 75 MG tablet Take 1 tablet (75 mg total) by mouth daily. With breakfast. PATIENT NEEDS OFFICE VISIT FOR ADDITIONAL REFILLS 90 tablet 0  . colchicine 0.6 MG tablet Take 2 pills today, then may take 1 pill a day for the next 2-3 days if needed for gout 30 tablet 0  . gabapentin (NEURONTIN) 300 MG capsule Take 1 capsule (300 mg total) by mouth 3 (three) times daily. PATIENT NEEDS OFFICE VISIT FOR ADDITIONAL REFILLS 270 capsule 0  . glucose blood (ACCU-CHEK AVIVA PLUS) test strip Use as instructed to check blood sugar 4 times per day dx code E11.65 400 each 2  . Insulin Glargine (LANTUS SOLOSTAR) 100 UNIT/ML Solostar Pen INJECT 20-30 UNITS INTO THE SKIN 2 (TWO) TIMES DAILY. USUALLY TAKES 20 UNITS, IF BLOOD SUGARS ARE ELEVATED CAN TAKE UP TO 30 UNITS.  "OV NEEDED" 45 mL 0  .  insulin NPH-regular Human (NOVOLIN 70/30 RELION) (70-30) 100 UNIT/ML injection Inject 15 Units into the skin 3 (three) times daily. (Patient taking differently: Inject 30 Units into the skin 3 (three) times daily. ) 20 mL 2  . Insulin Syringe-Needle U-100 (INSULIN SYRINGE .5CC/31GX5/16") 31G X 5/16" 0.5 ML MISC Use one needle three times per day 100 each 3  . isosorbide mononitrate (IMDUR) 60 MG 24 hr tablet Take 1 tablet (60 mg total) by mouth 2 (two) times daily. 90 tablet 3  . losartan (COZAAR) 100 MG tablet Take 1 tablet (100 mg total) by mouth  daily. PATIENT NEEDS OFFICE VISIT FOR ADDITIONAL REFILLS 90 tablet 0  . Multiple Vitamin (MULTIVITAMIN WITH MINERALS) TABS tablet Take 1 tablet by mouth daily.    . naproxen sodium (ANAPROX) 220 MG tablet Take 220 mg by mouth 2 (two) times daily with a meal.    . nitroGLYCERIN (NITROSTAT) 0.4 MG SL tablet Place 1 tablet (0.4 mg total) under the tongue every 5 (five) minutes as needed for chest pain. 25 tablet 11  . Omega-3 Fatty Acids (FISH OIL) 1200 MG CAPS Take 1 capsule (1,200 mg total) by mouth 2 (two) times daily.    . pantoprazole (PROTONIX) 40 MG tablet Take 40 mg by mouth 2 (two) times daily.    . pravastatin (PRAVACHOL) 40 MG tablet TAKE 1 TABLET EVERY EVENING 90 tablet 1  . ranolazine (RANEXA) 500 MG 12 hr tablet Take 500 mg by mouth 2 (two) times daily.    . vitamin E 400 UNIT capsule Take 400 Units by mouth daily.    Marland Kitchen zolpidem (AMBIEN) 10 MG tablet TAKE 1 TABLET AT BEDTIME 90 tablet 0   No current facility-administered medications for this visit.    BP 110/70 mmHg  Pulse 73  Ht 5' 5.5" (1.664 m)  Wt 263 lb (119.296 kg)  BMI 43.08 kg/m2  SpO2 98% General: NAD, obese.  Neck: Thick, no JVD, no thyromegaly or thyroid nodule.  Lungs: Clear to auscultation bilaterally with normal respiratory effort. CV: Nondisplaced PMI.  Heart regular S1/S2, no S3/S4, 1/6 SEM RUSB.  No peripheral edema.  No carotid bruit.  Normal pedal pulses.  Abdomen:  Soft, nontender, no hepatosplenomegaly, no distention.  Neurologic: Alert and oriented x 3.  Psych: Normal affect. Extremities: No clubbing or cyanosis.   Assessment/Plan: 1. CAD:  Chronic stable angina as long as he stays on his medications.  Has more chest pain when he runs out.  Currently stable, on all meds.   - Continue ASA 81 and Plavix long-term. - Continue statin, Imdur, amlodipine, ranolazine, Coreg.    2. HTN: Controlled as long as he stays on his meds. 3. Hyperlipidemia: Check lipids today.  4. CKD: Patient has APKD.  Creatinine stable.  Needs regular followup with nephrology. 5. OSA: Continue CPAP.  6. Obesity: He plans to join [employer].    [name] 06/05/2015

## 2015-06-06 ENCOUNTER — Telehealth: Payer: Self-pay

## 2015-06-06 ENCOUNTER — Ambulatory Visit: Payer: Commercial Managed Care - HMO | Admitting: Neurology

## 2015-06-06 DIAGNOSIS — E785 Hyperlipidemia, unspecified: Secondary | ICD-10-CM

## 2015-06-06 MED ORDER — FENOFIBRATE 145 MG PO TABS: 145.0000 mg | ORAL_TABLET | Freq: Every day | ORAL | Status: DC

## 2015-06-06 NOTE — Telephone Encounter (Signed)
yes

## 2015-06-06 NOTE — Telephone Encounter (Signed)
Order placed for fenofibrate 145 mg daily  and sent to local pharmacy.

## 2015-06-06 NOTE — Telephone Encounter (Signed)
Called patient about lab results. Per Dr. Aundra Dubin, triglycerides are very high. Would start generic fenofibrate at 145 mg daily with lipids in 2 months.

## 2015-06-09 DIAGNOSIS — G4733 Obstructive sleep apnea (adult) (pediatric): Secondary | ICD-10-CM | POA: Diagnosis not present

## 2015-07-09 DIAGNOSIS — G4733 Obstructive sleep apnea (adult) (pediatric): Secondary | ICD-10-CM | POA: Diagnosis not present

## 2015-07-17 ENCOUNTER — Emergency Department (HOSPITAL_COMMUNITY)
Admission: EM | Admit: 2015-07-17 | Discharge: 2015-07-17 | Disposition: A | Discharge status: Home / Self Care | Payer: Commercial Managed Care - HMO | Attending: Emergency Medicine | Admitting: Emergency Medicine

## 2015-07-17 ENCOUNTER — Encounter (HOSPITAL_COMMUNITY): Payer: Self-pay | Admitting: Emergency Medicine

## 2015-07-17 ENCOUNTER — Emergency Department (HOSPITAL_COMMUNITY): Payer: Commercial Managed Care - HMO

## 2015-07-17 DIAGNOSIS — Z7982 Long term (current) use of aspirin: Secondary | ICD-10-CM | POA: Insufficient documentation

## 2015-07-17 DIAGNOSIS — I1310 Hypertensive heart and chronic kidney disease without heart failure, with stage 1 through stage 4 chronic kidney disease, or unspecified chronic kidney disease: Secondary | ICD-10-CM | POA: Diagnosis not present

## 2015-07-17 DIAGNOSIS — R609 Edema, unspecified: Secondary | ICD-10-CM

## 2015-07-17 DIAGNOSIS — G4733 Obstructive sleep apnea (adult) (pediatric): Secondary | ICD-10-CM | POA: Insufficient documentation

## 2015-07-17 DIAGNOSIS — Z794 Long term (current) use of insulin: Secondary | ICD-10-CM | POA: Insufficient documentation

## 2015-07-17 DIAGNOSIS — E119 Type 2 diabetes mellitus without complications: Secondary | ICD-10-CM | POA: Insufficient documentation

## 2015-07-17 DIAGNOSIS — Z9889 Other specified postprocedural states: Secondary | ICD-10-CM | POA: Diagnosis not present

## 2015-07-17 DIAGNOSIS — N183 Chronic kidney disease, stage 3 (moderate): Secondary | ICD-10-CM | POA: Insufficient documentation

## 2015-07-17 DIAGNOSIS — M109 Gout, unspecified: Secondary | ICD-10-CM | POA: Insufficient documentation

## 2015-07-17 DIAGNOSIS — I252 Old myocardial infarction: Secondary | ICD-10-CM | POA: Insufficient documentation

## 2015-07-17 DIAGNOSIS — R600 Localized edema: Secondary | ICD-10-CM | POA: Diagnosis not present

## 2015-07-17 DIAGNOSIS — E785 Hyperlipidemia, unspecified: Secondary | ICD-10-CM | POA: Diagnosis not present

## 2015-07-17 DIAGNOSIS — Q613 Polycystic kidney, unspecified: Secondary | ICD-10-CM | POA: Insufficient documentation

## 2015-07-17 DIAGNOSIS — M79642 Pain in left hand: Secondary | ICD-10-CM | POA: Diagnosis not present

## 2015-07-17 DIAGNOSIS — Z951 Presence of aortocoronary bypass graft: Secondary | ICD-10-CM | POA: Diagnosis not present

## 2015-07-17 DIAGNOSIS — Z7902 Long term (current) use of antithrombotics/antiplatelets: Secondary | ICD-10-CM | POA: Insufficient documentation

## 2015-07-17 DIAGNOSIS — E118 Type 2 diabetes mellitus with unspecified complications: Secondary | ICD-10-CM | POA: Diagnosis not present

## 2015-07-17 DIAGNOSIS — Z791 Long term (current) use of non-steroidal anti-inflammatories (NSAID): Secondary | ICD-10-CM | POA: Insufficient documentation

## 2015-07-17 DIAGNOSIS — Z9861 Coronary angioplasty status: Secondary | ICD-10-CM | POA: Insufficient documentation

## 2015-07-17 DIAGNOSIS — J811 Chronic pulmonary edema: Secondary | ICD-10-CM | POA: Diagnosis not present

## 2015-07-17 DIAGNOSIS — Z79899 Other long term (current) drug therapy: Secondary | ICD-10-CM | POA: Insufficient documentation

## 2015-07-17 DIAGNOSIS — Z9981 Dependence on supplemental oxygen: Secondary | ICD-10-CM | POA: Insufficient documentation

## 2015-07-17 DIAGNOSIS — K219 Gastro-esophageal reflux disease without esophagitis: Secondary | ICD-10-CM | POA: Diagnosis not present

## 2015-07-17 DIAGNOSIS — I10 Essential (primary) hypertension: Secondary | ICD-10-CM | POA: Diagnosis not present

## 2015-07-17 DIAGNOSIS — R2243 Localized swelling, mass and lump, lower limb, bilateral: Secondary | ICD-10-CM | POA: Diagnosis present

## 2015-07-17 DIAGNOSIS — I25119 Atherosclerotic heart disease of native coronary artery with unspecified angina pectoris: Secondary | ICD-10-CM | POA: Insufficient documentation

## 2015-07-17 DIAGNOSIS — M7989 Other specified soft tissue disorders: Secondary | ICD-10-CM | POA: Diagnosis not present

## 2015-07-17 LAB — CBC WITH DIFFERENTIAL/PLATELET
BASOS PCT: 0 %
Basophils Absolute: 0 10*3/uL (ref 0.0–0.1)
EOS ABS: 0.1 10*3/uL (ref 0.0–0.7)
EOS PCT: 2 %
HEMATOCRIT: 33.3 % — AB (ref 39.0–52.0)
Hemoglobin: 11 g/dL — ABNORMAL LOW (ref 13.0–17.0)
Lymphocytes Relative: 28 %
Lymphs Abs: 1.9 10*3/uL (ref 0.7–4.0)
MCH: 28.4 pg (ref 26.0–34.0)
MCHC: 33 g/dL (ref 30.0–36.0)
MCV: 86 fL (ref 78.0–100.0)
MONO ABS: 0.7 10*3/uL (ref 0.1–1.0)
MONOS PCT: 10 %
NEUTROS ABS: 4.1 10*3/uL (ref 1.7–7.7)
Neutrophils Relative %: 60 %
Platelets: 207 10*3/uL (ref 150–400)
RBC: 3.87 MIL/uL — ABNORMAL LOW (ref 4.22–5.81)
RDW: 13.7 % (ref 11.5–15.5)
WBC: 6.9 10*3/uL (ref 4.0–10.5)

## 2015-07-17 LAB — URINALYSIS, ROUTINE W REFLEX MICROSCOPIC
Bilirubin Urine: NEGATIVE
GLUCOSE, UA: 250 mg/dL — AB
HGB URINE DIPSTICK: NEGATIVE
Ketones, ur: NEGATIVE mg/dL
Leukocytes, UA: NEGATIVE
Nitrite: NEGATIVE
PROTEIN: NEGATIVE mg/dL
Specific Gravity, Urine: 1.013 (ref 1.005–1.030)
Urobilinogen, UA: 1 mg/dL (ref 0.0–1.0)
pH: 6 (ref 5.0–8.0)

## 2015-07-17 LAB — COMPREHENSIVE METABOLIC PANEL
ALBUMIN: 3.7 g/dL (ref 3.5–5.0)
ALK PHOS: 65 U/L (ref 38–126)
ALT: 20 U/L (ref 17–63)
AST: 21 U/L (ref 15–41)
Anion gap: 8 (ref 5–15)
BUN: 35 mg/dL — AB (ref 6–20)
CALCIUM: 8.9 mg/dL (ref 8.9–10.3)
CO2: 21 mmol/L — AB (ref 22–32)
CREATININE: 2.64 mg/dL — AB (ref 0.61–1.24)
Chloride: 109 mmol/L (ref 101–111)
GFR calc Af Amer: 30 mL/min — ABNORMAL LOW (ref >60)
GFR calc non Af Amer: 26 mL/min — ABNORMAL LOW (ref >60)
GLUCOSE: 213 mg/dL — AB (ref 65–99)
Potassium: 5.2 mmol/L — ABNORMAL HIGH (ref 3.5–5.1)
SODIUM: 138 mmol/L (ref 135–145)
Total Bilirubin: 0.4 mg/dL (ref 0.3–1.2)
Total Protein: 7.1 g/dL (ref 6.5–8.1)

## 2015-07-17 LAB — BRAIN NATRIURETIC PEPTIDE: B Natriuretic Peptide: 95.4 pg/mL (ref 0.0–100.0)

## 2015-07-17 LAB — TROPONIN I: Troponin I: <0.03 ng/mL (ref <0.031)

## 2015-07-17 MED ORDER — FUROSEMIDE 20 MG PO TABS: 20.0000 mg | ORAL_TABLET | Freq: Every day | ORAL | Status: DC

## 2015-07-17 MED ORDER — FUROSEMIDE 10 MG/ML IJ SOLN
40.0000 mg | INTRAMUSCULAR | Status: AC
  Administered 2015-07-17: 40 mg via INTRAVENOUS
  Filled 2015-07-17: qty 4

## 2015-07-17 NOTE — ED Notes (Signed)
MD at bedside.[name]

## 2015-07-17 NOTE — ED Provider Notes (Signed)
CSN: [id number]     Arrival date & time 07/17/15  1243 History   First MD Initiated Contact with Patient 07/17/15 1252     Chief Complaint  Patient presents with  . Leg Swelling     (Consider location/radiation/quality/duration/timing/severity/associated sxs/prior Treatment) HPI Comments: The patient is a 54 year old male, significant cardiac history with history of coronary artery bypass grafting over 10 years ago followed by several stents since that time.  he has developed shortness of breath on exertion, minimal orthopnea, difficulty with swelling bilaterally. His main complaint today is swelling in the legs. The swelling started over the last week, is gradually worsening, is now moderate, he is unable to wear his normal shoes, he also notes some swelling around his abdomen. He denies chest pain, denies fevers chills nausea vomiting or any other complaints. He has not on a Lasix however he does take Norvasc which could cause swelling   The history is provided by the patient.    Past Medical History  Diagnosis Date  . Dyslipidemia   . Coronary artery disease     a. INF MI 1999: PCI of RCA;  b.  extensive stenting of LAD;  c. s/p CABG with RIMA->RCA in 2006;  d. Stable, Low risk MV 05/2012; e. 09/2012 Cath: stable anatomy->med Rx. f. Abnl nuc 11/2013 -> med rx; g. 11/2013 Cath/PCI: LM 30d, LAD 50ost, 20/50isr, 100d, LCX 40-50ost, OM2 50ost, RI 70-80ost sup branch, RCA 20p, 90/60d(2.25x17 & 2.25x24 Promus DES'), PDA 60p, RIMA->RCA ok.  . Hypertensive heart disease   . Hx of echocardiogram     a. Echo 3/12: mild LVH, EF 55-60%, trivial AI  . Anginal pain ([place])   . Myocardial infarction [facility]) 1999; 2002; 2003; 2006; ~ 2008  . OSA on CPAP   . DM2 (diabetes mellitus, type 2) ([place])   . GERD (gastroesophageal reflux disease)   . History of gout   . CKD (chronic kidney disease), stage III   . Polycystic kidney disease    Past Surgical History  Procedure Laterality Date  . Cystectomy  1990's     off face  . Left heart catheterization with coronary/graft angiogram N/A 09/16/2012    Procedure: LEFT HEART CATHETERIZATION WITH [name];  Surgeon: [name], MD;  Location: [facility] CATH LAB;  Service: Cardiovascular;  Laterality: N/A;  . Left heart catheterization with coronary/graft angiogram N/A 12/02/2013    Procedure: LEFT HEART CATHETERIZATION WITH [name];  Surgeon: [name], MD;  Location: [place] CATH LAB;  Service: Cardiovascular;  Laterality: N/A;  . Percutaneous coronary stent intervention (pci-s) N/A 12/03/2013    Procedure: PERCUTANEOUS CORONARY STENT INTERVENTION (PCI-S);  Surgeon: [name], MD;  Location: [facility] CATH LAB;  Service: Cardiovascular;  Laterality: N/A;  . Coronary artery bypass graft  2006    CABG X?; in [place]  . Coronary angioplasty with stent placement  1999 / 2002 / 2004 / 2006?  Marland Kitchen Cardiac catheterization  2012  . Cardiac catheterization  2013  . Cardiac catheterization  2014    LHC (1/14): total occlusion of distal LAD, diffuse disease in ramus, 70% proximal LCx, 50% proximal and mid RCA, 50% distal RCA, patent RIMA-RCA.   Family History  Problem Relation Age of Onset  . Lung cancer Mother   . Anemia Mother   . Polycystic kidney disease Mother   . Diabetes Father   . Heart attack Father 60    Died suddenly  . Heart failure Father   . Hyperlipidemia Father   .  Hypertension Father   . Kidney failure Sister 5    Died  . Heart attack Sister   . Hypertension Sister   . Diabetes Sister   . Diabetes Brother   . Polycystic kidney disease Brother   . Diabetes Sister    Social History  Substance Use Topics  . Smoking status: Former Smoker    Types: Cigarettes  . Smokeless tobacco: Never Used     Comment: smoked some as a teenager (high school)  . Alcohol Use: No     Comment: 09/15/2012 "drank a little when I was young"    Review of Systems  All other systems reviewed and are negative.     Allergies   Ciprocin-fluocin-procin; Clarithromycin; Cleocin; and Glimepiride  Home Medications   Prior to Admission medications   Medication Sig Start Date End Date Taking? Authorizing Provider  acetaminophen (TYLENOL) 325 MG tablet Take 2 tablets (650 mg total) by mouth every 4 (four) hours as needed for headache or mild pain. 01/02/15  Yes Luke K Kilroy, PA-C  amLODipine (NORVASC) 10 MG tablet Take 1 tablet (10 mg total) by mouth daily. 01/12/15  Yes Gay Filler Copland, MD  aspirin 81 MG tablet Take 1 tablet (81 mg total) by mouth daily. 12/04/13  Yes Rogelia Mire, NP  carvedilol (COREG) 25 MG tablet TAKE 1 AND 1/2 TABLETS TWICE DAILY WITH MEALS 01/12/15  Yes Gay Filler Copland, MD  clopidogrel (PLAVIX) 75 MG tablet Take 1 tablet (75 mg total) by mouth daily. With breakfast. PATIENT NEEDS OFFICE VISIT FOR ADDITIONAL REFILLS 03/31/15  Yes Gay Filler Copland, MD  gabapentin (NEURONTIN) 300 MG capsule Take 1 capsule (300 mg total) by mouth 3 (three) times daily. PATIENT NEEDS OFFICE VISIT FOR ADDITIONAL REFILLS 03/31/15  Yes Gay Filler Copland, MD  ibuprofen (ADVIL,MOTRIN) 200 MG tablet Take 400 mg by mouth every 6 (six) hours as needed for moderate pain.   Yes Historical Provider, MD  Insulin Glargine (LANTUS SOLOSTAR) 100 UNIT/ML Solostar Pen INJECT 20-30 UNITS INTO THE SKIN 2 (TWO) TIMES DAILY. USUALLY TAKES 20 UNITS, IF BLOOD SUGARS ARE ELEVATED CAN TAKE UP TO 30 UNITS.  "OV NEEDED" 03/23/15  Yes Jaynee Eagles, PA-C  insulin NPH-regular Human (NOVOLIN 70/30 RELION) (70-30) 100 UNIT/ML injection Inject 15 Units into the skin 3 (three) times daily. Patient taking differently: Inject 30 Units into the skin 3 (three) times daily.  08/10/14  Yes Elayne Snare, MD  isosorbide mononitrate (IMDUR) 60 MG 24 hr tablet Take 1 tablet (60 mg total) by mouth 2 (two) times daily. 02/03/15  Yes Larey Dresser, MD  losartan (COZAAR) 100 MG tablet Take 1 tablet (100 mg total) by mouth daily. PATIENT NEEDS OFFICE VISIT FOR ADDITIONAL  REFILLS 03/31/15  Yes Gay Filler Copland, MD  pantoprazole (PROTONIX) 40 MG tablet Take 40 mg by mouth 2 (two) times daily.   Yes Historical Provider, MD  pravastatin (PRAVACHOL) 40 MG tablet TAKE 1 TABLET EVERY EVENING 12/20/14  Yes Gay Filler Copland, MD  ranolazine (RANEXA) 500 MG 12 hr tablet Take 500 mg by mouth 2 (two) times daily.   Yes Historical Provider, MD  vitamin E 400 UNIT capsule Take 400 Units by mouth daily.   Yes Historical Provider, MD  zolpidem (AMBIEN) 10 MG tablet TAKE 1 TABLET AT BEDTIME 01/05/15  Yes Darreld Mclean, MD  ACCU-CHEK SOFTCLIX LANCETS lancets Use as instructed to check blood sugar 4 times per day dx code E.11.65 03/30/15   Darreld Mclean, MD  Alcohol Swabs PADS Use as instructed to check blood sugar 4 times per day dx code E.11.65 03/30/15   Darreld Mclean, MD  B-D UF III MINI PEN NEEDLES 31G X 5 MM MISC USE ONE DAILY FOR INSULIN INJECTION 09/23/14   Darreld Mclean, MD  Blood Glucose Calibration (SURECHEK CONTROL SOLUTION) NORMAL LIQD Use as instructed to check blood sugar 4 times per day dx code E.11.65 03/30/15   Gay Filler Copland, MD  blood glucose meter kit and supplies KIT Use as instructed to check blood sugar 4 times per day dx code E.11.65 03/30/15   Darreld Mclean, MD  chlorpheniramine-HYDROcodone (TUSSIONEX PENNKINETIC ER) 10-8 MG/5ML SUER Take 5 mLs by mouth every 12 (twelve) hours as needed for cough. Patient not taking: Reported on 07/17/2015 02/15/15   Debby Freiberg, MD  colchicine 0.6 MG tablet Take 2 pills today, then may take 1 pill a day for the next 2-3 days if needed for gout Patient not taking: Reported on 07/17/2015 11/24/14   Darreld Mclean, MD  fenofibrate (TRICOR) 145 MG tablet Take 1 tablet (145 mg total) by mouth daily. Patient not taking: Reported on 07/17/2015 06/06/15   Larey Dresser, MD  furosemide (LASIX) 20 MG tablet Take 1 tablet (20 mg total) by mouth daily. 07/17/15   Noemi Chapel, MD  glucose blood (ACCU-CHEK AVIVA PLUS)  test strip Use as instructed to check blood sugar 4 times per day dx code E11.65 03/30/15   Darreld Mclean, MD  Insulin Syringe-Needle U-100 (INSULIN SYRINGE .5CC/31GX5/16") 31G X 5/16" 0.5 ML MISC Use one needle three times per day 05/26/14   Elayne Snare, MD  Multiple Vitamin (MULTIVITAMIN WITH MINERALS) TABS tablet Take 1 tablet by mouth daily.    Historical Provider, MD  naproxen sodium (ANAPROX) 220 MG tablet Take 220 mg by mouth 2 (two) times daily with a meal.    Historical Provider, MD  nitroGLYCERIN (NITROSTAT) 0.4 MG SL tablet Place 1 tablet (0.4 mg total) under the tongue every 5 (five) minutes as needed for chest pain. 11/03/14   Larey Dresser, MD  Omega-3 Fatty Acids (FISH OIL) 1200 MG CAPS Take 1 capsule (1,200 mg total) by mouth 2 (two) times daily. 02/03/15   Larey Dresser, MD   BP 144/69 mmHg  Pulse 74  Temp(Src) 98.1 F (36.7 C) (Oral)  Resp 24  Ht 5' 5.5" (1.664 m)  Wt 265 lb 14.4 oz (120.611 kg)  BMI 43.56 kg/m2  SpO2 97% Physical Exam  Constitutional: He appears well-developed and well-nourished. No distress.  HENT:  Head: Normocephalic and atraumatic.  Mouth/Throat: Oropharynx is clear and moist. No oropharyngeal exudate.  Eyes: Conjunctivae and EOM are normal. Pupils are equal, round, and reactive to light. Right eye exhibits no discharge. Left eye exhibits no discharge. No scleral icterus.  Neck: Normal range of motion. Neck supple. No JVD present. No thyromegaly present.  Cardiovascular: Normal rate, regular rhythm, normal heart sounds and intact distal pulses.  Exam reveals no gallop and no friction rub.   No murmur heard. Pulmonary/Chest: Effort normal and breath sounds normal. No respiratory distress. He has no wheezes. He has no rales.  Abdominal: Soft. Bowel sounds are normal. He exhibits no distension and no mass. There is no tenderness.  Musculoskeletal: Normal range of motion. He exhibits edema ( bilateral LE edema - pitting, symmetrical.). He exhibits no  tenderness.  Lymphadenopathy:    He has no cervical adenopathy.  Neurological: He is alert. Coordination normal.  Skin: Skin is warm and dry. No rash noted. No erythema.  Psychiatric: He has a normal mood and affect. His behavior is normal.  Nursing note and vitals reviewed.   ED Course  Procedures (including critical care time) Labs Review Labs Reviewed  COMPREHENSIVE METABOLIC PANEL - Abnormal; Notable for the following:    Potassium 5.2 (*)    CO2 21 (*)    Glucose, Bld 213 (*)    BUN 35 (*)    Creatinine, Ser 2.64 (*)    GFR calc non Af Amer 26 (*)    GFR calc Af Amer 30 (*)    All other components within normal limits  CBC WITH DIFFERENTIAL/PLATELET - Abnormal; Notable for the following:    RBC 3.87 (*)    Hemoglobin 11.0 (*)    HCT 33.3 (*)    All other components within normal limits  URINALYSIS, ROUTINE W REFLEX MICROSCOPIC (NOT AT [facility]) - Abnormal; Notable for the following:    APPearance CLOUDY (*)    Glucose, UA 250 (*)    All other components within normal limits  BRAIN NATRIURETIC PEPTIDE  TROPONIN I    Imaging Review Dg Chest 2 View  07/17/2015  CLINICAL DATA:  Swelling in legs that began 1 week ago. EXAM: CHEST  2 VIEW COMPARISON:  05/08/2015 FINDINGS: Previous median sternotomy and CABG procedure. Mild cardiac enlargement and pulmonary vascular congestion. No overt edema. No pleural effusion. IMPRESSION: 1. Mild cardiac enlargement and pulmonary vascular congestion. Electronically Signed   By: Kerby Moors M.D.   On: 07/17/2015 13:43   I have personally reviewed and evaluated these images and lab results as part of my medical decision-making.    MDM   Final diagnoses:  Peripheral edema    The patient is in no distress, he has bilateral edema, will obtain chest x-ray and labs to rule out liver as a source, renal source, cardiac source, evaluate for pulmonary edema.  Labs unremarkable - VS unremarkable - no sig pulmonary pathology and normal BNP - pt  informed of results - 1 week of lasix - has CRI so prolonged use not indicated especially if this is related to his use of Norvaxc - pt inofmred and will f/u with PCP for med management this week.  Noemi Chapel, MD 07/17/15 2031

## 2015-07-17 NOTE — Discharge Instructions (Signed)
Call your doctor in the morning as the cause of your swelling may be due to your medicine called Amlodipine - they may need to switch these medicines up and find an alternate blood pressure medicine for you  Please obtain all of your results from medical records or have your doctors office obtain the results - share them with your doctor - you should be seen at your doctors office in the next 2 days. Call today to arrange your follow up. Take the medications as prescribed. Please review all of the medicines and only take them if you do not have an allergy to them. Please be aware that if you are taking birth control pills, taking other prescriptions, ESPECIALLY ANTIBIOTICS may make the birth control ineffective - if this is the case, either do not engage in sexual activity or use alternative methods of birth control such as condoms until you have finished the medicine and your family doctor says it is OK to restart them. If you are on a blood thinner such as COUMADIN, be aware that any other medicine that you take may cause the coumadin to either work too much, or not enough - you should have your coumadin level rechecked in next 7 days if this is the case.  ?  It is also a possibility that you have an allergic reaction to any of the medicines that you have been prescribed - Everybody reacts differently to medications and while MOST people have no trouble with most medicines, you may have a reaction such as nausea, vomiting, rash, swelling, shortness of breath. If this is the case, please stop taking the medicine immediately and contact your physician.  ?  You should return to the ER if you develop severe or worsening symptoms.

## 2015-07-17 NOTE — ED Notes (Signed)
Pt states he noticed swelling in his legs that started one 1 week ago. Pt states swelling has gotten progressively worse and two days ago starting getting shortness of breath while walking. Denies any chest pain or shortness of breath while sitting.

## 2015-07-17 NOTE — ED Notes (Signed)
Pt transported to xray 

## 2015-07-18 DIAGNOSIS — I10 Essential (primary) hypertension: Secondary | ICD-10-CM | POA: Diagnosis not present

## 2015-07-18 DIAGNOSIS — E118 Type 2 diabetes mellitus with unspecified complications: Secondary | ICD-10-CM | POA: Diagnosis not present

## 2015-07-18 DIAGNOSIS — Z794 Long term (current) use of insulin: Secondary | ICD-10-CM | POA: Diagnosis not present

## 2015-07-25 ENCOUNTER — Telehealth: Payer: Self-pay | Admitting: Cardiology

## 2015-07-25 NOTE — Telephone Encounter (Signed)
Pt advised Ranexa samples left at front desk for him to pick up. Pt confirmed he has been taking Ranexa 500mg  two times a day.

## 2015-07-25 NOTE — Telephone Encounter (Signed)
New message     Patient calling the office for samples of medication:   1.  What medication and dosage are you requesting samples for? Ranexa 500 mg   2.  Are you currently out of this medication? Yes

## 2015-08-09 DIAGNOSIS — G4733 Obstructive sleep apnea (adult) (pediatric): Secondary | ICD-10-CM | POA: Diagnosis not present

## 2015-08-15 ENCOUNTER — Other Ambulatory Visit (INDEPENDENT_AMBULATORY_CARE_PROVIDER_SITE_OTHER): Payer: Commercial Managed Care - HMO | Admitting: *Deleted

## 2015-08-15 DIAGNOSIS — E785 Hyperlipidemia, unspecified: Secondary | ICD-10-CM | POA: Diagnosis not present

## 2015-08-15 LAB — LIPID PANEL
Cholesterol: 180 mg/dL (ref 125–200)
HDL: 27 mg/dL — AB (ref >=40)
LDL CALC: 93 mg/dL (ref <130)
Total CHOL/HDL Ratio: 6.7 Ratio — ABNORMAL HIGH (ref <=5.0)
Triglycerides: 298 mg/dL — ABNORMAL HIGH (ref <150)
VLDL: 60 mg/dL — ABNORMAL HIGH (ref <30)

## 2015-08-15 NOTE — Addendum Note (Signed)
Addended by: Eulis Foster on: 08/15/2015 09:40 AM   Modules accepted: Orders

## 2015-08-16 ENCOUNTER — Other Ambulatory Visit: Payer: Self-pay | Admitting: *Deleted

## 2015-08-16 DIAGNOSIS — E785 Hyperlipidemia, unspecified: Secondary | ICD-10-CM

## 2015-08-31 ENCOUNTER — Telehealth: Payer: Self-pay | Admitting: Cardiology

## 2015-08-31 NOTE — Telephone Encounter (Signed)
New Message  Patient calling the office for samples of medication:   1.  What medication and dosage are you requesting samples for?Ranexa - 500  2.  Are you currently out of this medication? "couple days left"

## 2015-08-31 NOTE — Telephone Encounter (Signed)
Samples placed up front, pt aware

## 2015-09-07 DIAGNOSIS — E1169 Type 2 diabetes mellitus with other specified complication: Secondary | ICD-10-CM | POA: Diagnosis not present

## 2015-09-07 DIAGNOSIS — E118 Type 2 diabetes mellitus with unspecified complications: Secondary | ICD-10-CM | POA: Diagnosis not present

## 2015-09-07 DIAGNOSIS — Z0000 Encounter for general adult medical examination without abnormal findings: Secondary | ICD-10-CM | POA: Diagnosis not present

## 2015-09-07 DIAGNOSIS — I10 Essential (primary) hypertension: Secondary | ICD-10-CM | POA: Diagnosis not present

## 2015-09-07 DIAGNOSIS — Z794 Long term (current) use of insulin: Secondary | ICD-10-CM | POA: Diagnosis not present

## 2015-09-07 DIAGNOSIS — E785 Hyperlipidemia, unspecified: Secondary | ICD-10-CM | POA: Diagnosis not present

## 2015-09-08 DIAGNOSIS — G4733 Obstructive sleep apnea (adult) (pediatric): Secondary | ICD-10-CM | POA: Diagnosis not present

## 2015-09-15 ENCOUNTER — Telehealth: Payer: Self-pay | Admitting: Cardiology

## 2015-09-15 NOTE — Telephone Encounter (Signed)
I want to verify meant to say pt can take Ranexa 1000 mg BID, is so do you want to keep him at this dose or just until samples available for 500mg  again?

## 2015-09-15 NOTE — Telephone Encounter (Signed)
He can stay at 1000 mg bid if he thinks more effective than 500 bid.

## 2015-09-15 NOTE — Telephone Encounter (Signed)
Pt called to see if we have Ranexa 500 mg samples 12 H . Pt takes that medication twice a day. Pt is aware that we have 1000 mg Ranexa samples, but  according to [facility] ; this medication can't be cut in half. Pt would like to know if there is an alternative to this medication, because this med is hard to find.

## 2015-09-15 NOTE — Telephone Encounter (Signed)
Called pt and left message informing pt that we did not have any samples of Ranexa 500 mg tablets, but pt could call back next week to see if we have gotten any samples in at that time. I advised the pt that if he has any other problems, questions or concerns to call the office.

## 2015-09-15 NOTE — Telephone Encounter (Signed)
New Message  Pt c/o medication issue: 1. Name of Medication: Renexa   4. What is your medication issue? Pt called for samples of renex. Req a call back to discuss an alternative medication. Because it is hard to get refills for ranolazine (RANEXA) 500 MG 12 hr tablet

## 2015-09-15 NOTE — Telephone Encounter (Signed)
He can take the Ranexa 1000 mg bid or he can see if [facility] has the 500 mg dose.

## 2015-09-15 NOTE — Telephone Encounter (Signed)
New message      Patient calling the office for samples of medication:   1.  What medication and dosage are you requesting samples for? ranexa 500mg   2.  Are you currently out of this medication? Have a few pills left

## 2015-09-15 NOTE — Telephone Encounter (Signed)
Pt contacted and aware of dose change and samples left at front desk for pick up.

## 2015-09-15 NOTE — Telephone Encounter (Signed)
Samples placed at the front desk of Ranexa 1000 mg tablets.

## 2015-10-20 ENCOUNTER — Other Ambulatory Visit (INDEPENDENT_AMBULATORY_CARE_PROVIDER_SITE_OTHER): Payer: Medicare Other | Admitting: *Deleted

## 2015-10-20 DIAGNOSIS — E785 Hyperlipidemia, unspecified: Secondary | ICD-10-CM | POA: Diagnosis not present

## 2015-10-20 LAB — HEPATIC FUNCTION PANEL
ALK PHOS: 62 U/L (ref 40–115)
ALT: 12 U/L (ref 9–46)
AST: 13 U/L (ref 10–35)
Albumin: 4.1 g/dL (ref 3.6–5.1)
BILIRUBIN DIRECT: 0.1 mg/dL (ref <=0.2)
BILIRUBIN INDIRECT: 0.2 mg/dL (ref 0.2–1.2)
Total Bilirubin: 0.3 mg/dL (ref 0.2–1.2)
Total Protein: 7.3 g/dL (ref 6.1–8.1)

## 2015-10-20 LAB — LIPID PANEL
CHOL/HDL RATIO: 8.1 ratio — AB (ref <=5.0)
CHOLESTEROL: 187 mg/dL (ref 125–200)
HDL: 23 mg/dL — ABNORMAL LOW (ref >=40)
Triglycerides: 604 mg/dL — ABNORMAL HIGH (ref <150)

## 2015-10-20 NOTE — Addendum Note (Signed)
Addended by: Eulis Foster on: 10/20/2015 07:55 AM   Modules accepted: Orders

## 2015-10-26 ENCOUNTER — Telehealth: Payer: Self-pay | Admitting: *Deleted

## 2015-10-26 DIAGNOSIS — E785 Hyperlipidemia, unspecified: Secondary | ICD-10-CM

## 2015-10-26 MED ORDER — PRAVASTATIN SODIUM 80 MG PO TABS: 80.0000 mg | ORAL_TABLET | Freq: Every evening | ORAL | Status: DC

## 2015-10-26 MED ORDER — FENOFIBRATE 145 MG PO TABS: 145.0000 mg | ORAL_TABLET | Freq: Every day | ORAL | Status: DC

## 2015-10-26 NOTE — Telephone Encounter (Signed)
Spoke with pt and reviewed lab results and recommendation from Dr. Aundra Dubin with him.  He has fenofibrate on his med list but note indicates he has not been taking.  I instructed him to start this medication. Also needs pravastatin sent to pharmacy.  Will send these medications to [facility] on [address]. He is asking for samples of Ranexa. Per last phone note he can take 1000 mg if this dose works better for him. Pt states 1000 mg works better than 500.  I told him I would leave samples at front desk for him to pick up.  Ranexa 1000 mg, 28 tablets, Lot [id number], exp 2/20 left at front desk.

## 2015-12-05 ENCOUNTER — Other Ambulatory Visit: Payer: Self-pay | Admitting: Cardiology

## 2015-12-14 ENCOUNTER — Telehealth: Payer: Self-pay | Admitting: Cardiology

## 2015-12-14 ENCOUNTER — Other Ambulatory Visit: Payer: Self-pay | Admitting: *Deleted

## 2015-12-14 MED ORDER — RANOLAZINE ER 500 MG PO TB12: 500.0000 mg | ORAL_TABLET | Freq: Two times a day (BID) | ORAL | Status: DC

## 2015-12-14 NOTE — Telephone Encounter (Signed)
Samples, Patient Assistance Application, and Ranexa Copay Card placed up front for patient.

## 2015-12-14 NOTE — Telephone Encounter (Signed)
New message  ° ° ° °Patient calling the office for samples of medication: ° ° °1.  What medication and dosage are you requesting samples for? ranexa 500 mg  ° °2.  Are you currently out of this medication? Yes  ° ° °

## 2015-12-22 ENCOUNTER — Other Ambulatory Visit (INDEPENDENT_AMBULATORY_CARE_PROVIDER_SITE_OTHER): Payer: Medicare Other | Admitting: *Deleted

## 2015-12-22 DIAGNOSIS — E785 Hyperlipidemia, unspecified: Secondary | ICD-10-CM | POA: Diagnosis not present

## 2015-12-22 LAB — LIPID PANEL
Cholesterol: 159 mg/dL (ref 125–200)
HDL: 32 mg/dL — ABNORMAL LOW (ref >=40)
LDL CALC: 72 mg/dL (ref <130)
TRIGLYCERIDES: 277 mg/dL — AB (ref <150)
Total CHOL/HDL Ratio: 5 Ratio (ref <=5.0)
VLDL: 55 mg/dL — AB (ref <30)

## 2016-01-16 ENCOUNTER — Telehealth: Payer: Self-pay | Admitting: Cardiology

## 2016-01-16 NOTE — Telephone Encounter (Signed)
Pt states he took his last dose of Ranexa today.  Pt states he has [facility] now, Ranexa is a Tier 4 drug, the co- pay is too much. Pt asking if there is a less expensive alternative to Ranexa.  Phone note from 12/14/15 indicates patient assistance application for Ranexa with samples left at front desk for pt, pt states he does not recall completing patient assistance application in the past.  Pt advised I will forward to Kooskia R to see if she can help with patient assistance for Ranexa.  Pt advised I will forward to Dr Aundra Dubin for review.   Pt advised samples of Ranexa 500mg  left at front desk of the [facility] for pt to pick up today.

## 2016-01-16 NOTE — Telephone Encounter (Signed)
Renexa Samples left at front desk

## 2016-01-16 NOTE — Telephone Encounter (Signed)
New Message:  Pt's wife called in stating that the Ranexa prescription is too expensive and would like to know if there is another medication that he can take the is more affordable. Please f/u with her

## 2016-01-16 NOTE — Telephone Encounter (Signed)
Informed Matthew Rowe that pt will be coming to [facility] to pick up 30 days worth of Renexa 500mg  samples.

## 2016-01-23 ENCOUNTER — Ambulatory Visit (INDEPENDENT_AMBULATORY_CARE_PROVIDER_SITE_OTHER): Payer: Medicare Other | Admitting: Cardiology

## 2016-01-23 ENCOUNTER — Encounter: Payer: Self-pay | Admitting: Cardiology

## 2016-01-23 VITALS — BP 130/80 | HR 83 | Ht 65.5 in | Wt 250.0 lb

## 2016-01-23 DIAGNOSIS — I5032 Chronic diastolic (congestive) heart failure: Secondary | ICD-10-CM

## 2016-01-23 DIAGNOSIS — I2510 Atherosclerotic heart disease of native coronary artery without angina pectoris: Secondary | ICD-10-CM

## 2016-01-23 DIAGNOSIS — Q613 Polycystic kidney, unspecified: Secondary | ICD-10-CM | POA: Diagnosis not present

## 2016-01-23 MED ORDER — NITROGLYCERIN 0.4 MG/SPRAY TL SOLN: 1.0000 | Status: DC | PRN

## 2016-01-23 MED ORDER — LOSARTAN POTASSIUM 100 MG PO TABS: 100.0000 mg | ORAL_TABLET | Freq: Every day | ORAL | Status: DC

## 2016-01-23 MED ORDER — CLOPIDOGREL BISULFATE 75 MG PO TABS: 75.0000 mg | ORAL_TABLET | Freq: Every day | ORAL | Status: DC

## 2016-01-23 NOTE — Patient Instructions (Signed)
Medication Instructions:  Your physician recommends that you continue on your current medications as directed. Please refer to the Current Medication list given to you today.   Labwork: BMET today.  Testing/Procedures: none  Follow-Up: Your physician wants you to follow-up in: 6 months with Dr Aundra Dubin. (November 2017).  You will receive a reminder letter in the mail two months in advance. If you don't receive a letter, please call our office to schedule the follow-up appointment.        If you need a refill on your cardiac medications before your next appointment, please call your pharmacy.

## 2016-01-24 ENCOUNTER — Telehealth: Payer: Self-pay | Admitting: *Deleted

## 2016-01-24 DIAGNOSIS — R899 Unspecified abnormal finding in specimens from other organs, systems and tissues: Secondary | ICD-10-CM

## 2016-01-24 LAB — BASIC METABOLIC PANEL
BUN: 35 mg/dL — ABNORMAL HIGH (ref 7–25)
CALCIUM: 8.7 mg/dL (ref 8.6–10.3)
CHLORIDE: 105 mmol/L (ref 98–110)
CO2: 15 mmol/L — AB (ref 20–31)
CREATININE: 2.72 mg/dL — AB (ref 0.70–1.33)
GLUCOSE: 191 mg/dL — AB (ref 65–99)
Potassium: 6.2 mmol/L (ref 3.5–5.3)
SODIUM: 136 mmol/L (ref 135–146)

## 2016-01-24 MED ORDER — HYDRALAZINE HCL 25 MG PO TABS: 25.0000 mg | ORAL_TABLET | Freq: Three times a day (TID) | ORAL | Status: DC

## 2016-01-24 NOTE — Telephone Encounter (Signed)
He will need to hold his losartan.  Start hydralazine 25 mg tid.  He will need repeat BMET asap.  Needs to have appt with his nephrologist also.  Have him get his BP checked daily off losartan and on hydralazine and call him to get BP numbers next week.

## 2016-01-24 NOTE — Telephone Encounter (Signed)
Pt states he drank a lot of cranberry juice on Sunday, pt states the label on the cranberry juice says it contains 12% potassium. Pt states he feels his potassium is elevated because of the cranberry juice.  Pt did confirm he cannot come for repeat BMET until tomorrow morning. Pt asked that I make Dr Aundra Dubin aware of the cranberry juice before he made any medication changes.  Pt states Dr Florene Glen is his nephrologist, he will call and schedule a follow up appointment with him.  Pt advised I will forward to Dr Aundra Dubin for review.

## 2016-01-24 NOTE — Telephone Encounter (Signed)
Pt's wife advised Dr Aundra Dubin recommended pt hold losartan, start hydralazine 25mg  tid, check BP daily, will get BP numbers in 1 week. Pt advised not to drink any more cranberry juice. Pt's wife verbalized understanding, states pt will be here in the morning for repeat BMET, will call Dr Florene Glen, nephrologist for follow up appt.

## 2016-01-24 NOTE — Telephone Encounter (Signed)
Call from [place] from [facility] with critical potassium of 6.2. These results are in EPIC with a comment :  Comments: Result repeated and verified.  No visible hemolysis.  Patient serum had prolonged contact with red cells. Integrity of  results may be affected.  ASKED CALLER ABOUT THIS--SHE STATES THE SPECIMEN MAY HAVE SAT TOO LONG WITHOUT BEING SPUN AND RECOMMENDS RECOLLECTION.  Called patient and asked him to return today for recollect; he cannot--he has a full schedule today.  He is able to come tomorrow.  He states that he drank "a supersize cup of cranberry juice" before he came for labs.  He is not ordered potassium supplement. Advised that should not raise his K+ to critical level.  Pt is aware I am forwarding to Dr. Aundra Dubin to inform that he will come on Wednesday to recollect and will call back if any further recommendations. Reviewed with Dr. Claris Gladden nurse who also sent staff message to MD today regarding lab result.

## 2016-01-24 NOTE — Progress Notes (Signed)
Patient ID: Matthew Hensch., male   DOB: 03-26-1961, 55 y.o.   MRN: [id number] PCP: Dr. Lorelei Pont  55 yo with history CAD s/p CABG (RIMA to RCA, anomalous RCA off left cusp), occlusion of the distal LAD, and chronic angina presents for cardiology followup.  Patient has had chronic angina for years. In 1/14, he developed progressive angina and was hospitalized.  LHC was done, showing unchanged anatomy.  RIMA-RCA was patent.  He did well after this on ranolazine.  In 4/14, he ran out of ranolazine and the chest pain returned. He was admitted overnight in 4/14 and ruled out for MI.  In 7/14, he had again run low on ranolazine and developed more chest pain.  He was admitted again and ruled out for MI.  In 3/15, he was admitted with recurrent chest pain.  LHC was done, showing 90% distal RCA stenosis after RIMA insertion.  He had Promus DES x 2 to distal RCA.  He ran out of amlodipine and losartan in 1/16 and developed chest pain in the setting of elevated BP. He was admitted and ruled out for MI.  Chest pain resolved with BP control. He was admitted again in 4/16 with a hypertensive urgency after running out of his meds.  Last echo in 4/16 showed EF 50-55% and mild AS.  He was admitted in 8/16 when he had chest pain after lifting a heavy load.  He was ruled out for MI and sent home.    He is now on all his medications.  He is at baseline.  No exertional chest pain, only getting chest discomfort "after over-eating."  He has not had to use NTG recently.  No significant exertional dyspnea walking on flat ground.  Walking for exercise.  No palpitations, orthopnea, PND.  He is using CPAP.  Weight is down 13 lbs.    Of note, K was 6.2 on labs done after today's appt.   Labs (7/13): K 4.9, creatinine 1.75, LDL 63 Labs (1/14): K 4.2, creatinine 1.65, TGs 483 (could not calculate LDL) Labs (4/14): K 4.8, creatinine 1.62, HCT 36.8 Labs (7/14): K 4.3, creatinine 1.63, LDL 56, HDL 34, TGs 382 Labs (1/15): K 4.6,  creatinine 2 Labs (3/15): TGs 605, LDL unable to calculate Labs (5/15): K 4.7, creatinine 2.2 Labs (7/15): K 4.4, creatinine 2.2, LDL 51, HDL 32, TGs 983 Labs (1/16): TGs 524, unable to calculate LDL Labs (2/16): K 4.7, creatinine 1.98 Labs (4/16): K 4.4, creatinine 2.19, HCT 34.6, TGs 526, unable to calculate LDL Labs (8/16): BNP 57, K 4.2, creatinine 2.67 Labs (11/16): K 5.2, creatinine 2.64 Labs (4/17): LDL 72, HDL 32 Labs (5/17): K 6.2, creatinine 2.72  PMH: 1. CAD: CABG 2006 at [facility] with [place].  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  Patient has had prior extensive PCI to LAD with occlusion of the distal LAD.  LHC (7/12): proximal LAD stent, extensive mid-distal LAD stenting, 60-70% mid LAD instent restenosis, subtotal distal LAD instent restenosis, LCFx without significant disease, anomalous RCA off left cusp with mild disease, patent RIMA-RCA.  Echo (3/12): EF 55-60%, trivial AI, mild LVH.  Lexiscan myoview (9/13) with small apical scar, moderate inferior scar from base to apex with some reversibility, EF 47% with apical hypokinesis (similar to prior study).  Chronic angina.  LHC (1/14): total occlusion of distal LAD, diffuse disease in ramus, 70% proximal LCx, 50% proximal and mid RCA, 50% distal RCA, patent RIMA-RCA.   LHC (3/15)  with multiple stents in the LAD and 50% ostial LAD stenosis, total occlusion distal LAD, 40-50% ostial LCx stenosis, 90% distal RCA stenosis after RIMA insertion.  Patient had DES x 2 to distal RCA.  2. OSA: CPAP. 3. Type II diabetes 4. Hyperlipidemia: had side effects from Lipitor. Very high TGs.  5. HTN 6. CKD: polycystic kidney disease.  7. Ischemic cardiomyopathy: EF 55-60% by echo in 3/12, EF 47% with apical hypokinesis in 9/13.  Echo (4/16) with EF 50-55%, mild LVH, mild AS with mean gradient 14 mmHg.  8. Gout 9. Aortic stenosis: Mild.   SH: Nonsmoker.  Married, lives in [place].  On disability.   FH: Multiple  family members with MIs.   ROS: All systems reviewed and negative except as per HPI.   Current Outpatient Prescriptions  Medication Sig Dispense Refill  . acetaminophen (TYLENOL) 325 MG tablet Take 2 tablets (650 mg total) by mouth every 4 (four) hours as needed for headache or mild pain.    Marland Kitchen amLODipine (NORVASC) 10 MG tablet Take 1 tablet (10 mg total) by mouth daily. 90 tablet 3  . aspirin 81 MG tablet Take 1 tablet (81 mg total) by mouth daily.    . carvedilol (COREG) 25 MG tablet TAKE 1 AND 1/2 TABLETS TWICE DAILY WITH MEALS 270 tablet 3  . clopidogrel (PLAVIX) 75 MG tablet Take 1 tablet (75 mg total) by mouth daily. With breakfast. 90 tablet 1  . fenofibrate (TRICOR) 145 MG tablet Take 1 tablet (145 mg total) by mouth daily. 30 tablet 11  . furosemide (LASIX) 20 MG tablet Take 1 tablet (20 mg total) by mouth daily. 7 tablet 0  . gabapentin (NEURONTIN) 300 MG capsule Take 1 capsule (300 mg total) by mouth 3 (three) times daily. PATIENT NEEDS OFFICE VISIT FOR ADDITIONAL REFILLS 270 capsule 0  . ibuprofen (ADVIL,MOTRIN) 200 MG tablet Take 400 mg by mouth every 6 (six) hours as needed for moderate pain.    . isosorbide mononitrate (IMDUR) 60 MG 24 hr tablet Take 1 tablet (60 mg total) by mouth 2 (two) times daily. 90 tablet 3  . losartan (COZAAR) 100 MG tablet Take 1 tablet (100 mg total) by mouth daily. 90 tablet 1  . Multiple Vitamin (MULTIVITAMIN WITH MINERALS) TABS tablet Take 1 tablet by mouth daily.    . naproxen sodium (ANAPROX) 220 MG tablet Take 220 mg by mouth 2 (two) times daily with a meal.    . Omega-3 Fatty Acids (FISH OIL) 1200 MG CAPS Take 1 capsule (1,200 mg total) by mouth 2 (two) times daily.    . pantoprazole (PROTONIX) 40 MG tablet Take 40 mg by mouth 2 (two) times daily.    . pravastatin (PRAVACHOL) 80 MG tablet Take 1 tablet (80 mg total) by mouth every evening. 30 tablet 11  . ranolazine (RANEXA) 500 MG 12 hr tablet Take 1 tablet (500 mg total) by mouth 2 (two) times  daily. 60 tablet 11  . vitamin E 400 UNIT capsule Take 400 Units by mouth daily.    Marland Kitchen zolpidem (AMBIEN) 10 MG tablet TAKE 1 TABLET AT BEDTIME 90 tablet 0  . nitroGLYCERIN (NITROLINGUAL) 0.4 MG/SPRAY spray Place 1 spray under the tongue every 5 (five) minutes x 3 doses as needed for chest pain. 12 g 12   No current facility-administered medications for this visit.    BP 130/80 mmHg  Pulse 83  Ht 5' 5.5" (1.664 m)  Wt 250 lb (113.399 kg)  BMI 40.95 kg/m2 General:  NAD, obese.  Neck: Thick, no JVD, no thyromegaly or thyroid nodule.  Lungs: Clear to auscultation bilaterally with normal respiratory effort. CV: Nondisplaced PMI.  Heart regular S1/S2, no S3/S4, 1/6 SEM RUSB.  No peripheral edema.  No carotid bruit.  Normal pedal pulses.  Abdomen: Soft, nontender, no hepatosplenomegaly, no distention.  Neurologic: Alert and oriented x 3.  Psych: Normal affect. Extremities: No clubbing or cyanosis.   Assessment/Plan: 1. CAD:  Minimal chest pain on his anti-anginals.  - Continue ASA 81 and Plavix long-term. - Continue statin, Imdur, amlodipine, ranolazine, Coreg.    2. HTN: Controlled as long as he stays on his meds. 3. Hyperlipidemia: Good lipids in 4/17.   4. CKD: Patient has APKD.  He has not seen renal recently.  CKD stage III-IV.  Today's labs came back with K 6.2 and creatinine stable at 2.64.  He was asked to come back for repeat BMET stat to confirm but cannot until tomorrow.  We will have him stop losartan and start hydralazine 25 mg tid for BP control.  He will need to follow low K diet and will need renal followup soon.  5. OSA: Continue CPAP.  6. Obesity: Exercising and losing weight.    [name] 01/24/2016

## 2016-01-25 ENCOUNTER — Other Ambulatory Visit: Payer: Self-pay | Admitting: Cardiology

## 2016-01-25 ENCOUNTER — Other Ambulatory Visit (INDEPENDENT_AMBULATORY_CARE_PROVIDER_SITE_OTHER): Payer: Medicare Other | Admitting: *Deleted

## 2016-01-25 DIAGNOSIS — R899 Unspecified abnormal finding in specimens from other organs, systems and tissues: Secondary | ICD-10-CM | POA: Diagnosis not present

## 2016-01-25 LAB — BASIC METABOLIC PANEL
BUN: 39 mg/dL — ABNORMAL HIGH (ref 7–25)
CALCIUM: 8.7 mg/dL (ref 8.6–10.3)
CO2: 17 mmol/L — AB (ref 20–31)
Chloride: 110 mmol/L (ref 98–110)
Creat: 2.53 mg/dL — ABNORMAL HIGH (ref 0.70–1.33)
GLUCOSE: 127 mg/dL — AB (ref 65–99)
Potassium: 5 mmol/L (ref 3.5–5.3)
SODIUM: 137 mmol/L (ref 135–146)

## 2016-01-25 NOTE — Telephone Encounter (Signed)
Rx request sent to pharmacy.  

## 2016-02-02 ENCOUNTER — Telehealth: Payer: Self-pay | Admitting: *Deleted

## 2016-02-02 NOTE — Telephone Encounter (Signed)
Those numbers are good.

## 2016-02-02 NOTE — Telephone Encounter (Signed)
This is copied from lab results done 01/23/16.  He will need to hold his losartan. Start hydralazine 25 mg tid. He will need repeat BMET asap. Needs to have appt with his nephrologist also. Have him get his BP checked daily off losartan and on hydralazine and call him to get BP numbers next week.   02/02/16 Pt states BP has been running in the 120/80 range since making medication change.

## 2016-02-14 ENCOUNTER — Telehealth: Payer: Self-pay | Admitting: *Deleted

## 2016-02-14 NOTE — Telephone Encounter (Signed)
Pt states insurance company is requesting tier exception for Ranexa prescribed for stable chronic angina I20.8. [phone], ID# [id number]. Group number [id number].  Pt advised samples for Ranexa have been left at desk for pick up.  I spoke with [place] at [facility]. Call disconnected, unable to continue.  I called again, spoke with [facility]. Gwynne Edinger states request for tier exception has been sent to health insurance for review, expect a response by fax in 24-48 hours.

## 2016-02-22 NOTE — Telephone Encounter (Signed)
I spoke with Toneisha at [facility]--request for tier exception for Ranexa has been denied, she was unable to explain to me why the tier exception was denied. I asked if Dr Aundra Dubin could provide other information to appeal denial for tier exception and she said no, the request had been denied.  I attempted to contact pt to let him know request denied, I was unable to leave a message, mailbox full.

## 2016-03-20 ENCOUNTER — Telehealth: Payer: Self-pay | Admitting: Cardiology

## 2016-03-20 ENCOUNTER — Telehealth: Payer: Self-pay

## 2016-03-20 NOTE — Telephone Encounter (Signed)
Returned call to patient. Medication Samples have been left at the front desk for the patient to pick up.  Drug name: ranexa       Strength: 500 mg        Qty: 4 boxes (56 tablets)  LOT: [id number]  Exp.Date: 10/2018  Dosing instructions: Ranexa 500mg , 1 tablet by mouth two times per day.  The patient has been instructed regarding the correct time, dose, and frequency of taking this medication, including desired effects and most common side effects.   Roney Jaffe 11:27 AM 03/20/2016

## 2016-03-20 NOTE — Telephone Encounter (Signed)
I spoke with Anderson Malta at [facility]. They have samples of Ranexa, will contact pt to let him know they have samples for him to pick up.

## 2016-03-20 NOTE — Telephone Encounter (Signed)
New message      Patient calling the office for samples of medication:   1.  What medication and dosage are you requesting samples for? ranexa 500mg   2.  Are you currently out of this medication? almost out  Pt states [address] office is out of samples and told pt to call [facility] office to see if they had samples

## 2016-03-20 NOTE — Telephone Encounter (Signed)
New message       Patient calling the office for samples of medication:   1.  What medication and dosage are you requesting samples for? ranexa 500mg   2.  Are you currently out of this medication? Almost out of rx

## 2016-03-24 ENCOUNTER — Encounter (HOSPITAL_COMMUNITY): Payer: Self-pay

## 2016-03-24 ENCOUNTER — Observation Stay (HOSPITAL_COMMUNITY)
Admission: EM | Admit: 2016-03-24 | Discharge: 2016-03-25 | Disposition: A | Discharge status: Home / Self Care | Payer: Medicare Other | Attending: Cardiology | Admitting: Cardiology

## 2016-03-24 ENCOUNTER — Emergency Department (HOSPITAL_COMMUNITY): Payer: Medicare Other

## 2016-03-24 DIAGNOSIS — R072 Precordial pain: Secondary | ICD-10-CM | POA: Diagnosis present

## 2016-03-24 DIAGNOSIS — Z79899 Other long term (current) drug therapy: Secondary | ICD-10-CM | POA: Insufficient documentation

## 2016-03-24 DIAGNOSIS — M79674 Pain in right toe(s): Secondary | ICD-10-CM | POA: Diagnosis not present

## 2016-03-24 DIAGNOSIS — R079 Chest pain, unspecified: Secondary | ICD-10-CM | POA: Diagnosis not present

## 2016-03-24 DIAGNOSIS — R9431 Abnormal electrocardiogram [ECG] [EKG]: Secondary | ICD-10-CM | POA: Diagnosis not present

## 2016-03-24 DIAGNOSIS — Z951 Presence of aortocoronary bypass graft: Secondary | ICD-10-CM | POA: Insufficient documentation

## 2016-03-24 DIAGNOSIS — N183 Chronic kidney disease, stage 3 (moderate): Secondary | ICD-10-CM | POA: Diagnosis not present

## 2016-03-24 DIAGNOSIS — I208 Other forms of angina pectoris: Secondary | ICD-10-CM | POA: Diagnosis not present

## 2016-03-24 DIAGNOSIS — Z7982 Long term (current) use of aspirin: Secondary | ICD-10-CM | POA: Diagnosis not present

## 2016-03-24 DIAGNOSIS — E1122 Type 2 diabetes mellitus with diabetic chronic kidney disease: Secondary | ICD-10-CM | POA: Diagnosis not present

## 2016-03-24 DIAGNOSIS — M10371 Gout due to renal impairment, right ankle and foot: Secondary | ICD-10-CM

## 2016-03-24 DIAGNOSIS — I2510 Atherosclerotic heart disease of native coronary artery without angina pectoris: Secondary | ICD-10-CM | POA: Diagnosis not present

## 2016-03-24 DIAGNOSIS — I1310 Hypertensive heart and chronic kidney disease without heart failure, with stage 1 through stage 4 chronic kidney disease, or unspecified chronic kidney disease: Secondary | ICD-10-CM | POA: Diagnosis not present

## 2016-03-24 DIAGNOSIS — I252 Old myocardial infarction: Secondary | ICD-10-CM | POA: Diagnosis not present

## 2016-03-24 DIAGNOSIS — I10 Essential (primary) hypertension: Secondary | ICD-10-CM

## 2016-03-24 DIAGNOSIS — I209 Angina pectoris, unspecified: Secondary | ICD-10-CM | POA: Diagnosis not present

## 2016-03-24 DIAGNOSIS — I25708 Atherosclerosis of coronary artery bypass graft(s), unspecified, with other forms of angina pectoris: Secondary | ICD-10-CM | POA: Diagnosis not present

## 2016-03-24 LAB — GLUCOSE, CAPILLARY
GLUCOSE-CAPILLARY: 141 mg/dL — AB (ref 65–99)
GLUCOSE-CAPILLARY: 155 mg/dL — AB (ref 65–99)

## 2016-03-24 LAB — I-STAT TROPONIN, ED: Troponin i, poc: 0.01 ng/mL (ref 0.00–0.08)

## 2016-03-24 LAB — CBC
HCT: 33.4 % — ABNORMAL LOW (ref 39.0–52.0)
Hemoglobin: 11 g/dL — ABNORMAL LOW (ref 13.0–17.0)
MCH: 28.6 pg (ref 26.0–34.0)
MCHC: 32.9 g/dL (ref 30.0–36.0)
MCV: 86.8 fL (ref 78.0–100.0)
PLATELETS: 231 10*3/uL (ref 150–400)
RBC: 3.85 MIL/uL — ABNORMAL LOW (ref 4.22–5.81)
RDW: 13 % (ref 11.5–15.5)
WBC: 6.2 10*3/uL (ref 4.0–10.5)

## 2016-03-24 LAB — BASIC METABOLIC PANEL
Anion gap: 7 (ref 5–15)
BUN: 26 mg/dL — AB (ref 6–20)
CHLORIDE: 112 mmol/L — AB (ref 101–111)
CO2: 19 mmol/L — ABNORMAL LOW (ref 22–32)
CREATININE: 2.36 mg/dL — AB (ref 0.61–1.24)
Calcium: 9.4 mg/dL (ref 8.9–10.3)
GFR calc Af Amer: 34 mL/min — ABNORMAL LOW (ref >60)
GFR, EST NON AFRICAN AMERICAN: 29 mL/min — AB (ref >60)
Glucose, Bld: 118 mg/dL — ABNORMAL HIGH (ref 65–99)
Potassium: 5.1 mmol/L (ref 3.5–5.1)
SODIUM: 138 mmol/L (ref 135–145)

## 2016-03-24 LAB — TROPONIN I: Troponin I: <0.03 ng/mL (ref <0.03)

## 2016-03-24 MED ORDER — COLCHICINE 0.6 MG PO TABS
0.6000 mg | ORAL_TABLET | Freq: Two times a day (BID) | ORAL | Status: DC
  Administered 2016-03-24 – 2016-03-25 (×2): 0.6 mg via ORAL
  Filled 2016-03-24 (×3): qty 1

## 2016-03-24 MED ORDER — ACETAMINOPHEN 500 MG PO TABS: 1000.0000 mg | ORAL_TABLET | Freq: Three times a day (TID) | ORAL | Status: DC | PRN

## 2016-03-24 MED ORDER — LOSARTAN POTASSIUM 50 MG PO TABS
100.0000 mg | ORAL_TABLET | Freq: Every day | ORAL | Status: DC
  Filled 2016-03-24: qty 2

## 2016-03-24 MED ORDER — CARVEDILOL 25 MG PO TABS
50.0000 mg | ORAL_TABLET | Freq: Two times a day (BID) | ORAL | Status: DC
  Administered 2016-03-24 – 2016-03-25 (×2): 50 mg via ORAL
  Filled 2016-03-24 (×3): qty 2

## 2016-03-24 MED ORDER — SODIUM CHLORIDE 0.9 % IV SOLN: 250.0000 mL | INTRAVENOUS | Status: DC | PRN

## 2016-03-24 MED ORDER — PRAVASTATIN SODIUM 40 MG PO TABS: 80.0000 mg | ORAL_TABLET | Freq: Every evening | ORAL | Status: DC

## 2016-03-24 MED ORDER — GABAPENTIN 300 MG PO CAPS
300.0000 mg | ORAL_CAPSULE | Freq: Three times a day (TID) | ORAL | Status: DC
  Administered 2016-03-24 – 2016-03-25 (×3): 300 mg via ORAL
  Filled 2016-03-24 (×3): qty 1

## 2016-03-24 MED ORDER — METFORMIN HCL 500 MG PO TABS
250.0000 mg | ORAL_TABLET | Freq: Every day | ORAL | Status: DC
  Filled 2016-03-24: qty 1

## 2016-03-24 MED ORDER — SODIUM CHLORIDE 0.9% FLUSH: 3.0000 mL | INTRAVENOUS | Status: DC | PRN

## 2016-03-24 MED ORDER — AMLODIPINE BESYLATE 10 MG PO TABS
10.0000 mg | ORAL_TABLET | Freq: Every day | ORAL | Status: DC
  Administered 2016-03-24 – 2016-03-25 (×2): 10 mg via ORAL
  Filled 2016-03-24 (×2): qty 1

## 2016-03-24 MED ORDER — NITROGLYCERIN 0.4 MG SL SUBL: 0.4000 mg | SUBLINGUAL_TABLET | SUBLINGUAL | Status: DC | PRN

## 2016-03-24 MED ORDER — ISOSORBIDE MONONITRATE ER 60 MG PO TB24
90.0000 mg | ORAL_TABLET | Freq: Every day | ORAL | Status: DC
  Administered 2016-03-25: 90 mg via ORAL
  Filled 2016-03-24: qty 1

## 2016-03-24 MED ORDER — PANTOPRAZOLE SODIUM 40 MG PO TBEC
40.0000 mg | DELAYED_RELEASE_TABLET | Freq: Two times a day (BID) | ORAL | Status: DC
  Administered 2016-03-24 – 2016-03-25 (×2): 40 mg via ORAL
  Filled 2016-03-24 (×3): qty 1

## 2016-03-24 MED ORDER — ZOLPIDEM TARTRATE 5 MG PO TABS: 10.0000 mg | ORAL_TABLET | Freq: Every evening | ORAL | Status: DC | PRN

## 2016-03-24 MED ORDER — SODIUM CHLORIDE 0.9% FLUSH
3.0000 mL | Freq: Two times a day (BID) | INTRAVENOUS | Status: DC
  Administered 2016-03-24: 3 mL via INTRAVENOUS

## 2016-03-24 MED ORDER — HEPARIN (PORCINE) IN NACL 100-0.45 UNIT/ML-% IJ SOLN
1400.0000 [IU]/h | INTRAMUSCULAR | Status: DC
  Administered 2016-03-24: 1200 [IU]/h via INTRAVENOUS
  Filled 2016-03-24 (×2): qty 250

## 2016-03-24 MED ORDER — HYDRALAZINE HCL 25 MG PO TABS
25.0000 mg | ORAL_TABLET | Freq: Three times a day (TID) | ORAL | Status: DC
  Administered 2016-03-24 – 2016-03-25 (×3): 25 mg via ORAL
  Filled 2016-03-24 (×3): qty 1

## 2016-03-24 MED ORDER — HEPARIN BOLUS VIA INFUSION
4000.0000 [IU] | Freq: Once | INTRAVENOUS | Status: AC
  Administered 2016-03-24: 4000 [IU] via INTRAVENOUS
  Filled 2016-03-24: qty 4000

## 2016-03-24 MED ORDER — CLOPIDOGREL BISULFATE 75 MG PO TABS
75.0000 mg | ORAL_TABLET | Freq: Every day | ORAL | Status: DC
  Administered 2016-03-25: 75 mg via ORAL
  Filled 2016-03-24 (×2): qty 1

## 2016-03-24 MED ORDER — ASPIRIN EC 81 MG PO TBEC
81.0000 mg | DELAYED_RELEASE_TABLET | Freq: Every day | ORAL | Status: DC
  Administered 2016-03-25: 81 mg via ORAL
  Filled 2016-03-24 (×2): qty 1

## 2016-03-24 MED ORDER — MORPHINE SULFATE (PF) 4 MG/ML IV SOLN
4.0000 mg | Freq: Once | INTRAVENOUS | Status: AC
Start: 2016-03-24 — End: 2016-03-24
  Administered 2016-03-24: 4 mg via INTRAVENOUS
  Filled 2016-03-24: qty 1

## 2016-03-24 MED ORDER — RANOLAZINE ER 500 MG PO TB12
500.0000 mg | ORAL_TABLET | Freq: Two times a day (BID) | ORAL | Status: DC
  Administered 2016-03-24 – 2016-03-25 (×2): 500 mg via ORAL
  Filled 2016-03-24 (×2): qty 1

## 2016-03-24 MED ORDER — ONDANSETRON HCL 4 MG/2ML IJ SOLN: 4.0000 mg | Freq: Four times a day (QID) | INTRAMUSCULAR | Status: DC | PRN

## 2016-03-24 MED ORDER — METFORMIN HCL 500 MG PO TABS
500.0000 mg | ORAL_TABLET | Freq: Two times a day (BID) | ORAL | Status: DC
  Administered 2016-03-24 – 2016-03-25 (×2): 500 mg via ORAL
  Filled 2016-03-24 (×2): qty 1

## 2016-03-24 MED ORDER — INSULIN ASPART 100 UNIT/ML ~~LOC~~ SOLN
0.0000 [IU] | Freq: Three times a day (TID) | SUBCUTANEOUS | Status: DC
Start: 2016-03-24 — End: 2016-03-25
  Administered 2016-03-24: 1 [IU] via SUBCUTANEOUS
  Administered 2016-03-25 (×2): 2 [IU] via SUBCUTANEOUS

## 2016-03-24 NOTE — Progress Notes (Signed)
ANTICOAGULATION CONSULT NOTE - Initial Consult  Pharmacy Consult for heparin  Indication: chest pain/ACS  Allergies  Allergen Reactions  . Ciprocin-Fluocin-Procin [Fluocinolone Acetonide] Rash  . Clarithromycin Itching and Other (See Comments)    "Biaxin" Eyes itch and burn  . Cleocin [Clindamycin Hcl] Anaphylaxis and Swelling  . Glimepiride [Amaryl] Other (See Comments)    Elevates liver function     Patient Measurements: Wt 113kg Ht: 5' 5'' IBW= 61.5kg Heparin dosing wt:  88kg  Vital Signs: Temp: 98 F (36.7 C) (07/15 1228) Temp Source: Oral (07/15 1228) BP: 159/95 mmHg (07/15 1700) Pulse Rate: 84 (07/15 1700)  Labs:  Recent Labs  03/24/16 1237  HGB 11.0*  HCT 33.4*  PLT 231  CREATININE 2.36*    CrCl cannot be calculated (Unknown ideal weight.).   Medical History: Past Medical History  Diagnosis Date  . Dyslipidemia   . Coronary artery disease     a. INF MI 1999: PCI of RCA;  b.  extensive stenting of LAD;  c. s/p CABG with RIMA->RCA in 2006;  d. Stable, Low risk MV 05/2012; e. 09/2012 Cath: stable anatomy->med Rx. f. Abnl nuc 11/2013 -> med rx; g. 11/2013 Cath/PCI: LM 30d, LAD 50ost, 20/50isr, 100d, LCX 40-50ost, OM2 50ost, RI 70-80ost sup branch, RCA 20p, 90/60d(2.25x17 & 2.25x24 Promus DES'), PDA 60p, RIMA->RCA ok.  . Hypertensive heart disease   . Hx of echocardiogram     a. Echo 3/12: mild LVH, EF 55-60%, trivial AI  . Anginal pain ([place])   . Myocardial infarction [facility]) 1999; 2002; 2003; 2006; ~ 2008  . OSA on CPAP   . DM2 (diabetes mellitus, type 2) ([place])   . GERD (gastroesophageal reflux disease)   . History of gout   . CKD (chronic kidney disease), stage III   . Polycystic kidney disease     Medications:  Prescriptions prior to admission  Medication Sig Dispense Refill Last Dose  . acetaminophen (TYLENOL) 500 MG tablet Take 1,000 mg by mouth every 8 (eight) hours as needed.   03/24/2016 at Unknown time  . aspirin 81 MG tablet Take 1 tablet (81 mg  total) by mouth daily.   03/24/2016 at Unknown time  . carvedilol (COREG) 25 MG tablet TAKE TWO TABLETS BY MOUTH TWICE DAILY 120 tablet 6 03/24/2016 at 0700  . clopidogrel (PLAVIX) 75 MG tablet Take 1 tablet (75 mg total) by mouth daily. With breakfast. 90 tablet 1 03/24/2016 at Unknown time  . diphenhydrAMINE (BENADRYL) 25 mg capsule Take 25 mg by mouth daily as needed for allergies.   03/24/2016 at Unknown time  . fenofibrate (TRICOR) 145 MG tablet Take 1 tablet (145 mg total) by mouth daily. 30 tablet 11 03/24/2016 at Unknown time  . gabapentin (NEURONTIN) 300 MG capsule Take 1 capsule (300 mg total) by mouth 3 (three) times daily. PATIENT NEEDS OFFICE VISIT FOR ADDITIONAL REFILLS 270 capsule 0 03/24/2016 at Unknown time  . hydrALAZINE (APRESOLINE) 25 MG tablet Take 1 tablet (25 mg total) by mouth 3 (three) times daily. 270 tablet 1 03/24/2016 at Unknown time  . ibuprofen (ADVIL,MOTRIN) 200 MG tablet Take 600 mg by mouth every 6 (six) hours as needed for moderate pain.    03/24/2016 at Unknown time  . isosorbide mononitrate (IMDUR) 30 MG 24 hr tablet Take 90 mg by mouth daily.   03/24/2016 at Unknown time  . meloxicam (MOBIC) 15 MG tablet Take 15 mg by mouth 2 (two) times daily.   03/24/2016 at Unknown time  . metFORMIN (  GLUCOPHAGE) 500 MG tablet Take 250-500 mg by mouth 3 (three) times daily with meals. 500 mg at breakfast and dinner (or at bedtime), 250 mg at lunch   03/24/2016 at Unknown time  . Multiple Vitamin (MULTIVITAMIN WITH MINERALS) TABS tablet Take 1 tablet by mouth daily.   Past Week at Unknown time  . nitroGLYCERIN (NITROLINGUAL) 0.4 MG/SPRAY spray Place 1 spray under the tongue every 5 (five) minutes x 3 doses as needed for chest pain. 12 g 12 03/24/2016 at Unknown time  . Omega-3 Fatty Acids (FISH OIL) 1200 MG CAPS Take 1 capsule (1,200 mg total) by mouth 2 (two) times daily.   03/24/2016 at Unknown time  . pantoprazole (PROTONIX) 40 MG tablet Take 40 mg by mouth 2 (two) times daily.   03/24/2016  at Unknown time  . pravastatin (PRAVACHOL) 80 MG tablet Take 1 tablet (80 mg total) by mouth every evening. 30 tablet 11 03/24/2016 at Unknown time  . ranolazine (RANEXA) 500 MG 12 hr tablet Take 1 tablet (500 mg total) by mouth 2 (two) times daily. 60 tablet 11 03/24/2016 at Unknown time  . vitamin E 400 UNIT capsule Take 400 Units by mouth daily.   Past Month at Unknown time  . zolpidem (AMBIEN) 10 MG tablet TAKE 1 TABLET AT BEDTIME (Patient taking differently: TAKE 1 TABLET AT BEDTIME AS NEEDED) 90 tablet 0 Past Week at Unknown time  . amLODipine (NORVASC) 10 MG tablet Take 1 tablet (10 mg total) by mouth daily. 90 tablet 3 Taking  . furosemide (LASIX) 20 MG tablet Take 1 tablet (20 mg total) by mouth daily. (Patient not taking: Reported on 03/24/2016) 7 tablet 0 Not Taking at Unknown time  . losartan (COZAAR) 100 MG tablet ON HOLD 01/24/16     . naproxen sodium (ANAPROX) 220 MG tablet Take 440 mg by mouth 2 (two) times daily as needed ([place]).    unkn   Scheduled:  . amLODipine  10 mg Oral Daily  . [START ON 03/25/2016] aspirin EC  81 mg Oral Daily  . carvedilol  50 mg Oral BID  . [START ON 03/25/2016] clopidogrel  75 mg Oral Daily  . colchicine  0.6 mg Oral BID  . gabapentin  300 mg Oral TID  . hydrALAZINE  25 mg Oral TID  . [START ON 03/25/2016] insulin aspart  0-9 Units Subcutaneous TID WC  . [START ON 03/25/2016] isosorbide mononitrate  90 mg Oral Daily  . [START ON 03/25/2016] losartan  100 mg Oral Daily  . [START ON 03/25/2016] metFORMIN  250 mg Oral Q lunch  . metFORMIN  500 mg Oral BID WC  . pantoprazole  40 mg Oral BID  . [START ON 03/25/2016] pravastatin  80 mg Oral QPM  . ranolazine  500 mg Oral BID  . sodium chloride flush  3 mL Intravenous Q12H    Assessment: 55 yo male here with CP (history of CAD with CABG and multiple PCIs) to begin heparin per pharmacy  Goal of Therapy:  Heparin level 0.3-0.7 units/ml Monitor platelets by anticoagulation protocol: Yes   Plan:  -Heparin  bolus 4000 units IV followed by 1200 units/hr (~14 units/kg/hr) -Heparin level in 8 hours and daily wth CBC daily  Hildred Laser, Pharm D 03/24/2016 5:42 PM

## 2016-03-24 NOTE — ED Notes (Signed)
Pt. Given crackers and peanut butter. 

## 2016-03-24 NOTE — ED Notes (Signed)
Pt here c/o chest pain that started around 0300. Pt used nitro spray at home which improved from throbbing to pressure. Pt has hx of 7 MI's. Pt also reports gout pain in the right foot.

## 2016-03-24 NOTE — ED Provider Notes (Signed)
CSN: [id number]     Arrival date & time 03/24/16  1216 History   First MD Initiated Contact with Patient 03/24/16 1253     Chief Complaint  Patient presents with  . Chest Pain  . Gout     (Consider location/radiation/quality/duration/timing/severity/associated sxs/prior Treatment) Patient is a 55 y.o. male presenting with chest pain. The history is provided by the patient and medical records.  Chest Pain   55 year old male with history of hypertension, diabetes, chronic kidney disease secondary to polycystic kidney disease, sleep apnea on CPAP, history of multiple MIs, coronary artery disease status post CABG and stenting, hyperlipidemia, presenting to the ED for chest pain. Patient states this began this morning around 3 AM. He states it woke him from sleep. He states pain was initially throbbing in nature but after his home nitroglycerin it improved to a mild pressure. He denies any shortness of breath, dizziness, numbness, weakness, or neck pain.  Patient does have history of chronic angina, states this occurs 1-2 times a week usually. He states this seems to be managed well with his home nitroglycerin. He states his pain today is not as severe as his prior MIs. He is followed by cardiology, Dr. Aundra Dubin.  LHC in 2015-- had stents placed to distal RCA.  Patient also complains of gout flare in his right great toe. He states this is been ongoing for the past week. He saw his primary care doctor who put him on meloxicam. He reports the swelling in his foot has improved but the pain has not. No fever or chills. No redness of the foot.  No numbness or weakness.  Has remained ambulatory but it is painful.  Past Medical History  Diagnosis Date  . Dyslipidemia   . Coronary artery disease     a. INF MI 1999: PCI of RCA;  b.  extensive stenting of LAD;  c. s/p CABG with RIMA->RCA in 2006;  d. Stable, Low risk MV 05/2012; e. 09/2012 Cath: stable anatomy->med Rx. f. Abnl nuc 11/2013 -> med rx; g. 11/2013  Cath/PCI: LM 30d, LAD 50ost, 20/50isr, 100d, LCX 40-50ost, OM2 50ost, RI 70-80ost sup branch, RCA 20p, 90/60d(2.25x17 & 2.25x24 Promus DES'), PDA 60p, RIMA->RCA ok.  . Hypertensive heart disease   . Hx of echocardiogram     a. Echo 3/12: mild LVH, EF 55-60%, trivial AI  . Anginal pain ([place])   . Myocardial infarction [facility]) 1999; 2002; 2003; 2006; ~ 2008  . OSA on CPAP   . DM2 (diabetes mellitus, type 2) ([place])   . GERD (gastroesophageal reflux disease)   . History of gout   . CKD (chronic kidney disease), stage III   . Polycystic kidney disease    Past Surgical History  Procedure Laterality Date  . Cystectomy  1990's    off face  . Left heart catheterization with coronary/graft angiogram N/A 09/16/2012    Procedure: LEFT HEART CATHETERIZATION WITH [name];  Surgeon: [name], MD;  Location: [facility] CATH LAB;  Service: Cardiovascular;  Laterality: N/A;  . Left heart catheterization with coronary/graft angiogram N/A 12/02/2013    Procedure: LEFT HEART CATHETERIZATION WITH [name];  Surgeon: [name], MD;  Location: [place] CATH LAB;  Service: Cardiovascular;  Laterality: N/A;  . Percutaneous coronary stent intervention (pci-s) N/A 12/03/2013    Procedure: PERCUTANEOUS CORONARY STENT INTERVENTION (PCI-S);  Surgeon: [name], MD;  Location: [facility] CATH LAB;  Service: Cardiovascular;  Laterality: N/A;  . Coronary artery bypass graft  2006  CABG X?; in [place]  . Coronary angioplasty with stent placement  1999 / 2002 / 2004 / 2006?  Marland Kitchen Cardiac catheterization  2012  . Cardiac catheterization  2013  . Cardiac catheterization  2014    LHC (1/14): total occlusion of distal LAD, diffuse disease in ramus, 70% proximal LCx, 50% proximal and mid RCA, 50% distal RCA, patent RIMA-RCA.   Family History  Problem Relation Age of Onset  . Lung cancer Mother   . Anemia Mother   . Polycystic kidney disease Mother   . Diabetes Father   . Heart attack Father 74     Died suddenly  . Heart failure Father   . Hyperlipidemia Father   . Hypertension Father   . Kidney failure Sister 5    Died  . Heart attack Sister   . Hypertension Sister   . Diabetes Sister   . Diabetes Brother   . Polycystic kidney disease Brother   . Diabetes Sister    Social History  Substance Use Topics  . Smoking status: Never Smoker   . Smokeless tobacco: Never Used     Comment: smoked some as a teenager (high school)  . Alcohol Use: No     Comment: 09/15/2012 "drank a little when I was young"    Review of Systems  Cardiovascular: Positive for chest pain.  Musculoskeletal: Positive for arthralgias.  All other systems reviewed and are negative.     Allergies  Ciprocin-fluocin-procin; Clarithromycin; Cleocin; and Glimepiride  Home Medications   Prior to Admission medications   Medication Sig Start Date End Date Taking? Authorizing Provider  acetaminophen (TYLENOL) 325 MG tablet Take 2 tablets (650 mg total) by mouth every 4 (four) hours as needed for headache or mild pain. 01/02/15   Erlene Quan, PA-C  amLODipine (NORVASC) 10 MG tablet Take 1 tablet (10 mg total) by mouth daily. 01/12/15   Darreld Mclean, MD  aspirin 81 MG tablet Take 1 tablet (81 mg total) by mouth daily. 12/04/13   Rogelia Mire, NP  carvedilol (COREG) 25 MG tablet TAKE TWO TABLETS BY MOUTH TWICE DAILY 01/25/16   Larey Dresser, MD  clopidogrel (PLAVIX) 75 MG tablet Take 1 tablet (75 mg total) by mouth daily. With breakfast. 01/23/16   Larey Dresser, MD  fenofibrate (TRICOR) 145 MG tablet Take 1 tablet (145 mg total) by mouth daily. 10/26/15   Larey Dresser, MD  furosemide (LASIX) 20 MG tablet Take 1 tablet (20 mg total) by mouth daily. 07/17/15   Noemi Chapel, MD  gabapentin (NEURONTIN) 300 MG capsule Take 1 capsule (300 mg total) by mouth 3 (three) times daily. PATIENT NEEDS OFFICE VISIT FOR ADDITIONAL REFILLS 03/31/15   Darreld Mclean, MD  hydrALAZINE (APRESOLINE) 25 MG tablet Take 1  tablet (25 mg total) by mouth 3 (three) times daily. 01/24/16   Larey Dresser, MD  ibuprofen (ADVIL,MOTRIN) 200 MG tablet Take 400 mg by mouth every 6 (six) hours as needed for moderate pain.    Historical Provider, MD  isosorbide mononitrate (IMDUR) 60 MG 24 hr tablet Take 1 tablet (60 mg total) by mouth 2 (two) times daily. 02/03/15   Larey Dresser, MD  losartan (COZAAR) 100 MG tablet ON HOLD 01/24/16 01/24/16   Larey Dresser, MD  Multiple Vitamin (MULTIVITAMIN WITH MINERALS) TABS tablet Take 1 tablet by mouth daily.    Historical Provider, MD  naproxen sodium (ANAPROX) 220 MG tablet Take 220 mg by mouth 2 (two) times  daily with a meal.    Historical Provider, MD  nitroGLYCERIN (NITROLINGUAL) 0.4 MG/SPRAY spray Place 1 spray under the tongue every 5 (five) minutes x 3 doses as needed for chest pain. 01/23/16   Larey Dresser, MD  Omega-3 Fatty Acids (FISH OIL) 1200 MG CAPS Take 1 capsule (1,200 mg total) by mouth 2 (two) times daily. 02/03/15   Larey Dresser, MD  pantoprazole (PROTONIX) 40 MG tablet Take 40 mg by mouth 2 (two) times daily.    Historical Provider, MD  pravastatin (PRAVACHOL) 80 MG tablet Take 1 tablet (80 mg total) by mouth every evening. 10/26/15   Larey Dresser, MD  ranolazine (RANEXA) 500 MG 12 hr tablet Take 1 tablet (500 mg total) by mouth 2 (two) times daily. 12/14/15   Larey Dresser, MD  vitamin E 400 UNIT capsule Take 400 Units by mouth daily.    Historical Provider, MD  zolpidem (AMBIEN) 10 MG tablet TAKE 1 TABLET AT BEDTIME 01/05/15   Gay Filler Copland, MD   BP 170/88 mmHg  Pulse 78  Temp(Src) 98 F (36.7 C) (Oral)  Resp 16  SpO2 100%   Physical Exam  Constitutional: He is oriented to person, place, and time. He appears well-developed and well-nourished.  HENT:  Head: Normocephalic and atraumatic.  Mouth/Throat: Oropharynx is clear and moist.  Eyes: Conjunctivae and EOM are normal. Pupils are equal, round, and reactive to light.  Neck: Normal range of motion.   Cardiovascular: Normal rate, regular rhythm and normal heart sounds.   Pulmonary/Chest: Effort normal and breath sounds normal. No respiratory distress. He has no wheezes.  Abdominal: Soft. Bowel sounds are normal.  Musculoskeletal: Normal range of motion.  Right great toe is exquisitely tender to palpation at the MCP joint, there is mild swelling without erythema, induration, or signs of cellulitis; DP pulse intact, normal sensation throughout foot  Neurological: He is alert and oriented to person, place, and time.  Skin: Skin is warm and dry.  Psychiatric: He has a normal mood and affect.  Nursing note and vitals reviewed.   ED Course  Procedures (including critical care time) Labs Review Labs Reviewed  BASIC METABOLIC PANEL - Abnormal; Notable for the following:    Chloride 112 (*)    CO2 19 (*)    Glucose, Bld 118 (*)    BUN 26 (*)    Creatinine, Ser 2.36 (*)    GFR calc non Af Amer 29 (*)    GFR calc Af Amer 34 (*)    All other components within normal limits  CBC - Abnormal; Notable for the following:    RBC 3.85 (*)    Hemoglobin 11.0 (*)    HCT 33.4 (*)    All other components within normal limits  I-STAT TROPOININ, ED    Imaging Review Dg Chest 2 View  03/24/2016  CLINICAL DATA:  Chest pain EXAM: CHEST  2 VIEW COMPARISON:  07/17/2015. FINDINGS: The heart size and mediastinal contours are within normal limits. Both lungs are clear. The visualized skeletal structures are unremarkable. IMPRESSION: No active cardiopulmonary disease. Electronically Signed   By: Misty Stanley M.D.   On: 03/24/2016 14:18   I have personally reviewed and evaluated these images and lab results as part of my medical decision-making.   EKG Interpretation   Date/Time:  Saturday March 24 2016 12:18:28 EDT Ventricular Rate:  80 PR Interval:  146 QRS Duration: 104 QT Interval:  366 QTC Calculation: 422 R Axis:   -55  Text Interpretation:  Normal sinus rhythm Left axis deviation   Anterolateral infarct , age undetermined Abnormal ECG Inferior Q waves  noted - new No acute changes Confirmed by Kathrynn Humble, MD, Thelma Comp [phone]) on  03/24/2016 12:57:48 PM      MDM   Final diagnoses:  Chest pain, unspecified chest pain type  EKG abnormalities   55 year old male here with chest pain. He has extensive cardiac history with multiple MIs, stents, and prior CABG. He is afebrile and nontoxic. His EKG today does reveal new Q waves. His lab work is reassuring. His chest x-ray is clear. Patient's pain improved with morphine. He also has concurrent gout. Physical exam findings are consistent with such. Do not suspect septic joint.  Given patient's extensive cardiac history and new Q waves, cardiology was consulted. Spoke with Dr. Wynonia Lawman, will see patient in ED and admit for observation.  Patient currently pain free at this time.  VS remain stable.  Larene Pickett, PA-C 03/24/16 1501  Varney Biles, MD 03/25/16 [phone]

## 2016-03-24 NOTE — H&P (Signed)
CARDIOLOGY CONSULT NOTE     Primary Care Physician: Barrie Lyme, FNP Referring Physician:  Dr Kathrynn Humble  Admit Date: 03/24/2016  Reason for consultation:  Chest pain  Matthew Weers. is a 55 y.o. male with a h/o extensive CAD s/p CABG and multiple prior PCIs who now presents with chest pain.  He is well known to Dr Aundra Dubin and has frequent angina.  On review of Dr Oleh Genin notes, it appears that he has done reasonably well with medical therapy though he occasionally presents when out of medicines.  Recently, he has had some difficulty getting his ranexa filled. He has chronic renal failure and thus has been treated with a medical regimen rather than more frequent catheterizations.  The patient reports that he woke from sleep at 3 am with sscp and took a single nitro spray.  He went to sleep but woke an hour later with recurrent pain and again took nitro spray.  At 5:30 am, he took 3 additional nitro sprays for ongoing pain with some improvement in symptoms.  He has had further chest pressure intermittently throughout the day and thus presents for further evaluation.  Currently his pain is much improved.  + mild SOB. His wife reports that he has been "forgetting" to take his evening medicines recently.  He admits that he missed several of them including his  imdur last evening.    He reports gout in his R big toe also.  Pain is moderate in intensity.  He has been given meloxicam recently without improvement.   Today, he denies symptoms of palpitations,  orthopnea, PND, lower extremity edema, dizziness, presyncope, syncope, or neurologic sequela. The patient is tolerating medications without difficulties and is otherwise without complaint today.   Past Medical History  Diagnosis Date  . Dyslipidemia   . Coronary artery disease     a. INF MI 1999: PCI of RCA;  b.  extensive stenting of LAD;  c. s/p CABG with RIMA->RCA in 2006;  d. Stable, Low risk MV 05/2012; e. 09/2012 Cath: stable  anatomy->med Rx. f. Abnl nuc 11/2013 -> med rx; g. 11/2013 Cath/PCI: LM 30d, LAD 50ost, 20/50isr, 100d, LCX 40-50ost, OM2 50ost, RI 70-80ost sup branch, RCA 20p, 90/60d(2.25x17 & 2.25x24 Promus DES'), PDA 60p, RIMA->RCA ok.  . Hypertensive heart disease   . Hx of echocardiogram     a. Echo 3/12: mild LVH, EF 55-60%, trivial AI  . Anginal pain ([place])   . Myocardial infarction [facility]) 1999; 2002; 2003; 2006; ~ 2008  . OSA on CPAP   . DM2 (diabetes mellitus, type 2) ([place])   . GERD (gastroesophageal reflux disease)   . History of gout   . CKD (chronic kidney disease), stage III   . Polycystic kidney disease    Past Surgical History  Procedure Laterality Date  . Cystectomy  1990's    off face  . Left heart catheterization with coronary/graft angiogram N/A 09/16/2012    Procedure: LEFT HEART CATHETERIZATION WITH [name];  Surgeon: [name], MD;  Location: [facility] C[facility] LAB;  Service: Cardiovascular;  Laterality: N/A;  . Left heart catheterization with coronary/graft angiogram N/A 12/02/2013    Procedure: LEFT HEART CATHETERIZATION WITH [name];  Surgeon: [name], MD;  Location: [place] CATH LAB;  Service: Cardiovascular;  Laterality: N/A;  . Percutaneous coronary stent intervention (pci-s) N/A 12/03/2013    Procedure: PERCUTANEOUS CORONARY STENT INTERVENTION (PCI-S);  Surgeon: [name], MD;  Location: [facility] CATH LAB;  Service: Cardiovascular;  Laterality:  N/A;  . Coronary artery bypass graft  2006    CABG X?; in [place]  . Coronary angioplasty with stent placement  1999 / 2002 / 2004 / 2006?  Marland Kitchen Cardiac catheterization  2012  . Cardiac catheterization  2013  . Cardiac catheterization  2014    LHC (1/14): total occlusion of distal LAD, diffuse disease in ramus, 70% proximal LCx, 50% proximal and mid RCA, 50% distal RCA, patent RIMA-RCA.    Medicines reviewed   Allergies  Allergen Reactions  . Ciprocin-Fluocin-Procin [Fluocinolone Acetonide] Rash  .  Clarithromycin Itching and Other (See Comments)    "Biaxin" Eyes itch and burn  . Cleocin [Clindamycin Hcl] Anaphylaxis and Swelling  . Glimepiride [Amaryl] Other (See Comments)    Elevates liver function     Social History   Social History  . Marital Status: Married    Spouse Name: [name]  . Number of Children: 5  . Years of Education: N/A   Occupational History  . retired     Chartered loss adjuster   Social History Main Topics  . Smoking status: Never Smoker   . Smokeless tobacco: Never Used     Comment: smoked some as a teenager (high school)  . Alcohol Use: No     Comment: 09/15/2012 "drank a little when I was young"  . Drug Use: No  . Sexual Activity: Yes   Other Topics Concern  . Not on file   Social History Narrative   Married and lives with wife in [place]. On disability.    Consumes about 2 Coke Zeros a day     Family History  Problem Relation Age of Onset  . Lung cancer Mother   . Anemia Mother   . Polycystic kidney disease Mother   . Diabetes Father   . Heart attack Father 38    Died suddenly  . Heart failure Father   . Hyperlipidemia Father   . Hypertension Father   . Kidney failure Sister 5    Died  . Heart attack Sister   . Hypertension Sister   . Diabetes Sister   . Diabetes Brother   . Polycystic kidney disease Brother   . Diabetes Sister     ROS- All systems are reviewed and negative except as per the HPI above  Physical Exam: Telemetry: Filed Vitals:   03/24/16 1442 03/24/16 1443 03/24/16 1444 03/24/16 1445  BP: 151/81     Pulse:  73 74 67  Temp:      TempSrc:      Resp:    16  SpO2:  100% 100% 100%    GEN- The patient is well appearing, alert and oriented x 3 today.   Head- normocephalic, atraumatic Eyes-  Sclera clear, conjunctiva pink Ears- hearing intact Oropharynx- clear Neck- supple,  Lungs- Clear to ausculation bilaterally, normal work of breathing Heart- Regular rate and rhythm, no murmurs, rubs or gallops, PMI not laterally  displaced GI- soft, NT, ND, + BS Extremities- no clubbing, cyanosis, or edema MS- no significant deformity or atrophy Skin- no rash or lesion Psych- euthymic mood, full affect Neuro- strength and sensation are intact  EKG: reveals sinus rhythm with poor R wave progression and LAHB, unchanged from prior ekgs  Labs:   Lab Results  Component Value Date   WBC 6.2 03/24/2016   HGB 11.0* 03/24/2016   HCT 33.4* 03/24/2016   MCV 86.8 03/24/2016   PLT 231 03/24/2016    Recent Labs Lab 03/24/16 1237  NA 138  K 5.1  CL 112*  CO2 19*  BUN 26*  CREATININE 2.36*  CALCIUM 9.4  GLUCOSE 118*   Lab Results  Component Value Date   CKTOTAL 119 11/11/2011   CKMB 2.2 11/11/2011   TROPONINI <0.03 07/17/2015    Lab Results  Component Value Date   CHOL 159 12/22/2015   CHOL 187 10/20/2015   CHOL 180 08/15/2015   Lab Results  Component Value Date   HDL 32* 12/22/2015   HDL 23* 10/20/2015   HDL 27* 08/15/2015   Lab Results  Component Value Date   LDLCALC 72 12/22/2015   LDLCALC NOT CALC 10/20/2015   LDLCALC 93 08/15/2015   Lab Results  Component Value Date   TRIG 277* 12/22/2015   TRIG 604* 10/20/2015   TRIG 298* 08/15/2015   Lab Results  Component Value Date   CHOLHDL 5.0 12/22/2015   CHOLHDL 8.1* 10/20/2015   CHOLHDL 6.7* 08/15/2015   Lab Results  Component Value Date   LDLDIRECT 72.0 11/03/2014   LDLDIRECT 79.8 08/12/2014   LDLDIRECT 83.2 07/08/2014     Echo 4/16 reviewed,  EF is preserved  ASSESSMENT AND PLAN:   1. Angina/ CAD The patient has a long history of angina for which he follows with Dr Aundra Dubin.  He typically does well as long as he takes his medicines.  I suspect that his worsening symptoms are due to medical nonadherance rather than an acute thrombotic event.   The importance of compliance was discussed today.  I will observe patient overnight.  Will discuss with Dr Aundra Dubin in am. Given renal failure, we should reserve cath for medicine refractory  symptoms or unless he has a robust elevation in CMs. Will place on heparin drip and observe on telemetry Resume home medicines  2. Gout Colchicine He should not be taking NSAIDS with his renal failure.  Would cancel meloxicam  3. Chronic renal failure Stable No change required today  4. HTN Stable No change required today  5. DM Resume home medicines  Thompson Grayer, MD 03/24/2016  3:51 PM

## 2016-03-25 DIAGNOSIS — I208 Other forms of angina pectoris: Secondary | ICD-10-CM

## 2016-03-25 DIAGNOSIS — M10371 Gout due to renal impairment, right ankle and foot: Secondary | ICD-10-CM | POA: Diagnosis not present

## 2016-03-25 DIAGNOSIS — R9431 Abnormal electrocardiogram [ECG] [EKG]: Secondary | ICD-10-CM | POA: Diagnosis not present

## 2016-03-25 DIAGNOSIS — I252 Old myocardial infarction: Secondary | ICD-10-CM | POA: Diagnosis not present

## 2016-03-25 DIAGNOSIS — I2510 Atherosclerotic heart disease of native coronary artery without angina pectoris: Secondary | ICD-10-CM | POA: Diagnosis not present

## 2016-03-25 DIAGNOSIS — I209 Angina pectoris, unspecified: Secondary | ICD-10-CM | POA: Diagnosis not present

## 2016-03-25 DIAGNOSIS — R079 Chest pain, unspecified: Secondary | ICD-10-CM | POA: Diagnosis not present

## 2016-03-25 LAB — LIPID PANEL
CHOL/HDL RATIO: 6.6 ratio
CHOLESTEROL: 177 mg/dL (ref 0–200)
HDL: 27 mg/dL — ABNORMAL LOW (ref >40)
LDL CALC: 74 mg/dL (ref 0–99)
TRIGLYCERIDES: 382 mg/dL — AB (ref <150)
VLDL: 76 mg/dL — AB (ref 0–40)

## 2016-03-25 LAB — BASIC METABOLIC PANEL
Anion gap: 10 (ref 5–15)
BUN: 28 mg/dL — AB (ref 6–20)
CALCIUM: 9.3 mg/dL (ref 8.9–10.3)
CO2: 17 mmol/L — AB (ref 22–32)
CREATININE: 2.1 mg/dL — AB (ref 0.61–1.24)
Chloride: 112 mmol/L — ABNORMAL HIGH (ref 101–111)
GFR calc non Af Amer: 34 mL/min — ABNORMAL LOW (ref >60)
GFR, EST AFRICAN AMERICAN: 39 mL/min — AB (ref >60)
GLUCOSE: 126 mg/dL — AB (ref 65–99)
Potassium: 5.2 mmol/L — ABNORMAL HIGH (ref 3.5–5.1)
Sodium: 139 mmol/L (ref 135–145)

## 2016-03-25 LAB — CBC
HCT: 33.7 % — ABNORMAL LOW (ref 39.0–52.0)
Hemoglobin: 11.2 g/dL — ABNORMAL LOW (ref 13.0–17.0)
MCH: 29 pg (ref 26.0–34.0)
MCHC: 33.2 g/dL (ref 30.0–36.0)
MCV: 87.3 fL (ref 78.0–100.0)
PLATELETS: 241 10*3/uL (ref 150–400)
RBC: 3.86 MIL/uL — ABNORMAL LOW (ref 4.22–5.81)
RDW: 13 % (ref 11.5–15.5)
WBC: 7.1 10*3/uL (ref 4.0–10.5)

## 2016-03-25 LAB — GLUCOSE, CAPILLARY
Glucose-Capillary: 164 mg/dL — ABNORMAL HIGH (ref 65–99)
Glucose-Capillary: 200 mg/dL — ABNORMAL HIGH (ref 65–99)

## 2016-03-25 LAB — TROPONIN I: Troponin I: <0.03 ng/mL (ref <0.03)

## 2016-03-25 LAB — HEPARIN LEVEL (UNFRACTIONATED): Heparin Unfractionated: 0.29 IU/mL — ABNORMAL LOW (ref 0.30–0.70)

## 2016-03-25 MED ORDER — HEPARIN BOLUS VIA INFUSION
1300.0000 [IU] | Freq: Once | INTRAVENOUS | Status: AC
  Administered 2016-03-25: 1300 [IU] via INTRAVENOUS
  Filled 2016-03-25: qty 1300

## 2016-03-25 MED ORDER — COLCHICINE 0.6 MG PO TABS: 0.6000 mg | ORAL_TABLET | Freq: Two times a day (BID) | ORAL | Status: DC

## 2016-03-25 NOTE — Progress Notes (Signed)
SUBJECTIVE: The patient is doing well today.  Chest pain is resolved with resumption of home medicines.  At this time, he denies chest pain, shortness of breath, or any new concerns.  Marland Kitchen amLODipine  10 mg Oral Daily  . aspirin EC  81 mg Oral Daily  . carvedilol  50 mg Oral BID  . clopidogrel  75 mg Oral Daily  . colchicine  0.6 mg Oral BID  . gabapentin  300 mg Oral TID  . hydrALAZINE  25 mg Oral TID  . insulin aspart  0-9 Units Subcutaneous TID WC  . isosorbide mononitrate  90 mg Oral Daily  . losartan  100 mg Oral Daily  . metFORMIN  250 mg Oral Q lunch  . metFORMIN  500 mg Oral BID WC  . pantoprazole  40 mg Oral BID  . pravastatin  80 mg Oral QPM  . ranolazine  500 mg Oral BID  . sodium chloride flush  3 mL Intravenous Q12H   . heparin 1,400 Units/hr (03/25/16 0848)    OBJECTIVE: Physical Exam: Filed Vitals:   03/24/16 1851 03/24/16 1925 03/25/16 0000 03/25/16 0418  BP: 154/80 184/91 165/87 170/92  Pulse:  81  80  Temp:  98 F (36.7 C)  98.2 F (36.8 C)  TempSrc:  Oral  Oral  Resp:  20  25  Height:      Weight:    239 lb (108.41 kg)  SpO2: 100% 100%  98%    Intake/Output Summary (Last 24 hours) at 03/25/16 1157 Last data filed at 03/25/16 0910  Gross per 24 hour  Intake    534 ml  Output   1150 ml  Net   -616 ml    Telemetry reveals sinus rhythm  GEN- The patient is well appearing, alert and oriented x 3 today.   Head- normocephalic, atraumatic Eyes-  Sclera clear, conjunctiva pink Ears- hearing intact Oropharynx- clear Neck- supple,  Lungs- Clear to ausculation bilaterally, normal work of breathing Heart- Regular rate and rhythm  GI- soft, NT, ND, + BS Extremities- no clubbing, cyanosis, or edema Skin- no rash or lesion Psych- euthymic mood, full affect Neuro- strength and sensation are intact  LABS: Basic Metabolic Panel:  Recent Labs  03/24/16 1237 03/25/16 0519  NA 138 139  K 5.1 5.2*  CL 112* 112*  CO2 19* 17*  GLUCOSE 118* 126*    BUN 26* 28*  CREATININE 2.36* 2.10*  CALCIUM 9.4 9.3   Liver Function Tests: No results for input(s): AST, ALT, ALKPHOS, BILITOT, PROT, ALBUMIN in the last 72 hours. No results for input(s): LIPASE, AMYLASE in the last 72 hours. CBC:  Recent Labs  03/24/16 1237 03/25/16 0519  WBC 6.2 7.1  HGB 11.0* 11.2*  HCT 33.4* 33.7*  MCV 86.8 87.3  PLT 231 241   Cardiac Enzymes:  Recent Labs  03/24/16 1821 03/24/16 2322 03/25/16 0519  TROPONINI <0.03 <0.03 <0.03   BNP: Invalid input(s): POCBNP D-Dimer: No results for input(s): DDIMER in the last 72 hours. Hemoglobin A1C: No results for input(s): HGBA1C in the last 72 hours. Fasting Lipid Panel:  Recent Labs  03/25/16 0519  CHOL 177  HDL 27*  LDLCALC 74  TRIG 382*  CHOLHDL 6.6    ASSESSMENT AND PLAN:     Thompson Grayer, MD 03/25/2016 11:57 AM  1. Stable Angina/ CAD The patient has a long history of angina for which he follows with Dr Aundra Dubin. He typically does well as long as he takes his  medicines.  He has been noncompliant with medicines more recently (especially evening doses) and had return of pain yesterday.  No ischemic EKG changes and CMs negative. I have spoken with Dr Aundra Dubin this am and we agree that given renal failure, a conservative approach is best. Resume home medicines with better compliance No further inpatient workup planned  2. Gout Colchicine He should not be taking NSAIDS with his renal failure. Would cancel meloxicam  3. Chronic renal failure Stable No change required today  4. HTN Stable No change required today  5. DM Resume home medicines  DC to home today Follow-up with Dr Aundra Dubin in 4-6 weeks  Thompson Grayer MD, [facility] 03/25/2016 11:59 AM

## 2016-03-25 NOTE — Discharge Summary (Signed)
Discharge Summary    Patient ID: Matthew Slaten.,  MRN: [id number], DOB/AGE: 56/08/1961 56 y.o.  Admit date: 03/24/2016 Discharge date: 03/25/2016  Primary Care Provider: Barrie Lyme Primary Cardiologist: Dr. Aundra Dubin  Discharge Diagnoses    Active Problems:   Chest pain   Angina at rest [facility])   Gout   HTN   DM   CKD, stage III   CAD   Allergies Allergies  Allergen Reactions  . Ciprocin-Fluocin-Procin [Fluocinolone Acetonide] Rash  . Clarithromycin Itching and Other (See Comments)    "Biaxin" Eyes itch and burn  . Cleocin [Clindamycin Hcl] Anaphylaxis and Swelling  . Glimepiride [Amaryl] Other (See Comments)    Elevates liver function     Diagnostic Studies/Procedures    None   History of Present Illness      Matthew Rowe. is a 55 y.o. male with a h/o extensive CAD s/p CABG and multiple prior PCIs who presented 03/24/16 with chest pain. He is well known to Dr Aundra Dubin and has frequent angina. On review of Dr Oleh Genin notes, it appears that he has done reasonably well with medical therapy though he occasionally presents when out of medicines. Recently, he has had some difficulty getting his ranexa filled. He has chronic renal failure and thus has been treated with a medical regimen rather than more frequent catheterizations.  The patient reports that he woke from sleep at 3 am with sscp and took a single nitro spray. He went to sleep but woke an hour later with recurrent pain and again took nitro spray. At 5:30 am, he took 3 additional nitro sprays for ongoing pain with some improvement in symptoms. He has had further chest pressure intermittently throughout the day and thus presents for further evaluation. Currently his pain is much improved. + mild SOB. His wife reports that he has been "forgetting" to take his evening medicines recently. He admits that he missed several of them including his  imdur last evening.   He reports gout in his R big  toe also. Pain is moderate in intensity. He has been given meloxicam recently without improvement.  He denies symptoms of palpitations, orthopnea, PND, lower extremity edema, dizziness, presyncope, syncope, or neurologic sequela. The patient is tolerating medications without difficulties and is otherwise without complaint today  Hospital Course     Consultants: None  The was admitted for observation and ruled out. Chest pain is resolved with resumption of home medicines.felt his symptoms likely from noncompliant with medicines more recently (especially evening doses).  No ischemic EKG changes and CMs negative. Dr. Rayann Heman has spoken with Dr Aundra Dubin and they agree that given renal failure, a conservative approach is best. He should not be taking NSAIDS with his renal failure. Colchicine added and discontinued NSAIDS. Renal function stable. No further chest pain.    The patient has been seen by Dr. Rayann Heman  today and deemed ready for discharge home. All follow-up appointments have been scheduled. Discharge medications are listed below.    Discharge Vitals Blood pressure 165/77, pulse 80, temperature 98.2 F (36.8 C), temperature source Oral, resp. rate 25, height 5\' 5"  (1.651 m), weight 239 lb (108.41 kg), SpO2 98 %.  Filed Weights   03/24/16 1740 03/25/16 0418  Weight: 241 lb 4.8 oz (109.453 kg) 239 lb (108.41 kg)    Labs & Radiologic Studies     CBC  Recent Labs  03/24/16 1237 03/25/16 0519  WBC 6.2 7.1  HGB 11.0* 11.2*  HCT 33.4* 33.7*  MCV 86.8 87.3  PLT 231 [id number]   Basic Metabolic Panel  Recent Labs  03/24/16 1237 03/25/16 0519  NA 138 139  K 5.1 5.2*  CL 112* 112*  CO2 19* 17*  GLUCOSE 118* 126*  BUN 26* 28*  CREATININE 2.36* 2.10*  CALCIUM 9.4 9.3   Liver Function Tests No results for input(s): AST, ALT, ALKPHOS, BILITOT, PROT, ALBUMIN in the last 72 hours. No results for input(s): LIPASE, AMYLASE in the last 72 hours. Cardiac Enzymes  Recent Labs   03/24/16 1821 03/24/16 2322 03/25/16 0519  TROPONINI <0.03 <0.03 <0.03   BNP Invalid input(s): POCBNP D-Dimer No results for input(s): DDIMER in the last 72 hours. Hemoglobin A1C No results for input(s): HGBA1C in the last 72 hours. Fasting Lipid Panel  Recent Labs  03/25/16 0519  CHOL 177  HDL 27*  LDLCALC 74  TRIG 382*  CHOLHDL 6.6   Thyroid Function Tests No results for input(s): TSH, T4TOTAL, T3FREE, THYROIDAB in the last 72 hours.  Invalid input(s): FREET3  Dg Chest 2 View  03/24/2016  CLINICAL DATA:  Chest pain EXAM: CHEST  2 VIEW COMPARISON:  07/17/2015. FINDINGS: The heart size and mediastinal contours are within normal limits. Both lungs are clear. The visualized skeletal structures are unremarkable. IMPRESSION: No active cardiopulmonary disease. Electronically Signed   By: Misty Stanley M.D.   On: 03/24/2016 14:18    Disposition   Pt is being discharged home today in good condition.  Follow-up Plans & Appointments    Follow-up Information    Follow up with [name], MD.   Specialty:  Cardiology   Why:  office will call with appointment   Contact information:   [address] [place] [address] [phone]       Follow up with Barrie Lyme, FNP In 1 week.   Specialty:  Nurse Practitioner   Why:  for post hospital and gout   Contact information:   [address] [place] [address] [phone]      Discharge Instructions    Diet - low sodium heart healthy    Complete by:  As directed      Discharge instructions    Complete by:  As directed   Please do not take  ibuprofen, Advil, Motrin, naproxen, and Aleve --> this can worsen your kidney function. You are placed on colchicine for gout --> f/u with PCP for further refill.     Increase activity slowly    Complete by:  As directed            Discharge Medications   Current Discharge Medication List    START taking these medications   Details   colchicine 0.6 MG tablet Take 1 tablet (0.6 mg total) by mouth 2 (two) times daily. Qty: 60 tablet, Refills: 1      CONTINUE these medications which have NOT CHANGED   Details  acetaminophen (TYLENOL) 500 MG tablet Take 1,000 mg by mouth every 8 (eight) hours as needed.    aspirin 81 MG tablet Take 1 tablet (81 mg total) by mouth daily.    carvedilol (COREG) 25 MG tablet TAKE TWO TABLETS BY MOUTH TWICE DAILY Qty: 120 tablet, Refills: 6    clopidogrel (PLAVIX) 75 MG tablet Take 1 tablet (75 mg total) by mouth daily. With breakfast. Qty: 90 tablet, Refills: 1    diphenhydrAMINE (BENADRYL) 25 mg capsule Take 25 mg by mouth daily as needed  for allergies.    fenofibrate (TRICOR) 145 MG tablet Take 1 tablet (145 mg total) by mouth daily. Qty: 30 tablet, Refills: 11    gabapentin (NEURONTIN) 300 MG capsule Take 1 capsule (300 mg total) by mouth 3 (three) times daily. PATIENT NEEDS OFFICE VISIT FOR ADDITIONAL REFILLS Qty: 270 capsule, Refills: 0    hydrALAZINE (APRESOLINE) 25 MG tablet Take 1 tablet (25 mg total) by mouth 3 (three) times daily. Qty: 270 tablet, Refills: 1    isosorbide mononitrate (IMDUR) 30 MG 24 hr tablet Take 90 mg by mouth daily.    metFORMIN (GLUCOPHAGE) 500 MG tablet Take 250-500 mg by mouth 3 (three) times daily with meals. 500 mg at breakfast and dinner (or at bedtime), 250 mg at lunch    Multiple Vitamin (MULTIVITAMIN WITH MINERALS) TABS tablet Take 1 tablet by mouth daily.    nitroGLYCERIN (NITROLINGUAL) 0.4 MG/SPRAY spray Place 1 spray under the tongue every 5 (five) minutes x 3 doses as needed for chest pain. Qty: 12 g, Refills: 12    Omega-3 Fatty Acids (FISH OIL) 1200 MG CAPS Take 1 capsule (1,200 mg total) by mouth 2 (two) times daily.   Associated Diagnoses: Coronary atherosclerosis of autologous vein bypass graft without angina; Chest pain, unspecified chest pain type    pantoprazole (PROTONIX) 40 MG tablet Take 40 mg by mouth 2 (two) times daily.     pravastatin (PRAVACHOL) 80 MG tablet Take 1 tablet (80 mg total) by mouth every evening. Qty: 30 tablet, Refills: 11    ranolazine (RANEXA) 500 MG 12 hr tablet Take 1 tablet (500 mg total) by mouth 2 (two) times daily. Qty: 60 tablet, Refills: 11    vitamin E 400 UNIT capsule Take 400 Units by mouth daily.    zolpidem (AMBIEN) 10 MG tablet TAKE 1 TABLET AT BEDTIME Qty: 90 tablet, Refills: 0    amLODipine (NORVASC) 10 MG tablet Take 1 tablet (10 mg total) by mouth daily. Qty: 90 tablet, Refills: 3    furosemide (LASIX) 20 MG tablet Take 1 tablet (20 mg total) by mouth daily. Qty: 7 tablet, Refills: 0    losartan (COZAAR) 100 MG tablet ON HOLD 01/24/16      STOP taking these medications     ibuprofen (ADVIL,MOTRIN) 200 MG tablet      meloxicam (MOBIC) 15 MG tablet      naproxen sodium (ANAPROX) 220 MG tablet             Outstanding Labs/Studies     Duration of Discharge Encounter   Greater than 30 minutes including physician time.  Signed, Bhagat,Bhavinkumar PA-C 03/25/2016, 12:48 PM     Thompson Grayer MD, [facility]

## 2016-03-25 NOTE — Progress Notes (Signed)
ANTICOAGULATION CONSULT NOTE - Initial Consult  Pharmacy Consult for heparin  Indication: chest pain/ACS  Allergies  Allergen Reactions  . Ciprocin-Fluocin-Procin [Fluocinolone Acetonide] Rash  . Clarithromycin Itching and Other (See Comments)    "Biaxin" Eyes itch and burn  . Cleocin [Clindamycin Hcl] Anaphylaxis and Swelling  . Glimepiride [Amaryl] Other (See Comments)    Elevates liver function     Patient Measurements: Wt 113kg Ht: 5' 5'' IBW= 61.5kg Heparin dosing wt:  88kg  Vital Signs: Temp: 98.2 F (36.8 C) (07/16 0418) Temp Source: Oral (07/16 0418) BP: 170/92 mmHg (07/16 0418) Pulse Rate: 80 (07/16 0418)  Labs:  Recent Labs  03/24/16 1237 03/24/16 1821 03/24/16 2322 03/25/16 0509 03/25/16 0519  HGB 11.0*  --   --   --  11.2*  HCT 33.4*  --   --   --  33.7*  PLT 231  --   --   --  241  HEPARINUNFRC  --   --   --  0.29*  --   CREATININE 2.36*  --   --   --  2.10*  TROPONINI  --  <0.03 <0.03  --  <0.03    Estimated Creatinine Clearance: 44.6 mL/min (by C-G formula based on Cr of 2.1).   Medical History: Past Medical History  Diagnosis Date  . Dyslipidemia   . Coronary artery disease     a. INF MI 1999: PCI of RCA;  b.  extensive stenting of LAD;  c. s/p CABG with RIMA->RCA in 2006;  d. Stable, Low risk MV 05/2012; e. 09/2012 Cath: stable anatomy->med Rx. f. Abnl nuc 11/2013 -> med rx; g. 11/2013 Cath/PCI: LM 30d, LAD 50ost, 20/50isr, 100d, LCX 40-50ost, OM2 50ost, RI 70-80ost sup branch, RCA 20p, 90/60d(2.25x17 & 2.25x24 Promus DES'), PDA 60p, RIMA->RCA ok.  . Hypertensive heart disease   . Hx of echocardiogram     a. Echo 3/12: mild LVH, EF 55-60%, trivial AI  . Anginal pain ([place])   . Myocardial infarction [facility]) 1999; 2002; 2003; 2006; ~ 2008  . OSA on CPAP   . DM2 (diabetes mellitus, type 2) ([place])   . GERD (gastroesophageal reflux disease)   . History of gout   . CKD (chronic kidney disease), stage III   . Polycystic kidney disease      Medications:  Prescriptions prior to admission  Medication Sig Dispense Refill Last Dose  . acetaminophen (TYLENOL) 500 MG tablet Take 1,000 mg by mouth every 8 (eight) hours as needed.   03/24/2016 at Unknown time  . aspirin 81 MG tablet Take 1 tablet (81 mg total) by mouth daily.   03/24/2016 at Unknown time  . carvedilol (COREG) 25 MG tablet TAKE TWO TABLETS BY MOUTH TWICE DAILY 120 tablet 6 03/24/2016 at 0700  . clopidogrel (PLAVIX) 75 MG tablet Take 1 tablet (75 mg total) by mouth daily. With breakfast. 90 tablet 1 03/24/2016 at Unknown time  . diphenhydrAMINE (BENADRYL) 25 mg capsule Take 25 mg by mouth daily as needed for allergies.   03/24/2016 at Unknown time  . fenofibrate (TRICOR) 145 MG tablet Take 1 tablet (145 mg total) by mouth daily. 30 tablet 11 03/24/2016 at Unknown time  . gabapentin (NEURONTIN) 300 MG capsule Take 1 capsule (300 mg total) by mouth 3 (three) times daily. PATIENT NEEDS OFFICE VISIT FOR ADDITIONAL REFILLS 270 capsule 0 03/24/2016 at Unknown time  . hydrALAZINE (APRESOLINE) 25 MG tablet Take 1 tablet (25 mg total) by mouth 3 (three) times daily. 270 tablet  1 03/24/2016 at Unknown time  . ibuprofen (ADVIL,MOTRIN) 200 MG tablet Take 600 mg by mouth every 6 (six) hours as needed for moderate pain.    03/24/2016 at Unknown time  . isosorbide mononitrate (IMDUR) 30 MG 24 hr tablet Take 90 mg by mouth daily.   03/24/2016 at Unknown time  . meloxicam (MOBIC) 15 MG tablet Take 15 mg by mouth 2 (two) times daily.   03/24/2016 at Unknown time  . metFORMIN (GLUCOPHAGE) 500 MG tablet Take 250-500 mg by mouth 3 (three) times daily with meals. 500 mg at breakfast and dinner (or at bedtime), 250 mg at lunch   03/24/2016 at Unknown time  . Multiple Vitamin (MULTIVITAMIN WITH MINERALS) TABS tablet Take 1 tablet by mouth daily.   Past Week at Unknown time  . nitroGLYCERIN (NITROLINGUAL) 0.4 MG/SPRAY spray Place 1 spray under the tongue every 5 (five) minutes x 3 doses as needed for chest  pain. 12 g 12 03/24/2016 at Unknown time  . Omega-3 Fatty Acids (FISH OIL) 1200 MG CAPS Take 1 capsule (1,200 mg total) by mouth 2 (two) times daily.   03/24/2016 at Unknown time  . pantoprazole (PROTONIX) 40 MG tablet Take 40 mg by mouth 2 (two) times daily.   03/24/2016 at Unknown time  . pravastatin (PRAVACHOL) 80 MG tablet Take 1 tablet (80 mg total) by mouth every evening. 30 tablet 11 03/24/2016 at Unknown time  . ranolazine (RANEXA) 500 MG 12 hr tablet Take 1 tablet (500 mg total) by mouth 2 (two) times daily. 60 tablet 11 03/24/2016 at Unknown time  . vitamin E 400 UNIT capsule Take 400 Units by mouth daily.   Past Month at Unknown time  . zolpidem (AMBIEN) 10 MG tablet TAKE 1 TABLET AT BEDTIME (Patient taking differently: TAKE 1 TABLET AT BEDTIME AS NEEDED) 90 tablet 0 Past Week at Unknown time  . amLODipine (NORVASC) 10 MG tablet Take 1 tablet (10 mg total) by mouth daily. 90 tablet 3 Taking  . furosemide (LASIX) 20 MG tablet Take 1 tablet (20 mg total) by mouth daily. (Patient not taking: Reported on 03/24/2016) 7 tablet 0 Not Taking at Unknown time  . losartan (COZAAR) 100 MG tablet ON HOLD 01/24/16     . naproxen sodium (ANAPROX) 220 MG tablet Take 440 mg by mouth 2 (two) times daily as needed (Aleve).    unkn   Scheduled:  . amLODipine  10 mg Oral Daily  . aspirin EC  81 mg Oral Daily  . carvedilol  50 mg Oral BID  . clopidogrel  75 mg Oral Daily  . colchicine  0.6 mg Oral BID  . gabapentin  300 mg Oral TID  . hydrALAZINE  25 mg Oral TID  . insulin aspart  0-9 Units Subcutaneous TID WC  . isosorbide mononitrate  90 mg Oral Daily  . losartan  100 mg Oral Daily  . metFORMIN  250 mg Oral Q lunch  . metFORMIN  500 mg Oral BID WC  . pantoprazole  40 mg Oral BID  . pravastatin  80 mg Oral QPM  . ranolazine  500 mg Oral BID  . sodium chloride flush  3 mL Intravenous Q12H    Assessment: 55 yo male here with CP (history of CAD with CABG and multiple PCIs) to begin heparin per pharmacy.  First heparin level was subtherapeutic at 0.29 IU/mL. Patient would benefit from additional heparin bolus and increased maintenance rate.   H&H low but stable, platelets wnl. No notes  of bleeding reported.   Goal of Therapy:  Heparin level 0.3-0.7 units/ml Monitor platelets by anticoagulation protocol: Yes   Plan:  - Bolus: Heparin 1300 unit IV one time. - Maintenance rate: Heparin 1400 units/hr IV infusion (~16 units/kg/hr) - Heparin level in 6 hours and daily CBC.  - Monitor for s/sx of bleeding.   Demetrius Charity, PharmD Acute Care Pharmacy Resident  Pager: [phone] 03/25/2016

## 2016-03-28 NOTE — Telephone Encounter (Signed)
No answer, voicemail not set up. 

## 2016-03-30 ENCOUNTER — Emergency Department (HOSPITAL_COMMUNITY): Payer: Medicare Other

## 2016-03-30 ENCOUNTER — Inpatient Hospital Stay (HOSPITAL_COMMUNITY)
Admission: EM | Admit: 2016-03-30 | Discharge: 2016-03-31 | DRG: 303 | Disposition: A | Discharge status: Home / Self Care | Payer: Medicare Other | Attending: Internal Medicine | Admitting: Internal Medicine

## 2016-03-30 ENCOUNTER — Encounter (HOSPITAL_COMMUNITY): Payer: Self-pay | Admitting: Emergency Medicine

## 2016-03-30 DIAGNOSIS — Z8271 Family history of polycystic kidney: Secondary | ICD-10-CM | POA: Diagnosis not present

## 2016-03-30 DIAGNOSIS — Z955 Presence of coronary angioplasty implant and graft: Secondary | ICD-10-CM

## 2016-03-30 DIAGNOSIS — I1310 Hypertensive heart and chronic kidney disease without heart failure, with stage 1 through stage 4 chronic kidney disease, or unspecified chronic kidney disease: Secondary | ICD-10-CM | POA: Diagnosis not present

## 2016-03-30 DIAGNOSIS — R072 Precordial pain: Secondary | ICD-10-CM | POA: Diagnosis present

## 2016-03-30 DIAGNOSIS — R079 Chest pain, unspecified: Secondary | ICD-10-CM | POA: Diagnosis not present

## 2016-03-30 DIAGNOSIS — Z8249 Family history of ischemic heart disease and other diseases of the circulatory system: Secondary | ICD-10-CM

## 2016-03-30 DIAGNOSIS — E1122 Type 2 diabetes mellitus with diabetic chronic kidney disease: Secondary | ICD-10-CM | POA: Diagnosis not present

## 2016-03-30 DIAGNOSIS — Z881 Allergy status to other antibiotic agents status: Secondary | ICD-10-CM | POA: Diagnosis not present

## 2016-03-30 DIAGNOSIS — I25119 Atherosclerotic heart disease of native coronary artery with unspecified angina pectoris: Secondary | ICD-10-CM | POA: Diagnosis not present

## 2016-03-30 DIAGNOSIS — I200 Unstable angina: Secondary | ICD-10-CM

## 2016-03-30 DIAGNOSIS — E785 Hyperlipidemia, unspecified: Secondary | ICD-10-CM | POA: Diagnosis present

## 2016-03-30 DIAGNOSIS — M109 Gout, unspecified: Secondary | ICD-10-CM | POA: Diagnosis not present

## 2016-03-30 DIAGNOSIS — I252 Old myocardial infarction: Secondary | ICD-10-CM | POA: Diagnosis not present

## 2016-03-30 DIAGNOSIS — I2510 Atherosclerotic heart disease of native coronary artery without angina pectoris: Secondary | ICD-10-CM | POA: Diagnosis not present

## 2016-03-30 DIAGNOSIS — K219 Gastro-esophageal reflux disease without esophagitis: Secondary | ICD-10-CM | POA: Diagnosis present

## 2016-03-30 DIAGNOSIS — Z7984 Long term (current) use of oral hypoglycemic drugs: Secondary | ICD-10-CM

## 2016-03-30 DIAGNOSIS — Z7902 Long term (current) use of antithrombotics/antiplatelets: Secondary | ICD-10-CM

## 2016-03-30 DIAGNOSIS — Q613 Polycystic kidney, unspecified: Secondary | ICD-10-CM | POA: Diagnosis not present

## 2016-03-30 DIAGNOSIS — N183 Chronic kidney disease, stage 3 (moderate): Secondary | ICD-10-CM | POA: Diagnosis present

## 2016-03-30 DIAGNOSIS — Z7982 Long term (current) use of aspirin: Secondary | ICD-10-CM

## 2016-03-30 DIAGNOSIS — Z951 Presence of aortocoronary bypass graft: Secondary | ICD-10-CM

## 2016-03-30 DIAGNOSIS — G4733 Obstructive sleep apnea (adult) (pediatric): Secondary | ICD-10-CM | POA: Diagnosis present

## 2016-03-30 DIAGNOSIS — Z833 Family history of diabetes mellitus: Secondary | ICD-10-CM

## 2016-03-30 DIAGNOSIS — Z841 Family history of disorders of kidney and ureter: Secondary | ICD-10-CM | POA: Diagnosis not present

## 2016-03-30 DIAGNOSIS — Z9114 Patient's other noncompliance with medication regimen: Secondary | ICD-10-CM

## 2016-03-30 DIAGNOSIS — Z79899 Other long term (current) drug therapy: Secondary | ICD-10-CM

## 2016-03-30 DIAGNOSIS — Z888 Allergy status to other drugs, medicaments and biological substances status: Secondary | ICD-10-CM

## 2016-03-30 LAB — COMPREHENSIVE METABOLIC PANEL
ALBUMIN: 4 g/dL (ref 3.5–5.0)
ALK PHOS: 56 U/L (ref 38–126)
ALT: 22 U/L (ref 17–63)
ANION GAP: 8 (ref 5–15)
AST: 21 U/L (ref 15–41)
BILIRUBIN TOTAL: 0.5 mg/dL (ref 0.3–1.2)
BUN: 28 mg/dL — AB (ref 6–20)
CALCIUM: 9.5 mg/dL (ref 8.9–10.3)
CO2: 18 mmol/L — AB (ref 22–32)
CREATININE: 2.77 mg/dL — AB (ref 0.61–1.24)
Chloride: 111 mmol/L (ref 101–111)
GFR calc Af Amer: 28 mL/min — ABNORMAL LOW (ref >60)
GFR calc non Af Amer: 24 mL/min — ABNORMAL LOW (ref >60)
GLUCOSE: 211 mg/dL — AB (ref 65–99)
Potassium: 5.1 mmol/L (ref 3.5–5.1)
Sodium: 137 mmol/L (ref 135–145)
TOTAL PROTEIN: 6.7 g/dL (ref 6.5–8.1)

## 2016-03-30 LAB — CBC WITH DIFFERENTIAL/PLATELET
BASOS PCT: 1 %
Basophils Absolute: 0 10*3/uL (ref 0.0–0.1)
Eosinophils Absolute: 0.1 10*3/uL (ref 0.0–0.7)
Eosinophils Relative: 2 %
HEMATOCRIT: 33.4 % — AB (ref 39.0–52.0)
HEMOGLOBIN: 10.9 g/dL — AB (ref 13.0–17.0)
LYMPHS ABS: 1.8 10*3/uL (ref 0.7–4.0)
Lymphocytes Relative: 24 %
MCH: 28.4 pg (ref 26.0–34.0)
MCHC: 32.6 g/dL (ref 30.0–36.0)
MCV: 87 fL (ref 78.0–100.0)
MONOS PCT: 9 %
Monocytes Absolute: 0.7 10*3/uL (ref 0.1–1.0)
NEUTROS ABS: 5 10*3/uL (ref 1.7–7.7)
NEUTROS PCT: 64 %
Platelets: 217 10*3/uL (ref 150–400)
RBC: 3.84 MIL/uL — AB (ref 4.22–5.81)
RDW: 13.1 % (ref 11.5–15.5)
WBC: 7.7 10*3/uL (ref 4.0–10.5)

## 2016-03-30 LAB — HEPARIN LEVEL (UNFRACTIONATED): Heparin Unfractionated: 0.47 IU/mL (ref 0.30–0.70)

## 2016-03-30 LAB — I-STAT TROPONIN, ED: Troponin i, poc: 0 ng/mL (ref 0.00–0.08)

## 2016-03-30 LAB — TROPONIN I

## 2016-03-30 LAB — GLUCOSE, CAPILLARY
GLUCOSE-CAPILLARY: 102 mg/dL — AB (ref 65–99)
Glucose-Capillary: 134 mg/dL — ABNORMAL HIGH (ref 65–99)

## 2016-03-30 MED ORDER — ACETAMINOPHEN 325 MG PO TABS
650.0000 mg | ORAL_TABLET | ORAL | Status: DC | PRN
  Administered 2016-03-30: 650 mg via ORAL
  Filled 2016-03-30: qty 2

## 2016-03-30 MED ORDER — NITROGLYCERIN 0.4 MG SL SUBL: 0.4000 mg | SUBLINGUAL_TABLET | SUBLINGUAL | Status: DC | PRN

## 2016-03-30 MED ORDER — INSULIN ASPART 100 UNIT/ML ~~LOC~~ SOLN
0.0000 [IU] | Freq: Three times a day (TID) | SUBCUTANEOUS | Status: DC
  Administered 2016-03-31: 4 [IU] via SUBCUTANEOUS
  Administered 2016-03-31: 3 [IU] via SUBCUTANEOUS

## 2016-03-30 MED ORDER — CARVEDILOL 25 MG PO TABS
50.0000 mg | ORAL_TABLET | Freq: Two times a day (BID) | ORAL | Status: DC
  Administered 2016-03-30 – 2016-03-31 (×2): 50 mg via ORAL
  Filled 2016-03-30 (×2): qty 2

## 2016-03-30 MED ORDER — FENOFIBRATE 160 MG PO TABS
160.0000 mg | ORAL_TABLET | Freq: Every day | ORAL | Status: DC
  Administered 2016-03-30 – 2016-03-31 (×2): 160 mg via ORAL
  Filled 2016-03-30 (×2): qty 1

## 2016-03-30 MED ORDER — RANOLAZINE ER 500 MG PO TB12
500.0000 mg | ORAL_TABLET | Freq: Two times a day (BID) | ORAL | Status: DC
  Administered 2016-03-31: 500 mg via ORAL
  Filled 2016-03-30: qty 1

## 2016-03-30 MED ORDER — HEPARIN BOLUS VIA INFUSION
4000.0000 [IU] | Freq: Once | INTRAVENOUS | Status: AC
  Administered 2016-03-30: 4000 [IU] via INTRAVENOUS
  Filled 2016-03-30: qty 4000

## 2016-03-30 MED ORDER — ONDANSETRON HCL 4 MG/2ML IJ SOLN: 4.0000 mg | Freq: Four times a day (QID) | INTRAMUSCULAR | Status: DC | PRN

## 2016-03-30 MED ORDER — COLCHICINE 0.6 MG PO TABS
0.6000 mg | ORAL_TABLET | Freq: Two times a day (BID) | ORAL | Status: DC
  Administered 2016-03-30 – 2016-03-31 (×3): 0.6 mg via ORAL
  Filled 2016-03-30 (×3): qty 1

## 2016-03-30 MED ORDER — ASPIRIN EC 81 MG PO TBEC
81.0000 mg | DELAYED_RELEASE_TABLET | Freq: Every day | ORAL | Status: DC
Start: 2016-03-31 — End: 2016-03-31
  Administered 2016-03-31: 81 mg via ORAL
  Filled 2016-03-30: qty 1

## 2016-03-30 MED ORDER — PRAVASTATIN SODIUM 40 MG PO TABS
80.0000 mg | ORAL_TABLET | Freq: Every evening | ORAL | Status: DC
  Administered 2016-03-30: 80 mg via ORAL
  Filled 2016-03-30: qty 2

## 2016-03-30 MED ORDER — HYDRALAZINE HCL 25 MG PO TABS
25.0000 mg | ORAL_TABLET | Freq: Three times a day (TID) | ORAL | Status: DC
  Administered 2016-03-30 – 2016-03-31 (×3): 25 mg via ORAL
  Filled 2016-03-30 (×3): qty 1

## 2016-03-30 MED ORDER — HEPARIN (PORCINE) IN NACL 100-0.45 UNIT/ML-% IJ SOLN
1400.0000 [IU]/h | INTRAMUSCULAR | Status: DC
  Administered 2016-03-30 (×2): 1400 [IU]/h via INTRAVENOUS
  Filled 2016-03-30 (×3): qty 250

## 2016-03-30 MED ORDER — CLOPIDOGREL BISULFATE 75 MG PO TABS
75.0000 mg | ORAL_TABLET | Freq: Every day | ORAL | Status: DC
  Administered 2016-03-31: 75 mg via ORAL
  Filled 2016-03-30: qty 1

## 2016-03-30 MED ORDER — MORPHINE SULFATE (PF) 4 MG/ML IV SOLN: 4.0000 mg | Freq: Once | INTRAVENOUS | Status: DC

## 2016-03-30 MED ORDER — PANTOPRAZOLE SODIUM 40 MG PO TBEC
40.0000 mg | DELAYED_RELEASE_TABLET | Freq: Two times a day (BID) | ORAL | Status: DC
  Administered 2016-03-30 – 2016-03-31 (×2): 40 mg via ORAL
  Filled 2016-03-30 (×2): qty 1

## 2016-03-30 MED ORDER — GABAPENTIN 300 MG PO CAPS
300.0000 mg | ORAL_CAPSULE | Freq: Three times a day (TID) | ORAL | Status: DC
  Administered 2016-03-30 – 2016-03-31 (×3): 300 mg via ORAL
  Filled 2016-03-30 (×3): qty 1

## 2016-03-30 MED ORDER — AMLODIPINE BESYLATE 10 MG PO TABS
10.0000 mg | ORAL_TABLET | Freq: Every day | ORAL | Status: DC
  Administered 2016-03-31: 10 mg via ORAL
  Filled 2016-03-30: qty 1

## 2016-03-30 MED ORDER — NITROGLYCERIN IN D5W 200-5 MCG/ML-% IV SOLN
10.0000 ug/min | INTRAVENOUS | Status: DC
  Administered 2016-03-30: 10 ug/min via INTRAVENOUS
  Administered 2016-03-31: 15 ug/min via INTRAVENOUS
  Filled 2016-03-30 (×2): qty 250

## 2016-03-30 MED ORDER — ASPIRIN 325 MG PO TABS
325.0000 mg | ORAL_TABLET | Freq: Once | ORAL | Status: AC
  Administered 2016-03-30: 325 mg via ORAL
  Filled 2016-03-30: qty 1

## 2016-03-30 NOTE — ED Notes (Signed)
Pt back from Xray, Put back on monitor at this time.

## 2016-03-30 NOTE — ED Notes (Addendum)
Cp started at 0530 today,  some sob and nose stopped up, rads to left arm, 6 nitro sprays this am took plavix this am , taking tramdol for gout, chest is very sore to touch

## 2016-03-30 NOTE — ED Notes (Signed)
Wife states she thinks pt  took to much painmeds and pt has been upset that his cousin just died from a heart attack and pt was on the way to funeral

## 2016-03-30 NOTE — H&P (Signed)
History & Physical    Patient ID: Matthew STUEBER Sr. MRN: [id number], DOB/AGE: [birth year]   Admit date: 03/30/2016   Primary Physician: Barrie Lyme, FNP Primary Cardiologist: Dr. Aundra Dubin  Patient Profile    55 yo male with PMH of extensive CAD s/p RIMA--> RCA with Subsequent LAD and distal RCA stenting (3/15), hypertension, dyslipidemia, DM type II, GERD, CKD stage III, gout who presented to the [facility] ED with reports of centralized chest pressure with radiation into the left arm.  Past Medical History    Past Medical History  Diagnosis Date  . Dyslipidemia   . Coronary artery disease     a. INF MI 1999: PCI of RCA;  b.  extensive stenting of LAD;  c. s/p CABG with RIMA->RCA in 2006;  d. Stable, Low risk MV 05/2012; e. 09/2012 Cath: stable anatomy->med Rx. f. Abnl nuc 11/2013 -> med rx; g. 11/2013 Cath/PCI: LM 30d, LAD 50ost, 20/50isr, 100d, LCX 40-50ost, OM2 50ost, RI 70-80ost sup branch, RCA 20p, 90/60d(2.25x17 & 2.25x24 Promus DES'), PDA 60p, RIMA->RCA ok.  . Hypertensive heart disease   . Hx of echocardiogram     a. Echo 3/12: mild LVH, EF 55-60%, trivial AI  . Anginal pain ([place])   . Myocardial infarction [facility]) 1999; 2002; 2003; 2006; ~ 2008  . OSA on CPAP   . DM2 (diabetes mellitus, type 2) ([place])   . GERD (gastroesophageal reflux disease)   . History of gout   . CKD (chronic kidney disease), stage III   . Polycystic kidney disease     Past Surgical History  Procedure Laterality Date  . Cystectomy  1990's    off face  . Left heart catheterization with coronary/graft angiogram N/A 09/16/2012    Procedure: LEFT HEART CATHETERIZATION WITH [name];  Surgeon: [name], MD;  Location: [facility] CATH LAB;  Service: Cardiovascular;  Laterality: N/A;  . Left heart catheterization with coronary/graft angiogram N/A 12/02/2013    Procedure: LEFT HEART CATHETERIZATION WITH [name];  Surgeon: [name], MD;  Location: [place] CATH LAB;  Service:  Cardiovascular;  Laterality: N/A;  . Percutaneous coronary stent intervention (pci-s) N/A 12/03/2013    Procedure: PERCUTANEOUS CORONARY STENT INTERVENTION (PCI-S);  Surgeon: [name], MD;  Location: [facility] CATH LAB;  Service: Cardiovascular;  Laterality: N/A;  . Coronary artery bypass graft  2006    CABG X?; in [place]  . Coronary angioplasty with stent placement  1999 / 2002 / 2004 / 2006?  Marland Kitchen Cardiac catheterization  2012  . Cardiac catheterization  2013  . Cardiac catheterization  2014    LHC (1/14): total occlusion of distal LAD, diffuse disease in ramus, 70% proximal LCx, 50% proximal and mid RCA, 50% distal RCA, patent RIMA-RCA.     Allergies  Allergies  Allergen Reactions  . Ciprocin-Fluocin-Procin [Fluocinolone Acetonide] Rash  . Clarithromycin Itching and Other (See Comments)    "Biaxin" Eyes itch and burn  . Cleocin [Clindamycin Hcl] Anaphylaxis and Swelling  . Glimepiride [Amaryl] Other (See Comments)    Elevates liver function     History of Present Illness    [name] is a 55 year old male patient of Dr. Aundra Dubin with past medical history of extensive CAD s/p RIMA--> RCA with Subsequent LAD and distal RCA stenting (3/15), hypertension, dyslipidemia, DM type II, GERD, CKD stage III, gout. He is well-known to Dr. Aundra Dubin in reports having frequent angina episodes. At his last office visit the plan was to continue him on his antianginal  medications, with the addition of Ranexa. Apparently had difficulty getting his Ranexa field after that appointment, and was admitted on 03/24/2016 with sharp substernal chest pain, along with reports of gout in his right big toe. He was admitted for observation and ruled out, chest pain resolved with the resumption of his home medications. It was felt at that time that symptoms are related to noncompliance with medications. There were no ischemic EKG changes noted and serial enzymes were negative. He was sent home with colchicine and his  NSAIDs discontinued given his history of decreased renal function and cardiovascular disease. His last 2-D echo on 4/16 showed a normal EF of 50-55%, with no regional wall motion abnormality, and grade 1 diastolic dysfunction.   He reports feeling well post discharge, but attempted to fill his colchicine and outpatient setting but was unable to related to price. He recently saw his PCP on Monday who added tramadol to help with his gout pain. Denies having any chest pain since discharge up until this morning around 5:30 AM when he was awoken with centralized chest pressure with radiation into the left arm. States that time he checked his blood sugar which was 147 and took his diabetic medication, also proceeded to check his blood pressure which was 123XX123 systolic. Reports he then took his morning medications including most of his blood pressure medications, and 3 nitroglycerin sprays. This brought his pain from a 10/10-6/10. Of note reports eating a lunchable for breakfast that included ham. States that his symptoms persisted which concerned him so he proceeded to come to the ED for further evaluation. Denies any dyspnea, dizziness, lightheadedness, palpitations, abdominal or lower extremity edema, nausea or vomiting.  The ED his labs showed a creatinine of 2.7, negative POC troponin, hemoglobin 10.9, and chest x-ray without acute infiltrate or edema. EKG showed sinus rhythm with evolving left bundle-branch block, no acute ST/T-wave abnormalities. He was started on IV nitroglycerin, that has been titrated from 10 mcg-44mcgs, along with IV heparin. Currently rates his pain a 2/10 on a nitroglycerin drip.   Home Medications    Prior to Admission medications   Medication Sig Start Date End Date Taking? Authorizing Provider  acetaminophen (TYLENOL) 500 MG tablet Take 1,000 mg by mouth every 8 (eight) hours as needed.   Yes Historical Provider, MD  amLODipine (NORVASC) 10 MG tablet Take 1 tablet (10 mg total) by  mouth daily. 01/12/15  Yes Gay Filler Copland, MD  aspirin 81 MG tablet Take 1 tablet (81 mg total) by mouth daily. 12/04/13  Yes Rogelia Mire, NP  carvedilol (COREG) 25 MG tablet TAKE TWO TABLETS BY MOUTH TWICE DAILY 01/25/16  Yes Larey Dresser, MD  clopidogrel (PLAVIX) 75 MG tablet Take 1 tablet (75 mg total) by mouth daily. With breakfast. 01/23/16  Yes Larey Dresser, MD  colchicine 0.6 MG tablet Take 1 tablet (0.6 mg total) by mouth 2 (two) times daily. 03/25/16  Yes Bhavinkumar Bhagat, PA  fenofibrate (TRICOR) 145 MG tablet Take 1 tablet (145 mg total) by mouth daily. 10/26/15  Yes Larey Dresser, MD  gabapentin (NEURONTIN) 300 MG capsule Take 1 capsule (300 mg total) by mouth 3 (three) times daily. PATIENT NEEDS OFFICE VISIT FOR ADDITIONAL REFILLS Patient taking differently: Take 300-600 mg by mouth 2 (two) times daily. Take 600 mg every morning  Take 300 mg every evening. 03/31/15  Yes Gay Filler Copland, MD  hydrALAZINE (APRESOLINE) 25 MG tablet Take 1 tablet (25 mg total) by mouth 3 (three)  times daily. 01/24/16  Yes Larey Dresser, MD  hydrochlorothiazide (HYDRODIURIL) 25 MG tablet Take 25 mg by mouth daily. 03/12/16  Yes Historical Provider, MD  isosorbide mononitrate (IMDUR) 30 MG 24 hr tablet Take 90 mg by mouth daily.   Yes Historical Provider, MD  metFORMIN (GLUCOPHAGE) 500 MG tablet Take 250-500 mg by mouth 3 (three) times daily with meals. 500 mg at breakfast and dinner (or at bedtime), 250 mg at lunch   Yes Historical Provider, MD  Multiple Vitamin (MULTIVITAMIN WITH MINERALS) TABS tablet Take 1 tablet by mouth daily.   Yes Historical Provider, MD  nitroGLYCERIN (NITROLINGUAL) 0.4 MG/SPRAY spray Place 1 spray under the tongue every 5 (five) minutes x 3 doses as needed for chest pain. 01/23/16  Yes Larey Dresser, MD  Omega-3 Fatty Acids (FISH OIL) 1200 MG CAPS Take 1 capsule (1,200 mg total) by mouth 2 (two) times daily. 02/03/15  Yes Larey Dresser, MD  pantoprazole (PROTONIX) 40  MG tablet Take 40 mg by mouth 2 (two) times daily.   Yes Historical Provider, MD  pravastatin (PRAVACHOL) 80 MG tablet Take 1 tablet (80 mg total) by mouth every evening. 10/26/15  Yes Larey Dresser, MD  ranolazine (RANEXA) 500 MG 12 hr tablet Take 1 tablet (500 mg total) by mouth 2 (two) times daily. 12/14/15  Yes Larey Dresser, MD  traMADol (ULTRAM) 50 MG tablet Take 50 mg by mouth 3 (three) times daily. 03/28/16  Yes Historical Provider, MD  vitamin E 400 UNIT capsule Take 400 Units by mouth daily.   Yes Historical Provider, MD  zolpidem (AMBIEN) 10 MG tablet TAKE 1 TABLET AT BEDTIME Patient taking differently: TAKE 1 TABLET AT BEDTIME AS NEEDED 01/05/15  Yes Gay Filler Copland, MD  diphenhydrAMINE (BENADRYL) 25 mg capsule Take 25 mg by mouth daily as needed for allergies.    Historical Provider, MD  furosemide (LASIX) 20 MG tablet Take 1 tablet (20 mg total) by mouth daily. Patient not taking: Reported on 03/24/2016 07/17/15   Noemi Chapel, MD    Family History    Family History  Problem Relation Age of Onset  . Lung cancer Mother   . Anemia Mother   . Polycystic kidney disease Mother   . Diabetes Father   . Heart attack Father 84    Died suddenly  . Heart failure Father   . Hyperlipidemia Father   . Hypertension Father   . Kidney failure Sister 5    Died  . Heart attack Sister   . Hypertension Sister   . Diabetes Sister   . Diabetes Brother   . Polycystic kidney disease Brother   . Diabetes Sister     Social History    Social History   Social History  . Marital Status: Married    Spouse Name: [name]  . Number of Children: 5  . Years of Education: N/A   Occupational History  . retired     Chartered loss adjuster   Social History Main Topics  . Smoking status: Never Smoker   . Smokeless tobacco: Never Used     Comment: smoked some as a teenager (high school)  . Alcohol Use: No     Comment: 09/15/2012 "drank a little when I was young"  . Drug Use: No  . Sexual Activity:  Yes   Other Topics Concern  . Not on file   Social History Narrative   Married and lives with wife in [place]. On disability.    Consumes  about 2 Coke Zeros a day      Review of Systems    General:  No chills, fever, night sweats or weight changes.  Cardiovascular: See HPI Dermatological: No rash, lesions/masses Respiratory: See HPI Urologic: No hematuria, dysuria Abdominal:   No nausea, vomiting, diarrhea, bright red blood per rectum, melena, or hematemesis Neurologic:  No visual changes, wkns, changes in mental status. All other systems reviewed and are otherwise negative except as noted above.  Physical Exam    Blood pressure 154/82, pulse 81, temperature 98.4 F (36.9 C), resp. rate 18, SpO2 96 %.  General: Pleasant obese male, NAD Psych: Normal affect. Neuro: Alert and oriented X 3. Moves all extremities spontaneously. HEENT: Normal  Neck: Supple without bruits, difficult to access JVD due to girth. Lungs:  Resp regular and unlabored, CTA. Heart: RRR no s3, s4, or murmurs. Abdomen: Soft, obese, non-tender, non-distended, BS + x 4.  Extremities: No clubbing, cyanosis or edema. DP/PT/Radials 2+ and equal bilaterally.  Labs    Troponin [facility] Test)  Recent Labs  03/30/16 0843  TROPIPOC 0.00   No results for input(s): CKTOTAL, CKMB, TROPONINI in the last 72 hours. Lab Results  Component Value Date   WBC 7.7 03/30/2016   HGB 10.9* 03/30/2016   HCT 33.4* 03/30/2016   MCV 87.0 03/30/2016   PLT 217 03/30/2016    Recent Labs Lab 03/30/16 0840  NA 137  K 5.1  CL 111  CO2 18*  BUN 28*  CREATININE 2.77*  CALCIUM 9.5  PROT 6.7  BILITOT 0.5  ALKPHOS 56  ALT 22  AST 21  GLUCOSE 211*   Lab Results  Component Value Date   CHOL 177 03/25/2016   HDL 27* 03/25/2016   LDLCALC 74 03/25/2016   TRIG 382* 03/25/2016   Lab Results  Component Value Date   DDIMER  12/01/2010    <0.22        AT THE INHOUSE ESTABLISHED CUTOFF VALUE OF 0.48 ug/mL FEU, THIS  ASSAY HAS BEEN DOCUMENTED IN THE LITERATURE TO HAVE A SENSITIVITY AND NEGATIVE PREDICTIVE VALUE OF AT LEAST 98 TO 99%.  THE TEST RESULT SHOULD BE CORRELATED WITH AN ASSESSMENT OF THE CLINICAL PROBABILITY OF DVT / VTE.     Radiology Studies    Dg Chest 2 View  03/30/2016  CLINICAL DATA:  Left chest pain EXAM: CHEST  2 VIEW COMPARISON:  03/24/2016 FINDINGS: Lungs are clear.  No pleural effusion or pneumothorax. Heart is top-normal in size. Mild degenerative changes of the visualized thoracolumbar spine. Median sternotomy. IMPRESSION: No evidence of acute cardiopulmonary disease. Electronically Signed   By: Julian Hy M.D.   On: 03/30/2016 09:19   ECG & Cardiac Imaging    EKG: SR, LBBB no acute ST/ T wave abnormalities   ECHO: 12/2014  Study Conclusions  - Left ventricle: The cavity size was normal. Wall thickness was  increased in a pattern of mild LVH. Systolic function was normal.  The estimated ejection fraction was in the range of 50% to 55%.  Wall motion was normal; there were no regional wall motion  abnormalities. Doppler parameters are consistent with abnormal  left ventricular relaxation (grade 1 diastolic dysfunction). - Aortic valve: There was very mild stenosis. There was mild  regurgitation. Valve area (VTI): 1.7 cm^2. Valve area (Vmax):  1.61 cm^2. Valve area (Vmean): 1.54 cm^2. - Mitral valve: Calcified annulus. Mildly thickened leaflets .  There was mild regurgitation.   Assessment & Plan    Mr.  Nagarajan is a 55 year old male patient of Dr. Aundra Dubin with past medical history of extensive CAD s/p RIMA--> RCA with Subsequent LAD and distal RCA stenting (3/15), hypertension, dyslipidemia, DM type II, GERD, CKD stage III, gout. He is well-known to Dr. Aundra Dubin in reports having frequent angina episodes. At his last office visit the plan was to continue him on his antianginal medications, with the addition of Ranexa. Apparently had difficulty getting his Ranexa  field after that appointment, and was admitted on 03/24/2016 with sharp substernal chest pain, along with reports of gout in his right big toe. He was admitted for observation and ruled out, chest pain resolved with the resumption of his home medications. It was felt at that time that symptoms are related to noncompliance with medications. There were no ischemic EKG changes noted and serial enzymes were negative. He was sent home with colchicine and his NSAIDs discontinued given his history of decreased renal function and cardiovascular disease. His last 2-D echo on 4/16 showed a normal EF of 50-55%, with no regional wall motion abnormality, and grade 1 diastolic dysfunction.  1. Chest pain: Reports waking up this morning around 530am with centralized chest pain that radiated down into the left arm He proceeded to take 3 nitroglycerin sprays, which reduced his pain from 10/10 to 6/10. Reports taking most of his blood pressure medications, but his systolic pressure was [id number]. Also reports recent death of family member has has been under increased stress over the past couple of days. Denies any associated symptoms with the chest pressure. In the ED initial POC trop was negative. EKG showed SR without acute ST/T wave changes. He was started on IV nitro and heparin by the EDP.  -- Admit to stepdown -- Cycle trops, continue heparin for now -- Continue IV nitro, will start back home medications in attempts to wean nitro gtt.   2. HTN: Controlled in the ED -- Continue amlodipine, coreg  3. HL: Continue statin, and fenofibrate -- Lipids checked last admission 03/24/16  4. DM II: Hold metformin -- SSI  5. GERD: Continue protonix  6. CKD IIl: Admission Cr 2.77, which is slightly higher from previous admission. Monitor BMET -- Avoid ACE/ARB given renal function  7. Gout: -- Hold HCTZ -- Continue cochicine  Signed, Reino Bellis, NP-C Pager [phone] 03/30/2016, 1:30 PM  Patient seen and examined  Agree with findings as noted above  Known CAD  Last cath in 2015  Now with CP   Currently feeling better on NTG IV On exam:  Lungs CTA  Cardiac RRR  NO S3  Ext   NO edema  (gout in R gr toe) Will continue NTG  Home meds  Cycle enzymes   WIth renal dysfunctrion would prefer to maximize meds   . Recheck BMET in AM  [employer]

## 2016-03-30 NOTE — ED Notes (Signed)
Cardiologist at bedside.  

## 2016-03-30 NOTE — Progress Notes (Signed)
03/30/16  Pharmacy-  Heparin 1933   Heparin level 0.47   A/P:   55yo male with chest pain/ACS, on heparin 1400 units/hr.  Heparin level within goal range of 0.3-0.7.  No bleeding problems noted.  1-  Continue current rate 2-  F/U in AM   Gracy Bruins, PharmD [facility]

## 2016-03-30 NOTE — ED Provider Notes (Signed)
CSN: [id number]     Arrival date & time 03/30/16  [id number] History   First MD Initiated Contact with Patient 03/30/16 [phone]     Chief Complaint  Patient presents with  . Chest Pain     (Consider location/radiation/quality/duration/timing/severity/associated sxs/prior Treatment) The history is provided by the patient.  Matthew SARKISYAN Sr. is a 55 y.o. male history of CAD with CABG, cardiac stent, diabetes, hypertension here presenting with chest pain. Patient has acute onset of chest pain at 5:30 AM. States that associated with some shortness of breath and radiated to the left arm. He refused 6 nitroglycerin sprays that minimally improved his pain and pain is still 9 out of 10 right now. Patient states that he was admitted recently to cardiology service and due to his renal failure, decided not to cath him and just to rule him out. Denies any abdominal pain or fevers or vomiting. Took some tramadol for gout but didn't help his chest pain.    Past Medical History  Diagnosis Date  . Dyslipidemia   . Coronary artery disease     a. INF MI 1999: PCI of RCA;  b.  extensive stenting of LAD;  c. s/p CABG with RIMA->RCA in 2006;  d. Stable, Low risk MV 05/2012; e. 09/2012 Cath: stable anatomy->med Rx. f. Abnl nuc 11/2013 -> med rx; g. 11/2013 Cath/PCI: LM 30d, LAD 50ost, 20/50isr, 100d, LCX 40-50ost, OM2 50ost, RI 70-80ost sup branch, RCA 20p, 90/60d(2.25x17 & 2.25x24 Promus DES'), PDA 60p, RIMA->RCA ok.  . Hypertensive heart disease   . Hx of echocardiogram     a. Echo 3/12: mild LVH, EF 55-60%, trivial AI  . Anginal pain ([place])   . Myocardial infarction [facility]) 1999; 2002; 2003; 2006; ~ 2008  . OSA on CPAP   . DM2 (diabetes mellitus, type 2) ([place])   . GERD (gastroesophageal reflux disease)   . History of gout   . CKD (chronic kidney disease), stage III   . Polycystic kidney disease    Past Surgical History  Procedure Laterality Date  . Cystectomy  1990's    off face  . Left heart catheterization with  coronary/graft angiogram N/A 09/16/2012    Procedure: LEFT HEART CATHETERIZATION WITH [name];  Surgeon: [name], MD;  Location: [facility] CATH LAB;  Service: Cardiovascular;  Laterality: N/A;  . Left heart catheterization with coronary/graft angiogram N/A 12/02/2013    Procedure: LEFT HEART CATHETERIZATION WITH [name];  Surgeon: [name], MD;  Location: [facility] CATH LAB;  Service: Cardiovascular;  Laterality: N/A;  . Percutaneous coronary stent intervention (pci-s) N/A 12/03/2013    Procedure: PERCUTANEOUS CORONARY STENT INTERVENTION (PCI-S);  Surgeon: [name], MD;  Location: [facility] CATH LAB;  Service: Cardiovascular;  Laterality: N/A;  . Coronary artery bypass graft  2006    CABG X?; in [place]  . Coronary angioplasty with stent placement  1999 / 2002 / 2004 / 2006?  Marland Kitchen Cardiac catheterization  2012  . Cardiac catheterization  2013  . Cardiac catheterization  2014    LHC (1/14): total occlusion of distal LAD, diffuse disease in ramus, 70% proximal LCx, 50% proximal and mid RCA, 50% distal RCA, patent RIMA-RCA.   Family History  Problem Relation Age of Onset  . Lung cancer Mother   . Anemia Mother   . Polycystic kidney disease Mother   . Diabetes Father   . Heart attack Father 47    Died suddenly  . Heart failure Father   . Hyperlipidemia Father   .  Hypertension Father   . Kidney failure Sister 5    Died  . Heart attack Sister   . Hypertension Sister   . Diabetes Sister   . Diabetes Brother   . Polycystic kidney disease Brother   . Diabetes Sister    Social History  Substance Use Topics  . Smoking status: Never Smoker   . Smokeless tobacco: Never Used     Comment: smoked some as a teenager (high school)  . Alcohol Use: No     Comment: 09/15/2012 "drank a little when I was young"    Review of Systems  Cardiovascular: Positive for chest pain.  All other systems reviewed and are negative.     Allergies  Ciprocin-fluocin-procin;  Clarithromycin; Cleocin; and Glimepiride  Home Medications   Prior to Admission medications   Medication Sig Start Date End Date Taking? Authorizing Provider  acetaminophen (TYLENOL) 500 MG tablet Take 1,000 mg by mouth every 8 (eight) hours as needed.   Yes Historical Provider, MD  amLODipine (NORVASC) 10 MG tablet Take 1 tablet (10 mg total) by mouth daily. 01/12/15  Yes Gay Filler Copland, MD  aspirin 81 MG tablet Take 1 tablet (81 mg total) by mouth daily. 12/04/13  Yes Rogelia Mire, NP  carvedilol (COREG) 25 MG tablet TAKE TWO TABLETS BY MOUTH TWICE DAILY 01/25/16  Yes Larey Dresser, MD  clopidogrel (PLAVIX) 75 MG tablet Take 1 tablet (75 mg total) by mouth daily. With breakfast. 01/23/16  Yes Larey Dresser, MD  colchicine 0.6 MG tablet Take 1 tablet (0.6 mg total) by mouth 2 (two) times daily. 03/25/16  Yes Bhavinkumar Bhagat, PA  fenofibrate (TRICOR) 145 MG tablet Take 1 tablet (145 mg total) by mouth daily. 10/26/15  Yes Larey Dresser, MD  gabapentin (NEURONTIN) 300 MG capsule Take 1 capsule (300 mg total) by mouth 3 (three) times daily. PATIENT NEEDS OFFICE VISIT FOR ADDITIONAL REFILLS Patient taking differently: Take 300-600 mg by mouth 2 (two) times daily. Take 600 mg every morning  Take 300 mg every evening. 03/31/15  Yes Gay Filler Copland, MD  hydrALAZINE (APRESOLINE) 25 MG tablet Take 1 tablet (25 mg total) by mouth 3 (three) times daily. 01/24/16  Yes Larey Dresser, MD  hydrochlorothiazide (HYDRODIURIL) 25 MG tablet Take 25 mg by mouth daily. 03/12/16  Yes Historical Provider, MD  isosorbide mononitrate (IMDUR) 30 MG 24 hr tablet Take 90 mg by mouth daily.   Yes Historical Provider, MD  metFORMIN (GLUCOPHAGE) 500 MG tablet Take 250-500 mg by mouth 3 (three) times daily with meals. 500 mg at breakfast and dinner (or at bedtime), 250 mg at lunch   Yes Historical Provider, MD  Multiple Vitamin (MULTIVITAMIN WITH MINERALS) TABS tablet Take 1 tablet by mouth daily.   Yes Historical  Provider, MD  nitroGLYCERIN (NITROLINGUAL) 0.4 MG/SPRAY spray Place 1 spray under the tongue every 5 (five) minutes x 3 doses as needed for chest pain. 01/23/16  Yes Larey Dresser, MD  Omega-3 Fatty Acids (FISH OIL) 1200 MG CAPS Take 1 capsule (1,200 mg total) by mouth 2 (two) times daily. 02/03/15  Yes Larey Dresser, MD  pantoprazole (PROTONIX) 40 MG tablet Take 40 mg by mouth 2 (two) times daily.   Yes Historical Provider, MD  pravastatin (PRAVACHOL) 80 MG tablet Take 1 tablet (80 mg total) by mouth every evening. 10/26/15  Yes Larey Dresser, MD  ranolazine (RANEXA) 500 MG 12 hr tablet Take 1 tablet (500 mg total) by mouth 2 (  two) times daily. 12/14/15  Yes Larey Dresser, MD  traMADol (ULTRAM) 50 MG tablet Take 50 mg by mouth 3 (three) times daily. 03/28/16  Yes Historical Provider, MD  vitamin E 400 UNIT capsule Take 400 Units by mouth daily.   Yes Historical Provider, MD  zolpidem (AMBIEN) 10 MG tablet TAKE 1 TABLET AT BEDTIME Patient taking differently: TAKE 1 TABLET AT BEDTIME AS NEEDED 01/05/15  Yes Gay Filler Copland, MD  diphenhydrAMINE (BENADRYL) 25 mg capsule Take 25 mg by mouth daily as needed for allergies.    Historical Provider, MD  furosemide (LASIX) 20 MG tablet Take 1 tablet (20 mg total) by mouth daily. Patient not taking: Reported on 03/24/2016 07/17/15   Noemi Chapel, MD   BP 133/96 mmHg  Pulse 95  Temp(Src) 98.4 F (36.9 C)  Resp 16  SpO2 100% Physical Exam  Constitutional: He is oriented to person, place, and time.  Uncomfortable   HENT:  Head: Normocephalic.  Mouth/Throat: Oropharynx is clear and moist.  Eyes: Conjunctivae are normal. Pupils are equal, round, and reactive to light.  Neck: Normal range of motion. Neck supple.  Cardiovascular: Normal rate, regular rhythm and normal heart sounds.   Pulmonary/Chest: Effort normal and breath sounds normal.  Mild reproducible R sided chest wall tenderness   Abdominal: Soft. Bowel sounds are normal. He exhibits no  distension. There is no tenderness. There is no rebound.  Musculoskeletal: Normal range of motion. He exhibits no edema or tenderness.  Neurological: He is alert and oriented to person, place, and time. No cranial nerve deficit. Coordination normal.  Skin: Skin is warm and dry.  Psychiatric: He has a normal mood and affect. His behavior is normal. Judgment and thought content normal.  Nursing note and vitals reviewed.   ED Course  Procedures (including critical care time)  CRITICAL CARE Performed by: Darl Householder, Leva Baine   Total critical care time: 30 minutes  Critical care time was exclusive of separately billable procedures and treating other patients.  Critical care was necessary to treat or prevent imminent or life-threatening deterioration.  Critical care was time spent personally by me on the following activities: development of treatment plan with patient and/or surrogate as well as nursing, discussions with consultants, evaluation of patient's response to treatment, examination of patient, obtaining history from patient or surrogate, ordering and performing treatments and interventions, ordering and review of laboratory studies, ordering and review of radiographic studies, pulse oximetry and re-evaluation of patient's condition.  Labs Review Labs Reviewed  CBC WITH DIFFERENTIAL/PLATELET - Abnormal; Notable for the following:    RBC 3.84 (*)    Hemoglobin 10.9 (*)    HCT 33.4 (*)    All other components within normal limits  COMPREHENSIVE METABOLIC PANEL - Abnormal; Notable for the following:    CO2 18 (*)    Glucose, Bld 211 (*)    BUN 28 (*)    Creatinine, Ser 2.77 (*)    GFR calc non Af Amer 24 (*)    GFR calc Af Amer 28 (*)    All other components within normal limits  HEPARIN LEVEL (UNFRACTIONATED)  Randolm Idol, ED    Imaging Review Dg Chest 2 View  03/30/2016  CLINICAL DATA:  Left chest pain EXAM: CHEST  2 VIEW COMPARISON:  03/24/2016 FINDINGS: Lungs are clear.  No  pleural effusion or pneumothorax. Heart is top-normal in size. Mild degenerative changes of the visualized thoracolumbar spine. Median sternotomy. IMPRESSION: No evidence of acute cardiopulmonary disease. Electronically Signed  By: Julian Hy M.D.   On: 03/30/2016 09:19   I have personally reviewed and evaluated these images and lab results as part of my medical decision-making.   EKG Interpretation   Date/Time:  Friday March 30 2016 08:26:25 EDT Ventricular Rate:  95 PR Interval:    QRS Duration: 123 QT Interval:  358 QTC Calculation: 450 R Axis:   -63 Text Interpretation:  Sinus rhythm Probable left atrial enlargement Left  bundle branch block No significant change since last tracing Confirmed by  Ridley Schewe  MD, Daphene Chisholm ([id number]) on 03/30/2016 8:31:58 AM      MDM   Final diagnoses:  None   Matthew Kirks Sr. is a 55 y.o. male here with chest pain. Concerned for unstable angina. Still has 9/10 pain despite nitro. Recently admitted to cardiology service and was ruled out for MI with labs but no stress test or cath was done. Will start nitro drip. Will consult cardiology again.   9:52 AM CXR unremarkable. Cr 2.7, slightly worse than baseline. Started on heparin drip, nitro drip. Cardiology consulted.   2 pm Cardiology saw patient. Will admit.   Drenda Freeze, MD 03/30/16 [phone]

## 2016-03-30 NOTE — Progress Notes (Signed)
ANTICOAGULATION CONSULT NOTE - Initial Consult  Pharmacy Consult for Heparin  Indication: chest pain/ACS  Allergies  Allergen Reactions  . Ciprocin-Fluocin-Procin [Fluocinolone Acetonide] Rash  . Clarithromycin Itching and Other (See Comments)    "Biaxin" Eyes itch and burn  . Cleocin [Clindamycin Hcl] Anaphylaxis and Swelling  . Glimepiride [Amaryl] Other (See Comments)    Elevates liver function     Patient Measurements: Heparin Dosing Weight: 86.3kg Current Total Body Weight: 108.4   Vital Signs: Temp: 98.4 F (36.9 C) (07/21 0830) BP: 133/96 mmHg (07/21 0830) Pulse Rate: 95 (07/21 0830)  Labs:  Recent Labs  03/30/16 0840  HGB 10.9*  HCT 33.4*  PLT 217  CREATININE 2.77*    Estimated Creatinine Clearance: 33.8 mL/min (by C-G formula based on Cr of 2.77).   Medical History: Past Medical History  Diagnosis Date  . Dyslipidemia   . Coronary artery disease     a. INF MI 1999: PCI of RCA;  b.  extensive stenting of LAD;  c. s/p CABG with RIMA->RCA in 2006;  d. Stable, Low risk MV 05/2012; e. 09/2012 Cath: stable anatomy->med Rx. f. Abnl nuc 11/2013 -> med rx; g. 11/2013 Cath/PCI: LM 30d, LAD 50ost, 20/50isr, 100d, LCX 40-50ost, OM2 50ost, RI 70-80ost sup branch, RCA 20p, 90/60d(2.25x17 & 2.25x24 Promus DES'), PDA 60p, RIMA->RCA ok.  . Hypertensive heart disease   . Hx of echocardiogram     a. Echo 3/12: mild LVH, EF 55-60%, trivial AI  . Anginal pain ([place])   . Myocardial infarction [facility]) 1999; 2002; 2003; 2006; ~ 2008  . OSA on CPAP   . DM2 (diabetes mellitus, type 2) ([place])   . GERD (gastroesophageal reflux disease)   . History of gout   . CKD (chronic kidney disease), stage III   . Polycystic kidney disease     Assessment: 55 yo male with chest pain and SOB. Recently admitted for CP, d/c on 7/16. Pharmacy consulted for heparin gtt. On baby ASA and plavix at home. No other anticoagulation. Hgb 10.9, hct 33.4, plt 217. VSS. No s/s bleeding.   Based on prior  admission data, heparin gtt started at slightly increased dose (~16 units/kg/hr by heparin dosing weight).   Goal of Therapy:  Heparin level 0.3-0.7 units/ml Monitor platelets by anticoagulation protocol: Yes   Plan:  Give 4000 units bolus x 1 Start heparin infusion at 1400 units/hr Check anti-Xa level in 6 hours and daily while on heparin Continue to monitor H&H and platelets  Honor Loh, PharmD Pharmacy Resident  03/30/2016,9:45 AM  Pager [phone]

## 2016-03-30 NOTE — ED Notes (Signed)
Cardiology NP at bedside.

## 2016-03-31 DIAGNOSIS — E785 Hyperlipidemia, unspecified: Secondary | ICD-10-CM | POA: Diagnosis not present

## 2016-03-31 DIAGNOSIS — I10 Essential (primary) hypertension: Secondary | ICD-10-CM | POA: Diagnosis not present

## 2016-03-31 DIAGNOSIS — I25119 Atherosclerotic heart disease of native coronary artery with unspecified angina pectoris: Secondary | ICD-10-CM | POA: Diagnosis not present

## 2016-03-31 DIAGNOSIS — N183 Chronic kidney disease, stage 3 (moderate): Secondary | ICD-10-CM | POA: Diagnosis not present

## 2016-03-31 DIAGNOSIS — Z955 Presence of coronary angioplasty implant and graft: Secondary | ICD-10-CM

## 2016-03-31 DIAGNOSIS — I209 Angina pectoris, unspecified: Secondary | ICD-10-CM | POA: Diagnosis not present

## 2016-03-31 DIAGNOSIS — I25768 Atherosclerosis of bypass graft of coronary artery of transplanted heart with other forms of angina pectoris: Secondary | ICD-10-CM

## 2016-03-31 DIAGNOSIS — Z9114 Patient's other noncompliance with medication regimen: Secondary | ICD-10-CM

## 2016-03-31 DIAGNOSIS — I200 Unstable angina: Secondary | ICD-10-CM | POA: Diagnosis not present

## 2016-03-31 LAB — CBC
HCT: 31.9 % — ABNORMAL LOW (ref 39.0–52.0)
HEMOGLOBIN: 10.2 g/dL — AB (ref 13.0–17.0)
MCH: 27.9 pg (ref 26.0–34.0)
MCHC: 32 g/dL (ref 30.0–36.0)
MCV: 87.4 fL (ref 78.0–100.0)
Platelets: 224 10*3/uL (ref 150–400)
RBC: 3.65 MIL/uL — ABNORMAL LOW (ref 4.22–5.81)
RDW: 12.9 % (ref 11.5–15.5)
WBC: 7.1 10*3/uL (ref 4.0–10.5)

## 2016-03-31 LAB — HEPARIN LEVEL (UNFRACTIONATED): HEPARIN UNFRACTIONATED: 0.51 [IU]/mL (ref 0.30–0.70)

## 2016-03-31 LAB — BASIC METABOLIC PANEL
ANION GAP: 8 (ref 5–15)
BUN: 28 mg/dL — ABNORMAL HIGH (ref 6–20)
CALCIUM: 8.9 mg/dL (ref 8.9–10.3)
CO2: 19 mmol/L — ABNORMAL LOW (ref 22–32)
Chloride: 109 mmol/L (ref 101–111)
Creatinine, Ser: 2.66 mg/dL — ABNORMAL HIGH (ref 0.61–1.24)
GFR, EST AFRICAN AMERICAN: 29 mL/min — AB (ref >60)
GFR, EST NON AFRICAN AMERICAN: 25 mL/min — AB (ref >60)
GLUCOSE: 111 mg/dL — AB (ref 65–99)
Potassium: 4.7 mmol/L (ref 3.5–5.1)
SODIUM: 136 mmol/L (ref 135–145)

## 2016-03-31 LAB — GLUCOSE, CAPILLARY
GLUCOSE-CAPILLARY: 142 mg/dL — AB (ref 65–99)
GLUCOSE-CAPILLARY: 152 mg/dL — AB (ref 65–99)

## 2016-03-31 LAB — TROPONIN I

## 2016-03-31 MED ORDER — ISOSORBIDE MONONITRATE ER 60 MG PO TB24
90.0000 mg | ORAL_TABLET | Freq: Every day | ORAL | Status: DC
  Administered 2016-03-31: 90 mg via ORAL
  Filled 2016-03-31: qty 1

## 2016-03-31 MED ORDER — ISOSORBIDE MONONITRATE ER 30 MG PO TB24: 90.0000 mg | ORAL_TABLET | Freq: Every day | ORAL | Status: DC

## 2016-03-31 NOTE — Progress Notes (Signed)
Discussed discharge summary with patient and wife.  Provided discharge education.  Patient released to home with family and belongings.  Patient expressed understanding.  Matthew Rowe

## 2016-03-31 NOTE — Progress Notes (Signed)
SUBJECTIVE: Denies chest pain and shortness of breath. Had not been taking Ranexa as outpatient. Encouraged not to miss meds. Still on IV heparin and nitro.   ROS: Other than pertinent positives in "Subjective", all others were reviewed and found to be negative.   Intake/Output Summary (Last 24 hours) at 03/31/16 [id number] Last data filed at 03/31/16 0900  Gross per 24 hour  Intake 1776.93 ml  Output   1250 ml  Net 526.93 ml    Current Facility-Administered Medications  Medication Dose Route Frequency Provider Last Rate Last Dose  . acetaminophen (TYLENOL) tablet 650 mg  650 mg Oral Q4H PRN Cheryln Manly, NP   650 mg at 03/30/16 1932  . amLODipine (NORVASC) tablet 10 mg  10 mg Oral Daily Cheryln Manly, NP      . aspirin EC tablet 81 mg  81 mg Oral Daily Cheryln Manly, NP      . carvedilol (COREG) tablet 50 mg  50 mg Oral BID Cheryln Manly, NP   50 mg at 03/30/16 2132  . clopidogrel (PLAVIX) tablet 75 mg  75 mg Oral Daily Cheryln Manly, NP      . colchicine tablet 0.6 mg  0.6 mg Oral BID Cheryln Manly, NP   0.6 mg at 03/30/16 2132  . fenofibrate tablet 160 mg  160 mg Oral Daily Cheryln Manly, NP   160 mg at 03/30/16 1737  . gabapentin (NEURONTIN) capsule 300 mg  300 mg Oral TID Cheryln Manly, NP   300 mg at 03/30/16 2132  . hydrALAZINE (APRESOLINE) tablet 25 mg  25 mg Oral TID Cheryln Manly, NP   25 mg at 03/30/16 2131  . insulin aspart (novoLOG) injection 0-20 Units  0-20 Units Subcutaneous TID WC Cheryln Manly, NP   3 Units at 03/31/16 0831  . isosorbide mononitrate (IMDUR) 24 hr tablet 90 mg  90 mg Oral Daily Herminio Commons, MD      . nitroGLYCERIN (NITROSTAT) SL tablet 0.4 mg  0.4 mg Sublingual Q5 Min x 3 PRN Cheryln Manly, NP      . ondansetron [employer]) injection 4 mg  4 mg Intravenous Q6H PRN Cheryln Manly, NP      . pantoprazole (PROTONIX) EC tablet 40 mg  40 mg Oral BID Cheryln Manly, NP   40 mg at 03/30/16 2132  .  pravastatin (PRAVACHOL) tablet 80 mg  80 mg Oral QPM Cheryln Manly, NP   80 mg at 03/30/16 1738  . ranolazine (RANEXA) 12 hr tablet 500 mg  500 mg Oral BID Cheryln Manly, NP        Filed Vitals:   03/30/16 2345 03/30/16 2356 03/31/16 0500 03/31/16 0747  BP: 121/72 110/63 109/64 112/69  Pulse:  79 82 82  Temp:  97.8 F (36.6 C) 98.1 F (36.7 C) 98 F (36.7 C)  TempSrc:    Oral  Resp:  18 17 21   Weight:   241 lb 1.6 oz (109.362 kg)   SpO2: 99% 100% 98% 99%    PHYSICAL EXAM General: NAD HEENT: Normal. Neck: No JVD, no thyromegaly.  Lungs: Clear to auscultation bilaterally with normal respiratory effort. CV: Nondisplaced PMI.  Regular rate and rhythm, normal S1/S2, no S3/S4, no murmur.  No pretibial edema.    Abdomen: Obese.  Neurologic: Alert and oriented x 3.  Psych: Normal affect. Musculoskeletal: No gross deformities. Extremities: No clubbing or cyanosis.  LABS: Basic Metabolic Panel:  Recent Labs  03/30/16 0840 03/31/16 0218  NA 137 136  K 5.1 4.7  CL 111 109  CO2 18* 19*  GLUCOSE 211* 111*  BUN 28* 28*  CREATININE 2.77* 2.66*  CALCIUM 9.5 8.9   Liver Function Tests:  Recent Labs  03/30/16 0840  AST 21  ALT 22  ALKPHOS 56  BILITOT 0.5  PROT 6.7  ALBUMIN 4.0   No results for input(s): LIPASE, AMYLASE in the last 72 hours. CBC:  Recent Labs  03/30/16 0840 03/31/16 0218  WBC 7.7 7.1  NEUTROABS 5.0  --   HGB 10.9* 10.2*  HCT 33.4* 31.9*  MCV 87.0 87.4  PLT 217 224   Cardiac Enzymes:  Recent Labs  03/30/16 1541 03/30/16 2037 03/31/16 0218  TROPONINI <0.03 <0.03 <0.03   BNP: Invalid input(s): POCBNP D-Dimer: No results for input(s): DDIMER in the last 72 hours. Hemoglobin A1C: No results for input(s): HGBA1C in the last 72 hours. Fasting Lipid Panel: No results for input(s): CHOL, HDL, LDLCALC, TRIG, CHOLHDL, LDLDIRECT in the last 72 hours. Thyroid Function Tests: No results for input(s): TSH, T4TOTAL, T3FREE, THYROIDAB  in the last 72 hours.  Invalid input(s): FREET3 Anemia Panel: No results for input(s): VITAMINB12, FOLATE, FERRITIN, TIBC, IRON, RETICCTPCT in the last 72 hours.  RADIOLOGY: Dg Chest 2 View  03/30/2016  CLINICAL DATA:  Left chest pain EXAM: CHEST  2 VIEW COMPARISON:  03/24/2016 FINDINGS: Lungs are clear.  No pleural effusion or pneumothorax. Heart is top-normal in size. Mild degenerative changes of the visualized thoracolumbar spine. Median sternotomy. IMPRESSION: No evidence of acute cardiopulmonary disease. Electronically Signed   By: Julian Hy M.D.   On: 03/30/2016 09:19   Dg Chest 2 View  03/24/2016  CLINICAL DATA:  Chest pain EXAM: CHEST  2 VIEW COMPARISON:  07/17/2015. FINDINGS: The heart size and mediastinal contours are within normal limits. Both lungs are clear. The visualized skeletal structures are unremarkable. IMPRESSION: No active cardiopulmonary disease. Electronically Signed   By: Misty Stanley M.D.   On: 03/24/2016 14:18      ASSESSMENT AND PLAN: Mr. Thone is a 55 year old male patient of Dr. Aundra Dubin with past medical history of extensive CAD s/p RIMA--> RCA with Subsequent LAD and distal RCA stenting (3/15), hypertension, dyslipidemia, DM type II, GERD, CKD stage III, gout. He is well-known to Dr. Aundra Dubin in reports having frequent angina episodes. At his last office visit the plan was to continue him on his antianginal medications, with the addition of Ranexa. Apparently had difficulty getting his Ranexa field after that appointment, and was admitted on 03/24/2016 with sharp substernal chest pain, along with reports of gout in his right big toe. He was admitted for observation and ruled out, chest pain resolved with the resumption of his home medications. It was felt at that time that symptoms are related to noncompliance with medications. There were no ischemic EKG changes noted and serial enzymes were negative. He was sent home with colchicine and his NSAIDs discontinued  given his history of decreased renal function and cardiovascular disease. His last 2-D echo on 4/16 showed a normal EF of 50-55%, with no regional wall motion abnormality, and grade 1 diastolic dysfunction.  1. Chest pain in context of CAD: No further episodes. EKG showed SR without acute ST/T wave changes. He was started on IV nitro and heparin upon admission.  -troponins negative -will dc IV heparin and nitro and resume Imdur 90 mg. Continue Ranexa 500 mg bid  along with ASA, Coreg, Plavix, and statin.  2. Essential HTN: -controlled. No changes.  3. Hyperlipidemia: Continue statin, and fenofibrate -- Lipids checked last admission 03/24/16  4. CKD IIl: Admission Cr 2.77, 2.66 today. Avoid ACE/ARB given renal function  Dispo: DC to home and fu with Dr. Aundra Dubin.   Kate Sable, M.D., F.A.C.C.

## 2016-03-31 NOTE — Discharge Instructions (Signed)
Acute Coronary Syndrome  Acute coronary syndrome (ACS) is a serious problem in which there is suddenly not enough blood and oxygen supplied to the heart. ACS may mean that one or more of the blood vessels in your heart (coronary arteries) may be blocked. ACS can result in chest pain or a heart attack (myocardial infarction or MI).  CAUSES  This condition is caused by atherosclerosis, which is the buildup of fat and cholesterol (plaque) on the inside of the arteries. Over time, the plaque may narrow or block the artery, and this will lessen blood flow to the heart. Plaque can also become weak and break off within a coronary artery to form a clot and cause a sudden blockage.  RISK FACTORS  The risks factors of this condition include:   High cholesterol levels.   High blood pressure (hypertension).   Smoking.   Diabetes.   Age.   Family history of chest pain, heart disease, or stroke.   Lack of exercise.  SYMPTOMS  The most common signs of this condition include:   Chest pain, which can be:    A crushing or squeezing in the chest.    A tightness, pressure, fullness, or heaviness in the chest.    Present for more than a few minutes, or it can stop and recur.   Pain in the arms, neck, jaw, or back.   Unexplained heartburn or indigestion.   Shortness of breath.   Nausea.   Sudden cold sweats.   Feeling light-headed or dizzy.  Sometimes, this condition has no symptoms.  DIAGNOSIS  ACS may be diagnosed through the following tests:   Electrocardiogram (ECG).   Blood tests.   Coronary angiogram. This is a procedure to look at the coronary arteries to see if there is any blockage.  TREATMENT  Treatment for ACS may include:   Healthy behavioral changes to reduce or control risk factors.   Medicine.   Coronary stenting.A stent helps to keep an artery open.   Coronary angioplasty. This procedure widens a narrowed or blocked artery.   Coronary artery bypass surgery. This will allow your blood to pass the  blockage (bypass) to reach your heart.  HOME CARE INSTRUCTIONS  Eating and Drinking   Follow a heart-healthy diet. A dietitian can you help to educate you about healthy food options and changes.   Use healthy cooking methods such as roasting, grilling, broiling, baking, poaching, steaming, or stir-frying. Talk to a dietitian to learn more about healthy cooking methods.  Medicines   Take medicines only as directed by your health care provider.   Do not take the following medicines unless your health care provider approves:    Nonsteroidal anti-inflammatory drugs (NSAIDs), such as ibuprofen, naproxen, or celecoxib.    Vitamin supplements that contain vitamin A, vitamin E, or both.    Hormone replacement therapy that contains estrogen with or without progestin.   Stop illegal drug use.  Activities   Follow an exercise program that is approved by your health care provider.   Plan rest periods when you are fatigued.  Lifestyle   Do not use any tobacco products, including cigarettes, chewing tobacco, or electronic cigarettes. If you need help quitting, ask your health care provider.   If you drink alcohol, and your health care provider approves, limit your alcohol intake to no more than 1 drink per day. One drink equals 12 ounces of beer, 5 ounces of wine, or 1 ounces of hard liquor.   Learn to manage   stress.   Maintain a healthy weight. Lose weight as approved by your health care provider.  General Instructions   Manage other health conditions, such as hypertension and diabetes, as directed by your health care provider.   Keep all follow-up visits as directed by your health care provider. This is important.   Your health care provider may ask you to monitor your blood pressure. A blood pressure reading consists of a higher number over a lower number, such as 110 over 72, written as 110/72. Ideally, your blood pressure should be:    Below 140/90 if you have no other medical conditions.    Below 130/80 if  you have diabetes or kidney disease.  SEEK IMMEDIATE MEDICAL CARE IF:   You have pain in your chest, neck, arm, jaw, stomach, or back that lasts more than a few minutes, is recurring, or is not relieved by taking medicine under your tongue (sublingual nitroglycerin).   You have profuse sweating without cause.   You have unexplained:    Heartburn or indigestion.    Shortness of breath or difficulty breathing.    Nausea or vomiting.    Fatigue.    Feelings of nervousness or anxiety.    Weakness.    Diarrhea.   You have sudden light-headedness or dizziness.   You faint.  These symptoms may represent a serious problem that is an emergency. Do not wait to see if the symptoms will go away. Get medical help right away. Call your local emergency services (911 in the [place] Do not drive yourself to the clinic or hospital.     This information is not intended to replace advice given to you by your health care provider. Make sure you discuss any questions you have with your health care provider.     Document Released: 08/27/2005 Document Revised: 09/17/2014 Document Reviewed: 12/29/2013  Elsevier Interactive Patient Education 2016 [employer]

## 2016-03-31 NOTE — Progress Notes (Signed)
ANTICOAGULATION CONSULT NOTE - Follow Up Consult  Pharmacy Consult for Heparin Indication: chest pain/ACS  Allergies  Allergen Reactions  . Ciprocin-Fluocin-Procin [Fluocinolone Acetonide] Rash  . Clarithromycin Itching and Other (See Comments)    "Biaxin" Eyes itch and burn  . Cleocin [Clindamycin Hcl] Anaphylaxis and Swelling  . Glimepiride [Amaryl] Other (See Comments)    Elevates liver function     Patient Measurements: Weight: 241 lb 1.6 oz (109.362 kg) Heparin Dosing Weight: 86.6  Vital Signs: Temp: 98 F (36.7 C) (07/22 0747) Temp Source: Oral (07/22 0747) BP: 112/69 mmHg (07/22 0747) Pulse Rate: 82 (07/22 0747)  Labs:  Recent Labs  03/30/16 0840 03/30/16 1541 03/30/16 1815 03/30/16 2037 03/31/16 0210 03/31/16 0218  HGB 10.9*  --   --   --   --  10.2*  HCT 33.4*  --   --   --   --  31.9*  PLT 217  --   --   --   --  224  HEPARINUNFRC  --   --  0.47  --  0.51  --   CREATININE 2.77*  --   --   --   --  2.66*  TROPONINI  --  <0.03  --  <0.03  --  <0.03    Estimated Creatinine Clearance: 35.4 mL/min (by C-G formula based on Cr of 2.66).   Assessment: 55 yo M presenting with chest pain, heparin started for r/o ACS. Heparin currently therapeutic. Hgb and plt stable. No s/sx bleeding reported.  Goal of Therapy:  Heparin level 0.3-0.7 units/ml Monitor platelets by anticoagulation protocol: Yes   Plan:  -Continue heparin 1400 units/hr. -Check daily CBC and heparin level. -Monitor S/Sx bleeding daily.  Arrie Senate, PharmD PGY-1 Pharmacy Resident Pager: [phone] 03/31/2016  I discussed / reviewed the pharmacy note by Dr. Biagio Quint and I agree with the resident's findings and plans as documented.  Uvaldo Rising, BCPS  Clinical Pharmacist Pager [phone]  03/31/2016 8:36 AM

## 2016-03-31 NOTE — Discharge Summary (Signed)
Discharge Summary    Patient ID: Matthew Ragucci.,  MRN: [id number], DOB/AGE: 56/05/1961 56 y.o.  Admit date: 03/30/2016 Discharge date: 03/31/2016  Primary Care Provider: Barrie Lyme Primary Cardiologist: Dr. Aundra Dubin  Discharge Diagnoses    Active Problems:   Chest pain   Allergies Allergies  Allergen Reactions  . Ciprocin-Fluocin-Procin [Fluocinolone Acetonide] Rash  . Clarithromycin Itching and Other (See Comments)    "Biaxin" Eyes itch and burn  . Cleocin [Clindamycin Hcl] Anaphylaxis and Swelling  . Glimepiride [Amaryl] Other (See Comments)    Elevates liver function     Diagnostic Studies/Procedures   _____________   History of Present Illness   Matthew Rowe is a 55 year old male patient of Dr. Aundra Dubin with past medical history of extensive CAD s/p RIMA--> RCA with Subsequent LAD and distal RCA stenting (3/15), hypertension, dyslipidemia, DM type II, GERD, CKD stage III, gout.   He is well-known to Dr. Aundra Dubin in reports having frequent angina episodes. At his last office visit the plan was to continue him on his antianginal medications, with the addition of Ranexa. Apparently had difficulty getting his Ranexa field after that appointment, and was admitted on 03/24/2016 with sharp substernal chest pain, along with reports of gout in his right big toe. He was admitted for observation and ruled out, chest pain resolved with the resumption of his home medications. It was felt at that time that symptoms are related to noncompliance with medications. There were no ischemic EKG changes noted and serial enzymes were negative. He was sent home with colchicine and his NSAIDs discontinued given his history of decreased renal function and cardiovascular disease. His last 2-D echo on 4/16 showed a normal EF of 50-55%, with no regional wall motion abnormality, and grade 1 diastolic dysfunction.  He reports feeling well post discharge, but attempted to fill his colchicine and  outpatient setting but was unable to related to price. He recently saw his PCP on Monday who added tramadol to help with his gout pain. Denies having any chest pain since discharge up until 03/30/16 around 5:30 AM when he was awoken with centralized chest pressure with radiation into the left arm. States that time he checked his blood sugar which was 147 and took his diabetic medication, also proceeded to check his blood pressure which was [id number] systolic. Reports he then took his morning medications including most of his blood pressure medications, and 3 nitroglycerin sprays. This brought his pain from a 10/10-6/10. States that his symptoms persisted which concerned him so he proceeded to come to the ED for further evaluation. Denies any dyspnea, dizziness, lightheadedness, palpitations, abdominal or lower extremity edema, nausea or vomiting.  Hospital Course  He was admitted on 03/30/16 for further evaluation. His troponin was negative 3. EKG showed no acute ST /T wave changes.   Given his chronic renal insufficiency his medical therapy was maximized for angina. His Imdur was increased to 90 mg daily. He will continue Ranexa 500 mg twice a day, as well as aspirin, Coreg, Plavix, and statin.His HCTZ was discontinued as he has ongoing gout pain. Will continue his furosemide.  He was chest pain free throughout admission. His blood pressure was well-controlled. His main issue is medication compliance and ability to obtain his medications.  His LDL last admission was 74 we will continue his statin. We will have him follow-up with Dr. Aundra Dubin in 2 weeks.   _____________  Discharge Vitals Blood pressure 143/84, pulse 82, temperature 98 F (36.7 C),  temperature source Oral, resp. rate 21, weight 241 lb 1.6 oz (109.362 kg), SpO2 99 %.  Filed Weights   03/31/16 0500  Weight: 241 lb 1.6 oz (109.362 kg)    Labs & Radiologic Studies     CBC  Recent Labs  03/30/16 0840 03/31/16 0218  WBC 7.7 7.1    NEUTROABS 5.0  --   HGB 10.9* 10.2*  HCT 33.4* 31.9*  MCV 87.0 87.4  PLT 217 [id number]   Basic Metabolic Panel  Recent Labs  03/30/16 0840 03/31/16 0218  NA 137 136  K 5.1 4.7  CL 111 109  CO2 18* 19*  GLUCOSE 211* 111*  BUN 28* 28*  CREATININE 2.77* 2.66*  CALCIUM 9.5 8.9   Liver Function Tests  Recent Labs  03/30/16 0840  AST 21  ALT 22  ALKPHOS 56  BILITOT 0.5  PROT 6.7  ALBUMIN 4.0   Cardiac Enzymes  Recent Labs  03/30/16 1541 03/30/16 2037 03/31/16 0218  TROPONINI <0.03 <0.03 <0.03    Dg Chest 2 View  03/30/2016  CLINICAL DATA:  Left chest pain EXAM: CHEST  2 VIEW COMPARISON:  03/24/2016 FINDINGS: Lungs are clear.  No pleural effusion or pneumothorax. Heart is top-normal in size. Mild degenerative changes of the visualized thoracolumbar spine. Median sternotomy. IMPRESSION: No evidence of acute cardiopulmonary disease. Electronically Signed   By: Julian Hy M.D.   On: 03/30/2016 09:19   Dg Chest 2 View  03/24/2016  CLINICAL DATA:  Chest pain EXAM: CHEST  2 VIEW COMPARISON:  07/17/2015. FINDINGS: The heart size and mediastinal contours are within normal limits. Both lungs are clear. The visualized skeletal structures are unremarkable. IMPRESSION: No active cardiopulmonary disease. Electronically Signed   By: Misty Stanley M.D.   On: 03/24/2016 14:18    Disposition   Pt is being discharged home today in good condition.  Follow-up Plans & Appointments    Follow-up Information    Follow up with Cecilie Kicks, NP On 04/11/2016.   Specialties:  Cardiology, Radiology   Why:  at 3pm for follow up   Contact information:   [place] [address] [phone]      Discharge Instructions    Diet - low sodium heart healthy    Complete by:  As directed      Increase activity slowly    Complete by:  As directed            Discharge Medications   Current Discharge Medication List    CONTINUE these medications which have  CHANGED   Details  isosorbide mononitrate (IMDUR) 30 MG 24 hr tablet Take 3 tablets (90 mg total) by mouth daily. Qty: 30 tablet, Refills: 12      CONTINUE these medications which have NOT CHANGED   Details  acetaminophen (TYLENOL) 500 MG tablet Take 1,000 mg by mouth every 8 (eight) hours as needed.    amLODipine (NORVASC) 10 MG tablet Take 1 tablet (10 mg total) by mouth daily. Qty: 90 tablet, Refills: 3    aspirin 81 MG tablet Take 1 tablet (81 mg total) by mouth daily.    carvedilol (COREG) 25 MG tablet TAKE TWO TABLETS BY MOUTH TWICE DAILY Qty: 120 tablet, Refills: 6    clopidogrel (PLAVIX) 75 MG tablet Take 1 tablet (75 mg total) by mouth daily. With breakfast. Qty: 90 tablet, Refills: 1    colchicine 0.6 MG tablet Take 1 tablet (0.6 mg total) by mouth 2 (two) times daily. Qty:  60 tablet, Refills: 1    fenofibrate (TRICOR) 145 MG tablet Take 1 tablet (145 mg total) by mouth daily. Qty: 30 tablet, Refills: 11    gabapentin (NEURONTIN) 300 MG capsule Take 1 capsule (300 mg total) by mouth 3 (three) times daily. PATIENT NEEDS OFFICE VISIT FOR ADDITIONAL REFILLS Qty: 270 capsule, Refills: 0    hydrALAZINE (APRESOLINE) 25 MG tablet Take 1 tablet (25 mg total) by mouth 3 (three) times daily. Qty: 270 tablet, Refills: 1    metFORMIN (GLUCOPHAGE) 500 MG tablet Take 250-500 mg by mouth 3 (three) times daily with meals. 500 mg at breakfast and dinner (or at bedtime), 250 mg at lunch    Multiple Vitamin (MULTIVITAMIN WITH MINERALS) TABS tablet Take 1 tablet by mouth daily.    nitroGLYCERIN (NITROLINGUAL) 0.4 MG/SPRAY spray Place 1 spray under the tongue every 5 (five) minutes x 3 doses as needed for chest pain. Qty: 12 g, Refills: 12    Omega-3 Fatty Acids (FISH OIL) 1200 MG CAPS Take 1 capsule (1,200 mg total) by mouth 2 (two) times daily.   Associated Diagnoses: Coronary atherosclerosis of autologous vein bypass graft without angina; Chest pain, unspecified chest pain type      pantoprazole (PROTONIX) 40 MG tablet Take 40 mg by mouth 2 (two) times daily.    pravastatin (PRAVACHOL) 80 MG tablet Take 1 tablet (80 mg total) by mouth every evening. Qty: 30 tablet, Refills: 11    ranolazine (RANEXA) 500 MG 12 hr tablet Take 1 tablet (500 mg total) by mouth 2 (two) times daily. Qty: 60 tablet, Refills: 11    traMADol (ULTRAM) 50 MG tablet Take 50 mg by mouth 3 (three) times daily.    vitamin E 400 UNIT capsule Take 400 Units by mouth daily.    zolpidem (AMBIEN) 10 MG tablet TAKE 1 TABLET AT BEDTIME Qty: 90 tablet, Refills: 0    diphenhydrAMINE (BENADRYL) 25 mg capsule Take 25 mg by mouth daily as needed for allergies.    furosemide (LASIX) 20 MG tablet Take 1 tablet (20 mg total) by mouth daily. Qty: 7 tablet, Refills: 0      STOP taking these medications     hydrochlorothiazide (HYDRODIURIL) 25 MG tablet             Outstanding Labs/Studies  BMP  Duration of Discharge Encounter   Greater than 30 minutes including physician time.  Signed, Arbutus Leas NP 03/31/2016, 11:41 AM

## 2016-04-03 DIAGNOSIS — I130 Hypertensive heart and chronic kidney disease with heart failure and stage 1 through stage 4 chronic kidney disease, or unspecified chronic kidney disease: Secondary | ICD-10-CM | POA: Diagnosis not present

## 2016-04-03 DIAGNOSIS — I200 Unstable angina: Principal | ICD-10-CM | POA: Insufficient documentation

## 2016-04-03 DIAGNOSIS — Z955 Presence of coronary angioplasty implant and graft: Secondary | ICD-10-CM | POA: Diagnosis not present

## 2016-04-03 DIAGNOSIS — E119 Type 2 diabetes mellitus without complications: Secondary | ICD-10-CM | POA: Insufficient documentation

## 2016-04-03 DIAGNOSIS — I252 Old myocardial infarction: Secondary | ICD-10-CM | POA: Diagnosis not present

## 2016-04-03 DIAGNOSIS — Z951 Presence of aortocoronary bypass graft: Secondary | ICD-10-CM | POA: Insufficient documentation

## 2016-04-03 DIAGNOSIS — N183 Chronic kidney disease, stage 3 (moderate): Secondary | ICD-10-CM | POA: Insufficient documentation

## 2016-04-03 DIAGNOSIS — I5032 Chronic diastolic (congestive) heart failure: Secondary | ICD-10-CM | POA: Diagnosis not present

## 2016-04-03 DIAGNOSIS — Z7984 Long term (current) use of oral hypoglycemic drugs: Secondary | ICD-10-CM | POA: Insufficient documentation

## 2016-04-03 DIAGNOSIS — Z7982 Long term (current) use of aspirin: Secondary | ICD-10-CM | POA: Diagnosis not present

## 2016-04-03 DIAGNOSIS — R079 Chest pain, unspecified: Secondary | ICD-10-CM | POA: Diagnosis present

## 2016-04-04 ENCOUNTER — Encounter (HOSPITAL_COMMUNITY): Payer: Self-pay | Admitting: Emergency Medicine

## 2016-04-04 ENCOUNTER — Observation Stay (HOSPITAL_COMMUNITY)
Admission: EM | Admit: 2016-04-04 | Discharge: 2016-04-04 | Disposition: A | Discharge status: Home / Self Care | Payer: Medicare Other | Attending: Internal Medicine | Admitting: Internal Medicine

## 2016-04-04 ENCOUNTER — Emergency Department (HOSPITAL_COMMUNITY): Payer: Medicare Other

## 2016-04-04 ENCOUNTER — Other Ambulatory Visit: Payer: Self-pay

## 2016-04-04 DIAGNOSIS — E785 Hyperlipidemia, unspecified: Secondary | ICD-10-CM

## 2016-04-04 DIAGNOSIS — I208 Other forms of angina pectoris: Secondary | ICD-10-CM | POA: Diagnosis present

## 2016-04-04 DIAGNOSIS — N189 Chronic kidney disease, unspecified: Secondary | ICD-10-CM

## 2016-04-04 DIAGNOSIS — I10 Essential (primary) hypertension: Secondary | ICD-10-CM

## 2016-04-04 DIAGNOSIS — I152 Hypertension secondary to endocrine disorders: Secondary | ICD-10-CM | POA: Diagnosis present

## 2016-04-04 DIAGNOSIS — E1169 Type 2 diabetes mellitus with other specified complication: Secondary | ICD-10-CM | POA: Diagnosis present

## 2016-04-04 DIAGNOSIS — I5032 Chronic diastolic (congestive) heart failure: Secondary | ICD-10-CM

## 2016-04-04 DIAGNOSIS — I200 Unstable angina: Secondary | ICD-10-CM

## 2016-04-04 LAB — CBC
HEMATOCRIT: 33.1 % — AB (ref 39.0–52.0)
HEMATOCRIT: 33.3 % — AB (ref 39.0–52.0)
Hemoglobin: 10.7 g/dL — ABNORMAL LOW (ref 13.0–17.0)
Hemoglobin: 10.8 g/dL — ABNORMAL LOW (ref 13.0–17.0)
MCH: 28.2 pg (ref 26.0–34.0)
MCH: 28.4 pg (ref 26.0–34.0)
MCHC: 32.3 g/dL (ref 30.0–36.0)
MCHC: 32.4 g/dL (ref 30.0–36.0)
MCV: 87.1 fL (ref 78.0–100.0)
MCV: 87.6 fL (ref 78.0–100.0)
PLATELETS: 222 10*3/uL (ref 150–400)
Platelets: 212 10*3/uL (ref 150–400)
RBC: 3.8 MIL/uL — ABNORMAL LOW (ref 4.22–5.81)
RBC: 3.8 MIL/uL — ABNORMAL LOW (ref 4.22–5.81)
RDW: 12.9 % (ref 11.5–15.5)
RDW: 12.9 % (ref 11.5–15.5)
WBC: 8.4 10*3/uL (ref 4.0–10.5)
WBC: 8.2 10*3/uL (ref 4.0–10.5)

## 2016-04-04 LAB — BASIC METABOLIC PANEL
Anion gap: 9 (ref 5–15)
BUN: 32 mg/dL — AB (ref 6–20)
CHLORIDE: 110 mmol/L (ref 101–111)
CO2: 17 mmol/L — AB (ref 22–32)
CREATININE: 2.64 mg/dL — AB (ref 0.61–1.24)
Calcium: 9.2 mg/dL (ref 8.9–10.3)
GFR calc Af Amer: 29 mL/min — ABNORMAL LOW (ref >60)
GFR calc non Af Amer: 25 mL/min — ABNORMAL LOW (ref >60)
GLUCOSE: 164 mg/dL — AB (ref 65–99)
POTASSIUM: 4.5 mmol/L (ref 3.5–5.1)
Sodium: 136 mmol/L (ref 135–145)

## 2016-04-04 LAB — GLUCOSE, CAPILLARY
Glucose-Capillary: 130 mg/dL — ABNORMAL HIGH (ref 65–99)
Glucose-Capillary: 238 mg/dL — ABNORMAL HIGH (ref 65–99)

## 2016-04-04 LAB — TSH: TSH: 3.223 u[IU]/mL (ref 0.350–4.500)

## 2016-04-04 LAB — I-STAT TROPONIN, ED: Troponin i, poc: 0.01 ng/mL (ref 0.00–0.08)

## 2016-04-04 LAB — BRAIN NATRIURETIC PEPTIDE: B NATRIURETIC PEPTIDE 5: 229.3 pg/mL — AB (ref 0.0–100.0)

## 2016-04-04 LAB — MAGNESIUM: MAGNESIUM: 1.6 mg/dL — AB (ref 1.7–2.4)

## 2016-04-04 LAB — MRSA PCR SCREENING: MRSA by PCR: NEGATIVE

## 2016-04-04 LAB — TROPONIN I
Troponin I: <0.03 ng/mL (ref <0.03)
Troponin I: <0.03 ng/mL (ref <0.03)

## 2016-04-04 LAB — CREATININE, SERUM
Creatinine, Ser: 2.52 mg/dL — ABNORMAL HIGH (ref 0.61–1.24)
GFR calc Af Amer: 31 mL/min — ABNORMAL LOW (ref >60)
GFR, EST NON AFRICAN AMERICAN: 27 mL/min — AB (ref >60)

## 2016-04-04 MED ORDER — ASPIRIN EC 81 MG PO TBEC
81.0000 mg | DELAYED_RELEASE_TABLET | Freq: Every day | ORAL | Status: DC
  Administered 2016-04-04: 81 mg via ORAL
  Filled 2016-04-04: qty 1

## 2016-04-04 MED ORDER — FENOFIBRATE 160 MG PO TABS: 160.0000 mg | ORAL_TABLET | Freq: Every day | ORAL | Status: DC

## 2016-04-04 MED ORDER — ACETAMINOPHEN 500 MG PO TABS: 1000.0000 mg | ORAL_TABLET | Freq: Three times a day (TID) | ORAL | Status: DC | PRN

## 2016-04-04 MED ORDER — CARVEDILOL 25 MG PO TABS
50.0000 mg | ORAL_TABLET | Freq: Two times a day (BID) | ORAL | Status: DC
  Administered 2016-04-04: 50 mg via ORAL
  Filled 2016-04-04: qty 2

## 2016-04-04 MED ORDER — AMLODIPINE BESYLATE 10 MG PO TABS
10.0000 mg | ORAL_TABLET | Freq: Every day | ORAL | Status: DC
  Administered 2016-04-04: 10 mg via ORAL
  Filled 2016-04-04: qty 1

## 2016-04-04 MED ORDER — ONDANSETRON HCL 4 MG/2ML IJ SOLN: 4.0000 mg | Freq: Four times a day (QID) | INTRAMUSCULAR | Status: DC | PRN

## 2016-04-04 MED ORDER — ENOXAPARIN SODIUM 30 MG/0.3ML ~~LOC~~ SOLN
30.0000 mg | SUBCUTANEOUS | Status: DC
Start: 2016-04-04 — End: 2016-04-04
  Administered 2016-04-04: 30 mg via SUBCUTANEOUS
  Filled 2016-04-04: qty 0.3

## 2016-04-04 MED ORDER — NITROGLYCERIN 0.4 MG SL SUBL
0.4000 mg | SUBLINGUAL_TABLET | SUBLINGUAL | Status: DC | PRN
  Administered 2016-04-04 (×2): 0.4 mg via SUBLINGUAL
  Filled 2016-04-04 (×3): qty 1

## 2016-04-04 MED ORDER — ISOSORBIDE MONONITRATE ER 60 MG PO TB24
120.0000 mg | ORAL_TABLET | Freq: Every day | ORAL | Status: DC
  Administered 2016-04-04: 120 mg via ORAL
  Filled 2016-04-04: qty 2

## 2016-04-04 MED ORDER — ACETAMINOPHEN 325 MG PO TABS: 650.0000 mg | ORAL_TABLET | ORAL | Status: DC | PRN

## 2016-04-04 MED ORDER — PRAVASTATIN SODIUM 40 MG PO TABS: 80.0000 mg | ORAL_TABLET | Freq: Every evening | ORAL | Status: DC

## 2016-04-04 MED ORDER — NITROGLYCERIN 0.4 MG SL SUBL: 0.4000 mg | SUBLINGUAL_TABLET | SUBLINGUAL | Status: DC | PRN

## 2016-04-04 MED ORDER — PANTOPRAZOLE SODIUM 40 MG PO TBEC
40.0000 mg | DELAYED_RELEASE_TABLET | Freq: Two times a day (BID) | ORAL | Status: DC
  Administered 2016-04-04: 40 mg via ORAL
  Filled 2016-04-04: qty 1

## 2016-04-04 MED ORDER — RANOLAZINE ER 500 MG PO TB12
500.0000 mg | ORAL_TABLET | Freq: Two times a day (BID) | ORAL | Status: DC
  Administered 2016-04-04: 500 mg via ORAL
  Filled 2016-04-04: qty 1

## 2016-04-04 MED ORDER — CLOPIDOGREL BISULFATE 75 MG PO TABS
75.0000 mg | ORAL_TABLET | Freq: Every day | ORAL | Status: DC
  Administered 2016-04-04: 75 mg via ORAL
  Filled 2016-04-04: qty 1

## 2016-04-04 MED ORDER — ISOSORBIDE MONONITRATE ER 30 MG PO TB24: 120.0000 mg | ORAL_TABLET | Freq: Every day | ORAL | 0 refills | Status: DC

## 2016-04-04 MED ORDER — MAGNESIUM SULFATE IN D5W 1-5 GM/100ML-% IV SOLN
1.0000 g | Freq: Once | INTRAVENOUS | Status: AC
  Administered 2016-04-04: 1 g via INTRAVENOUS
  Filled 2016-04-04: qty 100

## 2016-04-04 MED ORDER — ASPIRIN 81 MG PO CHEW
324.0000 mg | CHEWABLE_TABLET | Freq: Once | ORAL | Status: AC
  Administered 2016-04-04: 324 mg via ORAL
  Filled 2016-04-04: qty 4

## 2016-04-04 MED ORDER — HYDRALAZINE HCL 50 MG PO TABS
50.0000 mg | ORAL_TABLET | Freq: Three times a day (TID) | ORAL | Status: DC
  Administered 2016-04-04: 50 mg via ORAL
  Filled 2016-04-04: qty 1

## 2016-04-04 NOTE — Discharge Summary (Signed)
Discharge Summary    Patient ID: Matthew Homeyer.,  MRN: [id number], DOB/AGE: 56-Dec-1962 56 y.o.  Admit date: 04/04/2016 Discharge date: 04/04/2016  Primary Care Provider: Barrie Lyme Primary Cardiologist: Dr. Aundra Dubin  Discharge Diagnoses    Principal Problem:   Angina at rest [facility]) Active Problems:   Dyslipidemia   Essential hypertension   History of Present Illness    Matthew Fugua. is a 55 y.o. male with past medical history of extensive CAD (s/p RIMA-RCA with Subsequent LAD and distal RCA stenting (3/15), hypertension, dyslipidemia, DM type II, GERD, CKD stage III, and gout who presented to [facility] ED on 04/04/2016 for evaluation of chest pressure.  Reported having chest pressure along his sternal region which was non-radiating and occurring at rest or with exertion. Said his SBP had been elevated to 150 - 170 throughout the day, which was unusual for him. He also reported taking OTC NSAIDS for joint pain in addition to his Colchicine.  While in the ED, his initial i-STAT troponin was negative and EKG showed no acute ischemic changes. His Hydralazine was increased from 25mg  TID to 50mg  TID and his Imdur was increased from 90mg  daily to 120mg  daily. He was admitted for ACS rule-out and further management of his BP.   Hospital Course     Consultants: None  The following morning, he denied any repeat episodes of chest discomfort. His remaining two troponin values were negative. Mg was low at 1.6 and this was replaced. His SBP was improved to 130 at time of discharge. He was educated extensively about avoiding NSAIDS, especially with his Stage 3 CKD.   He was last examined by Dr. Tamala Julian and deemed stable for discharge. His Imdur will remain at the 120mg  daily as compared to the PTA dosing of 90mg . He prefers to check his BP at home prior to having multiple medication changes. He was asked to check his BP at home twice daily upon discharge and record these numbers  for review at his follow-up appointment. With the cessation of NSAIDS, hopefully his BP will be better controlled. If not, we will need to increase his Hydralazine from 25mg  TID to 50mg  TID. He has scheduled hospital follow-up on 04/11/2016.  _____________  Discharge Vitals Blood pressure 136/78, pulse 86, temperature 97.7 F (36.5 C), temperature source Oral, resp. rate (!) 23, height 5\' 5"  (1.651 m), weight 240 lb 3.2 oz (109 kg), SpO2 98 %.  Filed Weights   04/04/16 0006 04/04/16 0743  Weight: 243 lb (110.2 kg) 240 lb 3.2 oz (109 kg)    Labs & Radiologic Studies     CBC  Recent Labs  04/04/16 0102 04/04/16 0806  WBC 8.4 8.2  HGB 10.7* 10.8*  HCT 33.1* 33.3*  MCV 87.1 87.6  PLT 222 [id number]   Basic Metabolic Panel  Recent Labs  04/04/16 0102 04/04/16 0806  NA 136  --   K 4.5  --   CL 110  --   CO2 17*  --   GLUCOSE 164*  --   BUN 32*  --   CREATININE 2.64* 2.52*  CALCIUM 9.2  --   MG  --  1.6*   Liver Function Tests No results for input(s): AST, ALT, ALKPHOS, BILITOT, PROT, ALBUMIN in the last 72 hours. No results for input(s): LIPASE, AMYLASE in the last 72 hours. Cardiac Enzymes  Recent Labs  04/04/16 0806 04/04/16 1332  TROPONINI <0.03 <0.03   BNP Invalid input(s): POCBNP  D-Dimer No results for input(s): DDIMER in the last 72 hours. Hemoglobin A1C No results for input(s): HGBA1C in the last 72 hours. Fasting Lipid Panel No results for input(s): CHOL, HDL, LDLCALC, TRIG, CHOLHDL, LDLDIRECT in the last 72 hours. Thyroid Function Tests  Recent Labs  04/04/16 0806  TSH 3.223    Dg Chest 2 View: Result Date: 04/04/2016 CLINICAL DATA:  55 year old male with chest pain and tightness. Chest radiograph dated 03/30/2016 EXAM: CHEST  2 VIEW COMPARISON:  Chest radiograph dated 03/30/2016 FINDINGS: The lungs are clear. There is no pleural effusion or pneumothorax. Top-normal cardiac size. Median sternotomy wires. No acute osseous pathology. IMPRESSION: No active  cardiopulmonary disease. Electronically Signed   By: Anner Crete M.D.   On: 04/04/2016 00:58  Diagnostic Studies/Procedures    None Performed.  Disposition   Pt is being discharged home today in good condition.  Follow-up Plans & Appointments    Follow-up Information    Cecilie Kicks, NP Follow up on 04/11/2016.   Specialties:  Cardiology, Radiology Why:  Cardiology Follow-Up on 04/11/2016 at 3:00PM. Contact information: [place] [address] [phone]          Discharge Instructions    Diet - low sodium heart healthy    Complete by:  As directed      Discharge Medications     Medication List    TAKE these medications   acetaminophen 500 MG tablet Commonly known as:  TYLENOL Take 1,000 mg by mouth every 8 (eight) hours as needed for mild pain.   amLODipine 10 MG tablet Commonly known as:  NORVASC Take 1 tablet (10 mg total) by mouth daily.   aspirin 81 MG tablet Take 1 tablet (81 mg total) by mouth daily.   carvedilol 25 MG tablet Commonly known as:  COREG TAKE TWO TABLETS BY MOUTH TWICE DAILY   clopidogrel 75 MG tablet Commonly known as:  PLAVIX Take 1 tablet (75 mg total) by mouth daily. With breakfast.   colchicine 0.6 MG tablet Take 1 tablet (0.6 mg total) by mouth 2 (two) times daily.   diphenhydrAMINE 25 mg capsule Commonly known as:  BENADRYL Take 25 mg by mouth daily as needed for allergies.   fenofibrate 145 MG tablet Commonly known as:  TRICOR Take 1 tablet (145 mg total) by mouth daily.   Fish Oil 1200 MG Caps Take 1 capsule (1,200 mg total) by mouth 2 (two) times daily.   gabapentin 300 MG capsule Commonly known as:  NEURONTIN Take 1 capsule (300 mg total) by mouth 3 (three) times daily. PATIENT NEEDS OFFICE VISIT FOR ADDITIONAL REFILLS What changed:  how much to take  when to take this  additional instructions   hydrALAZINE 25 MG tablet Commonly known as:  APRESOLINE Take 1 tablet (25 mg total)  by mouth 3 (three) times daily.   isosorbide mononitrate 30 MG 24 hr tablet Commonly known as:  IMDUR Take 4 tablets (120 mg total) by mouth daily. What changed:  how much to take   metFORMIN 500 MG tablet Commonly known as:  GLUCOPHAGE Take 250-500 mg by mouth 3 (three) times daily with meals. 500 mg at breakfast and dinner (or at bedtime), 250 mg at lunch   multivitamin with minerals Tabs tablet Take 1 tablet by mouth daily.   nitroGLYCERIN 0.4 MG/SPRAY spray Commonly known as:  NITROLINGUAL Place 1 spray under the tongue every 5 (five) minutes x 3 doses as needed for chest pain.  pantoprazole 40 MG tablet Commonly known as:  PROTONIX Take 40 mg by mouth 2 (two) times daily.   pravastatin 80 MG tablet Commonly known as:  PRAVACHOL Take 1 tablet (80 mg total) by mouth every evening.   ranolazine 500 MG 12 hr tablet Commonly known as:  RANEXA Take 1 tablet (500 mg total) by mouth 2 (two) times daily.   traMADol 50 MG tablet Commonly known as:  ULTRAM Take 50 mg by mouth 3 (three) times daily.   vitamin E 400 UNIT capsule Take 400 Units by mouth daily.   zolpidem 10 MG tablet Commonly known as:  AMBIEN TAKE 1 TABLET AT BEDTIME What changed:  See the new instructions.        Allergies Allergies  Allergen Reactions  . Ciprocin-Fluocin-Procin [Fluocinolone Acetonide] Rash  . Clarithromycin Itching and Other (See Comments)    "Biaxin" Eyes itch and burn  . Cleocin [Clindamycin Hcl] Anaphylaxis and Swelling  . Glimepiride [Amaryl] Other (See Comments)    Elevates liver function      Outstanding Labs/Studies   None  Duration of Discharge Encounter   Greater than 30 minutes including physician time.  Signed, Erma Heritage, PA-C 04/04/2016, 3:02 PM The patient has been seen in conjunction with [place], PA-C. All aspects of care have been considered and discussed. The patient has been personally interviewed, examined, and all clinical data has  been reviewed.   Patient was able to ambulate without difficulty. Blood pressure remained reasonably controlled. The patient felt well and requested discharge. There were no significant clinical problems that would preclude discharge.

## 2016-04-04 NOTE — Care Management Obs Status (Signed)
[facility] NOTIFICATION   Patient Details  Name: Matthew SOLOMON Sr. MRN: [id number] Date of Birth: [birth year]   Medicare Observation Status Notification Given:  Yes    Bethena Roys, RN 04/04/2016, 11:50 AM

## 2016-04-04 NOTE — Discharge Instructions (Signed)
PLEASE CHECK YOUR BLOOD PRESSURE TWICE DAILY AT HOME UNTIL YOUR FOLLOW-UP APPOINTMENT SO MEDICATIONS CAN BE ADJUSTED AT THAT TIME.

## 2016-04-04 NOTE — ED Notes (Signed)
Aldona Bar, RN 3W aware of delay.  Shanon Brow, RN (ED charge) aware of the delay.

## 2016-04-04 NOTE — ED Notes (Signed)
Pt and wife stated that they were concerned that the patient's chest pain is related to his new blood pressure medication.

## 2016-04-04 NOTE — H&P (Signed)
Cardiologist: Dr. Aundra Dubin -- last office visit 01/24/2016  CC: CP  HPI: 55 yo AA man with significant history of CAD (detailed in [place] below), CABG (RIMA to RCA, anomalous RCA off left cusp), DM, HTN, DLD, CKD/III.IV, gout, recurrent angina episodes, recent short admission for CP frmo 03/24/16 to 03/25/16, presents again today to ER for CP evaluation. He reports that all days he was having mild to moderate chest pressure, off and on, associated with elevated BP (systolic [id number]). Denies fever, cough, N/V, orthopnea, PND, edema, palpitations, diaphoresis. Reports compliance with prescribed medications. However, also admits of taking OTC NSAIDS for joints pain attributed to gout. Chest pressure is typically in the midsternal region, nonradiating and sometimes associated with rest, improves with NTG and OTC topical pain medication.     Echo 12/2014:  Study Conclusions - Left ventricle: The cavity size was normal. Wall thickness was   increased in a pattern of mild LVH. Systolic function was normal.   The estimated ejection fraction was in the range of 50% to 55%.   Wall motion was normal; there were no regional wall motion   abnormalities. Doppler parameters are consistent with abnormal   left ventricular relaxation (grade 1 diastolic dysfunction). - Aortic valve: There was very mild stenosis. There was mild   regurgitation. Valve area (VTI): 1.7 cm^2. Valve area (Vmax):   1.61 cm^2. Valve area (Vmean): 1.54 cm^2. - Mitral valve: Calcified annulus. Mildly thickened leaflets .   There was mild regurgitation.   Lexiscan 11/2013: IMPRESSION: -Non reversible decreased myocardial perfusion at the left ventricular apex. -Large perfusion defect involving the inferolateral wall the left ventricle with partial reversibility particularly at the lateral margin of the defect on resting images. -When compared to the previous exam the inferior wall defect extends further into the posterior wall and  lateral wall than on the previous study. -Normal left ventricular ejection fraction of 54%, slightly increased from the 46% on the previous exam.   Review of Systems:  10 systems reviewed unremarkable except as noted in HPI   Past Medical History:  Diagnosis Date  . Anginal pain ([place])   . CKD (chronic kidney disease), stage III   . Coronary artery disease    a. INF MI 1999: PCI of RCA;  b.  extensive stenting of LAD;  c. s/p CABG with RIMA->RCA in 2006;  d. Stable, Low risk MV 05/2012; e. 09/2012 Cath: stable anatomy->med Rx. f. Abnl nuc 11/2013 -> med rx; g. 11/2013 Cath/PCI: LM 30d, LAD 50ost, 20/50isr, 100d, LCX 40-50ost, OM2 50ost, RI 70-80ost sup branch, RCA 20p, 90/60d(2.25x17 & 2.25x24 Promus DES'), PDA 60p, RIMA->RCA ok.  . DM2 (diabetes mellitus, type 2) ([place])   . Dyslipidemia   . GERD (gastroesophageal reflux disease)   . History of gout   . Hx of echocardiogram    a. Echo 3/12: mild LVH, EF 55-60%, trivial AI  . Hypertensive heart disease   . Myocardial infarction [facility]) 1999; 2002; 2003; 2006; ~ 2008  . OSA on CPAP   . Polycystic kidney disease     No current facility-administered medications on file prior to encounter.    Current Outpatient Prescriptions on File Prior to Encounter  Medication Sig Dispense Refill  . acetaminophen (TYLENOL) 500 MG tablet Take 1,000 mg by mouth every 8 (eight) hours as needed for mild pain.     Marland Kitchen amLODipine (NORVASC) 10 MG tablet Take 1 tablet (10 mg total) by mouth daily. 90 tablet 3  . aspirin 81  MG tablet Take 1 tablet (81 mg total) by mouth daily.    . carvedilol (COREG) 25 MG tablet TAKE TWO TABLETS BY MOUTH TWICE DAILY 120 tablet 6  . clopidogrel (PLAVIX) 75 MG tablet Take 1 tablet (75 mg total) by mouth daily. With breakfast. 90 tablet 1  . colchicine 0.6 MG tablet Take 1 tablet (0.6 mg total) by mouth 2 (two) times daily. 60 tablet 1  . diphenhydrAMINE (BENADRYL) 25 mg capsule Take 25 mg by mouth daily as needed for allergies.    .  fenofibrate (TRICOR) 145 MG tablet Take 1 tablet (145 mg total) by mouth daily. 30 tablet 11  . gabapentin (NEURONTIN) 300 MG capsule Take 1 capsule (300 mg total) by mouth 3 (three) times daily. PATIENT NEEDS OFFICE VISIT FOR ADDITIONAL REFILLS (Patient taking differently: Take 300-600 mg by mouth 2 (two) times daily. Take 600 mg every morning  Take 300 mg every evening.) 270 capsule 0  . hydrALAZINE (APRESOLINE) 25 MG tablet Take 1 tablet (25 mg total) by mouth 3 (three) times daily. 270 tablet 1  . isosorbide mononitrate (IMDUR) 30 MG 24 hr tablet Take 3 tablets (90 mg total) by mouth daily. 30 tablet 12  . metFORMIN (GLUCOPHAGE) 500 MG tablet Take 250-500 mg by mouth 3 (three) times daily with meals. 500 mg at breakfast and dinner (or at bedtime), 250 mg at lunch    . Multiple Vitamin (MULTIVITAMIN WITH MINERALS) TABS tablet Take 1 tablet by mouth daily.    . nitroGLYCERIN (NITROLINGUAL) 0.4 MG/SPRAY spray Place 1 spray under the tongue every 5 (five) minutes x 3 doses as needed for chest pain. 12 g 12  . Omega-3 Fatty Acids (FISH OIL) 1200 MG CAPS Take 1 capsule (1,200 mg total) by mouth 2 (two) times daily.    . pantoprazole (PROTONIX) 40 MG tablet Take 40 mg by mouth 2 (two) times daily.    . pravastatin (PRAVACHOL) 80 MG tablet Take 1 tablet (80 mg total) by mouth every evening. 30 tablet 11  . ranolazine (RANEXA) 500 MG 12 hr tablet Take 1 tablet (500 mg total) by mouth 2 (two) times daily. 60 tablet 11  . traMADol (ULTRAM) 50 MG tablet Take 50 mg by mouth 3 (three) times daily.    . vitamin E 400 UNIT capsule Take 400 Units by mouth daily.    Marland Kitchen zolpidem (AMBIEN) 10 MG tablet TAKE 1 TABLET AT BEDTIME (Patient taking differently: TAKE 1 TABLET AT BEDTIME AS NEEDED FOR SLEEP) 90 tablet 0      Allergies  Allergen Reactions  . Ciprocin-Fluocin-Procin [Fluocinolone Acetonide] Rash  . Clarithromycin Itching and Other (See Comments)    "Biaxin" Eyes itch and burn  . Cleocin [Clindamycin  Hcl] Anaphylaxis and Swelling  . Glimepiride [Amaryl] Other (See Comments)    Elevates liver function     Social History   Social History  . Marital status: Married    Spouse name: [name]  . Number of children: 5  . Years of education: N/A   Occupational History  . retired     Chartered loss adjuster   Social History Main Topics  . Smoking status: Never Smoker  . Smokeless tobacco: Never Used     Comment: smoked some as a teenager (high school)  . Alcohol use No     Comment: 09/15/2012 "drank a little when I was young"  . Drug use: No  . Sexual activity: Yes   Other Topics Concern  . Not on file  Social History Narrative   Married and lives with wife in [place]. On disability.    Consumes about 2 Coke Zeros a day     Family History  Problem Relation Age of Onset  . Lung cancer Mother   . Anemia Mother   . Polycystic kidney disease Mother   . Diabetes Father   . Heart attack Father 38    Died suddenly  . Heart failure Father   . Hyperlipidemia Father   . Hypertension Father   . Kidney failure Sister 5    Died  . Heart attack Sister   . Hypertension Sister   . Diabetes Sister   . Diabetes Brother   . Polycystic kidney disease Brother   . Diabetes Sister     PHYSICAL EXAM: Vitals:   04/04/16 0415 04/04/16 0445  BP: 142/85 143/88  Pulse: 84 86  Resp: 26 (!) 31  Temp:     General:  Well appearing. No respiratory difficulty HEENT: normal Neck: supple. no JVD. Carotids 2+ bilat; no bruits. No lymphadenopathy or thryomegaly appreciated. Cor: PMI nondisplaced. Regular rate & rhythm. No rubs, gallops or murmurs. Lungs: clear Abdomen: soft, nontender, nondistended. No hepatosplenomegaly. No bruits or masses. Good bowel sounds. Extremities: no cyanosis, clubbing, rash, edema Neuro: alert & oriented x 3, cranial nerves grossly intact. moves all 4 extremities w/o difficulty. Affect pleasant.    Results for orders placed or performed during the hospital encounter of  04/04/16 (from the past 24 hour(s))  Basic metabolic panel     Status: Abnormal   Collection Time: 04/04/16  1:02 AM  Result Value Ref Range   Sodium 136 135 - 145 mmol/L   Potassium 4.5 3.5 - 5.1 mmol/L   Chloride 110 101 - 111 mmol/L   CO2 17 (L) 22 - 32 mmol/L   Glucose, Bld 164 (H) 65 - 99 mg/dL   BUN 32 (H) 6 - 20 mg/dL   Creatinine, Ser 2.64 (H) 0.61 - 1.24 mg/dL   Calcium 9.2 8.9 - 10.3 mg/dL   GFR calc non Af Amer 25 (L) >60 mL/min   GFR calc Af Amer 29 (L) >60 mL/min   Anion gap 9 5 - 15  CBC     Status: Abnormal   Collection Time: 04/04/16  1:02 AM  Result Value Ref Range   WBC 8.4 4.0 - 10.5 K/uL   RBC 3.80 (L) 4.22 - 5.81 MIL/uL   Hemoglobin 10.7 (L) 13.0 - 17.0 g/dL   HCT 33.1 (L) 39.0 - 52.0 %   MCV 87.1 78.0 - 100.0 fL   MCH 28.2 26.0 - 34.0 pg   MCHC 32.3 30.0 - 36.0 g/dL   RDW 12.9 11.5 - 15.5 %   Platelets 222 150 - 400 K/uL  I-stat troponin, ED     Status: None   Collection Time: 04/04/16  1:02 AM  Result Value Ref Range   Troponin i, poc 0.01 0.00 - 0.08 ng/mL   Comment 3          Brain natriuretic peptide     Status: Abnormal   Collection Time: 04/04/16  1:02 AM  Result Value Ref Range   B Natriuretic Peptide 229.3 (H) 0.0 - 100.0 pg/mL   Dg Chest 2 View  Result Date: 04/04/2016 CLINICAL DATA:  55 year old male with chest pain and tightness. Chest radiograph dated 03/30/2016 EXAM: CHEST  2 VIEW COMPARISON:  Chest radiograph dated 03/30/2016 FINDINGS: The lungs are clear. There is no pleural effusion or pneumothorax. Top-normal  cardiac size. Median sternotomy wires. No acute osseous pathology. IMPRESSION: No active cardiopulmonary disease. Electronically Signed   By: Anner Crete M.D.   On: 04/04/2016 00:58    ASSESSMENT:  1. Angina pectoris, unstable - now resolved  - known CAD, PCI, CABG, repeat PCI - No evidence of acute HF or shock at this time   2. DM - had poor control previously with elevated HbA1c last year   3. DLD  4. Gout - was  using OTC NSAIDS  5. CKD-IV   PLAN/DISCUSSION:  Admit to cardiology for OBS and up titration of antianginal therapy (increase Imdur from 90 to 120 mg po qd) Continue Coreg 50 mg po bid, Ranexa 500 mg po bid  Increase Hydralazine from 25 tid to 50 tid  Serial Trop Strongly advised to stop all OTC NSAIDS (he was taking those for arthralgia/gout) Will reserve angiography only if absolutely necessary in future given poor renal fx  Please see orders for details   Wandra Mannan, MD Cardiology

## 2016-04-04 NOTE — ED Notes (Signed)
Cardiology re-paged by Network engineer.

## 2016-04-04 NOTE — Progress Notes (Addendum)
   Put in as observation today after being found with severe blood pressure elevation possibly related to nonsteroidal anti-inflammatory use.  In this setting he had chest pressure but no clinical objective evidence of ischemia.  Medication adjustment is been made. He has been counseled against nonsteroidal anti-inflammatory therapy. If markers remain negative he will be eligible for discharge this evening or in a.m.

## 2016-04-04 NOTE — ED Notes (Signed)
Pt continues to have chest pain. Cardiology paged.

## 2016-04-04 NOTE — ED Provider Notes (Signed)
[facility] DEPT Provider Note   CSN: [id number] Arrival date & time: 04/03/16  2358  First Provider Contact:  None       History   Chief Complaint Chief Complaint  Patient presents with  . Chest Pain    HPI Matthew Rowe. is a 55 y.o. male.  Matthew GADWAY Sr. is a 55 y.o. male history of CAD with CABG, cardiac stent, diabetes, hypertension here presenting with an acute onset of chest pain onset around 6pm tonight. He reports it is associated with some shortness of breath and radiated to the left arm.  Pt reports taking his night medications including those for HTN which helped some.  He reports his SBP at 6pm was 158.  Patient states that he was admitted recently to cardiology service and due to his renal failure, decided not to cath him and just to rule him out. Denies any abdominal pain or fevers or vomiting.     Chest Pain   Associated symptoms include diaphoresis ( mild, resolved PTA) and shortness of breath. Pertinent negatives include no abdominal pain, no fever, no nausea and no vomiting.   Record Review:   - Patient was admitted on 03/24/2016 with sharp substernal chest pain.  He had an observation and was ruled out. At that time chest pain resolved with resumption of his home medications. There are no ischemic EKG changes and serial enzymes were negative.    - His last 2-D echo on 4/16 showed a normal EF of 50-55%, with no regional wall motion abnormality, and grade 1 diastolic dysfunction.  - Patient was readmitted on 03/30/2016 for unstable angina.  He had a 24 observation and given his chronic renal insufficiency medical therapy was maximized.  His troponin was negative 3. EKG showed no acute ST /T wave changes.  His Imdur was increased to 90 mg daily. He will continue Ranexa 500 mg twice a day, as well as aspirin, Coreg, Plavix, and statin. His HCTZ was discontinued as he has ongoing gout pain but they did continue his furosemide.   Past Medical History:    Diagnosis Date  . Anginal pain ([place])   . CKD (chronic kidney disease), stage III   . Coronary artery disease    a. INF MI 1999: PCI of RCA;  b.  extensive stenting of LAD;  c. s/p CABG with RIMA->RCA in 2006;  d. Stable, Low risk MV 05/2012; e. 09/2012 Cath: stable anatomy->med Rx. f. Abnl nuc 11/2013 -> med rx; g. 11/2013 Cath/PCI: LM 30d, LAD 50ost, 20/50isr, 100d, LCX 40-50ost, OM2 50ost, RI 70-80ost sup branch, RCA 20p, 90/60d(2.25x17 & 2.25x24 Promus DES'), PDA 60p, RIMA->RCA ok.  . DM2 (diabetes mellitus, type 2) ([place])   . Dyslipidemia   . GERD (gastroesophageal reflux disease)   . History of gout   . Hx of echocardiogram    a. Echo 3/12: mild LVH, EF 55-60%, trivial AI  . Hypertensive heart disease   . Myocardial infarction [facility]) 1999; 2002; 2003; 2006; ~ 2008  . OSA on CPAP   . Polycystic kidney disease     Patient Active Problem List   Diagnosis Date Noted  . CKD (chronic kidney disease)   . Angina at rest [facility]) 03/24/2016  . GERD (gastroesophageal reflux disease) 05/08/2015  . Gout 05/08/2015  . Chronic diastolic CHF (congestive heart failure) ([place]) 05/08/2015  . Pain in the chest   . CAD- CABG 2006, RCA DES March 2015   . Hypertensive heart disease   .  Unstable angina ([place]) 12/01/2013  . Chest pain 11/27/2013  . Financial difficulty 03/13/2013  . Medically noncompliant 03/13/2013  . Type 2 diabetes mellitus with renal manifestations ([place])   . Obesity-BMI 42 11/27/2012  . Acute renal failure superimposed on stage 3 chronic kidney disease ([place]) 09/15/2012  . Polycystic kidney disease 09/15/2012  . OSA on CPAP 09/15/2012  . HTN (hypertension) 03/27/2011  . Dyslipidemia     Past Surgical History:  Procedure Laterality Date  . CARDIAC CATHETERIZATION  2012  . CARDIAC CATHETERIZATION  2013  . CARDIAC CATHETERIZATION  2014   LHC (1/14): total occlusion of distal LAD, diffuse disease in ramus, 70% proximal LCx, 50% proximal and mid RCA, 50% distal RCA, patent RIMA-RCA.   Marland Kitchen CORONARY ANGIOPLASTY WITH STENT PLACEMENT  1999 / 2002 / 2004 / 2006?  . CORONARY ARTERY BYPASS GRAFT  2006   CABG X?; in [place]  . CYSTECTOMY  1990's   off face  . LEFT HEART CATHETERIZATION WITH CORONARY/GRAFT ANGIOGRAM N/A 09/16/2012   Procedure: LEFT HEART CATHETERIZATION WITH [name];  Surgeon: [name], MD;  Location: [facility] CATH LAB;  Service: Cardiovascular;  Laterality: N/A;  . LEFT HEART CATHETERIZATION WITH CORONARY/GRAFT ANGIOGRAM N/A 12/02/2013   Procedure: LEFT HEART CATHETERIZATION WITH [name];  Surgeon: [name], MD;  Location: [facility] CATH LAB;  Service: Cardiovascular;  Laterality: N/A;  . PERCUTANEOUS CORONARY STENT INTERVENTION (PCI-S) N/A 12/03/2013   Procedure: PERCUTANEOUS CORONARY STENT INTERVENTION (PCI-S);  Surgeon: [name], MD;  Location: [facility] CATH LAB;  Service: Cardiovascular;  Laterality: N/A;       Home Medications    Prior to Admission medications   Medication Sig Start Date End Date Taking? Authorizing Provider  acetaminophen (TYLENOL) 500 MG tablet Take 1,000 mg by mouth every 8 (eight) hours as needed for mild pain.    Yes Historical Provider, MD  amLODipine (NORVASC) 10 MG tablet Take 1 tablet (10 mg total) by mouth daily. 01/12/15  Yes Gay Filler Copland, MD  aspirin 81 MG tablet Take 1 tablet (81 mg total) by mouth daily. 12/04/13  Yes Rogelia Mire, NP  carvedilol (COREG) 25 MG tablet TAKE TWO TABLETS BY MOUTH TWICE DAILY 01/25/16  Yes Larey Dresser, MD  clopidogrel (PLAVIX) 75 MG tablet Take 1 tablet (75 mg total) by mouth daily. With breakfast. 01/23/16  Yes Larey Dresser, MD  colchicine 0.6 MG tablet Take 1 tablet (0.6 mg total) by mouth 2 (two) times daily. 03/25/16  Yes Bhavinkumar Bhagat, PA  diphenhydrAMINE (BENADRYL) 25 mg capsule Take 25 mg by mouth daily as needed for allergies.   Yes Historical Provider, MD  fenofibrate (TRICOR) 145 MG tablet Take 1 tablet (145 mg total) by mouth daily.  10/26/15  Yes Larey Dresser, MD  gabapentin (NEURONTIN) 300 MG capsule Take 1 capsule (300 mg total) by mouth 3 (three) times daily. PATIENT NEEDS OFFICE VISIT FOR ADDITIONAL REFILLS Patient taking differently: Take 300-600 mg by mouth 2 (two) times daily. Take 600 mg every morning  Take 300 mg every evening. 03/31/15  Yes Gay Filler Copland, MD  hydrALAZINE (APRESOLINE) 25 MG tablet Take 1 tablet (25 mg total) by mouth 3 (three) times daily. 01/24/16  Yes Larey Dresser, MD  isosorbide mononitrate (IMDUR) 30 MG 24 hr tablet Take 3 tablets (90 mg total) by mouth daily. 03/31/16  Yes Arbutus Leas, NP  metFORMIN (GLUCOPHAGE) 500 MG tablet Take 250-500 mg by mouth 3 (three) times daily with meals. 500 mg  at breakfast and dinner (or at bedtime), 250 mg at lunch   Yes Historical Provider, MD  Multiple Vitamin (MULTIVITAMIN WITH MINERALS) TABS tablet Take 1 tablet by mouth daily.   Yes Historical Provider, MD  nitroGLYCERIN (NITROLINGUAL) 0.4 MG/SPRAY spray Place 1 spray under the tongue every 5 (five) minutes x 3 doses as needed for chest pain. 01/23/16  Yes Larey Dresser, MD  Omega-3 Fatty Acids (FISH OIL) 1200 MG CAPS Take 1 capsule (1,200 mg total) by mouth 2 (two) times daily. 02/03/15  Yes Larey Dresser, MD  pantoprazole (PROTONIX) 40 MG tablet Take 40 mg by mouth 2 (two) times daily.   Yes Historical Provider, MD  pravastatin (PRAVACHOL) 80 MG tablet Take 1 tablet (80 mg total) by mouth every evening. 10/26/15  Yes Larey Dresser, MD  ranolazine (RANEXA) 500 MG 12 hr tablet Take 1 tablet (500 mg total) by mouth 2 (two) times daily. 12/14/15  Yes Larey Dresser, MD  traMADol (ULTRAM) 50 MG tablet Take 50 mg by mouth 3 (three) times daily. 03/28/16  Yes Historical Provider, MD  vitamin E 400 UNIT capsule Take 400 Units by mouth daily.   Yes Historical Provider, MD  zolpidem (AMBIEN) 10 MG tablet TAKE 1 TABLET AT BEDTIME Patient taking differently: TAKE 1 TABLET AT BEDTIME AS NEEDED FOR SLEEP 01/05/15   Yes Darreld Mclean, MD    Family History Family History  Problem Relation Age of Onset  . Lung cancer Mother   . Anemia Mother   . Polycystic kidney disease Mother   . Diabetes Father   . Heart attack Father 55    Died suddenly  . Heart failure Father   . Hyperlipidemia Father   . Hypertension Father   . Kidney failure Sister 5    Died  . Heart attack Sister   . Hypertension Sister   . Diabetes Sister   . Diabetes Brother   . Polycystic kidney disease Brother   . Diabetes Sister     Social History Social History  Substance Use Topics  . Smoking status: Never Smoker  . Smokeless tobacco: Never Used     Comment: smoked some as a teenager (high school)  . Alcohol use No     Comment: 09/15/2012 "drank a little when I was young"     Allergies   Ciprocin-fluocin-procin [fluocinolone acetonide]; Clarithromycin; Cleocin [clindamycin hcl]; and Glimepiride [amaryl]   Review of Systems Review of Systems  Constitutional: Positive for diaphoresis ( mild, resolved PTA). Negative for fever.  Respiratory: Positive for shortness of breath.   Cardiovascular: Positive for chest pain. Negative for leg swelling.  Gastrointestinal: Negative for abdominal pain, nausea and vomiting.  Genitourinary: Negative for flank pain.  Skin: Negative for wound.  All other systems reviewed and are negative.    Physical Exam Updated Vital Signs BP 120/58   Pulse 82   Temp 98.1 F (36.7 C)   Resp (!) 27   Ht 5\' 5"  (1.651 m)   Wt 110.2 kg   SpO2 94%   BMI 40.44 kg/m   Physical Exam  Constitutional: He appears well-developed and well-nourished. No distress.  Awake, alert, nontoxic appearance  HENT:  Head: Normocephalic and atraumatic.  Mouth/Throat: Oropharynx is clear and moist. No oropharyngeal exudate.  Eyes: Conjunctivae are normal. No scleral icterus.  Neck: Normal range of motion. Neck supple.  Cardiovascular: Normal rate, regular rhythm, normal heart sounds and intact distal  pulses.   Pulmonary/Chest: Effort normal and breath sounds normal.  No respiratory distress. He has no wheezes.  Equal chest expansion  Abdominal: Soft. Bowel sounds are normal. He exhibits no mass. There is no tenderness. There is no rebound and no guarding.  Musculoskeletal: Normal range of motion. He exhibits no edema.  Neurological: He is alert.  Speech is clear and goal oriented Moves extremities without ataxia  Skin: Skin is warm and dry. He is not diaphoretic.  Psychiatric: He has a normal mood and affect.  Nursing note and vitals reviewed.    ED Treatments / Results  Labs (all labs ordered are listed, but only abnormal results are displayed) Labs Reviewed  BASIC METABOLIC PANEL - Abnormal; Notable for the following:       Result Value   CO2 17 (*)    Glucose, Bld 164 (*)    BUN 32 (*)    Creatinine, Ser 2.64 (*)    GFR calc non Af Amer 25 (*)    GFR calc Af Amer 29 (*)    All other components within normal limits  CBC - Abnormal; Notable for the following:    RBC 3.80 (*)    Hemoglobin 10.7 (*)    HCT 33.1 (*)    All other components within normal limits  BRAIN NATRIURETIC PEPTIDE - Abnormal; Notable for the following:    B Natriuretic Peptide 229.3 (*)    All other components within normal limits  I-STAT TROPOININ, ED    EKG  EKG Interpretation  Date/Time:  Wednesday April 04 2016 00:02:47 EDT Ventricular Rate:  90 PR Interval:  156 QRS Duration: 114 QT Interval:  362 QTC Calculation: 442 R Axis:   -57 Text Interpretation:  Normal sinus rhythm Possible Left atrial enlargement Left axis deviation Anterolateral infarct , age undetermined Abnormal ECG No significant change since last tracing Nonspecific ST and T wave abnormality Confirmed by Kathrynn Humble, MD, Thelma Comp [phone]) on 04/04/2016 4:42:29 AM       Radiology Dg Chest 2 View  Result Date: 04/04/2016 CLINICAL DATA:  55 year old male with chest pain and tightness. Chest radiograph dated 03/30/2016 EXAM: CHEST   2 VIEW COMPARISON:  Chest radiograph dated 03/30/2016 FINDINGS: The lungs are clear. There is no pleural effusion or pneumothorax. Top-normal cardiac size. Median sternotomy wires. No acute osseous pathology. IMPRESSION: No active cardiopulmonary disease. Electronically Signed   By: Anner Crete M.D.   On: 04/04/2016 00:58   Procedures Procedures (including critical care time)  Medications Ordered in ED Medications  nitroGLYCERIN (NITROSTAT) SL tablet 0.4 mg (0.4 mg Sublingual Given 04/04/16 0556)  aspirin chewable tablet 324 mg (324 mg Oral Given 04/04/16 0436)     Initial Impression / Assessment and Plan / ED Course  I have reviewed the triage vital signs and the nursing notes.  Pertinent labs & imaging results that were available during my care of the patient were reviewed by me and considered in my medical decision making (see chart for details).  Clinical Course  Value Comment By Time  Troponin i, poc: 0.01 negative Abigail Butts, PA-C 07/26 0432  Hemoglobin: (!) 10.7 Mild, baseline  Abigail Butts, PA-C 07/26 0432  Creatinine: (!) 2.64 baseline Abigail Butts, PA-C 07/26 0433  DG Chest 2 View No acute findings - no PNA, pulmonary edema or pneumothorax Abigail Butts, PA-C 07/26 [place] presents with return of his unstable angina. Patient has been admitted twice in the last 2 weeks for same. He is not a candidate for invasive treatment. Discussed with cardiology who will  assess.  EKG is unchanged and initial troponin is negative.  6:00 AM Cardiology to admit.  Final Clinical Impressions(s) / ED Diagnoses   Final diagnoses:  Unstable angina [facility])  Essential hypertension    New Prescriptions New Prescriptions   No medications on file     Abigail Butts, PA-C 04/04/16 0601    Varney Biles, MD 04/04/16 [phone]

## 2016-04-04 NOTE — ED Notes (Signed)
Cardiology at bedside.

## 2016-04-04 NOTE — ED Triage Notes (Signed)
Pt.. reports left chest pain with SOB and occasional dry cough onset last night .

## 2016-04-04 NOTE — Consult Note (Signed)
   [facility] CM Inpatient Consult   04/04/2016  Matthew Rowe [birth year] [id number]  Patient evaluated for community based chronic disease management services with [place] [facility] as a benefit of patient's Medicare Insurance. Spoke with patient and wife Matthew Rowe at bedside to explain [place] Management services. Patient verbalized he would like some post hospital monitoring.  Patient has had 3 hospital admission/observation stays in the past month.  Patient states he would like some follow up on his medications. Consent form signed.   Patient will receive post hospital discharge call and will be evaluated for monthly home visits for assessments and disease process education.  Left contact information and [facility] literature at bedside. Made Inpatient Case Manager aware that [place] Management following. Of note, [facility] Care Management services does not replace or interfere with any services that are arranged by inpatient case management or social work.  For additional questions or referrals please contact:    Natividad Brood, RN BSN [facility] Liaison  [phone] business mobile phone Toll free office [phone]

## 2016-04-05 LAB — HEMOGLOBIN A1C
Hgb A1c MFr Bld: 6.9 % — ABNORMAL HIGH (ref 4.8–5.6)
Mean Plasma Glucose: 151 mg/dL

## 2016-04-06 ENCOUNTER — Other Ambulatory Visit: Payer: Self-pay

## 2016-04-06 NOTE — Patient Outreach (Signed)
[facility]) Care Management  04/06/2016  Matthew KLECK Sr. 05-Aug-1961 [id number]   Assessment: 54 year old with recent visit to the emergency room for unstable angina. Member reports only issue he is having at this time is with affording his medications. Member reports Ranexa cost $160. Currently he has about a two week supply left, but cannot afford to pay for. Also member reports the cost of his Lorrin Mais has increased. Member states he does not have colchicine, but is not having a gout flare at this time and needs a prescription for fenofibrate, but will ask about a prescription at his next appointment.  Plan: pharmacy referral for medication assistance and home visit planned to assess for additional care management needs.  Thea Silversmith, RN, MSN, [place] Coordinator Cell: [phone]

## 2016-04-11 ENCOUNTER — Ambulatory Visit (INDEPENDENT_AMBULATORY_CARE_PROVIDER_SITE_OTHER): Payer: Medicare Other | Admitting: Cardiology

## 2016-04-11 ENCOUNTER — Encounter: Payer: Self-pay | Admitting: Cardiology

## 2016-04-11 VITALS — BP 154/82 | HR 84 | Ht 65.5 in | Wt 243.0 lb

## 2016-04-11 DIAGNOSIS — I2510 Atherosclerotic heart disease of native coronary artery without angina pectoris: Secondary | ICD-10-CM

## 2016-04-11 DIAGNOSIS — R079 Chest pain, unspecified: Secondary | ICD-10-CM | POA: Diagnosis not present

## 2016-04-11 DIAGNOSIS — N5201 Erectile dysfunction due to arterial insufficiency: Secondary | ICD-10-CM

## 2016-04-11 DIAGNOSIS — N183 Chronic kidney disease, stage 3 unspecified: Secondary | ICD-10-CM

## 2016-04-11 DIAGNOSIS — I10 Essential (primary) hypertension: Secondary | ICD-10-CM

## 2016-04-11 DIAGNOSIS — E785 Hyperlipidemia, unspecified: Secondary | ICD-10-CM

## 2016-04-11 MED ORDER — ISOSORBIDE MONONITRATE ER 120 MG PO TB24: 120.0000 mg | ORAL_TABLET | Freq: Every day | ORAL | 2 refills | Status: DC

## 2016-04-11 NOTE — Patient Instructions (Signed)
Medications:  START Isosorbide 120 mg daily.   Follow-Up:  Your physician recommends that you schedule a follow-up appointment in: 2 weeks with Cecilie Kicks, NP.  If you need a refill on your cardiac medications before your next appointment, please call your pharmacy.

## 2016-04-11 NOTE — Progress Notes (Signed)
Cardiology Office Note   Date:  04/11/2016   ID:  Matthew Rowe., DOB 17-Aug-1961, MRN [id number]  PCP:  Barrie Lyme, FNP  Cardiologist:  Dr. Aundra Dubin     No chief complaint on file.     History of Present Illness: Matthew Rowe. is a 55 y.o. male who presents for post hospital from 04/04/16   significant history of CAD (detailed in Matthew Rowe below), CABG (RIMA to RCA, anomalous RCA off left cusp), DM, HTN, DLD, CKD/III.IV, gout, recurrent angina episodes, recent short admission for CP frmo 03/24/16 to 03/25/16, presents again today to ER for CP evaluation. He reports that all days he was having mild to moderate chest pressure, off and on, associated with elevated BP (systolic [id number]). Denies fever, cough, N/V, orthopnea, PND, edema, palpitations, diaphoresis. Reports compliance with prescribed medications. However, also admits of taking OTC NSAIDS for joints pain attributed to gout. Chest pressure is typically in the midsternal region, nonradiating and sometimes associated with rest, improves with NTG and OTC topical pain medication.  He was admitted to OBS   Troponins were negative. EKG without changes and stable.    Plan was that hopefully his BP will be better controlled off NSAIDS and his Imdur was increased from 90 to 120 mg. . If not, it was recommended to increase his Hydralazine from 25mg  TID to 50mg  TID.   Today his chest pain is improved though still present at times.  BP continues to be elevated.  We discussed monitoring salt intake.  He and his wife ask about Libido Max, he has been taking for erectile dysfunction.          Past Medical History:  Diagnosis Date  . Anginal pain (Matthew Rowe)   . CKD (chronic kidney disease), stage III   . Coronary artery disease    a. INF MI 1999: PCI of RCA;  b.  extensive stenting of LAD;  c. s/p CABG with RIMA->RCA in 2006;  d. Stable, Low risk MV 05/2012; e. 09/2012 Cath: stable anatomy->med Rx. f. Abnl nuc 11/2013 -> med rx; g. 11/2013  Cath/PCI: LM 30d, LAD 50ost, 20/50isr, 100d, LCX 40-50ost, OM2 50ost, RI 70-80ost sup branch, RCA 20p, 90/60d(2.25x17 & 2.25x24 Promus DES'), PDA 60p, RIMA->RCA ok.  . DM2 (diabetes mellitus, type 2) (Matthew Rowe)   . Dyslipidemia   . GERD (gastroesophageal reflux disease)   . History of gout   . Hx of echocardiogram    a. Echo 3/12: mild LVH, EF 55-60%, trivial AI  . Hypertensive heart disease   . Myocardial infarction [facility]) 1999; 2002; 2003; 2006; ~ 2008  . OSA on CPAP   . Polycystic kidney disease     Past Surgical History:  Procedure Laterality Date  . CARDIAC CATHETERIZATION  2012  . CARDIAC CATHETERIZATION  2013  . CARDIAC CATHETERIZATION  2014   LHC (1/14): total occlusion of distal LAD, diffuse disease in ramus, 70% proximal LCx, 50% proximal and mid RCA, 50% distal RCA, patent RIMA-RCA.  Marland Kitchen CORONARY ANGIOPLASTY WITH STENT PLACEMENT  1999 / 2002 / 2004 / 2006?  . CORONARY ARTERY BYPASS GRAFT  2006   CABG X?; in [place]  . CYSTECTOMY  1990's   off face  . LEFT HEART CATHETERIZATION WITH CORONARY/GRAFT ANGIOGRAM N/A 09/16/2012   Procedure: LEFT HEART CATHETERIZATION WITH Matthew Rowe;  Surgeon: Matthew Mocha, MD;  Location: [facility] CATH LAB;  Service: Cardiovascular;  Laterality: N/A;  . LEFT HEART CATHETERIZATION WITH CORONARY/GRAFT ANGIOGRAM N/A 12/02/2013   Procedure:  LEFT HEART CATHETERIZATION WITH Matthew Rowe;  Surgeon: Matthew Hampshire, MD;  Location: [facility] CATH LAB;  Service: Cardiovascular;  Laterality: N/A;  . PERCUTANEOUS CORONARY STENT INTERVENTION (PCI-S) N/A 12/03/2013   Procedure: PERCUTANEOUS CORONARY STENT INTERVENTION (PCI-S);  Surgeon: Matthew Booze, MD;  Location: [facility] CATH LAB;  Service: Cardiovascular;  Laterality: N/A;     Current Outpatient Prescriptions  Medication Sig Dispense Refill  . acetaminophen (TYLENOL) 500 MG tablet Take 1,000 mg by mouth every 8 (eight) hours as needed for mild pain.     Marland Kitchen amLODipine (NORVASC) 10 MG tablet Take 1  tablet (10 mg total) by mouth daily. 90 tablet 3  . aspirin 81 MG tablet Take 1 tablet (81 mg total) by mouth daily.    . carvedilol (COREG) 25 MG tablet TAKE TWO TABLETS BY MOUTH TWICE DAILY 120 tablet 6  . clopidogrel (PLAVIX) 75 MG tablet Take 1 tablet (75 mg total) by mouth daily. With breakfast. 90 tablet 1  . diphenhydrAMINE (BENADRYL) 25 mg capsule Take 25 mg by mouth daily as needed for allergies.    Marland Kitchen gabapentin (NEURONTIN) 300 MG capsule Take 300 mg by mouth 3 (three) times daily.    . hydrALAZINE (APRESOLINE) 25 MG tablet Take 1 tablet (25 mg total) by mouth 3 (three) times daily. 270 tablet 1  . metFORMIN (GLUCOPHAGE) 1000 MG tablet Take 1,000 mg by mouth 2 (two) times daily with a meal.    . Multiple Vitamin (MULTIVITAMIN WITH MINERALS) TABS tablet Take 1 tablet by mouth daily.    . nitroGLYCERIN (NITROLINGUAL) 0.4 MG/SPRAY spray Place 1 spray under the tongue every 5 (five) minutes x 3 doses as needed for chest pain. 12 g 12  . Omega-3 Fatty Acids (FISH OIL) 1200 MG CAPS Take 1 capsule (1,200 mg total) by mouth 2 (two) times daily.    . pantoprazole (PROTONIX) 40 MG tablet Take 40 mg by mouth 2 (two) times daily.    . pravastatin (PRAVACHOL) 80 MG tablet Take 1 tablet (80 mg total) by mouth every evening. 30 tablet 11  . ranolazine (RANEXA) 500 MG 12 hr tablet Take 1 tablet (500 mg total) by mouth 2 (two) times daily. 60 tablet 11  . vitamin E 400 UNIT capsule Take 400 Units by mouth daily.    Marland Kitchen zolpidem (AMBIEN) 10 MG tablet Take 10 mg by mouth at bedtime as needed for sleep.    . isosorbide mononitrate (IMDUR) 120 MG 24 hr tablet Take 1 tablet (120 mg total) by mouth daily. 30 tablet 2   No current facility-administered medications for this visit.     Allergies:   Ciprocin-fluocin-procin [fluocinolone acetonide]; Clarithromycin; Cleocin [clindamycin hcl]; and Glimepiride [amaryl]    Social History:  The patient  reports that he has never smoked. He has never used smokeless  tobacco. He reports that he does not drink alcohol or use drugs.   Family History:  The patient's family history includes Anemia in his mother; Diabetes in his brother, father, sister, and sister; Heart attack in his sister; Heart attack (age of onset: 50) in his father; Heart failure in his father; Hyperlipidemia in his father; Hypertension in his father and sister; Kidney failure (age of onset: 54) in his sister; Lung cancer in his mother; Polycystic kidney disease in his brother and mother.    ROS:  General:no colds or fevers, no weight changes Skin:no rashes or ulcers HEENT:no blurred vision, no congestion CV:see HPI PUL:see HPI GI:no diarrhea constipation or melena, no indigestion  GU:no hematuria, no dysuria MS:no joint pain, no claudication Neuro:no syncope, no lightheadedness Endo:+ diabetes, no thyroid disease  Wt Readings from Last 3 Encounters:  04/11/16 243 lb (110.2 kg)  04/04/16 240 lb 3.2 oz (109 kg)  03/31/16 241 lb 2.9 oz (109.4 kg)     PHYSICAL EXAM: VS:  BP (!) 154/82   Pulse 84   Ht 5' 5.5" (1.664 m)   Wt 243 lb (110.2 kg)   BMI 39.82 kg/m  , BMI Body mass index is 39.82 kg/m. General:Pleasant affect, NAD Skin:Warm and dry, brisk capillary refill HEENT:normocephalic, sclera clear, mucus membranes moist Neck:supple, no JVD, no bruits  Heart:S1S2 RRR without murmur, gallup, rub or click Lungs:clear without rales, rhonchi, or wheezes [id number], non tender, + BS, do not palpate liver spleen or masses Ext:no lower ext edema, 2+ pedal pulses, 2+ radial pulses Neuro:alert and oriented X 3, MAE, follows commands, + facial symmetry    EKG:  EKG is NOT ordered today.    Recent Labs: 03/30/2016: ALT 22 04/04/2016: B Natriuretic Peptide 229.3; BUN 32; Creatinine, Ser 2.52; Hemoglobin 10.8; Magnesium 1.6; Platelets 212; Potassium 4.5; Sodium 136; TSH 3.223    Lipid Panel    Component Value Date/Time   CHOL 177 03/25/2016 0519   TRIG 382 (H) 03/25/2016 0519    HDL 27 (L) 03/25/2016 0519   CHOLHDL 6.6 03/25/2016 0519   VLDL 76 (H) 03/25/2016 0519   LDLCALC 74 03/25/2016 0519   LDLDIRECT 72.0 11/03/2014 0923       Other studies Reviewed: Additional studies/ records that were reviewed today include: . Cardiac CAth:  3.26.15  IMPRESSIONS:  1. Successful PCI of the mid to distal right coronary artery with overlapping promus drug-eluting stents, 2.25 x 24 and 2.25 x 16, postdilated to greater than 2.5 mm in diameter. 2.  LVEDP 26 mmHg. elevated LVEDP, likely related to aggressive hydration post catheterization.  Watch for post procedure shortness of breath again tomorrow since we will hydrate him aggressively after this dye load. 3.   AL2 or CLS 3.5 Guide is suitable for engaging the RCA.  CLS 3.5 was used today since we only had AL2 with sideholes in the cath lab.    RECOMMENDATION:  Continue dual antiplatelet therapy for at least a year. He'll need aggressive secondary prevention.     ASSESSMENT AND PLAN:  1. HTN urgency with NSAIDS and colchicine- imdur increased --today still elevated though chest pain that he had is improved.  Will increase the hydralazine to 50 mg TID.     2. dylipidemia continue statin  had side effects from Lipitor. Very high TGs.  5. HTN   3.  CAD: CABG 2006 at [facility] with [facility].  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  Patient has had prior extensive PCI to LAD with occlusion of the distal LAD.  LHC (7/12): proximal LAD stent, extensive mid-distal LAD stenting, 60-70% mid LAD instent restenosis, subtotal distal LAD instent restenosis, LCFx without significant disease, anomalous RCA off left cusp with mild disease, patent RIMA-RCA.  Echo (3/12): EF 55-60%, trivial AI, mild LVH.  Lexiscan myoview (9/13) with small apical scar, moderate inferior scar from base to apex with some reversibility, EF 47% with apical hypokinesis (similar to prior study).  Chronic angina.  LHC (1/14): total  occlusion of distal LAD, diffuse disease in ramus, 70% proximal LCx, 50% proximal and mid RCA, 50% distal RCA, patent RIMA-RCA.   LHC (3/15) with multiple stents in the LAD  and 50% ostial LAD stenosis, total occlusion distal LAD, 40-50% ostial LCx stenosis, 90% distal RCA stenosis after RIMA insertion.  Patient had DES x 2 to distal RCA. --samples of ranexa given Changed imdur to 120 mg tablet.   .  3. OSA: CPAP.  4. Erectile dysfunction pt will try Libido Max and take BP after taking - I discussed wit Sindy Guadeloupe D and we looked up but did not see anything that raised a flag.    5. Type II diabetes   6. Ischemic cardiomyopathy: EF 55-60% by echo in 3/12, EF 47% with apical hypokinesis in 9/13.  Echo (4/16) with EF 50-55%, mild LVH, mild AS with mean gradient 14 mmHg.   7. Gout  8. Aortic stenosis: Mildt medicines are reviewed with the patient today.   The following changes have been made:  See above Labs/ tests ordered today include:see above  Disposition:   FU:  see above  Signed, Cecilie Kicks, NP  04/11/2016 4:30 PM    [address], [place], [place] Phone: [phone]; Fax: [phone]

## 2016-04-17 ENCOUNTER — Other Ambulatory Visit: Payer: Self-pay

## 2016-04-17 NOTE — Patient Outreach (Signed)
[place] [facility]) Care Management  04/17/2016  Johnson 1960/11/18 [id number]   Care Coordination-Home visit scheduled for the day. RNCM called to inform member that [facility] was on the way. Member reports is not a good time. Request to reschedule appointment and come to main office to complete initial assessment. RNCM returned call to member after checking RNCM's scheduled.  Appointment scheduled for tomorrow. Member to call RNCM back if he needs to change the appointment time.  Plan: complete initial assessment.  Thea Silversmith, RN, MSN, [place] Coordinator Cell: [phone]

## 2016-04-18 ENCOUNTER — Ambulatory Visit: Payer: Self-pay

## 2016-04-18 NOTE — Progress Notes (Signed)
Cardiology Office Note   Date:  04/19/2016   ID:  Matthew Shafiq., DOB 14-Sep-1960, MRN [id number]  PCP:  Barrie Lyme, FNP  Cardiologist:  Dr. Aundra Dubin  /Dr. END   Chief Complaint  Patient presents with  . Hypertension      History of Present Illness: Matthew Rowe. is a 55 y.o. male who presents for BP check.   He has significant history of CAD (detailed in [place] below), CABG (RIMA to RCA, anomalous RCA off left cusp), DM, HTN, DLD, CKD/III.IV, gout, recurrent angina episodes, recent short admission for CP frmo 03/24/16 to 03/25/16, presented to ER for CP evaluation. He reported that he was having mild to moderate chest pressure, off and on, associated with elevated BP (systolic [id number]). However, also admits of taking OTC NSAIDS for joints pain attributed to gout. Chest pressure is typically in the midsternal region, nonradiating and sometimes associated with rest, improves with NTG and OTC topical pain medication. He was admitted to OBS Troponins were negative. EKG without changes and stable.    Plan was that hopefully his BP will be better controlled off NSAIDS and his Imdur was increased from 90 to 120 mg. . If not, it was recommended to increase his Hydralazine from 25mg  TID to 50mg  TID.   His chest pain is now improved though still present at times.  BP controlled.   We discussed monitoring salt intake.   He was taking higher dose of hydralazine than instructed- misunderstanding as he has not yet filled the 50 mg and was taking 3 of 25 mg twice to 3 X a day and feeling cloudy in thinking.  We discussed correct dose. And with BP up and down that may cause low BP and cloudy thinking.       Past Medical History:  Diagnosis Date  . Anginal pain (Rowan)   . CKD (chronic kidney disease), stage III   . Coronary artery disease    a. INF MI 1999: PCI of RCA;  b.  extensive stenting of LAD;  c. s/p CABG with RIMA->RCA in 2006;  d. Stable, Low risk MV 05/2012; e. 09/2012 Cath:  stable anatomy->med Rx. f. Abnl nuc 11/2013 -> med rx; g. 11/2013 Cath/PCI: LM 30d, LAD 50ost, 20/50isr, 100d, LCX 40-50ost, OM2 50ost, RI 70-80ost sup branch, RCA 20p, 90/60d(2.25x17 & 2.25x24 Promus DES'), PDA 60p, RIMA->RCA ok.  . DM2 (diabetes mellitus, type 2) ([place])   . Dyslipidemia   . GERD (gastroesophageal reflux disease)   . History of gout   . Hx of echocardiogram    a. Echo 3/12: mild LVH, EF 55-60%, trivial AI  . Hypertensive heart disease   . Myocardial infarction [facility]) 1999; 2002; 2003; 2006; ~ 2008  . OSA on CPAP   . Polycystic kidney disease     Past Surgical History:  Procedure Laterality Date  . CARDIAC CATHETERIZATION  2012  . CARDIAC CATHETERIZATION  2013  . CARDIAC CATHETERIZATION  2014   LHC (1/14): total occlusion of distal LAD, diffuse disease in ramus, 70% proximal LCx, 50% proximal and mid RCA, 50% distal RCA, patent RIMA-RCA.  Marland Kitchen CORONARY ANGIOPLASTY WITH STENT PLACEMENT  1999 / 2002 / 2004 / 2006?  . CORONARY ARTERY BYPASS GRAFT  2006   CABG X?; in [place]  . CYSTECTOMY  1990's   off face  . LEFT HEART CATHETERIZATION WITH CORONARY/GRAFT ANGIOGRAM N/A 09/16/2012   Procedure: LEFT HEART CATHETERIZATION WITH [name];  Surgeon: [name], MD;  Location:  [place] CATH LAB;  Service: Cardiovascular;  Laterality: N/A;  . LEFT HEART CATHETERIZATION WITH CORONARY/GRAFT ANGIOGRAM N/A 12/02/2013   Procedure: LEFT HEART CATHETERIZATION WITH [name];  Surgeon: [name], MD;  Location: [place] CATH LAB;  Service: Cardiovascular;  Laterality: N/A;  . PERCUTANEOUS CORONARY STENT INTERVENTION (PCI-S) N/A 12/03/2013   Procedure: PERCUTANEOUS CORONARY STENT INTERVENTION (PCI-S);  Surgeon: [name], MD;  Location: [facility] CATH LAB;  Service: Cardiovascular;  Laterality: N/A;     Current Outpatient Prescriptions  Medication Sig Dispense Refill  . acetaminophen (TYLENOL) 500 MG tablet Take 1,000 mg by mouth every 8 (eight) hours as needed  for mild pain.     Marland Kitchen amLODipine (NORVASC) 10 MG tablet Take 1 tablet (10 mg total) by mouth daily. 90 tablet 3  . aspirin 81 MG tablet Take 1 tablet (81 mg total) by mouth daily.    . carvedilol (COREG) 25 MG tablet TAKE TWO TABLETS BY MOUTH TWICE DAILY 120 tablet 6  . clopidogrel (PLAVIX) 75 MG tablet Take 1 tablet (75 mg total) by mouth daily. With breakfast. 90 tablet 1  . diphenhydrAMINE (BENADRYL) 25 mg capsule Take 25 mg by mouth daily as needed for allergies.    Marland Kitchen gabapentin (NEURONTIN) 300 MG capsule Take 300 mg by mouth 3 (three) times daily.    . isosorbide mononitrate (IMDUR) 120 MG 24 hr tablet Take 1 tablet (120 mg total) by mouth daily. 30 tablet 2  . metFORMIN (GLUCOPHAGE) 1000 MG tablet Take 1,000 mg by mouth 2 (two) times daily with a meal.    . Multiple Vitamin (MULTIVITAMIN WITH MINERALS) TABS tablet Take 1 tablet by mouth daily.    . nitroGLYCERIN (NITROLINGUAL) 0.4 MG/SPRAY spray Place 1 spray under the tongue every 5 (five) minutes x 3 doses as needed for chest pain. 12 g 12  . Omega-3 Fatty Acids (FISH OIL) 1200 MG CAPS Take 1 capsule (1,200 mg total) by mouth 2 (two) times daily.    . pantoprazole (PROTONIX) 40 MG tablet Take 40 mg by mouth 2 (two) times daily.    . pravastatin (PRAVACHOL) 80 MG tablet Take 1 tablet (80 mg total) by mouth every evening. 30 tablet 11  . ranolazine (RANEXA) 500 MG 12 hr tablet Take 1 tablet (500 mg total) by mouth 2 (two) times daily. 60 tablet 11  . vitamin E 400 UNIT capsule Take 400 Units by mouth daily.    Marland Kitchen zolpidem (AMBIEN) 10 MG tablet Take 10 mg by mouth at bedtime as needed for sleep.    . hydrALAZINE (APRESOLINE) 50 MG tablet Take 1 tablet (50 mg total) by mouth 3 (three) times daily. 270 tablet 3   No current facility-administered medications for this visit.     Allergies:   Ciprocin-fluocin-procin [fluocinolone acetonide]; Clarithromycin; Cleocin [clindamycin hcl]; and Glimepiride [amaryl]    Social History:  The patient   reports that he has never smoked. He has never used smokeless tobacco. He reports that he does not drink alcohol or use drugs.   Family History:  The patient's family history includes Anemia in his mother; Diabetes in his brother, father, sister, and sister; Heart attack in his sister; Heart attack (age of onset: 17) in his father; Heart failure in his father; Hyperlipidemia in his father; Hypertension in his father and sister; Kidney failure (age of onset: 17) in his sister; Lung cancer in his mother; Polycystic kidney disease in his brother and mother.    ROS:  General:no colds or fevers, +  weight loss Skin:no rashes or ulcers HEENT:no blurred vision, no congestion CV:see HPI PUL:see HPI Neuro:no syncope, no lightheadedness Endo:+ diabetes, no thyroid disease  Wt Readings from Last 3 Encounters:  04/19/16 238 lb 12.8 oz (108.3 kg)  04/11/16 243 lb (110.2 kg)  04/04/16 240 lb 3.2 oz (109 kg)     PHYSICAL EXAM: VS:  BP 128/80   Pulse 80   Ht 5' 5.5" (1.664 m)   Wt 238 lb 12.8 oz (108.3 kg)   BMI 39.13 kg/m  , BMI Body mass index is 39.13 kg/m. General:Pleasant affect, NAD Skin:Warm and dry, brisk capillary refill HEENT:normocephalic, sclera clear, mucus membranes moist Neck:supple, no JVD, no bruits  Heart:S1S2 RRR without murmur, gallup, rub or click Lungs:clear without rales, rhonchi, or wheezes [id number], non tender, + BS, do not palpate liver spleen or masses Ext:no lower ext edema, 2+ pedal pulses, 2+ radial pulses Neuro:alert and oriented, MAE, follows commands, + facial symmetry    EKG:  EKG is NOT ordered today.    Recent Labs: 03/30/2016: ALT 22 04/04/2016: B Natriuretic Peptide 229.3; BUN 32; Creatinine, Ser 2.52; Hemoglobin 10.8; Magnesium 1.6; Platelets 212; Potassium 4.5; Sodium 136; TSH 3.223    Lipid Panel    Component Value Date/Time   CHOL 177 03/25/2016 0519   TRIG 382 (H) 03/25/2016 0519   HDL 27 (L) 03/25/2016 0519   CHOLHDL 6.6 03/25/2016 0519    VLDL 76 (H) 03/25/2016 0519   LDLCALC 74 03/25/2016 0519   LDLDIRECT 72.0 11/03/2014 0923       Other studies Reviewed: Additional studies/ records that were reviewed today include:see previous ov notes   ASSESSMENT AND PLAN:  1. HTN urgency with NSAIDS and colchicine- imdur increased --today bp great on current meds though he was taking the hydralizine  75 mg TID, he will go back to 50 mg TID.  At 75 mg TID he would have drops in BP and felt cloudy in his thinking.      2. dylipidemia continue statin  had side effects from Lipitor. Very high TGs.    3.  CAD: CABG 2006 at [facility] with [facility]. 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 Patient has had prior extensive PCI to LAD with occlusion of the distal LAD. LHC (7/12): proximal LAD stent, extensive mid-distal LAD stenting, 60-70% mid LAD instent restenosis, subtotal distal LAD instent restenosis, LCFx without significant disease, anomalous RCA off left cusp with mild disease, patent RIMA-RCA. Echo (3/12): EF 55-60%, trivial AI, mild LVH. Lexiscan myoview (9/13) with small apical scar, moderate inferior scar from base to apex with some reversibility, EF 47% with apical hypokinesis (similar to prior study). Chronic angina. LHC (1/14): total occlusion of distal LAD, diffuse disease in ramus, 70% proximal LCx, 50% proximal and mid RCA, 50% distal RCA, patent RIMA-RCA. LHC (3/15) with multiple stents in the LAD and 50% ostial LAD stenosis, total occlusion distal LAD, 40-50% ostial LCx stenosis, 90% distal RCA stenosis after RIMA insertion. Patient had DES x 2 to distal RCA. --samples of ranexa given Changed imdur to 120 mg tablet.   .  4. OSA: CPAP.  5. Erectile dysfunction pt tried Libido Max and take BP after taking -BP was stable.  6. Type II diabetes per Im   7. Ischemic cardiomyopathy: EF 55-60% by echo in 3/12, EF 47% with apical hypokinesis in 9/13. Echo (4/16) with EF 50-55%, mild LVH, mild AS  with mean gradient 14 mmHg.   He will follow up  with Dr. Saunders Revel per Dr. Claris Gladden recommendations and needs to see him in 2 months.   Current medicines are reviewed with the patient today.  The patient Has no concerns regarding medicines.  The following changes have been made:  See above Labs/ tests ordered today include:see above  Disposition:   FU:  see above  Signed, Cecilie Kicks, NP  04/19/2016 6:17 PM    [address], [place], [place], [place] Phone: [phone]; Fax: [phone]

## 2016-04-19 ENCOUNTER — Encounter: Payer: Self-pay | Admitting: Cardiology

## 2016-04-19 ENCOUNTER — Ambulatory Visit (INDEPENDENT_AMBULATORY_CARE_PROVIDER_SITE_OTHER): Payer: Medicare Other | Admitting: Cardiology

## 2016-04-19 VITALS — BP 128/80 | HR 80 | Ht 65.5 in | Wt 238.8 lb

## 2016-04-19 DIAGNOSIS — I2510 Atherosclerotic heart disease of native coronary artery without angina pectoris: Secondary | ICD-10-CM | POA: Diagnosis not present

## 2016-04-19 DIAGNOSIS — I10 Essential (primary) hypertension: Secondary | ICD-10-CM

## 2016-04-19 DIAGNOSIS — N189 Chronic kidney disease, unspecified: Secondary | ICD-10-CM | POA: Diagnosis not present

## 2016-04-19 DIAGNOSIS — I5032 Chronic diastolic (congestive) heart failure: Secondary | ICD-10-CM

## 2016-04-19 LAB — BASIC METABOLIC PANEL
BUN: 28 mg/dL — ABNORMAL HIGH (ref 7–25)
CHLORIDE: 109 mmol/L (ref 98–110)
CO2: 18 mmol/L — ABNORMAL LOW (ref 20–31)
Calcium: 9.3 mg/dL (ref 8.6–10.3)
Creat: 2.47 mg/dL — ABNORMAL HIGH (ref 0.70–1.33)
Glucose, Bld: 113 mg/dL — ABNORMAL HIGH (ref 65–99)
POTASSIUM: 4.7 mmol/L (ref 3.5–5.3)
SODIUM: 137 mmol/L (ref 135–146)

## 2016-04-19 MED ORDER — HYDRALAZINE HCL 50 MG PO TABS: 50.0000 mg | ORAL_TABLET | Freq: Three times a day (TID) | ORAL | 3 refills | Status: DC

## 2016-04-19 NOTE — Patient Instructions (Signed)
Medication Instructions:  Your physician recommends that you continue on your current medications as directed. Please refer to the Current Medication list given to you today.  Labwork: TODAY: BMET  Testing/Procedures: None ordered  Follow-Up: Your physician recommends that you schedule a follow-up appointment in: 2 MONTHS WITH DR END AT [facility].   Any Other Special Instructions Will Be Listed Below (If Applicable).     If you need a refill on your cardiac medications before your next appointment, please call your pharmacy.

## 2016-04-24 ENCOUNTER — Other Ambulatory Visit: Payer: Self-pay

## 2016-04-24 NOTE — Patient Outreach (Signed)
[facility]) Care Management  04/24/2016  Matthew STACHOWSKI Sr. [birth year] [id number]   55 year old with recent observation visit for chest pain. History of hypertension.diabetes, and heart failure. Member was a no show at scheduled visit today. Member rescheduled previous home visit. Then requested to be seen at Care Management office-no show.  Plan: call to follow up; attempt to reschedule/assess for care management needs.  Thea Silversmith, RN, MSN, [place] Coordinator Cell: [phone]

## 2016-04-27 ENCOUNTER — Other Ambulatory Visit: Payer: Self-pay

## 2016-04-27 NOTE — Patient Outreach (Signed)
[place] [facility]) Care Management  04/27/2016  Palmer 02/20/1961 [id number]   RNCM called to follow up. Member missed scheduled office visit appointment earlier this week. RNCM spoke with member who apologized for missing the appointment stating he had an emergency come up and he had a lot of running around to do. Member is agreeable to rescheduling to a telephonic assessment.  Plan: telephonic assessment scheduled.  Thea Silversmith, RN, MSN, [place] Coordinator Cell: [phone]

## 2016-04-30 ENCOUNTER — Other Ambulatory Visit: Payer: Self-pay

## 2016-04-30 NOTE — Patient Outreach (Signed)
[place] [facility]) Care Management  04/30/2016  Matthew ANGIOLILLO Sr. 06-27-1961 PO:[id number]  Assessment: 55 year old with recent observation hospital stay for chest pain. History of hypertension, diabetes and heart failure. Member has been difficult to follow due to missed appointments. Assessment has been difficult to obtain due to Member has either missed or changed several appointments. RNCM scheduled telephonic assessment today. However there was no answer. RNCM was unable to leave message.     Plan: continue to try to reach member.  Thea Silversmith, RN, MSN, [place] Coordinator Cell: [phone]

## 2016-05-02 ENCOUNTER — Other Ambulatory Visit: Payer: Self-pay

## 2016-05-02 NOTE — Patient Outreach (Addendum)
[place] [facility]) Care Management  05/02/2016  [place] Jun 01, 1961 PO:[id number]  RNCM called to follow up regarding care management needs/complete assessment. No answer and does not have a voicemail set up. RNCM has made numerous attempts to meet/speak with member- member did not answer phone and voicemail is not set up.  Plan: send outreach letter.  Thea Silversmith, RN, MSN, [place] Coordinator Cell: [phone]

## 2016-05-03 ENCOUNTER — Other Ambulatory Visit: Payer: Self-pay

## 2016-05-03 NOTE — Patient Outreach (Signed)
[place] [facility]) Care Management  05/03/2016  [place] [birth year] [id number]   Referral received following hospitial stay due to unstable angina. Matthew Rowe has a history of heart failure. Call to complete assessment. Matthew Rowe reports that he had company out of town and Friday is a better day to call to complete assessment, however, he agreed to begin assessment today.   History of diabetes. Matthew Rowe to follow up with primary care for diabetes supplies. Member reports his blood sugars have been running ar 120's-175.   Matthew Rowe also has a history of heart failure. He reports he feels that it is controlled.  Matthew Rowe reports the cost of Ranexa is expensive and currently he is receiving samples. Matthew Rowe states that someone called him asking about his financial information and he does not like giving his financial information out. Member has follow up appointment dates scheduled with cardiologist and will follow up with them regarding medication.  Member reports his primary care is Gwenlyn Perking with [facility]. RNCM discussed with Matthew Rowe that he may not be eligible for [facility] program. RNCM will check status and follow up with member.  Thea Silversmith, RN, MSN, [place] Coordinator Cell: [phone]

## 2016-05-04 ENCOUNTER — Other Ambulatory Visit: Payer: Self-pay

## 2016-05-04 NOTE — Patient Outreach (Addendum)
[place] [facility]) Care Management  05/04/2016  [place] 01-Jul-1961 [id number]   RNCM received confirmation that Mr. Bork is not eligible for services due to primary care is not a [facility] provider. RNCM called to update him. No answer. Unable to leave message.  Plan: RNCM will call again and RNCM will send a closure letter if unable to reach.  Thea Silversmith, RN, MSN, [place] Coordinator Cell: [phone]

## 2016-05-04 NOTE — Patient Outreach (Signed)
[place] [facility]) Care Management  05/04/2016  [place] 10-15-1960 [id number]  RNCM received notification from case management assistant that member is not eligible for services. RNCM called and informed Mr. Beh. Mr Retterer primary concern at this time is the cost of Ranexa. He has a follow up appointment in September and reports he will discuss with them at that time. Mr. Winterberg acknowledges that he has enough medication to last until the visit. RNCM reinforced with member that financial information is usually needed when trying to apply for assistance with medications. Member acknowledged understanding. Member stated he would follow up with his docotr.  Plan:close case.  Thea Silversmith, RN, MSN, [place] Coordinator Cell: [phone]

## 2016-05-10 NOTE — Progress Notes (Deleted)
Cardiology Office Note:    Date:  05/10/2016   ID:  Matthew Kirks Sr., DOB 1961/08/14, MRN [id number]  PCP:  Barrie Lyme, FNP  Cardiologist:  Dr. [name]   Electrophysiologist:  Dr. [name]   Referring MD: Barrie Lyme, FNP   No chief complaint on file.   History of Present Illness:    Matthew Mcadoo. is a 55 y.o. male with a hx of ***  CAD, status post CABG in 2006 and [place] ([place]), status post prior extensive stenting of the LAD and subsequent occlusion of the distal vessel, diabetes, HTN, HL, CKD. Patient has a history of chronic daily angina. He has been treated with a combination of amlodipine, Ranexa, isosorbide and Neurontin.  In 1/14, he developed progressive angina and was hospitalized. LHC was done, showing unchanged anatomy. RIMA-RCA was patent. He did well after this on ranolazine. In 4/14, he ran out of ranolazine and the chest pain returned. He was admitted overnight in 4/14 and ruled out for MI. In 7/14, he had again run low on ranolazine and developed more chest pain. He was admitted again and ruled out for MI. In 3/15, he was admitted with recurrent chest pain. LHC was done, showing 90% distal RCA stenosis after RIMA insertion. He had Promus DES x 2 to distal RCA. He ran out of amlodipine and losartan in 1/16 and developed chest pain in the setting of elevated BP. He was admitted and ruled out for MI. Chest pain resolved with BP control. Last seen in this office by Dr. Aundra Dubin 11/03/14.  Admitted 4/22-4/24 With progressively worsening chest pain in the setting of hypertensive urgency. He had been nonadherent his medications due to financial constraints. He was admitted and placed on IV nitroglycerin and medications were resumed. Cardiac enzymes remained negative. Echocardiogram demonstrated normal LV function, mild diastolic dysfunction, mild aortic stenosis with mean gradient 14 mmHg. He returns for follow-up.  Prior CV studies  that were reviewed today include:    *** LHC/PCI 11/2013  Left Main: Normal in size with 30 % distal stenosis. Left Anterior Descending (LAD): Multiple stents are noted throughout the vessel. Ostial 50%, diffuse proximal 20% ISR, mid and distal diffuse 50% ISR, distal LAD is occluded Circumflex (LCx): Small in size. There is 40-50% ostial stenosis. Ostial OM2 50% Ramus Intermedius: This bifurcates into 2 branches. Superior Br 70-80% ostial stenosis.  Right Coronary Artery: Diffuse proximal 20%, distal 90% after the anastomosis with RIMA . Distal 60% extending into the right PDA.  The PDA proximal 60%, posterior AV segment 30% ostial RIMA-RCA patent PCI: mid to distal RCA with overlapping Promus DES, 2.25 x 24 and 2.25 x 16 LVEDP 26 mmHg.   Echo 01/02/15 Study Conclusions - Mild LVH. EF 50% to 55%.Wall motion was normal; Grade 1 diastolic dysfunction. - Aortic valve: There was very mild stenosis. There was mildregurgitation. Valve area (VTI): 1.7 cm^2. Valve area (Vmax):1.61 cm^2. Valve area (Vmean): 1.54 cm^2.  Mean gradient (S): 72mm Hg. Peak gradient (S): 26 mm Hg. - Mitral valve: Calcified annulus. Mildly thickened leaflets.  There was mild regurgitation.  Nuclear 11/28/13 IMPRESSION: Non reversible decreased myocardial perfusion at the left ventricular apex. Large perfusion defect involving the inferolateral wall the left ventricle with partial reversibility particularly at the lateral margin of the defect on resting images. When compared to the previous exam the inferior wall defect extends further into the posterior wall and lateral wall than on the previous study. Normal left ventricular ejection  fraction of 54%, slightly increased from the 46% on the previous exam.   Past Medical History:  Diagnosis Date  . Anginal pain ([place])   . CKD (chronic kidney disease), stage III   . Coronary artery disease    a. INF MI 1999: PCI of RCA;  b.  extensive stenting of LAD;  c. s/p  CABG with RIMA->RCA in 2006;  d. Stable, Low risk MV 05/2012; e. 09/2012 Cath: stable anatomy->med Rx. f. Abnl nuc 11/2013 -> med rx; g. 11/2013 Cath/PCI: LM 30d, LAD 50ost, 20/50isr, 100d, LCX 40-50ost, OM2 50ost, RI 70-80ost sup branch, RCA 20p, 90/60d(2.25x17 & 2.25x24 Promus DES'), PDA 60p, RIMA->RCA ok.  . DM2 (diabetes mellitus, type 2) ([place])   . Dyslipidemia   . GERD (gastroesophageal reflux disease)   . History of gout   . Hx of echocardiogram    a. Echo 3/12: mild LVH, EF 55-60%, trivial AI  . Hypertensive heart disease   . Myocardial infarction [facility] 1999; 2002; 2003; 2006; ~ 2008  . OSA on CPAP   . Polycystic kidney disease   1. CAD: CABG 2006 at [facility] with [place]. 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 Patient has had prior extensive PCI to LAD with occlusion of the distal LAD. LHC (7/12): proximal LAD stent, extensive mid-distal LAD stenting, 60-70% mid LAD instent restenosis, subtotal distal LAD instent restenosis, LCFx without significant disease, anomalous RCA off left cusp with mild disease, patent RIMA-RCA. Echo (3/12): EF 55-60%, trivial AI, mild LVH. Lexiscan myoview (9/13) with small apical scar, moderate inferior scar from base to apex with some reversibility, EF 47% with apical hypokinesis (similar to prior study). Chronic angina. LHC (1/14): total occlusion of distal LAD, diffuse disease in ramus, 70% proximal LCx, 50% proximal and mid RCA, 50% distal RCA, patent RIMA-RCA. LHC (3/15) with multiple stents in the LAD and 50% ostial LAD stenosis, total occlusion distal LAD, 40-50% ostial LCx stenosis, 90% distal RCA stenosis after RIMA insertion. Patient had DES x 2 to distal RCA.  2. OSA: CPAP. 3. Type II diabetes 4. Hyperlipidemia: had side effects from Lipitor. Very high TGs.  5. HTN 6. CKD: polycystic kidney disease.  7. Ischemic cardiomyopathy: EF 55-60% by echo in 3/12, EF 47% with apical hypokinesis in 9/13.  Past Surgical  History:  Procedure Laterality Date  . CARDIAC CATHETERIZATION  2012  . CARDIAC CATHETERIZATION  2013  . CARDIAC CATHETERIZATION  2014   LHC (1/14): total occlusion of distal LAD, diffuse disease in ramus, 70% proximal LCx, 50% proximal and mid RCA, 50% distal RCA, patent RIMA-RCA.  Marland Kitchen CORONARY ANGIOPLASTY WITH STENT PLACEMENT  1999 / 2002 / 2004 / 2006?  . CORONARY ARTERY BYPASS GRAFT  2006   CABG X?; in [place]  . CYSTECTOMY  1990's   off face  . LEFT HEART CATHETERIZATION WITH CORONARY/GRAFT ANGIOGRAM N/A 09/16/2012   Procedure: LEFT HEART CATHETERIZATION WITH [name];  Surgeon: [name], MD;  Location[facility] C[facility] LAB;  Service: Cardiovascular;  Laterality: N/A;  . LEFT HEART CATHETERIZATION WITH CORONARY/GRAFT ANGIOGRAM N/A 12/02/2013   Procedure: LEFT HEART CATHETERIZATION WITH [name];  Surgeon: [name], MD;  Location: [facility] CATH LAB;  Service: Cardiovascular;  Laterality: N/A;  . PERCUTANEOUS CORONARY STENT INTERVENTION (PCI-S) N/A 12/03/2013   Procedure: PERCUTANEOUS CORONARY STENT INTERVENTION (PCI-S);  Surgeon: [name], MD;  Location: [facility] CATH LAB;  Service: Cardiovascular;  Laterality: N/A;    Current Medications: Outpatient Medications Prior to Visit  Medication Sig Dispense Refill  . acetaminophen (TYLENOL) 500 MG tablet Take 1,000 mg by mouth every 8 (eight) hours as needed for mild pain.     Marland Kitchen amLODipine (NORVASC) 10 MG tablet Take 1 tablet (10 mg total) by mouth daily. 90 tablet 3  . aspirin 81 MG tablet Take 1 tablet (81 mg total) by mouth daily.    . carvedilol (COREG) 25 MG tablet TAKE TWO TABLETS BY MOUTH TWICE DAILY 120 tablet 6  . clopidogrel (PLAVIX) 75 MG tablet Take 1 tablet (75 mg total) by mouth daily. With breakfast. 90 tablet 1  . diphenhydrAMINE (BENADRYL) 25 mg capsule Take 25 mg by mouth daily as needed for allergies.    Marland Kitchen gabapentin (NEURONTIN) 300 MG capsule Take 300 mg by mouth 3 (three) times daily.      . hydrALAZINE (APRESOLINE) 50 MG tablet Take 1 tablet (50 mg total) by mouth 3 (three) times daily. 270 tablet 3  . isosorbide mononitrate (IMDUR) 120 MG 24 hr tablet Take 1 tablet (120 mg total) by mouth daily. 30 tablet 2  . metFORMIN (GLUCOPHAGE) 1000 MG tablet Take 1,000 mg by mouth 2 (two) times daily with a meal.    . Multiple Vitamin (MULTIVITAMIN WITH MINERALS) TABS tablet Take 1 tablet by mouth daily.    . nitroGLYCERIN (NITROLINGUAL) 0.4 MG/SPRAY spray Place 1 spray under the tongue every 5 (five) minutes x 3 doses as needed for chest pain. 12 g 12  . Omega-3 Fatty Acids (FISH OIL) 1200 MG CAPS Take 1 capsule (1,200 mg total) by mouth 2 (two) times daily.    . pantoprazole (PROTONIX) 40 MG tablet Take 40 mg by mouth 2 (two) times daily.    . pravastatin (PRAVACHOL) 80 MG tablet Take 1 tablet (80 mg total) by mouth every evening. 30 tablet 11  . ranolazine (RANEXA) 500 MG 12 hr tablet Take 1 tablet (500 mg total) by mouth 2 (two) times daily. 60 tablet 11  . vitamin E 400 UNIT capsule Take 400 Units by mouth daily.    Marland Kitchen zolpidem (AMBIEN) 10 MG tablet Take 10 mg by mouth at bedtime as needed for sleep.     No facility-administered medications prior to visit.       Allergies:   Ciprocin-fluocin-procin [fluocinolone acetonide]; Clarithromycin; Cleocin [clindamycin hcl]; and Glimepiride [amaryl]   Social History   Social History  . Marital status: Married    Spouse name: Matthew Rowe  . Number of children: 5  . Years of education: N/A   Occupational History  . retired     Chartered loss adjuster   Social History Main Topics  . Smoking status: Never Smoker  . Smokeless tobacco: Never Used     Comment: smoked some as a teenager (high school)  . Alcohol use No     Comment: 09/15/2012 "drank a little when I was young"  . Drug use: No  . Sexual activity: Yes   Other Topics Concern  . Not on file   Social History Narrative   Married and lives with wife in [place]. On disability.    Consumes  about 2 Coke Zeros a day      Family History:  The patient's ***family history includes Anemia in his mother; Diabetes in his brother, father, sister, and sister; Heart attack in his sister; Heart attack (age of onset: 31) in his father; Heart failure in his father; Hyperlipidemia in his father; Hypertension in his father and sister; Kidney failure (age of onset: 35) in his  sister; Lung cancer in his mother; Polycystic kidney disease in his brother and mother.   ROS:   Please see the history of present illness.    ROS All other systems reviewed and are negative.   EKGs/Labs/Other Test Reviewed:    EKG:  EKG is *** ordered today.  The ekg ordered today demonstrates ***  Recent Labs: 03/30/2016: ALT 22 04/04/2016: B Natriuretic Peptide 229.3; Hemoglobin 10.8; Magnesium 1.6; Platelets 212; TSH 3.223 04/19/2016: BUN 28; Creat 2.47; Potassium 4.7; Sodium 137   Recent Lipid Panel    Component Value Date/Time   CHOL 177 03/25/2016 0519   TRIG 382 (H) 03/25/2016 0519   HDL 27 (L) 03/25/2016 0519   CHOLHDL 6.6 03/25/2016 0519   VLDL 76 (H) 03/25/2016 0519   LDLCALC 74 03/25/2016 0519   LDLDIRECT 72.0 11/03/2014 0923     Physical Exam:    VS:  There were no vitals taken for this visit.    Wt Readings from Last 3 Encounters:  04/19/16 238 lb 12.8 oz (108.3 kg)  04/11/16 243 lb (110.2 kg)  04/04/16 240 lb 3.2 oz (109 kg)     ***Physical Exam  ASSESSMENT:    1. Coronary artery disease involving native coronary artery with other forms of angina pectoris ([place])   2. CKD (chronic kidney disease), stage III   3. Essential hypertension   4. Dyslipidemia   5. OSA on CPAP    PLAN:    In order of problems listed above:  ***   Medication Adjustments/Labs and Tests Ordered: Current medicines are reviewed at length with the patient today.  Concerns regarding medicines are outlined above.  Medication changes, Labs and Tests ordered today are outlined in the Patient Instructions noted  below. There are no Patient Instructions on file for this visit. Signed, Richardson Dopp, PA-C  05/10/2016 8:53 PM    [facility] [address], [place], [place]  [address] Phone: [phone]; Fax: [phone]

## 2016-05-11 ENCOUNTER — Telehealth: Payer: Self-pay | Admitting: Internal Medicine

## 2016-05-11 ENCOUNTER — Ambulatory Visit: Payer: Medicare Other | Admitting: Physician Assistant

## 2016-05-11 NOTE — Telephone Encounter (Signed)
CALLED TO TELL PT THAT 4 BOXES ARE UP FRONT OF RANEXA  500 MG, PT AWARE

## 2016-05-11 NOTE — Telephone Encounter (Signed)
Patient has an appointment with Dr. Saunders Revel on 06/07/16 --switching from Dr. Aundra Dubin to Dr. Saunders Revel.  He needs samples of Ranexa to hold him over until then.

## 2016-05-18 ENCOUNTER — Other Ambulatory Visit: Payer: Self-pay | Admitting: Cardiology

## 2016-06-04 ENCOUNTER — Other Ambulatory Visit: Payer: Self-pay

## 2016-06-07 ENCOUNTER — Ambulatory Visit (INDEPENDENT_AMBULATORY_CARE_PROVIDER_SITE_OTHER): Payer: Medicare Other | Admitting: Internal Medicine

## 2016-06-07 ENCOUNTER — Encounter: Payer: Self-pay | Admitting: Internal Medicine

## 2016-06-07 VITALS — BP 140/80 | HR 84 | Ht 65.5 in | Wt 233.0 lb

## 2016-06-07 DIAGNOSIS — I208 Other forms of angina pectoris: Secondary | ICD-10-CM | POA: Diagnosis not present

## 2016-06-07 DIAGNOSIS — I25118 Atherosclerotic heart disease of native coronary artery with other forms of angina pectoris: Secondary | ICD-10-CM | POA: Diagnosis not present

## 2016-06-07 DIAGNOSIS — I119 Hypertensive heart disease without heart failure: Secondary | ICD-10-CM

## 2016-06-07 DIAGNOSIS — I10 Essential (primary) hypertension: Secondary | ICD-10-CM

## 2016-06-07 DIAGNOSIS — I2089 Other forms of angina pectoris: Secondary | ICD-10-CM

## 2016-06-07 MED ORDER — HYDRALAZINE HCL 50 MG PO TABS: 50.0000 mg | ORAL_TABLET | Freq: Three times a day (TID) | ORAL | 11 refills | Status: DC

## 2016-06-07 MED ORDER — HYDRALAZINE HCL 50 MG PO TABS: 50.0000 mg | ORAL_TABLET | Freq: Three times a day (TID) | ORAL | 3 refills | Status: DC

## 2016-06-07 MED ORDER — PRAVASTATIN SODIUM 80 MG PO TABS: 80.0000 mg | ORAL_TABLET | Freq: Every evening | ORAL | 11 refills | Status: DC

## 2016-06-07 NOTE — Progress Notes (Signed)
Follow-up Outpatient Visit Date: 06/07/2016  Chief Complaint  Patient presents with  . Follow-up    Coronary artery disease with stable angina    HPI:  Mr. Bonet is a 55 y.o. 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  The patient was previously followed by Dr. Aundra Dubin and was last seen in our office by Cecilie Kicks on 04/19/16.  At his last visitIn August, hydralazine was increased to 50 mg 3 times a day. Since that time, Mr. Knoble has felt relatively well. He continues to have angina when he "overdoes it." However, the frequency and intensity of episodes have not increased. The patient happily reports that he has lost about 20 pounds over the last few months and has noticed improvement in his energy. He continues to take sublingual nitroglycerin on occasion for his angina. He reports being compliant with his medications, including the increased dose of hydralazine. Mr. Slyter also remains on isosorbide mononitrate 120 mg daily and ranolazine 500 mg twice a day. He is in the process of trying to receive a tier reduction from his insurance for ranolazine, rather than rely on samples. He denies lightheadedness, dizziness, palpitations, and edema. He sleeps on 2 pillows at baseline. He is compliant with CPAP.  --------------------------------------------------------------------------------------------------  Past Medical History:  Diagnosis Date  . Anginal pain ([place])   . CKD (chronic kidney disease), stage III   . Coronary artery disease    a. INF MI 1999: PCI of RCA;  b.  extensive stenting of LAD;  c. s/p CABG with RIMA->RCA in 2006;  d. Stable, Low risk MV 05/2012; e. 09/2012 Cath: stable anatomy->med Rx. f. Abnl nuc 11/2013 -> med rx; g.  11/2013 Cath/PCI: LM 30d, LAD 50ost, 20/50isr, 100d, LCX 40-50ost, OM2 50ost, RI 70-80ost sup branch, RCA 20p, 90/60d(2.25x17 & 2.25x24 Promus DES'), PDA 60p, RIMA->RCA ok.  . DM2 (diabetes mellitus, type 2) ([place])   . Dyslipidemia   . GERD (gastroesophageal reflux disease)   . History of gout   . Hx of echocardiogram    a. Echo 3/12: mild LVH, EF 55-60%, trivial AI  . Hypertensive heart disease   . Myocardial infarction [facility]) 1999; 2002; 2003; 2006; ~ 2008  . OSA on CPAP   . Polycystic kidney disease     Past Surgical History:  Procedure Laterality Date  . CARDIAC CATHETERIZATION  2012  . CARDIAC CATHETERIZATION  2013  . CARDIAC CATHETERIZATION  2014   LHC (1/14): total occlusion of distal LAD, diffuse disease in ramus, 70% proximal LCx, 50% proximal and mid RCA, 50% distal RCA, patent RIMA-RCA.  Marland Kitchen CORONARY ANGIOPLASTY WITH STENT PLACEMENT  1999 / 2002 / 2004 / 2006?  . CORONARY ARTERY BYPASS GRAFT  2006   CABG X?; in [place]  . CYSTECTOMY  1990's   off face  . LEFT HEART CATHETERIZATION WITH CORONARY/GRAFT ANGIOGRAM N/A 09/16/2012   Procedure: LEFT HEART CATHETERIZATION WITH [name];  Surgeon: [name], MD;  Location: [facility] CATH LAB;  Service: Cardiovascular;  Laterality: N/A;  . LEFT HEART CATHETERIZATION WITH CORONARY/GRAFT ANGIOGRAM N/A 12/02/2013   Procedure: LEFT HEART CATHETERIZATION WITH [name];  Surgeon: [name], MD;  Location: [facility] CATH LAB;  Service: Cardiovascular;  Laterality: N/A;  . PERCUTANEOUS CORONARY STENT INTERVENTION (PCI-S) N/A 12/03/2013  Procedure: PERCUTANEOUS CORONARY STENT INTERVENTION (PCI-S);  Surgeon: [name], MD;  Location: [facility] CATH LAB;  Service: Cardiovascular;  Laterality: N/A;    Outpatient Encounter Prescriptions as of 06/07/2016  Medication Sig  . acetaminophen (TYLENOL) 500 MG tablet Take 1,000 mg by mouth every 8 (eight) hours as needed for mild pain.   Marland Kitchen amLODipine (NORVASC) 10 MG tablet  Take 1 tablet (10 mg total) by mouth daily.  Marland Kitchen aspirin 81 MG tablet Take 1 tablet (81 mg total) by mouth daily.  . carvedilol (COREG) 25 MG tablet TAKE TWO TABLETS BY MOUTH TWICE DAILY  . clopidogrel (PLAVIX) 75 MG tablet Take 1 tablet (75 mg total) by mouth daily. With breakfast.  . diphenhydrAMINE (BENADRYL) 25 mg capsule Take 25 mg by mouth daily as needed for allergies.  Marland Kitchen gabapentin (NEURONTIN) 300 MG capsule Take 300 mg by mouth 3 (three) times daily.  . hydrALAZINE (APRESOLINE) 50 MG tablet Take 1 tablet (50 mg total) by mouth 3 (three) times daily.  . hydrochlorothiazide (HYDRODIURIL) 25 MG tablet Take 25 mg by mouth daily.  . isosorbide mononitrate (IMDUR) 120 MG 24 hr tablet Take 1 tablet (120 mg total) by mouth daily.  . metFORMIN (GLUCOPHAGE) 1000 MG tablet Take 1,000 mg by mouth 2 (two) times daily with a meal.  . Multiple Vitamin (MULTIVITAMIN WITH MINERALS) TABS tablet Take 1 tablet by mouth daily.  . nitroGLYCERIN (NITROLINGUAL) 0.4 MG/SPRAY spray Place 1 spray under the tongue every 5 (five) minutes x 3 doses as needed for chest pain.  . Omega-3 Fatty Acids (FISH OIL) 1200 MG CAPS Take 1 capsule (1,200 mg total) by mouth 2 (two) times daily.  . pantoprazole (PROTONIX) 40 MG tablet Take 40 mg by mouth 2 (two) times daily.  . pravastatin (PRAVACHOL) 80 MG tablet Take 1 tablet (80 mg total) by mouth every evening.  . ranolazine (RANEXA) 500 MG 12 hr tablet Take 1 tablet (500 mg total) by mouth 2 (two) times daily.  Marland Kitchen ULORIC 40 MG tablet Take 40 mg by mouth daily.  . vitamin E 400 UNIT capsule Take 400 Units by mouth daily.  Marland Kitchen zolpidem (AMBIEN) 10 MG tablet Take 10 mg by mouth at bedtime as needed for sleep.  . [DISCONTINUED] hydrALAZINE (APRESOLINE) 50 MG tablet Take 1 tablet (50 mg total) by mouth 3 (three) times daily.  . [DISCONTINUED] hydrALAZINE (APRESOLINE) 50 MG tablet Take 1 tablet (50 mg total) by mouth 3 (three) times daily.  . [DISCONTINUED] pravastatin (PRAVACHOL) 80 MG  tablet Take 1 tablet (80 mg total) by mouth every evening.  . [DISCONTINUED] amLODipine (NORVASC) 10 MG tablet TAKE ONE TABLET BY MOUTH ONCE DAILY   No facility-administered encounter medications on file as of 06/07/2016.     Allergies: Ciprocin-fluocin-procin [fluocinolone acetonide]; Clarithromycin; Cleocin [clindamycin hcl]; and Glimepiride [amaryl]  Social History   Social History  . Marital status: Married    Spouse name: [name]  . Number of children: 5  . Years of education: N/A   Occupational History  . retired     Chartered loss adjuster   Social History Main Topics  . Smoking status: Never Smoker  . Smokeless tobacco: Never Used     Comment: smoked some as a teenager (high school)  . Alcohol use No     Comment: 09/15/2012 "drank a little when I was young"  . Drug use: No  . Sexual activity: Yes   Other Topics Concern  . Not on file   Social History Narrative  Married and lives with wife in [place]. On disability.    Consumes about 2 Coke Zeros a day     Family History  Problem Relation Age of Onset  . Lung cancer Mother   . Anemia Mother   . Polycystic kidney disease Mother   . Diabetes Father   . Heart attack Father 62    Died suddenly  . Heart failure Father   . Hyperlipidemia Father   . Hypertension Father   . Kidney failure Sister 5    Died  . Heart attack Sister   . Hypertension Sister   . Diabetes Sister   . Diabetes Brother   . Polycystic kidney disease Brother   . Diabetes Sister     Review of Systems: Review of Systems  Constitutional: Negative.   HENT: Positive for congestion (took diphenhydramine last night).   Respiratory: Negative.   Cardiovascular: Positive for chest pain (stable chest pain with exertion) and orthopnea. Negative for palpitations, leg swelling and PND.  Gastrointestinal: Negative for blood in stool and melena.  Neurological: Negative for dizziness.    --------------------------------------------------------------------------------------------------  Physical Exam: BP 140/80   Pulse 84   Ht 5' 5.5" (1.664 m)   Wt 233 lb (105.7 kg)   BMI 38.18 kg/m   General:  Well-developed, well nourished man, seated comfortably in the exam room. He is accompanied by his wife.  HEENT: No conjunctival pallor or scleral icterus.  Moist mucous membranes.  OP clear. Neck: Supple without lymphadenopathy, thyromegaly, JVD, or HJR.  No carotid bruit. Lungs: Normal work of breathing.  Clear to auscultation bilaterally without wheezes or crackles. CV: Regular rate and rhythm without murmurs, rubs, or gallops.  Non-displaced PMI. Abd: Bowel sounds present.  Soft, NT/ND without hepatosplenomegaly Ext: No lower extremity edema.  Radial, PT, and DP pulses are 2+ bilaterally. Skin: warm and dry without rash   Lab Results  Component Value Date   WBC 8.2 04/04/2016   HGB 10.8 (L) 04/04/2016   HCT 33.3 (L) 04/04/2016   MCV 87.6 04/04/2016   PLT 212 04/04/2016    Lab Results  Component Value Date   NA 137 04/19/2016   K 4.7 04/19/2016   CL 109 04/19/2016   CO2 18 (L) 04/19/2016   BUN 28 (H) 04/19/2016   CREATININE 2.47 (H) 04/19/2016   GLUCOSE 113 (H) 04/19/2016   ALT 22 03/30/2016    Lab Results  Component Value Date   CHOL 177 03/25/2016   HDL 27 (L) 03/25/2016   LDLCALC 74 03/25/2016   LDLDIRECT 72.0 11/03/2014   TRIG 382 (H) 03/25/2016   CHOLHDL 6.6 03/25/2016    --------------------------------------------------------------------------------------------------  ASSESSMENT AND PLAN: 1. Coronary artery disease with stable angina Mr. Simkin is doing well with his current regimen. He continues to have occasional chest pain with exertion, which responds promptly to sublingual nitroglycerin and rest. Overall, the frequency and intensity of his pain have not changed for several months. We will continue with our current anti-anginal regimen of  isosorbide mononitrate 120 mg daily, ranolazine 500 mg twice a day, carvedilol 25 mg twice a day, and amlodipine 10 mg daily. Mr. Markwood will bring forms from his insurance provider to our office so that we can assist with a tier reduction to make ranolazine more affordable for him.  We will continue with secondary prevention and indefinite dual antiplatelet therapy with aspirin and clopidogrel. Mr. Tierce may benefit from escalation of statin therapy from pravastatin 80 mg daily. However, given financial constraints, we will  defer this until his follow-up visit.  2. Essential hypertension Blood pressure is borderline elevated today. However, Mr. Chatmon reports he has not taken his daily medications today. We will continue with his current medication regimen, which he seems to be tolerating well, and reassess his blood pressure when he returns for follow-up.  3. Hypertensive heart disease without heart failure Patient was noted to have diastolic dysfunction and LVH on prior echocardiogram. He appears euvolemic and well compensated today.  Follow-up: Return to clinic in approximately 3 months.   Nelva Bush, MD 06/07/2016 12:11 PM

## 2016-06-07 NOTE — Patient Instructions (Signed)
Your physician recommends that you continue on your current medications as directed. Please refer to the Current Medication list given to you today. Your physician recommends that you schedule a follow-up appointment in: 3 months with Dr. Saunders Revel.

## 2016-06-19 ENCOUNTER — Telehealth: Payer: Self-pay | Admitting: Internal Medicine

## 2016-06-19 NOTE — Telephone Encounter (Signed)
New Message  Patient calling the office for samples of medication:   1.  What medication and dosage are you requesting samples for? Lunexa  2.  Are you currently out of this medication?  Yes

## 2016-06-19 NOTE — Telephone Encounter (Signed)
Called pt and left message informing him that I was leaving 2 boxes of Ranex 500 mg tablet and a Ranexa Co-pay coupon card at the front desk for pt to pick up and if pt has any other problems, questions or concerns to call the office. Pt verbalized understanding.     Ranexa 500 mg tablet  Lot# [id number]     Exp; 12/2018

## 2016-07-02 ENCOUNTER — Ambulatory Visit: Payer: Commercial Managed Care - HMO | Admitting: Neurology

## 2016-07-11 ENCOUNTER — Telehealth: Payer: Self-pay | Admitting: Internal Medicine

## 2016-07-11 NOTE — Telephone Encounter (Signed)
Left message for Matthew Rowe - last EF 4/16 50-55% Dx of ischemic cardiomyopathy.  Requested she c/b with further questions.

## 2016-07-11 NOTE — Telephone Encounter (Signed)
New Message:   Matthew Rowe would like to know if this pt has a diagnosis of CHF. If so,would like his last EF.

## 2016-07-17 IMAGING — US US RENAL
1 series · 14 of 25 positions shown · non-contrast
Comparison: 11/08/2011

CLINICAL DATA: Acute kidney injury.  Polycystic kidney disease.

EXAM:
RENAL / URINARY TRACT ULTRASOUND COMPLETE

[Series 1: us renal · 0.27mm/px · 14 of 36 slices shown]
[im 1/36]
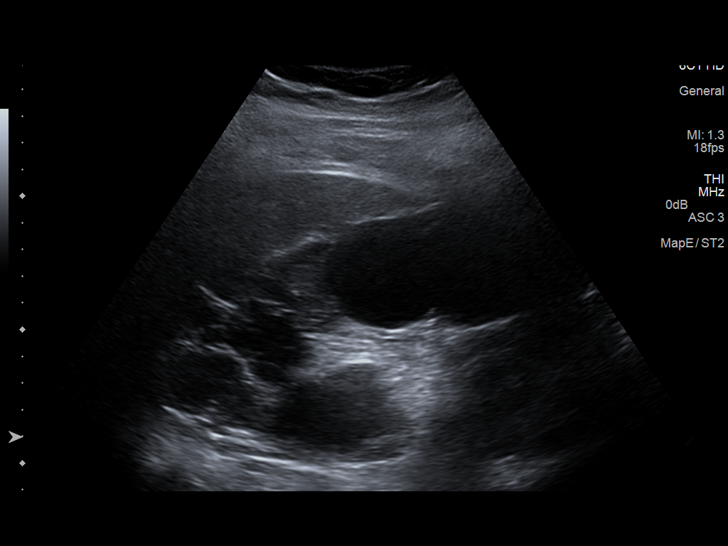
[im 3/36]
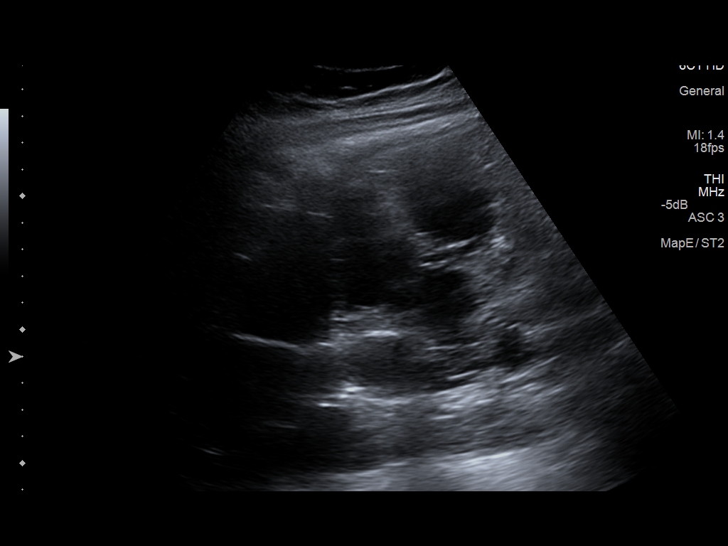
[im 6/36]
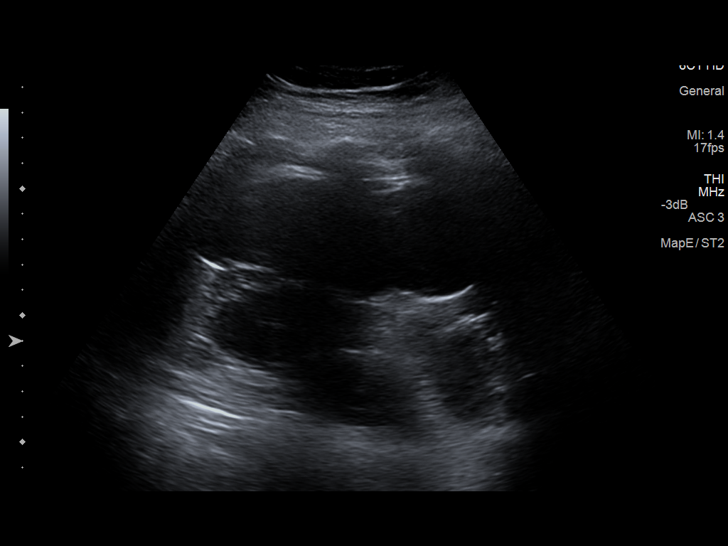
[im 9/36]
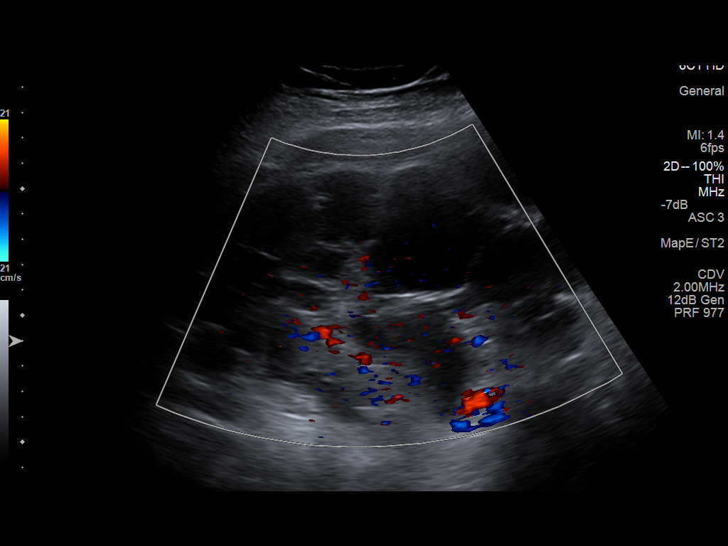
[im 12/36]
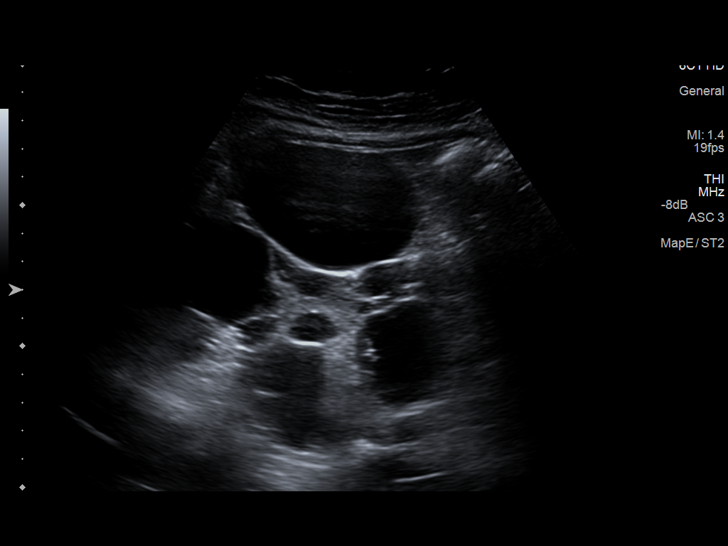
[im 14/36]
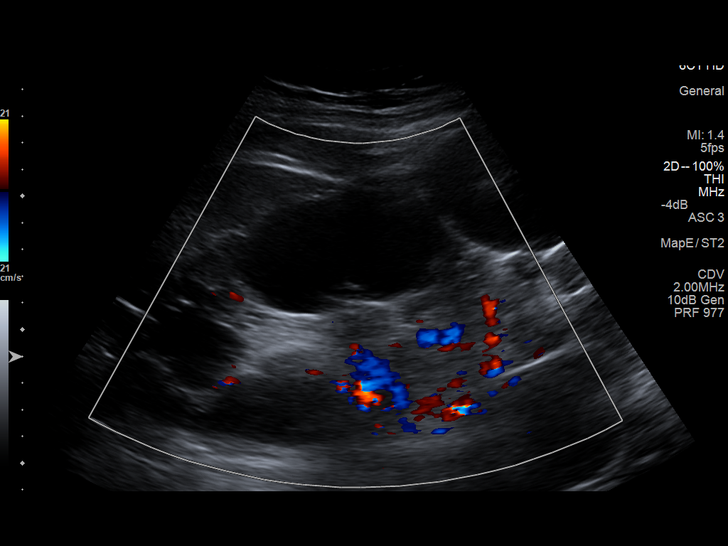
[im 17/36]
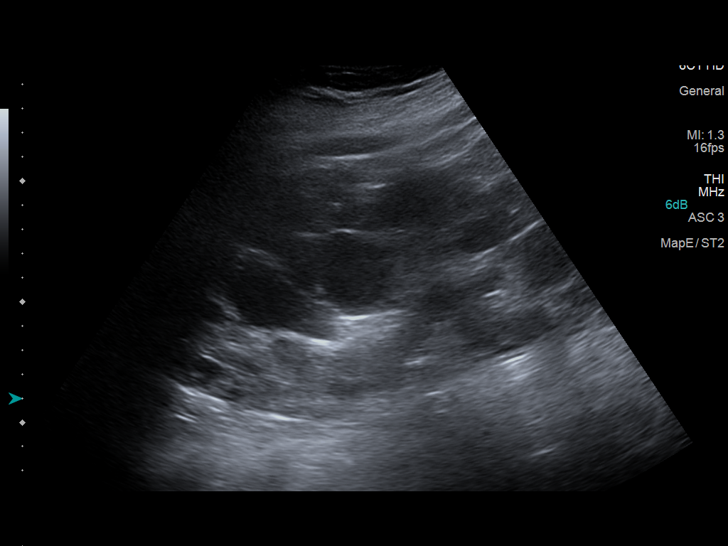
[im 19/36]
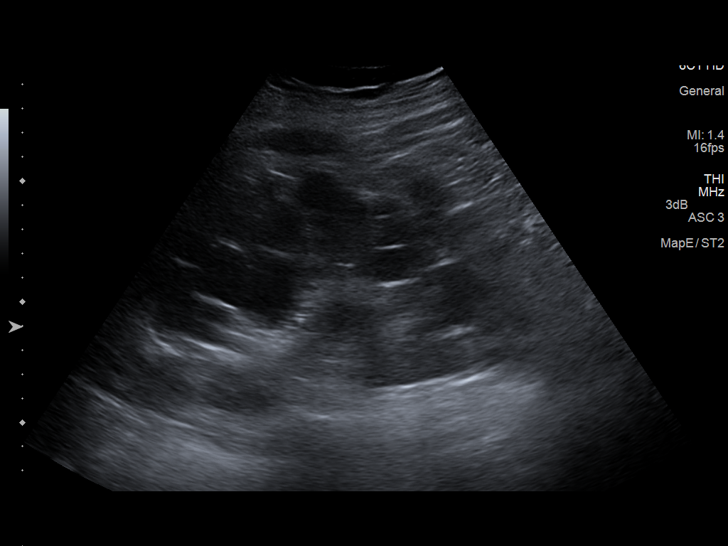
[im 22/36]
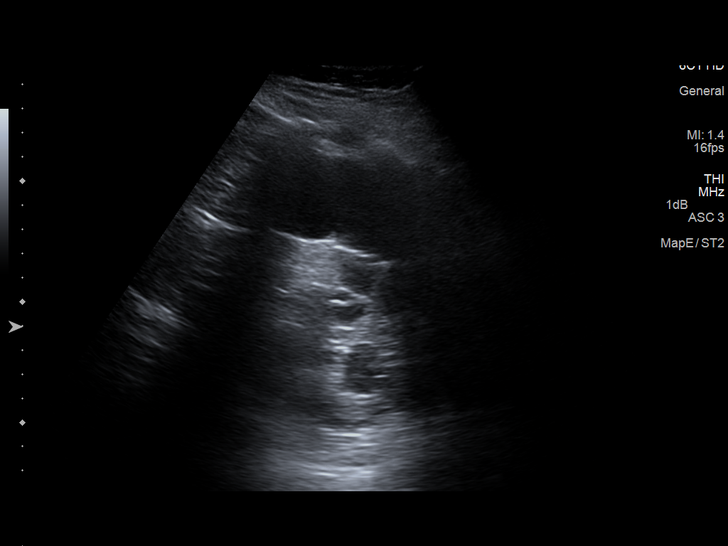
[im 24/36]
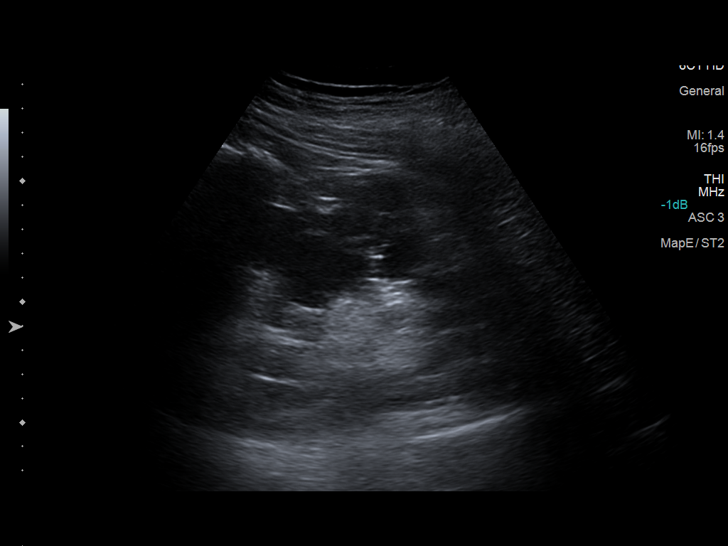
[im 27/36]
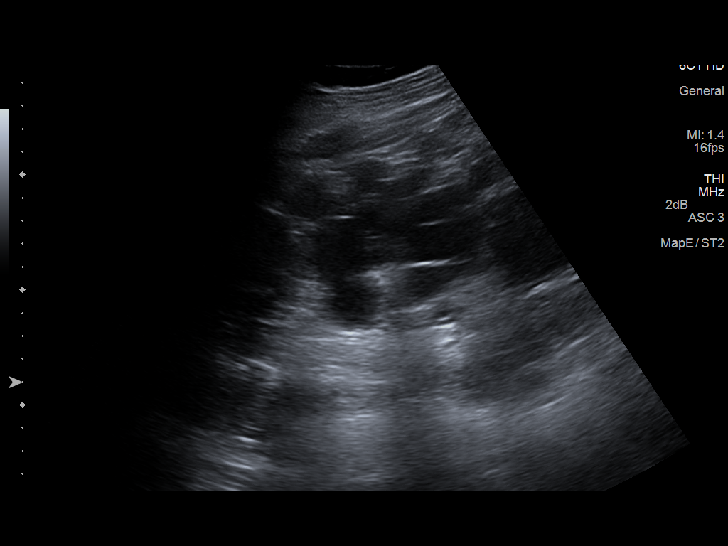
[im 30/36]
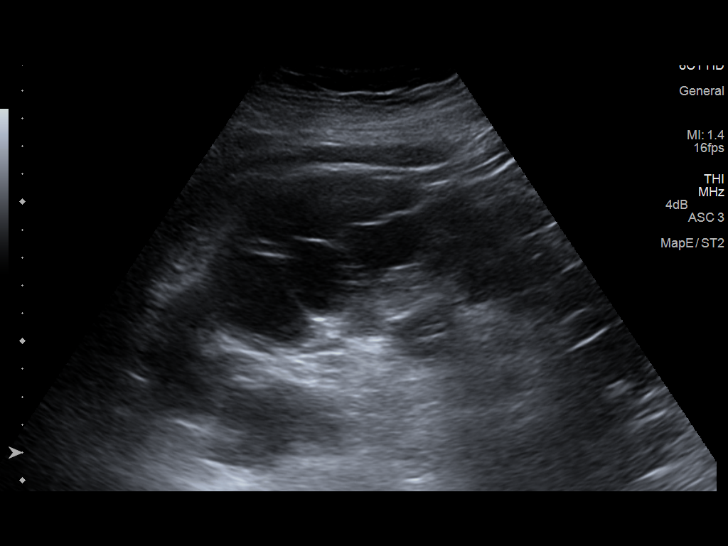
[im 33/36]
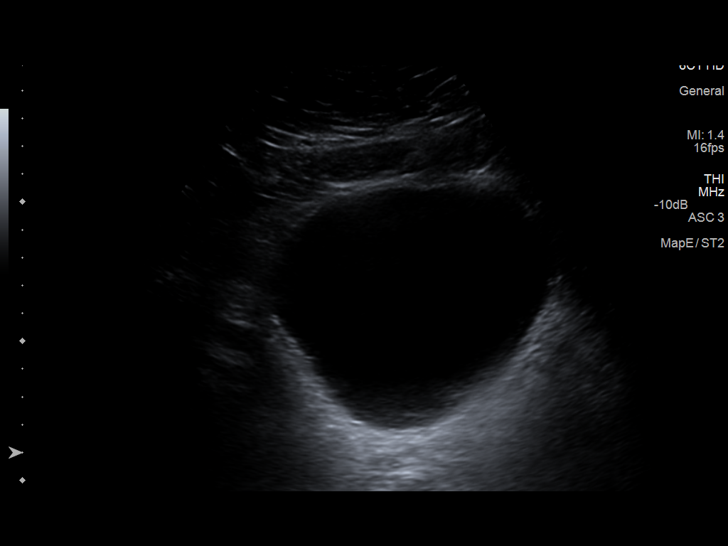
[im 36/36]
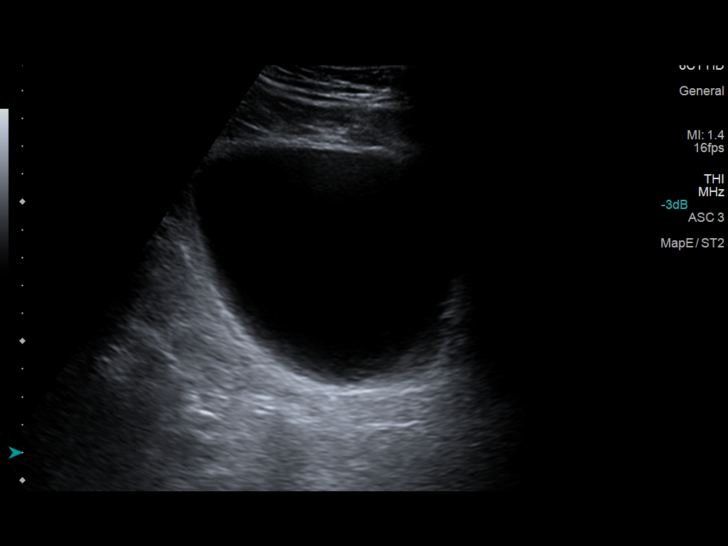

[14 of 25 positions shown; findings below may reference images not displayed]

FINDINGS: Right Kidney:

Length: 21.2 cm. Multiple cysts throughout the left kidney
compatible with polycystic kidney disease, the largest 6.5 cm.
Difficult to visualize normal renal parenchyma. No hydronephrosis.

Left Kidney:

Length: 21.8 cm. Multiple cysts throughout the kidney compatible
with polycystic kidney disease, the largest 5.8 cm. No
hydronephrosis.

Bladder:

Appears normal for degree of bladder distention.
IMPRESSION: Multiple bilateral renal cysts compatible with polycystic kidney
disease.

No hydronephrosis.

## 2016-07-26 ENCOUNTER — Other Ambulatory Visit: Payer: Self-pay | Admitting: Internal Medicine

## 2016-07-26 NOTE — Telephone Encounter (Signed)
Patient calling the office for samples of medication:   1.  What medication and dosage are you requesting samples for? Ranexa   2.  Are you currently out of this medication? Yes

## 2016-07-27 NOTE — Telephone Encounter (Signed)
Pt's wife calling back to fu up on message from yesterday requesting Renexa samples-pls call [phone]

## 2016-07-27 NOTE — Telephone Encounter (Signed)
Left message on VM No samples available at this time.

## 2016-07-27 NOTE — Telephone Encounter (Signed)
Follow Up:    Wife calling again,she is waiting to hear something one way or the other.

## 2016-08-08 ENCOUNTER — Telehealth: Payer: Self-pay | Admitting: Internal Medicine

## 2016-08-08 NOTE — Telephone Encounter (Signed)
Patient and wife aware that we do not have any ranexa samples at this time.

## 2016-08-08 NOTE — Telephone Encounter (Signed)
New Message  Patient calling the office for samples of medication:   1.  What medication and dosage are you requesting samples for? Renexa 500mg    2.  Are you currently out of this medication? Per pt wife. yes

## 2016-08-09 ENCOUNTER — Emergency Department (HOSPITAL_COMMUNITY): Payer: Medicare Other

## 2016-08-09 ENCOUNTER — Encounter (HOSPITAL_COMMUNITY): Payer: Self-pay | Admitting: Vascular Surgery

## 2016-08-09 ENCOUNTER — Observation Stay (HOSPITAL_COMMUNITY)
Admission: EM | Admit: 2016-08-09 | Discharge: 2016-08-10 | Disposition: A | Discharge status: Home / Self Care | Payer: Medicare Other | Attending: Interventional Cardiology | Admitting: Interventional Cardiology

## 2016-08-09 DIAGNOSIS — I252 Old myocardial infarction: Secondary | ICD-10-CM | POA: Insufficient documentation

## 2016-08-09 DIAGNOSIS — G4733 Obstructive sleep apnea (adult) (pediatric): Secondary | ICD-10-CM | POA: Insufficient documentation

## 2016-08-09 DIAGNOSIS — I130 Hypertensive heart and chronic kidney disease with heart failure and stage 1 through stage 4 chronic kidney disease, or unspecified chronic kidney disease: Secondary | ICD-10-CM | POA: Diagnosis not present

## 2016-08-09 DIAGNOSIS — I5032 Chronic diastolic (congestive) heart failure: Secondary | ICD-10-CM | POA: Insufficient documentation

## 2016-08-09 DIAGNOSIS — Z7902 Long term (current) use of antithrombotics/antiplatelets: Secondary | ICD-10-CM | POA: Insufficient documentation

## 2016-08-09 DIAGNOSIS — K219 Gastro-esophageal reflux disease without esophagitis: Secondary | ICD-10-CM | POA: Diagnosis not present

## 2016-08-09 DIAGNOSIS — Z79899 Other long term (current) drug therapy: Secondary | ICD-10-CM | POA: Diagnosis not present

## 2016-08-09 DIAGNOSIS — E785 Hyperlipidemia, unspecified: Secondary | ICD-10-CM | POA: Insufficient documentation

## 2016-08-09 DIAGNOSIS — M109 Gout, unspecified: Secondary | ICD-10-CM | POA: Insufficient documentation

## 2016-08-09 DIAGNOSIS — E1121 Type 2 diabetes mellitus with diabetic nephropathy: Secondary | ICD-10-CM | POA: Diagnosis present

## 2016-08-09 DIAGNOSIS — Z7982 Long term (current) use of aspirin: Secondary | ICD-10-CM | POA: Diagnosis not present

## 2016-08-09 DIAGNOSIS — R079 Chest pain, unspecified: Secondary | ICD-10-CM

## 2016-08-09 DIAGNOSIS — I10 Essential (primary) hypertension: Secondary | ICD-10-CM | POA: Diagnosis not present

## 2016-08-09 DIAGNOSIS — Z951 Presence of aortocoronary bypass graft: Secondary | ICD-10-CM | POA: Insufficient documentation

## 2016-08-09 DIAGNOSIS — E782 Mixed hyperlipidemia: Secondary | ICD-10-CM | POA: Diagnosis not present

## 2016-08-09 DIAGNOSIS — E1159 Type 2 diabetes mellitus with other circulatory complications: Secondary | ICD-10-CM | POA: Diagnosis present

## 2016-08-09 DIAGNOSIS — I2510 Atherosclerotic heart disease of native coronary artery without angina pectoris: Secondary | ICD-10-CM | POA: Diagnosis present

## 2016-08-09 DIAGNOSIS — Z955 Presence of coronary angioplasty implant and graft: Secondary | ICD-10-CM | POA: Insufficient documentation

## 2016-08-09 DIAGNOSIS — N183 Chronic kidney disease, stage 3 (moderate): Secondary | ICD-10-CM | POA: Diagnosis not present

## 2016-08-09 DIAGNOSIS — E1169 Type 2 diabetes mellitus with other specified complication: Secondary | ICD-10-CM | POA: Diagnosis present

## 2016-08-09 DIAGNOSIS — I152 Hypertension secondary to endocrine disorders: Secondary | ICD-10-CM | POA: Diagnosis present

## 2016-08-09 DIAGNOSIS — Z23 Encounter for immunization: Secondary | ICD-10-CM | POA: Diagnosis not present

## 2016-08-09 DIAGNOSIS — E1122 Type 2 diabetes mellitus with diabetic chronic kidney disease: Secondary | ICD-10-CM | POA: Diagnosis not present

## 2016-08-09 DIAGNOSIS — Z9989 Dependence on other enabling machines and devices: Secondary | ICD-10-CM

## 2016-08-09 DIAGNOSIS — I25118 Atherosclerotic heart disease of native coronary artery with other forms of angina pectoris: Secondary | ICD-10-CM | POA: Diagnosis not present

## 2016-08-09 DIAGNOSIS — R072 Precordial pain: Secondary | ICD-10-CM | POA: Diagnosis present

## 2016-08-09 DIAGNOSIS — I25119 Atherosclerotic heart disease of native coronary artery with unspecified angina pectoris: Secondary | ICD-10-CM | POA: Diagnosis not present

## 2016-08-09 DIAGNOSIS — I25110 Atherosclerotic heart disease of native coronary artery with unstable angina pectoris: Secondary | ICD-10-CM | POA: Diagnosis not present

## 2016-08-09 DIAGNOSIS — Z598 Other problems related to housing and economic circumstances: Secondary | ICD-10-CM | POA: Diagnosis not present

## 2016-08-09 DIAGNOSIS — Z7984 Long term (current) use of oral hypoglycemic drugs: Secondary | ICD-10-CM | POA: Diagnosis not present

## 2016-08-09 DIAGNOSIS — Z9861 Coronary angioplasty status: Secondary | ICD-10-CM

## 2016-08-09 DIAGNOSIS — Z599 Problem related to housing and economic circumstances, unspecified: Secondary | ICD-10-CM

## 2016-08-09 DIAGNOSIS — I119 Hypertensive heart disease without heart failure: Secondary | ICD-10-CM | POA: Diagnosis present

## 2016-08-09 DIAGNOSIS — Z9119 Patient's noncompliance with other medical treatment and regimen: Secondary | ICD-10-CM | POA: Diagnosis not present

## 2016-08-09 LAB — CBC
HEMATOCRIT: 31.9 % — AB (ref 39.0–52.0)
Hemoglobin: 10.7 g/dL — ABNORMAL LOW (ref 13.0–17.0)
MCH: 29.3 pg (ref 26.0–34.0)
MCHC: 33.5 g/dL (ref 30.0–36.0)
MCV: 87.4 fL (ref 78.0–100.0)
PLATELETS: 217 10*3/uL (ref 150–400)
RBC: 3.65 MIL/uL — ABNORMAL LOW (ref 4.22–5.81)
RDW: 13.6 % (ref 11.5–15.5)
WBC: 7.9 10*3/uL (ref 4.0–10.5)

## 2016-08-09 LAB — BRAIN NATRIURETIC PEPTIDE: B Natriuretic Peptide: 179.2 pg/mL — ABNORMAL HIGH (ref 0.0–100.0)

## 2016-08-09 LAB — BASIC METABOLIC PANEL
Anion gap: 8 (ref 5–15)
BUN: 29 mg/dL — AB (ref 6–20)
CALCIUM: 9.1 mg/dL (ref 8.9–10.3)
CO2: 19 mmol/L — ABNORMAL LOW (ref 22–32)
CREATININE: 2.29 mg/dL — AB (ref 0.61–1.24)
Chloride: 111 mmol/L (ref 101–111)
GFR calc Af Amer: 35 mL/min — ABNORMAL LOW (ref >60)
GFR, EST NON AFRICAN AMERICAN: 30 mL/min — AB (ref >60)
GLUCOSE: 195 mg/dL — AB (ref 65–99)
Potassium: 5.1 mmol/L (ref 3.5–5.1)
SODIUM: 138 mmol/L (ref 135–145)

## 2016-08-09 LAB — I-STAT TROPONIN, ED: TROPONIN I, POC: 0.01 ng/mL (ref 0.00–0.08)

## 2016-08-09 LAB — TROPONIN I: Troponin I: <0.03 ng/mL (ref <0.03)

## 2016-08-09 LAB — GLUCOSE, CAPILLARY: Glucose-Capillary: 126 mg/dL — ABNORMAL HIGH (ref 65–99)

## 2016-08-09 MED ORDER — HYDROCHLOROTHIAZIDE 25 MG PO TABS
25.0000 mg | ORAL_TABLET | Freq: Every day | ORAL | Status: DC
  Administered 2016-08-09 – 2016-08-10 (×2): 25 mg via ORAL
  Filled 2016-08-09 (×2): qty 1

## 2016-08-09 MED ORDER — ONDANSETRON HCL 4 MG/2ML IJ SOLN: 4.0000 mg | Freq: Four times a day (QID) | INTRAMUSCULAR | Status: DC | PRN

## 2016-08-09 MED ORDER — MORPHINE SULFATE (PF) 2 MG/ML IV SOLN
2.0000 mg | Freq: Once | INTRAVENOUS | Status: AC
  Administered 2016-08-09: 2 mg via INTRAVENOUS
  Filled 2016-08-09: qty 1

## 2016-08-09 MED ORDER — AMLODIPINE BESYLATE 10 MG PO TABS
10.0000 mg | ORAL_TABLET | Freq: Every day | ORAL | Status: DC
  Administered 2016-08-09 – 2016-08-10 (×2): 10 mg via ORAL
  Filled 2016-08-09 (×2): qty 1

## 2016-08-09 MED ORDER — ZOLPIDEM TARTRATE 5 MG PO TABS: 10.0000 mg | ORAL_TABLET | Freq: Every evening | ORAL | Status: DC | PRN

## 2016-08-09 MED ORDER — NITROGLYCERIN 0.4 MG SL SUBL
0.4000 mg | SUBLINGUAL_TABLET | SUBLINGUAL | Status: AC | PRN
  Administered 2016-08-09 (×3): 0.4 mg via SUBLINGUAL
  Filled 2016-08-09: qty 1

## 2016-08-09 MED ORDER — NITROGLYCERIN 0.4 MG SL SUBL
0.4000 mg | SUBLINGUAL_TABLET | SUBLINGUAL | Status: DC | PRN
  Administered 2016-08-09: 0.4 mg via SUBLINGUAL
  Filled 2016-08-09: qty 1

## 2016-08-09 MED ORDER — ISOSORBIDE MONONITRATE ER 60 MG PO TB24
120.0000 mg | ORAL_TABLET | Freq: Every day | ORAL | Status: DC
  Administered 2016-08-09 – 2016-08-10 (×2): 120 mg via ORAL
  Filled 2016-08-09: qty 2
  Filled 2016-08-09: qty 4

## 2016-08-09 MED ORDER — INFLUENZA VAC SPLIT QUAD 0.5 ML IM SUSY
0.5000 mL | PREFILLED_SYRINGE | INTRAMUSCULAR | Status: AC
  Administered 2016-08-10: 0.5 mL via INTRAMUSCULAR
  Filled 2016-08-09: qty 0.5

## 2016-08-09 MED ORDER — ASPIRIN 81 MG PO CHEW
324.0000 mg | CHEWABLE_TABLET | Freq: Once | ORAL | Status: AC
  Administered 2016-08-09: 324 mg via ORAL
  Filled 2016-08-09: qty 4

## 2016-08-09 MED ORDER — HEPARIN SODIUM (PORCINE) 5000 UNIT/ML IJ SOLN
5000.0000 [IU] | Freq: Three times a day (TID) | INTRAMUSCULAR | Status: DC
  Administered 2016-08-09: 5000 [IU] via SUBCUTANEOUS
  Filled 2016-08-09 (×2): qty 1

## 2016-08-09 MED ORDER — MORPHINE SULFATE (PF) 4 MG/ML IV SOLN
4.0000 mg | Freq: Once | INTRAVENOUS | Status: AC
  Administered 2016-08-09: 4 mg via INTRAVENOUS
  Filled 2016-08-09: qty 1

## 2016-08-09 MED ORDER — ASPIRIN 81 MG PO CHEW
81.0000 mg | CHEWABLE_TABLET | Freq: Every day | ORAL | Status: DC
  Administered 2016-08-09 – 2016-08-10 (×2): 81 mg via ORAL
  Filled 2016-08-09 (×2): qty 1

## 2016-08-09 MED ORDER — RANOLAZINE ER 500 MG PO TB12
500.0000 mg | ORAL_TABLET | Freq: Two times a day (BID) | ORAL | Status: DC
  Administered 2016-08-09 – 2016-08-10 (×3): 500 mg via ORAL
  Filled 2016-08-09 (×5): qty 1

## 2016-08-09 MED ORDER — ASPIRIN 81 MG PO CHEW: 324.0000 mg | CHEWABLE_TABLET | ORAL | Status: DC

## 2016-08-09 MED ORDER — HYDRALAZINE HCL 50 MG PO TABS
50.0000 mg | ORAL_TABLET | Freq: Three times a day (TID) | ORAL | Status: DC
  Administered 2016-08-09 – 2016-08-10 (×2): 50 mg via ORAL
  Filled 2016-08-09 (×2): qty 1

## 2016-08-09 MED ORDER — ACETAMINOPHEN 325 MG PO TABS: 650.0000 mg | ORAL_TABLET | ORAL | Status: DC | PRN

## 2016-08-09 MED ORDER — PANTOPRAZOLE SODIUM 40 MG PO TBEC
40.0000 mg | DELAYED_RELEASE_TABLET | Freq: Two times a day (BID) | ORAL | Status: DC
  Administered 2016-08-09 – 2016-08-10 (×2): 40 mg via ORAL
  Filled 2016-08-09 (×2): qty 1

## 2016-08-09 MED ORDER — GABAPENTIN 300 MG PO CAPS
300.0000 mg | ORAL_CAPSULE | Freq: Three times a day (TID) | ORAL | Status: DC
  Administered 2016-08-09 – 2016-08-10 (×2): 300 mg via ORAL
  Filled 2016-08-09 (×2): qty 1

## 2016-08-09 MED ORDER — CLOPIDOGREL BISULFATE 75 MG PO TABS
75.0000 mg | ORAL_TABLET | Freq: Every day | ORAL | Status: DC
  Administered 2016-08-10: 75 mg via ORAL
  Filled 2016-08-09: qty 1

## 2016-08-09 MED ORDER — PRAVASTATIN SODIUM 40 MG PO TABS
80.0000 mg | ORAL_TABLET | Freq: Every evening | ORAL | Status: DC
  Administered 2016-08-09: 80 mg via ORAL
  Filled 2016-08-09: qty 2

## 2016-08-09 MED ORDER — INSULIN ASPART 100 UNIT/ML ~~LOC~~ SOLN
0.0000 [IU] | Freq: Three times a day (TID) | SUBCUTANEOUS | Status: DC
  Administered 2016-08-10: 2 [IU] via SUBCUTANEOUS
  Administered 2016-08-10: 3 [IU] via SUBCUTANEOUS

## 2016-08-09 MED ORDER — CARVEDILOL 25 MG PO TABS
50.0000 mg | ORAL_TABLET | Freq: Two times a day (BID) | ORAL | Status: DC
  Administered 2016-08-09 – 2016-08-10 (×2): 50 mg via ORAL
  Filled 2016-08-09 (×2): qty 2

## 2016-08-09 MED ORDER — ASPIRIN 300 MG RE SUPP: 300.0000 mg | RECTAL | Status: DC

## 2016-08-09 NOTE — ED Notes (Signed)
Cards into see pt diet ordered, pt will be admitted

## 2016-08-09 NOTE — Progress Notes (Signed)
Pt reported having chest pain of 7/10. EKG obtained, in chart. Nitro x3 given with no relief. Cards fellow paged, one time order of morphine 2mg  given, also gave patient's home dose of Ranexa. Pt now rates pain 3/10. MD notified, no new orders given. Will continue to monitor.  Jaymes Graff, RN

## 2016-08-09 NOTE — ED Provider Notes (Signed)
[facility] DEPT Provider Note   CSN: [id number] Arrival date & time: 08/09/16  0844     History   Chief Complaint Chief Complaint  Patient presents with  . Chest Pain    HPI Matthew Rowe. is a 55 y.o. male.  HPI   CP started yesterday, has been continuous, saw Dr. Wilburn Mylar Was a sharp pain radiating into arm, 10/10 now 8/10 Worse with exertion but present at rest Usually taking nitro takes away the pain, but since yesterday it has not, just decreases it a little Similar to prior anginal symptoms  Took nitro last night at midnight, 2AM, 330AM, 4 and 5AM nitro decreased it but didn't take it away  Out of the ranexa for 1 week  Past Medical History:  Diagnosis Date  . Anginal pain ([place])   . CKD (chronic kidney disease), stage III   . Coronary artery disease    a. INF MI 1999: PCI of RCA;  b.  extensive stenting of LAD;  c. s/p CABG with RIMA->RCA in 2006;  d. Stable, Low risk MV 05/2012; e. 09/2012 Cath: stable anatomy->med Rx. f. Abnl nuc 11/2013 -> med rx; g. 11/2013 Cath/PCI: LM 30d, LAD 50ost, 20/50isr, 100d, LCX 40-50ost, OM2 50ost, RI 70-80ost sup branch, RCA 20p, 90/60d(2.25x17 & 2.25x24 Promus DES'), PDA 60p, RIMA->RCA ok.  . DM2 (diabetes mellitus, type 2) ([place])   . Dyslipidemia   . GERD (gastroesophageal reflux disease)   . History of gout   . Hx of echocardiogram    a. Echo 3/12: mild LVH, EF 55-60%, trivial AI  . Hypertensive heart disease   . Myocardial infarction 1999; 2002; 2003; 2006; ~ 2008  . OSA on CPAP   . Polycystic kidney disease     Patient Active Problem List   Diagnosis Date Noted  . CKD (chronic kidney disease)   . Angina at rest [name]) 03/24/2016  . GERD (gastroesophageal reflux disease) 05/08/2015  . Gout 05/08/2015  . Chronic diastolic CHF (congestive heart failure) ([place]) 05/08/2015  . Pain in the chest   . CAD- CABG 2006, RCA DES March 2015   . Hypertensive heart disease   . Unstable angina ([place]) 12/01/2013  . Chest pain  11/27/2013  . Financial difficulty 03/13/2013  . Medically noncompliant 03/13/2013  . Type 2 diabetes mellitus with renal manifestations ([place])   . Obesity-BMI 42 11/27/2012  . Acute renal failure superimposed on stage 3 chronic kidney disease ([place]) 09/15/2012  . Polycystic kidney disease 09/15/2012  . OSA on CPAP 09/15/2012  . Essential hypertension 03/27/2011  . Dyslipidemia     Past Surgical History:  Procedure Laterality Date  . CARDIAC CATHETERIZATION  2012  . CARDIAC CATHETERIZATION  2013  . CARDIAC CATHETERIZATION  2014   LHC (1/14): total occlusion of distal LAD, diffuse disease in ramus, 70% proximal LCx, 50% proximal and mid RCA, 50% distal RCA, patent RIMA-RCA.  Marland Kitchen CORONARY ANGIOPLASTY WITH STENT PLACEMENT  1999 / 2002 / 2004 / 2006?  . CORONARY ARTERY BYPASS GRAFT  2006   CABG X?; in [place]  . CYSTECTOMY  1990's   off face  . LEFT HEART CATHETERIZATION WITH CORONARY/GRAFT ANGIOGRAM N/A 09/16/2012   Procedure: LEFT HEART CATHETERIZATION WITH [name];  Surgeon: [name], MD;  Location: [facility] CATH LAB;  Service: Cardiovascular;  Laterality: N/A;  . LEFT HEART CATHETERIZATION WITH CORONARY/GRAFT ANGIOGRAM N/A 12/02/2013   Procedure: LEFT HEART CATHETERIZATION WITH [name];  Surgeon: [name], MD;  Location: [facility] CATH LAB;  Service: Cardiovascular;  Laterality: N/A;  . PERCUTANEOUS CORONARY STENT INTERVENTION (PCI-S) N/A 12/03/2013   Procedure: PERCUTANEOUS CORONARY STENT INTERVENTION (PCI-S);  Surgeon: [name], MD;  Location: [facility] CATH LAB;  Service: Cardiovascular;  Laterality: N/A;       Home Medications    Prior to Admission medications   Medication Sig Start Date End Date Taking? Authorizing Provider  acetaminophen (TYLENOL) 500 MG tablet Take 1,000 mg by mouth every 8 (eight) hours as needed for mild pain.    Yes Historical Provider, MD  amLODipine (NORVASC) 10 MG tablet Take 1 tablet (10 mg total) by mouth daily.  01/12/15  Yes Gay Filler Copland, MD  aspirin 81 MG tablet Take 1 tablet (81 mg total) by mouth daily. 12/04/13  Yes Rogelia Mire, NP  carvedilol (COREG) 25 MG tablet TAKE TWO TABLETS BY MOUTH TWICE DAILY 01/25/16  Yes Larey Dresser, MD  clopidogrel (PLAVIX) 75 MG tablet Take 1 tablet (75 mg total) by mouth daily. With breakfast. 01/23/16  Yes Larey Dresser, MD  diphenhydrAMINE (BENADRYL) 25 mg capsule Take 25 mg by mouth daily as needed for allergies.   Yes Historical Provider, MD  gabapentin (NEURONTIN) 300 MG capsule Take 300 mg by mouth 3 (three) times daily.   Yes Historical Provider, MD  hydrALAZINE (APRESOLINE) 50 MG tablet Take 1 tablet (50 mg total) by mouth 3 (three) times daily. 06/07/16  Yes Christopher End, MD  hydrochlorothiazide (HYDRODIURIL) 25 MG tablet Take 25 mg by mouth daily. 05/09/16  Yes Historical Provider, MD  isosorbide mononitrate (IMDUR) 120 MG 24 hr tablet Take 1 tablet (120 mg total) by mouth daily. 04/11/16  Yes Isaiah Serge, NP  metFORMIN (GLUCOPHAGE) 1000 MG tablet Take 1,000 mg by mouth 2 (two) times daily with a meal.   Yes Historical Provider, MD  Multiple Vitamin (MULTIVITAMIN WITH MINERALS) TABS tablet Take 1 tablet by mouth daily.   Yes Historical Provider, MD  Omega-3 Fatty Acids (FISH OIL) 1200 MG CAPS Take 1 capsule (1,200 mg total) by mouth 2 (two) times daily. 02/03/15  Yes Larey Dresser, MD  pantoprazole (PROTONIX) 40 MG tablet Take 40 mg by mouth 2 (two) times daily.   Yes Historical Provider, MD  pravastatin (PRAVACHOL) 80 MG tablet Take 1 tablet (80 mg total) by mouth every evening. 06/07/16  Yes Nelva Bush, MD  ranolazine (RANEXA) 500 MG 12 hr tablet Take 1 tablet (500 mg total) by mouth 2 (two) times daily. 12/14/15  Yes Larey Dresser, MD  ULORIC 40 MG tablet Take 40 mg by mouth daily. 05/08/16  Yes Historical Provider, MD  vitamin E 400 UNIT capsule Take 400 Units by mouth daily.   Yes Historical Provider, MD  zolpidem (AMBIEN) 10 MG tablet  Take 10 mg by mouth at bedtime as needed for sleep.   Yes Historical Provider, MD  nitroGLYCERIN (NITROLINGUAL) 0.4 MG/SPRAY spray Place 1 spray under the tongue every 5 (five) minutes x 3 doses as needed for chest pain. 01/23/16   Larey Dresser, MD    Family History Family History  Problem Relation Age of Onset  . Lung cancer Mother   . Anemia Mother   . Polycystic kidney disease Mother   . Diabetes Father   . Heart attack Father 95    Died suddenly  . Heart failure Father   . Hyperlipidemia Father   . Hypertension Father   . Kidney failure Sister 5    Died  . Heart attack Sister   .  Hypertension Sister   . Diabetes Sister   . Diabetes Brother   . Polycystic kidney disease Brother   . Diabetes Sister     Social History Social History  Substance Use Topics  . Smoking status: Never Smoker  . Smokeless tobacco: Never Used     Comment: smoked some as a teenager (high school)  . Alcohol use No     Comment: 09/15/2012 "drank a little when I was young"     Allergies   Ciprocin-fluocin-procin [fluocinolone acetonide]; Clarithromycin; Cleocin [clindamycin hcl]; and Glimepiride [amaryl]   Review of Systems Review of Systems  Constitutional: Negative for fever.  HENT: Negative for sore throat.   Eyes: Negative for visual disturbance.  Respiratory: Negative for shortness of breath.   Cardiovascular: Positive for chest pain.  Gastrointestinal: Positive for diarrhea (metformin). Negative for abdominal pain, nausea and vomiting.  Genitourinary: Negative for difficulty urinating.  Musculoskeletal: Negative for back pain and neck stiffness.  Skin: Negative for rash.  Neurological: Positive for light-headedness. Negative for syncope and headaches.     Physical Exam Updated Vital Signs BP 113/84   Pulse 75   Temp 98 F (36.7 C) (Oral)   Resp 21   SpO2 100%   Physical Exam  Constitutional: He is oriented to person, place, and time. He appears well-developed and  well-nourished. No distress.  HENT:  Head: Normocephalic and atraumatic.  Eyes: Conjunctivae and EOM are normal.  Neck: Normal range of motion.  Cardiovascular: Normal rate, regular rhythm, normal heart sounds and intact distal pulses.  Exam reveals no gallop and no friction rub.   No murmur heard. Pulmonary/Chest: Effort normal and breath sounds normal. No respiratory distress. He has no wheezes. He has no rales.  Abdominal: Soft. He exhibits no distension. There is no tenderness. There is no guarding.  Musculoskeletal: He exhibits no edema.  Neurological: He is alert and oriented to person, place, and time.  Skin: Skin is warm and dry. He is not diaphoretic.  Nursing note and vitals reviewed.    ED Treatments / Results  Labs (all labs ordered are listed, but only abnormal results are displayed) Labs Reviewed  BASIC METABOLIC PANEL - Abnormal; Notable for the following:       Result Value   CO2 19 (*)    Glucose, Bld 195 (*)    BUN 29 (*)    Creatinine, Ser 2.29 (*)    GFR calc non Af Amer 30 (*)    GFR calc Af Amer 35 (*)    All other components within normal limits  CBC - Abnormal; Notable for the following:    RBC 3.65 (*)    Hemoglobin 10.7 (*)    HCT 31.9 (*)    All other components within normal limits  BRAIN NATRIURETIC PEPTIDE - Abnormal; Notable for the following:    B Natriuretic Peptide 179.2 (*)    All other components within normal limits  TROPONIN I  TROPONIN I  TROPONIN I  I-STAT TROPOININ, ED    EKG  EKG Interpretation  Date/Time:  Thursday August 09 2016 08:49:59 EST Ventricular Rate:  87 PR Interval:  146 QRS Duration: 104 QT Interval:  354 QTC Calculation: 425 R Axis:   -59 Text Interpretation:  Normal sinus rhythm Possible Left atrial enlargement Left axis deviation Inferior infarct , age undetermined Anterolateral infarct , age undetermined Abnormal ECG No significant change since last tracing Confirmed by [facility] MD, Junie Panning ([id number]) on  08/09/2016 9:15:55 AM Also confirmed by Billy Fischer MD,  Natalynn Pedone ([id number]), editor Yehuda Mao [phone])  on 08/09/2016 11:18:39 AM       Radiology Dg Chest 2 View  Result Date: 08/09/2016 CLINICAL DATA:  Left-sided DIS sharp stabbing chest pain associated with arm pain and dizziness. Symptoms unresponsive to a oral nitroglycerin history of CABG in 2006 with coronary stent placement in March of 2015 EXAM: CHEST  2 VIEW COMPARISON:  PA and lateral chest x-ray of April 04, 2016 FINDINGS: The lungs are mildly hypoinflated but clear. There is no pneumothorax, pneumomediastinum, or pleural effusion. The heart is top-normal in size. The pulmonary vascularity is normal. The sternal wires are intact with exception of the wire fourth from the top which is chronically broken. The observed bony thorax exhibits no acute abnormality. IMPRESSION: Mild hypo inflation.  No acute cardiopulmonary abnormality. Electronically Signed   By: David  Martinique M.D.   On: 08/09/2016 09:24    Procedures Procedures (including critical care time)  Medications Ordered in ED Medications  nitroGLYCERIN (NITROSTAT) SL tablet 0.4 mg (0.4 mg Sublingual Given 08/09/16 1023)  isosorbide mononitrate (IMDUR) 24 hr tablet 120 mg (120 mg Oral Given 08/09/16 1547)  ranolazine (RANEXA) 12 hr tablet 500 mg (500 mg Oral Given 08/09/16 1810)  aspirin chewable tablet 324 mg (324 mg Oral Given 08/09/16 1023)  morphine 4 MG/ML injection 4 mg (4 mg Intravenous Given 08/09/16 1201)     Initial Impression / Assessment and Plan / ED Course  I have reviewed the triage vital signs and the nursing notes.  Pertinent labs & imaging results that were available during my care of the patient were reviewed by me and considered in my medical decision making (see chart for details).  Clinical Course     55 year old male with a history of coronary artery disease with hx of CABG, multiple stents, most recent cath 2015, hypertension, dyslipidemia, diabetes,  GERD, CKD stage III who presents with concern for chest pain. No dyspnea, no hypoxia, no tachypnea, low suspicion for PE. Normal bilateral pulses, normal mediastinum on XR, low suspicion for dissection.  EKG without changes. Troponin negative. Patient states symptoms are consistent with prior anginal symptoms. Given escalating chest pain, not completely relieved by nitroglycerin, worse with exertion but present at rest, have concern for unstable angina. Patient last catheterization was in 2015.  His unstable angina is also in setting of being out of his Ranexa. Consult cardiology for further care. Given 324 of aspirin. Cardiology admitting.   Final Clinical Impressions(s) / ED Diagnoses   Final diagnoses:  Coronary artery disease involving native heart with unstable angina pectoris, unspecified vessel or lesion type [facility])  Chest pain, unspecified type    New Prescriptions New Prescriptions   No medications on file     Gareth Morgan, MD 08/09/16 [id number]

## 2016-08-09 NOTE — H&P (Signed)
History & Physical    Patient ID: Matthew BOEDING Sr. MRN: [id number], DOB/AGE: 12/07/1960   Admit date: 08/09/2016  Primary Physician: Barrie Lyme, FNP Primary Cardiologist: Dr. McLean>>Dr. 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   Past Medical History    Past Medical History:  Diagnosis Date  . Anginal pain ([place])   . CKD (chronic kidney disease), stage III   . Coronary artery disease    a. INF MI 1999: PCI of RCA;  b.  extensive stenting of LAD;  c. s/p CABG with RIMA->RCA in 2006;  d. Stable, Low risk MV 05/2012; e. 09/2012 Cath: stable anatomy->med Rx. f. Abnl nuc 11/2013 -> med rx; g. 11/2013 Cath/PCI: LM 30d, LAD 50ost, 20/50isr, 100d, LCX 40-50ost, OM2 50ost, RI 70-80ost sup branch, RCA 20p, 90/60d(2.25x17 & 2.25x24 Promus DES'), PDA 60p, RIMA->RCA ok.  . DM2 (diabetes mellitus, type 2) ([place])   . Dyslipidemia   . GERD (gastroesophageal reflux disease)   . History of gout   . Hx of echocardiogram    a. Echo 3/12: mild LVH, EF 55-60%, trivial AI  . Hypertensive heart disease   . Myocardial infarction 1999; 2002; 2003; 2006; ~ 2008  . OSA on CPAP   . Polycystic kidney disease     Past Surgical History:  Procedure Laterality Date  . CARDIAC CATHETERIZATION  2012  . CARDIAC CATHETERIZATION  2013  . CARDIAC CATHETERIZATION  2014   LHC (1/14): total occlusion of distal LAD, diffuse disease in ramus, 70% proximal LCx, 50% proximal and mid RCA, 50% distal RCA, patent RIMA-RCA.  Marland Kitchen CORONARY ANGIOPLASTY WITH STENT PLACEMENT  1999 / 2002 / 2004 / 2006?  . CORONARY ARTERY BYPASS GRAFT  2006   CABG X?; in [place]  . CYSTECTOMY  1990's   off face  . LEFT HEART CATHETERIZATION WITH CORONARY/GRAFT ANGIOGRAM N/A  09/16/2012   Procedure: LEFT HEART CATHETERIZATION WITH [name];  Surgeon: [name], MD;  Location: [facility] CATH LAB;  Service: Cardiovascular;  Laterality: N/A;  . LEFT HEART CATHETERIZATION WITH CORONARY/GRAFT ANGIOGRAM N/A 12/02/2013   Procedure: LEFT HEART CATHETERIZATION WITH [name];  Surgeon: [name], MD;  Location: [facility] CATH LAB;  Service: Cardiovascular;  Laterality: N/A;  . PERCUTANEOUS CORONARY STENT INTERVENTION (PCI-S) N/A 12/03/2013   Procedure: PERCUTANEOUS CORONARY STENT INTERVENTION (PCI-S);  Surgeon: [name], MD;  Location: [facility] CATH LAB;  Service: Cardiovascular;  Laterality: N/A;     Allergies  Allergies  Allergen Reactions  . Ciprocin-Fluocin-Procin [Fluocinolone Acetonide] Rash  . Clarithromycin Itching and Other (See Comments)    "Biaxin" Eyes itch and burn  . Cleocin [Clindamycin Hcl] Anaphylaxis and Swelling  . 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 He was previously a patient of Dr. Aundra Dubin but has been transitioned to Dr. Saunders Revel. He was last seen in the office on 06/07/16. Was noted that his hydralazine was increased at previous  visit, and blood pressure was better controlled. Stated he continued to have angina when he "over does himself", but had not changed from his usual angina. Did report a 20lb weight loss and had improved his overall health. Remained on Imdur 120mg  daily, along with Ranexa 500mg  BID. He currently relies on samples from office he has been unable to financially afford the medication. His medications were continued without change.   Last cath was 3/15 with DESx2 placed to overlapping stents in the RCA. Echo 4/16 showed EF  50-55%, G1DD.   Reports he has been in his usual state of health until yesterday. Developed central chest pain with mild dyspnea early morning. The pain was intermittent during the day, but never fully resolved. States he took nitro without relief. Was seen by his PCP, and instructed that if symptoms persist to be evaluated. Tells me he ran out of his Ranexa and Gabapentin a week ago. Called the office for samples but there were none available. Seems his angina prior to yesterday has been stable, and controlled by his medications, including Ranexa.   In the ED his labs showed stable electrolytes, Cr 2.29 (baseline 2.4), Hgb 10.7, Trop neg x1. CXR with edema or acute process. EKG showed SR with TWI in leads aVR, aVL noted on previous tracings. He was given SL nitro in the ED without much relief.   Home Medications    Prior to Admission medications   Medication Sig Start Date End Date Taking? Authorizing Provider  acetaminophen (TYLENOL) 500 MG tablet Take 1,000 mg by mouth every 8 (eight) hours as needed for mild pain.     Historical Provider, MD  amLODipine (NORVASC) 10 MG tablet Take 1 tablet (10 mg total) by mouth daily. 01/12/15   Darreld Mclean, MD  aspirin 81 MG tablet Take 1 tablet (81 mg total) by mouth daily. 12/04/13   Rogelia Mire, NP  carvedilol (COREG) 25 MG tablet TAKE TWO TABLETS BY MOUTH TWICE DAILY 01/25/16   Larey Dresser, MD  clopidogrel (PLAVIX) 75 MG tablet Take 1 tablet (75 mg total) by mouth daily. With breakfast. 01/23/16   Larey Dresser, MD  diphenhydrAMINE (BENADRYL) 25 mg capsule Take 25 mg by mouth daily as needed for allergies.    Historical Provider, MD  gabapentin (NEURONTIN) 300 MG capsule Take 300 mg by mouth 3 (three) times daily.    Historical Provider, MD  hydrALAZINE (APRESOLINE) 50 MG tablet Take 1 tablet (50 mg total) by mouth 3 (three) times daily. 06/07/16   Nelva Bush, MD  hydrochlorothiazide (HYDRODIURIL) 25 MG tablet Take 25 mg by mouth daily.  05/09/16   Historical Provider, MD  isosorbide mononitrate (IMDUR) 120 MG 24 hr tablet Take 1 tablet (120 mg total) by mouth daily. 04/11/16   Isaiah Serge, NP  metFORMIN (GLUCOPHAGE) 1000 MG tablet Take 1,000 mg by mouth 2 (two) times daily with a meal.    Historical Provider, MD  Multiple Vitamin (MULTIVITAMIN WITH MINERALS) TABS tablet Take 1 tablet by mouth daily.    Historical Provider, MD  nitroGLYCERIN (NITROLINGUAL) 0.4 MG/SPRAY spray Place 1 spray under the tongue every 5 (five) minutes x 3 doses as needed for chest pain. 01/23/16   Larey Dresser, MD  Omega-3 Fatty Acids (FISH OIL) 1200 MG CAPS Take 1 capsule (1,200 mg total) by mouth 2 (two) times daily. 02/03/15   Larey Dresser, MD  pantoprazole (PROTONIX) 40 MG tablet Take 40 mg by mouth 2 (two) times daily.  Historical Provider, MD  pravastatin (PRAVACHOL) 80 MG tablet Take 1 tablet (80 mg total) by mouth every evening. 06/07/16   Nelva Bush, MD  ranolazine (RANEXA) 500 MG 12 hr tablet Take 1 tablet (500 mg total) by mouth 2 (two) times daily. 12/14/15   Larey Dresser, MD  ULORIC 40 MG tablet Take 40 mg by mouth daily. 05/08/16   Historical Provider, MD  vitamin E 400 UNIT capsule Take 400 Units by mouth daily.    Historical Provider, MD  zolpidem (AMBIEN) 10 MG tablet Take 10 mg by mouth at bedtime as needed for sleep.    Historical Provider, MD    Family History    Family History  Problem Relation Age of Onset  . Lung cancer Mother   . Anemia Mother   . Polycystic kidney disease Mother   . Diabetes Father   . Heart attack Father 59    Died suddenly  . Heart failure Father   . Hyperlipidemia Father   . Hypertension Father   . Kidney failure Sister 5    Died  . Heart attack Sister   . Hypertension Sister   . Diabetes Sister   . Diabetes Brother   . Polycystic kidney disease Brother   . Diabetes Sister     Social History    Social History   Social History  . Marital status: Married    Spouse name: [name]    . Number of children: 5  . Years of education: N/A   Occupational History  . retired     Chartered loss adjuster   Social History Main Topics  . Smoking status: Never Smoker  . Smokeless tobacco: Never Used     Comment: smoked some as a teenager (high school)  . Alcohol use No     Comment: 09/15/2012 "drank a little when I was young"  . Drug use: No  . Sexual activity: Yes   Other Topics Concern  . Not on file   Social History Narrative   Married and lives with wife in [place]. On disability.    Consumes about 2 Coke Zeros a day      Review of Systems    General:  No chills, fever, night sweats or weight changes.  Cardiovascular:  See HPI Dermatological: No rash, lesions/masses Respiratory: No cough, dyspnea Urologic: No hematuria, dysuria Abdominal:   No nausea, vomiting, diarrhea, bright red blood per rectum, melena, or hematemesis Neurologic:  No visual changes, wkns, changes in mental status. All other systems reviewed and are otherwise negative except as noted above.  Physical Exam    Blood pressure 140/69, pulse 86, temperature 98 F (36.7 C), temperature source Oral, resp. rate 18, SpO2 100 %.  General: Pleasant AA male, NAD Psych: Normal affect. Neuro: Alert and oriented X 3. Moves all extremities spontaneously. HEENT: Normal  Neck: Supple without bruits or JVD. Lungs:  Resp regular and unlabored, CTA. Heart: RRR no s3, s4, or murmurs. Abdomen: Soft, non-tender, non-distended, BS + x 4.  Extremities: No clubbing, cyanosis or edema. DP/PT/Radials 2+ and equal bilaterally.  Labs    Troponin [facility] of Care Test)  Recent Labs  08/09/16 0922  TROPIPOC 0.01   No results for input(s): CKTOTAL, CKMB, TROPONINI in the last 72 hours. Lab Results  Component Value Date   WBC 7.9 08/09/2016   HGB 10.7 (L) 08/09/2016   HCT 31.9 (L) 08/09/2016   MCV 87.4 08/09/2016   PLT 217 08/09/2016    Recent Labs Lab 08/09/16  0908  NA 138  K 5.1  CL 111  CO2 19*  BUN 29*   CREATININE 2.29*  CALCIUM 9.1  GLUCOSE 195*   Lab Results  Component Value Date   CHOL 177 03/25/2016   HDL 27 (L) 03/25/2016   LDLCALC 74 03/25/2016   TRIG 382 (H) 03/25/2016   Lab Results  Component Value Date   DDIMER  12/01/2010    <0.22        AT THE INHOUSE ESTABLISHED CUTOFF VALUE OF 0.48 ug/mL FEU, THIS ASSAY HAS BEEN DOCUMENTED IN THE LITERATURE TO HAVE A SENSITIVITY AND NEGATIVE PREDICTIVE VALUE OF AT LEAST 98 TO 99%.  THE TEST RESULT SHOULD BE CORRELATED WITH AN ASSESSMENT OF THE CLINICAL PROBABILITY OF DVT / VTE.     Radiology Studies    Dg Chest 2 View  Result Date: 08/09/2016 CLINICAL DATA:  Left-sided DIS sharp stabbing chest pain associated with arm pain and dizziness. Symptoms unresponsive to a oral nitroglycerin history of CABG in 2006 with coronary stent placement in March of 2015 EXAM: CHEST  2 VIEW COMPARISON:  PA and lateral chest x-ray of April 04, 2016 FINDINGS: The lungs are mildly hypoinflated but clear. There is no pneumothorax, pneumomediastinum, or pleural effusion. The heart is top-normal in size. The pulmonary vascularity is normal. The sternal wires are intact with exception of the wire fourth from the top which is chronically broken. The observed bony thorax exhibits no acute abnormality. IMPRESSION: Mild hypo inflation.  No acute cardiopulmonary abnormality. Electronically Signed   By: David  Martinique M.D.   On: 08/09/2016 09:24    ECG & Cardiac Imaging    EKG: SR with aVR, aVL  Echo: 4/16  Study Conclusions  - Left ventricle: The cavity size was normal. Wall thickness was   increased in a pattern of mild LVH. Systolic function was normal.   The estimated ejection fraction was in the range of 50% to 55%.   Wall motion was normal; there were no regional wall motion   abnormalities. Doppler parameters are consistent with abnormal   left ventricular relaxation (grade 1 diastolic dysfunction). - Aortic valve: There was very mild stenosis.  There was mild   regurgitation. Valve area (VTI): 1.7 cm^2. Valve area (Vmax):   1.61 cm^2. Valve area (Vmean): 1.54 cm^2. - Mitral valve: Calcified annulus. Mildly thickened leaflets .   There was mild regurgitation.  Cath: 12/03/13  ANGIOGRAPHIC DATA:     The right coronary artery has an anomalous takeoff from the left cusp. The mid to distal vessel is diffusely diseased. There is a focal 95% stenosis in the mid vessel. The distal vessel also has a. 80% stenosis. There is diffuse disease.  PCI NARRATIVE: A CLS 3.5 guiding catheter was used to engage the RCA. A pro-water wire was placed into the RCA. A 2.0 x 15 balloon was used to predilate both the distal and the mid lesion. A 2.25 x 16 promise drug-eluting stent was placed in the more proximal lesion. This was inflated to 16 atmospheres. Post dilated with a 2.5 x 12 noncompliant balloon to 16 atmospheres. Further angiography showed the distal edge of the stent landed in a heavily diseased area.  We Decided to advance a 2.25 x 24 promise drug-eluting stent to the distal RCA. This was deployed in overlapping fashion to the trifurcation of the PDA and posterior lateral artery. The stent balloon was used to post dilate the entire stented segment, to greater than 2.5 mm in diameter. There is  an excellent angiographic result. There is still diffuse disease in the terminal vessels of the RCA system.  IMPRESSIONS:  1. Successful PCI of the mid to distal right coronary artery with overlapping promus drug-eluting stents, 2.25 x 24 and 2.25 x 16, postdilated to greater than 2.5 mm in diameter. 2.  LVEDP 26 mmHg. elevated LVEDP, likely related to aggressive hydration post catheterization.  Watch for post procedure shortness of breath again tomorrow since we will hydrate him aggressively after this dye load. 3.   AL2 or CLS 3.5 Guide is suitable for engaging the RCA.  CLS 3.5 was used today since we only had AL2 with sideholes in the cath lab.     RECOMMENDATION:  Continue dual antiplatelet therapy for at least a year. He'll need aggressive secondary prevention.  F/u with Dr. Aundra Dubin.   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   1. Chest pain: States this began yesterday morning that has been continuous for the past 36 hours. Recently ran out of his Ranexa and gabapentin about a week ago. Seems his symptoms generally remained controlled while he is on a good medication regimen. Trop neg x1, EKG is nonacute. Still c/o chest pain at the time of assessment.  -- Will admit to telemetry -- cycle enzymes -- resume ranexa, give a dose of morphine for pain now -- would have high threshold for repeat cath given his known kidney disease -- consult CM regarding Ranexa  2. DM: SSI, last A1c 6.9  3. HTN: controlled on current medications  4. HLD: on statin therapy  5. CKD: Cr 2.29 currently, baseline seems around 2.4  Signed, Reino Bellis, NP-C Pager [phone] 08/09/2016, 10:57 AM   I have examined the patient and reviewed assessment and plan and discussed with patient.  Agree with above as stated.  R/o for MI with enzymes.  No plans for cath unless he has positive troponin or ECG changes.  Higher threshold for him due to chronic renal insufficieny. Need to find a way for him to get his Ranexa.  This medicines seems to work very well to relieve angina for him.   Larae Grooms

## 2016-08-09 NOTE — ED Notes (Signed)
Report attempted, RN to call back to ED for report.

## 2016-08-09 NOTE — ED Notes (Signed)
Requested meds from pharmacy  

## 2016-08-09 NOTE — ED Notes (Signed)
Pt c/o cp left side

## 2016-08-09 NOTE — ED Notes (Signed)
Chest pain since last night took a nitro at12 mn, 2 am , 330 am, 4 and at 5 am then took a hydrocodone pain eased some but did not go away, states saw dr yesterday as he has been out of some of his meds and the ranexa has been to expensive and he has been getting samples from the dr but they have been out x 2 weeks. Did get his gabpentin filled. Pt has had a hard time cathching his breath no vomitin some nausea and diarrhea, but pt thought it was from his metformain. Past hx of Cabg in 06 in [place]

## 2016-08-09 NOTE — ED Notes (Signed)
Pt got lunch up to void, await room upstairs

## 2016-08-09 NOTE — ED Notes (Signed)
Gave patient a sprite zero patient is resting

## 2016-08-09 NOTE — ED Notes (Signed)
Care mx into speak to pot

## 2016-08-09 NOTE — ED Triage Notes (Signed)
Pt reports to the ED for eval of left sided sharp, stabbing CP with associated left arm pain. Pt reports some associated dizziness. Was seen by his PCP yesterday and was told to f/u here if the pain persisted. Pt is out of his Ranexa and Gabapentin. Tried 3 nitros w no relief in his pain. Pt has significant cardiac hx.

## 2016-08-09 NOTE — Discharge Planning (Signed)
EDCM consulted in regards to medicaction assistance.  Pt has insurance coverage [facility] Medicare) and is not eligible for discount program.  NCM searched for Rx discount cards and found that GoodRx does not affordable pricing either.  NCM visited website for Rx prescribed (http://[email or web address]-gonzales.[email or web address]/) to find that there was no assistance for pt enrolled in a government healthcare prescription drug program, such as Medicare Part D or Medicaid.  [facility] will consult with a [employer] rep in this area for suggestions.

## 2016-08-10 DIAGNOSIS — I25119 Atherosclerotic heart disease of native coronary artery with unspecified angina pectoris: Secondary | ICD-10-CM

## 2016-08-10 DIAGNOSIS — I119 Hypertensive heart disease without heart failure: Secondary | ICD-10-CM

## 2016-08-10 DIAGNOSIS — Z598 Other problems related to housing and economic circumstances: Secondary | ICD-10-CM

## 2016-08-10 DIAGNOSIS — I130 Hypertensive heart and chronic kidney disease with heart failure and stage 1 through stage 4 chronic kidney disease, or unspecified chronic kidney disease: Secondary | ICD-10-CM | POA: Diagnosis not present

## 2016-08-10 DIAGNOSIS — E1122 Type 2 diabetes mellitus with diabetic chronic kidney disease: Secondary | ICD-10-CM | POA: Diagnosis not present

## 2016-08-10 DIAGNOSIS — E785 Hyperlipidemia, unspecified: Secondary | ICD-10-CM

## 2016-08-10 DIAGNOSIS — I25110 Atherosclerotic heart disease of native coronary artery with unstable angina pectoris: Secondary | ICD-10-CM | POA: Diagnosis not present

## 2016-08-10 DIAGNOSIS — N183 Chronic kidney disease, stage 3 (moderate): Secondary | ICD-10-CM

## 2016-08-10 LAB — BASIC METABOLIC PANEL
ANION GAP: 7 (ref 5–15)
BUN: 28 mg/dL — AB (ref 6–20)
CHLORIDE: 112 mmol/L — AB (ref 101–111)
CO2: 19 mmol/L — AB (ref 22–32)
Calcium: 9 mg/dL (ref 8.9–10.3)
Creatinine, Ser: 2.22 mg/dL — ABNORMAL HIGH (ref 0.61–1.24)
GFR calc Af Amer: 36 mL/min — ABNORMAL LOW (ref >60)
GFR calc non Af Amer: 31 mL/min — ABNORMAL LOW (ref >60)
GLUCOSE: 146 mg/dL — AB (ref 65–99)
POTASSIUM: 5 mmol/L (ref 3.5–5.1)
Sodium: 138 mmol/L (ref 135–145)

## 2016-08-10 LAB — TROPONIN I

## 2016-08-10 LAB — GLUCOSE, CAPILLARY
Glucose-Capillary: 125 mg/dL — ABNORMAL HIGH (ref 65–99)
Glucose-Capillary: 164 mg/dL — ABNORMAL HIGH (ref 65–99)

## 2016-08-10 MED ORDER — ISOSORBIDE MONONITRATE ER 120 MG PO TB24: 240.0000 mg | ORAL_TABLET | Freq: Every day | ORAL | 11 refills | Status: DC

## 2016-08-10 MED ORDER — ISOSORBIDE MONONITRATE ER 60 MG PO TB24: 240.0000 mg | ORAL_TABLET | Freq: Every day | ORAL | Status: DC

## 2016-08-10 NOTE — Progress Notes (Signed)
Patient for discharge home with no apparent distress noted. Medications and discharge instructions explained to the patient. He verbalized understanding. Copies given to him. IV saline lock and tele pack removed. Central tele was notified

## 2016-08-10 NOTE — Discharge Summary (Signed)
Patient ID: Matthew Rowe.,  MRN: [id number], DOB/AGE: 10-18-1960 55 y.o.  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 He has ruled out for an MI. Medications adjusted, Imdur increased. He is working with his IT consultant and pharmacy to try and get Ranexa.   Discharge Vitals:  Blood pressure (!) 154/83, pulse 82, temperature 98 F (36.7 C), temperature source Oral, resp. rate 19, SpO2 100 %.    Labs: Results for orders placed or performed during the hospital encounter of 08/09/16 (from the past 24 hour(s))  Troponin I     Status: None   Collection Time: 08/09/16  3:30 PM  Result Value Ref Range   Troponin I <0.03 <0.03 ng/mL  Troponin I     Status: None   Collection Time: 08/09/16  7:10 PM  Result Value Ref Range   Troponin I <0.03 <0.03 ng/mL  Glucose, capillary     Status: Abnormal   Collection Time: 08/09/16 10:47 PM  Result Value Ref Range   Glucose-Capillary 126 (H) 65 - 99 mg/dL  Troponin I     Status: None   Collection Time: 08/10/16 12:25 AM  Result Value Ref Range   Troponin I <0.03 <0.03 ng/mL  Basic metabolic panel     Status: Abnormal   Collection Time: 08/10/16 12:25  AM  Result Value Ref Range   Sodium 138 135 - 145 mmol/L   Potassium 5.0 3.5 - 5.1 mmol/L   Chloride 112 (H) 101 - 111 mmol/L   CO2 19 (L) 22 - 32 mmol/L   Glucose, Bld 146 (H) 65 - 99 mg/dL   BUN 28 (H) 6 - 20 mg/dL   Creatinine, Ser 2.22 (H) 0.61 - 1.24 mg/dL   Calcium 9.0 8.9 - 10.3 mg/dL   GFR calc non Af Amer 31 (L) >60 mL/min   GFR calc Af Amer 36 (L) >60 mL/min   Anion gap 7 5 - 15  Glucose, capillary     Status: Abnormal   Collection Time: 08/10/16  6:40 AM  Result Value Ref Range   Glucose-Capillary 125 (H) 65 - 99 mg/dL  Glucose, capillary     Status: Abnormal   Collection Time: 08/10/16 11:08 AM  Result Value Ref Range   Glucose-Capillary 164 (H) 65 - 99 mg/dL   Comment 1 Notify RN    Comment 2 Document in Chart     Disposition:  Follow-up Information    Cecilie Kicks, NP Follow up.   Specialties:  Cardiology, Radiology Why:  office will contact you Contact information: [address] [place]  [address] [phone]           Discharge Medications:  Medication List    TAKE these medications   acetaminophen 500 MG tablet Commonly known as:  TYLENOL Take 1,000 mg by mouth every 8 (eight) hours as needed for mild pain.   amLODipine 10 MG tablet Commonly known as:  NORVASC Take 1 tablet (10 mg total) by mouth daily.   aspirin 81 MG tablet Take 1 tablet (81 mg total) by mouth daily.   carvedilol 25 MG tablet Commonly known as:  COREG TAKE TWO TABLETS BY MOUTH TWICE DAILY   clopidogrel 75 MG tablet Commonly known as:  PLAVIX Take 1 tablet (75 mg total) by mouth daily. With breakfast.   diphenhydrAMINE 25 mg capsule Commonly known as:  BENADRYL Take 25 mg by mouth daily as needed for allergies.   Fish Oil 1200 MG Caps Take 1 capsule (1,200 mg total) by mouth 2 (two) times daily.   gabapentin 300 MG capsule Commonly known as:  NEURONTIN Take 300 mg by mouth 3 (three) times daily.   hydrALAZINE 50 MG tablet Commonly known  as:  APRESOLINE Take 1 tablet (50 mg total) by mouth 3 (three) times daily.   hydrochlorothiazide 25 MG tablet Commonly known as:  HYDRODIURIL Take 25 mg by mouth daily.   isosorbide mononitrate 120 MG 24 hr tablet Commonly known as:  IMDUR Take 2 tablets (240 mg total) by mouth daily. Start taking on:  08/11/2016 What changed:  how much to take   metFORMIN 1000 MG tablet Commonly known as:  GLUCOPHAGE Take 1,000 mg by mouth 2 (two) times daily with a meal.   multivitamin with minerals Tabs tablet Take 1 tablet by mouth daily.   nitroGLYCERIN 0.4 MG/SPRAY spray Commonly known as:  NITROLINGUAL Place 1 spray under the tongue every 5 (five) minutes x 3 doses as needed for chest pain.   pantoprazole 40 MG tablet Commonly known as:  PROTONIX Take 40 mg by mouth 2 (two) times daily.   pravastatin 80 MG tablet Commonly known as:  PRAVACHOL Take 1 tablet (80 mg total) by mouth every evening.   ranolazine 500 MG 12 hr tablet Commonly known as:  RANEXA Take 1 tablet (500 mg total) by mouth 2 (two) times daily.   ULORIC 40 MG tablet Generic drug:  febuxostat Take 40 mg by mouth daily.   vitamin E 400 UNIT capsule Take 400 Units by mouth daily.   zolpidem 10 MG tablet Commonly known as:  AMBIEN Take 10 mg by mouth at bedtime as needed for sleep.        Duration of Discharge Encounter: Greater than 30 minutes including physician time.  Signed, Kerin Ransom PA-C 08/10/2016 11:55 AM   I have examined the patient and reviewed assessment and plan and discussed with patient.  Agree with above as stated.  Trying to get Ranexa through a tier exception.  Patient will f/u Dr. Saunders Revel next week.  Will try increased Imdur. There has been some success with this dose in our practice.  Hopefully, this may prevent the need for Ranexa which is more expensive.  Discharge today.  Larae Grooms

## 2016-08-10 NOTE — Progress Notes (Signed)
Patient Name: Matthew PFOST Sr. Date of Encounter: 08/10/2016  Primary Cardiologist: Dr Saunders Revel  Hospital Problem List     Active Problems:   CAD- CABG 2006, RCA DES March 2015   Essential hypertension   Type 2 diabetes mellitus with renal manifestations (HCC)   Hypertensive heart disease   Dyslipidemia   OSA on CPAP   Financial difficulty   Chest pain   Chronic diastolic CHF (congestive heart failure) (HCC)   CRI (chronic renal insufficiency), stage 3 (moderate)     Subjective   No chest pain  Inpatient Medications    Scheduled Meds: . amLODipine  10 mg Oral Daily  . aspirin  324 mg Oral NOW   Or  . aspirin  300 mg Rectal NOW  . aspirin  81 mg Oral Daily  . carvedilol  50 mg Oral BID  . clopidogrel  75 mg Oral Daily  . gabapentin  300 mg Oral TID  . heparin  5,000 Units Subcutaneous Q8H  . hydrALAZINE  50 mg Oral TID  . hydrochlorothiazide  25 mg Oral Daily  . insulin aspart  0-15 Units Subcutaneous TID WC  . [START ON 08/11/2016] isosorbide mononitrate  240 mg Oral Daily  . pantoprazole  40 mg Oral BID  . pravastatin  80 mg Oral QPM  . ranolazine  500 mg Oral BID   Continuous Infusions:  PRN Meds: acetaminophen, nitroGLYCERIN, ondansetron (ZOFRAN) IV, zolpidem   Vital Signs    Vitals:   08/09/16 2011 08/09/16 2019 08/10/16 0454 08/10/16 0932  BP: 120/66 121/61 140/73 (!) 154/83  Pulse: 80 79 84 82  Resp:   19   Temp:   98 F (36.7 C)   TempSrc:   Oral   SpO2: 100% 100% 100%    No intake or output data in the 24 hours ending 08/10/16 1156 There were no vitals filed for this visit.  Physical Exam    GEN: Obese AA male in no acute distress.  HEENT: Grossly normal.  Neck: Supple, no JVD, carotid bruits, or masses. Cardiac: RRR, no murmurs, rubs, or gallops. No clubbing, cyanosis, edema.  Radials/DP/PT 2+ and equal bilaterally.  Respiratory:  Respirations regular and unlabored, clear to auscultation bilaterally. MS: no deformity or atrophy. Skin:  warm and dry, no rash. Neuro:  Strength and sensation are intact. Psych: AAOx3.  Normal affect.  Labs    CBC  Recent Labs  08/09/16 0908  WBC 7.9  HGB 10.7*  HCT 31.9*  MCV 87.4  PLT 563   Basic Metabolic Panel  Recent Labs  08/09/16 0908 08/10/16 0025  NA 138 138  K 5.1 5.0  CL 111 112*  CO2 19* 19*  GLUCOSE 195* 146*  BUN 29* 28*  CREATININE 2.29* 2.22*  CALCIUM 9.1 9.0   Liver Function Tests No results for input(s): AST, ALT, ALKPHOS, BILITOT, PROT, ALBUMIN in the last 72 hours. No results for input(s): LIPASE, AMYLASE in the last 72 hours. Cardiac Enzymes  Recent Labs  08/09/16 1530 08/09/16 1910 08/10/16 0025  TROPONINI <0.03 <0.03 <0.03    Telemetry    NSR- Personally Reviewed  ECG     EKG IVCD- Personally Reviewed  Radiology    Dg Chest 2 View  Result Date: 08/09/2016 CLINICAL DATA:  Left-sided DIS sharp stabbing chest pain associated with arm pain and dizziness. Symptoms unresponsive to a oral nitroglycerin history of CABG in 2006 with coronary stent placement in March of 2015 EXAM: CHEST  2 VIEW COMPARISON:  PA and lateral chest x-ray of April 04, 2016 FINDINGS: The lungs are mildly hypoinflated but clear. There is no pneumothorax, pneumomediastinum, or pleural effusion. The heart is top-normal in size. The pulmonary vascularity is normal. The sternal wires are intact with exception of the wire fourth from the top which is chronically broken. The observed bony thorax exhibits no acute abnormality. IMPRESSION: Mild hypo inflation.  No acute cardiopulmonary abnormality. Electronically Signed   By: David  Martinique M.D.   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    Assessment & Plan    Plan: Increase Imdur to 240 mg daily. F/U at [facility] in 2 weeks.  Signed, Kerin Ransom, PA-C  08/10/2016, 11:56 AM   I have examined the patient and reviewed assessment and plan and discussed with patient.  Agree with above as stated.  Trying to get Ranexa through a tier exception.  Patient will f/u Dr. Saunders Revel next week.  Will try increased Imdur.  Hopefully, this may prevent the need for Ranexa which is more expensive.  Discharge today.  Matthew Rowe

## 2016-08-10 NOTE — Care Management Note (Signed)
Case Management Note  Patient Details  Name: Matthew ABE Sr. MRN: [id number] Date of Birth: 11/05/1960  Subjective/Objective:   Chest pain, dyspnea                 Action/Plan: Discharge Planning:  NCM spoke to pt and states he has completed the Ranexa patient assistance application and did not qualify for assistance through the manufacture. States he cannot afford the $100 copay for medication. States he has discussed with his physician about receiving samples from the office. Pt has medicare and does not qualify to use copay card associated with medication. States he did contact [facility] and will discuss with his Cardiologist about having them complete letter of medical necessity for drug to have [facility] do a reduction in copay price.    PCP  Barrie Lyme MD  Expected Discharge Date:  08/10/2016              Expected Discharge Plan:  Home/Self Care  In-House Referral:  NA  Discharge planning Services  CM Consult, Medication Assistance  Post Acute Care Choice:  NA Choice offered to:  NA  DME Arranged:  N/A DME Agency:  NA  HH Arranged:  NA HH Agency:  NA  Status of Service:  Completed, signed off  If discussed at [place] of Stay Meetings, dates discussed:    Additional Comments:  Erenest Rasher, RN 08/10/2016, 10:36 AM

## 2016-08-10 NOTE — Care Management Obs Status (Addendum)
[place] NOTIFICATION   Patient Details  Name: Matthew WEAKLAND Sr. MRN: [id number] Date of Birth: 10-23-1960   Medicare Observation Status Notification Given:  Yes  NCM explained notification. Declined to sign.    Erenest Rasher, RN 08/10/2016, 10:27 AM

## 2016-08-16 ENCOUNTER — Ambulatory Visit (INDEPENDENT_AMBULATORY_CARE_PROVIDER_SITE_OTHER): Payer: Medicare Other | Admitting: Internal Medicine

## 2016-08-16 ENCOUNTER — Encounter: Payer: Self-pay | Admitting: Internal Medicine

## 2016-08-16 VITALS — BP 124/70 | HR 84 | Ht 65.5 in | Wt 235.0 lb

## 2016-08-16 DIAGNOSIS — E785 Hyperlipidemia, unspecified: Secondary | ICD-10-CM

## 2016-08-16 DIAGNOSIS — Z79899 Other long term (current) drug therapy: Secondary | ICD-10-CM | POA: Diagnosis not present

## 2016-08-16 DIAGNOSIS — I25118 Atherosclerotic heart disease of native coronary artery with other forms of angina pectoris: Secondary | ICD-10-CM | POA: Diagnosis not present

## 2016-08-16 MED ORDER — ATORVASTATIN CALCIUM 80 MG PO TABS: 80.0000 mg | ORAL_TABLET | Freq: Every day | ORAL | 3 refills | Status: DC

## 2016-08-16 NOTE — Patient Instructions (Addendum)
Your physician has recommended you make the following change in your medication: STOP PRAVASTATIN  START  ATORVASTATIN   80 MG  EVERY DAY  Your physician recommends that you return for lab work in:  [place] ALT   Your physician wants you to follow-up in: 3 [place] will receive a reminder letter in the mail two months in advance. If you don't receive a letter, please call our office to schedule the follow-up appointment.

## 2016-08-16 NOTE — Progress Notes (Signed)
Follow-up Outpatient Visit Date: 08/16/2016  Chief Complaint: Chest pain  HPI:  Matthew Rowe is a 55 y.o. 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 I last saw him on 06/07/16. At that time, he reported stable angina that was well-managed with isosorbide mononitrate, carvedilol, and ranolazine. 4 July, he ran out of ranolazine in the last week of November and ultimately presented to the emergency department due to increasing chest pain. He had been unable to get additional samples of ranolazine and is unable to afford it. During his ED visit, serial troponins were negative. Isosorbide mononitrate was uptitrated to 240 mg daily, which the patient reports has returned his chest pain to baseline. He has not had any other adverse effects like headaches.  Today, the patient reports having occasional episodes of chest pain, typically with mild to moderate exertion. These resolved spontaneously with a few minutes of rest or with a single sublingual nitroglycerin tablet. He has stable exertional dyspnea. He denies orthopnea, PND, palpitations, lightheadedness, and leg edema. He noted a headache posterior to the right ear earlier today. This resolved with a single dose of acetaminophen.  --------------------------------------------------------------------------------------------------  Cardiovascular History & Procedures: Cardiovascular Problems:  Coronary artery disease with chronic stable angina  Risk Factors:  Known coronary artery disease, hypertension, hyperlipidemia, diabetes mellitus, and male gender  Cath/PCI:  LHC (12/02/13): LMCA with 30% distal stenosis. LAD with multiple stents and 50% ostial stenosis followed by 20% diffuse in-stent  restenosis proximally. There is diffuse 50% in-stent restenosis in the mid and distal segments. The distal LAD is occluded. LCx is small with 4050% ostial stenosis. OM 2 has a 50% ostial stenosis. Ramus intermedius is a large vessel with 70-80% stenosis involving its superior branch. 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 There is diffuse 20% proximal disease. There is a 90% stenosis involving the distal vessel just beyond the anastomosis with the RIMA. 60% stenosis extending into the RPDA is also evident. RIMA to RCA is widely patent with aforementioned stenosis just beyond the distal anastomosis.  PCI (12/03/13): Successful PCI to the mid/distal RCA with overlapping Promus drug-eluting stents (2.25 x 24 and 2.25 x 16 mm).  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  EP Procedures and Devices:  None  Non-Invasive Evaluation(s):  Transthoracic echocardiogram (01/02/15): Normal LV size with mild LVH. LVEF 50-55% with normal wall motion and grade 1 diastolic dysfunction. Very mild aortic stenosis. Mitral and or calcium patient with mild leaflet thickening and regurgitation. Normal RV size and function.  Pharmacologic myocardial perfusion stress test (11/28/13): Large, partially reversible inferolateral defect. Fixed apical defect. LVEF 54%.  Recent CV Pertinent Labs: Lab Results  Component Value Date   CHOL 177 03/25/2016   HDL 27 (L) 03/25/2016   LDLCALC 74 03/25/2016   LDLDIRECT 72.0 11/03/2014   TRIG 382 (H) 03/25/2016   CHOLHDL 6.6 03/25/2016   INR 0.99 05/08/2015   BNP 179.2 (H) 08/09/2016   K 5.0 08/10/2016   MG 1.6 (L) 04/04/2016   BUN 28 (H) 08/10/2016   CREATININE 2.22 (H) 08/10/2016   CREATININE 2.47 (H) 04/19/2016     Past medical and surgical history were reviewed and updated in EPIC.   Outpatient Encounter Prescriptions as  of 08/16/2016  Medication Sig  . acetaminophen (TYLENOL) 500 MG tablet Take 1,000  mg by mouth every 8 (eight) hours as needed for mild pain.   Marland Kitchen amLODipine (NORVASC) 10 MG tablet Take 1 tablet (10 mg total) by mouth daily.  Marland Kitchen aspirin 81 MG tablet Take 1 tablet (81 mg total) by mouth daily.  . carvedilol (COREG) 25 MG tablet TAKE TWO TABLETS BY MOUTH TWICE DAILY  . clopidogrel (PLAVIX) 75 MG tablet Take 1 tablet (75 mg total) by mouth daily. With breakfast.  . diphenhydrAMINE (BENADRYL) 25 mg capsule Take 25 mg by mouth daily as needed for allergies.  Marland Kitchen gabapentin (NEURONTIN) 300 MG capsule Take 300 mg by mouth 3 (three) times daily.  . hydrALAZINE (APRESOLINE) 50 MG tablet Take 1 tablet (50 mg total) by mouth 3 (three) times daily.  . hydrochlorothiazide (HYDRODIURIL) 25 MG tablet Take 25 mg by mouth daily.  . isosorbide mononitrate (IMDUR) 120 MG 24 hr tablet Take 2 tablets (240 mg total) by mouth daily.  . metFORMIN (GLUCOPHAGE) 1000 MG tablet Take 1,000 mg by mouth 2 (two) times daily with a meal.  . Multiple Vitamin (MULTIVITAMIN WITH MINERALS) TABS tablet Take 1 tablet by mouth daily.  . nitroGLYCERIN (NITROLINGUAL) 0.4 MG/SPRAY spray Place 1 spray under the tongue every 5 (five) minutes x 3 doses as needed for chest pain.  . Omega-3 Fatty Acids (FISH OIL) 1200 MG CAPS Take 1 capsule (1,200 mg total) by mouth 2 (two) times daily.  . pantoprazole (PROTONIX) 40 MG tablet Take 40 mg by mouth 2 (two) times daily.  . pravastatin (PRAVACHOL) 80 MG tablet Take 1 tablet (80 mg total) by mouth every evening.  Marland Kitchen ULORIC 40 MG tablet Take 40 mg by mouth daily.  . vitamin E 400 UNIT capsule Take 400 Units by mouth daily.  Marland Kitchen zolpidem (AMBIEN) 10 MG tablet Take 10 mg by mouth at bedtime as needed for sleep.  . ranolazine (RANEXA) 500 MG 12 hr tablet Take 1 tablet (500 mg total) by mouth 2 (two) times daily. (Patient not taking: Reported on 08/16/2016)   No facility-administered encounter medications on file as of 08/16/2016.     Allergies: Ciprocin-fluocin-procin [fluocinolone  acetonide]; Clarithromycin; Cleocin [clindamycin hcl]; and Glimepiride [amaryl]  Social History   Social History  . Marital status: Married    Spouse name: [name]  . Number of children: 5  . Years of education: N/A   Occupational History  . retired     Chartered loss adjuster   Social History Main Topics  . Smoking status: Never Smoker  . Smokeless tobacco: Never Used     Comment: smoked some as a teenager (high school)  . Alcohol use No     Comment: 09/15/2012 "drank a little when I was young"  . Drug use: No  . Sexual activity: Yes   Other Topics Concern  . Not on file   Social History Narrative   Married and lives with wife in [place]. On disability.    Consumes about 2 Coke Zeros a day     Family History  Problem Relation Age of Onset  . Lung cancer Mother   . Anemia Mother   . Polycystic kidney disease Mother   . Diabetes Father   . Heart attack Father 54    Died suddenly  . Heart failure Father   . Hyperlipidemia Father   . Hypertension Father   . Kidney failure Sister 5    Died  . Heart attack Sister   .  Hypertension Sister   . Diabetes Sister   . Diabetes Brother   . Polycystic kidney disease Brother   . Diabetes Sister     Review of Systems: A 12-system review of systems was performed and was negative except as noted in the HPI.  --------------------------------------------------------------------------------------------------  Physical Exam: BP 124/70   Pulse 84   Ht 5' 5.5" (1.664 m)   Wt 235 lb (106.6 kg)   BMI 38.51 kg/m   General:  Obese man, seated comfortably in the exam room. He is accompanied by his wife. HEENT: No conjunctival pallor or scleral icterus.  Moist mucous membranes.  OP clear. Neck: Supple without lymphadenopathy, thyromegaly, JVD, or HJR.  No carotid bruit. Lungs: Normal work of breathing.  Clear to auscultation bilaterally without wheezes or crackles. Heart: Regular rate and rhythm without murmurs, rubs, or gallops.   Non-displaced PMI. Abd: Bowel sounds present.  Soft, NT/ND without hepatosplenomegaly Ext: No lower extremity edema.  Radial, PT, and DP pulses are 2+ bilaterally. Skin: warm and dry without rash  EKG (08/10/16):  Normal sinus rhythm with left axis deviation, inferior Q waves, and anterolateral Q waves.  Lab Results  Component Value Date   WBC 7.9 08/09/2016   HGB 10.7 (L) 08/09/2016   HCT 31.9 (L) 08/09/2016   MCV 87.4 08/09/2016   PLT 217 08/09/2016    Lab Results  Component Value Date   NA 138 08/10/2016   K 5.0 08/10/2016   CL 112 (H) 08/10/2016   CO2 19 (L) 08/10/2016   BUN 28 (H) 08/10/2016   CREATININE 2.22 (H) 08/10/2016   GLUCOSE 146 (H) 08/10/2016   ALT 22 03/30/2016    Lab Results  Component Value Date   CHOL 177 03/25/2016   HDL 27 (L) 03/25/2016   LDLCALC 74 03/25/2016   LDLDIRECT 72.0 11/03/2014   TRIG 382 (H) 03/25/2016   CHOLHDL 6.6 03/25/2016    --------------------------------------------------------------------------------------------------  ASSESSMENT AND PLAN: Coronary artery disease with chronic stable angina Patient experienced worsening chest pain after running out of ranolazine. This has returned to baseline with further escalation of isosorbide mononitrate to 240 mg daily (CCS class III). We will continue his current medication regimen. He is working on Set designer for the ranolazine. I also suggested that he see if the [facility] could assist him with obtaining the medication. We will not make any changes today.  Hyperlipidemia Patient is currently on pravastatin 80 mg daily with an LDL of 74 in July. Given his extensive CAD, we have agreed to switch him to atorvastatin 80 mg daily. We will recheck a lipid panel and ALT when he returns for follow-up in 3 months.  Follow-up: Return to clinic in 3 months.  Nelva Bush, MD 08/16/2016 4:05 PM

## 2016-08-17 ENCOUNTER — Encounter: Payer: Self-pay | Admitting: Internal Medicine

## 2016-09-06 ENCOUNTER — Ambulatory Visit: Payer: Commercial Managed Care - HMO | Admitting: Neurology

## 2016-09-18 ENCOUNTER — Telehealth: Payer: Self-pay | Admitting: Internal Medicine

## 2016-09-18 NOTE — Telephone Encounter (Signed)
informed Ms. Buntyn of Dr Ends answer, she expressed understanding.

## 2016-09-18 NOTE — Telephone Encounter (Signed)
Pt's wife calling requesting samples of Renexa-pls call

## 2016-09-18 NOTE — Telephone Encounter (Signed)
I agree that Mr. Boesel will need to speak with the [facility] and his insurance company to figure out a way to obtain ranolazine. Samples have been unreliable in the past and are not a good long-term option for managing the patient's chronic angina. It should be noted that when I saw him last month, he was back to baseline without being on ranolazine. If the patient has pharmacy assistance paperwork for [place] to complete, we would be happy to help with that. Otherwise, he needs to speak with his insurance provider and the [facility] to find an alternative source of ranolazine.

## 2016-09-18 NOTE — Telephone Encounter (Signed)
Need advisement, we have samples BUT very little and DO not know when they are coming back, pt has been obtaining samples for 2 years, also in last OV note he was suppose to be having the [facility] help him get assistants for the Ranexa, please advise. Pt has not been taking Ranexa since December and last hospital stay. Also he is suppose to go back to the [place] and work on assistance with them, we dont have enough samples to keep giving him, please check with Dr End and let me know what we need to do , thanks

## 2016-09-28 ENCOUNTER — Other Ambulatory Visit: Payer: Self-pay | Admitting: Cardiology

## 2016-11-08 ENCOUNTER — Encounter: Payer: Self-pay | Admitting: Internal Medicine

## 2016-11-12 ENCOUNTER — Other Ambulatory Visit: Payer: Medicare Other | Admitting: *Deleted

## 2016-11-12 DIAGNOSIS — E785 Hyperlipidemia, unspecified: Secondary | ICD-10-CM

## 2016-11-12 DIAGNOSIS — Z79899 Other long term (current) drug therapy: Secondary | ICD-10-CM

## 2016-11-12 NOTE — Addendum Note (Signed)
Addended by: Eulis Foster on: 11/12/2016 01:40 PM   Modules accepted: Orders

## 2016-11-12 NOTE — Progress Notes (Signed)
[id number] 

## 2016-11-13 LAB — LIPID PANEL
Chol/HDL Ratio: 5.2 ratio units — ABNORMAL HIGH (ref 0.0–5.0)
Cholesterol, Total: 161 mg/dL (ref 100–199)
HDL: 31 mg/dL — ABNORMAL LOW (ref >39)
LDL Calculated: 86 mg/dL (ref 0–99)
TRIGLYCERIDES: 219 mg/dL — AB (ref 0–149)
VLDL Cholesterol Cal: 44 mg/dL — ABNORMAL HIGH (ref 5–40)

## 2016-11-13 LAB — ALT: ALT: 8 IU/L (ref 0–44)

## 2016-11-15 ENCOUNTER — Ambulatory Visit: Payer: Medicare Other | Admitting: Internal Medicine

## 2016-11-15 ENCOUNTER — Observation Stay (HOSPITAL_COMMUNITY)
Admission: EM | Admit: 2016-11-15 | Discharge: 2016-11-16 | Disposition: A | Discharge status: Home / Self Care | Payer: Medicare Other | Attending: Cardiology | Admitting: Cardiology

## 2016-11-15 ENCOUNTER — Encounter (HOSPITAL_COMMUNITY): Payer: Self-pay | Admitting: Emergency Medicine

## 2016-11-15 ENCOUNTER — Emergency Department (HOSPITAL_COMMUNITY): Payer: Medicare Other

## 2016-11-15 DIAGNOSIS — E784 Other hyperlipidemia: Secondary | ICD-10-CM

## 2016-11-15 DIAGNOSIS — Y831 Surgical operation with implant of artificial internal device as the cause of abnormal reaction of the patient, or of later complication, without mention of misadventure at the time of the procedure: Secondary | ICD-10-CM | POA: Insufficient documentation

## 2016-11-15 DIAGNOSIS — Z7902 Long term (current) use of antithrombotics/antiplatelets: Secondary | ICD-10-CM | POA: Diagnosis not present

## 2016-11-15 DIAGNOSIS — I1310 Hypertensive heart and chronic kidney disease without heart failure, with stage 1 through stage 4 chronic kidney disease, or unspecified chronic kidney disease: Secondary | ICD-10-CM | POA: Insufficient documentation

## 2016-11-15 DIAGNOSIS — E1121 Type 2 diabetes mellitus with diabetic nephropathy: Secondary | ICD-10-CM | POA: Diagnosis present

## 2016-11-15 DIAGNOSIS — R079 Chest pain, unspecified: Secondary | ICD-10-CM

## 2016-11-15 DIAGNOSIS — E785 Hyperlipidemia, unspecified: Secondary | ICD-10-CM

## 2016-11-15 DIAGNOSIS — Q613 Polycystic kidney, unspecified: Secondary | ICD-10-CM | POA: Diagnosis not present

## 2016-11-15 DIAGNOSIS — M109 Gout, unspecified: Secondary | ICD-10-CM | POA: Diagnosis not present

## 2016-11-15 DIAGNOSIS — K219 Gastro-esophageal reflux disease without esophagitis: Secondary | ICD-10-CM | POA: Diagnosis present

## 2016-11-15 DIAGNOSIS — E1169 Type 2 diabetes mellitus with other specified complication: Secondary | ICD-10-CM | POA: Diagnosis present

## 2016-11-15 DIAGNOSIS — I25110 Atherosclerotic heart disease of native coronary artery with unstable angina pectoris: Principal | ICD-10-CM | POA: Insufficient documentation

## 2016-11-15 DIAGNOSIS — R072 Precordial pain: Secondary | ICD-10-CM | POA: Diagnosis present

## 2016-11-15 DIAGNOSIS — G4733 Obstructive sleep apnea (adult) (pediatric): Secondary | ICD-10-CM

## 2016-11-15 DIAGNOSIS — G8929 Other chronic pain: Secondary | ICD-10-CM | POA: Insufficient documentation

## 2016-11-15 DIAGNOSIS — E669 Obesity, unspecified: Secondary | ICD-10-CM | POA: Diagnosis present

## 2016-11-15 DIAGNOSIS — I152 Hypertension secondary to endocrine disorders: Secondary | ICD-10-CM | POA: Diagnosis present

## 2016-11-15 DIAGNOSIS — Z955 Presence of coronary angioplasty implant and graft: Secondary | ICD-10-CM | POA: Diagnosis not present

## 2016-11-15 DIAGNOSIS — I252 Old myocardial infarction: Secondary | ICD-10-CM | POA: Diagnosis not present

## 2016-11-15 DIAGNOSIS — I200 Unstable angina: Secondary | ICD-10-CM

## 2016-11-15 DIAGNOSIS — E1122 Type 2 diabetes mellitus with diabetic chronic kidney disease: Secondary | ICD-10-CM | POA: Diagnosis not present

## 2016-11-15 DIAGNOSIS — E7849 Other hyperlipidemia: Secondary | ICD-10-CM

## 2016-11-15 DIAGNOSIS — I444 Left anterior fascicular block: Secondary | ICD-10-CM | POA: Insufficient documentation

## 2016-11-15 DIAGNOSIS — Z7982 Long term (current) use of aspirin: Secondary | ICD-10-CM | POA: Insufficient documentation

## 2016-11-15 DIAGNOSIS — N183 Chronic kidney disease, stage 3 unspecified: Secondary | ICD-10-CM

## 2016-11-15 DIAGNOSIS — T82855D Stenosis of coronary artery stent, subsequent encounter: Secondary | ICD-10-CM | POA: Diagnosis not present

## 2016-11-15 DIAGNOSIS — Z7984 Long term (current) use of oral hypoglycemic drugs: Secondary | ICD-10-CM | POA: Insufficient documentation

## 2016-11-15 DIAGNOSIS — Z8249 Family history of ischemic heart disease and other diseases of the circulatory system: Secondary | ICD-10-CM | POA: Insufficient documentation

## 2016-11-15 DIAGNOSIS — Z951 Presence of aortocoronary bypass graft: Secondary | ICD-10-CM | POA: Diagnosis not present

## 2016-11-15 DIAGNOSIS — Z9989 Dependence on other enabling machines and devices: Secondary | ICD-10-CM

## 2016-11-15 DIAGNOSIS — Z833 Family history of diabetes mellitus: Secondary | ICD-10-CM | POA: Diagnosis not present

## 2016-11-15 DIAGNOSIS — Z6841 Body Mass Index (BMI) 40.0 and over, adult: Secondary | ICD-10-CM | POA: Insufficient documentation

## 2016-11-15 DIAGNOSIS — I10 Essential (primary) hypertension: Secondary | ICD-10-CM | POA: Diagnosis not present

## 2016-11-15 DIAGNOSIS — Z9861 Coronary angioplasty status: Secondary | ICD-10-CM

## 2016-11-15 DIAGNOSIS — Z87891 Personal history of nicotine dependence: Secondary | ICD-10-CM | POA: Diagnosis not present

## 2016-11-15 DIAGNOSIS — E1159 Type 2 diabetes mellitus with other circulatory complications: Secondary | ICD-10-CM | POA: Diagnosis present

## 2016-11-15 DIAGNOSIS — I2510 Atherosclerotic heart disease of native coronary artery without angina pectoris: Secondary | ICD-10-CM | POA: Diagnosis present

## 2016-11-15 LAB — BASIC METABOLIC PANEL
Anion gap: 10 (ref 5–15)
BUN: 30 mg/dL — ABNORMAL HIGH (ref 6–20)
CHLORIDE: 117 mmol/L — AB (ref 101–111)
CO2: 16 mmol/L — AB (ref 22–32)
Calcium: 9.3 mg/dL (ref 8.9–10.3)
Creatinine, Ser: 2.42 mg/dL — ABNORMAL HIGH (ref 0.61–1.24)
GFR calc non Af Amer: 28 mL/min — ABNORMAL LOW (ref >60)
GFR, EST AFRICAN AMERICAN: 33 mL/min — AB (ref >60)
Glucose, Bld: 143 mg/dL — ABNORMAL HIGH (ref 65–99)
POTASSIUM: 4.8 mmol/L (ref 3.5–5.1)
SODIUM: 143 mmol/L (ref 135–145)

## 2016-11-15 LAB — CBC
HEMATOCRIT: 31.5 % — AB (ref 39.0–52.0)
HEMOGLOBIN: 10.1 g/dL — AB (ref 13.0–17.0)
MCH: 28.2 pg (ref 26.0–34.0)
MCHC: 32.1 g/dL (ref 30.0–36.0)
MCV: 88 fL (ref 78.0–100.0)
Platelets: 212 10*3/uL (ref 150–400)
RBC: 3.58 MIL/uL — AB (ref 4.22–5.81)
RDW: 13.5 % (ref 11.5–15.5)
WBC: 7.6 10*3/uL (ref 4.0–10.5)

## 2016-11-15 LAB — TROPONIN I: Troponin I: <0.03 ng/mL (ref <0.03)

## 2016-11-15 LAB — GLUCOSE, CAPILLARY: GLUCOSE-CAPILLARY: 126 mg/dL — AB (ref 65–99)

## 2016-11-15 LAB — I-STAT TROPONIN, ED: Troponin i, poc: 0.01 ng/mL (ref 0.00–0.08)

## 2016-11-15 MED ORDER — ASPIRIN EC 81 MG PO TBEC
81.0000 mg | DELAYED_RELEASE_TABLET | Freq: Every day | ORAL | Status: DC
  Administered 2016-11-16: 81 mg via ORAL
  Filled 2016-11-15 (×2): qty 1

## 2016-11-15 MED ORDER — OMEGA-3-ACID ETHYL ESTERS 1 G PO CAPS
1.0000 g | ORAL_CAPSULE | Freq: Two times a day (BID) | ORAL | Status: DC
  Administered 2016-11-15 – 2016-11-16 (×2): 1 g via ORAL
  Filled 2016-11-15 (×2): qty 1

## 2016-11-15 MED ORDER — ASPIRIN 81 MG PO CHEW
324.0000 mg | CHEWABLE_TABLET | Freq: Once | ORAL | Status: AC
Start: 2016-11-15 — End: 2016-11-15
  Administered 2016-11-15: 324 mg via ORAL
  Filled 2016-11-15: qty 4

## 2016-11-15 MED ORDER — NITROGLYCERIN 0.4 MG/SPRAY TL SOLN: 1.0000 | Status: DC | PRN

## 2016-11-15 MED ORDER — HYDROCHLOROTHIAZIDE 25 MG PO TABS
25.0000 mg | ORAL_TABLET | Freq: Every day | ORAL | Status: DC
  Administered 2016-11-15 – 2016-11-16 (×2): 25 mg via ORAL
  Filled 2016-11-15 (×2): qty 1

## 2016-11-15 MED ORDER — CARVEDILOL 25 MG PO TABS
50.0000 mg | ORAL_TABLET | Freq: Two times a day (BID) | ORAL | Status: DC
  Administered 2016-11-15 – 2016-11-16 (×2): 50 mg via ORAL
  Filled 2016-11-15 (×2): qty 2

## 2016-11-15 MED ORDER — PANTOPRAZOLE SODIUM 40 MG PO TBEC
40.0000 mg | DELAYED_RELEASE_TABLET | Freq: Every day | ORAL | Status: DC
  Administered 2016-11-15 – 2016-11-16 (×2): 40 mg via ORAL
  Filled 2016-11-15 (×2): qty 1

## 2016-11-15 MED ORDER — CLOPIDOGREL BISULFATE 75 MG PO TABS
75.0000 mg | ORAL_TABLET | Freq: Every day | ORAL | Status: DC
  Administered 2016-11-16: 75 mg via ORAL
  Filled 2016-11-15 (×2): qty 1

## 2016-11-15 MED ORDER — ATORVASTATIN CALCIUM 80 MG PO TABS: 80.0000 mg | ORAL_TABLET | Freq: Every day | ORAL | Status: DC

## 2016-11-15 MED ORDER — NITROGLYCERIN 0.4 MG SL SUBL
0.4000 mg | SUBLINGUAL_TABLET | SUBLINGUAL | Status: DC | PRN
  Administered 2016-11-15 – 2016-11-16 (×3): 0.4 mg via SUBLINGUAL
  Filled 2016-11-15: qty 1

## 2016-11-15 MED ORDER — DIPHENHYDRAMINE HCL 25 MG PO CAPS: 25.0000 mg | ORAL_CAPSULE | Freq: Every day | ORAL | Status: DC | PRN

## 2016-11-15 MED ORDER — PRAVASTATIN SODIUM 40 MG PO TABS
80.0000 mg | ORAL_TABLET | Freq: Every day | ORAL | Status: DC
  Administered 2016-11-15: 80 mg via ORAL
  Filled 2016-11-15: qty 2

## 2016-11-15 MED ORDER — AMLODIPINE BESYLATE 10 MG PO TABS
10.0000 mg | ORAL_TABLET | Freq: Every day | ORAL | Status: DC
  Administered 2016-11-15 – 2016-11-16 (×2): 10 mg via ORAL
  Filled 2016-11-15 (×2): qty 1

## 2016-11-15 MED ORDER — ACETAMINOPHEN 500 MG PO TABS: 1000.0000 mg | ORAL_TABLET | Freq: Three times a day (TID) | ORAL | Status: DC | PRN

## 2016-11-15 MED ORDER — RANOLAZINE ER 500 MG PO TB12
500.0000 mg | ORAL_TABLET | Freq: Two times a day (BID) | ORAL | Status: DC
  Administered 2016-11-15 – 2016-11-16 (×2): 500 mg via ORAL
  Filled 2016-11-15 (×2): qty 1

## 2016-11-15 MED ORDER — NITROGLYCERIN IN D5W 200-5 MCG/ML-% IV SOLN
0.0000 ug/min | INTRAVENOUS | Status: DC
  Administered 2016-11-15: 5 ug/min via INTRAVENOUS
  Administered 2016-11-15: 10 ug/min via INTRAVENOUS
  Filled 2016-11-15: qty 250

## 2016-11-15 MED ORDER — HEPARIN (PORCINE) IN NACL 100-0.45 UNIT/ML-% IJ SOLN
1200.0000 [IU]/h | INTRAMUSCULAR | Status: DC
  Administered 2016-11-15: 1200 [IU]/h via INTRAVENOUS
  Filled 2016-11-15 (×2): qty 250

## 2016-11-15 MED ORDER — ZOLPIDEM TARTRATE 5 MG PO TABS
10.0000 mg | ORAL_TABLET | Freq: Every evening | ORAL | Status: DC | PRN
  Administered 2016-11-15: 10 mg via ORAL
  Filled 2016-11-15: qty 2

## 2016-11-15 MED ORDER — ADULT MULTIVITAMIN W/MINERALS CH
1.0000 | ORAL_TABLET | Freq: Every day | ORAL | Status: DC
  Administered 2016-11-15 – 2016-11-16 (×2): 1 via ORAL
  Filled 2016-11-15 (×2): qty 1

## 2016-11-15 MED ORDER — VITAMIN E 180 MG (400 UNIT) PO CAPS
400.0000 [IU] | ORAL_CAPSULE | Freq: Every day | ORAL | Status: DC
  Administered 2016-11-16: 400 [IU] via ORAL
  Filled 2016-11-15: qty 1

## 2016-11-15 MED ORDER — GABAPENTIN 300 MG PO CAPS
300.0000 mg | ORAL_CAPSULE | Freq: Three times a day (TID) | ORAL | Status: DC
  Administered 2016-11-15 – 2016-11-16 (×2): 300 mg via ORAL
  Filled 2016-11-15 (×3): qty 1

## 2016-11-15 MED ORDER — ASPIRIN 81 MG PO TABS: 81.0000 mg | ORAL_TABLET | Freq: Every day | ORAL | Status: DC

## 2016-11-15 MED ORDER — ONDANSETRON HCL 4 MG/2ML IJ SOLN: 4.0000 mg | Freq: Four times a day (QID) | INTRAMUSCULAR | Status: DC | PRN

## 2016-11-15 MED ORDER — ACETAMINOPHEN 325 MG PO TABS
650.0000 mg | ORAL_TABLET | ORAL | Status: DC | PRN
  Administered 2016-11-16: 650 mg via ORAL
  Filled 2016-11-15: qty 2

## 2016-11-15 MED ORDER — HYDRALAZINE HCL 50 MG PO TABS
50.0000 mg | ORAL_TABLET | Freq: Three times a day (TID) | ORAL | Status: DC
  Administered 2016-11-15 – 2016-11-16 (×2): 50 mg via ORAL
  Filled 2016-11-15 (×3): qty 1

## 2016-11-15 MED ORDER — TRAMADOL HCL 50 MG PO TABS
50.0000 mg | ORAL_TABLET | Freq: Three times a day (TID) | ORAL | Status: DC | PRN
  Administered 2016-11-15: 50 mg via ORAL
  Filled 2016-11-15: qty 1

## 2016-11-15 MED ORDER — HEPARIN BOLUS VIA INFUSION
4000.0000 [IU] | Freq: Once | INTRAVENOUS | Status: AC
  Administered 2016-11-15: 4000 [IU] via INTRAVENOUS
  Filled 2016-11-15: qty 4000

## 2016-11-15 NOTE — ED Provider Notes (Signed)
[facility] DEPT Provider Note   CSN: [id number] Arrival date & time: 11/15/16  1403     History   Chief Complaint Chief Complaint  Patient presents with  . Chest Pain    HPI Matthew Oien. is a 56 y.o. male.  Matthew Kirks Sr. Is a 56 y.o. Male with a history of previous CABG, heart cath with stenting in 2014, diabetes, and CKD, who presents to the ED complaining of intermittent left sided chest pain since yesterday. Patient reports he began having chest pain yesterday that was worse with exertion. He reports this resolved and then returned again today around 11 AM [place] at the mall. He reports his chest pain returned at the mall when he had gradual onset of worsening chest pain that is left-sided and nonradiating. He reports using his nitroglycerin spray with some relief of his pain. He took his daily baby aspirin today. No other treatments attempted prior to arrival. He reports currently his pain in his chest as an 8 out of 10. He reports some mild shortness of breath. He was due to see his cardiologist Dr. Saunders Revel today, but came to the ER instead. He denies fevers, coughing, abdominal pain, nausea, vomiting, diarrhea, numbness, tingling, rashes or syncope.    The history is provided by the patient and medical records. No language interpreter was used.  Chest Pain   Associated symptoms include shortness of breath. Pertinent negatives include no abdominal pain, no back pain, no cough, no fever, no headaches, no nausea, no palpitations, no vomiting and no weakness.    Past Medical History:  Diagnosis Date  . Anginal pain ([place])   . CKD (chronic kidney disease), stage III   . Coronary artery disease    a. INF MI 1999: PCI of RCA;  b.  extensive stenting of LAD;  c. s/p CABG with RIMA->RCA in 2006;  d. Stable, Low risk MV 05/2012; e. 09/2012 Cath: stable anatomy->med Rx. f. Abnl nuc 11/2013 -> med rx; g. 11/2013 Cath/PCI: LM 30d, LAD 50ost, 20/50isr, 100d, LCX 40-50ost, OM2 50ost, RI  70-80ost sup branch, RCA 20p, 90/60d(2.25x17 & 2.25x24 Promus DES'), PDA 60p, RIMA->RCA ok.  . DM2 (diabetes mellitus, type 2) ([place])   . Dyslipidemia   . GERD (gastroesophageal reflux disease)   . History of gout   . Hx of echocardiogram    a. Echo 3/12: mild LVH, EF 55-60%, trivial AI  . Hypertensive heart disease   . Myocardial infarction 1999; 2002; 2003; 2006; ~ 2008  . OSA on CPAP   . Polycystic kidney disease     Patient Active Problem List   Diagnosis Date Noted  . CRI (chronic renal insufficiency), stage 3 (moderate)   . Angina at rest [facility]) 03/24/2016  . GERD (gastroesophageal reflux disease) 05/08/2015  . Gout 05/08/2015  . Chronic diastolic CHF (congestive heart failure) ([place]) 05/08/2015  . Pain in the chest   . CAD- CABG 2006, RCA DES March 2015   . Hypertensive heart disease   . Unstable angina ([place]) 12/01/2013  . Chest pain 11/27/2013  . Financial difficulty 03/13/2013  . Medically noncompliant 03/13/2013  . Type 2 diabetes mellitus with renal manifestations ([place])   . Obesity-BMI 42 11/27/2012  . Acute renal failure superimposed on stage 3 chronic kidney disease ([place]) 09/15/2012  . Polycystic kidney disease 09/15/2012  . OSA on CPAP 09/15/2012  . Essential hypertension 03/27/2011  . Dyslipidemia     Past Surgical History:  Procedure Laterality Date  . CARDIAC  CATHETERIZATION  2012  . CARDIAC CATHETERIZATION  2013  . CARDIAC CATHETERIZATION  2014   LHC (1/14): total occlusion of distal LAD, diffuse disease in ramus, 70% proximal LCx, 50% proximal and mid RCA, 50% distal RCA, patent RIMA-RCA.  Marland Kitchen CORONARY ANGIOPLASTY WITH STENT PLACEMENT  1999 / 2002 / 2004 / 2006?  . CORONARY ARTERY BYPASS GRAFT  2006   CABG X?; in [place]  . CYSTECTOMY  1990's   off face  . LEFT HEART CATHETERIZATION WITH CORONARY/GRAFT ANGIOGRAM N/A 09/16/2012   Procedure: LEFT HEART CATHETERIZATION WITH [name];  Surgeon: [name], MD;  Location: [facility] CATH LAB;   Service: Cardiovascular;  Laterality: N/A;  . LEFT HEART CATHETERIZATION WITH CORONARY/GRAFT ANGIOGRAM N/A 12/02/2013   Procedure: LEFT HEART CATHETERIZATION WITH [name];  Surgeon: [name], MD;  Location: [facility] CATH LAB;  Service: Cardiovascular;  Laterality: N/A;  . PERCUTANEOUS CORONARY STENT INTERVENTION (PCI-S) N/A 12/03/2013   Procedure: PERCUTANEOUS CORONARY STENT INTERVENTION (PCI-S);  Surgeon: [name], MD;  Location: [facility] CATH LAB;  Service: Cardiovascular;  Laterality: N/A;       Home Medications    Prior to Admission medications   Medication Sig Start Date End Date Taking? Authorizing Provider  acetaminophen (TYLENOL) 500 MG tablet Take 1,000 mg by mouth every 8 (eight) hours as needed for mild pain.    Yes Historical Provider, MD  amLODipine (NORVASC) 10 MG tablet Take 1 tablet (10 mg total) by mouth daily. 01/12/15  Yes Gay Filler Copland, MD  aspirin 81 MG tablet Take 1 tablet (81 mg total) by mouth daily. 12/04/13  Yes Rogelia Mire, NP  carvedilol (COREG) 25 MG tablet TAKE TWO TABLETS BY MOUTH TWICE DAILY 09/28/16  Yes Nelva Bush, MD  clopidogrel (PLAVIX) 75 MG tablet Take 1 tablet (75 mg total) by mouth daily. With breakfast. 01/23/16  Yes Larey Dresser, MD  diphenhydrAMINE (BENADRYL) 25 mg capsule Take 25 mg by mouth daily as needed for allergies.   Yes Historical Provider, MD  diphenhydramine-acetaminophen (TYLENOL PM) 25-500 MG TABS tablet Take 1 tablet by mouth at bedtime.   Yes Historical Provider, MD  gabapentin (NEURONTIN) 300 MG capsule Take 300 mg by mouth 3 (three) times daily.   Yes Historical Provider, MD  hydrALAZINE (APRESOLINE) 50 MG tablet Take 1 tablet (50 mg total) by mouth 3 (three) times daily. 06/07/16  Yes Christopher End, MD  hydrochlorothiazide (HYDRODIURIL) 25 MG tablet Take 25 mg by mouth daily.   Yes Historical Provider, MD  isosorbide mononitrate (IMDUR) 120 MG 24 hr tablet Take 2 tablets (240 mg total) by mouth  daily. 08/11/16  Yes Luke K Kilroy, PA-C  metFORMIN (GLUCOPHAGE) 1000 MG tablet Take 1,000 mg by mouth 2 (two) times daily with a meal.   Yes Historical Provider, MD  Multiple Vitamin (MULTIVITAMIN WITH MINERALS) TABS tablet Take 1 tablet by mouth daily.   Yes Historical Provider, MD  nitroGLYCERIN (NITROLINGUAL) 0.4 MG/SPRAY spray Place 1 spray under the tongue every 5 (five) minutes x 3 doses as needed for chest pain. 01/23/16  Yes Larey Dresser, MD  Omega-3 Fatty Acids (FISH OIL) 1200 MG CAPS Take 1,200 mg by mouth daily.  02/03/15  Yes Larey Dresser, MD  pantoprazole (PROTONIX) 40 MG tablet Take 40 mg by mouth daily.    Yes Historical Provider, MD  pravastatin (PRAVACHOL) 80 MG tablet Take 80 mg by mouth daily.   Yes Historical Provider, MD  traMADol (ULTRAM) 50 MG tablet Take 50 mg  by mouth every 8 (eight) hours as needed for moderate pain.   Yes Historical Provider, MD  vitamin E 400 UNIT capsule Take 400 Units by mouth daily.   Yes Historical Provider, MD  zolpidem (AMBIEN) 10 MG tablet Take 10 mg by mouth at bedtime as needed for sleep.   Yes Historical Provider, MD  atorvastatin (LIPITOR) 80 MG tablet Take 1 tablet (80 mg total) by mouth daily. 08/16/16 11/14/16  Nelva Bush, MD  ranolazine (RANEXA) 500 MG 12 hr tablet Take 1 tablet (500 mg total) by mouth 2 (two) times daily. Patient not taking: Reported on 08/16/2016 12/14/15   Larey Dresser, MD    Family History Family History  Problem Relation Age of Onset  . Lung cancer Mother   . Anemia Mother   . Polycystic kidney disease Mother   . Diabetes Father   . Heart attack Father 6    Died suddenly  . Heart failure Father   . Hyperlipidemia Father   . Hypertension Father   . Kidney failure Sister 5    Died  . Heart attack Sister   . Hypertension Sister   . Diabetes Sister   . Diabetes Brother   . Polycystic kidney disease Brother   . Diabetes Sister     Social History Social History  Substance Use Topics  . Smoking  status: Never Smoker  . Smokeless tobacco: Never Used     Comment: smoked some as a teenager (high school)  . Alcohol use No     Comment: 09/15/2012 "drank a little when I was young"     Allergies   Ciprocin-fluocin-procin [fluocinolone acetonide]; Clarithromycin; Cleocin [clindamycin hcl]; and Glimepiride [amaryl]   Review of Systems Review of Systems  Constitutional: Negative for chills and fever.  HENT: Negative for congestion and sore throat.   Eyes: Negative for visual disturbance.  Respiratory: Positive for shortness of breath. Negative for cough and wheezing.   Cardiovascular: Positive for chest pain. Negative for palpitations and leg swelling.  Gastrointestinal: Negative for abdominal pain, diarrhea, nausea and vomiting.  Genitourinary: Negative for dysuria.  Musculoskeletal: Negative for back pain and neck pain.  Skin: Negative for rash.  Neurological: Negative for syncope, weakness, light-headedness and headaches.     Physical Exam Updated Vital Signs BP 141/78 (BP Location: Left Arm)   Pulse 84   Temp 98 F (36.7 C) (Oral)   Resp (!) 28   SpO2 100%   Physical Exam  Constitutional: He appears well-developed and well-nourished. No distress.  Nontoxic appearing. Obese male.  HENT:  Head: Normocephalic and atraumatic.  Mouth/Throat: Oropharynx is clear and moist.  Eyes: Conjunctivae are normal. Pupils are equal, round, and reactive to light. Right eye exhibits no discharge. Left eye exhibits no discharge.  Neck: Normal range of motion. Neck supple. No JVD present. No tracheal deviation present.  Cardiovascular: Normal rate, regular rhythm, normal heart sounds and intact distal pulses.  Exam reveals no gallop and no friction rub.   No murmur heard. Bilateral radial, posterior tibialis and dorsalis pedis pulses are intact.    Pulmonary/Chest: Effort normal and breath sounds normal. No stridor. No respiratory distress. He has no wheezes. He has no rales. He exhibits  tenderness.  Lungs clear to auscultation bilaterally. Symmetric chest expansion bilaterally. Left-sided chest wall is tender to palpation and reproduces his chest pain.  Abdominal: Soft. Bowel sounds are normal. He exhibits no distension. There is no tenderness. There is no rebound and no guarding.  Musculoskeletal: He exhibits  no edema or tenderness.  No lower extremity edema or tenderness.  Lymphadenopathy:    He has no cervical adenopathy.  Neurological: He is alert. Coordination normal.  Skin: Skin is warm and dry. Capillary refill takes less than 2 seconds. No rash noted. He is not diaphoretic. No erythema. No pallor.  Psychiatric: He has a normal mood and affect. His behavior is normal.  Nursing note and vitals reviewed.    ED Treatments / Results  Labs (all labs ordered are listed, but only abnormal results are displayed) Labs Reviewed  BASIC METABOLIC PANEL - Abnormal; Notable for the following:       Result Value   Chloride 117 (*)    CO2 16 (*)    Glucose, Bld 143 (*)    BUN 30 (*)    Creatinine, Ser 2.42 (*)    GFR calc non Af Amer 28 (*)    GFR calc Af Amer 33 (*)    All other components within normal limits  CBC - Abnormal; Notable for the following:    RBC 3.58 (*)    Hemoglobin 10.1 (*)    HCT 31.5 (*)    All other components within normal limits  I-STAT TROPOININ, ED    EKG  EKG Interpretation  Date/Time:  Thursday November 15 2016 14:12:51 EST Ventricular Rate:  92 PR Interval:  150 QRS Duration: 104 QT Interval:  340 QTC Calculation: 420 R Axis:   149 Text Interpretation:  Normal sinus rhythm Left posterior fascicular block Cannot rule out Inferior infarct , age undetermined Anterior infarct , age undetermined Abnormal ECG No STEMI.  Confirmed by LONG MD, JOSHUA [phone]) on 11/15/2016 2:34:19 PM Also confirmed by Ellender Hose MD, Lysbeth Galas [phone])  on 11/15/2016 3:38:46 PM       Radiology Dg Chest 2 View  Result Date: 11/15/2016 CLINICAL DATA:  Chest pain. EXAM:  CHEST  2 VIEW COMPARISON:  Radiographs of August 09, 2016. FINDINGS: The heart size and mediastinal contours are within normal limits. Both lungs are clear. No pneumothorax or pleural effusion is noted. Sternotomy wires are noted. The visualized skeletal structures are unremarkable. IMPRESSION: No active cardiopulmonary disease. Electronically Signed   By: Marijo Conception, M.D.   On: 11/15/2016 14:42    Procedures Procedures (including critical care time)  Medications Ordered in ED Medications  aspirin chewable tablet 324 mg (324 mg Oral Given 11/15/16 1604)     Initial Impression / Assessment and Plan / ED Course  I have reviewed the triage vital signs and the nursing notes.  Pertinent labs & imaging results that were available during my care of the patient were reviewed by me and considered in my medical decision making (see chart for details).    This is a 56 y.o. Male with a history of previous CABG, heart cath with stenting in 2014, diabetes, and CKD, who presents to the ED complaining of intermittent left sided chest pain since yesterday. Patient reports he began having chest pain yesterday that was worse with exertion. He reports this resolved and then returned again today around 11 AM while at the mall. He reports his chest pain returned at the mall when he had gradual onset of worsening chest pain that is left-sided and nonradiating. He reports using his nitroglycerin spray with some relief of his pain. He took his daily baby aspirin today. No other treatments attempted prior to arrival. He reports currently his pain in his chest as an 8 out of 10. On exam the patient  is afebrile nontoxic appearing. He has no lower extremity edema or tenderness. His lungs are clear to auscultation bilaterally. Troponin is not elevated. EKG shows no acute changes. BMP is remarkable for a creatinine of 2.42 which is a slight increase from his baseline. Chest x-ray is without acute findings. As patient has  multiple risk factors and has had previous CABG and stenting I consult with cardiology. Cardiology evaluated the patient and admitted the patient for ACS rule out and further evaluation of his chest pain.     Final Clinical Impressions(s) / ED Diagnoses   Final diagnoses:  Precordial pain  Stage 3 chronic kidney disease  Other hyperlipidemia  Essential hypertension    New Prescriptions New Prescriptions   No medications on file     Waynetta Pean, PA-C 11/16/16 0157    Duffy Bruce, MD 11/16/16 1105

## 2016-11-15 NOTE — Progress Notes (Signed)
ANTICOAGULATION CONSULT NOTE - Initial Consult  Pharmacy Consult for Heparin Indication: chest pain/ACS  Allergies  Allergen Reactions  . Ciprocin-Fluocin-Procin [Fluocinolone Acetonide] Rash  . Clarithromycin Itching and Other (See Comments)    "Biaxin" Eyes itch and burn  . Cleocin [Clindamycin Hcl] Anaphylaxis and Swelling  . Glimepiride [Amaryl] Other (See Comments)    Elevates liver function     Patient Measurements:   Heparin Dosing Weight: 90kg  Vital Signs: Temp: 98 F (36.7 C) (03/08 1510) Temp Source: Oral (03/08 1510) BP: 147/84 (03/08 1930) Pulse Rate: 89 (03/08 1930)  Labs:  Recent Labs  11/15/16 1419  HGB 10.1*  HCT 31.5*  PLT 212  CREATININE 2.42*    CrCl cannot be calculated (Unknown ideal weight.).   Medical History: Past Medical History:  Diagnosis Date  . Anginal pain ([place])   . CKD (chronic kidney disease), stage III   . Coronary artery disease    a. INF MI 1999: PCI of RCA;  b.  extensive stenting of LAD;  c. s/p CABG with RIMA->RCA in 2006;  d. Stable, Low risk MV 05/2012; e. 09/2012 Cath: stable anatomy->med Rx. f. Abnl nuc 11/2013 -> med rx; g. 11/2013 Cath/PCI: LM 30d, LAD 50ost, 20/50isr, 100d, LCX 40-50ost, OM2 50ost, RI 70-80ost sup branch, RCA 20p, 90/60d(2.25x17 & 2.25x24 Promus DES'), PDA 60p, RIMA->RCA ok.  . DM2 (diabetes mellitus, type 2) ([place])   . Dyslipidemia   . GERD (gastroesophageal reflux disease)   . History of gout   . Hx of echocardiogram    a. Echo 3/12: mild LVH, EF 55-60%, trivial AI  . Hypertensive heart disease   . Myocardial infarction 1999; 2002; 2003; 2006; ~ 2008  . OSA on CPAP   . Polycystic kidney disease     Medications:  Prescriptions Prior to Admission  Medication Sig Dispense Refill Last Dose  . acetaminophen (TYLENOL) 500 MG tablet Take 1,000 mg by mouth every 8 (eight) hours as needed for mild pain.    11/15/2016 at Unknown time  . amLODipine (NORVASC) 10 MG tablet Take 1 tablet (10 mg total) by mouth  daily. 90 tablet 3 11/15/2016 at Unknown time  . aspirin 81 MG tablet Take 1 tablet (81 mg total) by mouth daily.   11/15/2016 at Unknown time  . carvedilol (COREG) 25 MG tablet TAKE TWO TABLETS BY MOUTH TWICE DAILY 120 tablet 3 11/15/2016 at 0700  . clopidogrel (PLAVIX) 75 MG tablet Take 1 tablet (75 mg total) by mouth daily. With breakfast. 90 tablet 1 11/15/2016 at 0700  . diphenhydrAMINE (BENADRYL) 25 mg capsule Take 25 mg by mouth daily as needed for allergies.   Past Month at Unknown time  . diphenhydramine-acetaminophen (TYLENOL PM) 25-500 MG TABS tablet Take 1 tablet by mouth at bedtime.   11/14/2016 at Unknown time  . gabapentin (NEURONTIN) 300 MG capsule Take 300 mg by mouth 3 (three) times daily.   11/15/2016 at Unknown time  . hydrALAZINE (APRESOLINE) 50 MG tablet Take 1 tablet (50 mg total) by mouth 3 (three) times daily. 90 tablet 11 11/15/2016 at Unknown time  . hydrochlorothiazide (HYDRODIURIL) 25 MG tablet Take 25 mg by mouth daily.   11/14/2016 at Unknown time  . isosorbide mononitrate (IMDUR) 120 MG 24 hr tablet Take 2 tablets (240 mg total) by mouth daily. 60 tablet 11 11/15/2016 at Unknown time  . metFORMIN (GLUCOPHAGE) 1000 MG tablet Take 1,000 mg by mouth 2 (two) times daily with a meal.   11/15/2016 at Unknown time  .  Multiple Vitamin (MULTIVITAMIN WITH MINERALS) TABS tablet Take 1 tablet by mouth daily.   11/15/2016 at Unknown time  . nitroGLYCERIN (NITROLINGUAL) 0.4 MG/SPRAY spray Place 1 spray under the tongue every 5 (five) minutes x 3 doses as needed for chest pain. 12 g 12 unk at unk  . Omega-3 Fatty Acids (FISH OIL) 1200 MG CAPS Take 1,200 mg by mouth daily.    Past Week at Unknown time  . pantoprazole (PROTONIX) 40 MG tablet Take 40 mg by mouth daily.    11/15/2016 at Unknown time  . pravastatin (PRAVACHOL) 80 MG tablet Take 80 mg by mouth daily.   11/14/2016 at Unknown time  . traMADol (ULTRAM) 50 MG tablet Take 50 mg by mouth every 8 (eight) hours as needed for moderate pain.   Past Week at  Unknown time  . vitamin E 400 UNIT capsule Take 400 Units by mouth daily.   11/15/2016 at Unknown time  . zolpidem (AMBIEN) 10 MG tablet Take 10 mg by mouth at bedtime as needed for sleep.   Past Month at Unknown time  . atorvastatin (LIPITOR) 80 MG tablet Take 1 tablet (80 mg total) by mouth daily. 90 tablet 3   . ranolazine (RANEXA) 500 MG 12 hr tablet Take 1 tablet (500 mg total) by mouth 2 (two) times daily. (Patient not taking: Reported on 08/16/2016) 60 tablet 11 Not Taking at Unknown time   Scheduled:  . amLODipine  10 mg Oral Daily  . [START ON 11/16/2016] aspirin EC  81 mg Oral Daily  . carvedilol  50 mg Oral BID  . clopidogrel  75 mg Oral Daily  . gabapentin  300 mg Oral TID  . heparin  4,000 Units Intravenous Once  . hydrALAZINE  50 mg Oral TID  . hydrochlorothiazide  25 mg Oral Daily  . multivitamin with minerals  1 tablet Oral Daily  . omega-3 acid ethyl esters  1 g Oral BID  . pantoprazole  40 mg Oral Daily  . pravastatin  80 mg Oral Daily  . ranolazine  500 mg Oral BID  . [START ON 11/16/2016] vitamin E  400 Units Oral Daily   Infusions:  . heparin    . nitroGLYCERIN 5 mcg/min (11/15/16 2057)   PRN: acetaminophen, diphenhydrAMINE, nitroGLYCERIN, ondansetron (ZOFRAN) IV, traMADol, zolpidem  Assessment: Patient is a 53 yom admitted 3/8 with chest pain. No anticoagulation prior to admission.   Baseline CBC w/ hemoglobin low at 10.1 and platelet count within normal limits. No bleeding noted.   Goal of Therapy:  Heparin level 0.3-0.7 units/ml Monitor platelets by anticoagulation protocol: Yes   Plan:  Give 4000 units bolus x 1 Start heparin infusion at 1200 units/hr Check anti-Xa level in 6 hours and daily while on heparin Continue to monitor H&H and platelets  F/U cards plan  Delane Ginger 11/15/2016,8:00 PM

## 2016-11-15 NOTE — ED Triage Notes (Signed)
Pt reports intermittent chest pain in the left side of his chest. Pt reports he has used his nitro spray x5. Pt cp is 10/10.

## 2016-11-15 NOTE — ED Notes (Signed)
Dinner tray ordered for pt

## 2016-11-15 NOTE — ED Notes (Signed)
Pt in X-Ray. X-Ray to transport pt to room.

## 2016-11-15 NOTE — H&P (Signed)
Patient ID: Matthew Kirks Sr. MRN: [id number], DOB/AGE: 57-Jun-1962   Admit date: 11/15/2016  Requesting Physician: Sula Rumple, PA-C Primary Physician: Barrie Lyme, FNP Primary Cardiologist: Dr. Saunders Revel, C  Reason for admission: Chest pain  HPI: Matthew Rowe. is a 56 y/o male with PMH of CAD s/p CABG (RIMA to RCA) with multiple PCI's and recurrent angina, mild aortic stenosis (mean gradient 14 mm/Hg), DM, HTN, hyperlipidemia, CKD, gout who presents for follow-up with chest pain and hypertension.    Patient was previously on Ranexa for chronic chest pains but was not able to afford it anymore, therefore his Imdur dose instead in November 2017 when he was admitted for chest pain. He did not present to his two week follow-up appontment. He says he has continued to have problems since then with chest pains because the Imdur is not working. He decided to come to the ER for this episode of chest pain because it "just kept coming". It was a sharp/pressure severe 10/10 pain to the left chest. Happening during exertion or at rest, improving with nitro. Some SOB, no N/V or diaphoresis. Wife reports when this happens they typically come to get "checked out". Patient continues to have pain in ED, appears in no distress to me.  >>>>>>>Cath/PCI:  LHC (12/02/13): LMCA with 30% distal stenosis. LAD with multiple stents and 50% ostial stenosis followed by 20% diffuse in-stent restenosis proximally. There is diffuse 50% in-stent restenosis in the mid and distal segments. The distal LAD is occluded. LCx is small with 4050% ostial stenosis. OM 2 has a 50% ostial stenosis. Ramus intermedius is a large vessel with 70-80% stenosis involving its superior branch. 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 There is diffuse 20% proximal disease. There is a 90% stenosis involving the distal vessel just beyond the anastomosis with the RIMA. 60% stenosis extending into the RPDA is also evident. RIMA  to RCA is widely patent with aforementioned stenosis just beyond the distal anastomosis.  PCI (12/03/13): Successful PCI to the mid/distal RCA with overlapping Promus drug-eluting stents (2.25 x 24 and 2.25 x 16 mm).   Problem List  Past Medical History:  Diagnosis Date  . Anginal pain ([place])   . CKD (chronic kidney disease), stage III   . Coronary artery disease    a. INF MI 1999: PCI of RCA;  b.  extensive stenting of LAD;  c. s/p CABG with RIMA->RCA in 2006;  d. Stable, Low risk MV 05/2012; e. 09/2012 Cath: stable anatomy->med Rx. f. Abnl nuc 11/2013 -> med rx; g. 11/2013 Cath/PCI: LM 30d, LAD 50ost, 20/50isr, 100d, LCX 40-50ost, OM2 50ost, RI 70-80ost sup branch, RCA 20p, 90/60d(2.25x17 & 2.25x24 Promus DES'), PDA 60p, RIMA->RCA ok.  . DM2 (diabetes mellitus, type 2) ([place])   . Dyslipidemia   . GERD (gastroesophageal reflux disease)   . History of gout   . Hx of echocardiogram    a. Echo 3/12: mild LVH, EF 55-60%, trivial AI  . Hypertensive heart disease   . Myocardial infarction 1999; 2002; 2003; 2006; ~ 2008  . OSA on CPAP   . Polycystic kidney disease     Past Surgical History:  Procedure Laterality Date  . CARDIAC CATHETERIZATION  2012  . CARDIAC CATHETERIZATION  2013  . CARDIAC CATHETERIZATION  2014   LHC (1/14): total occlusion of distal LAD, diffuse disease in ramus, 70% proximal LCx, 50% proximal and mid RCA, 50% distal RCA, patent RIMA-RCA.  Marland Kitchen CORONARY  Why / 2002 / 2004 / 2006?  . CORONARY ARTERY BYPASS GRAFT  2006   CABG X?; in [place]  . CYSTECTOMY  1990's   off face  . LEFT HEART CATHETERIZATION WITH CORONARY/GRAFT ANGIOGRAM N/A 09/16/2012   Procedure: LEFT HEART CATHETERIZATION WITH [name];  Surgeon: [name], MD;  Location: [facility] CATH LAB;  Service: Cardiovascular;  Laterality: N/A;  . LEFT HEART CATHETERIZATION WITH CORONARY/GRAFT ANGIOGRAM N/A 12/02/2013   Procedure: LEFT HEART CATHETERIZATION WITH [name];  Surgeon: [name], MD;  Location: [facility] CATH LAB;  Service: Cardiovascular;  Laterality: N/A;  . PERCUTANEOUS CORONARY STENT INTERVENTION (PCI-S) N/A 12/03/2013   Procedure: PERCUTANEOUS CORONARY STENT INTERVENTION (PCI-S);  Surgeon: [name], MD;  Location: [facility] CATH LAB;  Service: Cardiovascular;  Laterality: N/A;     Allergies  Allergies  Allergen Reactions  . Ciprocin-Fluocin-Procin [Fluocinolone Acetonide] Rash  . Clarithromycin Itching and Other (See Comments)    "Biaxin" Eyes itch and burn  . Cleocin [Clindamycin Hcl] Anaphylaxis and Swelling  . Glimepiride [Amaryl] Other (See Comments)    Elevates liver function      Home Medications  Prior to Admission medications   Medication Sig Start Date End Date Taking? Authorizing Provider  acetaminophen (TYLENOL) 500 MG tablet Take 1,000 mg by mouth every 8 (eight) hours as needed for mild pain.    Yes Historical Provider, MD  amLODipine (NORVASC) 10 MG tablet Take 1 tablet (10 mg total) by mouth daily. 01/12/15  Yes Gay Filler Copland, MD  aspirin 81 MG tablet Take 1 tablet (81 mg total) by mouth daily. 12/04/13  Yes Rogelia Mire, NP  carvedilol (COREG) 25 MG tablet TAKE TWO TABLETS BY MOUTH TWICE DAILY 09/28/16  Yes Nelva Bush, MD  clopidogrel (PLAVIX) 75 MG tablet Take 1 tablet (75 mg total) by mouth daily. With breakfast. 01/23/16  Yes Larey Dresser, MD  diphenhydrAMINE (BENADRYL) 25 mg capsule Take 25 mg by mouth daily as needed for allergies.   Yes Historical Provider, MD  diphenhydramine-acetaminophen (TYLENOL PM) 25-500 MG TABS tablet Take 1 tablet by mouth at bedtime.   Yes Historical Provider, MD  gabapentin (NEURONTIN) 300 MG capsule Take 300 mg by mouth 3 (three) times daily.   Yes Historical Provider, MD  hydrALAZINE (APRESOLINE) 50 MG tablet Take 1 tablet (50 mg total) by mouth 3 (three) times daily. 06/07/16  Yes Christopher End, MD  hydrochlorothiazide (HYDRODIURIL) 25 MG tablet Take 25  mg by mouth daily.   Yes Historical Provider, MD  isosorbide mononitrate (IMDUR) 120 MG 24 hr tablet Take 2 tablets (240 mg total) by mouth daily. 08/11/16  Yes Luke K Kilroy, PA-C  metFORMIN (GLUCOPHAGE) 1000 MG tablet Take 1,000 mg by mouth 2 (two) times daily with a meal.   Yes Historical Provider, MD  Multiple Vitamin (MULTIVITAMIN WITH MINERALS) TABS tablet Take 1 tablet by mouth daily.   Yes Historical Provider, MD  nitroGLYCERIN (NITROLINGUAL) 0.4 MG/SPRAY spray Place 1 spray under the tongue every 5 (five) minutes x 3 doses as needed for chest pain. 01/23/16  Yes Larey Dresser, MD  Omega-3 Fatty Acids (FISH OIL) 1200 MG CAPS Take 1,200 mg by mouth daily.  02/03/15  Yes Larey Dresser, MD  pantoprazole (PROTONIX) 40 MG tablet Take 40 mg by mouth daily.    Yes Historical Provider, MD  pravastatin (PRAVACHOL) 80 MG tablet Take 80 mg by mouth daily.   Yes Historical Provider, MD  traMADol Veatrice Bourbon)  50 MG tablet Take 50 mg by mouth every 8 (eight) hours as needed for moderate pain.   Yes Historical Provider, MD  vitamin E 400 UNIT capsule Take 400 Units by mouth daily.   Yes Historical Provider, MD  zolpidem (AMBIEN) 10 MG tablet Take 10 mg by mouth at bedtime as needed for sleep.   Yes Historical Provider, MD  atorvastatin (LIPITOR) 80 MG tablet Take 1 tablet (80 mg total) by mouth daily. 08/16/16 11/14/16  Nelva Bush, MD  ranolazine (RANEXA) 500 MG 12 hr tablet Take 1 tablet (500 mg total) by mouth 2 (two) times daily. Patient not taking: Reported on 08/16/2016 12/14/15   Larey Dresser, MD    Family History  Family History  Problem Relation Age of Onset  . Lung cancer Mother   . Anemia Mother   . Polycystic kidney disease Mother   . Diabetes Father   . Heart attack Father 61    Died suddenly  . Heart failure Father   . Hyperlipidemia Father   . Hypertension Father   . Kidney failure Sister 5    Died  . Heart attack Sister   . Hypertension Sister   . Diabetes Sister   . Diabetes  Brother   . Polycystic kidney disease Brother   . Diabetes Sister    Family Status  Relation Status  . Mother Deceased at age 34  . Father Deceased at age 13  . Sister Deceased at age 21   kidney failure  . Sister Deceased at age 69   MI  . Sister Alive  . Brother Alive  . Brother Alive  . Daughter Alive  . Son Alive  . Maternal Grandmother Deceased  . Maternal Grandfather Deceased  . Paternal Grandmother Deceased  . Paternal Grandfather Deceased  . Daughter Alive  . Son Alive  . Son Alive  . Sister   . Sister   . Brother   . Brother   . Sister      Social History  Social History   Social History  . Marital status: Married    Spouse name: [name]  . Number of children: 5  . Years of education: N/A   Occupational History  . retired     Chartered loss adjuster   Social History Main Topics  . Smoking status: Never Smoker  . Smokeless tobacco: Never Used     Comment: smoked some as a teenager (high school)  . Alcohol use No     Comment: 09/15/2012 "drank a little when I was young"  . Drug use: No  . Sexual activity: Yes   Other Topics Concern  . Not on file   Social History Narrative   Married and lives with wife in [place]. On disability.    Consumes about 2 Coke Zeros a day      Review of Systems General:  No chills, fever, night sweats or weight changes.  Cardiovascular:  No dyspnea on exertion, edema, orthopnea, palpitations, paroxysmal nocturnal dyspnea. Dermatological: No rash, lesions/masses Respiratory: No cough, dyspnea Urologic: No hematuria, dysuria Abdominal:   No nausea, vomiting, diarrhea, bright red blood per rectum, melena, or hematemesis Neurologic:  No visual changes, wkns, changes in mental status. All other systems reviewed and are otherwise negative except as noted above.  Physical Exam  Blood pressure 141/78, pulse 84, temperature 98 F (36.7 C), temperature source Oral, resp. rate (!) 28, SpO2 100 %.  General: Pleasant, NAD Psych:  Normal affect. Neuro: Alert and oriented X  3. Moves all extremities spontaneously. HEENT: Normal  Neck: Supple without bruits or JVD. Lungs:  Resp regular and unlabored, CTA. Heart: RRR no s3, s4, or murmurs. +chest wall tenderness to palpation. Abdomen: Soft, non-tender, non-distended, BS + x 4.  Extremities: No clubbing, cyanosis or edema. DP/PT/Radials 2+ and equal bilaterally.  Labs  No results for input(s): CKTOTAL, CKMB, TROPONINI in the last 72 hours. Lab Results  Component Value Date   WBC 7.6 11/15/2016   HGB 10.1 (L) 11/15/2016   HCT 31.5 (L) 11/15/2016   MCV 88.0 11/15/2016   PLT 212 11/15/2016    Recent Labs Lab 11/12/16 1341 11/15/16 1419  NA  --  143  K  --  4.8  CL  --  117*  CO2  --  16*  BUN  --  30*  CREATININE  --  2.42*  CALCIUM  --  9.3  ALT 8  --   GLUCOSE  --  143*   Lab Results  Component Value Date   CHOL 161 11/12/2016   HDL 31 (L) 11/12/2016   LDLCALC 86 11/12/2016   TRIG 219 (H) 11/12/2016    Transthoracic Echocardiogram 01/02/2015  Study Conclusions  - Left ventricle: The cavity size was normal. Wall thickness was   increased in a pattern of mild LVH. Systolic function was normal.   The estimated ejection fraction was in the range of 50% to 55%.   Wall motion was normal; there were no regional wall motion   abnormalities. Doppler parameters are consistent with abnormal   left ventricular relaxation (grade 1 diastolic dysfunction). - Aortic valve: There was very mild stenosis. There was mild   regurgitation. Valve area (VTI): 1.7 cm^2. Valve area (Vmax):   1.61 cm^2. Valve area (Vmean): 1.54 cm^2. - Mitral valve: Calcified annulus. Mildly thickened leaflets .   There was mild regurgitation.  Cath/PCI:  LHC (12/02/13): LMCA with 30% distal stenosis. LAD with multiple stents and 50% ostial stenosis followed by 20% diffuse in-stent restenosis proximally. There is diffuse 50% in-stent restenosis in the mid and distal segments. The  distal LAD is occluded. LCx is small with 4050% ostial stenosis. OM 2 has a 50% ostial stenosis. Ramus intermedius is a large vessel with 70-80% stenosis involving its superior branch. 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 There is diffuse 20% proximal disease. There is a 90% stenosis involving the distal vessel just beyond the anastomosis with the RIMA. 60% stenosis extending into the RPDA is also evident. RIMA to RCA is widely patent with aforementioned stenosis just beyond the distal anastomosis.  PCI (12/03/13): Successful PCI to the mid/distal RCA with overlapping Promus drug-eluting stents (2.25 x 24 and 2.25 x 16 mm).   Radiology/Studies  Dg Chest 2 View Result Date: 11/15/2016   IMPRESSION: No active cardiopulmonary disease. Electronically Signed   By: Marijo Conception, M.D.   On: 11/15/2016 14:42    ECG HR 92, NSR, left anterior fascicular block - reviewed personally.  ASSESSMENT AND PLAN  Coronary artery disease with chronic chest pain Most recent cath in 2015, he was doing well on Ranexa but was unable to get insurance coverage for this and his dose of Imdur was increased, it sounds like he was tolerating this well but has been having increasing issues. First troponin is negative. -   Will cycle Troponins and start on Heparin -   Needs Nephrology consult in the AM, Creatinine < 2.42 --- will need eventual cath -  Start IV Nitro, IV heparin and Ranexa 500 mg BID.  -   Case Manager consulted -   Dc Imdur.  Hyperlipidemia On Atorvastatin 80 mg daily,  LDL 86, Triglycerides 219, cholesterol 161  (11/12/16)   Signed, Kasiah Manka G, PA-C 11/15/2016, 4:52 PM

## 2016-11-16 ENCOUNTER — Inpatient Hospital Stay (HOSPITAL_BASED_OUTPATIENT_CLINIC_OR_DEPARTMENT_OTHER): Payer: Medicare Other

## 2016-11-16 ENCOUNTER — Other Ambulatory Visit: Payer: Self-pay | Admitting: Physician Assistant

## 2016-11-16 ENCOUNTER — Encounter (HOSPITAL_COMMUNITY): Payer: Self-pay | Admitting: Physician Assistant

## 2016-11-16 DIAGNOSIS — I200 Unstable angina: Secondary | ICD-10-CM | POA: Diagnosis not present

## 2016-11-16 DIAGNOSIS — E785 Hyperlipidemia, unspecified: Secondary | ICD-10-CM

## 2016-11-16 DIAGNOSIS — I25110 Atherosclerotic heart disease of native coronary artery with unstable angina pectoris: Secondary | ICD-10-CM | POA: Diagnosis not present

## 2016-11-16 DIAGNOSIS — I429 Cardiomyopathy, unspecified: Secondary | ICD-10-CM

## 2016-11-16 DIAGNOSIS — I10 Essential (primary) hypertension: Secondary | ICD-10-CM | POA: Diagnosis not present

## 2016-11-16 DIAGNOSIS — G8929 Other chronic pain: Secondary | ICD-10-CM | POA: Diagnosis not present

## 2016-11-16 DIAGNOSIS — R079 Chest pain, unspecified: Secondary | ICD-10-CM

## 2016-11-16 DIAGNOSIS — I252 Old myocardial infarction: Secondary | ICD-10-CM | POA: Diagnosis not present

## 2016-11-16 DIAGNOSIS — I2510 Atherosclerotic heart disease of native coronary artery without angina pectoris: Secondary | ICD-10-CM | POA: Diagnosis not present

## 2016-11-16 LAB — NM MYOCAR MULTI W/SPECT W/WALL MOTION / EF
CHL CUP MPHR: 164 {beats}/min
CSEPED: 5 min
CSEPEW: 1 METS
CSEPHR: 59 %
CSEPPHR: 97 {beats}/min
Rest HR: 81 {beats}/min

## 2016-11-16 LAB — CBC
HEMATOCRIT: 29.9 % — AB (ref 39.0–52.0)
Hemoglobin: 9.7 g/dL — ABNORMAL LOW (ref 13.0–17.0)
MCH: 28.6 pg (ref 26.0–34.0)
MCHC: 32.4 g/dL (ref 30.0–36.0)
MCV: 88.2 fL (ref 78.0–100.0)
PLATELETS: 209 10*3/uL (ref 150–400)
RBC: 3.39 MIL/uL — ABNORMAL LOW (ref 4.22–5.81)
RDW: 13.5 % (ref 11.5–15.5)
WBC: 7.6 10*3/uL (ref 4.0–10.5)

## 2016-11-16 LAB — PROTIME-INR
INR: 1.07
Prothrombin Time: 14 seconds (ref 11.4–15.2)

## 2016-11-16 LAB — BASIC METABOLIC PANEL
Anion gap: 8 (ref 5–15)
BUN: 25 mg/dL — AB (ref 6–20)
CHLORIDE: 114 mmol/L — AB (ref 101–111)
CO2: 18 mmol/L — AB (ref 22–32)
CREATININE: 2.14 mg/dL — AB (ref 0.61–1.24)
Calcium: 9.2 mg/dL (ref 8.9–10.3)
GFR calc Af Amer: 38 mL/min — ABNORMAL LOW (ref >60)
GFR calc non Af Amer: 33 mL/min — ABNORMAL LOW (ref >60)
GLUCOSE: 111 mg/dL — AB (ref 65–99)
POTASSIUM: 4.5 mmol/L (ref 3.5–5.1)
Sodium: 140 mmol/L (ref 135–145)

## 2016-11-16 LAB — GLUCOSE, CAPILLARY
Glucose-Capillary: 105 mg/dL — ABNORMAL HIGH (ref 65–99)
Glucose-Capillary: 187 mg/dL — ABNORMAL HIGH (ref 65–99)
Glucose-Capillary: 129 mg/dL — ABNORMAL HIGH (ref 65–99)

## 2016-11-16 LAB — HEPARIN LEVEL (UNFRACTIONATED)
HEPARIN UNFRACTIONATED: 0.56 [IU]/mL (ref 0.30–0.70)
Heparin Unfractionated: 0.64 IU/mL (ref 0.30–0.70)

## 2016-11-16 LAB — HIV ANTIBODY (ROUTINE TESTING W REFLEX): HIV Screen 4th Generation wRfx: NONREACTIVE

## 2016-11-16 LAB — TROPONIN I: Troponin I: <0.03 ng/mL (ref <0.03)

## 2016-11-16 LAB — RPR: RPR Ser Ql: NONREACTIVE

## 2016-11-16 MED ORDER — ATORVASTATIN CALCIUM 80 MG PO TABS: 80.0000 mg | ORAL_TABLET | Freq: Every day | ORAL | 1 refills | Status: DC

## 2016-11-16 MED ORDER — ISOSORBIDE MONONITRATE ER 60 MG PO TB24
240.0000 mg | ORAL_TABLET | Freq: Every day | ORAL | Status: DC
  Administered 2016-11-16: 240 mg via ORAL
  Filled 2016-11-16: qty 4

## 2016-11-16 MED ORDER — RANOLAZINE ER 500 MG PO TB12: 500.0000 mg | ORAL_TABLET | Freq: Two times a day (BID) | ORAL | 11 refills | Status: DC

## 2016-11-16 MED ORDER — COLCHICINE 0.6 MG PO TABS
0.6000 mg | ORAL_TABLET | Freq: Every day | ORAL | Status: DC
  Administered 2016-11-16: 0.6 mg via ORAL
  Filled 2016-11-16: qty 1

## 2016-11-16 MED ORDER — TECHNETIUM TC 99M TETROFOSMIN IV KIT
30.0000 | PACK | Freq: Once | INTRAVENOUS | Status: AC | PRN
  Administered 2016-11-16: 30 via INTRAVENOUS

## 2016-11-16 MED ORDER — HEPARIN SODIUM (PORCINE) 5000 UNIT/ML IJ SOLN
5000.0000 [IU] | Freq: Three times a day (TID) | INTRAMUSCULAR | Status: DC
  Filled 2016-11-16: qty 1

## 2016-11-16 MED ORDER — TECHNETIUM TC 99M TETROFOSMIN IV KIT
10.0000 | PACK | Freq: Once | INTRAVENOUS | Status: AC | PRN
  Administered 2016-11-16: 10 via INTRAVENOUS

## 2016-11-16 MED ORDER — REGADENOSON 0.4 MG/5ML IV SOLN
INTRAVENOUS | Status: AC
  Administered 2016-11-16: 0.4 mg via INTRAVENOUS
  Filled 2016-11-16: qty 5

## 2016-11-16 MED ORDER — NITROGLYCERIN 0.4 MG SL SUBL
SUBLINGUAL_TABLET | SUBLINGUAL | Status: AC
  Filled 2016-11-16: qty 1

## 2016-11-16 MED ORDER — AMINOPHYLLINE 25 MG/ML IV SOLN
INTRAVENOUS | Status: AC
  Administered 2016-11-16: 250 mg
  Filled 2016-11-16: qty 10

## 2016-11-16 MED ORDER — ATORVASTATIN CALCIUM 80 MG PO TABS
80.0000 mg | ORAL_TABLET | Freq: Every day | ORAL | Status: DC
  Filled 2016-11-16: qty 1

## 2016-11-16 MED ORDER — COLCHICINE 0.6 MG PO TABS: 0.6000 mg | ORAL_TABLET | Freq: Every day | ORAL | 1 refills | Status: DC

## 2016-11-16 MED ORDER — INSULIN ASPART 100 UNIT/ML ~~LOC~~ SOLN
0.0000 [IU] | Freq: Three times a day (TID) | SUBCUTANEOUS | Status: DC
  Administered 2016-11-16: 2 [IU] via SUBCUTANEOUS

## 2016-11-16 MED ORDER — REGADENOSON 0.4 MG/5ML IV SOLN
0.4000 mg | Freq: Once | INTRAVENOUS | Status: AC
  Administered 2016-11-16: 0.4 mg via INTRAVENOUS
  Filled 2016-11-16: qty 5

## 2016-11-16 NOTE — Care Management Obs Status (Signed)
[facility] NOTIFICATION   Patient Details  Name: SAHAJ BONA Sr. MRN: [id number] Date of Birth: Sep 26, 1960   Medicare Observation Status Notification Given:  Yes    Bethena Roys, RN 11/16/2016, 3:31 PM

## 2016-11-16 NOTE — Progress Notes (Signed)
Pt hd been complaining of R foot pain since last night and it has not let up. Pt claims he has gout on his foot and has not taken his gout medicine x 3 months ( Uloric acid and colchicine) and now it may have flared up. Kindly address. thanks

## 2016-11-16 NOTE — Care Management Obs Status (Signed)
[facility] NOTIFICATION   Patient Details  Name: Matthew SOMERA Sr. MRN: [id number] Date of Birth: 1960-12-29   Medicare Observation Status Notification Given:  Yes    Bethena Roys, RN 11/16/2016, 3:30 PM

## 2016-11-16 NOTE — Discharge Summary (Signed)
Discharge Summary    Patient ID: Matthew Matt.,  MRN: [id number], DOB/AGE: 57-24-1962 57 y.o.  Admit date: 11/15/2016 Discharge date: 11/16/2016  Primary Care Provider: Barrie Lyme Primary Cardiologist: Dr. Saunders Revel   Discharge Diagnoses    Principal Problem:   Chest pain Active Problems:   Dyslipidemia   Essential hypertension   OSA on CPAP   Obesity-BMI 42   Type 2 diabetes mellitus with renal manifestations ([place])   CAD- CABG 2006, RCA DES March 2015   GERD (gastroesophageal reflux disease)   CRI (chronic renal insufficiency), stage 3 (moderate)   Other hyperlipidemia   Stage 3 chronic kidney disease   Allergies Allergies  Allergen Reactions  . Ciprocin-Fluocin-Procin [Fluocinolone Acetonide] Rash  . Clarithromycin Itching and Other (See Comments)    "Biaxin" Eyes itch and burn  . Cleocin [Clindamycin Hcl] Anaphylaxis and Swelling  . Glimepiride [Amaryl] Other (See Comments)    Elevates liver function      History of Present Illness     Matthew Rowe. is a 56 y.o. male with a history of CAD s/p CABG with RIMA to RCA (2006) and multiple PCIs, chronic anginal pain on Ranexa, CKD stage III, HTN, HLD, and DMT2 who presented to [facility] on 11/15/16 with chest pain.   His last cath was on 12/04/2015 showing 30% dLM, diffuse instent restenosis of the mid and distal LAD up to 50% and occluded LAD distally, 50% small LCx, 50% ostial OM2, 70-80% large RI in the superior branch, anomalous take-off of L coronary cusp and 90% stenosis of the distal RCA beyond anastamosis of the RIMA, 60% RPDA with patent RIMA to RCA and underwent PCI of the mid and distal RCA.   He has chronic angina from presumed small vessel disease as well as residual disease of the RI 70-80% and occluded distal LAD. He has been on high dose long acting nitrates as he has not been able to afford his Ranexa and unable to get insurance coverage.  He was admitted in 07/2016 for chest pain after running  out of Ranexa. Imdur was increased and he was discharged home. He was working with insurance company to try to get Ranexa. He has continued to have chest pain since that time and finally returned to the [facility] ER on 11/15/16 for recurrent chest pain.   Hospital Course     Consultants: case management  Coronary artery disease with chronic chest pain: he ruled out for MI. No further CP since admission.   -- Case Manager consulted who found that Ranexa is now a Tier 3 drug and will be $45/ month which is affordable to him now. He was restarted on Imdur 240mg  daily and will resume Ranexa 500mg  BID at discharge -- He underwent nuclear stress test today to rule out ischemia in inferior wall. This showed no inducible ischemia but did show new LV dysfunction. Since no evidence of ischemia on nuc will treat medically and avoid cath given CKD. I have ordered an outpatient echocardiogram to be done on 11/19/16 for further evaluation of LV function.   Hyperlipidemia: on Pravastatin 80 mg daily.LDL 86, Triglycerides 219, cholesterol 161 (11/12/16).  LDL goal < 70.  He was supposed to be changed to Atorvastatin but says that the Rx never got called in.   -- Stop Pravastatin and start atorvastatin 80mg  daily.  -- Will need FLP and ALT in 6 weeks.  ASCAD: Continue ASA/statin/Ranexa/long acting nitrates/BB/Plavix  HTN: BP controlled on current  meds. Continue amlodipine/BB/hydralazine/diuretic  CKD stage 3: creatinine stable at baseline ~ 2.14   Type 2 DM: continue current regimen  Gout: he asked to be put on colchicine for his gout. He would also like to be on uloric, which I will defer to his PCP.  The patient has had an uncomplicated hospital course and is recovering well. He has been seen by Dr. Radford Pax today and deemed ready for discharge home. All follow-up appointments have been scheduled.  Discharge medications are listed below.  _____________  Discharge Vitals Blood pressure 131/77, pulse 74,  temperature 97.5 F (36.4 C), temperature source Oral, resp. rate 16, height 5' 5.5" (1.664 m), weight 234 lb 9.6 oz (106.4 kg), SpO2 98 %.  Filed Weights   11/15/16 2000 11/15/16 2045  Weight: 230 lb (104.3 kg) 234 lb 9.6 oz (106.4 kg)    Labs & Radiologic Studies     CBC  Recent Labs  11/15/16 1419 11/16/16 0732  WBC 7.6 7.6  HGB 10.1* 9.7*  HCT 31.5* 29.9*  MCV 88.0 88.2  PLT 212 450   Basic Metabolic Panel  Recent Labs  11/15/16 1419 11/16/16 0732  NA 143 140  K 4.8 4.5  CL 117* 114*  CO2 16* 18*  GLUCOSE 143* 111*  BUN 30* 25*  CREATININE 2.42* 2.14*  CALCIUM 9.3 9.2   Liver Function Tests No results for input(s): AST, ALT, ALKPHOS, BILITOT, PROT, ALBUMIN in the last 72 hours. No results for input(s): LIPASE, AMYLASE in the last 72 hours. Cardiac Enzymes  Recent Labs  11/15/16 2111 11/16/16 0304 11/16/16 0732  TROPONINI <0.03 <0.03 <0.03   BNP Invalid input(s): POCBNP D-Dimer No results for input(s): DDIMER in the last 72 hours. Hemoglobin A1C No results for input(s): HGBA1C in the last 72 hours. Fasting Lipid Panel No results for input(s): CHOL, HDL, LDLCALC, TRIG, CHOLHDL, LDLDIRECT in the last 72 hours. Thyroid Function Tests No results for input(s): TSH, T4TOTAL, T3FREE, THYROIDAB in the last 72 hours.  Invalid input(s): FREET3  Dg Chest 2 View  Result Date: 11/15/2016 CLINICAL DATA:  Chest pain. EXAM: CHEST  2 VIEW COMPARISON:  Radiographs of August 09, 2016. FINDINGS: The heart size and mediastinal contours are within normal limits. Both lungs are clear. No pneumothorax or pleural effusion is noted. Sternotomy wires are noted. The visualized skeletal structures are unremarkable. IMPRESSION: No active cardiopulmonary disease. Electronically Signed   By: Marijo Conception, M.D.   On: 11/15/2016 14:42   Nm Myocar Multi W/spect W/wall Motion / Ef  Result Date: 11/16/2016 CLINICAL DATA:  56 year old male with abnormal EKG EXAM: MYOCARDIAL  IMAGING WITH SPECT (REST AND PHARMACOLOGIC-STRESS) GATED LEFT VENTRICULAR WALL MOTION STUDY LEFT VENTRICULAR EJECTION FRACTION TECHNIQUE: Standard myocardial SPECT imaging was performed after resting intravenous injection of 10 mCi Tc-8m tetrofosmin. Subsequently, intravenous infusion of Lexiscan was performed under the supervision of the Cardiology staff. At peak effect of the drug, 30 mCi Tc-40m tetrofosmin was injected intravenously and standard myocardial SPECT imaging was performed. Quantitative gated imaging was also performed to evaluate left ventricular wall motion, and estimate left ventricular ejection fraction. COMPARISON:  Chest x-ray 11/15/2016 nuclear medicine cardiac stress test 11/28/2013 FINDINGS: Perfusion: Large fixed defect affecting the inferior wall from the base through the mid ventricle, apical ventricle and through the true left ventricular apex. No evidence of reversible ischemia. Wall Motion: Positive for left ventricular dilatation of moderate severity. There is general hypokinesis with near akinesis of the inferior wall. Left Ventricular Ejection Fraction: 35 %  End diastolic volume 250 ml End systolic volume 037 ml IMPRESSION: 1. Large fixed defect involving the basilar, mid and apical inferior wall with extension to the true cardiac apex consistent with a region of prior infarct or scarring. 2. Dilated left ventricle with associated hypokinesis and near akinesis of the inferior wall. 3. Left ventricular ejection fraction 35% which is significantly worse than 54% recorded on 11/28/2013. 4. Non invasive risk stratification*: High *2012 Appropriate Use Criteria for Coronary Revascularization Focused Update: J Am Coll Cardiol. 0488;89(1):694-503. http://content.airportbarriers.com.aspx?articleid=[email or web address] Electronically Signed   By: Jacqulynn Cadet M.D.   On: 11/16/2016 14:56     Diagnostic Studies/Procedures    Nuclear stress test. 11/16/16  IMPRESSION: 1. Large fixed defect  involving the basilar, mid and apical inferior wall with extension to the true cardiac apex consistent with a region of prior infarct or scarring.  2. Dilated left ventricle with associated hypokinesis and near akinesis of the inferior wall.  3. Left ventricular ejection fraction 35% which is significantly worse than 54% recorded on 11/28/2013.  4. Non invasive risk stratification*: High _____________    Disposition   Pt is being discharged home today in good condition.  Follow-up Plans & Appointments    Follow-up Information    [facility] Follow up on 11/19/2016.   Why:  @ 9:30am for an ultrasound of his heart. Contact information: [place] [address] [phone]       Nelva Bush, MD Follow up on 11/30/2016.   Specialty:  Cardiology Why:  @ 3pm  Contact information: [place] [address] [phone]            Discharge Medications     Medication List    STOP taking these medications   pravastatin 80 MG tablet Commonly known as:  PRAVACHOL     TAKE these medications   acetaminophen 500 MG tablet Commonly known as:  TYLENOL Take 1,000 mg by mouth every 8 (eight) hours as needed for mild pain.   amLODipine 10 MG tablet Commonly known as:  NORVASC Take 1 tablet (10 mg total) by mouth daily.   aspirin 81 MG tablet Take 1 tablet (81 mg total) by mouth daily.   atorvastatin 80 MG tablet Commonly known as:  LIPITOR Take 1 tablet (80 mg total) by mouth daily.   carvedilol 25 MG tablet Commonly known as:  COREG TAKE TWO TABLETS BY MOUTH TWICE DAILY   clopidogrel 75 MG tablet Commonly known as:  PLAVIX Take 1 tablet (75 mg total) by mouth daily. With breakfast.   colchicine 0.6 MG tablet Take 1 tablet (0.6 mg total) by mouth daily. Start taking on:  11/17/2016   diphenhydrAMINE 25 mg capsule Commonly known as:   BENADRYL Take 25 mg by mouth daily as needed for allergies.   diphenhydramine-acetaminophen 25-500 MG Tabs tablet Commonly known as:  TYLENOL PM Take 1 tablet by mouth at bedtime.   Fish Oil 1200 MG Caps Take 1,200 mg by mouth daily.   gabapentin 300 MG capsule Commonly known as:  NEURONTIN Take 300 mg by mouth 3 (three) times daily.   hydrALAZINE 50 MG tablet Commonly known as:  APRESOLINE Take 1 tablet (50 mg total) by mouth 3 (three) times daily.   hydrochlorothiazide 25 MG tablet Commonly known as:  HYDRODIURIL Take 25 mg by mouth daily.   isosorbide mononitrate 120 MG 24 hr tablet Commonly known as:  IMDUR Take 2 tablets (240  mg total) by mouth daily.   metFORMIN 1000 MG tablet Commonly known as:  GLUCOPHAGE Take 1,000 mg by mouth 2 (two) times daily with a meal.   multivitamin with minerals Tabs tablet Take 1 tablet by mouth daily.   nitroGLYCERIN 0.4 MG/SPRAY spray Commonly known as:  NITROLINGUAL Place 1 spray under the tongue every 5 (five) minutes x 3 doses as needed for chest pain.   pantoprazole 40 MG tablet Commonly known as:  PROTONIX Take 40 mg by mouth daily.   ranolazine 500 MG 12 hr tablet Commonly known as:  RANEXA Take 1 tablet (500 mg total) by mouth 2 (two) times daily.   traMADol 50 MG tablet Commonly known as:  ULTRAM Take 50 mg by mouth every 8 (eight) hours as needed for moderate pain.   vitamin E 400 UNIT capsule Take 400 Units by mouth daily.   zolpidem 10 MG tablet Commonly known as:  AMBIEN Take 10 mg by mouth at bedtime as needed for sleep.          Outstanding Labs/Studies   2D ECHO next monday  Duration of Discharge Encounter   Greater than 30 minutes including physician time.  Signed, Angelena Form PA-C 11/16/2016, 5:24 PM

## 2016-11-16 NOTE — Progress Notes (Addendum)
Patient Name: Matthew DEGUIRE Sr. Date of Encounter: 11/16/2016  Primary Cardiologist: Dr. Lorretta Harp Problem List     Active Problems:   Chest pain   Other hyperlipidemia   Stage 3 chronic kidney disease     Subjective   Denies any chest pain or SOB since admission.  Inpatient Medications    Scheduled Meds: . amLODipine  10 mg Oral Daily  . aspirin EC  81 mg Oral Daily  . carvedilol  50 mg Oral BID  . clopidogrel  75 mg Oral Daily  . gabapentin  300 mg Oral TID  . hydrALAZINE  50 mg Oral TID  . hydrochlorothiazide  25 mg Oral Daily  . multivitamin with minerals  1 tablet Oral Daily  . omega-3 acid ethyl esters  1 g Oral BID  . pantoprazole  40 mg Oral Daily  . pravastatin  80 mg Oral Daily  . ranolazine  500 mg Oral BID  . vitamin E  400 Units Oral Daily   Continuous Infusions: . heparin 1,200 Units/hr (11/15/16 2144)  . nitroGLYCERIN 5 mcg/min (11/16/16 0242)   PRN Meds: acetaminophen, diphenhydrAMINE, nitroGLYCERIN, ondansetron (ZOFRAN) IV, traMADol, zolpidem   Vital Signs    Vitals:   11/16/16 0110 11/16/16 0241 11/16/16 0350 11/16/16 0700  BP: 132/70 (!) 123/92 131/69 135/88  Pulse: 86 86 81 80  Resp:    (!) 22  Temp:   98.1 F (36.7 C) 97.8 F (36.6 C)  TempSrc:   Axillary Oral  SpO2: 100%  100% 100%  Weight:      Height:        Intake/Output Summary (Last 24 hours) at 11/16/16 0808 Last data filed at 11/16/16 0600  Gross per 24 hour  Intake           837.35 ml  Output             1225 ml  Net          -387.65 ml   Filed Weights   11/15/16 2000 11/15/16 2045  Weight: 230 lb (104.3 kg) 234 lb 9.6 oz (106.4 kg)    Physical Exam    GEN: Well nourished, well developed, in no acute distress.  HEENT: Grossly normal.  Neck: Supple, no JVD, carotid bruits, or masses. Cardiac: RRR, no murmurs, rubs, or gallops. No clubbing, cyanosis, edema.  Radials/DP/PT 2+ and equal bilaterally.  Respiratory:  Respirations regular and unlabored, clear  to auscultation bilaterally. GI: Soft, nontender, nondistended, BS + x 4. MS: no deformity or atrophy. Skin: warm and dry, no rash. Neuro:  Strength and sensation are intact. Psych: AAOx3.  Normal affect.  Labs    CBC  Recent Labs  11/15/16 1419 11/16/16 0732  WBC 7.6 7.6  HGB 10.1* 9.7*  HCT 31.5* 29.9*  MCV 88.0 88.2  PLT 212 371   Basic Metabolic Panel  Recent Labs  11/15/16 1419  NA 143  K 4.8  CL 117*  CO2 16*  GLUCOSE 143*  BUN 30*  CREATININE 2.42*  CALCIUM 9.3   Liver Function Tests No results for input(s): AST, ALT, ALKPHOS, BILITOT, PROT, ALBUMIN in the last 72 hours. No results for input(s): LIPASE, AMYLASE in the last 72 hours. Cardiac Enzymes  Recent Labs  11/15/16 2111 11/16/16 0304  TROPONINI <0.03 <0.03   BNP Invalid input(s): POCBNP D-Dimer No results for input(s): DDIMER in the last 72 hours. Hemoglobin A1C No results for input(s): HGBA1C in the last 72 hours. Fasting Lipid Panel  No results for input(s): CHOL, HDL, LDLCALC, TRIG, CHOLHDL, LDLDIRECT in the last 72 hours. Thyroid Function Tests No results for input(s): TSH, T4TOTAL, T3FREE, THYROIDAB in the last 72 hours.  Invalid input(s): FREET3  Telemetry    NSR - Personally Reviewed  ECG    NSR - Personally Reviewed  Radiology    Dg Chest 2 View  Result Date: 11/15/2016 CLINICAL DATA:  Chest pain. EXAM: CHEST  2 VIEW COMPARISON:  Radiographs of August 09, 2016. FINDINGS: The heart size and mediastinal contours are within normal limits. Both lungs are clear. No pneumothorax or pleural effusion is noted. Sternotomy wires are noted. The visualized skeletal structures are unremarkable. IMPRESSION: No active cardiopulmonary disease. Electronically Signed   By: Marijo Conception, M.D.   On: 11/15/2016 14:42    Cardiac Studies   none  Patient Profile     This is a 56yo AAM with extensive cardiac history including CAD s/p CABG with RIMA to RCA and multiple PCIs with last cath  12/04/2015 showing 30% dLM, diffuse instent restenosis of the mid and distal LAD up to 50% and occluded LAD distally, 50% small LCx, 50% ostial OM2, 70-80% large RI in the superior branch, anomalous take-off of L coronary cusp and 90% stenosis of the distal RCA beyond anastamosis of the RIMA, 60% RPDA with patent RIMA to RCA and underwent PCI of the mid and distal RCA.  He has chronic anginal pain and has been on Ranexa which controlled it well but unfortunately has not been able to afford it and has not been taking it.  He is working with the [place] to try to get it but nothing has workup out so far.  Recently over the past 2 days the CP has gotten worse 10/10 and we was supposed to followup with Dr. Saunders Revel today but pain was so bad that he went to the ER. He has CKD stage 3 as well.  Assessment & Plan    Coronary artery disease with unstable angina Most recent cath in 2015 showed 70-80% large RI in the superior branch, anomalous take-off of L coronary cusp and 90% stenosis of the distal RCA beyond anastamosis of the RIMA, 60% RPDA with patent RIMA to RCA and underwent PCI of the mid and distal RCA.  His RIMA to RCA was patent but had disease distal to anastamosis and underwent PCI of mid and distal RCA.  He has chronic angina from presumed small vessel disease as well as residual disease of the RI 70-80% and occluded distal LAD. He has been on high dose long acting nitrates as he has not been able to afford his Ranexa and unable to get insurance coverage. -   Trop negative x 3 -   No further CP since admission.  Will stop Heparin and NTG gtt (only on 57mcg) -   Continue Ranexa 500 mg BID that was started on admission  -   Case Manager consulted to try to get financial assistance for Ranexa as this is the only thing that helps his angina. -   Restart Imdur 240mg  daily -   NPO for nuclear stress test today to rule out ischemia in inferior wall. Pain is likely due to small vessel disease but his last PCI of the RCA  was in 2015 and this could also be restenosis of the RCA stent. Would avoid cath in the setting of no demonstrable evidence of active ischemia given significant CKD.  If no large area of ischemia will treat  medically.    Hyperlipidemia On Pravastatin 80 mg daily,  LDL 86, Triglycerides 219, cholesterol 161  (11/12/16).  LDL goal < 70.  He was supposed to be changed to Atorvastatin but says that the Rx never got called in.   -  Stop Pravastatin and start atorvastatin 80mg  daily.  -  Will need FLP and ALT in 6 weeks.  ASCAD -  s/p CABG with RIMA to RCA and multiple PCIs with last cath 12/04/2015 showing 30% dLM, diffuse instent restenosis of the mid and distal LAD up to 50% and occluded LAD distally, 50% small LCx, 50% ostial OM2, 70-80% large RI in the superior branch, anomalous take-off of L coronary cusp and 90% stenosis of the distal RCA beyond anastamosis of the RIMA, 60% RPDA with patent RIMA to RCA - s/p PCI of the mid and distal RCA 2015.   -  Continue ASA/statin/Ranexa/long acting nitrates/BB/Plavix  HTN - BP controlled on current meds. -  Continue amlodipine/BB/hydralazine/diuretic  CKD stage 3 - creatinine stable at baseline  Type 2 DM - BS 143 this am. -  HbA1C 6.9 in July - will recheck today.    I have spent a total of 35 minutes with patient reviewing prior cath and nuclear stress test , telemetry, EKGs, discussing case with Dr. Saunders Revel and reviewing labs and examining patient as well as establishing an assessment and plan that was discussed with the patient.  > 50% of time was spent in direct patient care.    Signed, Fransico Him, MD  11/16/2016, 8:08 AM

## 2016-11-16 NOTE — Progress Notes (Signed)
Had 10/10 chest pain post Lexiscan that was treated with one nitro at 1112, VS stable. No decrease in pain post nitro. Repeat episode of nausea and vomiting. B Simmons present. Given Aminopylline reversal at 1124 and repeat EKG done. Reviewed by Geannie Risen and no changes from baseline pre test. Nausea relieved but chest pain remained 10/10 so nitro repeated. As per B Simmons patient is OK to go under camera as EKG is unchanged. Chest pain decreased to 8/10 and then 6-7/10. In Nuclear Med now and report given to [employer]. VS stable and remained in sinus rhythm. Ray on 3W his nurse was notified of above.

## 2016-11-16 NOTE — Plan of Care (Signed)
Problem: Pain Managment: Goal: General experience of comfort will improve Outcome: Progressing Chest pain 0/10

## 2016-11-16 NOTE — Care Management CC44 (Signed)
Condition Code 44 Documentation Completed  Patient Details  Name: Matthew SCHILDT Sr. MRN: [id number] Date of Birth: 1961/04/07   Condition Code 44 given:  Yes Patient signature on Condition Code 44 notice:  Yes Documentation of 2 MD's agreement:  Yes Code 44 added to claim:  Yes    Bethena Roys, RN 11/16/2016, 3:31 PM

## 2016-11-16 NOTE — Care Management Note (Addendum)
Case Management Note  Patient Details  Name: Matthew ERVEN Sr. MRN: [id number] Date of Birth: 06-Nov-1960  Subjective/Objective:  Pt presented for Chest Pain- CM received consult for Ranexa. Plan will be for home once stable.                   Action/Plan: Benefits check completed and cost listed below. Pt is aware of cost. No further needs identified by CM at this time.   S/[facility]  @ [place] RX # [phone]   1. RANOLAZINE 500 MG TABLET 12 HR   COVER- YES  CO-PAY- $ 45.00  TIER- 3 DRUG  PRIOR APPROVAL- NO   2. RANEXA  COVER- YES  SAME AS ABOVE   PHARMACY ; [facility] AND [facility]   Expected Discharge Date:                  Expected Discharge Plan:  Home/Self Care  In-House Referral:  NA  Discharge planning Services  CM Consult, Medication Assistance  Post Acute Care Choice:  NA Choice offered to:  NA  DME Arranged:  N/A DME Agency:  NA  HH Arranged:  NA HH Agency:  NA  Status of Service:  Completed, signed off  If discussed at Long Length of Stay Meetings, dates discussed:    Additional Comments:  Bethena Roys, RN 11/16/2016, 11:18 AM

## 2016-11-16 NOTE — Progress Notes (Signed)
ANTICOAGULATION CONSULT NOTE - Follow Up Consult  Pharmacy Consult for heparin Indication: chest pain/ACS  Labs:  Recent Labs  11/15/16 1419 11/15/16 2111 11/16/16 0304  HGB 10.1*  --   --   HCT 31.5*  --   --   PLT 212  --   --   LABPROT  --   --  14.0  INR  --   --  1.07  HEPARINUNFRC  --   --  0.56  CREATININE 2.42*  --   --   TROPONINI  --  <0.03  --     Assessment/Plan:  56yo male therapeutic on heparin with initial dosing for CP. Will continue gtt at current rate and confirm stable with additional level.   Wynona Neat, PharmD, BCPS  11/16/2016,3:45 AM

## 2016-11-19 ENCOUNTER — Other Ambulatory Visit (HOSPITAL_COMMUNITY): Payer: Medicare Other

## 2016-11-30 ENCOUNTER — Ambulatory Visit: Payer: Medicare Other | Admitting: Internal Medicine

## 2016-12-04 ENCOUNTER — Other Ambulatory Visit (HOSPITAL_COMMUNITY): Payer: Medicare Other

## 2016-12-13 ENCOUNTER — Ambulatory Visit: Payer: Medicare Other | Admitting: Internal Medicine

## 2016-12-18 ENCOUNTER — Other Ambulatory Visit: Payer: Self-pay

## 2016-12-18 ENCOUNTER — Ambulatory Visit (HOSPITAL_COMMUNITY): Payer: Medicare Other | Attending: Internal Medicine

## 2016-12-18 DIAGNOSIS — I2510 Atherosclerotic heart disease of native coronary artery without angina pectoris: Secondary | ICD-10-CM | POA: Insufficient documentation

## 2016-12-18 DIAGNOSIS — I429 Cardiomyopathy, unspecified: Secondary | ICD-10-CM

## 2016-12-18 DIAGNOSIS — E669 Obesity, unspecified: Secondary | ICD-10-CM | POA: Insufficient documentation

## 2016-12-18 DIAGNOSIS — I083 Combined rheumatic disorders of mitral, aortic and tricuspid valves: Secondary | ICD-10-CM | POA: Insufficient documentation

## 2016-12-18 DIAGNOSIS — E119 Type 2 diabetes mellitus without complications: Secondary | ICD-10-CM | POA: Insufficient documentation

## 2016-12-18 DIAGNOSIS — I10 Essential (primary) hypertension: Secondary | ICD-10-CM | POA: Insufficient documentation

## 2016-12-18 DIAGNOSIS — E785 Hyperlipidemia, unspecified: Secondary | ICD-10-CM | POA: Diagnosis not present

## 2016-12-18 MED ORDER — PERFLUTREN LIPID MICROSPHERE
1.0000 mL | INTRAVENOUS | Status: AC | PRN
  Administered 2016-12-18: 2 mL via INTRAVENOUS

## 2016-12-20 ENCOUNTER — Encounter: Payer: Self-pay | Admitting: Internal Medicine

## 2016-12-20 ENCOUNTER — Ambulatory Visit (INDEPENDENT_AMBULATORY_CARE_PROVIDER_SITE_OTHER): Payer: Medicare Other | Admitting: Internal Medicine

## 2016-12-20 VITALS — BP 140/80 | HR 78 | Ht 65.5 in | Wt 230.0 lb

## 2016-12-20 DIAGNOSIS — I25118 Atherosclerotic heart disease of native coronary artery with other forms of angina pectoris: Secondary | ICD-10-CM | POA: Diagnosis not present

## 2016-12-20 DIAGNOSIS — I10 Essential (primary) hypertension: Secondary | ICD-10-CM | POA: Diagnosis not present

## 2016-12-20 DIAGNOSIS — N183 Chronic kidney disease, stage 3 unspecified: Secondary | ICD-10-CM

## 2016-12-20 DIAGNOSIS — E785 Hyperlipidemia, unspecified: Secondary | ICD-10-CM | POA: Diagnosis not present

## 2016-12-20 MED ORDER — HYDRALAZINE HCL 50 MG PO TABS: 50.0000 mg | ORAL_TABLET | Freq: Three times a day (TID) | ORAL | 3 refills | Status: DC

## 2016-12-20 NOTE — Progress Notes (Signed)
Follow-up Outpatient Visit Date: 12/20/2016  Chief Complaint: Chest pain  HPI:  Mr. Noecker is a 57 y.o. 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 I last saw him on 08/16/16. At that time, he reported stable angina that was well-managed with isosorbide mononitrate, carvedilol, and amlodipine. He has responded best to ranolazine in the past but has had difficulty affording this despite multiple attempts by our office to obtain a tear reduction for his copayment. He was readmitted for worsening chest pain to [facility] last month, concerning for unstable angina. He underwent myocardial perfusion stress testing, which revealed a fixed inferior defect and moderate to severely reduced LVEF. The patient notes that he felt very bad during the stress test, having chest tightness, shortness of breath, and nausea that lasted about 20 minutes. Given his advanced chronic kidney disease, the decision was made to defer catheterization, as his chest pain had returned to baseline with addition of ranolazine. Mr. Feasel has been able to obtain ranolazine with the assistance of case management/social work, though he notes it is still fairly expensive. His chest pain has been near its baseline since leaving the hospital.  Today, the patient reports having occasional episodes of chest pain, typically with mild to moderate exertion. These resolved spontaneously with a few minutes of rest or with a single sublingual nitroglycerin every other day, which is consistent with his stable angina. He is trying to exercise and would like to begin the Silver sneaker program in the near future. He has been unable to afford cardiac rehabilitation in the past. He denies significant  shortness of breath, orthopnea, PND, and edema. He wears CPAP on a regular basis.  --------------------------------------------------------------------------------------------------  Cardiovascular History & Procedures: Cardiovascular Problems:  Coronary artery disease with chronic stable angina  Risk Factors:  Known coronary artery disease, hypertension, hyperlipidemia, diabetes mellitus, and male gender  Cath/PCI:  LHC (12/02/13): LMCA with 30% distal stenosis. LAD with multiple stents and 50% ostial stenosis followed by 20% diffuse in-stent restenosis proximally. There is diffuse 50% in-stent restenosis in the mid and distal segments. The distal LAD is occluded. LCx is small with 40-50% ostial stenosis. OM 2 has a 50% ostial stenosis. Ramus intermedius is a large vessel with 70-80% stenosis involving its superior branch. 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 There is diffuse 20% proximal disease. There is a 90% stenosis involving the distal vessel just beyond the anastomosis with the RIMA. 60% stenosis extending into the RPDA is also evident. RIMA to RCA is widely patent with aforementioned stenosis just beyond the distal anastomosis.  PCI (12/03/13): Successful PCI to the mid/distal RCA with overlapping Promus drug-eluting stents (2.25 x 24 and 2.25 x 16 mm).  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  EP Procedures and Devices:  None  Non-Invasive Evaluation(s):  Transthoracic echocardiogram (12/18/16): Normal LV size with moderate LVH. LVEF mildly reduced at 45-50% with anterior, apical septal, apical, and apical inferoapical akinesis. Grade 1 diastolic dysfunction. Trileaflet aortic valve with mild stenosis (mean gradient 14 mmHg). Moderately dilated left ventricle. Mildly dilated right ventricle with mildly reduced contraction. Normal pulmonary artery and central venous pressure.  Pharmacologic myocardial perfusion  stress test (11/16/16): High risk study with large  fixed inferior defect extending to the apex. Dilated left ventricle with akinesis of the inferior wall. LVEF 35%.  Transthoracic echocardiogram (12/18/16): Normal LV size with moderate LVH. LVEF mildly reduced (45-50%). There is anterior and apical akinesis suggestive of LAD disease. Mild aortic stenosis noted with a mean gradient of 14 mmHg. Left atrium moderately dilated. Right ventricle mildly dilated and mildly reduced contraction.  Pharmacologic myocardial perfusion stress test (11/16/16): Large fixed differential defect involving the inferior wall and apex consistent with scar. LVEF 35% with inferior akinesis.  Transthoracic echocardiogram (01/02/15): Normal LV size with mild LVH. LVEF 50-55% with normal wall motion and grade 1 diastolic dysfunction. Very mild aortic stenosis. Mitral and or calcium patient with mild leaflet thickening and regurgitation. Normal RV size and function.  Pharmacologic myocardial perfusion stress test (11/28/13): Large, partially reversible inferolateral defect. Fixed apical defect. LVEF 54%.  Recent CV Pertinent Labs: Lab Results  Component Value Date   CHOL 161 11/12/2016   HDL 31 (L) 11/12/2016   LDLCALC 86 11/12/2016   LDLDIRECT 72.0 11/03/2014   TRIG 219 (H) 11/12/2016   CHOLHDL 5.2 (H) 11/12/2016   CHOLHDL 6.6 03/25/2016   INR 1.07 11/16/2016   BNP 179.2 (H) 08/09/2016   K 4.5 11/16/2016   MG 1.6 (L) 04/04/2016   BUN 25 (H) 11/16/2016   CREATININE 2.14 (H) 11/16/2016   CREATININE 2.47 (H) 04/19/2016     Past medical and surgical history were reviewed and updated in EPIC.   Outpatient Encounter Prescriptions as of 12/20/2016  Medication Sig  . acetaminophen (TYLENOL) 500 MG tablet Take 1,000 mg by mouth every 8 (eight) hours as needed for mild pain.   Marland Kitchen amLODipine (NORVASC) 10 MG tablet Take 1 tablet (10 mg total) by mouth daily.  Marland Kitchen aspirin 81 MG tablet Take 1 tablet (81 mg total) by mouth daily.    Marland Kitchen atorvastatin (LIPITOR) 80 MG tablet Take 1 tablet (80 mg total) by mouth daily.  . carvedilol (COREG) 25 MG tablet TAKE TWO TABLETS BY MOUTH TWICE DAILY  . clopidogrel (PLAVIX) 75 MG tablet Take 1 tablet (75 mg total) by mouth daily. With breakfast.  . diphenhydrAMINE (BENADRYL) 25 mg capsule Take 25 mg by mouth daily as needed for allergies.  . diphenhydramine-acetaminophen (TYLENOL PM) 25-500 MG TABS tablet Take 1 tablet by mouth at bedtime.  . gabapentin (NEURONTIN) 300 MG capsule Take 300 mg by mouth 3 (three) times daily.  . hydrALAZINE (APRESOLINE) 50 MG tablet Take 1 tablet (50 mg total) by mouth 3 (three) times daily.  . hydrochlorothiazide (HYDRODIURIL) 25 MG tablet Take 25 mg by mouth daily.  . isosorbide mononitrate (IMDUR) 120 MG 24 hr tablet Take 2 tablets (240 mg total) by mouth daily.  . metFORMIN (GLUCOPHAGE) 1000 MG tablet Take 1,000 mg by mouth 2 (two) times daily with a meal.  . Multiple Vitamin (MULTIVITAMIN WITH MINERALS) TABS tablet Take 1 tablet by mouth daily.  . nitroGLYCERIN (NITROLINGUAL) 0.4 MG/SPRAY spray Place 1 spray under the tongue every 5 (five) minutes x 3 doses as needed for chest pain.  . Omega-3 Fatty Acids (FISH OIL) 1200 MG CAPS Take 2,400 mg by mouth 2 (two) times daily.   . pantoprazole (PROTONIX) 40 MG tablet Take 40 mg by mouth daily.   . ranolazine (RANEXA) 500 MG 12 hr tablet Take 1 tablet (500 mg total) by mouth 2 (two) times daily.  . traMADol (ULTRAM) 50 MG tablet Take 50 mg by mouth every 8 (eight) hours as needed for moderate  pain.  . vitamin E 400 UNIT capsule Take 400 Units by mouth daily.  Marland Kitchen zolpidem (AMBIEN) 10 MG tablet Take 10 mg by mouth at bedtime as needed for sleep.  . [DISCONTINUED] colchicine 0.6 MG tablet Take 1 tablet (0.6 mg total) by mouth daily. (Patient not taking: Reported on 12/20/2016)   No facility-administered encounter medications on file as of 12/20/2016.     Allergies: Ciprocin-fluocin-procin [fluocinolone  acetonide]; Clarithromycin; Cleocin [clindamycin hcl]; Glimepiride [amaryl]; and Colchicine  Social History   Social History  . Marital status: Married    Spouse name: [name]  . Number of children: 5  . Years of education: N/A   Occupational History  . retired     Chartered loss adjuster   Social History Main Topics  . Smoking status: Former Smoker    Types: Cigarettes  . Smokeless tobacco: Never Used     Comment: smoked some as a teenager (high school) not even a pack a week  . Alcohol use No     Comment: 09/15/2012 "drank a little when I was young"  . Drug use: No  . Sexual activity: Yes   Other Topics Concern  . Not on file   Social History Narrative   Married and lives with wife in [place]. On disability.    Consumes about 2 Coke Zeros a day     Family History  Problem Relation Age of Onset  . Lung cancer Mother   . Anemia Mother   . Polycystic kidney disease Mother   . Diabetes Father   . Heart attack Father 26    Died suddenly  . Heart failure Father   . Hyperlipidemia Father   . Hypertension Father   . Kidney failure Sister 5    Died  . Heart attack Sister   . Hypertension Sister   . Diabetes Sister   . Diabetes Brother   . Polycystic kidney disease Brother   . Diabetes Sister     Review of Systems: A 12-system review of systems was performed and was negative except as noted in the HPI.  --------------------------------------------------------------------------------------------------  Physical Exam: BP 140/80   Pulse 78   Ht 5' 5.5" (1.664 m)   Wt 230 lb (104.3 kg)   SpO2 98%   BMI 37.69 kg/m   General:  Obese man, seated comfortably in the exam room. He is accompanied by his wife. HEENT: No conjunctival pallor or scleral icterus.  Moist mucous membranes.  OP clear. Neck: Supple without lymphadenopathy, thyromegaly, JVD, or HJR.  No carotid bruit. Lungs: Normal work of breathing.  Clear to auscultation bilaterally without wheezes or crackles. Heart:  Regular rate and rhythm without murmurs, rubs, or gallops.  Non-displaced PMI. Abd: Bowel sounds present.  Soft, NT/ND without hepatosplenomegaly Ext: No lower extremity edema.  Radial, PT, and DP pulses are 2+ bilaterally. Skin: warm and dry without rash  EKG (08/10/16):  Normal sinus rhythm with left axis deviation, inferior Q waves and poor R-wave progression. No significant change from prior tracing on 11/16/16 (I have personally reviewed both tracings).  Lab Results  Component Value Date   WBC 7.6 11/16/2016   HGB 9.7 (L) 11/16/2016   HCT 29.9 (L) 11/16/2016   MCV 88.2 11/16/2016   PLT 209 11/16/2016    Lab Results  Component Value Date   NA 140 11/16/2016   K 4.5 11/16/2016   CL 114 (H) 11/16/2016   CO2 18 (L) 11/16/2016   BUN 25 (H) 11/16/2016   CREATININE 2.14 (H)  11/16/2016   GLUCOSE 111 (H) 11/16/2016   ALT 8 11/12/2016    Lab Results  Component Value Date   CHOL 161 11/12/2016   HDL 31 (L) 11/12/2016   LDLCALC 86 11/12/2016   LDLDIRECT 72.0 11/03/2014   TRIG 219 (H) 11/12/2016   CHOLHDL 5.2 (H) 11/12/2016    --------------------------------------------------------------------------------------------------  ASSESSMENT AND PLAN: Coronary artery disease with chronic stable angina Mr. Geil reports chronic stable angina at its baseline since recent hospitalization. He has been able to procure a steady supply of ranolazine, which seems to be helping his symptoms. He still uses nitroglycerin on average every other day. He would like to begin exercising some, which I have encouraged, albeit with caution so that he does not push himself to the point of severe chest pain. His recent myocardial perfusion stress test and echocardiogram revealed discrepant results, with the stress test showing predominantly inferior scar whereas the echo suggests LAD territory ischemia/infarction. Ultimately, I think we will need to repeat cardiac catheterization, though the patient's advanced  CKD. Makes this challenging. His last catheterization in 2015 was notable for severe LAD and RCA disease; staged PCI to the RCA was performed at that time. We have agreed to increase hydralazine to 50 mg 3 times a day (patient was only taking twice a day) see if improved blood pressure control improves his symptoms. If not, we will need to readdress cardiac catheterization when we follow-up next month.  Hyperlipidemia The patient was switched from pravastatin to atorvastatin at our last visit. Interestingly, his LDL actually increased with this change, raising question about medication compliance. I reiterated the importance of taking his medications.  Essential hypertension Blood pressure is suboptimal today. He has only been taking hydralazine twice a day. I have instructed him to take hydralazine 50 mg 3 times a day in addition to the remainder of his medications.  Chronic kidney disease stage III Most recent creatinine was near baseline at 2.1. I have encouraged Mr. Crossland to establish care with a nephrologist, given that we will likely need to reconsider cardiac catheterization and her next visit. It would be helpful to have nephrology on board should he develop worsening renal function after the procedure.  Follow-up: Return to clinic in 1 month.  Nelva Bush, MD 12/22/2016 3:46 PM

## 2016-12-20 NOTE — Patient Instructions (Addendum)
Medication Instructions:  Increase hydralazine to 50mg  three times a day.  Labwork: None   Testing/Procedures: None   Follow-Up: Your physician recommends that you schedule a follow-up appointment in: 1 month with Dr End.        If you need a refill on your cardiac medications before your next appointment, please call your pharmacy.

## 2016-12-22 ENCOUNTER — Encounter: Payer: Self-pay | Admitting: Internal Medicine

## 2016-12-24 ENCOUNTER — Other Ambulatory Visit: Payer: Self-pay | Admitting: Cardiology

## 2016-12-25 ENCOUNTER — Other Ambulatory Visit: Payer: Self-pay | Admitting: Cardiology

## 2016-12-25 ENCOUNTER — Other Ambulatory Visit: Payer: Self-pay

## 2016-12-25 MED ORDER — NITROGLYCERIN 0.4 MG/SPRAY TL SOLN: 1.0000 | 12 refills | Status: DC | PRN

## 2017-01-15 ENCOUNTER — Encounter: Payer: Self-pay | Admitting: Internal Medicine

## 2017-01-31 ENCOUNTER — Ambulatory Visit: Payer: Medicare Other | Admitting: Internal Medicine

## 2017-02-07 ENCOUNTER — Emergency Department (HOSPITAL_COMMUNITY): Payer: Medicare Other

## 2017-02-07 ENCOUNTER — Encounter (HOSPITAL_COMMUNITY): Payer: Self-pay

## 2017-02-07 DIAGNOSIS — G4733 Obstructive sleep apnea (adult) (pediatric): Secondary | ICD-10-CM | POA: Diagnosis not present

## 2017-02-07 DIAGNOSIS — Z79899 Other long term (current) drug therapy: Secondary | ICD-10-CM

## 2017-02-07 DIAGNOSIS — Z7982 Long term (current) use of aspirin: Secondary | ICD-10-CM

## 2017-02-07 DIAGNOSIS — E1122 Type 2 diabetes mellitus with diabetic chronic kidney disease: Secondary | ICD-10-CM | POA: Diagnosis not present

## 2017-02-07 DIAGNOSIS — Z955 Presence of coronary angioplasty implant and graft: Secondary | ICD-10-CM

## 2017-02-07 DIAGNOSIS — I130 Hypertensive heart and chronic kidney disease with heart failure and stage 1 through stage 4 chronic kidney disease, or unspecified chronic kidney disease: Secondary | ICD-10-CM | POA: Diagnosis not present

## 2017-02-07 DIAGNOSIS — Z888 Allergy status to other drugs, medicaments and biological substances status: Secondary | ICD-10-CM

## 2017-02-07 DIAGNOSIS — I2510 Atherosclerotic heart disease of native coronary artery without angina pectoris: Secondary | ICD-10-CM | POA: Diagnosis not present

## 2017-02-07 DIAGNOSIS — I5043 Acute on chronic combined systolic (congestive) and diastolic (congestive) heart failure: Secondary | ICD-10-CM | POA: Diagnosis present

## 2017-02-07 DIAGNOSIS — E669 Obesity, unspecified: Secondary | ICD-10-CM | POA: Diagnosis not present

## 2017-02-07 DIAGNOSIS — K219 Gastro-esophageal reflux disease without esophagitis: Secondary | ICD-10-CM | POA: Diagnosis present

## 2017-02-07 DIAGNOSIS — Z87891 Personal history of nicotine dependence: Secondary | ICD-10-CM

## 2017-02-07 DIAGNOSIS — Z951 Presence of aortocoronary bypass graft: Secondary | ICD-10-CM

## 2017-02-07 DIAGNOSIS — I350 Nonrheumatic aortic (valve) stenosis: Secondary | ICD-10-CM | POA: Diagnosis not present

## 2017-02-07 DIAGNOSIS — I252 Old myocardial infarction: Secondary | ICD-10-CM

## 2017-02-07 DIAGNOSIS — E785 Hyperlipidemia, unspecified: Secondary | ICD-10-CM | POA: Diagnosis present

## 2017-02-07 DIAGNOSIS — Z6838 Body mass index (BMI) 38.0-38.9, adult: Secondary | ICD-10-CM | POA: Diagnosis not present

## 2017-02-07 DIAGNOSIS — Z7984 Long term (current) use of oral hypoglycemic drugs: Secondary | ICD-10-CM

## 2017-02-07 DIAGNOSIS — N183 Chronic kidney disease, stage 3 (moderate): Secondary | ICD-10-CM | POA: Diagnosis present

## 2017-02-07 DIAGNOSIS — Z8249 Family history of ischemic heart disease and other diseases of the circulatory system: Secondary | ICD-10-CM

## 2017-02-07 DIAGNOSIS — D638 Anemia in other chronic diseases classified elsewhere: Secondary | ICD-10-CM | POA: Diagnosis not present

## 2017-02-07 DIAGNOSIS — R0602 Shortness of breath: Secondary | ICD-10-CM | POA: Diagnosis present

## 2017-02-07 LAB — BASIC METABOLIC PANEL
ANION GAP: 9 (ref 5–15)
BUN: 28 mg/dL — ABNORMAL HIGH (ref 6–20)
CALCIUM: 9.6 mg/dL (ref 8.9–10.3)
CO2: 17 mmol/L — ABNORMAL LOW (ref 22–32)
Chloride: 112 mmol/L — ABNORMAL HIGH (ref 101–111)
Creatinine, Ser: 2.44 mg/dL — ABNORMAL HIGH (ref 0.61–1.24)
GFR, EST AFRICAN AMERICAN: 32 mL/min — AB (ref >60)
GFR, EST NON AFRICAN AMERICAN: 28 mL/min — AB (ref >60)
Glucose, Bld: 121 mg/dL — ABNORMAL HIGH (ref 65–99)
Potassium: 4.4 mmol/L (ref 3.5–5.1)
SODIUM: 138 mmol/L (ref 135–145)

## 2017-02-07 LAB — CBC
HCT: 29.6 % — ABNORMAL LOW (ref 39.0–52.0)
HEMOGLOBIN: 9.3 g/dL — AB (ref 13.0–17.0)
MCH: 27.5 pg (ref 26.0–34.0)
MCHC: 31.4 g/dL (ref 30.0–36.0)
MCV: 87.6 fL (ref 78.0–100.0)
Platelets: 229 10*3/uL (ref 150–400)
RBC: 3.38 MIL/uL — AB (ref 4.22–5.81)
RDW: 14.4 % (ref 11.5–15.5)
WBC: 8.1 10*3/uL (ref 4.0–10.5)

## 2017-02-07 LAB — I-STAT TROPONIN, ED: TROPONIN I, POC: 0.01 ng/mL (ref 0.00–0.08)

## 2017-02-07 NOTE — ED Triage Notes (Signed)
Pt states that he started to feel SOB yesterday, with central CP, reports coughing up some clear phlegm, denies fevers. No radiation, denies n/v

## 2017-02-08 ENCOUNTER — Inpatient Hospital Stay (HOSPITAL_COMMUNITY)
Admission: EM | Admit: 2017-02-08 | Discharge: 2017-02-09 | DRG: 291 | Disposition: A | Discharge status: Home / Self Care | Payer: Medicare Other | Attending: Internal Medicine | Admitting: Internal Medicine

## 2017-02-08 ENCOUNTER — Encounter (HOSPITAL_COMMUNITY): Payer: Self-pay | Admitting: Internal Medicine

## 2017-02-08 DIAGNOSIS — R079 Chest pain, unspecified: Secondary | ICD-10-CM | POA: Diagnosis not present

## 2017-02-08 DIAGNOSIS — R0602 Shortness of breath: Secondary | ICD-10-CM | POA: Diagnosis present

## 2017-02-08 DIAGNOSIS — Z6838 Body mass index (BMI) 38.0-38.9, adult: Secondary | ICD-10-CM | POA: Diagnosis not present

## 2017-02-08 DIAGNOSIS — I5033 Acute on chronic diastolic (congestive) heart failure: Secondary | ICD-10-CM

## 2017-02-08 DIAGNOSIS — E669 Obesity, unspecified: Secondary | ICD-10-CM | POA: Diagnosis not present

## 2017-02-08 DIAGNOSIS — D638 Anemia in other chronic diseases classified elsewhere: Secondary | ICD-10-CM | POA: Diagnosis not present

## 2017-02-08 DIAGNOSIS — I5023 Acute on chronic systolic (congestive) heart failure: Secondary | ICD-10-CM

## 2017-02-08 DIAGNOSIS — Z7982 Long term (current) use of aspirin: Secondary | ICD-10-CM | POA: Diagnosis not present

## 2017-02-08 DIAGNOSIS — E1159 Type 2 diabetes mellitus with other circulatory complications: Secondary | ICD-10-CM | POA: Diagnosis present

## 2017-02-08 DIAGNOSIS — N183 Chronic kidney disease, stage 3 (moderate): Secondary | ICD-10-CM | POA: Diagnosis not present

## 2017-02-08 DIAGNOSIS — I350 Nonrheumatic aortic (valve) stenosis: Secondary | ICD-10-CM | POA: Diagnosis not present

## 2017-02-08 DIAGNOSIS — R072 Precordial pain: Secondary | ICD-10-CM | POA: Diagnosis present

## 2017-02-08 DIAGNOSIS — Z9989 Dependence on other enabling machines and devices: Secondary | ICD-10-CM

## 2017-02-08 DIAGNOSIS — E1121 Type 2 diabetes mellitus with diabetic nephropathy: Secondary | ICD-10-CM | POA: Diagnosis present

## 2017-02-08 DIAGNOSIS — Z87891 Personal history of nicotine dependence: Secondary | ICD-10-CM | POA: Diagnosis not present

## 2017-02-08 DIAGNOSIS — E1122 Type 2 diabetes mellitus with diabetic chronic kidney disease: Secondary | ICD-10-CM | POA: Diagnosis not present

## 2017-02-08 DIAGNOSIS — I10 Essential (primary) hypertension: Secondary | ICD-10-CM | POA: Diagnosis present

## 2017-02-08 DIAGNOSIS — G4733 Obstructive sleep apnea (adult) (pediatric): Secondary | ICD-10-CM | POA: Diagnosis not present

## 2017-02-08 DIAGNOSIS — Z79899 Other long term (current) drug therapy: Secondary | ICD-10-CM | POA: Diagnosis not present

## 2017-02-08 DIAGNOSIS — K219 Gastro-esophageal reflux disease without esophagitis: Secondary | ICD-10-CM | POA: Diagnosis not present

## 2017-02-08 DIAGNOSIS — Z9861 Coronary angioplasty status: Secondary | ICD-10-CM

## 2017-02-08 DIAGNOSIS — I130 Hypertensive heart and chronic kidney disease with heart failure and stage 1 through stage 4 chronic kidney disease, or unspecified chronic kidney disease: Secondary | ICD-10-CM | POA: Diagnosis not present

## 2017-02-08 DIAGNOSIS — I2510 Atherosclerotic heart disease of native coronary artery without angina pectoris: Secondary | ICD-10-CM | POA: Diagnosis present

## 2017-02-08 DIAGNOSIS — E785 Hyperlipidemia, unspecified: Secondary | ICD-10-CM | POA: Diagnosis not present

## 2017-02-08 DIAGNOSIS — I252 Old myocardial infarction: Secondary | ICD-10-CM | POA: Diagnosis not present

## 2017-02-08 DIAGNOSIS — I5043 Acute on chronic combined systolic (congestive) and diastolic (congestive) heart failure: Secondary | ICD-10-CM | POA: Diagnosis not present

## 2017-02-08 DIAGNOSIS — Z7984 Long term (current) use of oral hypoglycemic drugs: Secondary | ICD-10-CM | POA: Diagnosis not present

## 2017-02-08 DIAGNOSIS — Z951 Presence of aortocoronary bypass graft: Secondary | ICD-10-CM | POA: Diagnosis not present

## 2017-02-08 LAB — BRAIN NATRIURETIC PEPTIDE: B NATRIURETIC PEPTIDE 5: 962 pg/mL — AB (ref 0.0–100.0)

## 2017-02-08 LAB — COMPREHENSIVE METABOLIC PANEL
ALK PHOS: 58 U/L (ref 38–126)
ALT: 29 U/L (ref 17–63)
ANION GAP: 10 (ref 5–15)
AST: 26 U/L (ref 15–41)
Albumin: 3.8 g/dL (ref 3.5–5.0)
BUN: 27 mg/dL — ABNORMAL HIGH (ref 6–20)
CALCIUM: 9.7 mg/dL (ref 8.9–10.3)
CO2: 18 mmol/L — ABNORMAL LOW (ref 22–32)
CREATININE: 2.5 mg/dL — AB (ref 0.61–1.24)
Chloride: 112 mmol/L — ABNORMAL HIGH (ref 101–111)
GFR calc Af Amer: 31 mL/min — ABNORMAL LOW (ref >60)
GFR, EST NON AFRICAN AMERICAN: 27 mL/min — AB (ref >60)
Glucose, Bld: 129 mg/dL — ABNORMAL HIGH (ref 65–99)
Potassium: 4.7 mmol/L (ref 3.5–5.1)
Sodium: 140 mmol/L (ref 135–145)
TOTAL PROTEIN: 7.3 g/dL (ref 6.5–8.1)
Total Bilirubin: 0.7 mg/dL (ref 0.3–1.2)

## 2017-02-08 LAB — CBC WITH DIFFERENTIAL/PLATELET
Basophils Absolute: 0 K/uL (ref 0.0–0.1)
Basophils Relative: 0 %
Eosinophils Absolute: 0.1 K/uL (ref 0.0–0.7)
Eosinophils Relative: 1 %
HCT: 31.5 % — ABNORMAL LOW (ref 39.0–52.0)
Hemoglobin: 9.8 g/dL — ABNORMAL LOW (ref 13.0–17.0)
Lymphocytes Relative: 18 %
Lymphs Abs: 1.6 K/uL (ref 0.7–4.0)
MCH: 27.5 pg (ref 26.0–34.0)
MCHC: 31.1 g/dL (ref 30.0–36.0)
MCV: 88.5 fL (ref 78.0–100.0)
Monocytes Absolute: 0.9 K/uL (ref 0.1–1.0)
Monocytes Relative: 10 %
Neutro Abs: 6.5 K/uL (ref 1.7–7.7)
Neutrophils Relative %: 71 %
Platelets: 220 K/uL (ref 150–400)
RBC: 3.56 MIL/uL — ABNORMAL LOW (ref 4.22–5.81)
RDW: 14.5 % (ref 11.5–15.5)
WBC: 9.2 K/uL (ref 4.0–10.5)

## 2017-02-08 LAB — GLUCOSE, CAPILLARY
Glucose-Capillary: 134 mg/dL — ABNORMAL HIGH (ref 65–99)
Glucose-Capillary: 157 mg/dL — ABNORMAL HIGH (ref 65–99)
Glucose-Capillary: 109 mg/dL — ABNORMAL HIGH (ref 65–99)
Glucose-Capillary: 137 mg/dL — ABNORMAL HIGH (ref 65–99)
Glucose-Capillary: 141 mg/dL — ABNORMAL HIGH (ref 65–99)

## 2017-02-08 LAB — TROPONIN I: Troponin I: <0.03 ng/mL (ref <0.03)

## 2017-02-08 LAB — D-DIMER, QUANTITATIVE: D-Dimer, Quant: 0.66 ug/mL-FEU — ABNORMAL HIGH (ref 0.00–0.50)

## 2017-02-08 MED ORDER — CARVEDILOL 25 MG PO TABS
50.0000 mg | ORAL_TABLET | Freq: Two times a day (BID) | ORAL | Status: DC
  Administered 2017-02-08 – 2017-02-09 (×3): 50 mg via ORAL
  Filled 2017-02-08 (×3): qty 2

## 2017-02-08 MED ORDER — GABAPENTIN 300 MG PO CAPS
300.0000 mg | ORAL_CAPSULE | Freq: Three times a day (TID) | ORAL | Status: DC
  Administered 2017-02-08 – 2017-02-09 (×4): 300 mg via ORAL
  Filled 2017-02-08 (×4): qty 1

## 2017-02-08 MED ORDER — TRAMADOL HCL 50 MG PO TABS
50.0000 mg | ORAL_TABLET | Freq: Three times a day (TID) | ORAL | Status: DC | PRN
Start: 2017-02-08 — End: 2017-02-09

## 2017-02-08 MED ORDER — ACETAMINOPHEN 650 MG RE SUPP: 650.0000 mg | Freq: Four times a day (QID) | RECTAL | Status: DC | PRN

## 2017-02-08 MED ORDER — ASPIRIN 81 MG PO CHEW
324.0000 mg | CHEWABLE_TABLET | Freq: Once | ORAL | Status: AC
  Administered 2017-02-08: 324 mg via ORAL

## 2017-02-08 MED ORDER — HEPARIN SODIUM (PORCINE) 5000 UNIT/ML IJ SOLN
5000.0000 [IU] | Freq: Three times a day (TID) | INTRAMUSCULAR | Status: DC
  Filled 2017-02-08: qty 1

## 2017-02-08 MED ORDER — FUROSEMIDE 10 MG/ML IJ SOLN
40.0000 mg | Freq: Two times a day (BID) | INTRAMUSCULAR | Status: DC
  Administered 2017-02-08 – 2017-02-09 (×3): 40 mg via INTRAVENOUS
  Filled 2017-02-08 (×3): qty 4

## 2017-02-08 MED ORDER — FUROSEMIDE 10 MG/ML IJ SOLN
40.0000 mg | Freq: Once | INTRAMUSCULAR | Status: AC
  Administered 2017-02-08: 40 mg via INTRAVENOUS

## 2017-02-08 MED ORDER — ADULT MULTIVITAMIN W/MINERALS CH
1.0000 | ORAL_TABLET | Freq: Every day | ORAL | Status: DC
  Administered 2017-02-08 – 2017-02-09 (×2): 1 via ORAL
  Filled 2017-02-08 (×2): qty 1

## 2017-02-08 MED ORDER — CLOPIDOGREL BISULFATE 75 MG PO TABS
75.0000 mg | ORAL_TABLET | Freq: Every day | ORAL | Status: DC
  Administered 2017-02-08 – 2017-02-09 (×2): 75 mg via ORAL
  Filled 2017-02-08 (×2): qty 1

## 2017-02-08 MED ORDER — PANTOPRAZOLE SODIUM 40 MG PO TBEC
40.0000 mg | DELAYED_RELEASE_TABLET | Freq: Every day | ORAL | Status: DC
  Administered 2017-02-08 – 2017-02-09 (×2): 40 mg via ORAL
  Filled 2017-02-08 (×2): qty 1

## 2017-02-08 MED ORDER — ACETAMINOPHEN 325 MG PO TABS
650.0000 mg | ORAL_TABLET | Freq: Four times a day (QID) | ORAL | Status: DC | PRN
Start: 2017-02-08 — End: 2017-02-09

## 2017-02-08 MED ORDER — OMEGA-3-ACID ETHYL ESTERS 1 G PO CAPS
2.0000 g | ORAL_CAPSULE | Freq: Two times a day (BID) | ORAL | Status: DC
  Administered 2017-02-08 – 2017-02-09 (×3): 2 g via ORAL
  Filled 2017-02-08 (×3): qty 2

## 2017-02-08 MED ORDER — NITROGLYCERIN 0.4 MG/SPRAY TL SOLN: 1.0000 | Status: DC | PRN

## 2017-02-08 MED ORDER — ASPIRIN 81 MG PO CHEW
81.0000 mg | CHEWABLE_TABLET | Freq: Every day | ORAL | Status: DC
  Administered 2017-02-08 – 2017-02-09 (×2): 81 mg via ORAL
  Filled 2017-02-08 (×2): qty 1

## 2017-02-08 MED ORDER — FUROSEMIDE 10 MG/ML IJ SOLN
INTRAMUSCULAR | Status: AC
  Filled 2017-02-08: qty 4

## 2017-02-08 MED ORDER — ONDANSETRON HCL 4 MG PO TABS: 4.0000 mg | ORAL_TABLET | Freq: Four times a day (QID) | ORAL | Status: DC | PRN

## 2017-02-08 MED ORDER — HYDRALAZINE HCL 50 MG PO TABS
50.0000 mg | ORAL_TABLET | Freq: Three times a day (TID) | ORAL | Status: DC
  Administered 2017-02-08 – 2017-02-09 (×4): 50 mg via ORAL
  Filled 2017-02-08 (×4): qty 1

## 2017-02-08 MED ORDER — ATORVASTATIN CALCIUM 80 MG PO TABS
80.0000 mg | ORAL_TABLET | Freq: Every day | ORAL | Status: DC
  Administered 2017-02-08: 80 mg via ORAL
  Filled 2017-02-08: qty 1

## 2017-02-08 MED ORDER — AMLODIPINE BESYLATE 10 MG PO TABS
10.0000 mg | ORAL_TABLET | Freq: Every day | ORAL | Status: DC
  Administered 2017-02-08 – 2017-02-09 (×2): 10 mg via ORAL
  Filled 2017-02-08 (×2): qty 1

## 2017-02-08 MED ORDER — ISOSORBIDE MONONITRATE ER 30 MG PO TB24
240.0000 mg | ORAL_TABLET | Freq: Every day | ORAL | Status: DC
  Administered 2017-02-08 – 2017-02-09 (×2): 240 mg via ORAL
  Filled 2017-02-08 (×2): qty 8

## 2017-02-08 MED ORDER — ZOLPIDEM TARTRATE 5 MG PO TABS
10.0000 mg | ORAL_TABLET | Freq: Every evening | ORAL | Status: DC | PRN
  Administered 2017-02-08: 10 mg via ORAL
  Filled 2017-02-08: qty 2

## 2017-02-08 MED ORDER — VITAMIN E 180 MG (400 UNIT) PO CAPS
400.0000 [IU] | ORAL_CAPSULE | Freq: Every day | ORAL | Status: DC
  Administered 2017-02-08 – 2017-02-09 (×2): 400 [IU] via ORAL
  Filled 2017-02-08 (×2): qty 1

## 2017-02-08 MED ORDER — FISH OIL 1200 MG PO CAPS: 2400.0000 mg | ORAL_CAPSULE | Freq: Two times a day (BID) | ORAL | Status: DC

## 2017-02-08 MED ORDER — NITROGLYCERIN 0.4 MG SL SUBL
0.4000 mg | SUBLINGUAL_TABLET | SUBLINGUAL | Status: DC | PRN
  Administered 2017-02-08 (×2): 0.4 mg via SUBLINGUAL
  Filled 2017-02-08 (×2): qty 1

## 2017-02-08 MED ORDER — INSULIN ASPART 100 UNIT/ML ~~LOC~~ SOLN
0.0000 [IU] | Freq: Three times a day (TID) | SUBCUTANEOUS | Status: DC
Start: 2017-02-08 — End: 2017-02-09
  Administered 2017-02-08: 2 [IU] via SUBCUTANEOUS
  Administered 2017-02-08 – 2017-02-09 (×2): 1 [IU] via SUBCUTANEOUS

## 2017-02-08 MED ORDER — ASPIRIN 81 MG PO CHEW
CHEWABLE_TABLET | ORAL | Status: AC
  Filled 2017-02-08: qty 4

## 2017-02-08 MED ORDER — ONDANSETRON HCL 4 MG/2ML IJ SOLN: 4.0000 mg | Freq: Four times a day (QID) | INTRAMUSCULAR | Status: DC | PRN

## 2017-02-08 NOTE — Procedures (Signed)
Home cpap set up for pt.

## 2017-02-08 NOTE — ED Notes (Signed)
Attempted IV on pt x1. This RN was to attempt [place] IV after explaining to pt procedure, but pt family requested that another RN start IV on pt due to her being "uncomfortable with the procedure." Charge RN notified and attempted to find another RN to start IV, but nobody available.

## 2017-02-08 NOTE — Progress Notes (Signed)
   02/08/17 0424  Vitals  Temp 98.7 F (37.1 C)  Temp Source Oral  BP (!) 149/78  BP Location Left Arm  BP Method Automatic  Patient Position (if appropriate) Sitting  Pulse Rate 84  Pulse Rate Source Dinamap  Oxygen Therapy  SpO2 99 %  O2 Device Room Air  Admitted pt to rm 3E25 from ED, pt alert and oriented, denied pain at this time, oriented to room, call bell placed within reach, placed on cardiac monitor, CCMD made aware.

## 2017-02-08 NOTE — Progress Notes (Signed)
[facility] PROGRESS NOTE  Less Woolsey IWP:[id number] DOB: Aug 10, 1961 DOA: 02/08/2017  PCP: Barrie Lyme, FNP  Brief History/Interval Summary: 56 year old African-American male with a past medical history of coronary artery disease status post CABG and multiple PCI, chronic kidney disease stage III, diabetes, chronic systolic and diastolic dysfunction per echocardiogram done in April 2018, chronic anemia, sleep apnea, presenting with complaints of shortness of breath. Found to be in acute congestive heart failure. Patient hospitalized for further management.  Reason for Visit: Acute systolic CHF  Consultants: None  Procedures:  Transthoracic echocardiogram 12/18/16 Study Conclusions  - Left ventricle: The cavity size was normal. There was moderate   concentric hypertrophy. Systolic function was mildly reduced. The   estimated ejection fraction was in the range of 45% to 50%.   Anterior, distal anteroseptal, apical and distal inferoapical   akinesis suggestive of LAD territory ischemia/infarct. No apical   thrombus with definity contrast. Doppler parameters are   consistent with abnormal left ventricular relaxation (grade 1   diastolic dysfunction). The E/e&' ratio is between 8-15,   suggesting indeterminate LV filling pressure. - Aortic valve: Trileaflet. Mildly calcifed with mild stenosis.   There was no regurgitation. Mean gradient (S): 14 mm Hg. Peak   gradient (S): 27 mm Hg. Valve area (VTI): 1.87 cm^2. Valve area   (Vmax): 1.86 cm^2. Valve area (Vmean): 1.85 cm^2. - Mitral valve: Mildly thickened leaflets . There was trivial   regurgitation. - Left atrium: Moderately dilated. - Right ventricle: The cavity size was mildly dilated. Systolic   function is mildly reduced. - Tricuspid valve: There was trivial regurgitation. - Pulmonary arteries: PA peak pressure: 20 mm Hg (S). - Inferior vena cava: The vessel was normal in size. The   respirophasic diameter  changes were in the normal range (>= 50%),   consistent with normal central venous pressure.  Impressions:  - Compared to a prior study in 2016, the LVEF is lower at 45-50%   with LAD territory wall motion abnormalities. There is mild   calcific aortic valve stenosis.  Antibiotics: None  Subjective/Interval History: Patient feels better this morning. He has urinated overnight. Denies any chest pain. Breathing has improved.  ROS: No nausea or vomiting  Objective:  Vital Signs  Vitals:   02/08/17 0346 02/08/17 0421 02/08/17 0424 02/08/17 0743  BP:   (!) 149/78 (!) 145/76  Pulse:   84 80  Resp:      Temp:   98.7 F (37.1 C) 98.4 F (36.9 C)  TempSrc:   Oral Oral  SpO2: 99%  99% 96%  Weight:  105.9 kg (233 lb 6.4 oz)    Height:  5\' 5"  (1.651 m)      Intake/Output Summary (Last 24 hours) at 02/08/17 0911 Last data filed at 02/08/17 5053  Gross per 24 hour  Intake              600 ml  Output             1150 ml  Net             -550 ml   Filed Weights   02/08/17 0421  Weight: 105.9 kg (233 lb 6.4 oz)    General appearance: alert, cooperative, appears stated age and no distress Resp: Crackles noted at the bases. No wheezing. Good air entry. Normal effort Cardio: regular rate and rhythm, S1, S2 normal, no murmur, click, rub or gallop GI: soft, non-tender; bowel sounds normal; no masses,  no organomegaly Extremities: edema 1+ bilateral lower extremity Neurologic: No focal deficits  Lab Results:  Data Reviewed: I have personally reviewed following labs and imaging studies  CBC:  Recent Labs Lab 02/07/17 2202 02/08/17 0413  WBC 8.1 9.2  NEUTROABS  --  6.5  HGB 9.3* 9.8*  HCT 29.6* 31.5*  MCV 87.6 88.5  PLT 229 701    Basic Metabolic Panel:  Recent Labs Lab 02/07/17 2202 02/08/17 0413  NA 138 140  K 4.4 4.7  CL 112* 112*  CO2 17* 18*  GLUCOSE 121* 129*  BUN 28* 27*  CREATININE 2.44* 2.50*  CALCIUM 9.6 9.7    GFR: Estimated Creatinine  Clearance: 36.6 mL/min (A) (by C-G formula based on SCr of 2.5 mg/dL (H)).  Liver Function Tests:  Recent Labs Lab 02/08/17 0413  AST 26  ALT 29  ALKPHOS 58  BILITOT 0.7  PROT 7.3  ALBUMIN 3.8     Cardiac Enzymes:  Recent Labs Lab 02/08/17 0413  TROPONINI <0.03    CBG:  Recent Labs Lab 02/08/17 0414 02/08/17 0734  GLUCAP 134* 157*     Radiology Studies: Dg Chest 2 View  Result Date: 02/07/2017 CLINICAL DATA:  Acute onset of shortness of breath and central chest pain. Productive cough. Initial encounter. EXAM: CHEST  2 VIEW COMPARISON:  Chest radiograph performed 11/15/2016 FINDINGS: The lungs are well-aerated. Vascular congestion is noted. Bibasilar airspace opacities raise concern for pulmonary edema, though pneumonia could have a similar appearance. There is no evidence of pleural effusion or pneumothorax. The heart is mildly enlarged. The patient is status post median sternotomy. No acute osseous abnormalities are seen. IMPRESSION: Vascular congestion and mild cardiomegaly. Bibasilar airspace opacities raise concern for pulmonary edema, though pneumonia could have a similar appearance. Electronically Signed   By: Garald Balding M.D.   On: 02/07/2017 22:34     Medications:  Scheduled: . amLODipine  10 mg Oral Daily  . aspirin  81 mg Oral Daily  . atorvastatin  80 mg Oral q1800  . carvedilol  50 mg Oral BID WC  . clopidogrel  75 mg Oral Daily  . furosemide      . furosemide  40 mg Intravenous Q12H  . gabapentin  300 mg Oral TID  . heparin  5,000 Units Subcutaneous Q8H  . hydrALAZINE  50 mg Oral TID  . insulin aspart  0-9 Units Subcutaneous TID WC  . isosorbide mononitrate  240 mg Oral Daily  . multivitamin with minerals  1 tablet Oral Daily  . omega-3 acid ethyl esters  2 g Oral BID  . pantoprazole  40 mg Oral Daily  . vitamin E  400 Units Oral Daily   Continuous:  XBL:[id number] **OR** acetaminophen, nitroGLYCERIN, ondansetron **OR** ondansetron  (ZOFRAN) IV, traMADol, zolpidem  Assessment/Plan:  Principal Problem:   Acute on chronic diastolic CHF (congestive heart failure) (HCC) Active Problems:   Essential hypertension   OSA on CPAP   Type 2 diabetes mellitus with renal manifestations ([place])   Chest pain   CAD- CABG 2006, RCA DES March 2015   CRI (chronic renal insufficiency), stage 3 (moderate)   CHF (congestive heart failure) (HCC)    Acute on chronic systolic and diastolic CHF. Ejection fraction on his last echocardiogram done in April was 45-50%. He was also found to have grade 1 diastolic dysfunction. X-ray suggested pulmonary edema. Patient was given IV Lasix with improvement. Continue Lasix for now. No ACE inhibitor or ARB due to renal failure. D-dimer was noted  to be mildly abnormal. However, his symptoms are more consistent with CHF and symptoms improved with diuretics. No need for further evaluation. Strict ins and outs. Daily weights.  Chest pain with a known history of coronary artery disease. Pain improved with sublingual nitroglycerin. Troponin was normal. He is chest pain-free currently. Continue medical management including nitrates. Recent stress test in March did not show any reversible ischemia. Continue to monitor for now. Outpatient follow-up with cardiology.  Essential hypertension. Monitor blood pressures. Continue home medication.  Chronic kidney disease stage III. Creatinine appears to be at baseline. Monitor closely while on diuretics.  Diabetes mellitus type 2. Continue to monitor CBGs. Continue sliding scale insulin coverage.  Anemia of chronic disease. Hemoglobin close to baseline.  DVT Prophylaxis: Subcutaneous heparin    Code Status: Full code  Family Communication: Discussed with the patient  Disposition Plan: Management as outlined above. Mobilize as tolerated    LOS: 0 days   [facility] Pager [phone] 02/08/2017, 9:11 AM  If 7PM-7AM, please contact  night-coverage at [email or web address], password [facility]

## 2017-02-08 NOTE — Progress Notes (Signed)
Pt is alert oriented no pain with pain tolerated walking with no distress. On the CPAP

## 2017-02-08 NOTE — ED Notes (Signed)
Attempt IV x1 by this RN.  Spoke with family.

## 2017-02-08 NOTE — ED Provider Notes (Signed)
By signing my name below, I, Margit Banda, attest that this documentation has been prepared under the direction and in the presence of Ward, Delice Bison, DO. Electronically Signed: Margit Banda, ED Scribe. 02/08/17. 1:01 AM.  TIME SEEN: 12:50 AM  CHIEF COMPLAINT: Shortness of Breath  HPI: Matthew Rowe. is a 56 y.o. male with a PMHx of CAD, CKD, DM and HTN, who presents to the Emergency Department complaining of sudden SOB that started yesterday, 02/06/17. Associated sx include central chest pressure without radiation, nonproductive cough, and chills (that he reports is not something abnormal for him). Sob is exacerbated when he walks around and lays flat. Has had similar sx in the past. Last catheterization ~ 1.5 years ago. Stress test 11/16/16 that was deemed high risk. Family reports that they did not want to proceed with another cardiac catheterization because of the risk of needing to be on dialysis afterwards. Pt not on dialysis. He is taking hydrochlorothiazide. No other diuretics. No fever, nausea, vomiting, diaphoresis. No lower extremity swelling or pain. No history of PE or DVT.  PCP - [facility]  >>>>>>>Cath/PCI:  LHC (12/02/13): LMCA with 30% distal stenosis. LAD with multiple stents and 50% ostial stenosis followed by 20% diffuse in-stent restenosis proximally. There is diffuse 50% in-stent restenosis in the mid and distal segments. The distal LAD is occluded. LCx is small with 4050% ostial stenosis. OM 2 has a 50% ostial stenosis. Ramus intermedius is a large vessel with 70-80% stenosis involving its superior branch. 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 There is diffuse 20% proximal disease. There is a 90% stenosis involving the distal vessel just beyond the anastomosis with the RIMA. 60% stenosis extending into the RPDA is also evident. RIMA to RCA is widely patent with aforementioned stenosis just beyond the distal anastomosis.  PCI (12/03/13): Successful PCI to  the mid/distal RCA with overlapping Promus drug-eluting stents (2.25 x 24 and 2.25 x 16 mm).   Echo 12/18/16:  Study Conclusions  - Left ventricle: The cavity size was normal. There was moderate   concentric hypertrophy. Systolic function was mildly reduced. The   estimated ejection fraction was in the range of 45% to 50%.   Anterior, distal anteroseptal, apical and distal inferoapical   akinesis suggestive of LAD territory ischemia/infarct. No apical   thrombus with definity contrast. Doppler parameters are   consistent with abnormal left ventricular relaxation (grade 1   diastolic dysfunction). The E/e&' ratio is between 8-15,   suggesting indeterminate LV filling pressure. - Aortic valve: Trileaflet. Mildly calcifed with mild stenosis.   There was no regurgitation. Mean gradient (S): 14 mm Hg. Peak   gradient (S): 27 mm Hg. Valve area (VTI): 1.87 cm^2. Valve area   (Vmax): 1.86 cm^2. Valve area (Vmean): 1.85 cm^2. - Mitral valve: Mildly thickened leaflets . There was trivial   regurgitation. - Left atrium: Moderately dilated. - Right ventricle: The cavity size was mildly dilated. Systolic   function is mildly reduced. - Tricuspid valve: There was trivial regurgitation. - Pulmonary arteries: PA peak pressure: 20 mm Hg (S). - Inferior vena cava: The vessel was normal in size. The   respirophasic diameter changes were in the normal range (>= 50%),   consistent with normal central venous pressure.  Impressions:  - Compared to a prior study in 2016, the LVEF is lower at 45-50%   with LAD territory wall motion abnormalities. There is mild   calcific aortic valve stenosis.  ROS: See HPI Constitutional:  no fever, + chills Eyes: no drainage  ENT: no runny nose   Cardiovascular: + chest pain  Resp: + SOB, + nonproductive cough GI: no vomiting, no nausea GU: no dysuria Integumentary: no rash  Allergy: no hives  Musculoskeletal: no leg swelling  Neurological: no slurred  speech ROS otherwise negative  PAST MEDICAL HISTORY/PAST SURGICAL HISTORY:  Past Medical History:  Diagnosis Date  . Anginal pain ([place])   . CKD (chronic kidney disease), stage III   . Coronary artery disease    a. INF MI 1999: PCI of RCA;  b.  extensive stenting of LAD;  c. s/p CABG with RIMA->RCA in 2006;  d. Stable, Low risk MV 05/2012; e. 09/2012 Cath: stable anatomy->med Rx. f. Abnl nuc 11/2013 -> med rx; g. 11/2013 Cath/PCI: LM 30d, LAD 50ost, 20/50isr, 100d, LCX 40-50ost, OM2 50ost, RI 70-80ost sup branch, RCA 20p, 90/60d s/p DES, PDA 60p, RIMA->RCA ok.  d. 11/2016: nuc with EF 35%, no reversible ischem  . DM2 (diabetes mellitus, type 2) ([place])   . Dyslipidemia   . GERD (gastroesophageal reflux disease)   . History of gout   . Hx of echocardiogram    a. Echo 3/12: mild LVH, EF 55-60%, trivial AI  . Hypertensive heart disease   . Myocardial infarction [facility]) 1999; 2002; 2003; 2006; ~ 2008  . OSA on CPAP   . Polycystic kidney disease    MEDICATIONS:  Prior to Admission medications   Medication Sig Start Date End Date Taking? Authorizing Provider  acetaminophen (TYLENOL) 500 MG tablet Take 1,000 mg by mouth every 8 (eight) hours as needed for mild pain.     [provider]  amLODipine (NORVASC) 10 MG tablet Take 1 tablet (10 mg total) by mouth daily. 01/12/15   Copland, Gay Filler, MD  aspirin 81 MG tablet Take 1 tablet (81 mg total) by mouth daily. 12/04/13   Rogelia Mire, NP  atorvastatin (LIPITOR) 80 MG tablet Take 1 tablet (80 mg total) by mouth daily. 11/16/16 02/14/17  Eileen Stanford, PA-C  carvedilol (COREG) 25 MG tablet TAKE TWO TABLETS BY MOUTH TWICE DAILY 09/28/16   End, Harrell Gave, MD  clopidogrel (PLAVIX) 75 MG tablet TAKE ONE TABLET BY MOUTH ONCE DAILY WITH BREAKFAST 12/25/16   End, Harrell Gave, MD  diphenhydrAMINE (BENADRYL) 25 mg capsule Take 25 mg by mouth daily as needed for allergies.    [provider]  diphenhydramine-acetaminophen (TYLENOL PM)  25-500 MG TABS tablet Take 1 tablet by mouth at bedtime.    [provider]  gabapentin (NEURONTIN) 300 MG capsule Take 300 mg by mouth 3 (three) times daily.    [provider]  hydrALAZINE (APRESOLINE) 50 MG tablet Take 1 tablet (50 mg total) by mouth 3 (three) times daily. 12/20/16   End, Harrell Gave, MD  hydrochlorothiazide (HYDRODIURIL) 25 MG tablet Take 25 mg by mouth daily.    [provider]  isosorbide mononitrate (IMDUR) 120 MG 24 hr tablet Take 2 tablets (240 mg total) by mouth daily. 08/11/16   Erlene Quan, PA-C  metFORMIN (GLUCOPHAGE) 1000 MG tablet Take 1,000 mg by mouth 2 (two) times daily with a meal.    [provider]  Multiple Vitamin (MULTIVITAMIN WITH MINERALS) TABS tablet Take 1 tablet by mouth daily.    [provider]  nitroGLYCERIN (NITROLINGUAL) 0.4 MG/SPRAY spray Place 1 spray under the tongue every 5 (five) minutes x 3 doses as needed for chest pain. 12/25/16   End, Harrell Gave, MD  Omega-3 Fatty  Acids (FISH OIL) 1200 MG CAPS Take 2,400 mg by mouth 2 (two) times daily.  02/03/15   Larey Dresser, MD  pantoprazole (PROTONIX) 40 MG tablet Take 40 mg by mouth daily.     [provider]  ranolazine (RANEXA) 500 MG 12 hr tablet Take 1 tablet (500 mg total) by mouth 2 (two) times daily. 11/16/16   Eileen Stanford, PA-C  traMADol (ULTRAM) 50 MG tablet Take 50 mg by mouth every 8 (eight) hours as needed for moderate pain.    [provider]  vitamin E 400 UNIT capsule Take 400 Units by mouth daily.    [provider]  zolpidem (AMBIEN) 10 MG tablet Take 10 mg by mouth at bedtime as needed for sleep.    [provider]   ALLERGIES:  Allergies  Allergen Reactions  . Ciprocin-Fluocin-Procin [Fluocinolone Acetonide] Rash  . Clarithromycin Itching and Other (See Comments)    "Biaxin" Eyes itch and burn  . Cleocin [Clindamycin Hcl] Anaphylaxis and Swelling  . Glimepiride [Amaryl] Other (See  Comments)    Elevates liver function   . Colchicine     Affected kidneys   SOCIAL HISTORY:  Social History  Substance Use Topics  . Smoking status: Former Smoker    Types: Cigarettes  . Smokeless tobacco: Never Used     Comment: smoked some as a teenager (high school) not even a pack a week  . Alcohol use No     Comment: 09/15/2012 "drank a little when I was young"   FAMILY HISTORY: Family History  Problem Relation Age of Onset  . Lung cancer Mother   . Anemia Mother   . Polycystic kidney disease Mother   . Diabetes Father   . Heart attack Father 54       Died suddenly  . Heart failure Father   . Hyperlipidemia Father   . Hypertension Father   . Kidney failure Sister 5       Died  . Heart attack Sister   . Hypertension Sister   . Diabetes Sister   . Diabetes Brother   . Polycystic kidney disease Brother   . Diabetes Sister    EXAM: BP 127/75   Pulse 84   Temp 98.7 F (37.1 C) (Oral)   Resp (!) 28   SpO2 91%    CONSTITUTIONAL: Alert and oriented and responds appropriately to questions. Obese, chronically ill-appearing HEAD: Normocephalic EYES: Conjunctivae clear, pupils appear equal, EOMI ENT: normal nose; moist mucous membranes NECK: Supple, no meningismus, no nuchal rigidity, no LAD  CARD: RRR; S1 and S2 appreciated; no murmurs, no clicks, no rubs, no gallops RESP: breath sounds clear and equal bilaterally; no wheezes, no rhonchi, no rales, no hypoxia or respiratory distress, speaking short sentences, tachypneic with respiratory rate in the 40s, SpO2 is 91% RA at rest. ABD/GI: Normal bowel sounds; non-distended; soft, non-tender, no rebound, no guarding, no peritoneal signs, no hepatosplenomegaly BACK:  The back appears normal and is non-tender to palpation, there is no CVA tenderness EXT: Normal ROM in all joints; non-tender to palpation; no edema; normal capillary refill; no cyanosis, no calf tenderness or swelling    SKIN: Normal color for age and race; warm;  no rash NEURO: Moves all extremities equally PSYCH: The patient's mood and manner are appropriate. Grooming and personal hygiene are appropriate.  MEDICAL DECISION MAKING: Patient here with complaints of shortness of breath and chest pressure. Has significant cardiac history. Last echocardiogram in April 2018 showed EF of  45-50%. He does complain of nonproductive cough but no fevers, rigors. Chest x-ray shows bibasilar edema versus pneumonia. He is afebrile here with no leukocytosis and does not have consistent or productive cough. I feel this is more likely fluid. Will give dose of IV Lasix. We'll give aspirin and nitroglycerin for his chest pressure. Patient states he was previously on Ranexa which was helping his symptoms but he can no longer afford this medication. States that Imdur does not help him. He is also risk for ulnar embolus. We'll obtain a d-dimer. He cannot obtain a CT scan with contrast because of his chronic kidney disease. Given he has so tachypneic at rest and has a sat in the low 90s at rest, I feel patient will need admission.  ED PROGRESS: 2:30 AM  Pt's BNP elevated at 962. I feel this is pulmonary edema. Discussed with Dr. Hassell Done with cardiology service. She feels it is reasonable to admit the patient to medicine service.  3:00 AM  Pt's d-dimer slightly positive. We'll order a VQ scan for the morning.  3:13 AM Discussed patient's case with hospitalist, Dr. Hal Hope.  I have recommended admission and patient (and family if present) agree with this plan. Admitting physician will place admission orders.   I reviewed all nursing notes, vitals, pertinent previous records, EKGs, lab and urine results, imaging (as available).     EKG Interpretation  Date/Time:  Thursday Feb 07 2017 21:59:14 EDT Ventricular Rate:  87 PR Interval:  150 QRS Duration: 108 QT Interval:  358 QTC Calculation: 430 R Axis:   -93 Text Interpretation:  Normal sinus rhythm Possible Left atrial  enlargement Right superior axis deviation Anterior infarct , age undetermined Abnormal ECG No significant change since last tracing Confirmed by Pryor Curia [id number]) on 02/08/2017 1:18:07 AM        I personally performed the services described in this documentation, which was scribed in my presence. The recorded information has been reviewed and is accurate.     Ward, Delice Bison, DO 02/08/17 [phone]

## 2017-02-08 NOTE — H&P (Signed)
History and Physical    Matthew Rowe JAS:[id number] DOB: October 16, 1960 DOA: 02/08/2017  PCP: Barrie Lyme, FNP  Patient coming from: Home.  Chief Complaint: Chest pain and shortness of breath.  HPI: Matthew Rowe. is a 56 y.o. male with history of CAD status post CABG and multiple PCI, chronic kidney disease stage III, diabetes mellitus, diastolic and systolic dysfunction per 2-D echo done in April 2018, chronic anemia and sleep apnea presents to the ER with complaints of increasing shortness of breath or chest discomfort since yesterday morning. Patient gets short of breath on lying flat and increases on exertion. Also has been having substernal chest pressure constant since yesterday morning. Denies any nausea vomiting abdominal pain or diaphoresis. Patient was prescribed Ranexa during March 3 months ago which patient states helped his chest pain but was not able to afford it after a month due to cost. Patient had called his primary care and asked for hydrochlorothiazide which was discontinued a month ago due to renal failure. Despite taking the hydrochlorothiazide patient was still short of breath.  ED Course: In the ER patient's chest pain improved with sublingual nitroglycerin. Chest x-ray shows possible pulmonary edema. Patient was given Lasix 40 mg IV and admitted for further management.  Review of Systems: As per HPI, rest all negative.   Past Medical History:  Diagnosis Date  . Anginal pain ([place])   . CKD (chronic kidney disease), stage III   . Coronary artery disease    a. INF MI 1999: PCI of RCA;  b.  extensive stenting of LAD;  c. s/p CABG with RIMA->RCA in 2006;  d. Stable, Low risk MV 05/2012; e. 09/2012 Cath: stable anatomy->med Rx. f. Abnl nuc 11/2013 -> med rx; g. 11/2013 Cath/PCI: LM 30d, LAD 50ost, 20/50isr, 100d, LCX 40-50ost, OM2 50ost, RI 70-80ost sup branch, RCA 20p, 90/60d s/p DES, PDA 60p, RIMA->RCA ok.  d. 11/2016: nuc with EF 35%, no reversible ischem  . DM2  (diabetes mellitus, type 2) ([place])   . Dyslipidemia   . GERD (gastroesophageal reflux disease)   . History of gout   . Hx of echocardiogram    a. Echo 3/12: mild LVH, EF 55-60%, trivial AI  . Hypertensive heart disease   . Myocardial infarction [facility]) 1999; 2002; 2003; 2006; ~ 2008  . OSA on CPAP   . Polycystic kidney disease     Past Surgical History:  Procedure Laterality Date  . CARDIAC CATHETERIZATION  2012  . CARDIAC CATHETERIZATION  2013  . CARDIAC CATHETERIZATION  2014   LHC (1/14): total occlusion of distal LAD, diffuse disease in ramus, 70% proximal LCx, 50% proximal and mid RCA, 50% distal RCA, patent RIMA-RCA.  Marland Kitchen CORONARY ANGIOPLASTY WITH STENT PLACEMENT  1999 / 2002 / 2004 / 2006?  . CORONARY ARTERY BYPASS GRAFT  2006   CABG X?; in [place]  . CYSTECTOMY  1990's   off face  . LEFT HEART CATHETERIZATION WITH CORONARY/GRAFT ANGIOGRAM N/A 09/16/2012   Procedure: LEFT HEART CATHETERIZATION WITH [name];  Surgeon: [name], MD;  Location: [facility] CATH LAB;  Service: Cardiovascular;  Laterality: N/A;  . LEFT HEART CATHETERIZATION WITH CORONARY/GRAFT ANGIOGRAM N/A 12/02/2013   Procedure: LEFT HEART CATHETERIZATION WITH [name];  Surgeon: [name], MD;  Location: [place] [facility] CATH LAB;  Service: Cardiovascular;  Laterality: N/A;  . PERCUTANEOUS CORONARY STENT INTERVENTION (PCI-S) N/A 12/03/2013   Procedure: PERCUTANEOUS CORONARY STENT INTERVENTION (PCI-S);  Surgeon: [name], MD;  Location: [facility] CATH  LAB;  Service: Cardiovascular;  Laterality: N/A;     reports that he has quit smoking. His smoking use included Cigarettes. He has never used smokeless tobacco. He reports that he does not drink alcohol or use drugs.  Allergies  Allergen Reactions  . Ciprocin-Fluocin-Procin [Fluocinolone Acetonide] Rash  . Clarithromycin Itching and Other (See Comments)    "Biaxin" Eyes itch and burn  . Cleocin [Clindamycin Hcl] Anaphylaxis and Swelling  .  Glimepiride [Amaryl] Other (See Comments)    Elevates liver function   . Colchicine     Affected kidneys    Family History  Problem Relation Age of Onset  . Lung cancer Mother   . Anemia Mother   . Polycystic kidney disease Mother   . Diabetes Father   . Heart attack Father 65       Died suddenly  . Heart failure Father   . Hyperlipidemia Father   . Hypertension Father   . Kidney failure Sister 5       Died  . Heart attack Sister   . Hypertension Sister   . Diabetes Sister   . Diabetes Brother   . Polycystic kidney disease Brother   . Diabetes Sister     Prior to Admission medications   Medication Sig Start Date End Date Taking? Authorizing Provider  amLODipine (NORVASC) 10 MG tablet Take 1 tablet (10 mg total) by mouth daily. 01/12/15  Yes Copland, Gay Filler, MD  aspirin 81 MG tablet Take 1 tablet (81 mg total) by mouth daily. 12/04/13  Yes Rogelia Mire, NP  atorvastatin (LIPITOR) 80 MG tablet Take 1 tablet (80 mg total) by mouth daily. 11/16/16 02/14/17 Yes Eileen Stanford, PA-C  carvedilol (COREG) 25 MG tablet TAKE TWO TABLETS BY MOUTH TWICE DAILY 09/28/16  Yes End, Harrell Gave, MD  clopidogrel (PLAVIX) 75 MG tablet TAKE ONE TABLET BY MOUTH ONCE DAILY WITH BREAKFAST 12/25/16  Yes End, Harrell Gave, MD  diphenhydramine-acetaminophen (TYLENOL PM) 25-500 MG TABS tablet Take 1 tablet by mouth at bedtime.   Yes [provider]  gabapentin (NEURONTIN) 300 MG capsule Take 300 mg by mouth 3 (three) times daily.   Yes [provider]  hydrALAZINE (APRESOLINE) 50 MG tablet Take 1 tablet (50 mg total) by mouth 3 (three) times daily. 12/20/16  Yes End, Harrell Gave, MD  hydrochlorothiazide (HYDRODIURIL) 25 MG tablet Take 25 mg by mouth daily.   Yes [provider]  isosorbide mononitrate (IMDUR) 120 MG 24 hr tablet Take 2 tablets (240 mg total) by mouth daily. 08/11/16  Yes Kilroy, Luke K, PA-C  metFORMIN (GLUCOPHAGE) 1000 MG tablet Take 1,000 mg by mouth 2  (two) times daily with a meal.   Yes [provider]  Multiple Vitamin (MULTIVITAMIN WITH MINERALS) TABS tablet Take 1 tablet by mouth daily.   Yes [provider]  nitroGLYCERIN (NITROLINGUAL) 0.4 MG/SPRAY spray Place 1 spray under the tongue every 5 (five) minutes x 3 doses as needed for chest pain. 12/25/16  Yes End, Harrell Gave, MD  Omega-3 Fatty Acids (FISH OIL) 1200 MG CAPS Take 2,400 mg by mouth 2 (two) times daily.  02/03/15  Yes Larey Dresser, MD  pantoprazole (PROTONIX) 40 MG tablet Take 40 mg by mouth daily.    Yes [provider]  traMADol (ULTRAM) 50 MG tablet Take 50 mg by mouth every 8 (eight) hours as needed for moderate pain.   Yes [provider]  vitamin E 400 UNIT capsule Take 400 Units by mouth daily.  Yes [provider]  zolpidem (AMBIEN) 10 MG tablet Take 10 mg by mouth at bedtime as needed for sleep.   Yes [provider]  ranolazine (RANEXA) 500 MG 12 hr tablet Take 1 tablet (500 mg total) by mouth 2 (two) times daily. Patient not taking: Reported on 02/08/2017 11/16/16   Eileen Stanford, PA-C    Physical Exam: Vitals:   02/08/17 0045 02/08/17 0215 02/08/17 0230 02/08/17 0330  BP: 127/75 (!) 145/82 (!) 146/86 138/81  Pulse: 84 87 86 84  Resp: (!) 28 (!) 31 (!) 24 (!) 28  Temp:      TempSrc:      SpO2: 91% 96% 96% 96%      Constitutional: Moderately built and nourished. Vitals:   02/08/17 0045 02/08/17 0215 02/08/17 0230 02/08/17 0330  BP: 127/75 (!) 145/82 (!) 146/86 138/81  Pulse: 84 87 86 84  Resp: (!) 28 (!) 31 (!) 24 (!) 28  Temp:      TempSrc:      SpO2: 91% 96% 96% 96%   Eyes: Anicteric. No pallor. ENMT: No discharge from the ears eyes nose or mouth. Neck: Mild JVD elevation no mass felt. Respiratory: No rhonchi but has basal crepitations. Cardiovascular: S1 and S2 heard. Abdomen: Soft nontender bowel sounds present. Musculoskeletal: No edema. No joint effusion. Skin: No rash. Skin  appears warm. Neurologic: Alert awake oriented to time place and person. Moves all extremities. Psychiatric: Appears normal. Normal affect.   Labs on Admission: I have personally reviewed following labs and imaging studies  CBC:  Recent Labs Lab 02/07/17 2202  WBC 8.1  HGB 9.3*  HCT 29.6*  MCV 87.6  PLT 226   Basic Metabolic Panel:  Recent Labs Lab 02/07/17 2202  NA 138  K 4.4  CL 112*  CO2 17*  GLUCOSE 121*  BUN 28*  CREATININE 2.44*  CALCIUM 9.6   GFR: CrCl cannot be calculated (Unknown ideal weight.). Liver Function Tests: No results for input(s): AST, ALT, ALKPHOS, BILITOT, PROT, ALBUMIN in the last 168 hours. No results for input(s): LIPASE, AMYLASE in the last 168 hours. No results for input(s): AMMONIA in the last 168 hours. Coagulation Profile: No results for input(s): INR, PROTIME in the last 168 hours. Cardiac Enzymes: No results for input(s): CKTOTAL, CKMB, CKMBINDEX, TROPONINI in the last 168 hours. BNP (last 3 results) No results for input(s): PROBNP in the last 8760 hours. HbA1C: No results for input(s): HGBA1C in the last 72 hours. CBG: No results for input(s): GLUCAP in the last 168 hours. Lipid Profile: No results for input(s): CHOL, HDL, LDLCALC, TRIG, CHOLHDL, LDLDIRECT in the last 72 hours. Thyroid Function Tests: No results for input(s): TSH, T4TOTAL, FREET4, T3FREE, THYROIDAB in the last 72 hours. Anemia Panel: No results for input(s): VITAMINB12, FOLATE, FERRITIN, TIBC, IRON, RETICCTPCT in the last 72 hours. Urine analysis:    Component Value Date/Time   COLORURINE YELLOW 07/17/2015 1425   APPEARANCEUR CLOUDY (A) 07/17/2015 1425   LABSPEC 1.013 07/17/2015 1425   PHURINE 6.0 07/17/2015 1425   GLUCOSEU 250 (A) 07/17/2015 1425   HGBUR NEGATIVE 07/17/2015 1425   BILIRUBINUR NEGATIVE 07/17/2015 1425   KETONESUR NEGATIVE 07/17/2015 1425   PROTEINUR NEGATIVE 07/17/2015 1425   UROBILINOGEN 1.0 07/17/2015 1425   NITRITE NEGATIVE  07/17/2015 1425   LEUKOCYTESUR NEGATIVE 07/17/2015 1425   Sepsis Labs: @LABRCNTIP (procalcitonin:4,lacticidven:4) )No results found for this or any previous visit (from the past 240 hour(s)).   Radiological Exams on Admission: Dg Chest 2 View  Result Date: 02/07/2017 CLINICAL DATA:  Acute onset of shortness of breath and central chest pain. Productive cough. Initial encounter. EXAM: CHEST  2 VIEW COMPARISON:  Chest radiograph performed 11/15/2016 FINDINGS: The lungs are well-aerated. Vascular congestion is noted. Bibasilar airspace opacities raise concern for pulmonary edema, though pneumonia could have a similar appearance. There is no evidence of pleural effusion or pneumothorax. The heart is mildly enlarged. The patient is status post median sternotomy. No acute osseous abnormalities are seen. IMPRESSION: Vascular congestion and mild cardiomegaly. Bibasilar airspace opacities raise concern for pulmonary edema, though pneumonia could have a similar appearance. Electronically Signed   By: Garald Balding M.D.   On: 02/07/2017 22:34    EKG: Independently reviewed. Normal sinus rhythm with nonspecific ST changes.  Assessment/Plan Principal Problem:   Acute on chronic diastolic CHF (congestive heart failure) (HCC) Active Problems:   Essential hypertension   OSA on CPAP   Type 2 diabetes mellitus with renal manifestations ([place])   Chest pain   CAD- CABG 2006, RCA DES March 2015   CRI (chronic renal insufficiency), stage 3 (moderate)   CHF (congestive heart failure) ([place])    1. Acute on chronic diastolic and systolic CHF with last EF measured in April 2018 was 45-50% with grade 1 diastolic dysfunction - I have placed patient on Lasix 40 mg IV every 12. Note on ARB or ACE due to renal failure. Follow intake and output daily weights and metabolic panel. D-dimer is mildly elevated but patient's symptoms are more consistent with CHF. Patient also does not want to have VQ scan. 2. Chest pain with  history of CAD - improved with sublingual nitroglycerin. Cycle cardiac markers. Continue Plavix and aspirin beta blocker and statin. Patient chest pain improved during last admission in March after starting Ranexa which patient was not able to afford. Stress test done during March 2018 was not showing any reversible ischemia. 3. Hypertension - on beta blocker hydralazine and amlodipine. 4. Sleep apnea on CPAP. 5. Chronic kidney disease stage III - creatinine appears to be at baseline. Closely follow metabolic panel since patient is on Lasix. 6. Diabetes mellitus type 2 - on sliding-scale coverage while inpatient. 7. Chronic anemia likely related to renal disease - follow CBC.   DVT prophylaxis: Heparin. Code Status: Full code.  Family Communication: Patient's wife.  Disposition Plan: Home.  Consults called: None.  Admission status: Inpatient.    Rise Patience MD [facility] Pager [phone].  If 7PM-7AM, please contact night-coverage [email or web address] Password [facility]  02/08/2017, 3:46 AM

## 2017-02-09 DIAGNOSIS — I5033 Acute on chronic diastolic (congestive) heart failure: Secondary | ICD-10-CM | POA: Diagnosis not present

## 2017-02-09 DIAGNOSIS — I130 Hypertensive heart and chronic kidney disease with heart failure and stage 1 through stage 4 chronic kidney disease, or unspecified chronic kidney disease: Secondary | ICD-10-CM | POA: Diagnosis not present

## 2017-02-09 DIAGNOSIS — R0602 Shortness of breath: Secondary | ICD-10-CM | POA: Diagnosis not present

## 2017-02-09 LAB — CBC
HEMATOCRIT: 29.4 % — AB (ref 39.0–52.0)
HEMOGLOBIN: 9.5 g/dL — AB (ref 13.0–17.0)
MCH: 28.1 pg (ref 26.0–34.0)
MCHC: 32.3 g/dL (ref 30.0–36.0)
MCV: 87 fL (ref 78.0–100.0)
Platelets: 228 10*3/uL (ref 150–400)
RBC: 3.38 MIL/uL — ABNORMAL LOW (ref 4.22–5.81)
RDW: 14.6 % (ref 11.5–15.5)
WBC: 6.7 10*3/uL (ref 4.0–10.5)

## 2017-02-09 LAB — BASIC METABOLIC PANEL
ANION GAP: 14 (ref 5–15)
BUN: 36 mg/dL — ABNORMAL HIGH (ref 6–20)
CO2: 18 mmol/L — ABNORMAL LOW (ref 22–32)
Calcium: 9.2 mg/dL (ref 8.9–10.3)
Chloride: 104 mmol/L (ref 101–111)
Creatinine, Ser: 2.76 mg/dL — ABNORMAL HIGH (ref 0.61–1.24)
GFR calc Af Amer: 28 mL/min — ABNORMAL LOW (ref >60)
GFR, EST NON AFRICAN AMERICAN: 24 mL/min — AB (ref >60)
GLUCOSE: 113 mg/dL — AB (ref 65–99)
POTASSIUM: 4 mmol/L (ref 3.5–5.1)
Sodium: 136 mmol/L (ref 135–145)

## 2017-02-09 LAB — GLUCOSE, CAPILLARY: Glucose-Capillary: 131 mg/dL — ABNORMAL HIGH (ref 65–99)

## 2017-02-09 MED ORDER — FUROSEMIDE 20 MG PO TABS: 40.0000 mg | ORAL_TABLET | Freq: Every day | ORAL | 1 refills | Status: DC

## 2017-02-09 MED ORDER — GUAIFENESIN 100 MG/5ML PO SOLN
5.0000 mL | ORAL | Status: DC | PRN
  Administered 2017-02-09: 100 mg via ORAL
  Filled 2017-02-09: qty 5

## 2017-02-09 NOTE — Progress Notes (Signed)
Pt has orders to be discharged. Discharge instructions given and pt has no additional questions at this time. Medication regimen reviewed and pt educated. Pt verbalized understanding and has no additional questions. Telemetry box removed. IV removed and site in good condition. Pt stable and waiting for transportation.  Tripp Goins RN 

## 2017-02-09 NOTE — Discharge Instructions (Signed)
Heart Failure °Heart failure is a condition in which the heart has trouble pumping blood because it has become weak or stiff. This means that the heart does not pump blood efficiently for the body to work well. For some people with heart failure, fluid may back up into the lungs and there may be swelling (edema) in the lower legs. Heart failure is usually a long-term (chronic) condition. It is important for you to take good care of yourself and follow the treatment plan from your health care provider. °What are the causes? °This condition is caused by some health problems, including: °· High blood pressure (hypertension). Hypertension causes the heart muscle to work harder than normal. High blood pressure eventually causes the heart to become stiff and weak. °· Coronary artery disease (CAD). CAD is the buildup of cholesterol and fat (plaques) in the arteries of the heart. °· Heart attack (myocardial infarction). Injured tissue, which is caused by the heart attack, does not contract as well and the heart's ability to pump blood is weakened. °· Abnormal heart valves. When the heart valves do not open and close properly, the heart muscle must pump harder to keep the blood flowing. °· Heart muscle disease (cardiomyopathy or myocarditis). Heart muscle disease is damage to the heart muscle from a variety of causes, such as drug or alcohol abuse, infections, or unknown causes. These can increase the risk of heart failure. °· Lung disease. When the lungs do not work properly, the heart must work harder. ° °What increases the risk? °Risk of heart failure increases as a person ages. This condition is also more likely to develop in people who: °· Are overweight. °· Are male. °· Smoke or chew tobacco. °· Abuse alcohol or illegal drugs. °· Have taken medicines that can damage the heart, such as chemotherapy drugs. °· Have diabetes. °? High blood sugar (glucose) is associated with high fat (lipid) levels in the blood. °? Diabetes  can also damage tiny blood vessels that carry nutrients to the heart muscle. °· Have abnormal heart rhythms. °· Have thyroid problems. °· Have low blood counts (anemia). ° °What are the signs or symptoms? °Symptoms of this condition include: °· Shortness of breath with activity, such as when climbing stairs. °· Persistent cough. °· Swelling of the feet, ankles, legs, or abdomen. °· Unexplained weight gain. °· Difficulty breathing when lying flat (orthopnea). °· Waking from sleep because of the need to sit up and get more air. °· Rapid heartbeat. °· Fatigue and loss of energy. °· Feeling light-headed, dizzy, or close to fainting. °· Loss of appetite. °· Nausea. °· Increased urination during the night (nocturia). °· Confusion. ° °How is this diagnosed? °This condition is diagnosed based on: °· Medical history, symptoms, and a physical exam. °· Diagnostic tests, which may include: °? Echocardiogram. °? Electrocardiogram (ECG). °? Chest X-ray. °? Blood tests. °? Exercise stress test. °? Radionuclide scans. °? Cardiac catheterization and angiogram. ° °How is this treated? °Treatment for this condition is aimed at managing the symptoms of heart failure. Medicines, behavioral changes, or other treatments may be necessary to treat heart failure. °Medicines °These may include: °· Angiotensin-converting enzyme (ACE) inhibitors. This type of medicine blocks the effects of a blood protein called angiotensin-converting enzyme. ACE inhibitors relax (dilate) the blood vessels and help to lower blood pressure. °· Angiotensin receptor blockers (ARBs). This type of medicine blocks the actions of a blood protein called angiotensin. ARBs dilate the blood vessels and help to lower blood pressure. °· Water   pills (diuretics). Diuretics cause the kidneys to remove salt and water from the blood. The extra fluid is removed through urination, leaving a lower volume of blood that the heart has to pump. °· Beta blockers. These improve heart  muscle strength and they prevent the heart from beating too quickly. °· Digoxin. This increases the force of the heartbeat. ° °Healthy behavior changes °These may include: °· Reaching and maintaining a healthy weight. °· Stopping smoking or chewing tobacco. °· Eating heart-healthy foods. °· Limiting or avoiding alcohol. °· Stopping use of street drugs (illegal drugs). °· Physical activity. ° °Other treatments °These may include: °· Surgery to open blocked coronary arteries or repair damaged heart valves. °· Placement of a biventricular pacemaker to improve heart muscle function (cardiac resynchronization therapy). This device paces both the right ventricle and left ventricle. °· Placement of a device to treat serious abnormal heart rhythms (implantable cardioverter defibrillator, or ICD). °· Placement of a device to improve the pumping ability of the heart (left ventricular assist device, or LVAD). °· Heart transplant. This can cure heart failure, and it is considered for certain patients who do not improve with other therapies. ° °Follow these instructions at home: °Medicines °· Take over-the-counter and prescription medicines only as told by your health care provider. Medicines are important in reducing the workload of your heart, slowing the progression of heart failure, and improving your symptoms. °? Do not stop taking your medicine unless your health care provider told you to do that. °? Do not skip any dose of medicine. °? Refill your prescriptions before you run out of medicine. You need your medicines every day. °Eating and drinking ° °· Eat heart-healthy foods. Talk with a dietitian to make an eating plan that is right for you. °? Choose foods that contain no trans fat and are low in saturated fat and cholesterol. Healthy choices include fresh or frozen fruits and vegetables, fish, lean meats, legumes, fat-free or low-fat dairy products, and whole-grain or high-fiber foods. °? Limit salt (sodium) if  directed by your health care provider. Sodium restriction may reduce symptoms of heart failure. Ask a dietitian to recommend heart-healthy seasonings. °? Use healthy cooking methods instead of frying. Healthy methods include roasting, grilling, broiling, baking, poaching, steaming, and stir-frying. °· Limit your fluid intake if directed by your health care provider. Fluid restriction may reduce symptoms of heart failure. °Lifestyle °· Stop smoking or using chewing tobacco. Nicotine and tobacco can damage your heart and your blood vessels. Do not use nicotine gum or patches before talking to your health care provider. °· Limit alcohol intake to no more than 1 drink per day for non-pregnant women and 2 drinks per day for men. One drink equals 12 oz of beer, 5 oz of wine, or 1½ oz of hard liquor. °? Drinking more than that is harmful to your heart. Tell your health care provider if you drink alcohol several times a week. °? Talk with your health care provider about whether any level of alcohol use is safe for you. °? If your heart has already been damaged by alcohol or you have severe heart failure, drinking alcohol should be stopped completely. °· Stop use of illegal drugs. °· Lose weight if directed by your health care provider. Weight loss may reduce symptoms of heart failure. °· Do moderate physical activity if directed by your health care provider. People who are elderly and people with severe heart failure should consult with a health care provider for physical activity recommendations. °  Monitor important information °· Weigh yourself every day. Keeping track of your weight daily helps you to notice excess fluid sooner. °? Weigh yourself every morning after you urinate and before you eat breakfast. °? Wear the same amount of clothing each time you weigh yourself. °? Record your daily weight. Provide your health care provider with your weight record. °· Monitor and record your blood pressure as told by your health  care provider. °· Check your pulse as told by your health care provider. °Dealing with extreme temperatures °· If the weather is extremely hot: °? Avoid vigorous physical activity. °? Use air conditioning or fans or seek a cooler location. °? Avoid caffeine and alcohol. °? Wear loose-fitting, lightweight, and light-colored clothing. °· If the weather is extremely cold: °? Avoid vigorous physical activity. °? Layer your clothes. °? Wear mittens or gloves, a hat, and a scarf when you go outside. °? Avoid alcohol. °General instructions °· Manage other health conditions such as hypertension, diabetes, thyroid disease, or abnormal heart rhythms as told by your health care provider. °· Learn to manage stress. If you need help to do this, ask your health care provider. °· Plan rest periods when fatigued. °· Get ongoing education and support as needed. °· Participate in or seek rehabilitation as needed to maintain or improve independence and quality of life. °· Stay up to date with immunizations. Keeping current on pneumococcal and influenza immunizations is especially important to prevent respiratory infections. °· Keep all follow-up visits as told by your health care provider. This is important. °Contact a health care provider if: °· You have a rapid weight gain. °· You have increasing shortness of breath that is unusual for you. °· You are unable to participate in your usual physical activities. °· You tire easily. °· You cough more than normal, especially with physical activity. °· You have any swelling or more swelling in areas such as your hands, feet, ankles, or abdomen. °· You are unable to sleep because it is hard to breathe. °· You feel like your heart is beating quickly (palpitations). °· You become dizzy or light-headed when you stand up. °Get help right away if: °· You have difficulty breathing. °· You notice or your family notices a change in your awareness, such as having trouble staying awake or having  difficulty with concentration. °· You have pain or discomfort in your chest. °· You have an episode of fainting (syncope). °This information is not intended to replace advice given to you by your health care provider. Make sure you discuss any questions you have with your health care provider. °Document Released: 08/27/2005 Document Revised: 05/01/2016 Document Reviewed: 03/21/2016 °Elsevier Interactive Patient Education © 2017 [employer] ° °

## 2017-02-09 NOTE — Progress Notes (Signed)
Patient refused am Heparin. Nurse educated patient on med. Patient verbalized understanding and still refused med.

## 2017-02-09 NOTE — Discharge Summary (Signed)
[facility]  Physician Discharge Summary   Patient ID: Matthew PRINTY Sr. MRN: [id number] DOB/AGE: 03-20-1961 56 y.o.  Admit date: 02/08/2017 Discharge date: 02/09/2017  PCP: Barrie Lyme, FNP  DISCHARGE DIAGNOSES:  Principal Problem:   Acute on chronic diastolic CHF (congestive heart failure) ([place]) Active Problems:   Essential hypertension   OSA on CPAP   Type 2 diabetes mellitus with renal manifestations ([place])   Chest pain   CAD- CABG 2006, RCA DES March 2015   CRI (chronic renal insufficiency), stage 3 (moderate)   CHF (congestive heart failure) ([facility])   RECOMMENDATIONS FOR OUTPATIENT FOLLOW UP: 1. Patient to follow-up with his primary care provider for blood work to check kidney function within one week 2. Patient to follow with his primary care physician to discuss alternative treatment for diabetes.   DISCHARGE CONDITION: fair  Diet recommendation: Modified carbohydrate  Filed Weights   02/08/17 0421 02/09/17 0540  Weight: 105.9 kg (233 lb 6.4 oz) 102.8 kg (226 lb 11.2 oz)    INITIAL HISTORY: 56 year old African-American male with a past medical history of coronary artery disease status post CABG and multiple PCI, chronic kidney disease stage III, diabetes, chronic systolic and diastolic dysfunction per echocardiogram done in April 2018, chronic anemia, sleep apnea, presenting with complaints of shortness of breath. Found to be in acute congestive heart failure. Patient hospitalized for further management.  Consultations:  None  Procedures:  None  HOSPITAL COURSE:   Acute on chronic systolic and diastolic CHF. Ejection fraction on his last echocardiogram done in April was 45-50%. He was also found to have grade 1 diastolic dysfunction. X-ray suggested pulmonary edema. Patient was given IV Lasix with improvement. He has diuresed well. Weight is decreased. He feels well. He will be discharged on furosemide. No ACE inhibitor or ARB due to renal  failure. D-dimer was noted to be mildly abnormal. However, his symptoms are more consistent with CHF and symptoms improved with diuretics. Patient has an appointment with his cardiologist on June 14.  Chest pain with a known history of coronary artery disease. Pain improved with sublingual nitroglycerin. Troponin was normal. He is chest pain-free. Continue medical management including nitrates. Recent stress test in March did not show any reversible ischemia. Outpatient follow-up with cardiology. Patient has an appointment with his cardiologist on June 14.  Essential hypertension. Monitor blood pressures. Continue home medication.  Chronic kidney disease stage III. Renal function was monitored closely while patient was getting diuretics. Creatinine noted to be slightly higher than his baseline. Patient does have good urine output. Some of this elevation in creatinine is due to diuretics. Patient will be discharged on oral Lasix today. He will benefit from blood work done at his primary care physician's office in the next few days to check renal function.  Diabetes mellitus type 2. Patient asked to stop taking Metformin due to his renal insufficiency.  His CBGs are only minimally elevated. Patient to discuss further management of diabetes with his primary care provider.  Anemia of chronic disease. Hemoglobin close to baseline.  Overall, stable. Ambulating in the hallway without any difficulty. Saturating normal. Okay for discharge.   PERTINENT LABS:  The results of significant diagnostics from this hospitalization (including imaging, microbiology, ancillary and laboratory) are listed below for reference.      Labs: Basic Metabolic Panel:  Recent Labs Lab 02/07/17 2202 02/08/17 0413 02/09/17 0345  NA 138 140 136  K 4.4 4.7 4.0  CL 112* 112* 104  CO2 17* 18*  18*  GLUCOSE 121* 129* 113*  BUN 28* 27* 36*  CREATININE 2.44* 2.50* 2.76*  CALCIUM 9.6 9.7 9.2   Liver Function  Tests:  Recent Labs Lab 02/08/17 0413  AST 26  ALT 29  ALKPHOS 58  BILITOT 0.7  PROT 7.3  ALBUMIN 3.8   CBC:  Recent Labs Lab 02/07/17 2202 02/08/17 0413 02/09/17 0345  WBC 8.1 9.2 6.7  NEUTROABS  --  6.5  --   HGB 9.3* 9.8* 9.5*  HCT 29.6* 31.5* 29.4*  MCV 87.6 88.5 87.0  PLT 229 220 228   Cardiac Enzymes:  Recent Labs Lab 02/08/17 0413 02/08/17 1147 02/08/17 1531  TROPONINI <0.03 <0.03 <0.03   BNP: BNP (last 3 results)  Recent Labs  04/04/16 0102 08/09/16 0908 02/08/17 0054  BNP 229.3* 179.2* 962.0*    CBG:  Recent Labs Lab 02/08/17 0734 02/08/17 1221 02/08/17 1551 02/08/17 2121 02/09/17 0740  GLUCAP 157* 109* 137* 141* 131*     IMAGING STUDIES Dg Chest 2 View  Result Date: 02/07/2017 CLINICAL DATA:  Acute onset of shortness of breath and central chest pain. Productive cough. Initial encounter. EXAM: CHEST  2 VIEW COMPARISON:  Chest radiograph performed 11/15/2016 FINDINGS: The lungs are well-aerated. Vascular congestion is noted. Bibasilar airspace opacities raise concern for pulmonary edema, though pneumonia could have a similar appearance. There is no evidence of pleural effusion or pneumothorax. The heart is mildly enlarged. The patient is status post median sternotomy. No acute osseous abnormalities are seen. IMPRESSION: Vascular congestion and mild cardiomegaly. Bibasilar airspace opacities raise concern for pulmonary edema, though pneumonia could have a similar appearance. Electronically Signed   By: Garald Balding M.D.   On: 02/07/2017 22:34    DISCHARGE EXAMINATION: Vitals:   02/08/17 1631 02/08/17 1957 02/09/17 0030 02/09/17 0540  BP: 136/76 134/79 129/82 140/81  Pulse: 77  83 85  Resp:   20 20  Temp: 98.3 F (36.8 C) 98.4 F (36.9 C) 98.2 F (36.8 C) 98.3 F (36.8 C)  TempSrc: Oral Oral Oral Oral  SpO2: 94% 95% 97% 96%  Weight:    102.8 kg (226 lb 11.2 oz)  Height:       General appearance: alert, cooperative, appears  stated age and no distress Resp: clear to auscultation bilaterally Cardio: regular rate and rhythm, S1, S2 normal, no murmur, click, rub or gallop GI: soft, non-tender; bowel sounds normal; no masses,  no organomegaly Extremities: Improving lower extremity edema   DISPOSITION: Home  Discharge Instructions    (HEART FAILURE PATIENTS) Call MD:  Anytime you have any of the following symptoms: 1) 3 pound weight gain in 24 hours or 5 pounds in 1 week 2) shortness of breath, with or without a dry hacking cough 3) swelling in the hands, feet or stomach 4) if you have to sleep on extra pillows at night in order to breathe.    Complete by:  As directed    Call MD for:  difficulty breathing, headache or visual disturbances    Complete by:  As directed    Call MD for:  extreme fatigue    Complete by:  As directed    Call MD for:  persistant nausea and vomiting    Complete by:  As directed    Call MD for:  severe uncontrolled pain    Complete by:  As directed    Call MD for:  temperature >100.4    Complete by:  As directed    Diet - low sodium  heart healthy    Complete by:  As directed    Diet Carb Modified    Complete by:  As directed    Discharge instructions    Complete by:  As directed    Please follow up with your PCP to discuss alternative treatment for diabetes and for blood work to check kidneys. You should not be Metformin any more due to kidney issues. Watch your diet and check blood sugar levels at home. Keep your appointment with Dr. Saunders Revel.  You were cared for by a hospitalist during your hospital stay. If you have any questions about your discharge medications or the care you received while you were in the hospital after you are discharged, you can call the unit and asked to speak with the hospitalist on call if the hospitalist that took care of you is not available. Once you are discharged, your primary care physician will handle any further medical issues. Please note that NO REFILLS  for any discharge medications will be authorized once you are discharged, as it is imperative that you return to your primary care physician (or establish a relationship with a primary care physician if you do not have one) for your aftercare needs so that they can reassess your need for medications and monitor your lab values. If you do not have a primary care physician, you can call [phone] for a physician referral.   Increase activity slowly    Complete by:  As directed       ALLERGIES:  Allergies  Allergen Reactions  . Ciprocin-Fluocin-Procin [Fluocinolone Acetonide] Rash  . Clarithromycin Itching and Other (See Comments)    "Biaxin" Eyes itch and burn  . Cleocin [Clindamycin Hcl] Anaphylaxis and Swelling  . Glimepiride [Amaryl] Other (See Comments)    Elevates liver function   . Colchicine     Affected kidneys     Discharge Medication List as of 02/09/2017 10:43 AM    START taking these medications   Details  furosemide (LASIX) 20 MG tablet Take 2 tablets (40 mg total) by mouth daily., Starting Sat 02/09/2017, Print      CONTINUE these medications which have NOT CHANGED   Details  amLODipine (NORVASC) 10 MG tablet Take 1 tablet (10 mg total) by mouth daily., Starting 01/12/2015, Until Discontinued, Normal    aspirin 81 MG tablet Take 1 tablet (81 mg total) by mouth daily., Starting 12/04/2013, Until Discontinued, No Print    atorvastatin (LIPITOR) 80 MG tablet Take 1 tablet (80 mg total) by mouth daily., Starting Fri 11/16/2016, Until Thu 02/14/2017, Normal    carvedilol (COREG) 25 MG tablet TAKE TWO TABLETS BY MOUTH TWICE DAILY, Normal    clopidogrel (PLAVIX) 75 MG tablet TAKE ONE TABLET BY MOUTH ONCE DAILY WITH BREAKFAST, Normal    diphenhydramine-acetaminophen (TYLENOL PM) 25-500 MG TABS tablet Take 1 tablet by mouth at bedtime., Historical Med    gabapentin (NEURONTIN) 300 MG capsule Take 300 mg by mouth 3 (three) times daily., Historical Med    hydrALAZINE (APRESOLINE) 50  MG tablet Take 1 tablet (50 mg total) by mouth 3 (three) times daily., Starting Thu 12/20/2016, Normal    isosorbide mononitrate (IMDUR) 120 MG 24 hr tablet Take 2 tablets (240 mg total) by mouth daily., Starting Sat 08/11/2016, Normal    Multiple Vitamin (MULTIVITAMIN WITH MINERALS) TABS tablet Take 1 tablet by mouth daily., Until Discontinued, Historical Med    nitroGLYCERIN (NITROLINGUAL) 0.4 MG/SPRAY spray Place 1 spray under the tongue every 5 (five) minutes x  3 doses as needed for chest pain., Starting Tue 12/25/2016, Normal    Omega-3 Fatty Acids (FISH OIL) 1200 MG CAPS Take 2,400 mg by mouth 2 (two) times daily. , Starting Thu 02/03/2015, Historical Med    pantoprazole (PROTONIX) 40 MG tablet Take 40 mg by mouth daily. , Historical Med    ranolazine (RANEXA) 500 MG 12 hr tablet Take 1 tablet (500 mg total) by mouth 2 (two) times daily., Starting Fri 11/16/2016, Normal    traMADol (ULTRAM) 50 MG tablet Take 50 mg by mouth every 8 (eight) hours as needed for moderate pain., Historical Med    vitamin E 400 UNIT capsule Take 400 Units by mouth daily., Until Discontinued, Historical Med    zolpidem (AMBIEN) 10 MG tablet Take 10 mg by mouth at bedtime as needed for sleep., Historical Med      STOP taking these medications     hydrochlorothiazide (HYDRODIURIL) 25 MG tablet      metFORMIN (GLUCOPHAGE) 1000 MG tablet          Follow-up Information    Barrie Lyme, FNP. Schedule an appointment as soon as possible for a visit in 5 day(s).   Specialty:  Nurse Practitioner Why:  to discuss alternative treatment for diabetes and for blood work to check kidneys Contact information: [place] [address] [phone]        Nelva Bush, MD Follow up.   Specialty:  Cardiology Why:  appt is on 02/21/17 Contact information: [place] [address] [phone]           TOTAL DISCHARGE TIME: 35  mins  [facility] Pager [phone]  02/09/2017, 4:34 PM

## 2017-02-18 ENCOUNTER — Other Ambulatory Visit: Payer: Self-pay | Admitting: Internal Medicine

## 2017-02-19 ENCOUNTER — Other Ambulatory Visit: Payer: Self-pay

## 2017-02-19 MED ORDER — CARVEDILOL 25 MG PO TABS: 50.0000 mg | ORAL_TABLET | Freq: Two times a day (BID) | ORAL | 9 refills | Status: DC

## 2017-02-21 ENCOUNTER — Ambulatory Visit (INDEPENDENT_AMBULATORY_CARE_PROVIDER_SITE_OTHER): Payer: Medicare Other | Admitting: Internal Medicine

## 2017-02-21 ENCOUNTER — Encounter: Payer: Self-pay | Admitting: Internal Medicine

## 2017-02-21 VITALS — BP 128/72 | HR 78 | Ht 65.0 in | Wt 231.2 lb

## 2017-02-21 DIAGNOSIS — I255 Ischemic cardiomyopathy: Secondary | ICD-10-CM | POA: Diagnosis not present

## 2017-02-21 DIAGNOSIS — I25118 Atherosclerotic heart disease of native coronary artery with other forms of angina pectoris: Secondary | ICD-10-CM | POA: Diagnosis not present

## 2017-02-21 DIAGNOSIS — E785 Hyperlipidemia, unspecified: Secondary | ICD-10-CM | POA: Diagnosis not present

## 2017-02-21 DIAGNOSIS — N183 Chronic kidney disease, stage 3 unspecified: Secondary | ICD-10-CM

## 2017-02-21 DIAGNOSIS — I5032 Chronic diastolic (congestive) heart failure: Secondary | ICD-10-CM | POA: Diagnosis not present

## 2017-02-21 DIAGNOSIS — I10 Essential (primary) hypertension: Secondary | ICD-10-CM | POA: Diagnosis not present

## 2017-02-21 LAB — BASIC METABOLIC PANEL
BUN/Creatinine Ratio: 13 (ref 9–20)
BUN: 36 mg/dL — ABNORMAL HIGH (ref 6–24)
CO2: 16 mmol/L — AB (ref 20–29)
CREATININE: 2.81 mg/dL — AB (ref 0.76–1.27)
Calcium: 8.8 mg/dL (ref 8.7–10.2)
Chloride: 104 mmol/L (ref 96–106)
GFR, EST AFRICAN AMERICAN: 28 mL/min/{1.73_m2} — AB (ref >59)
GFR, EST NON AFRICAN AMERICAN: 24 mL/min/{1.73_m2} — AB (ref >59)
Glucose: 154 mg/dL — ABNORMAL HIGH (ref 65–99)
Potassium: 4.7 mmol/L (ref 3.5–5.2)
SODIUM: 138 mmol/L (ref 134–144)

## 2017-02-21 NOTE — Patient Instructions (Signed)
Medication Instructions:  Your physician recommends that you continue on your current medications as directed. Please refer to the Current Medication list given to you today.   Labwork: BMET today  Testing/Procedures: None   Follow-Up: Your physician recommends that you schedule a follow-up appointment in: 1 month with Dr End.   Your physician recommends that you schedule a follow-up appointment in the next month with your nephrologist.   Any Other Special Instructions Will Be Listed Below (If Applicable).  Dr End has given you scales today.   Your physician recommends that you weigh, daily, at the same time every day, and in the same amount of clothing. Please record your daily weights.  Dr End recommends that you weigh today-this will be your baseline weight. If your weight goes above your weight today, Dr End wants you to take lasix 20 mg two times a day until your weight goes back to what you weighed today. Once you get back to your baseline weight (your weight today) decrease lasix back to 20 mg daily.   Low-Sodium Eating Plan Sodium, which is an element that makes up salt, helps you maintain a healthy balance of fluids in your body. Too much sodium can increase your blood pressure and cause fluid and waste to be held in your body. Your health care provider or dietitian may recommend following this plan if you have high blood pressure (hypertension), kidney disease, liver disease, or heart failure. Eating less sodium can help lower your blood pressure, reduce swelling, and protect your heart, liver, and kidneys. What are tips for following this plan? General guidelines  Most people on this plan should limit their sodium intake to 1,500-2,000 mg (milligrams) of sodium each day. Reading food labels  The Nutrition Facts label lists the amount of sodium in one serving of the food. If you eat more than one serving, you must multiply the listed amount of sodium by the number of  servings.  Choose foods with less than 140 mg of sodium per serving.  Avoid foods with 300 mg of sodium or more per serving. Shopping  Look for lower-sodium products, often labeled as "low-sodium" or "no salt added."  Always check the sodium content even if foods are labeled as "unsalted" or "no salt added".  Buy fresh foods. ? Avoid canned foods and premade or frozen meals. ? Avoid canned, cured, or processed meats  Buy breads that have less than 80 mg of sodium per slice. Cooking  Eat more home-cooked food and less restaurant, buffet, and fast food.  Avoid adding salt when cooking. Use salt-free seasonings or herbs instead of table salt or sea salt. Check with your health care provider or pharmacist before using salt substitutes.  Cook with plant-based oils, such as canola, sunflower, or olive oil. Meal planning  When eating at a restaurant, ask that your food be prepared with less salt or no salt, if possible.  Avoid foods that contain MSG (monosodium glutamate). MSG is sometimes added to [place] food, bouillon, and some canned foods. What foods are recommended? The items listed may not be a complete list. Talk with your dietitian about what dietary choices are best for you. Grains Low-sodium cereals, including oats, puffed wheat and rice, and shredded wheat. Low-sodium crackers. Unsalted rice. Unsalted pasta. Low-sodium bread. Whole-grain breads and whole-grain pasta. Vegetables Fresh or frozen vegetables. "No salt added" canned vegetables. "No salt added" tomato sauce and paste. Low-sodium or reduced-sodium tomato and vegetable juice. Fruits Fresh, frozen, or canned fruit. Fruit  juice. Meats and other protein foods Fresh or frozen (no salt added) meat, poultry, seafood, and fish. Low-sodium canned tuna and salmon. Unsalted nuts. Dried peas, beans, and lentils without added salt. Unsalted canned beans. Eggs. Unsalted nut butters. Dairy Milk. Soy milk. Cheese that is  naturally low in sodium, such as ricotta cheese, fresh mozzarella, or Swiss cheese Low-sodium or reduced-sodium cheese. Cream cheese. Yogurt. Fats and oils Unsalted butter. Unsalted margarine with no trans fat. Vegetable oils such as canola or olive oils. Seasonings and other foods Fresh and dried herbs and spices. Salt-free seasonings. Low-sodium mustard and ketchup. Sodium-free salad dressing. Sodium-free light mayonnaise. Fresh or refrigerated horseradish. Lemon juice. Vinegar. Homemade, reduced-sodium, or low-sodium soups. Unsalted popcorn and pretzels. Low-salt or salt-free chips. What foods are not recommended? The items listed may not be a complete list. Talk with your dietitian about what dietary choices are best for you. Grains Instant hot cereals. Bread stuffing, pancake, and biscuit mixes. Croutons. Seasoned rice or pasta mixes. Noodle soup cups. Boxed or frozen macaroni and cheese. Regular salted crackers. Self-rising flour. Vegetables Sauerkraut, pickled vegetables, and relishes. Olives. [place] fries. Onion rings. Regular canned vegetables (not low-sodium or reduced-sodium). Regular canned tomato sauce and paste (not low-sodium or reduced-sodium). Regular tomato and vegetable juice (not low-sodium or reduced-sodium). Frozen vegetables in sauces. Meats and other protein foods Meat or fish that is salted, canned, smoked, spiced, or pickled. Bacon, ham, sausage, hotdogs, corned beef, chipped beef, packaged lunch meats, salt pork, jerky, pickled herring, anchovies, regular canned tuna, sardines, salted nuts. Dairy Processed cheese and cheese spreads. Cheese curds. Blue cheese. Feta cheese. String cheese. Regular cottage cheese. Buttermilk. Canned milk. Fats and oils Salted butter. Regular margarine. Ghee. Bacon fat. Seasonings and other foods Onion salt, garlic salt, seasoned salt, table salt, and sea salt. Canned and packaged gravies. Worcestershire sauce. Tartar sauce. Barbecue sauce.  Teriyaki sauce. Soy sauce, including reduced-sodium. Steak sauce. Fish sauce. Oyster sauce. Cocktail sauce. Horseradish that you find on the shelf. Regular ketchup and mustard. Meat flavorings and tenderizers. Bouillon cubes. Hot sauce and Tabasco sauce. Premade or packaged marinades. Premade or packaged taco seasonings. Relishes. Regular salad dressings. Salsa. Potato and tortilla chips. Corn chips and puffs. Salted popcorn and pretzels. Canned or dried soups. Pizza. Frozen entrees and pot pies. Summary  Eating less sodium can help lower your blood pressure, reduce swelling, and protect your heart, liver, and kidneys.  Most people on this plan should limit their sodium intake to 1,500-2,000 mg (milligrams) of sodium each day.  Canned, boxed, and frozen foods are high in sodium. Restaurant foods, fast foods, and pizza are also very high in sodium. You also get sodium by adding salt to food.  Try to cook at home, eat more fresh fruits and vegetables, and eat less fast food, canned, processed, or prepared foods. This information is not intended to replace advice given to you by your health care provider. Make sure you discuss any questions you have with your health care provider. Document Released: 02/16/2002 Document Revised: 08/20/2016 Document Reviewed: 08/20/2016 Elsevier Interactive Patient Education  2017 [employer].          If you need a refill on your cardiac medications before your next appointment, please call your pharmacy.

## 2017-02-21 NOTE — Progress Notes (Signed)
Follow-up Outpatient Visit Date: 02/21/2017  Chief Complaint: Hospital follow-up for acute decompensated heart failure  HPI:  [name] is a 56 y.o. 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 I last saw Mr. Tober on 12/20/16, at which time he was doing relatively well. He notes that in May, he began to notice exertional shortness of breath when walking up a hill near his apartment. This progressed to the point of significant dyspnea with minimal activity such as standing up, prompting him to go to the ER on 02/08/17 for evaluation. He had clinical and radiographic evidence of volume overload and was aggressively diuresed with resolution of his symptoms. Since leaving the hospital, he has been taking furosemide 20 mg daily. He was initially placed on 40 mg daily but did not wish to continue with this due to increased urinary frequency on the higher dose. He has trace ankle edema that is unchanged from his baseline. He believes that his weight is stable, though he does not have a scale at home. He denies significant shortness of breath. He continues to have some exertional chest pain, though it is stable to slightly improved from prior visits. Unfortunately, he is again unable to afford ranolazine and is now using acetaminophen as needed for control of his chest pain in addition to his other chronic cardiac medications that include carvedilol and isosorbide mononitrate. He remains on dual antiplatelet therapy without significant bleeding.  Mr. Nazir has long-standing 2 pillow orthopnea. He is compliant with his CPAP machine every time he goes to sleep. He is concerned about the recent diagnosis of heart  failure, particularly since he was hospitalized in March with unstable angina and was not told anything about possible heart failure. He is scheduled to follow-up with his nephrologist, whose name he does not recall, at some point in the near future. He does not know when that appointment is.  --------------------------------------------------------------------------------------------------  Cardiovascular History & Procedures: Cardiovascular Problems:  Coronary artery disease with chronic stable angina  Ischemic cardiomyopathy and chronic  Risk Factors:  Known coronary artery disease, hypertension, hyperlipidemia, diabetes mellitus, and male gender  Cath/PCI:  LHC (12/02/13): LMCA with 30% distal stenosis. LAD with multiple stents and 50% ostial stenosis followed by 20% diffuse in-stent restenosis proximally. There is diffuse 50% in-stent restenosis in the mid and distal segments. The distal LAD is occluded. LCx is small with 40-50% ostial stenosis. OM 2 has a 50% ostial stenosis. Ramus intermedius is a large vessel with 70-80% stenosis involving its superior branch. 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 There is diffuse 20% proximal disease. There is a 90% stenosis involving the distal vessel just beyond the anastomosis with the RIMA. 60% stenosis extending into the RPDA is also evident. RIMA to RCA is widely patent with aforementioned stenosis just beyond the distal anastomosis.  PCI (12/03/13): Successful PCI to the mid/distal RCA with overlapping Promus drug-eluting stents (2.25 x 24 and 2.25 x 16 mm).  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  EP Procedures and Devices:  None  Non-Invasive Evaluation(s):  Transthoracic echocardiogram (12/18/16): Normal LV size with moderate LVH. LVEF mildly reduced at 45-50% with anterior, apical septal, apical,  and apical inferoapical akinesis. Grade 1 diastolic dysfunction.  Trileaflet aortic valve with mild stenosis (mean gradient 14 mmHg). Moderately dilated left ventricle. Mildly dilated right ventricle with mildly reduced contraction. Normal pulmonary artery and central venous pressure.  Pharmacologic myocardial perfusion stress test (11/16/16): High risk study with large fixed inferior defect extending to the apex. Dilated left ventricle with akinesis of the inferior wall. LVEF 35%.  Transthoracic echocardiogram (12/18/16): Normal LV size with moderate LVH. LVEF mildly reduced (45-50%). There is anterior and apical akinesis suggestive of LAD disease. Mild aortic stenosis noted with a mean gradient of 14 mmHg. Left atrium moderately dilated. Right ventricle mildly dilated and mildly reduced contraction.  Pharmacologic myocardial perfusion stress test (11/16/16): Large fixed differential defect involving the inferior wall and apex consistent with scar. LVEF 35% with inferior akinesis.  Transthoracic echocardiogram (01/02/15): Normal LV size with mild LVH. LVEF 50-55% with normal wall motion and grade 1 diastolic dysfunction. Very mild aortic stenosis. Mitral and or calcium patient with mild leaflet thickening and regurgitation. Normal RV size and function.  Pharmacologic myocardial perfusion stress test (11/28/13): Large, partially reversible inferolateral defect. Fixed apical defect. LVEF 54%.  Recent CV Pertinent Labs: Lab Results  Component Value Date   CHOL 161 11/12/2016   HDL 31 (L) 11/12/2016   LDLCALC 86 11/12/2016   LDLDIRECT 72.0 11/03/2014   TRIG 219 (H) 11/12/2016   CHOLHDL 5.2 (H) 11/12/2016   CHOLHDL 6.6 03/25/2016   INR 1.07 11/16/2016   BNP 962.0 (H) 02/08/2017   K 4.0 02/09/2017   MG 1.6 (L) 04/04/2016   BUN 36 (H) 02/09/2017   CREATININE 2.76 (H) 02/09/2017   CREATININE 2.47 (H) 04/19/2016    Past medical and surgical history were reviewed and updated in EPIC.   Outpatient Encounter Prescriptions as of 02/21/2017  Medication Sig  .  Acetaminophen (TYLENOL PO) Take by mouth as directed.  Marland Kitchen amLODipine (NORVASC) 10 MG tablet Take 1 tablet (10 mg total) by mouth daily.  Marland Kitchen aspirin 81 MG tablet Take 1 tablet (81 mg total) by mouth daily.  . carvedilol (COREG) 25 MG tablet Take 2 tablets (50 mg total) by mouth 2 (two) times daily.  . clopidogrel (PLAVIX) 75 MG tablet TAKE ONE TABLET BY MOUTH ONCE DAILY WITH BREAKFAST  . furosemide (LASIX) 20 MG tablet Take 2 tablets (40 mg total) by mouth daily.  Marland Kitchen gabapentin (NEURONTIN) 300 MG capsule Take 300 mg by mouth 3 (three) times daily.  . hydrALAZINE (APRESOLINE) 50 MG tablet Take 1 tablet (50 mg total) by mouth 3 (three) times daily.  . isosorbide mononitrate (IMDUR) 120 MG 24 hr tablet Take 2 tablets (240 mg total) by mouth daily.  . metFORMIN (GLUCOPHAGE) 1000 MG tablet Take 1,000 mg by mouth 2 (two) times daily with a meal.  . Multiple Vitamin (MULTIVITAMIN WITH MINERALS) TABS tablet Take 1 tablet by mouth daily.  . nitroGLYCERIN (NITROLINGUAL) 0.4 MG/SPRAY spray Place 1 spray under the tongue every 5 (five) minutes x 3 doses as needed for chest pain.  . Omega-3 Fatty Acids (FISH OIL) 1200 MG CAPS Take 2,400 mg by mouth 2 (two) times daily.   . pantoprazole (PROTONIX) 40 MG tablet Take 40 mg by mouth daily.   . vitamin E 400 UNIT capsule Take 400 Units by mouth daily.  Marland Kitchen atorvastatin (LIPITOR) 80 MG tablet Take 1 tablet (80 mg total) by mouth daily.  . [DISCONTINUED] diphenhydramine-acetaminophen (TYLENOL PM) 25-500 MG TABS tablet Take 1 tablet by mouth at bedtime.  . [  DISCONTINUED] ranolazine (RANEXA) 500 MG 12 hr tablet Take 1 tablet (500 mg total) by mouth 2 (two) times daily. (Patient not taking: Reported on 02/08/2017)  . [DISCONTINUED] traMADol (ULTRAM) 50 MG tablet Take 50 mg by mouth every 8 (eight) hours as needed for moderate pain.  . [DISCONTINUED] zolpidem (AMBIEN) 10 MG tablet Take 10 mg by mouth at bedtime as needed for sleep.   No facility-administered encounter  medications on file as of 02/21/2017.     Allergies: Ciprocin-fluocin-procin [fluocinolone acetonide]; Clarithromycin; Cleocin [clindamycin hcl]; Glimepiride [amaryl]; and Colchicine  Social History   Social History  . Marital status: Married    Spouse name: [name]  . Number of children: 5  . Years of education: N/A   Occupational History  . retired     Chartered loss adjuster   Social History Main Topics  . Smoking status: Former Smoker    Types: Cigarettes  . Smokeless tobacco: Never Used     Comment: smoked some as a teenager (high school) not even a pack a week  . Alcohol use No     Comment: 09/15/2012 "drank a little when I was young"  . Drug use: No  . Sexual activity: Yes   Other Topics Concern  . Not on file   Social History Narrative   Married and lives with wife in [place]. On disability.    Consumes about 2 Coke Zeros a day     Family History  Problem Relation Age of Onset  . Lung cancer Mother   . Anemia Mother   . Polycystic kidney disease Mother   . Diabetes Father   . Heart attack Father 61       Died suddenly  . Heart failure Father   . Hyperlipidemia Father   . Hypertension Father   . Kidney failure Sister 5       Died  . Heart attack Sister   . Hypertension Sister   . Diabetes Sister   . Diabetes Brother   . Polycystic kidney disease Brother   . Diabetes Sister     Review of Systems: A 12-system review of systems was performed and was negative except as noted in the HPI.  --------------------------------------------------------------------------------------------------  Physical Exam: BP 128/72   Pulse 78   Ht 5\' 5"  (1.651 m)   Wt 231 lb 3.2 oz (104.9 kg)   BMI 38.47 kg/m   General:  Obese man, seated comfortably on the exam table. He is accompanied by his wife. HEENT: No conjunctival pallor or scleral icterus.  Moist mucous membranes.  OP clear. Neck: Supple without lymphadenopathy, thyromegaly, JVD, or HJR. Lungs: Normal work of breathing.   Diffusely diminished breath sounds without wheezes or crackles.. Heart: Regular rate and rhythm without murmurs, rubs, or gallops. Unable to assess PMI due to body habitus. Abd: Bowel sounds present.  Soft, NT/ND. Unable to assess HSM due to body habitus. Ext: Trace pretibial edema bilaterally.  Radial, PT, and DP pulses are 2+ bilaterally. Skin: Warm and dry without rash.  EKG: Sinus rhythm with left axis deviation, borderline LVH, poor R-wave progression, and possible inferior Q waves. Inferior changes are more pronounced than on tracing from C5/31/18 but are unchanged since 12/20/16.  Lab Results  Component Value Date   WBC 6.7 02/09/2017   HGB 9.5 (L) 02/09/2017   HCT 29.4 (L) 02/09/2017   MCV 87.0 02/09/2017   PLT 228 02/09/2017    Lab Results  Component Value Date   NA 136 02/09/2017   K  4.0 02/09/2017   CL 104 02/09/2017   CO2 18 (L) 02/09/2017   BUN 36 (H) 02/09/2017   CREATININE 2.76 (H) 02/09/2017   GLUCOSE 113 (H) 02/09/2017   ALT 29 02/08/2017    Lab Results  Component Value Date   CHOL 161 11/12/2016   HDL 31 (L) 11/12/2016   LDLCALC 86 11/12/2016   LDLDIRECT 72.0 11/03/2014   TRIG 219 (H) 11/12/2016   CHOLHDL 5.2 (H) 11/12/2016    --------------------------------------------------------------------------------------------------  ASSESSMENT AND PLAN: Coronary artery disease with chronic stable angina Chest pain has not changed significantly despite Mr. Lawless not being able to take ranolazine due to affordability issues (multiple attempts to help lower his co-pay has been made, though the patient ultimately is unable to procure supply on his own). Myocardial perfusion stress test in March showed a large inferior fixed defect. We discussed the risks and benefits of performing coronary angiography for better characterization, particularly since his decline in LVEF this year and recent hospitalization for heart failure. Mr. Boomhower is hesitant to proceed given his  CKD and improvement in symptoms with diuresis. We will therefore defer cardiac catheterization unless he has worsening symptoms.  Ischemic cardiomyopathy and chronic diastolic heart failure Mr. Krone was recently hospitalized for acute heart failure exacerbation that responded promptly to IV diuresis. He has trace lower extremity edema on exam today. He has gained 5 pounds since discharge on 02/09/17, albeit with different scales. I encouraged him to limit his salt intake. He is hesitant to increase furosemide at this time, given increased urinary frequency. I counseled him on the importance of volume management to prevent worsening shortness of breath and rehospitalization. We provided him with a scale today, which he should not use to weigh himself on a daily basis. I advised him to increase furosemide to 20 mg twice a day if he gains any weight beyond what he is today. Given his CKD, he will likely need further escalation of diuretic therapy in the future. I counseled him to contact his nephrologist to be be reevaluated within the next month.  Hyperlipidemia We will continue high intensity statin therapy. Given that his LDL increased with switching from pravastatin to atorvastatin, medication compliance was reiterated.  Essential hypertension Blood pressure is well controlled today. He should continue his current medications.  Chronic kidney disease stage III Creatinine had trended up during recent hospitalization and was 2.7 at the time of discharge. I will recheck a basic metabolic panel today to ensure that it is stable to improved. I advised Mr. Craine to schedule an appointment with his nephrologist to be seen within the next month, particularly since we may need to consider coronary angiography in the future.  Follow-up: Return to clinic in one month.  Nelva Bush, MD 02/21/2017 8:56 AM

## 2017-02-22 ENCOUNTER — Other Ambulatory Visit: Payer: Self-pay

## 2017-02-22 DIAGNOSIS — N183 Chronic kidney disease, stage 3 unspecified: Secondary | ICD-10-CM

## 2017-03-29 ENCOUNTER — Encounter: Payer: Self-pay | Admitting: Internal Medicine

## 2017-03-29 ENCOUNTER — Ambulatory Visit (INDEPENDENT_AMBULATORY_CARE_PROVIDER_SITE_OTHER): Payer: Medicare Other | Admitting: Internal Medicine

## 2017-03-29 VITALS — BP 118/68 | HR 86 | Ht 65.0 in | Wt 231.4 lb

## 2017-03-29 DIAGNOSIS — I5032 Chronic diastolic (congestive) heart failure: Secondary | ICD-10-CM | POA: Diagnosis not present

## 2017-03-29 DIAGNOSIS — E785 Hyperlipidemia, unspecified: Secondary | ICD-10-CM

## 2017-03-29 DIAGNOSIS — N183 Chronic kidney disease, stage 3 unspecified: Secondary | ICD-10-CM

## 2017-03-29 DIAGNOSIS — I10 Essential (primary) hypertension: Secondary | ICD-10-CM

## 2017-03-29 DIAGNOSIS — I255 Ischemic cardiomyopathy: Secondary | ICD-10-CM | POA: Diagnosis not present

## 2017-03-29 DIAGNOSIS — I25118 Atherosclerotic heart disease of native coronary artery with other forms of angina pectoris: Secondary | ICD-10-CM | POA: Diagnosis not present

## 2017-03-29 MED ORDER — RANOLAZINE ER 500 MG PO TB12: 500.0000 mg | ORAL_TABLET | Freq: Two times a day (BID) | ORAL | 3 refills | Status: AC

## 2017-03-29 MED ORDER — FUROSEMIDE 40 MG PO TABS: 40.0000 mg | ORAL_TABLET | Freq: Every day | ORAL | 1 refills | Status: DC

## 2017-03-29 NOTE — Progress Notes (Signed)
Follow-up Outpatient Visit Date: 03/29/2017  Chief Complaint: Chest pain and shortness of breath  HPI:  [name] is a 56 y.o. 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 I last saw him on 02/21/17 after a preceding hospitalization in early June for acutely decompensated heart failure in the setting of mildly reduced LV contraction. At that time, [name] was feeling relatively well after the addition of standing furosemide. Today, [name] reports that he is feeling relatively well. He began having pain along the dorsum of his right foot, consistent with a gout flare. He has started drinking cherry juice, which has helped in the past. He has an unable to use other medications including NSAIDs and colchicine due to his chronic kidney disease. Occasionally, he will need prednisone to help with his pain.  From a heart standpoint, [name] reports that he has been doing relatively well. He has not had any significant shortness of breath or edema. His weight has been stable. He has had occasional mild chest pain since our last visit. His most recent episode was this morning but resolved promptly with a single sublingual nitroglycerin. His wife notes that [name] has not been using nitroglycerin as much. He is tolerating high-dose indoor well. He is also scheduled to establish new PCP at the [facility] next month and believes that he will be able to obtain ranolazine, which has been prohibitively expensive for him in the past, through the [place]. He denies palpitations and lightheadedness. He has stable 1 to 2 pillow orthopnea without  PND.  --------------------------------------------------------------------------------------------------  Cardiovascular History & Procedures: Cardiovascular Problems:  Coronary artery disease with chronic stable angina  Ischemic cardiomyopathy and chronic  Risk Factors:  Known coronary artery disease, hypertension, hyperlipidemia, diabetes mellitus, and male gender  Cath/PCI:  LHC (12/02/13): LMCA with 30% distal stenosis. LAD with multiple stents and 50% ostial stenosis followed by 20% diffuse in-stent restenosis proximally. There is diffuse 50% in-stent restenosis in the mid and distal segments. The distal LAD is occluded. LCx is small with 40-50% ostial stenosis. OM 2 has a 50% ostial stenosis. Ramus intermedius is a large vessel with 70-80% stenosis involving its superior branch. 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 There is diffuse 20% proximal disease. There is a 90% stenosis involving the distal vessel just beyond the anastomosis with the RIMA. 60% stenosis extending into the RPDA is also evident. RIMA to RCA is widely patent with aforementioned stenosis just beyond the distal anastomosis.  PCI (12/03/13): Successful PCI to the mid/distal RCA with overlapping Promus drug-eluting stents (2.25 x 24 and 2.25 x 16 mm).  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  EP Procedures and Devices:  None  Non-Invasive Evaluation(s):  Transthoracic echocardiogram (12/18/16): Normal LV size with moderate LVH. LVEF mildly reduced at 45-50% with anterior, apical septal, apical, and apical inferoapical akinesis. Grade 1 diastolic dysfunction. Trileaflet aortic valve with mild stenosis (mean gradient 14 mmHg). Moderately dilated left ventricle. Mildly dilated right ventricle with mildly reduced contraction. Normal pulmonary artery and central venous pressure.  Pharmacologic myocardial perfusion stress test (11/16/16): High risk study  with large fixed inferior defect extending to the apex. Dilated left ventricle with akinesis  of the inferior wall. LVEF 35%.  Transthoracic echocardiogram (12/18/16): Normal LV size with moderate LVH. LVEF mildly reduced (45-50%). There is anterior and apical akinesis suggestive of LAD disease. Mild aortic stenosis noted with a mean gradient of 14 mmHg. Left atrium moderately dilated. Right ventricle mildly dilated and mildly reduced contraction.  Pharmacologic myocardial perfusion stress test (11/16/16): Large fixed differential defect involving the inferior wall and apex consistent with scar. LVEF 35% with inferior akinesis.  Transthoracic echocardiogram (01/02/15): Normal LV size with mild LVH. LVEF 50-55% with normal wall motion and grade 1 diastolic dysfunction. Very mild aortic stenosis. Mitral and or calcium patient with mild leaflet thickening and regurgitation. Normal RV size and function.  Pharmacologic myocardial perfusion stress test (11/28/13): Large, partially reversible inferolateral defect. Fixed apical defect. LVEF 54%.  Recent CV Pertinent Labs: Lab Results  Component Value Date   CHOL 161 11/12/2016   HDL 31 (L) 11/12/2016   LDLCALC 86 11/12/2016   LDLDIRECT 72.0 11/03/2014   TRIG 219 (H) 11/12/2016   CHOLHDL 5.2 (H) 11/12/2016   CHOLHDL 6.6 03/25/2016   INR 1.07 11/16/2016   BNP 962.0 (H) 02/08/2017   K 4.7 02/21/2017   MG 1.6 (L) 04/04/2016   BUN 36 (H) 02/21/2017   CREATININE 2.81 (H) 02/21/2017   CREATININE 2.47 (H) 04/19/2016    Past medical and surgical history were reviewed and updated in EPIC.   Outpatient Encounter Prescriptions as of 03/29/2017  Medication Sig  . Acetaminophen (TYLENOL PO) Take by mouth as directed.  Marland Kitchen amLODipine (NORVASC) 10 MG tablet Take 1 tablet (10 mg total) by mouth daily.  Marland Kitchen aspirin 81 MG tablet Take 1 tablet (81 mg total) by mouth daily.  Marland Kitchen atorvastatin (LIPITOR) 80 MG tablet Take 1 tablet (80 mg total) by mouth daily.  .  carvedilol (COREG) 25 MG tablet Take 2 tablets (50 mg total) by mouth 2 (two) times daily.  . clopidogrel (PLAVIX) 75 MG tablet TAKE ONE TABLET BY MOUTH ONCE DAILY WITH BREAKFAST  . furosemide (LASIX) 20 MG tablet Take 2 tablets (40 mg total) by mouth daily.  Marland Kitchen gabapentin (NEURONTIN) 300 MG capsule Take 300 mg by mouth 3 (three) times daily.  . hydrALAZINE (APRESOLINE) 50 MG tablet Take 1 tablet (50 mg total) by mouth 3 (three) times daily.  . isosorbide mononitrate (IMDUR) 120 MG 24 hr tablet Take 2 tablets (240 mg total) by mouth daily.  . metFORMIN (GLUCOPHAGE) 1000 MG tablet Take 1,000 mg by mouth 2 (two) times daily with a meal.  . Multiple Vitamin (MULTIVITAMIN WITH MINERALS) TABS tablet Take 1 tablet by mouth daily.  . nitroGLYCERIN (NITROLINGUAL) 0.4 MG/SPRAY spray Place 1 spray under the tongue every 5 (five) minutes x 3 doses as needed for chest pain.  . Omega-3 Fatty Acids (FISH OIL) 1200 MG CAPS Take 2,400 mg by mouth 2 (two) times daily.   . pantoprazole (PROTONIX) 40 MG tablet Take 40 mg by mouth daily.   . vitamin E 400 UNIT capsule Take 400 Units by mouth daily.   No facility-administered encounter medications on file as of 03/29/2017.     Allergies: Ciprocin-fluocin-procin [fluocinolone acetonide]; Clarithromycin; Cleocin [clindamycin hcl]; Glimepiride [amaryl]; and Colchicine  Social History   Social History  . Marital status: Married    Spouse name: [name]  . Number of children: 5  . Years of education: N/A   Occupational History  . retired     Chartered loss adjuster   Social History Main Topics  . Smoking status: Former  Smoker    Types: Cigarettes  . Smokeless tobacco: Never Used     Comment: smoked some as a teenager (high school) not even a pack a week  . Alcohol use No     Comment: 09/15/2012 "drank a little when I was young"  . Drug use: No  . Sexual activity: Yes   Other Topics Concern  . Not on file   Social History Narrative   Married and lives with wife  in [place]. On disability.    Consumes about 2 Coke Zeros a day     Family History  Problem Relation Age of Onset  . Lung cancer Mother   . Anemia Mother   . Polycystic kidney disease Mother   . Diabetes Father   . Heart attack Father 53       Died suddenly  . Heart failure Father   . Hyperlipidemia Father   . Hypertension Father   . Kidney failure Sister 5       Died  . Heart attack Sister   . Hypertension Sister   . Diabetes Sister   . Diabetes Brother   . Polycystic kidney disease Brother   . Diabetes Sister     Review of Systems: A 12-system ROS was performed and was negative, except as noted in the history of present illness.  --------------------------------------------------------------------------------------------------  Physical Exam: BP 118/68   Pulse 86   Ht 5\' 5"  (1.651 m)   Wt 231 lb 6.4 oz (105 kg)   SpO2 98%   BMI 38.51 kg/m   General:  Obese man, seated comfortably in the exam room. HEENT: No conjunctival pallor or scleral icterus.  Moist mucous membranes.  OP clear. Neck: Supple without lymphadenopathy, thyromegaly, JVD, or HJR. Lungs: Normal work of breathing.  Clear to auscultation bilaterally without wheezes or crackles. Heart: Regular rate and rhythm without murmurs, rubs, or gallops.  Non-displaced PMI. Abd: Bowel sounds present.  Soft, NT/ND without hepatosplenomegaly Ext: Trace pretibial edema.  Radial, PT, and DP pulses are 2+ bilaterally. Skin: Warm and dry without rash.  Lab Results  Component Value Date   WBC 6.7 02/09/2017   HGB 9.5 (L) 02/09/2017   HCT 29.4 (L) 02/09/2017   MCV 87.0 02/09/2017   PLT 228 02/09/2017    Lab Results  Component Value Date   NA 138 02/21/2017   K 4.7 02/21/2017   CL 104 02/21/2017   CO2 16 (L) 02/21/2017   BUN 36 (H) 02/21/2017   CREATININE 2.81 (H) 02/21/2017   GLUCOSE 154 (H) 02/21/2017   ALT 29 02/08/2017    Lab Results  Component Value Date   CHOL 161 11/12/2016   HDL 31 (L) 11/12/2016    LDLCALC 86 11/12/2016   LDLDIRECT 72.0 11/03/2014   TRIG 219 (H) 11/12/2016   CHOLHDL 5.2 (H) 11/12/2016    --------------------------------------------------------------------------------------------------  ASSESSMENT AND PLAN: Coronary artery disease with chronic stable angina Episodic chest pain is stable to improved compared with prior visit. [name] does not seem to be using his sublingual nitroglycerin as often. He is tolerating aggressive anti-anginal therapy well. In the past, he has been unable to afford his co-pay to obtain ranolazine. Hopefully, this will be less of a problem when he becomes established at the [facility] [place]. We will provide him with samples of ranolazine 500 mg twice a day to carry him through [facility]. We will continue with indefinite dual antiplatelet therapy. Given stable symptoms, we have agreed to defer cardiac  catheterization in the setting of chronic kidney disease.  Ischemic cardiomyopathy and chronic diastolic heart failure [name] appears euvolemic with stable weight and NYHA class II heart failure. We will continue his current medication regimen, including furosemide 40 mg daily to maintain his volume status. I encouraged him to continue with salt restriction and to weigh himself daily. He should take an extra dose of furosemide if he gains more than 2 pounds in a day or 5 pounds in a week. I will check a basic metabolic panel today to ensure stable renal function and potassium in the setting of ongoing diuretic therapy.  Hyperlipidemia [name] is tolerating high intensity statin therapy well. No medication changes today.  Essential hypertension Blood pressure is normal today. [name] should continue his current regimen.  Chronic kidney disease stage III Renal function has been stable. However, in the setting of ongoing diuresis, I will recheck a basic metabolic panel today. [name] should follow-up with his  nephrologist as previously discussed.  Follow-up: Return to clinic in 2 months.  Nelva Bush, MD 03/29/2017 1:24 PM

## 2017-03-29 NOTE — Patient Instructions (Signed)
Medication Instructions:  Your physician recommends that you continue on your current medications as directed. Please refer to the Current Medication list given to you today.   Labwork: BMET today  Testing/Procedures: None   Follow-Up: Your physician recommends that you schedule a follow-up appointment in: 2 months with Dr End.         If you need a refill on your cardiac medications before your next appointment, please call your pharmacy.

## 2017-03-30 ENCOUNTER — Encounter: Payer: Self-pay | Admitting: Internal Medicine

## 2017-03-30 ENCOUNTER — Other Ambulatory Visit: Payer: Self-pay | Admitting: Cardiology

## 2017-03-30 LAB — BASIC METABOLIC PANEL
BUN/Creatinine Ratio: 14 (ref 9–20)
BUN: 35 mg/dL — ABNORMAL HIGH (ref 6–24)
CALCIUM: 9.2 mg/dL (ref 8.7–10.2)
CHLORIDE: 106 mmol/L (ref 96–106)
CO2: 17 mmol/L — AB (ref 20–29)
Creatinine, Ser: 2.52 mg/dL — ABNORMAL HIGH (ref 0.76–1.27)
GFR calc Af Amer: 31 mL/min/{1.73_m2} — ABNORMAL LOW (ref >59)
GFR calc non Af Amer: 27 mL/min/{1.73_m2} — ABNORMAL LOW (ref >59)
GLUCOSE: 133 mg/dL — AB (ref 65–99)
POTASSIUM: 4.9 mmol/L (ref 3.5–5.2)
Sodium: 140 mmol/L (ref 134–144)

## 2017-03-31 ENCOUNTER — Inpatient Hospital Stay (HOSPITAL_COMMUNITY)
Admission: EM | Admit: 2017-03-31 | Discharge: 2017-04-03 | DRG: 281 | Disposition: A | Discharge status: Home / Self Care | Payer: Medicare Other | Attending: Internal Medicine | Admitting: Internal Medicine

## 2017-03-31 ENCOUNTER — Emergency Department (HOSPITAL_COMMUNITY): Payer: Medicare Other

## 2017-03-31 ENCOUNTER — Encounter (HOSPITAL_COMMUNITY): Payer: Self-pay

## 2017-03-31 DIAGNOSIS — Z7902 Long term (current) use of antithrombotics/antiplatelets: Secondary | ICD-10-CM

## 2017-03-31 DIAGNOSIS — Z9989 Dependence on other enabling machines and devices: Secondary | ICD-10-CM | POA: Diagnosis not present

## 2017-03-31 DIAGNOSIS — E1122 Type 2 diabetes mellitus with diabetic chronic kidney disease: Secondary | ICD-10-CM | POA: Diagnosis not present

## 2017-03-31 DIAGNOSIS — E1121 Type 2 diabetes mellitus with diabetic nephropathy: Secondary | ICD-10-CM | POA: Diagnosis present

## 2017-03-31 DIAGNOSIS — Z951 Presence of aortocoronary bypass graft: Secondary | ICD-10-CM

## 2017-03-31 DIAGNOSIS — Q613 Polycystic kidney, unspecified: Secondary | ICD-10-CM

## 2017-03-31 DIAGNOSIS — G4733 Obstructive sleep apnea (adult) (pediatric): Secondary | ICD-10-CM | POA: Diagnosis not present

## 2017-03-31 DIAGNOSIS — E875 Hyperkalemia: Secondary | ICD-10-CM | POA: Diagnosis present

## 2017-03-31 DIAGNOSIS — I5042 Chronic combined systolic (congestive) and diastolic (congestive) heart failure: Secondary | ICD-10-CM | POA: Diagnosis present

## 2017-03-31 DIAGNOSIS — K219 Gastro-esophageal reflux disease without esophagitis: Secondary | ICD-10-CM | POA: Diagnosis present

## 2017-03-31 DIAGNOSIS — I130 Hypertensive heart and chronic kidney disease with heart failure and stage 1 through stage 4 chronic kidney disease, or unspecified chronic kidney disease: Secondary | ICD-10-CM | POA: Diagnosis present

## 2017-03-31 DIAGNOSIS — I10 Essential (primary) hypertension: Secondary | ICD-10-CM | POA: Diagnosis present

## 2017-03-31 DIAGNOSIS — E785 Hyperlipidemia, unspecified: Secondary | ICD-10-CM | POA: Diagnosis present

## 2017-03-31 DIAGNOSIS — Z888 Allergy status to other drugs, medicaments and biological substances status: Secondary | ICD-10-CM

## 2017-03-31 DIAGNOSIS — Z87891 Personal history of nicotine dependence: Secondary | ICD-10-CM

## 2017-03-31 DIAGNOSIS — Z7982 Long term (current) use of aspirin: Secondary | ICD-10-CM

## 2017-03-31 DIAGNOSIS — R079 Chest pain, unspecified: Secondary | ICD-10-CM | POA: Diagnosis present

## 2017-03-31 DIAGNOSIS — R072 Precordial pain: Secondary | ICD-10-CM

## 2017-03-31 DIAGNOSIS — N183 Chronic kidney disease, stage 3 unspecified: Secondary | ICD-10-CM

## 2017-03-31 DIAGNOSIS — Z79899 Other long term (current) drug therapy: Secondary | ICD-10-CM

## 2017-03-31 DIAGNOSIS — I214 Non-ST elevation (NSTEMI) myocardial infarction: Secondary | ICD-10-CM | POA: Diagnosis not present

## 2017-03-31 DIAGNOSIS — Z955 Presence of coronary angioplasty implant and graft: Secondary | ICD-10-CM

## 2017-03-31 DIAGNOSIS — I25110 Atherosclerotic heart disease of native coronary artery with unstable angina pectoris: Secondary | ICD-10-CM | POA: Diagnosis present

## 2017-03-31 DIAGNOSIS — Z833 Family history of diabetes mellitus: Secondary | ICD-10-CM

## 2017-03-31 DIAGNOSIS — Z9861 Coronary angioplasty status: Secondary | ICD-10-CM

## 2017-03-31 DIAGNOSIS — I152 Hypertension secondary to endocrine disorders: Secondary | ICD-10-CM | POA: Diagnosis present

## 2017-03-31 DIAGNOSIS — E669 Obesity, unspecified: Secondary | ICD-10-CM | POA: Diagnosis present

## 2017-03-31 DIAGNOSIS — I25118 Atherosclerotic heart disease of native coronary artery with other forms of angina pectoris: Secondary | ICD-10-CM | POA: Diagnosis not present

## 2017-03-31 DIAGNOSIS — Z6837 Body mass index (BMI) 37.0-37.9, adult: Secondary | ICD-10-CM

## 2017-03-31 DIAGNOSIS — Z7984 Long term (current) use of oral hypoglycemic drugs: Secondary | ICD-10-CM

## 2017-03-31 DIAGNOSIS — I252 Old myocardial infarction: Secondary | ICD-10-CM

## 2017-03-31 DIAGNOSIS — M109 Gout, unspecified: Secondary | ICD-10-CM | POA: Diagnosis present

## 2017-03-31 DIAGNOSIS — Z6841 Body Mass Index (BMI) 40.0 and over, adult: Secondary | ICD-10-CM | POA: Diagnosis not present

## 2017-03-31 DIAGNOSIS — I2510 Atherosclerotic heart disease of native coronary artery without angina pectoris: Secondary | ICD-10-CM | POA: Diagnosis present

## 2017-03-31 DIAGNOSIS — T380X5A Adverse effect of glucocorticoids and synthetic analogues, initial encounter: Secondary | ICD-10-CM | POA: Diagnosis present

## 2017-03-31 DIAGNOSIS — N179 Acute kidney failure, unspecified: Secondary | ICD-10-CM | POA: Diagnosis present

## 2017-03-31 DIAGNOSIS — E1165 Type 2 diabetes mellitus with hyperglycemia: Secondary | ICD-10-CM | POA: Diagnosis present

## 2017-03-31 DIAGNOSIS — Z8249 Family history of ischemic heart disease and other diseases of the circulatory system: Secondary | ICD-10-CM

## 2017-03-31 DIAGNOSIS — D631 Anemia in chronic kidney disease: Secondary | ICD-10-CM | POA: Diagnosis present

## 2017-03-31 LAB — URINALYSIS, ROUTINE W REFLEX MICROSCOPIC
BACTERIA UA: NONE SEEN
Bilirubin Urine: NEGATIVE
Glucose, UA: NEGATIVE mg/dL
HGB URINE DIPSTICK: NEGATIVE
Ketones, ur: NEGATIVE mg/dL
Leukocytes, UA: NEGATIVE
NITRITE: NEGATIVE
Protein, ur: 100 mg/dL — AB
RBC / HPF: NONE SEEN RBC/hpf (ref 0–5)
SPECIFIC GRAVITY, URINE: 1.015 (ref 1.005–1.030)
Squamous Epithelial / LPF: NONE SEEN
WBC, UA: NONE SEEN WBC/hpf (ref 0–5)
pH: 5 (ref 5.0–8.0)

## 2017-03-31 LAB — CBC
HCT: 32.3 % — ABNORMAL LOW (ref 39.0–52.0)
Hemoglobin: 10.3 g/dL — ABNORMAL LOW (ref 13.0–17.0)
MCH: 27.6 pg (ref 26.0–34.0)
MCHC: 31.9 g/dL (ref 30.0–36.0)
MCV: 86.6 fL (ref 78.0–100.0)
PLATELETS: 249 10*3/uL (ref 150–400)
RBC: 3.73 MIL/uL — ABNORMAL LOW (ref 4.22–5.81)
RDW: 13.6 % (ref 11.5–15.5)
WBC: 13.5 10*3/uL — ABNORMAL HIGH (ref 4.0–10.5)

## 2017-03-31 LAB — HEPATIC FUNCTION PANEL
ALT: 19 U/L (ref 17–63)
AST: 30 U/L (ref 15–41)
Albumin: 3.9 g/dL (ref 3.5–5.0)
Alkaline Phosphatase: 61 U/L (ref 38–126)
Total Bilirubin: 0.5 mg/dL (ref 0.3–1.2)
Total Protein: 7.1 g/dL (ref 6.5–8.1)

## 2017-03-31 LAB — LIPASE, BLOOD: Lipase: 40 U/L (ref 11–51)

## 2017-03-31 LAB — I-STAT TROPONIN, ED: Troponin i, poc: 0.03 ng/mL (ref 0.00–0.08)

## 2017-03-31 MED ORDER — MORPHINE SULFATE (PF) 4 MG/ML IV SOLN
4.0000 mg | Freq: Once | INTRAVENOUS | Status: AC
  Administered 2017-03-31: 4 mg via INTRAVENOUS
  Filled 2017-03-31: qty 1

## 2017-03-31 MED ORDER — GI COCKTAIL ~~LOC~~
30.0000 mL | Freq: Once | ORAL | Status: AC
  Administered 2017-03-31: 30 mL via ORAL
  Filled 2017-03-31: qty 30

## 2017-03-31 NOTE — H&P (Signed)
History and Physical    Matthew Rowe KCL:[id number] DOB: October 17, 1960 DOA: 03/31/2017  Referring MD/NP/PA: Dr. Lita Mains PCP: Patient, No Pcp Per  Patient coming from: [facility]  Chief Complaint: Chest pain   HPI: Matthew Montz. is a 56 y.o. male with medical history significant of CAD s/p PCI, CHF, CKD stage III, DM type 2, gout; who presents with complaints of chest pain. Patient reports complaints of chest pain off and on over the last several days. However yesterday morning symptoms started and progressively worsened. He reports having left-sided chest pain that he describes as sharp and pressure-like that radiated to the right side of his chest, back, neck, and stomach. Pain worse with deep inspiration. Patient had just been seen by his cardiologist Dr. Harrell Gave End, 2 days ago. It appears he was restarted back on Ranexa at that time, which have been stopped due to insurance not covering it. The following day he was seen in his primary care office for acute pain and swelling of his right forefoot foot. Prior to being seen patient had been drinking lots of cherry juice as he had heard that this helps relieve symptoms. He was placed on a prednisone taper and reports that symptoms have resolved. Today when symptoms started patient tried taking nitroglycerin without relief of symptoms. He reports taking the Ranexa and prednisone recently prescribed. Throughout the day it appears the patient was taking additional medication than what is prescribed. Patient apparently took up to 3500 mg of metformin to self treat for elevated blood sugars and additional 500 mg of Ranexa due to chest pain symptoms. Denies having any change in weight, diaphoresis, nausea, vomiting, loss of consciousness, or lightheadedness feelings.  ED Course: Prior to arrival patient had taken an additional 325 mg of aspirin as advised by EMS. Upon admission into the emergency department. She was seen to be afebrile, pulse  107-109, respirations 20, blood pressure 145/85, and O2 saturations maintained on room air. Patient was given a total 8 mg of morphine along with a GI cocktail while in the emergency department with improvement in chest pain symptoms.  Review of Systems: Review of Systems  Constitutional: Negative for chills, fever and weight loss.  HENT: Negative for ear discharge and nosebleeds.   Eyes: Negative for photophobia and pain.  Cardiovascular: Positive for chest pain and leg swelling. Negative for PND.  Gastrointestinal: Negative for constipation.  Genitourinary: Negative for frequency and urgency.  Musculoskeletal: Positive for joint pain.  Neurological: Negative for sensory change and speech change.    Past Medical History:  Diagnosis Date  . Anginal pain ([place])   . CKD (chronic kidney disease), stage III   . Coronary artery disease    a. INF MI 1999: PCI of RCA;  b.  extensive stenting of LAD;  c. s/p CABG with RIMA->RCA in 2006;  d. Stable, Low risk MV 05/2012; e. 09/2012 Cath: stable anatomy->med Rx. f. Abnl nuc 11/2013 -> med rx; g. 11/2013 Cath/PCI: LM 30d, LAD 50ost, 20/50isr, 100d, LCX 40-50ost, OM2 50ost, RI 70-80ost sup branch, RCA 20p, 90/60d s/p DES, PDA 60p, RIMA->RCA ok.  d. 11/2016: nuc with EF 35%, no reversible ischem  . Diastolic heart failure ([place])   . DM2 (diabetes mellitus, type 2) ([place])   . Dyslipidemia   . GERD (gastroesophageal reflux disease)   . History of gout   . Hx of echocardiogram    a. Echo 3/12: mild LVH, EF 55-60%, trivial AI  . Hypertensive heart disease   .  Ischemic cardiomyopathy   . Myocardial infarction [facility]) 1999; 2002; 2003; 2006; ~ 2008  . OSA on CPAP   . Polycystic kidney disease     Past Surgical History:  Procedure Laterality Date  . CARDIAC CATHETERIZATION  2012  . CARDIAC CATHETERIZATION  2013  . CARDIAC CATHETERIZATION  2014   LHC (1/14): total occlusion of distal LAD, diffuse disease in ramus, 70% proximal LCx, 50% proximal and mid RCA,  50% distal RCA, patent RIMA-RCA.  Marland Kitchen CORONARY ANGIOPLASTY WITH STENT PLACEMENT  1999 / 2002 / 2004 / 2006?  . CORONARY ARTERY BYPASS GRAFT  2006   CABG X?; in [place]  . CYSTECTOMY  1990's   off face  . LEFT HEART CATHETERIZATION WITH CORONARY/GRAFT ANGIOGRAM N/A 09/16/2012   Procedure: LEFT HEART CATHETERIZATION WITH [name];  Surgeon: [name], MD;  Location: [facility] CATH LAB;  Service: Cardiovascular;  Laterality: N/A;  . LEFT HEART CATHETERIZATION WITH CORONARY/GRAFT ANGIOGRAM N/A 12/02/2013   Procedure: LEFT HEART CATHETERIZATION WITH [name];  Surgeon: [name], MD;  Location: [facility] CATH LAB;  Service: Cardiovascular;  Laterality: N/A;  . PERCUTANEOUS CORONARY STENT INTERVENTION (PCI-S) N/A 12/03/2013   Procedure: PERCUTANEOUS CORONARY STENT INTERVENTION (PCI-S);  Surgeon: [name], MD;  Location: [facility] CATH LAB;  Service: Cardiovascular;  Laterality: N/A;     reports that he has quit smoking. His smoking use included Cigarettes. He has never used smokeless tobacco. He reports that he does not drink alcohol or use drugs.  Allergies  Allergen Reactions  . Ciprocin-Fluocin-Procin [Fluocinolone Acetonide] Rash  . Clarithromycin Itching and Other (See Comments)    "Biaxin" Eyes itch and burn  . Cleocin [Clindamycin Hcl] Anaphylaxis and Swelling  . Glimepiride [Amaryl] Other (See Comments)    Elevates liver function   . Colchicine Other (See Comments)    Affected kidneys    Family History  Problem Relation Age of Onset  . Lung cancer Mother   . Anemia Mother   . Polycystic kidney disease Mother   . Diabetes Father   . Heart attack Father 42       Died suddenly  . Heart failure Father   . Hyperlipidemia Father   . Hypertension Father   . Kidney failure Sister 5       Died  . Heart attack Sister   . Hypertension Sister   . Diabetes Sister   . Diabetes Brother   . Polycystic kidney disease Brother   . Diabetes Sister      Prior to Admission medications   Medication Sig Start Date End Date Taking? Authorizing Provider  acetaminophen (TYLENOL) 500 MG tablet Take 500-1,000 mg by mouth every 6 (six) hours as needed (for pain or headaches).   Yes [provider]  amLODipine (NORVASC) 10 MG tablet Take 1 tablet (10 mg total) by mouth daily. 01/12/15  Yes Copland, Gay Filler, MD  aspirin 81 MG tablet Take 1 tablet (81 mg total) by mouth daily. Patient taking differently: Take 162 mg by mouth 2 (two) times daily.  12/04/13  Yes Rogelia Mire, NP  atorvastatin (LIPITOR) 80 MG tablet Take 1 tablet (80 mg total) by mouth daily. 11/16/16 03/31/17 Yes Eileen Stanford, PA-C  carvedilol (COREG) 25 MG tablet Take 2 tablets (50 mg total) by mouth 2 (two) times daily. 02/19/17  Yes End, Harrell Gave, MD  clopidogrel (PLAVIX) 75 MG tablet TAKE ONE TABLET BY MOUTH ONCE DAILY WITH BREAKFAST Patient taking differently: Take 75 mg by mouth once a day  with breakfast 12/25/16  Yes End, Harrell Gave, MD  diphenhydrAMINE (BENADRYL) 25 MG tablet Take 25 mg by mouth every 6 (six) hours as needed for allergies.   Yes [provider]  furosemide (LASIX) 40 MG tablet Take 1 tablet (40 mg total) by mouth daily. 03/29/17 06/27/17 Yes End, Harrell Gave, MD  gabapentin (NEURONTIN) 300 MG capsule Take 300 mg by mouth 3 (three) times daily.   Yes [provider]  hydrALAZINE (APRESOLINE) 50 MG tablet Take 1 tablet (50 mg total) by mouth 3 (three) times daily. 12/20/16  Yes End, Harrell Gave, MD  isosorbide mononitrate (IMDUR) 120 MG 24 hr tablet Take 2 tablets (240 mg total) by mouth daily. Patient taking differently: Take 120 mg by mouth 2 (two) times daily.  08/11/16  Yes Kilroy, Luke K, PA-C  metFORMIN (GLUCOPHAGE) 1000 MG tablet Take 1,000 mg by mouth 2 (two) times daily with a meal.   Yes [provider]  Multiple Vitamin (MULTIVITAMIN WITH MINERALS) TABS tablet Take 1 tablet by mouth daily.   Yes [provider]  nitroGLYCERIN (NITROLINGUAL) 0.4 MG/SPRAY spray Place 1 spray under the tongue every 5 (five) minutes x 3 doses as needed for chest pain. 12/25/16  Yes End, Harrell Gave, MD  Omega-3 Fatty Acids (FISH OIL) 1000 MG CAPS Take 2,000 mg by mouth 2 (two) times daily.   Yes [provider]  pantoprazole (PROTONIX) 40 MG tablet Take 40 mg by mouth daily.    Yes [provider]  predniSONE (DELTASONE) 20 MG tablet Take 10-40 mg by mouth See admin instructions. 40 mg once a day for 3 days then 20 mg once a day for 3 days then 10 mg once a day for 2 days then d/c   Yes [provider]  ranolazine (RANEXA) 500 MG 12 hr tablet Take 1 tablet (500 mg total) by mouth 2 (two) times daily. 03/29/17  Yes End, Harrell Gave, MD  vitamin E 400 UNIT capsule Take 400 Units by mouth daily.   Yes [provider]    Physical Exam:  Constitutional: Obese male in mild discomfort Vitals:   03/31/17 2130 03/31/17 2145 03/31/17 2200 03/31/17 2215  BP: (!) 166/80 (!) 148/79 (!) 135/102 (!) 145/85  Pulse:      Resp:      Temp:      TempSrc:      SpO2:      Weight:      Height:       Eyes: PERRL, lids and conjunctivae normal ENMT: Mucous membranes are moist. Posterior pharynx clear of any exudate or lesions.Normal dentition.  Neck: normal, supple, no masses, no thyromegaly Respiratory: clear to auscultation bilaterally, no wheezing, no crackles. Normal respiratory effort. No accessory muscle use.  Cardiovascular: Regular rate and rhythm, no murmurs / rubs / gallops. Trace lower extremity edema. 2+ pedal pulses. No carotid bruits.   Abdomen: no tenderness, no masses palpated. No hepatosplenomegaly. Bowel sounds positive.  Musculoskeletal: no clubbing / cyanosis. No joint deformity upper and lower extremities. Good ROM, no contractures. Normal muscle tone.  Skin: no rashes, lesions, ulcers. No induration Neurologic: CN 2-12 grossly intact. Sensation intact, DTR normal.  Strength 5/5 in all 4.  Psychiatric: Normal judgment and insight. Alert and oriented x 3. Normal mood.     Labs on Admission: I have personally reviewed following labs and imaging studies  CBC:  Recent Labs Lab 03/31/17 2006  WBC 13.5*  HGB 10.3*  HCT 32.3*  MCV 86.6  PLT 249  Basic Metabolic Panel:  Recent Labs Lab 03/29/17 1352  NA 140  K 4.9  CL 106  CO2 17*  GLUCOSE 133*  BUN 35*  CREATININE 2.52*  CALCIUM 9.2   GFR: Estimated Creatinine Clearance: 36.6 mL/min (A) (by C-G formula based on SCr of 2.52 mg/dL (H)). Liver Function Tests:  Recent Labs Lab 03/31/17 2006  AST 30  ALT 19  ALKPHOS 61  BILITOT 0.5  PROT 7.1  ALBUMIN 3.9    Recent Labs Lab 03/31/17 2006  LIPASE 40   No results for input(s): AMMONIA in the last 168 hours. Coagulation Profile: No results for input(s): INR, PROTIME in the last 168 hours. Cardiac Enzymes: No results for input(s): CKTOTAL, CKMB, CKMBINDEX, TROPONINI in the last 168 hours. BNP (last 3 results) No results for input(s): PROBNP in the last 8760 hours. HbA1C: No results for input(s): HGBA1C in the last 72 hours. CBG: No results for input(s): GLUCAP in the last 168 hours. Lipid Profile: No results for input(s): CHOL, HDL, LDLCALC, TRIG, CHOLHDL, LDLDIRECT in the last 72 hours. Thyroid Function Tests: No results for input(s): TSH, T4TOTAL, FREET4, T3FREE, THYROIDAB in the last 72 hours. Anemia Panel: No results for input(s): VITAMINB12, FOLATE, FERRITIN, TIBC, IRON, RETICCTPCT in the last 72 hours. Urine analysis:    Component Value Date/Time   COLORURINE YELLOW 03/31/2017 2014   APPEARANCEUR CLEAR 03/31/2017 2014   LABSPEC 1.015 03/31/2017 2014   PHURINE 5.0 03/31/2017 2014   [place] NEGATIVE 03/31/2017 2014   HGBUR NEGATIVE 03/31/2017 2014   [place] NEGATIVE 03/31/2017 2014   [place] 03/31/2017 2014   PROTEINUR 100 (A) 03/31/2017 2014   UROBILINOGEN 1.0 07/17/2015 1425   NITRITE  NEGATIVE 03/31/2017 2014   LEUKOCYTESUR NEGATIVE 03/31/2017 2014   Sepsis Labs: No results found for this or any previous visit (from the past 240 hour(s)).   Radiological Exams on Admission: Dg Chest Port 1 View  Result Date: 03/31/2017 CLINICAL DATA:  Chest pain for several days EXAM: PORTABLE CHEST 1 VIEW COMPARISON:  02/07/2017 FINDINGS: Post sternotomy changes with fracture through fourth sternal wire as before. Mild cardiomegaly with mild central congestion. No focal consolidation or effusion. No pneumothorax. IMPRESSION: Mild cardiomegaly with central congestion.  No edema or infiltrate. Electronically Signed   By: Donavan Foil M.D.   On: 03/31/2017 20:34    EKG: Independently reviewed. Sinus tachycardia with LBBB  Assessment/Plan Chest pain with elevated troponin and H/O unstable angina: Acute. Patient reports having acute onset of chest pain that was not necessarily relieved with nitroglycerin. Initial troponin was negative and EKG showing nonischemic changes. Patient is followed by Dr. Harrell Gave End. However, repeat troponin seen to be elevated at 0.65. - Admit to stepdown bed - trend cardiac troponins - Nitroglycerin prn chest pain - GI cocktail prn GI upset - Heparin per pharmacy - ASA - Appreciate Dr. Harrell Gave of Cardiology consultative services, will follow-up for further recommendations.   Chronic systolic and diastolic CHF: Last echocardiogram showed EF of 45-50% with grade 1 diastolic dysfunction in 10/6331. Patient appears to be fluid overloaded at this time. - Continue Hydralazine, isosorbide mononitrate, Lasix - Held ranolanzine  Coronary artery disease s/p PCI and CABG - Continue Plavix   Essential hypertension - Continue medications as seen above - Holding metoprolol for possible need of stress testing  Steroid-induced hyperglycemia in diabetes mellitus type 2: Patient only on metformin at home. Reports having blood sugars up as high as 360 due to  recently starting steroids for gout flare. Last  hemoglobin A1c on file was 6.9 and 03/2016. - Hypoglycemic protocols - Hold metformin  - CBGs before meals with SSI  Chronic kidney disease stage III: Stable. Baseline Cr 2.14-2.81. Initial creatinine 2.5 with a BUN of 35. - Repeat BMP in a.m.  Obesity: BMI 42  Gout flare: Now resolved. Patient took 2 doses of prednisone with reported resolution of symptoms. - Discontinued steroid tapered due to patient reported hyperglycemia   DVT prophylaxis: lovenox Code Status: Full Family Communication: Discussed plan of care with patient and family present at bedside  Disposition Plan: Discharge home once medically stable  Consults called: Cardiology  Admission status:Observation  Norval [name] MD [facility] Pager [phone]   If 7PM-7AM, please contact night-coverage [email or web address] Password [facility]  03/31/2017, 11:34 PM

## 2017-03-31 NOTE — ED Triage Notes (Signed)
From home with chest pain on and off x several days. PT reports the pain returned this morning and has consistently gotten worse. Pt reports sharp pain to center of chest that radiates to left arm. No relief with use of nitro. EMS gave pt 4 nitro as well PTA. Pain 10/10. PT saw cardiology Friday and was told if pain continues they will cath again.  Pt received 324mg  ASA pta   162/102 Hr 102 st spo2 98% rr 16 cbg 244  #20 RH

## 2017-03-31 NOTE — ED Provider Notes (Addendum)
[facility] DEPT Provider Note   CSN: [id number] Arrival date & time: 03/31/17  1951     History   Chief Complaint No chief complaint on file.   HPI Matthew CHEVEZ Sr. is a 56 y.o. male.  HPI Patient presents with acute onset left-sided chest pain starting at 4:00 after eating. He states the pain is sharp and worse with deep breathing. Denies nausea or vomiting, shortness of breath. No radiation of the pain. States he took multiple doses of nitroglycerin without relief. Was recently started on prednisone for gout flare. States he had episodic pain yesterday and earlier today which resolved spontaneously. Past Medical History:  Diagnosis Date  . Anginal pain ([place])   . CKD (chronic kidney disease), stage III   . Coronary artery disease    a. INF MI 1999: PCI of RCA;  b.  extensive stenting of LAD;  c. s/p CABG with RIMA->RCA in 2006;  d. Stable, Low risk MV 05/2012; e. 09/2012 Cath: stable anatomy->med Rx. f. Abnl nuc 11/2013 -> med rx; g. 11/2013 Cath/PCI: LM 30d, LAD 50ost, 20/50isr, 100d, LCX 40-50ost, OM2 50ost, RI 70-80ost sup branch, RCA 20p, 90/60d s/p DES, PDA 60p, RIMA->RCA ok.  d. 11/2016: nuc with EF 35%, no reversible ischem  . Diastolic heart failure ([place])   . DM2 (diabetes mellitus, type 2) ([place])   . Dyslipidemia   . GERD (gastroesophageal reflux disease)   . History of gout   . Hx of echocardiogram    a. Echo 3/12: mild LVH, EF 55-60%, trivial AI  . Hypertensive heart disease   . Ischemic cardiomyopathy   . Myocardial infarction [facility]) 1999; 2002; 2003; 2006; ~ 2008  . OSA on CPAP   . Polycystic kidney disease     Patient Active Problem List   Diagnosis Date Noted  . Ischemic cardiomyopathy 02/21/2017  . CHF (congestive heart failure) ([place]) 02/08/2017  . Acute on chronic diastolic CHF (congestive heart failure) ([place]) 02/08/2017  . Hyperlipidemia LDL goal <70   . Stage 3 chronic kidney disease   . CRI (chronic renal insufficiency), stage 3 (moderate)   . GERD  (gastroesophageal reflux disease) 05/08/2015  . Gout 05/08/2015  . CAD- CABG 2006, RCA DES March 2015   . Hypertensive heart disease   . Chest pain 11/27/2013  . Financial difficulty 03/13/2013  . Medically noncompliant 03/13/2013  . Type 2 diabetes mellitus with renal manifestations ([place])   . Obesity-BMI 42 11/27/2012  . Polycystic kidney disease 09/15/2012  . OSA on CPAP 09/15/2012  . Essential hypertension 03/27/2011  . Dyslipidemia     Past Surgical History:  Procedure Laterality Date  . CARDIAC CATHETERIZATION  2012  . CARDIAC CATHETERIZATION  2013  . CARDIAC CATHETERIZATION  2014   LHC (1/14): total occlusion of distal LAD, diffuse disease in ramus, 70% proximal LCx, 50% proximal and mid RCA, 50% distal RCA, patent RIMA-RCA.  Marland Kitchen CORONARY ANGIOPLASTY WITH STENT PLACEMENT  1999 / 2002 / 2004 / 2006?  . CORONARY ARTERY BYPASS GRAFT  2006   CABG X?; in [place]  . CYSTECTOMY  1990's   off face  . LEFT HEART CATHETERIZATION WITH CORONARY/GRAFT ANGIOGRAM N/A 09/16/2012   Procedure: LEFT HEART CATHETERIZATION WITH [name];  Surgeon: [name], MD;  Location: [facility] CATH LAB;  Service: Cardiovascular;  Laterality: N/A;  . LEFT HEART CATHETERIZATION WITH CORONARY/GRAFT ANGIOGRAM N/A 12/02/2013   Procedure: LEFT HEART CATHETERIZATION WITH [name];  Surgeon: [name], MD;  Location: [facility] CATH LAB;  Service:  Cardiovascular;  Laterality: N/A;  . PERCUTANEOUS CORONARY STENT INTERVENTION (PCI-S) N/A 12/03/2013   Procedure: PERCUTANEOUS CORONARY STENT INTERVENTION (PCI-S);  Surgeon: [name], MD;  Location: [facility] C[facility] LAB;  Service: Cardiovascular;  Laterality: N/A;       Home Medications    Prior to Admission medications   Medication Sig Start Date End Date Taking? Authorizing Provider  acetaminophen (TYLENOL) 500 MG tablet Take 500-1,000 mg by mouth every 6 (six) hours as needed (for pain or headaches).   Yes [provider]    amLODipine (NORVASC) 10 MG tablet Take 1 tablet (10 mg total) by mouth daily. 01/12/15  Yes Copland, Gay Filler, MD  aspirin 81 MG tablet Take 1 tablet (81 mg total) by mouth daily. Patient taking differently: Take 162 mg by mouth 2 (two) times daily.  12/04/13  Yes Rogelia Mire, NP  atorvastatin (LIPITOR) 80 MG tablet Take 1 tablet (80 mg total) by mouth daily. 11/16/16 03/31/17 Yes Eileen Stanford, PA-C  carvedilol (COREG) 25 MG tablet Take 2 tablets (50 mg total) by mouth 2 (two) times daily. 02/19/17  Yes End, Harrell Gave, MD  clopidogrel (PLAVIX) 75 MG tablet TAKE ONE TABLET BY MOUTH ONCE DAILY WITH BREAKFAST Patient taking differently: Take 75 mg by mouth once a day with breakfast 12/25/16  Yes End, Harrell Gave, MD  diphenhydrAMINE (BENADRYL) 25 MG tablet Take 25 mg by mouth every 6 (six) hours as needed for allergies.   Yes [provider]  furosemide (LASIX) 40 MG tablet Take 1 tablet (40 mg total) by mouth daily. 03/29/17 06/27/17 Yes End, Harrell Gave, MD  gabapentin (NEURONTIN) 300 MG capsule Take 300 mg by mouth 3 (three) times daily.   Yes [provider]  hydrALAZINE (APRESOLINE) 50 MG tablet Take 1 tablet (50 mg total) by mouth 3 (three) times daily. 12/20/16  Yes End, Harrell Gave, MD  isosorbide mononitrate (IMDUR) 120 MG 24 hr tablet Take 2 tablets (240 mg total) by mouth daily. Patient taking differently: Take 120 mg by mouth 2 (two) times daily.  08/11/16  Yes Kilroy, Luke K, PA-C  metFORMIN (GLUCOPHAGE) 1000 MG tablet Take 1,000 mg by mouth 2 (two) times daily with a meal.   Yes [provider]  Multiple Vitamin (MULTIVITAMIN WITH MINERALS) TABS tablet Take 1 tablet by mouth daily.   Yes [provider]  nitroGLYCERIN (NITROLINGUAL) 0.4 MG/SPRAY spray Place 1 spray under the tongue every 5 (five) minutes x 3 doses as needed for chest pain. 12/25/16  Yes End, Harrell Gave, MD  Omega-3 Fatty Acids (FISH OIL) 1000 MG CAPS Take 2,000 mg by mouth 2  (two) times daily.   Yes [provider]  pantoprazole (PROTONIX) 40 MG tablet Take 40 mg by mouth daily.    Yes [provider]  predniSONE (DELTASONE) 20 MG tablet Take 10-40 mg by mouth See admin instructions. 40 mg once a day for 3 days then 20 mg once a day for 3 days then 10 mg once a day for 2 days then d/c   Yes [provider]  ranolazine (RANEXA) 500 MG 12 hr tablet Take 1 tablet (500 mg total) by mouth 2 (two) times daily. 03/29/17  Yes End, Harrell Gave, MD  vitamin E 400 UNIT capsule Take 400 Units by mouth daily.   Yes [provider]    Family History Family History  Problem Relation Age of Onset  . Lung cancer Mother   . Anemia Mother   . Polycystic kidney disease Mother   .  Diabetes Father   . Heart attack Father 76       Died suddenly  . Heart failure Father   . Hyperlipidemia Father   . Hypertension Father   . Kidney failure Sister 5       Died  . Heart attack Sister   . Hypertension Sister   . Diabetes Sister   . Diabetes Brother   . Polycystic kidney disease Brother   . Diabetes Sister     Social History Social History  Substance Use Topics  . Smoking status: Former Smoker    Types: Cigarettes  . Smokeless tobacco: Never Used     Comment: smoked some as a teenager (high school) not even a pack a week  . Alcohol use No     Comment: 09/15/2012 "drank a little when I was young"     Allergies   Ciprocin-fluocin-procin [fluocinolone acetonide]; Clarithromycin; Cleocin [clindamycin hcl]; Glimepiride [amaryl]; and Colchicine   Review of Systems Review of Systems  Constitutional: Negative for chills and fever.  Respiratory: Negative for cough and shortness of breath.   Cardiovascular: Positive for chest pain. Negative for palpitations and leg swelling.  Gastrointestinal: Positive for abdominal pain. Negative for constipation, diarrhea, nausea and vomiting.  Musculoskeletal: Negative for back pain, myalgias, neck pain and  neck stiffness.  Skin: Negative for rash and wound.  Neurological: Negative for dizziness, weakness, light-headedness, numbness and headaches.  All other systems reviewed and are negative.    Physical Exam Updated Vital Signs BP (!) 145/85   Pulse (!) 109   Temp 98.7 F (37.1 C) (Oral)   Resp 20   Ht 5\' 5"  (1.651 m)   Wt 107.5 kg (237 lb)   SpO2 98%   BMI 39.44 kg/m   Physical Exam  Constitutional: He is oriented to person, place, and time. He appears well-developed and well-nourished.  HENT:  Head: Normocephalic and atraumatic.  Mouth/Throat: Oropharynx is clear and moist.  Eyes: Pupils are equal, round, and reactive to light. EOM are normal.  Neck: Normal range of motion. Neck supple.  Cardiovascular: Normal rate and regular rhythm.  Exam reveals no gallop and no friction rub.   No murmur heard. Pulmonary/Chest: Effort normal and breath sounds normal. No respiratory distress. He has no wheezes. He has no rales. He exhibits tenderness.  Abdominal: Soft. Bowel sounds are normal. There is tenderness. There is no rebound and no guarding.  Epigastric and left upper quadrant tenderness to palpation.  Musculoskeletal: Normal range of motion. He exhibits no edema or tenderness.  No CVA tenderness. No midline thoracic or lumbar tenderness. No lower extremity swelling, asymmetry or tenderness. 2+ distal pulses in all extremities.  Neurological: He is alert and oriented to person, place, and time.  Skin: Skin is warm and dry. Capillary refill takes less than 2 seconds. No rash noted. No erythema.  Psychiatric: He has a normal mood and affect. His behavior is normal.  Nursing note and vitals reviewed.    ED Treatments / Results  Labs (all labs ordered are listed, but only abnormal results are displayed) Labs Reviewed  CBC - Abnormal; Notable for the following:       Result Value   WBC 13.5 (*)    RBC 3.73 (*)    Hemoglobin 10.3 (*)    HCT 32.3 (*)    All other components  within normal limits  HEPATIC FUNCTION PANEL - Abnormal; Notable for the following:    Bilirubin, Direct <0.1 (*)    All other  components within normal limits  URINALYSIS, ROUTINE W REFLEX MICROSCOPIC - Abnormal; Notable for the following:    Protein, ur 100 (*)    All other components within normal limits  LIPASE, BLOOD  I-STAT TROPONIN, ED    EKG  EKG Interpretation  Date/Time:  Sunday March 31 2017 19:52:14 EDT Ventricular Rate:  106 PR Interval:    QRS Duration: 130 QT Interval:  353 QTC Calculation: 469 R Axis:   -51 Text Interpretation:  Sinus tachycardia Left bundle branch block Confirmed by Langdon Crosson  MD, Jelicia Nantz ([id number]) on 03/31/2017 10:53:28 PM       Radiology Dg Chest Port 1 View  Result Date: 03/31/2017 CLINICAL DATA:  Chest pain for several days EXAM: PORTABLE CHEST 1 VIEW COMPARISON:  02/07/2017 FINDINGS: Post sternotomy changes with fracture through fourth sternal wire as before. Mild cardiomegaly with mild central congestion. No focal consolidation or effusion. No pneumothorax. IMPRESSION: Mild cardiomegaly with central congestion.  No edema or infiltrate. Electronically Signed   By: Donavan Foil M.D.   On: 03/31/2017 20:34    Procedures Procedures (including critical care time)  Medications Ordered in ED Medications  morphine 4 MG/ML injection 4 mg (4 mg Intravenous Given 03/31/17 2010)  gi cocktail (Maalox,Lidocaine,Donnatal) (30 mLs Oral Given 03/31/17 2143)  morphine 4 MG/ML injection 4 mg (4 mg Intravenous Given 03/31/17 2316)     Initial Impression / Assessment and Plan / ED Course  I have reviewed the triage vital signs and the nursing notes.  Pertinent labs & imaging results that were available during my care of the patient were reviewed by me and considered in my medical decision making (see chart for details).     Patient states his pain is significantly improved after morphine and GI cocktail. Discussed with cardiology fellow who saw patient in  the emergency department. Reviewed EKG. Pain is reproducible with palpation. Believe this is atypical for coronary artery disease. Asked to have internal medicine service admit for rule out and will consult in the morning.  Discussed with Dr. Tamala Julian, hospitalist. Will see patient in emergency department and admit. Final Clinical Impressions(s) / ED Diagnoses   Final diagnoses:  Precordial chest pain    New Prescriptions New Prescriptions   No medications on file     Julianne Rice, MD 03/31/17 2330    Julianne Rice, MD 04/19/17 [phone]

## 2017-04-01 ENCOUNTER — Telehealth: Payer: Self-pay | Admitting: Internal Medicine

## 2017-04-01 DIAGNOSIS — I5042 Chronic combined systolic (congestive) and diastolic (congestive) heart failure: Secondary | ICD-10-CM | POA: Diagnosis present

## 2017-04-01 DIAGNOSIS — Z79899 Other long term (current) drug therapy: Secondary | ICD-10-CM | POA: Diagnosis not present

## 2017-04-01 DIAGNOSIS — E1165 Type 2 diabetes mellitus with hyperglycemia: Secondary | ICD-10-CM | POA: Diagnosis present

## 2017-04-01 DIAGNOSIS — E669 Obesity, unspecified: Secondary | ICD-10-CM | POA: Diagnosis present

## 2017-04-01 DIAGNOSIS — Z9989 Dependence on other enabling machines and devices: Secondary | ICD-10-CM | POA: Diagnosis not present

## 2017-04-01 DIAGNOSIS — Z951 Presence of aortocoronary bypass graft: Secondary | ICD-10-CM | POA: Diagnosis not present

## 2017-04-01 DIAGNOSIS — Z955 Presence of coronary angioplasty implant and graft: Secondary | ICD-10-CM | POA: Diagnosis not present

## 2017-04-01 DIAGNOSIS — D631 Anemia in chronic kidney disease: Secondary | ICD-10-CM | POA: Diagnosis present

## 2017-04-01 DIAGNOSIS — I25110 Atherosclerotic heart disease of native coronary artery with unstable angina pectoris: Secondary | ICD-10-CM | POA: Diagnosis present

## 2017-04-01 DIAGNOSIS — T380X5A Adverse effect of glucocorticoids and synthetic analogues, initial encounter: Secondary | ICD-10-CM | POA: Diagnosis present

## 2017-04-01 DIAGNOSIS — M109 Gout, unspecified: Secondary | ICD-10-CM | POA: Diagnosis present

## 2017-04-01 DIAGNOSIS — Z7982 Long term (current) use of aspirin: Secondary | ICD-10-CM | POA: Diagnosis not present

## 2017-04-01 DIAGNOSIS — I214 Non-ST elevation (NSTEMI) myocardial infarction: Secondary | ICD-10-CM | POA: Diagnosis present

## 2017-04-01 DIAGNOSIS — E785 Hyperlipidemia, unspecified: Secondary | ICD-10-CM | POA: Diagnosis present

## 2017-04-01 DIAGNOSIS — N183 Chronic kidney disease, stage 3 (moderate): Secondary | ICD-10-CM

## 2017-04-01 DIAGNOSIS — Z833 Family history of diabetes mellitus: Secondary | ICD-10-CM | POA: Diagnosis not present

## 2017-04-01 DIAGNOSIS — R079 Chest pain, unspecified: Secondary | ICD-10-CM | POA: Diagnosis not present

## 2017-04-01 DIAGNOSIS — E875 Hyperkalemia: Secondary | ICD-10-CM | POA: Diagnosis present

## 2017-04-01 DIAGNOSIS — Q613 Polycystic kidney, unspecified: Secondary | ICD-10-CM | POA: Diagnosis not present

## 2017-04-01 DIAGNOSIS — G4733 Obstructive sleep apnea (adult) (pediatric): Secondary | ICD-10-CM | POA: Diagnosis present

## 2017-04-01 DIAGNOSIS — K219 Gastro-esophageal reflux disease without esophagitis: Secondary | ICD-10-CM | POA: Diagnosis present

## 2017-04-01 DIAGNOSIS — Z7984 Long term (current) use of oral hypoglycemic drugs: Secondary | ICD-10-CM | POA: Diagnosis not present

## 2017-04-01 DIAGNOSIS — I130 Hypertensive heart and chronic kidney disease with heart failure and stage 1 through stage 4 chronic kidney disease, or unspecified chronic kidney disease: Secondary | ICD-10-CM | POA: Diagnosis present

## 2017-04-01 DIAGNOSIS — I10 Essential (primary) hypertension: Secondary | ICD-10-CM | POA: Diagnosis not present

## 2017-04-01 DIAGNOSIS — R072 Precordial pain: Secondary | ICD-10-CM | POA: Diagnosis present

## 2017-04-01 DIAGNOSIS — Z8249 Family history of ischemic heart disease and other diseases of the circulatory system: Secondary | ICD-10-CM | POA: Diagnosis not present

## 2017-04-01 DIAGNOSIS — E1122 Type 2 diabetes mellitus with diabetic chronic kidney disease: Secondary | ICD-10-CM | POA: Diagnosis present

## 2017-04-01 DIAGNOSIS — N179 Acute kidney failure, unspecified: Secondary | ICD-10-CM | POA: Diagnosis present

## 2017-04-01 DIAGNOSIS — Z6841 Body Mass Index (BMI) 40.0 and over, adult: Secondary | ICD-10-CM | POA: Diagnosis not present

## 2017-04-01 LAB — CBC WITH DIFFERENTIAL/PLATELET
BASOS ABS: 0 10*3/uL (ref 0.0–0.1)
BASOS PCT: 0 %
EOS ABS: 0 10*3/uL (ref 0.0–0.7)
EOS PCT: 0 %
HCT: 31.4 % — ABNORMAL LOW (ref 39.0–52.0)
Hemoglobin: 10.2 g/dL — ABNORMAL LOW (ref 13.0–17.0)
Lymphocytes Relative: 14 %
Lymphs Abs: 1.8 10*3/uL (ref 0.7–4.0)
MCH: 28.2 pg (ref 26.0–34.0)
MCHC: 32.5 g/dL (ref 30.0–36.0)
MCV: 86.7 fL (ref 78.0–100.0)
MONO ABS: 0.9 10*3/uL (ref 0.1–1.0)
MONOS PCT: 7 %
Neutro Abs: 10.4 10*3/uL — ABNORMAL HIGH (ref 1.7–7.7)
Neutrophils Relative %: 79 %
PLATELETS: 241 10*3/uL (ref 150–400)
RBC: 3.62 MIL/uL — ABNORMAL LOW (ref 4.22–5.81)
RDW: 14.1 % (ref 11.5–15.5)
WBC: 13.1 10*3/uL — ABNORMAL HIGH (ref 4.0–10.5)

## 2017-04-01 LAB — BASIC METABOLIC PANEL
ANION GAP: 15 (ref 5–15)
Anion gap: 12 (ref 5–15)
BUN: 46 mg/dL — ABNORMAL HIGH (ref 6–20)
BUN: 49 mg/dL — AB (ref 6–20)
CALCIUM: 9.1 mg/dL (ref 8.9–10.3)
CO2: 17 mmol/L — ABNORMAL LOW (ref 22–32)
CO2: 17 mmol/L — ABNORMAL LOW (ref 22–32)
CREATININE: 3.11 mg/dL — AB (ref 0.61–1.24)
CREATININE: 3.13 mg/dL — AB (ref 0.61–1.24)
Calcium: 9.1 mg/dL (ref 8.9–10.3)
Chloride: 109 mmol/L (ref 101–111)
Chloride: 107 mmol/L (ref 101–111)
GFR, EST AFRICAN AMERICAN: 24 mL/min — AB (ref >60)
GFR, EST AFRICAN AMERICAN: 24 mL/min — AB (ref >60)
GFR, EST NON AFRICAN AMERICAN: 21 mL/min — AB (ref >60)
GFR, EST NON AFRICAN AMERICAN: 21 mL/min — AB (ref >60)
Glucose, Bld: 102 mg/dL — ABNORMAL HIGH (ref 65–99)
Glucose, Bld: 133 mg/dL — ABNORMAL HIGH (ref 65–99)
POTASSIUM: 4.7 mmol/L (ref 3.5–5.1)
Potassium: 6.1 mmol/L — ABNORMAL HIGH (ref 3.5–5.1)
SODIUM: 136 mmol/L (ref 135–145)
Sodium: 141 mmol/L (ref 135–145)

## 2017-04-01 LAB — TROPONIN I
TROPONIN I: 0.65 ng/mL — AB (ref <0.03)
TROPONIN I: 1.54 ng/mL — AB (ref <0.03)
TROPONIN I: 1.71 ng/mL — AB (ref <0.03)

## 2017-04-01 LAB — MRSA PCR SCREENING: MRSA by PCR: NEGATIVE

## 2017-04-01 LAB — GLUCOSE, CAPILLARY
GLUCOSE-CAPILLARY: 150 mg/dL — AB (ref 65–99)
GLUCOSE-CAPILLARY: 153 mg/dL — AB (ref 65–99)
GLUCOSE-CAPILLARY: 98 mg/dL (ref 65–99)
Glucose-Capillary: 121 mg/dL — ABNORMAL HIGH (ref 65–99)

## 2017-04-01 LAB — LIPID PANEL
Cholesterol: 146 mg/dL (ref 0–200)
HDL: 29 mg/dL — ABNORMAL LOW (ref >40)
LDL CALC: 94 mg/dL (ref 0–99)
TRIGLYCERIDES: 116 mg/dL (ref <150)
Total CHOL/HDL Ratio: 5 RATIO
VLDL: 23 mg/dL (ref 0–40)

## 2017-04-01 LAB — HEPARIN LEVEL (UNFRACTIONATED)
HEPARIN UNFRACTIONATED: 0.78 [IU]/mL — AB (ref 0.30–0.70)
Heparin Unfractionated: 0.78 IU/mL — ABNORMAL HIGH (ref 0.30–0.70)

## 2017-04-01 MED ORDER — FUROSEMIDE 40 MG PO TABS
40.0000 mg | ORAL_TABLET | Freq: Every day | ORAL | Status: DC
  Administered 2017-04-01 – 2017-04-03 (×3): 40 mg via ORAL
  Filled 2017-04-01 (×3): qty 1

## 2017-04-01 MED ORDER — HEPARIN (PORCINE) IN NACL 100-0.45 UNIT/ML-% IJ SOLN
1050.0000 [IU]/h | INTRAMUSCULAR | Status: DC
  Administered 2017-04-01: 1050 [IU]/h via INTRAVENOUS
  Administered 2017-04-01: 1200 [IU]/h via INTRAVENOUS
  Filled 2017-04-01 (×2): qty 250

## 2017-04-01 MED ORDER — CARVEDILOL 25 MG PO TABS
50.0000 mg | ORAL_TABLET | Freq: Two times a day (BID) | ORAL | Status: DC
  Administered 2017-04-01 – 2017-04-03 (×4): 50 mg via ORAL
  Filled 2017-04-01 (×6): qty 2

## 2017-04-01 MED ORDER — HYDRALAZINE HCL 50 MG PO TABS
50.0000 mg | ORAL_TABLET | Freq: Three times a day (TID) | ORAL | Status: DC
  Administered 2017-04-01 – 2017-04-03 (×7): 50 mg via ORAL
  Filled 2017-04-01 (×7): qty 1

## 2017-04-01 MED ORDER — RANOLAZINE ER 500 MG PO TB12: 500.0000 mg | ORAL_TABLET | Freq: Two times a day (BID) | ORAL | Status: DC

## 2017-04-01 MED ORDER — NITROGLYCERIN 0.4 MG SL SUBL: 0.4000 mg | SUBLINGUAL_TABLET | SUBLINGUAL | Status: DC | PRN

## 2017-04-01 MED ORDER — PREDNISOLONE 5 MG PO TABS
20.0000 mg | ORAL_TABLET | Freq: Every day | ORAL | Status: DC
  Filled 2017-04-01: qty 4

## 2017-04-01 MED ORDER — AMLODIPINE BESYLATE 10 MG PO TABS
10.0000 mg | ORAL_TABLET | Freq: Every day | ORAL | Status: DC
  Administered 2017-04-01 – 2017-04-03 (×3): 10 mg via ORAL
  Filled 2017-04-01 (×3): qty 1

## 2017-04-01 MED ORDER — INSULIN ASPART 100 UNIT/ML ~~LOC~~ SOLN
0.0000 [IU] | Freq: Three times a day (TID) | SUBCUTANEOUS | Status: DC
  Administered 2017-04-01: 2 [IU] via SUBCUTANEOUS
  Administered 2017-04-01: 3 [IU] via SUBCUTANEOUS
  Administered 2017-04-02: 2 [IU] via SUBCUTANEOUS
  Administered 2017-04-02 – 2017-04-03 (×3): 3 [IU] via SUBCUTANEOUS

## 2017-04-01 MED ORDER — HEPARIN BOLUS VIA INFUSION
4000.0000 [IU] | Freq: Once | INTRAVENOUS | Status: AC
  Administered 2017-04-01: 4000 [IU] via INTRAVENOUS
  Filled 2017-04-01: qty 4000

## 2017-04-01 MED ORDER — ATORVASTATIN CALCIUM 80 MG PO TABS
80.0000 mg | ORAL_TABLET | Freq: Every day | ORAL | Status: DC
  Administered 2017-04-01 – 2017-04-03 (×3): 80 mg via ORAL
  Filled 2017-04-01 (×3): qty 1

## 2017-04-01 MED ORDER — CARVEDILOL 25 MG PO TABS: 50.0000 mg | ORAL_TABLET | Freq: Two times a day (BID) | ORAL | Status: DC

## 2017-04-01 MED ORDER — ASPIRIN EC 325 MG PO TBEC
325.0000 mg | DELAYED_RELEASE_TABLET | Freq: Every day | ORAL | Status: DC
  Administered 2017-04-01 – 2017-04-03 (×3): 325 mg via ORAL
  Filled 2017-04-01 (×3): qty 1

## 2017-04-01 MED ORDER — ENOXAPARIN SODIUM 40 MG/0.4ML ~~LOC~~ SOLN: 40.0000 mg | SUBCUTANEOUS | Status: DC

## 2017-04-01 MED ORDER — MORPHINE SULFATE (PF) 2 MG/ML IV SOLN: 2.0000 mg | INTRAVENOUS | Status: DC | PRN

## 2017-04-01 MED ORDER — DIPHENHYDRAMINE HCL 25 MG PO CAPS: 25.0000 mg | ORAL_CAPSULE | Freq: Four times a day (QID) | ORAL | Status: DC | PRN

## 2017-04-01 MED ORDER — HEPARIN (PORCINE) IN NACL 100-0.45 UNIT/ML-% IJ SOLN: 900.0000 [IU]/h | INTRAMUSCULAR | Status: DC

## 2017-04-01 MED ORDER — ASPIRIN EC 81 MG PO TBEC
162.0000 mg | DELAYED_RELEASE_TABLET | Freq: Two times a day (BID) | ORAL | Status: DC
Start: 2017-04-01 — End: 2017-04-01

## 2017-04-01 MED ORDER — CLOPIDOGREL BISULFATE 75 MG PO TABS
75.0000 mg | ORAL_TABLET | Freq: Every day | ORAL | Status: DC
  Administered 2017-04-01 – 2017-04-03 (×3): 75 mg via ORAL
  Filled 2017-04-01 (×6): qty 1

## 2017-04-01 MED ORDER — GABAPENTIN 300 MG PO CAPS
300.0000 mg | ORAL_CAPSULE | Freq: Three times a day (TID) | ORAL | Status: DC
  Administered 2017-04-01: 300 mg via ORAL
  Filled 2017-04-01: qty 1

## 2017-04-01 MED ORDER — SODIUM CHLORIDE 0.9 % IV SOLN
INTRAVENOUS | Status: DC
  Administered 2017-04-01: 02:00:00 via INTRAVENOUS

## 2017-04-01 MED ORDER — PANTOPRAZOLE SODIUM 40 MG PO TBEC
40.0000 mg | DELAYED_RELEASE_TABLET | Freq: Every day | ORAL | Status: DC
  Administered 2017-04-01 – 2017-04-03 (×3): 40 mg via ORAL
  Filled 2017-04-01 (×3): qty 1

## 2017-04-01 MED ORDER — GI COCKTAIL ~~LOC~~: 30.0000 mL | Freq: Four times a day (QID) | ORAL | Status: DC | PRN

## 2017-04-01 MED ORDER — RANOLAZINE ER 500 MG PO TB12
500.0000 mg | ORAL_TABLET | Freq: Two times a day (BID) | ORAL | Status: DC
  Administered 2017-04-01 – 2017-04-03 (×5): 500 mg via ORAL
  Filled 2017-04-01 (×5): qty 1

## 2017-04-01 MED ORDER — GABAPENTIN 300 MG PO CAPS
300.0000 mg | ORAL_CAPSULE | Freq: Two times a day (BID) | ORAL | Status: DC
  Administered 2017-04-01 – 2017-04-03 (×4): 300 mg via ORAL
  Filled 2017-04-01 (×4): qty 1

## 2017-04-01 MED ORDER — ACETAMINOPHEN 325 MG PO TABS: 650.0000 mg | ORAL_TABLET | ORAL | Status: DC | PRN

## 2017-04-01 MED ORDER — ONDANSETRON HCL 4 MG/2ML IJ SOLN: 4.0000 mg | Freq: Four times a day (QID) | INTRAMUSCULAR | Status: DC | PRN

## 2017-04-01 MED ORDER — PREDNISONE 20 MG PO TABS: 20.0000 mg | ORAL_TABLET | Freq: Every day | ORAL | Status: DC

## 2017-04-01 MED ORDER — ISOSORBIDE MONONITRATE ER 60 MG PO TB24
120.0000 mg | ORAL_TABLET | Freq: Two times a day (BID) | ORAL | Status: DC
  Administered 2017-04-01 – 2017-04-03 (×5): 120 mg via ORAL
  Filled 2017-04-01 (×5): qty 2

## 2017-04-01 MED ORDER — PREDNISOLONE 5 MG PO TABS: 10.0000 mg | ORAL_TABLET | Freq: Every day | ORAL | Status: DC

## 2017-04-01 NOTE — Consult Note (Signed)
Karie Kirks Sr. Admit Date: 03/31/2017 04/01/2017 Alphonzo Grieve Requesting Physician:  Dr. Eliseo Squires  Reason for Consult:  AKI HPI:  Mr. Dusza is a 56yo male with PMH of T2DM, CKD stage 3, gout, CAD s/p PCI who presented to [facility] with chest pain. Patient recently started back on Ranexa for angina and had a gout flare which was treated with prednisone. Patient had subsequent elevated CBGs so he self treated with 3500 metformin. He had no recent IV contrast. He denies NSAID use for the past 2-3 months. No recent antibiotics. He states he's had good urine output, denies hesitancy, denies hematuria.  Renal ultrasound from 2016 shows multiple bilateral cysts consistent with polycystic kidney disease.  Patient currently denying chest pain; he states his edema seems to have improved since admission.  PMH Incudes:  T2DM  CKD stage 3  CAD s/p PCI  PCKD  Combined CHF EF 45-50%, G1DD   Creat (mg/dL)  Date Value  04/19/2016 2.47 (H)  01/25/2016 2.53 (H)  01/23/2016 2.72 (H)  11/24/2014 1.92 (H)  10/11/2014 1.98 (H)  09/30/2013 2.00 (H)  05/28/2013 1.96 (H)  03/27/2012 1.75 (H)   Creatinine, Ser (mg/dL)  Date Value  04/01/2017 3.11 (H)  03/29/2017 2.52 (H)  02/21/2017 2.81 (H)  02/09/2017 2.76 (H)  02/08/2017 2.50 (H)  02/07/2017 2.44 (H)  11/16/2016 2.14 (H)  11/15/2016 2.42 (H)  08/10/2016 2.22 (H)  08/09/2016 2.29 (H)   I/Os: -1L last night  ROS Balance of 12 systems is negative w/ exceptions as above  PMH  Past Medical History:  Diagnosis Date  . Anginal pain ([place])   . CKD (chronic kidney disease), stage III   . Coronary artery disease    a. INF MI 1999: PCI of RCA;  b.  extensive stenting of LAD;  c. s/p CABG with RIMA->RCA in 2006;  d. Stable, Low risk MV 05/2012; e. 09/2012 Cath: stable anatomy->med Rx. f. Abnl nuc 11/2013 -> med rx; g. 11/2013 Cath/PCI: LM 30d, LAD 50ost, 20/50isr, 100d, LCX 40-50ost, OM2 50ost, RI 70-80ost sup branch, RCA 20p, 90/60d s/p DES, PDA  60p, RIMA->RCA ok.  d. 11/2016: nuc with EF 35%, no reversible ischem  . Diastolic heart failure ([place])   . DM2 (diabetes mellitus, type 2) ([place])   . Dyslipidemia   . GERD (gastroesophageal reflux disease)   . History of gout   . Hx of echocardiogram    a. Echo 3/12: mild LVH, EF 55-60%, trivial AI  . Hypertensive heart disease   . Ischemic cardiomyopathy   . Myocardial infarction [facility]) 1999; 2002; 2003; 2006; ~ 2008  . OSA on CPAP   . Polycystic kidney disease    PSH  Past Surgical History:  Procedure Laterality Date  . CARDIAC CATHETERIZATION  2012  . CARDIAC CATHETERIZATION  2013  . CARDIAC CATHETERIZATION  2014   LHC (1/14): total occlusion of distal LAD, diffuse disease in ramus, 70% proximal LCx, 50% proximal and mid RCA, 50% distal RCA, patent RIMA-RCA.  Marland Kitchen CORONARY ANGIOPLASTY WITH STENT PLACEMENT  1999 / 2002 / 2004 / 2006?  . CORONARY ARTERY BYPASS GRAFT  2006   CABG X?; in [place]  . CYSTECTOMY  1990's   off face  . LEFT HEART CATHETERIZATION WITH CORONARY/GRAFT ANGIOGRAM N/A 09/16/2012   Procedure: LEFT HEART CATHETERIZATION WITH [name];  Surgeon: [name], MD;  Location: [facility] CATH LAB;  Service: Cardiovascular;  Laterality: N/A;  . LEFT HEART CATHETERIZATION WITH CORONARY/GRAFT ANGIOGRAM N/A 12/02/2013   Procedure: LEFT HEART  CATHETERIZATION WITH [name];  Surgeon: [name], MD;  Location: [facility] CATH LAB;  Service: Cardiovascular;  Laterality: N/A;  . PERCUTANEOUS CORONARY STENT INTERVENTION (PCI-S) N/A 12/03/2013   Procedure: PERCUTANEOUS CORONARY STENT INTERVENTION (PCI-S);  Surgeon: [name], MD;  Location: [facility] CATH LAB;  Service: Cardiovascular;  Laterality: N/A;   FH  Family History  Problem Relation Age of Onset  . Lung cancer Mother   . Anemia Mother   . Polycystic kidney disease Mother   . Diabetes Father   . Heart attack Father 16       Died suddenly  . Heart failure Father   . Hyperlipidemia Father   .  Hypertension Father   . Kidney failure Sister 5       Died  . Heart attack Sister   . Hypertension Sister   . Diabetes Sister   . Diabetes Brother   . Polycystic kidney disease Brother   . Diabetes Sister    SH  reports that he has quit smoking. His smoking use included Cigarettes. He has never used smokeless tobacco. He reports that he does not drink alcohol or use drugs. Allergies  Allergies  Allergen Reactions  . Ciprocin-Fluocin-Procin [Fluocinolone Acetonide] Rash  . Clarithromycin Itching and Other (See Comments)    "Biaxin" Eyes itch and burn  . Cleocin [Clindamycin Hcl] Anaphylaxis and Swelling  . Glimepiride [Amaryl] Other (See Comments)    Elevates liver function   . Colchicine Other (See Comments)    Affected kidneys   Home medications Prior to Admission medications   Medication Sig Start Date End Date Taking? Authorizing Provider  acetaminophen (TYLENOL) 500 MG tablet Take 500-1,000 mg by mouth every 6 (six) hours as needed (for pain or headaches).   Yes [provider]  amLODipine (NORVASC) 10 MG tablet Take 1 tablet (10 mg total) by mouth daily. 01/12/15  Yes Copland, Gay Filler, MD  aspirin 81 MG tablet Take 1 tablet (81 mg total) by mouth daily. Patient taking differently: Take 162 mg by mouth 2 (two) times daily.  12/04/13  Yes Rogelia Mire, NP  atorvastatin (LIPITOR) 80 MG tablet Take 1 tablet (80 mg total) by mouth daily. 11/16/16 03/31/17 Yes Eileen Stanford, PA-C  carvedilol (COREG) 25 MG tablet Take 2 tablets (50 mg total) by mouth 2 (two) times daily. 02/19/17  Yes End, Harrell Gave, MD  clopidogrel (PLAVIX) 75 MG tablet TAKE ONE TABLET BY MOUTH ONCE DAILY WITH BREAKFAST Patient taking differently: Take 75 mg by mouth once a day with breakfast 12/25/16  Yes End, Harrell Gave, MD  diphenhydrAMINE (BENADRYL) 25 MG tablet Take 25 mg by mouth every 6 (six) hours as needed for allergies.   Yes [provider]  furosemide (LASIX) 40 MG tablet  Take 1 tablet (40 mg total) by mouth daily. 03/29/17 06/27/17 Yes End, Harrell Gave, MD  gabapentin (NEURONTIN) 300 MG capsule Take 300 mg by mouth 3 (three) times daily.   Yes [provider]  hydrALAZINE (APRESOLINE) 50 MG tablet Take 1 tablet (50 mg total) by mouth 3 (three) times daily. 12/20/16  Yes End, Harrell Gave, MD  isosorbide mononitrate (IMDUR) 120 MG 24 hr tablet Take 2 tablets (240 mg total) by mouth daily. Patient taking differently: Take 120 mg by mouth 2 (two) times daily.  08/11/16  Yes Kilroy, Luke K, PA-C  metFORMIN (GLUCOPHAGE) 1000 MG tablet Take 1,000 mg by mouth 2 (two) times daily with a meal.   Yes [provider]  Multiple Vitamin (MULTIVITAMIN  WITH MINERALS) TABS tablet Take 1 tablet by mouth daily.   Yes [provider]  nitroGLYCERIN (NITROLINGUAL) 0.4 MG/SPRAY spray Place 1 spray under the tongue every 5 (five) minutes x 3 doses as needed for chest pain. 12/25/16  Yes End, Harrell Gave, MD  Omega-3 Fatty Acids (FISH OIL) 1000 MG CAPS Take 2,000 mg by mouth 2 (two) times daily.   Yes [provider]  pantoprazole (PROTONIX) 40 MG tablet Take 40 mg by mouth daily.    Yes [provider]  predniSONE (DELTASONE) 20 MG tablet Take 10-40 mg by mouth See admin instructions. 40 mg once a day for 3 days then 20 mg once a day for 3 days then 10 mg once a day for 2 days then d/c   Yes [provider]  ranolazine (RANEXA) 500 MG 12 hr tablet Take 1 tablet (500 mg total) by mouth 2 (two) times daily. 03/29/17  Yes End, Harrell Gave, MD  vitamin E 400 UNIT capsule Take 400 Units by mouth daily.   Yes [provider]    Current Medications Scheduled Meds: . amLODipine  10 mg Oral Daily  . aspirin EC  325 mg Oral Daily  . atorvastatin  80 mg Oral Daily  . carvedilol  50 mg Oral BID WC  . clopidogrel  75 mg Oral Daily  . furosemide  40 mg Oral Daily  . gabapentin  300 mg Oral TID  . hydrALAZINE  50 mg Oral TID  . insulin  aspart  0-15 Units Subcutaneous TID WC  . isosorbide mononitrate  120 mg Oral BID  . pantoprazole  40 mg Oral Daily  . ranolazine  500 mg Oral BID   Continuous Infusions: . heparin 1,200 Units/hr (04/01/17 0323)   PRN Meds:.acetaminophen, diphenhydrAMINE, gi cocktail, morphine injection, nitroGLYCERIN, ondansetron (ZOFRAN) IV  CBC  Recent Labs Lab 03/31/17 2006 04/01/17 0356  WBC 13.5* 13.1*  NEUTROABS  --  10.4*  HGB 10.3* 10.2*  HCT 32.3* 31.4*  MCV 86.6 86.7  PLT 249 812   Basic Metabolic Panel  Recent Labs Lab 03/29/17 1352 04/01/17 0356  NA 140 141  K 4.9 6.1*  CL 106 109  CO2 17* 17*  GLUCOSE 133* 102*  BUN 35* 46*  CREATININE 2.52* 3.11*  CALCIUM 9.2 9.1    Physical Exam  Blood pressure 124/73, pulse 92, temperature 98.3 F (36.8 C), temperature source Oral, resp. rate (!) 26, height 5\' 5"  (1.651 m), weight 237 lb (107.5 kg), SpO2 95 %. GEN: NAD, pleasant ENT: moist mucous membranes EYES: anicteric CV: RRR, systolic murmur, no rubs or gallops, minimal LE edema PULM: CTAB, no increased work of breathing ABD: obese, nontender SKIN: no rashes EXT: moves all extremities freely; warm, pulses intact  Assessment Mr. Barren is a 56yo male with PCKD, CKD stage 3, T2DM, CAD s/p stenting with AKI and recommendation for cardiac catheterization in setting of NSTEMI. 1. AoCKD stage 3, PCKD - Cr 3.1 from 2.5 on admission which is around his baseline. Patient took access amount of metformin. K is elevated at 6.1 from 4.9 on admission - asymptomatic, no changes on telemetry. 2. NSTEMI - cardiology recommending cath in setting of known CAD - on heparin gtt, asa, plavix 3. HTN - on amlodipine 10mg  daily, carvedilol 50mg  BID, imdur 120mg  BID, hydralazine 50mg  TID 4. T2DM - was on metformin prior to admission  Plan 1. Repeat Bmet, EKG now 2. F/u renal function in AM 3. Tentatively going for cardiac cath tomorrow AM 4.  Avoid nephrotoxic agents as able (no NSAIDs,  judicious use of contrast) 5. Stop metformin, consider switching to different oral agent on discharge 6. Continue HTN meds  Alphonzo Grieve, MD PGY - 2 04/01/2017, 11:49 AM

## 2017-04-01 NOTE — Progress Notes (Addendum)
Troponin result reported to on-call [facility].   Cardiology notified.

## 2017-04-01 NOTE — Telephone Encounter (Signed)
Pt's wife calling to let you know pt is in [facility] since yesterday due to chest pain

## 2017-04-01 NOTE — Progress Notes (Signed)
[place] for Heparin  Indication: chest pain/ACS  Allergies  Allergen Reactions  . Ciprocin-Fluocin-Procin [Fluocinolone Acetonide] Rash  . Clarithromycin Itching and Other (See Comments)    "Biaxin" Eyes itch and burn  . Cleocin [Clindamycin Hcl] Anaphylaxis and Swelling  . Glimepiride [Amaryl] Other (See Comments)    Elevates liver function   . Colchicine Other (See Comments)    Affected kidneys   Patient Measurements: Height: 5\' 5"  (165.1 cm) Weight: 237 lb (107.5 kg) IBW/kg (Calculated) : 61.5  Vital Signs: Temp: 97.5 F (36.4 C) (07/23 1319) Temp Source: Oral (07/23 1319) BP: 116/62 (07/23 2105) Pulse Rate: 88 (07/23 1319)  Labs:  Recent Labs  03/31/17 2006 04/01/17 0112 04/01/17 0356 04/01/17 0608 04/01/17 1007 04/01/17 1353 04/01/17 1949  HGB 10.3*  --  10.2*  --   --   --   --   HCT 32.3*  --  31.4*  --   --   --   --   PLT 249  --  241  --   --   --   --   HEPARINUNFRC  --   --   --   --  0.78*  --  0.78*  CREATININE  --   --  3.11*  --   --  3.13*  --   TROPONINI  --  0.65* 1.54* 1.71*  --   --   --     Estimated Creatinine Clearance: 29.4 mL/min (A) (by C-G formula based on SCr of 3.13 mg/dL (H)).   Medical History: Past Medical History:  Diagnosis Date  . Anginal pain ([place])   . CKD (chronic kidney disease), stage III   . Coronary artery disease    a. INF MI 1999: PCI of RCA;  b.  extensive stenting of LAD;  c. s/p CABG with RIMA->RCA in 2006;  d. Stable, Low risk MV 05/2012; e. 09/2012 Cath: stable anatomy->med Rx. f. Abnl nuc 11/2013 -> med rx; g. 11/2013 Cath/PCI: LM 30d, LAD 50ost, 20/50isr, 100d, LCX 40-50ost, OM2 50ost, RI 70-80ost sup branch, RCA 20p, 90/60d s/p DES, PDA 60p, RIMA->RCA ok.  d. 11/2016: nuc with EF 35%, no reversible ischem  . Diastolic heart failure ([place])   . DM2 (diabetes mellitus, type 2) ([place])   . Dyslipidemia   . GERD (gastroesophageal reflux disease)   . History of gout   . Hx of  echocardiogram    a. Echo 3/12: mild LVH, EF 55-60%, trivial AI  . Hypertensive heart disease   . Ischemic cardiomyopathy   . Myocardial infarction [facility]) 1999; 2002; 2003; 2006; ~ 2008  . OSA on CPAP   . Polycystic kidney disease     Assessment: 56 y/o M with chest pain to start heparin per pharmacy, troponin elevated, Hgb 10.3, noted renal dysfunction, PTA meds reviewed.   Heparin level remains above goal despite rate reduction earlier today.  Goal of Therapy:  Heparin level 0.3-0.7 units/ml Monitor platelets by anticoagulation protocol: Yes   Plan:  Reduce heparin drip to 900 units/hr Check heparin level at 4 am. Daily CBC/HL Monitor for bleeding  Uvaldo Rising, BCPS  Clinical Pharmacist Pager [phone]  04/01/2017 9:28 PM

## 2017-04-01 NOTE — Progress Notes (Signed)
[place] for Heparin  Indication: chest pain/ACS  Allergies  Allergen Reactions  . Ciprocin-Fluocin-Procin [Fluocinolone Acetonide] Rash  . Clarithromycin Itching and Other (See Comments)    "Biaxin" Eyes itch and burn  . Cleocin [Clindamycin Hcl] Anaphylaxis and Swelling  . Glimepiride [Amaryl] Other (See Comments)    Elevates liver function   . Colchicine Other (See Comments)    Affected kidneys   Patient Measurements: Height: 5\' 5"  (165.1 cm) Weight: 237 lb (107.5 kg) IBW/kg (Calculated) : 61.5  Vital Signs: Temp: 98.3 F (36.8 C) (07/23 0500) Temp Source: Oral (07/23 0500) BP: 124/73 (07/23 0500) Pulse Rate: 92 (07/23 0500)  Labs:  Recent Labs  03/29/17 1352 03/31/17 2006 04/01/17 0112 04/01/17 0356 04/01/17 0608 04/01/17 1007  HGB  --  10.3*  --  10.2*  --   --   HCT  --  32.3*  --  31.4*  --   --   PLT  --  249  --  241  --   --   HEPARINUNFRC  --   --   --   --   --  0.78*  CREATININE 2.52*  --   --  3.11*  --   --   TROPONINI  --   --  0.65* 1.54* 1.71*  --     Estimated Creatinine Clearance: 29.6 mL/min (A) (by C-G formula based on SCr of 3.11 mg/dL (H)).   Medical History: Past Medical History:  Diagnosis Date  . Anginal pain ([place])   . CKD (chronic kidney disease), stage III   . Coronary artery disease    a. INF MI 1999: PCI of RCA;  b.  extensive stenting of LAD;  c. s/p CABG with RIMA->RCA in 2006;  d. Stable, Low risk MV 05/2012; e. 09/2012 Cath: stable anatomy->med Rx. f. Abnl nuc 11/2013 -> med rx; g. 11/2013 Cath/PCI: LM 30d, LAD 50ost, 20/50isr, 100d, LCX 40-50ost, OM2 50ost, RI 70-80ost sup branch, RCA 20p, 90/60d s/p DES, PDA 60p, RIMA->RCA ok.  d. 11/2016: nuc with EF 35%, no reversible ischem  . Diastolic heart failure ([place])   . DM2 (diabetes mellitus, type 2) ([place])   . Dyslipidemia   . GERD (gastroesophageal reflux disease)   . History of gout   . Hx of echocardiogram    a. Echo 3/12: mild LVH, EF  55-60%, trivial AI  . Hypertensive heart disease   . Ischemic cardiomyopathy   . Myocardial infarction [facility]) 1999; 2002; 2003; 2006; ~ 2008  . OSA on CPAP   . Polycystic kidney disease     Assessment: 56 y/o M with chest pain to start heparin per pharmacy, troponin elevated, Hgb 10.3, noted renal dysfunction, PTA meds reviewed.   Initial heparin level just above goal (0.78), no bleeding issues noted. No plans for cath today given renal function.   Goal of Therapy:  Heparin level 0.3-0.7 units/ml Monitor platelets by anticoagulation protocol: Yes   Plan:  Reduce heparin drip to 1050 units/hr 2000 HL Daily CBC/HL Monitor for bleeding  Erin Hearing PharmD., BCPS Clinical Pharmacist Pager [phone] 04/01/2017 12:09 PM

## 2017-04-01 NOTE — Consult Note (Addendum)
CARDIOLOGY CONSULT NOTE   Referring Physician: Dr. Lita Mains Primary Physician: Gwenlyn Perking Primary Cardiologist: Dr. Harrell Gave End Reason for Consultation: chest pain   HPI: Mr. Scrima is a 56 yo man with PMH CAD s/p CABG and PCI, recurrent angina, hyperlipidemia, CKD stage III, diabetes, chronic systolic and diastolic dysfunction, chronic anemia, sleep apnea who presented with chest pain. He is seen at the request of Dr. Lita Mains for chest pain. Per the patient, he has had chronic angina which has been difficult to control. He recently started prednisone for gout, and shortly after he was given samples of ranolazine to try for his angina. It was after taking both medications that he began noticing sharp epigastric pain. He has had this intermittently for several days, but early this afternoon it was "12/10" which prompted him to come for evaluation. He notes the pain is sharp, nonradiating, worse with deep breathing. He notes that he has also had constant diarrhea for several days, which made the pain worse. No nausea/vomiting. No shortness of breath. No fevers/chills.  In the ER, patient received a total of 8 mg morphine and a GI cocktail with improvement in his symptoms. Troponin negative, comp panel, lipase unremarkable.  Review of Systems:     Cardiac Review of Systems: {Y] = yes [ ]  = no  Chest Pain [  Y  ]  Resting SOB [ N  ] Exertional SOB  [ Y ]  Orthopnea Aqua.Slicker  ]   Pedal Edema [ N  ]    Palpitations [ N ] Syncope  [ N ]   Presyncope [ N  ]  General Review of Systems: [Y] = yes [  ]=no Constitional: recent weight change [  ]; anorexia [  ]; fatigue [  ]; nausea [  ]; night sweats [  ]; fever [  ]; or chills [  ];                                                                     Eyes : blurred vision [  ]; diplopia [   ]; vision changes [  ];  Amaurosis fugax[  ]; Resp: cough [  ];  wheezing[  ];  hemoptysis[  ];  PND [  ];  GI:  gallstones[  ], vomiting[  ];  dysphagia[  ];  melena[  ];  hematochezia [  ]; heartburn[  ];   GU: kidney stones [  ]; hematuria[  ];   dysuria [  ];  nocturia[  ]; incontinence [  ];             Skin: rash, swelling[  ];, hair loss[  ];  peripheral edema[  ];  or itching[  ]; Musculosketetal: myalgias[  ];  joint swelling[  ];  joint erythema[  ];  joint pain[  ];  back pain[  ];  Heme/Lymph: bruising[  ];  bleeding[  ];  anemia[  ];  Neuro: TIA[  ];  headaches[  ];  stroke[  ];  vertigo[  ];  seizures[  ];   paresthesias[  ];  difficulty walking[  ];  Psych:depression[  ]; anxiety[  ];  Endocrine: diabetes[  ];  thyroid dysfunction[  ];  Other:  Past  Medical History:  Diagnosis Date  . Anginal pain ([place])   . CKD (chronic kidney disease), stage III   . Coronary artery disease    a. INF MI 1999: PCI of RCA;  b.  extensive stenting of LAD;  c. s/p CABG with RIMA->RCA in 2006;  d. Stable, Low risk MV 05/2012; e. 09/2012 Cath: stable anatomy->med Rx. f. Abnl nuc 11/2013 -> med rx; g. 11/2013 Cath/PCI: LM 30d, LAD 50ost, 20/50isr, 100d, LCX 40-50ost, OM2 50ost, RI 70-80ost sup branch, RCA 20p, 90/60d s/p DES, PDA 60p, RIMA->RCA ok.  d. 11/2016: nuc with EF 35%, no reversible ischem  . Diastolic heart failure ([place])   . DM2 (diabetes mellitus, type 2) ([place])   . Dyslipidemia   . GERD (gastroesophageal reflux disease)   . History of gout   . Hx of echocardiogram    a. Echo 3/12: mild LVH, EF 55-60%, trivial AI  . Hypertensive heart disease   . Ischemic cardiomyopathy   . Myocardial infarction [facility]) 1999; 2002; 2003; 2006; ~ 2008  . OSA on CPAP   . Polycystic kidney disease     Medications Prior to Admission  Medication Sig Dispense Refill  . acetaminophen (TYLENOL) 500 MG tablet Take 500-1,000 mg by mouth every 6 (six) hours as needed (for pain or headaches).    Marland Kitchen amLODipine (NORVASC) 10 MG tablet Take 1 tablet (10 mg total) by mouth daily. 90 tablet 3  . aspirin 81 MG tablet Take 1 tablet (81 mg total) by mouth daily. (Patient taking  differently: Take 162 mg by mouth 2 (two) times daily. )    . atorvastatin (LIPITOR) 80 MG tablet Take 1 tablet (80 mg total) by mouth daily. 90 tablet 1  . carvedilol (COREG) 25 MG tablet Take 2 tablets (50 mg total) by mouth 2 (two) times daily. 120 tablet 9  . clopidogrel (PLAVIX) 75 MG tablet TAKE ONE TABLET BY MOUTH ONCE DAILY WITH BREAKFAST (Patient taking differently: Take 75 mg by mouth once a day with breakfast) 90 tablet 1  . diphenhydrAMINE (BENADRYL) 25 MG tablet Take 25 mg by mouth every 6 (six) hours as needed for allergies.    . furosemide (LASIX) 40 MG tablet Take 1 tablet (40 mg total) by mouth daily. 90 tablet 1  . gabapentin (NEURONTIN) 300 MG capsule Take 300 mg by mouth 3 (three) times daily.    . hydrALAZINE (APRESOLINE) 50 MG tablet Take 1 tablet (50 mg total) by mouth 3 (three) times daily. 270 tablet 3  . isosorbide mononitrate (IMDUR) 120 MG 24 hr tablet Take 2 tablets (240 mg total) by mouth daily. (Patient taking differently: Take 120 mg by mouth 2 (two) times daily. ) 60 tablet 11  . metFORMIN (GLUCOPHAGE) 1000 MG tablet Take 1,000 mg by mouth 2 (two) times daily with a meal.    . Multiple Vitamin (MULTIVITAMIN WITH MINERALS) TABS tablet Take 1 tablet by mouth daily.    . nitroGLYCERIN (NITROLINGUAL) 0.4 MG/SPRAY spray Place 1 spray under the tongue every 5 (five) minutes x 3 doses as needed for chest pain. 12 g 12  . Omega-3 Fatty Acids (FISH OIL) 1000 MG CAPS Take 2,000 mg by mouth 2 (two) times daily.    . pantoprazole (PROTONIX) 40 MG tablet Take 40 mg by mouth daily.     . predniSONE (DELTASONE) 20 MG tablet Take 10-40 mg by mouth See admin instructions. 40 mg once a day for 3 days then 20 mg once a day for 3 days  then 10 mg once a day for 2 days then d/c    . ranolazine (RANEXA) 500 MG 12 hr tablet Take 1 tablet (500 mg total) by mouth 2 (two) times daily. 180 tablet 3  . vitamin E 400 UNIT capsule Take 400 Units by mouth daily.       Marland Kitchen amLODipine  10 mg Oral  Daily  . aspirin EC  162 mg Oral BID  . atorvastatin  80 mg Oral Daily  . carvedilol  50 mg Oral BID WC  . clopidogrel  75 mg Oral Daily  . enoxaparin (LOVENOX) injection  40 mg Subcutaneous Q24H  . furosemide  40 mg Oral Daily  . gabapentin  300 mg Oral TID  . hydrALAZINE  50 mg Oral TID  . isosorbide mononitrate  120 mg Oral BID  . pantoprazole  40 mg Oral Daily  . prednisoLONE  20 mg Oral Q breakfast   Followed by  . [START ON 04/04/2017] prednisoLONE  10 mg Oral Q breakfast  . ranolazine  500 mg Oral BID    Infusions:   Allergies  Allergen Reactions  . Ciprocin-Fluocin-Procin [Fluocinolone Acetonide] Rash  . Clarithromycin Itching and Other (See Comments)    "Biaxin" Eyes itch and burn  . Cleocin [Clindamycin Hcl] Anaphylaxis and Swelling  . Glimepiride [Amaryl] Other (See Comments)    Elevates liver function   . Colchicine Other (See Comments)    Affected kidneys    Social History   Social History  . Marital status: Married    Spouse name: [name]  . Number of children: 5  . Years of education: N/A   Occupational History  . retired     Chartered loss adjuster   Social History Main Topics  . Smoking status: Former Smoker    Types: Cigarettes  . Smokeless tobacco: Never Used     Comment: smoked some as a teenager (high school) not even a pack a week  . Alcohol use No     Comment: 09/15/2012 "drank a little when I was young"  . Drug use: No  . Sexual activity: Yes   Other Topics Concern  . Not on file   Social History Narrative   Married and lives with wife in [place]. On disability.    Consumes about 2 Coke Zeros a day     Family History  Problem Relation Age of Onset  . Lung cancer Mother   . Anemia Mother   . Polycystic kidney disease Mother   . Diabetes Father   . Heart attack Father 31       Died suddenly  . Heart failure Father   . Hyperlipidemia Father   . Hypertension Father   . Kidney failure Sister 5       Died  . Heart attack Sister   .  Hypertension Sister   . Diabetes Sister   . Diabetes Brother   . Polycystic kidney disease Brother   . Diabetes Sister     PHYSICAL EXAM: Vitals:   04/01/17 0015 04/01/17 0106  BP: (!) 143/82   Pulse:    Resp:    Temp:  98.4 F (36.9 C)     Intake/Output Summary (Last 24 hours) at 04/01/17 0124 Last data filed at 04/01/17 0100  Gross per 24 hour  Intake                0 ml  Output              500 ml  Net             -500 ml    General:  Well appearing. No respiratory difficulty. Sitting on edge of bed, speaking on the phone HEENT: normal Neck: supple. no JVD. Carotids 2+ bilat; no bruits. No lymphadenopathy or thryomegaly appreciated. Cor: PMI nondisplaced. Regular rate & rhythm. No rubs, gallops or murmurs. Lungs: clear Abdomen: soft, tenderness to palpation in lower epigastrium and LUQ without guarding or rebound Extremities: no cyanosis, clubbing, rash, edema Neuro: alert & oriented x 3, cranial nerves grossly intact. moves all 4 extremities w/o difficulty. Affect pleasant.  ECG: sinus tach, similar to ECG 11/16/2016 with slightly wider QRS in left bundle morphology  Results for orders placed or performed during the hospital encounter of 03/31/17 (from the past 24 hour(s))  CBC     Status: Abnormal   Collection Time: 03/31/17  8:06 PM  Result Value Ref Range   WBC 13.5 (H) 4.0 - 10.5 K/uL   RBC 3.73 (L) 4.22 - 5.81 MIL/uL   Hemoglobin 10.3 (L) 13.0 - 17.0 g/dL   HCT 32.3 (L) 39.0 - 52.0 %   MCV 86.6 78.0 - 100.0 fL   MCH 27.6 26.0 - 34.0 pg   MCHC 31.9 30.0 - 36.0 g/dL   RDW 13.6 11.5 - 15.5 %   Platelets 249 150 - 400 K/uL  Hepatic function panel     Status: Abnormal   Collection Time: 03/31/17  8:06 PM  Result Value Ref Range   Total Protein 7.1 6.5 - 8.1 g/dL   Albumin 3.9 3.5 - 5.0 g/dL   AST 30 15 - 41 U/L   ALT 19 17 - 63 U/L   Alkaline Phosphatase 61 38 - 126 U/L   Total Bilirubin 0.5 0.3 - 1.2 mg/dL   Bilirubin, Direct <0.1 (L) 0.1 - 0.5 mg/dL    Indirect Bilirubin NOT CALCULATED 0.3 - 0.9 mg/dL  Lipase, blood     Status: None   Collection Time: 03/31/17  8:06 PM  Result Value Ref Range   Lipase 40 11 - 51 U/L  I-stat troponin, ED     Status: None   Collection Time: 03/31/17  8:13 PM  Result Value Ref Range   Troponin i, poc 0.03 0.00 - 0.08 ng/mL   Comment 3          Urinalysis, Routine w reflex microscopic     Status: Abnormal   Collection Time: 03/31/17  8:14 PM  Result Value Ref Range   Color, Urine YELLOW YELLOW   APPearance CLEAR CLEAR   Specific Gravity, Urine 1.015 1.005 - 1.030   pH 5.0 5.0 - 8.0   Glucose, UA NEGATIVE NEGATIVE mg/dL   Hgb urine dipstick NEGATIVE NEGATIVE   Bilirubin Urine NEGATIVE NEGATIVE   Ketones, ur NEGATIVE NEGATIVE mg/dL   Protein, ur 100 (A) NEGATIVE mg/dL   Nitrite NEGATIVE NEGATIVE   Leukocytes, UA NEGATIVE NEGATIVE   RBC / HPF NONE SEEN 0 - 5 RBC/hpf   WBC, UA NONE SEEN 0 - 5 WBC/hpf   Bacteria, UA NONE SEEN NONE SEEN   Squamous Epithelial / LPF NONE SEEN NONE SEEN   Mucous PRESENT    Dg Chest Port 1 View  Result Date: 03/31/2017 CLINICAL DATA:  Chest pain for several days EXAM: PORTABLE CHEST 1 VIEW COMPARISON:  02/07/2017 FINDINGS: Post sternotomy changes with fracture through fourth sternal wire as before. Mild cardiomegaly with mild central congestion. No focal consolidation or effusion. No pneumothorax. IMPRESSION: Mild cardiomegaly  with central congestion.  No edema or infiltrate. Electronically Signed   By: Donavan Foil M.D.   On: 03/31/2017 20:34   ASSESSMENT/PLAN: Mr. Cahalan is a 56 yo man with PMH CAD s/p CABG and PCI, recurrent angina, hyperlipidemia, CKD stage III, diabetes, chronic systolic and diastolic dysfunction, chronic anemia, sleep apnea who presented with chest pain. He is seen at the request of Dr. Lita Mains for chest pain.  Chest pain has some atypical features, including tenderness to palpation. However, he reports that it is similar to his prior angina. Given  his history, would rule out with troponin x3.   ADDENDUM: Second troponin returned elevated. Based on this and his prior CAD history, may warrant cath in AM. Would keep NPO after midnight. Given his CKD, this will be a risk-benefit discussion.  Would start heparin given positive troponin. Nitroglycerin for chest pain control.  Continue home medications amlodipine, aspirin, clopidogrel, hydralazine, imdur, carvedilol.  Please call overnight with any questions or concerns.  Buford Dresser, MD, PhD, overnight cardiology provider

## 2017-04-01 NOTE — Progress Notes (Signed)
Progress Note  Patient Name: Matthew HUDLER Sr. Date of Encounter: 04/01/2017  Primary Cardiologist: END  Subjective   No chest pain or abd pain, no SOB,  Pt believes this is all due to prednisone.  Wants to eat.  Inpatient Medications    Scheduled Meds: . amLODipine  10 mg Oral Daily  . aspirin EC  325 mg Oral Daily  . atorvastatin  80 mg Oral Daily  . carvedilol  50 mg Oral BID WC  . clopidogrel  75 mg Oral Daily  . furosemide  40 mg Oral Daily  . gabapentin  300 mg Oral TID  . hydrALAZINE  50 mg Oral TID  . insulin aspart  0-15 Units Subcutaneous TID WC  . isosorbide mononitrate  120 mg Oral BID  . pantoprazole  40 mg Oral Daily   Continuous Infusions: . heparin 1,200 Units/hr (04/01/17 0323)   PRN Meds: acetaminophen, diphenhydrAMINE, gi cocktail, morphine injection, nitroGLYCERIN, ondansetron (ZOFRAN) IV   Vital Signs    Vitals:   04/01/17 0015 04/01/17 0106 04/01/17 0136 04/01/17 0500  BP: (!) 143/82 (!) 149/82 (!) 141/78 124/73  Pulse:  89 85 92  Resp:  19 (!) 26 (!) 26  Temp:  98.4 F (36.9 C)  98.3 F (36.8 C)  TempSrc:  Oral  Oral  SpO2: 100% 98% 94% 95%  Weight:      Height:        Intake/Output Summary (Last 24 hours) at 04/01/17 0735 Last data filed at 04/01/17 0600  Gross per 24 hour  Intake           378.15 ml  Output             1025 ml  Net          -646.85 ml   Filed Weights   03/31/17 1953  Weight: 237 lb (107.5 kg)    Telemetry    SR - Personally Reviewed  ECG    SR LBBB yesterday  - Personally Reviewed  Physical Exam   GEN: No acute distress.  Obese Neck: No JVD Cardiac: RRR, no murmurs, rubs, or gallops.  Respiratory: Clear to auscultation bilaterally. GI: Soft, nontender, non-distended + BS MS: No edema; No deformity.   Neuro:  Nonfocal  Psych: Normal affect   Labs    Chemistry Recent Labs Lab 03/29/17 1352 03/31/17 2006 04/01/17 0356  NA 140  --  141  K 4.9  --  6.1*  CL 106  --  109  CO2 17*  --   17*  GLUCOSE 133*  --  102*  BUN 35*  --  46*  CREATININE 2.52*  --  3.11*  CALCIUM 9.2  --  9.1  PROT  --  7.1  --   ALBUMIN  --  3.9  --   AST  --  30  --   ALT  --  19  --   ALKPHOS  --  61  --   BILITOT  --  0.5  --   GFRNONAA 27*  --  21*  GFRAA 31*  --  24*  ANIONGAP  --   --  15     Hematology Recent Labs Lab 03/31/17 2006 04/01/17 0356  WBC 13.5* 13.1*  RBC 3.73* 3.62*  HGB 10.3* 10.2*  HCT 32.3* 31.4*  MCV 86.6 86.7  MCH 27.6 28.2  MCHC 31.9 32.5  RDW 13.6 14.1  PLT 249 241    Cardiac Enzymes Recent Labs Lab 04/01/17  0112 04/01/17 0356 04/01/17 0608  TROPONINI 0.65* 1.54* 1.71*    Recent Labs Lab 03/31/17 2013  TROPIPOC 0.03     BNPNo results for input(s): BNP, PROBNP in the last 168 hours.   DDimer No results for input(s): DDIMER in the last 168 hours.   Radiology    Dg Chest Port 1 View  Result Date: 03/31/2017 CLINICAL DATA:  Chest pain for several days EXAM: PORTABLE CHEST 1 VIEW COMPARISON:  02/07/2017 FINDINGS: Post sternotomy changes with fracture through fourth sternal wire as before. Mild cardiomegaly with mild central congestion. No focal consolidation or effusion. No pneumothorax. IMPRESSION: Mild cardiomegaly with central congestion.  No edema or infiltrate. Electronically Signed   By: Donavan Foil M.D.   On: 03/31/2017 20:34    Cardiac Studies   Cath 11/2013  IMPRESSIONS: Cardiac Cath:  OM 30%. LAD multiple stents. 50% ostial, 20% diffuse proximal ISR, Diffuse 50% ISR mid and distal, Distal edema 100% occluded; 3 very small diagonals. Small circumflex -  40-50% ostial stenosis, Small OM1, moderate size OM 2 and OM 3. 50% ostial OM 2. Ramus intermedius: Large-caliber with 2 branches, superior branch had ostial 70-80% stenosis.  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   RIMA-RCA patent, but insertion is proximal to stenosis site.   Staged PCI via femoral approach - use CLS 3.5 guiding catheter  1. Successful PCI of the mid to distal right coronary artery with overlapping Promus drug-eluting stents, 2.25 x 24 and 2.25 x 16, postdilated to greater than 2.5 mm in diameter. 2.  LVEDP 26 mmHg. elevated LVEDP, likely related to aggressive hydration post catheterization.  Watch for post procedure shortness of breath again tomorrow since we will hydrate him aggressively after this dye load. 3.   AL2 or CLS 3.5 Guide is suitable for engaging the RCA.  CLS 3.5 was used today since we only had AL2 with sideholes in the cath lab.    ECHO 12/2016  Moderate concentric LVH. EF 45-50%. Tear, distal anteroseptal, apical and distal inferoapical akinesis suggestive of LAD infarct. No thrombus. Mild aortic stenosis with no regurgitation. Mean gradient 14 mmHg. Moderate LA dilation.-   Compared to a prior study in 2016, the LVEF is lower at 45-50%   with LAD territory wall motion abnormalities. There is mild   calcific aortic valve stenosis.   Patient Profile     56 y.o. 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  Increase of Cr 3.11 and troponin 1.71  ( has not been able to obtain ranexa yet, [facility])  Assessment & Plan    Chest pain, unstable angina/NSTEMI though may have a GI component.  NTG SL without relief.  Continue IV heparin pain free this AM.   Agree with IV fluids,  Would ask renal to see, may ned cath but will hold today for K+ of 6.1 --medical management with coreg 50 BID, imdur 120 BID-- ranexa currently not ordered.  Also on asa and plavix.  ? Cath MD  to see.   CAD with CABG in 2006 and PCI since  that time.  Last cath 02/5680  Chronic diastolic HF  With EF 27-51%, moderate concentric hypertrophy.  Acute on chronic renal failure.per IM probable renal consult  HLD  On lipitor 80  DM-2 on metformin at home per IM on SSI  HTN elevated on admit now improved.  abd pain as well on admit on prednisone.  .   Gout on prednisone  OSA  cpap in room pt uses.   Hyperkalemia per IM   Chronic anemia per IM   Signed, Cecilie Kicks, NP  04/01/2017, 7:35 AM     I have seen, examined and evaluated the patient this aM along with Cecilie Kicks, NP .  After reviewing all the available data and chart, we discussed the patients laboratory, study & physical findings as well as symptoms in detail. I agree with HER findings, examination as well as impression recommendations as per our discussion.    Attending adjustments noted in italics.   Rolen presents with patient on recommendation of symptoms. He felt like his symptoms were GI related since they were relieved with GI cocktail. However we have a patient who is on high doses of antianginal medications recently started on Ranexa (on Friday) for stable angina who now presents with epigastric pain similar to his anginal equivalent and now has positive troponins. I have a hard time not considering this to be non-ST elevation MI. It could very well be related to his gout flare, but I don't think that prednisone and agreed with it.  Principal Problem:   Non-ST elevation (NSTEMI) myocardial infarction [facility]) Active Problems:   CAD- CABG 2006, RCA DES March 2015   Chronic combined systolic and diastolic heart failure (HCC)   Essential hypertension   Obesity-BMI 42   Type 2 diabetes mellitus with renal manifestations (HCC)   Hyperlipidemia LDL goal <70   Stage 3 chronic kidney disease   Hx of CABG   Hyperkalemia   OSA on CPAP   Precordial chest pain  The concern here is that I think the only way we  eventually figure out why he has such intractable angina is to proceed with cardiac catheterization in setting of positive troponins. The trouble is his creatinine is gone from 2.5-3.1 with a potassium now of 6.1. [facility], and I would prefer to have their input based on their knowledge of his progression of renal disease to determine where we stand as far as potentially considering cardiac catheterization was staged PCI.  He is already on maximal antianginal therapy with 120 mg daily of Imdur, 50mg  BID Carvedilol & 10 mg Amlodiine twice a day of carvedilol and now added Ranexa which we cannot titrate beyond 500 twice a day. He is also on standing dose of furosemide 40 mg twice a day, but does not have signs of volume overload.  On Max dose Atorvastatin as well as ASA/Plavix.  We will allow him to eat today and ask for nephrology consultation. Tentatively make him nothing by mouth after breakfast tomorrow for possible diagnostic catheterization if his renal function is stabilized.     Glenetta Hew, M.D., M.S. Interventional Cardiologist   Pager # [phone] Phone # [phone] [address] [place], [place] [address]

## 2017-04-01 NOTE — Progress Notes (Signed)
Upon entering room patient had already placed self on CPAP for the night. He is resting comfortably at this time.

## 2017-04-01 NOTE — Progress Notes (Signed)
ANTICOAGULATION CONSULT NOTE - Initial Consult  Pharmacy Consult for Heparin  Indication: chest pain/ACS  Allergies  Allergen Reactions  . Ciprocin-Fluocin-Procin [Fluocinolone Acetonide] Rash  . Clarithromycin Itching and Other (See Comments)    "Biaxin" Eyes itch and burn  . Cleocin [Clindamycin Hcl] Anaphylaxis and Swelling  . Glimepiride [Amaryl] Other (See Comments)    Elevates liver function   . Colchicine Other (See Comments)    Affected kidneys   Patient Measurements: Height: 5\' 5"  (165.1 cm) Weight: 237 lb (107.5 kg) IBW/kg (Calculated) : 61.5  Vital Signs: Temp: 98.4 F (36.9 C) (07/23 0106) Temp Source: Oral (07/23 0106) BP: 141/78 (07/23 0136) Pulse Rate: 85 (07/23 0136)  Labs:  Recent Labs  03/29/17 1352 03/31/17 2006 04/01/17 0112  HGB  --  10.3*  --   HCT  --  32.3*  --   PLT  --  249  --   CREATININE 2.52*  --   --   TROPONINI  --   --  0.65*    Estimated Creatinine Clearance: 36.6 mL/min (A) (by C-G formula based on SCr of 2.52 mg/dL (H)).   Medical History: Past Medical History:  Diagnosis Date  . Anginal pain ([place])   . CKD (chronic kidney disease), stage III   . Coronary artery disease    a. INF MI 1999: PCI of RCA;  b.  extensive stenting of LAD;  c. s/p CABG with RIMA->RCA in 2006;  d. Stable, Low risk MV 05/2012; e. 09/2012 Cath: stable anatomy->med Rx. f. Abnl nuc 11/2013 -> med rx; g. 11/2013 Cath/PCI: LM 30d, LAD 50ost, 20/50isr, 100d, LCX 40-50ost, OM2 50ost, RI 70-80ost sup branch, RCA 20p, 90/60d s/p DES, PDA 60p, RIMA->RCA ok.  d. 11/2016: nuc with EF 35%, no reversible ischem  . Diastolic heart failure ([place])   . DM2 (diabetes mellitus, type 2) ([place])   . Dyslipidemia   . GERD (gastroesophageal reflux disease)   . History of gout   . Hx of echocardiogram    a. Echo 3/12: mild LVH, EF 55-60%, trivial AI  . Hypertensive heart disease   . Ischemic cardiomyopathy   . Myocardial infarction [facility]) 1999; 2002; 2003; 2006; ~ 2008  . OSA on  CPAP   . Polycystic kidney disease     Assessment: 55 y/o M with chest pain to start heparin per pharmacy, troponin elevated, Hgb 10.3, noted renal dysfunction, PTA meds reviewed.   Goal of Therapy:  Heparin level 0.3-0.7 units/ml Monitor platelets by anticoagulation protocol: Yes   Plan:  Heparin 4000 units BOLUS Start heparin drip at 1200 units/hr 1100 HL Daily CBC/HL Monitor for bleeding   Narda Bonds 04/01/2017,3:06 AM

## 2017-04-01 NOTE — Progress Notes (Addendum)
PROGRESS NOTE    Matthew Rowe  QQI:[id number] DOB: 1960/12/26 DOA: 03/31/2017 PCP: Patient, No Pcp Per   Outpatient Specialists:     Brief Narrative:  Matthew Heslin. is a 56 y.o. male with medical history significant of CAD s/p PCI, CHF, CKD stage III, DM type 2, gout; who presents with complaints of chest pain. Patient reports complaints of chest pain off and on over the last several days. However yesterday morning symptoms started and progressively worsened. He reports having left-sided chest pain that he describes as sharp and pressure-like that radiated to the right side of his chest, back, neck, and stomach. Pain worse with deep inspiration. Patient had just been seen by his cardiologist Dr. Harrell Gave End, 2 days ago. It appears he was restarted back on Ranexa at that time, which have been stopped due to insurance not covering it. The following day he was seen in his primary care office for acute pain and swelling of his right forefoot foot. Prior to being seen patient had been drinking lots of cherry juice as he had heard that this helps relieve symptoms. He was placed on a prednisone taper and reports that symptoms have resolved. Today when symptoms started patient tried taking nitroglycerin without relief of symptoms. He reports taking the Ranexa and prednisone recently prescribed. Throughout the day it appears the patient was taking additional medication than what is prescribed. Patient apparently took up to 3500 mg of metformin to self treat for elevated blood sugars and additional 500 mg of Ranexa due to chest pain symptoms. Denies having any change in weight, diaphoresis, nausea, vomiting, loss of consciousness, or lightheadedness feelings.   Assessment & Plan:   Principal Problem:   Non-ST elevation (NSTEMI) myocardial infarction [facility]) Active Problems:   Essential hypertension   OSA on CPAP   Obesity-BMI 42   Type 2 diabetes mellitus with renal manifestations  ([place])   Precordial chest pain   CAD- CABG 2006, RCA DES March 2015   Hyperlipidemia LDL goal <70   Stage 3 chronic kidney disease   Chronic combined systolic and diastolic heart failure (HCC)   Hx of CABG   Hyperkalemia   NSTEMI -possible plan for cath in AM if Cr stable -imdur/coreg/amlodipine -ASA/plavix -heparin gtt currently  Hyperkalemia -recheck and treat -no mention of hemolysis  Chronic systolic and diastolic CHF: Last echocardiogram showed EF of 45-50% with grade 1 diastolic dysfunction in 05/4173. Patient appears to be fluid overloaded at this time. - Continue Hydralazine, isosorbide mononitrate, Lasix  Coronary artery disease s/p PCI and CABG - Continue Plavix   Essential hypertension - Continue medications as seen above  Steroid-induced hyperglycemia in diabetes mellitus type 2: Patient only on metformin at home. Reports having blood sugars up as high as 360 due to recently starting steroids for gout flare. Last hemoglobin A1c on file was 6.9 and 03/2016. - Hypoglycemic protocols - Hold metformin  - CBGs before meals with SSI  Chronic kidney disease stage III: Stable. Baseline Cr 2.14-2.81.  -nephrology consult for possible cath in AM  Obesity: BMI 42  Gout flare: Now resolved. Patient took 2 doses of prednisone with reported resolution of symptoms. - Discontinued steroid tapered due to patient reported hyperglycemia    DVT prophylaxis:  Fully anticoagulated   Code Status: Full Code   Family Communication:   Disposition Plan:    Consultants:   Cards  nephrology     Subjective: Sleeping- on bipap  Objective: Vitals:   04/01/17 0015 04/01/17  0106 04/01/17 0136 04/01/17 0500  BP: (!) 143/82 (!) 149/82 (!) 141/78 124/73  Pulse:  89 85 92  Resp:  19 (!) 26 (!) 26  Temp:  98.4 F (36.9 C)  98.3 F (36.8 C)  TempSrc:  Oral  Oral  SpO2: 100% 98% 94% 95%  Weight:      Height:        Intake/Output Summary (Last 24 hours) at  04/01/17 1310 Last data filed at 04/01/17 1000  Gross per 24 hour  Intake           378.15 ml  Output             1275 ml  Net          -896.85 ml   Filed Weights   03/31/17 1953  Weight: 107.5 kg (237 lb)    Examination:  General exam: resting with bipap on Respiratory system: Clear to auscultation. Respiratory effort normal. Cardiovascular system: S1 & S2 heard, RRR. No JVD, murmurs, rubs, gallops or clicks. No pedal edema. Gastrointestinal system: Abdomen is nondistended, soft and nontender. No organomegaly or masses felt. Normal bowel sounds heard. Central nervous system: sleeping Extremities: Symmetric 5 x 5 power. Skin: No rashes, lesions or ulcers Psychiatry: Judgement and insight appear normal. Mood & affect appropriate.     Data Reviewed: I have personally reviewed following labs and imaging studies  CBC:  Recent Labs Lab 03/31/17 2006 04/01/17 0356  WBC 13.5* 13.1*  NEUTROABS  --  10.4*  HGB 10.3* 10.2*  HCT 32.3* 31.4*  MCV 86.6 86.7  PLT 249 742   Basic Metabolic Panel:  Recent Labs Lab 03/29/17 1352 04/01/17 0356  NA 140 141  K 4.9 6.1*  CL 106 109  CO2 17* 17*  GLUCOSE 133* 102*  BUN 35* 46*  CREATININE 2.52* 3.11*  CALCIUM 9.2 9.1   GFR: Estimated Creatinine Clearance: 29.6 mL/min (A) (by C-G formula based on SCr of 3.11 mg/dL (H)). Liver Function Tests:  Recent Labs Lab 03/31/17 2006  AST 30  ALT 19  ALKPHOS 61  BILITOT 0.5  PROT 7.1  ALBUMIN 3.9    Recent Labs Lab 03/31/17 2006  LIPASE 40   No results for input(s): AMMONIA in the last 168 hours. Coagulation Profile: No results for input(s): INR, PROTIME in the last 168 hours. Cardiac Enzymes:  Recent Labs Lab 04/01/17 0112 04/01/17 0356 04/01/17 0608  TROPONINI 0.65* 1.54* 1.71*   BNP (last 3 results) No results for input(s): PROBNP in the last 8760 hours. HbA1C: No results for input(s): HGBA1C in the last 72 hours. CBG:  Recent Labs Lab 04/01/17 0800  04/01/17 1207  GLUCAP 98 121*   Lipid Profile:  Recent Labs  04/01/17 0356  CHOL 146  HDL 29*  LDLCALC 94  TRIG 116  CHOLHDL 5.0   Thyroid Function Tests: No results for input(s): TSH, T4TOTAL, FREET4, T3FREE, THYROIDAB in the last 72 hours. Anemia Panel: No results for input(s): VITAMINB12, FOLATE, FERRITIN, TIBC, IRON, RETICCTPCT in the last 72 hours. Urine analysis:    Component Value Date/Time   COLORURINE YELLOW 03/31/2017 2014   APPEARANCEUR CLEAR 03/31/2017 2014   LABSPEC 1.015 03/31/2017 2014   PHURINE 5.0 03/31/2017 2014   GLUCOSEU NEGATIVE 03/31/2017 2014   HGBUR NEGATIVE 03/31/2017 2014   [place] NEGATIVE 03/31/2017 2014   [place] 03/31/2017 2014   PROTEINUR 100 (A) 03/31/2017 2014   UROBILINOGEN 1.0 07/17/2015 1425   NITRITE NEGATIVE 03/31/2017 2014   LEUKOCYTESUR NEGATIVE 03/31/2017  2014   Sepsis Labs: @LABRCNTIP (procalcitonin:4,lacticidven:4)  ) Recent Results (from the past 240 hour(s))  MRSA PCR Screening     Status: None   Collection Time: 04/01/17  1:41 AM  Result Value Ref Range Status   MRSA by PCR NEGATIVE NEGATIVE Final    Comment:        The GeneXpert MRSA Assay (FDA approved for NASAL specimens only), is one component of a comprehensive MRSA colonization surveillance program. It is not intended to diagnose MRSA infection nor to guide or monitor treatment for MRSA infections.       Anti-infectives    None       Radiology Studies: Dg Chest Port 1 View  Result Date: 03/31/2017 CLINICAL DATA:  Chest pain for several days EXAM: PORTABLE CHEST 1 VIEW COMPARISON:  02/07/2017 FINDINGS: Post sternotomy changes with fracture through fourth sternal wire as before. Mild cardiomegaly with mild central congestion. No focal consolidation or effusion. No pneumothorax. IMPRESSION: Mild cardiomegaly with central congestion.  No edema or infiltrate. Electronically Signed   By: Donavan Foil M.D.   On: 03/31/2017 20:34         Scheduled Meds: . amLODipine  10 mg Oral Daily  . aspirin EC  325 mg Oral Daily  . atorvastatin  80 mg Oral Daily  . carvedilol  50 mg Oral BID WC  . clopidogrel  75 mg Oral Daily  . furosemide  40 mg Oral Daily  . gabapentin  300 mg Oral TID  . hydrALAZINE  50 mg Oral TID  . insulin aspart  0-15 Units Subcutaneous TID WC  . isosorbide mononitrate  120 mg Oral BID  . pantoprazole  40 mg Oral Daily  . ranolazine  500 mg Oral BID   Continuous Infusions: . heparin 1,050 Units/hr (04/01/17 1254)     LOS: 0 days    Time spent: 35 min    [place], DO [facility] Pager [phone]  If 7PM-7AM, please contact night-coverage [email or web address] Password [email or web address] 04/01/2017, 1:10 PM

## 2017-04-02 ENCOUNTER — Encounter (HOSPITAL_COMMUNITY)
Admission: EM | Disposition: A | Discharge status: Home / Self Care | Payer: Self-pay | Source: Home / Self Care | Attending: Internal Medicine

## 2017-04-02 DIAGNOSIS — Z6841 Body Mass Index (BMI) 40.0 and over, adult: Secondary | ICD-10-CM

## 2017-04-02 DIAGNOSIS — G4733 Obstructive sleep apnea (adult) (pediatric): Secondary | ICD-10-CM

## 2017-04-02 DIAGNOSIS — Z9989 Dependence on other enabling machines and devices: Secondary | ICD-10-CM

## 2017-04-02 DIAGNOSIS — R079 Chest pain, unspecified: Secondary | ICD-10-CM

## 2017-04-02 LAB — CBC
HEMATOCRIT: 32.9 % — AB (ref 39.0–52.0)
HEMOGLOBIN: 10.4 g/dL — AB (ref 13.0–17.0)
MCH: 26.9 pg (ref 26.0–34.0)
MCHC: 31.6 g/dL (ref 30.0–36.0)
MCV: 85.2 fL (ref 78.0–100.0)
Platelets: 240 10*3/uL (ref 150–400)
RBC: 3.86 MIL/uL — AB (ref 4.22–5.81)
RDW: 13.6 % (ref 11.5–15.5)
WBC: 8.7 10*3/uL (ref 4.0–10.5)

## 2017-04-02 LAB — HEPARIN LEVEL (UNFRACTIONATED)
HEPARIN UNFRACTIONATED: 0.6 [IU]/mL (ref 0.30–0.70)
Heparin Unfractionated: 0.67 IU/mL (ref 0.30–0.70)

## 2017-04-02 LAB — BASIC METABOLIC PANEL
Anion gap: 8 (ref 5–15)
BUN: 52 mg/dL — AB (ref 6–20)
CHLORIDE: 110 mmol/L (ref 101–111)
CO2: 19 mmol/L — AB (ref 22–32)
Calcium: 9.1 mg/dL (ref 8.9–10.3)
Creatinine, Ser: 2.94 mg/dL — ABNORMAL HIGH (ref 0.61–1.24)
GFR calc Af Amer: 26 mL/min — ABNORMAL LOW (ref >60)
GFR calc non Af Amer: 22 mL/min — ABNORMAL LOW (ref >60)
Glucose, Bld: 123 mg/dL — ABNORMAL HIGH (ref 65–99)
POTASSIUM: 4.6 mmol/L (ref 3.5–5.1)
SODIUM: 137 mmol/L (ref 135–145)

## 2017-04-02 LAB — GLUCOSE, CAPILLARY
Glucose-Capillary: 128 mg/dL — ABNORMAL HIGH (ref 65–99)
Glucose-Capillary: 191 mg/dL — ABNORMAL HIGH (ref 65–99)
Glucose-Capillary: 190 mg/dL — ABNORMAL HIGH (ref 65–99)
Glucose-Capillary: 141 mg/dL — ABNORMAL HIGH (ref 65–99)

## 2017-04-02 SURGERY — LEFT HEART CATH AND CORONARY ANGIOGRAPHY
Anesthesia: LOCAL

## 2017-04-02 MED ORDER — HEPARIN (PORCINE) IN NACL 100-0.45 UNIT/ML-% IJ SOLN
800.0000 [IU]/h | INTRAMUSCULAR | Status: DC
  Administered 2017-04-03: 800 [IU]/h via INTRAVENOUS
  Filled 2017-04-02: qty 250

## 2017-04-02 NOTE — Telephone Encounter (Signed)
Thank you for the update. I see that he is admitted and being followed by cardiology.  Nelva Bush, MD [facility] Pager: [phone]

## 2017-04-02 NOTE — Progress Notes (Signed)
[place] for Heparin  Indication: chest pain/ACS  Allergies  Allergen Reactions  . Ciprocin-Fluocin-Procin [Fluocinolone Acetonide] Rash  . Clarithromycin Itching and Other (See Comments)    "Biaxin" Eyes itch and burn  . Cleocin [Clindamycin Hcl] Anaphylaxis and Swelling  . Glimepiride [Amaryl] Other (See Comments)    Elevates liver function   . Colchicine Other (See Comments)    Affected kidneys   Patient Measurements: Height: 5\' 5"  (165.1 cm) Weight: 229 lb 14.4 oz (104.3 kg) IBW/kg (Calculated) : 61.5  Vital Signs: Temp: 98.2 F (36.8 C) (07/24 1300) Temp Source: Oral (07/24 1300) BP: 128/70 (07/24 1300) Pulse Rate: 83 (07/24 1300)  Labs:  Recent Labs  03/31/17 2006 04/01/17 0112 04/01/17 0356 04/01/17 0608  04/01/17 1353 04/01/17 1949 04/02/17 0412 04/02/17 1210  HGB 10.3*  --  10.2*  --   --   --   --  10.4*  --   HCT 32.3*  --  31.4*  --   --   --   --  32.9*  --   PLT 249  --  241  --   --   --   --  240  --   HEPARINUNFRC  --   --   --   --   < >  --  0.78* 0.60 0.67  CREATININE  --   --  3.11*  --   --  3.13*  --  2.94*  --   TROPONINI  --  0.65* 1.54* 1.71*  --   --   --   --   --   < > = values in this interval not displayed.  Estimated Creatinine Clearance: 30.8 mL/min (A) (by C-G formula based on SCr of 2.94 mg/dL (H)).    Assessment: 56 YOM continues on IV heparin for chest pain.  Patient is hesitant to proceed with cath so the current plan is to continue heparin for 72 hours and then reassess.  Heparin level is therapeutic and toward the high end of normal.  No bleeding reported.   Goal of Therapy:  Heparin level 0.3-0.7 units/ml Monitor platelets by anticoagulation protocol: Yes     Plan:  Reduce heparin gtt to 800 units/hr Daily heparin level and CBC F/U with plan tomorrow    Karsin Pesta D. Mina Marble, PharmD, BCPS Pager:  [phone] 04/02/2017, 1:24 PM

## 2017-04-02 NOTE — Progress Notes (Signed)
[place] for Heparin  Indication: chest pain/ACS  Allergies  Allergen Reactions  . Ciprocin-Fluocin-Procin [Fluocinolone Acetonide] Rash  . Clarithromycin Itching and Other (See Comments)    "Biaxin" Eyes itch and burn  . Cleocin [Clindamycin Hcl] Anaphylaxis and Swelling  . Glimepiride [Amaryl] Other (See Comments)    Elevates liver function   . Colchicine Other (See Comments)    Affected kidneys   Patient Measurements: Height: 5\' 5"  (165.1 cm) Weight: 237 lb (107.5 kg) IBW/kg (Calculated) : 61.5  Vital Signs: Temp: 97.7 F (36.5 C) (07/23 2105) Temp Source: Oral (07/23 2105) BP: 116/62 (07/23 2105)  Labs:  Recent Labs  03/31/17 2006 04/01/17 0112 04/01/17 0356 04/01/17 0608 04/01/17 1007 04/01/17 1353 04/01/17 1949 04/02/17 0412  HGB 10.3*  --  10.2*  --   --   --   --  10.4*  HCT 32.3*  --  31.4*  --   --   --   --  32.9*  PLT 249  --  241  --   --   --   --  240  HEPARINUNFRC  --   --   --   --  0.78*  --  0.78* 0.60  CREATININE  --   --  3.11*  --   --  3.13*  --   --   TROPONINI  --  0.65* 1.54* 1.71*  --   --   --   --     Estimated Creatinine Clearance: 29.4 mL/min (A) (by C-G formula based on SCr of 3.13 mg/dL (H)).   Medical History: Past Medical History:  Diagnosis Date  . Anginal pain ([place])   . CKD (chronic kidney disease), stage III   . Coronary artery disease    a. INF MI 1999: PCI of RCA;  b.  extensive stenting of LAD;  c. s/p CABG with RIMA->RCA in 2006;  d. Stable, Low risk MV 05/2012; e. 09/2012 Cath: stable anatomy->med Rx. f. Abnl nuc 11/2013 -> med rx; g. 11/2013 Cath/PCI: LM 30d, LAD 50ost, 20/50isr, 100d, LCX 40-50ost, OM2 50ost, RI 70-80ost sup branch, RCA 20p, 90/60d s/p DES, PDA 60p, RIMA->RCA ok.  d. 11/2016: nuc with EF 35%, no reversible ischem  . Diastolic heart failure ([place])   . DM2 (diabetes mellitus, type 2) ([place])   . Dyslipidemia   . GERD (gastroesophageal reflux disease)   . History of  gout   . Hx of echocardiogram    a. Echo 3/12: mild LVH, EF 55-60%, trivial AI  . Hypertensive heart disease   . Ischemic cardiomyopathy   . Myocardial infarction [facility]) 1999; 2002; 2003; 2006; ~ 2008  . OSA on CPAP   . Polycystic kidney disease    Assessment: 56 y/o M with chest pain on heparin per pharmacy, troponin elevated, Hgb 10.4, noted renal dysfunction, heparin level therapeutic x 1 after rate decrease.    Goal of Therapy:  Heparin level 0.3-0.7 units/ml Monitor platelets by anticoagulation protocol: Yes    Plan:  Cont heparin drip at 900 units/hr 1200 HL Daily CBC/HL Monitor for bleeding   Narda Bonds 04/02/2017,4:53 AM

## 2017-04-02 NOTE — Progress Notes (Signed)
Progress Note  Patient Name: Matthew SADA Sr. Date of Encounter: 04/02/2017  Primary Cardiologist: END  Subjective   No further CP or Dyspnea.  Has not walked beyond the bathroom door.  Inpatient Medications    Scheduled Meds: . amLODipine  10 mg Oral Daily  . aspirin EC  325 mg Oral Daily  . atorvastatin  80 mg Oral Daily  . carvedilol  50 mg Oral BID WC  . clopidogrel  75 mg Oral Daily  . furosemide  40 mg Oral Daily  . gabapentin  300 mg Oral BID  . hydrALAZINE  50 mg Oral TID  . insulin aspart  0-15 Units Subcutaneous TID WC  . isosorbide mononitrate  120 mg Oral BID  . pantoprazole  40 mg Oral Daily  . ranolazine  500 mg Oral BID   Continuous Infusions: . heparin 900 Units/hr (04/01/17 2130)   PRN Meds: acetaminophen, diphenhydrAMINE, gi cocktail, morphine injection, nitroGLYCERIN, ondansetron (ZOFRAN) IV   Vital Signs    Vitals:   04/01/17 1319 04/01/17 2105 04/02/17 0500 04/02/17 1014  BP: 126/70 116/62 129/77 131/70  Pulse: 88  82 77  Resp:  18 18   Temp: (!) 97.5 F (36.4 C) 97.7 F (36.5 C) 98.5 F (36.9 C) 98.5 F (36.9 C)  TempSrc: Oral Oral Oral Oral  SpO2: 97% 98%    Weight:   229 lb 14.4 oz (104.3 kg)   Height:        Intake/Output Summary (Last 24 hours) at 04/02/17 1023 Last data filed at 04/02/17 0700  Gross per 24 hour  Intake           137.85 ml  Output             1200 ml  Net         -1062.15 ml   Filed Weights   03/31/17 1953 04/02/17 0500  Weight: 237 lb (107.5 kg) 229 lb 14.4 oz (104.3 kg)    Telemetry    NSR with rare PVCs - Personally Reviewed  ECG    No new- Personally Reviewed  Physical Exam   GEN: Obese, A&O x 3.  Well groomed.   Neck: supple, no LAD, JVD or Carotid bruit Cardiac: RRR, Normal S1 and S2 and nondisplaced PMI. No M/R/G. Respiratory:  CTA B, nonlabored, good air movement GI:  soft/NT/ND/ NABS. No HSM. MS:  no C/C/E Neuro:   nonfocal. CN II-12 grossly intact Psych: Normal/pleasant  affect  Labs    Chemistry  Recent Labs Lab 03/31/17 2006 04/01/17 0356 04/01/17 1353 04/02/17 0412  NA  --  141 136 137  K  --  6.1* 4.7 4.6  CL  --  109 107 110  CO2  --  17* 17* 19*  GLUCOSE  --  102* 133* 123*  BUN  --  46* 49* 52*  CREATININE  --  3.11* 3.13* 2.94*  CALCIUM  --  9.1 9.1 9.1  PROT 7.1  --   --   --   ALBUMIN 3.9  --   --   --   AST 30  --   --   --   ALT 19  --   --   --   ALKPHOS 61  --   --   --   BILITOT 0.5  --   --   --   GFRNONAA  --  21* 21* 22*  GFRAA  --  24* 24* 26*  ANIONGAP  --  15  12 8     Hematology  Recent Labs Lab 03/31/17 2006 04/01/17 0356 04/02/17 0412  WBC 13.5* 13.1* 8.7  RBC 3.73* 3.62* 3.86*  HGB 10.3* 10.2* 10.4*  HCT 32.3* 31.4* 32.9*  MCV 86.6 86.7 85.2  MCH 27.6 28.2 26.9  MCHC 31.9 32.5 31.6  RDW 13.6 14.1 13.6  PLT 249 241 240    Cardiac Enzymes  Recent Labs Lab 04/01/17 0112 04/01/17 0356 04/01/17 0608  TROPONINI 0.65* 1.54* 1.71*     Recent Labs Lab 03/31/17 2013  TROPIPOC 0.03     BNPNo results for input(s): BNP, PROBNP in the last 168 hours.   DDimer No results for input(s): DDIMER in the last 168 hours.   Radiology    Dg Chest Port 1 View  Result Date: 03/31/2017 CLINICAL DATA:  Chest pain for several days EXAM: PORTABLE CHEST 1 VIEW COMPARISON:  02/07/2017 FINDINGS: Post sternotomy changes with fracture through fourth sternal wire as before. Mild cardiomegaly with mild central congestion. No focal consolidation or effusion. No pneumothorax. IMPRESSION: Mild cardiomegaly with central congestion.  No edema or infiltrate. Electronically Signed   By: Donavan Foil M.D.   On: 03/31/2017 20:34    Cardiac Studies   Cath 11/2013  IMPRESSIONS: Cardiac Cath:  OM 30%. LAD multiple stents. 50% ostial, 20% diffuse proximal ISR, Diffuse 50% ISR mid and distal, Distal edema 100% occluded; 3 very small diagonals. Small circumflex -  40-50% ostial stenosis, Small OM1, moderate size OM 2 and OM 3.  50% ostial OM 2. Ramus intermedius: Large-caliber with 2 branches, superior branch had ostial 70-80% stenosis.  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  RIMA-RCA patent, but insertion is proximal to stenosis site.   Staged PCI via femoral approach - use CLS 3.5 guiding catheter  1. Successful PCI of the mid to distal right coronary artery with overlapping Promus drug-eluting stents, 2.25 x 24 and 2.25 x 16, postdilated to greater than 2.5 mm in diameter. 2.  LVEDP 26 mmHg. elevated LVEDP, likely related to aggressive hydration post catheterization.  Watch for post procedure shortness of breath again tomorrow since we will hydrate him aggressively after this dye load. 3.   AL2 or CLS 3.5 Guide is suitable for engaging the RCA.  CLS 3.5 was used today since we only had AL2 with sideholes in the cath lab.    ECHO 12/2016  Moderate concentric LVH. EF 45-50%. Tear, distal anteroseptal, apical and distal inferoapical akinesis suggestive of LAD infarct. No thrombus. Mild aortic stenosis with no regurgitation. Mean gradient 14 mmHg. Moderate LA dilation.-   Compared to a prior study in 2016, the LVEF is lower at 45-50%   with LAD territory wall motion abnormalities. There is mild   calcific aortic valve stenosis.   Patient Profile     56 y.o. 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  Increase of Cr 3.11 and troponin 1.71  ( has not been able to obtain ranexa yet, [facility])  Assessment & Plan    Principal Problem:   Non-ST  elevation (NSTEMI) myocardial  infarction [facility]) Active Problems:   CAD- CABG 2006, RCA DES March 2015   Chronic combined systolic and diastolic heart failure (HCC)   Essential hypertension   Obesity-BMI 42   Type 2 diabetes mellitus with renal manifestations ([place])   Hyperlipidemia LDL goal <70   Stage 3 chronic kidney disease   Hx of CABG   Hyperkalemia   OSA on CPAP   Precordial chest pain   Chest pain  Principal Problem:   Non-ST elevation (NSTEMI) myocardial infarction [facility]) / CAD- Hx of CABG - LIMA-distal R 2006, RCA DES March 2015  The patient has not had any further chest pain. We had a long talk about options going forward. He is very concerned about his chronic kidney disease and the potential for dialysis and would prefer not to proceed with cardiac catheterization today. We discussed various options (for close to an hour) including invasive approach versus noninvasive approach -> the final plan was that he we will simply treat with medication and reassess after being off heparin tomorrow.  Cannot be sure if this is a type I MI from ACS versus type II MI from volume overload related to prednisone and he seems euvolemic to me making ACS more likely, however.  Based on discussion, plan: Continue IV heparin for total of 72 hours. He continues to ambulate on and off heparin to reassess for chest pain. - If he has recurrent anginal symptoms, we would then need to consider cardiac catheterization was staged PCI, however. He has no chest pain, we can discharge his aggressive medical management.  Continue on aspirin and Plavix and statin.      Chronic combined systolic and diastolic heart failure (HCC) - no active CHF Sx. Euvolemic; appears to be diuresing on his home diuretic dose; baseline EF 45-50% with moderate concentric LVH. We have not rechecked an echo because he is relatively euvolemic.  he is on high-dose carvedilol as well as hydralazine/Imdur for afterload reduction. No ACE  inhibitor/ARB because of renal insufficiency. He is also on amlodipine.   Essential hypertension - relatively well-controlled on current medications. On multiple medications.   Obesity-BMI 42   Type 2 diabetes mellitus with renal manifestations ([place]) - per try and   Hyperlipidemia LDL goal <70 - on Lipitor 80 mg daily   Stage 3 chronic kidney disease with acute on chronic renal insufficiency - nephrology involved. He is concerned about the potential progression of disease if he requires catheterization.  Plan for now as a hold off on cardiac catheterization and reassess after 72 hours of IV heparin.      Hyperkalemia - resolved   OSA on CPAP - continue to use   I spent a total of 60 minutes with the patient in the room well over 75% of the time was spent in direct counseling. Additional 50 minutes spent in charting.    Glenetta Hew, M.D., M.S. Interventional Cardiologist   Pager # [phone] Phone # [phone] [address] [place], [place] [address]

## 2017-04-02 NOTE — Progress Notes (Signed)
I have seen and examined this patient and agree with the plan of care  Appears close to baseline and patient is refusing cardiac catheterization Will sign off and can follow up as outpatient   Dr Rosine Door W 04/02/2017, 3:49 PM Admit: 03/31/2017 LOS: 1  Matthew Rowe is a 56yo male with PCKD, CKD stage 3, T2DM, CAD s/p stenting with AKI and NSTEMI this admission.   Subjective:  Patient denies further chest pain today; he has had good urine output on home lasix dose. No other complaints. Per discussion with Dr. Ellyn Hack, patient hesitant about cardiac cath despite high risk of ACS.   07/23 0701 - 07/24 0700 In: 137.9 [I.V.:137.9] Out: 1450 [Urine:1450]  Filed Weights   03/31/17 1953 04/02/17 0500  Weight: 237 lb (107.5 kg) 229 lb 14.4 oz (104.3 kg)    Scheduled Meds: . amLODipine  10 mg Oral Daily  . aspirin EC  325 mg Oral Daily  . atorvastatin  80 mg Oral Daily  . carvedilol  50 mg Oral BID WC  . clopidogrel  75 mg Oral Daily  . furosemide  40 mg Oral Daily  . gabapentin  300 mg Oral BID  . hydrALAZINE  50 mg Oral TID  . insulin aspart  0-15 Units Subcutaneous TID WC  . isosorbide mononitrate  120 mg Oral BID  . pantoprazole  40 mg Oral Daily  . ranolazine  500 mg Oral BID   Continuous Infusions: . heparin 900 Units/hr (04/01/17 2130)   PRN Meds:.acetaminophen, diphenhydrAMINE, gi cocktail, morphine injection, nitroGLYCERIN, ondansetron (ZOFRAN) IV  Current Labs: reviewed Na 137, K 4.6, Cl 110, bicarb 19, BUN 52, Cr 2.94   Physical Exam:  Blood pressure 131/70, pulse 77, temperature 98.5 F (36.9 C), temperature source Oral, resp. rate 18, height 5\' 5"  (1.651 m), weight 229 lb 14.4 oz (104.3 kg), SpO2 98 %. Constitutional: NAD, pleasant, sitting up on side of bed CV: RRR, systolic murmur, no rubs or gallops, minimal LE edema; pulses intact Resp: CTAB, no increased work of breathing Abd: Obese, nontender Neuro: A&O x3  A 1. AoCKD stage 3, PCKD - Cr 2.9 today  (from 3.1 yesterday) which may be his normal variation in setting of CKD stage 3 (previously ranges from 2.5-2.8). K within normal range 2. NSTEMI - cardiology recommending cath in setting of known CAD, but patient hesitant due to risk of contrast renal injury - on heparin gtt, asa, plavix; plan by cards is to monitor with 72hr of heparin with continued discussion on cath 3. HTN - on amlodipine 10mg  daily, carvedilol 50mg  BID, imdur 120mg  BID, hydralazine 50mg  TID 4. T2DM - was on metformin prior to admission  P 1. Renal function appears to be close to baseline. 2. Continue monitoring renal function 3. Avoid nephrotoxic agents as able (already discussed with patient no NSAIDs; if going for cath using as little contrast as able) 4. Stop metformin, consider switching to different oral agent on discharge 5. Continue HTN meds, anti-anginals 6. Nephrology will sign off for now, please call if further assistance is needed. Patient has appointment with Dr. Florene Glen in a couple of weeks with [facility].  Matthew Grieve, MD PGY - 2 04/02/2017, 11:26 AM   Recent Labs Lab 04/01/17 0356 04/01/17 1353 04/02/17 0412  NA 141 136 137  K 6.1* 4.7 4.6  CL 109 107 110  CO2 17* 17* 19*  GLUCOSE 102* 133* 123*  BUN 46* 49* 52*  CREATININE 3.11* 3.13* 2.94*  CALCIUM 9.1 9.1 9.1    Recent Labs Lab 03/31/17 2006 04/01/17 0356 04/02/17 0412  WBC 13.5* 13.1* 8.7  NEUTROABS  --  10.4*  --   HGB 10.3* 10.2* 10.4*  HCT 32.3* 31.4* 32.9*  MCV 86.6 86.7 85.2  PLT 249 241 240

## 2017-04-02 NOTE — Progress Notes (Addendum)
PROGRESS NOTE    Matthew Rowe  ASN:[id number] DOB: May 04, 1961 DOA: 03/31/2017 PCP: Patient, No Pcp Per   Outpatient Specialists:     Brief Narrative:  Maliek Schellhorn. is a 56 y.o. male with medical history significant of CAD s/p PCI, CHF, CKD stage III, DM type 2, gout; who presents with complaints of chest pain. Patient reports complaints of chest pain off and on over the last several days. However yesterday morning symptoms started and progressively worsened. He reports having left-sided chest pain that he describes as sharp and pressure-like that radiated to the right side of his chest, back, neck, and stomach. Pain worse with deep inspiration. Patient had just been seen by his cardiologist Dr. Harrell Gave End, 2 days ago. It appears he was restarted back on Ranexa at that time, which have been stopped due to insurance not covering it. The following day he was seen in his primary care office for acute pain and swelling of his right forefoot foot. Prior to being seen patient had been drinking lots of cherry juice as he had heard that this helps relieve symptoms. He was placed on a prednisone taper and reports that symptoms have resolved. Today when symptoms started patient tried taking nitroglycerin without relief of symptoms. He reports taking the Ranexa and prednisone recently prescribed. Throughout the day it appears the patient was taking additional medication than what is prescribed. Patient apparently took up to 3500 mg of metformin to self treat for elevated blood sugars and additional 500 mg of Ranexa due to chest pain symptoms. Denies having any change in weight, diaphoresis, nausea, vomiting, loss of consciousness, or lightheadedness feelings.   Assessment & Plan:   Principal Problem:   Non-ST elevation (NSTEMI) myocardial infarction [facility]) Active Problems:   Essential hypertension   OSA on CPAP   Obesity-BMI 42   Type 2 diabetes mellitus with renal manifestations  (HCC)   Precordial chest pain   CAD- CABG 2006, RCA DES March 2015   Hyperlipidemia LDL goal <70   Stage 3 chronic kidney disease   Hx of CABG   Hyperkalemia   Chest pain   NSTEMI -will plan for medical management for now- heparin gtt for 72 hours as this is patient's preference -long discussion in room with patient, family, myself and DrMarland Kitchen Ellyn Hack -imdur/coreg/amlodipine -ASA/plavix -ambulate today and in AM  Hyperkalemia -repeat resolved  Chronic systolic and diastolic CHF: Last echocardiogram showed EF of 45-50% with grade 1 diastolic dysfunction in [id number]/[id number] - Continue Hydralazine, isosorbide mononitrate, Lasix  Coronary artery disease s/p PCI and CABG - Continue Plavix   Essential hypertension - Continue medications as seen above  Steroid-induced hyperglycemia in diabetes mellitus type 2: Patient only on metformin at home. Reports having blood sugars up as high as 360 due to recently starting steroids for gout flare. Last hemoglobin A1c on file was 6.9 and 03/2016. - Hypoglycemic protocols - d/c metformin due to elevated Cr-- ? tradjenta cost - CBGs before meals with SSI  Chronic kidney disease stage III: Stable. Baseline Cr 2.14-2.81.  -nephrology following  Obesity: BMI 42  Gout flare: Now resolved. Patient took 2 doses of prednisone with reported resolution of symptoms. - Discontinued steroid tapered due to patient reported hyperglycemia   OSA -cpap at night   DVT prophylaxis:  Fully anticoagulated   Code Status: Full Code   Family Communication: wife  Disposition Plan:    Consultants:   Cards  nephrology     Subjective: No chest pain currently  Resistant to cath currently  Objective: Vitals:   04/01/17 1319 04/01/17 2105 04/02/17 0500 04/02/17 1014  BP: 126/70 116/62 129/77 131/70  Pulse: 88  82 77  Resp:  18 18   Temp: (!) 97.5 F (36.4 C) 97.7 F (36.5 C) 98.5 F (36.9 C) 98.5 F (36.9 C)  TempSrc: Oral Oral Oral Oral   SpO2: 97% 98%    Weight:   104.3 kg (229 lb 14.4 oz)   Height:        Intake/Output Summary (Last 24 hours) at 04/02/17 1137 Last data filed at 04/02/17 0700  Gross per 24 hour  Intake           137.85 ml  Output             1200 ml  Net         -1062.15 ml   Filed Weights   03/31/17 1953 04/02/17 0500  Weight: 107.5 kg (237 lb) 104.3 kg (229 lb 14.4 oz)    Examination:  General exam: up on side of bed Respiratory system: Clear to auscultation. Respiratory effort normal. Cardiovascular system: S1 & S2 heard, RRR. No pedal edema. Gastrointestinal system: Abdomen is nondistended, soft and nontender. No organomegaly or masses felt. Normal bowel sounds heard. Central nervous system: sleeping Extremities: Symmetric 5 x 5 power. Skin: No rashes, lesions or ulcers Psychiatry: Judgement and insight appear normal. Mood & affect appropriate.     Data Reviewed: I have personally reviewed following labs and imaging studies  CBC:  Recent Labs Lab 03/31/17 2006 04/01/17 0356 04/02/17 0412  WBC 13.5* 13.1* 8.7  NEUTROABS  --  10.4*  --   HGB 10.3* 10.2* 10.4*  HCT 32.3* 31.4* 32.9*  MCV 86.6 86.7 85.2  PLT 249 241 671   Basic Metabolic Panel:  Recent Labs Lab 03/29/17 1352 04/01/17 0356 04/01/17 1353 04/02/17 0412  NA 140 141 136 137  K 4.9 6.1* 4.7 4.6  CL 106 109 107 110  CO2 17* 17* 17* 19*  GLUCOSE 133* 102* 133* 123*  BUN 35* 46* 49* 52*  CREATININE 2.52* 3.11* 3.13* 2.94*  CALCIUM 9.2 9.1 9.1 9.1   GFR: Estimated Creatinine Clearance: 30.8 mL/min (A) (by C-G formula based on SCr of 2.94 mg/dL (H)). Liver Function Tests:  Recent Labs Lab 03/31/17 2006  AST 30  ALT 19  ALKPHOS 61  BILITOT 0.5  PROT 7.1  ALBUMIN 3.9    Recent Labs Lab 03/31/17 2006  LIPASE 40   No results for input(s): AMMONIA in the last 168 hours. Coagulation Profile: No results for input(s): INR, PROTIME in the last 168 hours. Cardiac Enzymes:  Recent Labs Lab  04/01/17 0112 04/01/17 0356 04/01/17 0608  TROPONINI 0.65* 1.54* 1.71*   BNP (last 3 results) No results for input(s): PROBNP in the last 8760 hours. HbA1C: No results for input(s): HGBA1C in the last 72 hours. CBG:  Recent Labs Lab 04/01/17 1207 04/01/17 1642 04/01/17 2336 04/02/17 0722 04/02/17 1115  GLUCAP 121* 153* 150* 128* 191*   Lipid Profile:  Recent Labs  04/01/17 0356  CHOL 146  HDL 29*  LDLCALC 94  TRIG 116  CHOLHDL 5.0   Thyroid Function Tests: No results for input(s): TSH, T4TOTAL, FREET4, T3FREE, THYROIDAB in the last 72 hours. Anemia Panel: No results for input(s): VITAMINB12, FOLATE, FERRITIN, TIBC, IRON, RETICCTPCT in the last 72 hours. Urine analysis:    Component Value Date/Time   COLORURINE YELLOW 03/31/2017 2014   APPEARANCEUR CLEAR 03/31/2017 2014  LABSPEC 1.015 03/31/2017 2014   PHURINE 5.0 03/31/2017 2014   GLUCOSEU NEGATIVE 03/31/2017 2014   HGBUR NEGATIVE 03/31/2017 2014   Carlos NEGATIVE 03/31/2017 2014   [place] 03/31/2017 2014   PROTEINUR 100 (A) 03/31/2017 2014   UROBILINOGEN 1.0 07/17/2015 1425   NITRITE NEGATIVE 03/31/2017 2014   LEUKOCYTESUR NEGATIVE 03/31/2017 2014      Recent Results (from the past 240 hour(s))  MRSA PCR Screening     Status: None   Collection Time: 04/01/17  1:41 AM  Result Value Ref Range Status   MRSA by PCR NEGATIVE NEGATIVE Final    Comment:        The GeneXpert MRSA Assay (FDA approved for NASAL specimens only), is one component of a comprehensive MRSA colonization surveillance program. It is not intended to diagnose MRSA infection nor to guide or monitor treatment for MRSA infections.       Anti-infectives    None       Radiology Studies: Dg Chest Port 1 View  Result Date: 03/31/2017 CLINICAL DATA:  Chest pain for several days EXAM: PORTABLE CHEST 1 VIEW COMPARISON:  02/07/2017 FINDINGS: Post sternotomy changes with fracture through fourth sternal wire as  before. Mild cardiomegaly with mild central congestion. No focal consolidation or effusion. No pneumothorax. IMPRESSION: Mild cardiomegaly with central congestion.  No edema or infiltrate. Electronically Signed   By: Donavan Foil M.D.   On: 03/31/2017 20:34        Scheduled Meds: . amLODipine  10 mg Oral Daily  . aspirin EC  325 mg Oral Daily  . atorvastatin  80 mg Oral Daily  . carvedilol  50 mg Oral BID WC  . clopidogrel  75 mg Oral Daily  . furosemide  40 mg Oral Daily  . gabapentin  300 mg Oral BID  . hydrALAZINE  50 mg Oral TID  . insulin aspart  0-15 Units Subcutaneous TID WC  . isosorbide mononitrate  120 mg Oral BID  . pantoprazole  40 mg Oral Daily  . ranolazine  500 mg Oral BID   Continuous Infusions: . heparin 900 Units/hr (04/01/17 2130)     LOS: 1 day    Time spent: 35 min    [place], DO [facility] Pager [phone]  If 7PM-7AM, please contact night-coverage [email or web address] Password [email or web address] 04/02/2017, 11:37 AM

## 2017-04-02 NOTE — Telephone Encounter (Signed)
Followed by Dr End 

## 2017-04-02 NOTE — Progress Notes (Signed)
Patient places self on and off of CPAP when ready. 

## 2017-04-03 DIAGNOSIS — E1122 Type 2 diabetes mellitus with diabetic chronic kidney disease: Secondary | ICD-10-CM

## 2017-04-03 LAB — CBC
HEMATOCRIT: 33 % — AB (ref 39.0–52.0)
Hemoglobin: 10.6 g/dL — ABNORMAL LOW (ref 13.0–17.0)
MCH: 27.2 pg (ref 26.0–34.0)
MCHC: 32.1 g/dL (ref 30.0–36.0)
MCV: 84.6 fL (ref 78.0–100.0)
Platelets: 225 10*3/uL (ref 150–400)
RBC: 3.9 MIL/uL — ABNORMAL LOW (ref 4.22–5.81)
RDW: 13.6 % (ref 11.5–15.5)
WBC: 8.1 10*3/uL (ref 4.0–10.5)

## 2017-04-03 LAB — GLUCOSE, CAPILLARY
GLUCOSE-CAPILLARY: 188 mg/dL — AB (ref 65–99)
Glucose-Capillary: 180 mg/dL — ABNORMAL HIGH (ref 65–99)

## 2017-04-03 LAB — HEPARIN LEVEL (UNFRACTIONATED): Heparin Unfractionated: 0.51 IU/mL (ref 0.30–0.70)

## 2017-04-03 MED ORDER — ISOSORBIDE MONONITRATE ER 120 MG PO TB24: 120.0000 mg | ORAL_TABLET | Freq: Two times a day (BID) | ORAL | Status: DC

## 2017-04-03 MED ORDER — ASPIRIN 325 MG PO TBEC: 325.0000 mg | DELAYED_RELEASE_TABLET | Freq: Every day | ORAL | 0 refills | Status: DC

## 2017-04-03 MED ORDER — LINAGLIPTIN 5 MG PO TABS: 5.0000 mg | ORAL_TABLET | Freq: Every day | ORAL | Status: DC

## 2017-04-03 MED ORDER — GABAPENTIN 300 MG PO CAPS: 300.0000 mg | ORAL_CAPSULE | Freq: Two times a day (BID) | ORAL | 0 refills | Status: DC

## 2017-04-03 MED ORDER — LINAGLIPTIN 5 MG PO TABS: 5.0000 mg | ORAL_TABLET | Freq: Every day | ORAL | 0 refills | Status: DC

## 2017-04-03 NOTE — Progress Notes (Signed)
Progress Note  Patient Name: Matthew GOLDFARB Sr. Date of Encounter: 04/03/2017  Primary Cardiologist: END  Subjective   No further CP or Dyspnea.   Has made several rounds around the nursing unit while on heparin. Has yet to walk off of heparin  Inpatient Medications    Scheduled Meds: . amLODipine  10 mg Oral Daily  . aspirin EC  325 mg Oral Daily  . atorvastatin  80 mg Oral Daily  . carvedilol  50 mg Oral BID WC  . clopidogrel  75 mg Oral Daily  . furosemide  40 mg Oral Daily  . gabapentin  300 mg Oral BID  . hydrALAZINE  50 mg Oral TID  . insulin aspart  0-15 Units Subcutaneous TID WC  . isosorbide mononitrate  120 mg Oral BID  . pantoprazole  40 mg Oral Daily  . ranolazine  500 mg Oral BID   Continuous Infusions:  PRN Meds: acetaminophen, diphenhydrAMINE, gi cocktail, morphine injection, nitroGLYCERIN, ondansetron (ZOFRAN) IV   Vital Signs    Vitals:   04/02/17 2126 04/02/17 2128 04/03/17 0500 04/03/17 1235  BP: 133/61 133/61 (!) 149/84 135/74  Pulse:   79 77  Resp:   20   Temp:   97.8 F (36.6 C) 97.7 F (36.5 C)  TempSrc:   Oral Oral  SpO2:    98%  Weight:   227 lb (103 kg)   Height:        Intake/Output Summary (Last 24 hours) at 04/03/17 1408 Last data filed at 04/03/17 1015  Gross per 24 hour  Intake            725.2 ml  Output             1600 ml  Net           -874.8 ml   Filed Weights   03/31/17 1953 04/02/17 0500 04/03/17 0500  Weight: 237 lb (107.5 kg) 229 lb 14.4 oz (104.3 kg) 227 lb (103 kg)    Telemetry    Maintaining sinus rhythm with PVCs- Personally Reviewed  ECG    No new EKG- Personally Reviewed  Physical Exam   GEN: Obese, lying in bed with CPAP on. A note 3 once awake. Well-nourished well-groomed Neck: Supple, no LAN or JVD. Cardiac:  RRR, normal S1 and S2. Promus placed PMI. No M/R/G.  Respiratory:  CTA B, nonlabored, good air movement. GI:  of/NT/ND/NABS. No HSM MS:  no C/C/D Neuro:   grossly nonfocal Psych:  Pleasant mood and affect  Labs    Chemistry  Recent Labs Lab 03/31/17 2006 04/01/17 0356 04/01/17 1353 04/02/17 0412  NA  --  141 136 137  K  --  6.1* 4.7 4.6  CL  --  109 107 110  CO2  --  17* 17* 19*  GLUCOSE  --  102* 133* 123*  BUN  --  46* 49* 52*  CREATININE  --  3.11* 3.13* 2.94*  CALCIUM  --  9.1 9.1 9.1  PROT 7.1  --   --   --   ALBUMIN 3.9  --   --   --   AST 30  --   --   --   ALT 19  --   --   --   ALKPHOS 61  --   --   --   BILITOT 0.5  --   --   --   GFRNONAA  --  21* 21* 22*  GFRAA  --  24* 24* 26*  ANIONGAP  --  15 12 8      Hematology  Recent Labs Lab 04/01/17 0356 04/02/17 0412 04/03/17 0239  WBC 13.1* 8.7 8.1  RBC 3.62* 3.86* 3.90*  HGB 10.2* 10.4* 10.6*  HCT 31.4* 32.9* 33.0*  MCV 86.7 85.2 84.6  MCH 28.2 26.9 27.2  MCHC 32.5 31.6 32.1  RDW 14.1 13.6 13.6  PLT 241 240 225    Cardiac Enzymes  Recent Labs Lab 04/01/17 0112 04/01/17 0356 04/01/17 0608  TROPONINI 0.65* 1.54* 1.71*     Recent Labs Lab 03/31/17 2013  TROPIPOC 0.03     BNPNo results for input(s): BNP, PROBNP in the last 168 hours.   DDimer No results for input(s): DDIMER in the last 168 hours.   Radiology    No results found.  Cardiac Studies   Cath 11/2013  IMPRESSIONS: Cardiac Cath:  OM 30%. LAD multiple stents. 50% ostial, 20% diffuse proximal ISR, Diffuse 50% ISR mid and distal, Distal edema 100% occluded; 3 very small diagonals. Small circumflex -  40-50% ostial stenosis, Small OM1, moderate size OM 2 and OM 3. 50% ostial OM 2. Ramus intermedius: Large-caliber with 2 branches, superior branch had ostial 70-80% stenosis.  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  RIMA-RCA patent, but insertion is proximal to stenosis site.   Staged PCI via femoral approach - use CLS 3.5 guiding catheter    1. Successful PCI of the mid to distal right coronary artery with overlapping Promus drug-eluting stents, 2.25 x 24 and 2.25 x 16, postdilated to greater than 2.5 mm in diameter. 2.  LVEDP 26 mmHg. elevated LVEDP, likely related to aggressive hydration post catheterization.  Watch for post procedure shortness of breath again tomorrow since we will hydrate him aggressively after this dye load. 3.   AL2 or CLS 3.5 Guide is suitable for engaging the RCA.  CLS 3.5 was used today since we only had AL2 with sideholes in the cath lab.    ECHO 12/2016  Moderate concentric LVH. EF 45-50%. Tear, distal anteroseptal, apical and distal inferoapical akinesis suggestive of LAD infarct. No thrombus. Mild aortic stenosis with no regurgitation. Mean gradient 14 mmHg. Moderate LA dilation.-   Compared to a prior study in 2016, the LVEF is lower at 45-50%   with LAD territory wall motion abnormalities. There is mild   calcific aortic valve stenosis.   Patient Profile     56 y.o. 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  Increase of Cr 3.11 and troponin 1.71  ( has not been able to obtain ranexa yet, [facility])  Assessment & Plan    Principal Problem:   Non-ST elevation (NSTEMI) myocardial infarction [facility]) Active Problems:   CAD- CABG 2006, RCA DES March 2015   Essential hypertension   Obesity-BMI 42   Type 2 diabetes mellitus with renal manifestations (HCC)   Hyperlipidemia LDL goal <70   Stage 3 chronic kidney disease   Hx of CABG   Hyperkalemia   OSA on CPAP   Precordial chest pain   Chest  pain  Principal Problem:   Non-ST elevation (NSTEMI) myocardial infarction [facility]) / CAD- Hx of  CABG - LIMA-distal R 2006, RCA DES March 2015 -- No further chest pain while on heparin. Has completed 72 hours   Plan is to start heparin today, and laid in the hallway to see if he has recurrence of symptoms.  Refer to long discussion from yesterday's note about the patient's desire to avoid cardiac catheterization. Setting concerns for renal insufficiency.  Has completed early noninvasive strategy treatment for ACS - ambulate while off heparin to reassess. If no recurrent angina, okay for discharge.  Antianginal medications and he is artery on. Ranexa is maxed out 500 twice a day given his renal insufficiency.   At this point, if he has further anginal symptoms, I think the only option will be to do cardiac catheterization  Continue current dose of carvedilol plus hydralazine/nitrate.  Continue on aspirin and Plavix and statin.      Chronic combined systolic and diastolic heart failure (HCC) - no active CHF Sx. Euvolemic; appears to be diuresing on his home diuretic dose; baseline EF 45-50% with moderate concentric LVH. We have not rechecked an echo because he is relatively euvolemic.  On optimal medical management with hydralazine/Imdur for afterload reduction given his renal insufficiency. On high-dose carvedilol and amlodipine  Appears euvolemic on exam on standing dose oral Lasix.     Essential hypertension - remains fairly well controlled on current meds. No changes. .   Obesity-BMI 42 - briefly discussed importance of dietary modification and weight loss   Type 2 diabetes mellitus with renal manifestations (HCC) - per [facility]   Hyperlipidemia LDL goal <70 - Lipitor 80 mg daily   Stage 3 chronic kidney disease with acute on chronic renal insufficiency - nephrology involved. He is concerned about the potential progression of disease if he requires catheterization.  Patient is worse desire to avoid invasive evaluation cardiac catheterizations unless he has recurrent symptoms of  angina  He will need to have a definitive plan with his primary cardiologist as to what the next step would be to have recurrent episodes of pain.      Hyperkalemia - resolved   OSA on CPAP - continue to use    Glenetta Hew, M.D., M.S. Interventional Cardiologist   Pager # [phone] Phone # [phone] [address] [place], [place] [address]

## 2017-04-03 NOTE — Discharge Summary (Signed)
Physician Discharge Summary  Matthew Rowe SWF:[id number] DOB: 03-21-1961 DOA: 03/31/2017  PCP: Patient, No Pcp Per  Admit date: 03/31/2017 Discharge date: 04/03/2017   Recommendations for Outpatient Follow-Up:   1. Needs tighter blood sugar management 2. Close cardiology follow up   Discharge Diagnosis:   Principal Problem:   Non-ST elevation (NSTEMI) myocardial infarction [facility]) Active Problems:   Essential hypertension   OSA on CPAP   Obesity-BMI 42   Type 2 diabetes mellitus with renal manifestations (HCC)   Precordial chest pain   CAD- CABG 2006, RCA DES March 2015   Hyperlipidemia LDL goal <70   Stage 3 chronic kidney disease   Hx of CABG   Hyperkalemia   Chest pain   Discharge disposition:  Home  Discharge Condition: Improved.  Diet recommendation: Low sodium, heart healthy.  Carbohydrate-modified.  Wound care: None.   History of Present Illness:   Matthew Rowe. is a 56 y.o. male with medical history significant of CAD s/p PCI, CHF, CKD stage III, DM type 2, gout; who presents with complaints of chest pain. Patient reports complaints of chest pain off and on over the last several days. However yesterday morning symptoms started and progressively worsened. He reports having left-sided chest pain that he describes as sharp and pressure-like that radiated to the right side of his chest, back, neck, and stomach. Pain worse with deep inspiration. Patient had just been seen by his cardiologist Dr. Harrell Gave End, 2 days ago. It appears he was restarted back on Ranexa at that time, which have been stopped due to insurance not covering it. The following day he was seen in his primary care office for acute pain and swelling of his right forefoot foot. Prior to being seen patient had been drinking lots of cherry juice as he had heard that this helps relieve symptoms. He was placed on a prednisone taper and reports that symptoms have resolved. Today when symptoms  started patient tried taking nitroglycerin without relief of symptoms. He reports taking the Ranexa and prednisone recently prescribed. Throughout the day it appears the patient was taking additional medication than what is prescribed. Patient apparently took up to 3500 mg of metformin to self treat for elevated blood sugars and additional 500 mg of Ranexa due to chest pain symptoms. Denies having any change in weight, diaphoresis, nausea, vomiting, loss of consciousness, or lightheadedness feelings.    Hospital Course by Problem:   NSTEMI -see detailed note from cardiology -medical management of heparin gtt for 72 hours and ambulation off heparin.  Patient did not have any angina with exertion -imdur/coreg/amlodipine -ASA/plavix -close outpatient follow up  Hyperkalemia -resolved  Chronic systolic and diastolicCHF:Last echocardiogram showed EF of 45-50% with grade 1 diastolic dysfunction UK0/2542 - ContinueHydralazine, isosorbide mononitrate, Lasix  Coronary artery disease s/p PCI and CABG - ContinuePlavix   Essentialhypertension - Continue medications   Steroid-induced hyperglycemia in diabetes mellitus type 2:  -Patient only on metformin at home which with his Crcl may not been the best choice, will start tradjenta instead and have patient follow up with PCP  Chronic kidney disease stage III: Stable. Baseline Cr2.14-2.81.  -nephrology outpatient  Obesity: BMI 42  Gout flare: Now resolved. Patient took 2 doses of prednisone with reported resolution of symptoms. - Discontinued steroid tapered due to patient reported hyperglycemia   OSA -cpap at night    Medical Consultants:    Cards.  nephrology   Discharge Exam:   Vitals:   04/03/17 0500 04/03/17  1235  BP: (!) 149/84 135/74  Pulse: 79 77  Resp: 20   Temp: 97.8 F (36.6 C) 97.7 F (36.5 C)   Vitals:   04/02/17 2126 04/02/17 2128 04/03/17 0500 04/03/17 1235  BP: 133/61 133/61 (!) 149/84  135/74  Pulse:   79 77  Resp:   20   Temp:   97.8 F (36.6 C) 97.7 F (36.5 C)  TempSrc:   Oral Oral  SpO2:    98%  Weight:   103 kg (227 lb)   Height:        Gen:  NAD    The results of significant diagnostics from this hospitalization (including imaging, microbiology, ancillary and laboratory) are listed below for reference.     Procedures and Diagnostic Studies:   Dg Chest Port 1 View  Result Date: 03/31/2017 CLINICAL DATA:  Chest pain for several days EXAM: PORTABLE CHEST 1 VIEW COMPARISON:  02/07/2017 FINDINGS: Post sternotomy changes with fracture through fourth sternal wire as before. Mild cardiomegaly with mild central congestion. No focal consolidation or effusion. No pneumothorax. IMPRESSION: Mild cardiomegaly with central congestion.  No edema or infiltrate. Electronically Signed   By: Donavan Foil M.D.   On: 03/31/2017 20:34     Labs:   Basic Metabolic Panel:  Recent Labs Lab 03/29/17 1352 04/01/17 0356 04/01/17 1353 04/02/17 0412  NA 140 141 136 137  K 4.9 6.1* 4.7 4.6  CL 106 109 107 110  CO2 17* 17* 17* 19*  GLUCOSE 133* 102* 133* 123*  BUN 35* 46* 49* 52*  CREATININE 2.52* 3.11* 3.13* 2.94*  CALCIUM 9.2 9.1 9.1 9.1   GFR Estimated Creatinine Clearance: 30.6 mL/min (A) (by C-G formula based on SCr of 2.94 mg/dL (H)). Liver Function Tests:  Recent Labs Lab 03/31/17 2006  AST 30  ALT 19  ALKPHOS 61  BILITOT 0.5  PROT 7.1  ALBUMIN 3.9    Recent Labs Lab 03/31/17 2006  LIPASE 40   No results for input(s): AMMONIA in the last 168 hours. Coagulation profile No results for input(s): INR, PROTIME in the last 168 hours.  CBC:  Recent Labs Lab 03/31/17 2006 04/01/17 0356 04/02/17 0412 04/03/17 0239  WBC 13.5* 13.1* 8.7 8.1  NEUTROABS  --  10.4*  --   --   HGB 10.3* 10.2* 10.4* 10.6*  HCT 32.3* 31.4* 32.9* 33.0*  MCV 86.6 86.7 85.2 84.6  PLT 249 241 240 225   Cardiac Enzymes:  Recent Labs Lab 04/01/17 0112 04/01/17 0356  04/01/17 0608  TROPONINI 0.65* 1.54* 1.71*   BNP: Invalid input(s): POCBNP CBG:  Recent Labs Lab 04/02/17 1115 04/02/17 1640 04/02/17 2119 04/03/17 0733 04/03/17 1236  GLUCAP 191* 190* 141* 180* 188*   D-Dimer No results for input(s): DDIMER in the last 72 hours. Hgb A1c No results for input(s): HGBA1C in the last 72 hours. Lipid Profile  Recent Labs  04/01/17 0356  CHOL 146  HDL 29*  LDLCALC 94  TRIG 116  CHOLHDL 5.0   Thyroid function studies No results for input(s): TSH, T4TOTAL, T3FREE, THYROIDAB in the last 72 hours.  Invalid input(s): FREET3 Anemia work up No results for input(s): VITAMINB12, FOLATE, FERRITIN, TIBC, IRON, RETICCTPCT in the last 72 hours. Microbiology Recent Results (from the past 240 hour(s))  MRSA PCR Screening     Status: None   Collection Time: 04/01/17  1:41 AM  Result Value Ref Range Status   MRSA by PCR NEGATIVE NEGATIVE Final    Comment:  The GeneXpert MRSA Assay (FDA approved for NASAL specimens only), is one component of a comprehensive MRSA colonization surveillance program. It is not intended to diagnose MRSA infection nor to guide or monitor treatment for MRSA infections.      Discharge Instructions:   Discharge Instructions    Diet - low sodium heart healthy    Complete by:  As directed    Diet Carb Modified    Complete by:  As directed    Discharge instructions    Complete by:  As directed    Close follow up with renal and cardiology   Increase activity slowly    Complete by:  As directed      Allergies as of 04/03/2017      Reactions   Ciprocin-fluocin-procin [fluocinolone Acetonide] Rash   Clarithromycin Itching, Other (See Comments)   "Biaxin" Eyes itch and burn   Cleocin [clindamycin Hcl] Anaphylaxis, Swelling   Glimepiride [amaryl] Other (See Comments)   Elevates liver function   Colchicine Other (See Comments)   Affected kidneys      Medication List    STOP taking these medications     aspirin 81 MG tablet Replaced by:  aspirin 325 MG EC tablet   metFORMIN 1000 MG tablet Commonly known as:  GLUCOPHAGE   predniSONE 20 MG tablet Commonly known as:  DELTASONE     TAKE these medications   acetaminophen 500 MG tablet Commonly known as:  TYLENOL Take 500-1,000 mg by mouth every 6 (six) hours as needed (for pain or headaches).   amLODipine 10 MG tablet Commonly known as:  NORVASC Take 1 tablet (10 mg total) by mouth daily.   aspirin 325 MG EC tablet Take 1 tablet (325 mg total) by mouth daily. Replaces:  aspirin 81 MG tablet   atorvastatin 80 MG tablet Commonly known as:  LIPITOR Take 1 tablet (80 mg total) by mouth daily.   carvedilol 25 MG tablet Commonly known as:  COREG Take 2 tablets (50 mg total) by mouth 2 (two) times daily.   clopidogrel 75 MG tablet Commonly known as:  PLAVIX TAKE ONE TABLET BY MOUTH ONCE DAILY WITH BREAKFAST What changed:  See the new instructions.   diphenhydrAMINE 25 MG tablet Commonly known as:  BENADRYL Take 25 mg by mouth every 6 (six) hours as needed for allergies.   Fish Oil 1000 MG Caps Take 2,000 mg by mouth 2 (two) times daily.   furosemide 40 MG tablet Commonly known as:  LASIX Take 1 tablet (40 mg total) by mouth daily.   gabapentin 300 MG capsule Commonly known as:  NEURONTIN Take 1 capsule (300 mg total) by mouth 2 (two) times daily. What changed:  when to take this   hydrALAZINE 50 MG tablet Commonly known as:  APRESOLINE Take 1 tablet (50 mg total) by mouth 3 (three) times daily.   isosorbide mononitrate 120 MG 24 hr tablet Commonly known as:  IMDUR Take 1 tablet (120 mg total) by mouth 2 (two) times daily.   linagliptin 5 MG Tabs tablet Commonly known as:  TRADJENTA Take 1 tablet (5 mg total) by mouth daily.   multivitamin with minerals Tabs tablet Take 1 tablet by mouth daily.   nitroGLYCERIN 0.4 MG/SPRAY spray Commonly known as:  NITROLINGUAL PLACE ONE SPRAY UNDER THE TONGUE EVERY 5  MINUTES FOR 3 DOSES AS NEEDED FOR CHEST PAIN What changed:  See the new instructions.   pantoprazole 40 MG tablet Commonly known as:  PROTONIX Take 40 mg by  mouth daily.   ranolazine 500 MG 12 hr tablet Commonly known as:  RANEXA Take 1 tablet (500 mg total) by mouth 2 (two) times daily.   vitamin E 400 UNIT capsule Take 400 Units by mouth daily.      Follow-up Information    PCP 1 week,cardiology as scheduled,renal Follow up.            Time coordinating discharge: 35 min  Signed:  Nari Vannatter U Kymia Simi   [facility] 04/03/2017, 4:26 PM

## 2017-04-03 NOTE — Progress Notes (Signed)
[place] for Heparin  Indication: chest pain/ACS  Allergies  Allergen Reactions  . Ciprocin-Fluocin-Procin [Fluocinolone Acetonide] Rash  . Clarithromycin Itching and Other (See Comments)    "Biaxin" Eyes itch and burn  . Cleocin [Clindamycin Hcl] Anaphylaxis and Swelling  . Glimepiride [Amaryl] Other (See Comments)    Elevates liver function   . Colchicine Other (See Comments)    Affected kidneys   Patient Measurements: Height: 5\' 5"  (165.1 cm) Weight: 227 lb (103 kg) IBW/kg (Calculated) : 61.5  Vital Signs: Temp: 97.8 F (36.6 C) (07/25 0500) Temp Source: Oral (07/25 0500) BP: 149/84 (07/25 0500) Pulse Rate: 79 (07/25 0500)  Labs:  Recent Labs  04/01/17 0112 04/01/17 0356 04/01/17 0608  04/01/17 1353  04/02/17 0412 04/02/17 1210 04/03/17 0239  HGB  --  10.2*  --   --   --   --  10.4*  --  10.6*  HCT  --  31.4*  --   --   --   --  32.9*  --  33.0*  PLT  --  241  --   --   --   --  240  --  225  HEPARINUNFRC  --   --   --   < >  --   < > 0.60 0.67 0.51  CREATININE  --  3.11*  --   --  3.13*  --  2.94*  --   --   TROPONINI 0.65* 1.54* 1.71*  --   --   --   --   --   --   < > = values in this interval not displayed.  Estimated Creatinine Clearance: 30.6 mL/min (A) (by C-G formula based on SCr of 2.94 mg/dL (H)).    Assessment: 56 YOM continues on IV heparin for chest pain.  Patient is hesitant to proceed with cath so the current plan is to continue heparin for 72 hours and then reassess.  Heparin level remains therapeutic at 0.51 units/mL after rate decrease yesterday. H&H stable, pltc WNL. No bleeding reported.   Goal of Therapy:  Heparin level 0.3-0.7 units/ml Monitor platelets by anticoagulation protocol: Yes     Plan:  Continue heparin gtt at 800 units/hr Daily heparin level and CBC F/U with plan tomorrow   Mila Merry. Gerarda Fraction, PharmD PGY1 Pharmacy Resident Pager: [phone]  04/03/2017, 8:52 AM   Remigio Eisenmenger D.  Mina Marble, PharmD, BCPS Pager:  [phone] 04/03/2017, 10:31 AM

## 2017-05-22 ENCOUNTER — Telehealth: Payer: Self-pay

## 2017-05-22 NOTE — Telephone Encounter (Addendum)
**Note De-Identified Matthew Rowe Obfuscation** Denial received Matthew Rowe fax from [facility] for [place]. Reason: Ranexa does not qualify for lower co pay (tier exception under Medicare part D). Branded drugs are not eligible for reduction to a lower tier the contains only generic drugs.  I have completed an appeal form and faxed it back to [facility].

## 2017-06-03 ENCOUNTER — Ambulatory Visit: Payer: Medicare Other | Admitting: Internal Medicine

## 2017-06-18 NOTE — Telephone Encounter (Addendum)
**Note De-Identified Matthew Rowe Obfuscation** Received a denial from [place] on Renexa appeal. Reason: same as prior denial.

## 2017-06-26 ENCOUNTER — Other Ambulatory Visit: Payer: Self-pay | Admitting: Internal Medicine

## 2017-06-26 DIAGNOSIS — I5032 Chronic diastolic (congestive) heart failure: Secondary | ICD-10-CM

## 2017-06-26 DIAGNOSIS — N183 Chronic kidney disease, stage 3 unspecified: Secondary | ICD-10-CM

## 2017-07-22 ENCOUNTER — Encounter: Payer: Self-pay | Admitting: Internal Medicine

## 2017-07-22 ENCOUNTER — Ambulatory Visit (INDEPENDENT_AMBULATORY_CARE_PROVIDER_SITE_OTHER): Payer: Medicare Other | Admitting: Internal Medicine

## 2017-07-22 VITALS — BP 124/76 | HR 75 | Ht 65.0 in | Wt 228.0 lb

## 2017-07-22 DIAGNOSIS — I10 Essential (primary) hypertension: Secondary | ICD-10-CM | POA: Diagnosis not present

## 2017-07-22 DIAGNOSIS — I25118 Atherosclerotic heart disease of native coronary artery with other forms of angina pectoris: Secondary | ICD-10-CM

## 2017-07-22 DIAGNOSIS — I5032 Chronic diastolic (congestive) heart failure: Secondary | ICD-10-CM

## 2017-07-22 DIAGNOSIS — N184 Chronic kidney disease, stage 4 (severe): Secondary | ICD-10-CM

## 2017-07-22 DIAGNOSIS — E782 Mixed hyperlipidemia: Secondary | ICD-10-CM

## 2017-07-22 DIAGNOSIS — I255 Ischemic cardiomyopathy: Secondary | ICD-10-CM | POA: Diagnosis not present

## 2017-07-22 NOTE — Patient Instructions (Signed)
Medication Instructions:  Your physician recommends that you continue on your current medications as directed. Please refer to the Current Medication list given to you today.   Labwork: None  Testing/Procedures: None  Follow-Up: Your physician recommends that you schedule a follow-up appointment in: 3 months with Dr End.         If you need a refill on your cardiac medications before your next appointment, please call your pharmacy.   

## 2017-07-22 NOTE — Progress Notes (Signed)
Follow-up Outpatient Visit Date: 07/22/2017  Primary Care Provider: Patient, No Pcp Per No address on file  Chief Complaint: Chest pain and shortness of breath  HPI:  Matthew Rowe is a 56 y.o. 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 I last saw Matthew Rowe in late July, at which time he was doing well. He noted occasional mild chest pain, overall well controlled and consistent with his stable angina. He had not had any more shortness of breath or edema. He was in the process of establishing care through the [place] and was hopeful that he could obtain ranolazine, which had helped him significantly in the past but had been cost prohibitive. He was hospitalized 2 days later with chest pain and was found to have an elevated troponin (up to 1.71 last checked). He was treated with IV heparin for NSTEMI and was continued on aggressive antianginal regimen. Cardiac catheterization was discussed with Matthew Rowe but he declined due to his chronic kidney disease.  Since that time, Matthew Rowe has been feeling relatively well. He has continued to have episodic chest pain but notes that since taking ranolazine regularly over the last 2 weeks, his pain has been much improved. He has intermittent exertional dyspnea when walking up hill near his home to get to his vehicle. He feels as though his weight has been up since leaving the hospital in late July, which prompted him to increase furosemide to 40 mg twice a day. He denies significant edema at this time. He has stable 3 pillow orthopnea without PND. He denies palpitations and lightheadedness. Matthew Rowe followed up with his nephrologist, Dr. Justin Mend, earlier this week and reports having had blood work  done at that time.  --------------------------------------------------------------------------------------------------  Cardiovascular History & Procedures: Cardiovascular Problems:  Coronary artery disease with chronic stable angina  Ischemic cardiomyopathy and chronic diastolic heart failure  Risk Factors:  Known coronary artery disease, hypertension, hyperlipidemia, diabetes mellitus, and male gender  Cath/PCI:  LHC (12/02/13): LMCA with 30% distal stenosis. LAD with multiple stents and 50% ostial stenosis followed by 20% diffuse in-stent restenosis proximally. There is diffuse 50% in-stent restenosis in the mid and distal segments. The distal LAD is occluded. LCx is small with 40-50% ostial stenosis. OM 2 has a 50% ostial stenosis. Ramus intermedius is a large vessel with 70-80% stenosis involving its superior branch. 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 There is diffuse 20% proximal disease. There is a 90% stenosis involving the distal vessel just beyond the anastomosis with the RIMA. 60% stenosis extending into the RPDA is also evident. RIMA to RCA is widely patent with aforementioned stenosis just beyond the distal anastomosis.  PCI (12/03/13): Successful PCI to the mid/distal RCA with overlapping Promus drug-eluting stents (2.25 x 24 and 2.25 x 16 mm).  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  EP Procedures and Devices:  None  Non-Invasive Evaluation(s):  Transthoracic echocardiogram (12/18/16): Normal LV size with moderate LVH. LVEF mildly reduced at 45-50% with anterior, apical septal, apical, and apical inferoapical akinesis. Grade 1 diastolic dysfunction. Trileaflet aortic valve with mild stenosis (mean gradient 14 mmHg). Moderately dilated left ventricle. Mildly dilated right ventricle with mildly reduced contraction. Normal  pulmonary artery and central venous pressure.  Pharmacologic myocardial  perfusion stress test (11/16/16): High risk study with large fixed inferior defect extending to the apex. Dilated left ventricle with akinesis of the inferior wall. LVEF 35%.  Transthoracic echocardiogram (12/18/16): Normal LV size with moderate LVH. LVEF mildly reduced (45-50%). There is anterior and apical akinesis suggestive of LAD disease. Mild aortic stenosis noted with a mean gradient of 14 mmHg. Left atrium moderately dilated. Right ventricle mildly dilated and mildly reduced contraction.  Pharmacologic myocardial perfusion stress test (11/16/16): Large fixed differential defect involving the inferior wall and apex consistent with scar. LVEF 35% with inferior akinesis.  Transthoracic echocardiogram (01/02/15): Normal LV size with mild LVH. LVEF 50-55% with normal wall motion and grade 1 diastolic dysfunction. Very mild aortic stenosis. Mitral and or calcium patient with mild leaflet thickening and regurgitation. Normal RV size and function.  Pharmacologic myocardial perfusion stress test (11/28/13): Large, partially reversible inferolateral defect. Fixed apical defect. LVEF 54%.  Past medical and surgical history were reviewed and updated in EPIC.  Current Meds  Medication Sig  . acetaminophen (TYLENOL) 500 MG tablet Take 500-1,000 mg by mouth every 6 (six) hours as needed (for pain or headaches).  Marland Kitchen amLODipine (NORVASC) 10 MG tablet Take 1 tablet (10 mg total) by mouth daily.  Marland Kitchen aspirin EC 325 MG EC tablet Take 1 tablet (325 mg total) by mouth daily.  Marland Kitchen atorvastatin (LIPITOR) 80 MG tablet Take 1 tablet (80 mg total) by mouth daily.  . carvedilol (COREG) 25 MG tablet Take 2 tablets (50 mg total) by mouth 2 (two) times daily.  . clopidogrel (PLAVIX) 75 MG tablet TAKE ONE TABLET BY MOUTH ONCE DAILY WITH BREAKFAST (Patient taking differently: Take 75 mg by mouth once a day with breakfast)  . cyclobenzaprine (FLEXERIL) 10 MG tablet Take 5 mg at bedtime by mouth.  . diphenhydrAMINE (BENADRYL) 25 MG  tablet Take 25 mg by mouth every 6 (six) hours as needed for allergies.  . furosemide (LASIX) 40 MG tablet TAKE 1 TABLET BY MOUTH ONCE DAILY (Patient taking differently: TAKE 1 TABLET BY MOUTH TWICE A DAY)  . gabapentin (NEURONTIN) 300 MG capsule Take 1 capsule (300 mg total) by mouth 2 (two) times daily.  . hydrALAZINE (APRESOLINE) 50 MG tablet Take 1 tablet (50 mg total) by mouth 3 (three) times daily.  . isosorbide mononitrate (IMDUR) 120 MG 24 hr tablet Take 1 tablet (120 mg total) by mouth 2 (two) times daily.  . Multiple Vitamin (MULTIVITAMIN WITH MINERALS) TABS tablet Take 1 tablet by mouth daily.  . nateglinide (STARLIX) 120 MG tablet Take 120 mg 3 (three) times daily with meals by mouth.  . nitroGLYCERIN (NITROLINGUAL) 0.4 MG/SPRAY spray PLACE ONE SPRAY UNDER THE TONGUE EVERY 5 MINUTES FOR 3 DOSES AS NEEDED FOR CHEST PAIN  . Omega-3 Fatty Acids (FISH OIL) 1000 MG CAPS Take 2,000 mg by mouth 2 (two) times daily.  . pantoprazole (PROTONIX) 40 MG tablet Take 40 mg by mouth daily.   . ranolazine (RANEXA) 500 MG 12 hr tablet Take 1 tablet (500 mg total) by mouth 2 (two) times daily.  . traMADol (ULTRAM) 50 MG tablet Take 50 mg every 8 (eight) hours as needed by mouth.  . vitamin E 400 UNIT capsule Take 400 Units by mouth daily.    Allergies: Ciprocin-fluocin-procin [fluocinolone acetonide]; Clarithromycin; Cleocin [clindamycin hcl]; Glimepiride [amaryl]; and Colchicine  Social History   Socioeconomic History  . Marital status: Married    Spouse name: Matthew Rowe  .  Number of children: 5  . Years of education: Not on file  . Highest education level: Not on file  Social Needs  . Financial resource strain: Not on file  . Food insecurity - worry: Not on file  . Food insecurity - inability: Not on file  . Transportation needs - medical: Not on file  . Transportation needs - non-medical: Not on file  Occupational History  . Occupation: retired    Comment: Chartered loss adjuster  Tobacco Use  .  Smoking status: Former Smoker    Types: Cigarettes  . Smokeless tobacco: Never Used  . Tobacco comment: smoked some as a teenager (high school) not even a pack a week  Substance and Sexual Activity  . Alcohol use: No    Alcohol/week: 0.0 oz    Comment: 09/15/2012 "drank a little when I was young"  . Drug use: No  . Sexual activity: Yes  Other Topics Concern  . Not on file  Social History Narrative   Married and lives with wife in [place]. On disability.    Consumes about 2 Coke Zeros a day     Family History  Problem Relation Age of Onset  . Lung cancer Mother   . Anemia Mother   . Polycystic kidney disease Mother   . Diabetes Father   . Heart attack Father 46       Died suddenly  . Heart failure Father   . Hyperlipidemia Father   . Hypertension Father   . Kidney failure Sister 5       Died  . Heart attack Sister   . Hypertension Sister   . Diabetes Sister   . Diabetes Brother   . Polycystic kidney disease Brother   . Diabetes Sister     Review of Systems: A 12-system review of systems was performed and was negative except as noted in the HPI.  --------------------------------------------------------------------------------------------------  Physical Exam: BP 124/76   Pulse 75   Ht 5\' 5"  (1.651 m)   Wt 228 lb (103.4 kg)   SpO2 98%   BMI 37.94 kg/m   General:  Obese man, seated comfortably in the exam room. HEENT: No conjunctival pallor or scleral icterus. Moist mucous membranes.  OP clear. Neck: Supple without lymphadenopathy, thyromegaly, JVD, or HJR, though evaluation is limited by body habitus. Lungs: Normal work of breathing. [place] diminished breath sounds throughout without wheezes or crackles. Heart: Distant heart sounds. Regular rate and rhythm without murmurs, rubs, or gallops. Unable to assess PMI due to body habitus. Abd: Bowel sounds present. Soft, NT/ND. Unable to assess HSM due to body habitus. Ext: No lower extremity edema. Radial, PT, and DP pulses  are 2+ bilaterally. Skin: Warm and dry without rash.  EKG:  Normal sinus rhythm, left axis deviation, poor R-wave progression, and nonspecific intraventricular conduction delay.  Lab Results  Component Value Date   WBC 8.1 04/03/2017   HGB 10.6 (L) 04/03/2017   HCT 33.0 (L) 04/03/2017   MCV 84.6 04/03/2017   PLT 225 04/03/2017    Lab Results  Component Value Date   NA 137 04/02/2017   K 4.6 04/02/2017   CL 110 04/02/2017   CO2 19 (L) 04/02/2017   BUN 52 (H) 04/02/2017   CREATININE 2.94 (H) 04/02/2017   GLUCOSE 123 (H) 04/02/2017   ALT 19 03/31/2017    Lab Results  Component Value Date   CHOL 146 04/01/2017   HDL 29 (L) 04/01/2017   LDLCALC 94 04/01/2017   LDLDIRECT 72.0 11/03/2014  TRIG 116 04/01/2017   CHOLHDL 5.0 04/01/2017    --------------------------------------------------------------------------------------------------  ASSESSMENT AND PLAN: Coronary artery disease with stable angina Chest pain improved with regular use of ranolazine. Now that Mr. Carias is able to get this medication through the [place], I am hopeful that he will be compliant with it. I will not make any changes to his current medication regimen. We again discussed risks and benefits of coronary angiography to defer this, as Mr. Vanessen remains very concerned about his chronic kidney disease and potential worsening of his renal function with IV contrast.  Ischemic cardiomyopathy and chronic diastolic heart failure Mr. Delay appears euvolemic on exam with NYHA class II-III heart failure symptoms. His weight is stable compared to last visit in July and down from his hospital admission weight. It is resolved to continue with furosemide 40 mg twice a day. We will request recent lab results from Dr. Jason Nest office.  Chronic kidney disease Patient recently evaluated by Dr. Justin Mend. We will request a copy of blood work done at that time. We will function precludes addition of ACE inhibitor/ARB and  spironolactone.  Hypertension Blood pressure adequately controlled today. No medication changes.  Hyperlipidemia Continue atorvastatin 80 mg daily.  Follow-up: Return to clinic in 3 months.  Nelva Bush, MD 07/22/2017 1:21 PM

## 2017-08-06 ENCOUNTER — Telehealth: Payer: Self-pay | Admitting: Internal Medicine

## 2017-08-06 NOTE — Telephone Encounter (Signed)
Walk in pt Form-Jury Summons Letter Dropped off. Placed in Dr.End doc Box

## 2017-08-09 ENCOUNTER — Encounter (HOSPITAL_COMMUNITY): Payer: Self-pay | Admitting: *Deleted

## 2017-08-09 ENCOUNTER — Emergency Department (HOSPITAL_COMMUNITY): Payer: Medicare Other

## 2017-08-09 ENCOUNTER — Emergency Department (HOSPITAL_COMMUNITY)
Admission: EM | Admit: 2017-08-09 | Discharge: 2017-08-09 | Disposition: A | Discharge status: Home / Self Care | Payer: Medicare Other | Attending: Emergency Medicine | Admitting: Emergency Medicine

## 2017-08-09 DIAGNOSIS — I132 Hypertensive heart and chronic kidney disease with heart failure and with stage 5 chronic kidney disease, or end stage renal disease: Secondary | ICD-10-CM | POA: Diagnosis not present

## 2017-08-09 DIAGNOSIS — N185 Chronic kidney disease, stage 5: Secondary | ICD-10-CM | POA: Insufficient documentation

## 2017-08-09 DIAGNOSIS — R0602 Shortness of breath: Secondary | ICD-10-CM | POA: Diagnosis not present

## 2017-08-09 DIAGNOSIS — Z7902 Long term (current) use of antithrombotics/antiplatelets: Secondary | ICD-10-CM | POA: Insufficient documentation

## 2017-08-09 DIAGNOSIS — M79671 Pain in right foot: Secondary | ICD-10-CM | POA: Diagnosis present

## 2017-08-09 DIAGNOSIS — I208 Other forms of angina pectoris: Secondary | ICD-10-CM | POA: Insufficient documentation

## 2017-08-09 DIAGNOSIS — Z79899 Other long term (current) drug therapy: Secondary | ICD-10-CM | POA: Diagnosis not present

## 2017-08-09 DIAGNOSIS — Z7982 Long term (current) use of aspirin: Secondary | ICD-10-CM | POA: Diagnosis not present

## 2017-08-09 DIAGNOSIS — I252 Old myocardial infarction: Secondary | ICD-10-CM | POA: Diagnosis not present

## 2017-08-09 DIAGNOSIS — Z7984 Long term (current) use of oral hypoglycemic drugs: Secondary | ICD-10-CM | POA: Diagnosis not present

## 2017-08-09 DIAGNOSIS — M722 Plantar fascial fibromatosis: Secondary | ICD-10-CM | POA: Insufficient documentation

## 2017-08-09 DIAGNOSIS — Z951 Presence of aortocoronary bypass graft: Secondary | ICD-10-CM | POA: Diagnosis not present

## 2017-08-09 DIAGNOSIS — I5032 Chronic diastolic (congestive) heart failure: Secondary | ICD-10-CM | POA: Diagnosis not present

## 2017-08-09 DIAGNOSIS — Z87891 Personal history of nicotine dependence: Secondary | ICD-10-CM | POA: Diagnosis not present

## 2017-08-09 DIAGNOSIS — R079 Chest pain, unspecified: Secondary | ICD-10-CM | POA: Diagnosis not present

## 2017-08-09 DIAGNOSIS — E1122 Type 2 diabetes mellitus with diabetic chronic kidney disease: Secondary | ICD-10-CM | POA: Insufficient documentation

## 2017-08-09 LAB — CBC
HEMATOCRIT: 33.2 % — AB (ref 39.0–52.0)
HEMOGLOBIN: 10.9 g/dL — AB (ref 13.0–17.0)
MCH: 28 pg (ref 26.0–34.0)
MCHC: 32.8 g/dL (ref 30.0–36.0)
MCV: 85.3 fL (ref 78.0–100.0)
Platelets: 186 10*3/uL (ref 150–400)
RBC: 3.89 MIL/uL — ABNORMAL LOW (ref 4.22–5.81)
RDW: 13.4 % (ref 11.5–15.5)
WBC: 8.7 10*3/uL (ref 4.0–10.5)

## 2017-08-09 LAB — BASIC METABOLIC PANEL
Anion gap: 8 (ref 5–15)
BUN: 44 mg/dL — ABNORMAL HIGH (ref 6–20)
CHLORIDE: 108 mmol/L (ref 101–111)
CO2: 22 mmol/L (ref 22–32)
Calcium: 8.9 mg/dL (ref 8.9–10.3)
Creatinine, Ser: 3.59 mg/dL — ABNORMAL HIGH (ref 0.61–1.24)
GFR calc non Af Amer: 17 mL/min — ABNORMAL LOW (ref >60)
GFR, EST AFRICAN AMERICAN: 20 mL/min — AB (ref >60)
Glucose, Bld: 185 mg/dL — ABNORMAL HIGH (ref 65–99)
Potassium: 4.3 mmol/L (ref 3.5–5.1)
Sodium: 138 mmol/L (ref 135–145)

## 2017-08-09 LAB — I-STAT TROPONIN, ED
TROPONIN I, POC: 0 ng/mL (ref 0.00–0.08)
Troponin i, poc: 0.01 ng/mL (ref 0.00–0.08)

## 2017-08-09 MED ORDER — TRAMADOL HCL 50 MG PO TABS
50.0000 mg | ORAL_TABLET | Freq: Once | ORAL | Status: AC
  Administered 2017-08-09: 50 mg via ORAL
  Filled 2017-08-09: qty 1

## 2017-08-09 MED ORDER — TRAMADOL HCL 50 MG PO TABS: 50.0000 mg | ORAL_TABLET | Freq: Four times a day (QID) | ORAL | 0 refills | Status: DC | PRN

## 2017-08-09 MED ORDER — NITROGLYCERIN 0.4 MG SL SUBL: 0.4000 mg | SUBLINGUAL_TABLET | SUBLINGUAL | Status: DC | PRN

## 2017-08-09 NOTE — ED Provider Notes (Signed)
[place] EMERGENCY DEPARTMENT Provider Note   CSN: [id number] Arrival date & time: 08/09/17  0813     History   Chief Complaint Chief Complaint  Patient presents with  . Foot Pain    HPI Matthew Rowe. is a 56 y.o. male.  HPI   Was trying to walk on the heel of the foot because couldn't put pressure on foot to walk. Because of where car parked had to go up hill and while walking up the hill started to have left sided chest pain. Took nitro pain which eased the pain off, but still having pain in left arm.  Reports tightness/pressure on chest, pain radiating to the left arm.  Was going to [place] center but was in too much pain.  Did have shortness of breath but this resolved.  No nausea or vomiting.  No diaphoresis.  Taking ranexa which has helped with angina, but had to walk a long time today. Today pain started around 730AM.  Did have cath 2015. Dr. Saunders Revel is Cardiologist.  Arm pain 5/10 now.  Prior MI had severe pain, this is different.    Past Medical History:  Diagnosis Date  . Anginal pain ([place])   . CKD (chronic kidney disease), stage III ([place])   . Coronary artery disease    a. INF MI 1999: PCI of RCA;  b.  extensive stenting of LAD;  c. s/p CABG with RIMA->RCA in 2006;  d. Stable, Low risk MV 05/2012; e. 09/2012 Cath: stable anatomy->med Rx. f. Abnl nuc 11/2013 -> med rx; g. 11/2013 Cath/PCI: LM 30d, LAD 50ost, 20/50isr, 100d, LCX 40-50ost, OM2 50ost, RI 70-80ost sup branch, RCA 20p, 90/60d s/p DES, PDA 60p, RIMA->RCA ok.  d. 11/2016: nuc with EF 35%, no reversible ischem  . Diastolic heart failure ([place])   . DM2 (diabetes mellitus, type 2) ([place])   . Dyslipidemia   . GERD (gastroesophageal reflux disease)   . History of gout   . Hx of echocardiogram    a. Echo 3/12: mild LVH, EF 55-60%, trivial AI  . Hypertensive heart disease   . Ischemic cardiomyopathy   . Myocardial infarction [facility]) 1999; 2002; 2003; 2006; ~ 2008  . OSA on CPAP   . Polycystic  kidney disease     Patient Active Problem List   Diagnosis Date Noted  . Chronic kidney disease, stage IV (severe) ([place]) 07/22/2017  . Hyperkalemia 04/01/2017  . Non-ST elevation (NSTEMI) myocardial infarction ([place]) 04/01/2017  . Chest pain 04/01/2017  . Hx of CABG   . Ischemic cardiomyopathy 02/21/2017  . Chronic diastolic heart failure ([place]) 02/08/2017  . Acute on chronic diastolic CHF (congestive heart failure) ([place]) 02/08/2017  . Mixed hyperlipidemia   . Stage 3 chronic kidney disease ([place])   . Chronic kidney disease, stage III (moderate) ([facility])   . GERD (gastroesophageal reflux disease) 05/08/2015  . Gout 05/08/2015  . MI (myocardial infarction) ([place]) 04/19/2015  . Coronary artery disease   . Hypertensive heart disease   . Precordial chest pain 11/27/2013  . Financial difficulty 03/13/2013  . Medically noncompliant 03/13/2013  . Type 2 diabetes mellitus without complications ([place])   . Obesity-BMI 42 11/27/2012  . Polycystic kidney disease 09/15/2012  . OSA on CPAP 09/15/2012  . Essential hypertension 03/27/2011  . Hyperlipidemia associated with type 2 diabetes mellitus [facility])     Past Surgical History:  Procedure Laterality Date  . CARDIAC CATHETERIZATION  2012  . CARDIAC CATHETERIZATION  2013  .  CARDIAC CATHETERIZATION  2014   LHC (1/14): total occlusion of distal LAD, diffuse disease in ramus, 70% proximal LCx, 50% proximal and mid RCA, 50% distal RCA, patent RIMA-RCA.  Marland Kitchen CORONARY ANGIOPLASTY WITH STENT PLACEMENT  1999 / 2002 / 2004 / 2006?  . CORONARY ARTERY BYPASS GRAFT  2006   CABG X?; in [place]  . CYSTECTOMY  1990's   off face  . LEFT HEART CATHETERIZATION WITH CORONARY/GRAFT ANGIOGRAM N/A 09/16/2012   Procedure: LEFT HEART CATHETERIZATION WITH [name];  Surgeon: [name], MD;  Location: [facility] CATH LAB;  Service: Cardiovascular;  Laterality: N/A;  . LEFT HEART CATHETERIZATION WITH CORONARY/GRAFT ANGIOGRAM N/A 12/02/2013   Procedure: LEFT HEART  CATHETERIZATION WITH [name];  Surgeon: [name], MD;  Location: [place] CATH LAB;  Service: Cardiovascular;  Laterality: N/A;  . PERCUTANEOUS CORONARY STENT INTERVENTION (PCI-S) N/A 12/03/2013   Procedure: PERCUTANEOUS CORONARY STENT INTERVENTION (PCI-S);  Surgeon: [name], MD;  Location: [facility] CATH LAB;  Service: Cardiovascular;  Laterality: N/A;       Home Medications    Prior to Admission medications   Medication Sig Start Date End Date Taking? Authorizing Provider  acetaminophen (TYLENOL) 500 MG tablet Take 500 mg by mouth 3 (three) times daily as needed (for pain or headaches).    Yes [provider]  allopurinol (ZYLOPRIM) 100 MG tablet Take 100 mg by mouth every morning.   Yes [provider]  amLODipine (NORVASC) 10 MG tablet Take 1 tablet (10 mg total) by mouth daily. 01/12/15  Yes Copland, Gay Filler, MD  aspirin EC 325 MG EC tablet Take 1 tablet (325 mg total) by mouth daily. 04/04/17  Yes Vann, Jessica U, DO  atorvastatin (LIPITOR) 80 MG tablet Take 1 tablet (80 mg total) by mouth daily. 11/16/16  Yes Eileen Stanford, PA-C  carvedilol (COREG) 25 MG tablet Take 2 tablets (50 mg total) by mouth 2 (two) times daily. 02/19/17  Yes End, Harrell Gave, MD  Cholecalciferol 2000 units TABS Take 2,000 Units by mouth every morning.   Yes [provider]  clopidogrel (PLAVIX) 75 MG tablet TAKE ONE TABLET BY MOUTH ONCE DAILY WITH BREAKFAST Patient taking differently: Take 75 mg by mouth once a day with breakfast 12/25/16  Yes End, Harrell Gave, MD  diphenhydrAMINE (BENADRYL) 25 MG tablet Take 25 mg by mouth every 6 (six) hours as needed for allergies or sleep.    Yes [provider]  diphenhydramine-acetaminophen (TYLENOL PM) 25-500 MG TABS tablet Take 2 tablets by mouth at bedtime.   Yes [provider]  furosemide (LASIX) 40 MG tablet TAKE 1 TABLET BY MOUTH ONCE DAILY Patient taking differently: TAKE 40mg  BY MOUTH once daily  in the morning 06/26/17  Yes End, Harrell Gave, MD  gabapentin (NEURONTIN) 300 MG capsule Take 1 capsule (300 mg total) by mouth 2 (two) times daily. Patient taking differently: Take 300 mg by mouth 3 (three) times daily.  04/03/17  Yes Eulogio Bear U, DO  hydrALAZINE (APRESOLINE) 50 MG tablet Take 1 tablet (50 mg total) by mouth 3 (three) times daily. 12/20/16  Yes End, Harrell Gave, MD  isosorbide mononitrate (IMDUR) 120 MG 24 hr tablet Take 1 tablet (120 mg total) by mouth 2 (two) times daily. 04/03/17  Yes Geradine Girt, DO  Multiple Vitamin (MULTIVITAMIN WITH MINERALS) TABS tablet Take 1 tablet by mouth daily.   Yes [provider]  nateglinide (STARLIX) 120 MG tablet Take 120 mg 3 (three) times daily with meals by mouth.  Yes [provider]  nitroGLYCERIN (NITROLINGUAL) 0.4 MG/SPRAY spray PLACE ONE SPRAY UNDER THE TONGUE EVERY 5 MINUTES FOR 3 DOSES AS NEEDED FOR CHEST PAIN 04/02/17  Yes End, Harrell Gave, MD  Omega-3 Fatty Acids (FISH OIL) 1000 MG CAPS Take 2,000 mg by mouth 2 (two) times daily.   Yes [provider]  pantoprazole (PROTONIX) 40 MG tablet Take 40 mg by mouth daily.    Yes [provider]  ranolazine (RANEXA) 500 MG 12 hr tablet Take 1 tablet (500 mg total) by mouth 2 (two) times daily. 03/29/17  Yes End, Harrell Gave, MD  traMADol (ULTRAM) 50 MG tablet Take 50 mg by mouth every 8 (eight) hours as needed for moderate pain.    Yes [provider]  vitamin E 400 UNIT capsule Take 400 Units by mouth every evening.    Yes [provider]  linagliptin (TRADJENTA) 5 MG TABS tablet Take 1 tablet (5 mg total) by mouth daily. Patient not taking: Reported on 07/22/2017 04/03/17   Geradine Girt, DO  traMADol (ULTRAM) 50 MG tablet Take 1 tablet (50 mg total) by mouth every 6 (six) hours as needed. 08/09/17   Gareth Morgan, MD    Family History Family History  Problem Relation Age of Onset  . Lung cancer Mother   . Anemia Mother   .  Polycystic kidney disease Mother   . Diabetes Father   . Heart attack Father 75       Died suddenly  . Heart failure Father   . Hyperlipidemia Father   . Hypertension Father   . Kidney failure Sister 5       Died  . Heart attack Sister   . Hypertension Sister   . Diabetes Sister   . Diabetes Brother   . Polycystic kidney disease Brother   . Diabetes Sister     Social History Social History   Tobacco Use  . Smoking status: Former Smoker    Types: Cigarettes  . Smokeless tobacco: Never Used  . Tobacco comment: smoked some as a teenager (high school) not even a pack a week  Substance Use Topics  . Alcohol use: No    Alcohol/week: 0.0 oz    Comment: 09/15/2012 "drank a little when I was young"  . Drug use: No     Allergies   Ciprocin-fluocin-procin [fluocinolone acetonide]; Clarithromycin; Cleocin [clindamycin hcl]; Glimepiride [amaryl]; and Colchicine   Review of Systems Review of Systems  Constitutional: Negative for fever.  HENT: Negative for sore throat.   Eyes: Negative for visual disturbance.  Respiratory: Positive for shortness of breath. Negative for cough.   Cardiovascular: Positive for chest pain.  Gastrointestinal: Negative for abdominal pain, nausea and vomiting.  Genitourinary: Negative for difficulty urinating.  Musculoskeletal: Positive for arthralgias and gait problem. Negative for back pain and neck stiffness.  Skin: Negative for rash.  Neurological: Negative for syncope and headaches.     Physical Exam Updated Vital Signs BP 134/78   Pulse 77   Temp 98.1 F (36.7 C) (Oral)   Resp 20   Ht 5\' 5"  (1.651 m)   Wt 104.8 kg (231 lb)   SpO2 97%   BMI 38.44 kg/m   Physical Exam  Constitutional: He is oriented to person, place, and time. He appears well-developed and well-nourished. No distress.  HENT:  Head: Normocephalic and atraumatic.  Eyes: Conjunctivae and EOM are normal.  Neck: Normal range of motion.  Cardiovascular: Normal rate,  regular rhythm, normal heart sounds and intact  distal pulses. Exam reveals no gallop and no friction rub.  No murmur heard. Pulmonary/Chest: Effort normal and breath sounds normal. No respiratory distress. He has no wheezes. He has no rales.  Abdominal: Soft. He exhibits no distension. There is no tenderness. There is no guarding.  Musculoskeletal: He exhibits tenderness (plantar surface lateral right foot). He exhibits no edema.  No erythema of foot, normal pulses bilaterally   Neurological: He is alert and oriented to person, place, and time.  Skin: Skin is warm and dry. He is not diaphoretic.  Nursing note and vitals reviewed.    ED Treatments / Results  Labs (all labs ordered are listed, but only abnormal results are displayed) Labs Reviewed  CBC - Abnormal; Notable for the following components:      Result Value   RBC 3.89 (*)    Hemoglobin 10.9 (*)    HCT 33.2 (*)    All other components within normal limits  BASIC METABOLIC PANEL - Abnormal; Notable for the following components:   Glucose, Bld 185 (*)    BUN 44 (*)    Creatinine, Ser 3.59 (*)    GFR calc non Af Amer 17 (*)    GFR calc Af Amer 20 (*)    All other components within normal limits  I-STAT TROPONIN, ED  I-STAT TROPONIN, ED    EKG  EKG Interpretation  Date/Time:  Friday August 09 2017 09:26:09 EST Ventricular Rate:  72 PR Interval:  150 QRS Duration: 112 QT Interval:  386 QTC Calculation: 422 R Axis:   3 Text Interpretation:  Normal sinus rhythm Anterior infarct , age undetermined Abnormal ECG No significant change since last tracing Confirmed by Gareth Morgan [phone]) on 08/09/2017 10:08:46 AM       Radiology Dg Chest 2 View  Result Date: 08/09/2017 CLINICAL DATA:  Chest pain EXAM: CHEST  2 VIEW COMPARISON:  03/31/2017 FINDINGS: Prior CABG. Heart is mildly enlarged. Low lung volumes. No confluent opacities or effusions. No acute bony abnormality. IMPRESSION: Borderline cardiomegaly.  Low  lung volumes.  No active disease. Electronically Signed   By: Rolm Baptise M.D.   On: 08/09/2017 09:57   Dg Foot Complete Right  Result Date: 08/09/2017 CLINICAL DATA:  Right foot pain . EXAM: RIGHT FOOT COMPLETE - 3+ VIEW COMPARISON:  No recent prior. FINDINGS: No acute bony or joint abnormality identified. No evidence of fracture dislocation. IMPRESSION: No acute abnormality . Electronically Signed   By: Marcello Moores  Register   On: 08/09/2017 10:54    Procedures Procedures (including critical care time)  Medications Ordered in ED Medications  traMADol (ULTRAM) tablet 50 mg (50 mg Oral Given 08/09/17 1057)     Initial Impression / Assessment and Plan / ED Course  I have reviewed the triage vital signs and the nursing notes.  Pertinent labs & imaging results that were available during my care of the patient were reviewed by me and considered in my medical decision making (see chart for details).     History 56 year old male with history of hypertension, ischemic cardiomyopathy, diabetes, hyperlipidemia, coronary artery disease, gout presents with right foot pain.  Patient was initially seen in fast track, however had reported chest pain on exertion with radiation to the left arm that was occurred while walking up a hill piror to arrival and continuing left arm pain on arrival and was transferred to main ED.  EKG without significant findings. Delta troponins are negative.  Patient was given nitroglycerin in the emergency department with complete  resolution of prior arm pain.  Hx not consistent with PE or dissection.  Discussed with Dr. Sallyanne Kuster of Cardiology patient's presentation. Patient more concerned regarding foot pain, has history of stable angina and is on full medical treatment including ranexa.  Given chest and arm pain free now in ED, had symptoms related to significant exertion, is on full medical treatment--discussed with Dr. Sallyanne Kuster and patient and we feel comfortable with discharge  and outpatient follow up. Discussed return precautions.  Regarding foot pain-no sign of septic arthritis, pain localized to bottom of foot, doubt DVT. No significant erythema or swelling to suggest gout. Reports wearing boots over weekend, history of pain and location most consistent with plantar fasciitis. Recommend supportive comfortable shoes, follow up with podiatry, and exercises.  Reports pain severe, cannot take NSAIDs due to CKD. Reviewed in [place] drug database, has not had rx in 30 days.  Given short rx for tramadol and discussed risks. Patient discharged in stable condition with understanding of reasons to return.    Final Clinical Impressions(s) / ED Diagnoses   Final diagnoses:  Stable angina ([place])  Right foot pain  Plantar fasciitis    ED Discharge Orders        Ordered    traMADol (ULTRAM) 50 MG tablet  Every 6 hours PRN     08/09/17 1505       Gareth Morgan, MD 08/09/17 2138

## 2017-08-09 NOTE — ED Triage Notes (Signed)
Pt reports hx of gout in right foot, now has pain to left foot also and left arm. Takes tramadol but has run out of it.

## 2017-08-09 NOTE — ED Notes (Signed)
Pt back from X-ray.  

## 2017-08-09 NOTE — ED Notes (Signed)
Pt gone to Xray 

## 2017-08-09 NOTE — ED Notes (Signed)
Patient transported to X-ray 

## 2017-08-09 NOTE — ED Provider Notes (Signed)
MSE was initiated and I personally evaluated the patient and placed orders (if any) at  9:22 AM on August 09, 2017.  Matthew ROSENWALD Sr. is a 56 y.o. male with a history of CAD s/p PCI, HTN, diastolic heart failure, DM, GRD, gout, CKD, and sleep apnea, who presents complaining of pain in his right foot, concern for exacerbation of his gout. Patient also reports this morning while he was getting ready to come into the hospital he had a 30 minute episode of left-sided chest pain, which radiated down the left arm, patient feels this was related to exertion. Patient reports this started at approximately 8 AM, lasted 30 minutes and was relieved by nitroglycerin, but patient reports persistent pain down the left arm. Reports pain does not radiate anywhere else. Patient denies associated shortness of breath. Patient reports typically when he has similar anginal episodes at home, nitroglycerin relieves all of his pain, he does not typically continue to have pain radiating down the left arm. Patient reports he had a routine follow-up with his cardiologist last week, and all was well.  Patient does report some tingling on the medial side of his left foot, reports he's experienced similar symptoms usually on the outside of his foot, this just started this morning, no pain in this foot. Patient also reports severe pain in the right foot that starts in the heel and radiates up his leg when he steps down on it, reports this feels kind of similar to previous scalp pain. Patient wore a postop shoe to the ED today because he reports it was too painful to put his shoe on. Patient typically takes tramadol for this pain, but has been out for the past 2 days since this episode started. Has not been able to get to the pharmacy to pick up his refill.   Given chest and left arm pain, but has not been completely resolved with nitroglycerin, patient will need to be worked up for this chest pain. On exam patient is mildly  hypertensive, but vitals are otherwise normal. EKG, chest x-ray, troponin, CBC and BMP ordered for chest pain workup.  The patient appears stable so that the remainder of the MSE may be completed by Dr. Gareth Morgan, discussed this patient with her prior to transition of care.   Jacqlyn Larsen, PA-C 08/09/17 1323    Gareth Morgan, MD 08/11/17 0830

## 2017-08-11 ENCOUNTER — Other Ambulatory Visit: Payer: Self-pay

## 2017-08-11 ENCOUNTER — Encounter (HOSPITAL_COMMUNITY): Payer: Self-pay | Admitting: Emergency Medicine

## 2017-08-11 ENCOUNTER — Emergency Department (HOSPITAL_COMMUNITY)
Admission: EM | Admit: 2017-08-11 | Discharge: 2017-08-11 | Disposition: A | Discharge status: Home / Self Care | Payer: Medicare Other | Attending: Emergency Medicine | Admitting: Emergency Medicine

## 2017-08-11 DIAGNOSIS — M109 Gout, unspecified: Secondary | ICD-10-CM

## 2017-08-11 DIAGNOSIS — M79671 Pain in right foot: Secondary | ICD-10-CM | POA: Insufficient documentation

## 2017-08-11 DIAGNOSIS — E1122 Type 2 diabetes mellitus with diabetic chronic kidney disease: Secondary | ICD-10-CM | POA: Diagnosis not present

## 2017-08-11 DIAGNOSIS — I2510 Atherosclerotic heart disease of native coronary artery without angina pectoris: Secondary | ICD-10-CM | POA: Diagnosis not present

## 2017-08-11 DIAGNOSIS — Z87891 Personal history of nicotine dependence: Secondary | ICD-10-CM | POA: Insufficient documentation

## 2017-08-11 DIAGNOSIS — I5032 Chronic diastolic (congestive) heart failure: Secondary | ICD-10-CM | POA: Insufficient documentation

## 2017-08-11 DIAGNOSIS — Z951 Presence of aortocoronary bypass graft: Secondary | ICD-10-CM | POA: Insufficient documentation

## 2017-08-11 DIAGNOSIS — Z7902 Long term (current) use of antithrombotics/antiplatelets: Secondary | ICD-10-CM | POA: Insufficient documentation

## 2017-08-11 DIAGNOSIS — I130 Hypertensive heart and chronic kidney disease with heart failure and stage 1 through stage 4 chronic kidney disease, or unspecified chronic kidney disease: Secondary | ICD-10-CM | POA: Diagnosis not present

## 2017-08-11 DIAGNOSIS — Z7982 Long term (current) use of aspirin: Secondary | ICD-10-CM | POA: Insufficient documentation

## 2017-08-11 DIAGNOSIS — I252 Old myocardial infarction: Secondary | ICD-10-CM | POA: Insufficient documentation

## 2017-08-11 DIAGNOSIS — N183 Chronic kidney disease, stage 3 (moderate): Secondary | ICD-10-CM | POA: Insufficient documentation

## 2017-08-11 MED ORDER — HYDROCODONE-ACETAMINOPHEN 5-325 MG PO TABS: 1.0000 | ORAL_TABLET | Freq: Four times a day (QID) | ORAL | 0 refills | Status: DC | PRN

## 2017-08-11 MED ORDER — HYDROCODONE-ACETAMINOPHEN 5-325 MG PO TABS
1.0000 | ORAL_TABLET | Freq: Once | ORAL | Status: AC
  Administered 2017-08-11: 1 via ORAL
  Filled 2017-08-11: qty 1

## 2017-08-11 NOTE — ED Provider Notes (Signed)
[facility] EMERGENCY DEPARTMENT Provider Note   CSN: [id number] Arrival date & time: 08/11/17  8676     History   Chief Complaint Chief Complaint  Patient presents with  . Foot Pain    HPI Matthew Rowe. is a 56 y.o. male with past medical history of chronic kidney disease, gout, CAD, diabetes, hypertension who presents today with worsening pain to right foot.  Patient was seen in the ED on 08/09/17 for same symptoms.  At that time x-rays were unremarkable.  Patient had more tenderness to the plantar aspect of the right foot and was diagnosed with plantar fasciitis.  He was given a prescription for tramadol and discharged home with instructions to follow-up with podiatry.  Patient comes the emergency department today because of continued and persistent pain.  He states that the tramadol has not improved his pain.  He states that he has been attempting to put ice on the foot but no elevating the foot.  Patient states that he has noted some worsening swelling to the dorsal aspect of the foot but wife states that it is at the same level that it was 2 days ago when he was initially seen in the ED.  Patient states that the symptoms are consistent with his gout.  He reports that the pain is worse at the base of the first toe.  He reports difficulty walking secondary to pain.  He has not taken any other medications.  He denies any new preceding trauma, injury, fall.  Patient denies any fevers, calf tenderness, leg swelling, numbness/weakness.  The history is provided by the patient.    Past Medical History:  Diagnosis Date  . Anginal pain ([place])   . CKD (chronic kidney disease), stage III ([place])   . Coronary artery disease    a. INF MI 1999: PCI of RCA;  b.  extensive stenting of LAD;  c. s/p CABG with RIMA->RCA in 2006;  d. Stable, Low risk MV 05/2012; e. 09/2012 Cath: stable anatomy->med Rx. f. Abnl nuc 11/2013 -> med rx; g. 11/2013 Cath/PCI: LM 30d, LAD 50ost, 20/50isr,  100d, LCX 40-50ost, OM2 50ost, RI 70-80ost sup branch, RCA 20p, 90/60d s/p DES, PDA 60p, RIMA->RCA ok.  d. 11/2016: nuc with EF 35%, no reversible ischem  . Diastolic heart failure ([place])   . DM2 (diabetes mellitus, type 2) ([place])   . Dyslipidemia   . GERD (gastroesophageal reflux disease)   . History of gout   . Hx of echocardiogram    a. Echo 3/12: mild LVH, EF 55-60%, trivial AI  . Hypertensive heart disease   . Ischemic cardiomyopathy   . Myocardial infarction [facility] 1999; 2002; 2003; 2006; ~ 2008  . OSA on CPAP   . Polycystic kidney disease     Patient Active Problem List   Diagnosis Date Noted  . Chronic kidney disease, stage IV (severe) ([place]) 07/22/2017  . Hyperkalemia 04/01/2017  . Non-ST elevation (NSTEMI) myocardial infarction ([place]) 04/01/2017  . Chest pain 04/01/2017  . Hx of CABG   . Ischemic cardiomyopathy 02/21/2017  . Chronic diastolic heart failure ([place]) 02/08/2017  . Acute on chronic diastolic CHF (congestive heart failure) ([place]) 02/08/2017  . Mixed hyperlipidemia   . Stage 3 chronic kidney disease ([place])   . Chronic kidney disease, stage III (moderate) ([facility])   . GERD (gastroesophageal reflux disease) 05/08/2015  . Gout 05/08/2015  . MI (myocardial infarction) ([place]) 04/19/2015  . Coronary artery disease   . Hypertensive heart disease   .  Precordial chest pain 11/27/2013  . Financial difficulty 03/13/2013  . Medically noncompliant 03/13/2013  . Type 2 diabetes mellitus without complications ([place])   . Obesity-BMI 42 11/27/2012  . Polycystic kidney disease 09/15/2012  . OSA on CPAP 09/15/2012  . Essential hypertension 03/27/2011  . Hyperlipidemia associated with type 2 diabetes mellitus [facility]     Past Surgical History:  Procedure Laterality Date  . CARDIAC CATHETERIZATION  2012  . CARDIAC CATHETERIZATION  2013  . CARDIAC CATHETERIZATION  2014   LHC (1/14): total occlusion of distal LAD, diffuse disease in ramus, 70% proximal LCx, 50% proximal and mid RCA, 50%  distal RCA, patent RIMA-RCA.  Marland Kitchen CORONARY ANGIOPLASTY WITH STENT PLACEMENT  1999 / 2002 / 2004 / 2006?  . CORONARY ARTERY BYPASS GRAFT  2006   CABG X?; in [place]  . CYSTECTOMY  1990's   off face  . LEFT HEART CATHETERIZATION WITH CORONARY/GRAFT ANGIOGRAM N/A 09/16/2012   Procedure: LEFT HEART CATHETERIZATION WITH [name];  Surgeon: [name], MD;  Location: [facility] C[facility] LAB;  Service: Cardiovascular;  Laterality: N/A;  . LEFT HEART CATHETERIZATION WITH CORONARY/GRAFT ANGIOGRAM N/A 12/02/2013   Procedure: LEFT HEART CATHETERIZATION WITH [name];  Surgeon: [name], MD;  Location: [facility] CATH LAB;  Service: Cardiovascular;  Laterality: N/A;  . PERCUTANEOUS CORONARY STENT INTERVENTION (PCI-S) N/A 12/03/2013   Procedure: PERCUTANEOUS CORONARY STENT INTERVENTION (PCI-S);  Surgeon: [name], MD;  Location[facility] C[facility] LAB;  Service: Cardiovascular;  Laterality: N/A;       Home Medications    Prior to Admission medications   Medication Sig Start Date End Date Taking? Authorizing Provider  acetaminophen (TYLENOL) 500 MG tablet Take 500 mg by mouth 3 (three) times daily as needed (for pain or headaches).     [provider]  allopurinol (ZYLOPRIM) 100 MG tablet Take 100 mg by mouth every morning.    [provider]  amLODipine (NORVASC) 10 MG tablet Take 1 tablet (10 mg total) by mouth daily. 01/12/15   Copland, Gay Filler, MD  aspirin EC 325 MG EC tablet Take 1 tablet (325 mg total) by mouth daily. 04/04/17   Geradine Girt, DO  atorvastatin (LIPITOR) 80 MG tablet Take 1 tablet (80 mg total) by mouth daily. 11/16/16   Eileen Stanford, PA-C  carvedilol (COREG) 25 MG tablet Take 2 tablets (50 mg total) by mouth 2 (two) times daily. 02/19/17   End, Harrell Gave, MD  Cholecalciferol 2000 units TABS Take 2,000 Units by mouth every morning.    [provider]  clopidogrel (PLAVIX) 75 MG tablet TAKE ONE TABLET BY MOUTH ONCE DAILY WITH  BREAKFAST Patient taking differently: Take 75 mg by mouth once a day with breakfast 12/25/16   End, Harrell Gave, MD  diphenhydrAMINE (BENADRYL) 25 MG tablet Take 25 mg by mouth every 6 (six) hours as needed for allergies or sleep.     [provider]  diphenhydramine-acetaminophen (TYLENOL PM) 25-500 MG TABS tablet Take 2 tablets by mouth at bedtime.    [provider]  furosemide (LASIX) 40 MG tablet TAKE 1 TABLET BY MOUTH ONCE DAILY Patient taking differently: TAKE 40mg  BY MOUTH once daily in the morning 06/26/17   End, Harrell Gave, MD  gabapentin (NEURONTIN) 300 MG capsule Take 1 capsule (300 mg total) by mouth 2 (two) times daily. Patient taking differently: Take 300 mg by mouth 3 (three) times daily.  04/03/17   Geradine Girt, DO  hydrALAZINE (APRESOLINE) 50 MG tablet Take 1 tablet (50  mg total) by mouth 3 (three) times daily. 12/20/16   End, Harrell Gave, MD  HYDROcodone-acetaminophen (NORCO/VICODIN) 5-325 MG tablet Take 1 tablet by mouth every 6 (six) hours as needed. 08/11/17   Volanda Napoleon, PA-C  isosorbide mononitrate (IMDUR) 120 MG 24 hr tablet Take 1 tablet (120 mg total) by mouth 2 (two) times daily. 04/03/17   Geradine Girt, DO  linagliptin (TRADJENTA) 5 MG TABS tablet Take 1 tablet (5 mg total) by mouth daily. Patient not taking: Reported on 07/22/2017 04/03/17   Geradine Girt, DO  Multiple Vitamin (MULTIVITAMIN WITH MINERALS) TABS tablet Take 1 tablet by mouth daily.    [provider]  nateglinide (STARLIX) 120 MG tablet Take 120 mg 3 (three) times daily with meals by mouth.    [provider]  nitroGLYCERIN (NITROLINGUAL) 0.4 MG/SPRAY spray PLACE ONE SPRAY UNDER THE TONGUE EVERY 5 MINUTES FOR 3 DOSES AS NEEDED FOR CHEST PAIN 04/02/17   End, Harrell Gave, MD  Omega-3 Fatty Acids (FISH OIL) 1000 MG CAPS Take 2,000 mg by mouth 2 (two) times daily.    [provider]  pantoprazole (PROTONIX) 40 MG tablet Take 40 mg by mouth daily.      [provider]  ranolazine (RANEXA) 500 MG 12 hr tablet Take 1 tablet (500 mg total) by mouth 2 (two) times daily. 03/29/17   End, Harrell Gave, MD  traMADol (ULTRAM) 50 MG tablet Take 50 mg by mouth every 8 (eight) hours as needed for moderate pain.     [provider]  traMADol (ULTRAM) 50 MG tablet Take 1 tablet (50 mg total) by mouth every 6 (six) hours as needed. 08/09/17   Gareth Morgan, MD  vitamin E 400 UNIT capsule Take 400 Units by mouth every evening.     [provider]    Family History Family History  Problem Relation Age of Onset  . Lung cancer Mother   . Anemia Mother   . Polycystic kidney disease Mother   . Diabetes Father   . Heart attack Father 81       Died suddenly  . Heart failure Father   . Hyperlipidemia Father   . Hypertension Father   . Kidney failure Sister 5       Died  . Heart attack Sister   . Hypertension Sister   . Diabetes Sister   . Diabetes Brother   . Polycystic kidney disease Brother   . Diabetes Sister     Social History Social History   Tobacco Use  . Smoking status: Former Smoker    Types: Cigarettes  . Smokeless tobacco: Never Used  . Tobacco comment: smoked some as a teenager (high school) not even a pack a week  Substance Use Topics  . Alcohol use: No    Alcohol/week: 0.0 oz    Comment: 09/15/2012 "drank a little when I was young"  . Drug use: No     Allergies   Ciprocin-fluocin-procin [fluocinolone acetonide]; Clarithromycin; Cleocin [clindamycin hcl]; Glimepiride [amaryl]; and Colchicine   Review of Systems Review of Systems  Constitutional: Negative for fever.  Respiratory: Negative for shortness of breath.   Cardiovascular: Negative for leg swelling.  Musculoskeletal: Positive for joint swelling.       Right foot pain  Neurological: Negative for weakness and numbness.     Physical Exam Updated Vital Signs BP 139/70 (BP Location: Right Arm)   Pulse 77   Temp 97.7 F (36.5 C)  (Oral)   Resp 17  Ht 5' 5.5" (1.664 m)   Wt 106.6 kg (235 lb)   SpO2 99%   BMI 38.51 kg/m   Physical Exam  Constitutional: He appears well-developed and well-nourished.  Appears uncomfortable but no acute distress   HENT:  Head: Normocephalic and atraumatic.  Eyes: Conjunctivae and EOM are normal. Right eye exhibits no discharge. Left eye exhibits no discharge. No scleral icterus.  Cardiovascular:  Pulses:      Dorsalis pedis pulses are 2+ on the right side, and 2+ on the left side.  Pulmonary/Chest: Effort normal.  Musculoskeletal:  Tenderness palpation to the base of the right first toe.  There is some minimal erythema noted.  Tenderness palpation the plantar surface of the foot that extends over to the dorsal aspect of the foot.  There is some soft tissue swelling noted to the dorsal aspect of the foot.  No overlying warmth, erythema noted to the dorsal aspect of the foot.  Patient able to move all 5 digits of right foot without difficulty. Plantarflexion and dorsiflexion of right foot are intact without difficulty.  No calf tenderness to palpation of right leg.  No swelling of the right proximal or distal leg.  No abnormalities of the left lower extremity.  Neurological: He is alert.  Skin: Skin is warm and dry. Capillary refill takes less than 2 seconds.  RLE is not dusky in appearance or cool to touch.  Psychiatric: He has a normal mood and affect. His speech is normal and behavior is normal.  Nursing note and vitals reviewed.    ED Treatments / Results  Labs (all labs ordered are listed, but only abnormal results are displayed) Labs Reviewed - No data to display  EKG  EKG Interpretation None       Radiology No results found.  Procedures Procedures (including critical care time)  Medications Ordered in ED Medications  HYDROcodone-acetaminophen (NORCO/VICODIN) 5-325 MG per tablet 1 tablet (1 tablet Oral Given 08/11/17 0934)     Initial Impression / Assessment  and Plan / ED Course  I have reviewed the triage vital signs and the nursing notes.  Pertinent labs & imaging results that were available during my care of the patient were reviewed by me and considered in my medical decision making (see chart for details).     56 year old male who presents today with persistent right foot pain.  Seen in the ED 2 days ago for same pain.  Prescribed tramadol for symptoms with no relief of pain.  No fevers, numbness/weakness. Patient is afebrile, non-toxic appearing, sitting comfortably on examination table. Vital signs reviewed and stable.  Physical exam shows soft tissue swelling noted to the dorsal aspect of the foot.  Tenderness palpation to the plantar, dorsal foot and to the base of the right first toe.  There is some mild erythema noted to the base of the right first toe.  Symptoms appear to be consistent with gout.  History/physical exam are not concerning for DVT or septic arthritis.  Patient does have a walking appointment with the [facility] tomorrow and is scheduled to see podiatry.  We will plan to give a short course of pain medication to help with pain.  Instructed patient on cautious use given history of chronic kidney disease. Discussed patient with Dr. Francia Greaves who independently evaluated patient.  Agreeable to plan. Patient had ample opportunity for questions and discussion. All patient's questions were answered with full understanding. Strict return precautions discussed. Patient expresses understanding and agreement to plan.  Final Clinical Impressions(s) / ED Diagnoses   Final diagnoses:  Foot pain, right  Gout of right foot, unspecified cause, unspecified chronicity    ED Discharge Orders        Ordered    HYDROcodone-acetaminophen (NORCO/VICODIN) 5-325 MG tablet  Every 6 hours PRN     08/11/17 1026       Desma Mcgregor 08/11/17 1123    Valarie Merino, MD 08/11/17 1623

## 2017-08-11 NOTE — Discharge Instructions (Signed)
Take pain medication as directed.  As we discussed, apply ice and elevate the foot.  Follow-up with the [facility] doctor as previously scheduled.  Return the emergency department for any worsening pain, redness, swelling of the leg, chest pain, difficulty breathing.

## 2017-08-11 NOTE — ED Notes (Signed)
Family at bedside.  Pt is sitting in a wheelchair.  Pt reports he will go to the [place] [facility] tomorrow for f/u.  He reports they have a podiatrist there

## 2017-08-11 NOTE — ED Triage Notes (Signed)
Reports being seen on Friday for the same.  C/o pain in right foot that started on Thursday.  Worse with any weight bearing. Back today due to pain being unbearable.

## 2017-08-12 ENCOUNTER — Telehealth: Payer: Self-pay | Admitting: *Deleted

## 2017-08-12 NOTE — Telephone Encounter (Signed)
-----   Message from Nelva Bush, MD sent at 08/11/2017  2:41 PM EST ----- Regarding: ED F/U [facility]etter[place],  It looks like Mr. Pickrell was in the ED for chest and foot pain recently. Can you schedule him to see an APP in the next week or two for reevaluation of his chest pain? Thanks.  Gerald Stabs

## 2017-08-12 NOTE — Telephone Encounter (Signed)
I spoke with patient, he has been scheduled to see Melina Copa, PA 08/26/17 at 11AM.

## 2017-08-19 ENCOUNTER — Ambulatory Visit: Payer: Medicare Other | Admitting: Physician Assistant

## 2017-08-26 ENCOUNTER — Ambulatory Visit (INDEPENDENT_AMBULATORY_CARE_PROVIDER_SITE_OTHER): Payer: Medicare Other | Admitting: Physician Assistant

## 2017-08-26 ENCOUNTER — Encounter: Payer: Self-pay | Admitting: Physician Assistant

## 2017-08-26 VITALS — BP 130/62 | HR 83 | Ht 65.5 in | Wt 231.4 lb

## 2017-08-26 DIAGNOSIS — N184 Chronic kidney disease, stage 4 (severe): Secondary | ICD-10-CM | POA: Diagnosis not present

## 2017-08-26 DIAGNOSIS — I25118 Atherosclerotic heart disease of native coronary artery with other forms of angina pectoris: Secondary | ICD-10-CM | POA: Diagnosis not present

## 2017-08-26 DIAGNOSIS — I5042 Chronic combined systolic (congestive) and diastolic (congestive) heart failure: Secondary | ICD-10-CM

## 2017-08-26 DIAGNOSIS — I10 Essential (primary) hypertension: Secondary | ICD-10-CM

## 2017-08-26 MED ORDER — ISOSORBIDE MONONITRATE ER 120 MG PO TB24: 120.0000 mg | ORAL_TABLET | Freq: Two times a day (BID) | ORAL | Status: DC

## 2017-08-26 MED ORDER — ATORVASTATIN CALCIUM 80 MG PO TABS: 80.0000 mg | ORAL_TABLET | Freq: Every day | ORAL | 1 refills | Status: AC

## 2017-08-26 MED ORDER — FUROSEMIDE 40 MG PO TABS: 40.0000 mg | ORAL_TABLET | Freq: Two times a day (BID) | ORAL | 3 refills | Status: DC

## 2017-08-26 MED ORDER — CARVEDILOL 25 MG PO TABS: 50.0000 mg | ORAL_TABLET | Freq: Two times a day (BID) | ORAL | 9 refills | Status: DC

## 2017-08-26 NOTE — Patient Instructions (Addendum)
Medication Instructions:  Your physician recommends that you continue on your current medications as directed. Please refer to the Current Medication list given to you today.   Labwork: None ordered  Testing/Procedures: None ordered  Follow-Up: Your physician recommends that you schedule a follow-up appointment in: [place] 10/2017   Any Other Special Instructions Will Be Listed Below (If Applicable).    Low-Sodium Eating Plan Sodium, which is an element that makes up salt, helps you maintain a healthy balance of fluids in your body. Too much sodium can increase your blood pressure and cause fluid and waste to be held in your body. Your health care provider or dietitian may recommend following this plan if you have high blood pressure (hypertension), kidney disease, liver disease, or heart failure. Eating less sodium can help lower your blood pressure, reduce swelling, and protect your heart, liver, and kidneys. What are tips for following this plan? General guidelines  Most people on this plan should limit their sodium intake to 1,500-2,000 mg (milligrams) of sodium each day. Reading food labels  The Nutrition Facts label lists the amount of sodium in one serving of the food. If you eat more than one serving, you must multiply the listed amount of sodium by the number of servings.  Choose foods with less than 140 mg of sodium per serving.  Avoid foods with 300 mg of sodium or more per serving. Shopping  Look for lower-sodium products, often labeled as "low-sodium" or "no salt added."  Always check the sodium content even if foods are labeled as "unsalted" or "no salt added".  Buy fresh foods. ? Avoid canned foods and premade or frozen meals. ? Avoid canned, cured, or processed meats  Buy breads that have less than 80 mg of sodium per slice. Cooking  Eat more home-cooked food and less restaurant, buffet, and fast food.  Avoid adding salt when cooking.  Use salt-free seasonings or herbs instead of table salt or sea salt. Check with your health care provider or pharmacist before using salt substitutes.  Cook with plant-based oils, such as canola, sunflower, or olive oil. Meal planning  When eating at a restaurant, ask that your food be prepared with less salt or no salt, if possible.  Avoid foods that contain MSG (monosodium glutamate). MSG is sometimes added to [place] food, bouillon, and some canned foods. What foods are recommended? The items listed may not be a complete list. Talk with your dietitian about what dietary choices are best for you. Grains Low-sodium cereals, including oats, puffed wheat and rice, and shredded wheat. Low-sodium crackers. Unsalted rice. Unsalted pasta. Low-sodium bread. Whole-grain breads and whole-grain pasta. Vegetables Fresh or frozen vegetables. "No salt added" canned vegetables. "No salt added" tomato sauce and paste. Low-sodium or reduced-sodium tomato and vegetable juice. Fruits Fresh, frozen, or canned fruit. Fruit juice. Meats and other protein foods Fresh or frozen (no salt added) meat, poultry, seafood, and fish. Low-sodium canned tuna and salmon. Unsalted nuts. Dried peas, beans, and lentils without added salt. Unsalted canned beans. Eggs. Unsalted nut butters. Dairy Milk. Soy milk. Cheese that is naturally low in sodium, such as ricotta cheese, fresh mozzarella, or Swiss cheese Low-sodium or reduced-sodium cheese. Cream cheese. Yogurt. Fats and oils Unsalted butter. Unsalted margarine with no trans fat. Vegetable oils such as canola or olive oils. Seasonings and other foods Fresh and dried herbs and spices. Salt-free seasonings. Low-sodium mustard and ketchup. Sodium-free salad dressing. Sodium-free light mayonnaise. Fresh or refrigerated horseradish. Lemon juice.  Vinegar. Homemade, reduced-sodium, or low-sodium soups. Unsalted popcorn and pretzels. Low-salt or salt-free chips. What foods are not  recommended? The items listed may not be a complete list. Talk with your dietitian about what dietary choices are best for you. Grains Instant hot cereals. Bread stuffing, pancake, and biscuit mixes. Croutons. Seasoned rice or pasta mixes. Noodle soup cups. Boxed or frozen macaroni and cheese. Regular salted crackers. Self-rising flour. Vegetables Sauerkraut, pickled vegetables, and relishes. Olives. [place] fries. Onion rings. Regular canned vegetables (not low-sodium or reduced-sodium). Regular canned tomato sauce and paste (not low-sodium or reduced-sodium). Regular tomato and vegetable juice (not low-sodium or reduced-sodium). Frozen vegetables in sauces. Meats and other protein foods Meat or fish that is salted, canned, smoked, spiced, or pickled. Bacon, ham, sausage, hotdogs, corned beef, chipped beef, packaged lunch meats, salt pork, jerky, pickled herring, anchovies, regular canned tuna, sardines, salted nuts. Dairy Processed cheese and cheese spreads. Cheese curds. Blue cheese. Feta cheese. String cheese. Regular cottage cheese. Buttermilk. Canned milk. Fats and oils Salted butter. Regular margarine. Ghee. Bacon fat. Seasonings and other foods Onion salt, garlic salt, seasoned salt, table salt, and sea salt. Canned and packaged gravies. Worcestershire sauce. Tartar sauce. Barbecue sauce. Teriyaki sauce. Soy sauce, including reduced-sodium. Steak sauce. Fish sauce. Oyster sauce. Cocktail sauce. Horseradish that you find on the shelf. Regular ketchup and mustard. Meat flavorings and tenderizers. Bouillon cubes. Hot sauce and Tabasco sauce. Premade or packaged marinades. Premade or packaged taco seasonings. Relishes. Regular salad dressings. Salsa. Potato and tortilla chips. Corn chips and puffs. Salted popcorn and pretzels. Canned or dried soups. Pizza. Frozen entrees and pot pies. Summary  Eating less sodium can help lower your blood pressure, reduce swelling, and protect your heart, liver,  and kidneys.  Most people on this plan should limit their sodium intake to 1,500-2,000 mg (milligrams) of sodium each day.  Canned, boxed, and frozen foods are high in sodium. Restaurant foods, fast foods, and pizza are also very high in sodium. You also get sodium by adding salt to food.  Try to cook at home, eat more fresh fruits and vegetables, and eat less fast food, canned, processed, or prepared foods. This information is not intended to replace advice given to you by your health care provider. Make sure you discuss any questions you have with your health care provider. Document Released: 02/16/2002 Document Revised: 08/20/2016 Document Reviewed: 08/20/2016 Elsevier Interactive Patient Education  2017 [employer].   If you need a refill on your cardiac medications before your next appointment, please call your pharmacy.

## 2017-08-26 NOTE — Progress Notes (Addendum)
Cardiology Office Note    Date:  08/26/2017  ID:  Karie Kirks Sr., DOB 1961-07-23, MRN [id number] PCP:  Patient, No Pcp Per  Cardiologist: Dr. Saunders Revel   Chief Complaint: f/u chest pain  History of Present Illness:  Joedy Eickhoff. is a 56 y.o. 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 Last cath was in 2015 with PCI as above. He has responded best to ranolazine in the past - he was unable to afford this in the past but had been able to in more recent . He has been unable to afford cardiac rehabilitation in the past. Last nuc in 11/2016 was abnormal with large fixed defect involving the basilar, mid and apical inferior wall with extension to the true cardiac apex consistent with region of prior infarct or scarring, dilated left ventricle with associated hypokinesis and near akinesis of the inferior wall, EF 35%, managed medically in light of advancing kidney disease. Last echo 12/2016 showed EF 45-50%, Anterior, distal anteroseptal, apical and distal inferoapical akinesis suggestive of LAD territory ischemia/infarct, mild aortic stenosis, moderate LAE, mildly dilated RV with mildly reduced systolic function. He was last admitted 03/2017 with NSTEMI (troponin 1.71). He was treated with IV heparin and was continued on aggressive antianginal regimen. Cardiac catheterization was discussed with Mr. Smisek but he declined due to his chronic kidney disease. He was seen in the ED on 08/09/17 with chest pain and 08/11/17 with foot pain felt consisent with gout. At time of CP visit, his delta troponins were negative  and his symptoms resolved with SL NTG. Dr. Saunders Revel recommended clinic f/u to reassess. He follows with Dr. Justin Mend for nephrology. His last labs showed K 4.3, Cr 3.59 (recent baseline 2.9-3.1), Hgb 10.9 (similar to recent baseline).   He returns for follow-up today with his wife. Since last ED visit he has since followed up with one of Dr. Jason Nest partners at which time he reports no med changes made. He also went to the [place] who changed his gout medication to a tapering dose. He says the [place] asked him to increase Lasix to 40mg  BID for several days then drop back down to daily. He's not sure of the name of the gout medicine or the date of when he was asked to reduce the Lasix. They plan on calling to clarify. He states they also followed up his kidney function. He has f/u with Dr. Justin Mend on 09/06/17. From a heart standpoint he reports he is feeling back to baseline. He has chronic angina with higher levels of activity but has not had any recurrent discomfort with lower grade activity or at rest. He feels that the gout exacerbated his angina due to overall stress and pain.   Past Medical History:  Diagnosis Date  . Chronic anemia   . Chronic combined systolic and diastolic CHF (congestive heart failure) ([place])   . Chronic stable angina ([place])   . CKD (chronic kidney disease), stage IV ([place])   . Coronary artery disease    a. INF MI 1999: PCI of RCA;  b.  extensive stenting of  LAD;  c. s/p CABG with RIMA->RCA in 2006;  d. Stable, Low risk MV 05/2012; e. 09/2012 Cath: stable anatomy->med Rx. f. Abnl nuc 11/2013 -> med rx; g. 11/2013 Cath/PCI: s/p DES to RCA.   Marland Kitchen DM2 (diabetes mellitus, type 2) ([place])   . Dyslipidemia   . GERD (gastroesophageal reflux disease)   . History of gout   . Hypertensive heart disease   . Ischemic cardiomyopathy   . Mild aortic stenosis   . Myocardial infarction [facility]) 1999; 2002; 2003; 2006; ~ 2008  . OSA on CPAP   . Polycystic kidney disease     Past Surgical History:  Procedure  Laterality Date  . CARDIAC CATHETERIZATION  2012  . CARDIAC CATHETERIZATION  2013  . CARDIAC CATHETERIZATION  2014   LHC (1/14): total occlusion of distal LAD, diffuse disease in ramus, 70% proximal LCx, 50% proximal and mid RCA, 50% distal RCA, patent RIMA-RCA.  Marland Kitchen CORONARY ANGIOPLASTY WITH STENT PLACEMENT  1999 / 2002 / 2004 / 2006?  . CORONARY ARTERY BYPASS GRAFT  2006   CABG X?; in [place]  . CYSTECTOMY  1990's   off face  . LEFT HEART CATHETERIZATION WITH CORONARY/GRAFT ANGIOGRAM N/A 09/16/2012   Procedure: LEFT HEART CATHETERIZATION WITH [name];  Surgeon: [name], MD;  Location: [facility] CATH LAB;  Service: Cardiovascular;  Laterality: N/A;  . LEFT HEART CATHETERIZATION WITH CORONARY/GRAFT ANGIOGRAM N/A 12/02/2013   Procedure: LEFT HEART CATHETERIZATION WITH [name];  Surgeon: [name], MD;  Location: [place] CATH LAB;  Service: Cardiovascular;  Laterality: N/A;  . PERCUTANEOUS CORONARY STENT INTERVENTION (PCI-S) N/A 12/03/2013   Procedure: PERCUTANEOUS CORONARY STENT INTERVENTION (PCI-S);  Surgeon: [name], MD;  Location: [facility] CATH LAB;  Service: Cardiovascular;  Laterality: N/A;    Current Medications: Current Meds  Medication Sig  . acetaminophen (TYLENOL) 500 MG tablet Take 500 mg by mouth 3 (three) times daily as needed (for pain or headaches).   Marland Kitchen allopurinol (ZYLOPRIM) 100 MG tablet Take 100 mg by mouth every morning.  Marland Kitchen amLODipine (NORVASC) 10 MG tablet Take 1 tablet (10 mg total) by mouth daily.  Marland Kitchen aspirin EC 325 MG EC tablet Take 1 tablet (325 mg total) by mouth daily.  Marland Kitchen atorvastatin (LIPITOR) 80 MG tablet Take 1 tablet (80 mg total) by mouth daily.  . carvedilol (COREG) 25 MG tablet Take 2 tablets (50 mg total) by mouth 2 (two) times daily.  . Cholecalciferol 2000 units TABS Take 2,000 Units by mouth every morning.  . clopidogrel (PLAVIX) 75 MG tablet TAKE ONE TABLET BY MOUTH ONCE DAILY WITH BREAKFAST (Patient taking  differently: Take 75 mg by mouth once a day with breakfast)  . diphenhydrAMINE (BENADRYL) 25 MG tablet Take 25 mg by mouth every 6 (six) hours as needed for allergies or sleep.   . diphenhydramine-acetaminophen (TYLENOL PM) 25-500 MG TABS tablet Take 2 tablets by mouth at bedtime.  . gabapentin (NEURONTIN) 300 MG capsule Take 1 capsule (300 mg total) by mouth 2 (two) times daily. (Patient taking differently: Take 300 mg by mouth 3 (three) times daily. )  . hydrALAZINE (APRESOLINE) 50 MG tablet Take 1 tablet (50 mg total) by mouth 3 (three) times daily.  Marland Kitchen HYDROcodone-acetaminophen (NORCO/VICODIN) 5-325 MG tablet Take 1 tablet by mouth every 6 (six) hours as needed.  . isosorbide mononitrate (IMDUR) 120 MG 24 hr tablet Take 1 tablet (120 mg total) by mouth 2 (two) times daily.  Marland Kitchen linagliptin (TRADJENTA) 5 MG TABS tablet Take 1 tablet (  5 mg total) by mouth daily.  . Multiple Vitamin (MULTIVITAMIN WITH MINERALS) TABS tablet Take 1 tablet by mouth daily.  . nateglinide (STARLIX) 120 MG tablet Take 120 mg 3 (three) times daily with meals by mouth.  . nitroGLYCERIN (NITROLINGUAL) 0.4 MG/SPRAY spray PLACE ONE SPRAY UNDER THE TONGUE EVERY 5 MINUTES FOR 3 DOSES AS NEEDED FOR CHEST PAIN  . Omega-3 Fatty Acids (FISH OIL) 1000 MG CAPS Take 2,000 mg by mouth 2 (two) times daily.  . pantoprazole (PROTONIX) 40 MG tablet Take 40 mg by mouth daily.   . ranolazine (RANEXA) 500 MG 12 hr tablet Take 1 tablet (500 mg total) by mouth 2 (two) times daily.  . traMADol (ULTRAM) 50 MG tablet Take 50 mg by mouth every 8 (eight) hours as needed for moderate pain.   . traMADol (ULTRAM) 50 MG tablet Take 1 tablet (50 mg total) by mouth every 6 (six) hours as needed.  . vitamin E 400 UNIT capsule Take 400 Units by mouth every evening.   . [DISCONTINUED] atorvastatin (LIPITOR) 80 MG tablet Take 1 tablet (80 mg total) by mouth daily.  . [DISCONTINUED] carvedilol (COREG) 25 MG tablet Take 2 tablets (50 mg total) by mouth 2 (two)  times daily.  . [DISCONTINUED] furosemide (LASIX) 40 MG tablet TAKE 1 TABLET BY MOUTH ONCE DAILY (Patient taking differently: TAKE 40mg  BY MOUTH once daily in the morning)  . [DISCONTINUED] isosorbide mononitrate (IMDUR) 120 MG 24 hr tablet Take 1 tablet (120 mg total) by mouth 2 (two) times daily.     Allergies:   Ciprocin-fluocin-procin [fluocinolone acetonide]; Clarithromycin; Cleocin [clindamycin hcl]; Glimepiride [amaryl]; and Colchicine   Social History   Socioeconomic History  . Marital status: Married    Spouse name: [name]  . Number of children: 5  . Years of education: None  . Highest education level: None  Social Needs  . Financial resource strain: None  . Food insecurity - worry: None  . Food insecurity - inability: None  . Transportation needs - medical: None  . Transportation needs - non-medical: None  Occupational History  . Occupation: retired    Comment: Chartered loss adjuster  Tobacco Use  . Smoking status: Former Smoker    Types: Cigarettes  . Smokeless tobacco: Never Used  . Tobacco comment: smoked some as a teenager (high school) not even a pack a week  Substance and Sexual Activity  . Alcohol use: No    Alcohol/week: 0.0 oz    Comment: 09/15/2012 "drank a little when I was young"  . Drug use: No  . Sexual activity: Yes  Other Topics Concern  . None  Social History Narrative   Married and lives with wife in [place]. On disability.    Consumes about 2 Coke Zeros a day      Family History:  Family History  Problem Relation Age of Onset  . Lung cancer Mother   . Anemia Mother   . Polycystic kidney disease Mother   . Diabetes Father   . Heart attack Father 56       Died suddenly  . Heart failure Father   . Hyperlipidemia Father   . Hypertension Father   . Kidney failure Sister 5       Died  . Heart attack Sister   . Hypertension Sister   . Diabetes Sister   . Diabetes Brother   . Polycystic kidney disease Brother   . Diabetes Sister       ROS:  Please see the history of present illness.  All other systems are reviewed and otherwise negative.    PHYSICAL EXAM:   VS:  BP 130/62   Pulse 83   Ht 5' 5.5" (1.664 m)   Wt 234 lb 1.9 oz (106.2 kg)   SpO2 99%   BMI 38.37 kg/m   BMI: Body mass index is 38.37 kg/m. GEN: Well nourished, well developed obese AAM, in no acute distress  HEENT: normocephalic, atraumatic Neck: no JVD, carotid bruits, or masses Cardiac: RRR; no murmurs, rubs, or gallops, no significant edema  Respiratory:  Diminished BS throughout, no wheezes, rales or rhonchi, normal work of breathing GI: soft, nontender, nondistended, + BS MS: no deformity or atrophy  Skin: warm and dry, no rash Neuro:  Alert and Oriented x 3, Strength and sensation are intact, follows commands Psych: euthymic mood, full affect  Wt Readings from Last 3 Encounters:  08/26/17 231 lb 6.4 oz (105 kg)  08/11/17 235 lb (106.6 kg)  08/09/17 231 lb (104.8 kg)      Studies/Labs Reviewed:   EKG:  EKG was ordered today and personally reviewed by me and demonstrates NSR 83bpm, possible LAE, prior anterolateral infarct, no acute changes.  Recent Labs: 02/08/2017: B Natriuretic Peptide 962.0 03/31/2017: ALT 19 08/09/2017: BUN 44; Creatinine, Ser 3.59; Hemoglobin 10.9; Platelets 186; Potassium 4.3; Sodium 138   Lipid Panel    Component Value Date/Time   CHOL 146 04/01/2017 0356   CHOL 161 11/12/2016 1341   TRIG 116 04/01/2017 0356   HDL 29 (L) 04/01/2017 0356   HDL 31 (L) 11/12/2016 1341   CHOLHDL 5.0 04/01/2017 0356   VLDL 23 04/01/2017 0356   LDLCALC 94 04/01/2017 0356   LDLCALC 86 11/12/2016 1341   LDLDIRECT 72.0 11/03/2014 0923    Additional studies/ records that were reviewed today include: Summarized above.    ASSESSMENT & PLAN:   1. CAD with chronic angina - he feels he is back to baseline today. Will continue current regimen to include beta blocker (he is maintained on higher dose per primary cardiologist), amlodipine,  Ranexa. His chart populated the aspirin BPA. I will reach out to Dr. Saunders Revel to clarify if he should remain on full dose aspirin with Plavix. I reviewed prior notes and this has not changed recently. Warning sx reviewed. Addendum: per d/w Dr. Saunders Revel, he believes he and the patient previously discussed cutting down aspirin but this was deferred and continued at current dose. He will readdress in f/u. 2. Chronic combined CHF - he appears euvolemic. His diuretics have more recently been managed by the [place], and through nephrology. He will reach out to the [place] for clarification on when they wanted him to go back to Lasix 40mg  daily. We also extensively reviewed 2g sodium diet and 2L fluid restriction. It does not sound like he is watching sodium at all. He asked good questions and verbalized understanding. Also reviewed daily weights and importance of overall weight maintenance as this is likely contributing to his gout as well. 3. CKD stage IV - f/u 09/06/17 with Dr. Justin Mend as planned. 4. HTN - initial BP 140s but subsequent recheck 130/68. Will continue current regimen.   Disposition: F/u with Dr. Saunders Revel as scheduled 10/2017.   Medication Adjustments/Labs and Tests Ordered: Current medicines are reviewed at length with the patient today.  Concerns regarding medicines are outlined above. Medication changes, Labs and Tests ordered today are summarized above and listed in the Patient Instructions accessible in Encounters.  Signed, Charlie Pitter, PA-C  08/26/2017 11:41 AM    [facility] [place], [place], [place]  [address] Phone: [phone]; Fax: [phone]

## 2017-08-28 ENCOUNTER — Other Ambulatory Visit: Payer: Self-pay | Admitting: Cardiology

## 2017-08-31 NOTE — Telephone Encounter (Signed)
I am covering Dayna's box. She saw this patient recently but I dont see mention of him being on imdur. Its also a large dose. I am going to wait for her to return to approve or deny this.

## 2017-09-03 NOTE — Telephone Encounter (Signed)
Matthew Rowe, please clarify RX with patient. Our records have this listed as 1 tablet twice a day at last office visit but this prescription says 2 tablets once daily. OK to refill once we clarify how he is taking this (these sigs mean different things) Melina Copa PA-C

## 2017-09-05 NOTE — Telephone Encounter (Signed)
Called pt to verify how he is taking his Isosorbide 120 mg and per pt he is taking 1 tablet twice a day. Advised pt that the refill has been sent in to [facility].  Pt thanked me for the call.

## 2017-09-23 ENCOUNTER — Telehealth: Payer: Self-pay | Admitting: Internal Medicine

## 2017-09-23 MED ORDER — FUROSEMIDE 40 MG PO TABS: 40.0000 mg | ORAL_TABLET | Freq: Two times a day (BID) | ORAL | 3 refills | Status: DC

## 2017-09-23 MED ORDER — NITROGLYCERIN 0.4 MG/SPRAY TL SOLN: 2 refills | Status: DC

## 2017-09-23 MED ORDER — CARVEDILOL 25 MG PO TABS: 50.0000 mg | ORAL_TABLET | Freq: Two times a day (BID) | ORAL | 3 refills | Status: DC

## 2017-09-23 NOTE — Telephone Encounter (Signed)
New message      *STAT* If patient is at the pharmacy, call can be transferred to refill team.   1. Which medications need to be refilled? (please list name of each medication and dose if known) ambien 10 mg   furosemide (LASIX) 40 MG tablet Take 1 tablet (40 mg total) by mouth 2 (two) times daily.   carvedilol (COREG) 25 MG tablet Take 2 tablets (50 mg total) by mouth 2 (two) times daily.   nitroGLYCERIN (NITROLINGUAL) 0.4 MG/SPRAY spray PLACE ONE SPRAY UNDER THE TONGUE EVERY 5 MINUTES FOR 3 DOSES AS NEEDED FOR CHEST PAIN    2. Which pharmacy/location (including street and city if local pharmacy) is medication to be sent to? [facility] [phone]  3. Do they need a 30 day or 90 day supply?  [place]

## 2017-09-23 NOTE — Telephone Encounter (Signed)
Pt's medication was sent to pt's pharmacy as requested. Confirmation received.  °

## 2017-10-01 ENCOUNTER — Other Ambulatory Visit: Payer: Self-pay

## 2017-10-01 ENCOUNTER — Telehealth: Payer: Self-pay

## 2017-10-01 MED ORDER — NITROGLYCERIN 0.4 MG SL SUBL: 0.4000 mg | SUBLINGUAL_TABLET | SUBLINGUAL | 1 refills | Status: DC | PRN

## 2017-10-01 MED ORDER — NITROGLYCERIN 0.4 MG SL SUBL: 0.4000 mg | SUBLINGUAL_TABLET | SUBLINGUAL | 3 refills | Status: DC | PRN

## 2017-10-01 NOTE — Telephone Encounter (Signed)
**Note De-Identified Matthew Rowe Obfuscation** I attempted to do a PA on the pts NTG Spray but received a message through covermymeds stating that [facility] follows Medicare's rules and that the pt must try and fail NTG pill form.  I have sent in a new RX for NTG 0.4 mg tablet as requested by the pt once I explained to him that his NTG Spray was denied.

## 2017-10-02 DIAGNOSIS — I2510 Atherosclerotic heart disease of native coronary artery without angina pectoris: Secondary | ICD-10-CM | POA: Diagnosis not present

## 2017-10-02 DIAGNOSIS — D631 Anemia in chronic kidney disease: Secondary | ICD-10-CM | POA: Diagnosis not present

## 2017-10-02 DIAGNOSIS — I129 Hypertensive chronic kidney disease with stage 1 through stage 4 chronic kidney disease, or unspecified chronic kidney disease: Secondary | ICD-10-CM | POA: Diagnosis not present

## 2017-10-02 DIAGNOSIS — I219 Acute myocardial infarction, unspecified: Secondary | ICD-10-CM | POA: Diagnosis not present

## 2017-10-02 DIAGNOSIS — N183 Chronic kidney disease, stage 3 (moderate): Secondary | ICD-10-CM | POA: Diagnosis not present

## 2017-10-02 DIAGNOSIS — Q613 Polycystic kidney, unspecified: Secondary | ICD-10-CM | POA: Diagnosis not present

## 2017-10-02 DIAGNOSIS — N2581 Secondary hyperparathyroidism of renal origin: Secondary | ICD-10-CM | POA: Diagnosis not present

## 2017-10-14 DIAGNOSIS — E119 Type 2 diabetes mellitus without complications: Secondary | ICD-10-CM | POA: Diagnosis not present

## 2017-10-14 DIAGNOSIS — R972 Elevated prostate specific antigen [PSA]: Secondary | ICD-10-CM | POA: Diagnosis not present

## 2017-10-15 ENCOUNTER — Emergency Department (HOSPITAL_COMMUNITY): Payer: Medicare HMO

## 2017-10-15 ENCOUNTER — Other Ambulatory Visit: Payer: Self-pay

## 2017-10-15 ENCOUNTER — Encounter (HOSPITAL_COMMUNITY): Payer: Self-pay

## 2017-10-15 ENCOUNTER — Inpatient Hospital Stay (HOSPITAL_COMMUNITY)
Admission: EM | Admit: 2017-10-15 | Discharge: 2017-10-18 | DRG: 291 | Disposition: A | Discharge status: Home / Self Care | Payer: Medicare HMO | Attending: Interventional Cardiology | Admitting: Interventional Cardiology

## 2017-10-15 DIAGNOSIS — Z9861 Coronary angioplasty status: Secondary | ICD-10-CM

## 2017-10-15 DIAGNOSIS — I130 Hypertensive heart and chronic kidney disease with heart failure and stage 1 through stage 4 chronic kidney disease, or unspecified chronic kidney disease: Principal | ICD-10-CM | POA: Diagnosis present

## 2017-10-15 DIAGNOSIS — Z951 Presence of aortocoronary bypass graft: Secondary | ICD-10-CM

## 2017-10-15 DIAGNOSIS — Z6838 Body mass index (BMI) 38.0-38.9, adult: Secondary | ICD-10-CM | POA: Diagnosis not present

## 2017-10-15 DIAGNOSIS — I350 Nonrheumatic aortic (valve) stenosis: Secondary | ICD-10-CM | POA: Diagnosis present

## 2017-10-15 DIAGNOSIS — Z9989 Dependence on other enabling machines and devices: Secondary | ICD-10-CM | POA: Diagnosis not present

## 2017-10-15 DIAGNOSIS — E872 Acidosis: Secondary | ICD-10-CM | POA: Diagnosis present

## 2017-10-15 DIAGNOSIS — R072 Precordial pain: Secondary | ICD-10-CM

## 2017-10-15 DIAGNOSIS — Z794 Long term (current) use of insulin: Secondary | ICD-10-CM

## 2017-10-15 DIAGNOSIS — R0602 Shortness of breath: Secondary | ICD-10-CM

## 2017-10-15 DIAGNOSIS — Z87891 Personal history of nicotine dependence: Secondary | ICD-10-CM

## 2017-10-15 DIAGNOSIS — I5043 Acute on chronic combined systolic (congestive) and diastolic (congestive) heart failure: Secondary | ICD-10-CM | POA: Diagnosis present

## 2017-10-15 DIAGNOSIS — Z7902 Long term (current) use of antithrombotics/antiplatelets: Secondary | ICD-10-CM | POA: Diagnosis not present

## 2017-10-15 DIAGNOSIS — I5042 Chronic combined systolic (congestive) and diastolic (congestive) heart failure: Secondary | ICD-10-CM | POA: Diagnosis not present

## 2017-10-15 DIAGNOSIS — Q613 Polycystic kidney, unspecified: Secondary | ICD-10-CM

## 2017-10-15 DIAGNOSIS — Z79899 Other long term (current) drug therapy: Secondary | ICD-10-CM | POA: Diagnosis not present

## 2017-10-15 DIAGNOSIS — Z9981 Dependence on supplemental oxygen: Secondary | ICD-10-CM | POA: Diagnosis not present

## 2017-10-15 DIAGNOSIS — R079 Chest pain, unspecified: Secondary | ICD-10-CM | POA: Diagnosis present

## 2017-10-15 DIAGNOSIS — E1169 Type 2 diabetes mellitus with other specified complication: Secondary | ICD-10-CM | POA: Diagnosis not present

## 2017-10-15 DIAGNOSIS — G4733 Obstructive sleep apnea (adult) (pediatric): Secondary | ICD-10-CM | POA: Diagnosis present

## 2017-10-15 DIAGNOSIS — E1121 Type 2 diabetes mellitus with diabetic nephropathy: Secondary | ICD-10-CM | POA: Diagnosis present

## 2017-10-15 DIAGNOSIS — E1122 Type 2 diabetes mellitus with diabetic chronic kidney disease: Secondary | ICD-10-CM | POA: Diagnosis present

## 2017-10-15 DIAGNOSIS — I252 Old myocardial infarction: Secondary | ICD-10-CM | POA: Diagnosis not present

## 2017-10-15 DIAGNOSIS — I2510 Atherosclerotic heart disease of native coronary artery without angina pectoris: Secondary | ICD-10-CM

## 2017-10-15 DIAGNOSIS — I25110 Atherosclerotic heart disease of native coronary artery with unstable angina pectoris: Secondary | ICD-10-CM | POA: Diagnosis present

## 2017-10-15 DIAGNOSIS — I10 Essential (primary) hypertension: Secondary | ICD-10-CM | POA: Diagnosis present

## 2017-10-15 DIAGNOSIS — I340 Nonrheumatic mitral (valve) insufficiency: Secondary | ICD-10-CM | POA: Diagnosis not present

## 2017-10-15 DIAGNOSIS — N184 Chronic kidney disease, stage 4 (severe): Secondary | ICD-10-CM | POA: Diagnosis present

## 2017-10-15 DIAGNOSIS — I361 Nonrheumatic tricuspid (valve) insufficiency: Secondary | ICD-10-CM | POA: Diagnosis not present

## 2017-10-15 DIAGNOSIS — I119 Hypertensive heart disease without heart failure: Secondary | ICD-10-CM | POA: Diagnosis present

## 2017-10-15 DIAGNOSIS — Z7982 Long term (current) use of aspirin: Secondary | ICD-10-CM

## 2017-10-15 DIAGNOSIS — I255 Ischemic cardiomyopathy: Secondary | ICD-10-CM | POA: Diagnosis present

## 2017-10-15 DIAGNOSIS — R778 Other specified abnormalities of plasma proteins: Secondary | ICD-10-CM

## 2017-10-15 DIAGNOSIS — Z955 Presence of coronary angioplasty implant and graft: Secondary | ICD-10-CM | POA: Diagnosis not present

## 2017-10-15 DIAGNOSIS — K219 Gastro-esophageal reflux disease without esophagitis: Secondary | ICD-10-CM | POA: Diagnosis present

## 2017-10-15 DIAGNOSIS — D649 Anemia, unspecified: Secondary | ICD-10-CM | POA: Diagnosis present

## 2017-10-15 DIAGNOSIS — E785 Hyperlipidemia, unspecified: Secondary | ICD-10-CM | POA: Diagnosis present

## 2017-10-15 DIAGNOSIS — R748 Abnormal levels of other serum enzymes: Secondary | ICD-10-CM | POA: Diagnosis not present

## 2017-10-15 DIAGNOSIS — M109 Gout, unspecified: Secondary | ICD-10-CM | POA: Diagnosis present

## 2017-10-15 DIAGNOSIS — R0902 Hypoxemia: Secondary | ICD-10-CM | POA: Diagnosis not present

## 2017-10-15 DIAGNOSIS — I5033 Acute on chronic diastolic (congestive) heart failure: Secondary | ICD-10-CM | POA: Diagnosis not present

## 2017-10-15 DIAGNOSIS — R7989 Other specified abnormal findings of blood chemistry: Secondary | ICD-10-CM | POA: Diagnosis not present

## 2017-10-15 DIAGNOSIS — D631 Anemia in chronic kidney disease: Secondary | ICD-10-CM | POA: Diagnosis present

## 2017-10-15 DIAGNOSIS — E1159 Type 2 diabetes mellitus with other circulatory complications: Secondary | ICD-10-CM | POA: Diagnosis present

## 2017-10-15 HISTORY — DX: Acute on chronic combined systolic (congestive) and diastolic (congestive) heart failure: I50.43

## 2017-10-15 LAB — CBG MONITORING, ED: GLUCOSE-CAPILLARY: 240 mg/dL — AB (ref 65–99)

## 2017-10-15 LAB — HEPATIC FUNCTION PANEL
ALT: 17 U/L (ref 17–63)
AST: 40 U/L (ref 15–41)
Albumin: 3.5 g/dL (ref 3.5–5.0)
Alkaline Phosphatase: 82 U/L (ref 38–126)
BILIRUBIN DIRECT: 0.4 mg/dL (ref 0.1–0.5)
BILIRUBIN INDIRECT: 0.8 mg/dL (ref 0.3–0.9)
BILIRUBIN TOTAL: 1.2 mg/dL (ref 0.3–1.2)
Total Protein: 6.3 g/dL — ABNORMAL LOW (ref 6.5–8.1)

## 2017-10-15 LAB — TROPONIN I
TROPONIN I: 0.12 ng/mL — AB (ref <0.03)
TROPONIN I: 0.11 ng/mL — AB (ref <0.03)

## 2017-10-15 LAB — CBC
HCT: 28.9 % — ABNORMAL LOW (ref 39.0–52.0)
HEMOGLOBIN: 9.4 g/dL — AB (ref 13.0–17.0)
MCH: 28.1 pg (ref 26.0–34.0)
MCHC: 32.5 g/dL (ref 30.0–36.0)
MCV: 86.5 fL (ref 78.0–100.0)
Platelets: 211 10*3/uL (ref 150–400)
RBC: 3.34 MIL/uL — ABNORMAL LOW (ref 4.22–5.81)
RDW: 16.3 % — ABNORMAL HIGH (ref 11.5–15.5)
WBC: 10 10*3/uL (ref 4.0–10.5)

## 2017-10-15 LAB — I-STAT TROPONIN, ED: Troponin i, poc: 0.13 ng/mL (ref 0.00–0.08)

## 2017-10-15 LAB — BASIC METABOLIC PANEL
ANION GAP: 14 (ref 5–15)
BUN: 41 mg/dL — ABNORMAL HIGH (ref 6–20)
CHLORIDE: 106 mmol/L (ref 101–111)
CO2: 18 mmol/L — AB (ref 22–32)
Calcium: 9.2 mg/dL (ref 8.9–10.3)
Creatinine, Ser: 3.61 mg/dL — ABNORMAL HIGH (ref 0.61–1.24)
GFR calc non Af Amer: 17 mL/min — ABNORMAL LOW (ref >60)
GFR, EST AFRICAN AMERICAN: 20 mL/min — AB (ref >60)
Glucose, Bld: 162 mg/dL — ABNORMAL HIGH (ref 65–99)
Potassium: 4.6 mmol/L (ref 3.5–5.1)
Sodium: 138 mmol/L (ref 135–145)

## 2017-10-15 LAB — PROTIME-INR
INR: 1
PROTHROMBIN TIME: 13.1 s (ref 11.4–15.2)

## 2017-10-15 LAB — TSH: TSH: 2.536 u[IU]/mL (ref 0.350–4.500)

## 2017-10-15 MED ORDER — HEPARIN (PORCINE) IN NACL 100-0.45 UNIT/ML-% IJ SOLN
1400.0000 [IU]/h | INTRAMUSCULAR | Status: DC
  Administered 2017-10-15: 1100 [IU]/h via INTRAVENOUS
  Filled 2017-10-15 (×4): qty 250

## 2017-10-15 MED ORDER — VITAMIN D 1000 UNITS PO TABS
2000.0000 [IU] | ORAL_TABLET | ORAL | Status: DC
  Administered 2017-10-16 – 2017-10-18 (×3): 2000 [IU] via ORAL
  Filled 2017-10-15 (×4): qty 2

## 2017-10-15 MED ORDER — RANOLAZINE ER 500 MG PO TB12
500.0000 mg | ORAL_TABLET | Freq: Two times a day (BID) | ORAL | Status: DC
  Administered 2017-10-15 – 2017-10-18 (×6): 500 mg via ORAL
  Filled 2017-10-15 (×6): qty 1

## 2017-10-15 MED ORDER — INSULIN ASPART PROT & ASPART (70-30 MIX) 100 UNIT/ML ~~LOC~~ SUSP
20.0000 [IU] | Freq: Two times a day (BID) | SUBCUTANEOUS | Status: DC
  Administered 2017-10-16 – 2017-10-18 (×4): 20 [IU] via SUBCUTANEOUS
  Filled 2017-10-15: qty 10

## 2017-10-15 MED ORDER — MORPHINE SULFATE (PF) 4 MG/ML IV SOLN
2.0000 mg | Freq: Once | INTRAVENOUS | Status: AC
  Administered 2017-10-15: 2 mg via INTRAVENOUS
  Filled 2017-10-15: qty 1

## 2017-10-15 MED ORDER — NITROGLYCERIN 0.4 MG SL SUBL: 0.4000 mg | SUBLINGUAL_TABLET | SUBLINGUAL | Status: DC | PRN

## 2017-10-15 MED ORDER — OMEGA-3-ACID ETHYL ESTERS 1 G PO CAPS
2.0000 g | ORAL_CAPSULE | Freq: Two times a day (BID) | ORAL | Status: DC
  Administered 2017-10-15 – 2017-10-18 (×6): 2 g via ORAL
  Filled 2017-10-15 (×6): qty 2

## 2017-10-15 MED ORDER — AMLODIPINE BESYLATE 10 MG PO TABS
10.0000 mg | ORAL_TABLET | Freq: Every day | ORAL | Status: DC
  Administered 2017-10-16 – 2017-10-18 (×3): 10 mg via ORAL
  Filled 2017-10-15 (×3): qty 1

## 2017-10-15 MED ORDER — RENA-VITE PO TABS
1.0000 | ORAL_TABLET | Freq: Every day | ORAL | Status: DC
  Administered 2017-10-15 – 2017-10-17 (×3): 1 via ORAL
  Filled 2017-10-15 (×3): qty 1

## 2017-10-15 MED ORDER — PANTOPRAZOLE SODIUM 40 MG PO TBEC
40.0000 mg | DELAYED_RELEASE_TABLET | Freq: Every day | ORAL | Status: DC
  Administered 2017-10-16 – 2017-10-18 (×3): 40 mg via ORAL
  Filled 2017-10-15 (×3): qty 1

## 2017-10-15 MED ORDER — CARVEDILOL 25 MG PO TABS
50.0000 mg | ORAL_TABLET | Freq: Two times a day (BID) | ORAL | Status: DC
  Administered 2017-10-15 – 2017-10-18 (×6): 50 mg via ORAL
  Filled 2017-10-15 (×2): qty 2
  Filled 2017-10-15: qty 4
  Filled 2017-10-15 (×3): qty 2

## 2017-10-15 MED ORDER — TRAMADOL HCL 50 MG PO TABS
50.0000 mg | ORAL_TABLET | Freq: Four times a day (QID) | ORAL | Status: DC | PRN
  Administered 2017-10-16 – 2017-10-18 (×5): 50 mg via ORAL
  Filled 2017-10-15 (×5): qty 1

## 2017-10-15 MED ORDER — GABAPENTIN 300 MG PO CAPS
300.0000 mg | ORAL_CAPSULE | Freq: Three times a day (TID) | ORAL | Status: DC
  Administered 2017-10-15 – 2017-10-18 (×8): 300 mg via ORAL
  Filled 2017-10-15 (×8): qty 1

## 2017-10-15 MED ORDER — FAMOTIDINE 20 MG PO TABS
40.0000 mg | ORAL_TABLET | Freq: Every day | ORAL | Status: DC
  Administered 2017-10-15 – 2017-10-16 (×2): 40 mg via ORAL
  Filled 2017-10-15 (×2): qty 2

## 2017-10-15 MED ORDER — FUROSEMIDE 40 MG PO TABS
40.0000 mg | ORAL_TABLET | Freq: Every day | ORAL | Status: DC
  Administered 2017-10-15: 40 mg via ORAL
  Filled 2017-10-15: qty 2

## 2017-10-15 MED ORDER — ISOSORBIDE MONONITRATE ER 60 MG PO TB24
120.0000 mg | ORAL_TABLET | Freq: Two times a day (BID) | ORAL | Status: DC
  Administered 2017-10-15 – 2017-10-18 (×5): 120 mg via ORAL
  Filled 2017-10-15 (×4): qty 2
  Filled 2017-10-15: qty 4

## 2017-10-15 MED ORDER — ZOLPIDEM TARTRATE 5 MG PO TABS
10.0000 mg | ORAL_TABLET | Freq: Every evening | ORAL | Status: DC | PRN
  Administered 2017-10-16: 10 mg via ORAL
  Filled 2017-10-15: qty 2

## 2017-10-15 MED ORDER — HYDRALAZINE HCL 50 MG PO TABS
50.0000 mg | ORAL_TABLET | Freq: Three times a day (TID) | ORAL | Status: DC
  Administered 2017-10-15 – 2017-10-18 (×7): 50 mg via ORAL
  Filled 2017-10-15 (×6): qty 1
  Filled 2017-10-15: qty 2

## 2017-10-15 MED ORDER — HEPARIN BOLUS VIA INFUSION
4000.0000 [IU] | Freq: Once | INTRAVENOUS | Status: AC
  Administered 2017-10-15: 4000 [IU] via INTRAVENOUS
  Filled 2017-10-15: qty 4000

## 2017-10-15 MED ORDER — INSULIN ASPART 100 UNIT/ML ~~LOC~~ SOLN
0.0000 [IU] | Freq: Three times a day (TID) | SUBCUTANEOUS | Status: DC
  Administered 2017-10-15: 3 [IU] via SUBCUTANEOUS
  Administered 2017-10-16: 1 [IU] via SUBCUTANEOUS
  Administered 2017-10-16 (×2): 2 [IU] via SUBCUTANEOUS
  Administered 2017-10-17: 5 [IU] via SUBCUTANEOUS
  Administered 2017-10-17 – 2017-10-18 (×3): 2 [IU] via SUBCUTANEOUS
  Filled 2017-10-15 (×2): qty 1

## 2017-10-15 MED ORDER — ASPIRIN EC 325 MG PO TBEC
325.0000 mg | DELAYED_RELEASE_TABLET | Freq: Every day | ORAL | Status: DC
  Administered 2017-10-16 – 2017-10-18 (×3): 325 mg via ORAL
  Filled 2017-10-15 (×3): qty 1

## 2017-10-15 MED ORDER — ONDANSETRON HCL 4 MG/2ML IJ SOLN: 4.0000 mg | Freq: Four times a day (QID) | INTRAMUSCULAR | Status: DC | PRN

## 2017-10-15 MED ORDER — CLOPIDOGREL BISULFATE 75 MG PO TABS
75.0000 mg | ORAL_TABLET | Freq: Once | ORAL | Status: DC
  Filled 2017-10-15: qty 1

## 2017-10-15 MED ORDER — ACETAMINOPHEN 500 MG PO TABS: 500.0000 mg | ORAL_TABLET | Freq: Three times a day (TID) | ORAL | Status: DC | PRN

## 2017-10-15 MED ORDER — ATORVASTATIN CALCIUM 80 MG PO TABS
80.0000 mg | ORAL_TABLET | Freq: Every day | ORAL | Status: DC
  Administered 2017-10-15 – 2017-10-18 (×4): 80 mg via ORAL
  Filled 2017-10-15 (×5): qty 1

## 2017-10-15 MED ORDER — ALLOPURINOL 100 MG PO TABS
100.0000 mg | ORAL_TABLET | Freq: Every day | ORAL | Status: DC
  Administered 2017-10-15 – 2017-10-17 (×3): 100 mg via ORAL
  Filled 2017-10-15 (×3): qty 1

## 2017-10-15 NOTE — Consult Note (Signed)
[facility] Consult Note     Date: 10/15/2017                  Patient Name:  Matthew Rowe.  MRN: [id number]  DOB: 08-26-1961  Age / Sex: 57 y.o., male         PCP: Patient, No Pcp Per                 Service Requesting Consult: Cardiology- Dr. Irish Lack                 Reason for Consult: Management of CKD IV with possible need for cardiac cath            Chief Complaint: chest pain  HPI: Pt is a 43M with a PMH significant for CAD, CKD IV 2/2 PKD followed by Dr. Justin Mend, HTN, DM IIHLD, and combined systolic and diastolic CHF who is now seen in consultation at the request of Dr. Irish Lack for eval and management of CKD in the setting of possible cardiac cath.  Pt has a complicated cardiac history.  He had an inferior MI in 1999 with multiple stents placed followed by CABG in 2006.  Unfortunately he had an abnormal nuclear study ni 2015 leading to another PCI in 2015.  He had another abnormal nuc 11/2016 which showed an EF of 35% with a large fixed defect in the basilar, mid and apical inferior wall with extension into the cardiac apex.    At that time, pt declined cath due to concerns over his CKD.    Unfortunately, he has had 2 admissions in 2018 and this one here for NSTEMIs that were medically managed.    It appears his creatinine has ranged recently from 3.6 to > 4.0.  Pt last saw Dr. Justin Mend on 10/02/2017 where plans were made to attend TOPPS class and consider PD.    Pt reports he's chest pain free now.  Initial trop was 0.12.  He is on a heparin gtt.  Cr is 3.6.  He and his wife had some time to talk- they are electing for medical management of NSTEMI unless trops sig rise or pt experiences intractable CP.     Past Medical History:  Diagnosis Date  . Chronic anemia   . Chronic combined systolic and diastolic CHF (congestive heart failure) ([place])   . Chronic stable angina ([place])   . CKD (chronic kidney disease), stage IV ([place])   . Coronary artery disease    a.  INF MI 1999: PCI of RCA;  b.  extensive stenting of LAD;  c. s/p CABG with RIMA->RCA in 2006;  d. Stable, Low risk MV 05/2012; e. 09/2012 Cath: stable anatomy->med Rx. f. Abnl nuc 11/2013 -> med rx; g. 11/2013 Cath/PCI: s/p DES to RCA. h. Abnormal nuc 11/2016, pt declined cath.   . DM2 (diabetes mellitus, type 2) ([place])   . Dyslipidemia   . GERD (gastroesophageal reflux disease)   . History of gout   . Hypertensive heart disease   . Ischemic cardiomyopathy   . Mild aortic stenosis   . Myocardial infarction [facility]) 1999; 2002; 2003; 2006; ~ 2008  . OSA on CPAP   . Polycystic kidney disease     Past Surgical History:  Procedure Laterality Date  . CARDIAC CATHETERIZATION  2012  . CARDIAC CATHETERIZATION  2013  . CARDIAC CATHETERIZATION  2014   LHC (1/14): total occlusion of distal LAD, diffuse disease in ramus, 70% proximal LCx, 50% proximal and  mid RCA, 50% distal RCA, patent RIMA-RCA.  Marland Kitchen CORONARY ANGIOPLASTY WITH STENT PLACEMENT  1999 / 2002 / 2004 / 2006?  . CORONARY ARTERY BYPASS GRAFT  2006   CABG X?; in [place]  . CYSTECTOMY  1990's   off face  . LEFT HEART CATHETERIZATION WITH CORONARY/GRAFT ANGIOGRAM N/A 09/16/2012   Procedure: LEFT HEART CATHETERIZATION WITH [name];  Surgeon: [name], MD;  [facility] LAB;  Service: Cardiovascular;  Laterality: N/A;  . LEFT HEART CATHETERIZATION WITH CORONARY/GRAFT ANGIOGRAM N/A 12/02/2013   Procedure: LEFT HEART CATHETERIZATION WITH [name];  Surgeon: [name], MD;  Location: [facility] CATH LAB;  Service: Cardiovascular;  Laterality: N/A;  . PERCUTANEOUS CORONARY STENT INTERVENTION (PCI-S) N/A 12/03/2013   Procedure: PERCUTANEOUS CORONARY STENT INTERVENTION (PCI-S);  Surgeon: [name], MD;  Location: [facility] CATH LAB;  Service: Cardiovascular;  Laterality: N/A;    Family History  Problem Relation Age of Onset  . Lung cancer Mother   . Anemia Mother   . Polycystic kidney disease Mother   .  Diabetes Father   . Heart attack Father 64       Died suddenly  . Heart failure Father   . Hyperlipidemia Father   . Hypertension Father   . Kidney failure Sister 5       Died  . Heart attack Sister   . Hypertension Sister   . Diabetes Sister   . Diabetes Brother   . Polycystic kidney disease Brother   . Diabetes Sister    Social History:  reports that he has quit smoking. His smoking use included cigarettes. he has never used smokeless tobacco. He reports that he does not drink alcohol or use drugs.  Allergies:  Allergies  Allergen Reactions  . Ciprocin-Fluocin-Procin [Fluocinolone Acetonide] Rash  . Clarithromycin Itching and Other (See Comments)    "Biaxin" Eyes itch and burn  . Cleocin [Clindamycin Hcl] Anaphylaxis and Swelling  . Glimepiride [Amaryl] Other (See Comments)    Elevates liver function   . Colchicine Other (See Comments)    Affected kidneys Affected kidneys Affected kidneys     (Not in a hospital admission)  Results for orders placed or performed during the hospital encounter of 10/15/17 (from the past 48 hour(s))  Basic metabolic panel     Status: Abnormal   Collection Time: 10/15/17 11:55 AM  Result Value Ref Range   Sodium 138 135 - 145 mmol/L   Potassium 4.6 3.5 - 5.1 mmol/L   Chloride 106 101 - 111 mmol/L   CO2 18 (L) 22 - 32 mmol/L   Glucose, Bld 162 (H) 65 - 99 mg/dL   BUN 41 (H) 6 - 20 mg/dL   Creatinine, Ser 3.61 (H) 0.61 - 1.24 mg/dL   Calcium 9.2 8.9 - 10.3 mg/dL   GFR calc non Af Amer 17 (L) >60 mL/min   GFR calc Af Amer 20 (L) >60 mL/min    Comment: (NOTE) The eGFR has been calculated using the CKD EPI equation. This calculation has not been validated in all clinical situations. eGFR's persistently <60 mL/min signify possible Chronic Kidney Disease.    Anion gap 14 5 - 15    Comment: Performed at [facility] [address], [place] [address]  CBC     Status: Abnormal   Collection Time: 10/15/17 11:55 AM  Result  Value Ref Range   WBC 10.0 4.0 - 10.5 K/uL   RBC 3.34 (L) 4.22 - 5.81 MIL/uL  Hemoglobin 9.4 (L) 13.0 - 17.0 g/dL   HCT 28.9 (L) 39.0 - 52.0 %   MCV 86.5 78.0 - 100.0 fL   MCH 28.1 26.0 - 34.0 pg   MCHC 32.5 30.0 - 36.0 g/dL   RDW 16.3 (H) 11.5 - 15.5 %   Platelets 211 150 - 400 K/uL    Comment: Performed at [facility] [address], [place], [place] [address]  I-stat troponin, ED     Status: Abnormal   Collection Time: 10/15/17 12:01 PM  Result Value Ref Range   Troponin i, poc 0.13 (HH) 0.00 - 0.08 ng/mL   Comment NOTIFIED PHYSICIAN    Comment 3            Comment: Due to the release kinetics of cTnI, a negative result within the first hours of the onset of symptoms does not rule out myocardial infarction with certainty. If myocardial infarction is still suspected, repeat the test at appropriate intervals.   Troponin I (q 6hr x 3)     Status: Abnormal   Collection Time: 10/15/17 12:57 PM  Result Value Ref Range   Troponin I 0.12 (HH) <0.03 ng/mL    Comment: CRITICAL RESULT CALLED TO, READ BACK BY AND VERIFIED WITH: Berlinda Last RN 1426 10/15/2017 BY A BENNETT Performed at [facility], [address], [place], [place] [address]    Dg Chest 2 View  Result Date: 10/15/2017 CLINICAL DATA:  Onset last night constant chest pain and shortness of breath EXAM: CHEST  2 VIEW COMPARISON:  08/09/2017 FINDINGS: Sternotomy wires overlie normal cardiac silhouette. Normal pulmonary vasculature. No effusion, infiltrate, or pneumothorax. No acute osseous abnormality. No acute cardiopulmonary process. IMPRESSION: No active cardiopulmonary disease. Electronically Signed   By: Suzy Bouchard M.D.   On: 10/15/2017 12:23    ROS: all other systems reviewed and are negative except as per HPI  Blood pressure (!) 145/76, pulse 91, temperature 98.4 F (36.9 C), temperature source Oral, resp. rate (!) 34, height 5' 5"  (1.651 m), weight 108.9 kg (240 lb), SpO2 94 %. Physical Exam   GEN sitting in bed, NAD HEENT EOMI, PERRL NECK thick, unable to appreciate JVD PULM wearing O2, faint bibasilar crackles bilaterally CV RRR soft systolic murmur ABD obese, nontender, nondistended EXT trace LE edema NEURO AAO x3 SKIN warm and dry no rashes  Assessment/Plan  1. NSTEMI: being medically managed for now.  I did discuss with pt and wife that there would be a sig risk of CIN/ requiring dialysis if cath needed.  They understand and they are willing to proceed if needed.  Per cardiology  2.  CKD IV: secondary to PKD.  Cr is 3.6, better than previous value gotten at [facility] of > 4.  He has no indications to start dialysis now and was hoping to do PD.    3.  Combined systolic and diastolic CHF--> has a few fine crackles at the bases but CXR from today clear.  Lasix 40 mg daily has been ordered per cardiology.  4.  DM II: per primary  5.  Mild metabolic acidosis: if persistent on labs here as inpt need to start sodium bicarb PO.   Madelon Lips MD [facility] pgr [phone] 10/15/2017, 9:05 PM

## 2017-10-15 NOTE — ED Notes (Signed)
CARB MODIFIED TRAY ORDERED

## 2017-10-15 NOTE — ED Notes (Signed)
Pt given urinal.

## 2017-10-15 NOTE — ED Triage Notes (Signed)
Onset last night constant chest pain and shortness of breath.  Talking makes better, deep breathing makes worse.  NAD at triage.

## 2017-10-15 NOTE — ED Provider Notes (Signed)
[facility] EMERGENCY DEPARTMENT Provider Note   CSN: [id number] Arrival date & time: 10/15/17  1111     History   Chief Complaint Chief Complaint  Patient presents with  . Chest Pain    HPI Matthew Rowe. is a 57 y.o. male with history of CAD (inferior MI '99 with PCI of RCA, prior extensive stenting of LAD, CABG with RIMA to RCA '06, DES to mid-distal RCA 11/2013), chronic angina, CKD stage IV due to polycystic kidney disease, mild aortic stenosis, HTN, T2DM, GERD, OSA, gout, HLD, and chronic combined CHF presenting with chief complaint of chest pain.   Patient states last night he began having left-sided pressure-like chest pain that radiated to his left arm, associated with shortness of breath, and relieved by nitro. He was sitting and watching TV when this occurred. The CP occurred again this AM and was relieved by nitro. He is currently having chest pressure and describes it as "an elephant on his chest." The patient has chronic angina, but states this feels different than his anginal like symptoms, which are usually sharp in quality. Denies associated diaphoresis or nausea/vomiting.      Past Medical History:  Diagnosis Date  . Chronic anemia   . Chronic combined systolic and diastolic CHF (congestive heart failure) ([place])   . Chronic stable angina ([place])   . CKD (chronic kidney disease), stage IV ([place])   . Coronary artery disease    a. INF MI 1999: PCI of RCA;  b.  extensive stenting of LAD;  c. s/p CABG with RIMA->RCA in 2006;  d. Stable, Low risk MV 05/2012; e. 09/2012 Cath: stable anatomy->med Rx. f. Abnl nuc 11/2013 -> med rx; g. 11/2013 Cath/PCI: s/p DES to RCA. h. Abnormal nuc 11/2016, pt declined cath.   . DM2 (diabetes mellitus, type 2) ([place])   . Dyslipidemia   . GERD (gastroesophageal reflux disease)   . History of gout   . Hypertensive heart disease   . Ischemic cardiomyopathy   . Mild aortic stenosis   . Myocardial infarction [facility]) 1999; 2002;  2003; 2006; ~ 2008  . OSA on CPAP   . Polycystic kidney disease     Patient Active Problem List   Diagnosis Date Noted  . Chronic kidney disease, stage IV (severe) ([place]) 07/22/2017  . Hyperkalemia 04/01/2017  . Non-ST elevation (NSTEMI) myocardial infarction ([place]) 04/01/2017  . Chest pain 04/01/2017  . Hx of CABG   . Ischemic cardiomyopathy 02/21/2017  . Chronic diastolic heart failure ([place]) 02/08/2017  . Acute on chronic diastolic CHF (congestive heart failure) ([place]) 02/08/2017  . Mixed hyperlipidemia   . Stage 3 chronic kidney disease ([place])   . Chronic kidney disease, stage III (moderate) ([facility])   . GERD (gastroesophageal reflux disease) 05/08/2015  . Gout 05/08/2015  . MI (myocardial infarction) ([place]) 04/19/2015  . Coronary artery disease   . Hypertensive heart disease   . Precordial chest pain 11/27/2013  . Financial difficulty 03/13/2013  . Medically noncompliant 03/13/2013  . Type 2 diabetes mellitus without complications ([place])   . Obesity-BMI 42 11/27/2012  . Polycystic kidney disease 09/15/2012  . OSA on CPAP 09/15/2012  . Essential hypertension 03/27/2011  . Hyperlipidemia associated with type 2 diabetes mellitus [facility])     Past Surgical History:  Procedure Laterality Date  . CARDIAC CATHETERIZATION  2012  . CARDIAC CATHETERIZATION  2013  . CARDIAC CATHETERIZATION  2014   LHC (1/14): total occlusion of distal LAD, diffuse disease in ramus,  70% proximal LCx, 50% proximal and mid RCA, 50% distal RCA, patent RIMA-RCA.  Marland Kitchen CORONARY ANGIOPLASTY WITH STENT PLACEMENT  1999 / 2002 / 2004 / 2006?  . CORONARY ARTERY BYPASS GRAFT  2006   CABG X?; in [place]  . CYSTECTOMY  1990's   off face  . LEFT HEART CATHETERIZATION WITH CORONARY/GRAFT ANGIOGRAM N/A 09/16/2012   Procedure: LEFT HEART CATHETERIZATION WITH [name];  Surgeon: [name], MD;  Location: [facility] CATH LAB;  Service: Cardiovascular;  Laterality: N/A;  . LEFT HEART CATHETERIZATION WITH  CORONARY/GRAFT ANGIOGRAM N/A 12/02/2013   Procedure: LEFT HEART CATHETERIZATION WITH [name];  Surgeon: [name], MD;  Location: [facility] CATH LAB;  Service: Cardiovascular;  Laterality: N/A;  . PERCUTANEOUS CORONARY STENT INTERVENTION (PCI-S) N/A 12/03/2013   Procedure: PERCUTANEOUS CORONARY STENT INTERVENTION (PCI-S);  Surgeon: [name], MD;  Location: [facility] CATH LAB;  Service: Cardiovascular;  Laterality: N/A;       Home Medications    Prior to Admission medications   Medication Sig Start Date End Date Taking? Authorizing Provider  amLODipine (NORVASC) 10 MG tablet Take 1 tablet (10 mg total) by mouth daily. 01/12/15  Yes Copland, Gay Filler, MD  isosorbide mononitrate (IMDUR) 120 MG 24 hr tablet Take 1 tablet (120 mg total) by mouth 2 (two) times daily. 08/26/17  Yes Dunn, Dayna N, PA-C  pantoprazole (PROTONIX) 40 MG tablet Take 40 mg by mouth daily.    Yes [provider]  ranolazine (RANEXA) 500 MG 12 hr tablet Take 1 tablet (500 mg total) by mouth 2 (two) times daily. 03/29/17  Yes End, Harrell Gave, MD  zolpidem (AMBIEN) 10 MG tablet Take 10 mg by mouth at bedtime as needed. 09/30/17  Yes [provider]  acetaminophen (TYLENOL) 500 MG tablet Take 500 mg by mouth 3 (three) times daily as needed (for pain or headaches).     [provider]  allopurinol (ZYLOPRIM) 100 MG tablet Take 100 mg by mouth every morning.    [provider]  aspirin EC 325 MG EC tablet Take 1 tablet (325 mg total) by mouth daily. 04/04/17   Geradine Girt, DO  atorvastatin (LIPITOR) 80 MG tablet Take 1 tablet (80 mg total) by mouth daily. 08/26/17 11/24/17  Dunn, Nedra Hai, PA-C  carvedilol (COREG) 25 MG tablet Take 2 tablets (50 mg total) by mouth 2 (two) times daily. 09/23/17   End, Harrell Gave, MD  Cholecalciferol 2000 units TABS Take 2,000 Units by mouth every morning.    [provider]  clopidogrel (PLAVIX) 75 MG tablet TAKE ONE TABLET BY MOUTH  ONCE DAILY WITH BREAKFAST Patient taking differently: Take 75 mg by mouth once a day with breakfast 12/25/16   End, Harrell Gave, MD  diphenhydrAMINE (BENADRYL) 25 MG tablet Take 25 mg by mouth every 6 (six) hours as needed for allergies or sleep.     [provider]  furosemide (LASIX) 40 MG tablet Take 1 tablet (40 mg total) by mouth 2 (two) times daily. 09/23/17   End, Harrell Gave, MD  gabapentin (NEURONTIN) 300 MG capsule Take 1 capsule (300 mg total) by mouth 2 (two) times daily. Patient taking differently: Take 300 mg by mouth 3 (three) times daily.  04/03/17   Geradine Girt, DO  hydrALAZINE (APRESOLINE) 50 MG tablet Take 1 tablet (50 mg total) by mouth 3 (three) times daily. 12/20/16   End, Harrell Gave, MD  HYDROcodone-acetaminophen (NORCO/VICODIN) 5-325 MG tablet Take 1 tablet by mouth every 6 (six) hours as needed. 08/11/17  Volanda Napoleon, PA-C  isosorbide mononitrate (IMDUR) 120 MG 24 hr tablet Take 1 tablet (120 mg total) by mouth 2 (two) times daily. 09/05/17   Dunn, Nedra Hai, PA-C  linagliptin (TRADJENTA) 5 MG TABS tablet Take 1 tablet (5 mg total) by mouth daily. 04/03/17   Geradine Girt, DO  Multiple Vitamin (MULTIVITAMIN WITH MINERALS) TABS tablet Take 1 tablet by mouth daily.    [provider]  nateglinide (STARLIX) 120 MG tablet Take 120 mg 3 (three) times daily with meals by mouth.    [provider]  nitroGLYCERIN (NITROSTAT) 0.4 MG SL tablet Place 1 tablet (0.4 mg total) under the tongue every 5 (five) minutes as needed for chest pain. 10/01/17 12/30/17  End, Harrell Gave, MD  Omega-3 Fatty Acids (FISH OIL) 1000 MG CAPS Take 2,000 mg by mouth 2 (two) times daily.    [provider]  ranitidine (ZANTAC) 300 MG tablet Take 300 mg by mouth at bedtime.    [provider]  traMADol (ULTRAM) 50 MG tablet Take 50 mg by mouth every 8 (eight) hours as needed for moderate pain.     [provider]  traMADol (ULTRAM) 50 MG tablet Take 1  tablet (50 mg total) by mouth every 6 (six) hours as needed. 08/09/17   Gareth Morgan, MD  vitamin E 400 UNIT capsule Take 400 Units by mouth every evening.     [provider]    Family History Family History  Problem Relation Age of Onset  . Lung cancer Mother   . Anemia Mother   . Polycystic kidney disease Mother   . Diabetes Father   . Heart attack Father 55       Died suddenly  . Heart failure Father   . Hyperlipidemia Father   . Hypertension Father   . Kidney failure Sister 5       Died  . Heart attack Sister   . Hypertension Sister   . Diabetes Sister   . Diabetes Brother   . Polycystic kidney disease Brother   . Diabetes Sister     Social History Social History   Tobacco Use  . Smoking status: Former Smoker    Types: Cigarettes  . Smokeless tobacco: Never Used  . Tobacco comment: smoked some as a teenager (high school) not even a pack a week  Substance Use Topics  . Alcohol use: No    Alcohol/week: 0.0 oz    Comment: 09/15/2012 "drank a little when I was young"  . Drug use: No     Allergies   Ciprocin-fluocin-procin [fluocinolone acetonide]; Clarithromycin; Cleocin [clindamycin hcl]; Glimepiride [amaryl]; and Colchicine   Review of Systems Review of Systems  Constitutional: Negative for chills, diaphoresis and fever.  Respiratory: Positive for chest tightness and shortness of breath.   Cardiovascular: Positive for leg swelling.  Gastrointestinal: Negative for abdominal pain, nausea and vomiting.  Musculoskeletal: Positive for joint swelling.     Physical Exam Updated Vital Signs BP 133/78   Pulse 86   Temp 98.4 F (36.9 C) (Oral)   Resp (!) 29   Ht 5\' 5"  (1.651 m)   Wt 108.9 kg (240 lb)   SpO2 95%   BMI 39.94 kg/m   Physical Exam  Constitutional: He is oriented to person, place, and time. He appears well-developed and well-nourished. No distress.  HENT:  Head: Normocephalic and atraumatic.  Eyes: Conjunctivae are normal.    Neck: Normal range of motion. Neck supple. No JVD present.  Cardiovascular:  Normal rate and regular rhythm.  Pulmonary/Chest: Effort normal and breath sounds normal. No respiratory distress. He has no wheezes. He has no rales.  Abdominal: Soft. Bowel sounds are normal. There is no tenderness.  Musculoskeletal: He exhibits edema (+1 pitting edema lower extremities bilaterally).  Neurological: He is alert and oriented to person, place, and time.  Skin: Skin is warm.     ED Treatments / Results  Labs (all labs ordered are listed, but only abnormal results are displayed) Labs Reviewed  BASIC METABOLIC PANEL - Abnormal; Notable for the following components:      Result Value   CO2 18 (*)    Glucose, Bld 162 (*)    BUN 41 (*)    Creatinine, Ser 3.61 (*)    GFR calc non Af Amer 17 (*)    GFR calc Af Amer 20 (*)    All other components within normal limits  CBC - Abnormal; Notable for the following components:   RBC 3.34 (*)    Hemoglobin 9.4 (*)    HCT 28.9 (*)    RDW 16.3 (*)    All other components within normal limits  TROPONIN I - Abnormal; Notable for the following components:   Troponin I 0.12 (*)    All other components within normal limits  I-STAT TROPONIN, ED - Abnormal; Notable for the following components:   Troponin i, poc 0.13 (*)    All other components within normal limits  TROPONIN I  TROPONIN I    EKG  EKG Interpretation None       Radiology Dg Chest 2 View  Result Date: 10/15/2017 CLINICAL DATA:  Onset last night constant chest pain and shortness of breath EXAM: CHEST  2 VIEW COMPARISON:  08/09/2017 FINDINGS: Sternotomy wires overlie normal cardiac silhouette. Normal pulmonary vasculature. No effusion, infiltrate, or pneumothorax. No acute osseous abnormality. No acute cardiopulmonary process. IMPRESSION: No active cardiopulmonary disease. Electronically Signed   By: Suzy Bouchard M.D.   On: 10/15/2017 12:23    Procedures Procedures (including  critical care time)  Medications Ordered in ED Medications  nitroGLYCERIN (NITROSTAT) SL tablet 0.4 mg (not administered)  morphine 4 MG/ML injection 2 mg (2 mg Intravenous Given 10/15/17 1503)     Initial Impression / Assessment and Plan / ED Course  I have reviewed the triage vital signs and the nursing notes.  Pertinent labs & imaging results that were available during my care of the patient were reviewed by me and considered in my medical decision making (see chart for details).   57 yo male with extensive cardiac history presenting with typical chest pain and elevated I-stat troponin to 0.13, EKG without ST elevattions. Will trend troponin. Patient of Dr. Saunders Revel, will consult cardiology.  Repeat troponin I - 0.12. Patient continue to experience pressure like CP - gave 2 mg of morphine which improved his symptoms.   Cardiology to evaluate and admit patient.   Final Clinical Impressions(s) / ED Diagnoses   Final diagnoses:  Precordial chest pain  Chest pain, unspecified type  Elevated troponin    ED Discharge Orders    None       Arvil Chaco, MD     Melanee Spry, MD 10/15/17 [id number]    Margette Fast, MD 10/15/17 [phone]

## 2017-10-15 NOTE — Care Management Obs Status (Signed)
[facility] NOTIFICATION   Patient Details  Name: Matthew BONURA Sr. MRN: [id number] Date of Birth: 1960-11-14   Medicare Observation Status Notification Given:  Yes    Apolonio Schneiders, RN 10/15/2017, 7:18 PM

## 2017-10-15 NOTE — H&P (Signed)
Cardiology History & Physical    Patient ID: Matthew RATTIGAN Sr. MRN: [id number], DOB: 26-Apr-1961 Date of Encounter: 10/15/2017, 4:23 PM Primary Physician: Patient, No Pcp Per Primary Cardiologist: Dr. Saunders Revel  Chief Complaint: chest pain Reason for Admission: chest pain, elevated troponin Requesting MD: Dr. Aggie Hacker  HPI: Matthew Rowe. is a 57 y.o. 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  He has very complicated coronary history as above. Last cath was in 2015 with PCI as above. He has responded best to ranolazine in the past - he was unable to afford this in the past but had been able to in more recent times. He has been unable to afford cardiac rehabilitation. Last nuc in 11/2016 was abnormal with large fixed defect involving the basilar, mid and apical inferior wall with extension to the true cardiac apex consistent with region of prior infarct or scarring, dilated left ventricle with associated hypokinesis and near akinesis of the inferior wall, EF 35%, managed medically in light of advancing kidney disease. His most recent echo 12/2016 showed EF 45-50%, anterior, distal anteroseptal, apical and distal inferoapical akinesis suggestive of LAD territory ischemia/infarct, mild aortic stenosis, moderate LAE, mildly dilated RV with mildly reduced systolic function. He was admitted 03/2017 with NSTEMI (troponin 1.71). He was treated with IV heparin and was continued on aggressive antianginal regimen. Cardiac catheterization was discussed with Matthew Rowe but he declined due to his  chronic kidney disease. He was seen in the ED on 08/09/17 with chest pain and 08/11/17 with foot pain felt consisent with gout - troponins were negative and it was felt that his CP was exacerbated by stress of the gout pain.  He has continued to struggle with gout of both feet, and has been on prednisone for this. As a result he's been seeing blood sugars in the 400 range or so and was transitioned back to insulin. About 2 weeks ago he ran out of his Imdur but he restarted this last Thursday 1/31. Given his intermittent use of NTG spray, he's actually to put in a request for NTG patch through [facility] not realizing that he's actually already on the long-acting version. He has been using approximately 2 SL NTG every other day which he states is really dependent on the amount of activity he is doing. His angina seems primary exacerbated by walking up a hill. He also tends to notice worsening chest pain during times of high blood sugar, high blood pressure or emotional stress. He has had several appointments over the last few days at the [place] and otherwise, and also received some family news that was upsetting to him last night. As a result he has had increasing episodes of angina over the last 24 hours. He developed worsening CP/dyspnea last night and took NTG spray. This eased the pain but it came back around 3am prompting another dose. When he woke up for the day he felt OK, but as he gradually got back into his usual activities noticed more bothersome chest discomfort and dyspnea. This prompted him to see  care in the ER. Labs show POC troponin 0.13 and full troponin 0.12, BUN 41/Cr 3.61 similar to prior in 07/2017 which was trending up from prior baseline of 2.9-3.1, Hgb 9.4 (slightly down from baseline in 10 range), CXR NAD. He received morphine and is currently resting without recurrent angina. O2 sat is ranging 88-96 on RA and supplemental O2. He has no orthopnea or LEE aside from trace edema of the right forefoot  which he attributes to gout.   His wife states they follow with Dr. Justin Mend and have been introduced to the idea of peritoneal dialysis. They have not yet begun the process but he was scheduled to have upcoming intervention for this and his wife was to begin classes on how to perform.  Past Medical History:  Diagnosis Date  . Chronic anemia   . Chronic combined systolic and diastolic CHF (congestive heart failure) ([place])   . Chronic stable angina ([place])   . CKD (chronic kidney disease), stage IV ([place])   . Coronary artery disease    a. INF MI 1999: PCI of RCA;  b.  extensive stenting of LAD;  c. s/p CABG with RIMA->RCA in 2006;  d. Stable, Low risk MV 05/2012; e. 09/2012 Cath: stable anatomy->med Rx. f. Abnl nuc 11/2013 -> med rx; g. 11/2013 Cath/PCI: s/p DES to RCA. h. Abnormal nuc 11/2016, pt declined cath.   . DM2 (diabetes mellitus, type 2) ([place])   . Dyslipidemia   . GERD (gastroesophageal reflux disease)   . History of gout   . Hypertensive heart disease   . Ischemic cardiomyopathy   . Mild aortic stenosis   . Myocardial infarction [facility]) 1999; 2002; 2003; 2006; ~ 2008  . OSA on CPAP   . Polycystic kidney disease      Surgical History:  Past Surgical History:  Procedure Laterality Date  . CARDIAC CATHETERIZATION  2012  . CARDIAC CATHETERIZATION  2013  . CARDIAC CATHETERIZATION  2014   LHC (1/14): total occlusion of distal LAD, diffuse disease in ramus, 70% proximal LCx, 50% proximal and mid RCA, 50% distal RCA, patent RIMA-RCA.  Marland Kitchen CORONARY ANGIOPLASTY WITH STENT PLACEMENT  1999 / 2002 / 2004 / 2006?  . CORONARY ARTERY BYPASS GRAFT  2006   CABG X?; in [place]  . CYSTECTOMY  1990's   off face  . LEFT HEART CATHETERIZATION WITH CORONARY/GRAFT ANGIOGRAM N/A 09/16/2012   Procedure: LEFT HEART CATHETERIZATION WITH [name];  Surgeon: [name], MD;  Location: [facility] CATH LAB;  Service: Cardiovascular;  Laterality: N/A;  . LEFT HEART CATHETERIZATION WITH CORONARY/GRAFT  ANGIOGRAM N/A 12/02/2013   Procedure: LEFT HEART CATHETERIZATION WITH [name];  Surgeon: [name], MD;  Location: [place] CATH LAB;  Service: Cardiovascular;  Laterality: N/A;  . PERCUTANEOUS CORONARY STENT INTERVENTION (PCI-S) N/A 12/03/2013   Procedure: PERCUTANEOUS CORONARY STENT INTERVENTION (PCI-S);  Surgeon: [name], MD;  Location: [facility] C[facility] LAB;  Service: Cardiovascular;  Laterality: N/A;     Home Meds: Prior to Admission medications   Medication Sig Start Date End Date Taking? Authorizing Provider  acetaminophen (TYLENOL) 500 MG tablet Take 500 mg by mouth 3 (three) times daily as needed (for pain or headaches).    Yes [provider]  allopurinol (ZYLOPRIM) 100 MG tablet Take 100 mg by mouth every morning.   Yes [provider]  amLODipine (NORVASC) 10 MG tablet Take 1 tablet (10 mg total) by mouth daily. 01/12/15  Yes Copland, Gay Filler, MD  aspirin EC 325 MG EC  tablet Take 1 tablet (325 mg total) by mouth daily. 04/04/17  Yes Vann, Jessica U, DO  atorvastatin (LIPITOR) 80 MG tablet Take 1 tablet (80 mg total) by mouth daily. 08/26/17 11/24/17 Yes Dunn, Dayna N, PA-C  carvedilol (COREG) 25 MG tablet Take 2 tablets (50 mg total) by mouth 2 (two) times daily. 09/23/17  Yes End, Harrell Gave, MD  Cholecalciferol 2000 units TABS Take 2,000 Units by mouth every morning.   Yes [provider]  clopidogrel (PLAVIX) 75 MG tablet TAKE ONE TABLET BY MOUTH ONCE DAILY WITH BREAKFAST Patient taking differently: Take 75 mg by mouth once a day with breakfast 12/25/16  Yes End, Harrell Gave, MD  diphenhydrAMINE (BENADRYL) 25 MG tablet Take 25 mg by mouth every 6 (six) hours as needed for allergies or sleep.    Yes [provider]  furosemide (LASIX) 40 MG tablet Take 1 tablet (40 mg total) by mouth 2 (two) times daily. 09/23/17  Yes End, Harrell Gave, MD  gabapentin (NEURONTIN) 300 MG capsule Take 1 capsule (300 mg total) by mouth 2 (two) times  daily. Patient taking differently: Take 300 mg by mouth 3 (three) times daily.  04/03/17  Yes Eulogio Bear U, DO  hydrALAZINE (APRESOLINE) 50 MG tablet Take 1 tablet (50 mg total) by mouth 3 (three) times daily. 12/20/16  Yes End, Harrell Gave, MD  Insulin NPH Isophane & Regular (NOVOLIN 70/30 [place]) Inject 20 Units into the skin 2 (two) times daily.   Yes [provider]  isosorbide mononitrate (IMDUR) 120 MG 24 hr tablet Take 1 tablet (120 mg total) by mouth 2 (two) times daily. 08/26/17  Yes Dunn, Nedra Hai, PA-C  Misc. Devices MISC by Does not apply route. C-PAP   Yes [provider]  Multiple Vitamin (MULTIVITAMIN WITH MINERALS) TABS tablet Take 1 tablet by mouth daily.   Yes [provider]  nitroGLYCERIN (NITROSTAT) 0.4 MG SL tablet Place 1 tablet (0.4 mg total) under the tongue every 5 (five) minutes as needed for chest pain. 10/01/17 12/30/17 Yes End, Harrell Gave, MD  Omega-3 Fatty Acids (FISH OIL) 1000 MG CAPS Take 2,000 mg by mouth 2 (two) times daily.   Yes [provider]  pantoprazole (PROTONIX) 40 MG tablet Take 40 mg by mouth daily.    Yes [provider]  ranitidine (ZANTAC) 300 MG tablet Take 300 mg by mouth at bedtime.   Yes [provider]  ranolazine (RANEXA) 500 MG 12 hr tablet Take 1 tablet (500 mg total) by mouth 2 (two) times daily. 03/29/17  Yes End, Harrell Gave, MD  vitamin E 400 UNIT capsule Take 400 Units by mouth every evening.    Yes [provider]  zolpidem (AMBIEN) 10 MG tablet Take 10 mg by mouth at bedtime as needed. 09/30/17  Yes [provider]  HYDROcodone-acetaminophen (NORCO/VICODIN) 5-325 MG tablet Take 1 tablet by mouth every 6 (six) hours as needed. Patient not taking: Reported on 10/15/2017 08/11/17   Providence Lanius A, PA-C  isosorbide mononitrate (IMDUR) 120 MG 24 hr tablet Take 1 tablet (120 mg total) by mouth 2 (two) times daily. 09/05/17   Dunn, Nedra Hai, PA-C  linagliptin (TRADJENTA) 5 MG TABS  tablet Take 1 tablet (5 mg total) by mouth daily. Patient not taking: Reported on 10/15/2017 04/03/17   Geradine Girt, DO  traMADol (ULTRAM) 50 MG tablet Take 1 tablet (50 mg total) by mouth every 6 (six) hours as needed. 08/09/17   Gareth Morgan, MD    Allergies:  Allergies  Allergen Reactions  . Ciprocin-Fluocin-Procin [Fluocinolone Acetonide] Rash  . Clarithromycin Itching and Other (See Comments)    "Biaxin" Eyes itch and burn  . Cleocin [Clindamycin Hcl] Anaphylaxis and Swelling  . Glimepiride [Amaryl] Other (See Comments)    Elevates liver function   . Colchicine Other (See Comments)    Affected kidneys Affected kidneys Affected kidneys    Social History   Socioeconomic History  . Marital status: Married    Spouse name: [name]  . Number of children: 5  . Years of education: Not on file  . Highest education level: Not on file  Social Needs  . Financial resource strain: Not on file  . Food insecurity - worry: Not on file  . Food insecurity - inability: Not on file  . Transportation needs - medical: Not on file  . Transportation needs - non-medical: Not on file  Occupational History  . Occupation: retired    Comment: Chartered loss adjuster  Tobacco Use  . Smoking status: Former Smoker    Types: Cigarettes  . Smokeless tobacco: Never Used  . Tobacco comment: smoked some as a teenager (high school) not even a pack a week  Substance and Sexual Activity  . Alcohol use: No    Alcohol/week: 0.0 oz    Comment: 09/15/2012 "drank a little when I was young"  . Drug use: No  . Sexual activity: Yes  Other Topics Concern  . Not on file  Social History Narrative   Married and lives with wife in [place]. On disability.    Consumes about 2 Coke Zeros a day      Family History  Problem Relation Age of Onset  . Lung cancer Mother   . Anemia Mother   . Polycystic kidney disease Mother   . Diabetes Father   . Heart attack Father 29       Died suddenly  . Heart failure Father   .  Hyperlipidemia Father   . Hypertension Father   . Kidney failure Sister 5       Died  . Heart attack Sister   . Hypertension Sister   . Diabetes Sister   . Diabetes Brother   . Polycystic kidney disease Brother   . Diabetes Sister     Review of Systems: no bleeding, syncope, hematemesis. All other systems reviewed and are otherwise negative except as noted above.  Labs:   Lab Results  Component Value Date   WBC 10.0 10/15/2017   HGB 9.4 (L) 10/15/2017   HCT 28.9 (L) 10/15/2017   MCV 86.5 10/15/2017   PLT 211 10/15/2017    Recent Labs  Lab 10/15/17 1155  NA 138  K 4.6  CL 106  CO2 18*  BUN 41*  CREATININE 3.61*  CALCIUM 9.2  GLUCOSE 162*   Recent Labs    10/15/17 1257  TROPONINI 0.12*   Lab Results  Component Value Date   CHOL 146 04/01/2017   HDL 29 (L) 04/01/2017   LDLCALC 94 04/01/2017   TRIG 116 04/01/2017   Lab Results  Component Value Date   DDIMER 0.66 (H) 02/08/2017    Radiology/Studies:  Dg Chest 2 View  Result Date: 10/15/2017 CLINICAL DATA:  Onset last night constant chest pain and shortness of breath EXAM: CHEST  2 VIEW COMPARISON:  08/09/2017 FINDINGS: Sternotomy wires overlie normal cardiac silhouette. Normal pulmonary vasculature. No effusion, infiltrate, or pneumothorax. No acute osseous abnormality. No acute cardiopulmonary process. IMPRESSION: No active cardiopulmonary disease. Electronically Signed   By: Nicole Kindred  Leonia Reeves M.D.   On: 10/15/2017 12:23   Wt Readings from Last 3 Encounters:  10/15/17 240 lb (108.9 kg)  08/26/17 231 lb 6.4 oz (105 kg)  08/11/17 235 lb (106.6 kg)    EKG: NSR 88bpm prior anterolateral infarct and one PAC, similar to previous  Physical Exam: Blood pressure 133/78, pulse 86, temperature 98.4 F (36.9 C), temperature source Oral, resp. rate (!) 29, height 5\' 5"  (1.651 m), weight 240 lb (108.9 kg), SpO2 95 %. Body mass index is 39.94 kg/m. General: Well developed, well nourished obese AAM, in no acute  distress. Head: Normocephalic, atraumatic, sclera non-icteric, no xanthomas, nares are without discharge.  Neck: Negative for carotid bruits. JVD not elevated. Lungs: Clear bilaterally to auscultation without wheezes, rales, or rhonchi. Breathing is unlabored. Heart: RRR with S1 S2. No murmurs, rubs, or gallops appreciated. Abdomen: Soft, non-tender, non-distended with normoactive bowel sounds. No hepatomegaly. No rebound/guarding. No obvious abdominal masses. Msk:  Strength and tone appear normal for age. Extremities: No clubbing or cyanosis. Trace edema on right forefoot.  Distal pedal pulses are 2+ and equal bilaterally. Neuro: Alert and oriented X 3. No focal deficit. No facial asymmetry. Moves all extremities spontaneously. Psych:  Responds to questions appropriately with a normal affect.    Assessment and Plan   1. Unstable angina/possible recurrent NSTEMI with complex history of CAD as above - reviewed in depth with Dr. Irish Lack. Under ideal circumstances we should proceed with cardiac cath given presentation with NSTEMI in 03/2017 and now increased angina. However, his CKD is the limiting factor here. He understands there is a looming risk of dialysis in his future. He and his wife affirm they'd like to try and do what they can to prolong time to dialysis but are willing to proceed with cath if that's what is needed. They feel like his recent angina was exacerbated by being busier than usual due to multiple appointments as well as having to walk up a hill to get to his car. They have put in a request for management for relocation to a home without said hill. Dr. Irish Lack has encouraged them to discuss how they feel about proceeding with cath. Will involve nephrology for input on optimizing his status prior to making final decisions as well. Will continue ASA, Plavix. Per d/w Dr. Saunders Revel in the past he and the patient previously discussed cutting aspirin dose but they elected to defer. Continue  present doses of current medications otherwise. Add heparin per pharmacy and cycle troponins. Dr. Irish Lack believes if troponins remain flat then the idea of medical management can be entertained but if there is significant rise, will need to visit the idea of cath this admission.  2. Intermittent hypoxia - ? related to obesity. Continue supplemental O2 and follow. He is not tachycardic and has no pleuritic chest pain. Volume status difficult to assess given obesity but he actually has less edema on exam than when I saw him previously. Lungs and CXR are clear. BNP unlikely to be helpful given his renal disease.  3. CKD stage IV - see above. Spoke with renal for consultation (non-urgent as pt and wife are still discussing options).  4. Chronic combined CHF - as above. Not on ACEI/ARB/spiro given CKD.  5. Diabetes mellitus - continue home regimen of insulin and add SSI.   6. Gout - have asked nursing to page pharmacy to clarify the dose of prednisone he is taking. May need IM assistance with this if it is continued and blood  sugars spike again.  7. Anemia - no bleeding reported. I suspect related to CKD. Hemoccult stools. Appreciate renal input.  Severity of Illness: The appropriate patient status for this patient is OBSERVATION. Observation status is judged to be reasonable and necessary in order to provide the required intensity of service to ensure the patient's safety. The patient's presenting symptoms, physical exam findings, and initial radiographic and laboratory data in the context of their medical condition is felt to place them at decreased risk for further clinical deterioration. Furthermore, it is anticipated that the patient will be medically stable for discharge from the hospital within 2 midnights of admission. The following factors support the patient status of observation.   " The patient's presenting symptoms include chest pain. " The physical exam findings include obesity,  hypoxia. " The initial radiographic and laboratory data are elevated troponin.  Pt currently listed as observation as it is not clear if the plan will be continue medical therapy/discharge tomorrow or cath/further hospital stay (contingent on enzymes and patient decision). If he stays another night, will need updated status to inpatient.    For questions or updates, please contact [place] Please consult [email or web address] for contact info under Cardiology/STEMI.  Signed, Charlie Pitter, PA-C 10/15/2017, 4:23 PM   I have examined the patient and reviewed assessment and plan and discussed with patient.  Agree with above as stated.  Patient with extensive CAD, renal dysfunction who has had unstable angina.  He has had a lot of stress at home including living in a place where he ahs to walk up a hill.  THis has been difficult.  Cath has been delayed in the past in the hopes of avoiding dialysis.  Cr has increased to 3.6.    We discussed cath vs. Continued medical therapy.  THe wife expects some of the family stress to improve soon and she thinks this will make him feel better. If troponin spikes, they would consider cath.  If it just stays at a low level, they will likely prefer medical therapy.  Troponin was up to 1.5 in July 2018 and cath was deferred at that time.  We discussed the risks and benefots of both strategies.  THey are in the process of getting setup for peritoneal dialysis.    THey would like to discuss with nephrology as well. I do think that given his prior CABG and advanced renal failure does increase his risk of CIN.  More vessels to image due to CABG means more dye will need to be used.   Larae Grooms

## 2017-10-15 NOTE — Progress Notes (Signed)
ANTICOAGULATION CONSULT NOTE - Initial Consult  Pharmacy Consult for heparin Indication: chest pain/ACS  Allergies  Allergen Reactions  . Ciprocin-Fluocin-Procin [Fluocinolone Acetonide] Rash  . Clarithromycin Itching and Other (See Comments)    "Biaxin" Eyes itch and burn  . Cleocin [Clindamycin Hcl] Anaphylaxis and Swelling  . Glimepiride [Amaryl] Other (See Comments)    Elevates liver function   . Colchicine Other (See Comments)    Affected kidneys Affected kidneys Affected kidneys    Patient Measurements: Height: 5\' 5"  (165.1 cm) Weight: 240 lb (108.9 kg) IBW/kg (Calculated) : 61.5 Heparin Dosing Weight: 86.5 kg  Assessment: 57 yo M presents on 2/5 with CP. Pharmacy consulted for heparin. No anticoag PTA. Hgb 9.4, plts wnl.  Goal of Therapy:  Heparin level 0.3-0.7 units/ml Monitor platelets by anticoagulation protocol: Yes   Plan:  Give heparin 4,000 unit bolus Start heparin gtt at 1,100 units/hr Monitor daily heparin level, CBC, s/s of bleed  Avion Patella J 10/15/2017,5:45 PM

## 2017-10-15 NOTE — Care Management Note (Signed)
Case Management Note  Patient Details  Name: GOTTFRIED STANDISH Sr. MRN: [id number] Date of Birth: 1961/06/06  Subjective/Objective:                  Chest Pain  Action/Plan: [facility] spoke with the patient at the bedside. Patient reports he lives with his wife. She is able to assist him as needed. He does not use any DME. Explained [facility]. Patient denies having any questions.   Expected Discharge Date:    unknown              Expected Discharge Plan:  Home/Self Care  In-House Referral:     Discharge planning Services  CM Consult  Post Acute Care Choice:    Choice offered to:     DME Arranged:    DME Agency:     HH Arranged:    HH Agency:     Status of Service:  In process, will continue to follow  If discussed at Long Length of Stay Meetings, dates discussed:    Additional Comments:  Apolonio Schneiders, RN 10/15/2017, 7:31 PM

## 2017-10-16 ENCOUNTER — Observation Stay (HOSPITAL_COMMUNITY): Payer: Medicare HMO

## 2017-10-16 ENCOUNTER — Other Ambulatory Visit: Payer: Self-pay

## 2017-10-16 ENCOUNTER — Encounter (HOSPITAL_COMMUNITY): Payer: Self-pay | Admitting: General Practice

## 2017-10-16 ENCOUNTER — Observation Stay (HOSPITAL_BASED_OUTPATIENT_CLINIC_OR_DEPARTMENT_OTHER): Payer: Medicare HMO

## 2017-10-16 DIAGNOSIS — M109 Gout, unspecified: Secondary | ICD-10-CM | POA: Diagnosis present

## 2017-10-16 DIAGNOSIS — I5043 Acute on chronic combined systolic (congestive) and diastolic (congestive) heart failure: Secondary | ICD-10-CM | POA: Diagnosis present

## 2017-10-16 DIAGNOSIS — Z794 Long term (current) use of insulin: Secondary | ICD-10-CM | POA: Diagnosis not present

## 2017-10-16 DIAGNOSIS — I350 Nonrheumatic aortic (valve) stenosis: Secondary | ICD-10-CM | POA: Diagnosis present

## 2017-10-16 DIAGNOSIS — D631 Anemia in chronic kidney disease: Secondary | ICD-10-CM | POA: Diagnosis present

## 2017-10-16 DIAGNOSIS — Z79899 Other long term (current) drug therapy: Secondary | ICD-10-CM | POA: Diagnosis not present

## 2017-10-16 DIAGNOSIS — Z951 Presence of aortocoronary bypass graft: Secondary | ICD-10-CM | POA: Diagnosis not present

## 2017-10-16 DIAGNOSIS — Z9989 Dependence on other enabling machines and devices: Secondary | ICD-10-CM | POA: Diagnosis not present

## 2017-10-16 DIAGNOSIS — I5033 Acute on chronic diastolic (congestive) heart failure: Secondary | ICD-10-CM | POA: Diagnosis not present

## 2017-10-16 DIAGNOSIS — I340 Nonrheumatic mitral (valve) insufficiency: Secondary | ICD-10-CM

## 2017-10-16 DIAGNOSIS — R079 Chest pain, unspecified: Secondary | ICD-10-CM | POA: Diagnosis not present

## 2017-10-16 DIAGNOSIS — I5042 Chronic combined systolic (congestive) and diastolic (congestive) heart failure: Secondary | ICD-10-CM | POA: Diagnosis not present

## 2017-10-16 DIAGNOSIS — Z6838 Body mass index (BMI) 38.0-38.9, adult: Secondary | ICD-10-CM | POA: Diagnosis not present

## 2017-10-16 DIAGNOSIS — Z87891 Personal history of nicotine dependence: Secondary | ICD-10-CM | POA: Diagnosis not present

## 2017-10-16 DIAGNOSIS — I255 Ischemic cardiomyopathy: Secondary | ICD-10-CM | POA: Diagnosis present

## 2017-10-16 DIAGNOSIS — N184 Chronic kidney disease, stage 4 (severe): Secondary | ICD-10-CM | POA: Diagnosis present

## 2017-10-16 DIAGNOSIS — E1122 Type 2 diabetes mellitus with diabetic chronic kidney disease: Secondary | ICD-10-CM | POA: Diagnosis present

## 2017-10-16 DIAGNOSIS — Z955 Presence of coronary angioplasty implant and graft: Secondary | ICD-10-CM | POA: Diagnosis not present

## 2017-10-16 DIAGNOSIS — Q613 Polycystic kidney, unspecified: Secondary | ICD-10-CM | POA: Diagnosis not present

## 2017-10-16 DIAGNOSIS — Z7982 Long term (current) use of aspirin: Secondary | ICD-10-CM | POA: Diagnosis not present

## 2017-10-16 DIAGNOSIS — R0602 Shortness of breath: Secondary | ICD-10-CM | POA: Diagnosis present

## 2017-10-16 DIAGNOSIS — I361 Nonrheumatic tricuspid (valve) insufficiency: Secondary | ICD-10-CM | POA: Diagnosis not present

## 2017-10-16 DIAGNOSIS — E1121 Type 2 diabetes mellitus with diabetic nephropathy: Secondary | ICD-10-CM | POA: Diagnosis not present

## 2017-10-16 DIAGNOSIS — E785 Hyperlipidemia, unspecified: Secondary | ICD-10-CM | POA: Diagnosis present

## 2017-10-16 DIAGNOSIS — R748 Abnormal levels of other serum enzymes: Secondary | ICD-10-CM | POA: Diagnosis not present

## 2017-10-16 DIAGNOSIS — E872 Acidosis: Secondary | ICD-10-CM | POA: Diagnosis present

## 2017-10-16 DIAGNOSIS — Z7902 Long term (current) use of antithrombotics/antiplatelets: Secondary | ICD-10-CM | POA: Diagnosis not present

## 2017-10-16 DIAGNOSIS — K219 Gastro-esophageal reflux disease without esophagitis: Secondary | ICD-10-CM | POA: Diagnosis present

## 2017-10-16 DIAGNOSIS — Z9981 Dependence on supplemental oxygen: Secondary | ICD-10-CM | POA: Diagnosis not present

## 2017-10-16 DIAGNOSIS — G4733 Obstructive sleep apnea (adult) (pediatric): Secondary | ICD-10-CM | POA: Diagnosis present

## 2017-10-16 DIAGNOSIS — I252 Old myocardial infarction: Secondary | ICD-10-CM | POA: Diagnosis not present

## 2017-10-16 DIAGNOSIS — I25110 Atherosclerotic heart disease of native coronary artery with unstable angina pectoris: Secondary | ICD-10-CM | POA: Diagnosis present

## 2017-10-16 DIAGNOSIS — I130 Hypertensive heart and chronic kidney disease with heart failure and stage 1 through stage 4 chronic kidney disease, or unspecified chronic kidney disease: Secondary | ICD-10-CM | POA: Diagnosis present

## 2017-10-16 LAB — CBC
HCT: 30.3 % — ABNORMAL LOW (ref 39.0–52.0)
HEMOGLOBIN: 9.8 g/dL — AB (ref 13.0–17.0)
MCH: 28 pg (ref 26.0–34.0)
MCHC: 32.3 g/dL (ref 30.0–36.0)
MCV: 86.6 fL (ref 78.0–100.0)
Platelets: 231 10*3/uL (ref 150–400)
RBC: 3.5 MIL/uL — ABNORMAL LOW (ref 4.22–5.81)
RDW: 16.1 % — ABNORMAL HIGH (ref 11.5–15.5)
WBC: 14.4 10*3/uL — ABNORMAL HIGH (ref 4.0–10.5)

## 2017-10-16 LAB — GLUCOSE, CAPILLARY
GLUCOSE-CAPILLARY: 172 mg/dL — AB (ref 65–99)
GLUCOSE-CAPILLARY: 202 mg/dL — AB (ref 65–99)
Glucose-Capillary: 139 mg/dL — ABNORMAL HIGH (ref 65–99)

## 2017-10-16 LAB — LIPID PANEL
CHOL/HDL RATIO: 3.9 ratio
CHOLESTEROL: 161 mg/dL (ref 0–200)
HDL: 41 mg/dL (ref >40)
LDL Cholesterol: 80 mg/dL (ref 0–99)
Triglycerides: 201 mg/dL — ABNORMAL HIGH (ref <150)
VLDL: 40 mg/dL (ref 0–40)

## 2017-10-16 LAB — HEPARIN LEVEL (UNFRACTIONATED)
HEPARIN UNFRACTIONATED: 0.16 [IU]/mL — AB (ref 0.30–0.70)
HEPARIN UNFRACTIONATED: 0.43 [IU]/mL (ref 0.30–0.70)
Heparin Unfractionated: 0.46 IU/mL (ref 0.30–0.70)

## 2017-10-16 LAB — TROPONIN I: TROPONIN I: 0.1 ng/mL — AB (ref <0.03)

## 2017-10-16 LAB — BASIC METABOLIC PANEL
Anion gap: 12 (ref 5–15)
BUN: 41 mg/dL — ABNORMAL HIGH (ref 6–20)
CHLORIDE: 104 mmol/L (ref 101–111)
CO2: 20 mmol/L — AB (ref 22–32)
CREATININE: 3.55 mg/dL — AB (ref 0.61–1.24)
Calcium: 8.7 mg/dL — ABNORMAL LOW (ref 8.9–10.3)
GFR calc non Af Amer: 18 mL/min — ABNORMAL LOW (ref >60)
GFR, EST AFRICAN AMERICAN: 20 mL/min — AB (ref >60)
Glucose, Bld: 128 mg/dL — ABNORMAL HIGH (ref 65–99)
POTASSIUM: 4.1 mmol/L (ref 3.5–5.1)
Sodium: 136 mmol/L (ref 135–145)

## 2017-10-16 LAB — CBG MONITORING, ED: GLUCOSE-CAPILLARY: 155 mg/dL — AB (ref 65–99)

## 2017-10-16 LAB — MRSA PCR SCREENING: MRSA BY PCR: NEGATIVE

## 2017-10-16 MED ORDER — BENZONATATE 100 MG PO CAPS
100.0000 mg | ORAL_CAPSULE | Freq: Once | ORAL | Status: AC
  Administered 2017-10-16: 100 mg via ORAL
  Filled 2017-10-16: qty 1

## 2017-10-16 MED ORDER — PERFLUTREN LIPID MICROSPHERE
INTRAVENOUS | Status: AC
  Administered 2017-10-16: 2 mL via INTRAVENOUS
  Filled 2017-10-16: qty 10

## 2017-10-16 MED ORDER — PERFLUTREN LIPID MICROSPHERE
1.0000 mL | INTRAVENOUS | Status: AC | PRN
  Administered 2017-10-16: 2 mL via INTRAVENOUS
  Filled 2017-10-16: qty 10

## 2017-10-16 MED ORDER — FUROSEMIDE 10 MG/ML IJ SOLN
40.0000 mg | Freq: Once | INTRAMUSCULAR | Status: AC
  Administered 2017-10-16: 40 mg via INTRAVENOUS
  Filled 2017-10-16: qty 4

## 2017-10-16 MED ORDER — FUROSEMIDE 10 MG/ML IJ SOLN: 40.0000 mg | Freq: Every day | INTRAMUSCULAR | Status: DC

## 2017-10-16 MED ORDER — HEPARIN BOLUS VIA INFUSION
2000.0000 [IU] | Freq: Once | INTRAVENOUS | Status: AC
  Administered 2017-10-16: 2000 [IU] via INTRAVENOUS
  Filled 2017-10-16: qty 2000

## 2017-10-16 NOTE — ED Notes (Signed)
SDU 

## 2017-10-16 NOTE — Progress Notes (Signed)
Pt began trial off of Bipap @ 1330. HFNC 12 LPM. Pt has tolerated well with sats in the 90's and no signs of distress. Since last administration of IV lasix pt has had 900 ml output and lung sounds have improved. Will continue to monitor.

## 2017-10-16 NOTE — Progress Notes (Signed)
RT placed BIPAP on pt through [facility] because pt was hypoxic and was not receiving enough pressure. Pt had increased WOB and was starting to become diaphoretic Pt tolerating bipap well. Rt to cont to monitor.

## 2017-10-16 NOTE — ED Notes (Signed)
CPAP mask continually pops off due to small sizing, pt desats to 80's w/ no mask in plac. Respiratory called, at bedside at this time to place new mask for pt.

## 2017-10-16 NOTE — Progress Notes (Signed)
Progress Note  Patient Name: Matthew ZAPPONE Sr. Date of Encounter: 10/16/2017  Primary Cardiologist: No primary care provider on file.   Subjective   States he has some chest pain after coughing.  Currently on bipap  Inpatient Medications    Scheduled Meds: . allopurinol  100 mg Oral QHS  . amLODipine  10 mg Oral Daily  . aspirin  325 mg Oral Daily  . atorvastatin  80 mg Oral Daily  . carvedilol  50 mg Oral BID  . cholecalciferol  2,000 Units Oral BH-q7a  . clopidogrel  75 mg Oral Once  . famotidine  40 mg Oral QHS  . furosemide  40 mg Oral Daily  . gabapentin  300 mg Oral TID  . hydrALAZINE  50 mg Oral TID  . insulin aspart  0-9 Units Subcutaneous TID WC  . insulin aspart protamine- aspart  20 Units Subcutaneous BID WC  . isosorbide mononitrate  120 mg Oral BID  . multivitamin  1 tablet Oral QHS  . omega-3 acid ethyl esters  2 g Oral BID  . pantoprazole  40 mg Oral Daily  . ranolazine  500 mg Oral BID   Continuous Infusions: . heparin 1,400 Units/hr (10/16/17 0944)   PRN Meds: acetaminophen, nitroGLYCERIN, ondansetron (ZOFRAN) IV, traMADol   Vital Signs    Vitals:   10/16/17 0930 10/16/17 0945 10/16/17 1000 10/16/17 1015  BP: (!) 148/86 (!) 148/86 (!) 142/81 (!) 147/83  Pulse: 94 95 93 94  Resp: (!) 27 (!) 37 (!) 29 (!) 27  Temp:      TempSrc:      SpO2: 100% 100% 99% 99%  Weight:      Height:        Intake/Output Summary (Last 24 hours) at 10/16/2017 1119 Last data filed at 10/16/2017 0636 Gross per 24 hour  Intake -  Output 2300 ml  Net -2300 ml   Filed Weights   10/15/17 1117  Weight: 108.9 kg (240 lb)    Telemetry    NSR- Personally Reviewed  ECG    NSR, left axis deviation, premature atrial complex - Personally Reviewed  Physical Exam   GEN: No acute distress.   Neck: No JVD Cardiac: RRR, no murmurs, rubs, or gallops.  Respiratory: faint bibasilar crackles. GI: Soft, nontender, non-distended  MS: No edema; No deformity. Neuro:   Nonfocal  Psych: Normal affect   Labs    Chemistry Recent Labs  Lab 10/15/17 1155 10/15/17 2033 10/16/17 0258  NA 138  --  136  K 4.6  --  4.1  CL 106  --  104  CO2 18*  --  20*  GLUCOSE 162*  --  128*  BUN 41*  --  41*  CREATININE 3.61*  --  3.55*  CALCIUM 9.2  --  8.7*  PROT  --  6.3*  --   ALBUMIN  --  3.5  --   AST  --  40  --   ALT  --  17  --   ALKPHOS  --  82  --   BILITOT  --  1.2  --   GFRNONAA 17*  --  18*  GFRAA 20*  --  20*  ANIONGAP 14  --  12     Hematology Recent Labs  Lab 10/15/17 1155 10/16/17 0258  WBC 10.0 14.4*  RBC 3.34* 3.50*  HGB 9.4* 9.8*  HCT 28.9* 30.3*  MCV 86.5 86.6  MCH 28.1 28.0  MCHC 32.5 32.3  RDW 16.3* 16.1*  PLT 211 231    Cardiac Enzymes Recent Labs  Lab 10/15/17 1257 10/15/17 2033 10/16/17 0057  TROPONINI 0.12* 0.11* 0.10*    Recent Labs  Lab 10/15/17 1201  TROPIPOC 0.13*     BNPNo results for input(s): BNP, PROBNP in the last 168 hours.   DDimer No results for input(s): DDIMER in the last 168 hours.   Radiology    Dg Chest 2 View  Result Date: 10/15/2017 CLINICAL DATA:  Onset last night constant chest pain and shortness of breath EXAM: CHEST  2 VIEW COMPARISON:  08/09/2017 FINDINGS: Sternotomy wires overlie normal cardiac silhouette. Normal pulmonary vasculature. No effusion, infiltrate, or pneumothorax. No acute osseous abnormality. No acute cardiopulmonary process. IMPRESSION: No active cardiopulmonary disease. Electronically Signed   By: Suzy Bouchard M.D.   On: 10/15/2017 12:23   Dg Chest Port 1 View  Result Date: 10/16/2017 CLINICAL DATA:  Acute onset of generalized chest pain and shortness of breath. EXAM: PORTABLE CHEST 1 VIEW COMPARISON:  Chest radiograph performed 10/15/2017 FINDINGS: New bilateral airspace opacification is noted, right greater than left, which may reflect asymmetric pulmonary edema or pneumonia. A small right pleural effusion is suspected. No pneumothorax is seen. The  cardiomediastinal silhouette is borderline enlarged. The patient is status post median sternotomy. There is a chronic fracture of the fourth sternal wire. No acute osseous abnormalities are identified. IMPRESSION: New bilateral airspace opacification, right greater than left, which may reflect asymmetric pulmonary edema or pneumonia. Suspect small right pleural effusion. Borderline cardiomegaly. Electronically Signed   By: Garald Balding M.D.   On: 10/16/2017 03:44    Cardiac Studies   Last cath was in 2015 with PCI  Last nuc in 11/2016 was abnormal with large fixed defect involving the basilar, mid and apical inferior wall with extension to the true cardiac apex consistent with region of prior infarct or scarring, dilated left ventricle with associated hypokinesis and near akinesis of the inferior wall, EF 35%, managed medically in light of advancing kidney disease. His most recent echo 12/2016 showed EF 45-50%, anterior, distal anteroseptal, apical and distal inferoapical akinesis suggestive of LAD territory ischemia/infarct, mild aortic stenosis, moderate LAE, mildly dilated RV with mildly reduced systolic function.  Patient Profile     57 y.o. 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  He presented yesterday with chest pain. Troponins were elevated at 0.12 > 0.11 >0.10, however have remained flat.  Echo scheduled for today.  Elevated troponin  likely from HF and CKD.  At this point can continue with medical management as the risk of dialysis is high if precedes with cath. - asa, plavix - atorvastatin 80mg  - carvedilol 50mg  BID - ranexa 500mg   CKD stage IV  Creatinine currently 3.55, baseline around 2.5-3.0.  Essential hypertension Stable, currently taking carvedilol 50mg  BID, Imdur 120mg , hydralazine 50 TID  Shortness of breath Patient has a history of diastolic congestive  heart failure.  Not on ACEI/ARB/spiro given CKD.  Currently having difficulty breathing.  On bipap.  He has pulmonary edema and small right pleural effusion on chest x-ray.  Faint bibasilar crackles noted on exam.  Will continue to diurese.   DM Continuing home insulin with addition of SSI   For questions or updates, please contact [place] Please consult [email or web address] for contact info under Cardiology/STEMI.      Signed, Boyd Kerbs, DO  10/16/2017, 11:19 AM    I have examined the patient and reviewed assessment and plan and discussed with patient.  Agree with above as stated.  Troponins are low with a flat trend.  I think this most likely represents fluid overload, as shown on his CXR.  No angina. Would not pursue cath at this time, and will given an additional dose of Lasix as he is still on BiPap.  Will have to watch electrolytes closely.  Larae Grooms

## 2017-10-16 NOTE — ED Notes (Signed)
Tried patient off Bipap, patient oz 79-85 patient placed back on bipap MD page to make aware.

## 2017-10-16 NOTE — ED Notes (Signed)
Ordered hospital bed 

## 2017-10-16 NOTE — ED Notes (Signed)
While resp at bedside changing CPAP mask, pt became acutely hypoxic to 70s. Admitted cardiologist coverage paged and en route to department. Respiratory placed pt on BiPAP w/ improvement to high 80s and eventually 97%.

## 2017-10-16 NOTE — Progress Notes (Signed)
Noted recent lab and radiology reports, received report from ED RRT.  Weaned FIO2 slightly.  Noted diuresis with Lasix administration.  If patient able to tolerate, will trial off with HFNC support.  Wean O2 as tolerated.

## 2017-10-16 NOTE — Progress Notes (Signed)
[facility] Progress Note    Assessment/ Plan:   1. NSTEMI: being medically managed for now, patient on heparin gtt. Patient and wife aware there is risk of CIN/requiring HD if cath needed, and they are understanding and willing to proceed if needed. Management per cardiology.  2.  CKD IV: secondary to PKD.  Cr is 3.55 from 3.6 yesterday, better than previous value gotten at [facility] of > 4.  BUN 41. UOP overnight -2.3L.  3.  Combined systolic and diastolic CHF-->  Lasix 40 mg daily has been ordered per cardiology.  4.  DM II: per primary  5.  Mild metabolic acidosis: if persistent on labs here as inpt need to start sodium bicarb PO.   Subjective:   Patient sleeping in ED bed when I went to see him. His wife says he got his xanax late and is still drowsy. Patient wakes and answers questions appropriately. CPAP in place for OSA. Currently denying CP or dyspnea.   Objective:   BP (!) 147/83   Pulse 94   Temp 98.4 F (36.9 C) (Oral)   Resp (!) 27   Ht 5\' 5"  (1.651 m)   Wt 240 lb (108.9 kg)   SpO2 99%   BMI 39.94 kg/m   Intake/Output Summary (Last 24 hours) at 10/16/2017 1106 Last data filed at 10/16/2017 0636 Gross per 24 hour  Intake -  Output 2300 ml  Net -2300 ml   Weight change:   Physical Exam: Gen:NAD, sleeping, comfortable CVS:RRR, no m/r/g Resp:difficult to auscultate over CPAP mechanical sounds, however CTA bil, no W/R/R OZD:GUYQ, nt.nd Ext: no LE edema bil  Imaging: Dg Chest 2 View  Result Date: 10/15/2017 CLINICAL DATA:  Onset last night constant chest pain and shortness of breath EXAM: CHEST  2 VIEW COMPARISON:  08/09/2017 FINDINGS: Sternotomy wires overlie normal cardiac silhouette. Normal pulmonary vasculature. No effusion, infiltrate, or pneumothorax. No acute osseous abnormality. No acute cardiopulmonary process. IMPRESSION: No active cardiopulmonary disease. Electronically Signed   By: Suzy Bouchard M.D.   On: 10/15/2017 12:23   Dg Chest  Port 1 View  Result Date: 10/16/2017 CLINICAL DATA:  Acute onset of generalized chest pain and shortness of breath. EXAM: PORTABLE CHEST 1 VIEW COMPARISON:  Chest radiograph performed 10/15/2017 FINDINGS: New bilateral airspace opacification is noted, right greater than left, which may reflect asymmetric pulmonary edema or pneumonia. A small right pleural effusion is suspected. No pneumothorax is seen. The cardiomediastinal silhouette is borderline enlarged. The patient is status post median sternotomy. There is a chronic fracture of the fourth sternal wire. No acute osseous abnormalities are identified. IMPRESSION: New bilateral airspace opacification, right greater than left, which may reflect asymmetric pulmonary edema or pneumonia. Suspect small right pleural effusion. Borderline cardiomegaly. Electronically Signed   By: Garald Balding M.D.   On: 10/16/2017 03:44    Labs: BMET Recent Labs  Lab 10/15/17 1155 10/16/17 0258  NA 138 136  K 4.6 4.1  CL 106 104  CO2 18* 20*  GLUCOSE 162* 128*  BUN 41* 41*  CREATININE 3.61* 3.55*  CALCIUM 9.2 8.7*   CBC Recent Labs  Lab 10/15/17 1155 10/16/17 0258  WBC 10.0 14.4*  HGB 9.4* 9.8*  HCT 28.9* 30.3*  MCV 86.5 86.6  PLT 211 231    Medications:    . allopurinol  100 mg Oral QHS  . amLODipine  10 mg Oral Daily  . aspirin  325 mg Oral Daily  . atorvastatin  80 mg Oral Daily  .  carvedilol  50 mg Oral BID  . cholecalciferol  2,000 Units Oral BH-q7a  . clopidogrel  75 mg Oral Once  . famotidine  40 mg Oral QHS  . furosemide  40 mg Oral Daily  . gabapentin  300 mg Oral TID  . hydrALAZINE  50 mg Oral TID  . insulin aspart  0-9 Units Subcutaneous TID WC  . insulin aspart protamine- aspart  20 Units Subcutaneous BID WC  . isosorbide mononitrate  120 mg Oral BID  . multivitamin  1 tablet Oral QHS  . omega-3 acid ethyl esters  2 g Oral BID  . pantoprazole  40 mg Oral Daily  . ranolazine  500 mg Oral BID   Everrett Coombe, MD  PGY2 10/16/2017, 11:06 AM

## 2017-10-16 NOTE — Progress Notes (Signed)
  Echocardiogram 2D Echocardiogram has been performed.  Matthew Rowe 10/16/2017, 3:40 PM

## 2017-10-16 NOTE — Progress Notes (Signed)
Transported to 6E on BIPAP with RN without complications.

## 2017-10-16 NOTE — ED Notes (Signed)
Called and spoke with admitting MD  To see if patient can come off Bipap to eat and advised could do a trial.

## 2017-10-16 NOTE — Progress Notes (Signed)
Placed patient on CPAP via auto-mode with minimal pressure set at 8cm and maximum pressure set sat 25cm. Oxygen set at 7lpm with Sp02=93%.

## 2017-10-16 NOTE — Progress Notes (Signed)
[place] for Heparin Indication: chest pain/ACS  Allergies  Allergen Reactions  . Ciprocin-Fluocin-Procin [Fluocinolone Acetonide] Rash  . Clarithromycin Itching and Other (See Comments)    "Biaxin" Eyes itch and burn  . Cleocin [Clindamycin Hcl] Anaphylaxis and Swelling  . Glimepiride [Amaryl] Other (See Comments)    Elevates liver function   . Colchicine Other (See Comments)    Affected kidneys Affected kidneys Affected kidneys    Patient Measurements: Height: 5\' 5"  (165.1 cm) Weight: 240 lb (108.9 kg) IBW/kg (Calculated) : 61.5 Heparin Dosing Weight: 85 kg  Vital Signs: Temp: 100.8 F (38.2 C) (02/06 1137) Temp Source: Axillary (02/06 1137) BP: 140/82 (02/06 1137) Pulse Rate: 98 (02/06 1137)  Labs: Recent Labs    10/15/17 1155 10/15/17 1257 10/15/17 2033 10/16/17 0057 10/16/17 0258 10/16/17 0804 10/16/17 1129  HGB 9.4*  --   --   --  9.8*  --   --   HCT 28.9*  --   --   --  30.3*  --   --   PLT 211  --   --   --  231  --   --   LABPROT  --   --  13.1  --   --   --   --   INR  --   --  1.00  --   --   --   --   HEPARINUNFRC  --   --   --   --  0.16* 0.46 0.43  CREATININE 3.61*  --   --   --  3.55*  --   --   TROPONINI  --  0.12* 0.11* 0.10*  --   --   --     Estimated Creatinine Clearance: 26.1 mL/min (A) (by C-G formula based on SCr of 3.55 mg/dL (H)).  Assessment: 57 y.o. male with chest pain continues on heparin Heparin level now therapeutic  Goal of Therapy:  Heparin level 0.3-0.7 units/ml Monitor platelets by anticoagulation protocol: Yes   Plan:  Heparin to 1500 units / hr to prevent drop <0.3 Daily heparin level, CBC  Thank you Anette Guarneri, PharmD [phone]  10/16/2017,12:31 PM

## 2017-10-16 NOTE — ED Notes (Signed)
Pt CBG was 155, notified Janee'(RN)

## 2017-10-16 NOTE — ED Notes (Signed)
Assumed pt care at this time. Pt noted to be seated at edge of bed in tripod position w/ RR of 24-26. Pt is markedly SOB. Wife reports pt normally on home CPAP, which she attempted to place him on here w/ no improvement of O2 sat. Pt arrives to room on [place] 4L, satting 85-87%. Respiratory arrived at bedside at this time w/ CPAP machine. Plan to place pt on CPAP and monitor for improvement. Respiratory remains at bedside at this time

## 2017-10-16 NOTE — Progress Notes (Signed)
[place] for Heparin Indication: chest pain/ACS  Allergies  Allergen Reactions  . Ciprocin-Fluocin-Procin [Fluocinolone Acetonide] Rash  . Clarithromycin Itching and Other (See Comments)    "Biaxin" Eyes itch and burn  . Cleocin [Clindamycin Hcl] Anaphylaxis and Swelling  . Glimepiride [Amaryl] Other (See Comments)    Elevates liver function   . Colchicine Other (See Comments)    Affected kidneys Affected kidneys Affected kidneys    Patient Measurements: Height: 5\' 5"  (165.1 cm) Weight: 240 lb (108.9 kg) IBW/kg (Calculated) : 61.5 Heparin Dosing Weight: 85 kg  Vital Signs: BP: 143/90 (02/06 0315) Pulse Rate: 96 (02/06 0315)  Labs: Recent Labs    10/15/17 1155 10/15/17 1257 10/15/17 2033 10/16/17 0057 10/16/17 0258  HGB 9.4*  --   --   --  9.8*  HCT 28.9*  --   --   --  30.3*  PLT 211  --   --   --  231  LABPROT  --   --  13.1  --   --   INR  --   --  1.00  --   --   HEPARINUNFRC  --   --   --   --  0.16*  CREATININE 3.61*  --   --   --   --   TROPONINI  --  0.12* 0.11* 0.10*  --     Estimated Creatinine Clearance: 25.7 mL/min (A) (by C-G formula based on SCr of 3.61 mg/dL (H)).  Assessment: 57 y.o. male with chest pain for heparin  Goal of Therapy:  Heparin level 0.3-0.7 units/ml Monitor platelets by anticoagulation protocol: Yes   Plan:  Heparin 2000 units IV bolus, then increase heparin 1400 units/hr Check heparin level in 8 hours.   Caryl Pina 10/16/2017,3:47 AM

## 2017-10-16 NOTE — Progress Notes (Signed)
Called ot bedside for desaturation on CPAP. He was given ambien 10 mg for sleep. Became increasingly somnolent and hypoxic. Requiring BIPAP, high setting incldig high O2. >50%. CXR with increasing interstitial edema on the right side. Exam maybe with some crackles on both sides. Patient is otherwise stable and asleep. Arousable. BP and HR are wnl. Per wiofe he uses CPAP at home and had a recent sleep study. He was told he needs a new machine.  I think this patient flashed. This is likely a result of decompensated HF plus a supine position. Performed a bedside TTE. His LVEF appears to be grossly around 30%(very difficult conditions given his size), which would be lower than previously known (~45%).   Plan: - 80 mg iv lasix once - BIPAP. Wean as possible the setting.  - avoid sedatives going forward - TTE in am

## 2017-10-16 NOTE — ED Notes (Signed)
MD Fudim w/ cardiology at bedside.

## 2017-10-17 DIAGNOSIS — I5042 Chronic combined systolic (congestive) and diastolic (congestive) heart failure: Secondary | ICD-10-CM

## 2017-10-17 LAB — BASIC METABOLIC PANEL
Anion gap: 13 (ref 5–15)
BUN: 41 mg/dL — ABNORMAL HIGH (ref 6–20)
CALCIUM: 8.5 mg/dL — AB (ref 8.9–10.3)
CO2: 23 mmol/L (ref 22–32)
CREATININE: 3.78 mg/dL — AB (ref 0.61–1.24)
Chloride: 99 mmol/L — ABNORMAL LOW (ref 101–111)
GFR, EST AFRICAN AMERICAN: 19 mL/min — AB (ref >60)
GFR, EST NON AFRICAN AMERICAN: 16 mL/min — AB (ref >60)
Glucose, Bld: 156 mg/dL — ABNORMAL HIGH (ref 65–99)
Potassium: 3.6 mmol/L (ref 3.5–5.1)
SODIUM: 135 mmol/L (ref 135–145)

## 2017-10-17 LAB — GLUCOSE, CAPILLARY
GLUCOSE-CAPILLARY: 160 mg/dL — AB (ref 65–99)
GLUCOSE-CAPILLARY: 170 mg/dL — AB (ref 65–99)
Glucose-Capillary: 252 mg/dL — ABNORMAL HIGH (ref 65–99)
Glucose-Capillary: 115 mg/dL — ABNORMAL HIGH (ref 65–99)

## 2017-10-17 MED ORDER — FAMOTIDINE 20 MG PO TABS
20.0000 mg | ORAL_TABLET | Freq: Every day | ORAL | Status: DC
  Administered 2017-10-17: 20 mg via ORAL
  Filled 2017-10-17: qty 1

## 2017-10-17 MED ORDER — GUAIFENESIN 200 MG PO TABS
200.0000 mg | ORAL_TABLET | ORAL | Status: DC | PRN
  Administered 2017-10-17: 200 mg via ORAL
  Filled 2017-10-17 (×2): qty 1

## 2017-10-17 MED ORDER — FUROSEMIDE 10 MG/ML IJ SOLN
40.0000 mg | Freq: Two times a day (BID) | INTRAMUSCULAR | Status: DC
  Administered 2017-10-17 – 2017-10-18 (×2): 40 mg via INTRAVENOUS
  Filled 2017-10-17 (×2): qty 4

## 2017-10-17 NOTE — Progress Notes (Addendum)
Progress Note  Patient Name: Matthew TOLSON Sr. Date of Encounter: 10/17/2017  Primary Cardiologist: No primary care provider on file.   Subjective   He denies chest pain or difficulty breathing.    Inpatient Medications    Scheduled Meds: . allopurinol  100 mg Oral QHS  . amLODipine  10 mg Oral Daily  . aspirin  325 mg Oral Daily  . atorvastatin  80 mg Oral Daily  . carvedilol  50 mg Oral BID  . cholecalciferol  2,000 Units Oral BH-q7a  . clopidogrel  75 mg Oral Once  . famotidine  40 mg Oral QHS  . gabapentin  300 mg Oral TID  . hydrALAZINE  50 mg Oral TID  . insulin aspart  0-9 Units Subcutaneous TID WC  . insulin aspart protamine- aspart  20 Units Subcutaneous BID WC  . isosorbide mononitrate  120 mg Oral BID  . multivitamin  1 tablet Oral QHS  . omega-3 acid ethyl esters  2 g Oral BID  . pantoprazole  40 mg Oral Daily  . ranolazine  500 mg Oral BID   Continuous Infusions:  PRN Meds: acetaminophen, nitroGLYCERIN, ondansetron (ZOFRAN) IV, traMADol   Vital Signs    Vitals:   10/16/17 2359 10/17/17 0354 10/17/17 0402 10/17/17 0809  BP: 130/70 126/77  (!) 130/59  Pulse: 80 87  83  Resp: (!) 24 (!) 29    Temp: 98.2 F (36.8 C) 98.7 F (37.1 C)    TempSrc: Axillary Oral  Oral  SpO2: 99% 90% 96% 97%  Weight:  105.2 kg (232 lb)    Height:        Intake/Output Summary (Last 24 hours) at 10/17/2017 1020 Last data filed at 10/17/2017 0809 Gross per 24 hour  Intake 1070 ml  Output 2075 ml  Net -1005 ml   Filed Weights   10/15/17 1117 10/16/17 1210 10/17/17 0354  Weight: 108.9 kg (240 lb) 105 kg (231 lb 6.4 oz) 105.2 kg (232 lb)    Telemetry    NSR- Personally Reviewed  ECG    NSR, left axis deviation, premature atrial complex - Personally Reviewed  Physical Exam   GEN: No acute distress.   Neck: No JVD Cardiac: RRR, no murmurs, rubs, or gallops.  Respiratory: faint bibasilar crackles. GI: Soft, nontender, non-distended  MS: No edema; No  deformity. Neuro:  Nonfocal  Psych: Normal affect   Labs    Chemistry Recent Labs  Lab 10/15/17 1155 10/15/17 2033 10/16/17 0258 10/17/17 0348  NA 138  --  136 135  K 4.6  --  4.1 3.6  CL 106  --  104 99*  CO2 18*  --  20* 23  GLUCOSE 162*  --  128* 156*  BUN 41*  --  41* 41*  CREATININE 3.61*  --  3.55* 3.78*  CALCIUM 9.2  --  8.7* 8.5*  PROT  --  6.3*  --   --   ALBUMIN  --  3.5  --   --   AST  --  40  --   --   ALT  --  17  --   --   ALKPHOS  --  82  --   --   BILITOT  --  1.2  --   --   GFRNONAA 17*  --  18* 16*  GFRAA 20*  --  20* 19*  ANIONGAP 14  --  12 13     Hematology Recent Labs  Lab 10/15/17  1155 10/16/17 0258  WBC 10.0 14.4*  RBC 3.34* 3.50*  HGB 9.4* 9.8*  HCT 28.9* 30.3*  MCV 86.5 86.6  MCH 28.1 28.0  MCHC 32.5 32.3  RDW 16.3* 16.1*  PLT 211 231    Cardiac Enzymes Recent Labs  Lab 10/15/17 1257 10/15/17 2033 10/16/17 0057  TROPONINI 0.12* 0.11* 0.10*    Recent Labs  Lab 10/15/17 1201  TROPIPOC 0.13*     BNPNo results for input(s): BNP, PROBNP in the last 168 hours.   DDimer No results for input(s): DDIMER in the last 168 hours.   Radiology    Dg Chest 2 View  Result Date: 10/15/2017 CLINICAL DATA:  Onset last night constant chest pain and shortness of breath EXAM: CHEST  2 VIEW COMPARISON:  08/09/2017 FINDINGS: Sternotomy wires overlie normal cardiac silhouette. Normal pulmonary vasculature. No effusion, infiltrate, or pneumothorax. No acute osseous abnormality. No acute cardiopulmonary process. IMPRESSION: No active cardiopulmonary disease. Electronically Signed   By: Suzy Bouchard M.D.   On: 10/15/2017 12:23   Dg Chest Port 1 View  Result Date: 10/16/2017 CLINICAL DATA:  Acute onset of generalized chest pain and shortness of breath. EXAM: PORTABLE CHEST 1 VIEW COMPARISON:  Chest radiograph performed 10/15/2017 FINDINGS: New bilateral airspace opacification is noted, right greater than left, which may reflect asymmetric  pulmonary edema or pneumonia. A small right pleural effusion is suspected. No pneumothorax is seen. The cardiomediastinal silhouette is borderline enlarged. The patient is status post median sternotomy. There is a chronic fracture of the fourth sternal wire. No acute osseous abnormalities are identified. IMPRESSION: New bilateral airspace opacification, right greater than left, which may reflect asymmetric pulmonary edema or pneumonia. Suspect small right pleural effusion. Borderline cardiomegaly. Electronically Signed   By: Garald Balding M.D.   On: 10/16/2017 03:44    Cardiac Studies   Last cath was in 2015 with PCI  Last nuc in 11/2016 was abnormal with large fixed defect involving the basilar, mid and apical inferior wall with extension to the true cardiac apex consistent with region of prior infarct or scarring, dilated left ventricle with associated hypokinesis and near akinesis of the inferior wall, EF 35%, managed medically in light of advancing kidney disease. His most recent echo 12/2016 showed EF 45-50%, anterior, distal anteroseptal, apical and distal inferoapical akinesis suggestive of LAD territory ischemia/infarct, mild aortic stenosis, moderate LAE, mildly dilated RV with mildly reduced systolic function.  Patient Profile     57 y.o. male with history of (inferior MI '99 with PCI of RCA, prior extensive stenting of LAD, CABG with RIMA to RCA '06 related to anomalous RCA off origin of left, DES to mid-distal RCA 11/2013), chronic angina, CKD stage IV due to polycystic kidney disease, mild aortic stenosis, HTN, T2DM, GERD, OSA on CPAP, gout, hyperlipidemia, chronic combined CHF(EF 35% by nuc in 11/2016, 45-50% 12/2016), chronic anemia, morbid obesity who presents for evaluation of chest pain     Assessment & Plan    Unstable Angina Troponins were elevated on admission at 0.12 > 0.11 >0.10, however have remained flat.   Elevated troponin likely from HF and CKD.  No intervention at this  time.  Will continue with medical management. - asa, plavix - atorvastatin 80mg  - carvedilol 50mg  BID - ranexa 500mg   CKD stage IV  Creatinine currently 3.78, previous value from [facility] was >4.  Essential hypertension Stable, currently taking carvedilol 50mg  BID, Imdur 120mg , hydralazine 50 TID  Acute on chronic diastolic congestive heart failure Not on ACEI/ARB/spiro  given CKD.  Has diuresed well.  Out 3L since admission and weight down 8 lbs. Currently on HFNC 12 L and satting 100%, will need to titrate down as tolerable.  Will continue with IV lasix although  -IV lasix  DM Continuing home insulin with addition of SSI  I have examined the patient and reviewed assessment and plan and discussed with patient.  Agree with above as stated.  Plan for IV Lasix today.  Wean oxygen, switch to PO Lasix tomorrow with possible discharge.  I think his sx were more from acute on chronic diastolic heart failure/volume overload than ischemia.  Acute respiratory distress has resolved. Larae Grooms    For questions or updates, please contact [facility] Please consult [email or web address] for contact info under Cardiology/STEMI.      Signed, Boyd Kerbs, DO  10/17/2017, 10:20 AM

## 2017-10-17 NOTE — Progress Notes (Signed)
Pt currently on 4 LPM with sat of 94-95.

## 2017-10-17 NOTE — Progress Notes (Signed)
[facility] Progress Note    Assessment/ Plan:   1. NSTEMI: being medically managed for now, patient on heparin gtt. Patient and wife aware there is risk of CIN/requiring HD if cath needed, and they are understanding and willing to proceed if needed. Management per cardiology.  2.  CKD IV: secondary to PKD.  Cr is 3.78 from 3.55 yesterday, better than previous value gotten at [facility] of > 4.  BUN 41, stable. UOP overnight -2.L.  3.  Combined systolic and diastolic CHF-->  Lasix 40 mg daily has been ordered per cardiology.  4.  DM II: per primary  5.  Mild metabolic acidosis: if persistent on labs here as inpt need to start sodium bicarb PO.  Subjective:   No acute events overnight. No active chest pain.   Objective:   BP (!) 130/59 (BP Location: Right Arm)   Pulse 83   Temp 98.7 F (37.1 C) (Oral)   Resp (!) 29   Ht 5' 5.5" (1.664 m)   Wt 232 lb (105.2 kg)   SpO2 97%   BMI 38.02 kg/m   Intake/Output Summary (Last 24 hours) at 10/17/2017 1105 Last data filed at 10/17/2017 5784 Gross per 24 hour  Intake 1070 ml  Output 2075 ml  Net -1005 ml   Weight change: -9.6 oz (-3.901 kg)  Physical Exam:  Gen:NAD, comfortable CVS:RRR, no m/r/g Resp:CTA bil, no W/R/R ONG:EXBM, nt, nd Ext: no LE edema bil  Imaging: Dg Chest 2 View  Result Date: 10/15/2017 CLINICAL DATA:  Onset last night constant chest pain and shortness of breath EXAM: CHEST  2 VIEW COMPARISON:  08/09/2017 FINDINGS: Sternotomy wires overlie normal cardiac silhouette. Normal pulmonary vasculature. No effusion, infiltrate, or pneumothorax. No acute osseous abnormality. No acute cardiopulmonary process. IMPRESSION: No active cardiopulmonary disease. Electronically Signed   By: Suzy Bouchard M.D.   On: 10/15/2017 12:23   Dg Chest Port 1 View  Result Date: 10/16/2017 CLINICAL DATA:  Acute onset of generalized chest pain and shortness of breath. EXAM: PORTABLE CHEST 1 VIEW COMPARISON:  Chest radiograph  performed 10/15/2017 FINDINGS: New bilateral airspace opacification is noted, right greater than left, which may reflect asymmetric pulmonary edema or pneumonia. A small right pleural effusion is suspected. No pneumothorax is seen. The cardiomediastinal silhouette is borderline enlarged. The patient is status post median sternotomy. There is a chronic fracture of the fourth sternal wire. No acute osseous abnormalities are identified. IMPRESSION: New bilateral airspace opacification, right greater than left, which may reflect asymmetric pulmonary edema or pneumonia. Suspect small right pleural effusion. Borderline cardiomegaly. Electronically Signed   By: Garald Balding M.D.   On: 10/16/2017 03:44    Labs: BMET Recent Labs  Lab 10/15/17 1155 10/16/17 0258 10/17/17 0348  NA 138 136 135  K 4.6 4.1 3.6  CL 106 104 99*  CO2 18* 20* 23  GLUCOSE 162* 128* 156*  BUN 41* 41* 41*  CREATININE 3.61* 3.55* 3.78*  CALCIUM 9.2 8.7* 8.5*   CBC Recent Labs  Lab 10/15/17 1155 10/16/17 0258  WBC 10.0 14.4*  HGB 9.4* 9.8*  HCT 28.9* 30.3*  MCV 86.5 86.6  PLT 211 231    Medications:    . allopurinol  100 mg Oral QHS  . amLODipine  10 mg Oral Daily  . aspirin  325 mg Oral Daily  . atorvastatin  80 mg Oral Daily  . carvedilol  50 mg Oral BID  . cholecalciferol  2,000 Units Oral BH-q7a  . clopidogrel  75 mg Oral Once  . famotidine  20 mg Oral QHS  . gabapentin  300 mg Oral TID  . hydrALAZINE  50 mg Oral TID  . insulin aspart  0-9 Units Subcutaneous TID WC  . insulin aspart protamine- aspart  20 Units Subcutaneous BID WC  . isosorbide mononitrate  120 mg Oral BID  . multivitamin  1 tablet Oral QHS  . omega-3 acid ethyl esters  2 g Oral BID  . pantoprazole  40 mg Oral Daily  . ranolazine  500 mg Oral BID   Everrett Coombe, MD PGY2 10/17/2017, 11:05 AM

## 2017-10-18 DIAGNOSIS — E1121 Type 2 diabetes mellitus with diabetic nephropathy: Secondary | ICD-10-CM

## 2017-10-18 DIAGNOSIS — G4733 Obstructive sleep apnea (adult) (pediatric): Secondary | ICD-10-CM

## 2017-10-18 DIAGNOSIS — R079 Chest pain, unspecified: Secondary | ICD-10-CM

## 2017-10-18 DIAGNOSIS — Z9981 Dependence on supplemental oxygen: Secondary | ICD-10-CM

## 2017-10-18 DIAGNOSIS — I5033 Acute on chronic diastolic (congestive) heart failure: Secondary | ICD-10-CM

## 2017-10-18 DIAGNOSIS — Z9989 Dependence on other enabling machines and devices: Secondary | ICD-10-CM

## 2017-10-18 LAB — BASIC METABOLIC PANEL
Anion gap: 14 (ref 5–15)
BUN: 45 mg/dL — AB (ref 6–20)
CO2: 20 mmol/L — ABNORMAL LOW (ref 22–32)
CREATININE: 3.95 mg/dL — AB (ref 0.61–1.24)
Calcium: 8.6 mg/dL — ABNORMAL LOW (ref 8.9–10.3)
Chloride: 100 mmol/L — ABNORMAL LOW (ref 101–111)
GFR calc Af Amer: 18 mL/min — ABNORMAL LOW (ref >60)
GFR, EST NON AFRICAN AMERICAN: 16 mL/min — AB (ref >60)
GLUCOSE: 98 mg/dL (ref 65–99)
Potassium: 3.7 mmol/L (ref 3.5–5.1)
SODIUM: 134 mmol/L — AB (ref 135–145)

## 2017-10-18 LAB — GLUCOSE, CAPILLARY
GLUCOSE-CAPILLARY: 115 mg/dL — AB (ref 65–99)
Glucose-Capillary: 183 mg/dL — ABNORMAL HIGH (ref 65–99)

## 2017-10-18 MED ORDER — NITROGLYCERIN 0.4 MG SL SUBL: 0.4000 mg | SUBLINGUAL_TABLET | SUBLINGUAL | 2 refills | Status: DC | PRN

## 2017-10-18 MED ORDER — ASPIRIN EC 81 MG PO TBEC: 81.0000 mg | DELAYED_RELEASE_TABLET | Freq: Every day | ORAL | Status: DC

## 2017-10-18 MED ORDER — FUROSEMIDE 40 MG PO TABS: 40.0000 mg | ORAL_TABLET | Freq: Two times a day (BID) | ORAL | Status: DC

## 2017-10-18 MED ORDER — CLOPIDOGREL BISULFATE 75 MG PO TABS
75.0000 mg | ORAL_TABLET | Freq: Every day | ORAL | Status: DC
  Administered 2017-10-18: 75 mg via ORAL
  Filled 2017-10-18: qty 1

## 2017-10-18 MED ORDER — ASPIRIN 81 MG PO TBEC: 81.0000 mg | DELAYED_RELEASE_TABLET | Freq: Every day | ORAL | Status: DC

## 2017-10-18 NOTE — Progress Notes (Deleted)
Patient ID: Matthew Norville.,  MRN: [id number], DOB/AGE: [birth year] 57 y.o.  Admit date: 10/15/2017 Discharge date: 10/18/2017  Primary Care Provider: [name], MD Primary Cardiologist: Dr End  Discharge Diagnoses Principal Problem:   Chest pain with moderate risk of acute coronary syndrome Active Problems:   CAD- S/P multiple PCIs, CABG   Essential hypertension   Type 2 diabetes with nephropathy ([place])   Hypertensive heart disease   Hyperlipidemia associated with type 2 diabetes mellitus (HCC)   OSA on CPAP   Polycystic kidney disease   Ischemic cardiomyopathy   Hx of CABG   Chronic kidney disease, stage IV (severe) (HCC)   Chronic anemia   Acute on chronic combined systolic and diastolic CHF (congestive heart failure) (HCC)   Elevated troponin    Procedures: Echo 10/16/17   Hospital Course:  57 y.o. male with history of extensive CAD- (inferior MI '99 with PCI of RCA, prior extensive stenting of LAD, CABG x 1 in '06 with an RIMA to RCA, and a DES to his mid-distal RCA in 11/2013). The pt has chronic angina, CKD stage IV due to polycystic kidney disease, mild aortic stenosis, HTN, T2DM, GERD, OSA on CPAP, gout, hyperlipidemia, chronic combined CHF(EF 35% by nuc in 11/2016, 45-50% 12/2016), chronic anemia, and morbid obesity. We werre asked to admit for chest pain 10/15/17.  The pt was seen in consult by [facility]. At this time no indication to start HD. The pt has seen Dr Justin Mend in the past and discussed the possibility of peritoneal dialysis but with a SCr of 3.5-3.9 he's not there yet. The pt had elevated Troponin levels with a flat trend felt to be secondary to acute on chronic combined CHF. He was diuresed 3.5L, 7 lbs, with improvement in his symptoms. The plan is for continued medical Rx. Echo showed an EF of 55-60% with moderate LVH. Dr Irish Lack feels he can be discharged 10/18/17 and f/u with Dr End next week. He has been referred to his PCP for DM follow up.     Discharge Vitals:  Blood pressure 124/66, pulse 86, temperature 98.2 F (36.8 C), temperature source Oral, resp. rate 20, height 5' 5.5" (1.664 m), weight 233 lb 14.5 oz (106.1 kg), SpO2 91 %.    Labs: Results for orders placed or performed during the hospital encounter of 10/15/17 (from the past 24 hour(s))  Glucose, capillary     Status: Abnormal   Collection Time: 10/17/17  4:34 PM  Result Value Ref Range   Glucose-Capillary 252 (H) 65 - 99 mg/dL  Glucose, capillary     Status: Abnormal   Collection Time: 10/17/17  8:59 PM  Result Value Ref Range   Glucose-Capillary 115 (H) 65 - 99 mg/dL  Basic metabolic panel     Status: Abnormal   Collection Time: 10/18/17  4:17 AM  Result Value Ref Range   Sodium 134 (L) 135 - 145 mmol/L   Potassium 3.7 3.5 - 5.1 mmol/L   Chloride 100 (L) 101 - 111 mmol/L   CO2 20 (L) 22 - 32 mmol/L   Glucose, Bld 98 65 - 99 mg/dL   BUN 45 (H) 6 - 20 mg/dL   Creatinine, Ser 3.95 (H) 0.61 - 1.24 mg/dL   Calcium 8.6 (L) 8.9 - 10.3 mg/dL   GFR calc non Af Amer 16 (L) >60 mL/min   GFR calc Af Amer 18 (L) >60 mL/min   Anion gap 14 5 - 15  Glucose, capillary  Status: Abnormal   Collection Time: 10/18/17  7:37 AM  Result Value Ref Range   Glucose-Capillary 115 (H) 65 - 99 mg/dL  Glucose, capillary     Status: Abnormal   Collection Time: 10/18/17 11:52 AM  Result Value Ref Range   Glucose-Capillary 183 (H) 65 - 99 mg/dL   Comment 1 QC Due     Disposition:  Follow-up Information    End, Harrell Gave, MD Follow up on 10/24/2017.   Specialty:  Cardiology Why:  10:20 am Contact information: [address] [place] [address] [phone]        [name], MD Follow up.   Specialty:  Family Medicine Why:  call office for follow up Contact information: [address] [place] [address] [phone]           Discharge Medications:  Allergies as of 10/18/2017      Reactions   Ciprocin-fluocin-procin  [fluocinolone Acetonide] Rash   Clarithromycin Itching, Other (See Comments)   "Biaxin" Eyes itch and burn   Cleocin [clindamycin Hcl] Anaphylaxis, Swelling   Glimepiride [amaryl] Other (See Comments)   Elevates liver function   Colchicine Other (See Comments)   Affected kidneys Affected kidneys Affected kidneys      Medication List    STOP taking these medications   HYDROcodone-acetaminophen 5-325 MG tablet Commonly known as:  NORCO/VICODIN   linagliptin 5 MG Tabs tablet Commonly known as:  TRADJENTA     TAKE these medications   acetaminophen 500 MG tablet Commonly known as:  TYLENOL Take 500 mg by mouth 3 (three) times daily as needed (for pain or headaches).   allopurinol 100 MG tablet Commonly known as:  ZYLOPRIM Take 100 mg by mouth every morning.   amLODipine 10 MG tablet Commonly known as:  NORVASC Take 1 tablet (10 mg total) by mouth daily.   aspirin 81 MG EC tablet Take 1 tablet (81 mg total) by mouth daily. Start taking on:  10/19/2017 What changed:    medication strength  how much to take   atorvastatin 80 MG tablet Commonly known as:  LIPITOR Take 1 tablet (80 mg total) by mouth daily.   carvedilol 25 MG tablet Commonly known as:  COREG Take 2 tablets (50 mg total) by mouth 2 (two) times daily.   Cholecalciferol 2000 units Tabs Take 2,000 Units by mouth every morning.   clopidogrel 75 MG tablet Commonly known as:  PLAVIX TAKE ONE TABLET BY MOUTH ONCE DAILY WITH BREAKFAST What changed:  See the new instructions.   diphenhydrAMINE 25 MG tablet Commonly known as:  BENADRYL Take 25 mg by mouth every 6 (six) hours as needed for allergies or sleep.   Fish Oil 1000 MG Caps Take 2,000 mg by mouth 2 (two) times daily.   furosemide 40 MG tablet Commonly known as:  LASIX Take 1 tablet (40 mg total) by mouth 2 (two) times daily.   gabapentin 300 MG capsule Commonly known as:  NEURONTIN Take 1 capsule (300 mg total) by mouth 2 (two) times  daily. What changed:  when to take this   hydrALAZINE 50 MG tablet Commonly known as:  APRESOLINE Take 1 tablet (50 mg total) by mouth 3 (three) times daily.   isosorbide mononitrate 120 MG 24 hr tablet Commonly known as:  IMDUR Take 1 tablet (120 mg total) by mouth 2 (two) times daily. What changed:  Another medication with the same name was removed. Continue taking this medication, and follow the  directions you see here.   Misc. Devices Misc by Does not apply route. C-PAP   multivitamin with minerals Tabs tablet Take 1 tablet by mouth daily.   nitroGLYCERIN 0.4 MG SL tablet Commonly known as:  NITROSTAT Place 1 tablet (0.4 mg total) under the tongue every 5 (five) minutes as needed for chest pain.   NOVOLIN 70/30  Inject 20 Units into the skin 2 (two) times daily.   pantoprazole 40 MG tablet Commonly known as:  PROTONIX Take 40 mg by mouth daily.   ranitidine 300 MG tablet Commonly known as:  ZANTAC Take 300 mg by mouth at bedtime.   ranolazine 500 MG 12 hr tablet Commonly known as:  RANEXA Take 1 tablet (500 mg total) by mouth 2 (two) times daily.   traMADol 50 MG tablet Commonly known as:  ULTRAM Take 1 tablet (50 mg total) by mouth every 6 (six) hours as needed.   vitamin E 400 UNIT capsule Take 400 Units by mouth every evening.   zolpidem 10 MG tablet Commonly known as:  AMBIEN Take 10 mg by mouth at bedtime as needed.        Duration of Discharge Encounter: Greater than 30 minutes including physician time.  Angelena Form PA-C 10/18/2017 1:47 PM

## 2017-10-18 NOTE — Discharge Summary (Signed)
Patient ID: Matthew Kelemen.,  MRN: [id number], DOB/AGE: 1961/08/29 56 y.o.  Admit date: 10/15/2017 Discharge date: 10/18/2017  Primary Care Provider: [name], MD Primary Cardiologist: Dr End  Discharge Diagnoses Principal Problem:   Chest pain with moderate risk of acute coronary syndrome Active Problems:   CAD- S/P multiple PCIs, CABG   Essential hypertension   Type 2 diabetes with nephropathy ([place])   Hypertensive heart disease   Hyperlipidemia associated with type 2 diabetes mellitus (HCC)   OSA on CPAP   Polycystic kidney disease   Ischemic cardiomyopathy   Hx of CABG   Chronic kidney disease, stage IV (severe) (HCC)   Chronic anemia   Acute on chronic combined systolic and diastolic CHF (congestive heart failure) (HCC)   Elevated troponin    Procedures: Echo 10/16/17   Hospital Course:  58 y.o.malewith history of extensive CAD- (inferior MI '99 with PCI of RCA, prior extensive stenting of LAD, CABG x 1 in '06 with an RIMA to RCA, and a DES to his mid-distal RCA in 11/2013). The pt has chronic angina, CKD stage IV due to polycystic kidney disease, mild aortic stenosis, HTN, T2DM, GERD, OSA on CPAP, gout, hyperlipidemia, chronic combined CHF(EF 35% by nuc in 11/2016, 45-50% 12/2016),chronic anemia,and morbid obesity. We werre asked to admit for chest pain 10/15/17.  The pt was seen in consult by [facility]. At this time no indication to start HD. The pt has seen Dr Justin Mend in the past and discussed the possibility of peritoneal dialysis but with a SCr of 3.5-3.9 he's not there yet. The pt had elevated Troponin levels with a flat trend felt to be secondary to acute on chronic combined CHF. He was diuresed 3.5L, 7 lbs, with improvement in his symptoms. The plan is for continued medical Rx. Echo showed an EF of 55-60% with moderate LVH. Dr Irish Lack feels he can be discharged 10/18/17 and f/u with Dr End next week. He has been referred to his PCP for DM follow  up. The pt was sent home on O2 2L.    Discharge Vitals:  Blood pressure 124/66, pulse 86, temperature 98.2 F (36.8 C), temperature source Oral, resp. rate 20, height 5' 5.5" (1.664 m), weight 233 lb 14.5 oz (106.1 kg), SpO2 91 %.    Labs: LabResultsLast24Hours       Results for orders placed or performed during the hospital encounter of 10/15/17 (from the past 24 hour(s))  Glucose, capillary     Status: Abnormal   Collection Time: 10/17/17  4:34 PM  Result Value Ref Range   Glucose-Capillary 252 (H) 65 - 99 mg/dL  Glucose, capillary     Status: Abnormal   Collection Time: 10/17/17  8:59 PM  Result Value Ref Range   Glucose-Capillary 115 (H) 65 - 99 mg/dL  Basic metabolic panel     Status: Abnormal   Collection Time: 10/18/17  4:17 AM  Result Value Ref Range   Sodium 134 (L) 135 - 145 mmol/L   Potassium 3.7 3.5 - 5.1 mmol/L   Chloride 100 (L) 101 - 111 mmol/L   CO2 20 (L) 22 - 32 mmol/L   Glucose, Bld 98 65 - 99 mg/dL   BUN 45 (H) 6 - 20 mg/dL   Creatinine, Ser 3.95 (H) 0.61 - 1.24 mg/dL   Calcium 8.6 (L) 8.9 - 10.3 mg/dL   GFR calc non Af Amer 16 (L) >60 mL/min   GFR calc Af Amer 18 (L) >60 mL/min  Anion gap 14 5 - 15  Glucose, capillary     Status: Abnormal   Collection Time: 10/18/17  7:37 AM  Result Value Ref Range   Glucose-Capillary 115 (H) 65 - 99 mg/dL  Glucose, capillary     Status: Abnormal   Collection Time: 10/18/17 11:52 AM  Result Value Ref Range   Glucose-Capillary 183 (H) 65 - 99 mg/dL   Comment 1 QC Due       Disposition:     Follow-up Information    End, Christopher, MD Follow up on 10/24/2017.   Specialty:  Cardiology Why:  10:20 am Contact information: [address] [place] [address] [phone]        [name], MD Follow up.   Specialty:  Family Medicine Why:  call office for follow up Contact information: [address] [place]  [address] [phone]           Discharge Medications:      Allergies as of 10/18/2017      Reactions   Ciprocin-fluocin-procin [fluocinolone Acetonide] Rash   Clarithromycin Itching, Other (See Comments)   "Biaxin" Eyes itch and burn   Cleocin [clindamycin Hcl] Anaphylaxis, Swelling   Glimepiride [amaryl] Other (See Comments)   Elevates liver function   Colchicine Other (See Comments)   Affected kidneys Affected kidneys Affected kidneys              Medication List     STOP taking these medications   HYDROcodone-acetaminophen 5-325 MG tablet Commonly known as:  NORCO/VICODIN   linagliptin 5 MG Tabs tablet Commonly known as:  TRADJENTA     TAKE these medications   acetaminophen 500 MG tablet Commonly known as:  TYLENOL Take 500 mg by mouth 3 (three) times daily as needed (for pain or headaches).   allopurinol 100 MG tablet Commonly known as:  ZYLOPRIM Take 100 mg by mouth every morning.   amLODipine 10 MG tablet Commonly known as:  NORVASC Take 1 tablet (10 mg total) by mouth daily.   aspirin 81 MG EC tablet Take 1 tablet (81 mg total) by mouth daily. Start taking on:  10/19/2017 What changed:    medication strength  how much to take   atorvastatin 80 MG tablet Commonly known as:  LIPITOR Take 1 tablet (80 mg total) by mouth daily.   carvedilol 25 MG tablet Commonly known as:  COREG Take 2 tablets (50 mg total) by mouth 2 (two) times daily.   Cholecalciferol 2000 units Tabs Take 2,000 Units by mouth every morning.   clopidogrel 75 MG tablet Commonly known as:  PLAVIX TAKE ONE TABLET BY MOUTH ONCE DAILY WITH BREAKFAST What changed:  See the new instructions.   diphenhydrAMINE 25 MG tablet Commonly known as:  BENADRYL Take 25 mg by mouth every 6 (six) hours as needed for allergies or sleep.   Fish Oil 1000 MG Caps Take 2,000 mg by mouth 2 (two) times daily.   furosemide 40 MG tablet Commonly known as:   LASIX Take 1 tablet (40 mg total) by mouth 2 (two) times daily.   gabapentin 300 MG capsule Commonly known as:  NEURONTIN Take 1 capsule (300 mg total) by mouth 2 (two) times daily. What changed:  when to take this   hydrALAZINE 50 MG tablet Commonly known as:  APRESOLINE Take 1 tablet (50 mg total) by mouth 3 (three) times daily.   isosorbide mononitrate 120 MG 24 hr tablet Commonly known as:  IMDUR Take 1 tablet (120 mg total) by mouth 2 (two) times daily. What changed:  Another medication with the same name was removed. Continue taking this medication, and follow the directions you see here.   Misc. Devices Misc by Does not apply route. C-PAP   multivitamin with minerals Tabs tablet Take 1 tablet by mouth daily.   nitroGLYCERIN 0.4 MG SL tablet Commonly known as:  NITROSTAT Place 1 tablet (0.4 mg total) under the tongue every 5 (five) minutes as needed for chest pain.   NOVOLIN 70/30 [place] Inject 20 Units into the skin 2 (two) times daily.   pantoprazole 40 MG tablet Commonly known as:  PROTONIX Take 40 mg by mouth daily.   ranitidine 300 MG tablet Commonly known as:  ZANTAC Take 300 mg by mouth at bedtime.   ranolazine 500 MG 12 hr tablet Commonly known as:  RANEXA Take 1 tablet (500 mg total) by mouth 2 (two) times daily.   traMADol 50 MG tablet Commonly known as:  ULTRAM Take 1 tablet (50 mg total) by mouth every 6 (six) hours as needed.   vitamin E 400 UNIT capsule Take 400 Units by mouth every evening.   zolpidem 10 MG tablet Commonly known as:  AMBIEN Take 10 mg by mouth at bedtime as needed.        Duration of Discharge Encounter: Greater than 30 minutes including physician time.  Signed, Kerin Ransom PA-C 10/18/2017 1:47 PM  I have examined the patient and reviewed assessment and plan and discussed with patient.  Agree with above as stated.  Well diuresed at this point.  Send home on Lasix 40 mg BID.  Needs renal f/u. Needs home  oxygen.  Order placed for home oxygen.    No further anginal sx.  No ischemia w/u planned given stage IV CKD.  Plan discharge today.  Larae Grooms

## 2017-10-18 NOTE — Discharge Summary (Deleted)
Patient ID: Matthew Aber.,  MRN: [id number], DOB/AGE: 1961-03-28 57 y.o.  Admit date: 10/15/2017 Discharge date: 10/18/2017  Primary Care Provider: [name], MD Primary Cardiologist: Dr End  Discharge Diagnoses Principal Problem:   Chest pain with moderate risk of acute coronary syndrome Active Problems:   CAD- S/P multiple PCIs, CABG   Essential hypertension   Type 2 diabetes with nephropathy ([place])   Hypertensive heart disease   Hyperlipidemia associated with type 2 diabetes mellitus (HCC)   OSA on CPAP   Polycystic kidney disease   Ischemic cardiomyopathy   Hx of CABG   Chronic kidney disease, stage IV (severe) (HCC)   Chronic anemia   Acute on chronic combined systolic and diastolic CHF (congestive heart failure) (HCC)   Elevated troponin    Procedures: Echo 10/16/17   Hospital Course:  57 y.o.malewith history of extensive CAD- (inferior MI '99 with PCI of RCA, prior extensive stenting of LAD, CABG x 1 in '06 with an RIMA to RCA, and a DES to his mid-distal RCA in 11/2013). The pt has chronic angina, CKD stage IV due to polycystic kidney disease, mild aortic stenosis, HTN, T2DM, GERD, OSA on CPAP, gout, hyperlipidemia, chronic combined CHF(EF 35% by nuc in 11/2016, 45-50% 12/2016),chronic anemia,and morbid obesity. We werre asked to admit for chest pain 10/15/17.  The pt was seen in consult by [facility]. At this time no indication to start HD. The pt has seen Dr Justin Mend in the past and discussed the possibility of peritoneal dialysis but with a SCr of 3.5-3.9 he's not there yet. The pt had elevated Troponin levels with a flat trend felt to be secondary to acute on chronic combined CHF. He was diuresed 3.5L, 7 lbs, with improvement in his symptoms. The plan is for continued medical Rx. Echo showed an EF of 55-60% with moderate LVH. Dr Irish Lack feels he can be discharged 10/18/17 and f/u with Dr End next week. He has been referred to his PCP for DM follow  up.    Discharge Vitals:  Blood pressure 124/66, pulse 86, temperature 98.2 F (36.8 C), temperature source Oral, resp. rate 20, height 5' 5.5" (1.664 m), weight 233 lb 14.5 oz (106.1 kg), SpO2 91 %.    Labs: LabResultsLast24Hours       Results for orders placed or performed during the hospital encounter of 10/15/17 (from the past 24 hour(s))  Glucose, capillary     Status: Abnormal   Collection Time: 10/17/17  4:34 PM  Result Value Ref Range   Glucose-Capillary 252 (H) 65 - 99 mg/dL  Glucose, capillary     Status: Abnormal   Collection Time: 10/17/17  8:59 PM  Result Value Ref Range   Glucose-Capillary 115 (H) 65 - 99 mg/dL  Basic metabolic panel     Status: Abnormal   Collection Time: 10/18/17  4:17 AM  Result Value Ref Range   Sodium 134 (L) 135 - 145 mmol/L   Potassium 3.7 3.5 - 5.1 mmol/L   Chloride 100 (L) 101 - 111 mmol/L   CO2 20 (L) 22 - 32 mmol/L   Glucose, Bld 98 65 - 99 mg/dL   BUN 45 (H) 6 - 20 mg/dL   Creatinine, Ser 3.95 (H) 0.61 - 1.24 mg/dL   Calcium 8.6 (L) 8.9 - 10.3 mg/dL   GFR calc non Af Amer 16 (L) >60 mL/min   GFR calc Af Amer 18 (L) >60 mL/min   Anion gap 14 5 - 15  Glucose, capillary     Status: Abnormal   Collection Time: 10/18/17  7:37 AM  Result Value Ref Range   Glucose-Capillary 115 (H) 65 - 99 mg/dL  Glucose, capillary     Status: Abnormal   Collection Time: 10/18/17 11:52 AM  Result Value Ref Range   Glucose-Capillary 183 (H) 65 - 99 mg/dL   Comment 1 QC Due       Disposition:     Follow-up Information    End, Harrell Gave, MD Follow up on 10/24/2017.   Specialty:  Cardiology Why:  10:20 am Contact information: [address] [place] [address] [phone]        [name], MD Follow up.   Specialty:  Family Medicine Why:  call office for follow up Contact information: [place] [place] [address] [phone]           Discharge  Medications:      Allergies as of 10/18/2017      Reactions   Ciprocin-fluocin-procin [fluocinolone Acetonide] Rash   Clarithromycin Itching, Other (See Comments)   "Biaxin" Eyes itch and burn   Cleocin [clindamycin Hcl] Anaphylaxis, Swelling   Glimepiride [amaryl] Other (See Comments)   Elevates liver function   Colchicine Other (See Comments)   Affected kidneys Affected kidneys Affected kidneys              Medication List     STOP taking these medications   HYDROcodone-acetaminophen 5-325 MG tablet Commonly known as:  NORCO/VICODIN   linagliptin 5 MG Tabs tablet Commonly known as:  TRADJENTA     TAKE these medications   acetaminophen 500 MG tablet Commonly known as:  TYLENOL Take 500 mg by mouth 3 (three) times daily as needed (for pain or headaches).   allopurinol 100 MG tablet Commonly known as:  ZYLOPRIM Take 100 mg by mouth every morning.   amLODipine 10 MG tablet Commonly known as:  NORVASC Take 1 tablet (10 mg total) by mouth daily.   aspirin 81 MG EC tablet Take 1 tablet (81 mg total) by mouth daily. Start taking on:  10/19/2017 What changed:    medication strength  how much to take   atorvastatin 80 MG tablet Commonly known as:  LIPITOR Take 1 tablet (80 mg total) by mouth daily.   carvedilol 25 MG tablet Commonly known as:  COREG Take 2 tablets (50 mg total) by mouth 2 (two) times daily.   Cholecalciferol 2000 units Tabs Take 2,000 Units by mouth every morning.   clopidogrel 75 MG tablet Commonly known as:  PLAVIX TAKE ONE TABLET BY MOUTH ONCE DAILY WITH BREAKFAST What changed:  See the new instructions.   diphenhydrAMINE 25 MG tablet Commonly known as:  BENADRYL Take 25 mg by mouth every 6 (six) hours as needed for allergies or sleep.   Fish Oil 1000 MG Caps Take 2,000 mg by mouth 2 (two) times daily.   furosemide 40 MG tablet Commonly known as:  LASIX Take 1 tablet (40 mg total) by mouth 2 (two) times  daily.   gabapentin 300 MG capsule Commonly known as:  NEURONTIN Take 1 capsule (300 mg total) by mouth 2 (two) times daily. What changed:  when to take this   hydrALAZINE 50 MG tablet Commonly known as:  APRESOLINE Take 1 tablet (50 mg total) by mouth 3 (three) times daily.   isosorbide mononitrate 120 MG 24 hr tablet Commonly known as:  IMDUR Take 1 tablet (120 mg total)  by mouth 2 (two) times daily. What changed:  Another medication with the same name was removed. Continue taking this medication, and follow the directions you see here.   Misc. Devices Misc by Does not apply route. C-PAP   multivitamin with minerals Tabs tablet Take 1 tablet by mouth daily.   nitroGLYCERIN 0.4 MG SL tablet Commonly known as:  NITROSTAT Place 1 tablet (0.4 mg total) under the tongue every 5 (five) minutes as needed for chest pain.   NOVOLIN 70/30 Ingenio Inject 20 Units into the skin 2 (two) times daily.   pantoprazole 40 MG tablet Commonly known as:  PROTONIX Take 40 mg by mouth daily.   ranitidine 300 MG tablet Commonly known as:  ZANTAC Take 300 mg by mouth at bedtime.   ranolazine 500 MG 12 hr tablet Commonly known as:  RANEXA Take 1 tablet (500 mg total) by mouth 2 (two) times daily.   traMADol 50 MG tablet Commonly known as:  ULTRAM Take 1 tablet (50 mg total) by mouth every 6 (six) hours as needed.   vitamin E 400 UNIT capsule Take 400 Units by mouth every evening.   zolpidem 10 MG tablet Commonly known as:  AMBIEN Take 10 mg by mouth at bedtime as needed.        Duration of Discharge Encounter: Greater than 30 minutes including physician time.  Angelena Form PA-C 10/18/2017 1:47 PM

## 2017-10-18 NOTE — Progress Notes (Addendum)
SATURATION QUALIFICATIONS: (This note is used to comply with regulatory documentation for home oxygen)  Patient Saturations on Room Air at Rest = 87%  Patient Saturations on Room Air while Ambulating = 79%  Patient Saturations on 2 Liters of oxygen while Ambulating = 94-99%  Please briefly explain why patient needs home oxygen:

## 2017-10-18 NOTE — Care Management Note (Addendum)
Case Management Note  Patient Details  Name: Matthew Rowe. MRN: [id number] Date of Birth: 1961-06-14  Subjective/Objective: Pt presented for CHF Exacerbation. PTA from home with wife. Plan for home 10-18-17.                    Action/Plan: Pt in need for Oxygen. DME referral sent to [facility]. DME to be delivered to room prior to d/c. No further needs from CM at this time.   Expected Discharge Date:  10/18/17               Expected Discharge Plan:  Home/Self Care  In-House Referral:     Discharge planning Services  CM Consult  Post Acute Care Choice:  Durable Medical Equipment Choice offered to:  Patient, Spouse  DME Arranged:  Oxygen DME Agency:  NA  HH Arranged:  NA HH Agency:  NA  Status of Service:  Completed, signed off  If discussed at [place] of Stay Meetings, dates discussed:    Additional Comments: Late Entry. 1008 10-23-17 Jacqlyn Krauss, RN, BSN [phone] CM received call from wife in regards to service with [facility]. Per pt the person that delivered the concentrator from [facility] did not explain how to utilize the machine and that the machine was an older model. CM did relay this info to Liaison Dan to report back to Freight forwarder. CM did call [facility] to start a new referral. 02 to be delivered today from [facility] and wife will then cancel services with [facility]. No further needs from CM at this time.                    Bethena Roys, RN 10/18/2017, 2:55 PM

## 2017-10-18 NOTE — Progress Notes (Signed)
Progress Note  Patient Name: Matthew MATSUMURA Sr. Date of Encounter: 10/18/2017  Primary Cardiologist: Nelva Bush, MD   Subjective   He denies chest pain or difficulty breathing.  Sitting up on the phone paying his bills.  Inpatient Medications    Scheduled Meds: . allopurinol  100 mg Oral QHS  . amLODipine  10 mg Oral Daily  . aspirin  325 mg Oral Daily  . atorvastatin  80 mg Oral Daily  . carvedilol  50 mg Oral BID  . cholecalciferol  2,000 Units Oral [id number]  . clopidogrel  75 mg Oral Once  . famotidine  20 mg Oral QHS  . gabapentin  300 mg Oral TID  . hydrALAZINE  50 mg Oral TID  . insulin aspart  0-9 Units Subcutaneous TID WC  . insulin aspart protamine- aspart  20 Units Subcutaneous BID WC  . isosorbide mononitrate  120 mg Oral BID  . multivitamin  1 tablet Oral QHS  . omega-3 acid ethyl esters  2 g Oral BID  . pantoprazole  40 mg Oral Daily  . ranolazine  500 mg Oral BID   Continuous Infusions:  PRN Meds: acetaminophen, guaiFENesin, nitroGLYCERIN, ondansetron (ZOFRAN) IV, traMADol   Vital Signs    Vitals:   10/18/17 0355 10/18/17 0449 10/18/17 0741 10/18/17 0854  BP:  (!) 99/57 128/65 124/66  Pulse: 82 87 86   Resp: (!) 26 20    Temp:  100.3 F (37.9 C) 98.2 F (36.8 C)   TempSrc:  Oral Oral   SpO2: 97% 94% (!) 88%   Weight:  106.1 kg (233 lb 14.5 oz)    Height:        Intake/Output Summary (Last 24 hours) at 10/18/2017 0925 Last data filed at 10/18/2017 3536 Gross per 24 hour  Intake 840 ml  Output 1000 ml  Net -160 ml   Filed Weights   10/16/17 1210 10/17/17 0354 10/18/17 0449  Weight: 105 kg (231 lb 6.4 oz) 105.2 kg (232 lb) 106.1 kg (233 lb 14.5 oz)    Telemetry    NSR- Personally Reviewed  ECG    NSR, left axis deviation, premature atrial complex - Personally Reviewed  Physical Exam   GEN: No acute distress.   Neck: No JVD Cardiac: RRR, no murmurs, rubs, or gallops.  Respiratory: faint bibasilar crackles. GI: Soft,  nontender, non-distended  MS: No edema; No deformity. Neuro:  Nonfocal  Psych: Normal affect   Labs    Chemistry Recent Labs  Lab 10/15/17 2033 10/16/17 0258 10/17/17 0348 10/18/17 0417  NA  --  136 135 134*  K  --  4.1 3.6 3.7  CL  --  104 99* 100*  CO2  --  20* 23 20*  GLUCOSE  --  128* 156* 98  BUN  --  41* 41* 45*  CREATININE  --  3.55* 3.78* 3.95*  CALCIUM  --  8.7* 8.5* 8.6*  PROT 6.3*  --   --   --   ALBUMIN 3.5  --   --   --   AST 40  --   --   --   ALT 17  --   --   --   ALKPHOS 82  --   --   --   BILITOT 1.2  --   --   --   GFRNONAA  --  18* 16* 16*  GFRAA  --  20* 19* 18*  ANIONGAP  --  12 13 14  Hematology Recent Labs  Lab 10/15/17 1155 10/16/17 0258  WBC 10.0 14.4*  RBC 3.34* 3.50*  HGB 9.4* 9.8*  HCT 28.9* 30.3*  MCV 86.5 86.6  MCH 28.1 28.0  MCHC 32.5 32.3  RDW 16.3* 16.1*  PLT 211 231    Cardiac Enzymes Recent Labs  Lab 10/15/17 1257 10/15/17 2033 10/16/17 0057  TROPONINI 0.12* 0.11* 0.10*    Recent Labs  Lab 10/15/17 1201  TROPIPOC 0.13*     BNPNo results for input(s): BNP, PROBNP in the last 168 hours.   DDimer No results for input(s): DDIMER in the last 168 hours.   Radiology    No results found.  Cardiac Studies   Last cath was in 2015 with PCI  Last nuc in 11/2016 was abnormal with large fixed defect involving the basilar, mid and apical inferior wall with extension to the true cardiac apex consistent with region of prior infarct or scarring, dilated left ventricle with associated hypokinesis and near akinesis of the inferior wall, EF 35%, managed medically in light of advancing kidney disease. His most recent echo 12/2016 showed EF 45-50%, anterior, distal anteroseptal, apical and distal inferoapical akinesis suggestive of LAD territory ischemia/infarct, mild aortic stenosis, moderate LAE, mildly dilated RV with mildly reduced systolic function.  Patient Profile     57 y.o. 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   Elevated troponin likely from HF and CKD.  No intervention at this time.  Will continue with medical management. - asa, plavix - atorvastatin 80mg  - carvedilol 50mg  BID - ranexa 500mg   CKD stage IV  Creatinine currently 3.95, previous value from [facility] was >4.  Will discontinue IV lasix and transition to home dose lasix.    Essential hypertension Stable, currently taking carvedilol 50mg  BID, Imdur 120mg , hydralazine 50 TID  Acute on chronic diastolic congestive heart failure Not on ACEI/ARB/spiro given CKD.  Has diuresed well.  Out 3L since admission and weight down 8 lbs. Currently on room air satting 96%.  Likely DC today. -transition to home lasix  DM Continuing home insulin with addition of SSI   For questions or updates, please contact [facility] Please consult [email or web address] for contact info under Cardiology/STEMI.      Signed, Boyd Kerbs, DO  10/18/2017, 9:25 AM    I have examined the patient and reviewed assessment and plan and discussed with patient.  Agree with above as stated.  Well diuresed at this point.  Send home on Lasix 40 mg BID.  Needs renal f/u.  May need home oxygen.  Order placed to check for need for home oxygen.    No further anginal sx.  No ischemia w/u planned given stage IV CKD.  Plan discharge today.  Larae Grooms

## 2017-10-18 NOTE — Discharge Instructions (Signed)
Chest Wall Pain °Chest wall pain is pain in or around the bones and muscles of your chest. Sometimes, an injury causes this pain. Sometimes, the cause may not be known. This pain may take several weeks or longer to get better. °Follow these instructions at home: °Pay attention to any changes in your symptoms. Take these actions to help with your pain: °· Rest as told by your doctor. °· Avoid activities that cause pain. Try not to use your chest, belly (abdominal), or side muscles to lift heavy things. °· If directed, apply ice to the painful area: °? Put ice in a plastic bag. °? Place a towel between your skin and the bag. °? Leave the ice on for 20 minutes, 2-3 times per day. °· Take over-the-counter and prescription medicines only as told by your doctor. °· Do not use tobacco products, including cigarettes, chewing tobacco, and e-cigarettes. If you need help quitting, ask your doctor. °· Keep all follow-up visits as told by your doctor. This is important. ° °Contact a doctor if: °· You have a fever. °· Your chest pain gets worse. °· You have new symptoms. °Get help right away if: °· You feel sick to your stomach (nauseous) or you throw up (vomit). °· You feel sweaty or light-headed. °· You have a cough with phlegm (sputum) or you cough up blood. °· You are short of breath. °This information is not intended to replace advice given to you by your health care provider. Make sure you discuss any questions you have with your health care provider. °Document Released: 02/13/2008 Document Revised: 02/02/2016 Document Reviewed: 11/22/2014 °Elsevier Interactive Patient Education © 2018 [employer] ° °

## 2017-10-24 ENCOUNTER — Ambulatory Visit (INDEPENDENT_AMBULATORY_CARE_PROVIDER_SITE_OTHER): Payer: Medicare HMO | Admitting: Internal Medicine

## 2017-10-24 ENCOUNTER — Encounter: Payer: Self-pay | Admitting: Internal Medicine

## 2017-10-24 VITALS — BP 146/70 | HR 75 | Ht 65.5 in | Wt 231.0 lb

## 2017-10-24 DIAGNOSIS — I10 Essential (primary) hypertension: Secondary | ICD-10-CM | POA: Diagnosis not present

## 2017-10-24 DIAGNOSIS — E782 Mixed hyperlipidemia: Secondary | ICD-10-CM

## 2017-10-24 DIAGNOSIS — I5032 Chronic diastolic (congestive) heart failure: Secondary | ICD-10-CM | POA: Diagnosis not present

## 2017-10-24 DIAGNOSIS — N184 Chronic kidney disease, stage 4 (severe): Secondary | ICD-10-CM | POA: Diagnosis not present

## 2017-10-24 DIAGNOSIS — I25118 Atherosclerotic heart disease of native coronary artery with other forms of angina pectoris: Secondary | ICD-10-CM | POA: Diagnosis not present

## 2017-10-24 NOTE — Progress Notes (Signed)
Follow-up Outpatient Visit Date: 10/24/2017  Primary Care Provider: [name], MD [address] [place] [address]  Chief Complaint: Hospital follow-up for acute diastolic heart failure and NSTEMI  HPI:  Matthew Rowe is a 57 y.o. 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 He was admitted to [facility] on 10/15/17 due to worsening edema, shortness of breath, and chest pain. Troponin was minimally elevated and remained flat during the admission. He was aggressively diuresed improvement in his symptoms. Cardiac catheterization was again discussed, but given his chronic kidney disease, Matthew Rowe wished to avoid this.  Today, Matthew Rowe reports that he is feeling close to his baseline again.  He has minimal edema and is near his baseline weight of 230.  He has been trying to watch his fluid and sodium intake.  He has taken occasional extra doses of furosemide for leg swelling.  He is compliant with isosorbide mononitrate and ranolazine.  He has not had any significant chest pain since being discharged from the hospital earlier this month.  He still takes occasional sublingual nitroglycerin before he does much activity to help prevent developing chest pain.  He has not had any palpitations or lightheadedness.  Matthew Rowe is scheduled to follow-up with his nephrologist, Dr. Justin Mend, next month.  At their last visit, dialysis was discussed, and Matthew Rowe is most interested in peritoneal dialysis when it becomes time to pursue renal replacement therapy.  He is currently on furosemide 40 mg twice daily and reports brisk urine output with this.  Matthew Rowe was also recently  seen by cardiologist at the [facility].  Cardiac catheterization was also discussed at that time, though it was again deferred owing to his advanced CKD.  --------------------------------------------------------------------------------------------------  Cardiovascular History & Procedures: Cardiovascular Problems:  Coronary artery disease with chronic stable angina  Ischemic cardiomyopathy and chronic diastolic heart failure  Risk Factors:  Known coronary artery disease, hypertension, hyperlipidemia, diabetes mellitus, and male gender  Cath/PCI:  LHC (12/02/13): LMCA with 30% distal stenosis. LAD with multiple stents and 50% ostial stenosis followed by 20% diffuse in-stent restenosis proximally. There is diffuse 50% in-stent restenosis in the mid and distal segments. The distal LAD is occluded. LCx is small with 40-50% ostial stenosis. OM 2 has a 50% ostial stenosis. Ramus intermedius is a large vessel with 70-80% stenosis involving its superior branch. 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 There is diffuse 20% proximal disease. There is a 90% stenosis involving the distal vessel just beyond the anastomosis with the RIMA. 60% stenosis extending into the RPDA is also evident. RIMA to RCA is widely patent with aforementioned stenosis just beyond the distal anastomosis.  PCI (12/03/13): Successful PCI to the mid/distal RCA with overlapping Promus drug-eluting stents (2.25 x 24 and 2.25 x 16 mm).  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  EP Procedures and Devices:  None  Non-Invasive Evaluation(s):  TTE (10/16/17): Normal LV size with moderate LVH. LVEF 55-60% with akinesis of the apical anteroseptal and apical segments. Grade 2 diastolic dysfunction. Mild aortic regurgitation. Mild to moderate mitral regurgitation. Biatrial enlargement and mild to moderate TR. Normal RV  size and function.  Transthoracic echocardiogram  (12/18/16): Normal LV size with moderate LVH. LVEF mildly reduced at 45-50% with anterior, apical septal, apical, and apical inferoapical akinesis. Grade 1 diastolic dysfunction. Trileaflet aortic valve with mild stenosis (mean gradient 14 mmHg). Moderately dilated left ventricle. Mildly dilated right ventricle with mildly reduced contraction. Normal pulmonary artery and central venous pressure.  Pharmacologic myocardial perfusion stress test (11/16/16): High risk study with large fixed inferior defect extending to the apex. Dilated left ventricle with akinesis of the inferior wall. LVEF 35%.  Transthoracic echocardiogram (12/18/16): Normal LV size with moderate LVH. LVEF mildly reduced (45-50%). There is anterior and apical akinesis suggestive of LAD disease. Mild aortic stenosis noted with a mean gradient of 14 mmHg. Left atrium moderately dilated. Right ventricle mildly dilated and mildly reduced contraction.  Pharmacologic myocardial perfusion stress test (11/16/16): Large fixed differential defect involving the inferior wall and apex consistent with scar. LVEF 35% with inferior akinesis.  Transthoracic echocardiogram (01/02/15): Normal LV size with mild LVH. LVEF 50-55% with normal wall motion and grade 1 diastolic dysfunction. Very mild aortic stenosis. Mitral and or calcium patient with mild leaflet thickening and regurgitation. Normal RV size and function.  Pharmacologic myocardial perfusion stress test (11/28/13): Large, partially reversible inferolateral defect. Fixed apical defect. LVEF 54%.  Recent CV Pertinent Labs: Lab Results  Component Value Date   CHOL 161 10/16/2017   CHOL 161 11/12/2016   HDL 41 10/16/2017   HDL 31 (L) 11/12/2016   LDLCALC 80 10/16/2017   LDLCALC 86 11/12/2016   LDLDIRECT 72.0 11/03/2014   TRIG 201 (H) 10/16/2017   CHOLHDL 3.9 10/16/2017   INR 1.00 10/15/2017   BNP 962.0 (H) 02/08/2017   K 3.7 10/18/2017   MG 1.6 (L) 04/04/2016   BUN 45 (H) 10/18/2017   BUN 35  (H) 03/29/2017   CREATININE 3.95 (H) 10/18/2017   CREATININE 2.47 (H) 04/19/2016    Past medical and surgical history were reviewed and updated in EPIC.  Current Meds  Medication Sig  . acetaminophen (TYLENOL) 500 MG tablet Take 500 mg by mouth 3 (three) times daily as needed (for pain or headaches).   Marland Kitchen allopurinol (ZYLOPRIM) 100 MG tablet Take 100 mg by mouth every morning.  Marland Kitchen amLODipine (NORVASC) 10 MG tablet Take 1 tablet (10 mg total) by mouth daily.  Marland Kitchen aspirin EC 81 MG EC tablet Take 1 tablet (81 mg total) by mouth daily.  Marland Kitchen atorvastatin (LIPITOR) 80 MG tablet Take 1 tablet (80 mg total) by mouth daily.  . carvedilol (COREG) 25 MG tablet Take 2 tablets (50 mg total) by mouth 2 (two) times daily.  . Cholecalciferol 2000 units TABS Take 2,000 Units by mouth every morning.  . clopidogrel (PLAVIX) 75 MG tablet TAKE ONE TABLET BY MOUTH ONCE DAILY WITH BREAKFAST (Patient taking differently: Take 75 mg by mouth once a day with breakfast)  . diphenhydrAMINE (BENADRYL) 25 MG tablet Take 25 mg by mouth every 6 (six) hours as needed for allergies or sleep.   . furosemide (LASIX) 40 MG tablet Take 1 tablet (40 mg total) by mouth 2 (two) times daily.  Marland Kitchen gabapentin (NEURONTIN) 300 MG capsule Take 1 capsule (300 mg total) by mouth 2 (two) times daily. (Patient taking differently: Take 300 mg by mouth 3 (three) times daily. )  . hydrALAZINE (APRESOLINE) 50 MG tablet Take 1 tablet (50 mg total) by mouth 3 (three) times daily.  . Insulin NPH Isophane & Regular (NOVOLIN 70/30 Gordon) Inject 20 Units into  the skin 2 (two) times daily.  . isosorbide mononitrate (IMDUR) 120 MG 24 hr tablet Take 1 tablet (120 mg total) by mouth 2 (two) times daily.  . Misc. Devices MISC by Does not apply route. C-PAP  . Multiple Vitamin (MULTIVITAMIN WITH MINERALS) TABS tablet Take 1 tablet by mouth daily.  . nitroGLYCERIN (NITROSTAT) 0.4 MG SL tablet Place 1 tablet (0.4 mg total) under the tongue every 5 (five) minutes as  needed for chest pain.  . Omega-3 Fatty Acids (FISH OIL) 1000 MG CAPS Take 2,000 mg by mouth 2 (two) times daily.  . pantoprazole (PROTONIX) 40 MG tablet Take 40 mg by mouth daily.   . ranitidine (ZANTAC) 300 MG tablet Take 300 mg by mouth at bedtime.  . ranolazine (RANEXA) 500 MG 12 hr tablet Take 1 tablet (500 mg total) by mouth 2 (two) times daily.  . traMADol (ULTRAM) 50 MG tablet Take 1 tablet (50 mg total) by mouth every 6 (six) hours as needed.  . vitamin E 400 UNIT capsule Take 400 Units by mouth every evening.   . zolpidem (AMBIEN) 10 MG tablet Take 10 mg by mouth at bedtime as needed.    Allergies: Ciprocin-fluocin-procin [fluocinolone acetonide]; Clarithromycin; Cleocin [clindamycin hcl]; Glimepiride [amaryl]; and Colchicine  Social History   Socioeconomic History  . Marital status: Married    Spouse name: [name]  . Number of children: 5  . Years of education: Not on file  . Highest education level: Not on file  Social Needs  . Financial resource strain: Not on file  . Food insecurity - worry: Not on file  . Food insecurity - inability: Not on file  . Transportation needs - medical: Not on file  . Transportation needs - non-medical: Not on file  Occupational History  . Occupation: retired    Comment: Chartered loss adjuster  Tobacco Use  . Smoking status: Former Smoker    Types: Cigarettes  . Smokeless tobacco: Never Used  . Tobacco comment: smoked some as a teenager (high school) not even a pack a week  Substance and Sexual Activity  . Alcohol use: No    Alcohol/week: 0.0 oz    Comment: 09/15/2012 "drank a little when I was young"  . Drug use: No  . Sexual activity: Yes  Other Topics Concern  . Not on file  Social History Narrative   Married and lives with wife in [place]. On disability.    Consumes about 2 Coke Zeros a day     Family History  Problem Relation Age of Onset  . Lung cancer Mother   . Anemia Mother   . Polycystic kidney disease Mother   . Diabetes  Father   . Heart attack Father 3       Died suddenly  . Heart failure Father   . Hyperlipidemia Father   . Hypertension Father   . Kidney failure Sister 5       Died  . Heart attack Sister   . Hypertension Sister   . Diabetes Sister   . Diabetes Brother   . Polycystic kidney disease Brother   . Diabetes Sister     Review of Systems: A 12-system review of systems was performed and was negative except as noted in the HPI.  --------------------------------------------------------------------------------------------------  Physical Exam: BP (!) 146/70   Pulse 75   Ht 5' 5.5" (1.664 m)   Wt 231 lb (104.8 kg)   SpO2 97%   BMI 37.86 kg/m   General: Obese man, seated  comfortably in the exam room. HEENT: No conjunctival pallor or scleral icterus. Moist mucous membranes.  OP clear. Neck: Supple without lymphadenopathy, thyromegaly, JVD, or HJR, though evaluation is limited by body habitus. Lungs: Normal work of breathing. Clear to auscultation bilaterally without wheezes or crackles. Heart: Regular rate and rhythm without murmurs, rubs, or gallops.  Unable to assess PMI due to body habitus. Abd: Bowel sounds present. Soft, NT/ND.  Unable to assess HSM due to body habitus. Ext: Trace ankle edema bilaterally. Radial, PT, and DP pulses are 2+ bilaterally. Skin: Warm and dry without rash.   Lab Results  Component Value Date   WBC 14.4 (H) 10/16/2017   HGB 9.8 (L) 10/16/2017   HCT 30.3 (L) 10/16/2017   MCV 86.6 10/16/2017   PLT 231 10/16/2017    Lab Results  Component Value Date   NA 134 (L) 10/18/2017   K 3.7 10/18/2017   CL 100 (L) 10/18/2017   CO2 20 (L) 10/18/2017   BUN 45 (H) 10/18/2017   CREATININE 3.95 (H) 10/18/2017   GLUCOSE 98 10/18/2017   ALT 17 10/15/2017    Lab Results  Component Value Date   CHOL 161 10/16/2017   HDL 41 10/16/2017   LDLCALC 80 10/16/2017   LDLDIRECT 72.0 11/03/2014   TRIG 201 (H) 10/16/2017   CHOLHDL 3.9 10/16/2017     --------------------------------------------------------------------------------------------------  ASSESSMENT AND PLAN: Coronary artery disease with stable angina During recent hospitalization, minimal troponin elevation was noted.  It remained flat.  I suspect this represented demand ischemia in the setting of acute on chronic diastolic heart failure.  Angina is back to baseline and reasonably well controlled with combination of carvedilol, amlodipine, isosorbide mononitrate, and ranolazine.  We will continue this regimen as well as indefinite DAPT with aspirin and clopidogrel.  When Matthew Rowe has progressed to ESRD and is on dialysis or will be starting dialysis imminently, we will need to consider catheterization to better understand his coronary anatomy.  Chronic diastolic heart failure Matthew Rowe appears euvolemic on exam today.  He has stable NYHA class II-III symptoms.  We will continue his current regimen of carvedilol, hydralazine, isosorbide mononitrate, and furosemide 40 mg twice daily.  He is not on an ACEI/ARB or aldosterone antagonist due to his advanced CKD.  We discussed the importance of sodium restriction and daily weights.  I advised Matthew Rowe to take an extra dose of furosemide if he gains more than 2 pounds in a day or 5 pounds in a week.  Hypertension Blood pressure mildly elevated today.  I asked Matthew Rowe to continue watching this at home.  We will not make any medication changes at this time.  Hyperlipidemia Continue atorvastatin 80 mg daily.  Most recent LDL this month was slightly above goal at 80.  Hopefully, weight loss and dietary changes will get Matthew Rowe to goal.  Chronic kidney disease stage 4 Continue follow-up with Dr. Justin Mend. Avoid nephrotoxic drugs, including IV contrast, unless chest pain worsens or there is objective evidence of ongoing myocardial ischemia.  Follow-up: Return to clinic in 2 months.  Matthew Bush, MD 10/25/2017 3:13 PM

## 2017-10-24 NOTE — Patient Instructions (Addendum)
Medication Instructions:  Your physician recommends that you continue on your current medications as directed. Please refer to the Current Medication list given to you today.  -- If you need a refill on your cardiac medications before your next appointment, please call your pharmacy. --  Labwork: None ordered  Testing/Procedures: None ordered  Follow-Up: Your physician wants you to follow-up in: 2 months with Dr. Saunders Revel.    Thank you for choosing [facility]!!    Any Other Special Instructions Will Be Listed Below (If Applicable).  Weigh yourself daily.. If you gain more than 2 pounds in a day or 5 pounds in a week, please take an extra Lasix

## 2017-10-25 ENCOUNTER — Encounter: Payer: Self-pay | Admitting: Internal Medicine

## 2017-10-25 ENCOUNTER — Telehealth: Payer: Self-pay | Admitting: Neurology

## 2017-10-25 DIAGNOSIS — I25118 Atherosclerotic heart disease of native coronary artery with other forms of angina pectoris: Secondary | ICD-10-CM | POA: Insufficient documentation

## 2017-10-25 DIAGNOSIS — I5032 Chronic diastolic (congestive) heart failure: Secondary | ICD-10-CM | POA: Insufficient documentation

## 2017-10-25 DIAGNOSIS — I5042 Chronic combined systolic (congestive) and diastolic (congestive) heart failure: Secondary | ICD-10-CM | POA: Insufficient documentation

## 2017-10-25 NOTE — Telephone Encounter (Signed)
FYI Under the advisement of RN Katrina since it has been since 2016 that pt was last seen in office pt wife was called to inform no orders can be done.  Pt wife voicemail is full with no option to leave a voicemail.

## 2017-10-25 NOTE — Telephone Encounter (Signed)
Pt wife (on Dpr) calling stating that thru [place] they are trying to get 02 Equipment, set comfort care with a stand alone unit.  Pt wife states an order is needed for [employer] their [phone] Pt wife states Mcarthur Rossetti has approved. Pt wife is asking to be called

## 2017-10-25 NOTE — Telephone Encounter (Signed)
Staff tried to call wife. Pt was last seen 02/2015. VM was full. Pt needs appt with the NP or Dr. Rexene Alberts to discuss 02 if needed.

## 2017-10-28 NOTE — Telephone Encounter (Signed)
I called pt's wife, [name], per DPR. I advised her that before any orders can be generated, pt will need to be seen in the office since he has not been seen in over one year. I also advised pt's wife that Dr. Rexene Alberts does not order oxygen, she takes care of cpap orders. Pt's wife says that he needs oxygen ordered. Pt has an appt with the Bernice on Thursday and pt's wife says that after that appt, she will call us to schedule his follow up with Dr. Rexene Alberts, with the understanding that Dr. Rexene Alberts handles cpap orders, not O2 orders.

## 2017-10-30 DIAGNOSIS — G4733 Obstructive sleep apnea (adult) (pediatric): Secondary | ICD-10-CM | POA: Diagnosis not present

## 2017-10-30 DIAGNOSIS — N184 Chronic kidney disease, stage 4 (severe): Secondary | ICD-10-CM | POA: Diagnosis not present

## 2017-10-30 DIAGNOSIS — I5033 Acute on chronic diastolic (congestive) heart failure: Secondary | ICD-10-CM | POA: Diagnosis not present

## 2017-10-30 DIAGNOSIS — Z9989 Dependence on other enabling machines and devices: Secondary | ICD-10-CM | POA: Diagnosis not present

## 2017-10-30 DIAGNOSIS — I2510 Atherosclerotic heart disease of native coronary artery without angina pectoris: Secondary | ICD-10-CM | POA: Diagnosis not present

## 2017-10-30 DIAGNOSIS — E119 Type 2 diabetes mellitus without complications: Secondary | ICD-10-CM | POA: Diagnosis not present

## 2017-11-20 DIAGNOSIS — T383X5A Adverse effect of insulin and oral hypoglycemic [antidiabetic] drugs, initial encounter: Secondary | ICD-10-CM | POA: Diagnosis not present

## 2017-11-20 DIAGNOSIS — N184 Chronic kidney disease, stage 4 (severe): Secondary | ICD-10-CM | POA: Diagnosis not present

## 2017-11-20 DIAGNOSIS — I10 Essential (primary) hypertension: Secondary | ICD-10-CM | POA: Diagnosis not present

## 2017-11-20 DIAGNOSIS — E119 Type 2 diabetes mellitus without complications: Secondary | ICD-10-CM | POA: Diagnosis not present

## 2017-11-20 DIAGNOSIS — E161 Other hypoglycemia: Secondary | ICD-10-CM | POA: Diagnosis not present

## 2017-11-20 DIAGNOSIS — E160 Drug-induced hypoglycemia without coma: Secondary | ICD-10-CM | POA: Diagnosis not present

## 2017-11-20 DIAGNOSIS — Z794 Long term (current) use of insulin: Secondary | ICD-10-CM | POA: Diagnosis not present

## 2017-11-20 DIAGNOSIS — I2510 Atherosclerotic heart disease of native coronary artery without angina pectoris: Secondary | ICD-10-CM | POA: Diagnosis not present

## 2017-12-06 DIAGNOSIS — G4733 Obstructive sleep apnea (adult) (pediatric): Secondary | ICD-10-CM | POA: Diagnosis not present

## 2017-12-06 DIAGNOSIS — E785 Hyperlipidemia, unspecified: Secondary | ICD-10-CM | POA: Diagnosis not present

## 2017-12-06 DIAGNOSIS — I2510 Atherosclerotic heart disease of native coronary artery without angina pectoris: Secondary | ICD-10-CM | POA: Diagnosis not present

## 2017-12-06 DIAGNOSIS — M1A9XX0 Chronic gout, unspecified, without tophus (tophi): Secondary | ICD-10-CM | POA: Diagnosis not present

## 2017-12-06 DIAGNOSIS — N184 Chronic kidney disease, stage 4 (severe): Secondary | ICD-10-CM | POA: Diagnosis not present

## 2017-12-06 DIAGNOSIS — E1169 Type 2 diabetes mellitus with other specified complication: Secondary | ICD-10-CM | POA: Diagnosis not present

## 2017-12-06 DIAGNOSIS — I10 Essential (primary) hypertension: Secondary | ICD-10-CM | POA: Diagnosis not present

## 2017-12-06 DIAGNOSIS — Z0000 Encounter for general adult medical examination without abnormal findings: Secondary | ICD-10-CM | POA: Diagnosis not present

## 2017-12-06 DIAGNOSIS — E1122 Type 2 diabetes mellitus with diabetic chronic kidney disease: Secondary | ICD-10-CM | POA: Diagnosis not present

## 2017-12-11 DIAGNOSIS — N183 Chronic kidney disease, stage 3 (moderate): Secondary | ICD-10-CM | POA: Diagnosis not present

## 2017-12-11 DIAGNOSIS — N2581 Secondary hyperparathyroidism of renal origin: Secondary | ICD-10-CM | POA: Diagnosis not present

## 2017-12-11 DIAGNOSIS — Q613 Polycystic kidney, unspecified: Secondary | ICD-10-CM | POA: Diagnosis not present

## 2017-12-11 DIAGNOSIS — I2510 Atherosclerotic heart disease of native coronary artery without angina pectoris: Secondary | ICD-10-CM | POA: Diagnosis not present

## 2017-12-11 DIAGNOSIS — I129 Hypertensive chronic kidney disease with stage 1 through stage 4 chronic kidney disease, or unspecified chronic kidney disease: Secondary | ICD-10-CM | POA: Diagnosis not present

## 2017-12-11 DIAGNOSIS — D631 Anemia in chronic kidney disease: Secondary | ICD-10-CM | POA: Diagnosis not present

## 2017-12-11 DIAGNOSIS — I219 Acute myocardial infarction, unspecified: Secondary | ICD-10-CM | POA: Diagnosis not present

## 2017-12-26 ENCOUNTER — Other Ambulatory Visit: Payer: Self-pay | Admitting: Internal Medicine

## 2017-12-26 ENCOUNTER — Ambulatory Visit: Payer: Medicare HMO | Admitting: Internal Medicine

## 2017-12-26 MED ORDER — NITROGLYCERIN 0.4 MG SL SUBL: 0.4000 mg | SUBLINGUAL_TABLET | SUBLINGUAL | 5 refills | Status: DC | PRN

## 2017-12-26 NOTE — Telephone Encounter (Signed)
°*  STAT* If patient is at the pharmacy, call can be transferred to refill team.   1. Which medications need to be refilled? (please list name of each medication and dose if known) Nitroglyrcerin 0.4 mg  2. Which pharmacy/location (including street and city if local pharmacy) is medication to be sent to? [place], [place] - [address]  3. Do they need a 30 day or 90 day supply?30  Patient out of medication

## 2017-12-26 NOTE — Progress Notes (Deleted)
Follow-up Outpatient Visit Date: 12/26/2017  Primary Care Provider: [name], MD [address] [place] [address]  Chief Complaint: ***  HPI:  Matthew Rowe is a 57 y.o. 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  I last saw him 2 months ago after preceding hospitalization for NSTEMI.  He did not wish to undergo invasive management at that time due to his advanced kidney disease.  At our visit, he reported feeling back to his baseline without any further episodes of chest pain since being discharged.  He was taking occasional sublingual nitroglycerin in order to prevent chest pain with activity.  His weight was close to baseline at 230 pounds.  He was scheduled to follow-up with Dr. Justin Mend last month for further discussion of peritoneal dialysis, given worsening renal insufficiency.  --------------------------------------------------------------------------------------------------  Cardiovascular History & Procedures: Cardiovascular Problems:  Coronary artery disease with chronic stable angina  Ischemic cardiomyopathy and chronicdiastolic heart failure  Risk Factors:  Known coronary artery disease, hypertension, hyperlipidemia, diabetes mellitus, and male gender  Cath/PCI:  LHC (12/02/13): LMCA with 30% distal stenosis. LAD with multiple stents and 50% ostial stenosis followed by 20% diffuse in-stent restenosis proximally. There is diffuse 50% in-stent restenosis in the mid and distal segments. The distal LAD is occluded. LCx is small with 40-50% ostial stenosis. OM 2 has a 50% ostial stenosis. Ramus intermedius is a large vessel with 70-80% stenosis involving its superior branch. RCA has an anomalous origin from the left coronary  cusp. There is diffuse 20% proximal disease. There is a 90% stenosis involving the distal vessel just beyond the anastomosis with the RIMA. 60% stenosis extending into the RPDA is also evident. RIMA to RCA is widely patent with aforementioned stenosis just beyond the distal anastomosis.  PCI (12/03/13): Successful PCI to the mid/distal RCA with overlapping Promus drug-eluting stents (2.25 x 24 and 2.25 x 16 mm).  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  EP Procedures and Devices:  None  Non-Invasive Evaluation(s):  TTE (10/16/17): Normal LV size with moderate LVH. LVEF 55-60% with akinesis of the apical anteroseptal and apical segments. Grade 2 diastolic dysfunction. Mild aortic regurgitation. Mild to moderate mitral regurgitation. Biatrial enlargement and mild to moderate TR. Normal RV size and function.  Transthoracic echocardiogram (12/18/16): Normal LV size with moderate LVH. LVEF mildly reduced at 45-50% with anterior, apical septal, apical, and apical inferoapical akinesis. Grade 1 diastolic dysfunction. Trileaflet aortic valve with mild stenosis (mean gradient 14 mmHg). Moderately dilated left ventricle. Mildly dilated right ventricle with mildly reduced contraction. Normal pulmonary artery and central venous pressure.  Pharmacologic myocardial perfusion stress test (11/16/16): High risk study with large fixed inferior defect extending to the apex. Dilated left ventricle with akinesis of the inferior wall. LVEF 35%.  Transthoracic echocardiogram (12/18/16): Normal LV size with moderate LVH. LVEF mildly reduced (45-50%). There is anterior and apical akinesis suggestive of LAD disease. Mild aortic stenosis noted with a mean gradient of 14 mmHg. Left atrium moderately dilated. Right ventricle mildly dilated and mildly reduced contraction.  Pharmacologic myocardial perfusion stress test (11/16/16): Large fixed differential defect involving the  inferior wall and apex consistent with scar. LVEF 35% with inferior akinesis.  Transthoracic echocardiogram (01/02/15):  Normal LV size with mild LVH. LVEF 50-55% with normal wall motion and grade 1 diastolic dysfunction. Very mild aortic stenosis. Mitral and or calcium patient with mild leaflet thickening and regurgitation. Normal RV size and function.  Pharmacologic myocardial perfusion stress test (11/28/13): Large, partially reversible inferolateral defect. Fixed apical defect. LVEF 54%.   Recent CV Pertinent Labs: Lab Results  Component Value Date   CHOL 161 10/16/2017   CHOL 161 11/12/2016   HDL 41 10/16/2017   HDL 31 (L) 11/12/2016   LDLCALC 80 10/16/2017   LDLCALC 86 11/12/2016   LDLDIRECT 72.0 11/03/2014   TRIG 201 (H) 10/16/2017   CHOLHDL 3.9 10/16/2017   INR 1.00 10/15/2017   BNP 962.0 (H) 02/08/2017   K 3.7 10/18/2017   MG 1.6 (L) 04/04/2016   BUN 45 (H) 10/18/2017   BUN 35 (H) 03/29/2017   CREATININE 3.95 (H) 10/18/2017   CREATININE 2.47 (H) 04/19/2016    Past medical and surgical history were reviewed and updated in EPIC.  No outpatient medications have been marked as taking for the 12/26/17 encounter (Appointment) with Matthew Rowe, Matthew Gave, MD.    Allergies: Ciprocin-fluocin-procin [fluocinolone acetonide]; Clarithromycin; Cleocin [clindamycin hcl]; Glimepiride [amaryl]; and Colchicine  Social History   Socioeconomic History  . Marital status: Married    Spouse name: [name]  . Number of children: 5  . Years of education: Not on file  . Highest education level: Not on file  Occupational History  . Occupation: retired    Comment: Chartered loss adjuster  Social Needs  . Financial resource strain: Not on file  . Food insecurity:    Worry: Not on file    Inability: Not on file  . Transportation needs:    Medical: Not on file    Non-medical: Not on file  Tobacco Use  . Smoking status: Former Smoker    Types: Cigarettes  . Smokeless tobacco: Never Used  . Tobacco  comment: smoked some as a teenager (high school) not even a pack a week  Substance and Sexual Activity  . Alcohol use: No    Alcohol/week: 0.0 oz    Comment: 09/15/2012 "drank a little when I was young"  . Drug use: No  . Sexual activity: Yes  Lifestyle  . Physical activity:    Days per week: Not on file    Minutes per session: Not on file  . Stress: Not on file  Relationships  . Social connections:    Talks on phone: Not on file    Gets together: Not on file    Attends religious service: Not on file    Active member of club or organization: Not on file    Attends meetings of clubs or organizations: Not on file    Relationship status: Not on file  . Intimate partner violence:    Fear of current or ex partner: Not on file    Emotionally abused: Not on file    Physically abused: Not on file    Forced sexual activity: Not on file  Other Topics Concern  . Not on file  Social History Narrative   Married and lives with wife in [place]. On disability.    Consumes about 2 Coke Zeros a day     Family History  Problem Relation Age of Onset  . Lung cancer Mother   . Anemia Mother   . Polycystic kidney disease Mother   . Diabetes Father   . Heart attack Father 63       Died  suddenly  . Heart failure Father   . Hyperlipidemia Father   . Hypertension Father   . Kidney failure Sister 5       Died  . Heart attack Sister   . Hypertension Sister   . Diabetes Sister   . Diabetes Brother   . Polycystic kidney disease Brother   . Diabetes Sister     Review of Systems: A 12-system review of systems was performed and was negative except as noted in the HPI.  --------------------------------------------------------------------------------------------------  Physical Exam: There were no vitals taken for this visit.  General:  *** HEENT: No conjunctival pallor or scleral icterus. Moist mucous membranes.  OP clear. Neck: Supple without lymphadenopathy, thyromegaly, JVD, or HJR. No  carotid bruit. Lungs: Normal work of breathing. Clear to auscultation bilaterally without wheezes or crackles. Heart: Regular rate and rhythm without murmurs, rubs, or gallops. Non-displaced PMI. Abd: Bowel sounds present. Soft, NT/ND without hepatosplenomegaly Ext: No lower extremity edema. Radial, PT, and DP pulses are 2+ bilaterally. Skin: Warm and dry without rash.  EKG:  ***  Lab Results  Component Value Date   WBC 14.4 (H) 10/16/2017   HGB 9.8 (L) 10/16/2017   HCT 30.3 (L) 10/16/2017   MCV 86.6 10/16/2017   PLT 231 10/16/2017    Lab Results  Component Value Date   NA 134 (L) 10/18/2017   K 3.7 10/18/2017   CL 100 (L) 10/18/2017   CO2 20 (L) 10/18/2017   BUN 45 (H) 10/18/2017   CREATININE 3.95 (H) 10/18/2017   GLUCOSE 98 10/18/2017   ALT 17 10/15/2017    Lab Results  Component Value Date   CHOL 161 10/16/2017   HDL 41 10/16/2017   LDLCALC 80 10/16/2017   LDLDIRECT 72.0 11/03/2014   TRIG 201 (H) 10/16/2017   CHOLHDL 3.9 10/16/2017    --------------------------------------------------------------------------------------------------  ASSESSMENT AND PLAN: Matthew Rowe Melinda Gwinner, MD 12/26/2017 7:44 AM

## 2017-12-26 NOTE — Telephone Encounter (Signed)
Pt's medication was sent to pt's pharmacy as requested. Confirmation received.  °

## 2017-12-27 ENCOUNTER — Other Ambulatory Visit: Payer: Self-pay | Admitting: Internal Medicine

## 2017-12-27 MED ORDER — FUROSEMIDE 40 MG PO TABS: 40.0000 mg | ORAL_TABLET | Freq: Two times a day (BID) | ORAL | 2 refills | Status: DC

## 2017-12-27 NOTE — Telephone Encounter (Signed)
New message     *STAT* If patient is at the pharmacy, call can be transferred to refill team.   1. Which medications need to be refilled? (please list name of each medication and dose if known) Furosemide 40 mg   2. Which pharmacy/location (including street and city if local pharmacy) is medication to be sent to? [place]  3. Do they need a 30 day or 90 day supply? 90 day

## 2017-12-27 NOTE — Telephone Encounter (Signed)
Pt's medication was sent to pt's pharmacy as requested. Confirmation received.  °

## 2018-01-11 IMAGING — CR DG CHEST 2V
2 series · 2 of 2 positions shown · non-contrast
Comparison: 07/17/2015.

CLINICAL DATA: Chest pain

EXAM:
CHEST  2 VIEW

[chest pa]
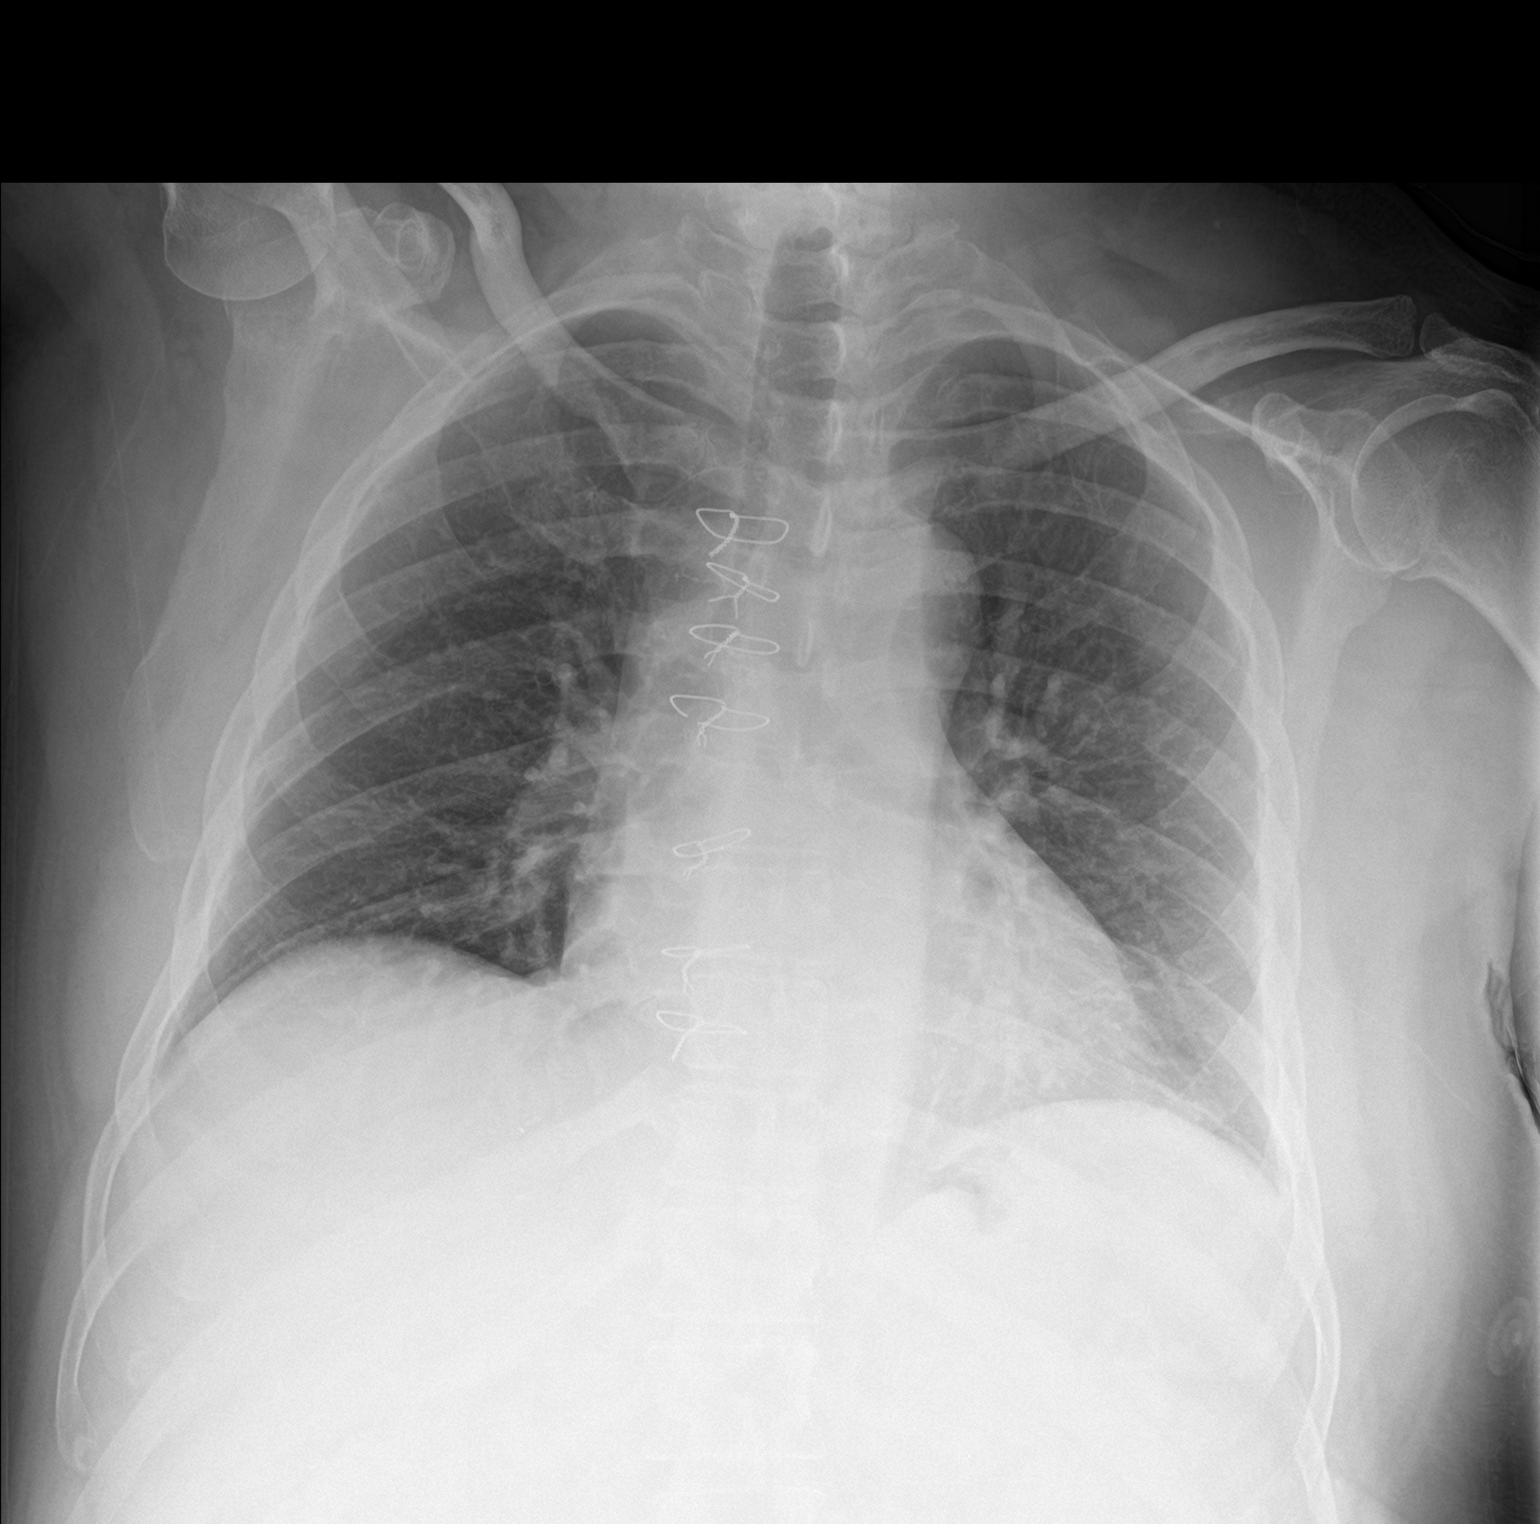

[chest lat]
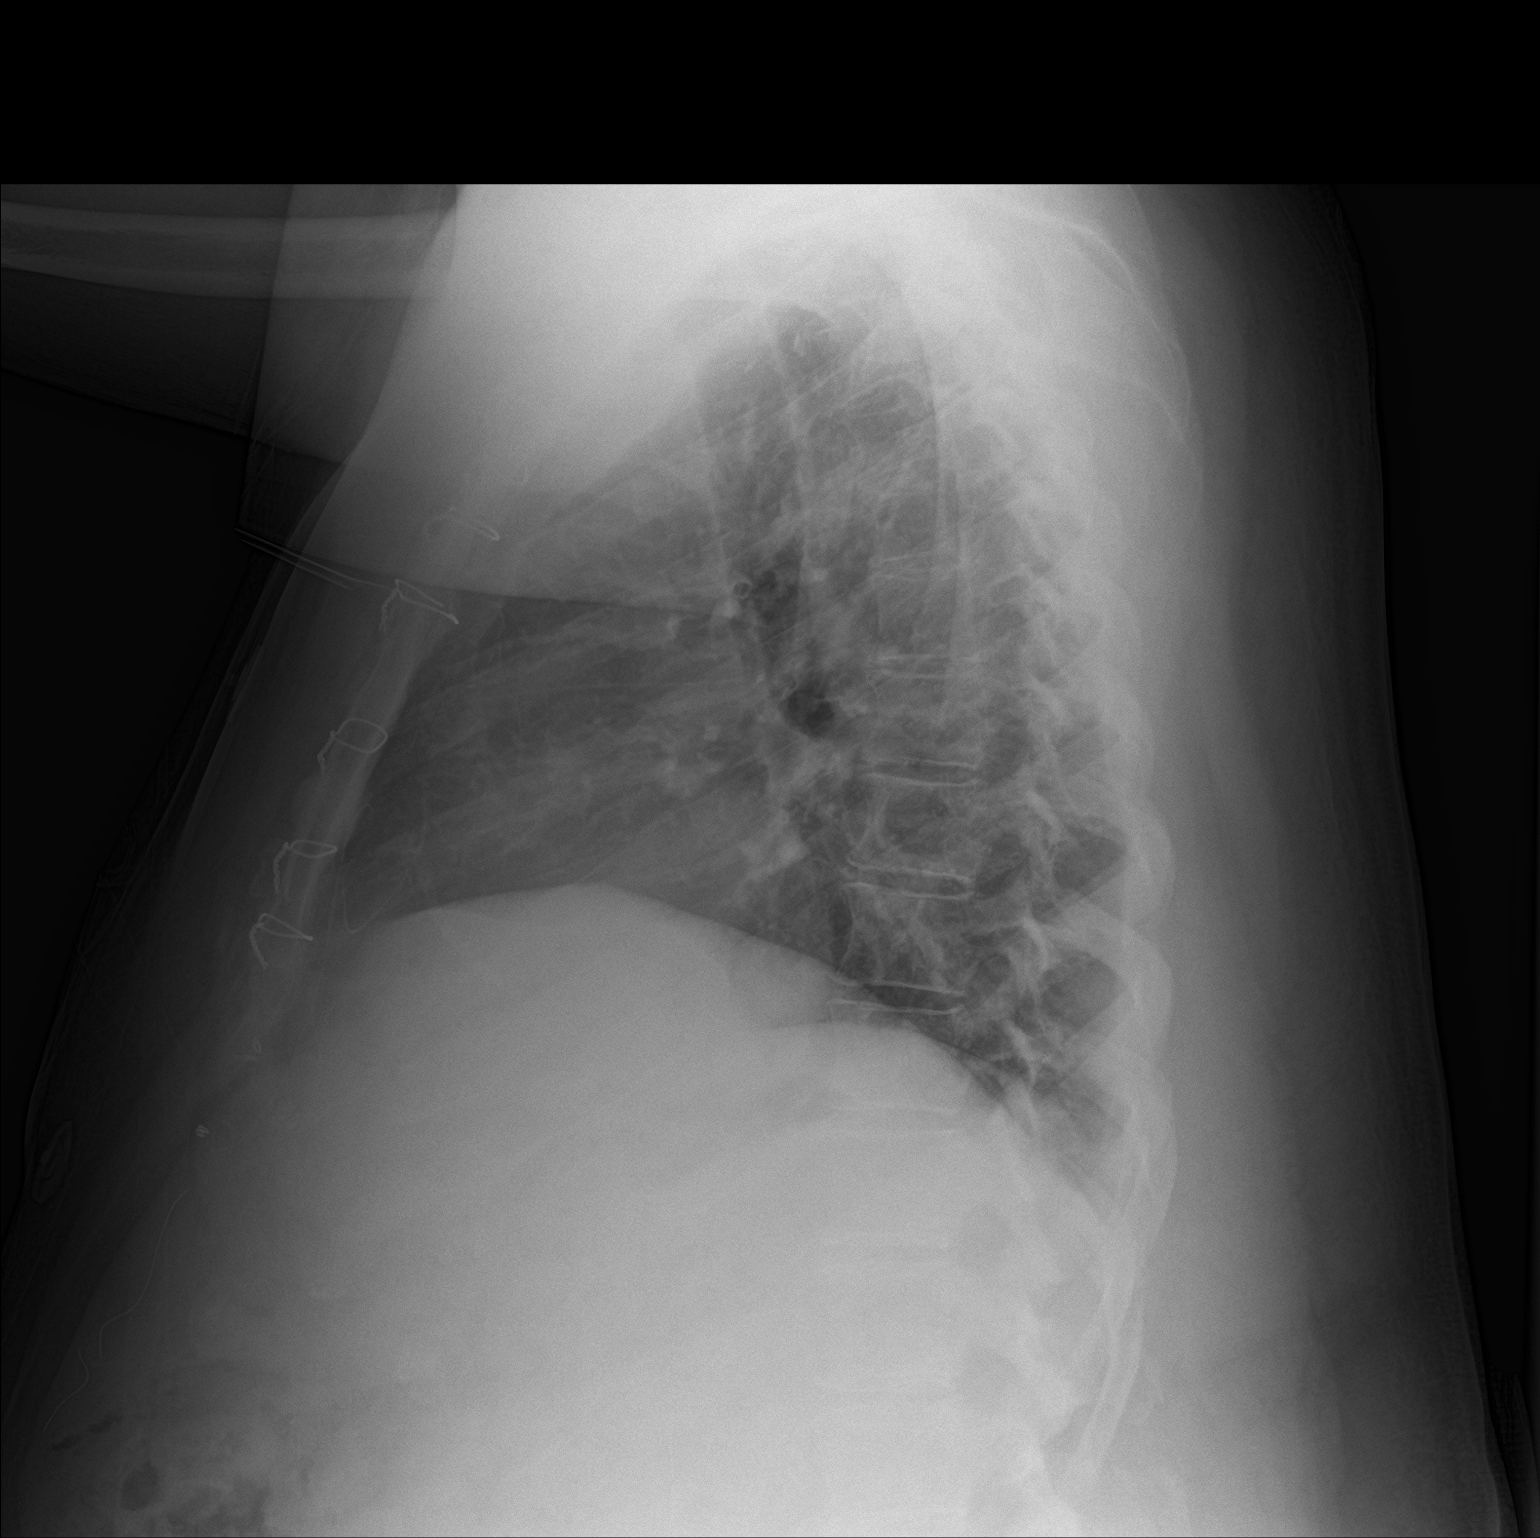

[2 of 2 positions shown; findings below may reference images not displayed]

FINDINGS: The heart size and mediastinal contours are within normal limits.
Both lungs are clear. The visualized skeletal structures are
unremarkable.
IMPRESSION: No active cardiopulmonary disease.

## 2018-01-31 ENCOUNTER — Telehealth: Payer: Self-pay | Admitting: Internal Medicine

## 2018-01-31 MED ORDER — CARVEDILOL 25 MG PO TABS: 50.0000 mg | ORAL_TABLET | Freq: Two times a day (BID) | ORAL | 1 refills | Status: DC

## 2018-01-31 NOTE — Telephone Encounter (Signed)
Pt calling   Stating he is returning call to nurse. Please call pt.

## 2018-01-31 NOTE — Telephone Encounter (Signed)
Patient stated he was calling to request a refill of carvedilol 25 mg. He confirmed he takes 50 mg BID. Discussed with patient. Will send 28 tablets (7 day supply) to [facility] on [address] and then 90 day supply to [facility]. Pt is appreciative for assistance provided.

## 2018-02-04 ENCOUNTER — Other Ambulatory Visit: Payer: Self-pay

## 2018-02-12 ENCOUNTER — Encounter: Payer: Self-pay | Admitting: Internal Medicine

## 2018-02-12 ENCOUNTER — Other Ambulatory Visit: Payer: Self-pay

## 2018-02-12 ENCOUNTER — Ambulatory Visit (INDEPENDENT_AMBULATORY_CARE_PROVIDER_SITE_OTHER): Payer: Medicare HMO | Admitting: Internal Medicine

## 2018-02-12 VITALS — BP 136/80 | HR 75 | Ht 65.5 in | Wt 246.0 lb

## 2018-02-12 DIAGNOSIS — I10 Essential (primary) hypertension: Secondary | ICD-10-CM | POA: Diagnosis not present

## 2018-02-12 DIAGNOSIS — D631 Anemia in chronic kidney disease: Secondary | ICD-10-CM

## 2018-02-12 DIAGNOSIS — I110 Hypertensive heart disease with heart failure: Secondary | ICD-10-CM | POA: Diagnosis not present

## 2018-02-12 DIAGNOSIS — D649 Anemia, unspecified: Secondary | ICD-10-CM | POA: Diagnosis not present

## 2018-02-12 DIAGNOSIS — I25118 Atherosclerotic heart disease of native coronary artery with other forms of angina pectoris: Secondary | ICD-10-CM | POA: Diagnosis not present

## 2018-02-12 DIAGNOSIS — N184 Chronic kidney disease, stage 4 (severe): Secondary | ICD-10-CM | POA: Diagnosis not present

## 2018-02-12 DIAGNOSIS — E785 Hyperlipidemia, unspecified: Secondary | ICD-10-CM | POA: Diagnosis not present

## 2018-02-12 DIAGNOSIS — I5032 Chronic diastolic (congestive) heart failure: Secondary | ICD-10-CM | POA: Diagnosis not present

## 2018-02-12 LAB — IRON AND TIBC
Iron Saturation: 13 % — ABNORMAL LOW (ref 15–55)
Iron: 43 ug/dL (ref 38–169)
TIBC: 329 ug/dL (ref 250–450)
UIBC: 286 ug/dL (ref 111–343)

## 2018-02-12 LAB — FERRITIN: Ferritin: 26 ng/mL — ABNORMAL LOW (ref 30–400)

## 2018-02-12 NOTE — Patient Instructions (Addendum)
Medication Instructions:  Your physician recommends that you continue on your current medications as directed. Please refer to the Current Medication list given to you today.  -- If you need a refill on your cardiac medications before your next appointment, please call your pharmacy. --  Labwork: TODAY TIBC Ferritin  Testing/Procedures: None ordered  Follow-Up: Your physician wants you to follow-up in: 3 MONTHS with Dr. Saunders Revel.    PLEASE Enroll in My Chart, thank you    Thank you for choosing [facility]!!    Any Other Special Instructions Will Be Listed Below (If Applicable).

## 2018-02-12 NOTE — Progress Notes (Signed)
Follow-up Outpatient Visit Date: 02/12/2018  Primary Care Provider: [name], MD [address] [place] [address]  Chief Complaint: Follow-up coronary artery disease and heart failure  HPI:  Mr. Faiola is a 57 y.o. 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  I last saw Mr. Bostwick in February after a preceding hospitalization with chest pain and elevated troponin in the setting of acute diastolic heart failure exacerbation.  At his follow-up visit with [place], he was back to his baseline with minimal edema.  He had not had any further episodes of chest pain, though he was taking "prophylactic" sublingual nitroglycerin before most activity.  He was also scheduled to see his nephrologist, Dr. Justin Mend, to discuss dialysis options given advanced CKD.  Today, Mr. Schetter reports feeling well.  He denies chest pain or shortness of breath.  He experienced weight gain and edema last week, though this has improved after furosemide was increased from 40 mg BID to 80 mg QAM and 40 mg QPM by his [place].  He also notes that an echocardiogram was performed at the [place] as part of kidney transplant evaluation since our last visit.  He does not recall the results.  Mr. Melucci has been able to procure a steady supply of ranolazine from the [place]; feels like this is controlling his chest pain well.  He is taking all of his medications as prescribed.  He was evaluated by Dr. Justin Mend in March; given stable CKD, plans for dialysis are on hold.  --------------------------------------------------------------------------------------------------  Cardiovascular History & Procedures: Cardiovascular Problems:  Coronary artery disease with chronic  stable angina  Ischemic cardiomyopathy and chronicdiastolic heart failure  Risk Factors:  Known coronary artery disease, hypertension, hyperlipidemia, diabetes mellitus, and male gender  Cath/PCI:  LHC (12/02/13): LMCA with 30% distal stenosis. LAD with multiple stents and 50% ostial stenosis followed by 20% diffuse in-stent restenosis proximally. There is diffuse 50% in-stent restenosis in the mid and distal segments. The distal LAD is occluded. LCx is small with 40-50% ostial stenosis. OM 2 has a 50% ostial stenosis. Ramus intermedius is a large vessel with 70-80% stenosis involving its superior branch. 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 There is diffuse 20% proximal disease. There is a 90% stenosis involving the distal vessel just beyond the anastomosis with the RIMA. 60% stenosis extending into the RPDA is also evident. RIMA to RCA is widely patent with aforementioned stenosis just beyond the distal anastomosis.  PCI (12/03/13): Successful PCI to the mid/distal RCA with overlapping Promus drug-eluting stents (2.25 x 24 and 2.25 x 16 mm).  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  EP Procedures and Devices:  None  Non-Invasive Evaluation(s):  TTE (10/16/17): Normal LV size with moderate LVH. LVEF 55-60% with akinesis of the apical anteroseptal and apical segments. Grade 2 diastolic dysfunction. Mild aortic regurgitation. Mild to moderate mitral regurgitation. Biatrial enlargement and mild to moderate TR. Normal RV size and function.  Transthoracic echocardiogram (12/18/16): Normal LV size with moderate LVH. LVEF mildly reduced at 45-50% with anterior, apical septal, apical, and apical inferoapical akinesis. Grade 1 diastolic dysfunction. Trileaflet aortic valve with mild stenosis (mean gradient 14 mmHg). Moderately dilated left  ventricle. Mildly dilated right ventricle with mildly reduced contraction. Normal  pulmonary artery and central venous pressure.  Pharmacologic myocardial perfusion stress test (11/16/16): High risk study with large fixed inferior defect extending to the apex. Dilated left ventricle with akinesis of the inferior wall. LVEF 35%.  Transthoracic echocardiogram (12/18/16): Normal LV size with moderate LVH. LVEF mildly reduced (45-50%). There is anterior and apical akinesis suggestive of LAD disease. Mild aortic stenosis noted with a mean gradient of 14 mmHg. Left atrium moderately dilated. Right ventricle mildly dilated and mildly reduced contraction.  Pharmacologic myocardial perfusion stress test (11/16/16): Large fixed differential defect involving the inferior wall and apex consistent with scar. LVEF 35% with inferior akinesis.  Transthoracic echocardiogram (01/02/15): Normal LV size with mild LVH. LVEF 50-55% with normal wall motion and grade 1 diastolic dysfunction. Very mild aortic stenosis. Mitral and or calcium patient with mild leaflet thickening and regurgitation. Normal RV size and function.  Pharmacologic myocardial perfusion stress test (11/28/13): Large, partially reversible inferolateral defect. Fixed apical defect. LVEF 54%.   Recent CV Pertinent Labs: Lab Results  Component Value Date   CHOL 161 10/16/2017   CHOL 161 11/12/2016   HDL 41 10/16/2017   HDL 31 (L) 11/12/2016   LDLCALC 80 10/16/2017   LDLCALC 86 11/12/2016   LDLDIRECT 72.0 11/03/2014   TRIG 201 (H) 10/16/2017   CHOLHDL 3.9 10/16/2017   INR 1.00 10/15/2017   BNP 962.0 (H) 02/08/2017   K 3.7 10/18/2017   MG 1.6 (L) 04/04/2016   BUN 45 (H) 10/18/2017   BUN 35 (H) 03/29/2017   CREATININE 3.95 (H) 10/18/2017   CREATININE 2.47 (H) 04/19/2016    Past medical and surgical history were reviewed and updated in EPIC.  Current Meds  Medication Sig  . acetaminophen (TYLENOL) 500 MG tablet Take 500 mg by mouth 3 (three) times daily as needed (for pain or headaches).   Marland Kitchen allopurinol (ZYLOPRIM) 100 MG  tablet Take 100 mg by mouth every morning.  Marland Kitchen amLODipine (NORVASC) 10 MG tablet Take 1 tablet (10 mg total) by mouth daily.  Marland Kitchen aspirin EC 81 MG EC tablet Take 1 tablet (81 mg total) by mouth daily.  Marland Kitchen atorvastatin (LIPITOR) 80 MG tablet Take 1 tablet (80 mg total) by mouth daily.  . carvedilol (COREG) 25 MG tablet Take 2 tablets (50 mg total) by mouth 2 (two) times daily.  . Cholecalciferol 2000 units TABS Take 2,000 Units by mouth every morning.  . clopidogrel (PLAVIX) 75 MG tablet TAKE ONE TABLET BY MOUTH ONCE DAILY WITH BREAKFAST (Patient taking differently: Take 75 mg by mouth once a day with breakfast)  . diphenhydrAMINE (BENADRYL) 25 MG tablet Take 25 mg by mouth every 6 (six) hours as needed for allergies or sleep.   . furosemide (LASIX) 40 MG tablet Take 1 tablet (40 mg total) by mouth 2 (two) times daily.  Marland Kitchen gabapentin (NEURONTIN) 300 MG capsule Take 1 capsule (300 mg total) by mouth 2 (two) times daily. (Patient taking differently: Take 300 mg by mouth 3 (three) times daily. )  . hydrALAZINE (APRESOLINE) 50 MG tablet Take 1 tablet (50 mg total) by mouth 3 (three) times daily.  . Insulin NPH Isophane & Regular (NOVOLIN 70/30 [place]) Inject 20 Units into the skin 2 (two) times daily.  . isosorbide mononitrate (IMDUR) 120 MG 24 hr tablet Take 1 tablet (120 mg total) by mouth 2 (two) times daily.  . Misc. Devices MISC by Does not apply route. C-PAP  . Multiple Vitamin (  MULTIVITAMIN WITH MINERALS) TABS tablet Take 1 tablet by mouth daily.  . nitroGLYCERIN (NITROSTAT) 0.4 MG SL tablet Place 1 tablet (0.4 mg total) under the tongue every 5 (five) minutes as needed for chest pain.  . Omega-3 Fatty Acids (FISH OIL) 1000 MG CAPS Take 2,000 mg by mouth 2 (two) times daily.  . pantoprazole (PROTONIX) 40 MG tablet Take 40 mg by mouth daily.   . ranitidine (ZANTAC) 300 MG tablet Take 300 mg by mouth at bedtime.  . ranolazine (RANEXA) 500 MG 12 hr tablet Take 1 tablet (500 mg total) by mouth 2 (two) times  daily.  . traMADol (ULTRAM) 50 MG tablet Take 1 tablet (50 mg total) by mouth every 6 (six) hours as needed.  . vitamin E 400 UNIT capsule Take 400 Units by mouth every evening.   . zolpidem (AMBIEN) 10 MG tablet Take 10 mg by mouth at bedtime as needed.    Allergies: Ciprocin-fluocin-procin [fluocinolone acetonide]; Clarithromycin; Cleocin [clindamycin hcl]; Glimepiride [amaryl]; and Colchicine  Social History   Tobacco Use  . Smoking status: Former Smoker    Types: Cigarettes  . Smokeless tobacco: Never Used  . Tobacco comment: smoked some as a teenager (high school) not even a pack a week  Substance Use Topics  . Alcohol use: No    Alcohol/week: 0.0 oz    Comment: 09/15/2012 "drank a little when I was young"  . Drug use: No    Family History  Problem Relation Age of Onset  . Lung cancer Mother   . Anemia Mother   . Polycystic kidney disease Mother   . Diabetes Father   . Heart attack Father 59       Died suddenly  . Heart failure Father   . Hyperlipidemia Father   . Hypertension Father   . Kidney failure Sister 5       Died  . Heart attack Sister   . Hypertension Sister   . Diabetes Sister   . Diabetes Brother   . Polycystic kidney disease Brother   . Diabetes Sister     Review of Systems: A 12-system review of systems was performed and was negative except as noted in the HPI.  --------------------------------------------------------------------------------------------------  Physical Exam: BP 136/80   Pulse 75   Ht 5' 5.5" (1.664 m)   Wt 246 lb (111.6 kg)   BMI 40.31 kg/m   General:  NAD HEENT: No conjunctival pallor or scleral icterus. Moist mucous membranes.  OP clear. Neck: Supple without lymphadenopathy, thyromegaly, JVD, or HJR, though evaluation is limited by body habitus. Lungs: Normal work of breathing. Clear to auscultation bilaterally without wheezes or crackles. Heart: Regular rate and rhythm without murmurs, rubs, or gallops. Unable to assess  PMI due to body habitus. Abd: Bowel sounds present. Obese, firm abdomen without tenderness.  Unable to assess HSM due to body habitus. Ext: No lower extremity edema. Skin: Warm and dry without rash.  EKG:  NSR with left axis deviation, LVH, poor R-wave progression, and inferior Q-waves.  No significant change since prior tracing on 10/15/17.  Lab Results  Component Value Date   WBC 14.4 (H) 10/16/2017   HGB 9.8 (L) 10/16/2017   HCT 30.3 (L) 10/16/2017   MCV 86.6 10/16/2017   PLT 231 10/16/2017    Lab Results  Component Value Date   NA 134 (L) 10/18/2017   K 3.7 10/18/2017   CL 100 (L) 10/18/2017   CO2 20 (L) 10/18/2017   BUN 45 (H) 10/18/2017  CREATININE 3.95 (H) 10/18/2017   GLUCOSE 98 10/18/2017   ALT 17 10/15/2017    Lab Results  Component Value Date   CHOL 161 10/16/2017   HDL 41 10/16/2017   LDLCALC 80 10/16/2017   LDLDIRECT 72.0 11/03/2014   TRIG 201 (H) 10/16/2017   CHOLHDL 3.9 10/16/2017    --------------------------------------------------------------------------------------------------  ASSESSMENT AND PLAN: Coronary artery disease with stable angina No significant chest pain since prior visit.  Continue antianginal therapy consisting of carvedilol, amlodipine, isosorbide mononitrate, and ranolazine, as well as indefinite DAPT with aspirin and clopidogrel.  We will defer catheterization as long as symptoms are stable, given advanced CKD.  Chronic diastolic heart failure Recent weight gain and edema reported by Mr. Chandran.  His weight is up 15 pounds from his baseline in December and February.  Furosemide was [facility] by the [facility] with improvement per the patient.  He should continue his current furosemide regimen [facility] (nephrology) as previously discussed.  I will request records of his recent echo and labs at the [place].  Chronic kidney disease stage 4 and anemia Mr. Parenteau reports stable creatinine with labs checked as  recently as last week at the [place].  We will request the results.  Dr. Justin Mend has also requested that we check iron studies in anticipation of iron infusions and/or erythropoietin treatment.  Mr. Fullen is agreeable to having ferritin and TIBC checked today.  Hypertension Blood pressure mildly elevated today.  Hopefully, this will improve with treatment of volume overload on increased dose of furosemide.  No medication changes today.  Hyperlipidemia Continue high-intensity statin therapy and lifestyle modifications, as LDL was just above goal on last check in February.  Follow-up: Return to clinic in 3 months.  Nelva Bush, MD 02/12/2018 4:33 PM

## 2018-03-06 DIAGNOSIS — Z794 Long term (current) use of insulin: Secondary | ICD-10-CM | POA: Diagnosis not present

## 2018-03-06 DIAGNOSIS — E785 Hyperlipidemia, unspecified: Secondary | ICD-10-CM | POA: Diagnosis not present

## 2018-03-06 DIAGNOSIS — N184 Chronic kidney disease, stage 4 (severe): Secondary | ICD-10-CM | POA: Diagnosis not present

## 2018-03-06 DIAGNOSIS — I10 Essential (primary) hypertension: Secondary | ICD-10-CM | POA: Diagnosis not present

## 2018-03-06 DIAGNOSIS — R972 Elevated prostate specific antigen [PSA]: Secondary | ICD-10-CM | POA: Diagnosis not present

## 2018-03-06 DIAGNOSIS — E1159 Type 2 diabetes mellitus with other circulatory complications: Secondary | ICD-10-CM | POA: Diagnosis not present

## 2018-03-06 DIAGNOSIS — E1169 Type 2 diabetes mellitus with other specified complication: Secondary | ICD-10-CM | POA: Diagnosis not present

## 2018-03-06 DIAGNOSIS — E1122 Type 2 diabetes mellitus with diabetic chronic kidney disease: Secondary | ICD-10-CM | POA: Diagnosis not present

## 2018-03-19 ENCOUNTER — Other Ambulatory Visit (HOSPITAL_COMMUNITY): Payer: Self-pay | Admitting: *Deleted

## 2018-03-20 ENCOUNTER — Ambulatory Visit (HOSPITAL_COMMUNITY)
Admission: RE | Admit: 2018-03-20 | Discharge: 2018-03-20 | Disposition: A | Discharge status: Home / Self Care | Payer: Medicare HMO | Source: Ambulatory Visit | Attending: Nephrology | Admitting: Nephrology

## 2018-03-20 DIAGNOSIS — D631 Anemia in chronic kidney disease: Secondary | ICD-10-CM | POA: Diagnosis not present

## 2018-03-20 DIAGNOSIS — N189 Chronic kidney disease, unspecified: Secondary | ICD-10-CM | POA: Diagnosis not present

## 2018-03-20 MED ORDER — FERUMOXYTOL INJECTION 510 MG/17 ML
510.0000 mg | INTRAVENOUS | Status: DC
  Administered 2018-03-20: 13:00:00 510 mg via INTRAVENOUS
  Filled 2018-03-20: qty 17

## 2018-03-20 NOTE — Discharge Instructions (Signed)

## 2018-03-25 DIAGNOSIS — E785 Hyperlipidemia, unspecified: Secondary | ICD-10-CM | POA: Diagnosis not present

## 2018-03-25 DIAGNOSIS — Q613 Polycystic kidney, unspecified: Secondary | ICD-10-CM | POA: Diagnosis not present

## 2018-03-25 DIAGNOSIS — N184 Chronic kidney disease, stage 4 (severe): Secondary | ICD-10-CM | POA: Diagnosis not present

## 2018-03-25 DIAGNOSIS — E1122 Type 2 diabetes mellitus with diabetic chronic kidney disease: Secondary | ICD-10-CM | POA: Diagnosis not present

## 2018-03-25 DIAGNOSIS — I129 Hypertensive chronic kidney disease with stage 1 through stage 4 chronic kidney disease, or unspecified chronic kidney disease: Secondary | ICD-10-CM | POA: Diagnosis not present

## 2018-03-25 DIAGNOSIS — D631 Anemia in chronic kidney disease: Secondary | ICD-10-CM | POA: Diagnosis not present

## 2018-03-25 DIAGNOSIS — I2510 Atherosclerotic heart disease of native coronary artery without angina pectoris: Secondary | ICD-10-CM | POA: Diagnosis not present

## 2018-03-25 DIAGNOSIS — N2581 Secondary hyperparathyroidism of renal origin: Secondary | ICD-10-CM | POA: Diagnosis not present

## 2018-03-25 DIAGNOSIS — I5030 Unspecified diastolic (congestive) heart failure: Secondary | ICD-10-CM | POA: Diagnosis not present

## 2018-03-27 ENCOUNTER — Ambulatory Visit (HOSPITAL_COMMUNITY)
Admission: RE | Admit: 2018-03-27 | Discharge: 2018-03-27 | Disposition: A | Discharge status: Home / Self Care | Payer: Medicare HMO | Source: Ambulatory Visit | Attending: Nephrology | Admitting: Nephrology

## 2018-03-27 DIAGNOSIS — D631 Anemia in chronic kidney disease: Secondary | ICD-10-CM | POA: Diagnosis not present

## 2018-03-27 DIAGNOSIS — N189 Chronic kidney disease, unspecified: Secondary | ICD-10-CM | POA: Diagnosis not present

## 2018-03-27 MED ORDER — SODIUM CHLORIDE 0.9 % IV SOLN
510.0000 mg | INTRAVENOUS | Status: AC
  Administered 2018-03-27: 12:00:00 510 mg via INTRAVENOUS
  Filled 2018-03-27: qty 17

## 2018-03-31 ENCOUNTER — Other Ambulatory Visit: Payer: Self-pay

## 2018-03-31 DIAGNOSIS — Z01812 Encounter for preprocedural laboratory examination: Secondary | ICD-10-CM

## 2018-03-31 DIAGNOSIS — N184 Chronic kidney disease, stage 4 (severe): Secondary | ICD-10-CM

## 2018-04-28 DIAGNOSIS — Z794 Long term (current) use of insulin: Secondary | ICD-10-CM | POA: Diagnosis not present

## 2018-04-28 DIAGNOSIS — E785 Hyperlipidemia, unspecified: Secondary | ICD-10-CM | POA: Diagnosis not present

## 2018-04-28 DIAGNOSIS — N184 Chronic kidney disease, stage 4 (severe): Secondary | ICD-10-CM | POA: Diagnosis not present

## 2018-04-28 DIAGNOSIS — I2510 Atherosclerotic heart disease of native coronary artery without angina pectoris: Secondary | ICD-10-CM | POA: Diagnosis not present

## 2018-04-28 DIAGNOSIS — E1159 Type 2 diabetes mellitus with other circulatory complications: Secondary | ICD-10-CM | POA: Diagnosis not present

## 2018-04-28 DIAGNOSIS — E1169 Type 2 diabetes mellitus with other specified complication: Secondary | ICD-10-CM | POA: Diagnosis not present

## 2018-04-28 DIAGNOSIS — I10 Essential (primary) hypertension: Secondary | ICD-10-CM | POA: Diagnosis not present

## 2018-04-28 DIAGNOSIS — E1122 Type 2 diabetes mellitus with diabetic chronic kidney disease: Secondary | ICD-10-CM | POA: Diagnosis not present

## 2018-05-19 ENCOUNTER — Other Ambulatory Visit: Payer: Self-pay | Admitting: *Deleted

## 2018-05-19 ENCOUNTER — Ambulatory Visit (HOSPITAL_COMMUNITY)
Admission: RE | Admit: 2018-05-19 | Discharge: 2018-05-19 | Disposition: A | Discharge status: Home / Self Care | Payer: Medicare HMO | Source: Ambulatory Visit | Attending: Surgery | Admitting: Surgery

## 2018-05-19 ENCOUNTER — Ambulatory Visit (INDEPENDENT_AMBULATORY_CARE_PROVIDER_SITE_OTHER)
Admission: RE | Admit: 2018-05-19 | Discharge: 2018-05-19 | Disposition: A | Discharge status: Home / Self Care | Payer: Medicare HMO | Source: Ambulatory Visit | Attending: Surgery | Admitting: Surgery

## 2018-05-19 ENCOUNTER — Encounter: Payer: Self-pay | Admitting: Surgery

## 2018-05-19 ENCOUNTER — Ambulatory Visit (INDEPENDENT_AMBULATORY_CARE_PROVIDER_SITE_OTHER): Payer: Medicare HMO | Admitting: Surgery

## 2018-05-19 ENCOUNTER — Other Ambulatory Visit: Payer: Self-pay

## 2018-05-19 ENCOUNTER — Encounter: Payer: Self-pay | Admitting: *Deleted

## 2018-05-19 VITALS — BP 149/83 | HR 86 | Temp 97.3°F | Resp 18 | Ht 65.5 in | Wt 236.0 lb

## 2018-05-19 DIAGNOSIS — E785 Hyperlipidemia, unspecified: Secondary | ICD-10-CM | POA: Insufficient documentation

## 2018-05-19 DIAGNOSIS — Z01812 Encounter for preprocedural laboratory examination: Secondary | ICD-10-CM

## 2018-05-19 DIAGNOSIS — Z01810 Encounter for preprocedural cardiovascular examination: Secondary | ICD-10-CM | POA: Insufficient documentation

## 2018-05-19 DIAGNOSIS — I120 Hypertensive chronic kidney disease with stage 5 chronic kidney disease or end stage renal disease: Secondary | ICD-10-CM | POA: Insufficient documentation

## 2018-05-19 DIAGNOSIS — I2510 Atherosclerotic heart disease of native coronary artery without angina pectoris: Secondary | ICD-10-CM | POA: Diagnosis not present

## 2018-05-19 DIAGNOSIS — N185 Chronic kidney disease, stage 5: Secondary | ICD-10-CM | POA: Insufficient documentation

## 2018-05-19 DIAGNOSIS — E1122 Type 2 diabetes mellitus with diabetic chronic kidney disease: Secondary | ICD-10-CM | POA: Insufficient documentation

## 2018-05-19 DIAGNOSIS — N184 Chronic kidney disease, stage 4 (severe): Secondary | ICD-10-CM

## 2018-05-19 NOTE — Progress Notes (Signed)
[facility] [place]  Patient name: Matthew GOEKEN Sr. MRN: [id number] DOB: [birth year] Sex: male   REQUESTING PROVIDER:    Dr. Justin Mend   REASON FOR CONSULT:    Dialysis access  HISTORY OF PRESENT ILLNESS:   Matthew Rowe. is a 57 y.o. male, who is referred today for dialysis access.  His renal failure is secondary to diabetes and hypertension.  He is right handed.  Patient has a history of coronary artery disease.  He is status post CABG.  He is on dual antiplatelet therapy.  He is a former smoker.  He is medically managed for hypertension.  He takes a statin for hypercholesterolemia.  He does not have a pacemaker  PAST MEDICAL HISTORY    Past Medical History:  Diagnosis Date  . Acute on chronic combined systolic and diastolic CHF (congestive heart failure) ([place]) 10/15/2017  . Chronic anemia   . Chronic combined systolic and diastolic CHF (congestive heart failure) ([place])   . Chronic stable angina ([place])   . CKD (chronic kidney disease), stage IV ([place])   . Coronary artery disease    a. INF MI 1999: PCI of RCA;  b.  extensive stenting of LAD;  c. s/p CABG with RIMA->RCA in 2006;  d. Stable, Low risk MV 05/2012; e. 09/2012 Cath: stable anatomy->med Rx. f. Abnl nuc 11/2013 -> med rx; g. 11/2013 Cath/PCI: s/p DES to RCA. h. Abnormal nuc 11/2016, pt declined cath.   . DM2 (diabetes mellitus, type 2) ([place])   . Dyslipidemia   . GERD (gastroesophageal reflux disease)   . History of gout   . Hypertensive heart disease   . Ischemic cardiomyopathy   . Mild aortic stenosis   . Myocardial infarction [facility]) 1999; 2002; 2003; 2006; ~ 2008  . OSA on CPAP   . Polycystic kidney disease      FAMILY HISTORY   Family History  Problem Relation Age of Onset  . Lung cancer Mother   . Anemia Mother   . Polycystic kidney disease Mother   . Diabetes Father   . Heart attack Father 85       Died suddenly  . Heart failure Father   .  Hyperlipidemia Father   . Hypertension Father   . Kidney failure Sister 5       Died  . Heart attack Sister   . Hypertension Sister   . Diabetes Sister   . Diabetes Brother   . Polycystic kidney disease Brother   . Diabetes Sister     SOCIAL HISTORY:   Social History   Socioeconomic History  . Marital status: Married    Spouse name: [name]  . Number of children: 5  . Years of education: Not on file  . Highest education level: Not on file  Occupational History  . Occupation: retired    Comment: Chartered loss adjuster  Social Needs  . Financial resource strain: Not on file  . Food insecurity:    Worry: Not on file    Inability: Not on file  . Transportation needs:    Medical: Not on file    Non-medical: Not on file  Tobacco Use  . Smoking status: Former Smoker    Types: Cigarettes  . Smokeless tobacco: Never Used  . Tobacco comment: smoked some as a teenager (high school) not even a pack a week  Substance and Sexual Activity  . Alcohol use: No    Alcohol/week: 0.0 standard drinks    Comment:  09/15/2012 "drank a little when I was young"  . Drug use: No  . Sexual activity: Yes  Lifestyle  . Physical activity:    Days per week: Not on file    Minutes per session: Not on file  . Stress: Not on file  Relationships  . Social connections:    Talks on phone: Not on file    Gets together: Not on file    Attends religious service: Not on file    Active member of club or organization: Not on file    Attends meetings of clubs or organizations: Not on file    Relationship status: Not on file  . Intimate partner violence:    Fear of current or ex partner: Not on file    Emotionally abused: Not on file    Physically abused: Not on file    Forced sexual activity: Not on file  Other Topics Concern  . Not on file  Social History Narrative   Married and lives with wife in [place]. On disability.    Consumes about 2 Coke Zeros a day     ALLERGIES:    Allergies  Allergen  Reactions  . Ciprocin-Fluocin-Procin [Fluocinolone Acetonide] Rash  . Clarithromycin Itching and Other (See Comments)    "Biaxin" Eyes itch and burn  . Cleocin [Clindamycin Hcl] Anaphylaxis and Swelling  . Glimepiride [Amaryl] Other (See Comments)    Elevates liver function   . Colchicine Other (See Comments)    Affected kidneys Affected kidneys Affected kidneys    CURRENT MEDICATIONS:    Current Outpatient Medications  Medication Sig Dispense Refill  . acetaminophen (TYLENOL) 500 MG tablet Take 500 mg by mouth 3 (three) times daily as needed (for pain or headaches).     Marland Kitchen allopurinol (ZYLOPRIM) 100 MG tablet Take 100 mg by mouth every morning.    Marland Kitchen amLODipine (NORVASC) 10 MG tablet Take 1 tablet (10 mg total) by mouth daily. 90 tablet 3  . aspirin EC 81 MG EC tablet Take 1 tablet (81 mg total) by mouth daily.    . carvedilol (COREG) 25 MG tablet Take 2 tablets (50 mg total) by mouth 2 (two) times daily. 360 tablet 1  . Cholecalciferol 2000 units TABS Take 2,000 Units by mouth every morning.    . clopidogrel (PLAVIX) 75 MG tablet TAKE ONE TABLET BY MOUTH ONCE DAILY WITH BREAKFAST (Patient taking differently: Take 75 mg by mouth once a day with breakfast) 90 tablet 1  . diphenhydrAMINE (BENADRYL) 25 MG tablet Take 25 mg by mouth every 6 (six) hours as needed for allergies or sleep.     . furosemide (LASIX) 40 MG tablet Take 1 tablet (40 mg total) by mouth 2 (two) times daily. 180 tablet 2  . gabapentin (NEURONTIN) 300 MG capsule Take 1 capsule (300 mg total) by mouth 2 (two) times daily. (Patient taking differently: Take 300 mg by mouth 3 (three) times daily. ) 60 capsule 0  . hydrALAZINE (APRESOLINE) 50 MG tablet Take 1 tablet (50 mg total) by mouth 3 (three) times daily. 270 tablet 3  . Insulin NPH Isophane & Regular (NOVOLIN 70/30 [place]) Inject 20 Units into the skin.     Marland Kitchen isosorbide mononitrate (IMDUR) 120 MG 24 hr tablet Take 1 tablet (120 mg total) by mouth 2 (two) times daily.      . Misc. Devices MISC by Does not apply route. C-PAP    . Multiple Vitamin (MULTIVITAMIN WITH MINERALS) TABS tablet Take 1 tablet by mouth  daily.    . nitroGLYCERIN (NITROSTAT) 0.4 MG SL tablet Place 1 tablet (0.4 mg total) under the tongue every 5 (five) minutes as needed for chest pain. 25 tablet 5  . pantoprazole (PROTONIX) 40 MG tablet Take 40 mg by mouth daily.     . ranitidine (ZANTAC) 300 MG tablet Take 300 mg by mouth at bedtime.    . ranolazine (RANEXA) 500 MG 12 hr tablet Take 1 tablet (500 mg total) by mouth 2 (two) times daily. 180 tablet 3  . Semaglutide (OZEMPIC ) Inject 2.5 Units into the skin once a week.    . traMADol (ULTRAM) 50 MG tablet Take 1 tablet (50 mg total) by mouth every 6 (six) hours as needed. 12 tablet 0  . vitamin E 400 UNIT capsule Take 400 Units by mouth every evening.     . zolpidem (AMBIEN) 10 MG tablet Take 10 mg by mouth at bedtime as needed.    Marland Kitchen atorvastatin (LIPITOR) 80 MG tablet Take 1 tablet (80 mg total) by mouth daily. 90 tablet 1  . carvedilol (COREG) 25 MG tablet Take 2 tablets (50 mg total) by mouth 2 (two) times daily for 7 days. 28 tablet 1  . Omega-3 Fatty Acids (FISH OIL) 1000 MG CAPS Take 2,000 mg by mouth 2 (two) times daily.     No current facility-administered medications for this visit.     REVIEW OF SYSTEMS:   [X]  denotes positive finding, [ ]  denotes negative finding Cardiac  Comments:  Chest pain or chest pressure: x   Shortness of breath upon exertion: x   Short of breath when lying flat: x   Irregular heart rhythm:        Vascular    Pain in calf, thigh, or hip brought on by ambulation:    Pain in feet at night that wakes you up from your sleep:     Blood clot in your veins:    Leg swelling:  x       Pulmonary    Oxygen at home:    Productive cough:     Wheezing:         Neurologic    Sudden weakness in arms or legs:     Sudden numbness in arms or legs:     Sudden onset of difficulty speaking or slurred speech:     Temporary loss of vision in one eye:     Problems with dizziness:         Gastrointestinal    Blood in stool:      Vomited blood:         Genitourinary    Burning when urinating:     Blood in urine:        Psychiatric    Major depression:         Hematologic    Bleeding problems:    Problems with blood clotting too easily:        Skin    Rashes or ulcers:        Constitutional    Fever or chills:     PHYSICAL EXAM:   Vitals:   05/19/18 0855 05/19/18 0901  BP: (!) 147/87 (!) 149/83  Pulse: 85 86  Resp: 18   Temp: (!) 97.3 F (36.3 C)   TempSrc: Oral   SpO2: 100%   Weight: 236 lb (107 kg)   Height: 5' 5.5" (1.664 m)     GENERAL: The patient is a well-nourished male, in no acute  distress. The vital signs are documented above. CARDIAC: There is a regular rate and rhythm.  VASCULAR: palpable left radial pulse PULMONARY: Nonlabored respirations ABDOMEN: Soft and non-tender with normal pitched bowel sounds.  MUSCULOSKELETAL: There are no major deformities or cyanosis. NEUROLOGIC: No focal weakness or paresthesias are detected. SKIN: There are no ulcers or rashes noted. PSYCHIATRIC: The patient has a normal affect.  STUDIES:   I have ordered and reviewed his vein mapping.  He has a marginal left basilic vein.  The cephalic vein is not adequate. Arterial evaluation shows triphasic waveforms bilaterally  ASSESSMENT and PLAN   CKD 5: We have been asked to place access in this right-handed patient.  I evaluated his basilic vein with the sono site ultrasound and felt that it was marginal.  I discussed with the patient and his wife that I would evaluate the vein in the operating room and either proceed with a first stage basilic vein fistula or, more likely a left arm graft.  This is been scheduled for Thursday, September 19.  He will stop his Plavix 5 days prior to the procedure.  Details of the procedure as well as risks and benefits were discussed with the  patient.   Annamarie Major, MD [facility] [phone] Pager [phone]

## 2018-05-19 NOTE — H&P (View-Only) (Signed)
Vascular and Vein Specialist of [place]  Patient name: Matthew Rowe. MRN: [id number] DOB: 16-Nov-1960 Sex: male   REQUESTING PROVIDER:    Dr. Justin Mend   REASON FOR CONSULT:    Dialysis access  HISTORY OF PRESENT ILLNESS:   Matthew Rowe. is a 57 y.o. male, who is referred today for dialysis access.  His renal failure is secondary to diabetes and hypertension.  He is right handed.  Patient has a history of coronary artery disease.  He is status post CABG.  He is on dual antiplatelet therapy.  He is a former smoker.  He is medically managed for hypertension.  He takes a statin for hypercholesterolemia.  He does not have a pacemaker  PAST MEDICAL HISTORY    Past Medical History:  Diagnosis Date  . Acute on chronic combined systolic and diastolic CHF (congestive heart failure) ([place]) 10/15/2017  . Chronic anemia   . Chronic combined systolic and diastolic CHF (congestive heart failure) ([place])   . Chronic stable angina ([place])   . CKD (chronic kidney disease), stage IV ([place])   . Coronary artery disease    a. INF MI 1999: PCI of RCA;  b.  extensive stenting of LAD;  c. s/p CABG with RIMA->RCA in 2006;  d. Stable, Low risk MV 05/2012; e. 09/2012 Cath: stable anatomy->med Rx. f. Abnl nuc 11/2013 -> med rx; g. 11/2013 Cath/PCI: s/p DES to RCA. h. Abnormal nuc 11/2016, pt declined cath.   . DM2 (diabetes mellitus, type 2) ([place])   . Dyslipidemia   . GERD (gastroesophageal reflux disease)   . History of gout   . Hypertensive heart disease   . Ischemic cardiomyopathy   . Mild aortic stenosis   . Myocardial infarction [facility]) 1999; 2002; 2003; 2006; ~ 2008  . OSA on CPAP   . Polycystic kidney disease      FAMILY HISTORY   Family History  Problem Relation Age of Onset  . Lung cancer Mother   . Anemia Mother   . Polycystic kidney disease Mother   . Diabetes Father   . Heart attack Father 83       Died suddenly  . Heart failure Father   .  Hyperlipidemia Father   . Hypertension Father   . Kidney failure Sister 5       Died  . Heart attack Sister   . Hypertension Sister   . Diabetes Sister   . Diabetes Brother   . Polycystic kidney disease Brother   . Diabetes Sister     SOCIAL HISTORY:   Social History   Socioeconomic History  . Marital status: Married    Spouse name: [name]  . Number of children: 5  . Years of education: Not on file  . Highest education level: Not on file  Occupational History  . Occupation: retired    Comment: Chartered loss adjuster  Social Needs  . Financial resource strain: Not on file  . Food insecurity:    Worry: Not on file    Inability: Not on file  . Transportation needs:    Medical: Not on file    Non-medical: Not on file  Tobacco Use  . Smoking status: Former Smoker    Types: Cigarettes  . Smokeless tobacco: Never Used  . Tobacco comment: smoked some as a teenager (high school) not even a pack a week  Substance and Sexual Activity  . Alcohol use: No    Alcohol/week: 0.0 standard drinks    Comment:  09/15/2012 "drank a little when I was young"  . Drug use: No  . Sexual activity: Yes  Lifestyle  . Physical activity:    Days per week: Not on file    Minutes per session: Not on file  . Stress: Not on file  Relationships  . Social connections:    Talks on phone: Not on file    Gets together: Not on file    Attends religious service: Not on file    Active member of club or organization: Not on file    Attends meetings of clubs or organizations: Not on file    Relationship status: Not on file  . Intimate partner violence:    Fear of current or ex partner: Not on file    Emotionally abused: Not on file    Physically abused: Not on file    Forced sexual activity: Not on file  Other Topics Concern  . Not on file  Social History Narrative   Married and lives with wife in [place]. On disability.    Consumes about 2 Coke Zeros a day     ALLERGIES:    Allergies  Allergen  Reactions  . Ciprocin-Fluocin-Procin [Fluocinolone Acetonide] Rash  . Clarithromycin Itching and Other (See Comments)    "Biaxin" Eyes itch and burn  . Cleocin [Clindamycin Hcl] Anaphylaxis and Swelling  . Glimepiride [Amaryl] Other (See Comments)    Elevates liver function   . Colchicine Other (See Comments)    Affected kidneys Affected kidneys Affected kidneys    CURRENT MEDICATIONS:    Current Outpatient Medications  Medication Sig Dispense Refill  . acetaminophen (TYLENOL) 500 MG tablet Take 500 mg by mouth 3 (three) times daily as needed (for pain or headaches).     Marland Kitchen allopurinol (ZYLOPRIM) 100 MG tablet Take 100 mg by mouth every morning.    Marland Kitchen amLODipine (NORVASC) 10 MG tablet Take 1 tablet (10 mg total) by mouth daily. 90 tablet 3  . aspirin EC 81 MG EC tablet Take 1 tablet (81 mg total) by mouth daily.    . carvedilol (COREG) 25 MG tablet Take 2 tablets (50 mg total) by mouth 2 (two) times daily. 360 tablet 1  . Cholecalciferol 2000 units TABS Take 2,000 Units by mouth every morning.    . clopidogrel (PLAVIX) 75 MG tablet TAKE ONE TABLET BY MOUTH ONCE DAILY WITH BREAKFAST (Patient taking differently: Take 75 mg by mouth once a day with breakfast) 90 tablet 1  . diphenhydrAMINE (BENADRYL) 25 MG tablet Take 25 mg by mouth every 6 (six) hours as needed for allergies or sleep.     . furosemide (LASIX) 40 MG tablet Take 1 tablet (40 mg total) by mouth 2 (two) times daily. 180 tablet 2  . gabapentin (NEURONTIN) 300 MG capsule Take 1 capsule (300 mg total) by mouth 2 (two) times daily. (Patient taking differently: Take 300 mg by mouth 3 (three) times daily. ) 60 capsule 0  . hydrALAZINE (APRESOLINE) 50 MG tablet Take 1 tablet (50 mg total) by mouth 3 (three) times daily. 270 tablet 3  . Insulin NPH Isophane & Regular (NOVOLIN 70/30 [place]) Inject 20 Units into the skin.     Marland Kitchen isosorbide mononitrate (IMDUR) 120 MG 24 hr tablet Take 1 tablet (120 mg total) by mouth 2 (two) times daily.      . Misc. Devices MISC by Does not apply route. C-PAP    . Multiple Vitamin (MULTIVITAMIN WITH MINERALS) TABS tablet Take 1 tablet by mouth  daily.    . nitroGLYCERIN (NITROSTAT) 0.4 MG SL tablet Place 1 tablet (0.4 mg total) under the tongue every 5 (five) minutes as needed for chest pain. 25 tablet 5  . pantoprazole (PROTONIX) 40 MG tablet Take 40 mg by mouth daily.     . ranitidine (ZANTAC) 300 MG tablet Take 300 mg by mouth at bedtime.    . ranolazine (RANEXA) 500 MG 12 hr tablet Take 1 tablet (500 mg total) by mouth 2 (two) times daily. 180 tablet 3  . Semaglutide (OZEMPIC [place]) Inject 2.5 Units into the skin once a week.    . traMADol (ULTRAM) 50 MG tablet Take 1 tablet (50 mg total) by mouth every 6 (six) hours as needed. 12 tablet 0  . vitamin E 400 UNIT capsule Take 400 Units by mouth every evening.     . zolpidem (AMBIEN) 10 MG tablet Take 10 mg by mouth at bedtime as needed.    Marland Kitchen atorvastatin (LIPITOR) 80 MG tablet Take 1 tablet (80 mg total) by mouth daily. 90 tablet 1  . carvedilol (COREG) 25 MG tablet Take 2 tablets (50 mg total) by mouth 2 (two) times daily for 7 days. 28 tablet 1  . Omega-3 Fatty Acids (FISH OIL) 1000 MG CAPS Take 2,000 mg by mouth 2 (two) times daily.     No current facility-administered medications for this visit.     REVIEW OF SYSTEMS:   [X]  denotes positive finding, [ ]  denotes negative finding Cardiac  Comments:  Chest pain or chest pressure: x   Shortness of breath upon exertion: x   Short of breath when lying flat: x   Irregular heart rhythm:        Vascular    Pain in calf, thigh, or hip brought on by ambulation:    Pain in feet at night that wakes you up from your sleep:     Blood clot in your veins:    Leg swelling:  x       Pulmonary    Oxygen at home:    Productive cough:     Wheezing:         Neurologic    Sudden weakness in arms or legs:     Sudden numbness in arms or legs:     Sudden onset of difficulty speaking or slurred speech:     Temporary loss of vision in one eye:     Problems with dizziness:         Gastrointestinal    Blood in stool:      Vomited blood:         Genitourinary    Burning when urinating:     Blood in urine:        Psychiatric    Major depression:         Hematologic    Bleeding problems:    Problems with blood clotting too easily:        Skin    Rashes or ulcers:        Constitutional    Fever or chills:     PHYSICAL EXAM:   Vitals:   05/19/18 0855 05/19/18 0901  BP: (!) 147/87 (!) 149/83  Pulse: 85 86  Resp: 18   Temp: (!) 97.3 F (36.3 C)   TempSrc: Oral   SpO2: 100%   Weight: 236 lb (107 kg)   Height: 5' 5.5" (1.664 m)     GENERAL: The patient is a well-nourished male, in no acute  distress. The vital signs are documented above. CARDIAC: There is a regular rate and rhythm.  VASCULAR: palpable left radial pulse PULMONARY: Nonlabored respirations ABDOMEN: Soft and non-tender with normal pitched bowel sounds.  MUSCULOSKELETAL: There are no major deformities or cyanosis. NEUROLOGIC: No focal weakness or paresthesias are detected. SKIN: There are no ulcers or rashes noted. PSYCHIATRIC: The patient has a normal affect.  STUDIES:   I have ordered and reviewed his vein mapping.  He has a marginal left basilic vein.  The cephalic vein is not adequate. Arterial evaluation shows triphasic waveforms bilaterally  ASSESSMENT and PLAN   CKD 5: We have been asked to place access in this right-handed patient.  I evaluated his basilic vein with the sono site ultrasound and felt that it was marginal.  I discussed with the patient and his wife that I would evaluate the vein in the operating room and either proceed with a first stage basilic vein fistula or, more likely a left arm graft.  This is been scheduled for Thursday, September 19.  He will stop his Plavix 5 days prior to the procedure.  Details of the procedure as well as risks and benefits were discussed with the  patient.   Annamarie Major, MD [facility] of [facility] [phone] Pager [phone]

## 2018-05-20 ENCOUNTER — Encounter: Payer: Self-pay | Admitting: Nephrology

## 2018-05-20 DIAGNOSIS — N185 Chronic kidney disease, stage 5: Secondary | ICD-10-CM | POA: Diagnosis not present

## 2018-05-28 ENCOUNTER — Other Ambulatory Visit: Payer: Self-pay

## 2018-05-28 ENCOUNTER — Encounter (HOSPITAL_COMMUNITY): Payer: Self-pay | Admitting: *Deleted

## 2018-05-28 NOTE — Progress Notes (Signed)
Pt denies SOB and chest pain. Pt stated that he is under the care of Dr. Saunders Revel, Cardiology. Pt stated that his last dose of Plavix was 05/23/18 as instructed. Pt stated " I stopped my Aspirin too since it is a blood thinner."   Spoke with Wells Guiles, RN, at [facility] to make MD aware that pt had stopped taking Asprin and was instructed to take Aspirin (per note in Epic). Pt made aware to stop taking vitamins, fish oil and herbal medications. Do not take any NSAIDs ie: Ibuprofen, Advil, Naproxen (Aleve), Motrin, BC and Goody Powder. Pt stated that he takes 20 units of Novolin 70/30 BID if needed; pt made aware to only take (70%), 14 units at HS (PRN) and no Novolin 70/30 insulin or Ozempic injection on DOS. Pt made aware to check BG every 2 hours prior to arrival to hospital on DOS. Pt made aware to treat a BG < 70 with 4 ounces of apple  juice, wait 15 minutes after intervention to recheck BG, if BG remains < 70, call Short Stay unit to speak with a nurse. Pt verbalized understanding of all pre-op instructions.

## 2018-05-28 NOTE — Progress Notes (Signed)
Anesthesia asked to review pt cardiac history.

## 2018-05-29 ENCOUNTER — Encounter (HOSPITAL_COMMUNITY)
Admission: RE | Disposition: A | Discharge status: Home / Self Care | Payer: Self-pay | Source: Ambulatory Visit | Attending: Surgery

## 2018-05-29 ENCOUNTER — Ambulatory Visit (HOSPITAL_COMMUNITY): Payer: Medicare HMO | Admitting: Physician Assistant

## 2018-05-29 ENCOUNTER — Ambulatory Visit (HOSPITAL_COMMUNITY)
Admission: RE | Admit: 2018-05-29 | Discharge: 2018-05-29 | Disposition: A | Discharge status: Home / Self Care | Payer: Medicare HMO | Source: Ambulatory Visit | Attending: Surgery | Admitting: Surgery

## 2018-05-29 ENCOUNTER — Other Ambulatory Visit: Payer: Self-pay

## 2018-05-29 ENCOUNTER — Telehealth: Payer: Self-pay | Admitting: Surgery

## 2018-05-29 ENCOUNTER — Encounter (HOSPITAL_COMMUNITY): Payer: Self-pay | Admitting: *Deleted

## 2018-05-29 DIAGNOSIS — I252 Old myocardial infarction: Secondary | ICD-10-CM | POA: Insufficient documentation

## 2018-05-29 DIAGNOSIS — I132 Hypertensive heart and chronic kidney disease with heart failure and with stage 5 chronic kidney disease, or end stage renal disease: Secondary | ICD-10-CM | POA: Diagnosis not present

## 2018-05-29 DIAGNOSIS — G4733 Obstructive sleep apnea (adult) (pediatric): Secondary | ICD-10-CM | POA: Insufficient documentation

## 2018-05-29 DIAGNOSIS — N185 Chronic kidney disease, stage 5: Secondary | ICD-10-CM | POA: Insufficient documentation

## 2018-05-29 DIAGNOSIS — Z955 Presence of coronary angioplasty implant and graft: Secondary | ICD-10-CM | POA: Insufficient documentation

## 2018-05-29 DIAGNOSIS — Z888 Allergy status to other drugs, medicaments and biological substances status: Secondary | ICD-10-CM | POA: Insufficient documentation

## 2018-05-29 DIAGNOSIS — E669 Obesity, unspecified: Secondary | ICD-10-CM | POA: Insufficient documentation

## 2018-05-29 DIAGNOSIS — I5042 Chronic combined systolic (congestive) and diastolic (congestive) heart failure: Secondary | ICD-10-CM | POA: Diagnosis not present

## 2018-05-29 DIAGNOSIS — I255 Ischemic cardiomyopathy: Secondary | ICD-10-CM | POA: Diagnosis not present

## 2018-05-29 DIAGNOSIS — I350 Nonrheumatic aortic (valve) stenosis: Secondary | ICD-10-CM | POA: Insufficient documentation

## 2018-05-29 DIAGNOSIS — E1122 Type 2 diabetes mellitus with diabetic chronic kidney disease: Secondary | ICD-10-CM | POA: Insufficient documentation

## 2018-05-29 DIAGNOSIS — Z881 Allergy status to other antibiotic agents status: Secondary | ICD-10-CM | POA: Diagnosis not present

## 2018-05-29 DIAGNOSIS — E7800 Pure hypercholesterolemia, unspecified: Secondary | ICD-10-CM | POA: Diagnosis not present

## 2018-05-29 DIAGNOSIS — Z8249 Family history of ischemic heart disease and other diseases of the circulatory system: Secondary | ICD-10-CM | POA: Insufficient documentation

## 2018-05-29 DIAGNOSIS — N184 Chronic kidney disease, stage 4 (severe): Secondary | ICD-10-CM | POA: Diagnosis not present

## 2018-05-29 DIAGNOSIS — Z794 Long term (current) use of insulin: Secondary | ICD-10-CM | POA: Diagnosis not present

## 2018-05-29 DIAGNOSIS — I2510 Atherosclerotic heart disease of native coronary artery without angina pectoris: Secondary | ICD-10-CM | POA: Insufficient documentation

## 2018-05-29 DIAGNOSIS — Z951 Presence of aortocoronary bypass graft: Secondary | ICD-10-CM | POA: Diagnosis not present

## 2018-05-29 DIAGNOSIS — Z7982 Long term (current) use of aspirin: Secondary | ICD-10-CM | POA: Diagnosis not present

## 2018-05-29 DIAGNOSIS — K219 Gastro-esophageal reflux disease without esophagitis: Secondary | ICD-10-CM | POA: Diagnosis not present

## 2018-05-29 DIAGNOSIS — Z79899 Other long term (current) drug therapy: Secondary | ICD-10-CM | POA: Insufficient documentation

## 2018-05-29 DIAGNOSIS — Z6838 Body mass index (BMI) 38.0-38.9, adult: Secondary | ICD-10-CM | POA: Diagnosis not present

## 2018-05-29 DIAGNOSIS — Z87891 Personal history of nicotine dependence: Secondary | ICD-10-CM | POA: Diagnosis not present

## 2018-05-29 DIAGNOSIS — I130 Hypertensive heart and chronic kidney disease with heart failure and stage 1 through stage 4 chronic kidney disease, or unspecified chronic kidney disease: Secondary | ICD-10-CM | POA: Diagnosis not present

## 2018-05-29 DIAGNOSIS — I5032 Chronic diastolic (congestive) heart failure: Secondary | ICD-10-CM | POA: Diagnosis not present

## 2018-05-29 DIAGNOSIS — Z9989 Dependence on other enabling machines and devices: Secondary | ICD-10-CM | POA: Insufficient documentation

## 2018-05-29 DIAGNOSIS — Z7902 Long term (current) use of antithrombotics/antiplatelets: Secondary | ICD-10-CM | POA: Insufficient documentation

## 2018-05-29 HISTORY — PX: AV FISTULA PLACEMENT: SHX1204

## 2018-05-29 LAB — GLUCOSE, CAPILLARY
Glucose-Capillary: 126 mg/dL — ABNORMAL HIGH (ref 70–99)
Glucose-Capillary: 96 mg/dL (ref 70–99)

## 2018-05-29 LAB — HEMOGLOBIN A1C
HEMOGLOBIN A1C: 7 % — AB (ref 4.8–5.6)
MEAN PLASMA GLUCOSE: 154.2 mg/dL

## 2018-05-29 LAB — POCT I-STAT 4, (NA,K, GLUC, HGB,HCT)
Glucose, Bld: 127 mg/dL — ABNORMAL HIGH (ref 70–99)
HCT: 32 % — ABNORMAL LOW (ref 39.0–52.0)
Hemoglobin: 10.9 g/dL — ABNORMAL LOW (ref 13.0–17.0)
POTASSIUM: 3.8 mmol/L (ref 3.5–5.1)
Sodium: 139 mmol/L (ref 135–145)

## 2018-05-29 SURGERY — ARTERIOVENOUS (AV) FISTULA CREATION
Anesthesia: General | Site: Arm Lower | Laterality: Left

## 2018-05-29 MED ORDER — METOPROLOL TARTRATE 5 MG/5ML IV SOLN
2.0000 mg | Freq: Once | INTRAVENOUS | Status: AC
  Administered 2018-05-29: 2 mg via INTRAVENOUS

## 2018-05-29 MED ORDER — CHLORHEXIDINE GLUCONATE 4 % EX LIQD: 60.0000 mL | Freq: Once | CUTANEOUS | Status: DC

## 2018-05-29 MED ORDER — ONDANSETRON HCL 4 MG/2ML IJ SOLN
INTRAMUSCULAR | Status: DC | PRN
  Administered 2018-05-29: 4 mg via INTRAVENOUS

## 2018-05-29 MED ORDER — PROPOFOL 10 MG/ML IV BOLUS
INTRAVENOUS | Status: DC | PRN
  Administered 2018-05-29: 160 mg via INTRAVENOUS

## 2018-05-29 MED ORDER — OXYCODONE HCL 5 MG PO TABS
5.0000 mg | ORAL_TABLET | Freq: Once | ORAL | Status: AC | PRN
  Administered 2018-05-29: 5 mg via ORAL

## 2018-05-29 MED ORDER — OXYCODONE HCL 5 MG/5ML PO SOLN: 5.0000 mg | Freq: Once | ORAL | Status: AC | PRN

## 2018-05-29 MED ORDER — FENTANYL CITRATE (PF) 250 MCG/5ML IJ SOLN
INTRAMUSCULAR | Status: AC
  Filled 2018-05-29: qty 5

## 2018-05-29 MED ORDER — METOPROLOL TARTRATE 5 MG/5ML IV SOLN
INTRAVENOUS | Status: AC
  Filled 2018-05-29: qty 5

## 2018-05-29 MED ORDER — FENTANYL CITRATE (PF) 100 MCG/2ML IJ SOLN
INTRAMUSCULAR | Status: DC | PRN
  Administered 2018-05-29 (×2): 50 ug via INTRAVENOUS

## 2018-05-29 MED ORDER — METOPROLOL TARTRATE 5 MG/5ML IV SOLN
3.0000 mg | Freq: Once | INTRAVENOUS | Status: AC
  Administered 2018-05-29: 3 mg via INTRAVENOUS

## 2018-05-29 MED ORDER — ONDANSETRON HCL 4 MG/2ML IJ SOLN: 4.0000 mg | Freq: Four times a day (QID) | INTRAMUSCULAR | Status: DC | PRN

## 2018-05-29 MED ORDER — LIDOCAINE 2% (20 MG/ML) 5 ML SYRINGE
INTRAMUSCULAR | Status: DC | PRN
  Administered 2018-05-29: 60 mg via INTRAVENOUS

## 2018-05-29 MED ORDER — MIDAZOLAM HCL 2 MG/2ML IJ SOLN
INTRAMUSCULAR | Status: AC
Start: 2018-05-29 — End: ?
  Filled 2018-05-29: qty 2

## 2018-05-29 MED ORDER — CEFAZOLIN SODIUM-DEXTROSE 2-4 GM/100ML-% IV SOLN
2.0000 g | INTRAVENOUS | Status: AC
  Administered 2018-05-29: 2 g via INTRAVENOUS
  Filled 2018-05-29: qty 100

## 2018-05-29 MED ORDER — 0.9 % SODIUM CHLORIDE (POUR BTL) OPTIME
TOPICAL | Status: DC | PRN
  Administered 2018-05-29: 1000 mL

## 2018-05-29 MED ORDER — FENTANYL CITRATE (PF) 100 MCG/2ML IJ SOLN
25.0000 ug | INTRAMUSCULAR | Status: DC | PRN
  Administered 2018-05-29: 25 ug via INTRAVENOUS

## 2018-05-29 MED ORDER — LIDOCAINE-EPINEPHRINE (PF) 1 %-1:200000 IJ SOLN
INTRAMUSCULAR | Status: AC
  Filled 2018-05-29: qty 30

## 2018-05-29 MED ORDER — SODIUM CHLORIDE 0.9 % IV SOLN
INTRAVENOUS | Status: DC | PRN
  Administered 2018-05-29: 10:00:00

## 2018-05-29 MED ORDER — SODIUM CHLORIDE 0.9 % IV SOLN
INTRAVENOUS | Status: DC
  Administered 2018-05-29 (×2): via INTRAVENOUS

## 2018-05-29 MED ORDER — TRAMADOL HCL 50 MG PO TABS: 50.0000 mg | ORAL_TABLET | Freq: Four times a day (QID) | ORAL | 0 refills | Status: DC | PRN

## 2018-05-29 MED ORDER — HEMOSTATIC AGENTS (NO CHARGE) OPTIME
TOPICAL | Status: DC | PRN
  Administered 2018-05-29: 1 via TOPICAL

## 2018-05-29 MED ORDER — OXYCODONE HCL 5 MG PO TABS
ORAL_TABLET | ORAL | Status: AC
  Filled 2018-05-29: qty 1

## 2018-05-29 MED ORDER — SODIUM CHLORIDE 0.9 % IV SOLN
INTRAVENOUS | Status: AC
  Filled 2018-05-29: qty 1.2

## 2018-05-29 MED ORDER — ONDANSETRON HCL 4 MG/2ML IJ SOLN
INTRAMUSCULAR | Status: AC
  Filled 2018-05-29: qty 2

## 2018-05-29 MED ORDER — FENTANYL CITRATE (PF) 100 MCG/2ML IJ SOLN
INTRAMUSCULAR | Status: AC
  Filled 2018-05-29: qty 2

## 2018-05-29 SURGICAL SUPPLY — 29 items
ARMBAND PINK RESTRICT EXTREMIT (MISCELLANEOUS) ×6 IMPLANT
CANISTER SUCT 3000ML PPV (MISCELLANEOUS) ×3 IMPLANT
CLIP VESOCCLUDE MED 6/CT (CLIP) ×3 IMPLANT
CLIP VESOCCLUDE SM WIDE 6/CT (CLIP) ×3 IMPLANT
COVER PROBE W GEL 5X96 (DRAPES) ×3 IMPLANT
DERMABOND ADVANCED (GAUZE/BANDAGES/DRESSINGS) ×2
DERMABOND ADVANCED .7 DNX12 (GAUZE/BANDAGES/DRESSINGS) ×1 IMPLANT
ELECT REM PT RETURN 9FT ADLT (ELECTROSURGICAL) ×3
ELECTRODE REM PT RTRN 9FT ADLT (ELECTROSURGICAL) ×1 IMPLANT
GLOVE BIOGEL PI IND STRL 7.5 (GLOVE) ×1 IMPLANT
GLOVE BIOGEL PI INDICATOR 7.5 (GLOVE) ×2
GLOVE SURG SS PI 7.5 STRL IVOR (GLOVE) ×3 IMPLANT
GOWN STRL REUS W/ TWL LRG LVL3 (GOWN DISPOSABLE) ×2 IMPLANT
GOWN STRL REUS W/ TWL XL LVL3 (GOWN DISPOSABLE) ×1 IMPLANT
GOWN STRL REUS W/TWL LRG LVL3 (GOWN DISPOSABLE) ×4
GOWN STRL REUS W/TWL XL LVL3 (GOWN DISPOSABLE) ×2
HEMOSTAT SNOW SURGICEL 2X4 (HEMOSTASIS) IMPLANT
KIT BASIN OR (CUSTOM PROCEDURE TRAY) ×3 IMPLANT
KIT TURNOVER KIT B (KITS) ×3 IMPLANT
NS IRRIG 1000ML POUR BTL (IV SOLUTION) ×3 IMPLANT
PACK CV ACCESS (CUSTOM PROCEDURE TRAY) ×3 IMPLANT
PAD ARMBOARD 7.5X6 YLW CONV (MISCELLANEOUS) ×6 IMPLANT
SUT PROLENE 6 0 CC (SUTURE) ×3 IMPLANT
SUT VIC AB 3-0 SH 27 (SUTURE) ×2
SUT VIC AB 3-0 SH 27X BRD (SUTURE) ×1 IMPLANT
SUT VICRYL 4-0 PS2 18IN ABS (SUTURE) ×3 IMPLANT
TOWEL GREEN STERILE (TOWEL DISPOSABLE) ×3 IMPLANT
UNDERPAD 30X30 (UNDERPADS AND DIAPERS) ×3 IMPLANT
WATER STERILE IRR 1000ML POUR (IV SOLUTION) ×3 IMPLANT

## 2018-05-29 NOTE — Anesthesia Procedure Notes (Signed)
Procedure Name: LMA Insertion Date/Time: 05/29/2018 9:27 AM Performed by: Babs Bertin, CRNA Pre-anesthesia Checklist: Patient identified, Emergency Drugs available, Suction available and Patient being monitored Patient Re-evaluated:Patient Re-evaluated prior to induction Oxygen Delivery Method: Circle System Utilized Preoxygenation: Pre-oxygenation with 100% oxygen Induction Type: IV induction Ventilation: Mask ventilation without difficulty LMA: LMA inserted LMA Size: 5.0 Number of attempts: 1 Placement Confirmation: positive ETCO2 Tube secured with: Tape Dental Injury: Teeth and Oropharynx as per pre-operative assessment

## 2018-05-29 NOTE — Op Note (Signed)
    Patient name: Matthew HASEGAWA Sr. MRN: [id number] DOB: 1961-01-10 Sex: male  05/29/2018 Pre-operative Diagnosis: CKD 5 Post-operative diagnosis:  Same Surgeon:  Annamarie Major Assistants:  Laurence Slate Procedure:   1st stage basilic vein fistula Anesthesia:  General Blood Loss:  minimal Specimens:  none  Findings:  89mm basilic vein, 5 mm brachial artery  Indications: The patient comes in today for dialysis access  Procedure:  The patient was identified in the holding area and taken to [place] [facility]  The patient was then placed supine on the table. general anesthesia was administered.  The patient was prepped and draped in the usual sterile fashion.  A time out was called and antibiotics were administered.  Ultrasound was used to evaluate the veins in his left upper arm.  He did appear to have a basilic vein that measured about 3 mm.  A transverse incision was made in the antecubital crease.  I first dissected out the brachial artery.  This was a 5 mm disease-free artery.  I then exposed the basilic vein.  This was a 3 mm vein.  Several side branches were ligated between silk ties.  The vein was then transected distally with a 3-0 silk tie.  It was marked for orientation.  It distended nicely with heparinized saline.  The artery was then occluded with vascular clamps.  A #11 blade was used to make an arteriotomy which was extended longitudinally with Potts scissors.  The vein was then spatulated to fit the size of the arteriotomy and a running anastomosis was created with 6-0 Prolene.  Prior to completion the appropriate flushing maneuvers were performed and the anastomosis was completed.  There is a good thrill within the fistula.  The patient had Doppler signals that did not change significantly with fistula compression, and the radial and ulnar artery.  Hemostasis was then achieved.  The incision was closed with 2 layers of 3-0 Vicryl followed by Dermabond.   Disposition: To PACU  stable   V. Annamarie Major, M.D. [facility] [place] Office: [phone] Pager:  [phone]

## 2018-05-29 NOTE — Progress Notes (Signed)
Pt still c/o CP, "its pretty bad", he sleeps when left alone, SR/ST 99-102, BP stable. Dr Marcie Bal at bedside and fully updated. New orders for 12 lead EKG & IV Metoporlol. Will continue to monitor.

## 2018-05-29 NOTE — Discharge Instructions (Signed)
° °  [facility] ° °Discharge Instructions ° °AV Fistula or Graft Surgery for Dialysis Access ° °Please refer to the following instructions for your post-procedure care. Your surgeon or physician assistant will discuss any changes with you. ° °Activity ° °You may drive the day following your surgery, if you are comfortable and no longer taking prescription pain medication. Resume full activity as the soreness in your incision resolves. ° °Bathing/Showering ° °You may shower after you go home. Keep your incision dry for 48 hours. Do not soak in a bathtub, hot tub, or swim until the incision heals completely. You may not shower if you have a hemodialysis catheter. ° °Incision Care ° °Clean your incision with mild soap and water after 48 hours. Pat the area dry with a clean towel. You do not need a bandage unless otherwise instructed. Do not apply any ointments or creams to your incision. You may have skin glue on your incision. Do not peel it off. It will come off on its own in about one week. Your arm may swell a bit after surgery. To reduce swelling use pillows to elevate your arm so it is above your heart. Your doctor will tell you if you need to lightly wrap your arm with an ACE bandage. ° °Diet ° °Resume your normal diet. There are not special food restrictions following this procedure. In order to heal from your surgery, it is CRITICAL to get adequate nutrition. Your body requires vitamins, minerals, and protein. Vegetables are the best source of vitamins and minerals. Vegetables also provide the perfect balance of protein. Processed food has little nutritional value, so try to avoid this. ° °Medications ° °Resume taking all of your medications. If your incision is causing pain, you may take over-the counter pain relievers such as acetaminophen (Tylenol). If you were prescribed a stronger pain medication, please be aware these medications can cause nausea and constipation. Prevent  nausea by taking the medication with a snack or meal. Avoid constipation by drinking plenty of fluids and eating foods with high amount of fiber, such as fruits, vegetables, and grains. Do not take Tylenol if you are taking prescription pain medications. ° ° ° ° °Follow up °Your surgeon may want to see you in the office following your access surgery. If so, this will be arranged at the time of your surgery. ° °Please call us immediately for any of the following conditions: ° °Increased pain, redness, drainage (pus) from your incision site °Fever of 101 degrees or higher °Severe or worsening pain at your incision site °Hand pain or numbness. ° °Reduce your risk of vascular disease: ° °Stop smoking. If you would like help, call [facility] at 1-800-QUIT-NOW (1-800-784-8669) or [place] at [phone] ° °Manage your cholesterol °Maintain a desired weight °Control your diabetes °Keep your blood pressure down ° °Dialysis ° °It will take several weeks to several months for your new dialysis access to be ready for use. Your surgeon will determine when it is OK to use it. Your nephrologist will continue to direct your dialysis. You can continue to use your Permcath until your new access is ready for use. ° °If you have any questions, please call the office at [phone]. ° °

## 2018-05-29 NOTE — Interval H&P Note (Signed)
History and Physical Interval Note:  05/29/2018 7:27 AM  Matthew Kirks Sr.  has presented today for surgery, with the diagnosis of CHRONIC KIDNEY DISEASE FOR HEMODIALYSIS ACCESS  The various methods of treatment have been discussed with the patient and family. After consideration of risks, benefits and other options for treatment, the patient has consented to  Procedure(s): ARTERIOVENOUS (AV) FISTULA CREATION VERSUS INSERTION OF ARTERIOVENOUS GORTEX GRAFT LEFT ARM (Left) as a surgical intervention .  The patient's history has been reviewed, patient examined, no change in status, stable for surgery.  I have reviewed the patient's chart and labs.  Questions were answered to the patient's satisfaction.     Annamarie Major

## 2018-05-29 NOTE — Telephone Encounter (Signed)
sch appt vm full mld ltr 07/07/18 9am Dialysis Duplex 945am p/o MD

## 2018-05-29 NOTE — Anesthesia Preprocedure Evaluation (Signed)
Anesthesia Evaluation  Patient identified by MRN, date of birth, ID band Patient awake    Reviewed: Allergy & Precautions, H&P , NPO status , Patient's Chart, lab work & pertinent test results  Airway Mallampati: II   Neck ROM: full    Dental   Pulmonary sleep apnea , former smoker,    breath sounds clear to auscultation       Cardiovascular hypertension, + angina + CAD, + Past MI, + Cardiac Stents, + CABG and +CHF  + Valvular Problems/Murmurs  Rhythm:regular Rate:Normal  Mild AS   Neuro/Psych    GI/Hepatic GERD  ,  Endo/Other  diabetes, Type 2obese  Renal/GU ESRF and DialysisRenal disease     Musculoskeletal   Abdominal   Peds  Hematology   Anesthesia Other Findings   Reproductive/Obstetrics                             Anesthesia Physical Anesthesia Plan  ASA: IV  Anesthesia Plan: General   Post-op Pain Management:    Induction: Intravenous  PONV Risk Score and Plan: 2 and Ondansetron, Dexamethasone and Treatment may vary due to age or medical condition  Airway Management Planned: LMA  Additional Equipment:   Intra-op Plan:   Post-operative Plan:   Informed Consent: I have reviewed the patients History and Physical, chart, labs and discussed the procedure including the risks, benefits and alternatives for the proposed anesthesia with the patient or authorized representative who has indicated his/her understanding and acceptance.     Plan Discussed with: CRNA, Anesthesiologist and Surgeon  Anesthesia Plan Comments:         Anesthesia Quick Evaluation

## 2018-05-29 NOTE — Progress Notes (Signed)
Wife at bedside-fully updated

## 2018-05-29 NOTE — Progress Notes (Signed)
Pt c/o his chest hurts, "feels like my regular angina". VSS, Dr Marcie Bal updated-order to use pt's home NTG Spray and dose he normally would use. I got this from pt's wife and he used 3 sprays SL at 1118. Will continue to monitor.

## 2018-05-29 NOTE — Progress Notes (Signed)
Pt states he feels better and is at his baseline. Wife her and agrees. VSS, NSR 97, RA O2 sat 98%.  Dr Marcie Bal fully updated-OK to dc pt to phase 2 and home.

## 2018-05-29 NOTE — Progress Notes (Signed)
Pt states CP "is easing up", but wants to use NTG spray again-he did 3 SL sprays. Will cont to monitor. VSS, he sleeps when left alone.

## 2018-05-29 NOTE — Transfer of Care (Signed)
Immediate Anesthesia Transfer of Care Note  Patient: Matthew DOOLING Sr.  Procedure(s) Performed: ARTERIOVENOUS (AV) FISTULA CREATION  LEFT ARM. (Left Arm Lower)  Patient Location: PACU  Anesthesia Type:General  Level of Consciousness: responds to stimulation  Airway & Oxygen Therapy: Patient Spontanous Breathing and Patient connected to face mask oxygen  Post-op Assessment: Report given to RN and Post -op Vital signs reviewed and stable  Post vital signs: Reviewed and stable  Last Vitals:  Vitals Value Taken Time  BP 147/87 05/29/2018 10:59 AM  Temp    Pulse 101 05/29/2018 11:00 AM  Resp 26 05/29/2018 11:00 AM  SpO2 99 % 05/29/2018 11:00 AM  Vitals shown include unvalidated device data.  Last Pain:  Vitals:   05/29/18 0756  TempSrc: Oral  PainSc:       Patients Stated Pain Goal: 0 (94/37/00 [id number])  Complications: No apparent anesthesia complications

## 2018-05-29 NOTE — Progress Notes (Signed)
Pt sleeps when left alone & appears comfortable. When awakened, he says he still has some CP, but its "eased up" and he wants to use NTG spray again-3 SL sprays done. Will cont to monitor.

## 2018-05-30 ENCOUNTER — Encounter (HOSPITAL_COMMUNITY): Payer: Self-pay | Admitting: Surgery

## 2018-05-30 NOTE — Anesthesia Postprocedure Evaluation (Signed)
Anesthesia Post Note  Patient: Matthew NYMAN Sr.  Procedure(s) Performed: ARTERIOVENOUS (AV) FISTULA CREATION  LEFT ARM. (Left Arm Lower)     Patient location during evaluation: PACU Anesthesia Type: General Level of consciousness: awake and alert Pain management: pain level controlled Vital Signs Assessment: post-procedure vital signs reviewed and stable Respiratory status: spontaneous breathing, nonlabored ventilation, respiratory function stable and patient connected to nasal cannula oxygen Cardiovascular status: blood pressure returned to baseline and stable Postop Assessment: no apparent nausea or vomiting Anesthetic complications: no    Last Vitals:  Vitals:   05/29/18 1300 05/29/18 1311  BP: 136/72 (!) 143/94  Pulse: 97 99  Resp: 18   Temp: (!) 36.1 C   SpO2: 97% 97%    Last Pain:  Vitals:   05/29/18 1311  TempSrc:   PainSc: [place]

## 2018-06-06 ENCOUNTER — Ambulatory Visit (INDEPENDENT_AMBULATORY_CARE_PROVIDER_SITE_OTHER): Payer: Medicare HMO | Admitting: Internal Medicine

## 2018-06-06 ENCOUNTER — Encounter: Payer: Self-pay | Admitting: Internal Medicine

## 2018-06-06 VITALS — BP 102/68 | HR 89 | Ht 65.5 in | Wt 236.8 lb

## 2018-06-06 DIAGNOSIS — I5032 Chronic diastolic (congestive) heart failure: Secondary | ICD-10-CM

## 2018-06-06 DIAGNOSIS — I25118 Atherosclerotic heart disease of native coronary artery with other forms of angina pectoris: Secondary | ICD-10-CM | POA: Diagnosis not present

## 2018-06-06 DIAGNOSIS — I255 Ischemic cardiomyopathy: Secondary | ICD-10-CM | POA: Diagnosis not present

## 2018-06-06 DIAGNOSIS — I10 Essential (primary) hypertension: Secondary | ICD-10-CM

## 2018-06-06 DIAGNOSIS — E785 Hyperlipidemia, unspecified: Secondary | ICD-10-CM | POA: Diagnosis not present

## 2018-06-06 MED ORDER — NITROGLYCERIN 0.4 MG SL SUBL: 0.4000 mg | SUBLINGUAL_TABLET | SUBLINGUAL | 5 refills | Status: DC | PRN

## 2018-06-06 MED ORDER — CARVEDILOL 25 MG PO TABS: 50.0000 mg | ORAL_TABLET | Freq: Two times a day (BID) | ORAL | 1 refills | Status: DC

## 2018-06-06 NOTE — Patient Instructions (Addendum)
Medication Instructions:  Your physician recommends that you continue on your current medications as directed. Please refer to the Current Medication list given to you today.  -- If you need a refill on your cardiac medications before your next appointment, please call your pharmacy. --  Labwork: None ordered  Testing/Procedures: None ordered  Follow-Up: Your physician wants you to follow-up in: 3 months with Dr. Irish Lack   Thank you for choosing [facility]!!    Any Other Special Instructions Will Be Listed Below (If Applicable).

## 2018-06-06 NOTE — Progress Notes (Signed)
Follow-up Outpatient Visit Date: 06/06/2018  Primary Care Provider: [name], MD [address] [place] [id number]  Chief Complaint: Follow-up coronary artery disease  HPI:  Matthew Rowe is a 57 y.o. 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  I last saw him in early June, at which time he was doing well since procuring a consistent supply of ranolazine through the [place].  Today, Matthew Rowe reports feeling well.  He had a left upper extremity dialysis fistula placed last week, though he is hopeful that he will not need to start dialysis for some time.  In the immediate postop period, he had a 10-minute episode of substernal chest pain similar to what he has experienced in the past with his angina.  Pain resolved spontaneously, though he notes a few brief recurrences in the days after his surgery.  He noted considerable swelling in his legs at that time as well.  He doubled his furosemide for a few days with resultant resolution of swelling and chest pain.  He is now back on furosemide 40 mg twice daily.  Prior to his surgery, he was walking regularly without any chest pain.  He denies shortness of breath, palpitations, lightheadedness.  He has stable two-pillow orthopnea.  He has been losing weight.  He was recently started on an appetite suppressant by his endocrinologist in hopes that he will lose even more weight with this.  --------------------------------------------------------------------------------------------------  Cardiovascular History & Procedures: Cardiovascular Problems:  Coronary artery disease with chronic stable angina  Ischemic cardiomyopathy and chronicdiastolic heart  failure  Risk Factors:  Known coronary artery disease, hypertension, hyperlipidemia, diabetes mellitus, and male gender  Cath/PCI:  LHC (12/02/13): LMCA with 30% distal stenosis. LAD with multiple stents and 50% ostial stenosis followed by 20% diffuse in-stent restenosis proximally. There is diffuse 50% in-stent restenosis in the mid and distal segments. The distal LAD is occluded. LCx is small with 40-50% ostial stenosis. OM 2 has a 50% ostial stenosis. Ramus intermedius is a large vessel with 70-80% stenosis involving its superior branch. 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 There is diffuse 20% proximal disease. There is a 90% stenosis involving the distal vessel just beyond the anastomosis with the RIMA. 60% stenosis extending into the RPDA is also evident. RIMA to RCA is widely patent with aforementioned stenosis just beyond the distal anastomosis.  PCI (12/03/13): Successful PCI to the mid/distal RCA with overlapping Promus drug-eluting stents (2.25 x 24 and 2.25 x 16 mm).  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  EP Procedures and Devices:  None  Non-Invasive Evaluation(s):  TTE (10/16/17): Normal LV size with moderate LVH. LVEF 55-60% with akinesis of the apical anteroseptal and apical segments. Grade 2 diastolic dysfunction. Mild aortic regurgitation. Mild to moderate mitral regurgitation. Biatrial enlargement and mild to moderate TR. Normal RV size and function.  Transthoracic echocardiogram (12/18/16): Normal LV size with moderate LVH. LVEF mildly reduced at 45-50% with anterior, apical septal, apical, and apical inferoapical akinesis. Grade 1 diastolic dysfunction. Trileaflet aortic valve with mild stenosis (mean gradient 14 mmHg). Moderately dilated left ventricle. Mildly dilated right ventricle with mildly reduced contraction. Normal pulmonary artery and central venous  pressure.  Pharmacologic myocardial  perfusion stress test (11/16/16): High risk study with large fixed inferior defect extending to the apex. Dilated left ventricle with akinesis of the inferior wall. LVEF 35%.  Transthoracic echocardiogram (12/18/16): Normal LV size with moderate LVH. LVEF mildly reduced (45-50%). There is anterior and apical akinesis suggestive of LAD disease. Mild aortic stenosis noted with a mean gradient of 14 mmHg. Left atrium moderately dilated. Right ventricle mildly dilated and mildly reduced contraction.  Pharmacologic myocardial perfusion stress test (11/16/16): Large fixed differential defect involving the inferior wall and apex consistent with scar. LVEF 35% with inferior akinesis.  Transthoracic echocardiogram (01/02/15): Normal LV size with mild LVH. LVEF 50-55% with normal wall motion and grade 1 diastolic dysfunction. Very mild aortic stenosis. Mitral and or calcium patient with mild leaflet thickening and regurgitation. Normal RV size and function.  Pharmacologic myocardial perfusion stress test (11/28/13): Large, partially reversible inferolateral defect. Fixed apical defect. LVEF 54%.  Recent CV Pertinent Labs: Lab Results  Component Value Date   CHOL 161 10/16/2017   CHOL 161 11/12/2016   HDL 41 10/16/2017   HDL 31 (L) 11/12/2016   LDLCALC 80 10/16/2017   LDLCALC 86 11/12/2016   LDLDIRECT 72.0 11/03/2014   TRIG 201 (H) 10/16/2017   CHOLHDL 3.9 10/16/2017   INR 1.00 10/15/2017   BNP 962.0 (H) 02/08/2017   K 3.8 05/29/2018   MG 1.6 (L) 04/04/2016   BUN 45 (H) 10/18/2017   BUN 35 (H) 03/29/2017   CREATININE 3.95 (H) 10/18/2017   CREATININE 2.47 (H) 04/19/2016    Past medical and surgical history were reviewed and updated in EPIC.  Current Meds  Medication Sig  . acetaminophen (TYLENOL) 500 MG tablet Take 500 mg by mouth 3 (three) times daily as needed (for pain or headaches).   Marland Kitchen allopurinol (ZYLOPRIM) 100 MG tablet Take 100 mg by mouth every morning.  Marland Kitchen amLODipine (NORVASC) 10 MG  tablet Take 1 tablet (10 mg total) by mouth daily.  Marland Kitchen aspirin EC 81 MG EC tablet Take 1 tablet (81 mg total) by mouth daily.  Marland Kitchen atorvastatin (LIPITOR) 80 MG tablet Take 1 tablet (80 mg total) by mouth daily.  . carvedilol (COREG) 25 MG tablet Take 2 tablets (50 mg total) by mouth 2 (two) times daily.  . Cholecalciferol 2000 units TABS Take 2,000 Units by mouth every morning.  . clopidogrel (PLAVIX) 75 MG tablet TAKE ONE TABLET BY MOUTH ONCE DAILY WITH BREAKFAST (Patient taking differently: Take 75 mg by mouth daily. )  . diphenhydrAMINE (BENADRYL) 25 MG tablet Take 25 mg by mouth every 6 (six) hours as needed for allergies or sleep.   . furosemide (LASIX) 40 MG tablet Take 1 tablet (40 mg total) by mouth 2 (two) times daily.  Marland Kitchen gabapentin (NEURONTIN) 300 MG capsule Take 1 capsule (300 mg total) by mouth 2 (two) times daily.  . hydrALAZINE (APRESOLINE) 50 MG tablet Take 1 tablet (50 mg total) by mouth 3 (three) times daily.  . Insulin NPH Isophane & Regular (NOVOLIN 70/30 [place]) Inject 20 Units into the skin 2 (two) times daily.   . isosorbide mononitrate (IMDUR) 120 MG 24 hr tablet Take 1 tablet (120 mg total) by mouth 2 (two) times daily.  . Misc. Devices MISC by Does not apply route. C-PAP  . Multiple Vitamin (MULTIVITAMIN WITH MINERALS) TABS tablet Take 1 tablet by mouth daily.  . nitroGLYCERIN (NITROSTAT) 0.4 MG SL tablet Place 1 tablet (0.4 mg total) under the tongue every 5 (five)  minutes as needed for chest pain.  . pantoprazole (PROTONIX) 40 MG tablet Take 40 mg by mouth daily.   . ranitidine (ZANTAC) 300 MG tablet Take 300 mg by mouth at bedtime.  . ranolazine (RANEXA) 500 MG 12 hr tablet Take 1 tablet (500 mg total) by mouth 2 (two) times daily.  . Semaglutide (OZEMPIC [place]) Inject 2.5 Units into the skin every Saturday.   . traMADol (ULTRAM) 50 MG tablet Take 1 tablet (50 mg total) by mouth every 6 (six) hours as needed for moderate pain.  . vitamin E 100 UNIT capsule Take 400 Units by  mouth daily.  Marland Kitchen zolpidem (AMBIEN) 10 MG tablet Take 10 mg by mouth at bedtime as needed for sleep.   . [DISCONTINUED] carvedilol (COREG) 25 MG tablet Take 2 tablets (50 mg total) by mouth 2 (two) times daily.  . [DISCONTINUED] nitroGLYCERIN (NITROSTAT) 0.4 MG SL tablet Place 1 tablet (0.4 mg total) under the tongue every 5 (five) minutes as needed for chest pain.    Allergies: Cleocin [clindamycin hcl]; Glimepiride [amaryl]; Clarithromycin; Colchicine; and Ciprocin-fluocin-procin [fluocinolone acetonide]  Social History   Tobacco Use  . Smoking status: Former Smoker    Types: Cigarettes  . Smokeless tobacco: Never Used  . Tobacco comment: smoked some as a teenager (high school) not even a pack a week  Substance Use Topics  . Alcohol use: No    Alcohol/week: 0.0 standard drinks    Comment: 09/15/2012 "drank a little when I was young"  . Drug use: No    Family History  Problem Relation Age of Onset  . Lung cancer Mother   . Anemia Mother   . Polycystic kidney disease Mother   . Diabetes Father   . Heart attack Father 12       Died suddenly  . Heart failure Father   . Hyperlipidemia Father   . Hypertension Father   . Kidney failure Sister 5       Died  . Heart attack Sister   . Hypertension Sister   . Diabetes Sister   . Diabetes Brother   . Polycystic kidney disease Brother   . Diabetes Sister     Review of Systems: A 12-system review of systems was performed and was negative except as noted in the HPI.  --------------------------------------------------------------------------------------------------  Physical Exam: BP 102/68   Pulse 89   Ht 5' 5.5" (1.664 m)   Wt 236 lb 12.8 oz (107.4 kg)   SpO2 99%   BMI 38.81 kg/m   General: NAD.  Accompanied by his wife. HEENT: No conjunctival pallor or scleral icterus. Moist mucous membranes.  OP clear. Neck: Supple without lymphadenopathy, thyromegaly, JVD, or HJR. Lungs: Normal work of breathing. Clear to auscultation  bilaterally without wheezes or crackles. Heart: Regular rate and rhythm without murmurs, rubs, or gallops. Non-displaced PMI. Abd: Bowel sounds present. Soft, NT/ND without hepatosplenomegaly Ext: Trace pretibial edema bilaterally.  Upper extremity fistula present without thrill. Skin: Warm and dry without rash.  EKG (05/29/2018 - personally reviewed): Normal sinus rhythm with left axis deviation, left axis LVH with QRS widening, and poor R wave progression.  No significant change from prior tracing on 02/12/2018.  Lab Results  Component Value Date   WBC 14.4 (H) 10/16/2017   HGB 10.9 (L) 05/29/2018   HCT 32.0 (L) 05/29/2018   MCV 86.6 10/16/2017   PLT 231 10/16/2017    Lab Results  Component Value Date   NA 139 05/29/2018   K 3.8 05/29/2018  CL 100 (L) 10/18/2017   CO2 20 (L) 10/18/2017   BUN 45 (H) 10/18/2017   CREATININE 3.95 (H) 10/18/2017   GLUCOSE 127 (H) 05/29/2018   ALT 17 10/15/2017    Lab Results  Component Value Date   CHOL 161 10/16/2017   HDL 41 10/16/2017   LDLCALC 80 10/16/2017   LDLDIRECT 72.0 11/03/2014   TRIG 201 (H) 10/16/2017   CHOLHDL 3.9 10/16/2017    --------------------------------------------------------------------------------------------------  ASSESSMENT AND PLAN: Coronary artery disease with stable angina Recent episodes of chest pain following fistula placement in the setting of volume overload are somewhat concerning for coronary insufficiency.  We discussed proceeding with cardiac catheterization but have agreed to defer this given likely progression to Robynn Marcel-stage renal disease with contrast exposure.  We will continue current medical therapy including carvedilol, ranolazine, and isosorbide mononitrate.  When hemodialysis is imminent or if angina worsens, cardiac catheterization will need to be reconsidered.  We will continue indefinite aspirin and clopidogrel.  Ischemic cardiomyopathy and HFpEF Matthew Rowe has trace leg edema but otherwise  appears euvolemic.  We will continue current regimen of furosemide 40 mg twice daily.  Sodium restriction encouraged.  Hypertension Blood pressure low normal today.  Continue current medications.  Hyperlipidemia LDL slightly above goal at last check in February.  We will continue atorvastatin 80 mg daily.  I encouraged weight loss, which will hopefully help Matthew Rowe achieve his target.  Follow-up: Given my transition to the [place] office, I will have Matthew Rowe follow-up with Dr. Irish Lack in 3 months.  Nelva Bush, MD 06/06/2018 7:16 PM

## 2018-06-09 ENCOUNTER — Other Ambulatory Visit: Payer: Self-pay

## 2018-06-09 DIAGNOSIS — N185 Chronic kidney disease, stage 5: Secondary | ICD-10-CM

## 2018-06-17 ENCOUNTER — Encounter (HOSPITAL_COMMUNITY): Payer: Self-pay | Admitting: Emergency Medicine

## 2018-06-17 ENCOUNTER — Other Ambulatory Visit: Payer: Self-pay

## 2018-06-17 ENCOUNTER — Emergency Department (HOSPITAL_COMMUNITY): Payer: Medicare HMO

## 2018-06-17 ENCOUNTER — Other Ambulatory Visit (HOSPITAL_COMMUNITY): Payer: Self-pay

## 2018-06-17 ENCOUNTER — Observation Stay (HOSPITAL_COMMUNITY)
Admission: EM | Admit: 2018-06-17 | Discharge: 2018-06-18 | Disposition: A | Discharge status: Home / Self Care | Payer: Medicare HMO | Source: Home / Self Care | Attending: Internal Medicine | Admitting: Internal Medicine

## 2018-06-17 DIAGNOSIS — Z6839 Body mass index (BMI) 39.0-39.9, adult: Secondary | ICD-10-CM | POA: Diagnosis not present

## 2018-06-17 DIAGNOSIS — T82858A Stenosis of vascular prosthetic devices, implants and grafts, initial encounter: Secondary | ICD-10-CM | POA: Diagnosis present

## 2018-06-17 DIAGNOSIS — I25110 Atherosclerotic heart disease of native coronary artery with unstable angina pectoris: Secondary | ICD-10-CM | POA: Diagnosis not present

## 2018-06-17 DIAGNOSIS — Z881 Allergy status to other antibiotic agents status: Secondary | ICD-10-CM | POA: Insufficient documentation

## 2018-06-17 DIAGNOSIS — Z955 Presence of coronary angioplasty implant and graft: Secondary | ICD-10-CM

## 2018-06-17 DIAGNOSIS — Z951 Presence of aortocoronary bypass graft: Secondary | ICD-10-CM

## 2018-06-17 DIAGNOSIS — R079 Chest pain, unspecified: Secondary | ICD-10-CM | POA: Diagnosis present

## 2018-06-17 DIAGNOSIS — E1122 Type 2 diabetes mellitus with diabetic chronic kidney disease: Secondary | ICD-10-CM

## 2018-06-17 DIAGNOSIS — J9600 Acute respiratory failure, unspecified whether with hypoxia or hypercapnia: Secondary | ICD-10-CM | POA: Diagnosis not present

## 2018-06-17 DIAGNOSIS — Z7982 Long term (current) use of aspirin: Secondary | ICD-10-CM | POA: Insufficient documentation

## 2018-06-17 DIAGNOSIS — M1A9XX0 Chronic gout, unspecified, without tophus (tophi): Secondary | ICD-10-CM | POA: Diagnosis present

## 2018-06-17 DIAGNOSIS — D631 Anemia in chronic kidney disease: Secondary | ICD-10-CM | POA: Diagnosis present

## 2018-06-17 DIAGNOSIS — N184 Chronic kidney disease, stage 4 (severe): Secondary | ICD-10-CM | POA: Diagnosis present

## 2018-06-17 DIAGNOSIS — I208 Other forms of angina pectoris: Secondary | ICD-10-CM | POA: Diagnosis not present

## 2018-06-17 DIAGNOSIS — M109 Gout, unspecified: Secondary | ICD-10-CM

## 2018-06-17 DIAGNOSIS — I255 Ischemic cardiomyopathy: Secondary | ICD-10-CM | POA: Diagnosis present

## 2018-06-17 DIAGNOSIS — Z7902 Long term (current) use of antithrombotics/antiplatelets: Secondary | ICD-10-CM

## 2018-06-17 DIAGNOSIS — I350 Nonrheumatic aortic (valve) stenosis: Secondary | ICD-10-CM | POA: Diagnosis present

## 2018-06-17 DIAGNOSIS — I200 Unstable angina: Secondary | ICD-10-CM | POA: Diagnosis not present

## 2018-06-17 DIAGNOSIS — Z794 Long term (current) use of insulin: Secondary | ICD-10-CM | POA: Insufficient documentation

## 2018-06-17 DIAGNOSIS — D638 Anemia in other chronic diseases classified elsewhere: Secondary | ICD-10-CM

## 2018-06-17 DIAGNOSIS — R0902 Hypoxemia: Secondary | ICD-10-CM | POA: Diagnosis not present

## 2018-06-17 DIAGNOSIS — E1121 Type 2 diabetes mellitus with diabetic nephropathy: Secondary | ICD-10-CM | POA: Diagnosis present

## 2018-06-17 DIAGNOSIS — Z79899 Other long term (current) drug therapy: Secondary | ICD-10-CM

## 2018-06-17 DIAGNOSIS — R0602 Shortness of breath: Secondary | ICD-10-CM | POA: Diagnosis not present

## 2018-06-17 DIAGNOSIS — G4700 Insomnia, unspecified: Secondary | ICD-10-CM | POA: Diagnosis present

## 2018-06-17 DIAGNOSIS — I5042 Chronic combined systolic (congestive) and diastolic (congestive) heart failure: Secondary | ICD-10-CM

## 2018-06-17 DIAGNOSIS — R9439 Abnormal result of other cardiovascular function study: Secondary | ICD-10-CM | POA: Diagnosis not present

## 2018-06-17 DIAGNOSIS — K219 Gastro-esophageal reflux disease without esophagitis: Secondary | ICD-10-CM

## 2018-06-17 DIAGNOSIS — J181 Lobar pneumonia, unspecified organism: Secondary | ICD-10-CM | POA: Diagnosis not present

## 2018-06-17 DIAGNOSIS — I447 Left bundle-branch block, unspecified: Secondary | ICD-10-CM | POA: Diagnosis present

## 2018-06-17 DIAGNOSIS — K5900 Constipation, unspecified: Secondary | ICD-10-CM | POA: Diagnosis not present

## 2018-06-17 DIAGNOSIS — I130 Hypertensive heart and chronic kidney disease with heart failure and stage 1 through stage 4 chronic kidney disease, or unspecified chronic kidney disease: Secondary | ICD-10-CM | POA: Diagnosis present

## 2018-06-17 DIAGNOSIS — N2581 Secondary hyperparathyroidism of renal origin: Secondary | ICD-10-CM | POA: Diagnosis present

## 2018-06-17 DIAGNOSIS — R0600 Dyspnea, unspecified: Secondary | ICD-10-CM | POA: Diagnosis not present

## 2018-06-17 DIAGNOSIS — I10 Essential (primary) hypertension: Secondary | ICD-10-CM | POA: Diagnosis present

## 2018-06-17 DIAGNOSIS — J9601 Acute respiratory failure with hypoxia: Secondary | ICD-10-CM | POA: Diagnosis not present

## 2018-06-17 DIAGNOSIS — Z87891 Personal history of nicotine dependence: Secondary | ICD-10-CM

## 2018-06-17 DIAGNOSIS — D649 Anemia, unspecified: Secondary | ICD-10-CM | POA: Diagnosis not present

## 2018-06-17 DIAGNOSIS — R0789 Other chest pain: Secondary | ICD-10-CM | POA: Diagnosis not present

## 2018-06-17 DIAGNOSIS — I5030 Unspecified diastolic (congestive) heart failure: Secondary | ICD-10-CM | POA: Diagnosis not present

## 2018-06-17 DIAGNOSIS — G8929 Other chronic pain: Secondary | ICD-10-CM

## 2018-06-17 DIAGNOSIS — N185 Chronic kidney disease, stage 5: Secondary | ICD-10-CM | POA: Diagnosis not present

## 2018-06-17 DIAGNOSIS — Q613 Polycystic kidney, unspecified: Secondary | ICD-10-CM | POA: Diagnosis not present

## 2018-06-17 DIAGNOSIS — I5043 Acute on chronic combined systolic (congestive) and diastolic (congestive) heart failure: Secondary | ICD-10-CM | POA: Diagnosis not present

## 2018-06-17 DIAGNOSIS — G4733 Obstructive sleep apnea (adult) (pediatric): Secondary | ICD-10-CM | POA: Diagnosis present

## 2018-06-17 DIAGNOSIS — E669 Obesity, unspecified: Secondary | ICD-10-CM | POA: Diagnosis present

## 2018-06-17 DIAGNOSIS — I5031 Acute diastolic (congestive) heart failure: Secondary | ICD-10-CM | POA: Diagnosis not present

## 2018-06-17 DIAGNOSIS — I252 Old myocardial infarction: Secondary | ICD-10-CM

## 2018-06-17 DIAGNOSIS — I5032 Chronic diastolic (congestive) heart failure: Secondary | ICD-10-CM | POA: Diagnosis not present

## 2018-06-17 DIAGNOSIS — R112 Nausea with vomiting, unspecified: Secondary | ICD-10-CM | POA: Diagnosis not present

## 2018-06-17 DIAGNOSIS — D72829 Elevated white blood cell count, unspecified: Secondary | ICD-10-CM | POA: Diagnosis not present

## 2018-06-17 DIAGNOSIS — E785 Hyperlipidemia, unspecified: Secondary | ICD-10-CM

## 2018-06-17 DIAGNOSIS — I152 Hypertension secondary to endocrine disorders: Secondary | ICD-10-CM | POA: Diagnosis present

## 2018-06-17 DIAGNOSIS — E1142 Type 2 diabetes mellitus with diabetic polyneuropathy: Secondary | ICD-10-CM | POA: Diagnosis present

## 2018-06-17 DIAGNOSIS — Z9861 Coronary angioplasty status: Secondary | ICD-10-CM | POA: Diagnosis not present

## 2018-06-17 DIAGNOSIS — I2510 Atherosclerotic heart disease of native coronary artery without angina pectoris: Secondary | ICD-10-CM | POA: Diagnosis not present

## 2018-06-17 DIAGNOSIS — E1159 Type 2 diabetes mellitus with other circulatory complications: Secondary | ICD-10-CM | POA: Diagnosis present

## 2018-06-17 LAB — CBC
HEMATOCRIT: 34.8 % — AB (ref 39.0–52.0)
Hemoglobin: 10.8 g/dL — ABNORMAL LOW (ref 13.0–17.0)
MCH: 28.7 pg (ref 26.0–34.0)
MCHC: 31 g/dL (ref 30.0–36.0)
MCV: 92.6 fL (ref 80.0–100.0)
NRBC: 0 % (ref 0.0–0.2)
PLATELETS: 251 10*3/uL (ref 150–400)
RBC: 3.76 MIL/uL — AB (ref 4.22–5.81)
RDW: 14.3 % (ref 11.5–15.5)
WBC: 10.7 10*3/uL — AB (ref 4.0–10.5)

## 2018-06-17 LAB — BASIC METABOLIC PANEL
ANION GAP: 9 (ref 5–15)
BUN: 45 mg/dL — ABNORMAL HIGH (ref 6–20)
CHLORIDE: 104 mmol/L (ref 98–111)
CO2: 23 mmol/L (ref 22–32)
Calcium: 9.1 mg/dL (ref 8.9–10.3)
Creatinine, Ser: 3.89 mg/dL — ABNORMAL HIGH (ref 0.61–1.24)
GFR calc non Af Amer: 16 mL/min — ABNORMAL LOW (ref >60)
GFR, EST AFRICAN AMERICAN: 18 mL/min — AB (ref >60)
Glucose, Bld: 186 mg/dL — ABNORMAL HIGH (ref 70–99)
Potassium: 4.1 mmol/L (ref 3.5–5.1)
SODIUM: 136 mmol/L (ref 135–145)

## 2018-06-17 LAB — PROTIME-INR
INR: 0.96
PROTHROMBIN TIME: 12.6 s (ref 11.4–15.2)

## 2018-06-17 LAB — I-STAT TROPONIN, ED: Troponin i, poc: 0 ng/mL (ref 0.00–0.08)

## 2018-06-17 LAB — BRAIN NATRIURETIC PEPTIDE: B Natriuretic Peptide: 181.8 pg/mL — ABNORMAL HIGH (ref 0.0–100.0)

## 2018-06-17 MED ORDER — HEPARIN (PORCINE) IN NACL 100-0.45 UNIT/ML-% IJ SOLN
1150.0000 [IU]/h | INTRAMUSCULAR | Status: DC
  Administered 2018-06-17: 1200 [IU]/h via INTRAVENOUS
  Filled 2018-06-17: qty 250

## 2018-06-17 MED ORDER — HEPARIN BOLUS VIA INFUSION
4000.0000 [IU] | Freq: Once | INTRAVENOUS | Status: AC
  Administered 2018-06-17: 4000 [IU] via INTRAVENOUS
  Filled 2018-06-17: qty 4000

## 2018-06-17 NOTE — Progress Notes (Addendum)
ANTICOAGULATION CONSULT NOTE - Initial Consult  Pharmacy Consult for heparin Indication: chest pain/ACS  Allergies  Allergen Reactions  . Cleocin [Clindamycin Hcl] Anaphylaxis and Swelling  . Glimepiride [Amaryl] Other (See Comments)    Elevates liver function   . Clarithromycin Itching and Other (See Comments)    "Biaxin" Eyes itch and burn  . Colchicine Other (See Comments)    Affected kidneys   . Ciprocin-Fluocin-Procin [Fluocinolone Acetonide] Rash    Patient Measurements: Height: 5' 5.5" (166.4 cm) Weight: 238 lb (108 kg) IBW/kg (Calculated) : 62.65 Heparin Dosing Weight: 87.2 kg  Vital Signs: Temp: 98.1 F (36.7 C) (10/08 1922) Temp Source: Oral (10/08 1922) BP: 135/82 (10/08 1922) Pulse Rate: 99 (10/08 1922)  Labs: Recent Labs    06/17/18 1934  HGB 10.8*  HCT 34.8*  PLT 251  LABPROT 12.6  INR 0.96  CREATININE 3.89*    Estimated Creatinine Clearance: 23.7 mL/min (A) (by C-G formula based on SCr of 3.89 mg/dL (H)).   Medical History: Past Medical History:  Diagnosis Date  . Acute on chronic combined systolic and diastolic CHF (congestive heart failure) ([place]) 10/15/2017  . Chronic anemia   . Chronic combined systolic and diastolic CHF (congestive heart failure) ([place])   . Chronic stable angina ([facility])   . CKD (chronic kidney disease), stage IV ([place])   . Coronary artery disease    a. INF MI 1999: PCI of RCA;  b.  extensive stenting of LAD;  c. s/p CABG with RIMA->RCA in 2006;  d. Stable, Low risk MV 05/2012; e. 09/2012 Cath: stable anatomy->med Rx. f. Abnl nuc 11/2013 -> med rx; g. 11/2013 Cath/PCI: s/p DES to RCA. h. Abnormal nuc 11/2016, pt declined cath.   . DM2 (diabetes mellitus, type 2) ([place])   . Dyslipidemia   . GERD (gastroesophageal reflux disease)   . History of gout   . Hypertension   . Hypertensive heart disease   . Ischemic cardiomyopathy   . Mild aortic stenosis   . Myocardial infarction [facility]) 1999; 2002; 2003; 2006; ~ 2008  . OSA on CPAP    wears CPAP  . Polycystic kidney disease     Medications:  Scheduled:  . heparin  4,000 Units Intravenous Once   Infusions:  . heparin      Assessment: Pt is 76 YOM presenting with intermittent chest pain since Sunday. PMH is significant for CAD s/p CABG and multiple PCI's, recurrent angina, DM, HTN, HLD, CKD, CHF, and gout. Pharmacy consulted for heparin dosing for ACS/chest pain. No PTA anticoagulation; negative troponins. Pt previously therapeutic at heparin 1400 units/hr in February 2019.  Goal of Therapy:  Heparin level 0.3-0.7 units/ml Monitor platelets by anticoagulation protocol: Yes   Plan:  Heparin bolus 4000 units x1, then Heparin gtt 1200 units/hr Obtain 8-hr HL at 0700 Daily HL and CBC  Vivien Barretto J Shuronda Santino 06/17/2018,10:24 PM

## 2018-06-17 NOTE — ED Provider Notes (Addendum)
[facility] EMERGENCY DEPARTMENT Provider Note   CSN: [id number] Arrival date & time: 06/17/18  1911     History   Chief Complaint Chief Complaint  Patient presents with  . Chest Pain    HPI Matthew Rowe. is a 57 y.o. male.  Matthew VERA Sr. is a 57 y.o. Male history of CAD s/p CABG in 2007, CHF, CKD, diabetes, hypertension, hyperlipidemia, GERD, gout, who presents to the emergency department for evaluation of chest pain.  Patient reports since Sunday he has been having intermittent central chest pains described as a pressure and heaviness.  He reports today the pain did intermittently radiate into the left arm.  He reports he is also had worsening exertional dyspnea.  He denies diaphoresis, nausea or vomiting.  No abdominal pain.  No lightheadedness, dizziness or syncope.  No headaches, weakness or numbness.  Reports he has history of chronic anginal pain but this is usually more mild and very responsive to nitroglycerin, and patient also takes Ranexa chronically.  He reports that he is used his nitro spray multiple times today with some improvement in pain but it never completely resolves his pain and it always comes back.  He also took to 81 mg aspirin prior to arrival.  If he denies worsening swelling in his legs.     Past Medical History:  Diagnosis Date  . Acute on chronic combined systolic and diastolic CHF (congestive heart failure) ([place]) 10/15/2017  . Chronic anemia   . Chronic combined systolic and diastolic CHF (congestive heart failure) ([place])   . Chronic stable angina ([place])   . CKD (chronic kidney disease), stage IV ([place])   . Coronary artery disease    a. INF MI 1999: PCI of RCA;  b.  extensive stenting of LAD;  c. s/p CABG with RIMA->RCA in 2006;  d. Stable, Low risk MV 05/2012; e. 09/2012 Cath: stable anatomy->med Rx. f. Abnl nuc 11/2013 -> med rx; g. 11/2013 Cath/PCI: s/p DES to RCA. h. Abnormal nuc 11/2016, pt declined cath.   . DM2 (diabetes  mellitus, type 2) ([place])   . Dyslipidemia   . GERD (gastroesophageal reflux disease)   . History of gout   . Hypertension   . Hypertensive heart disease   . Ischemic cardiomyopathy   . Mild aortic stenosis   . Myocardial infarction [facility]) 1999; 2002; 2003; 2006; ~ 2008  . OSA on CPAP    wears CPAP  . Polycystic kidney disease     Patient Active Problem List   Diagnosis Date Noted  . Chest pain 06/17/2018  . Chronic heart failure with preserved ejection fraction ([place]) 06/06/2018  . Anemia due to stage 4 chronic kidney disease ([place]) 02/12/2018  . Coronary artery disease of native heart with stable angina pectoris ([place]) 10/25/2017  . Chronic diastolic heart failure ([place]) 10/25/2017  . Chronic anemia 10/15/2017  . Elevated troponin 10/15/2017  . CKD (chronic kidney disease) stage 4, GFR 15-29 ml/min (H[facility]) 07/22/2017  . Hyperkalemia 04/01/2017  . Non-ST elevation (NSTEMI) myocardial infarction ([place]) 04/01/2017  . Chest pain with moderate risk of acute coronary syndrome 04/01/2017  . Hx of CABG   . Ischemic cardiomyopathy 02/21/2017  . Hyperlipidemia LDL goal <70   . GERD (gastroesophageal reflux disease) 05/08/2015  . Gout 05/08/2015  . MI (myocardial infarction) ([place]) 04/19/2015  . CAD- S/P multiple PCIs, CABG   . Hypertensive heart disease   . Precordial chest pain 11/27/2013  . Financial difficulty 03/13/2013  .  Medically noncompliant 03/13/2013  . Type 2 diabetes with nephropathy ([place])   . Obesity-BMI 42 11/27/2012  . Polycystic kidney disease 09/15/2012  . OSA on CPAP 09/15/2012  . Hypertension 03/27/2011  . Hyperlipidemia associated with type 2 diabetes mellitus [facility])     Past Surgical History:  Procedure Laterality Date  . AV FISTULA PLACEMENT Left 05/29/2018   Procedure: ARTERIOVENOUS (AV) FISTULA CREATION  LEFT ARM.;  Surgeon: Serafina Mitchell, MD;  Location: [facility];  Service: Vascular;  Laterality: Left;  . CARDIAC CATHETERIZATION  2012  . CARDIAC CATHETERIZATION   2013  . CARDIAC CATHETERIZATION  2014   LHC (1/14): total occlusion of distal LAD, diffuse disease in ramus, 70% proximal LCx, 50% proximal and mid RCA, 50% distal RCA, patent RIMA-RCA.  Marland Kitchen CORONARY ANGIOPLASTY WITH STENT PLACEMENT  1999 / 2002 / 2004 / 2006?  . CORONARY ARTERY BYPASS GRAFT  2006   CABG X?; in [place]  . CYSTECTOMY  1990's   off face  . HERNIA REPAIR     inguinal hernia  . LEFT HEART CATHETERIZATION WITH CORONARY/GRAFT ANGIOGRAM N/A 09/16/2012   Procedure: LEFT HEART CATHETERIZATION WITH [name];  Surgeon: [name], MD;  Location[facility] CATH LAB;  Service: Cardiovascular;  Laterality: N/A;  . LEFT HEART CATHETERIZATION WITH CORONARY/GRAFT ANGIOGRAM N/A 12/02/2013   Procedure: LEFT HEART CATHETERIZATION WITH [name];  Surgeon: [name], MD;  Location: [facility] CATH LAB;  Service: Cardiovascular;  Laterality: N/A;  . PERCUTANEOUS CORONARY STENT INTERVENTION (PCI-S) N/A 12/03/2013   Procedure: PERCUTANEOUS CORONARY STENT INTERVENTION (PCI-S);  Surgeon: [name], MD;  Location: [facility] CATH LAB;  Service: Cardiovascular;  Laterality: N/A;        Home Medications    Prior to Admission medications   Medication Sig Start Date End Date Taking? Authorizing Provider  acetaminophen (TYLENOL) 500 MG tablet Take 500 mg by mouth 3 (three) times daily as needed (for pain or headaches).    Yes [provider]  allopurinol (ZYLOPRIM) 100 MG tablet Take 100 mg by mouth daily.    Yes [provider]  amLODipine (NORVASC) 10 MG tablet Take 1 tablet (10 mg total) by mouth daily. 01/12/15  Yes Copland, Gay Filler, MD  aspirin EC 81 MG EC tablet Take 1 tablet (81 mg total) by mouth daily. 10/19/17  Yes Kilroy, Luke K, PA-C  atorvastatin (LIPITOR) 80 MG tablet Take 1 tablet (80 mg total) by mouth daily. 08/26/17  Yes Dunn, Dayna N, PA-C  carvedilol (COREG) 25 MG tablet Take 2 tablets (50 mg total) by mouth 2 (two) times daily. 06/06/18  Yes  End, Harrell Gave, MD  Cholecalciferol 2000 units TABS Take 2,000 Units by mouth daily.    Yes [provider]  clopidogrel (PLAVIX) 75 MG tablet TAKE ONE TABLET BY MOUTH ONCE DAILY WITH BREAKFAST Patient taking differently: Take 75 mg by mouth daily.  12/25/16  Yes End, Harrell Gave, MD  diphenhydrAMINE (BENADRYL) 25 MG tablet Take 25 mg by mouth every 6 (six) hours as needed for allergies or sleep.    Yes [provider]  furosemide (LASIX) 40 MG tablet Take 1 tablet (40 mg total) by mouth 2 (two) times daily. 12/27/17  Yes End, Harrell Gave, MD  gabapentin (NEURONTIN) 300 MG capsule Take 1 capsule (300 mg total) by mouth 2 (two) times daily. 04/03/17  Yes Eulogio Bear U, DO  hydrALAZINE (APRESOLINE) 50 MG tablet Take 1 tablet (50 mg total) by mouth 3 (three) times daily. 12/20/16  Yes End, Harrell Gave,  MD  Insulin NPH Isophane & Regular (NOVOLIN 70/30 [place]) Inject 20 Units into the skin 2 (two) times daily.    Yes [provider]  isosorbide mononitrate (IMDUR) 120 MG 24 hr tablet Take 1 tablet (120 mg total) by mouth 2 (two) times daily. 08/26/17  Yes Dunn, Dayna N, PA-C  Multiple Vitamin (MULTIVITAMIN WITH MINERALS) TABS tablet Take 1 tablet by mouth daily.   Yes [provider]  nitroGLYCERIN (NITROSTAT) 0.4 MG SL tablet Place 1 tablet (0.4 mg total) under the tongue every 5 (five) minutes as needed for chest pain. 06/06/18  Yes End, Harrell Gave, MD  pantoprazole (PROTONIX) 40 MG tablet Take 40 mg by mouth daily.    Yes [provider]  ranitidine (ZANTAC) 300 MG tablet Take 300 mg by mouth at bedtime.   Yes [provider]  ranolazine (RANEXA) 500 MG 12 hr tablet Take 1 tablet (500 mg total) by mouth 2 (two) times daily. 03/29/17  Yes End, Harrell Gave, MD  Semaglutide [facility]) Inject 2.5 Units into the skin every Saturday.    Yes [provider]  traMADol (ULTRAM) 50 MG tablet Take 1 tablet (50 mg total) by mouth every 6 (six) hours as  needed for moderate pain. 05/29/18  Yes Ulyses Amor, PA-C  vitamin E 100 UNIT capsule Take 400 Units by mouth daily.   Yes [provider]  zolpidem (AMBIEN) 10 MG tablet Take 10 mg by mouth at bedtime as needed for sleep.  09/30/17  Yes [provider]  [place]. Devices MISC by Does not apply route. C-PAP    [provider]    Family History Family History  Problem Relation Age of Onset  . Lung cancer Mother   . Anemia Mother   . Polycystic kidney disease Mother   . Diabetes Father   . Heart attack Father 11       Died suddenly  . Heart failure Father   . Hyperlipidemia Father   . Hypertension Father   . Kidney failure Sister 5       Died  . Heart attack Sister   . Hypertension Sister   . Diabetes Sister   . Diabetes Brother   . Polycystic kidney disease Brother   . Diabetes Sister     Social History Social History   Tobacco Use  . Smoking status: Former Smoker    Types: Cigarettes  . Smokeless tobacco: Never Used  . Tobacco comment: smoked some as a teenager (high school) not even a pack a week  Substance Use Topics  . Alcohol use: No    Alcohol/week: 0.0 standard drinks    Comment: 09/15/2012 "drank a little when I was young"  . Drug use: No     Allergies   Cleocin [clindamycin hcl]; Glimepiride [amaryl]; Clarithromycin; Colchicine; and Ciprocin-fluocin-procin [fluocinolone acetonide]   Review of Systems Review of Systems  Constitutional: Negative for chills and fever.  HENT: Negative for congestion, rhinorrhea and sore throat.   Eyes: Negative for visual disturbance.  Respiratory: Positive for shortness of breath. Negative for cough, chest tightness and wheezing.   Cardiovascular: Positive for chest pain. Negative for palpitations and leg swelling.  Gastrointestinal: Negative for abdominal pain, constipation, nausea and vomiting.  Genitourinary: Negative for dysuria and frequency.  Musculoskeletal: Negative for arthralgias and  myalgias.  Skin: Negative for color change and rash.  Neurological: Negative for dizziness, light-headedness and headaches.     Physical Exam Updated Vital Signs BP 135/82 (BP Location: Right Arm)  Pulse 99   Temp 98.1 F (36.7 C) (Oral)   Resp 18   Ht 5' 5.5" (1.664 m)   Wt 108 kg   SpO2 100%   BMI 39.00 kg/m   Physical Exam  Constitutional: He appears well-developed and well-nourished. He does not appear ill. No distress.  HENT:  Head: Normocephalic and atraumatic.  Eyes: Pupils are equal, round, and reactive to light. EOM are normal. Right eye exhibits no discharge. Left eye exhibits no discharge.  Neck: Normal range of motion. Neck supple. No JVD present. No tracheal deviation present.  Cardiovascular: Normal rate and regular rhythm. Exam reveals no gallop and no friction rub.  No murmur heard. Pulses:      Radial pulses are 2+ on the right side, and 2+ on the left side.       Dorsalis pedis pulses are 2+ on the right side, and 2+ on the left side.       Posterior tibial pulses are 2+ on the right side, and 2+ on the left side.  Pulmonary/Chest: Effort normal and breath sounds normal. No respiratory distress.  Respirations equal and unlabored, patient able to speak in full sentences, lungs clear to auscultation bilaterally, chest NTTP  Abdominal: Soft. Bowel sounds are normal. He exhibits no distension and no mass. There is no tenderness. There is no guarding.  Abdomen soft, nondistended, nontender to palpation in all quadrants without guarding or peritoneal signs  Musculoskeletal:       Right lower leg: Normal. He exhibits no tenderness and no edema.       Left lower leg: Normal. He exhibits no tenderness and no edema.  Neurological: He is alert. Coordination normal.  Skin: Skin is warm and dry. Capillary refill takes less than 2 seconds. He is not diaphoretic.  Psychiatric: He has a normal mood and affect. His behavior is normal.  Nursing note and vitals  reviewed.    ED Treatments / Results  Labs (all labs ordered are listed, but only abnormal results are displayed) Labs Reviewed  BASIC METABOLIC PANEL - Abnormal; Notable for the following components:      Result Value   Glucose, Bld 186 (*)    BUN 45 (*)    Creatinine, Ser 3.89 (*)    GFR calc non Af Amer 16 (*)    GFR calc Af Amer 18 (*)    All other components within normal limits  CBC - Abnormal; Notable for the following components:   WBC 10.7 (*)    RBC 3.76 (*)    Hemoglobin 10.8 (*)    HCT 34.8 (*)    All other components within normal limits  BRAIN NATRIURETIC PEPTIDE - Abnormal; Notable for the following components:   B Natriuretic Peptide 181.8 (*)    All other components within normal limits  PROTIME-INR  I-STAT TROPONIN, ED    EKG EKG Interpretation  Date/Time:  Tuesday June 17 2018 21:40:38 EDT Ventricular Rate:  88 PR Interval:    QRS Duration: 121 QT Interval:  376 QTC Calculation: 455 R Axis:   -62 Text Interpretation:  Sinus rhythm Probable left atrial enlargement Nonspecific IVCD with LAD Left ventricular hypertrophy Inferior infarct, old Anterior infarct, old Lateral leads are also involved No significant change since last tracing Confirmed by Dorie Rank [phone]) on 06/17/2018 9:46:14 PM   Radiology Dg Chest 2 View  Result Date: 06/17/2018 CLINICAL DATA:  Chest pain. EXAM: CHEST - 2 VIEW COMPARISON:  Radiograph of October 16, 2017. FINDINGS: Stable cardiomediastinal  silhouette. Sternotomy wires are noted. No pneumothorax or pleural effusion is noted. Both lungs are clear. The visualized skeletal structures are unremarkable. IMPRESSION: No active cardiopulmonary disease. Electronically Signed   By: Marijo Conception, M.D.   On: 06/17/2018 20:27    Procedures .Critical Care Performed by: Jacqlyn Larsen, PA-C Authorized by: Jacqlyn Larsen, PA-C   Critical care provider statement:    Critical care time (minutes):  45   Critical care was necessary  to treat or prevent imminent or life-threatening deterioration of the following conditions:  Circulatory failure and cardiac failure   Critical care was time spent personally by me on the following activities:  Discussions with consultants, evaluation of patient's response to treatment, examination of patient, ordering and performing treatments and interventions, ordering and review of laboratory studies, ordering and review of radiographic studies, pulse oximetry, re-evaluation of patient's condition, obtaining history from patient or surrogate and review of old charts   (including critical care time)  Medications Ordered in ED Medications  heparin ADULT infusion 100 units/mL (25000 units/272mL sodium chloride 0.45%) (1,200 Units/hr Intravenous New Bag/Given 06/17/18 2232)  heparin bolus via infusion 4,000 Units (4,000 Units Intravenous Bolus from Bag 06/17/18 2234)     Initial Impression / Assessment and Plan / ED Course  I have reviewed the triage vital signs and the nursing notes.  Pertinent labs & imaging results that were available during my care of the patient were reviewed by me and considered in my medical decision making (see chart for details).  Patient presents with recurrent episodes of chest pain for the past 3 days, patient has history of coronary artery disease and has had CABG and is followed regularly with cardiology, has chronic angina but he reports the symptoms he been having are worse than usual and are not resolving with nitroglycerin.  He does have some associated dyspnea on exertion, pain has occasionally radiated into the left arm, no lightheadedness or syncope.  No abdominal pain, nausea, vomiting or diaphoresis.  Feel that patient is very high risk for ACS, story does not seem consistent with dissection or PE.   Chest pain work-up initiated from triage, initial EKG without concerning ischemic changes.  Initial troponin is negative.  No acute electrolyte derangements,  creatinine is at baseline patient recently underwent surgery to have dialysis fistula placed but is not yet on dialysis.  Minimal leukocytosis and stable hemoglobin.  BNP is not significantly elevated.  Chest x-ray is clear.  Despite initial reassuring work-up patient is high risk for cardiac disease will discuss with cardiology and start patient on heparin drip.  Dr. George Lions cardiology who agrees with heparin drip until enzymes are cycled and it is determined what further work-up the patient will need.  He recommends hospitalist admission and he will see the patient in consult.  Case discussed with Dr. Marlowe Sax with triad hospitalists who will see and admit the patient.    Final Clinical Impressions(s) / ED Diagnoses   Final diagnoses:  Unstable angina pectoris [facility])    ED Discharge Orders    None       Janet Berlin 06/18/18 4034    Dorie Rank, MD 06/18/18 2341    Jacqlyn Larsen, PA-C 07/14/18 1349    Dorie Rank, MD 07/14/18 1538

## 2018-06-17 NOTE — Consult Note (Signed)
Cardiology Consultation Note    Patient ID: Matthew Julian., MRN: [id number], DOB/AGE: 1961-08-19 57 y.o. Admit date: 06/17/2018   Date of Consult: 06/17/2018 Primary Physician: [name], MD Primary Cardiologist: Dr. Saunders Revel  Reason for Consultation: Chest pain Requesting MD: Dr. Tomi Bamberger  HPI: Matthew Baldi. is a 57 y.o. 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  The patient does have chronic angina, although this is been relatively well controlled with Imdur and ranolazine.  Approximately 2 days ago he noticed new onset chest pain that is waxing and waning, worsens with exertion and responsive to nitroglycerin.  Given ongoing chest discomfort, he presented to the [facility] ED for evaluation.  Vital signs were within normal limits.  Initial labs were unrevealing, notable only for stable CKD with a creatinine of 3.89.  Initial troponin was 0.  ECGs showed no acute ischemic changes.  IV heparin was started for presumed unstable coronary syndrome.  Cardiology is consulted for aid in management, and he is being admitted to the hospitalist service.  Upon my interview, the patient is chest pain-free.  Of note, he is being worked up for renal transplant at the [place].  He has a stress echocardiogram scheduled there on 10/10 as part of the routine transplant evaluation..   Past Medical History:  Diagnosis Date  . Acute on chronic combined systolic and diastolic CHF (congestive heart failure) ([place]) 10/15/2017  . Chronic anemia   . Chronic combined systolic and diastolic CHF (congestive heart failure) ([place])   . Chronic stable angina ([place])   . CKD (chronic kidney disease), stage IV ([place])   . Coronary artery disease    a. INF MI 1999: PCI of RCA;  b.  extensive stenting of LAD;  c. s/p CABG with  RIMA->RCA in 2006;  d. Stable, Low risk MV 05/2012; e. 09/2012 Cath: stable anatomy->med Rx. f. Abnl nuc 11/2013 -> med rx; g. 11/2013 Cath/PCI: s/p DES to RCA. h. Abnormal nuc 11/2016, pt declined cath.   . DM2 (diabetes mellitus, type 2) ([place])   . Dyslipidemia   . GERD (gastroesophageal reflux disease)   . History of gout   . Hypertension   . Hypertensive heart disease   . Ischemic cardiomyopathy   . Mild aortic stenosis   . Myocardial infarction [facility]) 1999; 2002; 2003; 2006; ~ 2008  . OSA on CPAP    wears CPAP  . Polycystic kidney disease       Surgical History:  Past Surgical History:  Procedure Laterality Date  . AV FISTULA PLACEMENT Left 05/29/2018   Procedure: ARTERIOVENOUS (AV) FISTULA CREATION  LEFT ARM.;  Surgeon: Serafina Mitchell, MD;  Location: [facility];  Service: Vascular;  Laterality: Left;  . CARDIAC CATHETERIZATION  2012  . CARDIAC CATHETERIZATION  2013  . CARDIAC CATHETERIZATION  2014   LHC (1/14): total occlusion of distal LAD, diffuse disease in ramus, 70% proximal LCx, 50% proximal and mid RCA, 50% distal RCA, patent RIMA-RCA.  Marland Kitchen CORONARY ANGIOPLASTY WITH STENT PLACEMENT  1999 / 2002 / 2004 / 2006?  . CORONARY ARTERY BYPASS GRAFT  2006   CABG X?; in [place]  . CYSTECTOMY  1990's   off face  . HERNIA REPAIR     inguinal hernia  . LEFT HEART CATHETERIZATION WITH CORONARY/GRAFT ANGIOGRAM N/A  09/16/2012   Procedure: LEFT HEART CATHETERIZATION WITH [name];  Surgeon: [name], MD;  Location: [facility] C[facility] LAB;  Service: Cardiovascular;  Laterality: N/A;  . LEFT HEART CATHETERIZATION WITH CORONARY/GRAFT ANGIOGRAM N/A 12/02/2013   Procedure: LEFT HEART CATHETERIZATION WITH [name];  Surgeon: [name], MD;  Location: [facility] CATH LAB;  Service: Cardiovascular;  Laterality: N/A;  . PERCUTANEOUS CORONARY STENT INTERVENTION (PCI-S) N/A 12/03/2013   Procedure: PERCUTANEOUS CORONARY STENT INTERVENTION (PCI-S);  Surgeon: [name], MD;   Location: [facility] CATH LAB;  Service: Cardiovascular;  Laterality: N/A;     Home Meds: Prior to Admission medications   Medication Sig Start Date End Date Taking? Authorizing Provider  acetaminophen (TYLENOL) 500 MG tablet Take 500 mg by mouth 3 (three) times daily as needed (for pain or headaches).    Yes [provider]  allopurinol (ZYLOPRIM) 100 MG tablet Take 100 mg by mouth daily.    Yes [provider]  amLODipine (NORVASC) 10 MG tablet Take 1 tablet (10 mg total) by mouth daily. 01/12/15  Yes Copland, Gay Filler, MD  aspirin EC 81 MG EC tablet Take 1 tablet (81 mg total) by mouth daily. 10/19/17  Yes Kilroy, Luke K, PA-C  atorvastatin (LIPITOR) 80 MG tablet Take 1 tablet (80 mg total) by mouth daily. 08/26/17  Yes Dunn, Dayna N, PA-C  carvedilol (COREG) 25 MG tablet Take 2 tablets (50 mg total) by mouth 2 (two) times daily. 06/06/18  Yes End, Harrell Gave, MD  Cholecalciferol 2000 units TABS Take 2,000 Units by mouth daily.    Yes [provider]  clopidogrel (PLAVIX) 75 MG tablet TAKE ONE TABLET BY MOUTH ONCE DAILY WITH BREAKFAST Patient taking differently: Take 75 mg by mouth daily.  12/25/16  Yes End, Harrell Gave, MD  diphenhydrAMINE (BENADRYL) 25 MG tablet Take 25 mg by mouth every 6 (six) hours as needed for allergies or sleep.    Yes [provider]  furosemide (LASIX) 40 MG tablet Take 1 tablet (40 mg total) by mouth 2 (two) times daily. 12/27/17  Yes End, Harrell Gave, MD  gabapentin (NEURONTIN) 300 MG capsule Take 1 capsule (300 mg total) by mouth 2 (two) times daily. 04/03/17  Yes Eulogio Bear U, DO  hydrALAZINE (APRESOLINE) 50 MG tablet Take 1 tablet (50 mg total) by mouth 3 (three) times daily. 12/20/16  Yes End, Harrell Gave, MD  Insulin NPH Isophane & Regular (NOVOLIN 70/30 [place]) Inject 20 Units into the skin 2 (two) times daily.    Yes [provider]  isosorbide mononitrate (IMDUR) 120 MG 24 hr tablet Take 1 tablet (120 mg total) by mouth 2 (two)  times daily. 08/26/17  Yes Dunn, Dayna N, PA-C  Multiple Vitamin (MULTIVITAMIN WITH MINERALS) TABS tablet Take 1 tablet by mouth daily.   Yes [provider]  nitroGLYCERIN (NITROSTAT) 0.4 MG SL tablet Place 1 tablet (0.4 mg total) under the tongue every 5 (five) minutes as needed for chest pain. 06/06/18  Yes End, Harrell Gave, MD  pantoprazole (PROTONIX) 40 MG tablet Take 40 mg by mouth daily.    Yes [provider]  ranitidine (ZANTAC) 300 MG tablet Take 300 mg by mouth at bedtime.   Yes [provider]  ranolazine (RANEXA) 500 MG 12 hr tablet Take 1 tablet (500 mg total) by mouth 2 (two) times daily. 03/29/17  Yes End, Harrell Gave, MD  Semaglutide [facility]) Inject 2.5 Units into the skin every Saturday.    Yes [provider]  traMADol (ULTRAM) 50 MG  tablet Take 1 tablet (50 mg total) by mouth every 6 (six) hours as needed for moderate pain. 05/29/18  Yes Ulyses Amor, PA-C  vitamin E 100 UNIT capsule Take 400 Units by mouth daily.   Yes [provider]  zolpidem (AMBIEN) 10 MG tablet Take 10 mg by mouth at bedtime as needed for sleep.  09/30/17  Yes [provider]  [place]. Devices MISC by Does not apply route. C-PAP    [provider]    Inpatient Medications:   . heparin 1,200 Units/hr (06/17/18 2232)    Allergies:  Allergies  Allergen Reactions  . Cleocin [Clindamycin Hcl] Anaphylaxis and Swelling  . Glimepiride [Amaryl] Other (See Comments)    Elevates liver function   . Clarithromycin Itching and Other (See Comments)    "Biaxin" Eyes itch and burn  . Colchicine Other (See Comments)    Affected kidneys   . Ciprocin-Fluocin-Procin [Fluocinolone Acetonide] Rash    Social History   Socioeconomic History  . Marital status: Married    Spouse name: Matthew Rowe  . Number of children: 5  . Years of education: Not on file  . Highest education level: Not on file  Occupational History  . Occupation: retired    Comment:  Chartered loss adjuster  Social Needs  . Financial resource strain: Not on file  . Food insecurity:    Worry: Not on file    Inability: Not on file  . Transportation needs:    Medical: Not on file    Non-medical: Not on file  Tobacco Use  . Smoking status: Former Smoker    Types: Cigarettes  . Smokeless tobacco: Never Used  . Tobacco comment: smoked some as a teenager (high school) not even a pack a week  Substance and Sexual Activity  . Alcohol use: No    Alcohol/week: 0.0 standard drinks    Comment: 09/15/2012 "drank a little when I was young"  . Drug use: No  . Sexual activity: Yes  Lifestyle  . Physical activity:    Days per week: Not on file    Minutes per session: Not on file  . Stress: Not on file  Relationships  . Social connections:    Talks on phone: Not on file    Gets together: Not on file    Attends religious service: Not on file    Active member of club or organization: Not on file    Attends meetings of clubs or organizations: Not on file    Relationship status: Not on file  . Intimate partner violence:    Fear of current or ex partner: Not on file    Emotionally abused: Not on file    Physically abused: Not on file    Forced sexual activity: Not on file  Other Topics Concern  . Not on file  Social History Narrative   Married and lives with wife in [place]. On disability.    Consumes about 2 Coke Zeros a day      Family History  Problem Relation Age of Onset  . Lung cancer Mother   . Anemia Mother   . Polycystic kidney disease Mother   . Diabetes Father   . Heart attack Father 65       Died suddenly  . Heart failure Father   . Hyperlipidemia Father   . Hypertension Father   . Kidney failure Sister 5       Died  . Heart attack Sister   . Hypertension Sister   .  Diabetes Sister   . Diabetes Brother   . Polycystic kidney disease Brother   . Diabetes Sister      Review of Systems: All other systems reviewed and are otherwise negative except as noted  above.  Labs: No results for input(s): CKTOTAL, CKMB, TROPONINI in the last 72 hours. Lab Results  Component Value Date   WBC 10.7 (H) 06/17/2018   HGB 10.8 (L) 06/17/2018   HCT 34.8 (L) 06/17/2018   MCV 92.6 06/17/2018   PLT 251 06/17/2018    Recent Labs  Lab 06/17/18 1934  NA 136  K 4.1  CL 104  CO2 23  BUN 45*  CREATININE 3.89*  CALCIUM 9.1  GLUCOSE 186*   Lab Results  Component Value Date   CHOL 161 10/16/2017   HDL 41 10/16/2017   LDLCALC 80 10/16/2017   TRIG 201 (H) 10/16/2017   Lab Results  Component Value Date   DDIMER 0.66 (H) 02/08/2017    Radiology/Studies:  Dg Chest 2 View  Result Date: 06/17/2018 CLINICAL DATA:  Chest pain. EXAM: CHEST - 2 VIEW COMPARISON:  Radiograph of October 16, 2017. FINDINGS: Stable cardiomediastinal silhouette. Sternotomy wires are noted. No pneumothorax or pleural effusion is noted. Both lungs are clear. The visualized skeletal structures are unremarkable. IMPRESSION: No active cardiopulmonary disease. Electronically Signed   By: Marijo Conception, M.D.   On: 06/17/2018 20:27    Wt Readings from Last 3 Encounters:  06/17/18 108 kg  06/06/18 107.4 kg  05/29/18 107 kg    EKG: Sinus rhythm, prior inferior, anterior, and anterolateral infarcts.  Unchanged from priors, no acute ischemic changes.  Physical Exam: Blood pressure 135/82, pulse 99, temperature 98.1 F (36.7 C), temperature source Oral, resp. rate 18, height 5' 5.5" (1.664 m), weight 108 kg, SpO2 100 %. Body mass index is 39 kg/m. General: Well developed, well nourished, in no acute distress. Head: Normocephalic, atraumatic, sclera non-icteric, no xanthomas, nares are without discharge.  Neck: Negative for carotid bruits. JVD not elevated. Lungs: Clear bilaterally to auscultation without wheezes, rales, or rhonchi. Breathing is unlabored. Heart: RRR with S1 S2.  Soft SEM at the base, no rubs, or gallops appreciated. Abdomen: Soft, non-tender, non-distended with  normoactive bowel sounds. No hepatomegaly. No rebound/guarding. No obvious abdominal masses. Msk:  Strength and tone appear normal for age. Extremities: No clubbing or cyanosis. No edema.  Distal pedal pulses are 2+ and equal bilaterally. Neuro: Alert and oriented X 3. No facial asymmetry. No focal deficit. Moves all extremities spontaneously. Psych:  Responds to questions appropriately with a normal affect.     Assessment and Plan  57 year old man with coronary disease and advanced CKD who presents with chest pain.  His chest discomfort does have some typical features.  However given his elevated creatinine substantial chest pain burden over the last 2 days, and is quite reassuring that his initial troponin is negative.  It is not unreasonable to continue IV heparin while cycling serial biomarkers.  If his biomarkers remain negative, would likely proceed to noninvasive testing.  However, if he rules in then cath would be warranted; certainly he is at risk for progressing to dialysis if he requires a dye load.  We will plan to continue his home anti-rate anginal regimen, which includes carvedilol, ranolazine, and isosorbide mononitrate.  Continue home dual antiplatelet therapy with aspirin and Plavix.  We will follow along with you.  Signed, Doylene Canning, MD 06/17/2018, 10:34 PM

## 2018-06-17 NOTE — ED Triage Notes (Signed)
Patient reports intermittent central chest pain onset Sunday , denies SOB , no nausea or diaphoresis , history of CAD/CABG , he took 2 ASA 81 mg prior to arrival and several doses NTG spray today with no relief.

## 2018-06-18 DIAGNOSIS — I208 Other forms of angina pectoris: Secondary | ICD-10-CM

## 2018-06-18 DIAGNOSIS — G4700 Insomnia, unspecified: Secondary | ICD-10-CM

## 2018-06-18 DIAGNOSIS — I10 Essential (primary) hypertension: Secondary | ICD-10-CM

## 2018-06-18 DIAGNOSIS — N184 Chronic kidney disease, stage 4 (severe): Secondary | ICD-10-CM

## 2018-06-18 DIAGNOSIS — G8929 Other chronic pain: Secondary | ICD-10-CM

## 2018-06-18 DIAGNOSIS — D72829 Elevated white blood cell count, unspecified: Secondary | ICD-10-CM

## 2018-06-18 DIAGNOSIS — E1121 Type 2 diabetes mellitus with diabetic nephropathy: Secondary | ICD-10-CM

## 2018-06-18 DIAGNOSIS — I200 Unstable angina: Secondary | ICD-10-CM

## 2018-06-18 LAB — TROPONIN I
Troponin I: <0.03 ng/mL (ref <0.03)
Troponin I: <0.03 ng/mL (ref <0.03)

## 2018-06-18 LAB — GLUCOSE, CAPILLARY
GLUCOSE-CAPILLARY: 123 mg/dL — AB (ref 70–99)
GLUCOSE-CAPILLARY: 154 mg/dL — AB (ref 70–99)

## 2018-06-18 LAB — HEPARIN LEVEL (UNFRACTIONATED): Heparin Unfractionated: 0.69 IU/mL (ref 0.30–0.70)

## 2018-06-18 LAB — HIV ANTIBODY (ROUTINE TESTING W REFLEX): HIV Screen 4th Generation wRfx: NONREACTIVE

## 2018-06-18 MED ORDER — ATORVASTATIN CALCIUM 80 MG PO TABS
80.0000 mg | ORAL_TABLET | Freq: Every day | ORAL | Status: DC
  Administered 2018-06-18: 80 mg via ORAL
  Filled 2018-06-18: qty 1

## 2018-06-18 MED ORDER — ASPIRIN EC 81 MG PO TBEC
81.0000 mg | DELAYED_RELEASE_TABLET | Freq: Every day | ORAL | Status: DC
  Administered 2018-06-18: 81 mg via ORAL
  Filled 2018-06-18: qty 1

## 2018-06-18 MED ORDER — DIPHENHYDRAMINE HCL 25 MG PO CAPS: 25.0000 mg | ORAL_CAPSULE | Freq: Four times a day (QID) | ORAL | Status: DC | PRN

## 2018-06-18 MED ORDER — CLOPIDOGREL BISULFATE 75 MG PO TABS
75.0000 mg | ORAL_TABLET | Freq: Every day | ORAL | Status: DC
  Administered 2018-06-18: 75 mg via ORAL
  Filled 2018-06-18: qty 1

## 2018-06-18 MED ORDER — VITAMIN E 180 MG (400 UNIT) PO CAPS
400.0000 [IU] | ORAL_CAPSULE | Freq: Every day | ORAL | Status: DC
  Administered 2018-06-18: 400 [IU] via ORAL
  Filled 2018-06-18: qty 1

## 2018-06-18 MED ORDER — HYDRALAZINE HCL 50 MG PO TABS
50.0000 mg | ORAL_TABLET | Freq: Three times a day (TID) | ORAL | Status: DC
  Administered 2018-06-18: 50 mg via ORAL
  Filled 2018-06-18: qty 1

## 2018-06-18 MED ORDER — VITAMIN D 1000 UNITS PO TABS
2000.0000 [IU] | ORAL_TABLET | Freq: Every day | ORAL | Status: DC
  Administered 2018-06-18: 2000 [IU] via ORAL
  Filled 2018-06-18: qty 2

## 2018-06-18 MED ORDER — NITROGLYCERIN 0.4 MG SL SUBL: 0.4000 mg | SUBLINGUAL_TABLET | SUBLINGUAL | Status: DC | PRN

## 2018-06-18 MED ORDER — FAMOTIDINE 20 MG PO TABS
10.0000 mg | ORAL_TABLET | Freq: Every day | ORAL | Status: DC
  Filled 2018-06-18: qty 1

## 2018-06-18 MED ORDER — ALLOPURINOL 100 MG PO TABS
100.0000 mg | ORAL_TABLET | Freq: Every day | ORAL | Status: DC
  Administered 2018-06-18: 100 mg via ORAL
  Filled 2018-06-18: qty 1

## 2018-06-18 MED ORDER — GABAPENTIN 300 MG PO CAPS
300.0000 mg | ORAL_CAPSULE | Freq: Two times a day (BID) | ORAL | Status: DC
  Administered 2018-06-18: 300 mg via ORAL
  Filled 2018-06-18: qty 1

## 2018-06-18 MED ORDER — INFLUENZA VAC SPLIT QUAD 0.5 ML IM SUSY: 0.5000 mL | PREFILLED_SYRINGE | INTRAMUSCULAR | Status: DC

## 2018-06-18 MED ORDER — AMLODIPINE BESYLATE 10 MG PO TABS
10.0000 mg | ORAL_TABLET | Freq: Every day | ORAL | Status: DC
  Administered 2018-06-18: 10 mg via ORAL
  Filled 2018-06-18: qty 1

## 2018-06-18 MED ORDER — ISOSORBIDE MONONITRATE ER 60 MG PO TB24
120.0000 mg | ORAL_TABLET | Freq: Two times a day (BID) | ORAL | Status: DC
  Administered 2018-06-18: 120 mg via ORAL
  Filled 2018-06-18: qty 2

## 2018-06-18 MED ORDER — ZOLPIDEM TARTRATE 5 MG PO TABS: 10.0000 mg | ORAL_TABLET | Freq: Every evening | ORAL | Status: DC | PRN

## 2018-06-18 MED ORDER — CARVEDILOL 25 MG PO TABS
50.0000 mg | ORAL_TABLET | Freq: Two times a day (BID) | ORAL | Status: DC
  Administered 2018-06-18: 50 mg via ORAL
  Filled 2018-06-18: qty 2

## 2018-06-18 MED ORDER — TRAMADOL HCL 50 MG PO TABS: 50.0000 mg | ORAL_TABLET | Freq: Four times a day (QID) | ORAL | Status: DC | PRN

## 2018-06-18 MED ORDER — PANTOPRAZOLE SODIUM 40 MG PO TBEC
40.0000 mg | DELAYED_RELEASE_TABLET | Freq: Every day | ORAL | Status: DC
  Administered 2018-06-18: 40 mg via ORAL
  Filled 2018-06-18: qty 1

## 2018-06-18 MED ORDER — ADULT MULTIVITAMIN W/MINERALS CH
1.0000 | ORAL_TABLET | Freq: Every day | ORAL | Status: DC
  Administered 2018-06-18: 1 via ORAL
  Filled 2018-06-18: qty 1

## 2018-06-18 MED ORDER — ACETAMINOPHEN 500 MG PO TABS: 500.0000 mg | ORAL_TABLET | Freq: Three times a day (TID) | ORAL | Status: DC | PRN

## 2018-06-18 MED ORDER — RANOLAZINE ER 500 MG PO TB12
500.0000 mg | ORAL_TABLET | Freq: Two times a day (BID) | ORAL | Status: DC
  Administered 2018-06-18: 500 mg via ORAL
  Filled 2018-06-18: qty 1

## 2018-06-18 MED ORDER — INSULIN ASPART 100 UNIT/ML ~~LOC~~ SOLN
0.0000 [IU] | Freq: Three times a day (TID) | SUBCUTANEOUS | Status: DC
  Administered 2018-06-18: 1 [IU] via SUBCUTANEOUS

## 2018-06-18 NOTE — Care Management CC44 (Signed)
Condition Code 44 Documentation Completed  Patient Details  Name: Matthew PIECHOTA Sr. MRN: [id number] Date of Birth: 09-19-1960   Condition Code 44 given:  Yes Patient signature on Condition Code 44 notice:  Yes Documentation of 2 MD's agreement:  Yes Code 44 added to claim:  Yes    Bethena Roys, RN 06/18/2018, 12:24 PM

## 2018-06-18 NOTE — H&P (Addendum)
History and Physical    Matthew Rowe:[id number] DOB: November 11, 1960 DOA: 06/17/2018  PCP: [name], MD Patient coming from: Home  Chief Complaint: Chest pain  HPI: Matthew Rowe. is a 57 y.o. male with medical history significant of chronic combined systolic and diastolic congestive heart failure, hypertension, coronary artery disease status post CABG and multiple PCI's, chronic angina, CKD stage IV presenting to the hospital for further evaluation of chest pain.  Patient reports having chest pain both with exertion and at rest for the past 2 days.  Chest pain is left-sided, 10 out of 10 intensity, pressure-like, and nonradiating.  Each episode lasts 20 to 30 minutes and is alleviated with nitroglycerin spray.  Denies having any associated shortness of breath, palpitations, or nausea.   ED Course: Vitals stable on arrival.  I-STAT troponin negative.  EKG without acute ischemic changes.  BNP 181.  Chest x-ray without acute abnormality.  IV heparin was started for presumed unstable coronary syndrome.  Patient was seen by cardiology and [place] patient to admit.   Review of Systems: As per HPI otherwise 10 point review of systems negative.  Past Medical History:  Diagnosis Date  . Acute on chronic combined systolic and diastolic CHF (congestive heart failure) ([place]) 10/15/2017  . Chronic anemia   . Chronic combined systolic and diastolic CHF (congestive heart failure) ([place])   . Chronic stable angina ([place])   . CKD (chronic kidney disease), stage IV ([place])   . Coronary artery disease    a. INF MI 1999: PCI of RCA;  b.  extensive stenting of LAD;  c. s/p CABG with RIMA->RCA in 2006;  d. Stable, Low risk MV 05/2012; e. 09/2012 Cath: stable anatomy->med Rx. f. Abnl nuc 11/2013 -> med rx; g. 11/2013 Cath/PCI: s/p DES to RCA. h. Abnormal nuc 11/2016, pt declined cath.   . DM2 (diabetes mellitus, type 2) ([place])   . Dyslipidemia   . GERD (gastroesophageal reflux disease)   . History of gout   .  Hypertension   . Hypertensive heart disease   . Ischemic cardiomyopathy   . Mild aortic stenosis   . Myocardial infarction [facility]) 1999; 2002; 2003; 2006; ~ 2008  . OSA on CPAP    wears CPAP  . Polycystic kidney disease     Past Surgical History:  Procedure Laterality Date  . AV FISTULA PLACEMENT Left 05/29/2018   Procedure: ARTERIOVENOUS (AV) FISTULA CREATION  LEFT ARM.;  Surgeon: Serafina Mitchell, MD;  Location: [place];  Service: Vascular;  Laterality: Left;  . CARDIAC CATHETERIZATION  2012  . CARDIAC CATHETERIZATION  2013  . CARDIAC CATHETERIZATION  2014   LHC (1/14): total occlusion of distal LAD, diffuse disease in ramus, 70% proximal LCx, 50% proximal and mid RCA, 50% distal RCA, patent RIMA-RCA.  Marland Kitchen CORONARY ANGIOPLASTY WITH STENT PLACEMENT  1999 / 2002 / 2004 / 2006?  . CORONARY ARTERY BYPASS GRAFT  2006   CABG X?; in [place]  . CYSTECTOMY  1990's   off face  . HERNIA REPAIR     inguinal hernia  . LEFT HEART CATHETERIZATION WITH CORONARY/GRAFT ANGIOGRAM N/A 09/16/2012   Procedure: LEFT HEART CATHETERIZATION WITH [name];  Surgeon: [name], MD;  Location: [facility] CATH LAB;  Service: Cardiovascular;  Laterality: N/A;  . LEFT HEART CATHETERIZATION WITH CORONARY/GRAFT ANGIOGRAM N/A 12/02/2013   Procedure: LEFT HEART CATHETERIZATION WITH [name];  Surgeon: [name], MD;  Location: [facility] CATH LAB;  Service: Cardiovascular;  Laterality: N/A;  .  PERCUTANEOUS CORONARY STENT INTERVENTION (PCI-S) N/A 12/03/2013   Procedure: PERCUTANEOUS CORONARY STENT INTERVENTION (PCI-S);  Surgeon: [name], MD;  Location: [facility] CATH LAB;  Service: Cardiovascular;  Laterality: N/A;     reports that he has quit smoking. His smoking use included cigarettes. He has never used smokeless tobacco. He reports that he does not drink alcohol or use drugs.  Allergies  Allergen Reactions  . Cleocin [Clindamycin Hcl] Anaphylaxis and Swelling  . Glimepiride [Amaryl]  Other (See Comments)    Elevates liver function   . Clarithromycin Itching and Other (See Comments)    "Biaxin" Eyes itch and burn  . Colchicine Other (See Comments)    Affected kidneys   . Ciprocin-Fluocin-Procin [Fluocinolone Acetonide] Rash    Family History  Problem Relation Age of Onset  . Lung cancer Mother   . Anemia Mother   . Polycystic kidney disease Mother   . Diabetes Father   . Heart attack Father 55       Died suddenly  . Heart failure Father   . Hyperlipidemia Father   . Hypertension Father   . Kidney failure Sister 5       Died  . Heart attack Sister   . Hypertension Sister   . Diabetes Sister   . Diabetes Brother   . Polycystic kidney disease Brother   . Diabetes Sister     Prior to Admission medications   Medication Sig Start Date End Date Taking? Authorizing Provider  acetaminophen (TYLENOL) 500 MG tablet Take 500 mg by mouth 3 (three) times daily as needed (for pain or headaches).    Yes [provider]  allopurinol (ZYLOPRIM) 100 MG tablet Take 100 mg by mouth daily.    Yes [provider]  amLODipine (NORVASC) 10 MG tablet Take 1 tablet (10 mg total) by mouth daily. 01/12/15  Yes Copland, Gay Filler, MD  aspirin EC 81 MG EC tablet Take 1 tablet (81 mg total) by mouth daily. 10/19/17  Yes Kilroy, Luke K, PA-C  atorvastatin (LIPITOR) 80 MG tablet Take 1 tablet (80 mg total) by mouth daily. 08/26/17  Yes Dunn, Dayna N, PA-C  carvedilol (COREG) 25 MG tablet Take 2 tablets (50 mg total) by mouth 2 (two) times daily. 06/06/18  Yes End, Harrell Gave, MD  Cholecalciferol 2000 units TABS Take 2,000 Units by mouth daily.    Yes [provider]  clopidogrel (PLAVIX) 75 MG tablet TAKE ONE TABLET BY MOUTH ONCE DAILY WITH BREAKFAST Patient taking differently: Take 75 mg by mouth daily.  12/25/16  Yes End, Harrell Gave, MD  diphenhydrAMINE (BENADRYL) 25 MG tablet Take 25 mg by mouth every 6 (six) hours as needed for allergies or sleep.    Yes  [provider]  furosemide (LASIX) 40 MG tablet Take 1 tablet (40 mg total) by mouth 2 (two) times daily. 12/27/17  Yes End, Harrell Gave, MD  gabapentin (NEURONTIN) 300 MG capsule Take 1 capsule (300 mg total) by mouth 2 (two) times daily. 04/03/17  Yes Eulogio Bear U, DO  hydrALAZINE (APRESOLINE) 50 MG tablet Take 1 tablet (50 mg total) by mouth 3 (three) times daily. 12/20/16  Yes End, Harrell Gave, MD  Insulin NPH Isophane & Regular (NOVOLIN 70/30 [place]) Inject 20 Units into the skin 2 (two) times daily.    Yes [provider]  isosorbide mononitrate (IMDUR) 120 MG 24 hr tablet Take 1 tablet (120 mg total) by mouth 2 (two) times daily. 08/26/17  Yes Dunn, Nedra Hai, PA-C  Multiple Vitamin (  MULTIVITAMIN WITH MINERALS) TABS tablet Take 1 tablet by mouth daily.   Yes [provider]  nitroGLYCERIN (NITROSTAT) 0.4 MG SL tablet Place 1 tablet (0.4 mg total) under the tongue every 5 (five) minutes as needed for chest pain. 06/06/18  Yes End, Harrell Gave, MD  pantoprazole (PROTONIX) 40 MG tablet Take 40 mg by mouth daily.    Yes [provider]  ranitidine (ZANTAC) 300 MG tablet Take 300 mg by mouth at bedtime.   Yes [provider]  ranolazine (RANEXA) 500 MG 12 hr tablet Take 1 tablet (500 mg total) by mouth 2 (two) times daily. 03/29/17  Yes End, Harrell Gave, MD  Semaglutide [facility]) Inject 2.5 Units into the skin every Saturday.    Yes [provider]  traMADol (ULTRAM) 50 MG tablet Take 1 tablet (50 mg total) by mouth every 6 (six) hours as needed for moderate pain. 05/29/18  Yes Ulyses Amor, PA-C  vitamin E 100 UNIT capsule Take 400 Units by mouth daily.   Yes [provider]  zolpidem (AMBIEN) 10 MG tablet Take 10 mg by mouth at bedtime as needed for sleep.  09/30/17  Yes [provider]  [place]. Devices MISC by Does not apply route. C-PAP    [provider]    Physical Exam: Vitals:   06/17/18 1922 06/17/18 2139  06/17/18 2300 06/17/18 2352  BP: 135/82  136/80 139/81  Pulse: 99  87   Resp: 18  (!) 24   Temp: 98.1 F (36.7 C)   98 F (36.7 C)  TempSrc: Oral   Oral  SpO2: 100%  96% 100%  Weight:  108 kg  103 kg  Height:  5' 5.5" (1.664 m)  5\' 5"  (1.651 m)   Physical Exam  Constitutional: He is oriented to person, place, and time. He appears well-developed and well-nourished. No distress.  HENT:  Head: Normocephalic and atraumatic.  Mouth/Throat: Oropharynx is clear and moist.  Eyes: Right eye exhibits no discharge. Left eye exhibits no discharge.  Neck: Neck supple. No tracheal deviation present.  Cardiovascular: Normal rate, regular rhythm and intact distal pulses.  Pulmonary/Chest: Effort normal and breath sounds normal. No respiratory distress. He has no wheezes. He has no rales.  Abdominal: Soft. Bowel sounds are normal. There is no tenderness.  Musculoskeletal: He exhibits no edema.  Left upper extremity AV fistula: No thrill or bruit appreciated  Neurological: He is alert and oriented to person, place, and time.  Skin: Skin is warm and dry.  Psychiatric: He has a normal mood and affect.     Labs on Admission: I have personally reviewed following labs and imaging studies  CBC: Recent Labs  Lab 06/17/18 1934  WBC 10.7*  HGB 10.8*  HCT 34.8*  MCV 92.6  PLT 169   Basic Metabolic Panel: Recent Labs  Lab 06/17/18 1934  NA 136  K 4.1  CL 104  CO2 23  GLUCOSE 186*  BUN 45*  CREATININE 3.89*  CALCIUM 9.1   GFR: Estimated Creatinine Clearance: 22.9 mL/min (A) (by C-G formula based on SCr of 3.89 mg/dL (H)). Liver Function Tests: No results for input(s): AST, ALT, ALKPHOS, BILITOT, PROT, ALBUMIN in the last 168 hours. No results for input(s): LIPASE, AMYLASE in the last 168 hours. No results for input(s): AMMONIA in the last 168 hours. Coagulation Profile: Recent Labs  Lab 06/17/18 1934  INR 0.96   Cardiac Enzymes: No results for input(s): CKTOTAL, CKMB, CKMBINDEX,  TROPONINI in the last 168 hours.  BNP (last 3 results) No results for input(s): PROBNP in the last 8760 hours. HbA1C: No results for input(s): HGBA1C in the last 72 hours. CBG: No results for input(s): GLUCAP in the last 168 hours. Lipid Profile: No results for input(s): CHOL, HDL, LDLCALC, TRIG, CHOLHDL, LDLDIRECT in the last 72 hours. Thyroid Function Tests: No results for input(s): TSH, T4TOTAL, FREET4, T3FREE, THYROIDAB in the last 72 hours. Anemia Panel: No results for input(s): VITAMINB12, FOLATE, FERRITIN, TIBC, IRON, RETICCTPCT in the last 72 hours. Urine analysis:    Component Value Date/Time   COLORURINE YELLOW 03/31/2017 2014   APPEARANCEUR CLEAR 03/31/2017 2014   LABSPEC 1.015 03/31/2017 2014   PHURINE 5.0 03/31/2017 2014   GLUCOSEU NEGATIVE 03/31/2017 2014   HGBUR NEGATIVE 03/31/2017 2014   [place] NEGATIVE 03/31/2017 2014   [place] 03/31/2017 2014   PROTEINUR 100 (A) 03/31/2017 2014   UROBILINOGEN 1.0 07/17/2015 1425   NITRITE NEGATIVE 03/31/2017 2014   LEUKOCYTESUR NEGATIVE 03/31/2017 2014    Radiological Exams on Admission: Dg Chest 2 View  Result Date: 06/17/2018 CLINICAL DATA:  Chest pain. EXAM: CHEST - 2 VIEW COMPARISON:  Radiograph of October 16, 2017. FINDINGS: Stable cardiomediastinal silhouette. Sternotomy wires are noted. No pneumothorax or pleural effusion is noted. Both lungs are clear. The visualized skeletal structures are unremarkable. IMPRESSION: No active cardiopulmonary disease. Electronically Signed   By: Marijo Conception, M.D.   On: 06/17/2018 20:27    EKG: Independently reviewed.  Sinus rhythm; no acute ischemic changes.  Assessment/Plan Principal Problem:   Unstable angina (HCC) Active Problems:   Hypertension   Type 2 diabetes with nephropathy (HCC)   HLD (hyperlipidemia)   CKD (chronic kidney disease) stage 4, GFR 15-29 ml/min (HCC)   Anemia due to stage 4 chronic kidney disease (HCC)   Leukocytosis   Chronic pain    Insomnia   Unstable angina Prior history of coronary artery disease that is post CABG and multiple PCI's.  Vitals stable on arrival.  I-STAT troponin negative.  EKG without acute ischemic changes. Chest x-ray without acute abnormality.  IV heparin was started for presumed unstable coronary syndrome.  Patient was seen by cardiology (Dr. A[place] Lions) and he recommended continuing IV heparin and cycling serial biomarkers at this time.  If his biomarkers remain negative, cardiology will likely proceed to a noninvasive testing.  However, if biomarkers elevated then cath would be warranted. -Continue to trend troponin -Aspirin -Sublingual nitroglycerin as needed -Continue home anti-anginal regimen carvedilol, ranolazine, and isosorbide mononitrate -Continue home dual antiplatelet therapy with aspirin and Plavix -N.p.o. after midnight  Mild leukocytosis White count 10.7.  Likely stress reaction.  Patient is afebrile.  Chest x-ray without signs of infection. -Continue to monitor; CBC in a.m.  Hypertension Currently normotensive. -Continue home hydralazine, carvedilol, isosorbide mononitrate, and amlodipine   Chronic combined systolic and diastolic congestive heart failure Stable.  Currently not volume overloaded. -Continue home carvedilol and isosorbide mononitrate -Hold home Lasix at this time  Hyperlipidemia -Continue home atorvastatin  Anemia of chronic disease Hemoglobin 10.8 (at baseline). -Continue to monitor; CBC in a.m.  CKD stage IV Creatinine 3.8 (at baseline). -Continue to monitor; BMP in a.m.  Gout -Continue allopurinol   Type 2 diabetes A1c 7.0 two weeks ago. -Sliding scale insulin sensitive -CBG checks  Chronic pain/neuropathy -Continue gabapentin  -Continue home tramadol  Insomnia -Continue home Ambien prn  DVT prophylaxis: Heparin Code Status: Patient wishes to be full code. Family Communication: Wife at bedside updated. Disposition Plan: Anticipate  discharge in  1 to 2 days to home. Consults called: Cardiology - Dr. [place] Lions Admission status: Inpatient.  Patient needs IV heparin.  Anticipate further cardiac testing tomorrow.  Shela Leff MD [facility] Pager [phone]  If 7PM-7AM, please contact night-coverage [email or web address] Password [email or web address]  06/18/2018, 1:11 AM

## 2018-06-18 NOTE — Care Management Obs Status (Signed)
[place] NOTIFICATION   Patient Details  Name: Matthew MATHIESON Sr. MRN: [id number] Date of Birth: 1961-05-19   Medicare Observation Status Notification Given:  Yes    Bethena Roys, RN 06/18/2018, 12:23 PM

## 2018-06-18 NOTE — Progress Notes (Signed)
Placed patient on CPAP via FFM, auto settings (max 20, min 6).

## 2018-06-18 NOTE — Progress Notes (Addendum)
[place] for heparin Indication: chest pain/ACS  Allergies  Allergen Reactions  . Cleocin [Clindamycin Hcl] Anaphylaxis and Swelling  . Glimepiride [Amaryl] Other (See Comments)    Elevates liver function   . Clarithromycin Itching and Other (See Comments)    "Biaxin" Eyes itch and burn  . Colchicine Other (See Comments)    Affected kidneys   . Ciprocin-Fluocin-Procin [Fluocinolone Acetonide] Rash    Patient Measurements: Height: 5\' 5"  (165.1 cm) Weight: 227 lb 1.6 oz (103 kg) IBW/kg (Calculated) : 61.5 Heparin Dosing Weight: 87.2 kg  Vital Signs: Temp: 98 F (36.7 C) (10/08 2352) Temp Source: Oral (10/08 2352) BP: 139/81 (10/08 2352) Pulse Rate: 87 (10/08 2300)  Labs: Recent Labs    06/17/18 1934 06/18/18 0332  HGB 10.8*  --   HCT 34.8*  --   PLT 251  --   LABPROT 12.6  --   INR 0.96  --   CREATININE 3.89*  --   TROPONINI  --  <0.03    Estimated Creatinine Clearance: 22.9 mL/min (A) (by C-G formula based on SCr of 3.89 mg/dL (H)).   Medical History: Past Medical History:  Diagnosis Date  . Acute on chronic combined systolic and diastolic CHF (congestive heart failure) ([place]) 10/15/2017  . Chronic anemia   . Chronic combined systolic and diastolic CHF (congestive heart failure) ([place])   . Chronic stable angina ([place])   . CKD (chronic kidney disease), stage IV ([place])   . Coronary artery disease    a. INF MI 1999: PCI of RCA;  b.  extensive stenting of LAD;  c. s/p CABG with RIMA->RCA in 2006;  d. Stable, Low risk MV 05/2012; e. 09/2012 Cath: stable anatomy->med Rx. f. Abnl nuc 11/2013 -> med rx; g. 11/2013 Cath/PCI: s/p DES to RCA. h. Abnormal nuc 11/2016, pt declined cath.   . DM2 (diabetes mellitus, type 2) ([place])   . Dyslipidemia   . GERD (gastroesophageal reflux disease)   . History of gout   . Hypertension   . Hypertensive heart disease   . Ischemic cardiomyopathy   . Mild aortic stenosis   . Myocardial infarction [facility])  1999; 2002; 2003; 2006; ~ 2008  . OSA on CPAP    wears CPAP  . Polycystic kidney disease     Medications:  Scheduled:  . allopurinol  100 mg Oral Daily  . amLODipine  10 mg Oral Daily  . aspirin EC  81 mg Oral Daily  . atorvastatin  80 mg Oral Daily  . carvedilol  50 mg Oral BID WC  . cholecalciferol  2,000 Units Oral Daily  . clopidogrel  75 mg Oral Daily  . famotidine  10 mg Oral Daily  . gabapentin  300 mg Oral BID  . hydrALAZINE  50 mg Oral TID  . [START ON 06/19/2018] Influenza vac split quadrivalent PF  0.5 mL Intramuscular Tomorrow-1000  . insulin aspart  0-9 Units Subcutaneous TID WC  . isosorbide mononitrate  120 mg Oral BID  . multivitamin with minerals  1 tablet Oral Daily  . pantoprazole  40 mg Oral Daily  . ranolazine  500 mg Oral BID  . vitamin E  400 Units Oral Daily   Infusions:  . heparin 1,200 Units/hr (06/17/18 2232)    Assessment: Pt is 86 YOM presenting with intermittent chest pain since Sunday. PMH is significant for CAD s/p CABG and multiple PCI's, recurrent angina, DM, HTN, HLD, CKD, CHF, and gout. Pharmacy consulted for heparin dosing  for ACS/chest pain. No PTA anticoagulation; negative troponins.  Heparin level therapeutic at 0.69 but near top of range. Hg 10.8, plt 251 on admit - CBC not yet resulted today. No bleeding or issues with infusion per discussion with RN.  Goal of Therapy:  Heparin level 0.3-0.7 units/ml Monitor platelets by anticoagulation protocol: Yes   Plan:  Decrease heparin gtt slightly to 1150 units/hr to ensure stays in range Monitor daily heparin level and CBC, s/sx bleeding F/u Cardiology plans  Elicia Lamp, PharmD, BCPS Clinical Pharmacist Clinical phone [phone] Please check AMION for all [place] contact numbers 06/18/2018 8:37 AM

## 2018-06-18 NOTE — Progress Notes (Signed)
Progress Note  Patient Name: Matthew GRAU Sr. Date of Encounter: 06/18/2018  Primary Cardiologist: Nelva Bush, MD (Also gets care at [facility])  Subjective   Currently CP free. No dyspnea. Resting comfortably.   Inpatient Medications    Scheduled Meds: . allopurinol  100 mg Oral Daily  . amLODipine  10 mg Oral Daily  . aspirin EC  81 mg Oral Daily  . atorvastatin  80 mg Oral Daily  . carvedilol  50 mg Oral BID WC  . cholecalciferol  2,000 Units Oral Daily  . clopidogrel  75 mg Oral Daily  . famotidine  10 mg Oral Daily  . gabapentin  300 mg Oral BID  . hydrALAZINE  50 mg Oral TID  . [START ON 06/19/2018] Influenza vac split quadrivalent PF  0.5 mL Intramuscular Tomorrow-1000  . insulin aspart  0-9 Units Subcutaneous TID WC  . isosorbide mononitrate  120 mg Oral BID  . multivitamin with minerals  1 tablet Oral Daily  . pantoprazole  40 mg Oral Daily  . ranolazine  500 mg Oral BID  . vitamin E  400 Units Oral Daily   Continuous Infusions: . heparin 1,200 Units/hr (06/17/18 2232)   PRN Meds: acetaminophen, diphenhydrAMINE, nitroGLYCERIN, traMADol, zolpidem   Vital Signs    Vitals:   06/17/18 1922 06/17/18 2139 06/17/18 2300 06/17/18 2352  BP: 135/82  136/80 139/81  Pulse: 99  87   Resp: 18  (!) 24   Temp: 98.1 F (36.7 C)   98 F (36.7 C)  TempSrc: Oral   Oral  SpO2: 100%  96% 100%  Weight:  108 kg  103 kg  Height:  5' 5.5" (1.664 m)  5\' 5"  (1.651 m)   No intake or output data in the 24 hours ending 06/18/18 0849 Filed Weights   06/17/18 2139 06/17/18 2352  Weight: 108 kg 103 kg    Telemetry    NSR - Personally Reviewed  ECG    No acute changes, SR w/ Nonspecific IVCD with LAD - Personally Reviewed  Physical Exam   GEN: moderately obese AAM in No acute distress.   Neck: No JVD Cardiac: RRR, no murmurs, rubs, or gallops.  Respiratory: Clear to auscultation bilaterally. GI: Soft, nontender, non-distended  MS: No edema; No  deformity. Neuro:  Nonfocal  Psych: Normal affect   Labs    Chemistry Recent Labs  Lab 06/17/18 1934  NA 136  K 4.1  CL 104  CO2 23  GLUCOSE 186*  BUN 45*  CREATININE 3.89*  CALCIUM 9.1  GFRNONAA 16*  GFRAA 18*  ANIONGAP 9     Hematology Recent Labs  Lab 06/17/18 1934  WBC 10.7*  RBC 3.76*  HGB 10.8*  HCT 34.8*  MCV 92.6  MCH 28.7  MCHC 31.0  RDW 14.3  PLT 251    Cardiac Enzymes Recent Labs  Lab 06/18/18 0332  TROPONINI <0.03    Recent Labs  Lab 06/17/18 1949  TROPIPOC 0.00     BNP Recent Labs  Lab 06/17/18 1934  BNP 181.8*     DDimer No results for input(s): DDIMER in the last 168 hours.   Radiology    Dg Chest 2 View  Result Date: 06/17/2018 CLINICAL DATA:  Chest pain. EXAM: CHEST - 2 VIEW COMPARISON:  Radiograph of October 16, 2017. FINDINGS: Stable cardiomediastinal silhouette. Sternotomy wires are noted. No pneumothorax or pleural effusion is noted. Both lungs are clear. The visualized skeletal structures are unremarkable. IMPRESSION: No active cardiopulmonary  disease. Electronically Signed   By: Marijo Conception, M.D.   On: 06/17/2018 20:27    Cardiac Studies   Cath/PCI:  LHC (12/02/13): LMCA with 30% distal stenosis. LAD with multiple stents and 50% ostial stenosis followed by 20% diffuse in-stent restenosis proximally. There is diffuse 50% in-stent restenosis in the mid and distal segments. The distal LAD is occluded. LCx is small with 40-50% ostial stenosis. OM 2 has a 50% ostial stenosis. Ramus intermedius is a large vessel with 70-80% stenosis involving its superior branch. 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 There is diffuse 20% proximal disease. There is a 90% stenosis involving the distal vessel just beyond the anastomosis with the RIMA. 60% stenosis extending into the RPDA is also evident. RIMA to RCA is widely patent with aforementioned stenosis just beyond the distal anastomosis.  PCI (12/03/13): Successful PCI  to the mid/distal RCA with overlapping Promus drug-eluting stents (2.25 x 24 and 2.25 x 16 mm).  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  EP Procedures and Devices:  None  Non-Invasive Evaluation(s):  TTE (10/16/17): Normal LV size with moderate LVH. LVEF 55-60% with akinesis of the apical anteroseptal and apical segments. Grade 2 diastolic dysfunction. Mild aortic regurgitation. Mild to moderate mitral regurgitation. Biatrial enlargement and mild to moderate TR. Normal RV size and function.  Transthoracic echocardiogram (12/18/16): Normal LV size with moderate LVH. LVEF mildly reduced at 45-50% with anterior, apical septal, apical, and apical inferoapical akinesis. Grade 1 diastolic dysfunction. Trileaflet aortic valve with mild stenosis (mean gradient 14 mmHg). Moderately dilated left ventricle. Mildly dilated right ventricle with mildly reduced contraction. Normal pulmonary artery and central venous pressure.  Pharmacologic myocardial perfusion stress test (11/16/16): High risk study with large fixed inferior defect extending to the apex. Dilated left ventricle with akinesis of the inferior wall. LVEF 35%.  Transthoracic echocardiogram (12/18/16): Normal LV size with moderate LVH. LVEF mildly reduced (45-50%). There is anterior and apical akinesis suggestive of LAD disease. Mild aortic stenosis noted with a mean gradient of 14 mmHg. Left atrium moderately dilated. Right ventricle mildly dilated and mildly reduced contraction.  Pharmacologic myocardial perfusion stress test (11/16/16): Large fixed differential defect involving the inferior wall and apex consistent with scar. LVEF 35% with inferior akinesis.  Transthoracic echocardiogram (01/02/15): Normal LV size with mild LVH. LVEF 50-55% with normal wall motion and grade 1 diastolic dysfunction. Very mild aortic stenosis. Mitral and or calcium patient with mild leaflet thickening and  regurgitation. Normal RV size and function.  Pharmacologic myocardial perfusion stress test (11/28/13): Large, partially reversible inferolateral defect. Fixed apical defect. LVEF 54%.  Patient Profile     Matthew Rowe. is a 57 y.o. 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  The patient does have chronic angina, although this is been relatively well controlled with Imdur and ranolazine.  Approximately 2 days ago he noticed new onset chest pain that is waxing and waning, worsens with exertion and responsive to nitroglycerin.  Given ongoing chest discomfort, he presented to the [facility] ED for evaluation. Of note, he is being worked up for renal transplant at the [place].  He has a stress echocardiogram scheduled there on 10/10 as part of the routine transplant evaluation.   Assessment &  Plan   1. CAD/ Chest Pain: extensive coronary history as outlined above. Has chronic angina, which has been controlled with Ranexa and Imdur, but new breakthrough pain over the last few days. Also advanced CKD undergoing w/u for renal transplant at the [place], thus would be very high risk for progression to end-stage renal disease with likely need for HD if contrast exposure. Thus pt would like to avoid cardiac cath, if at all possible. He is currently CP free. No pain overnight. He has had 2 negative troponin's thus far. 3rd troponin pending. EKG shows no acute changes. If he completely rules out with negative 3rd troponin, we can d/c home and have him to continue with stress test, as originally planned, at the [facility] clinic tomorrow for further evaluation.   For questions or updates, please contact [place] Please consult [email or web address] for contact info under        Signed, Lyda Jester, PA-C  06/18/2018, 8:49  AM

## 2018-06-18 NOTE — Discharge Summary (Signed)
Physician Discharge Summary  Matthew Rowe RJJ:[id number] DOB: 22-Feb-1961 DOA: 06/17/2018  PCP: [name], MD  Admit date: 06/17/2018 Discharge date: 06/18/2018  Admitted From: Home Disposition:  Home  Recommendations for Outpatient Follow-up:  1. Follow up with PCP in 1-2 weeks 2. Follow up with Cardiology as scheduled  Discharge Condition:Stable CODE STATUS:Full Diet recommendation: Heart healthy   Brief/Interim Summary: 57 y.o. male with medical history significant of chronic combined systolic and diastolic congestive heart failure, hypertension, coronary artery disease status post CABG and multiple PCI's, chronic angina, CKD stage IV presenting to the hospital for further evaluation of chest pain.  Patient reports having chest pain both with exertion and at rest for the past 2 days.  Chest pain is left-sided, 10 out of 10 intensity, pressure-like, and nonradiating.  Each episode lasts 20 to 30 minutes and is alleviated with nitroglycerin spray.  Denies having any associated shortness of breath, palpitations, or nausea.   ED Course: Vitals stable on arrival.  I-STAT troponin negative.  EKG without acute ischemic changes.  BNP 181.  Chest x-ray without acute abnormality.  IV heparin was started for presumed unstable coronary syndrome.  Patient was seen by cardiology and [place] patient to admit.   Chest pain Prior history of coronary artery disease that is post CABG and multiple PCI's.  Vitals stable on arrival.  I-STAT troponin negative.  EKG without acute ischemic changes. Chest x-ray without acute abnormality.  IV heparin was started for presumed unstable coronary syndrome.  Patient was seen by cardiology (Dr. [place] Lions) and he recommended continuing IV heparin and cycling serial biomarkers at this time.   -Trop remained serially stable. -Cardiology recommendation for follow up stress test as already planned -No further chest pain  Mild leukocytosis White count 10.7.   Likely stress reaction.  Patient is afebrile.  Chest x-ray without signs of infection.  Hypertension Currently normotensive. -Continue home hydralazine, carvedilol, isosorbide mononitrate, and amlodipine   Chronic combined systolic and diastolic congestive heart failure Stable.  Currently not volume overloaded. -Continue home carvedilol and isosorbide mononitrate  Hyperlipidemia -Continue home atorvastatin  Anemia of chronic disease Hemoglobin 10.8 (at baseline). -Continue to monitor  CKD stage IV Creatinine 3.8 (at baseline). -Continue to monitor  Gout -Continue allopurinol   Type 2 diabetes A1c 7.0 two weeks ago prior to hospital visit. -Sliding scale insulin sensitive  Chronic pain/neuropathy -Continued on gabapentin  -Continue home tramadol  Insomnia -Continued home Ambien prn while in hospital   Discharge Diagnoses:  Principal Problem:   Unstable angina ([facility]) Active Problems:   Hypertension   Type 2 diabetes with nephropathy (HCC)   HLD (hyperlipidemia)   CKD (chronic kidney disease) stage 4, GFR 15-29 ml/min (HCC)   Anemia due to stage 4 chronic kidney disease (HCC)   Leukocytosis   Chronic pain   Insomnia    Discharge Instructions   Allergies as of 06/18/2018      Reactions   Cleocin [clindamycin Hcl] Anaphylaxis, Swelling   Glimepiride [amaryl] Other (See Comments)   Elevates liver function   Clarithromycin Itching, Other (See Comments)   "Biaxin" Eyes itch and burn   Colchicine Other (See Comments)   Affected kidneys   Ciprocin-fluocin-procin [fluocinolone Acetonide] Rash      Medication List    TAKE these medications   acetaminophen 500 MG tablet Commonly known as:  TYLENOL Take 500 mg by mouth 3 (three) times daily as needed (for pain or headaches).   allopurinol 100 MG tablet Commonly known as:  ZYLOPRIM Take 100 mg by mouth daily.   amLODipine 10 MG tablet Commonly known as:  NORVASC Take 1 tablet (10 mg total) by  mouth daily.   aspirin 81 MG EC tablet Take 1 tablet (81 mg total) by mouth daily.   atorvastatin 80 MG tablet Commonly known as:  LIPITOR Take 1 tablet (80 mg total) by mouth daily.   carvedilol 25 MG tablet Commonly known as:  COREG Take 2 tablets (50 mg total) by mouth 2 (two) times daily.   Cholecalciferol 2000 units Tabs Take 2,000 Units by mouth daily.   clopidogrel 75 MG tablet Commonly known as:  PLAVIX TAKE ONE TABLET BY MOUTH ONCE DAILY WITH BREAKFAST What changed:  See the new instructions.   diphenhydrAMINE 25 MG tablet Commonly known as:  BENADRYL Take 25 mg by mouth every 6 (six) hours as needed for allergies or sleep.   furosemide 40 MG tablet Commonly known as:  LASIX Take 1 tablet (40 mg total) by mouth 2 (two) times daily.   gabapentin 300 MG capsule Commonly known as:  NEURONTIN Take 1 capsule (300 mg total) by mouth 2 (two) times daily.   hydrALAZINE 50 MG tablet Commonly known as:  APRESOLINE Take 1 tablet (50 mg total) by mouth 3 (three) times daily.   isosorbide mononitrate 120 MG 24 hr tablet Commonly known as:  IMDUR Take 1 tablet (120 mg total) by mouth 2 (two) times daily.   Misc. Devices Misc by Does not apply route. C-PAP   multivitamin with minerals Tabs tablet Take 1 tablet by mouth daily.   nitroGLYCERIN 0.4 MG SL tablet Commonly known as:  NITROSTAT Place 1 tablet (0.4 mg total) under the tongue every 5 (five) minutes as needed for chest pain.   NOVOLIN 70/30 [place] Inject 20 Units into the skin 2 (two) times daily.   OZEMPIC [place] Inject 2.5 Units into the skin every Saturday.   pantoprazole 40 MG tablet Commonly known as:  PROTONIX Take 40 mg by mouth daily.   ranitidine 300 MG tablet Commonly known as:  ZANTAC Take 300 mg by mouth at bedtime.   ranolazine 500 MG 12 hr tablet Commonly known as:  RANEXA Take 1 tablet (500 mg total) by mouth 2 (two) times daily.   traMADol 50 MG tablet Commonly known as:  ULTRAM Take 1  tablet (50 mg total) by mouth every 6 (six) hours as needed for moderate pain.   vitamin E 100 UNIT capsule Take 400 Units by mouth daily.   zolpidem 10 MG tablet Commonly known as:  AMBIEN Take 10 mg by mouth at bedtime as needed for sleep.      Follow-up Information    [name], MD. Schedule an appointment as soon as possible for a visit in 1 week(s).   Specialty:  Family Medicine Contact information: [address] [place] [address] [phone]        Nelva Bush, MD .   Specialty:  Cardiology Contact information: [address] [place]  [address] [phone]          Allergies  Allergen Reactions  . Cleocin [Clindamycin Hcl] Anaphylaxis and Swelling  . Glimepiride [Amaryl] Other (See Comments)    Elevates liver function   . Clarithromycin Itching and Other (See Comments)    "Biaxin" Eyes itch and burn  . Colchicine Other (See Comments)    Affected kidneys   . Ciprocin-Fluocin-Procin [Fluocinolone Acetonide] Rash    Consultations:  Cardiology  Procedures/Studies: Dg Chest 2 View  Result Date: 06/17/2018 CLINICAL DATA:  Chest pain. EXAM: CHEST - 2 VIEW COMPARISON:  Radiograph of October 16, 2017. FINDINGS: Stable cardiomediastinal silhouette. Sternotomy wires are noted. No pneumothorax or pleural effusion is noted. Both lungs are clear. The visualized skeletal structures are unremarkable. IMPRESSION: No active cardiopulmonary disease. Electronically Signed   By: Marijo Conception, M.D.   On: 06/17/2018 20:27    Subjective: Eager to go home  Discharge Exam: Vitals:   06/17/18 2352 06/18/18 0903  BP: 139/81 128/81  Pulse:    Resp:    Temp: 98 F (36.7 C)   SpO2: 100%    Vitals:   06/17/18 2139 06/17/18 2300 06/17/18 2352 06/18/18 0903  BP:  136/80 139/81 128/81  Pulse:  87    Resp:  (!) 24    Temp:   98 F (36.7 C)   TempSrc:   Oral   SpO2:  96% 100%   Weight: 108 kg  103 kg   Height: 5' 5.5" (1.664  m)  5\' 5"  (1.651 m)     General: Pt is alert, awake, not in acute distress Cardiovascular: RRR, S1/S2 +, no rubs, no gallops Respiratory: CTA bilaterally, no wheezing, no rhonchi Abdominal: Soft, NT, ND, bowel sounds + Extremities: no edema, no cyanosis   The results of significant diagnostics from this hospitalization (including imaging, microbiology, ancillary and laboratory) are listed below for reference.     Microbiology: No results found for this or any previous visit (from the past 240 hour(s)).   Labs: BNP (last 3 results) Recent Labs    06/17/18 1934  BNP 643.3*   Basic Metabolic Panel: Recent Labs  Lab 06/17/18 1934  NA 136  K 4.1  CL 104  CO2 23  GLUCOSE 186*  BUN 45*  CREATININE 3.89*  CALCIUM 9.1   Liver Function Tests: No results for input(s): AST, ALT, ALKPHOS, BILITOT, PROT, ALBUMIN in the last 168 hours. No results for input(s): LIPASE, AMYLASE in the last 168 hours. No results for input(s): AMMONIA in the last 168 hours. CBC: Recent Labs  Lab 06/17/18 1934  WBC 10.7*  HGB 10.8*  HCT 34.8*  MCV 92.6  PLT 251   Cardiac Enzymes: Recent Labs  Lab 06/18/18 0332 06/18/18 0856  TROPONINI <0.03 <0.03   BNP: Invalid input(s): POCBNP CBG: Recent Labs  Lab 06/18/18 0830 06/18/18 1147  GLUCAP 123* 154*   D-Dimer No results for input(s): DDIMER in the last 72 hours. Hgb A1c No results for input(s): HGBA1C in the last 72 hours. Lipid Profile No results for input(s): CHOL, HDL, LDLCALC, TRIG, CHOLHDL, LDLDIRECT in the last 72 hours. Thyroid function studies No results for input(s): TSH, T4TOTAL, T3FREE, THYROIDAB in the last 72 hours.  Invalid input(s): FREET3 Anemia work up No results for input(s): VITAMINB12, FOLATE, FERRITIN, TIBC, IRON, RETICCTPCT in the last 72 hours. Urinalysis    Component Value Date/Time   COLORURINE YELLOW 03/31/2017 2014   APPEARANCEUR CLEAR 03/31/2017 2014   LABSPEC 1.015 03/31/2017 2014   PHURINE 5.0  03/31/2017 2014   GLUCOSEU NEGATIVE 03/31/2017 2014   HGBUR NEGATIVE 03/31/2017 2014   [place] NEGATIVE 03/31/2017 2014   [place] 03/31/2017 2014   PROTEINUR 100 (A) 03/31/2017 2014   UROBILINOGEN 1.0 07/17/2015 1425   NITRITE NEGATIVE 03/31/2017 2014   LEUKOCYTESUR NEGATIVE 03/31/2017 2014   Sepsis Labs Invalid input(s): PROCALCITONIN,  WBC,  LACTICIDVEN Microbiology No results found for this or any previous visit (from the past  240 hour(s)).  Time spent: 30 min  SIGNED:   Marylu Lund, MD  [facility] 06/18/2018, 11:51 AM  If 7PM-7AM, please contact night-coverage

## 2018-06-18 NOTE — Progress Notes (Signed)
   06/18/18 0900  Clinical Encounter Type  Visited With Patient and family together  Visit Type Initial  Referral From Nurse  Spiritual Encounters  Spiritual Needs Other (Comment)  Stress Factors  Patient Stress Factors None identified  Family Stress Factors None identified   Responded to spiritual care consult. PT was alert and family was at bedside. I offered an overview on the AD. Also, I gave spiritual care with ministry of presence and words of encouragement. Chaplain available as needed.   Chaplain Fidel Levy

## 2018-06-19 ENCOUNTER — Other Ambulatory Visit: Payer: Self-pay

## 2018-06-19 ENCOUNTER — Emergency Department (HOSPITAL_COMMUNITY): Payer: Medicare HMO

## 2018-06-19 ENCOUNTER — Inpatient Hospital Stay (HOSPITAL_COMMUNITY)
Admission: EM | Admit: 2018-06-19 | Discharge: 2018-06-26 | DRG: 286 | Disposition: A | Discharge status: Home / Self Care | Payer: Medicare HMO | Attending: Family Medicine | Admitting: Family Medicine

## 2018-06-19 ENCOUNTER — Encounter (HOSPITAL_COMMUNITY): Payer: Self-pay | Admitting: Emergency Medicine

## 2018-06-19 DIAGNOSIS — Z79899 Other long term (current) drug therapy: Secondary | ICD-10-CM

## 2018-06-19 DIAGNOSIS — N2581 Secondary hyperparathyroidism of renal origin: Secondary | ICD-10-CM | POA: Diagnosis present

## 2018-06-19 DIAGNOSIS — I5042 Chronic combined systolic (congestive) and diastolic (congestive) heart failure: Secondary | ICD-10-CM | POA: Diagnosis present

## 2018-06-19 DIAGNOSIS — I10 Essential (primary) hypertension: Secondary | ICD-10-CM | POA: Diagnosis not present

## 2018-06-19 DIAGNOSIS — Z955 Presence of coronary angioplasty implant and graft: Secondary | ICD-10-CM

## 2018-06-19 DIAGNOSIS — E1121 Type 2 diabetes mellitus with diabetic nephropathy: Secondary | ICD-10-CM | POA: Diagnosis present

## 2018-06-19 DIAGNOSIS — M109 Gout, unspecified: Secondary | ICD-10-CM | POA: Diagnosis present

## 2018-06-19 DIAGNOSIS — I2510 Atherosclerotic heart disease of native coronary artery without angina pectoris: Secondary | ICD-10-CM

## 2018-06-19 DIAGNOSIS — G4700 Insomnia, unspecified: Secondary | ICD-10-CM | POA: Diagnosis present

## 2018-06-19 DIAGNOSIS — Z841 Family history of disorders of kidney and ureter: Secondary | ICD-10-CM

## 2018-06-19 DIAGNOSIS — R079 Chest pain, unspecified: Secondary | ICD-10-CM | POA: Diagnosis present

## 2018-06-19 DIAGNOSIS — G8929 Other chronic pain: Secondary | ICD-10-CM | POA: Diagnosis present

## 2018-06-19 DIAGNOSIS — G4733 Obstructive sleep apnea (adult) (pediatric): Secondary | ICD-10-CM | POA: Diagnosis present

## 2018-06-19 DIAGNOSIS — Z833 Family history of diabetes mellitus: Secondary | ICD-10-CM

## 2018-06-19 DIAGNOSIS — E669 Obesity, unspecified: Secondary | ICD-10-CM | POA: Diagnosis present

## 2018-06-19 DIAGNOSIS — J181 Lobar pneumonia, unspecified organism: Secondary | ICD-10-CM | POA: Diagnosis not present

## 2018-06-19 DIAGNOSIS — E1122 Type 2 diabetes mellitus with diabetic chronic kidney disease: Secondary | ICD-10-CM | POA: Diagnosis present

## 2018-06-19 DIAGNOSIS — I447 Left bundle-branch block, unspecified: Secondary | ICD-10-CM | POA: Diagnosis present

## 2018-06-19 DIAGNOSIS — E1142 Type 2 diabetes mellitus with diabetic polyneuropathy: Secondary | ICD-10-CM | POA: Diagnosis present

## 2018-06-19 DIAGNOSIS — I5031 Acute diastolic (congestive) heart failure: Secondary | ICD-10-CM

## 2018-06-19 DIAGNOSIS — M1A9XX0 Chronic gout, unspecified, without tophus (tophi): Secondary | ICD-10-CM | POA: Diagnosis present

## 2018-06-19 DIAGNOSIS — Z7902 Long term (current) use of antithrombotics/antiplatelets: Secondary | ICD-10-CM

## 2018-06-19 DIAGNOSIS — I5043 Acute on chronic combined systolic (congestive) and diastolic (congestive) heart failure: Secondary | ICD-10-CM | POA: Diagnosis not present

## 2018-06-19 DIAGNOSIS — J9600 Acute respiratory failure, unspecified whether with hypoxia or hypercapnia: Secondary | ICD-10-CM

## 2018-06-19 DIAGNOSIS — E785 Hyperlipidemia, unspecified: Secondary | ICD-10-CM | POA: Diagnosis present

## 2018-06-19 DIAGNOSIS — R9439 Abnormal result of other cardiovascular function study: Secondary | ICD-10-CM | POA: Diagnosis not present

## 2018-06-19 DIAGNOSIS — T82858A Stenosis of vascular prosthetic devices, implants and grafts, initial encounter: Secondary | ICD-10-CM | POA: Diagnosis present

## 2018-06-19 DIAGNOSIS — D649 Anemia, unspecified: Secondary | ICD-10-CM | POA: Diagnosis present

## 2018-06-19 DIAGNOSIS — I252 Old myocardial infarction: Secondary | ICD-10-CM

## 2018-06-19 DIAGNOSIS — K5900 Constipation, unspecified: Secondary | ICD-10-CM | POA: Diagnosis not present

## 2018-06-19 DIAGNOSIS — D72829 Elevated white blood cell count, unspecified: Secondary | ICD-10-CM | POA: Diagnosis present

## 2018-06-19 DIAGNOSIS — I255 Ischemic cardiomyopathy: Secondary | ICD-10-CM | POA: Diagnosis present

## 2018-06-19 DIAGNOSIS — R0602 Shortness of breath: Secondary | ICD-10-CM

## 2018-06-19 DIAGNOSIS — Z8349 Family history of other endocrine, nutritional and metabolic diseases: Secondary | ICD-10-CM

## 2018-06-19 DIAGNOSIS — I5032 Chronic diastolic (congestive) heart failure: Secondary | ICD-10-CM | POA: Diagnosis not present

## 2018-06-19 DIAGNOSIS — I5030 Unspecified diastolic (congestive) heart failure: Secondary | ICD-10-CM | POA: Diagnosis not present

## 2018-06-19 DIAGNOSIS — Z87891 Personal history of nicotine dependence: Secondary | ICD-10-CM

## 2018-06-19 DIAGNOSIS — Z951 Presence of aortocoronary bypass graft: Secondary | ICD-10-CM

## 2018-06-19 DIAGNOSIS — Z794 Long term (current) use of insulin: Secondary | ICD-10-CM

## 2018-06-19 DIAGNOSIS — I350 Nonrheumatic aortic (valve) stenosis: Secondary | ICD-10-CM | POA: Diagnosis present

## 2018-06-19 DIAGNOSIS — Z6839 Body mass index (BMI) 39.0-39.9, adult: Secondary | ICD-10-CM | POA: Diagnosis not present

## 2018-06-19 DIAGNOSIS — I25110 Atherosclerotic heart disease of native coronary artery with unstable angina pectoris: Principal | ICD-10-CM | POA: Diagnosis present

## 2018-06-19 DIAGNOSIS — J9601 Acute respiratory failure with hypoxia: Secondary | ICD-10-CM | POA: Diagnosis not present

## 2018-06-19 DIAGNOSIS — Z09 Encounter for follow-up examination after completed treatment for conditions other than malignant neoplasm: Secondary | ICD-10-CM

## 2018-06-19 DIAGNOSIS — N185 Chronic kidney disease, stage 5: Secondary | ICD-10-CM | POA: Diagnosis not present

## 2018-06-19 DIAGNOSIS — Z87892 Personal history of anaphylaxis: Secondary | ICD-10-CM

## 2018-06-19 DIAGNOSIS — D631 Anemia in chronic kidney disease: Secondary | ICD-10-CM | POA: Diagnosis present

## 2018-06-19 DIAGNOSIS — Z8271 Family history of polycystic kidney: Secondary | ICD-10-CM

## 2018-06-19 DIAGNOSIS — Q613 Polycystic kidney, unspecified: Secondary | ICD-10-CM | POA: Diagnosis not present

## 2018-06-19 DIAGNOSIS — I130 Hypertensive heart and chronic kidney disease with heart failure and stage 1 through stage 4 chronic kidney disease, or unspecified chronic kidney disease: Secondary | ICD-10-CM | POA: Diagnosis present

## 2018-06-19 DIAGNOSIS — Z8249 Family history of ischemic heart disease and other diseases of the circulatory system: Secondary | ICD-10-CM

## 2018-06-19 DIAGNOSIS — Z801 Family history of malignant neoplasm of trachea, bronchus and lung: Secondary | ICD-10-CM

## 2018-06-19 DIAGNOSIS — Z9861 Coronary angioplasty status: Secondary | ICD-10-CM

## 2018-06-19 DIAGNOSIS — N184 Chronic kidney disease, stage 4 (severe): Secondary | ICD-10-CM | POA: Diagnosis present

## 2018-06-19 DIAGNOSIS — E1159 Type 2 diabetes mellitus with other circulatory complications: Secondary | ICD-10-CM | POA: Diagnosis present

## 2018-06-19 DIAGNOSIS — K219 Gastro-esophageal reflux disease without esophagitis: Secondary | ICD-10-CM | POA: Diagnosis present

## 2018-06-19 DIAGNOSIS — I200 Unstable angina: Secondary | ICD-10-CM | POA: Diagnosis not present

## 2018-06-19 DIAGNOSIS — Z881 Allergy status to other antibiotic agents status: Secondary | ICD-10-CM

## 2018-06-19 DIAGNOSIS — Z7982 Long term (current) use of aspirin: Secondary | ICD-10-CM

## 2018-06-19 LAB — BASIC METABOLIC PANEL
Anion gap: 12 (ref 5–15)
BUN: 51 mg/dL — AB (ref 6–20)
CALCIUM: 9.3 mg/dL (ref 8.9–10.3)
CO2: 23 mmol/L (ref 22–32)
CREATININE: 4.04 mg/dL — AB (ref 0.61–1.24)
Chloride: 102 mmol/L (ref 98–111)
GFR calc non Af Amer: 15 mL/min — ABNORMAL LOW (ref >60)
GFR, EST AFRICAN AMERICAN: 17 mL/min — AB (ref >60)
Glucose, Bld: 176 mg/dL — ABNORMAL HIGH (ref 70–99)
Potassium: 4.7 mmol/L (ref 3.5–5.1)
SODIUM: 137 mmol/L (ref 135–145)

## 2018-06-19 LAB — CBC
HCT: 35.8 % — ABNORMAL LOW (ref 39.0–52.0)
Hemoglobin: 11 g/dL — ABNORMAL LOW (ref 13.0–17.0)
MCH: 28.7 pg (ref 26.0–34.0)
MCHC: 30.7 g/dL (ref 30.0–36.0)
MCV: 93.5 fL (ref 80.0–100.0)
NRBC: 0 % (ref 0.0–0.2)
PLATELETS: 262 10*3/uL (ref 150–400)
RBC: 3.83 MIL/uL — ABNORMAL LOW (ref 4.22–5.81)
RDW: 14.2 % (ref 11.5–15.5)
WBC: 11.9 10*3/uL — AB (ref 4.0–10.5)

## 2018-06-19 LAB — I-STAT TROPONIN, ED: Troponin i, poc: 0.01 ng/mL (ref 0.00–0.08)

## 2018-06-19 LAB — GLUCOSE, CAPILLARY: GLUCOSE-CAPILLARY: 197 mg/dL — AB (ref 70–99)

## 2018-06-19 LAB — TROPONIN I: Troponin I: 0.12 ng/mL

## 2018-06-19 MED ORDER — NITROGLYCERIN 0.4 MG SL SUBL
0.4000 mg | SUBLINGUAL_TABLET | SUBLINGUAL | Status: DC | PRN
  Administered 2018-06-20 (×3): 0.4 mg via SUBLINGUAL
  Filled 2018-06-19: qty 1

## 2018-06-19 MED ORDER — ATORVASTATIN CALCIUM 80 MG PO TABS
80.0000 mg | ORAL_TABLET | Freq: Every day | ORAL | Status: DC
  Administered 2018-06-20 – 2018-06-26 (×7): 80 mg via ORAL
  Filled 2018-06-19 (×7): qty 1

## 2018-06-19 MED ORDER — GABAPENTIN 300 MG PO CAPS
300.0000 mg | ORAL_CAPSULE | Freq: Two times a day (BID) | ORAL | Status: DC
  Administered 2018-06-19 – 2018-06-26 (×14): 300 mg via ORAL
  Filled 2018-06-19 (×14): qty 1

## 2018-06-19 MED ORDER — RANOLAZINE ER 500 MG PO TB12
500.0000 mg | ORAL_TABLET | Freq: Two times a day (BID) | ORAL | Status: DC
  Administered 2018-06-19 – 2018-06-25 (×12): 500 mg via ORAL
  Filled 2018-06-19 (×12): qty 1

## 2018-06-19 MED ORDER — DIPHENHYDRAMINE HCL 25 MG PO CAPS: 25.0000 mg | ORAL_CAPSULE | Freq: Four times a day (QID) | ORAL | Status: DC | PRN

## 2018-06-19 MED ORDER — ACETAMINOPHEN 500 MG PO TABS
500.0000 mg | ORAL_TABLET | Freq: Three times a day (TID) | ORAL | Status: DC | PRN
  Administered 2018-06-21: 500 mg via ORAL
  Filled 2018-06-19: qty 1

## 2018-06-19 MED ORDER — ZOLPIDEM TARTRATE 5 MG PO TABS
10.0000 mg | ORAL_TABLET | Freq: Every evening | ORAL | Status: DC | PRN
  Administered 2018-06-19: 10 mg via ORAL
  Filled 2018-06-19: qty 2

## 2018-06-19 MED ORDER — TRAMADOL HCL 50 MG PO TABS
50.0000 mg | ORAL_TABLET | Freq: Two times a day (BID) | ORAL | Status: DC | PRN
  Administered 2018-06-23: 50 mg via ORAL
  Filled 2018-06-19: qty 1

## 2018-06-19 MED ORDER — ALLOPURINOL 100 MG PO TABS
100.0000 mg | ORAL_TABLET | Freq: Every day | ORAL | Status: DC
  Administered 2018-06-20 – 2018-06-26 (×7): 100 mg via ORAL
  Filled 2018-06-19 (×7): qty 1

## 2018-06-19 MED ORDER — FAMOTIDINE 20 MG PO TABS
20.0000 mg | ORAL_TABLET | Freq: Every day | ORAL | Status: DC
  Administered 2018-06-20 – 2018-06-26 (×7): 20 mg via ORAL
  Filled 2018-06-19 (×7): qty 1

## 2018-06-19 MED ORDER — VITAMIN E 180 MG (400 UNIT) PO CAPS
400.0000 [IU] | ORAL_CAPSULE | Freq: Every day | ORAL | Status: DC
  Administered 2018-06-20 – 2018-06-26 (×7): 400 [IU] via ORAL
  Filled 2018-06-19 (×7): qty 1

## 2018-06-19 MED ORDER — AMLODIPINE BESYLATE 10 MG PO TABS: 10.0000 mg | ORAL_TABLET | Freq: Every day | ORAL | Status: DC

## 2018-06-19 MED ORDER — HEPARIN (PORCINE) IN NACL 100-0.45 UNIT/ML-% IJ SOLN
1250.0000 [IU]/h | INTRAMUSCULAR | Status: DC
  Administered 2018-06-19: 1100 [IU]/h via INTRAVENOUS
  Administered 2018-06-21: 950 [IU]/h via INTRAVENOUS
  Administered 2018-06-22: 1100 [IU]/h via INTRAVENOUS
  Filled 2018-06-19 (×4): qty 250

## 2018-06-19 MED ORDER — PANTOPRAZOLE SODIUM 40 MG PO TBEC
40.0000 mg | DELAYED_RELEASE_TABLET | Freq: Every day | ORAL | Status: DC
  Administered 2018-06-20 – 2018-06-26 (×7): 40 mg via ORAL
  Filled 2018-06-19 (×7): qty 1

## 2018-06-19 MED ORDER — HEPARIN BOLUS VIA INFUSION
4000.0000 [IU] | Freq: Once | INTRAVENOUS | Status: AC
  Administered 2018-06-19: 4000 [IU] via INTRAVENOUS
  Filled 2018-06-19: qty 4000

## 2018-06-19 MED ORDER — ISOSORBIDE MONONITRATE ER 60 MG PO TB24
120.0000 mg | ORAL_TABLET | Freq: Two times a day (BID) | ORAL | Status: DC
  Administered 2018-06-19: 120 mg via ORAL
  Filled 2018-06-19: qty 2

## 2018-06-19 MED ORDER — CARVEDILOL 25 MG PO TABS
50.0000 mg | ORAL_TABLET | Freq: Two times a day (BID) | ORAL | Status: DC
  Administered 2018-06-19 – 2018-06-26 (×14): 50 mg via ORAL
  Filled 2018-06-19 (×14): qty 2

## 2018-06-19 MED ORDER — ASPIRIN 325 MG PO TABS
325.0000 mg | ORAL_TABLET | Freq: Once | ORAL | Status: AC
  Administered 2018-06-19: 325 mg via ORAL
  Filled 2018-06-19: qty 1

## 2018-06-19 MED ORDER — VITAMIN D 1000 UNITS PO TABS
2000.0000 [IU] | ORAL_TABLET | Freq: Every day | ORAL | Status: DC
  Administered 2018-06-20 – 2018-06-26 (×7): 2000 [IU] via ORAL
  Filled 2018-06-19 (×7): qty 2

## 2018-06-19 MED ORDER — ADULT MULTIVITAMIN W/MINERALS CH
1.0000 | ORAL_TABLET | Freq: Every day | ORAL | Status: DC
  Administered 2018-06-20 – 2018-06-26 (×7): 1 via ORAL
  Filled 2018-06-19 (×7): qty 1

## 2018-06-19 MED ORDER — SODIUM CHLORIDE 0.9 % IV SOLN
INTRAVENOUS | Status: DC
  Administered 2018-06-19 – 2018-06-20 (×2): via INTRAVENOUS
  Administered 2018-06-21: 500 mL via INTRAVENOUS

## 2018-06-19 MED ORDER — CLOPIDOGREL BISULFATE 75 MG PO TABS
75.0000 mg | ORAL_TABLET | Freq: Every day | ORAL | Status: DC
  Administered 2018-06-20 – 2018-06-26 (×7): 75 mg via ORAL
  Filled 2018-06-19 (×7): qty 1

## 2018-06-19 MED ORDER — INSULIN GLARGINE 100 UNIT/ML ~~LOC~~ SOLN
10.0000 [IU] | Freq: Two times a day (BID) | SUBCUTANEOUS | Status: DC
  Administered 2018-06-19: 10 [IU] via SUBCUTANEOUS
  Filled 2018-06-19 (×2): qty 0.1

## 2018-06-19 MED ORDER — ASPIRIN EC 81 MG PO TBEC
81.0000 mg | DELAYED_RELEASE_TABLET | Freq: Every day | ORAL | Status: DC
  Administered 2018-06-20 – 2018-06-26 (×5): 81 mg via ORAL
  Filled 2018-06-19 (×6): qty 1

## 2018-06-19 NOTE — ED Notes (Signed)
Pt to xray at this time.

## 2018-06-19 NOTE — H&P (Signed)
History and Physical    Matthew Rowe IRC:[id number] DOB: 1960/10/23 DOA: 06/19/2018  PCP: [name], MD Patient coming from: Home  Chief Complaint: Chest pain  HPI: Matthew Rowe. is a 57 y.o. male with medical history significant of coronary artery disease status post CABG and multiple PCI's, chronic angina, chronic diastolic congestive heart failure, type 2 diabetes, hypertension, CKD stage IV presenting to the hospital for further evaluation of chest pain. Patient went to [facility] for a pharmacological stress test today and developed severe chest pain after administration of regadenoson that did not improve with aminophylline or caffeine.  He was sent to the ED for further evaluation.  Patient reports having substernal, 10 out of 10 intensity, pressure-like chest pain and associated shortness of breath during the stress test.  States the doctor there told him that she noticed a blockage in his heart.  States he is currently chest pain-free.  ED Course: Vitals stable on arrival.  White count 11.9.  I-STAT troponin negative.  Chest x-ray showing no active cardiopulmonary disease.  EKG showing sinus tachycardia and left bundle branch block.  Patient was seen by cardiology.  TRH paged to admit.  Review of Systems: As per HPI otherwise 10 point review of systems negative.  Past Medical History:  Diagnosis Date  . Acute on chronic combined systolic and diastolic CHF (congestive heart failure) ([place]) 10/15/2017  . Chronic anemia   . Chronic combined systolic and diastolic CHF (congestive heart failure) ([place])   . Chronic stable angina ([place])   . CKD (chronic kidney disease), stage IV ([place])   . Coronary artery disease    a. INF MI 1999: PCI of RCA;  b.  extensive stenting of LAD;  c. s/p CABG with RIMA->RCA in 2006;  d. Stable, Low risk MV 05/2012; e. 09/2012 Cath: stable anatomy->med Rx. f. Abnl nuc 11/2013 -> med rx; g. 11/2013 Cath/PCI: s/p DES to RCA. h. Abnormal nuc 11/2016, pt  declined cath.   . DM2 (diabetes mellitus, type 2) ([place])   . Dyslipidemia   . GERD (gastroesophageal reflux disease)   . History of gout   . Hypertension   . Hypertensive heart disease   . Ischemic cardiomyopathy   . Mild aortic stenosis   . Myocardial infarction [facility]) 1999; 2002; 2003; 2006; ~ 2008  . OSA on CPAP    wears CPAP  . Polycystic kidney disease     Past Surgical History:  Procedure Laterality Date  . AV FISTULA PLACEMENT Left 05/29/2018   Procedure: ARTERIOVENOUS (AV) FISTULA CREATION  LEFT ARM.;  Surgeon: Serafina Mitchell, MD;  Location: [place];  Service: Vascular;  Laterality: Left;  . CARDIAC CATHETERIZATION  2012  . CARDIAC CATHETERIZATION  2013  . CARDIAC CATHETERIZATION  2014   LHC (1/14): total occlusion of distal LAD, diffuse disease in ramus, 70% proximal LCx, 50% proximal and mid RCA, 50% distal RCA, patent RIMA-RCA.  Marland Kitchen CORONARY ANGIOPLASTY WITH STENT PLACEMENT  1999 / 2002 / 2004 / 2006?  . CORONARY ARTERY BYPASS GRAFT  2006   CABG X?; in [place]  . CYSTECTOMY  1990's   off face  . HERNIA REPAIR     inguinal hernia  . LEFT HEART CATHETERIZATION WITH CORONARY/GRAFT ANGIOGRAM N/A 09/16/2012   Procedure: LEFT HEART CATHETERIZATION WITH [name];  Surgeon: [name], MD;  Location: [facility] CATH LAB;  Service: Cardiovascular;  Laterality: N/A;  . LEFT HEART CATHETERIZATION WITH CORONARY/GRAFT ANGIOGRAM N/A 12/02/2013   Procedure: LEFT HEART  CATHETERIZATION WITH [name];  Surgeon: [name], MD;  Location: [facility] CATH LAB;  Service: Cardiovascular;  Laterality: N/A;  . PERCUTANEOUS CORONARY STENT INTERVENTION (PCI-S) N/A 12/03/2013   Procedure: PERCUTANEOUS CORONARY STENT INTERVENTION (PCI-S);  Surgeon: [name], MD;  Location: [facility] CATH LAB;  Service: Cardiovascular;  Laterality: N/A;     reports that he has quit smoking. His smoking use included cigarettes. He has never used smokeless tobacco. He reports that he does not  drink alcohol or use drugs.  Allergies  Allergen Reactions  . Cleocin [Clindamycin Hcl] Anaphylaxis and Swelling  . Glimepiride [Amaryl] Other (See Comments)    Elevates liver function   . Clarithromycin Itching and Other (See Comments)    "Biaxin" Eyes itch and burn  . Colchicine Other (See Comments)    Affected kidneys   . Ciprocin-Fluocin-Procin [Fluocinolone Acetonide] Rash    Family History  Problem Relation Age of Onset  . Lung cancer Mother   . Anemia Mother   . Polycystic kidney disease Mother   . Diabetes Father   . Heart attack Father 62       Died suddenly  . Heart failure Father   . Hyperlipidemia Father   . Hypertension Father   . Kidney failure Sister 5       Died  . Heart attack Sister   . Hypertension Sister   . Diabetes Sister   . Diabetes Brother   . Polycystic kidney disease Brother   . Diabetes Sister     Prior to Admission medications   Medication Sig Start Date End Date Taking? Authorizing Provider  acetaminophen (TYLENOL) 500 MG tablet Take 500 mg by mouth 3 (three) times daily as needed (for pain or headaches).    Yes [provider]  allopurinol (ZYLOPRIM) 100 MG tablet Take 100 mg by mouth daily.    Yes [provider]  amLODipine (NORVASC) 10 MG tablet Take 1 tablet (10 mg total) by mouth daily. 01/12/15  Yes Copland, Gay Filler, MD  aspirin EC 81 MG EC tablet Take 1 tablet (81 mg total) by mouth daily. 10/19/17  Yes Kilroy, Luke K, PA-C  atorvastatin (LIPITOR) 80 MG tablet Take 1 tablet (80 mg total) by mouth daily. 08/26/17  Yes Dunn, Dayna N, PA-C  carvedilol (COREG) 25 MG tablet Take 2 tablets (50 mg total) by mouth 2 (two) times daily. 06/06/18  Yes End, Harrell Gave, MD  Cholecalciferol 2000 units TABS Take 2,000 Units by mouth daily.    Yes [provider]  clopidogrel (PLAVIX) 75 MG tablet TAKE ONE TABLET BY MOUTH ONCE DAILY WITH BREAKFAST Patient taking differently: Take 75 mg by mouth daily.  12/25/16  Yes End,  Harrell Gave, MD  diphenhydrAMINE (BENADRYL) 25 MG tablet Take 25 mg by mouth every 6 (six) hours as needed for allergies or sleep.    Yes [provider]  furosemide (LASIX) 40 MG tablet Take 1 tablet (40 mg total) by mouth 2 (two) times daily. 12/27/17  Yes End, Harrell Gave, MD  gabapentin (NEURONTIN) 300 MG capsule Take 1 capsule (300 mg total) by mouth 2 (two) times daily. 04/03/17  Yes Eulogio Bear U, DO  hydrALAZINE (APRESOLINE) 50 MG tablet Take 1 tablet (50 mg total) by mouth 3 (three) times daily. 12/20/16  Yes End, Harrell Gave, MD  Insulin NPH Isophane & Regular (NOVOLIN 70/30 [place]) Inject 20 Units into the skin 2 (two) times daily.    Yes [provider]  isosorbide mononitrate (IMDUR) 120 MG 24 hr  tablet Take 1 tablet (120 mg total) by mouth 2 (two) times daily. 08/26/17  Yes Dunn, Dayna N, PA-C  Multiple Vitamin (MULTIVITAMIN WITH MINERALS) TABS tablet Take 1 tablet by mouth daily.   Yes [provider]  nitroGLYCERIN (NITROSTAT) 0.4 MG SL tablet Place 1 tablet (0.4 mg total) under the tongue every 5 (five) minutes as needed for chest pain. 06/06/18  Yes End, Harrell Gave, MD  pantoprazole (PROTONIX) 40 MG tablet Take 40 mg by mouth daily.    Yes [provider]  ranitidine (ZANTAC) 300 MG tablet Take 300 mg by mouth at bedtime.   Yes [provider]  ranolazine (RANEXA) 500 MG 12 hr tablet Take 1 tablet (500 mg total) by mouth 2 (two) times daily. 03/29/17  Yes End, Harrell Gave, MD  Semaglutide [facility]) Inject 2.5 Units into the skin every Saturday.    Yes [provider]  traMADol (ULTRAM) 50 MG tablet Take 1 tablet (50 mg total) by mouth every 6 (six) hours as needed for moderate pain. 05/29/18  Yes Ulyses Amor, PA-C  vitamin E 100 UNIT capsule Take 400 Units by mouth daily.   Yes [provider]  zolpidem (AMBIEN) 10 MG tablet Take 10 mg by mouth at bedtime as needed for sleep.  09/30/17  Yes [provider]  [place].  Devices MISC by Does not apply route. C-PAP    [provider]    Physical Exam: Vitals:   06/19/18 1915 06/19/18 1930 06/19/18 2000 06/19/18 2102  BP: (!) 136/97 (!) 126/112 (!) 144/97 131/78  Pulse: 90 93 95 92  Resp: 18 (!) 22 (!) 22   Temp:    98 F (36.7 C)  TempSrc:    Oral  SpO2: 97% 94% 96% 98%  Weight:    105.6 kg  Height:    5\' 5"  (1.651 m)   Physical Exam  Constitutional: He is oriented to person, place, and time. He appears well-developed and well-nourished. No distress.  Sitting up comfortably in a hospital bed watching television  HENT:  Head: Normocephalic and atraumatic.  Mouth/Throat: Oropharynx is clear and moist.  Eyes: Right eye exhibits no discharge. Left eye exhibits no discharge.  Neck: Neck supple. No tracheal deviation present.  Cardiovascular: Normal rate, regular rhythm and intact distal pulses.  Pulmonary/Chest: Effort normal and breath sounds normal. No respiratory distress. He has no wheezes. He has no rales.  Abdominal: Soft. Bowel sounds are normal. He exhibits no distension. There is no tenderness.  Musculoskeletal: He exhibits no edema.  Neurological: He is alert and oriented to person, place, and time.  Skin: Skin is warm and dry. He is not diaphoretic.  Psychiatric: His behavior is normal.     Labs on Admission: I have personally reviewed following labs and imaging studies  CBC: Recent Labs  Lab 06/17/18 1934 06/19/18 1647  WBC 10.7* 11.9*  HGB 10.8* 11.0*  HCT 34.8* 35.8*  MCV 92.6 93.5  PLT 251 465   Basic Metabolic Panel: Recent Labs  Lab 06/17/18 1934 06/19/18 1647  NA 136 137  K 4.1 4.7  CL 104 102  CO2 23 23  GLUCOSE 186* 176*  BUN 45* 51*  CREATININE 3.89* 4.04*  CALCIUM 9.1 9.3   GFR: Estimated Creatinine Clearance: 22.3 mL/min (A) (by C-G formula based on SCr of 4.04 mg/dL (H)). Liver Function Tests: No results for input(s): AST, ALT, ALKPHOS, BILITOT, PROT, ALBUMIN in the last 168 hours. No results  for input(s): LIPASE, AMYLASE in the  last 168 hours. No results for input(s): AMMONIA in the last 168 hours. Coagulation Profile: Recent Labs  Lab 06/17/18 1934  INR 0.96   Cardiac Enzymes: Recent Labs  Lab 06/18/18 0332 06/18/18 0856 06/19/18 2249  TROPONINI <0.03 <0.03 0.12*   BNP (last 3 results) No results for input(s): PROBNP in the last 8760 hours. HbA1C: No results for input(s): HGBA1C in the last 72 hours. CBG: Recent Labs  Lab 06/18/18 0830 06/18/18 1147 06/19/18 2102  GLUCAP 123* 154* 197*   Lipid Profile: No results for input(s): CHOL, HDL, LDLCALC, TRIG, CHOLHDL, LDLDIRECT in the last 72 hours. Thyroid Function Tests: No results for input(s): TSH, T4TOTAL, FREET4, T3FREE, THYROIDAB in the last 72 hours. Anemia Panel: No results for input(s): VITAMINB12, FOLATE, FERRITIN, TIBC, IRON, RETICCTPCT in the last 72 hours. Urine analysis:    Component Value Date/Time   COLORURINE YELLOW 03/31/2017 2014   APPEARANCEUR CLEAR 03/31/2017 2014   LABSPEC 1.015 03/31/2017 2014   PHURINE 5.0 03/31/2017 2014   GLUCOSEU NEGATIVE 03/31/2017 2014   HGBUR NEGATIVE 03/31/2017 2014   [place] NEGATIVE 03/31/2017 2014   [place] 03/31/2017 2014   PROTEINUR 100 (A) 03/31/2017 2014   UROBILINOGEN 1.0 07/17/2015 1425   NITRITE NEGATIVE 03/31/2017 2014   LEUKOCYTESUR NEGATIVE 03/31/2017 2014    Radiological Exams on Admission: Dg Chest 2 View  Result Date: 06/19/2018 CLINICAL DATA:  Chest pain EXAM: CHEST - 2 VIEW COMPARISON:  06/17/2018 FINDINGS: Prior median sternotomy. Heart is normal size. No confluent airspace opacities or effusions. IMPRESSION: No active cardiopulmonary disease. Electronically Signed   By: Rolm Baptise M.D.   On: 06/19/2018 17:38    EKG: Independently reviewed.  Sinus tachycardia and left bundle branch block.  Assessment/Plan Principal Problem:   Unstable angina (HCC) Active Problems:   Hypertension   Type 2 diabetes with  nephropathy (HCC)   Gout   HLD (hyperlipidemia)   CKD (chronic kidney disease) stage 4, GFR 15-29 ml/min (HCC)   Chronic anemia   Chronic diastolic heart failure (HCC)   Leukocytosis   Chronic pain   Insomnia   Unstable angina -Experienced chest pain during stress test.  Currently chest pain-free. -Hemodynamically stable.  I-STAT troponin negative.  EKG showing sinus tachycardia and left bundle branch block. -Patient was seen by cardiology.  Recommendation is to keep him on heparin drip.  Give statin, aspirin, home carvedilol, and home Plavix.  Trend troponin.  N.p.o. after midnight for possible cath tomorrow. -Continue home ranolazine, sublingual nitroglycerin as needed for angina  CKD stage IV -Kidney function declining over time.  Currently creatinine 4.0 and GFR 17.  -Please consult nephrology in the morning as cath will likely necessitate dialysis given his end-stage renal disease. -IV fluid hydration tonight for possible cath tomorrow -Continue to monitor; BMP in a.m.  Type 2 diabetes -Blood glucose 176 on admission.  A1c 7.0 three weeks ago. -Give home Lantus at 50% of the usual dose as patient is n.p.o. -Lantus 10 units twice daily -Sliding scale insulin sensitive -CBG checks  Mild leukocytosis White count 11.9.  Likely stress reaction.  Patient is afebrile.  Chest x-ray without signs of infection. -Continue to monitor; CBC in a.m.  Hypertension Currently normotensive. -Continue home hydralazine, carvedilol, isosorbide mononitrate, and amlodipine   Chronic diastolic congestive heart failure Stable.  Currently not volume overloaded. -Continue home carvedilol and isosorbide mononitrate -Hold home Lasix at this time  Hyperlipidemia -Continue home atorvastatin  Anemia of chronic disease -Stable.  Hemoglobin 11.0; baseline 9-10.  Gout -  Continue allopurinol   Chronic pain/neuropathy -Continue gabapentin  -Continue home tramadol  Insomnia -Continue home  Ambien prn   DVT prophylaxis: Heparin Code Status: Patient wishes to be full code. Family Communication: Wife at bedside updated. Disposition Plan: Anticipate discharge in 2 to 3 days to home. Consults called: Cardiology-Dr. Harrell Gave Admission status: Inpatient It is my clinical opinion that admission to INPATIENT is reasonable and necessary in this 57 y.o. male . presenting with symptoms of chest pain, concerning for unstable angina . in the context of PMH including: Coronary artery disease . Workup and treatment include IV heparin, IV fluids.  Patient will need cardiac catheterization.  Dye load from cardiac catheterization will likely necessitate starting patient on hemodialysis during this hospitalization due to history of CKD stage IV.   Given the aforementioned, the predictability of an adverse outcome is felt to be significant. I expect that the patient will require at least 2 midnights in the hospital to treat this condition.    Shela Leff MD [facility] Pager [phone]  If 7PM-7AM, please contact night-coverage [email or web address] Password [email or web address]  06/20/2018, 2:02 AM

## 2018-06-19 NOTE — Progress Notes (Signed)
ANTICOAGULATION CONSULT NOTE - Initial Consult  Pharmacy Consult for Heparin  Indication: chest pain/ACS  Allergies  Allergen Reactions  . Cleocin [Clindamycin Hcl] Anaphylaxis and Swelling  . Glimepiride [Amaryl] Other (See Comments)    Elevates liver function   . Clarithromycin Itching and Other (See Comments)    "Biaxin" Eyes itch and burn  . Colchicine Other (See Comments)    Affected kidneys   . Ciprocin-Fluocin-Procin [Fluocinolone Acetonide] Rash    Patient Measurements: Height: 5\' 5"  (165.1 cm) Weight: 232 lb 14.4 oz (105.6 kg) IBW/kg (Calculated) : 61.5  Vital Signs: Temp: 98 F (36.7 C) (10/10 2102) Temp Source: Oral (10/10 2102) BP: 131/78 (10/10 2102) Pulse Rate: 92 (10/10 2102)  Labs: Recent Labs    06/17/18 1934 06/18/18 0332 06/18/18 0722 06/18/18 0856 06/19/18 1647  HGB 10.8*  --   --   --  11.0*  HCT 34.8*  --   --   --  35.8*  PLT 251  --   --   --  262  LABPROT 12.6  --   --   --   --   INR 0.96  --   --   --   --   HEPARINUNFRC  --   --  0.69  --   --   CREATININE 3.89*  --   --   --  4.04*  TROPONINI  --  <0.03  --  <0.03  --     Estimated Creatinine Clearance: 22.3 mL/min (A) (by C-G formula based on SCr of 4.04 mg/dL (H)).   Medical History: Past Medical History:  Diagnosis Date  . Acute on chronic combined systolic and diastolic CHF (congestive heart failure) ([place]) 10/15/2017  . Chronic anemia   . Chronic combined systolic and diastolic CHF (congestive heart failure) ([place])   . Chronic stable angina ([place])   . CKD (chronic kidney disease), stage IV ([place])   . Coronary artery disease    a. INF MI 1999: PCI of RCA;  b.  extensive stenting of LAD;  c. s/p CABG with RIMA->RCA in 2006;  d. Stable, Low risk MV 05/2012; e. 09/2012 Cath: stable anatomy->med Rx. f. Abnl nuc 11/2013 -> med rx; g. 11/2013 Cath/PCI: s/p DES to RCA. h. Abnormal nuc 11/2016, pt declined cath.   . DM2 (diabetes mellitus, type 2) ([place])   . Dyslipidemia   . GERD  (gastroesophageal reflux disease)   . History of gout   . Hypertension   . Hypertensive heart disease   . Ischemic cardiomyopathy   . Mild aortic stenosis   . Myocardial infarction [facility]) 1999; 2002; 2003; 2006; ~ 2008  . OSA on CPAP    wears CPAP  . Polycystic kidney disease     Assessment: Just discharged 10/9. Pt had a stress test today at the [place] (we don't know results) and developed severe chest pain. Will likely need cath but he has severe CKD. PTA meds reviewed. Hgb 11.   Goal of Therapy:  Heparin level 0.3-0.7 units/ml Monitor platelets by anticoagulation protocol: Yes   Plan:  Heparin 4000 units BOLUS Start heparin drip at 1100 units/hr 0730 HL Daily CBC/HL Monitor for bleeding   Narda Bonds 06/19/2018,10:41 PM

## 2018-06-19 NOTE — ED Provider Notes (Addendum)
[facility] EMERGENCY DEPARTMENT Provider Note   CSN: [id number] Arrival date & time: 06/19/18  1626     History   Chief Complaint Chief Complaint  Patient presents with  . Chest Pain    HPI Matthew Sargent. is a 57 y.o. male with a DMH of CKD, CAD with history of CABG, combined CHF, DM 2, HTN, and who presents with chest pain.  He says that today he was getting a stress test performed as part of the testing required prior to getting a kidney transplant when, after he received a IV medication during the stress test, he had very intense chest pain.  He describes the pain as pressure-like.  It is nonradiating and is not accompanied by diaphoresis, although he did have nausea when the pain first occurred today during a stress test.  The pain continued throughout the stress test and slowly subsided afterward.  He came to the emergency department because the people performing his stress test told him that imaging showed that he had a blockage and that he needed to go to the hospital immediately.  He says that he is currently being set up for dialysis and was told that getting another catheterization would cause him to need dialysis immediately.    Past Medical History:  Diagnosis Date  . Acute on chronic combined systolic and diastolic CHF (congestive heart failure) ([place]) 10/15/2017  . Chronic anemia   . Chronic combined systolic and diastolic CHF (congestive heart failure) ([place])   . Chronic stable angina ([place])   . CKD (chronic kidney disease), stage IV ([place])   . Coronary artery disease    a. INF MI 1999: PCI of RCA;  b.  extensive stenting of LAD;  c. s/p CABG with RIMA->RCA in 2006;  d. Stable, Low risk MV 05/2012; e. 09/2012 Cath: stable anatomy->med Rx. f. Abnl nuc 11/2013 -> med rx; g. 11/2013 Cath/PCI: s/p DES to RCA. h. Abnormal nuc 11/2016, pt declined cath.   . DM2 (diabetes mellitus, type 2) ([place])   . Dyslipidemia   . GERD (gastroesophageal reflux disease)   .  History of gout   . Hypertension   . Hypertensive heart disease   . Ischemic cardiomyopathy   . Mild aortic stenosis   . Myocardial infarction [facility]) 1999; 2002; 2003; 2006; ~ 2008  . OSA on CPAP    wears CPAP  . Polycystic kidney disease     Patient Active Problem List   Diagnosis Date Noted  . Unstable angina ([place]) 06/18/2018  . Leukocytosis 06/18/2018  . Chronic pain 06/18/2018  . Insomnia 06/18/2018  . Chest pain 06/17/2018  . Chronic heart failure with preserved ejection fraction ([place]) 06/06/2018  . Anemia due to stage 4 chronic kidney disease ([place]) 02/12/2018  . Coronary artery disease of native heart with stable angina pectoris ([place]) 10/25/2017  . Chronic diastolic heart failure ([place]) 10/25/2017  . Chronic anemia 10/15/2017  . Elevated troponin 10/15/2017  . CKD (chronic kidney disease) stage 4, GFR 15-29 ml/min ([facility]) 07/22/2017  . Hyperkalemia 04/01/2017  . Non-ST elevation (NSTEMI) myocardial infarction ([place]) 04/01/2017  . Chest pain with moderate risk of acute coronary syndrome 04/01/2017  . Hx of CABG   . Ischemic cardiomyopathy 02/21/2017  . HLD (hyperlipidemia)   . GERD (gastroesophageal reflux disease) 05/08/2015  . Gout 05/08/2015  . MI (myocardial infarction) ([place]) 04/19/2015  . CAD- S/P multiple PCIs, CABG   . Hypertensive heart disease   . Precordial chest pain 11/27/2013  .  Financial difficulty 03/13/2013  . Medically noncompliant 03/13/2013  . Type 2 diabetes with nephropathy ([place])   . Obesity-BMI 42 11/27/2012  . Polycystic kidney disease 09/15/2012  . OSA on CPAP 09/15/2012  . Hypertension 03/27/2011  . Hyperlipidemia associated with type 2 diabetes mellitus [facility])     Past Surgical History:  Procedure Laterality Date  . AV FISTULA PLACEMENT Left 05/29/2018   Procedure: ARTERIOVENOUS (AV) FISTULA CREATION  LEFT ARM.;  Surgeon: Serafina Mitchell, MD;  Location: [facility];  Service: Vascular;  Laterality: Left;  . CARDIAC CATHETERIZATION  2012  .  CARDIAC CATHETERIZATION  2013  . CARDIAC CATHETERIZATION  2014   LHC (1/14): total occlusion of distal LAD, diffuse disease in ramus, 70% proximal LCx, 50% proximal and mid RCA, 50% distal RCA, patent RIMA-RCA.  Marland Kitchen CORONARY ANGIOPLASTY WITH STENT PLACEMENT  1999 / 2002 / 2004 / 2006?  . CORONARY ARTERY BYPASS GRAFT  2006   CABG X?; in [place]  . CYSTECTOMY  1990's   off face  . HERNIA REPAIR     inguinal hernia  . LEFT HEART CATHETERIZATION WITH CORONARY/GRAFT ANGIOGRAM N/A 09/16/2012   Procedure: LEFT HEART CATHETERIZATION WITH [name];  Surgeon: [name], MD;  Location[facility] C[facility] LAB;  Service: Cardiovascular;  Laterality: N/A;  . LEFT HEART CATHETERIZATION WITH CORONARY/GRAFT ANGIOGRAM N/A 12/02/2013   Procedure: LEFT HEART CATHETERIZATION WITH [name];  Surgeon: [name], MD;  Location: [facility] CATH LAB;  Service: Cardiovascular;  Laterality: N/A;  . PERCUTANEOUS CORONARY STENT INTERVENTION (PCI-S) N/A 12/03/2013   Procedure: PERCUTANEOUS CORONARY STENT INTERVENTION (PCI-S);  Surgeon: [name], MD;  Location: [facility] CATH LAB;  Service: Cardiovascular;  Laterality: N/A;        Home Medications    Prior to Admission medications   Medication Sig Start Date End Date Taking? Authorizing Provider  acetaminophen (TYLENOL) 500 MG tablet Take 500 mg by mouth 3 (three) times daily as needed (for pain or headaches).    Yes [provider]  allopurinol (ZYLOPRIM) 100 MG tablet Take 100 mg by mouth daily.    Yes [provider]  amLODipine (NORVASC) 10 MG tablet Take 1 tablet (10 mg total) by mouth daily. 01/12/15  Yes Copland, Gay Filler, MD  aspirin EC 81 MG EC tablet Take 1 tablet (81 mg total) by mouth daily. 10/19/17  Yes Kilroy, Luke K, PA-C  atorvastatin (LIPITOR) 80 MG tablet Take 1 tablet (80 mg total) by mouth daily. 08/26/17  Yes Dunn, Dayna N, PA-C  carvedilol (COREG) 25 MG tablet Take 2 tablets (50 mg total) by mouth 2 (two)  times daily. 06/06/18  Yes End, Harrell Gave, MD  Cholecalciferol 2000 units TABS Take 2,000 Units by mouth daily.    Yes [provider]  clopidogrel (PLAVIX) 75 MG tablet TAKE ONE TABLET BY MOUTH ONCE DAILY WITH BREAKFAST Patient taking differently: Take 75 mg by mouth daily.  12/25/16  Yes End, Harrell Gave, MD  diphenhydrAMINE (BENADRYL) 25 MG tablet Take 25 mg by mouth every 6 (six) hours as needed for allergies or sleep.    Yes [provider]  furosemide (LASIX) 40 MG tablet Take 1 tablet (40 mg total) by mouth 2 (two) times daily. 12/27/17  Yes End, Harrell Gave, MD  gabapentin (NEURONTIN) 300 MG capsule Take 1 capsule (300 mg total) by mouth 2 (two) times daily. 04/03/17  Yes Eulogio Bear U, DO  hydrALAZINE (APRESOLINE) 50 MG tablet Take 1 tablet (50 mg total) by mouth 3 (three) times daily.  12/20/16  Yes End, Harrell Gave, MD  Insulin NPH Isophane & Regular (NOVOLIN 70/30 [place]) Inject 20 Units into the skin 2 (two) times daily.    Yes [provider]  isosorbide mononitrate (IMDUR) 120 MG 24 hr tablet Take 1 tablet (120 mg total) by mouth 2 (two) times daily. 08/26/17  Yes Dunn, Dayna N, PA-C  Multiple Vitamin (MULTIVITAMIN WITH MINERALS) TABS tablet Take 1 tablet by mouth daily.   Yes [provider]  nitroGLYCERIN (NITROSTAT) 0.4 MG SL tablet Place 1 tablet (0.4 mg total) under the tongue every 5 (five) minutes as needed for chest pain. 06/06/18  Yes End, Harrell Gave, MD  pantoprazole (PROTONIX) 40 MG tablet Take 40 mg by mouth daily.    Yes [provider]  ranitidine (ZANTAC) 300 MG tablet Take 300 mg by mouth at bedtime.   Yes [provider]  ranolazine (RANEXA) 500 MG 12 hr tablet Take 1 tablet (500 mg total) by mouth 2 (two) times daily. 03/29/17  Yes End, Harrell Gave, MD  Semaglutide [facility]) Inject 2.5 Units into the skin every Saturday.    Yes [provider]  traMADol (ULTRAM) 50 MG tablet Take 1 tablet (50 mg total) by  mouth every 6 (six) hours as needed for moderate pain. 05/29/18  Yes Ulyses Amor, PA-C  vitamin E 100 UNIT capsule Take 400 Units by mouth daily.   Yes [provider]  zolpidem (AMBIEN) 10 MG tablet Take 10 mg by mouth at bedtime as needed for sleep.  09/30/17  Yes [provider]  [place]. Devices MISC by Does not apply route. C-PAP    [provider]    Family History Family History  Problem Relation Age of Onset  . Lung cancer Mother   . Anemia Mother   . Polycystic kidney disease Mother   . Diabetes Father   . Heart attack Father 14       Died suddenly  . Heart failure Father   . Hyperlipidemia Father   . Hypertension Father   . Kidney failure Sister 5       Died  . Heart attack Sister   . Hypertension Sister   . Diabetes Sister   . Diabetes Brother   . Polycystic kidney disease Brother   . Diabetes Sister     Social History Social History   Tobacco Use  . Smoking status: Former Smoker    Types: Cigarettes  . Smokeless tobacco: Never Used  . Tobacco comment: smoked some as a teenager (high school) not even a pack a week  Substance Use Topics  . Alcohol use: No    Alcohol/week: 0.0 standard drinks    Comment: 09/15/2012 "drank a little when I was young"  . Drug use: No     Allergies   Cleocin [clindamycin hcl]; Glimepiride [amaryl]; Clarithromycin; Colchicine; and Ciprocin-fluocin-procin [fluocinolone acetonide]   Review of Systems Review of Systems  Constitutional: Negative for activity change, appetite change and fever.  HENT: Negative for congestion and rhinorrhea.   Respiratory: Negative for cough and shortness of breath.   Cardiovascular: Positive for chest pain.       Now resolved  Gastrointestinal: Positive for nausea and vomiting. Negative for abdominal pain and diarrhea.  Genitourinary: Negative for difficulty urinating and dysuria.  Neurological: Negative for dizziness.     Physical Exam Updated Vital Signs BP 137/87  (BP Location: Left Arm)   Pulse 97   Temp 97.7 F (36.5 C) (Oral)   Resp 16  Ht 5' 5.5" (1.664 m)   Wt 103 kg   SpO2 93%   BMI 37.20 kg/m   Physical Exam  Constitutional: He is oriented to person, place, and time. He appears well-developed and well-nourished.  Non-toxic appearance. He does not appear ill. No distress.  HENT:  Head: Normocephalic and atraumatic.  Eyes: Pupils are equal, round, and reactive to light. EOM are normal.  Neck: Normal range of motion. Neck supple. No JVD present.  Cardiovascular: Normal rate, regular rhythm and normal pulses.  Murmur heard.  Systolic murmur is present. Pulmonary/Chest: Effort normal and breath sounds normal. No respiratory distress. He has no decreased breath sounds. He has no wheezes. He has no rhonchi. He has no rales.  Abdominal: Soft. Bowel sounds are normal. There is no tenderness.  Musculoskeletal: Normal range of motion.       Right lower leg: Normal.       Left lower leg: Normal.  Neurological: He is alert and oriented to person, place, and time.  Skin: Skin is warm and dry.  Psychiatric: He has a normal mood and affect. His behavior is normal.     ED Treatments / Results  Labs (all labs ordered are listed, but only abnormal results are displayed) Labs Reviewed  BASIC METABOLIC PANEL - Abnormal; Notable for the following components:      Result Value   Glucose, Bld 176 (*)    BUN 51 (*)    Creatinine, Ser 4.04 (*)    GFR calc non Af Amer 15 (*)    GFR calc Af Amer 17 (*)    All other components within normal limits  CBC - Abnormal; Notable for the following components:   WBC 11.9 (*)    RBC 3.83 (*)    Hemoglobin 11.0 (*)    HCT 35.8 (*)    All other components within normal limits  I-STAT TROPONIN, ED    EKG EKG Interpretation  Date/Time:  Thursday June 19 2018 16:44:02 EDT Ventricular Rate:  102 PR Interval:    QRS Duration: 143 QT Interval:  378 QTC Calculation: 493 R Axis:   -67 Text  Interpretation:  Sinus tachycardia Probable left atrial enlargement Left bundle branch block No significant change since last tracing Confirmed by Deno Etienne [phone]) on 06/19/2018 5:18:02 PM   Radiology Dg Chest 2 View  Result Date: 06/19/2018 CLINICAL DATA:  Chest pain EXAM: CHEST - 2 VIEW COMPARISON:  06/17/2018 FINDINGS: Prior median sternotomy. Heart is normal size. No confluent airspace opacities or effusions. IMPRESSION: No active cardiopulmonary disease. Electronically Signed   By: Rolm Baptise M.D.   On: 06/19/2018 17:38   Dg Chest 2 View  Result Date: 06/17/2018 CLINICAL DATA:  Chest pain. EXAM: CHEST - 2 VIEW COMPARISON:  Radiograph of October 16, 2017. FINDINGS: Stable cardiomediastinal silhouette. Sternotomy wires are noted. No pneumothorax or pleural effusion is noted. Both lungs are clear. The visualized skeletal structures are unremarkable. IMPRESSION: No active cardiopulmonary disease. Electronically Signed   By: Marijo Conception, M.D.   On: 06/17/2018 20:27    Procedures Procedures (including critical care time)  Medications Ordered in ED Medications - No data to display   Initial Impression / Assessment and Plan / ED Course  I have reviewed the triage vital signs and the nursing notes.  Pertinent labs & imaging results that were available during my care of the patient were reviewed by me and considered in my medical decision making (see chart for details).  Patient significant history of cardiac disease in addition to his concerning symptoms today during his stress test warrant admission.  Reassuring that patient no longer has cardiac symptoms.  Consulted cardiology, who agreed that patient should be admitted and will need catheterization, although he will likely need dialysis due to the contrast of the catheterization.  Due to patient's severe CKD and diabetes, will admit to medicine with cardiology as consultants for further management.  Final Clinical  Impressions(s) / ED Diagnoses   Final diagnoses:  Chest pain, unspecified type    ED Discharge Orders    None       Kathrene Alu, MD 06/19/18 [id number]    Kathrene Alu, MD 06/19/18 [place], [place], DO 06/23/18 [phone]

## 2018-06-19 NOTE — Progress Notes (Signed)
Patient arrived from ED. Alert and oriented to room and surroundings. Admitting DX chest pain.

## 2018-06-19 NOTE — Progress Notes (Signed)
Pt set up with CPAP for the night on auto settings due to patient not knowing home settings.  Pt requested nasal mask- No issues noted at time of set up.

## 2018-06-19 NOTE — Plan of Care (Signed)
  Problem: Education: Goal: Knowledge of General Education information will improve Description Including pain rating scale, medication(s)/side effects and non-pharmacologic comfort measures Outcome: Progressing   Problem: Education: Goal: Knowledge of General Education information will improve Description Including pain rating scale, medication(s)/side effects and non-pharmacologic comfort measures Outcome: Progressing   

## 2018-06-19 NOTE — ED Triage Notes (Signed)
Pt to ED from [facility] after reported having a stress test that he did not pass this am.  Pt was seen here 2 days ago for chest pain and discharged yesterday.  Pt was given NTG spray x's 2 and ASA 381mg  by EMS

## 2018-06-19 NOTE — Consult Note (Signed)
Cardiology Consultation:   Patient ID: Matthew CHIZMAR Sr. MRN: [id number]; DOB: Feb 24, 1961  Admit date: 06/19/2018 Date of Consult: 06/19/2018  Primary Care Provider: [name], MD Primary Cardiologist: Nelva Bush, MD  Primary Electrophysiologist:  None   Patient Profile:   Matthew Mcclimans. is a 57 y.o. male with a hx of known CAD (see details below) who is being seen today for the evaluation of chest pain with stress test at the request of Dr. Marlowe Sax.  History of Present Illness:   Matthew Rowe is well known to me; he just left yesterday as he was planned for a stress test at the [place] today. I cannot see the records or report of the stress test, but the information he brings with him is that he developed severe chest pain after administration of the regadenason that did not improve with aminophylline or caffeine. Reportedly the images showed a blockage, but I do not have access to these. He endorses mild chest pain when he changes position in the bed.  Otherwise no changes from yesterday.  Past Medical History:  Diagnosis Date  . Acute on chronic combined systolic and diastolic CHF (congestive heart failure) ([place]) 10/15/2017  . Chronic anemia   . Chronic combined systolic and diastolic CHF (congestive heart failure) ([place])   . Chronic stable angina ([place])   . CKD (chronic kidney disease), stage IV ([place])   . Coronary artery disease    a. INF MI 1999: PCI of RCA;  b.  extensive stenting of LAD;  c. s/p CABG with RIMA->RCA in 2006;  d. Stable, Low risk MV 05/2012; e. 09/2012 Cath: stable anatomy->med Rx. f. Abnl nuc 11/2013 -> med rx; g. 11/2013 Cath/PCI: s/p DES to RCA. h. Abnormal nuc 11/2016, pt declined cath.   . DM2 (diabetes mellitus, type 2) ([place])   . Dyslipidemia   . GERD (gastroesophageal reflux disease)   . History of gout   . Hypertension   . Hypertensive heart disease   . Ischemic cardiomyopathy   . Mild aortic stenosis   . Myocardial infarction [facility]) 1999; 2002;  2003; 2006; ~ 2008  . OSA on CPAP    wears CPAP  . Polycystic kidney disease     Past Surgical History:  Procedure Laterality Date  . AV FISTULA PLACEMENT Left 05/29/2018   Procedure: ARTERIOVENOUS (AV) FISTULA CREATION  LEFT ARM.;  Surgeon: Serafina Mitchell, MD;  Location: [place];  Service: Vascular;  Laterality: Left;  . CARDIAC CATHETERIZATION  2012  . CARDIAC CATHETERIZATION  2013  . CARDIAC CATHETERIZATION  2014   LHC (1/14): total occlusion of distal LAD, diffuse disease in ramus, 70% proximal LCx, 50% proximal and mid RCA, 50% distal RCA, patent RIMA-RCA.  Marland Kitchen CORONARY ANGIOPLASTY WITH STENT PLACEMENT  1999 / 2002 / 2004 / 2006?  . CORONARY ARTERY BYPASS GRAFT  2006   CABG X?; in [place]  . CYSTECTOMY  1990's   off face  . HERNIA REPAIR     inguinal hernia  . LEFT HEART CATHETERIZATION WITH CORONARY/GRAFT ANGIOGRAM N/A 09/16/2012   Procedure: LEFT HEART CATHETERIZATION WITH [name];  Surgeon: [name], MD;  Location: [facility] CATH LAB;  Service: Cardiovascular;  Laterality: N/A;  . LEFT HEART CATHETERIZATION WITH CORONARY/GRAFT ANGIOGRAM N/A 12/02/2013   Procedure: LEFT HEART CATHETERIZATION WITH [name];  Surgeon: [name], MD;  Location: [place] CATH LAB;  Service: Cardiovascular;  Laterality: N/A;  . PERCUTANEOUS CORONARY STENT INTERVENTION (PCI-S) N/A 12/03/2013   Procedure: PERCUTANEOUS CORONARY  STENT INTERVENTION (PCI-S);  Surgeon: [name], MD;  Location: [facility] CATH LAB;  Service: Cardiovascular;  Laterality: N/A;     Home Medications:  Prior to Admission medications   Medication Sig Start Date End Date Taking? Authorizing Provider  acetaminophen (TYLENOL) 500 MG tablet Take 500 mg by mouth 3 (three) times daily as needed (for pain or headaches).    Yes [provider]  allopurinol (ZYLOPRIM) 100 MG tablet Take 100 mg by mouth daily.    Yes [provider]  amLODipine (NORVASC) 10 MG tablet Take 1 tablet (10 mg  total) by mouth daily. 01/12/15  Yes Copland, Gay Filler, MD  aspirin EC 81 MG EC tablet Take 1 tablet (81 mg total) by mouth daily. 10/19/17  Yes Kilroy, Luke K, PA-C  atorvastatin (LIPITOR) 80 MG tablet Take 1 tablet (80 mg total) by mouth daily. 08/26/17  Yes Dunn, Dayna N, PA-C  carvedilol (COREG) 25 MG tablet Take 2 tablets (50 mg total) by mouth 2 (two) times daily. 06/06/18  Yes End, Harrell Gave, MD  Cholecalciferol 2000 units TABS Take 2,000 Units by mouth daily.    Yes [provider]  clopidogrel (PLAVIX) 75 MG tablet TAKE ONE TABLET BY MOUTH ONCE DAILY WITH BREAKFAST Patient taking differently: Take 75 mg by mouth daily.  12/25/16  Yes End, Harrell Gave, MD  diphenhydrAMINE (BENADRYL) 25 MG tablet Take 25 mg by mouth every 6 (six) hours as needed for allergies or sleep.    Yes [provider]  furosemide (LASIX) 40 MG tablet Take 1 tablet (40 mg total) by mouth 2 (two) times daily. 12/27/17  Yes End, Harrell Gave, MD  gabapentin (NEURONTIN) 300 MG capsule Take 1 capsule (300 mg total) by mouth 2 (two) times daily. 04/03/17  Yes Eulogio Bear U, DO  hydrALAZINE (APRESOLINE) 50 MG tablet Take 1 tablet (50 mg total) by mouth 3 (three) times daily. 12/20/16  Yes End, Harrell Gave, MD  Insulin NPH Isophane & Regular (NOVOLIN 70/30 [place]) Inject 20 Units into the skin 2 (two) times daily.    Yes [provider]  isosorbide mononitrate (IMDUR) 120 MG 24 hr tablet Take 1 tablet (120 mg total) by mouth 2 (two) times daily. 08/26/17  Yes Dunn, Dayna N, PA-C  Multiple Vitamin (MULTIVITAMIN WITH MINERALS) TABS tablet Take 1 tablet by mouth daily.   Yes [provider]  nitroGLYCERIN (NITROSTAT) 0.4 MG SL tablet Place 1 tablet (0.4 mg total) under the tongue every 5 (five) minutes as needed for chest pain. 06/06/18  Yes End, Harrell Gave, MD  pantoprazole (PROTONIX) 40 MG tablet Take 40 mg by mouth daily.    Yes [provider]  ranitidine (ZANTAC) 300 MG tablet Take 300 mg  by mouth at bedtime.   Yes [provider]  ranolazine (RANEXA) 500 MG 12 hr tablet Take 1 tablet (500 mg total) by mouth 2 (two) times daily. 03/29/17  Yes End, Harrell Gave, MD  Semaglutide [facility]) Inject 2.5 Units into the skin every Saturday.    Yes [provider]  traMADol (ULTRAM) 50 MG tablet Take 1 tablet (50 mg total) by mouth every 6 (six) hours as needed for moderate pain. 05/29/18  Yes Ulyses Amor, PA-C  vitamin E 100 UNIT capsule Take 400 Units by mouth daily.   Yes [provider]  zolpidem (AMBIEN) 10 MG tablet Take 10 mg by mouth at bedtime as needed for sleep.  09/30/17  Yes [provider]  [place]. Devices MISC by Does not apply  route. C-PAP    [provider]    Inpatient Medications: Scheduled Meds:  Continuous Infusions:  PRN Meds:   Allergies:    Allergies  Allergen Reactions  . Cleocin [Clindamycin Hcl] Anaphylaxis and Swelling  . Glimepiride [Amaryl] Other (See Comments)    Elevates liver function   . Clarithromycin Itching and Other (See Comments)    "Biaxin" Eyes itch and burn  . Colchicine Other (See Comments)    Affected kidneys   . Ciprocin-Fluocin-Procin [Fluocinolone Acetonide] Rash    Social History:   Social History   Socioeconomic History  . Marital status: Married    Spouse name: [name]  . Number of children: 5  . Years of education: Not on file  . Highest education level: Not on file  Occupational History  . Occupation: retired    Comment: Chartered loss adjuster  Social Needs  . Financial resource strain: Not on file  . Food insecurity:    Worry: Not on file    Inability: Not on file  . Transportation needs:    Medical: Not on file    Non-medical: Not on file  Tobacco Use  . Smoking status: Former Smoker    Types: Cigarettes  . Smokeless tobacco: Never Used  . Tobacco comment: smoked some as a teenager (high school) not even a pack a week  Substance and Sexual Activity  . Alcohol  use: No    Alcohol/week: 0.0 standard drinks    Comment: 09/15/2012 "drank a little when I was young"  . Drug use: No  . Sexual activity: Yes  Lifestyle  . Physical activity:    Days per week: Not on file    Minutes per session: Not on file  . Stress: Not on file  Relationships  . Social connections:    Talks on phone: Not on file    Gets together: Not on file    Attends religious service: Not on file    Active member of club or organization: Not on file    Attends meetings of clubs or organizations: Not on file    Relationship status: Not on file  . Intimate partner violence:    Fear of current or ex partner: Not on file    Emotionally abused: Not on file    Physically abused: Not on file    Forced sexual activity: Not on file  Other Topics Concern  . Not on file  Social History Narrative   Married and lives with wife in [place]. On disability.    Consumes about 2 Coke Zeros a day     Family History:    Family History  Problem Relation Age of Onset  . Lung cancer Mother   . Anemia Mother   . Polycystic kidney disease Mother   . Diabetes Father   . Heart attack Father 87       Died suddenly  . Heart failure Father   . Hyperlipidemia Father   . Hypertension Father   . Kidney failure Sister 5       Died  . Heart attack Sister   . Hypertension Sister   . Diabetes Sister   . Diabetes Brother   . Polycystic kidney disease Brother   . Diabetes Sister      ROS:  Please see the history of present illness.  Review of Systems  Constitutional: Negative for chills and fever.  Eyes: Negative for double vision.  Respiratory: Negative for cough and sputum production.   Cardiovascular: Positive for chest  pain. Negative for palpitations, orthopnea, claudication, leg swelling and PND.  Gastrointestinal: Negative for abdominal pain and blood in stool.  Genitourinary: Negative for hematuria.  Musculoskeletal: Negative for falls.  Skin: Negative for rash.  Neurological: Negative  for sensory change and loss of consciousness.  Endo/Heme/Allergies: Does not bruise/bleed easily.   All other ROS reviewed and negative.     Physical Exam/Data:   Vitals:   06/19/18 1655 06/19/18 1656  BP: 137/87   Pulse: 97   Resp: 16   Temp: 97.7 F (36.5 C)   TempSrc: Oral   SpO2: 93%   Weight:  103 kg  Height:  5' 5.5" (1.664 m)   No intake or output data in the 24 hours ending 06/19/18 1857 Filed Weights   06/19/18 1656  Weight: 103 kg   Body mass index is 37.2 kg/m.  General:  Well nourished, well developed, in no acute distress HEENT: normal Lymph: no adenopathy Neck: no JVD Endocrine:  No thryomegaly Vascular: No carotid bruits; RA pulses 2+ bilaterally without bruits  Cardiac:  normal S1, S2; RRR; no murmur Lungs:  clear to auscultation bilaterally, no wheezing, rhonchi or rales  Abd: soft, nontender, no hepatomegaly  Ext: no edema Musculoskeletal:  No deformities, BUE and BLE strength normal and equal Skin: warm and dry  Neuro:  CNs 2-12 intact, no focal abnormalities noted Psych:  Normal affect   EKG:  The EKG was personally reviewed and demonstrates:  Sinus rhythm, LBBB, no sgarbossa criteria Telemetry:  Telemetry was personally reviewed and demonstrates:  Sinus rhythm  Relevant CV Studies: Per Dr. Darnelle Bos notes Cath/PCI:  LHC (12/02/13): LMCA with 30% distal stenosis. LAD with multiple stents and 50% ostial stenosis followed by 20% diffuse in-stent restenosis proximally. There is diffuse 50% in-stent restenosis in the mid and distal segments. The distal LAD is occluded. LCx is small with 40-50% ostial stenosis. OM 2 has a 50% ostial stenosis. Ramus intermedius is a large vessel with 70-80% stenosis involving its superior branch. 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 There is diffuse 20% proximal disease. There is a 90% stenosis involving the distal vessel just beyond the anastomosis with the RIMA. 60% stenosis extending into the RPDA is  also evident. RIMA to RCA is widely patent with aforementioned stenosis just beyond the distal anastomosis.  PCI (12/03/13): Successful PCI to the mid/distal RCA with overlapping Promus drug-eluting stents (2.25 x 24 and 2.25 x 16 mm).  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  Laboratory Data:  Chemistry Recent Labs  Lab 06/17/18 1934 06/19/18 1647  NA 136 137  K 4.1 4.7  CL 104 102  CO2 23 23  GLUCOSE 186* 176*  BUN 45* 51*  CREATININE 3.89* 4.04*  CALCIUM 9.1 9.3  GFRNONAA 16* 15*  GFRAA 18* 17*  ANIONGAP 9 12    No results for input(s): PROT, ALBUMIN, AST, ALT, ALKPHOS, BILITOT in the last 168 hours. Hematology Recent Labs  Lab 06/17/18 1934 06/19/18 1647  WBC 10.7* 11.9*  RBC 3.76* 3.83*  HGB 10.8* 11.0*  HCT 34.8* 35.8*  MCV 92.6 93.5  MCH 28.7 28.7  MCHC 31.0 30.7  RDW 14.3 14.2  PLT 251 262   Cardiac Enzymes Recent Labs  Lab 06/18/18 0332 06/18/18 0856  TROPONINI <0.03 <0.03    Recent Labs  Lab 06/17/18 1949 06/19/18 1651  TROPIPOC 0.00 0.01    BNP Recent Labs  Lab 06/17/18 1934  BNP  181.8*    DDimer No results for input(s): DDIMER in the last 168 hours.  Radiology/Studies:  Dg Chest 2 View  Result Date: 06/19/2018 CLINICAL DATA:  Chest pain EXAM: CHEST - 2 VIEW COMPARISON:  06/17/2018 FINDINGS: Prior median sternotomy. Heart is normal size. No confluent airspace opacities or effusions. IMPRESSION: No active cardiopulmonary disease. Electronically Signed   By: Rolm Baptise M.D.   On: 06/19/2018 17:38   Dg Chest 2 View  Result Date: 06/17/2018 CLINICAL DATA:  Chest pain. EXAM: CHEST - 2 VIEW COMPARISON:  Radiograph of October 16, 2017. FINDINGS: Stable cardiomediastinal silhouette. Sternotomy wires are noted. No pneumothorax or pleural effusion is noted. Both lungs are clear. The visualized skeletal structures are unremarkable. IMPRESSION: No active cardiopulmonary disease. Electronically  Signed   By: Marijo Conception, M.D.   On: 06/17/2018 20:27    Assessment and Plan:   Chest pain with stress test, reported positive on images  We discussed that cath will like necessitate dialysis given his end stage renal disease. Recommendations -heparin drip, statin, aspirin, home carvedilol, home plavix -trend troponins -he can eat tonight, but NPO after midnight -medicine admission with consult to nephrology in AM. He wants his nephrologist Dr. Justin Mend involved in discussion. -prehydration with IV fluids tonight in case of possible cath tomorrow -I will touch base with Dr. Saunders Revel and Dr. Irish Lack in the AM to discuss -I anticipate that either he will remain over the weekend on heparin/hydration with cath on Monday, or he will have cath tomorrow and remain over the weekend to monitor his renal function and urine output.  For questions or updates, please contact [place] Please consult [email or web address] for contact info under   Signed, Buford Dresser, MD  06/19/2018 6:57 PM

## 2018-06-20 ENCOUNTER — Inpatient Hospital Stay (HOSPITAL_COMMUNITY): Payer: Medicare HMO

## 2018-06-20 LAB — URINALYSIS, ROUTINE W REFLEX MICROSCOPIC
BILIRUBIN URINE: NEGATIVE
GLUCOSE, UA: NEGATIVE mg/dL
HGB URINE DIPSTICK: NEGATIVE
KETONES UR: NEGATIVE mg/dL
LEUKOCYTES UA: NEGATIVE
Nitrite: NEGATIVE
PROTEIN: NEGATIVE mg/dL
Specific Gravity, Urine: 1.006 (ref 1.005–1.030)
pH: 6 (ref 5.0–8.0)

## 2018-06-20 LAB — BLOOD GAS, ARTERIAL
Acid-base deficit: 2.4 mmol/L — ABNORMAL HIGH (ref 0.0–2.0)
Bicarbonate: 21.6 mmol/L (ref 20.0–28.0)
Drawn by: 280981
O2 Content: 4 L/min
O2 Saturation: 83.7 %
PCO2 ART: 35 mmHg (ref 32.0–48.0)
Patient temperature: 98.6
pH, Arterial: 7.406 (ref 7.350–7.450)
pO2, Arterial: 49.2 mmHg — ABNORMAL LOW (ref 83.0–108.0)

## 2018-06-20 LAB — HEPARIN LEVEL (UNFRACTIONATED)
HEPARIN UNFRACTIONATED: 0.92 [IU]/mL — AB (ref 0.30–0.70)
Heparin Unfractionated: 0.51 IU/mL (ref 0.30–0.70)

## 2018-06-20 LAB — CBC
HEMATOCRIT: 33.8 % — AB (ref 39.0–52.0)
Hemoglobin: 10.8 g/dL — ABNORMAL LOW (ref 13.0–17.0)
MCH: 29.3 pg (ref 26.0–34.0)
MCHC: 32 g/dL (ref 30.0–36.0)
MCV: 91.6 fL (ref 80.0–100.0)
Platelets: 252 10*3/uL (ref 150–400)
RBC: 3.69 MIL/uL — ABNORMAL LOW (ref 4.22–5.81)
RDW: 14.2 % (ref 11.5–15.5)
WBC: 11.7 10*3/uL — ABNORMAL HIGH (ref 4.0–10.5)
nRBC: 0 % (ref 0.0–0.2)

## 2018-06-20 LAB — BASIC METABOLIC PANEL
Anion gap: 12 (ref 5–15)
BUN: 51 mg/dL — ABNORMAL HIGH (ref 6–20)
CALCIUM: 9 mg/dL (ref 8.9–10.3)
CHLORIDE: 108 mmol/L (ref 98–111)
CO2: 20 mmol/L — AB (ref 22–32)
Creatinine, Ser: 4.05 mg/dL — ABNORMAL HIGH (ref 0.61–1.24)
GFR calc Af Amer: 17 mL/min — ABNORMAL LOW (ref >60)
GFR calc non Af Amer: 15 mL/min — ABNORMAL LOW (ref >60)
GLUCOSE: 146 mg/dL — AB (ref 70–99)
Potassium: 4.6 mmol/L (ref 3.5–5.1)
Sodium: 140 mmol/L (ref 135–145)

## 2018-06-20 LAB — GLUCOSE, CAPILLARY
Glucose-Capillary: 104 mg/dL — ABNORMAL HIGH (ref 70–99)
Glucose-Capillary: 221 mg/dL — ABNORMAL HIGH (ref 70–99)
Glucose-Capillary: 119 mg/dL — ABNORMAL HIGH (ref 70–99)
Glucose-Capillary: 153 mg/dL — ABNORMAL HIGH (ref 70–99)

## 2018-06-20 LAB — TROPONIN I
TROPONIN I: 0.22 ng/mL — AB (ref <0.03)
Troponin I: 0.15 ng/mL (ref <0.03)
Troponin I: 0.19 ng/mL (ref <0.03)

## 2018-06-20 LAB — PROCALCITONIN: PROCALCITONIN: 0.1 ng/mL

## 2018-06-20 LAB — LACTIC ACID, PLASMA: Lactic Acid, Venous: 2.6 mmol/L (ref 0.5–1.9)

## 2018-06-20 MED ORDER — HYDROMORPHONE HCL 1 MG/ML IJ SOLN
0.5000 mg | INTRAMUSCULAR | Status: DC | PRN
  Administered 2018-06-21 – 2018-06-25 (×5): 0.5 mg via INTRAVENOUS
  Filled 2018-06-20 (×6): qty 1

## 2018-06-20 MED ORDER — NITROGLYCERIN IN D5W 200-5 MCG/ML-% IV SOLN
0.0000 ug/min | INTRAVENOUS | Status: DC
  Administered 2018-06-20: 10 ug/min via INTRAVENOUS
  Administered 2018-06-21 – 2018-06-24 (×3): 30 ug/min via INTRAVENOUS
  Administered 2018-06-25: 25 ug/min via INTRAVENOUS
  Filled 2018-06-20 (×4): qty 250

## 2018-06-20 MED ORDER — FUROSEMIDE 10 MG/ML IJ SOLN
INTRAMUSCULAR | Status: AC
  Filled 2018-06-20: qty 6

## 2018-06-20 MED ORDER — GUAIFENESIN-DM 100-10 MG/5ML PO SYRP
5.0000 mL | ORAL_SOLUTION | ORAL | Status: DC | PRN
  Administered 2018-06-20: 5 mL via ORAL
  Filled 2018-06-20: qty 5

## 2018-06-20 MED ORDER — NITROGLYCERIN IN D5W 200-5 MCG/ML-% IV SOLN
INTRAVENOUS | Status: AC
  Administered 2018-06-21: 30 ug/min via INTRAVENOUS
  Filled 2018-06-20: qty 250

## 2018-06-20 MED ORDER — MORPHINE SULFATE (PF) 2 MG/ML IV SOLN: 1.0000 mg | INTRAVENOUS | Status: DC | PRN

## 2018-06-20 MED ORDER — MORPHINE SULFATE (PF) 2 MG/ML IV SOLN
2.0000 mg | Freq: Once | INTRAVENOUS | Status: AC
  Administered 2018-06-20: 2 mg via INTRAVENOUS
  Filled 2018-06-20: qty 1

## 2018-06-20 MED ORDER — SODIUM CHLORIDE 0.9 % IV SOLN
3.0000 g | Freq: Two times a day (BID) | INTRAVENOUS | Status: DC
  Administered 2018-06-20 – 2018-06-26 (×12): 3 g via INTRAVENOUS
  Filled 2018-06-20 (×13): qty 3

## 2018-06-20 MED ORDER — FUROSEMIDE 10 MG/ML IJ SOLN
60.0000 mg | Freq: Once | INTRAMUSCULAR | Status: AC
  Administered 2018-06-20: 60 mg via INTRAVENOUS

## 2018-06-20 MED ORDER — INSULIN ASPART 100 UNIT/ML ~~LOC~~ SOLN
0.0000 [IU] | Freq: Three times a day (TID) | SUBCUTANEOUS | Status: DC
  Administered 2018-06-20: 3 [IU] via SUBCUTANEOUS
  Administered 2018-06-21 – 2018-06-22 (×3): 1 [IU] via SUBCUTANEOUS
  Administered 2018-06-22: 2 [IU] via SUBCUTANEOUS
  Administered 2018-06-23: 1 [IU] via SUBCUTANEOUS
  Administered 2018-06-24: 5 [IU] via SUBCUTANEOUS
  Administered 2018-06-24: 3 [IU] via SUBCUTANEOUS
  Administered 2018-06-24: 2 [IU] via SUBCUTANEOUS
  Administered 2018-06-25 (×2): 1 [IU] via SUBCUTANEOUS
  Administered 2018-06-26: 2 [IU] via SUBCUTANEOUS

## 2018-06-20 MED ORDER — FUROSEMIDE 10 MG/ML IJ SOLN
60.0000 mg | Freq: Two times a day (BID) | INTRAMUSCULAR | Status: DC
  Administered 2018-06-20 – 2018-06-21 (×2): 60 mg via INTRAVENOUS
  Filled 2018-06-20 (×2): qty 6

## 2018-06-20 MED ORDER — INSULIN GLARGINE 100 UNIT/ML ~~LOC~~ SOLN
10.0000 [IU] | Freq: Every day | SUBCUTANEOUS | Status: DC
  Administered 2018-06-20 – 2018-06-25 (×6): 10 [IU] via SUBCUTANEOUS
  Filled 2018-06-20 (×6): qty 0.1

## 2018-06-20 NOTE — Progress Notes (Signed)
ANTICOAGULATION CONSULT NOTE - Follow Up Consult  Pharmacy Consult for Heparin Indication: chest pain/ACS  Allergies  Allergen Reactions  . Cleocin [Clindamycin Hcl] Anaphylaxis and Swelling  . Glimepiride [Amaryl] Other (See Comments)    Elevates liver function   . Clarithromycin Itching and Other (See Comments)    "Biaxin" Eyes itch and burn  . Colchicine Other (See Comments)    Affected kidneys   . Ciprocin-Fluocin-Procin [Fluocinolone Acetonide] Rash    Patient Measurements: Height: 5\' 5"  (165.1 cm) Weight: 234 lb 6.4 oz (106.3 kg) IBW/kg (Calculated) : 61.5 Heparin Dosing Weight: 87 kg  Vital Signs: Temp: 98.5 F (36.9 C) (10/11 2011) Temp Source: Oral (10/11 2011) BP: 149/91 (10/11 2011) Pulse Rate: 104 (10/11 2011)  Labs: Recent Labs    06/18/18 0722  06/19/18 1647 06/19/18 2249 06/20/18 0449 06/20/18 0727 06/20/18 1036 06/20/18 1928  HGB  --   --  11.0*  --  10.8*  --   --   --   HCT  --   --  35.8*  --  33.8*  --   --   --   PLT  --   --  262  --  252  --   --   --   HEPARINUNFRC 0.69  --   --   --   --  0.92*  --  0.51  CREATININE  --   --  4.04*  --  4.05*  --   --   --   TROPONINI  --    < >  --  0.12* 0.15*  --  0.19*  --    < > = values in this interval not displayed.    Estimated Creatinine Clearance: 22.3 mL/min (A) (by C-G formula based on SCr of 4.05 mg/dL (H)).  Assessment:  57 yr old male on IV heparin for chest pain/ACS.   Chest pain this am, now on IV Nitroglycerin.   CKD IV, hydrating for possible cath today or Monday.  Heparin level is at goal (0.5) on 950 units/hr. No issues noted.   Goal of Therapy:  Heparin level 0.3-0.7 units/ml Monitor platelets by anticoagulation protocol: Yes   Plan:   Continue heparin drip at 950 units/hr  Daily heparin level and CBC while on heparin.  Follow up cath plans.   Erin Hearing PharmD., BCPS Clinical Pharmacist 06/20/2018 8:28 PM

## 2018-06-20 NOTE — Progress Notes (Signed)
Placed patient on BIPAP due to increased work of breathing and arterial blood gas results.

## 2018-06-20 NOTE — Progress Notes (Signed)
MD notes noted. I made Dr. Tyrell Antonio aware that patient had become markedly weak within a short period of time. Generalized weakness. Pt afebrile 98.7. I notified the cardiologist of issue. EKG done with changes and cards came to bedside. Dr. Tyrell Antonio came to bedside. Labs, CXR done and meds given. Pt was complaining of 10/10 chest pain at one time but pain came down to a 2/10 with increase of nitro. No further pain after dilaudid given. Plan for CCM to come see patient. Patients wife updated on phone and coming to see patient

## 2018-06-20 NOTE — Progress Notes (Signed)
Late entry: Pt had to sit in the chair to catch his breath. He stated that he couldn't lay down. He put his cpap on to help with his breath. He stated that he had heaviness in his chest related to shortness of breath. He said it was not chest pain like this morning. He stated he felt like he needed lasix. I could hear fine crackles in his lower lobe on the right side but otherwise, he is diminished. His sats have been on the low end of the 90s most of my shift. I paged Dr. Tyrell Antonio and she came to see patient and Renal Md came at that time as well. Dr. Loleta Books with renal came and agreed to go ahead with lasix. I paged Dr. Tyrell Antonio and she was made aware of chest xray results. Orders received. Will closely monitor. Patient stated he was feeling better after receiving the IV lasix.

## 2018-06-20 NOTE — Consult Note (Signed)
Matthew Kirks Sr. Admit Date: 06/19/2018 06/20/2018 Rexene Agent Requesting Physician:  Tyrell Antonio MD  Reason for Consult:  CKD4, NSTEMI HPI:  26M CKD4 followed by Dr. Justin Mend at [facility] with primary disease being autosomal dominant polycystic kidney disease.  Patient was admitted on 10/10 after developing persistent angina during nuclear stress test.  He has known coronary disease with history of CABG.  Chest pain has persisted and troponins have become weakly positive.  Cardiac catheterization has been recommended by cardiology.  Patient has not consented, wanting involvement of nephrology first given his advanced chronic kidney disease.  Patient's baseline creatinine currently appears to be somewhere between 4 and 4.5, which he is at currently.  Patient underwent left upper extremity first stage of DVT on 05/29/2018 with vascular surgery.  Patient was hydrated overnight with normal saline and diuretics were held in anticipation of cardiac catheterization.  He now has some increased dyspnea and cough primary service has ordered a chest x-ray as well as furosemide.  IV fluids have been stopped  Other past history includes type 2 diabetes, hypertension.  He has mild secondary hyperparathyroidism not requiring active vitamin D.  Creat (mg/dL)  Date Value  04/19/2016 2.47 (H)  01/25/2016 2.53 (H)  01/23/2016 2.72 (H)  11/24/2014 1.92 (H)  10/11/2014 1.98 (H)  09/30/2013 2.00 (H)  05/28/2013 1.96 (H)  03/27/2012 1.75 (H)   Creatinine, Ser (mg/dL)  Date Value  06/20/2018 4.05 (H)  06/19/2018 4.04 (H)  06/17/2018 3.89 (H)  10/18/2017 3.95 (H)  10/17/2017 3.78 (H)  10/16/2017 3.55 (H)  10/15/2017 3.61 (H)  08/09/2017 3.59 (H)  04/02/2017 2.94 (H)  04/01/2017 3.13 (H)  ] I/Os: I/O last 3 completed shifts: In: 360 [P.O.:360] Out: 700 [Urine:700]   ROS Balance of 12 systems is negative w/ exceptions as above  PMH  Past Medical History:  Diagnosis Date  . Acute on chronic  combined systolic and diastolic CHF (congestive heart failure) ([place]) 10/15/2017  . Chronic anemia   . Chronic combined systolic and diastolic CHF (congestive heart failure) ([place])   . Chronic stable angina ([place])   . CKD (chronic kidney disease), stage IV ([place])   . Coronary artery disease    a. INF MI 1999: PCI of RCA;  b.  extensive stenting of LAD;  c. s/p CABG with RIMA->RCA in 2006;  d. Stable, Low risk MV 05/2012; e. 09/2012 Cath: stable anatomy->med Rx. f. Abnl nuc 11/2013 -> med rx; g. 11/2013 Cath/PCI: s/p DES to RCA. h. Abnormal nuc 11/2016, pt declined cath.   . DM2 (diabetes mellitus, type 2) ([place])   . Dyslipidemia   . GERD (gastroesophageal reflux disease)   . History of gout   . Hypertension   . Hypertensive heart disease   . Ischemic cardiomyopathy   . Mild aortic stenosis   . Myocardial infarction [facility]) 1999; 2002; 2003; 2006; ~ 2008  . OSA on CPAP    wears CPAP  . Polycystic kidney disease    PSH  Past Surgical History:  Procedure Laterality Date  . AV FISTULA PLACEMENT Left 05/29/2018   Procedure: ARTERIOVENOUS (AV) FISTULA CREATION  LEFT ARM.;  Surgeon: Serafina Mitchell, MD;  Location: [place];  Service: Vascular;  Laterality: Left;  . CARDIAC CATHETERIZATION  2012  . CARDIAC CATHETERIZATION  2013  . CARDIAC CATHETERIZATION  2014   LHC (1/14): total occlusion of distal LAD, diffuse disease in ramus, 70% proximal LCx, 50% proximal and mid RCA, 50% distal RCA, patent RIMA-RCA.  Marland Kitchen CORONARY  [place] / 2002 / 2004 / 2006?  . CORONARY ARTERY BYPASS GRAFT  2006   CABG X?; in [place]  . CYSTECTOMY  1990's   off face  . HERNIA REPAIR     inguinal hernia  . LEFT HEART CATHETERIZATION WITH CORONARY/GRAFT ANGIOGRAM N/A 09/16/2012   Procedure: LEFT HEART CATHETERIZATION WITH [name];  Surgeon: [name], MD;  Location[facility] C[facility] LAB;  Service: Cardiovascular;  Laterality: N/A;  . LEFT HEART CATHETERIZATION WITH CORONARY/GRAFT ANGIOGRAM  N/A 12/02/2013   Procedure: LEFT HEART CATHETERIZATION WITH [name];  Surgeon: [name], MD;  Location: [place] CATH LAB;  Service: Cardiovascular;  Laterality: N/A;  . PERCUTANEOUS CORONARY STENT INTERVENTION (PCI-S) N/A 12/03/2013   Procedure: PERCUTANEOUS CORONARY STENT INTERVENTION (PCI-S);  Surgeon: [name], MD;  Location: [facility] CATH LAB;  Service: Cardiovascular;  Laterality: N/A;   FH  Family History  Problem Relation Age of Onset  . Lung cancer Mother   . Anemia Mother   . Polycystic kidney disease Mother   . Diabetes Father   . Heart attack Father 22       Died suddenly  . Heart failure Father   . Hyperlipidemia Father   . Hypertension Father   . Kidney failure Sister 5       Died  . Heart attack Sister   . Hypertension Sister   . Diabetes Sister   . Diabetes Brother   . Polycystic kidney disease Brother   . Diabetes Sister    SH  reports that he has quit smoking. His smoking use included cigarettes. He has never used smokeless tobacco. He reports that he does not drink alcohol or use drugs. Allergies  Allergies  Allergen Reactions  . Cleocin [Clindamycin Hcl] Anaphylaxis and Swelling  . Glimepiride [Amaryl] Other (See Comments)    Elevates liver function   . Clarithromycin Itching and Other (See Comments)    "Biaxin" Eyes itch and burn  . Colchicine Other (See Comments)    Affected kidneys   . Ciprocin-Fluocin-Procin [Fluocinolone Acetonide] Rash   Home medications Prior to Admission medications   Medication Sig Start Date End Date Taking? Authorizing Provider  acetaminophen (TYLENOL) 500 MG tablet Take 500 mg by mouth 3 (three) times daily as needed (for pain or headaches).    Yes [provider]  allopurinol (ZYLOPRIM) 100 MG tablet Take 100 mg by mouth daily.    Yes [provider]  amLODipine (NORVASC) 10 MG tablet Take 1 tablet (10 mg total) by mouth daily. 01/12/15  Yes Copland, Gay Filler, MD  aspirin EC 81 MG  EC tablet Take 1 tablet (81 mg total) by mouth daily. 10/19/17  Yes Kilroy, Luke K, PA-C  atorvastatin (LIPITOR) 80 MG tablet Take 1 tablet (80 mg total) by mouth daily. 08/26/17  Yes Dunn, Dayna N, PA-C  carvedilol (COREG) 25 MG tablet Take 2 tablets (50 mg total) by mouth 2 (two) times daily. 06/06/18  Yes End, Harrell Gave, MD  Cholecalciferol 2000 units TABS Take 2,000 Units by mouth daily.    Yes [provider]  clopidogrel (PLAVIX) 75 MG tablet TAKE ONE TABLET BY MOUTH ONCE DAILY WITH BREAKFAST Patient taking differently: Take 75 mg by mouth daily.  12/25/16  Yes End, Harrell Gave, MD  diphenhydrAMINE (BENADRYL) 25 MG tablet Take 25 mg by mouth every 6 (six) hours as needed for allergies or sleep.    Yes [provider]  furosemide (LASIX) 40 MG tablet Take 1 tablet (  40 mg total) by mouth 2 (two) times daily. 12/27/17  Yes End, Harrell Gave, MD  gabapentin (NEURONTIN) 300 MG capsule Take 1 capsule (300 mg total) by mouth 2 (two) times daily. 04/03/17  Yes Eulogio Bear U, DO  hydrALAZINE (APRESOLINE) 50 MG tablet Take 1 tablet (50 mg total) by mouth 3 (three) times daily. 12/20/16  Yes End, Harrell Gave, MD  Insulin NPH Isophane & Regular (NOVOLIN 70/30 [employer]) Inject 20 Units into the skin 2 (two) times daily.    Yes [provider]  isosorbide mononitrate (IMDUR) 120 MG 24 hr tablet Take 1 tablet (120 mg total) by mouth 2 (two) times daily. 08/26/17  Yes Dunn, Dayna N, PA-C  Multiple Vitamin (MULTIVITAMIN WITH MINERALS) TABS tablet Take 1 tablet by mouth daily.   Yes [provider]  nitroGLYCERIN (NITROSTAT) 0.4 MG SL tablet Place 1 tablet (0.4 mg total) under the tongue every 5 (five) minutes as needed for chest pain. 06/06/18  Yes End, Harrell Gave, MD  pantoprazole (PROTONIX) 40 MG tablet Take 40 mg by mouth daily.    Yes [provider]  ranitidine (ZANTAC) 300 MG tablet Take 300 mg by mouth at bedtime.   Yes [provider]  ranolazine (RANEXA) 500  MG 12 hr tablet Take 1 tablet (500 mg total) by mouth 2 (two) times daily. 03/29/17  Yes End, Harrell Gave, MD  Semaglutide [facility]) Inject 2.5 Units into the skin every Saturday.    Yes [provider]  traMADol (ULTRAM) 50 MG tablet Take 1 tablet (50 mg total) by mouth every 6 (six) hours as needed for moderate pain. 05/29/18  Yes Ulyses Amor, PA-C  vitamin E 100 UNIT capsule Take 400 Units by mouth daily.   Yes [provider]  zolpidem (AMBIEN) 10 MG tablet Take 10 mg by mouth at bedtime as needed for sleep.  09/30/17  Yes [provider]  [place]. Devices MISC by Does not apply route. C-PAP    [provider]    Current Medications Scheduled Meds: . allopurinol  100 mg Oral Daily  . aspirin EC  81 mg Oral Daily  . atorvastatin  80 mg Oral Daily  . carvedilol  50 mg Oral BID WC  . cholecalciferol  2,000 Units Oral Daily  . clopidogrel  75 mg Oral Daily  . famotidine  20 mg Oral Daily  . furosemide      . furosemide  60 mg Intravenous Once  . gabapentin  300 mg Oral BID  . insulin aspart  0-9 Units Subcutaneous TID WC  . insulin glargine  10 Units Subcutaneous QHS  . multivitamin with minerals  1 tablet Oral Daily  . pantoprazole  40 mg Oral Daily  . ranolazine  500 mg Oral BID  . vitamin E  400 Units Oral Daily   Continuous Infusions: . sodium chloride 10 mL/hr at 06/20/18 1145  . heparin 950 Units/hr (06/20/18 1013)  . nitroGLYCERIN    . nitroGLYCERIN 10 mcg/min (06/20/18 0843)   PRN Meds:.acetaminophen, diphenhydrAMINE, guaiFENesin-dextromethorphan, morphine injection, nitroGLYCERIN, traMADol, zolpidem  CBC Recent Labs  Lab 06/17/18 1934 06/19/18 1647 06/20/18 0449  WBC 10.7* 11.9* 11.7*  HGB 10.8* 11.0* 10.8*  HCT 34.8* 35.8* 33.8*  MCV 92.6 93.5 91.6  PLT 251 262 884   Basic Metabolic Panel Recent Labs  Lab 06/17/18 1934 06/19/18 1647 06/20/18 0449  NA 136 137 140  K 4.1 4.7 4.6  CL 104 102 108  CO2 23 23 20*   GLUCOSE 186*  176* 146*  BUN 45* 51* 51*  CREATININE 3.89* 4.04* 4.05*  CALCIUM 9.1 9.3 9.0    Physical Exam  Blood pressure 126/76, pulse 92, temperature 97.9 F (36.6 C), temperature source Oral, resp. rate (!) 28, height 5\' 5"  (1.651 m), weight 106.3 kg, SpO2 94 %. GEN: No acute distress, sitting in chair, obese ENT: NCAT EYES: EOMI CV: Regular, normal S1 and S2.  No rub PULM: Speaks in full sentences.  Using supplemental oxygen.  Clear throughout. ABD: Soft, nontender, obese SKIN: Left upper extremity is healing well where the surgical scar is from AV fistula, bruit and thrill are present EXT: Trace lower extremity edema   Assessment 67M CKD4 from [facility] with [place], hx/o CAD and CABG  1. CKD4, at baseline; s/p 1st stage BVR 05/2018 VVS 2. [place], hx/o CAD and CABG; rec for LHC by Cardiology; on BB, DAPT, Hep gtt 3. HTN, stable 4. Hx/o CHF 5. Obesity 6. SOB/Dyspnea; hypervolemia  Plan 1. Discussed a significant and high probability but not certain risk of dialysis dependence following cardiac catheterization 2. If he wishes to move forward with transplant listing his cardiac status will need to be evaluated, I think this moves him forward on proceeding with cardiac catheterization 3. He is willing to move forward with cardiac catheterization at the current time 4. Agree with stopping IV fluids and reinitiating diuretics at the current time 5. Prior to cardiac catheterization on Monday he will need hydration pre-and post procedure 6. We will follow along 7. Daily weights, Daily Renal Panel, Strict I/Os, Avoid nephrotoxins (NSAIDs, judicious IV Contrast)    Pearson Grippe MD [phone] pgr 06/20/2018, 1:47 PM

## 2018-06-20 NOTE — Progress Notes (Signed)
Came to see patient again around 2 PM. Patient was complaining of SOB, chest x ray with asymmetric edema. IV lasix 60 mg ordered. Called by nurse around 5;50 PM, that patient was weak, tired, worn out. Ordered was give for blood gas, CBG, EKG. Cardiology was also contacted and came to see patient. Ordered dilaudid, IV lasix.  I came to see patient also he was on NB mask, feels weak, SOB.  CVS; S 1, S 2 RRR Lungs ; Bilateral crackles.  Abdomen distended.   Plan;  Acute hypoxic respiratory failure;  Repeat chest x ray.  Check pro-calcitonin, lactic acid.  Schedule IV lasix 60 mg IV BID.  CCM consulted, will need to be move to ICU.  On IV Unasyn.   Unstable angina;  Report chest pain, pressure. Evaluated by cardiology.  Will cycle cardiac enzymes.  On nitroglycerin gtt.   Niel Hummer, MD.

## 2018-06-20 NOTE — Progress Notes (Signed)
Progress Note  Patient Name: Matthew SKOWRON Sr. Date of Encounter: 06/20/2018  Primary Cardiologist: Nelva Bush, MD transitioning to Dr. Irish Lack  Subjective   Having intermittent chest pain this AM. Starting nitro drip. Discussed that we could do the heart cath today, as we discussed yesterday. He prefers to wait until Monday so that he can talk with his nephrologist.  Inpatient Medications    Scheduled Meds: . allopurinol  100 mg Oral Daily  . aspirin EC  81 mg Oral Daily  . atorvastatin  80 mg Oral Daily  . carvedilol  50 mg Oral BID WC  . cholecalciferol  2,000 Units Oral Daily  . clopidogrel  75 mg Oral Daily  . famotidine  20 mg Oral Daily  . gabapentin  300 mg Oral BID  . insulin aspart  0-9 Units Subcutaneous TID WC  . insulin glargine  10 Units Subcutaneous QHS  . multivitamin with minerals  1 tablet Oral Daily  . pantoprazole  40 mg Oral Daily  . ranolazine  500 mg Oral BID  . vitamin E  400 Units Oral Daily   Continuous Infusions: . sodium chloride 150 mL/hr at 06/20/18 0339  . heparin 950 Units/hr (06/20/18 1013)  . nitroGLYCERIN    . nitroGLYCERIN 10 mcg/min (06/20/18 0843)   PRN Meds: acetaminophen, diphenhydrAMINE, guaiFENesin-dextromethorphan, morphine injection, nitroGLYCERIN, traMADol, zolpidem   Vital Signs    Vitals:   06/20/18 0533 06/20/18 0831 06/20/18 0900 06/20/18 1000  BP: (!) 141/83 128/83 128/75 126/76  Pulse: 92     Resp:  (!) 29 (!) 26 (!) 28  Temp: 97.9 F (36.6 C)     TempSrc: Oral     SpO2: 94%     Weight:      Height:        Intake/Output Summary (Last 24 hours) at 06/20/2018 1116 Last data filed at 06/20/2018 0927 Gross per 24 hour  Intake 360 ml  Output 1000 ml  Net -640 ml   Filed Weights   06/19/18 1656 06/19/18 2102 06/20/18 0533  Weight: 103 kg 105.6 kg 106.3 kg    Telemetry    Normal sinus rhythm - Personally Reviewed  ECG    NSR - Personally Reviewed  Physical Exam   GEN: No acute distress.    Neck: No JVD appreciated Cardiac: RRR, no murmurs, rubs, or gallops.  Respiratory: Clear to auscultation bilaterally except for mild diminishment at bases GI: Soft, nontender, non-distended  MS: No edema; No deformity. Fistula not yet mature Neuro:  Nonfocal  Psych: Normal affect   Labs    Chemistry Recent Labs  Lab 06/17/18 1934 06/19/18 1647 06/20/18 0449  NA 136 137 140  K 4.1 4.7 4.6  CL 104 102 108  CO2 23 23 20*  GLUCOSE 186* 176* 146*  BUN 45* 51* 51*  CREATININE 3.89* 4.04* 4.05*  CALCIUM 9.1 9.3 9.0  GFRNONAA 16* 15* 15*  GFRAA 18* 17* 17*  ANIONGAP 9 12 12      Hematology Recent Labs  Lab 06/17/18 1934 06/19/18 1647 06/20/18 0449  WBC 10.7* 11.9* 11.7*  RBC 3.76* 3.83* 3.69*  HGB 10.8* 11.0* 10.8*  HCT 34.8* 35.8* 33.8*  MCV 92.6 93.5 91.6  MCH 28.7 28.7 29.3  MCHC 31.0 30.7 32.0  RDW 14.3 14.2 14.2  PLT 251 262 252    Cardiac Enzymes Recent Labs  Lab 06/18/18 0332 06/18/18 0856 06/19/18 2249 06/20/18 0449  TROPONINI <0.03 <0.03 0.12* 0.15*    Recent Labs  Lab  06/17/18 1949 06/19/18 1651  TROPIPOC 0.00 0.01     BNP Recent Labs  Lab 06/17/18 1934  BNP 181.8*     DDimer No results for input(s): DDIMER in the last 168 hours.   Radiology    Dg Chest 2 View  Result Date: 06/19/2018 CLINICAL DATA:  Chest pain EXAM: CHEST - 2 VIEW COMPARISON:  06/17/2018 FINDINGS: Prior median sternotomy. Heart is normal size. No confluent airspace opacities or effusions. IMPRESSION: No active cardiopulmonary disease. Electronically Signed   By: Rolm Baptise M.D.   On: 06/19/2018 17:38    Cardiac Studies   Per Dr. Darnelle Bos notes Cath/PCI:  LHC (12/02/13): LMCA with 30% distal stenosis. LAD with multiple stents and 50% ostial stenosis followed by 20% diffuse in-stent restenosis proximally. There is diffuse 50% in-stent restenosis in the mid and distal segments. The distal LAD is occluded. LCx is small with 40-50% ostial stenosis. OM 2 has a 50%  ostial stenosis. Ramus intermedius is a large vessel with 70-80% stenosis involving its superior branch. 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 There is diffuse 20% proximal disease. There is a 90% stenosis involving the distal vessel just beyond the anastomosis with the RIMA. 60% stenosis extending into the RPDA is also evident. RIMA to RCA is widely patent with aforementioned stenosis just beyond the distal anastomosis.  PCI (12/03/13): Successful PCI to the mid/distal RCA with overlapping Promus drug-eluting stents (2.25 x 24 and 2.25 x 16 mm).  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  Patient Profile     57 y.o. male with history of known CAD, CKD stage 5 progressing towards renal transplant with fistula in place, hypertension, chronic angina who presents after having chest pain during a stress test, with report of stress test being significant abnormal.  Assessment & Plan    Unstable angina: as noted previously, with chest pain during stress, intermittent continued chest pain, and reported (per patient) abnormal stress, next step is a cardiac catheterization.  I've contacted both Dr. Saunders Revel and Dr. Irish Lack, who assist with his care. The patient is aware that a cardiac catheterization would likely precipitate the need for dialysis.  I discussed at length with him that given his accelerating symptoms, I would prefer to do the heart cath today. He would instead like to wait until Monday, 10/14.   We will medically manage him as unstable angina on heparin, nitroglycerin drip, in addition to his cardiac medications. Will stop fluids given no plans for cath today.   This is a difficult situation, with significant risk for both doing cath and waiting. He understands that we do not do routine cath over the weekend, though if he were having accelerating symptoms not amenable to medical treatment we would have to consider urgent cath.  We will await nephrology recommendations and tentatively plan for cath on 10/14.  For questions or updates, please contact [place] Please consult [email or web address] for contact info under    Signed, Buford Dresser, MD  06/20/2018, 11:16 AM

## 2018-06-20 NOTE — Consult Note (Addendum)
NAME:  Matthew Holt., MRN:  [id number], DOB:  04/21/1961, LOS: 1 ADMISSION DATE:  06/19/2018, CONSULTATION DATE:  06/20/2018 REFERRING MD:  Dr. Tyrell Antonio, CHIEF COMPLAINT:  Hypoxia/Chest Pain    Brief History   57 year old male presents with chest pain on 10/10 after pharmacological stress test.   Cardiology consulted with plans for a Cath however patient wanted to wait to speak to Nephrologist before proceeding. Nephrologist consulted and spoke to patient, plans for Cath on Monday 10/14. During this time lasix was held and patient was hydrate in preparation. However, in the evening on 10/11 patient developed increasing chest pain and progressive hypoxia. ABG with PO2 49.2. CXR with bilateral pulmonary opacities (right greater than left). Lasix given, PCCM and Cardiology consulted.   Cardiology with plans to maintain Nitroglycerin gtt and continue with plans for Cath on Monday.   Past Medical History  CAD s/p CABG, Chronic Angina, Chronic Diastolic Heart Failure, DM, HTN, CKD stage IV  Significant Hospital Events   10/10 > Presents to ED   Consults: date of consult/date signed off & final recs:  Cardiology 10/10 Nephrology 10/11 PCCM 10/11   Procedures (surgical and bedside):  N/A   Significant Diagnostic Tests:  CXR 10/10 > No active  CXR 10/11 > Cardiomegaly is redemonstrated. BILATERAL pulmonary opacities, worse on the RIGHT are redemonstrated, and slightly worse. No effusion or pneumothorax  Micro Data:  Blood 10/11 >> U/A 10/11 >>  Antimicrobials:  Unasyn 10/11 >>   Subjective:  Chest Pain 2/10  States improved breathing   Objective   Blood pressure (!) 149/91, pulse (!) 103, temperature 98.5 F (36.9 C), temperature source Oral, resp. rate (!) 31, height 5\' 5"  (1.651 m), weight 106.3 kg, SpO2 93 %.        Intake/Output Summary (Last 24 hours) at 06/20/2018 2042 Last data filed at 06/20/2018 2000 Gross per 24 hour  Intake 2696.83 ml  Output 1900 ml    Net 796.83 ml   Filed Weights   06/19/18 1656 06/19/18 2102 06/20/18 0533  Weight: 103 kg 105.6 kg 106.3 kg    Examination: General: Adult male, no distress  HENT: Dry MM  Lungs: Crackles throughout, no wheeze   Cardiovascular: RRR, no MRG Abdomen: Obese, soft  Extremities: +1 BLE  Neuro: Alert, oriented, follows commands  GU: intact   Resolved Hospital Problem list    Assessment & Plan:   Acute Hypoxic Respiratory Distress in setting of cardiogenic pulmonary edema  H/O OSA on CPAP  -Sputum pink, frothy Plan  -Maintain Oxygen Saturation >92 -Trend CXR/ABG -Place/Maintain BiPAP   Unstable Angina Diastolic HF  H/O CAD s/p CABG, HTN, HLD  Plan -Cardiac Monitoring -Per Cardiology  -Plans for Cath Monday 10/14  -Trend Troponin (Currently 0.22)  -Titrate/Wean Nitro for chest pain goals  -Maintain Heparin gtt   CKD stage IV -Baseline Crt 4-4.5 Plan  -Nephrology Following -Lasix 60 BID   Leukocytosis, Likely Reactive given clinical picture Afebrile  Plan -Trend WBC and Fever -Follow Culture Data  -Currently on Unasyn (Covering for possible CAP)   Disposition / Summary of Today's Plan 06/20/18   Will maintain in Step-Down at this time. Please call back if change in patient condition.     Diet: NPO DVT prophylaxis: Heparin gtt  Mobility: Bedrest  Code Status: Full Code  Family Communication: no family present   Labs   CBC: Recent Labs  Lab 06/17/18 1934 06/19/18 1647 06/20/18 0449  WBC 10.7* 11.9* 11.7*  HGB 10.8*  11.0* 10.8*  HCT 34.8* 35.8* 33.8*  MCV 92.6 93.5 91.6  PLT 251 262 440    Basic Metabolic Panel: Recent Labs  Lab 06/17/18 1934 06/19/18 1647 06/20/18 0449  NA 136 137 140  K 4.1 4.7 4.6  CL 104 102 108  CO2 23 23 20*  GLUCOSE 186* 176* 146*  BUN 45* 51* 51*  CREATININE 3.89* 4.04* 4.05*  CALCIUM 9.1 9.3 9.0   GFR: Estimated Creatinine Clearance: 22.3 mL/min (A) (by C-G formula based on SCr of 4.05 mg/dL (H)). Recent  Labs  Lab 06/17/18 1934 06/19/18 1647 06/20/18 0449  WBC 10.7* 11.9* 11.7*    Liver Function Tests: No results for input(s): AST, ALT, ALKPHOS, BILITOT, PROT, ALBUMIN in the last 168 hours. No results for input(s): LIPASE, AMYLASE in the last 168 hours. No results for input(s): AMMONIA in the last 168 hours.  ABG    Component Value Date/Time   PHART 7.406 06/20/2018 1800   PCO2ART 35.0 06/20/2018 1800   PO2ART 49.2 (L) 06/20/2018 1800   HCO3 21.6 06/20/2018 1800   TCO2 21 12/01/2010 0356   ACIDBASEDEF 2.4 (H) 06/20/2018 1800   O2SAT 83.7 06/20/2018 1800     Coagulation Profile: Recent Labs  Lab 06/17/18 1934  INR 0.96    Cardiac Enzymes: Recent Labs  Lab 06/18/18 0332 06/18/18 0856 06/19/18 2249 06/20/18 0449 06/20/18 1036  TROPONINI <0.03 <0.03 0.12* 0.15* 0.19*    HbA1C: Hgb A1c MFr Bld  Date/Time Value Ref Range Status  05/29/2018 08:11 AM 7.0 (H) 4.8 - 5.6 % Final    Comment:    (NOTE) Pre diabetes:          5.7%-6.4% Diabetes:              >6.4% Glycemic control for   <7.0% adults with diabetes   04/04/2016 08:06 AM 6.9 (H) 4.8 - 5.6 % Final    Comment:    (NOTE)         Pre-diabetes: 5.7 - 6.4         Diabetes: >6.4         Glycemic control for adults with diabetes: <7.0     CBG: Recent Labs  Lab 06/18/18 1147 06/19/18 2102 06/20/18 0747 06/20/18 1117 06/20/18 1658  GLUCAP 154* 197* 104* 221* 119*    Admitting History of Present Illness.   As above   Review of Systems:   All negative; except for those that are bolded, which indicate positives.  Constitutional: weight loss, weight gain, night sweats, fevers, chills, fatigue, weakness.  HEENT: headaches, sore throat, sneezing, nasal congestion, post nasal drip, difficulty swallowing, tooth/dental problems, visual complaints, visual changes, ear aches. Neuro: difficulty with speech, weakness, numbness, ataxia. CV:  chest pain, orthopnea, PND, swelling in lower extremities,  dizziness, palpitations, syncope.  Resp: cough, hemoptysis, dyspnea, wheezing. GI: heartburn, indigestion, abdominal pain, nausea, vomiting, diarrhea, constipation, change in bowel habits, loss of appetite, hematemesis, melena, hematochezia.  GU: dysuria, change in color of urine, urgency or frequency, flank pain, hematuria. MSK: joint pain or swelling, decreased range of motion. Psych: change in mood or affect, depression, anxiety, suicidal ideations, homicidal ideations. Skin: rash, itching, bruising.   Past Medical History  He,  has a past medical history of Acute on chronic combined systolic and diastolic CHF (congestive heart failure) ([place]) (10/15/2017), Chronic anemia, Chronic combined systolic and diastolic CHF (congestive heart failure) (HCC), Chronic stable angina ([place]), CKD (chronic kidney disease), stage IV ([place]), Coronary artery disease, DM2 (diabetes mellitus, type  2) ([place]), Dyslipidemia, GERD (gastroesophageal reflux disease), History of gout, Hypertension, Hypertensive heart disease, Ischemic cardiomyopathy, Mild aortic stenosis, Myocardial infarction ([place]) (1999; 2002; 2003; 2006; ~ 2008), OSA on CPAP, and Polycystic kidney disease.   Surgical History    Past Surgical History:  Procedure Laterality Date  . AV FISTULA PLACEMENT Left 05/29/2018   Procedure: ARTERIOVENOUS (AV) FISTULA CREATION  LEFT ARM.;  Surgeon: Serafina Mitchell, MD;  Location: [facility];  Service: Vascular;  Laterality: Left;  . CARDIAC CATHETERIZATION  2012  . CARDIAC CATHETERIZATION  2013  . CARDIAC CATHETERIZATION  2014   LHC (1/14): total occlusion of distal LAD, diffuse disease in ramus, 70% proximal LCx, 50% proximal and mid RCA, 50% distal RCA, patent RIMA-RCA.  Marland Kitchen CORONARY ANGIOPLASTY WITH STENT PLACEMENT  1999 / 2002 / 2004 / 2006?  . CORONARY ARTERY BYPASS GRAFT  2006   CABG X?; in [place]  . CYSTECTOMY  1990's   off face  . HERNIA REPAIR     inguinal hernia  . LEFT HEART CATHETERIZATION WITH  CORONARY/GRAFT ANGIOGRAM N/A 09/16/2012   Procedure: LEFT HEART CATHETERIZATION WITH [name];  Surgeon: [name], MD;  Location: [facility] CATH LAB;  Service: Cardiovascular;  Laterality: N/A;  . LEFT HEART CATHETERIZATION WITH CORONARY/GRAFT ANGIOGRAM N/A 12/02/2013   Procedure: LEFT HEART CATHETERIZATION WITH [name];  Surgeon: [name], MD;  Location: [facility] CATH LAB;  Service: Cardiovascular;  Laterality: N/A;  . PERCUTANEOUS CORONARY STENT INTERVENTION (PCI-S) N/A 12/03/2013   Procedure: PERCUTANEOUS CORONARY STENT INTERVENTION (PCI-S);  Surgeon: [name], MD;  Location: [facility] CATH LAB;  Service: Cardiovascular;  Laterality: N/A;     Social History   Social History   Socioeconomic History  . Marital status: Married    Spouse name: [name]  . Number of children: 5  . Years of education: Not on file  . Highest education level: Not on file  Occupational History  . Occupation: retired    Comment: Chartered loss adjuster  Social Needs  . Financial resource strain: Not on file  . Food insecurity:    Worry: Not on file    Inability: Not on file  . Transportation needs:    Medical: Not on file    Non-medical: Not on file  Tobacco Use  . Smoking status: Former Smoker    Types: Cigarettes  . Smokeless tobacco: Never Used  . Tobacco comment: smoked some as a teenager (high school) not even a pack a week  Substance and Sexual Activity  . Alcohol use: No    Alcohol/week: 0.0 standard drinks    Comment: 09/15/2012 "drank a little when I was young"  . Drug use: No  . Sexual activity: Yes  Lifestyle  . Physical activity:    Days per week: Not on file    Minutes per session: Not on file  . Stress: Not on file  Relationships  . Social connections:    Talks on phone: Not on file    Gets together: Not on file    Attends religious service: Not on file    Active member of club or organization: Not on file    Attends meetings of clubs or organizations:  Not on file    Relationship status: Not on file  . Intimate partner violence:    Fear of current or ex partner: Not on file    Emotionally abused: Not on file    Physically abused: Not on file    Forced sexual activity: Not on file  Other Topics Concern  . Not on file  Social History Narrative   Married and lives with wife in [place]. On disability.    Consumes about 2 Coke Zeros a day   ,  reports that he has quit smoking. His smoking use included cigarettes. He has never used smokeless tobacco. He reports that he does not drink alcohol or use drugs.   Family History   His family history includes Anemia in his mother; Diabetes in his brother, father, sister, and sister; Heart attack in his sister; Heart attack (age of onset: 71) in his father; Heart failure in his father; Hyperlipidemia in his father; Hypertension in his father and sister; Kidney failure (age of onset: 60) in his sister; Lung cancer in his mother; Polycystic kidney disease in his brother and mother.   Allergies Allergies  Allergen Reactions  . Cleocin [Clindamycin Hcl] Anaphylaxis and Swelling  . Glimepiride [Amaryl] Other (See Comments)    Elevates liver function   . Clarithromycin Itching and Other (See Comments)    "Biaxin" Eyes itch and burn  . Colchicine Other (See Comments)    Affected kidneys   . Ciprocin-Fluocin-Procin [Fluocinolone Acetonide] Rash     Home Medications  Prior to Admission medications   Medication Sig Start Date End Date Taking? Authorizing Provider  acetaminophen (TYLENOL) 500 MG tablet Take 500 mg by mouth 3 (three) times daily as needed (for pain or headaches).    Yes [provider]  allopurinol (ZYLOPRIM) 100 MG tablet Take 100 mg by mouth daily.    Yes [provider]  amLODipine (NORVASC) 10 MG tablet Take 1 tablet (10 mg total) by mouth daily. 01/12/15  Yes Copland, Gay Filler, MD  aspirin EC 81 MG EC tablet Take 1 tablet (81 mg total) by mouth daily. 10/19/17  Yes  Kilroy, Luke K, PA-C  atorvastatin (LIPITOR) 80 MG tablet Take 1 tablet (80 mg total) by mouth daily. 08/26/17  Yes Dunn, Dayna N, PA-C  carvedilol (COREG) 25 MG tablet Take 2 tablets (50 mg total) by mouth 2 (two) times daily. 06/06/18  Yes End, Harrell Gave, MD  Cholecalciferol 2000 units TABS Take 2,000 Units by mouth daily.    Yes [provider]  clopidogrel (PLAVIX) 75 MG tablet TAKE ONE TABLET BY MOUTH ONCE DAILY WITH BREAKFAST Patient taking differently: Take 75 mg by mouth daily.  12/25/16  Yes End, Harrell Gave, MD  diphenhydrAMINE (BENADRYL) 25 MG tablet Take 25 mg by mouth every 6 (six) hours as needed for allergies or sleep.    Yes [provider]  furosemide (LASIX) 40 MG tablet Take 1 tablet (40 mg total) by mouth 2 (two) times daily. 12/27/17  Yes End, Harrell Gave, MD  gabapentin (NEURONTIN) 300 MG capsule Take 1 capsule (300 mg total) by mouth 2 (two) times daily. 04/03/17  Yes Eulogio Bear U, DO  hydrALAZINE (APRESOLINE) 50 MG tablet Take 1 tablet (50 mg total) by mouth 3 (three) times daily. 12/20/16  Yes End, Harrell Gave, MD  Insulin NPH Isophane & Regular (NOVOLIN 70/30 [place]) Inject 20 Units into the skin 2 (two) times daily.    Yes [provider]  isosorbide mononitrate (IMDUR) 120 MG 24 hr tablet Take 1 tablet (120 mg total) by mouth 2 (two) times daily. 08/26/17  Yes Dunn, Dayna N, PA-C  Multiple Vitamin (MULTIVITAMIN WITH MINERALS) TABS tablet Take 1 tablet by mouth daily.   Yes [provider]  nitroGLYCERIN (NITROSTAT) 0.4 MG SL tablet Place 1 tablet (0.4 mg total) under  the tongue every 5 (five) minutes as needed for chest pain. 06/06/18  Yes End, Harrell Gave, MD  pantoprazole (PROTONIX) 40 MG tablet Take 40 mg by mouth daily.    Yes [provider]  ranitidine (ZANTAC) 300 MG tablet Take 300 mg by mouth at bedtime.   Yes [provider]  ranolazine (RANEXA) 500 MG 12 hr tablet Take 1 tablet (500 mg total) by mouth 2 (two)  times daily. 03/29/17  Yes End, Harrell Gave, MD  Semaglutide [facility] [facility]) Inject 2.5 Units into the skin every Saturday.    Yes [provider]  traMADol (ULTRAM) 50 MG tablet Take 1 tablet (50 mg total) by mouth every 6 (six) hours as needed for moderate pain. 05/29/18  Yes Ulyses Amor, PA-C  vitamin E 100 UNIT capsule Take 400 Units by mouth daily.   Yes [provider]  zolpidem (AMBIEN) 10 MG tablet Take 10 mg by mouth at bedtime as needed for sleep.  09/30/17  Yes [provider]  [place]. Devices MISC by Does not apply route. C-PAP    [provider]     Critical care time: 48 minutes     Hayden Pedro, AGACNP-BC [facility]  PCCM Pgr: [phone]

## 2018-06-20 NOTE — Care Management Note (Signed)
Case Management Note  Patient Details  Name: FIRAS GUARDADO Sr. MRN: [id number] Date of Birth: [birth year]  Subjective/Objective:  Pt presented from the [facility]- for unstable angina. Continues on IV Heparin/Nitro gtt. PTA independent from home with wife. Pt has PCP [facility] @ [place]. CSW is Glean Hess [phone] ext [phone]. Pager [phone] for disposition needs.                   Action/Plan: Patient will need d/c summary faxed to [phone]. CM will continue to monitor for additional needs.    Expected Discharge Date:                  Expected Discharge Plan:  Home/Self Care  In-House Referral:  NA  Discharge planning Services  CM Consult  Post Acute Care Choice:    Choice offered to:     DME Arranged:    DME Agency:     HH Arranged:    HH Agency:     Status of Service:  In process, will continue to follow  If discussed at Long Length of Stay Meetings, dates discussed:    Additional Comments:  Bethena Roys, RN 06/20/2018, 10:32 AM

## 2018-06-20 NOTE — Significant Event (Signed)
Rapid Response Event Note  Overview: Time Called: 1537 Arrival Time: 1756 Event Type: Respiratory, Cardiac  Initial Focused Assessment: Patient with increased fatigue this afternoon.  O2 sats 88-91% on [place].  Patient sat up on the side of the bed because of sudden increase of CP to 10/10 on NTG gtt at 78mcg and hep gtt.  BP 123/82  ST 106  RR 24  O2 sat 86% on 3L [place] Lung sounds: rales on the right  Interventions: 12 lead EKG done ABG done on [place]  7.4/35/49/21 Patient complaint of 10 out of 10 CP NTG increased to 20 mcg  Placed patient on 100% NRB Upon reassessment CP 8/10  NTG increased to 57mcg CP improved to 4/10   Cardiology and Dr Tyrell Antonio at bedside to assess patient PCXR done 60mg  Lasix given IV 0.5mg  Dilaudid given IV Patient improved to CP 2/10 Voided 325cc   [place] NP (CCM) at bedside to assess patient. Placed patient on 3L [place] per request Hand off with Waunita Schooner and [facility] of Care (if not transferred):  Event Summary: Name of Physician Notified: Regalado at    Name of Consulting Physician Notified: Cardiology   NIna and Dr Harrell Gave at          Raliegh Ip

## 2018-06-20 NOTE — Progress Notes (Signed)
CRITICAL VALUE ALERT  Critical Value:  Troponin 0.12  Date & Time Notied:  06/19/18 0004  Provider Notified: Dr. Marlowe Sax  Orders Received/Actions taken: No new orders

## 2018-06-20 NOTE — Progress Notes (Signed)
Patient is having chest pain. EKG done. Nitro given times . Pain level 5. Provider on call notified. Patient said he feels better.

## 2018-06-20 NOTE — Progress Notes (Signed)
Pt has started coughing in the past 30 minutes. On his right side of his lungs he has fine crackles. NS was going in at 150cc/hr for presumed hydration for cath today. Pt has seen cardiologist and plan for cath on Monday. I spoke with Dr. Harrell Gave and she stated for fluid to be at [facility]. I text paged Dr. Tyrell Antonio for prn cough medicine. Will closly monitor. Pt and wife updated at bedside

## 2018-06-20 NOTE — Progress Notes (Addendum)
PROGRESS NOTE    Matthew Rowe  UMP:[id number] DOB: Aug 31, 1961 DOA: 06/19/2018 PCP: [name], MD    Brief Narrative: Matthew Rowe. is a 57 y.o. male with medical history significant of coronary artery disease status post CABG and multiple PCI's, chronic angina, chronic diastolic congestive heart failure, type 2 diabetes, hypertension, CKD stage IV presenting to the hospital for further evaluation of chest pain. Patient went to [facility] for a pharmacological stress test today and developed severe chest pain after administration of regadenoson that did not improve with aminophylline or caffeine.  He was sent to the ED for further evaluation.  Patient reports having substernal, 10 out of 10 intensity, pressure-like chest pain and associated shortness of breath during the stress test.  States the doctor there told him that she noticed a blockage in his heart.  States he is currently chest pain-free.  ED Course: Vitals stable on arrival.  White count 11.9.  I-STAT troponin negative.  Chest x-ray showing no active cardiopulmonary disease.  EKG showing sinus tachycardia and left bundle branch block.  Patient was seen by cardiology.  TRH paged to admit.  Assessment & Plan:   Principal Problem:   Unstable angina (HCC) Active Problems:   Hypertension   Type 2 diabetes with nephropathy (HCC)   Gout   HLD (hyperlipidemia)   CKD (chronic kidney disease) stage 4, GFR 15-29 ml/min (HCC)   Chronic anemia   Chronic diastolic heart failure (HCC)   Leukocytosis   Chronic pain   Insomnia   1-Unable Angina; Develops chest pain during stress test.  Intermittent episodes of chest pain during this admission.  Currently with chest pain. Will repeat EKG, nitroglycerin and morphine PRN/  Mild elevation of troponin.  plan to start nitroglycerin gtt.   2-CKD stage IV;  On IV fluid for possible cath today.  Nephrology consulted.   3-DM type 2;  Change lantus to 10 units HS.    SSI.  4-Mild leukocytosis;  Follow trend.   Hypertension;  Continue with carvedilol.  Hold Norvasc to avoid hypotension.  Hold Imdur, patient will be started on nitroglycerin gtt. .   Chronic diastolic Heart failure;  Compensated.   HLD; continue with statins.   Gout;  Continue with allopurinol.   Chronic pain/neuropathy -Continue gabapentin  -Continue home tramadol  Insomnia -Continue home Ambienprn      DVT prophylaxis: Heparin Gtt Code Status: Full code.  Family Communication: care discussed with wife who was at bedside.  Disposition Plan: remain in the hospital for treatment of angina  Consultants:  Cardiology Nephrology    Procedures:   Cath;    Antimicrobials:   none   Subjective: He report chest pain, 3/10.  Wants to eat.    Objective: Vitals:   06/20/18 0314 06/20/18 0317 06/20/18 0533 06/20/18 0533  BP: 126/76 126/74  (!) 141/83  Pulse:    92  Resp:      Temp:    97.9 F (36.6 C)  TempSrc:    Oral  SpO2: 94% 99%  94%  Weight:   106.3 kg   Height:        Intake/Output Summary (Last 24 hours) at 06/20/2018 0758 Last data filed at 06/20/2018 0456 Gross per 24 hour  Intake 360 ml  Output 700 ml  Net -340 ml   Filed Weights   06/19/18 1656 06/19/18 2102 06/20/18 0533  Weight: 103 kg 105.6 kg 106.3 kg    Examination:  General exam: Appears calm and  comfortable  Respiratory system: Clear to auscultation. Respiratory effort normal. Cardiovascular system: S1 & S2 heard, RRR. No JVD, murmurs, rubs, gallops or clicks. No pedal edema. Gastrointestinal system: Abdomen is nondistended, soft and nontender. No organomegaly or masses felt. Normal bowel sounds heard. Central nervous system: Alert and oriented. No focal neurological deficits. Extremities: Symmetric 5 x 5 power. Skin: No rashes, lesions or ulcers Psychiatry: Judgement and insight appear normal. Mood & affect appropriate.     Data Reviewed: I have personally  reviewed following labs and imaging studies  CBC: Recent Labs  Lab 06/17/18 1934 06/19/18 1647 06/20/18 0449  WBC 10.7* 11.9* 11.7*  HGB 10.8* 11.0* 10.8*  HCT 34.8* 35.8* 33.8*  MCV 92.6 93.5 91.6  PLT 251 262 825   Basic Metabolic Panel: Recent Labs  Lab 06/17/18 1934 06/19/18 1647 06/20/18 0449  NA 136 137 140  K 4.1 4.7 4.6  CL 104 102 108  CO2 23 23 20*  GLUCOSE 186* 176* 146*  BUN 45* 51* 51*  CREATININE 3.89* 4.04* 4.05*  CALCIUM 9.1 9.3 9.0   GFR: Estimated Creatinine Clearance: 22.3 mL/min (A) (by C-G formula based on SCr of 4.05 mg/dL (H)). Liver Function Tests: No results for input(s): AST, ALT, ALKPHOS, BILITOT, PROT, ALBUMIN in the last 168 hours. No results for input(s): LIPASE, AMYLASE in the last 168 hours. No results for input(s): AMMONIA in the last 168 hours. Coagulation Profile: Recent Labs  Lab 06/17/18 1934  INR 0.96   Cardiac Enzymes: Recent Labs  Lab 06/18/18 0332 06/18/18 0856 06/19/18 2249 06/20/18 0449  TROPONINI <0.03 <0.03 0.12* 0.15*   BNP (last 3 results) No results for input(s): PROBNP in the last 8760 hours. HbA1C: No results for input(s): HGBA1C in the last 72 hours. CBG: Recent Labs  Lab 06/18/18 0830 06/18/18 1147 06/19/18 2102 06/20/18 0747  GLUCAP 123* 154* 197* 104*   Lipid Profile: No results for input(s): CHOL, HDL, LDLCALC, TRIG, CHOLHDL, LDLDIRECT in the last 72 hours. Thyroid Function Tests: No results for input(s): TSH, T4TOTAL, FREET4, T3FREE, THYROIDAB in the last 72 hours. Anemia Panel: No results for input(s): VITAMINB12, FOLATE, FERRITIN, TIBC, IRON, RETICCTPCT in the last 72 hours. Sepsis Labs: No results for input(s): PROCALCITON, LATICACIDVEN in the last 168 hours.  No results found for this or any previous visit (from the past 240 hour(s)).       Radiology Studies: Dg Chest 2 View  Result Date: 06/19/2018 CLINICAL DATA:  Chest pain EXAM: CHEST - 2 VIEW COMPARISON:  06/17/2018  FINDINGS: Prior median sternotomy. Heart is normal size. No confluent airspace opacities or effusions. IMPRESSION: No active cardiopulmonary disease. Electronically Signed   By: Rolm Baptise M.D.   On: 06/19/2018 17:38        Scheduled Meds: . allopurinol  100 mg Oral Daily  . amLODipine  10 mg Oral Daily  . aspirin EC  81 mg Oral Daily  . atorvastatin  80 mg Oral Daily  . carvedilol  50 mg Oral BID WC  . cholecalciferol  2,000 Units Oral Daily  . clopidogrel  75 mg Oral Daily  . famotidine  20 mg Oral Daily  . gabapentin  300 mg Oral BID  . insulin aspart  0-9 Units Subcutaneous TID WC  . insulin glargine  10 Units Subcutaneous BID  . isosorbide mononitrate  120 mg Oral BID  . multivitamin with minerals  1 tablet Oral Daily  . pantoprazole  40 mg Oral Daily  . ranolazine  500 mg Oral  BID  . vitamin E  400 Units Oral Daily   Continuous Infusions: . sodium chloride 150 mL/hr at 06/20/18 0339  . heparin 1,100 Units/hr (06/19/18 2304)     LOS: 1 day    Time spent: 35 minutes    Elmarie Shiley, MD [facility] Pager [phone]  If 7PM-7AM, please contact night-coverage [email or web address] Password [email or web address] 06/20/2018, 7:58 AM

## 2018-06-20 NOTE — Progress Notes (Addendum)
ANTICOAGULATION CONSULT NOTE - Follow Up Consult  Pharmacy Consult for Heparin Indication: chest pain/ACS  Allergies  Allergen Reactions  . Cleocin [Clindamycin Hcl] Anaphylaxis and Swelling  . Glimepiride [Amaryl] Other (See Comments)    Elevates liver function   . Clarithromycin Itching and Other (See Comments)    "Biaxin" Eyes itch and burn  . Colchicine Other (See Comments)    Affected kidneys   . Ciprocin-Fluocin-Procin [Fluocinolone Acetonide] Rash    Patient Measurements: Height: 5\' 5"  (165.1 cm) Weight: 234 lb 6.4 oz (106.3 kg) IBW/kg (Calculated) : 61.5 Heparin Dosing Weight: 87 kg  Vital Signs: Temp: 97.9 F (36.6 C) (10/11 0533) Temp Source: Oral (10/11 0533) BP: 126/76 (10/11 1000) Pulse Rate: 92 (10/11 0533)  Labs: Recent Labs    06/17/18 1934  06/18/18 0722 06/18/18 0856 06/19/18 1647 06/19/18 2249 06/20/18 0449 06/20/18 0727  HGB 10.8*  --   --   --  11.0*  --  10.8*  --   HCT 34.8*  --   --   --  35.8*  --  33.8*  --   PLT 251  --   --   --  262  --  252  --   LABPROT 12.6  --   --   --   --   --   --   --   INR 0.96  --   --   --   --   --   --   --   HEPARINUNFRC  --   --  0.69  --   --   --   --  0.92*  CREATININE 3.89*  --   --   --  4.04*  --  4.05*  --   TROPONINI  --    < >  --  <0.03  --  0.12* 0.15*  --    < > = values in this interval not displayed.    Estimated Creatinine Clearance: 22.3 mL/min (A) (by C-G formula based on SCr of 4.05 mg/dL (H)).  Assessment:  57 yr old male on IV heparin for chest pain/ACS.   Chest pain this am, now on IV Nitroglycerin.   CKD IV, hydrating for possible cath today or Monday.    Initial heparin level is supratherapeutic (0.92) on 1100 units/hr.  Goal of Therapy:  Heparin level 0.3-0.7 units/ml Monitor platelets by anticoagulation protocol: Yes   Plan:   Decrease heparin drip to 950 units/hr  Heparin level ~8 hrs after rate change.  Daily heparin level and CBC while on heparin.  Follow up  cath plans.    Arty Baumgartner, [place] Pager: [phone] or phone: [phone] 06/20/2018,10:29 AM  Addendum:   To begin Unasyn for CAP coverage.   Crcl ~20-25 ml/min   Will begin Unasyn 3 gm IV q12hrs.  Consuello Masse, RPh 06/20/2018 2:58 PM

## 2018-06-21 ENCOUNTER — Inpatient Hospital Stay (HOSPITAL_COMMUNITY): Payer: Medicare HMO

## 2018-06-21 DIAGNOSIS — I5030 Unspecified diastolic (congestive) heart failure: Secondary | ICD-10-CM

## 2018-06-21 DIAGNOSIS — J9601 Acute respiratory failure with hypoxia: Secondary | ICD-10-CM

## 2018-06-21 DIAGNOSIS — N184 Chronic kidney disease, stage 4 (severe): Secondary | ICD-10-CM

## 2018-06-21 DIAGNOSIS — I5031 Acute diastolic (congestive) heart failure: Secondary | ICD-10-CM

## 2018-06-21 DIAGNOSIS — J9600 Acute respiratory failure, unspecified whether with hypoxia or hypercapnia: Secondary | ICD-10-CM

## 2018-06-21 LAB — GLUCOSE, CAPILLARY
GLUCOSE-CAPILLARY: 131 mg/dL — AB (ref 70–99)
GLUCOSE-CAPILLARY: 117 mg/dL — AB (ref 70–99)
GLUCOSE-CAPILLARY: 114 mg/dL — AB (ref 70–99)
GLUCOSE-CAPILLARY: 157 mg/dL — AB (ref 70–99)

## 2018-06-21 LAB — ECHOCARDIOGRAM COMPLETE
HEIGHTINCHES: 65 in
WEIGHTICAEL: 3694.4 [oz_av]

## 2018-06-21 LAB — RENAL FUNCTION PANEL
ANION GAP: 11 (ref 5–15)
Albumin: 3.4 g/dL — ABNORMAL LOW (ref 3.5–5.0)
BUN: 51 mg/dL — ABNORMAL HIGH (ref 6–20)
CALCIUM: 9.1 mg/dL (ref 8.9–10.3)
CHLORIDE: 103 mmol/L (ref 98–111)
CO2: 23 mmol/L (ref 22–32)
Creatinine, Ser: 3.79 mg/dL — ABNORMAL HIGH (ref 0.61–1.24)
GFR calc Af Amer: 19 mL/min — ABNORMAL LOW (ref >60)
GFR calc non Af Amer: 16 mL/min — ABNORMAL LOW (ref >60)
Glucose, Bld: 144 mg/dL — ABNORMAL HIGH (ref 70–99)
Phosphorus: 3.3 mg/dL (ref 2.5–4.6)
Potassium: 4.1 mmol/L (ref 3.5–5.1)
SODIUM: 137 mmol/L (ref 135–145)

## 2018-06-21 LAB — CBC
HCT: 35.2 % — ABNORMAL LOW (ref 39.0–52.0)
HEMOGLOBIN: 11.3 g/dL — AB (ref 13.0–17.0)
MCH: 29.5 pg (ref 26.0–34.0)
MCHC: 32.1 g/dL (ref 30.0–36.0)
MCV: 91.9 fL (ref 80.0–100.0)
Platelets: 204 10*3/uL (ref 150–400)
RBC: 3.83 MIL/uL — AB (ref 4.22–5.81)
RDW: 14.3 % (ref 11.5–15.5)
WBC: 14.9 10*3/uL — ABNORMAL HIGH (ref 4.0–10.5)
nRBC: 0.1 % (ref 0.0–0.2)

## 2018-06-21 LAB — PROCALCITONIN: Procalcitonin: 0.28 ng/mL

## 2018-06-21 LAB — ECHOCARDIOGRAM LIMITED
Height: 65 in
Weight: 3694.4 oz

## 2018-06-21 LAB — LACTIC ACID, PLASMA: LACTIC ACID, VENOUS: 1.4 mmol/L (ref 0.5–1.9)

## 2018-06-21 LAB — TROPONIN I
TROPONIN I: 0.24 ng/mL — AB (ref <0.03)
Troponin I: 0.26 ng/mL (ref <0.03)

## 2018-06-21 LAB — HEPARIN LEVEL (UNFRACTIONATED): Heparin Unfractionated: 0.39 IU/mL (ref 0.30–0.70)

## 2018-06-21 LAB — STREP PNEUMONIAE URINARY ANTIGEN: Strep Pneumo Urinary Antigen: NEGATIVE

## 2018-06-21 MED ORDER — DOCUSATE SODIUM 100 MG PO CAPS
100.0000 mg | ORAL_CAPSULE | Freq: Two times a day (BID) | ORAL | Status: DC
  Administered 2018-06-21 – 2018-06-26 (×10): 100 mg via ORAL
  Filled 2018-06-21 (×11): qty 1

## 2018-06-21 MED ORDER — SODIUM CHLORIDE 0.9% FLUSH
3.0000 mL | Freq: Two times a day (BID) | INTRAVENOUS | Status: DC
  Administered 2018-06-21 – 2018-06-22 (×3): 3 mL via INTRAVENOUS

## 2018-06-21 MED ORDER — FUROSEMIDE 10 MG/ML IJ SOLN
60.0000 mg | Freq: Three times a day (TID) | INTRAMUSCULAR | Status: DC
  Administered 2018-06-21 – 2018-06-22 (×3): 60 mg via INTRAVENOUS
  Filled 2018-06-21 (×3): qty 6

## 2018-06-21 MED ORDER — SODIUM CHLORIDE 0.9 % IV SOLN: 250.0000 mL | INTRAVENOUS | Status: DC | PRN

## 2018-06-21 MED ORDER — DOXYCYCLINE HYCLATE 100 MG PO TABS
100.0000 mg | ORAL_TABLET | Freq: Two times a day (BID) | ORAL | Status: DC
  Administered 2018-06-21 – 2018-06-26 (×11): 100 mg via ORAL
  Filled 2018-06-21 (×11): qty 1

## 2018-06-21 MED ORDER — PERFLUTREN LIPID MICROSPHERE
1.0000 mL | INTRAVENOUS | Status: AC | PRN
  Filled 2018-06-21 (×2): qty 10

## 2018-06-21 MED ORDER — PERFLUTREN LIPID MICROSPHERE
1.0000 mL | INTRAVENOUS | Status: AC | PRN
  Administered 2018-06-21: 1 mL via INTRAVENOUS
  Filled 2018-06-21: qty 10

## 2018-06-21 MED ORDER — SODIUM CHLORIDE 0.9 % WEIGHT BASED INFUSION: 1.0000 mL/kg/h | INTRAVENOUS | Status: DC

## 2018-06-21 MED ORDER — ASPIRIN 81 MG PO CHEW: 81.0000 mg | CHEWABLE_TABLET | ORAL | Status: DC

## 2018-06-21 MED ORDER — SODIUM CHLORIDE 0.9% FLUSH: 3.0000 mL | INTRAVENOUS | Status: DC | PRN

## 2018-06-21 NOTE — Progress Notes (Signed)
PROGRESS NOTE    Matthew Rowe  PFX:[id number] DOB: October 03, 1960 DOA: 06/19/2018 PCP: Matthew Limbo, MD    Brief Narrative: Matthew Rowe. is a 57 y.o. male with medical history significant of coronary artery disease status post CABG and multiple PCI's, chronic angina, chronic diastolic congestive heart failure, type 2 diabetes, hypertension, CKD stage IV presenting to the hospital for further evaluation of chest pain. Patient went to [facility] for a pharmacological stress test today and developed severe chest pain after administration of regadenoson that did not improve with aminophylline or caffeine.  He was sent to the ED for further evaluation.  Patient reports having substernal, 10 out of 10 intensity, pressure-like chest pain and associated shortness of breath during the stress test.  States the doctor there told him that she noticed a blockage in his heart.  States he is currently chest pain-free.  ED Course: Vitals stable on arrival.  White count 11.9.  I-STAT troponin negative.  Chest x-ray showing no active cardiopulmonary disease.  EKG showing sinus tachycardia and left bundle branch block.  Patient was seen by cardiology.  TRH paged to admit.  Assessment & Plan:   Principal Problem:   Unstable angina (HCC) Active Problems:   Hypertension   Type 2 diabetes with nephropathy (HCC)   Gout   HLD (hyperlipidemia)   CKD (chronic kidney disease) stage 4, GFR 15-29 ml/min (HCC)   Chronic anemia   Chronic diastolic heart failure (HCC)   Leukocytosis   Chronic pain   Insomnia   Acute respiratory failure with hypoxia (HCC)   Acute diastolic (congestive) heart failure (HCC)   1-Acute Hypoxic Respiratory failure;  In setting of pulmonary edema, post hydration in anticipation for cath. Vs heart failure exacerbation.  Rule out PNA, started on Unasyn. Follow chest x ray.  Appreciate CCM evaluation.  Improved this am, now on 6 L oxygen. Patient is breathing better.    Continue with IV lasix 60 mg IV TID.  Repeated chest x ray for this am; progressive interstitial infiltrates on the right consistent with PNA. Continue with Unasyn and will add doxycycline to cover for atypical.  Follow blood culture.   2-Unable Angina; Develops chest pain during stress test.  Mild elevation of troponin.  On nitroglycerin Gtt.  He is chest pain free this am.  ECHO pending.  Plan for cath on Monday. Nephrology recommending against IV fluids prior to cath due to episode of respiratory failure post fluid resuscitation.   3-CKD stage IV;  Nephrology consulted.  Stable on IV lasix.   4-DM type 2;  Change lantus to 10 units HS.  SSI.  5-Mild leukocytosis;  Follow trend.   6-Hypertension;  Continue with carvedilol.  Hold Norvasc to avoid hypotension.  Hold Imdur, patient will be started on nitroglycerin gtt. Marland Kitchen   7-Acute Chronic diastolic Heart failure;  Post fluid supplement.  On IV lasix.   8-HLD; continue with statins.   9-Gout;  Continue with allopurinol.   10-Chronic pain/neuropathy -Continue gabapentin  -Continue home tramadol  Insomnia -Continue home Ambienprn      DVT prophylaxis: Heparin Gtt Code Status: Full code.  Family Communication: care discussed with wife who was at bedside.  Disposition Plan: remain in the hospital for treatment of angina and respiratory failure.   Consultants:  Cardiology Nephrology    Procedures:   Cath;    Antimicrobials:   none   Subjective: He looks better, more energy. Dyspnea improved.  Denies chest pain.  Objective: Vitals:   06/20/18 2041 06/20/18 2106 06/21/18 0203 06/21/18 0612  BP:  (!) 152/89  (!) 155/83  Pulse: (!) 103 99  99  Resp: (!) 31 (!) 35 (!) 22 (!) 29  Temp:    99.2 F (37.3 C)  TempSrc:    Oral  SpO2: 93% 97% 97% 97%  Weight:    104.7 kg  Height:        Intake/Output Summary (Last 24 hours) at 06/21/2018 1009 Last data filed at 06/21/2018 0530 Gross per 24  hour  Intake 2543.01 ml  Output 2000 ml  Net 543.01 ml   Filed Weights   06/19/18 2102 06/20/18 0533 06/21/18 0612  Weight: 105.6 kg 106.3 kg 104.7 kg    Examination:  General exam: alert, no distress Respiratory system: Bilateral crackles.  Cardiovascular system: S 1, S 2 RRR Gastrointestinal system: BS present, distended, nt Central nervous system: non focal.  Extremities: symmetric power.  Skin: no rash  Psychiatry:  Mood and affect appropriate    Data Reviewed: I have personally reviewed following labs and imaging studies  CBC: Recent Labs  Lab 06/17/18 1934 06/19/18 1647 06/20/18 0449 06/21/18 0212  WBC 10.7* 11.9* 11.7* 14.9*  HGB 10.8* 11.0* 10.8* 11.3*  HCT 34.8* 35.8* 33.8* 35.2*  MCV 92.6 93.5 91.6 91.9  PLT 251 262 252 694   Basic Metabolic Panel: Recent Labs  Lab 06/17/18 1934 06/19/18 1647 06/20/18 0449 06/21/18 0212  NA 136 137 140 137  K 4.1 4.7 4.6 4.1  CL 104 102 108 103  CO2 23 23 20* 23  GLUCOSE 186* 176* 146* 144*  BUN 45* 51* 51* 51*  CREATININE 3.89* 4.04* 4.05* 3.79*  CALCIUM 9.1 9.3 9.0 9.1  PHOS  --   --   --  3.3   GFR: Estimated Creatinine Clearance: 23.7 mL/min (A) (by C-G formula based on SCr of 3.79 mg/dL (H)). Liver Function Tests: Recent Labs  Lab 06/21/18 0212  ALBUMIN 3.4*   No results for input(s): LIPASE, AMYLASE in the last 168 hours. No results for input(s): AMMONIA in the last 168 hours. Coagulation Profile: Recent Labs  Lab 06/17/18 1934  INR 0.96   Cardiac Enzymes: Recent Labs  Lab 06/19/18 2249 06/20/18 0449 06/20/18 1036 06/20/18 1928 06/21/18 0212  TROPONINI 0.12* 0.15* 0.19* 0.22* 0.26*   BNP (last 3 results) No results for input(s): PROBNP in the last 8760 hours. HbA1C: No results for input(s): HGBA1C in the last 72 hours. CBG: Recent Labs  Lab 06/20/18 0747 06/20/18 1117 06/20/18 1658 06/20/18 2233 06/21/18 0730  GLUCAP 104* 221* 119* 153* 131*   Lipid Profile: No results for  input(s): CHOL, HDL, LDLCALC, TRIG, CHOLHDL, LDLDIRECT in the last 72 hours. Thyroid Function Tests: No results for input(s): TSH, T4TOTAL, FREET4, T3FREE, THYROIDAB in the last 72 hours. Anemia Panel: No results for input(s): VITAMINB12, FOLATE, FERRITIN, TIBC, IRON, RETICCTPCT in the last 72 hours. Sepsis Labs: Recent Labs  Lab 06/20/18 1928 06/21/18 0212  PROCALCITON 0.10 0.28  LATICACIDVEN 2.6* 1.4    No results found for this or any previous visit (from the past 240 hour(s)).       Radiology Studies: Dg Chest 2 View  Result Date: 06/19/2018 CLINICAL DATA:  Chest pain EXAM: CHEST - 2 VIEW COMPARISON:  06/17/2018 FINDINGS: Prior median sternotomy. Heart is normal size. No confluent airspace opacities or effusions. IMPRESSION: No active cardiopulmonary disease. Electronically Signed   By: Rolm Baptise M.D.   On: 06/19/2018 17:38  Dg Chest Port 1 View  Result Date: 06/20/2018 CLINICAL DATA:  Chest pain. EXAM: PORTABLE CHEST 1 VIEW COMPARISON:  Portable film earlier in the day. FINDINGS: Cardiomegaly is redemonstrated. BILATERAL pulmonary opacities, worse on the RIGHT are redemonstrated, and slightly worse. No effusion or pneumothorax. IMPRESSION: Worsening aeration. Electronically Signed   By: Staci Righter M.D.   On: 06/20/2018 19:08   Dg Chest Port 1 View  Result Date: 06/20/2018 CLINICAL DATA:  Dyspnea EXAM: PORTABLE CHEST 1 VIEW COMPARISON:  06/19/2018 FINDINGS: Evolving pulmonary space opacities in the right upper, middle and lower lobes. Left lung is relatively clear. Heart size is top-normal. Median sternotomy sutures are place. Nonaneurysmal minimally atherosclerotic aorta. No acute osseous abnormality. IMPRESSION: Evolving patchy airspace opacities in the right lung suspicious for evolving multilobar pneumonia. Unilateral pulmonary edema is also a possibility depending on clinical presentation. Electronically Signed   By: Ashley Royalty M.D.   On: 06/20/2018 14:05         Scheduled Meds: . allopurinol  100 mg Oral Daily  . aspirin EC  81 mg Oral Daily  . atorvastatin  80 mg Oral Daily  . carvedilol  50 mg Oral BID WC  . cholecalciferol  2,000 Units Oral Daily  . clopidogrel  75 mg Oral Daily  . docusate sodium  100 mg Oral BID  . famotidine  20 mg Oral Daily  . furosemide  60 mg Intravenous Q8H  . gabapentin  300 mg Oral BID  . insulin aspart  0-9 Units Subcutaneous TID WC  . insulin glargine  10 Units Subcutaneous QHS  . multivitamin with minerals  1 tablet Oral Daily  . pantoprazole  40 mg Oral Daily  . ranolazine  500 mg Oral BID  . vitamin E  400 Units Oral Daily   Continuous Infusions: . sodium chloride 10 mL/hr at 06/21/18 0530  . ampicillin-sulbactam (UNASYN) IV 3 g (06/21/18 0548)  . heparin 950 Units/hr (06/21/18 0530)  . nitroGLYCERIN 30 mcg/min (06/21/18 0530)     LOS: 2 days    Time spent: 35 minutes    Elmarie Shiley, MD [facility] Pager [phone]  If 7PM-7AM, please contact night-coverage [email or web address] Password [email or web address] 06/21/2018, 10:09 AM

## 2018-06-21 NOTE — Progress Notes (Signed)
ANTICOAGULATION CONSULT NOTE - Follow Up Consult  Pharmacy Consult for Heparin Indication: chest pain/ACS  Allergies  Allergen Reactions  . Cleocin [Clindamycin Hcl] Anaphylaxis and Swelling  . Glimepiride [Amaryl] Other (See Comments)    Elevates liver function   . Clarithromycin Itching and Other (See Comments)    "Biaxin" Eyes itch and burn  . Colchicine Other (See Comments)    Affected kidneys   . Ciprocin-Fluocin-Procin [Fluocinolone Acetonide] Rash    Patient Measurements: Height: 5\' 5"  (165.1 cm) Weight: 230 lb 14.4 oz (104.7 kg) IBW/kg (Calculated) : 61.5 Heparin Dosing Weight: 87 kg  Vital Signs: Temp: 99.2 F (37.3 C) (10/12 0612) Temp Source: Oral (10/12 0612) BP: 155/83 (10/12 0612) Pulse Rate: 99 (10/12 0612)  Labs: Recent Labs    06/19/18 1647  06/20/18 0449 06/20/18 0727  06/20/18 1928 06/21/18 0212 06/21/18 0904  HGB 11.0*  --  10.8*  --   --   --  11.3*  --   HCT 35.8*  --  33.8*  --   --   --  35.2*  --   PLT 262  --  252  --   --   --  204  --   HEPARINUNFRC  --   --   --  0.92*  --  0.51 0.39  --   CREATININE 4.04*  --  4.05*  --   --   --  3.79*  --   TROPONINI  --    < > 0.15*  --    < > 0.22* 0.26* 0.24*   < > = values in this interval not displayed.    Estimated Creatinine Clearance: 23.7 mL/min (A) (by C-G formula based on SCr of 3.79 mg/dL (H)).  Assessment:  57 yr old male on IV heparin for chest pain/ACS while awaiting cardiac cath likely Monday. Patients renal function is improving but still poor.   Heparin level is at goal: 0.39, no overt bleeding reported   Goal of Therapy:  Heparin level 0.3-0.7 units/ml Monitor platelets by anticoagulation protocol: Yes   Plan:  Continue  heparin drip at 950 units/hr Daily heparin level and CBC while on heparin. Follow up cath plans.   Georga Bora, PharmD Clinical Pharmacist 06/21/2018 12:16 PM Please check AMION for all [facility] numbers

## 2018-06-21 NOTE — Progress Notes (Signed)
  Echocardiogram 2D Echocardiogram has been performed.  Matthew Rowe 06/21/2018, 10:19 AM

## 2018-06-21 NOTE — Progress Notes (Signed)
RT placed patient on BIPAP for the night. Patient tolerating well at this time with no respiratory distress noted. RT will monitor as needed.

## 2018-06-21 NOTE — Progress Notes (Signed)
*  PRELIMINARY RESULTS* Echocardiogram 2D Echocardiogram limited with definity has been performed.  Leavy Cella 06/21/2018, 1:10 PM

## 2018-06-21 NOTE — Progress Notes (Signed)
Progress Note  Patient Name: Matthew COPADO Sr. Date of Encounter: 06/21/2018  Primary Cardiologist: Nelva Bush, MD transitioning to Dr. Irish Lack   Patient Profile     57 y.o. male with history of known CAD, CKD stage 5 progressing towards renal transplant with fistula in place, hypertension, chronic angina who presents after having chest pain during a stress test, with report of stress test being significant abnormal. Recurrent in hospital chest pain  Cath anticipated for Monday   Subjective  Without complaint  Breathing better  Inpatient Medications    Scheduled Meds: . allopurinol  100 mg Oral Daily  . aspirin EC  81 mg Oral Daily  . atorvastatin  80 mg Oral Daily  . carvedilol  50 mg Oral BID WC  . cholecalciferol  2,000 Units Oral Daily  . clopidogrel  75 mg Oral Daily  . docusate sodium  100 mg Oral BID  . famotidine  20 mg Oral Daily  . furosemide  60 mg Intravenous Q8H  . gabapentin  300 mg Oral BID  . insulin aspart  0-9 Units Subcutaneous TID WC  . insulin glargine  10 Units Subcutaneous QHS  . multivitamin with minerals  1 tablet Oral Daily  . pantoprazole  40 mg Oral Daily  . ranolazine  500 mg Oral BID  . vitamin E  400 Units Oral Daily   Continuous Infusions: . sodium chloride 10 mL/hr at 06/21/18 0530  . ampicillin-sulbactam (UNASYN) IV 3 g (06/21/18 0548)  . heparin 950 Units/hr (06/21/18 0530)  . nitroGLYCERIN 30 mcg/min (06/21/18 0530)   PRN Meds: acetaminophen, diphenhydrAMINE, guaiFENesin-dextromethorphan, HYDROmorphone (DILAUDID) injection, nitroGLYCERIN, perflutren lipid microspheres (DEFINITY) IV suspension, traMADol, zolpidem   Vital Signs    Vitals:   06/20/18 2041 06/20/18 2106 06/21/18 0203 06/21/18 0612  BP:  (!) 152/89  (!) 155/83  Pulse: (!) 103 99  99  Resp: (!) 31 (!) 35 (!) 22 (!) 29  Temp:    99.2 F (37.3 C)  TempSrc:    Oral  SpO2: 93% 97% 97% 97%  Weight:    104.7 kg  Height:        Intake/Output Summary  (Last 24 hours) at 06/21/2018 1209 Last data filed at 06/21/2018 0530 Gross per 24 hour  Intake 2543.01 ml  Output 2000 ml  Net 543.01 ml   Filed Weights   06/19/18 2102 06/20/18 0533 06/21/18 0612  Weight: 105.6 kg 106.3 kg 104.7 kg    Telemetry    Sinus about 90- Personally Reviewed  ECG       Physical Exam   Well developed and nourished in no acute distress HENT normal Neck supple with JVP-flat Clear Regular rate and rhythm, no murmurs or gallops Abd-soft with active BS No Clubbing cyanosis edema Skin-warm and dry A & Oriented  Grossly normal sensory and motor function   Labs    Chemistry Recent Labs  Lab 06/19/18 1647 06/20/18 0449 06/21/18 0212  NA 137 140 137  K 4.7 4.6 4.1  CL 102 108 103  CO2 23 20* 23  GLUCOSE 176* 146* 144*  BUN 51* 51* 51*  CREATININE 4.04* 4.05* 3.79*  CALCIUM 9.3 9.0 9.1  ALBUMIN  --   --  3.4*  GFRNONAA 15* 15* 16*  GFRAA 17* 17* 19*  ANIONGAP 12 12 11      Hematology Recent Labs  Lab 06/19/18 1647 06/20/18 0449 06/21/18 0212  WBC 11.9* 11.7* 14.9*  RBC 3.83* 3.69* 3.83*  HGB 11.0* 10.8* 11.3*  HCT 35.8* 33.8* 35.2*  MCV 93.5 91.6 91.9  MCH 28.7 29.3 29.5  MCHC 30.7 32.0 32.1  RDW 14.2 14.2 14.3  PLT 262 252 204    Cardiac Enzymes Recent Labs  Lab 06/20/18 1036 06/20/18 1928 06/21/18 0212 06/21/18 0904  TROPONINI 0.19* 0.22* 0.26* 0.24*    Recent Labs  Lab 06/17/18 1949 06/19/18 1651  TROPIPOC 0.00 0.01     BNP Recent Labs  Lab 06/17/18 1934  BNP 181.8*     DDimer No results for input(s): DDIMER in the last 168 hours.   Radiology    Dg Chest 2 View  Result Date: 06/19/2018 CLINICAL DATA:  Chest pain EXAM: CHEST - 2 VIEW COMPARISON:  06/17/2018 FINDINGS: Prior median sternotomy. Heart is normal size. No confluent airspace opacities or effusions. IMPRESSION: No active cardiopulmonary disease. Electronically Signed   By: Rolm Baptise M.D.   On: 06/19/2018 17:38   Dg Chest Port 1  View  Result Date: 06/21/2018 CLINICAL DATA:  Acute respiratory failure. EXAM: PORTABLE CHEST 1 VIEW COMPARISON:  One-view chest x-ray 06/20/2018 FINDINGS: Heart is enlarged. Lung volumes are low. Progressive airspace consolidation is present in the right lung. Right greater than left interstitial disease is noted. IMPRESSION: 1. Progressive interstitial and airspace disease of the right lung consistent with pneumonia. Electronically Signed   By: San Morelle M.D.   On: 06/21/2018 10:18   Dg Chest Port 1 View  Result Date: 06/20/2018 CLINICAL DATA:  Chest pain. EXAM: PORTABLE CHEST 1 VIEW COMPARISON:  Portable film earlier in the day. FINDINGS: Cardiomegaly is redemonstrated. BILATERAL pulmonary opacities, worse on the RIGHT are redemonstrated, and slightly worse. No effusion or pneumothorax. IMPRESSION: Worsening aeration. Electronically Signed   By: Staci Righter M.D.   On: 06/20/2018 19:08   Dg Chest Port 1 View  Result Date: 06/20/2018 CLINICAL DATA:  Dyspnea EXAM: PORTABLE CHEST 1 VIEW COMPARISON:  06/19/2018 FINDINGS: Evolving pulmonary space opacities in the right upper, middle and lower lobes. Left lung is relatively clear. Heart size is top-normal. Median sternotomy sutures are place. Nonaneurysmal minimally atherosclerotic aorta. No acute osseous abnormality. IMPRESSION: Evolving patchy airspace opacities in the right lung suspicious for evolving multilobar pneumonia. Unilateral pulmonary edema is also a possibility depending on clinical presentation. Electronically Signed   By: Ashley Royalty M.D.   On: 06/20/2018 14:05    Cardiac Studies   Per Dr. Darnelle Bos notes Cath/PCI:  LHC (12/02/13): LMCA with 30% distal stenosis. LAD with multiple stents and 50% ostial stenosis followed by 20% diffuse in-stent restenosis proximally. There is diffuse 50% in-stent restenosis in the mid and distal segments. The distal LAD is occluded. LCx is small with 40-50% ostial stenosis. OM 2 has a 50%  ostial stenosis. Ramus intermedius is a large vessel with 70-80% stenosis involving its superior branch. 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 There is diffuse 20% proximal disease. There is a 90% stenosis involving the distal vessel just beyond the anastomosis with the RIMA. 60% stenosis extending into the RPDA is also evident. RIMA to RCA is widely patent with aforementioned stenosis just beyond the distal anastomosis.  PCI (12/03/13): Successful PCI to the mid/distal RCA with overlapping Promus drug-eluting stents (2.25 x 24 and 2.25 x 16 mm).  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   Assessment & Plan    Unstable angina:   CABG  REnal insufficiency  ' Congestive Heart Failure acute HFPEF  Plan is  for cath on Monday and will begin prehydration about 10 hrs before,  16 hrs >> CHF  No chest pain   Much improved following IV diuresis last night    Signed, Virl Axe, MD  06/21/2018, 12:09 PM

## 2018-06-21 NOTE — Progress Notes (Signed)
NAME:  Matthew Pretty., MRN:  [id number], DOB:  Dec 26, 1960, LOS: 2 ADMISSION DATE:  06/19/2018, CONSULTATION DATE:  06/20/2018 REFERRING MD:  Dr. Tyrell Antonio, CHIEF COMPLAINT:  Hypoxia/Chest Pain    Brief History   57 year old male presents with chest pain on 10/10 after pharmacological stress test.   Cardiology consulted with plans for a Cath however patient wanted to wait to speak to Nephrologist before proceeding. Nephrologist consulted and spoke to patient, plans for Cath on Monday 10/14. During this time lasix was held and patient was hydrate in preparation. However, in the evening on 10/11 patient developed increasing chest pain and progressive hypoxia. ABG with PO2 49.2. CXR with bilateral pulmonary opacities (right greater than left). Lasix given, PCCM and Cardiology consulted.   Cardiology with plans to maintain Nitroglycerin gtt and continue with plans for Cath on Monday.   Past Medical History  CAD s/p CABG, Chronic Angina, Chronic Diastolic Heart Failure, DM, HTN, CKD stage IV  Significant Hospital Events   10/10 > Presents to ED   Consults: date of consult/date signed off & final recs:  Cardiology 10/10 Nephrology 10/11 PCCM 10/11   Procedures (surgical and bedside):  N/A   Significant Diagnostic Tests:  CXR 10/10 > No active  CXR 10/11 > Cardiomegaly is redemonstrated. BILATERAL pulmonary opacities, worse on the RIGHT are redemonstrated, and slightly worse. No effusion or pneumothorax  Micro Data:  Blood 10/11 >> U/A 10/11 >>  Antimicrobials:  Unasyn 10/11 >>   Subjective:  Pt is feeling much better, dyspnea decreased, no chest pain .  Wore BIPAP last night ( has OSA on CPAP each night )  O2 sats adequate today , no BIPAP since this am .   Objective   Blood pressure (!) 155/83, pulse 99, temperature 99.2 F (37.3 C), temperature source Oral, resp. rate (!) 29, height 5\' 5"  (1.651 m), weight 104.7 kg, SpO2 97 %.        Intake/Output Summary (Last 24  hours) at 06/21/2018 1315 Last data filed at 06/21/2018 0530 Gross per 24 hour  Intake 2543.01 ml  Output 2000 ml  Net 543.01 ml   Filed Weights   06/19/18 2102 06/20/18 0533 06/21/18 0612  Weight: 105.6 kg 106.3 kg 104.7 kg    Examination: General: Male in NAD on O2  HENT: AT/[place]  Lungs: decreased BS in bases , no wheeze   Cardiovascular: RRR , No MRG  Abdomen: obese , soft, NT , BS +  Extremities: tr edema  Neuro: a/o x 3 , f/c   GU: intact   Resolved Hospital Problem list    Assessment & Plan:   Acute Hypoxic Respiratory Distress in setting of cardiogenic pulmonary edema  H/O OSA on CPAP   -improved with diuresis , BIPAP  Plan  -Maintain Oxygen Saturation >92 -Trend CXR/ABG -continue BIPAP At bedtime  , use during daytime if needed.   Unstable Angina Diastolic HF  H/O CAD s/p CABG, HTN, HLD  Plan -Cardiac Monitoring -Per Cardiology  -Plans for Cath Monday 10/14  -Trend Troponin (Currently 0.22)  -Titrate/Wean Nitro for chest pain goals  -Maintain Heparin gtt   CKD stage IV -Baseline Crt 4-4.5 Plan  -Nephrology Following -Lasix 60 Three times a day    Leukocytosis, Likely Reactive given clinical picture Afebrile  Plan -Trend WBC and Fever -Follow Culture Data  -Currently on Unasyn (Covering for possible CAP)   Disposition / Summary of Today's Plan 06/21/18   Will maintain in Step-Down at this time.  Please call back if change in patient condition.     Diet: Heart healthy  DVT prophylaxis: Heparin gtt  Mobility: Bedrest  Code Status: Full Code  Family Communication: pt/famiy updated at bedside   Labs   CBC: Recent Labs  Lab 06/17/18 1934 06/19/18 1647 06/20/18 0449 06/21/18 0212  WBC 10.7* 11.9* 11.7* 14.9*  HGB 10.8* 11.0* 10.8* 11.3*  HCT 34.8* 35.8* 33.8* 35.2*  MCV 92.6 93.5 91.6 91.9  PLT 251 262 252 518    Basic Metabolic Panel: Recent Labs  Lab 06/17/18 1934 06/19/18 1647 06/20/18 0449 06/21/18 0212  NA 136 137 140 137    K 4.1 4.7 4.6 4.1  CL 104 102 108 103  CO2 23 23 20* 23  GLUCOSE 186* 176* 146* 144*  BUN 45* 51* 51* 51*  CREATININE 3.89* 4.04* 4.05* 3.79*  CALCIUM 9.1 9.3 9.0 9.1  PHOS  --   --   --  3.3   GFR: Estimated Creatinine Clearance: 23.7 mL/min (A) (by C-G formula based on SCr of 3.79 mg/dL (H)). Recent Labs  Lab 06/17/18 1934 06/19/18 1647 06/20/18 0449 06/20/18 1928 06/21/18 0212  PROCALCITON  --   --   --  0.10 0.28  WBC 10.7* 11.9* 11.7*  --  14.9*  LATICACIDVEN  --   --   --  2.6* 1.4    Liver Function Tests: Recent Labs  Lab 06/21/18 0212  ALBUMIN 3.4*   No results for input(s): LIPASE, AMYLASE in the last 168 hours. No results for input(s): AMMONIA in the last 168 hours.  ABG    Component Value Date/Time   PHART 7.406 06/20/2018 1800   PCO2ART 35.0 06/20/2018 1800   PO2ART 49.2 (L) 06/20/2018 1800   HCO3 21.6 06/20/2018 1800   TCO2 21 12/01/2010 0356   ACIDBASEDEF 2.4 (H) 06/20/2018 1800   O2SAT 83.7 06/20/2018 1800     Coagulation Profile: Recent Labs  Lab 06/17/18 1934  INR 0.96    Cardiac Enzymes: Recent Labs  Lab 06/20/18 0449 06/20/18 1036 06/20/18 1928 06/21/18 0212 06/21/18 0904  TROPONINI 0.15* 0.19* 0.22* 0.26* 0.24*    HbA1C: Hgb A1c MFr Bld  Date/Time Value Ref Range Status  05/29/2018 08:11 AM 7.0 (H) 4.8 - 5.6 % Final    Comment:    (NOTE) Pre diabetes:          5.7%-6.4% Diabetes:              >6.4% Glycemic control for   <7.0% adults with diabetes   04/04/2016 08:06 AM 6.9 (H) 4.8 - 5.6 % Final    Comment:    (NOTE)         Pre-diabetes: 5.7 - 6.4         Diabetes: >6.4         Glycemic control for adults with diabetes: <7.0     CBG: Recent Labs  Lab 06/20/18 1117 06/20/18 1658 06/20/18 2233 06/21/18 0730 06/21/18 1118  GLUCAP 221* 119* 153* 131* 117*    Admitting History of Present Illness.   As above      Critical care time:     Shantia Sanford NP-C  [facility]   [phone]

## 2018-06-21 NOTE — Progress Notes (Signed)
Admit: 06/19/2018 LOS: 2  26M CKD4 from ADPKD with [place], hx/o CAD and CABG  Subjective:  Marland Kitchen Hypoxia, dyspnea overnight related to pulmonary edema from IV fluids and holding diuretics in anticipation of cardiac catheterization . Furosemide restarted, more than 2 L urine output, dyspnea improved this morning but remains on 6 L nasal cannula . Renal function stable at the current time, creatinine 3.79, potassium 4.1, bicarbonate 23, BUN 51 . Currently on furosemide 60 mg IV twice daily  10/11 0701 - 10/12 0700 In: 2663 [P.O.:360; I.V.:2303] Out: 2300 [Urine:2300]  Filed Weights   06/19/18 2102 06/20/18 0533 06/21/18 0612  Weight: 105.6 kg 106.3 kg 104.7 kg    Scheduled Meds: . allopurinol  100 mg Oral Daily  . aspirin EC  81 mg Oral Daily  . atorvastatin  80 mg Oral Daily  . carvedilol  50 mg Oral BID WC  . cholecalciferol  2,000 Units Oral Daily  . clopidogrel  75 mg Oral Daily  . famotidine  20 mg Oral Daily  . furosemide  60 mg Intravenous Q12H  . gabapentin  300 mg Oral BID  . insulin aspart  0-9 Units Subcutaneous TID WC  . insulin glargine  10 Units Subcutaneous QHS  . multivitamin with minerals  1 tablet Oral Daily  . pantoprazole  40 mg Oral Daily  . ranolazine  500 mg Oral BID  . vitamin E  400 Units Oral Daily   Continuous Infusions: . sodium chloride 10 mL/hr at 06/21/18 0530  . ampicillin-sulbactam (UNASYN) IV 3 g (06/21/18 0548)  . heparin 950 Units/hr (06/21/18 0530)  . nitroGLYCERIN 30 mcg/min (06/21/18 0530)   PRN Meds:.acetaminophen, diphenhydrAMINE, guaiFENesin-dextromethorphan, HYDROmorphone (DILAUDID) injection, nitroGLYCERIN, perflutren lipid microspheres (DEFINITY) IV suspension, traMADol, zolpidem  Current Labs: reviewed    Physical Exam:  Blood pressure (!) 155/83, pulse 99, temperature 99.2 F (37.3 C), temperature source Oral, resp. rate (!) 29, height 5\' 5"  (1.651 m), weight 104.7 kg, SpO2 97 %. GEN: No acute distress, sitting in chair,  obese ENT: NCAT EYES: EOMI CV: Regular, normal S1 and S2.  No rub PULM:  Diminished in the bases, bronchial breath sounds ABD: Soft, nontender, obese SKIN: Left upper extremity is healing well where the surgical scar is from AV fistula, bruit and thrill are present EXT:  1+ lower extremity edema  A 1. CKD4, at baseline; s/p 1st stage BVR 05/2018 VVS 2. NSTEMI, hx/o CAD and CABG; rec for LHC by Cardiology; on BB, DAPT, Hep gtt 3. Pulmonary edema, acute exacerbation of CHF; Unasyn for possible pneumonia 4. HTN, stable 5. Obesity  P . Continue diuresis, inc to q8h . Yesterday discussed that he is at significant risk for contrast nephropathy and potential short or long-term dialysis dependence with cardiac catheterization; however he wishes to pursue transplant and is having an active MI.  He is willing to move forward with cardiac catheterization with nephrology following . I am not sure that we will be able to hydrate him prior to catheterization given his tenuous volume status, best case might be to hold his diuretic dosing immediately before catheterization . Daily weights, Daily Renal Panel, Strict I/Os, Avoid nephrotoxins (NSAIDs, judicious IV Contrast)  . Medication Issues; o Preferred narcotic agents for pain control are hydromorphone, fentanyl, and methadone. Morphine should not be used.  o Baclofen should be avoided o Avoid oral sodium phosphate and magnesium citrate based laxatives / bowel preps    Pearson Grippe MD 06/21/2018, 10:05 AM  Recent Labs  Lab 06/19/18  1647 06/20/18 0449 06/21/18 0212  NA 137 140 137  K 4.7 4.6 4.1  CL 102 108 103  CO2 23 20* 23  GLUCOSE 176* 146* 144*  BUN 51* 51* 51*  CREATININE 4.04* 4.05* 3.79*  CALCIUM 9.3 9.0 9.1  PHOS  --   --  3.3   Recent Labs  Lab 06/19/18 1647 06/20/18 0449 06/21/18 0212  WBC 11.9* 11.7* 14.9*  HGB 11.0* 10.8* 11.3*  HCT 35.8* 33.8* 35.2*  MCV 93.5 91.6 91.9  PLT 262 252 204

## 2018-06-22 ENCOUNTER — Inpatient Hospital Stay (HOSPITAL_COMMUNITY): Payer: Medicare HMO

## 2018-06-22 LAB — GLUCOSE, CAPILLARY
GLUCOSE-CAPILLARY: 151 mg/dL — AB (ref 70–99)
GLUCOSE-CAPILLARY: 146 mg/dL — AB (ref 70–99)
Glucose-Capillary: 138 mg/dL — ABNORMAL HIGH (ref 70–99)
Glucose-Capillary: 203 mg/dL — ABNORMAL HIGH (ref 70–99)

## 2018-06-22 LAB — CBC
HEMATOCRIT: 31.3 % — AB (ref 39.0–52.0)
HEMOGLOBIN: 10.1 g/dL — AB (ref 13.0–17.0)
MCH: 29.3 pg (ref 26.0–34.0)
MCHC: 32.3 g/dL (ref 30.0–36.0)
MCV: 90.7 fL (ref 80.0–100.0)
NRBC: 0 % (ref 0.0–0.2)
Platelets: 194 10*3/uL (ref 150–400)
RBC: 3.45 MIL/uL — AB (ref 4.22–5.81)
RDW: 14.3 % (ref 11.5–15.5)
WBC: 11.3 10*3/uL — ABNORMAL HIGH (ref 4.0–10.5)

## 2018-06-22 LAB — RENAL FUNCTION PANEL
ALBUMIN: 3.1 g/dL — AB (ref 3.5–5.0)
Anion gap: 12 (ref 5–15)
BUN: 56 mg/dL — AB (ref 6–20)
CHLORIDE: 102 mmol/L (ref 98–111)
CO2: 22 mmol/L (ref 22–32)
CREATININE: 4.52 mg/dL — AB (ref 0.61–1.24)
Calcium: 8.9 mg/dL (ref 8.9–10.3)
GFR calc non Af Amer: 13 mL/min — ABNORMAL LOW (ref >60)
GFR, EST AFRICAN AMERICAN: 15 mL/min — AB (ref >60)
GLUCOSE: 144 mg/dL — AB (ref 70–99)
POTASSIUM: 4.1 mmol/L (ref 3.5–5.1)
Phosphorus: 3.7 mg/dL (ref 2.5–4.6)
SODIUM: 136 mmol/L (ref 135–145)

## 2018-06-22 LAB — HEPARIN LEVEL (UNFRACTIONATED)
HEPARIN UNFRACTIONATED: 0.43 [IU]/mL (ref 0.30–0.70)
Heparin Unfractionated: 0.21 IU/mL — ABNORMAL LOW (ref 0.30–0.70)
Heparin Unfractionated: >2.2 IU/mL — ABNORMAL HIGH (ref 0.30–0.70)

## 2018-06-22 LAB — PROCALCITONIN: Procalcitonin: 0.59 ng/mL

## 2018-06-22 MED ORDER — FUROSEMIDE 10 MG/ML IJ SOLN
60.0000 mg | Freq: Once | INTRAMUSCULAR | Status: AC
  Administered 2018-06-22: 60 mg via INTRAVENOUS
  Filled 2018-06-22: qty 6

## 2018-06-22 MED ORDER — SORBITOL 70 % SOLN
30.0000 mL | Freq: Once | Status: DC
  Filled 2018-06-22: qty 30

## 2018-06-22 NOTE — Progress Notes (Signed)
Progress Note  Patient Name: Matthew DAYRIT Sr. Date of Encounter: 06/22/2018  Primary Cardiologist: Nelva Bush, MD transitioning to Dr. Irish Lack   Patient Profile     57 y.o. male with history of known CAD, CKD stage 5 progressing towards renal transplant with fistula in place, hypertension, chronic angina who presents after having chest pain during a stress test, with report of stress test being significant abnormal.   Cath anticipated for Monday   Subjective  No chest pain  No sob  Reviewed plan for fluids   Inpatient Medications    Scheduled Meds: . allopurinol  100 mg Oral Daily  . [START ON 06/23/2018] aspirin  81 mg Oral Pre-Cath  . aspirin EC  81 mg Oral Daily  . atorvastatin  80 mg Oral Daily  . carvedilol  50 mg Oral BID WC  . cholecalciferol  2,000 Units Oral Daily  . clopidogrel  75 mg Oral Daily  . docusate sodium  100 mg Oral BID  . doxycycline  100 mg Oral Q12H  . famotidine  20 mg Oral Daily  . furosemide  60 mg Intravenous Q8H  . gabapentin  300 mg Oral BID  . insulin aspart  0-9 Units Subcutaneous TID WC  . insulin glargine  10 Units Subcutaneous QHS  . multivitamin with minerals  1 tablet Oral Daily  . pantoprazole  40 mg Oral Daily  . ranolazine  500 mg Oral BID  . sodium chloride flush  3 mL Intravenous Q12H  . sorbitol  30 mL Oral Once  . vitamin E  400 Units Oral Daily   Continuous Infusions: . sodium chloride 500 mL (06/21/18 1544)  . sodium chloride    . ampicillin-sulbactam (UNASYN) IV 3 g (06/22/18 0551)  . heparin 1,100 Units/hr (06/22/18 0520)  . nitroGLYCERIN 30 mcg/min (06/21/18 1900)   PRN Meds: sodium chloride, acetaminophen, diphenhydrAMINE, guaiFENesin-dextromethorphan, HYDROmorphone (DILAUDID) injection, nitroGLYCERIN, sodium chloride flush, traMADol, zolpidem   Vital Signs    Vitals:   06/21/18 2000 06/21/18 2119 06/21/18 2316 06/22/18 0510  BP:  (!) 141/72  137/78  Pulse: 96 97 98 99  Resp: 20 20 (!) 26 (!) 36   Temp:  98.8 F (37.1 C)  98.5 F (36.9 C)  TempSrc:  Oral  Oral  SpO2: 94% 93% 96% 93%  Weight:    104.4 kg  Height:        Intake/Output Summary (Last 24 hours) at 06/22/2018 1136 Last data filed at 06/22/2018 1047 Gross per 24 hour  Intake 422.01 ml  Output 3900 ml  Net -3477.99 ml   Filed Weights   06/20/18 0533 06/21/18 0612 06/22/18 0510  Weight: 106.3 kg 104.7 kg 104.4 kg    Telemetry    Sinus 90s- Personally Reviewed  ECG       Physical Exam  Well developed and nourished in no acute distress HENT normal Neck supple with JVP-flat Clear Regular rate and rhythm, no murmurs or gallops Abd-soft with active BS No Clubbing cyanosis edema Skin-warm and dry A & Oriented  Grossly normal sensory and motor function   Labs    Chemistry Recent Labs  Lab 06/20/18 0449 06/21/18 0212 06/22/18 0410  NA 140 137 136  K 4.6 4.1 4.1  CL 108 103 102  CO2 20* 23 22  GLUCOSE 146* 144* 144*  BUN 51* 51* 56*  CREATININE 4.05* 3.79* 4.52*  CALCIUM 9.0 9.1 8.9  ALBUMIN  --  3.4* 3.1*  GFRNONAA 15* 16* 13*  GFRAA  17* 19* 15*  ANIONGAP 12 11 12      Hematology Recent Labs  Lab 06/20/18 0449 06/21/18 0212 06/22/18 0410  WBC 11.7* 14.9* 11.3*  RBC 3.69* 3.83* 3.45*  HGB 10.8* 11.3* 10.1*  HCT 33.8* 35.2* 31.3*  MCV 91.6 91.9 90.7  MCH 29.3 29.5 29.3  MCHC 32.0 32.1 32.3  RDW 14.2 14.3 14.3  PLT 252 204 194    Cardiac Enzymes Recent Labs  Lab 06/20/18 1036 06/20/18 1928 06/21/18 0212 06/21/18 0904  TROPONINI 0.19* 0.22* 0.26* 0.24*    Recent Labs  Lab 06/17/18 1949 06/19/18 1651  TROPIPOC 0.00 0.01     BNP Recent Labs  Lab 06/17/18 1934  BNP 181.8*     DDimer No results for input(s): DDIMER in the last 168 hours.   Radiology    Dg Chest Port 1 View  Result Date: 06/22/2018 CLINICAL DATA:  Acute hypoxic respiratory failure. EXAM: PORTABLE CHEST 1 VIEW COMPARISON:  Chest x-rays dated 06/21/2018 and 06/20/2018. FINDINGS: Stable  cardiomegaly. Improved aeration within the RIGHT lung, compatible with resolving edema or resolving pneumonia. No new lung abnormality. No pleural effusion or pneumothorax seen. IMPRESSION: Improved aeration of the RIGHT lung, suggesting improved fluid status and/or resolving pneumonia. Electronically Signed   By: Franki Cabot M.D.   On: 06/22/2018 10:38   Dg Chest Port 1 View  Result Date: 06/21/2018 CLINICAL DATA:  Acute respiratory failure. EXAM: PORTABLE CHEST 1 VIEW COMPARISON:  One-view chest x-ray 06/20/2018 FINDINGS: Heart is enlarged. Lung volumes are low. Progressive airspace consolidation is present in the right lung. Right greater than left interstitial disease is noted. IMPRESSION: 1. Progressive interstitial and airspace disease of the right lung consistent with pneumonia. Electronically Signed   By: San Morelle M.D.   On: 06/21/2018 10:18   Dg Chest Port 1 View  Result Date: 06/20/2018 CLINICAL DATA:  Chest pain. EXAM: PORTABLE CHEST 1 VIEW COMPARISON:  Portable film earlier in the day. FINDINGS: Cardiomegaly is redemonstrated. BILATERAL pulmonary opacities, worse on the RIGHT are redemonstrated, and slightly worse. No effusion or pneumothorax. IMPRESSION: Worsening aeration. Electronically Signed   By: Staci Righter M.D.   On: 06/20/2018 19:08   Dg Chest Port 1 View  Result Date: 06/20/2018 CLINICAL DATA:  Dyspnea EXAM: PORTABLE CHEST 1 VIEW COMPARISON:  06/19/2018 FINDINGS: Evolving pulmonary space opacities in the right upper, middle and lower lobes. Left lung is relatively clear. Heart size is top-normal. Median sternotomy sutures are place. Nonaneurysmal minimally atherosclerotic aorta. No acute osseous abnormality. IMPRESSION: Evolving patchy airspace opacities in the right lung suspicious for evolving multilobar pneumonia. Unilateral pulmonary edema is also a possibility depending on clinical presentation. Electronically Signed   By: Ashley Royalty M.D.   On: 06/20/2018  14:05    Cardiac Studies   Per Dr. Darnelle Bos notes Cath/PCI:  LHC (12/02/13): LMCA with 30% distal stenosis. LAD with multiple stents and 50% ostial stenosis followed by 20% diffuse in-stent restenosis proximally. There is diffuse 50% in-stent restenosis in the mid and distal segments. The distal LAD is occluded. LCx is small with 40-50% ostial stenosis. OM 2 has a 50% ostial stenosis. Ramus intermedius is a large vessel with 70-80% stenosis involving its superior branch. 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 There is diffuse 20% proximal disease. There is a 90% stenosis involving the distal vessel just beyond the anastomosis with the RIMA. 60% stenosis extending into the RPDA is also evident. RIMA to RCA is widely patent with aforementioned stenosis just  beyond the distal anastomosis.  PCI (12/03/13): Successful PCI to the mid/distal RCA with overlapping Promus drug-eluting stents (2.25 x 24 and 2.25 x 16 mm).  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   Assessment & Plan    Unstable angina:   CABG  REnal insufficiency  ' Congestive Heart Failure acute HFPEF  Have discussed with hospitalist and renal.  Dr. Joelyn Oms is concerned more about heart failure and would like him to be prehydrated for only 3 hours prior to his procedure and holding his diuretics for 24 hours.  Will arrange for cathing  MD to initiate fluids per schedule       Signed, Virl Axe, MD  06/22/2018, 11:36 AM

## 2018-06-22 NOTE — Progress Notes (Signed)
[place] for Heparin Indication: chest pain/ACS  Allergies  Allergen Reactions  . Cleocin [Clindamycin Hcl] Anaphylaxis and Swelling  . Glimepiride [Amaryl] Other (See Comments)    Elevates liver function   . Clarithromycin Itching and Other (See Comments)    "Biaxin" Eyes itch and burn  . Colchicine Other (See Comments)    Affected kidneys   . Ciprocin-Fluocin-Procin [Fluocinolone Acetonide] Rash    Patient Measurements: Height: 5\' 5"  (165.1 cm) Weight: 230 lb 14.4 oz (104.7 kg) IBW/kg (Calculated) : 61.5 Heparin Dosing Weight: 87 kg  Vital Signs: Temp: 98.8 F (37.1 C) (10/12 2119) Temp Source: Oral (10/12 2119) BP: 141/72 (10/12 2119) Pulse Rate: 98 (10/12 2316)  Labs: Recent Labs    06/19/18 1647  06/20/18 0449  06/20/18 1928 06/21/18 0212 06/21/18 0904 06/22/18 0410  HGB 11.0*  --  10.8*  --   --  11.3*  --  10.1*  HCT 35.8*  --  33.8*  --   --  35.2*  --  31.3*  PLT 262  --  252  --   --  204  --  194  HEPARINUNFRC  --   --   --    < > 0.51 0.39  --  0.21*  CREATININE 4.04*  --  4.05*  --   --  3.79*  --   --   TROPONINI  --    < > 0.15*   < > 0.22* 0.26* 0.24*  --    < > = values in this interval not displayed.    Estimated Creatinine Clearance: 23.7 mL/min (A) (by C-G formula based on SCr of 3.79 mg/dL (H)).  Assessment:   57 y.o. male with chest pain awaiting cath for heparin  Goal of Therapy:  Heparin level 0.3-0.7 units/ml Monitor platelets by anticoagulation protocol: Yes   Plan:  Increase Heparin 1100 units/hr  Phillis Knack, PharmD, BCPS

## 2018-06-22 NOTE — Progress Notes (Signed)
Admit: 06/19/2018 LOS: 3  46M CKD4 from ADPKD with [place], hx/o CAD and CABG  Subjective:  . Weaned down on nasal cannula, breathing improved . Creatinine 4.5, worsened compared to yesterday but within historical recent baseline . Excellent urine output on furosemide: 3.1 L . Potassium 4.1, bicarbonate 22, BUN 56  10/12 0701 - 10/13 0700 In: 422 [I.V.:316.3; IV Piggyback:105.7] Out: 3050 [Urine:3050]  Filed Weights   06/20/18 0533 06/21/18 0612 06/22/18 0510  Weight: 106.3 kg 104.7 kg 104.4 kg    Scheduled Meds: . allopurinol  100 mg Oral Daily  . aspirin EC  81 mg Oral Daily  . atorvastatin  80 mg Oral Daily  . carvedilol  50 mg Oral BID WC  . cholecalciferol  2,000 Units Oral Daily  . clopidogrel  75 mg Oral Daily  . docusate sodium  100 mg Oral BID  . doxycycline  100 mg Oral Q12H  . famotidine  20 mg Oral Daily  . furosemide  60 mg Intravenous Q8H  . gabapentin  300 mg Oral BID  . insulin aspart  0-9 Units Subcutaneous TID WC  . insulin glargine  10 Units Subcutaneous QHS  . multivitamin with minerals  1 tablet Oral Daily  . pantoprazole  40 mg Oral Daily  . ranolazine  500 mg Oral BID  . sodium chloride flush  3 mL Intravenous Q12H  . sorbitol  30 mL Oral Once  . vitamin E  400 Units Oral Daily   Continuous Infusions: . sodium chloride 500 mL (06/21/18 1544)  . sodium chloride    . ampicillin-sulbactam (UNASYN) IV 3 g (06/22/18 0551)  . heparin 1,100 Units/hr (06/22/18 0520)  . nitroGLYCERIN 30 mcg/min (06/21/18 1900)   PRN Meds:.sodium chloride, acetaminophen, diphenhydrAMINE, guaiFENesin-dextromethorphan, HYDROmorphone (DILAUDID) injection, nitroGLYCERIN, sodium chloride flush, traMADol, zolpidem  Current Labs: reviewed    Physical Exam:  Blood pressure 137/78, pulse 99, temperature 98.5 F (36.9 C), temperature source Oral, resp. rate (!) 36, height 5\' 5"  (1.651 m), weight 104.4 kg, SpO2 93 %. GEN: No acute distress, sitting in chair, obese ENT:  NCAT EYES: EOMI CV: Regular, normal S1 and S2.  No rub PULM:  Diminished in the bases, bronchial breath sounds ABD: Soft, nontender, obese SKIN: Left upper extremity is healing well where the surgical scar is from AV fistula, bruit and thrill are present EXT:  1+ lower extremity edema  A 1. CKD4, at baseline; s/p 1st stage BVR 05/2018 VVS 2. NSTEMI, hx/o CAD and CABG; rec for LHC by Cardiology; on BB, DAPT, Hep gtt 3. Pulmonary edema, acute exacerbation of CHF; Unasyn for possible pneumonia 4. HTN, stable 5. Obesity  P . Will hold last dose of diuretics this evening and tomorrow . Provide a more brief precath hydration starting tomorrow morning . 10/11 discussed that he is at significant risk for contrast nephropathy and potential short or long-term dialysis dependence with cardiac catheterization; however he wishes to pursue transplant and is having an active MI.  He is willing to move forward with cardiac catheterization with nephrology following . We will follow closely . Daily weights, Daily Renal Panel, Strict I/Os, Avoid nephrotoxins (NSAIDs, judicious IV Contrast)    Pearson Grippe MD 06/22/2018, 12:10 PM  Recent Labs  Lab 06/20/18 0449 06/21/18 0212 06/22/18 0410  NA 140 137 136  K 4.6 4.1 4.1  CL 108 103 102  CO2 20* 23 22  GLUCOSE 146* 144* 144*  BUN 51* 51* 56*  CREATININE 4.05* 3.79* 4.52*  CALCIUM 9.0  9.1 8.9  PHOS  --  3.3 3.7   Recent Labs  Lab 06/20/18 0449 06/21/18 0212 06/22/18 0410  WBC 11.7* 14.9* 11.3*  HGB 10.8* 11.3* 10.1*  HCT 33.8* 35.2* 31.3*  MCV 91.6 91.9 90.7  PLT 252 204 194

## 2018-06-22 NOTE — Progress Notes (Signed)
[place] for Heparin Indication: chest pain/ACS  Allergies  Allergen Reactions  . Cleocin [Clindamycin Hcl] Anaphylaxis and Swelling  . Glimepiride [Amaryl] Other (See Comments)    Elevates liver function   . Clarithromycin Itching and Other (See Comments)    "Biaxin" Eyes itch and burn  . Colchicine Other (See Comments)    Affected kidneys   . Ciprocin-Fluocin-Procin [Fluocinolone Acetonide] Rash    Patient Measurements: Height: 5\' 5"  (165.1 cm) Weight: 230 lb 2.6 oz (104.4 kg) IBW/kg (Calculated) : 61.5 Heparin Dosing Weight: 87 kg  Vital Signs: Temp: 98.4 F (36.9 C) (10/13 1354) Temp Source: Oral (10/13 1354) BP: 127/74 (10/13 1354) Pulse Rate: 88 (10/13 1354)  Labs: Recent Labs    06/20/18 0449  06/20/18 1928 06/21/18 0212 06/21/18 0904 06/22/18 0410 06/22/18 1228 06/22/18 1552  HGB 10.8*  --   --  11.3*  --  10.1*  --   --   HCT 33.8*  --   --  35.2*  --  31.3*  --   --   PLT 252  --   --  204  --  194  --   --   HEPARINUNFRC  --    < > 0.51 0.39  --  0.21* >2.20* 0.43  CREATININE 4.05*  --   --  3.79*  --  4.52*  --   --   TROPONINI 0.15*   < > 0.22* 0.26* 0.24*  --   --   --    < > = values in this interval not displayed.    Estimated Creatinine Clearance: 19.8 mL/min (A) (by C-G formula based on SCr of 4.52 mg/dL (H)).  Assessment:   57 y.o. male with chest pain awaiting cath for heparin. HL 0.43 this PM.   Goal of Therapy:  Heparin level 0.3-0.7 units/ml Monitor platelets by anticoagulation protocol: Yes   Plan:  Continue Heparin at 1100 units/hr Confirmatory Heparin level in 6 hours  Kayci [place] A. Levada Dy, PharmD, [place] Pager: [phone] Please utilize Amion for appropriate phone number to reach the unit pharmacist ([facility])

## 2018-06-22 NOTE — H&P (View-Only) (Signed)
Progress Note  Patient Name: Matthew OKIMOTO Sr. Date of Encounter: 06/22/2018  Primary Cardiologist: Nelva Bush, MD transitioning to Dr. Irish Lack   Patient Profile     57 y.o. male with history of known CAD, CKD stage 5 progressing towards renal transplant with fistula in place, hypertension, chronic angina who presents after having chest pain during a stress test, with report of stress test being significant abnormal.   Cath anticipated for Monday   Subjective  No chest pain  No sob  Reviewed plan for fluids   Inpatient Medications    Scheduled Meds: . allopurinol  100 mg Oral Daily  . [START ON 06/23/2018] aspirin  81 mg Oral Pre-Cath  . aspirin EC  81 mg Oral Daily  . atorvastatin  80 mg Oral Daily  . carvedilol  50 mg Oral BID WC  . cholecalciferol  2,000 Units Oral Daily  . clopidogrel  75 mg Oral Daily  . docusate sodium  100 mg Oral BID  . doxycycline  100 mg Oral Q12H  . famotidine  20 mg Oral Daily  . furosemide  60 mg Intravenous Q8H  . gabapentin  300 mg Oral BID  . insulin aspart  0-9 Units Subcutaneous TID WC  . insulin glargine  10 Units Subcutaneous QHS  . multivitamin with minerals  1 tablet Oral Daily  . pantoprazole  40 mg Oral Daily  . ranolazine  500 mg Oral BID  . sodium chloride flush  3 mL Intravenous Q12H  . sorbitol  30 mL Oral Once  . vitamin E  400 Units Oral Daily   Continuous Infusions: . sodium chloride 500 mL (06/21/18 1544)  . sodium chloride    . ampicillin-sulbactam (UNASYN) IV 3 g (06/22/18 0551)  . heparin 1,100 Units/hr (06/22/18 0520)  . nitroGLYCERIN 30 mcg/min (06/21/18 1900)   PRN Meds: sodium chloride, acetaminophen, diphenhydrAMINE, guaiFENesin-dextromethorphan, HYDROmorphone (DILAUDID) injection, nitroGLYCERIN, sodium chloride flush, traMADol, zolpidem   Vital Signs    Vitals:   06/21/18 2000 06/21/18 2119 06/21/18 2316 06/22/18 0510  BP:  (!) 141/72  137/78  Pulse: 96 97 98 99  Resp: 20 20 (!) 26 (!) 36   Temp:  98.8 F (37.1 C)  98.5 F (36.9 C)  TempSrc:  Oral  Oral  SpO2: 94% 93% 96% 93%  Weight:    104.4 kg  Height:        Intake/Output Summary (Last 24 hours) at 06/22/2018 1136 Last data filed at 06/22/2018 1047 Gross per 24 hour  Intake 422.01 ml  Output 3900 ml  Net -3477.99 ml   Filed Weights   06/20/18 0533 06/21/18 0612 06/22/18 0510  Weight: 106.3 kg 104.7 kg 104.4 kg    Telemetry    Sinus 90s- Personally Reviewed  ECG       Physical Exam  Well developed and nourished in no acute distress HENT normal Neck supple with JVP-flat Clear Regular rate and rhythm, no murmurs or gallops Abd-soft with active BS No Clubbing cyanosis edema Skin-warm and dry A & Oriented  Grossly normal sensory and motor function   Labs    Chemistry Recent Labs  Lab 06/20/18 0449 06/21/18 0212 06/22/18 0410  NA 140 137 136  K 4.6 4.1 4.1  CL 108 103 102  CO2 20* 23 22  GLUCOSE 146* 144* 144*  BUN 51* 51* 56*  CREATININE 4.05* 3.79* 4.52*  CALCIUM 9.0 9.1 8.9  ALBUMIN  --  3.4* 3.1*  GFRNONAA 15* 16* 13*  GFRAA  17* 19* 15*  ANIONGAP 12 11 12      Hematology Recent Labs  Lab 06/20/18 0449 06/21/18 0212 06/22/18 0410  WBC 11.7* 14.9* 11.3*  RBC 3.69* 3.83* 3.45*  HGB 10.8* 11.3* 10.1*  HCT 33.8* 35.2* 31.3*  MCV 91.6 91.9 90.7  MCH 29.3 29.5 29.3  MCHC 32.0 32.1 32.3  RDW 14.2 14.3 14.3  PLT 252 204 194    Cardiac Enzymes Recent Labs  Lab 06/20/18 1036 06/20/18 1928 06/21/18 0212 06/21/18 0904  TROPONINI 0.19* 0.22* 0.26* 0.24*    Recent Labs  Lab 06/17/18 1949 06/19/18 1651  TROPIPOC 0.00 0.01     BNP Recent Labs  Lab 06/17/18 1934  BNP 181.8*     DDimer No results for input(s): DDIMER in the last 168 hours.   Radiology    Dg Chest Port 1 View  Result Date: 06/22/2018 CLINICAL DATA:  Acute hypoxic respiratory failure. EXAM: PORTABLE CHEST 1 VIEW COMPARISON:  Chest x-rays dated 06/21/2018 and 06/20/2018. FINDINGS: Stable  cardiomegaly. Improved aeration within the RIGHT lung, compatible with resolving edema or resolving pneumonia. No new lung abnormality. No pleural effusion or pneumothorax seen. IMPRESSION: Improved aeration of the RIGHT lung, suggesting improved fluid status and/or resolving pneumonia. Electronically Signed   By: Franki Cabot M.D.   On: 06/22/2018 10:38   Dg Chest Port 1 View  Result Date: 06/21/2018 CLINICAL DATA:  Acute respiratory failure. EXAM: PORTABLE CHEST 1 VIEW COMPARISON:  One-view chest x-ray 06/20/2018 FINDINGS: Heart is enlarged. Lung volumes are low. Progressive airspace consolidation is present in the right lung. Right greater than left interstitial disease is noted. IMPRESSION: 1. Progressive interstitial and airspace disease of the right lung consistent with pneumonia. Electronically Signed   By: San Morelle M.D.   On: 06/21/2018 10:18   Dg Chest Port 1 View  Result Date: 06/20/2018 CLINICAL DATA:  Chest pain. EXAM: PORTABLE CHEST 1 VIEW COMPARISON:  Portable film earlier in the day. FINDINGS: Cardiomegaly is redemonstrated. BILATERAL pulmonary opacities, worse on the RIGHT are redemonstrated, and slightly worse. No effusion or pneumothorax. IMPRESSION: Worsening aeration. Electronically Signed   By: Staci Righter M.D.   On: 06/20/2018 19:08   Dg Chest Port 1 View  Result Date: 06/20/2018 CLINICAL DATA:  Dyspnea EXAM: PORTABLE CHEST 1 VIEW COMPARISON:  06/19/2018 FINDINGS: Evolving pulmonary space opacities in the right upper, middle and lower lobes. Left lung is relatively clear. Heart size is top-normal. Median sternotomy sutures are place. Nonaneurysmal minimally atherosclerotic aorta. No acute osseous abnormality. IMPRESSION: Evolving patchy airspace opacities in the right lung suspicious for evolving multilobar pneumonia. Unilateral pulmonary edema is also a possibility depending on clinical presentation. Electronically Signed   By: Ashley Royalty M.D.   On: 06/20/2018  14:05    Cardiac Studies   Per Dr. Darnelle Bos notes Cath/PCI:  LHC (12/02/13): LMCA with 30% distal stenosis. LAD with multiple stents and 50% ostial stenosis followed by 20% diffuse in-stent restenosis proximally. There is diffuse 50% in-stent restenosis in the mid and distal segments. The distal LAD is occluded. LCx is small with 40-50% ostial stenosis. OM 2 has a 50% ostial stenosis. Ramus intermedius is a large vessel with 70-80% stenosis involving its superior branch. 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 There is diffuse 20% proximal disease. There is a 90% stenosis involving the distal vessel just beyond the anastomosis with the RIMA. 60% stenosis extending into the RPDA is also evident. RIMA to RCA is widely patent with aforementioned stenosis just  beyond the distal anastomosis.  PCI (12/03/13): Successful PCI to the mid/distal RCA with overlapping Promus drug-eluting stents (2.25 x 24 and 2.25 x 16 mm).  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   Assessment & Plan    Unstable angina:   CABG  REnal insufficiency  ' Congestive Heart Failure acute HFPEF  Have discussed with hospitalist and renal.  Dr. Joelyn Oms is concerned more about heart failure and would like him to be prehydrated for only 3 hours prior to his procedure and holding his diuretics for 24 hours.  Will arrange for cathing  MD to initiate fluids per schedule       Signed, Virl Axe, MD  06/22/2018, 11:36 AM

## 2018-06-22 NOTE — Progress Notes (Signed)
PROGRESS NOTE    Matthew Rowe  IRJ:[id number] DOB: [birth year] DOA: 06/19/2018 PCP: [name], MD    Brief Narrative: Matthew Rowe. is a 57 y.o. male with medical history significant of coronary artery disease status post CABG and multiple PCI's, chronic angina, chronic diastolic congestive heart failure, type 2 diabetes, hypertension, CKD stage IV presenting to the hospital for further evaluation of chest pain. Patient went to [facility] for a pharmacological stress test today and developed severe chest pain after administration of regadenoson that did not improve with aminophylline or caffeine.  He was sent to the ED for further evaluation.  Patient reports having substernal, 10 out of 10 intensity, pressure-like chest pain and associated shortness of breath during the stress test.  States the doctor there told him that she noticed a blockage in his heart.  States he is currently chest pain-free.  ED Course: Vitals stable on arrival.  White count 11.9.  I-STAT troponin negative.  Chest x-ray showing no active cardiopulmonary disease.  EKG showing sinus tachycardia and left bundle branch block.  Patient was seen by cardiology.  TRH paged to admit.  Assessment & Plan:   Principal Problem:   Unstable angina (HCC) Active Problems:   Hypertension   Type 2 diabetes with nephropathy (HCC)   Gout   HLD (hyperlipidemia)   CKD (chronic kidney disease) stage 4, GFR 15-29 ml/min (HCC)   Chronic anemia   Chronic diastolic heart failure (HCC)   Leukocytosis   Chronic pain   Insomnia   Acute respiratory failure (HCC)   Acute diastolic (congestive) heart failure (HCC)   1-Acute Hypoxic Respiratory failure;  In setting of pulmonary edema, post hydration in anticipation for cath. Vs heart failure exacerbation.  Rule out PNA, started on Unasyn. Follow chest x ray.  Appreciate CCM evaluation.  Improved this am, now on 6 L oxygen. Patient is breathing better.  He was treated  with IV lasix 60 IV TID>  Repeated chest x ray for this am; progressive interstitial infiltrates on the right consistent with PNA. Continue with Unasyn and will add doxycycline to cover for atypical.  blood culture no growth to date.   Discussed with Dr  Joelyn Oms, plan to hold lasix starting tonight, in anticipation for cath/   2-Unable Angina; Develops chest pain during stress test.  Mild elevation of troponin.  On nitroglycerin Gtt.  He is chest pain free this am.  ECHO pending.  Plan for cath on Monday. Plan is for only 3 hours pre-hydration prior to cath, due to events with pulmonary edema./    3-CKD stage IV;  Nephrology consulted.  Stable on IV lasix.  Repeat renal function tomorrow.   4-DM type 2;  Change lantus to 10 units HS.  SSI.  5-Mild leukocytosis; PNA;  Follow trend. Improved.  On Unasyn and doxy.   6-Hypertension;  Continue with carvedilol.  Hold Norvasc to avoid hypotension.  Hold Imdur, patient will be started on nitroglycerin gtt. Marland Kitchen   7-Acute Chronic diastolic Heart failure;  Post fluid supplement.  On IV lasix.   8-HLD; continue with statins.   9-Gout;  Continue with allopurinol.   10-Chronic pain/neuropathy -Continue gabapentin  -Continue home tramadol  Insomnia -Continue home Ambienprn   Constipation;  Order Sorbitol. He decline stool softer at this time.    DVT prophylaxis: Heparin Gtt Code Status: Full code.  Family Communication: care discussed with wife who was at bedside.  Disposition Plan: remain in the hospital for treatment of  angina and respiratory failure. Cath tomorrow.   Consultants:  Cardiology Nephrology    Procedures:   Cath;    Antimicrobials:   none   Subjective: He is feeling better, breathing better. Happy that he is now on Heart healthy diet and not renal diet    Objective: Vitals:   06/21/18 2000 06/21/18 2119 06/21/18 2316 06/22/18 0510  BP:  (!) 141/72  137/78  Pulse: 96 97 98 99  Resp: 20  20 (!) 26 (!) 36  Temp:  98.8 F (37.1 C)  98.5 F (36.9 C)  TempSrc:  Oral  Oral  SpO2: 94% 93% 96% 93%  Weight:    104.4 kg  Height:        Intake/Output Summary (Last 24 hours) at 06/22/2018 1237 Last data filed at 06/22/2018 1047 Gross per 24 hour  Intake 422.01 ml  Output 3900 ml  Net -3477.99 ml   Filed Weights   06/20/18 0533 06/21/18 0612 06/22/18 0510  Weight: 106.3 kg 104.7 kg 104.4 kg    Examination:  General exam: Alert in no acute distress Respiratory system: bilateral crackles, improved Cardiovascular system: S 1, S 2 RRR Gastrointestinal system: BS present, soft, nt Central nervous system: non focal.  Extremities: Symmetric power.  Skin: no rash Psychiatry:  Mood and affect appropriate    Data Reviewed: I have personally reviewed following labs and imaging studies  CBC: Recent Labs  Lab 06/17/18 1934 06/19/18 1647 06/20/18 0449 06/21/18 0212 06/22/18 0410  WBC 10.7* 11.9* 11.7* 14.9* 11.3*  HGB 10.8* 11.0* 10.8* 11.3* 10.1*  HCT 34.8* 35.8* 33.8* 35.2* 31.3*  MCV 92.6 93.5 91.6 91.9 90.7  PLT 251 262 252 204 973   Basic Metabolic Panel: Recent Labs  Lab 06/17/18 1934 06/19/18 1647 06/20/18 0449 06/21/18 0212 06/22/18 0410  NA 136 137 140 137 136  K 4.1 4.7 4.6 4.1 4.1  CL 104 102 108 103 102  CO2 23 23 20* 23 22  GLUCOSE 186* 176* 146* 144* 144*  BUN 45* 51* 51* 51* 56*  CREATININE 3.89* 4.04* 4.05* 3.79* 4.52*  CALCIUM 9.1 9.3 9.0 9.1 8.9  PHOS  --   --   --  3.3 3.7   GFR: Estimated Creatinine Clearance: 19.8 mL/min (A) (by C-G formula based on SCr of 4.52 mg/dL (H)). Liver Function Tests: Recent Labs  Lab 06/21/18 0212 06/22/18 0410  ALBUMIN 3.4* 3.1*   No results for input(s): LIPASE, AMYLASE in the last 168 hours. No results for input(s): AMMONIA in the last 168 hours. Coagulation Profile: Recent Labs  Lab 06/17/18 1934  INR 0.96   Cardiac Enzymes: Recent Labs  Lab 06/20/18 0449 06/20/18 1036 06/20/18 1928  06/21/18 0212 06/21/18 0904  TROPONINI 0.15* 0.19* 0.22* 0.26* 0.24*   BNP (last 3 results) No results for input(s): PROBNP in the last 8760 hours. HbA1C: No results for input(s): HGBA1C in the last 72 hours. CBG: Recent Labs  Lab 06/21/18 1118 06/21/18 1615 06/21/18 2147 06/22/18 0749 06/22/18 1116  GLUCAP 117* 114* 157* 138* 151*   Lipid Profile: No results for input(s): CHOL, HDL, LDLCALC, TRIG, CHOLHDL, LDLDIRECT in the last 72 hours. Thyroid Function Tests: No results for input(s): TSH, T4TOTAL, FREET4, T3FREE, THYROIDAB in the last 72 hours. Anemia Panel: No results for input(s): VITAMINB12, FOLATE, FERRITIN, TIBC, IRON, RETICCTPCT in the last 72 hours. Sepsis Labs: Recent Labs  Lab 06/20/18 1928 06/21/18 0212 06/22/18 0410  PROCALCITON 0.10 0.28 0.59  LATICACIDVEN 2.6* 1.4  --  Recent Results (from the past 240 hour(s))  Culture, blood (routine x 2)     Status: None (Preliminary result)   Collection Time: 06/20/18  7:24 PM  Result Value Ref Range Status   Specimen Description BLOOD LEFT HAND  Final   Special Requests   Final    BOTTLES DRAWN AEROBIC ONLY Blood Culture adequate volume   Culture   Final    NO GROWTH 2 DAYS Performed at [facility], [address] [place], [place] [address]    Report Status PENDING  Incomplete  Culture, blood (routine x 2)     Status: None (Preliminary result)   Collection Time: 06/20/18  7:30 PM  Result Value Ref Range Status   Specimen Description BLOOD RIGHT HAND  Final   Special Requests   Final    BOTTLES DRAWN AEROBIC ONLY Blood Culture adequate volume   Culture   Final    NO GROWTH 2 DAYS Performed at [facility], [place] [address] [place], [place] [address]    Report Status PENDING  Incomplete         Radiology Studies: Dg Chest Port 1 View  Result Date: 06/22/2018 CLINICAL DATA:  Acute hypoxic respiratory failure. EXAM: PORTABLE CHEST 1 VIEW COMPARISON:  Chest x-rays dated 06/21/2018 and  06/20/2018. FINDINGS: Stable cardiomegaly. Improved aeration within the RIGHT lung, compatible with resolving edema or resolving pneumonia. No new lung abnormality. No pleural effusion or pneumothorax seen. IMPRESSION: Improved aeration of the RIGHT lung, suggesting improved fluid status and/or resolving pneumonia. Electronically Signed   By: Franki Cabot M.D.   On: 06/22/2018 10:38   Dg Chest Port 1 View  Result Date: 06/21/2018 CLINICAL DATA:  Acute respiratory failure. EXAM: PORTABLE CHEST 1 VIEW COMPARISON:  One-view chest x-ray 06/20/2018 FINDINGS: Heart is enlarged. Lung volumes are low. Progressive airspace consolidation is present in the right lung. Right greater than left interstitial disease is noted. IMPRESSION: 1. Progressive interstitial and airspace disease of the right lung consistent with pneumonia. Electronically Signed   By: San Morelle M.D.   On: 06/21/2018 10:18   Dg Chest Port 1 View  Result Date: 06/20/2018 CLINICAL DATA:  Chest pain. EXAM: PORTABLE CHEST 1 VIEW COMPARISON:  Portable film earlier in the day. FINDINGS: Cardiomegaly is redemonstrated. BILATERAL pulmonary opacities, worse on the RIGHT are redemonstrated, and slightly worse. No effusion or pneumothorax. IMPRESSION: Worsening aeration. Electronically Signed   By: Staci Righter M.D.   On: 06/20/2018 19:08   Dg Chest Port 1 View  Result Date: 06/20/2018 CLINICAL DATA:  Dyspnea EXAM: PORTABLE CHEST 1 VIEW COMPARISON:  06/19/2018 FINDINGS: Evolving pulmonary space opacities in the right upper, middle and lower lobes. Left lung is relatively clear. Heart size is top-normal. Median sternotomy sutures are place. Nonaneurysmal minimally atherosclerotic aorta. No acute osseous abnormality. IMPRESSION: Evolving patchy airspace opacities in the right lung suspicious for evolving multilobar pneumonia. Unilateral pulmonary edema is also a possibility depending on clinical presentation. Electronically Signed   By: Ashley Royalty M.D.   On: 06/20/2018 14:05        Scheduled Meds: . allopurinol  100 mg Oral Daily  . aspirin EC  81 mg Oral Daily  . atorvastatin  80 mg Oral Daily  . carvedilol  50 mg Oral BID WC  . cholecalciferol  2,000 Units Oral Daily  . clopidogrel  75 mg Oral Daily  . docusate sodium  100 mg Oral BID  . doxycycline  100 mg Oral Q12H  .  famotidine  20 mg Oral Daily  . furosemide  60 mg Intravenous Q8H  . gabapentin  300 mg Oral BID  . insulin aspart  0-9 Units Subcutaneous TID WC  . insulin glargine  10 Units Subcutaneous QHS  . multivitamin with minerals  1 tablet Oral Daily  . pantoprazole  40 mg Oral Daily  . ranolazine  500 mg Oral BID  . sodium chloride flush  3 mL Intravenous Q12H  . sorbitol  30 mL Oral Once  . vitamin E  400 Units Oral Daily   Continuous Infusions: . sodium chloride 500 mL (06/21/18 1544)  . sodium chloride    . ampicillin-sulbactam (UNASYN) IV 3 g (06/22/18 0551)  . heparin 1,100 Units/hr (06/22/18 0520)  . nitroGLYCERIN 30 mcg/min (06/21/18 1900)     LOS: 3 days    Time spent: 35 minutes    Elmarie Shiley, MD [facility] Pager [phone]  If 7PM-7AM, please contact night-coverage [email or web address] Password [facility] 06/22/2018, 12:37 PM

## 2018-06-23 ENCOUNTER — Encounter (HOSPITAL_COMMUNITY)
Admission: EM | Disposition: A | Discharge status: Home / Self Care | Payer: Self-pay | Source: Home / Self Care | Attending: Internal Medicine

## 2018-06-23 HISTORY — PX: RIGHT/LEFT HEART CATH AND CORONARY/GRAFT ANGIOGRAPHY: CATH118267

## 2018-06-23 LAB — HEPARIN LEVEL (UNFRACTIONATED)
HEPARIN UNFRACTIONATED: 0.37 [IU]/mL (ref 0.30–0.70)
Heparin Unfractionated: 0.25 [IU]/mL — ABNORMAL LOW (ref 0.30–0.70)

## 2018-06-23 LAB — POCT I-STAT 3, ART BLOOD GAS (G3+)
BICARBONATE: 24.4 mmol/L (ref 20.0–28.0)
O2 Saturation: 66 %
PO2 ART: 34 mmHg — AB (ref 83.0–108.0)
TCO2: 26 mmol/L (ref 22–32)
pCO2 arterial: 38.1 mmHg (ref 32.0–48.0)
pH, Arterial: 7.414 (ref 7.350–7.450)

## 2018-06-23 LAB — GLUCOSE, CAPILLARY
GLUCOSE-CAPILLARY: 118 mg/dL — AB (ref 70–99)
Glucose-Capillary: 128 mg/dL — ABNORMAL HIGH (ref 70–99)
Glucose-Capillary: 111 mg/dL — ABNORMAL HIGH (ref 70–99)
Glucose-Capillary: 118 mg/dL — ABNORMAL HIGH (ref 70–99)
Glucose-Capillary: 147 mg/dL — ABNORMAL HIGH (ref 70–99)

## 2018-06-23 LAB — RENAL FUNCTION PANEL
Albumin: 2.8 g/dL — ABNORMAL LOW (ref 3.5–5.0)
Anion gap: 11 (ref 5–15)
BUN: 57 mg/dL — ABNORMAL HIGH (ref 6–20)
CO2: 26 mmol/L (ref 22–32)
Calcium: 8.6 mg/dL — ABNORMAL LOW (ref 8.9–10.3)
Chloride: 97 mmol/L — ABNORMAL LOW (ref 98–111)
Creatinine, Ser: 4.91 mg/dL — ABNORMAL HIGH (ref 0.61–1.24)
GFR calc Af Amer: 14 mL/min — ABNORMAL LOW (ref >60)
GFR calc non Af Amer: 12 mL/min — ABNORMAL LOW (ref >60)
Glucose, Bld: 175 mg/dL — ABNORMAL HIGH (ref 70–99)
Phosphorus: 3.8 mg/dL (ref 2.5–4.6)
Potassium: 3.9 mmol/L (ref 3.5–5.1)
Sodium: 134 mmol/L — ABNORMAL LOW (ref 135–145)

## 2018-06-23 LAB — CBC
HCT: 29.8 % — ABNORMAL LOW (ref 39.0–52.0)
Hemoglobin: 9.7 g/dL — ABNORMAL LOW (ref 13.0–17.0)
MCH: 29.2 pg (ref 26.0–34.0)
MCHC: 32.6 g/dL (ref 30.0–36.0)
MCV: 89.8 fL (ref 80.0–100.0)
Platelets: 167 K/uL (ref 150–400)
RBC: 3.32 MIL/uL — ABNORMAL LOW (ref 4.22–5.81)
RDW: 14.1 % (ref 11.5–15.5)
WBC: 8.9 K/uL (ref 4.0–10.5)
nRBC: 0 % (ref 0.0–0.2)

## 2018-06-23 LAB — POCT I-STAT 3, VENOUS BLOOD GAS (G3P V)
Acid-base deficit: 2 mmol/L (ref 0.0–2.0)
Bicarbonate: 22 mmol/L (ref 20.0–28.0)
O2 SAT: 92 %
PCO2 VEN: 33.4 mmHg — AB (ref 44.0–60.0)
TCO2: 23 mmol/L (ref 22–32)
pH, Ven: 7.426 (ref 7.250–7.430)
pO2, Ven: 60 mmHg — ABNORMAL HIGH (ref 32.0–45.0)

## 2018-06-23 LAB — LEGIONELLA PNEUMOPHILA SEROGP 1 UR AG: L. pneumophila Serogp 1 Ur Ag: NEGATIVE

## 2018-06-23 LAB — POCT ACTIVATED CLOTTING TIME: ACTIVATED CLOTTING TIME: 158 s

## 2018-06-23 SURGERY — RIGHT/LEFT HEART CATH AND CORONARY/GRAFT ANGIOGRAPHY
Anesthesia: LOCAL

## 2018-06-23 MED ORDER — SODIUM CHLORIDE 0.9% FLUSH
3.0000 mL | Freq: Two times a day (BID) | INTRAVENOUS | Status: DC
  Administered 2018-06-23 – 2018-06-26 (×3): 3 mL via INTRAVENOUS

## 2018-06-23 MED ORDER — ACETAMINOPHEN 325 MG PO TABS: 650.0000 mg | ORAL_TABLET | ORAL | Status: DC | PRN

## 2018-06-23 MED ORDER — IOHEXOL 350 MG/ML SOLN
INTRAVENOUS | Status: DC | PRN
  Administered 2018-06-23: 105 mL via INTRA_ARTERIAL

## 2018-06-23 MED ORDER — SODIUM CHLORIDE 0.9 % IV SOLN: INTRAVENOUS | Status: DC

## 2018-06-23 MED ORDER — HEPARIN (PORCINE) IN NACL 1000-0.9 UT/500ML-% IV SOLN
INTRAVENOUS | Status: AC
  Filled 2018-06-23: qty 1000

## 2018-06-23 MED ORDER — HEPARIN (PORCINE) IN NACL 1000-0.9 UT/500ML-% IV SOLN
INTRAVENOUS | Status: DC | PRN
  Administered 2018-06-23 (×2): 500 mL

## 2018-06-23 MED ORDER — SODIUM CHLORIDE 0.9 % IV SOLN: 250.0000 mL | INTRAVENOUS | Status: DC | PRN

## 2018-06-23 MED ORDER — SODIUM CHLORIDE 0.9 % IV SOLN
INTRAVENOUS | Status: DC
  Administered 2018-06-23: 09:00:00 via INTRAVENOUS

## 2018-06-23 MED ORDER — PREDNISONE 20 MG PO TABS
20.0000 mg | ORAL_TABLET | Freq: Once | ORAL | Status: AC
  Administered 2018-06-23: 20 mg via ORAL
  Filled 2018-06-23: qty 1

## 2018-06-23 MED ORDER — HEPARIN (PORCINE) IN NACL 100-0.45 UNIT/ML-% IJ SOLN
1250.0000 [IU]/h | INTRAMUSCULAR | Status: DC
  Administered 2018-06-24 (×2): 1250 [IU]/h via INTRAVENOUS
  Filled 2018-06-23 (×2): qty 250

## 2018-06-23 MED ORDER — HEPARIN (PORCINE) IN NACL 100-0.45 UNIT/ML-% IJ SOLN: 1250.0000 [IU]/h | INTRAMUSCULAR | Status: DC

## 2018-06-23 MED ORDER — ONDANSETRON HCL 4 MG/2ML IJ SOLN: 4.0000 mg | Freq: Four times a day (QID) | INTRAMUSCULAR | Status: DC | PRN

## 2018-06-23 MED ORDER — LIDOCAINE HCL (PF) 1 % IJ SOLN
INTRAMUSCULAR | Status: DC | PRN
  Administered 2018-06-23: 20 mL via INTRADERMAL

## 2018-06-23 MED ORDER — ASPIRIN 81 MG PO CHEW
81.0000 mg | CHEWABLE_TABLET | Freq: Once | ORAL | Status: AC
  Administered 2018-06-23: 81 mg via ORAL
  Filled 2018-06-23: qty 1

## 2018-06-23 MED ORDER — SODIUM CHLORIDE 0.9% FLUSH: 3.0000 mL | INTRAVENOUS | Status: DC | PRN

## 2018-06-23 MED ORDER — LIDOCAINE HCL (PF) 1 % IJ SOLN
INTRAMUSCULAR | Status: AC
  Filled 2018-06-23: qty 30

## 2018-06-23 SURGICAL SUPPLY — 10 items
CATH INFINITI 5 FR IM (CATHETERS) ×2 IMPLANT
CATH INFINITI 5FR MULTPACK ANG (CATHETERS) ×2 IMPLANT
CATH LAUNCHER 5F EBU3.5 (CATHETERS) ×2 IMPLANT
CATH SWAN GANZ 7F STRAIGHT (CATHETERS) ×2 IMPLANT
KIT HEART LEFT (KITS) ×2 IMPLANT
PACK CARDIAC CATHETERIZATION (CUSTOM PROCEDURE TRAY) ×2 IMPLANT
SHEATH PINNACLE 5F 10CM (SHEATH) ×2 IMPLANT
SHEATH PINNACLE 7F 10CM (SHEATH) ×2 IMPLANT
TRANSDUCER W/STOPCOCK (MISCELLANEOUS) ×2 IMPLANT
WIRE EMERALD 3MM-J .035X150CM (WIRE) ×2 IMPLANT

## 2018-06-23 NOTE — Progress Notes (Signed)
Site area: rt groin fa and fv sheaths Site Prior to Removal:  Level 0 Pressure Applied For:  20 minutes Manual:   yes Patient Status During Pull:  stable Post Pull Site:  Level  0 Post Pull Instructions Given:  yes Post Pull Pulses Present: rt dp palpable Dressing Applied:  Gauze and tegaderm Bedrest begins @ 1630 Comments:

## 2018-06-23 NOTE — Interval H&P Note (Signed)
Cath Lab Visit (complete for each Cath Lab visit)  Clinical Evaluation Leading to the Procedure:   ACS: Yes.    Non-ACS:    Anginal Classification: CCS IV  Anti-ischemic medical therapy: Minimal Therapy (1 class of medications)  Non-Invasive Test Results: Intermediate-risk stress test findings: cardiac mortality 1-3%/year  Prior CABG: Previous CABG      History and Physical Interval Note:  06/23/2018 2:36 PM  Karie Kirks Sr.  has presented today for surgery, with the diagnosis of Chest Pain  The various methods of treatment have been discussed with the patient and family. After consideration of risks, benefits and other options for treatment, the patient has consented to  Procedure(s): RIGHT/LEFT HEART CATH AND CORONARY/GRAFT ANGIOGRAPHY (N/A) as a surgical intervention .  The patient's history has been reviewed, patient examined, no change in status, stable for surgery.  I have reviewed the patient's chart and labs.  Questions were answered to the patient's satisfaction.     Larae Grooms

## 2018-06-23 NOTE — Progress Notes (Signed)
Progress Note  Patient Name: Matthew DIEMER Sr. Date of Encounter: 06/23/2018  Primary Cardiologist: Nelva Bush, MD transitioning to Dr. Irish Lack   Patient Profile     57 y.o. male with history of known CAD, CKD stage 5 progressing towards renal transplant with fistula in place, hypertension, chronic angina who presents after having chest pain during a stress test, with report of stress test being significant abnormal.   Cath anticipated for today.  Subjective  No chest pain  No sob  Reviewed plan for fluids   Inpatient Medications    Scheduled Meds: . allopurinol  100 mg Oral Daily  . aspirin EC  81 mg Oral Daily  . atorvastatin  80 mg Oral Daily  . carvedilol  50 mg Oral BID WC  . cholecalciferol  2,000 Units Oral Daily  . clopidogrel  75 mg Oral Daily  . docusate sodium  100 mg Oral BID  . doxycycline  100 mg Oral Q12H  . famotidine  20 mg Oral Daily  . gabapentin  300 mg Oral BID  . insulin aspart  0-9 Units Subcutaneous TID WC  . insulin glargine  10 Units Subcutaneous QHS  . multivitamin with minerals  1 tablet Oral Daily  . pantoprazole  40 mg Oral Daily  . ranolazine  500 mg Oral BID  . sodium chloride flush  3 mL Intravenous Q12H  . sorbitol  30 mL Oral Once  . vitamin E  400 Units Oral Daily   Continuous Infusions: . sodium chloride 10 mL/hr at 06/23/18 1314  . sodium chloride    . ampicillin-sulbactam (UNASYN) IV 200 mL/hr at 06/23/18 1743  . [START ON 06/24/2018] heparin    . nitroGLYCERIN 30 mcg/min (06/23/18 0853)   PRN Meds: sodium chloride, acetaminophen, diphenhydrAMINE, guaiFENesin-dextromethorphan, HYDROmorphone (DILAUDID) injection, nitroGLYCERIN, ondansetron (ZOFRAN) IV, sodium chloride flush, traMADol, zolpidem   Vital Signs    Vitals:   06/23/18 1620 06/23/18 1625 06/23/18 1630 06/23/18 1635  BP: 129/64 126/64 130/66 122/64  Pulse: 82 83 83 82  Resp: (!) 24 (!) 26 (!) 29 (!) 29  Temp:      TempSrc:      SpO2: 93% 93% 92%  93%  Weight:      Height:        Intake/Output Summary (Last 24 hours) at 06/23/2018 1904 Last data filed at 06/23/2018 1743 Gross per 24 hour  Intake 1736.03 ml  Output 1425 ml  Net 311.03 ml   Filed Weights   06/21/18 0612 06/22/18 0510 06/23/18 0550  Weight: 104.7 kg 104.4 kg 102.8 kg    Telemetry    Sinus 90s- Personally Reviewed  ECG     20-Jun-2018  Sinus tachycardia Left axis deviation Septal infarct , age undetermined Possible Lateral infarct , age undetermined   Physical Exam  Well developed and nourished in no acute distress HENT normal Neck supple with JVP-flat Lungs: Clear CV: Regular rate and rhythm, no murmurs or gallops Abd-soft with active BS Extrem: No Clubbing cyanosis edema Skin-warm and dry A & Oriented  Grossly normal sensory and motor function   Labs    Chemistry Recent Labs  Lab 06/21/18 0212 06/22/18 0410 06/23/18 0051  NA 137 136 134*  K 4.1 4.1 3.9  CL 103 102 97*  CO2 23 22 26   GLUCOSE 144* 144* 175*  BUN 51* 56* 57*  CREATININE 3.79* 4.52* 4.91*  CALCIUM 9.1 8.9 8.6*  ALBUMIN 3.4* 3.1* 2.8*  GFRNONAA 16* 13* 12*  GFRAA 19*  15* 14*  ANIONGAP 11 12 11      Hematology Recent Labs  Lab 06/21/18 0212 06/22/18 0410 06/23/18 0643  WBC 14.9* 11.3* 8.9  RBC 3.83* 3.45* 3.32*  HGB 11.3* 10.1* 9.7*  HCT 35.2* 31.3* 29.8*  MCV 91.9 90.7 89.8  MCH 29.5 29.3 29.2  MCHC 32.1 32.3 32.6  RDW 14.3 14.3 14.1  PLT 204 194 167    Cardiac Enzymes Recent Labs  Lab 06/20/18 1036 06/20/18 1928 06/21/18 0212 06/21/18 0904  TROPONINI 0.19* 0.22* 0.26* 0.24*    Recent Labs  Lab 06/17/18 1949 06/19/18 1651  TROPIPOC 0.00 0.01     BNP Recent Labs  Lab 06/17/18 1934  BNP 181.8*     DDimer No results for input(s): DDIMER in the last 168 hours.   Radiology    Dg Chest Port 1 View  Result Date: 06/22/2018 CLINICAL DATA:  Acute hypoxic respiratory failure. EXAM: PORTABLE CHEST 1 VIEW COMPARISON:  Chest x-rays dated  06/21/2018 and 06/20/2018. FINDINGS: Stable cardiomegaly. Improved aeration within the RIGHT lung, compatible with resolving edema or resolving pneumonia. No new lung abnormality. No pleural effusion or pneumothorax seen. IMPRESSION: Improved aeration of the RIGHT lung, suggesting improved fluid status and/or resolving pneumonia. Electronically Signed   By: Franki Cabot M.D.   On: 06/22/2018 10:38    Cardiac Studies   Per Dr. Darnelle Bos notes Cath/PCI:  LHC (12/02/13): LMCA with 30% distal stenosis. LAD with multiple stents and 50% ostial stenosis followed by 20% diffuse in-stent restenosis proximally. There is diffuse 50% in-stent restenosis in the mid and distal segments. The distal LAD is occluded. LCx is small with 40-50% ostial stenosis. OM 2 has a 50% ostial stenosis. Ramus intermedius is a large vessel with 70-80% stenosis involving its superior branch. 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 There is diffuse 20% proximal disease. There is a 90% stenosis involving the distal vessel just beyond the anastomosis with the RIMA. 60% stenosis extending into the RPDA is also evident. RIMA to RCA is widely patent with aforementioned stenosis just beyond the distal anastomosis.  PCI (12/03/13): Successful PCI to the mid/distal RCA with overlapping Promus drug-eluting stents (2.25 x 24 and 2.25 x 16 mm).  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   Assessment & Plan    Unstable angina CABG Renal insufficiency  Congestive Heart Failure acute HFPEF  Anticipating coronary angiography today. We discussed the risk of dialysis again, we will monitor for urine output and revaluate in the evening.    Signed, Elouise Munroe, MD  06/23/2018, 8:00 AM  ADDENDUM: Cath today:  Mid LM to Dist LM lesion is 40% stenosed.  Ost LAD lesion is 70% stenosed.  Prox LAD lesion is 80% stenosed.  Dist LAD lesion is 100% stenosed.  Mid RCA to  Dist RCA lesion is 10% stenosed.  Dist RCA lesion is 50% stenosed.  Mid LAD lesion is 50% stenosed.  Prox LAD to Mid LAD lesion is 50% stenosed.  PA pressure 30/16 mm Hg; mean PA 22 mm Hg; PCWP 12/12 PCWP 12 mm Hg; Ao sat 92%, PA sat 66 %; CO 8.1 L/min; CI 3.8   Severe LAD disease.  It appears that the ostial LAD disease has worsened since the last cath and the mid LAD has also significantly worsened.  THe mid LAd may be the culprit for his presentation.    Will discuss with colleagues, plan for PCI.  Distal left  main trifurcation with significant ostial LAD disease.  Restart heparin 8 hours post sheath pull.  Watch renal function.  Post procedure hydration.  Will have to watch for fluid overload.   *Will follow up with Interventional colleagues and determine next best steps.

## 2018-06-23 NOTE — Progress Notes (Signed)
ANTICOAGULATION CONSULT NOTE - Follow Up Consult  Pharmacy Consult for Heparin  Indication: chest pain/ACS  Allergies  Allergen Reactions  . Cleocin [Clindamycin Hcl] Anaphylaxis and Swelling  . Glimepiride [Amaryl] Other (See Comments)    Elevates liver function   . Clarithromycin Itching and Other (See Comments)    "Biaxin" Eyes itch and burn  . Colchicine Other (See Comments)    Affected kidneys   . Ciprocin-Fluocin-Procin [Fluocinolone Acetonide] Rash    Patient Measurements: Height: 5\' 5"  (165.1 cm) Weight: 226 lb 9.6 oz (102.8 kg) IBW/kg (Calculated) : 61.5 Heparin Dosing Weight: 87 kg  Vital Signs: Temp: 98.5 F (36.9 C) (10/14 0539) Temp Source: Oral (10/14 0539) BP: 137/82 (10/14 0539) Pulse Rate: 86 (10/14 0539)  Labs: Recent Labs    06/20/18 1928  06/21/18 0212 06/21/18 0904 06/22/18 0410  06/22/18 1552 06/23/18 0051 06/23/18 0643 06/23/18 0922  HGB  --    < > 11.3*  --  10.1*  --   --   --  9.7*  --   HCT  --   --  35.2*  --  31.3*  --   --   --  29.8*  --   PLT  --   --  204  --  194  --   --   --  167  --   HEPARINUNFRC 0.51  --  0.39  --  0.21*   < > 0.43 0.25*  --  0.37  CREATININE  --   --  3.79*  --  4.52*  --   --  4.91*  --   --   TROPONINI 0.22*  --  0.26* 0.24*  --   --   --   --   --   --    < > = values in this interval not displayed.    Estimated Creatinine Clearance: 18.1 mL/min (A) (by C-G formula based on SCr of 4.91 mg/dL (H)).  Assessment:  57 yr old male on IV heparin for chest pain/ACS while awaiting cardiac cath likely Monday.   Heparin level within goal this AM at 0.37, no issues of bleeding noted.    Goal of Therapy:  Heparin level 0.3-0.7 units/ml Monitor platelets by anticoagulation protocol: Yes   Plan:  Continue current rate of heparin at 1250 units/hr  F/U heparin level, CBC F/U after cardiac cath   Rober Minion, PharmD., MS Clinical Pharmacist Pager:  [phone] Thank you for allowing pharmacy to be part  of this patients care team.

## 2018-06-23 NOTE — Progress Notes (Signed)
ANTICOAGULATION CONSULT NOTE - Follow Up Consult  Pharmacy Consult for Heparin  Indication: chest pain/ACS  Allergies  Allergen Reactions  . Cleocin [Clindamycin Hcl] Anaphylaxis and Swelling  . Glimepiride [Amaryl] Other (See Comments)    Elevates liver function   . Clarithromycin Itching and Other (See Comments)    "Biaxin" Eyes itch and burn  . Colchicine Other (See Comments)    Affected kidneys   . Ciprocin-Fluocin-Procin [Fluocinolone Acetonide] Rash    Patient Measurements: Height: 5\' 5"  (165.1 cm) Weight: 226 lb 9.6 oz (102.8 kg) IBW/kg (Calculated) : 61.5 Heparin Dosing Weight: 87 kg  Vital Signs: Temp: 98 F (36.7 C) (10/14 1410) Temp Source: Oral (10/14 0539) BP: 122/64 (10/14 1635) Pulse Rate: 82 (10/14 1635)  Labs: Recent Labs    06/20/18 1928  06/21/18 0212 06/21/18 0904 06/22/18 0410  06/22/18 1552 06/23/18 0051 06/23/18 0643 06/23/18 0922  HGB  --    < > 11.3*  --  10.1*  --   --   --  9.7*  --   HCT  --   --  35.2*  --  31.3*  --   --   --  29.8*  --   PLT  --   --  204  --  194  --   --   --  167  --   HEPARINUNFRC 0.51  --  0.39  --  0.21*   < > 0.43 0.25*  --  0.37  CREATININE  --   --  3.79*  --  4.52*  --   --  4.91*  --   --   TROPONINI 0.22*  --  0.26* 0.24*  --   --   --   --   --   --    < > = values in this interval not displayed.    Estimated Creatinine Clearance: 18.1 mL/min (A) (by C-G formula based on SCr of 4.91 mg/dL (H)).  Assessment:  57 yr old male on IV heparin for chest pain/ACS while awaiting cardiac cath likely Monday.   Heparin level within goal this AM at 0.37. Underwent cardiac cath finding significant LAD dx - plan for possible PCI in future. Consult to restart heparin infusion 8 hours after sheath removal (documented on 10/14@1605 ). No s/sx of bleeding per notes. Hgb 9.7, plt 167.     Goal of Therapy:  Heparin level 0.3-0.7 units/ml Monitor platelets by anticoagulation protocol: Yes   Plan:  Restart heparin  infusion at 1250 units/hr on 10/15@0000  Obtain 8 hour heparin level  F/U heparin level, CBC daily    Doylene Canard, PharmD Clinical Pharmacist  Pager: [phone] Phone: [phone] Thank you for allowing pharmacy to be part of this patients care team.

## 2018-06-23 NOTE — Progress Notes (Addendum)
PROGRESS NOTE    Matthew Rowe  BTD:[id number] DOB: July 31, 1961 DOA: 06/19/2018 PCP: [name], MD    Brief Narrative: Matthew Rowe. is a 57 y.o. male with medical history significant of coronary artery disease status post CABG and multiple PCI's, chronic angina, chronic diastolic congestive heart failure, type 2 diabetes, hypertension, CKD stage IV presenting to the hospital for further evaluation of chest pain. Patient went to [facility] for a pharmacological stress test today and developed severe chest pain after administration of regadenoson that did not improve with aminophylline or caffeine.  He was sent to the ED for further evaluation.  Patient reports having substernal, 10 out of 10 intensity, pressure-like chest pain and associated shortness of breath during the stress test.  States the doctor there told him that she noticed a blockage in his heart.  States he is currently chest pain-free.  ED Course: Vitals stable on arrival.  White count 11.9.  I-STAT troponin negative.  Chest x-ray showing no active cardiopulmonary disease.  EKG showing sinus tachycardia and left bundle branch block.  Patient was seen by cardiology.  TRH paged to admit.  Assessment & Plan:   Principal Problem:   Unstable angina (HCC) Active Problems:   Hypertension   Type 2 diabetes with nephropathy (HCC)   Gout   HLD (hyperlipidemia)   CKD (chronic kidney disease) stage 4, GFR 15-29 ml/min (HCC)   Chronic anemia   Chronic diastolic heart failure (HCC)   Leukocytosis   Chronic pain   Insomnia   Acute respiratory failure (HCC)   Acute diastolic (congestive) heart failure (HCC)   1-Acute Hypoxic Respiratory failure;  In setting of pulmonary edema, post hydration in anticipation for cath. Vs heart failure exacerbation.  Rule out PNA, started on Unasyn. Follow chest x ray.  Appreciate CCM evaluation.  Improved this am, now on 6 L oxygen. Patient is breathing better.  He was treated  with IV lasix 60 IV TID>  Repeated chest x ray for this am; progressive interstitial infiltrates on the right consistent with PNA. Continue with Unasyn and will add doxycycline to cover for atypical.  blood culture no growth to date.   Holding lasix pre cath.   2-Unable Angina; Develops chest pain during stress test.  Mild elevation of troponin.  On nitroglycerin Gtt.  He is chest pain free this am.  ECHO pending.  Plan for cath today.  Monitor closely for pulmonary edema pre cath.   3-CKD stage IV;  Nephrology consulted.  Holding lasix pre cath/  Repeat renal function tomorrow.  nephology recommend repeat renal function and resume lasix after  Discussed with Dr Deterdine, he recommend to stop IV fluids post cath to avoid further volume overload. He is aware of pressure post cath/ patient went into florid pulmonary edema 2 days ago after hydration in preparation for cath.  Discussed with cardiology regarding nephrology recommendations.   4-DM type 2;  Change lantus to 10 units HS.  SSI.  5-Mild leukocytosis; PNA;  Follow trend. Improved.  On Unasyn and doxy.   6-Hypertension;  Continue with carvedilol.  Hold Norvasc to avoid hypotension.  Hold Imdur, patient will be started on nitroglycerin gtt. Marland Kitchen   7-Acute Chronic diastolic Heart failure;  Post fluid supplement.  Holding lasix for cath   8-HLD; continue with statins.   9-Gout;  Continue with allopurinol.   10-Chronic pain/neuropathy -Continue gabapentin  -Continue home tramadol  Insomnia -Continue home Ambienprn   Constipation;  Had BM  DVT prophylaxis: Heparin Gtt Code Status: Full code.  Family Communication: care discussed with wife who was at bedside.  Disposition Plan: remain in the hospital for treatment of angina and respiratory failure. Cath today  Consultants:  Cardiology Nephrology    Procedures:   Cath;    Antimicrobials:   none   Subjective: He is feeling ok, denies  worsening dyspnea.   Objective: Vitals:   06/22/18 2319 06/23/18 0337 06/23/18 0539 06/23/18 0550  BP:   137/82   Pulse: 91 84 86   Resp: (!) 30 (!) 27 (!) 28   Temp:   98.5 F (36.9 C)   TempSrc:   Oral   SpO2: 99% 100% 98% 95%  Weight:    102.8 kg  Height:        Intake/Output Summary (Last 24 hours) at 06/23/2018 0752 Last data filed at 06/23/2018 0547 Gross per 24 hour  Intake 1203 ml  Output 2950 ml  Net -1747 ml   Filed Weights   06/21/18 0612 06/22/18 0510 06/23/18 0550  Weight: 104.7 kg 104.4 kg 102.8 kg    Examination:  General exam: NAD Respiratory system: crackles improved Cardiovascular system: S 1, S 2 RRR Gastrointestinal system: BS present , soft, nt Central nervous system: Non focal.  Extremities: Symmetric power.  Skin: no rash Psychiatry:  Mood and affect appropriate   Data Reviewed: I have personally reviewed following labs and imaging studies  CBC: Recent Labs  Lab 06/19/18 1647 06/20/18 0449 06/21/18 0212 06/22/18 0410 06/23/18 0643  WBC 11.9* 11.7* 14.9* 11.3* 8.9  HGB 11.0* 10.8* 11.3* 10.1* 9.7*  HCT 35.8* 33.8* 35.2* 31.3* 29.8*  MCV 93.5 91.6 91.9 90.7 89.8  PLT 262 252 204 194 992   Basic Metabolic Panel: Recent Labs  Lab 06/19/18 1647 06/20/18 0449 06/21/18 0212 06/22/18 0410 06/23/18 0051  NA 137 140 137 136 134*  K 4.7 4.6 4.1 4.1 3.9  CL 102 108 103 102 97*  CO2 23 20* 23 22 26   GLUCOSE 176* 146* 144* 144* 175*  BUN 51* 51* 51* 56* 57*  CREATININE 4.04* 4.05* 3.79* 4.52* 4.91*  CALCIUM 9.3 9.0 9.1 8.9 8.6*  PHOS  --   --  3.3 3.7 3.8   GFR: Estimated Creatinine Clearance: 18.1 mL/min (A) (by C-G formula based on SCr of 4.91 mg/dL (H)). Liver Function Tests: Recent Labs  Lab 06/21/18 0212 06/22/18 0410 06/23/18 0051  ALBUMIN 3.4* 3.1* 2.8*   No results for input(s): LIPASE, AMYLASE in the last 168 hours. No results for input(s): AMMONIA in the last 168 hours. Coagulation Profile: Recent Labs  Lab  06/17/18 1934  INR 0.96   Cardiac Enzymes: Recent Labs  Lab 06/20/18 0449 06/20/18 1036 06/20/18 1928 06/21/18 0212 06/21/18 0904  TROPONINI 0.15* 0.19* 0.22* 0.26* 0.24*   BNP (last 3 results) No results for input(s): PROBNP in the last 8760 hours. HbA1C: No results for input(s): HGBA1C in the last 72 hours. CBG: Recent Labs  Lab 06/21/18 2147 06/22/18 0749 06/22/18 1116 06/22/18 1559 06/22/18 2124  GLUCAP 157* 138* 151* 146* 203*   Lipid Profile: No results for input(s): CHOL, HDL, LDLCALC, TRIG, CHOLHDL, LDLDIRECT in the last 72 hours. Thyroid Function Tests: No results for input(s): TSH, T4TOTAL, FREET4, T3FREE, THYROIDAB in the last 72 hours. Anemia Panel: No results for input(s): VITAMINB12, FOLATE, FERRITIN, TIBC, IRON, RETICCTPCT in the last 72 hours. Sepsis Labs: Recent Labs  Lab 06/20/18 1928 06/21/18 0212 06/22/18 0410  PROCALCITON 0.10 0.28 0.59  LATICACIDVEN  2.6* 1.4  --     Recent Results (from the past 240 hour(s))  Culture, blood (routine x 2)     Status: None (Preliminary result)   Collection Time: 06/20/18  7:24 PM  Result Value Ref Range Status   Specimen Description BLOOD LEFT HAND  Final   Special Requests   Final    BOTTLES DRAWN AEROBIC ONLY Blood Culture adequate volume   Culture   Final    NO GROWTH 2 DAYS Performed at [facility], [place] [address] [place], [place] [address]    Report Status PENDING  Incomplete  Culture, blood (routine x 2)     Status: None (Preliminary result)   Collection Time: 06/20/18  7:30 PM  Result Value Ref Range Status   Specimen Description BLOOD RIGHT HAND  Final   Special Requests   Final    BOTTLES DRAWN AEROBIC ONLY Blood Culture adequate volume   Culture   Final    NO GROWTH 2 DAYS Performed at [facility], [place] [address], [place], [place] [address]    Report Status PENDING  Incomplete         Radiology Studies: Dg Chest Port 1 View  Result Date: 06/22/2018 CLINICAL DATA:   Acute hypoxic respiratory failure. EXAM: PORTABLE CHEST 1 VIEW COMPARISON:  Chest x-rays dated 06/21/2018 and 06/20/2018. FINDINGS: Stable cardiomegaly. Improved aeration within the RIGHT lung, compatible with resolving edema or resolving pneumonia. No new lung abnormality. No pleural effusion or pneumothorax seen. IMPRESSION: Improved aeration of the RIGHT lung, suggesting improved fluid status and/or resolving pneumonia. Electronically Signed   By: Franki Cabot M.D.   On: 06/22/2018 10:38   Dg Chest Port 1 View  Result Date: 06/21/2018 CLINICAL DATA:  Acute respiratory failure. EXAM: PORTABLE CHEST 1 VIEW COMPARISON:  One-view chest x-ray 06/20/2018 FINDINGS: Heart is enlarged. Lung volumes are low. Progressive airspace consolidation is present in the right lung. Right greater than left interstitial disease is noted. IMPRESSION: 1. Progressive interstitial and airspace disease of the right lung consistent with pneumonia. Electronically Signed   By: San Morelle M.D.   On: 06/21/2018 10:18        Scheduled Meds: . allopurinol  100 mg Oral Daily  . aspirin EC  81 mg Oral Daily  . atorvastatin  80 mg Oral Daily  . carvedilol  50 mg Oral BID WC  . cholecalciferol  2,000 Units Oral Daily  . clopidogrel  75 mg Oral Daily  . docusate sodium  100 mg Oral BID  . doxycycline  100 mg Oral Q12H  . famotidine  20 mg Oral Daily  . gabapentin  300 mg Oral BID  . insulin aspart  0-9 Units Subcutaneous TID WC  . insulin glargine  10 Units Subcutaneous QHS  . multivitamin with minerals  1 tablet Oral Daily  . pantoprazole  40 mg Oral Daily  . ranolazine  500 mg Oral BID  . sodium chloride flush  3 mL Intravenous Q12H  . sorbitol  30 mL Oral Once  . vitamin E  400 Units Oral Daily   Continuous Infusions: . sodium chloride 500 mL (06/21/18 1544)  . sodium chloride    . sodium chloride    . ampicillin-sulbactam (UNASYN) IV 3 g (06/23/18 0535)  . heparin 1,250 Units/hr (06/23/18 0128)  .  nitroGLYCERIN 30 mcg/min (06/22/18 2209)     LOS: 4 days    Time spent: 35 minutes    Elmarie Shiley, MD [facility] Pager [phone]  If 7PM-7AM, please contact night-coverage [email or web address] Password [email or web address] 06/23/2018, 7:52 AM

## 2018-06-23 NOTE — Plan of Care (Signed)
  Problem: Clinical Measurements: Goal: Diagnostic test results will improve Outcome: Progressing Goal: Respiratory complications will improve Outcome: Progressing   Problem: Activity: Goal: Risk for activity intolerance will decrease Outcome: Progressing   Problem: Education: Goal: Knowledge of General Education information will improve Description Including pain rating scale, medication(s)/side effects and non-pharmacologic comfort measures Outcome: Completed/Met   Problem: Nutrition: Goal: Adequate nutrition will be maintained Outcome: Completed/Met

## 2018-06-23 NOTE — Consult Note (Signed)
   [facility] CM Inpatient Consult   06/23/2018  [place] 24-May-1961 [id number]    Patient screened for potential [facility] Care Management services due to unplanned readmission risk score of 32% (extreme).  Chart reviewed. Noted patient does not have a [facility] Primary Care Provider. Verified with inpatient RNCM.     Marthenia Rolling, MSN-Ed, RN,BSN [facility] Liaison [phone]

## 2018-06-23 NOTE — Progress Notes (Signed)
ANTICOAGULATION CONSULT NOTE - Follow Up Consult  Pharmacy Consult for Heparin  Indication: chest pain/ACS  Allergies  Allergen Reactions  . Cleocin [Clindamycin Hcl] Anaphylaxis and Swelling  . Glimepiride [Amaryl] Other (See Comments)    Elevates liver function   . Clarithromycin Itching and Other (See Comments)    "Biaxin" Eyes itch and burn  . Colchicine Other (See Comments)    Affected kidneys   . Ciprocin-Fluocin-Procin [Fluocinolone Acetonide] Rash    Patient Measurements: Height: 5\' 5"  (165.1 cm) Weight: 230 lb 2.6 oz (104.4 kg) IBW/kg (Calculated) : 61.5 Heparin Dosing Weight: 87 kg  Vital Signs: Temp: 98.9 F (37.2 C) (10/13 2045) Temp Source: Oral (10/13 2045) BP: 127/70 (10/13 2045) Pulse Rate: 91 (10/13 2319)  Labs: Recent Labs    06/20/18 0449  06/20/18 1928 06/21/18 0212 06/21/18 0904 06/22/18 0410 06/22/18 1228 06/22/18 1552 06/23/18 0051  HGB 10.8*  --   --  11.3*  --  10.1*  --   --   --   HCT 33.8*  --   --  35.2*  --  31.3*  --   --   --   PLT 252  --   --  204  --  194  --   --   --   HEPARINUNFRC  --    < > 0.51 0.39  --  0.21* >2.20* 0.43 0.25*  CREATININE 4.05*  --   --  3.79*  --  4.52*  --   --   --   TROPONINI 0.15*   < > 0.22* 0.26* 0.24*  --   --   --   --    < > = values in this interval not displayed.    Estimated Creatinine Clearance: 19.8 mL/min (A) (by C-G formula based on SCr of 4.52 mg/dL (H)).  Assessment:  57 yr old male on IV heparin for chest pain/ACS while awaiting cardiac cath likely Monday.   Heparin level below goal this AM, no issues per RN.    Goal of Therapy:  Heparin level 0.3-0.7 units/ml Monitor platelets by anticoagulation protocol: Yes   Plan:  Inc heparin to 1250 units/hr  Heparin level in 8 hours   Narda Bonds, PharmD, [place] Pharmacist Phone: [phone]

## 2018-06-23 NOTE — Progress Notes (Signed)
Subjective: Interval History: has complaints concern over fluid he is getting.  Objective: Vital signs in last 24 hours: Temp:  [98.4 F (36.9 C)-98.9 F (37.2 C)] 98.5 F (36.9 C) (10/14 0539) Pulse Rate:  [81-94] 81 (10/14 0917) Resp:  [26-94] 26 (10/14 0917) BP: (124-137)/(70-98) 124/76 (10/14 0917) SpO2:  [94 %-100 %] 95 % (10/14 0917) Weight:  [102.8 kg] 102.8 kg (10/14 0550) Weight change: -1.615 kg  Intake/Output from previous day: 10/13 0701 - 10/14 0700 In: 1651.3 [P.O.:1200; I.V.:251.3; IV Piggyback:200] Out: 2950 [Urine:2950] Intake/Output this shift: Total I/O In: 0  Out: 425 [Urine:425]  General appearance: alert, cooperative and moderately obese Resp: diminished breath sounds bilaterally and rales bibasilar Cardio: S1, S2 normal and systolic murmur: systolic ejection 2/6, crescendo and decrescendo at 2nd left intercostal space GI: obese, pos bs, soft. liver down 5 cm Extremities: edema 2+, AVF LUA BVT  Lab Results: Recent Labs    06/22/18 0410 06/23/18 0643  WBC 11.3* 8.9  HGB 10.1* 9.7*  HCT 31.3* 29.8*  PLT 194 167   BMET:  Recent Labs    06/22/18 0410 06/23/18 0051  NA 136 134*  K 4.1 3.9  CL 102 97*  CO2 22 26  GLUCOSE 144* 175*  BUN 56* 57*  CREATININE 4.52* 4.91*  CALCIUM 8.9 8.6*   No results for input(s): PTH in the last 72 hours. Iron Studies: No results for input(s): IRON, TIBC, TRANSFERRIN, FERRITIN in the last 72 hours.  Studies/Results: Dg Chest Port 1 View  Result Date: 06/22/2018 CLINICAL DATA:  Acute hypoxic respiratory failure. EXAM: PORTABLE CHEST 1 VIEW COMPARISON:  Chest x-rays dated 06/21/2018 and 06/20/2018. FINDINGS: Stable cardiomegaly. Improved aeration within the RIGHT lung, compatible with resolving edema or resolving pneumonia. No new lung abnormality. No pleural effusion or pneumothorax seen. IMPRESSION: Improved aeration of the RIGHT lung, suggesting improved fluid status and/or resolving pneumonia.  Electronically Signed   By: Franki Cabot M.D.   On: 06/22/2018 10:38    I have reviewed the patient's current medications.  Assessment/Plan: 1 CKD 4-5 vol xs mild-mod. For cath 2 CAD/angina for cath, follow renal function and restart diuretics post 3 Anemia lower follow 4 DM controlled 5 HPTH check 6 Obesity 7 OSA P  Cath, follow Cr, diurese    LOS: 4 days   Matthew Rowe 06/23/2018,12:21 PM

## 2018-06-24 ENCOUNTER — Encounter (HOSPITAL_COMMUNITY): Payer: Self-pay | Admitting: Interventional Cardiology

## 2018-06-24 LAB — CBC
HCT: 32 % — ABNORMAL LOW (ref 39.0–52.0)
Hemoglobin: 10.3 g/dL — ABNORMAL LOW (ref 13.0–17.0)
MCH: 28.9 pg (ref 26.0–34.0)
MCHC: 32.2 g/dL (ref 30.0–36.0)
MCV: 89.9 fL (ref 80.0–100.0)
NRBC: 0 % (ref 0.0–0.2)
PLATELETS: 186 10*3/uL (ref 150–400)
RBC: 3.56 MIL/uL — ABNORMAL LOW (ref 4.22–5.81)
RDW: 13.9 % (ref 11.5–15.5)
WBC: 6.7 10*3/uL (ref 4.0–10.5)

## 2018-06-24 LAB — GLUCOSE, CAPILLARY
GLUCOSE-CAPILLARY: 207 mg/dL — AB (ref 70–99)
Glucose-Capillary: 263 mg/dL — ABNORMAL HIGH (ref 70–99)
Glucose-Capillary: 182 mg/dL — ABNORMAL HIGH (ref 70–99)
Glucose-Capillary: 236 mg/dL — ABNORMAL HIGH (ref 70–99)

## 2018-06-24 LAB — URIC ACID: URIC ACID, SERUM: 7.7 mg/dL (ref 3.7–8.6)

## 2018-06-24 LAB — RENAL FUNCTION PANEL
ALBUMIN: 2.9 g/dL — AB (ref 3.5–5.0)
Anion gap: 15 (ref 5–15)
BUN: 59 mg/dL — ABNORMAL HIGH (ref 6–20)
CALCIUM: 9.1 mg/dL (ref 8.9–10.3)
CO2: 21 mmol/L — ABNORMAL LOW (ref 22–32)
Chloride: 101 mmol/L (ref 98–111)
Creatinine, Ser: 4.76 mg/dL — ABNORMAL HIGH (ref 0.61–1.24)
GFR calc non Af Amer: 12 mL/min — ABNORMAL LOW (ref >60)
GFR, EST AFRICAN AMERICAN: 14 mL/min — AB (ref >60)
Glucose, Bld: 204 mg/dL — ABNORMAL HIGH (ref 70–99)
PHOSPHORUS: 4.6 mg/dL (ref 2.5–4.6)
Potassium: 4.6 mmol/L (ref 3.5–5.1)
SODIUM: 137 mmol/L (ref 135–145)

## 2018-06-24 LAB — HEPARIN LEVEL (UNFRACTIONATED): HEPARIN UNFRACTIONATED: 0.5 [IU]/mL (ref 0.30–0.70)

## 2018-06-24 MED ORDER — SODIUM CHLORIDE 0.9% FLUSH
3.0000 mL | Freq: Two times a day (BID) | INTRAVENOUS | Status: DC
  Administered 2018-06-25 – 2018-06-26 (×3): 3 mL via INTRAVENOUS

## 2018-06-24 MED ORDER — SODIUM CHLORIDE 0.9% FLUSH: 3.0000 mL | INTRAVENOUS | Status: DC | PRN

## 2018-06-24 MED ORDER — SODIUM CHLORIDE 0.9 % IV SOLN: INTRAVENOUS | Status: DC

## 2018-06-24 MED ORDER — SODIUM CHLORIDE 0.9 % IV SOLN: 250.0000 mL | INTRAVENOUS | Status: DC | PRN

## 2018-06-24 MED ORDER — SODIUM CHLORIDE 0.9 % IV SOLN
INTRAVENOUS | Status: DC
  Administered 2018-06-25: 09:00:00 via INTRAVENOUS

## 2018-06-24 MED ORDER — ASPIRIN 81 MG PO CHEW
81.0000 mg | CHEWABLE_TABLET | ORAL | Status: AC
  Administered 2018-06-25: 81 mg via ORAL
  Filled 2018-06-24: qty 1

## 2018-06-24 NOTE — Progress Notes (Addendum)
Progress Note  Patient Name: Matthew VOLKOV Sr. Date of Encounter: 06/24/2018  Primary Cardiologist: Nelva Bush, MD transitioning to Dr. Irish Lack   Patient Profile     57 y.o. male with history of known CAD, CKD stage 5 progressing towards renal transplant with fistula in place, hypertension, chronic angina who presents after having chest pain during a stress test, with report of stress test being significant abnormal.   Cath below, PCI in am  Subjective   Sleeping, appropriate when roused. No CP or SOB  Inpatient Medications    Scheduled Meds: . allopurinol  100 mg Oral Daily  . aspirin EC  81 mg Oral Daily  . atorvastatin  80 mg Oral Daily  . carvedilol  50 mg Oral BID WC  . cholecalciferol  2,000 Units Oral Daily  . clopidogrel  75 mg Oral Daily  . docusate sodium  100 mg Oral BID  . doxycycline  100 mg Oral Q12H  . famotidine  20 mg Oral Daily  . gabapentin  300 mg Oral BID  . insulin aspart  0-9 Units Subcutaneous TID WC  . insulin glargine  10 Units Subcutaneous QHS  . multivitamin with minerals  1 tablet Oral Daily  . pantoprazole  40 mg Oral Daily  . ranolazine  500 mg Oral BID  . sodium chloride flush  3 mL Intravenous Q12H  . sorbitol  30 mL Oral Once  . vitamin E  400 Units Oral Daily   Continuous Infusions: . sodium chloride 10 mL/hr at 06/23/18 1314  . sodium chloride    . ampicillin-sulbactam (UNASYN) IV 200 mL/hr at 06/24/18 0537  . heparin 1,250 Units/hr (06/24/18 0537)  . nitroGLYCERIN 30 mcg/min (06/24/18 0537)   PRN Meds: sodium chloride, acetaminophen, diphenhydrAMINE, guaiFENesin-dextromethorphan, HYDROmorphone (DILAUDID) injection, nitroGLYCERIN, ondansetron (ZOFRAN) IV, sodium chloride flush, traMADol, zolpidem   Vital Signs    Vitals:   06/23/18 1635 06/23/18 2100 06/24/18 0022 06/24/18 0439  BP: 122/64 128/77  128/82  Pulse: 82 87 86 84  Resp: (!) 29 18 (!) 26 (!) 24  Temp:  98.6 F (37 C)  99.1 F (37.3 C)  TempSrc:   Oral  Axillary  SpO2: 93% 93% 95% 100%  Weight:    102.6 kg  Height:        Intake/Output Summary (Last 24 hours) at 06/24/2018 1115 Last data filed at 06/24/2018 0940 Gross per 24 hour  Intake 1846.56 ml  Output 925 ml  Net 921.56 ml   Filed Weights   06/22/18 0510 06/23/18 0550 06/24/18 0439  Weight: 104.4 kg 102.8 kg 102.6 kg    Telemetry    SR, no sig ectopy- Personally Reviewed  ECG     20-Jun-2018  Sinus tachycardia Left axis deviation Septal infarct , age undetermined Possible Lateral infarct , age undetermined   Physical Exam   General: Well developed, well nourished, male in no acute distress Head: Eyes PERRLA, No xanthomas.   Normocephalic and atraumatic Lungs: rales bases bilaterally to auscultation. Heart: RRR S1 S2, without MRG.  Pulses are 2+ & equal. No JVD seen, difficult to assess 2nd body habitus Abdomen: Bowel sounds are present, abdomen soft and non-tender without masses or  hernias noted. Msk: Normal strength and tone for age. Extremities: No clubbing, cyanosis or edema.    Skin:  No rashes or lesions noted. Neuro: Alert and oriented X 3. Psych:  Good affect, responds appropriately  Labs    Chemistry Recent Labs  Lab 06/22/18 0410 06/23/18 0051  06/24/18 0421  NA 136 134* 137  K 4.1 3.9 4.6  CL 102 97* 101  CO2 22 26 21*  GLUCOSE 144* 175* 204*  BUN 56* 57* 59*  CREATININE 4.52* 4.91* 4.76*  CALCIUM 8.9 8.6* 9.1  ALBUMIN 3.1* 2.8* 2.9*  GFRNONAA 13* 12* 12*  GFRAA 15* 14* 14*  ANIONGAP 12 11 15      Hematology Recent Labs  Lab 06/22/18 0410 06/23/18 0643 06/24/18 0421  WBC 11.3* 8.9 6.7  RBC 3.45* 3.32* 3.56*  HGB 10.1* 9.7* 10.3*  HCT 31.3* 29.8* 32.0*  MCV 90.7 89.8 89.9  MCH 29.3 29.2 28.9  MCHC 32.3 32.6 32.2  RDW 14.3 14.1 13.9  PLT 194 167 186    Cardiac Enzymes Recent Labs  Lab 06/20/18 1036 06/20/18 1928 06/21/18 0212 06/21/18 0904  TROPONINI 0.19* 0.22* 0.26* 0.24*    Recent Labs  Lab  06/17/18 1949 06/19/18 1651  TROPIPOC 0.00 0.01     BNP Recent Labs  Lab 06/17/18 1934  BNP 181.8*      Radiology    Dg Chest 2 View  Result Date: 06/19/2018 CLINICAL DATA:  Chest pain EXAM: CHEST - 2 VIEW COMPARISON:  06/17/2018 FINDINGS: Prior median sternotomy. Heart is normal size. No confluent airspace opacities or effusions. IMPRESSION: No active cardiopulmonary disease. Electronically Signed   By: Rolm Baptise M.D.   On: 06/19/2018 17:38   Dg Chest 2 View  Result Date: 06/17/2018 CLINICAL DATA:  Chest pain. EXAM: CHEST - 2 VIEW COMPARISON:  Radiograph of October 16, 2017. FINDINGS: Stable cardiomediastinal silhouette. Sternotomy wires are noted. No pneumothorax or pleural effusion is noted. Both lungs are clear. The visualized skeletal structures are unremarkable. IMPRESSION: No active cardiopulmonary disease. Electronically Signed   By: Marijo Conception, M.D.   On: 06/17/2018 20:27   Dg Chest Port 1 View  Result Date: 06/22/2018 CLINICAL DATA:  Acute hypoxic respiratory failure. EXAM: PORTABLE CHEST 1 VIEW COMPARISON:  Chest x-rays dated 06/21/2018 and 06/20/2018. FINDINGS: Stable cardiomegaly. Improved aeration within the RIGHT lung, compatible with resolving edema or resolving pneumonia. No new lung abnormality. No pleural effusion or pneumothorax seen. IMPRESSION: Improved aeration of the RIGHT lung, suggesting improved fluid status and/or resolving pneumonia. Electronically Signed   By: Franki Cabot M.D.   On: 06/22/2018 10:38   Dg Chest Port 1 View  Result Date: 06/21/2018 CLINICAL DATA:  Acute respiratory failure. EXAM: PORTABLE CHEST 1 VIEW COMPARISON:  One-view chest x-ray 06/20/2018 FINDINGS: Heart is enlarged. Lung volumes are low. Progressive airspace consolidation is present in the right lung. Right greater than left interstitial disease is noted. IMPRESSION: 1. Progressive interstitial and airspace disease of the right lung consistent with pneumonia.  Electronically Signed   By: San Morelle M.D.   On: 06/21/2018 10:18   Dg Chest Port 1 View  Result Date: 06/20/2018 CLINICAL DATA:  Chest pain. EXAM: PORTABLE CHEST 1 VIEW COMPARISON:  Portable film earlier in the day. FINDINGS: Cardiomegaly is redemonstrated. BILATERAL pulmonary opacities, worse on the RIGHT are redemonstrated, and slightly worse. No effusion or pneumothorax. IMPRESSION: Worsening aeration. Electronically Signed   By: Staci Righter M.D.   On: 06/20/2018 19:08   Dg Chest Port 1 View  Result Date: 06/20/2018 CLINICAL DATA:  Dyspnea EXAM: PORTABLE CHEST 1 VIEW COMPARISON:  06/19/2018 FINDINGS: Evolving pulmonary space opacities in the right upper, middle and lower lobes. Left lung is relatively clear. Heart size is top-normal. Median sternotomy sutures are place. Nonaneurysmal minimally atherosclerotic aorta. No acute osseous  abnormality. IMPRESSION: Evolving patchy airspace opacities in the right lung suspicious for evolving multilobar pneumonia. Unilateral pulmonary edema is also a possibility depending on clinical presentation. Electronically Signed   By: Ashley Royalty M.D.   On: 06/20/2018 14:05     Cardiac Studies   CATH: 06/23/2018  Mid LM to Dist LM lesion is 40% stenosed.  Ost LAD lesion is 70% stenosed.  Prox LAD lesion is 80% stenosed.  Dist LAD lesion is 100% stenosed.  Mid RCA to Dist RCA lesion is 10% stenosed.  Dist RCA lesion is 50% stenosed.  Mid LAD lesion is 50% stenosed.  Prox LAD to Mid LAD lesion is 50% stenosed.  PA pressure 30/16 mm Hg; mean PA 22 mm Hg; PCWP 12/12 PCWP 12 mm Hg; Ao sat 92%, PA sat 66 %; CO 8.1 L/min; CI 3.8   Severe LAD disease.  It appears that the ostial LAD disease has worsened since the last cath and the mid LAD has also significantly worsened.  THe mid LAd may be the culprit for his presentation.    Will discuss with colleagues, plan for PCI.  Distal left main trifurcation with significant ostial LAD  disease.  Restart heparin 8 hours post sheath pull.  Watch renal function.  Post procedure hydration.  Will have to watch for fluid overload.  Diagnostic Diagram          Per Dr. Darnelle Bos notes Cath/PCI:  LHC (12/02/13): LMCA with 30% distal stenosis. LAD with multiple stents and 50% ostial stenosis followed by 20% diffuse in-stent restenosis proximally. There is diffuse 50% in-stent restenosis in the mid and distal segments. The distal LAD is occluded. LCx is small with 40-50% ostial stenosis. OM 2 has a 50% ostial stenosis. Ramus intermedius is a large vessel with 70-80% stenosis involving its superior branch. 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 There is diffuse 20% proximal disease. There is a 90% stenosis involving the distal vessel just beyond the anastomosis with the RIMA. 60% stenosis extending into the RPDA is also evident. RIMA to RCA is widely patent with aforementioned stenosis just beyond the distal anastomosis.  PCI (12/03/13): Successful PCI to the mid/distal RCA with overlapping Promus drug-eluting stents (2.25 x 24 and 2.25 x 16 mm).  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  Assessment & Plan    Unstable angina - for PCI in am, put on board, orders written - MD to review hydration, orders written per Dr. Detterding's recommendations  CABG - continue aggressive CRF reduction - no lipid profile in system, check in am - continue high-dose statin - BP well-controlled - A1c was 7.0 05/2018  Renal insufficiency  - continue to follow renal function daily - not on ACE/ARB - Renal function stable after cath.   Congestive Heart Failure acute HFPEF - was initially on Lasix 60 mg IV BID - d/c'd  10/13 - pta on Lasix 40 mg bid - wt is stable volume status ok (still w/ some rales but not much) - hold off on further diuresis till after cath unless pt develops worsening SOB -Spoke with Dr. Esau Grew.   He recommends holding off on hydration today, start normal saline at 50 cc an hour tomorrow morning precath.  Orders written, nursing aware    Signed, Rosaria Ferries, PA-C  06/24/2018, 8:00 AM ------------------------------------   History and all data above reviewed.  Patient examined.  I agree with the findings as above.  Mliss Fritz  Jamie Kato Sr. is doing well, no acute concerns.   Gen: Well developed and nourished in no acute distress HENT normal Neck supple with JVP-flat Lungs: Clear CV: Regular rate and rhythm, no murmurs or gallops Abd-soft with active BS Extrem: No Clubbing cyanosis edema Skin-warm and dry Neuro: A & Oriented  Grossly normal sensory and motor function . All available labs, radiology testing, previous records reviewed. Agree with documented assessment and plan of my colleague as stated above with the following additions or changes:  Principal Problem:   Unstable angina ([place]) Active Problems:   Hypertension   Type 2 diabetes with nephropathy (HCC)   Gout   HLD (hyperlipidemia)   CKD (chronic kidney disease) stage 4, GFR 15-29 ml/min (HCC)   Chronic anemia   Chronic diastolic heart failure (HCC)   Leukocytosis   Chronic pain   Insomnia   Acute respiratory failure (HCC)   Acute diastolic (congestive) heart failure ([place])    Plan: Agree with cautious hydration for renal dysfunction prior to cath in the AM for complex intervention for CAD/unstable angina. Renal function was stable after cath yesterday and he continued to make urine.  Elouise Munroe, MD [facility] 4:32 PM  06/24/2018

## 2018-06-24 NOTE — Progress Notes (Signed)
PROGRESS NOTE    Matthew Rowe  CZY:[id number] DOB: 1961-05-24 DOA: 06/19/2018 PCP: [name], MD    Brief Narrative: Matthew Rowe. is a 57 y.o. male with medical history significant of coronary artery disease status post CABG and multiple PCI's, chronic angina, chronic diastolic congestive heart failure, type 2 diabetes, hypertension, CKD stage IV presenting to the hospital for further evaluation of chest pain. Patient went to [facility] for a pharmacological stress test today and developed severe chest pain after administration of regadenoson that did not improve with aminophylline or caffeine.  He was sent to the ED for further evaluation.  Patient reports having substernal, 10 out of 10 intensity, pressure-like chest pain and associated shortness of breath during the stress test.  States the doctor there told him that she noticed a blockage in his heart.  States he is currently chest pain-free.  ED Course: Vitals stable on arrival.  White count 11.9.  I-STAT troponin negative.  Chest x-ray showing no active cardiopulmonary disease.  EKG showing sinus tachycardia and left bundle branch block.  Patient was seen by cardiology.  TRH paged to admit.  Patient admitted with unstable angina. He was started on heparin Gtt and subsequently started on nitroglycerin gtt. He develops acute hypoxic respiratory failure after hydration in preparation for cath. He received IV lasix and was placed on BIPAP. Due to asymmetric pulmonary edema he was also started on IV antibiotics.   Acute hypoxic respiratory failure is stable. Lasix is on hold post cath. He underwent cath which showed severe LAD. Plan for PCI tomorrow.     Assessment & Plan:   Principal Problem:   Unstable angina (HCC) Active Problems:   Hypertension   Type 2 diabetes with nephropathy (HCC)   Gout   HLD (hyperlipidemia)   CKD (chronic kidney disease) stage 4, GFR 15-29 ml/min (HCC)   Chronic anemia   Chronic  diastolic heart failure (HCC)   Leukocytosis   Chronic pain   Insomnia   Acute respiratory failure (HCC)   Acute diastolic (congestive) heart failure (HCC)   1-Acute Hypoxic Respiratory failure;  In setting of pulmonary edema, post hydration in anticipation for cath. Vs heart failure exacerbation.  Rule out PNA, started on Unasyn.  Appreciate CCM evaluation.  He is now on 3 L oxygen.  He was treated with IV lasix 60 IV TID>  Repeated chest x ray for this am; progressive interstitial infiltrates on the right consistent with PNA. Continue with Unasyn and  doxycycline to cover for atypical.  blood culture no growth to date.   Holding lasix pre cath.  IV fluids order by cardiology pre cath. I spoke with Faythe Dingwall, she will speak with Dr Deterdine regarding IV fluids pre cath.   2-Unable Angina; Develops chest pain during stress test.  Mild elevation of troponin.  On nitroglycerin Gtt.  ECHO Left ventricle: The cavity size was normal. The estimated ejection fraction was in the range of 55% to 60%. Hypokinesis of the apical inferior, mid-apical inferoseptal, and mid-apical anteroseptal myocardium. Apical akinesis. underwent cath; which showed;  which showed severe LAD. Plan for PCI tomorrow.    3-CKD stage IV;  Nephrology consulted.  Holding lasix pre cath/  Repeat renal function tomorrow.  nephology recommend repeat renal function and resume lasix after    4-DM type 2;  Continue with lantus to 10 units HS.  SSI.  5-Mild leukocytosis; PNA;  Follow trend. Improved.  On Unasyn and doxy. Day 5/5.   6-Hypertension;  Continue with carvedilol.  Hold Norvasc to avoid hypotension.  Hold Imdur, patient will be started on nitroglycerin gtt. Marland Kitchen   7-Acute Chronic diastolic Heart failure;  Holding lasix for cath   8-HLD; continue with statins.   9-Gout;  Continue with allopurinol.  Received a dose of prednisone 10-14  10-Chronic pain/neuropathy -Continue gabapentin  -Continue home  tramadol  Insomnia -Continue home Ambienprn   Constipation;  Had BM    DVT prophylaxis: Heparin Gtt Code Status: Full code.  Family Communication: care discussed with wife who was at bedside.  Disposition Plan: remain in the hospital for treatment of angina and respiratory failure. Cath tomorrow  Consultants:  Cardiology Nephrology    Procedures:   Cath;    Antimicrobials:   none   Subjective: He report chest discomfort when he takes a deep breath. He feels he needs to speak slow   Objective: Vitals:   06/23/18 1635 06/23/18 2100 06/24/18 0022 06/24/18 0439  BP: 122/64 128/77  128/82  Pulse: 82 87 86 84  Resp: (!) 29 18 (!) 26 (!) 24  Temp:  98.6 F (37 C)  99.1 F (37.3 C)  TempSrc:  Oral  Axillary  SpO2: 93% 93% 95% 100%  Weight:    102.6 kg  Height:        Intake/Output Summary (Last 24 hours) at 06/24/2018 0924 Last data filed at 06/24/2018 0700 Gross per 24 hour  Intake 1486.56 ml  Output 1350 ml  Net 136.56 ml   Filed Weights   06/22/18 0510 06/23/18 0550 06/24/18 0439  Weight: 104.4 kg 102.8 kg 102.6 kg    Examination:  General exam: NAD Respiratory system: mild crackles bases.  Cardiovascular system: S 1, S 2 RRR Gastrointestinal system: BS present, soft, nt Central nervous system: non focal.  Extremities: Symmetric power.  Skin: No rashes Psychiatry:  Mood and affect appropriate    Data Reviewed: I have personally reviewed following labs and imaging studies  CBC: Recent Labs  Lab 06/20/18 0449 06/21/18 0212 06/22/18 0410 06/23/18 0643 06/24/18 0421  WBC 11.7* 14.9* 11.3* 8.9 6.7  HGB 10.8* 11.3* 10.1* 9.7* 10.3*  HCT 33.8* 35.2* 31.3* 29.8* 32.0*  MCV 91.6 91.9 90.7 89.8 89.9  PLT 252 204 194 167 144   Basic Metabolic Panel: Recent Labs  Lab 06/20/18 0449 06/21/18 0212 06/22/18 0410 06/23/18 0051 06/24/18 0421  NA 140 137 136 134* 137  K 4.6 4.1 4.1 3.9 4.6  CL 108 103 102 97* 101  CO2 20* 23 22 26  21*    GLUCOSE 146* 144* 144* 175* 204*  BUN 51* 51* 56* 57* 59*  CREATININE 4.05* 3.79* 4.52* 4.91* 4.76*  CALCIUM 9.0 9.1 8.9 8.6* 9.1  PHOS  --  3.3 3.7 3.8 4.6   GFR: Estimated Creatinine Clearance: 18.6 mL/min (A) (by C-G formula based on SCr of 4.76 mg/dL (H)). Liver Function Tests: Recent Labs  Lab 06/21/18 0212 06/22/18 0410 06/23/18 0051 06/24/18 0421  ALBUMIN 3.4* 3.1* 2.8* 2.9*   No results for input(s): LIPASE, AMYLASE in the last 168 hours. No results for input(s): AMMONIA in the last 168 hours. Coagulation Profile: Recent Labs  Lab 06/17/18 1934  INR 0.96   Cardiac Enzymes: Recent Labs  Lab 06/20/18 0449 06/20/18 1036 06/20/18 1928 06/21/18 0212 06/21/18 0904  TROPONINI 0.15* 0.19* 0.22* 0.26* 0.24*   BNP (last 3 results) No results for input(s): PROBNP in the last 8760 hours. HbA1C: No results for input(s): HGBA1C in the last 72 hours. CBG: Recent  Labs  Lab 06/23/18 1122 06/23/18 1609 06/23/18 1722 06/23/18 2046 06/24/18 0731  GLUCAP 111* 118* 118* 147* 263*   Lipid Profile: No results for input(s): CHOL, HDL, LDLCALC, TRIG, CHOLHDL, LDLDIRECT in the last 72 hours. Thyroid Function Tests: No results for input(s): TSH, T4TOTAL, FREET4, T3FREE, THYROIDAB in the last 72 hours. Anemia Panel: No results for input(s): VITAMINB12, FOLATE, FERRITIN, TIBC, IRON, RETICCTPCT in the last 72 hours. Sepsis Labs: Recent Labs  Lab 06/20/18 1928 06/21/18 0212 06/22/18 0410  PROCALCITON 0.10 0.28 0.59  LATICACIDVEN 2.6* 1.4  --     Recent Results (from the past 240 hour(s))  Culture, blood (routine x 2)     Status: None (Preliminary result)   Collection Time: 06/20/18  7:24 PM  Result Value Ref Range Status   Specimen Description BLOOD LEFT HAND  Final   Special Requests   Final    BOTTLES DRAWN AEROBIC ONLY Blood Culture adequate volume   Culture   Final    NO GROWTH 2 DAYS Performed at [facility], [place] [address] [place], [place] [address]     Report Status PENDING  Incomplete  Culture, blood (routine x 2)     Status: None (Preliminary result)   Collection Time: 06/20/18  7:30 PM  Result Value Ref Range Status   Specimen Description BLOOD RIGHT HAND  Final   Special Requests   Final    BOTTLES DRAWN AEROBIC ONLY Blood Culture adequate volume   Culture   Final    NO GROWTH 2 DAYS Performed at [facility], [place] [address], [place], [place] [address]    Report Status PENDING  Incomplete         Radiology Studies: No results found.      Scheduled Meds: . allopurinol  100 mg Oral Daily  . aspirin EC  81 mg Oral Daily  . atorvastatin  80 mg Oral Daily  . carvedilol  50 mg Oral BID WC  . cholecalciferol  2,000 Units Oral Daily  . clopidogrel  75 mg Oral Daily  . docusate sodium  100 mg Oral BID  . doxycycline  100 mg Oral Q12H  . famotidine  20 mg Oral Daily  . gabapentin  300 mg Oral BID  . insulin aspart  0-9 Units Subcutaneous TID WC  . insulin glargine  10 Units Subcutaneous QHS  . multivitamin with minerals  1 tablet Oral Daily  . pantoprazole  40 mg Oral Daily  . ranolazine  500 mg Oral BID  . sodium chloride flush  3 mL Intravenous Q12H  . sorbitol  30 mL Oral Once  . vitamin E  400 Units Oral Daily   Continuous Infusions: . sodium chloride 10 mL/hr at 06/23/18 1314  . sodium chloride    . ampicillin-sulbactam (UNASYN) IV 200 mL/hr at 06/24/18 0537  . heparin 1,250 Units/hr (06/24/18 0537)  . nitroGLYCERIN 30 mcg/min (06/24/18 0537)     LOS: 5 days    Time spent: 35 minutes    Elmarie Shiley, MD [facility] Pager [phone]  If 7PM-7AM, please contact night-coverage [email or web address] Password [email or web address] 06/24/2018, 9:24 AM

## 2018-06-24 NOTE — Progress Notes (Signed)
Subjective: Interval History: has no complaint, discussed imp of diet.  Objective: Vital signs in last 24 hours: Temp:  [98 F (36.7 C)-99.1 F (37.3 C)] 99.1 F (37.3 C) (10/15 0439) Pulse Rate:  [81-94] 84 (10/15 0439) Resp:  [12-30] 24 (10/15 0439) BP: (122-145)/(64-82) 128/82 (10/15 0439) SpO2:  [82 %-100 %] 100 % (10/15 0439) FiO2 (%):  [40 %] 40 % (10/15 0439) Weight:  [102.6 kg] 102.6 kg (10/15 0439) Weight change: -0.227 kg  Intake/Output from previous day: 10/14 0701 - 10/15 0700 In: 1486.6 [P.O.:600; I.V.:769.4; IV Piggyback:117.2] Out: 1350 [Urine:1350] Intake/Output this shift: No intake/output data recorded.  General appearance: alert, cooperative, no distress and moderately obese Resp: rales bibasilar Cardio: S1, S2 normal and systolic murmur: systolic ejection 2/6, crescendo and decrescendo at 2nd left intercostal space GI: obese, pos bs, soft Extremities: edema 1-2+  Lab Results: Recent Labs    06/23/18 0643 06/24/18 0421  WBC 8.9 6.7  HGB 9.7* 10.3*  HCT 29.8* 32.0*  PLT 167 186   BMET:  Recent Labs    06/23/18 0051 06/24/18 0421  NA 134* 137  K 3.9 4.6  CL 97* 101  CO2 26 21*  GLUCOSE 175* 204*  BUN 57* 59*  CREATININE 4.91* 4.76*  CALCIUM 8.6* 9.1   No results for input(s): PTH in the last 72 hours. Iron Studies: No results for input(s): IRON, TIBC, TRANSFERRIN, FERRITIN in the last 72 hours.  Studies/Results: No results found.  I have reviewed the patient's current medications.  Assessment/Plan: 1 CKD 4-5  Vol xs mild.  Avoid xs.  Keep even until PCI, then neg.Cr stable but bicarb lower, will follow closely for contrast Nx. 2 AVF not mature 3 Anemia esa/Fe 4 HPTH check 5 DM controlled 6 Obesity 7 CAD per cards P Diet, keep even, follow chem , check PTH, cont esa.  LOS: 5 days   Matthew Rowe 06/24/2018,9:27 AM

## 2018-06-24 NOTE — Progress Notes (Signed)
RT placed patient on BIPAP. Patient is tolerating well at this time. RT will monitor as needed. 

## 2018-06-24 NOTE — Plan of Care (Signed)
  Problem: Clinical Measurements: Goal: Ability to maintain clinical measurements within normal limits will improve Outcome: Progressing   Problem: Education: Goal: Knowledge of General Education information will improve Description Including pain rating scale, medication(s)/side effects and non-pharmacologic comfort measures Outcome: Completed/Met   Problem: Coping: Goal: Level of anxiety will decrease Outcome: Completed/Met

## 2018-06-24 NOTE — Progress Notes (Signed)
ANTICOAGULATION CONSULT NOTE - Follow Up Consult  Pharmacy Consult for Heparin  Indication: chest pain/ACS  Allergies  Allergen Reactions  . Cleocin [Clindamycin Hcl] Anaphylaxis and Swelling  . Glimepiride [Amaryl] Other (See Comments)    Elevates liver function   . Clarithromycin Itching and Other (See Comments)    "Biaxin" Eyes itch and burn  . Colchicine Other (See Comments)    Affected kidneys   . Ciprocin-Fluocin-Procin [Fluocinolone Acetonide] Rash    Patient Measurements: Height: 5\' 5"  (165.1 cm) Weight: 226 lb 1.6 oz (102.6 kg) IBW/kg (Calculated) : 61.5 Heparin Dosing Weight: 87 kg  Vital Signs: Temp: 99.1 F (37.3 C) (10/15 0439) Temp Source: Axillary (10/15 0439) BP: 128/82 (10/15 0439) Pulse Rate: 84 (10/15 0439)  Labs: Recent Labs    06/22/18 0410  06/23/18 0051 06/23/18 0643 06/23/18 0922 06/24/18 0421 06/24/18 0749  HGB 10.1*  --   --  9.7*  --  10.3*  --   HCT 31.3*  --   --  29.8*  --  32.0*  --   PLT 194  --   --  167  --  186  --   HEPARINUNFRC 0.21*   < > 0.25*  --  0.37  --  0.50  CREATININE 4.52*  --  4.91*  --   --  4.76*  --    < > = values in this interval not displayed.    Estimated Creatinine Clearance: 18.6 mL/min (A) (by C-G formula based on SCr of 4.76 mg/dL (H)).  Assessment:  57 yr old male on IV heparin for CAD. S/p cath 10/14 which shoed severe LAD dz and plan for future PCI. Heparin restarted post-cath. Heparin level therapeutic (0.37) on gtt at 1250 units/hr. Hgb down to 9.7, plt down to 167. No bleeding noted.  Goal of Therapy:  Heparin level 0.3-0.7 units/ml Monitor platelets by anticoagulation protocol: Yes   Plan:  Continue heparin infusion at 1250 units/hr  F/u daily heparin level and CBC F/u PCI plans   Sherlon Handing, PharmD, BCPS Clinical pharmacist  **Pharmacist phone directory can now be found on [email or web address] ([email or web address]).  Listed under [place]. Thank you for allowing pharmacy to be part of this patients care  team. 06/24/2018 10:24 AM

## 2018-06-25 ENCOUNTER — Ambulatory Visit (HOSPITAL_COMMUNITY): Admission: RE | Admit: 2018-06-25 | Payer: Medicare HMO | Source: Ambulatory Visit | Admitting: Cardiovascular Disease

## 2018-06-25 ENCOUNTER — Encounter (HOSPITAL_COMMUNITY)
Admission: EM | Disposition: A | Discharge status: Home / Self Care | Payer: Self-pay | Source: Home / Self Care | Attending: Internal Medicine

## 2018-06-25 DIAGNOSIS — G4700 Insomnia, unspecified: Secondary | ICD-10-CM

## 2018-06-25 DIAGNOSIS — Z9861 Coronary angioplasty status: Secondary | ICD-10-CM

## 2018-06-25 DIAGNOSIS — D72829 Elevated white blood cell count, unspecified: Secondary | ICD-10-CM

## 2018-06-25 DIAGNOSIS — I5032 Chronic diastolic (congestive) heart failure: Secondary | ICD-10-CM

## 2018-06-25 DIAGNOSIS — I25110 Atherosclerotic heart disease of native coronary artery with unstable angina pectoris: Secondary | ICD-10-CM

## 2018-06-25 DIAGNOSIS — I2510 Atherosclerotic heart disease of native coronary artery without angina pectoris: Secondary | ICD-10-CM

## 2018-06-25 DIAGNOSIS — D649 Anemia, unspecified: Secondary | ICD-10-CM

## 2018-06-25 DIAGNOSIS — E1121 Type 2 diabetes mellitus with diabetic nephropathy: Secondary | ICD-10-CM

## 2018-06-25 LAB — RENAL FUNCTION PANEL
ALBUMIN: 2.7 g/dL — AB (ref 3.5–5.0)
Anion gap: 10 (ref 5–15)
BUN: 62 mg/dL — AB (ref 6–20)
CO2: 24 mmol/L (ref 22–32)
CREATININE: 4.64 mg/dL — AB (ref 0.61–1.24)
Calcium: 8.6 mg/dL — ABNORMAL LOW (ref 8.9–10.3)
Chloride: 102 mmol/L (ref 98–111)
GFR calc Af Amer: 15 mL/min — ABNORMAL LOW (ref >60)
GFR calc non Af Amer: 13 mL/min — ABNORMAL LOW (ref >60)
GLUCOSE: 131 mg/dL — AB (ref 70–99)
POTASSIUM: 4 mmol/L (ref 3.5–5.1)
Phosphorus: 4.6 mg/dL (ref 2.5–4.6)
Sodium: 136 mmol/L (ref 135–145)

## 2018-06-25 LAB — HEPATIC FUNCTION PANEL
ALBUMIN: 2.7 g/dL — AB (ref 3.5–5.0)
ALT: 20 U/L (ref 0–44)
AST: 27 U/L (ref 15–41)
Alkaline Phosphatase: 55 U/L (ref 38–126)
Bilirubin, Direct: 0.1 mg/dL (ref 0.0–0.2)
Indirect Bilirubin: 0.6 mg/dL (ref 0.3–0.9)
TOTAL PROTEIN: 6.3 g/dL — AB (ref 6.5–8.1)
Total Bilirubin: 0.7 mg/dL (ref 0.3–1.2)

## 2018-06-25 LAB — CULTURE, BLOOD (ROUTINE X 2)
Culture: NO GROWTH
Culture: NO GROWTH
SPECIAL REQUESTS: ADEQUATE
Special Requests: ADEQUATE

## 2018-06-25 LAB — IRON AND TIBC
IRON: 108 ug/dL (ref 45–182)
SATURATION RATIOS: 54 % — AB (ref 17.9–39.5)
TIBC: 199 ug/dL — AB (ref 250–450)
UIBC: 91 ug/dL

## 2018-06-25 LAB — GLUCOSE, CAPILLARY
GLUCOSE-CAPILLARY: 116 mg/dL — AB (ref 70–99)
Glucose-Capillary: 123 mg/dL — ABNORMAL HIGH (ref 70–99)
Glucose-Capillary: 131 mg/dL — ABNORMAL HIGH (ref 70–99)
Glucose-Capillary: 125 mg/dL — ABNORMAL HIGH (ref 70–99)

## 2018-06-25 LAB — BASIC METABOLIC PANEL
ANION GAP: 11 (ref 5–15)
BUN: 62 mg/dL — ABNORMAL HIGH (ref 6–20)
CHLORIDE: 102 mmol/L (ref 98–111)
CO2: 24 mmol/L (ref 22–32)
Calcium: 8.6 mg/dL — ABNORMAL LOW (ref 8.9–10.3)
Creatinine, Ser: 4.69 mg/dL — ABNORMAL HIGH (ref 0.61–1.24)
GFR calc non Af Amer: 13 mL/min — ABNORMAL LOW (ref >60)
GFR, EST AFRICAN AMERICAN: 14 mL/min — AB (ref >60)
Glucose, Bld: 133 mg/dL — ABNORMAL HIGH (ref 70–99)
POTASSIUM: 4.1 mmol/L (ref 3.5–5.1)
SODIUM: 137 mmol/L (ref 135–145)

## 2018-06-25 LAB — LIPID PANEL
Cholesterol: 128 mg/dL (ref 0–200)
HDL: 35 mg/dL — ABNORMAL LOW (ref >40)
LDL Cholesterol: 73 mg/dL (ref 0–99)
Total CHOL/HDL Ratio: 3.7 RATIO
Triglycerides: 101 mg/dL (ref <150)
VLDL: 20 mg/dL (ref 0–40)

## 2018-06-25 LAB — CBC
HCT: 27.8 % — ABNORMAL LOW (ref 39.0–52.0)
Hemoglobin: 8.8 g/dL — ABNORMAL LOW (ref 13.0–17.0)
MCH: 28.6 pg (ref 26.0–34.0)
MCHC: 31.7 g/dL (ref 30.0–36.0)
MCV: 90.3 fL (ref 80.0–100.0)
NRBC: 0 % (ref 0.0–0.2)
PLATELETS: 189 10*3/uL (ref 150–400)
RBC: 3.08 MIL/uL — ABNORMAL LOW (ref 4.22–5.81)
RDW: 14 % (ref 11.5–15.5)
WBC: 8.5 10*3/uL (ref 4.0–10.5)

## 2018-06-25 LAB — HEPARIN LEVEL (UNFRACTIONATED): HEPARIN UNFRACTIONATED: 0.63 [IU]/mL (ref 0.30–0.70)

## 2018-06-25 LAB — PARATHYROID HORMONE, INTACT (NO CA): PTH: 111 pg/mL — ABNORMAL HIGH (ref 15–65)

## 2018-06-25 SURGERY — CORONARY STENT INTERVENTION
Anesthesia: LOCAL

## 2018-06-25 MED ORDER — RANOLAZINE ER 500 MG PO TB12
1000.0000 mg | ORAL_TABLET | Freq: Two times a day (BID) | ORAL | Status: DC
  Administered 2018-06-25 – 2018-06-26 (×2): 1000 mg via ORAL
  Filled 2018-06-25 (×2): qty 2

## 2018-06-25 MED ORDER — LORATADINE 10 MG PO TABS
10.0000 mg | ORAL_TABLET | Freq: Every day | ORAL | Status: DC | PRN
  Administered 2018-06-25: 10 mg via ORAL
  Filled 2018-06-25: qty 1

## 2018-06-25 MED ORDER — FUROSEMIDE 40 MG PO TABS
40.0000 mg | ORAL_TABLET | Freq: Two times a day (BID) | ORAL | Status: DC
  Administered 2018-06-25 – 2018-06-26 (×2): 40 mg via ORAL
  Filled 2018-06-25 (×2): qty 1

## 2018-06-25 NOTE — Progress Notes (Signed)
Subjective: Interval History: has complaints anxious to get this over with.  Objective: Vital signs in last 24 hours: Temp:  [97.1 F (36.2 C)-98 F (36.7 C)] 98 F (36.7 C) (10/16 0425) Pulse Rate:  [79-84] 79 (10/16 0425) Resp:  [18-23] 20 (10/16 0425) BP: (132-143)/(78-81) 143/79 (10/16 0425) SpO2:  [95 %-99 %] 95 % (10/16 0425) Weight:  [105.3 kg] 105.3 kg (10/16 0425) Weight change: 2.722 kg  Intake/Output from previous day: 10/15 0701 - 10/16 0700 In: 2059.7 [P.O.:1200; I.V.:671.2; IV Piggyback:188.6] Out: 1800 [Urine:1800] Intake/Output this shift: Total I/O In: 360 [P.O.:360] Out: -   General appearance: alert, cooperative, no distress and moderately obese Resp: diminished breath sounds bilaterally Cardio: S1, S2 normal and systolic murmur: systolic ejection 2/6, crescendo and decrescendo at 2nd left intercostal space GI: obese, nontender Extremities: edema 1+ and AVF  1st stage BVT LUA  Lab Results: Recent Labs    06/24/18 0421 06/25/18 0326  WBC 6.7 8.5  HGB 10.3* 8.8*  HCT 32.0* 27.8*  PLT 186 189   BMET:  Recent Labs    06/24/18 0421 06/25/18 0326  NA 137 137  136  K 4.6 4.1  4.0  CL 101 102  102  CO2 21* 24  24  GLUCOSE 204* 133*  131*  BUN 59* 62*  62*  CREATININE 4.76* 4.69*  4.64*  CALCIUM 9.1 8.6*  8.6*   Recent Labs    06/24/18 0421  PTH 111*   Iron Studies: No results for input(s): IRON, TIBC, TRANSFERRIN, FERRITIN in the last 72 hours.  Studies/Results: No results found.  I have reviewed the patient's current medications.  Assessment/Plan: 1 CKD4 stable. Acid base ok. Nonoliguric . For PCI 2 CAD per cards 3 anemia check Fe 4 HPTH vit D 5 Obesity 6 DM P ivf pre cath, PCI, follow chem , vol, check Fe     LOS: 6 days   Matthew Rowe 06/25/2018,9:44 AM

## 2018-06-25 NOTE — Progress Notes (Signed)
PROGRESS NOTE  Matthew Rowe  EQA:[id number] DOB: Nov 26, 1960 DOA: 06/19/2018 PCP: [name], MD   Brief Narrative: Matthew Rowe.is a 57 y.o.malewith a history of CAD s/p CABG and multiple PCI with chronic angina, chronic HFpEF, T2DM, HTN, and stage IV CKD who presented with chest pain at the advice of MD at [facility] during/following stress testing. Test was preoperative workup for potential renal transplantation. Chest pain described as 10/10 substernal chest pressure with dyspnea started with stress agent administration. On arrival he was chest pain free with negative troponin and ECG showing sinus tachycardia with LBBB. CXR negative and WBC 11.9k. He was admitted with cardiology consultation for unstable angina, started on heparin gtt and subsequently nitroglycerin gtt. IV fluids were given for CKD prior to catheterization but patient developed acute pulmonary edema requiring IV lasix and BiPAP, delaying LHC. Antibiotics also started for asymmetric appearance of edema. Once stable, he underwent cath which showed severe LAD disease for which PCI was planned for 10/16.   Assessment & Plan: Principal Problem:   Unstable angina (HCC) Active Problems:   Hypertension   Type 2 diabetes with nephropathy (HCC)   CAD- S/P multiple PCIs, CABG   Gout   HLD (hyperlipidemia)   CKD (chronic kidney disease) stage 4, GFR 15-29 ml/min (HCC)   Chronic anemia   Chronic diastolic heart failure (HCC)   Leukocytosis   Chronic pain   Insomnia   Acute respiratory failure (HCC)   Acute diastolic (congestive) heart failure (HCC)   Coronary artery disease involving native heart with unstable angina pectoris (HCC)  Acute hypoxic respiratory failure: Due to pulmonary edema in setting of UA and severe CKD with IVF's. Also possibly component of pneumonia.  - Initially requiring BiPAP, but has been stable off this, remains hypoxic.  - Restart diuretic as soon as reasonable, defer to  cardiology/nephrology as below - Continue empiric antibiotics with atypical coverage (right sided interstitial-appearing infiltrates), will complete 7 days Tx 10/17. No fever, leukocytosis has resolved.   - Monitor blood cultures (neg)  CAD with unstable angina: With wall motion abnormalities on echo (Hypokinesis of the apical inferior, mid-apical inferoseptal, and mid-apical anteroseptal myocardium. Apical akinesis.) and severe LAD disease on LHC.  - PCI planned per cardiology - Continue medical management, currently on heparin and NTG gtt's.   Stage IV CKD: Nonoliguric. Planning to continue work up for renal transplantation. - Nephrology consulted, recommended gentle hydration around PCI.  - Trend BMP, especially x48 hrs post contrast.  T2DM:  - Continue lantus 10u qHS + SSI.   HTN:  - Continue coreg, held norvasc and imdur to avoid hypotension in setting of NTG gtt.   Acute on chronic HFpEF:  - Manage BP as above.  - Restart lasix when able.   Hyperlipidemia: Chronic, stable - Continue statin  Gout: Chronic, stable.  - Continue allopurinol. Received prednisone 10/14.  Peripheral neuropathy:  - Continue gabapentin  Pain related to left AVF:  - Continue home tramadol  Constipation: Resolved.  - Monitor  Insomnia:  - Continue home ambien prn  DVT prophylaxis: Heparin gtt Code Status: Full Family Communication: Wife at bedside Disposition Plan: Home once cardiac evaluation and treatment completed and hypoxic respiratory failure improved. Continues to have a new oxygen requirement with tenuous fluid balance.   Consultants:   Cardiology  Nephrology  Procedures:   PCI 10/16  Antimicrobials:  Unasyn 10/11 >>   Doxycycline 10/12 >>   Subjective: No current chest pain this morning, no dyspnea  but has not been getting up much to test it. No palpitations. Swelling is improved in legs.   Objective: Vitals:   06/24/18 1700 06/24/18 2107 06/25/18 0425 06/25/18  1437  BP:  140/78 (!) 143/79 139/78  Pulse:  79 79 88  Resp: (!) 23 (!) 22 20 18   Temp:  (!) 97.1 F (36.2 C) 98 F (36.7 C) 97.9 F (36.6 C)  TempSrc:  Oral Axillary Axillary  SpO2:  99% 95% 96%  Weight:   105.3 kg   Height:        Intake/Output Summary (Last 24 hours) at 06/25/2018 1453 Last data filed at 06/25/2018 0916 Gross per 24 hour  Intake 1819.72 ml  Output 1470 ml  Net 349.72 ml   Filed Weights   06/23/18 0550 06/24/18 0439 06/25/18 0425  Weight: 102.8 kg 102.6 kg 105.3 kg    Gen: 57 y.o. male in no distress Pulm: Non-labored but tachypneic with crackles at bases.  CV: Regular rate and rhythm. Soft SEM, no rub, or gallop. No JVD, 1+ pedal edema. GI: Abdomen soft, non-tender, non-distended, with normoactive bowel sounds. No organomegaly or masses felt. Ext: Warm, no deformities. Left AC fossa with +thrill and well apposed wound edges on AVF. Skin: Otherwise no rashes, lesions or ulcers Neuro: Alert and oriented. No focal neurological deficits. Psych: Judgement and insight appear normal. Mood & affect appropriate.   Data Reviewed: I have personally reviewed following labs and imaging studies  CBC: Recent Labs  Lab 06/21/18 0212 06/22/18 0410 06/23/18 0643 06/24/18 0421 06/25/18 0326  WBC 14.9* 11.3* 8.9 6.7 8.5  HGB 11.3* 10.1* 9.7* 10.3* 8.8*  HCT 35.2* 31.3* 29.8* 32.0* 27.8*  MCV 91.9 90.7 89.8 89.9 90.3  PLT 204 194 167 186 518   Basic Metabolic Panel: Recent Labs  Lab 06/21/18 0212 06/22/18 0410 06/23/18 0051 06/24/18 0421 06/25/18 0326  NA 137 136 134* 137 137  136  K 4.1 4.1 3.9 4.6 4.1  4.0  CL 103 102 97* 101 102  102  CO2 23 22 26  21* 24  24  GLUCOSE 144* 144* 175* 204* 133*  131*  BUN 51* 56* 57* 59* 62*  62*  CREATININE 3.79* 4.52* 4.91* 4.76* 4.69*  4.64*  CALCIUM 9.1 8.9 8.6* 9.1 8.6*  8.6*  PHOS 3.3 3.7 3.8 4.6 4.6   GFR: Estimated Creatinine Clearance: 19.4 mL/min (A) (by C-G formula based on SCr of 4.64 mg/dL  (H)). Liver Function Tests: Recent Labs  Lab 06/21/18 0212 06/22/18 0410 06/23/18 0051 06/24/18 0421 06/25/18 0326  AST  --   --   --   --  27  ALT  --   --   --   --  20  ALKPHOS  --   --   --   --  55  BILITOT  --   --   --   --  0.7  PROT  --   --   --   --  6.3*  ALBUMIN 3.4* 3.1* 2.8* 2.9* 2.7*  2.7*   No results for input(s): LIPASE, AMYLASE in the last 168 hours. No results for input(s): AMMONIA in the last 168 hours. Coagulation Profile: No results for input(s): INR, PROTIME in the last 168 hours. Cardiac Enzymes: Recent Labs  Lab 06/20/18 0449 06/20/18 1036 06/20/18 1928 06/21/18 0212 06/21/18 0904  TROPONINI 0.15* 0.19* 0.22* 0.26* 0.24*   BNP (last 3 results) No results for input(s): PROBNP in the last 8760 hours. HbA1C: No results for input(s):  HGBA1C in the last 72 hours. CBG: Recent Labs  Lab 06/24/18 1123 06/24/18 1655 06/24/18 2109 06/25/18 0723 06/25/18 1057  GLUCAP 207* 182* 236* 116* 123*   Lipid Profile: Recent Labs    06/25/18 0326  CHOL 128  HDL 35*  LDLCALC 73  TRIG 101  CHOLHDL 3.7   Thyroid Function Tests: No results for input(s): TSH, T4TOTAL, FREET4, T3FREE, THYROIDAB in the last 72 hours. Anemia Panel: Recent Labs    06/25/18 0941  TIBC 199*  IRON 108   Urine analysis:    Component Value Date/Time   COLORURINE STRAW (A) 06/20/2018 2033   APPEARANCEUR CLEAR 06/20/2018 2033   LABSPEC 1.006 06/20/2018 2033   PHURINE 6.0 06/20/2018 2033   GLUCOSEU NEGATIVE 06/20/2018 2033   [place] NEGATIVE 06/20/2018 2033   [place] NEGATIVE 06/20/2018 2033   [place] NEGATIVE 06/20/2018 2033   PROTEINUR NEGATIVE 06/20/2018 2033   UROBILINOGEN 1.0 07/17/2015 1425   NITRITE NEGATIVE 06/20/2018 2033   LEUKOCYTESUR NEGATIVE 06/20/2018 2033   Recent Results (from the past 240 hour(s))  Culture, blood (routine x 2)     Status: None   Collection Time: 06/20/18  7:24 PM  Result Value Ref Range Status   Specimen Description BLOOD  LEFT HAND  Final   Special Requests   Final    BOTTLES DRAWN AEROBIC ONLY Blood Culture adequate volume   Culture   Final    NO GROWTH 5 DAYS Performed at [facility], [place] [address] [place], [place] [address]    Report Status 06/25/2018 FINAL  Final  Culture, blood (routine x 2)     Status: None   Collection Time: 06/20/18  7:30 PM  Result Value Ref Range Status   Specimen Description BLOOD RIGHT HAND  Final   Special Requests   Final    BOTTLES DRAWN AEROBIC ONLY Blood Culture adequate volume   Culture   Final    NO GROWTH 5 DAYS Performed at [facility], [place] [address], [place], [place] [address]    Report Status 06/25/2018 FINAL  Final      Radiology Studies: No results found.  Scheduled Meds: . allopurinol  100 mg Oral Daily  . aspirin EC  81 mg Oral Daily  . atorvastatin  80 mg Oral Daily  . carvedilol  50 mg Oral BID WC  . cholecalciferol  2,000 Units Oral Daily  . clopidogrel  75 mg Oral Daily  . docusate sodium  100 mg Oral BID  . doxycycline  100 mg Oral Q12H  . famotidine  20 mg Oral Daily  . furosemide  40 mg Oral BID  . gabapentin  300 mg Oral BID  . insulin aspart  0-9 Units Subcutaneous TID WC  . insulin glargine  10 Units Subcutaneous QHS  . multivitamin with minerals  1 tablet Oral Daily  . pantoprazole  40 mg Oral Daily  . ranolazine  1,000 mg Oral BID  . sodium chloride flush  3 mL Intravenous Q12H  . sodium chloride flush  3 mL Intravenous Q12H  . sorbitol  30 mL Oral Once  . vitamin E  400 Units Oral Daily   Continuous Infusions: . sodium chloride 10 mL/hr at 06/25/18 0918  . sodium chloride    . sodium chloride    . ampicillin-sulbactam (UNASYN) IV 200 mL/hr at 06/25/18 0615  . nitroGLYCERIN 15 mcg/min (06/25/18 1357)     LOS: 6 days   Time spent: 25 minutes.  Patrecia Pour,  MD [facility] [email or web address] Password [email or web address] 06/25/2018, 2:53 PM

## 2018-06-25 NOTE — Progress Notes (Signed)
Pharmacy Antibiotic Note  Matthew Rowe. is a 57 y.o. male admitted on 06/19/2018 with pneumonia.  Pharmacy has been consulted for Unasyn dosing. Pt also on Doxycycline. Dosed appropriately for CrCl~20 ml/min.  Unasyn 10/11>> Doxy 10/12>>  10/11 Bld. X 2 >> NGTD  Plan: Continue Unasyn 3gm IV q12h F/u length of therapy and renal function  Height: 5\' 5"  (165.1 cm) Weight: 232 lb 1.6 oz (105.3 kg) IBW/kg (Calculated) : 61.5  Temp (24hrs), Avg:97.6 F (36.4 C), Min:97.1 F (36.2 C), Max:98 F (36.7 C)  Recent Labs  Lab 06/20/18 1928 06/21/18 0212 06/22/18 0410 06/23/18 0051 06/23/18 0643 06/24/18 0421 06/25/18 0326  WBC  --  14.9* 11.3*  --  8.9 6.7 8.5  CREATININE  --  3.79* 4.52* 4.91*  --  4.76* 4.69*  4.64*  LATICACIDVEN 2.6* 1.4  --   --   --   --   --     Estimated Creatinine Clearance: 19.4 mL/min (A) (by C-G formula based on SCr of 4.64 mg/dL (H)).    Allergies  Allergen Reactions  . Cleocin [Clindamycin Hcl] Anaphylaxis and Swelling  . Glimepiride [Amaryl] Other (See Comments)    Elevates liver function   . Clarithromycin Itching and Other (See Comments)    "Biaxin" Eyes itch and burn  . Colchicine Other (See Comments)    Affected kidneys   . Ciprocin-Fluocin-Procin [Fluocinolone Acetonide] Rash     Thank you for allowing pharmacy to be a part of this patient's care.  Sherlon Handing, PharmD, BCPS Clinical pharmacist  **Pharmacist phone directory can now be found on [email or web address] ([email or web address]).  Listed under [place]. 06/25/2018 1:11 PM

## 2018-06-25 NOTE — Progress Notes (Signed)
ANTICOAGULATION CONSULT NOTE - Follow Up Consult  Pharmacy Consult for Heparin  Indication: chest pain/ACS  Allergies  Allergen Reactions  . Cleocin [Clindamycin Hcl] Anaphylaxis and Swelling  . Glimepiride [Amaryl] Other (See Comments)    Elevates liver function   . Clarithromycin Itching and Other (See Comments)    "Biaxin" Eyes itch and burn  . Colchicine Other (See Comments)    Affected kidneys   . Ciprocin-Fluocin-Procin [Fluocinolone Acetonide] Rash    Patient Measurements: Height: 5\' 5"  (165.1 cm) Weight: 232 lb 1.6 oz (105.3 kg) IBW/kg (Calculated) : 61.5 Heparin Dosing Weight: 87 kg  Vital Signs: Temp: 98 F (36.7 C) (10/16 0425) Temp Source: Axillary (10/16 0425) BP: 143/79 (10/16 0425) Pulse Rate: 79 (10/16 0425)  Labs: Recent Labs    06/23/18 0051  06/23/18 0643 06/23/18 0922 06/24/18 0421 06/24/18 0749 06/25/18 0326  HGB  --    < > 9.7*  --  10.3*  --  8.8*  HCT  --   --  29.8*  --  32.0*  --  27.8*  PLT  --   --  167  --  186  --  189  HEPARINUNFRC 0.25*  --   --  0.37  --  0.50 0.63  CREATININE 4.91*  --   --   --  4.76*  --  4.69*  4.64*   < > = values in this interval not displayed.    Estimated Creatinine Clearance: 19.4 mL/min (A) (by C-G formula based on SCr of 4.64 mg/dL (H)).  Assessment:  57 yr old male on IV heparin for CAD. S/p cath 10/14 which shoed severe LAD dz and plan for future PCI. Heparin restarted post-cath. Heparin level therapeutic (0.68) on gtt at 1250 units/hr. Hgb low but stable, plt stable. No bleeding noted.  Plans for PCI today  Goal of Therapy:  Heparin level 0.3-0.7 units/ml Monitor platelets by anticoagulation protocol: Yes   Plan:  Continue heparin infusion at 1250 units/hr  F/u post PCI   Sherlon Handing, PharmD, BCPS Clinical pharmacist  **Pharmacist phone directory can now be found on [email or web address] ([email or web address]).  Listed under [place]. Thank you for allowing pharmacy to be part of this patients care  team. 06/25/2018 9:01 AM

## 2018-06-25 NOTE — Progress Notes (Signed)
I was asked to evaluate the patient for possible PCI of the LAD.  I reviewed the patient's history and imaging.  The patient was transferred due to abnormal stress test as he is being evaluated for kidney transplant.  He did not present with unstable angina.  He does report worsening stable angina over the last few months which has required him to take nitroglycerin more frequently than his usual. I reviewed the cardiac catheterization images with Dr. Irish Lack.  The patient has a patent RIMA to RCA with patent distal RCA stent.  The left main is patent but there is significant ostial LAD stenosis as well as significant in-stent restenosis in the proximal to mid segment of the LAD.  The mid to distal LAD is occluded and this is a chronic finding.  The LAD originates at a steep angle from the left main and there is a large ramus branch in addition to the left circumflex. PCI of the LAD requires atherectomy due to calcifications and also pacing the stent all the way back to the left main coronary artery with subsequent jailing of the large ramus branch and left circumflex.  Given that the LAD territory is partially infarcted and there are no diagonal branches, I think the risks outweighs the benefits.   Echocardiogram showed normal LV systolic function with hypokinesis of the mid to distal anteroseptal anterior myocardium as well as apical akinesis.  I recommend intensifying his antianginal therapy and reserving PCI for more refractory symptoms.

## 2018-06-25 NOTE — Progress Notes (Addendum)
Progress Note  Patient Name: Matthew WALLEN Sr. Date of Encounter: 06/25/2018  Primary Cardiologist: Nelva Bush, MD (Trainsitioning to Dr. Irish Lack)   Subjective   No significant overnight events. Patient notes some mild chest pain that started about 15 minutes ago. He denies any shortness of breath, palpitations, lightheadedness, or dizziness.   Inpatient Medications    Scheduled Meds: . allopurinol  100 mg Oral Daily  . aspirin EC  81 mg Oral Daily  . atorvastatin  80 mg Oral Daily  . carvedilol  50 mg Oral BID WC  . cholecalciferol  2,000 Units Oral Daily  . clopidogrel  75 mg Oral Daily  . docusate sodium  100 mg Oral BID  . doxycycline  100 mg Oral Q12H  . famotidine  20 mg Oral Daily  . gabapentin  300 mg Oral BID  . insulin aspart  0-9 Units Subcutaneous TID WC  . insulin glargine  10 Units Subcutaneous QHS  . multivitamin with minerals  1 tablet Oral Daily  . pantoprazole  40 mg Oral Daily  . ranolazine  500 mg Oral BID  . sodium chloride flush  3 mL Intravenous Q12H  . sodium chloride flush  3 mL Intravenous Q12H  . sorbitol  30 mL Oral Once  . vitamin E  400 Units Oral Daily   Continuous Infusions: . sodium chloride 10 mL/hr at 06/25/18 0918  . sodium chloride    . sodium chloride    . ampicillin-sulbactam (UNASYN) IV 200 mL/hr at 06/25/18 0615  . heparin 1,250 Units/hr (06/25/18 0615)  . nitroGLYCERIN 30 mcg/min (06/25/18 0615)   PRN Meds: sodium chloride, sodium chloride, acetaminophen, diphenhydrAMINE, guaiFENesin-dextromethorphan, HYDROmorphone (DILAUDID) injection, nitroGLYCERIN, ondansetron (ZOFRAN) IV, sodium chloride flush, sodium chloride flush, traMADol, zolpidem   Vital Signs    Vitals:   06/24/18 1600 06/24/18 1700 06/24/18 2107 06/25/18 0425  BP:   140/78 (!) 143/79  Pulse: 84  79 79  Resp: 18 (!) 23 (!) 22 20  Temp:   (!) 97.1 F (36.2 C) 98 F (36.7 C)  TempSrc:   Oral Axillary  SpO2: 98%  99% 95%  Weight:    105.3 kg    Height:        Intake/Output Summary (Last 24 hours) at 06/25/2018 1010 Last data filed at 06/25/2018 0916 Gross per 24 hour  Intake 2059.72 ml  Output 1800 ml  Net 259.72 ml   Filed Weights   06/23/18 0550 06/24/18 0439 06/25/18 0425  Weight: 102.8 kg 102.6 kg 105.3 kg    Telemetry    Normal sinus rhythm, rate in the 80s bpm - Personally Reviewed  Physical Exam   GEN: 57 year old obese African American male resting comfortable in no acute distress.    Neck: Difficult to assess JVD due to body habitus. Cardiac: RRR. No significant murmurs, rubs, or gallops.  Respiratory: Minimal crackles in bases but overall lungs relatively cear to auscultation. GI: Abdomen soft, obese, and non-tender to palpation. Bowel sounds present. MS: No lower extremity edema. Neuro:  No focal deficits. Psych: Normal affect   Labs    Chemistry Recent Labs  Lab 06/23/18 0051 06/24/18 0421 06/25/18 0326  NA 134* 137 137  136  K 3.9 4.6 4.1  4.0  CL 97* 101 102  102  CO2 26 21* 24  24  GLUCOSE 175* 204* 133*  131*  BUN 57* 59* 62*  62*  CREATININE 4.91* 4.76* 4.69*  4.64*  CALCIUM 8.6* 9.1 8.6*  8.6*  PROT  --   --  6.3*  ALBUMIN 2.8* 2.9* 2.7*  2.7*  AST  --   --  27  ALT  --   --  20  ALKPHOS  --   --  55  BILITOT  --   --  0.7  GFRNONAA 12* 12* 13*  13*  GFRAA 14* 14* 14*  15*  ANIONGAP 11 15 11  10      Hematology Recent Labs  Lab 06/23/18 0643 06/24/18 0421 06/25/18 0326  WBC 8.9 6.7 8.5  RBC 3.32* 3.56* 3.08*  HGB 9.7* 10.3* 8.8*  HCT 29.8* 32.0* 27.8*  MCV 89.8 89.9 90.3  MCH 29.2 28.9 28.6  MCHC 32.6 32.2 31.7  RDW 14.1 13.9 14.0  PLT 167 186 189    Cardiac Enzymes Recent Labs  Lab 06/20/18 1036 06/20/18 1928 06/21/18 0212 06/21/18 0904  TROPONINI 0.19* 0.22* 0.26* 0.24*    Recent Labs  Lab 06/19/18 1651  TROPIPOC 0.01     BNPNo results for input(s): BNP, PROBNP in the last 168 hours.   DDimer No results for input(s): DDIMER in the  last 168 hours.   Radiology    No results found.  Cardiac Studies   CATH: 06/23/2018  Mid LM to Dist LM lesion is 40% stenosed.  Ost LAD lesion is 70% stenosed.  Prox LAD lesion is 80% stenosed.  Dist LAD lesion is 100% stenosed.  Mid RCA to Dist RCA lesion is 10% stenosed.  Dist RCA lesion is 50% stenosed.  Mid LAD lesion is 50% stenosed.  Prox LAD to Mid LAD lesion is 50% stenosed.  PA pressure 30/16 mm Hg; mean PA 22 mm Hg; PCWP 12/12 PCWP 12 mm Hg; Ao sat 92%, PA sat 66 %; CO 8.1 L/min; CI 3.8  Severe LAD disease. It appears that the ostial LAD disease has worsened since the last cath and the mid LAD has also significantly worsened. THe mid LAd may be the culprit for his presentation.   Will discuss with colleagues, plan for PCI. Distal left main trifurcation with significant ostial LAD disease.  Restart heparin 8 hours post sheath pull.  Watch renal function. Post procedure hydration. Will have to watch for fluid overload.  Diagnostic Diagram         Per Dr. Darnelle Bos notes Cath/PCI:  LHC (12/02/13): LMCA with 30% distal stenosis. LAD with multiple stents and 50% ostial stenosis followed by 20% diffuse in-stent restenosis proximally. There is diffuse 50% in-stent restenosis in the mid and distal segments. The distal LAD is occluded. LCx is small with 40-50% ostial stenosis. OM 2 has a 50% ostial stenosis. Ramus intermedius is a large vessel with 70-80% stenosis involving its superior branch. 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 There is diffuse 20% proximal disease. There is a 90% stenosis involving the distal vessel just beyond the anastomosis with the RIMA. 60% stenosis extending into the RPDA is also evident. RIMA to RCA is widely patent with aforementioned stenosis just beyond the distal anastomosis.  PCI (12/03/13): Successful PCI to the mid/distal RCA with overlapping Promus drug-eluting stents (2.25 x 24 and 2.25 x 16 mm).  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   Patient Profile     57 y.o. male with history of known CAD, CKD stage 5 progressing towards renal transplant with fistula in place, hypertension, chronic angina who presents after having chest pain during a stress test,  with report of stress test being significant abnormal.  Assessment & Plan    1. Unstable angina - Patient currently complaining of mild chest pain that started about 15 minutes prior to seeing patient. No shortness of breath. - Patient has SL Nitro ordered on an as needed basis.  - Continue Ranexa.  - Patient is scheduled for PCI later this afternoon. However, our interventional cardiologists (Dr. Irish Lack or Dr. Fletcher Anon) will discuss whether patient will go to angiography.  2. CAD s/p CABG - Continue aggressive CRF reduction. - LDL 73. Given CAD, goal <70. - Continue Lipitor 80mg  daily. - BP well-controlled. - Continue Coreg 50mg  twice daily. - A1c was 7.0 in 05/2018  3. Renal insufficiency  - Renal function stable after catheterization on 06/23/2018. - SCr stable at 4.69 today.  - Not on ACE/ARB. - Continue to follow renal function daily.  4. Congestive Heart Failure acute HFPEF - Initially on Lasix 60 mg IV BID. Discontinued on 06/22/2018. - Patient on Lasix 40 mg twice daily at home. - Documented urine output of 1.8L in the past 24 hours.  - Patient has minimal crackles in bilateral bases but does not appear significantly volume overloaded.  - Will continue to hold off on further diuresis until after catheterization unless patient develops worsening SOB   For questions or updates, please contact [place] Please consult [email or web address] for contact info under        Signed, Darreld Mclean, PA-C  06/25/2018, 10:10 AM   ---------------------------------------------------------------------------------   History and all data above reviewed.  Patient  examined.  I agree with the findings as above.  Matthew Kirks Sr. had mild chest pain this morning.   Gen: NAD CV: regular rhythm Lungs: clear Extremities: warm, well perfused.  All available labs, radiology testing, previous records reviewed. Agree with documented assessment and plan of my colleague as stated above with the following additions or changes:  Principal Problem:   Unstable angina ([place]) Active Problems:   Hypertension   Type 2 diabetes with nephropathy (HCC)   CAD- S/P multiple PCIs, CABG   Gout   HLD (hyperlipidemia)   CKD (chronic kidney disease) stage 4, GFR 15-29 ml/min (HCC)   Chronic anemia   Chronic diastolic heart failure (HCC)   Leukocytosis   Chronic pain   Insomnia   Acute respiratory failure (HCC)   Acute diastolic (congestive) heart failure (HCC)   Coronary artery disease involving native heart with unstable angina pectoris ([place])    Plan: 1) unstable angina - defer PCI at this time per Dr. Fletcher Anon, he has discussed this in detail with the patient.  -Will discontinue heparin, nitro infusion -will increase ranexa for antianginal effect  2) Renal insufficiency 3) Congestive Heart Failure acute HFPEF - will restart home lasix, creatinine stable  - appears comfortable, not in heart failure today. -monitor Cr daily, and I/Os  Elouise Munroe, MD [facility] 1:08 PM  06/25/2018

## 2018-06-25 NOTE — Plan of Care (Signed)
  Problem: Clinical Measurements: Goal: Will remain free from infection Outcome: Progressing Goal: Respiratory complications will improve Outcome: Progressing   Problem: Activity: Goal: Risk for activity intolerance will decrease Outcome: Progressing   Problem: Health Behavior/Discharge Planning: Goal: Ability to manage health-related needs will improve Outcome: Completed/Met   Problem: Elimination: Goal: Will not experience complications related to bowel motility Outcome: Completed/Met Goal: Will not experience complications related to urinary retention Outcome: Completed/Met

## 2018-06-26 DIAGNOSIS — G8929 Other chronic pain: Secondary | ICD-10-CM

## 2018-06-26 DIAGNOSIS — I10 Essential (primary) hypertension: Secondary | ICD-10-CM

## 2018-06-26 LAB — GLUCOSE, CAPILLARY: Glucose-Capillary: 160 mg/dL — ABNORMAL HIGH (ref 70–99)

## 2018-06-26 LAB — CBC
HEMATOCRIT: 30.8 % — AB (ref 39.0–52.0)
Hemoglobin: 9.7 g/dL — ABNORMAL LOW (ref 13.0–17.0)
MCH: 29.2 pg (ref 26.0–34.0)
MCHC: 31.5 g/dL (ref 30.0–36.0)
MCV: 92.8 fL (ref 80.0–100.0)
Platelets: 198 10*3/uL (ref 150–400)
RBC: 3.32 MIL/uL — AB (ref 4.22–5.81)
RDW: 14.1 % (ref 11.5–15.5)
WBC: 8.7 10*3/uL (ref 4.0–10.5)
nRBC: 0 % (ref 0.0–0.2)

## 2018-06-26 LAB — RENAL FUNCTION PANEL
ALBUMIN: 2.9 g/dL — AB (ref 3.5–5.0)
ANION GAP: 7 (ref 5–15)
BUN: 58 mg/dL — ABNORMAL HIGH (ref 6–20)
CALCIUM: 8.8 mg/dL — AB (ref 8.9–10.3)
CO2: 25 mmol/L (ref 22–32)
Chloride: 105 mmol/L (ref 98–111)
Creatinine, Ser: 4.45 mg/dL — ABNORMAL HIGH (ref 0.61–1.24)
GFR calc non Af Amer: 13 mL/min — ABNORMAL LOW (ref >60)
GFR, EST AFRICAN AMERICAN: 15 mL/min — AB (ref >60)
Glucose, Bld: 163 mg/dL — ABNORMAL HIGH (ref 70–99)
PHOSPHORUS: 4.8 mg/dL — AB (ref 2.5–4.6)
Potassium: 4.8 mmol/L (ref 3.5–5.1)
SODIUM: 137 mmol/L (ref 135–145)

## 2018-06-26 MED ORDER — TRAMADOL HCL 50 MG PO TABS: 50.0000 mg | ORAL_TABLET | Freq: Two times a day (BID) | ORAL | 0 refills | Status: DC | PRN

## 2018-06-26 MED ORDER — RANOLAZINE ER 500 MG PO TB12: 500.0000 mg | ORAL_TABLET | Freq: Two times a day (BID) | ORAL | Status: DC

## 2018-06-26 MED ORDER — FAMOTIDINE 20 MG PO TABS: 20.0000 mg | ORAL_TABLET | Freq: Every day | ORAL | Status: DC

## 2018-06-26 MED ORDER — FUROSEMIDE 80 MG PO TABS: 80.0000 mg | ORAL_TABLET | Freq: Two times a day (BID) | ORAL | Status: DC

## 2018-06-26 MED ORDER — FUROSEMIDE 80 MG PO TABS: 80.0000 mg | ORAL_TABLET | Freq: Two times a day (BID) | ORAL | 0 refills | Status: DC

## 2018-06-26 MED ORDER — AMLODIPINE BESYLATE 10 MG PO TABS: 10.0000 mg | ORAL_TABLET | Freq: Every day | ORAL | Status: DC

## 2018-06-26 NOTE — Progress Notes (Signed)
Subjective: Interval History: has no complaint, ready to go home.  Objective: Vital signs in last 24 hours: Temp:  [97.9 F (36.6 C)-98.2 F (36.8 C)] 98 F (36.7 C) (10/17 0544) Pulse Rate:  [78-88] 86 (10/17 0930) Resp:  [16-24] 24 (10/17 0930) BP: (121-139)/(61-87) 121/87 (10/17 0544) SpO2:  [95 %-100 %] 99 % (10/17 0930) FiO2 (%):  [28 %] 28 % (10/17 0930) Weight:  [104.7 kg] 104.7 kg (10/17 0544) Weight change: -0.59 kg  Intake/Output from previous day: 10/16 0701 - 10/17 0700 In: 1569.4 [P.O.:1320; I.V.:154.8; IV Piggyback:94.6] Out: 575 [Urine:575] Intake/Output this shift: Total I/O In: 240 [P.O.:240] Out: -   General appearance: alert, cooperative, no distress and moderately obese Resp: diminished breath sounds bilaterally Cardio: S1, S2 normal and systolic murmur: systolic ejection 2/6, crescendo and decrescendo at 2nd left intercostal space GI: obese, pos bs, soft Extremities: edema 1+  Lab Results: Recent Labs    06/25/18 0326 06/26/18 0638  WBC 8.5 8.7  HGB 8.8* 9.7*  HCT 27.8* 30.8*  PLT 189 198   BMET:  Recent Labs    06/25/18 0326 06/26/18 0638  NA 137  136 137  K 4.1  4.0 4.8  CL 102  102 105  CO2 24  24 25   GLUCOSE 133*  131* 163*  BUN 62*  62* 58*  CREATININE 4.69*  4.64* 4.45*  CALCIUM 8.6*  8.6* 8.8*   Recent Labs    06/24/18 0421  PTH 111*   Iron Studies:  Recent Labs    06/25/18 0941  IRON 108  TIBC 199*    Studies/Results: No results found.  I have reviewed the patient's current medications.  Assessment/Plan: 1 CKD 4 ^ lasix. Follow wgts at home 2 CAD ??no intervention 3 DM controlled 4 Obesity 5 Anemia Fe ok 6 HPTH  7 ^lipids P ^ Lasix, f/u outpatient    LOS: 7 days   Jeneen Rinks Siriah Treat 06/26/2018,10:33 AM

## 2018-06-26 NOTE — Discharge Summary (Signed)
Physician Discharge Summary  Kimberley Dastrup XBM:[id number] DOB: 08/10/1961 DOA: 06/19/2018  PCP: [name], MD  Admit date: 06/19/2018 Discharge date: 06/26/2018  Admitted From: Home by way of [place] Disposition: Home   Recommendations for Outpatient Follow-up:  1. Follow up with PCP in 1-2 weeks 2. Follow up with nephrology per routine, repeat BMP 3. Follow up with cardiology in the next 2 weeks for management of chronic angina.  Home Health: None Equipment/Devices: None Discharge Condition: Stable CODE STATUS: Full Diet recommendation: Heart healthy, carb-modified  Brief/Interim Summary: Matthew Rowe.is a 57 y.o.malewith a history of CAD s/p CABG and multiple PCI with chronic angina, chronic HFpEF, T2DM, HTN, and stage IV CKD who presented with chest pain at the advice of MD at [facility] during/following stress testing. Test was preoperative workup for potential renal transplantation. Chest pain described as 10/10 substernal chest pressure with dyspnea started with stress agent administration. On arrival he was chest pain free with negative troponin and ECG showing sinus tachycardia with LBBB. CXR negative and WBC 11.9k. He was admitted with cardiology consultation for unstable angina, started on heparin gtt and subsequently nitroglycerin gtt. IV fluids were given for CKD prior to catheterization but patient developed acute pulmonary edema requiring IV lasix and BiPAP, delaying LHC. Antibiotics also started for asymmetric appearance of edema. Once stable, he underwent cath which showed severe LAD disease for which PCI was considered. After multidisciplinary discussions regarding the risk of PCI with the complexity of his lesions, it was felt that the risk outweighed the benefit and maximal medical therapy is continued. Nephrology has recommended increasing lasix which was done and the patient discharged in stable condition.  Discharge Diagnoses:  Principal  Problem:   Unstable angina (HCC) Active Problems:   Hypertension   Type 2 diabetes with nephropathy (HCC)   CAD- S/P multiple PCIs, CABG   Gout   HLD (hyperlipidemia)   CKD (chronic kidney disease) stage 4, GFR 15-29 ml/min (HCC)   Chronic anemia   Chronic diastolic heart failure (HCC)   Leukocytosis   Chronic pain   Insomnia   Acute respiratory failure (HCC)   Acute diastolic (congestive) heart failure (HCC)   Coronary artery disease involving native heart with unstable angina pectoris (HCC)  Acute hypoxic respiratory failure: Due to pulmonary edema in setting of UA and severe CKD with IVF's. Also possibly component of pneumonia.  - Initially requiring BiPAP, but has been stable off this, hypoxia resolved with initiation of lasix and completed antibiotics 10/17 with no further fever, leukocytosis.  CAD with unstable angina: With wall motion abnormalities on echo (Hypokinesis of the apical inferior, mid-apical inferoseptal, and mid-apical anteroseptal myocardium. Apical akinesis.) and severe LAD disease on LHC. Stenting was initially recommended but after long discussion was deferred. Dr. Tyrell Antonio note explains thought process:  "I was asked to evaluate the patient for possible PCI of the LAD.  I reviewed the patient's history and imaging.  The patient was transferred due to abnormal stress test as he is being evaluated for kidney transplant.  He did not present with unstable angina.  He does report worsening stable angina over the last few months which has required him to take nitroglycerin more frequently than his usual. I reviewed the cardiac catheterization images with Dr. Irish Lack.  The patient has a patent RIMA to RCA with patent distal RCA stent.  The left main is patent but there is significant ostial LAD stenosis as well as significant in-stent restenosis in the proximal  to mid segment of the LAD.  The mid to distal LAD is occluded and this is a chronic finding.  The LAD originates at  a steep angle from the left main and there is a large ramus branch in addition to the left circumflex. PCI of the LAD requires atherectomy due to calcifications and also pacing the stent all the way back to the left main coronary artery with subsequent jailing of the large ramus branch and left circumflex.  Given that the LAD territory is partially infarcted and there are no diagonal branches, I think the risks outweighs the benefits.   Echocardiogram showed normal LV systolic function with hypokinesis of the mid to distal anteroseptal anterior myocardium as well as apical akinesis.  I recommend intensifying his antianginal therapy and reserving PCI for more refractory symptoms"  Stage IV CKD: Nonoliguric. Planning to continue work up for renal transplantation. - Nephrology consulted, recommended increased lasix dosing and nephrology outpatient follow up.  T2DM:  - Continue home medications  HTN:  - Continue coreg, restart norvasc for antianginal effect  Acute on chronic HFpEF:  - Manage BP as above.  - Restart lasix when able.   Hyperlipidemia: Chronic, stable - Continue statin  Gout: Chronic, stable.  - Continue allopurinol. Received prednisone 10/14.  Peripheral neuropathy:  - Continue gabapentin  Pain related to left AVF:  - Continue home tramadol  Constipation: Resolved.  - Monitor  Insomnia:  - Continue home ambien prn  Discharge Instructions Discharge Instructions    (HEART FAILURE PATIENTS) Call MD:  Anytime you have any of the following symptoms: 1) 3 pound weight gain in 24 hours or 5 pounds in 1 week 2) shortness of breath, with or without a dry hacking cough 3) swelling in the hands, feet or stomach 4) if you have to sleep on extra pillows at night in order to breathe.   Complete by:  As directed    Diet - low sodium heart healthy   Complete by:  As directed    Discharge instructions   Complete by:  As directed    You were evaluated for positive  stress testing and chest pain. Catheterization was performed though it was felt the risk of placing a stent was higher than the potential benefit so it is recommended that you continue high intensity medications and follow up as an outpatient. If your symptoms worsen, seek medical attention and if they continue to get worse then intervention may be warranted.   Nephrology has recommended that you increase the dose of lasix to 80mg  twice daily and check and record your weights daily at home. Follow up with Dr. Irish Lack in 2 weeks or sooner if you notice weight gain or decreased urine output.   Increase activity slowly   Complete by:  As directed      Allergies as of 06/26/2018      Reactions   Cleocin [clindamycin Hcl] Anaphylaxis, Swelling   Glimepiride [amaryl] Other (See Comments)   Elevates liver function   Clarithromycin Itching, Other (See Comments)   "Biaxin" Eyes itch and burn   Colchicine Other (See Comments)   Affected kidneys   Ciprocin-fluocin-procin [fluocinolone Acetonide] Rash      Medication List    TAKE these medications   acetaminophen 500 MG tablet Commonly known as:  TYLENOL Take 500 mg by mouth 3 (three) times daily as needed (for pain or headaches).   allopurinol 100 MG tablet Commonly known as:  ZYLOPRIM Take 100 mg by mouth daily.  amLODipine 10 MG tablet Commonly known as:  NORVASC Take 1 tablet (10 mg total) by mouth daily.   aspirin 81 MG EC tablet Take 1 tablet (81 mg total) by mouth daily.   atorvastatin 80 MG tablet Commonly known as:  LIPITOR Take 1 tablet (80 mg total) by mouth daily.   carvedilol 25 MG tablet Commonly known as:  COREG Take 2 tablets (50 mg total) by mouth 2 (two) times daily.   Cholecalciferol 2000 units Tabs Take 2,000 Units by mouth daily.   clopidogrel 75 MG tablet Commonly known as:  PLAVIX TAKE ONE TABLET BY MOUTH ONCE DAILY WITH BREAKFAST What changed:  See the new instructions.   diphenhydrAMINE 25 MG  tablet Commonly known as:  BENADRYL Take 25 mg by mouth every 6 (six) hours as needed for allergies or sleep.   furosemide 80 MG tablet Commonly known as:  LASIX Take 1 tablet (80 mg total) by mouth 2 (two) times daily. What changed:    medication strength  how much to take   gabapentin 300 MG capsule Commonly known as:  NEURONTIN Take 1 capsule (300 mg total) by mouth 2 (two) times daily.   hydrALAZINE 50 MG tablet Commonly known as:  APRESOLINE Take 1 tablet (50 mg total) by mouth 3 (three) times daily.   isosorbide mononitrate 120 MG 24 hr tablet Commonly known as:  IMDUR Take 1 tablet (120 mg total) by mouth 2 (two) times daily.   Misc. Devices Misc by Does not apply route. C-PAP   multivitamin with minerals Tabs tablet Take 1 tablet by mouth daily.   nitroGLYCERIN 0.4 MG SL tablet Commonly known as:  NITROSTAT Place 1 tablet (0.4 mg total) under the tongue every 5 (five) minutes as needed for chest pain.   NOVOLIN 70/30 [place] Inject 20 Units into the skin 2 (two) times daily.   OZEMPIC [place] Inject 2.5 Units into the skin every Saturday.   pantoprazole 40 MG tablet Commonly known as:  PROTONIX Take 40 mg by mouth daily.   ranitidine 300 MG tablet Commonly known as:  ZANTAC Take 300 mg by mouth at bedtime.   ranolazine 500 MG 12 hr tablet Commonly known as:  RANEXA Take 1 tablet (500 mg total) by mouth 2 (two) times daily.   traMADol 50 MG tablet Commonly known as:  ULTRAM Take 1 tablet (50 mg total) by mouth every 12 (twelve) hours as needed for moderate pain. What changed:  when to take this   vitamin E 100 UNIT capsule Take 400 Units by mouth daily.   zolpidem 10 MG tablet Commonly known as:  AMBIEN Take 10 mg by mouth at bedtime as needed for sleep.      Follow-up Information    [name], MD Follow up.   Specialty:  Family Medicine Contact information: [address] [place] [address] [phone]        [name], MD. Schedule an appointment as soon as possible for a visit in 2 week(s).   Specialties:  Cardiology, Radiology, Interventional Cardiology Contact information: [address] [place] [address] [phone]          Allergies  Allergen Reactions  . Cleocin [Clindamycin Hcl] Anaphylaxis and Swelling  . Glimepiride [Amaryl] Other (See Comments)    Elevates liver function   . Clarithromycin Itching and Other (See Comments)    "Biaxin" Eyes itch and burn  . Colchicine Other (See Comments)    Affected  kidneys   . Ciprocin-Fluocin-Procin [Fluocinolone Acetonide] Rash    Consultations:  Cardiology  Procedures/Studies: Dg Chest 2 View  Result Date: 06/19/2018 CLINICAL DATA:  Chest pain EXAM: CHEST - 2 VIEW COMPARISON:  06/17/2018 FINDINGS: Prior median sternotomy. Heart is normal size. No confluent airspace opacities or effusions. IMPRESSION: No active cardiopulmonary disease. Electronically Signed   By: Rolm Baptise M.D.   On: 06/19/2018 17:38   Dg Chest 2 View  Result Date: 06/17/2018 CLINICAL DATA:  Chest pain. EXAM: CHEST - 2 VIEW COMPARISON:  Radiograph of October 16, 2017. FINDINGS: Stable cardiomediastinal silhouette. Sternotomy wires are noted. No pneumothorax or pleural effusion is noted. Both lungs are clear. The visualized skeletal structures are unremarkable. IMPRESSION: No active cardiopulmonary disease. Electronically Signed   By: Marijo Conception, M.D.   On: 06/17/2018 20:27   Dg Chest Port 1 View  Result Date: 06/22/2018 CLINICAL DATA:  Acute hypoxic respiratory failure. EXAM: PORTABLE CHEST 1 VIEW COMPARISON:  Chest x-rays dated 06/21/2018 and 06/20/2018. FINDINGS: Stable cardiomegaly. Improved aeration within the RIGHT lung, compatible with resolving edema or resolving pneumonia. No new lung abnormality. No pleural effusion or pneumothorax seen. IMPRESSION: Improved aeration of the RIGHT lung, suggesting improved fluid status and/or  resolving pneumonia. Electronically Signed   By: Franki Cabot M.D.   On: 06/22/2018 10:38   Dg Chest Port 1 View  Result Date: 06/21/2018 CLINICAL DATA:  Acute respiratory failure. EXAM: PORTABLE CHEST 1 VIEW COMPARISON:  One-view chest x-ray 06/20/2018 FINDINGS: Heart is enlarged. Lung volumes are low. Progressive airspace consolidation is present in the right lung. Right greater than left interstitial disease is noted. IMPRESSION: 1. Progressive interstitial and airspace disease of the right lung consistent with pneumonia. Electronically Signed   By: San Morelle M.D.   On: 06/21/2018 10:18   Dg Chest Port 1 View  Result Date: 06/20/2018 CLINICAL DATA:  Chest pain. EXAM: PORTABLE CHEST 1 VIEW COMPARISON:  Portable film earlier in the day. FINDINGS: Cardiomegaly is redemonstrated. BILATERAL pulmonary opacities, worse on the RIGHT are redemonstrated, and slightly worse. No effusion or pneumothorax. IMPRESSION: Worsening aeration. Electronically Signed   By: Staci Righter M.D.   On: 06/20/2018 19:08   Dg Chest Port 1 View  Result Date: 06/20/2018 CLINICAL DATA:  Dyspnea EXAM: PORTABLE CHEST 1 VIEW COMPARISON:  06/19/2018 FINDINGS: Evolving pulmonary space opacities in the right upper, middle and lower lobes. Left lung is relatively clear. Heart size is top-normal. Median sternotomy sutures are place. Nonaneurysmal minimally atherosclerotic aorta. No acute osseous abnormality. IMPRESSION: Evolving patchy airspace opacities in the right lung suspicious for evolving multilobar pneumonia. Unilateral pulmonary edema is also a possibility depending on clinical presentation. Electronically Signed   By: Ashley Royalty M.D.   On: 06/20/2018 14:05     Nephrology  Subjective: Feels well, has pants on and wants to go home ASAP. No chest pain, dyspnea, hypoxia.  Discharge Exam: Vitals:   06/26/18 0544 06/26/18 0930  BP: 121/87   Pulse: 78 86  Resp: 16 (!) 24  Temp: 98 F (36.7 C)   SpO2: 100%  99%   General: Pt is alert, awake, not in acute distress Cardiovascular: RRR, S1/S2 +, no rubs, no gallops Respiratory: CTA bilaterally, no wheezing, no rhonchi Abdominal: Soft, NT, ND, bowel sounds + Extremities: No edema, no cyanosis  Labs: BNP (last 3 results) Recent Labs    06/17/18 1934  BNP 712.4*   Basic Metabolic Panel: Recent Labs  Lab 06/22/18 0410 06/23/18 0051 06/24/18 0421 06/25/18  2993 06/26/18 0638  NA 136 134* 137 137  136 137  K 4.1 3.9 4.6 4.1  4.0 4.8  CL 102 97* 101 102  102 105  CO2 22 26 21* 24  24 25   GLUCOSE 144* 175* 204* 133*  131* 163*  BUN 56* 57* 59* 62*  62* 58*  CREATININE 4.52* 4.91* 4.76* 4.69*  4.64* 4.45*  CALCIUM 8.9 8.6* 9.1 8.6*  8.6* 8.8*  PHOS 3.7 3.8 4.6 4.6 4.8*   Liver Function Tests: Recent Labs  Lab 06/22/18 0410 06/23/18 0051 06/24/18 0421 06/25/18 0326 06/26/18 0638  AST  --   --   --  27  --   ALT  --   --   --  20  --   ALKPHOS  --   --   --  55  --   BILITOT  --   --   --  0.7  --   PROT  --   --   --  6.3*  --   ALBUMIN 3.1* 2.8* 2.9* 2.7*  2.7* 2.9*   No results for input(s): LIPASE, AMYLASE in the last 168 hours. No results for input(s): AMMONIA in the last 168 hours. CBC: Recent Labs  Lab 06/22/18 0410 06/23/18 0643 06/24/18 0421 06/25/18 0326 06/26/18 0638  WBC 11.3* 8.9 6.7 8.5 8.7  HGB 10.1* 9.7* 10.3* 8.8* 9.7*  HCT 31.3* 29.8* 32.0* 27.8* 30.8*  MCV 90.7 89.8 89.9 90.3 92.8  PLT 194 167 186 189 198   Cardiac Enzymes: Recent Labs  Lab 06/20/18 0449 06/20/18 1036 06/20/18 1928 06/21/18 0212 06/21/18 0904  TROPONINI 0.15* 0.19* 0.22* 0.26* 0.24*   BNP: Invalid input(s): POCBNP CBG: Recent Labs  Lab 06/25/18 0723 06/25/18 1057 06/25/18 1642 06/25/18 2139 06/26/18 0806  GLUCAP 116* 123* 131* 125* 160*   D-Dimer No results for input(s): DDIMER in the last 72 hours. Hgb A1c No results for input(s): HGBA1C in the last 72 hours. Lipid Profile Recent Labs     06/25/18 0326  CHOL 128  HDL 35*  LDLCALC 73  TRIG 101  CHOLHDL 3.7   Thyroid function studies No results for input(s): TSH, T4TOTAL, T3FREE, THYROIDAB in the last 72 hours.  Invalid input(s): FREET3 Anemia work up Recent Labs    06/25/18 0941  TIBC 199*  IRON 108   Urinalysis    Component Value Date/Time   COLORURINE STRAW (A) 06/20/2018 2033   APPEARANCEUR CLEAR 06/20/2018 2033   LABSPEC 1.006 06/20/2018 2033   [place] 6.0 06/20/2018 2033   [place] 06/20/2018 2033   [place] NEGATIVE 06/20/2018 2033   [place] NEGATIVE 06/20/2018 2033   [place] NEGATIVE 06/20/2018 2033   PROTEINUR NEGATIVE 06/20/2018 2033   UROBILINOGEN 1.0 07/17/2015 1425   NITRITE NEGATIVE 06/20/2018 2033   LEUKOCYTESUR NEGATIVE 06/20/2018 2033    Microbiology Recent Results (from the past 240 hour(s))  Culture, blood (routine x 2)     Status: None   Collection Time: 06/20/18  7:24 PM  Result Value Ref Range Status   Specimen Description BLOOD LEFT HAND  Final   Special Requests   Final    BOTTLES DRAWN AEROBIC ONLY Blood Culture adequate volume   Culture   Final    NO GROWTH 5 DAYS Performed at [facility], [address] [place], [place] [address]    Report Status 06/25/2018 FINAL  Final  Culture, blood (routine x 2)     Status: None   Collection Time: 06/20/18  7:30 PM  Result Value Ref Range Status   Specimen Description BLOOD RIGHT HAND  Final   Special Requests   Final    BOTTLES DRAWN AEROBIC ONLY Blood Culture adequate volume   Culture   Final    NO GROWTH 5 DAYS Performed at [facility], [address], [place], [place] [address]    Report Status 06/25/2018 FINAL  Final    Time coordinating discharge: Approximately 40 minutes  Patrecia Pour, MD  [facility] 06/26/2018, 11:35 AM Pager [phone]

## 2018-06-26 NOTE — Progress Notes (Signed)
[place] to see pt to walk. Pt discharged. Graylon Good RN BSN 06/26/2018 1:53 PM

## 2018-06-26 NOTE — Care Management Important Message (Signed)
Important Message  Patient Details  Name: Matthew ZENTZ Sr. MRN: [id number] Date of Birth: [birth year]   Medicare Important Message Given:  Yes    Barb Merino Wisam Siefring 06/26/2018, 11:48 AM

## 2018-06-26 NOTE — Progress Notes (Addendum)
Progress Note  Patient Name: Matthew PAINO Sr. Date of Encounter: 06/26/2018  Primary Cardiologist: Nelva Bush, MD /Dr. Irish Lack  Subjective   No chest pain and no SOB feels well  Inpatient Medications    Scheduled Meds: . allopurinol  100 mg Oral Daily  . aspirin EC  81 mg Oral Daily  . atorvastatin  80 mg Oral Daily  . carvedilol  50 mg Oral BID WC  . cholecalciferol  2,000 Units Oral Daily  . clopidogrel  75 mg Oral Daily  . docusate sodium  100 mg Oral BID  . doxycycline  100 mg Oral Q12H  . famotidine  20 mg Oral Daily  . furosemide  40 mg Oral BID  . gabapentin  300 mg Oral BID  . insulin aspart  0-9 Units Subcutaneous TID WC  . insulin glargine  10 Units Subcutaneous QHS  . multivitamin with minerals  1 tablet Oral Daily  . pantoprazole  40 mg Oral Daily  . ranolazine  1,000 mg Oral BID  . sodium chloride flush  3 mL Intravenous Q12H  . sodium chloride flush  3 mL Intravenous Q12H  . sorbitol  30 mL Oral Once  . vitamin E  400 Units Oral Daily   Continuous Infusions: . sodium chloride 10 mL/hr at 06/25/18 0918  . sodium chloride    . sodium chloride    . ampicillin-sulbactam (UNASYN) IV 3 g (06/26/18 0602)   PRN Meds: sodium chloride, sodium chloride, acetaminophen, diphenhydrAMINE, guaiFENesin-dextromethorphan, HYDROmorphone (DILAUDID) injection, loratadine, nitroGLYCERIN, ondansetron (ZOFRAN) IV, sodium chloride flush, sodium chloride flush, traMADol, zolpidem   Vital Signs    Vitals:   06/25/18 1745 06/25/18 2111 06/25/18 2325 06/26/18 0544  BP: 129/76 132/61  121/87  Pulse: 81 83  78  Resp: (!) 23 (!) 21 18 16   Temp:  98.2 F (36.8 C)  98 F (36.7 C)  TempSrc:  Oral  Oral  SpO2: 98% 95%  100%  Weight:    104.7 kg  Height:        Intake/Output Summary (Last 24 hours) at 06/26/2018 0646 Last data filed at 06/25/2018 2147 Gross per 24 hour  Intake 1329.38 ml  Output 575 ml  Net 754.38 ml   Filed Weights   06/24/18 0439 06/25/18  0425 06/26/18 0544  Weight: 102.6 kg 105.3 kg 104.7 kg    Telemetry    SR  - Personally Reviewed  ECG    No new - Personally Reviewed  Physical Exam   GEN: No acute distress.   Neck: No JVD Cardiac: RRR, no murmurs, rubs, or gallops.  Respiratory: Clear to few rales to auscultation bilaterally. GI: Soft, nontender, non-distended  MS: No edema; No deformity. Neuro:  Nonfocal  Psych: Normal affect   Labs    Chemistry Recent Labs  Lab 06/23/18 0051 06/24/18 0421 06/25/18 0326  NA 134* 137 137  136  K 3.9 4.6 4.1  4.0  CL 97* 101 102  102  CO2 26 21* 24  24  GLUCOSE 175* 204* 133*  131*  BUN 57* 59* 62*  62*  CREATININE 4.91* 4.76* 4.69*  4.64*  CALCIUM 8.6* 9.1 8.6*  8.6*  PROT  --   --  6.3*  ALBUMIN 2.8* 2.9* 2.7*  2.7*  AST  --   --  27  ALT  --   --  20  ALKPHOS  --   --  55  BILITOT  --   --  0.7  GFRNONAA 12*  12* 13*  13*  GFRAA 14* 14* 14*  15*  ANIONGAP 11 15 11  10      Hematology Recent Labs  Lab 06/23/18 0643 06/24/18 0421 06/25/18 0326  WBC 8.9 6.7 8.5  RBC 3.32* 3.56* 3.08*  HGB 9.7* 10.3* 8.8*  HCT 29.8* 32.0* 27.8*  MCV 89.8 89.9 90.3  MCH 29.2 28.9 28.6  MCHC 32.6 32.2 31.7  RDW 14.1 13.9 14.0  PLT 167 186 189    Cardiac Enzymes Recent Labs  Lab 06/20/18 1036 06/20/18 1928 06/21/18 0212 06/21/18 0904  TROPONINI 0.19* 0.22* 0.26* 0.24*    Recent Labs  Lab 06/19/18 1651  TROPIPOC 0.01     BNPNo results for input(s): BNP, PROBNP in the last 168 hours.   DDimer No results for input(s): DDIMER in the last 168 hours.   Radiology    No results found.  Cardiac Studies   CATH: 06/23/2018  Mid LM to Dist LM lesion is 40% stenosed.  Ost LAD lesion is 70% stenosed.  Prox LAD lesion is 80% stenosed.  Dist LAD lesion is 100% stenosed.  Mid RCA to Dist RCA lesion is 10% stenosed.  Dist RCA lesion is 50% stenosed.  Mid LAD lesion is 50% stenosed.  Prox LAD to Mid LAD lesion is 50% stenosed.  PA  pressure 30/16 mm Hg; mean PA 22 mm Hg; PCWP 12/12 PCWP 12 mm Hg; Ao sat 92%, PA sat 66 %; CO 8.1 L/min; CI 3.8  Severe LAD disease. It appears that the ostial LAD disease has worsened since the last cath and the mid LAD has also significantly worsened. THe mid LAd may be the culprit for his presentation.   Will discuss with colleagues, plan for PCI. Distal left main trifurcation with significant ostial LAD disease.  Restart heparin 8 hours post sheath pull.  Watch renal function. Post procedure hydration. Will have to watch for fluid overload.  Diagnostic Diagram         Per Dr. Darnelle Bos notes Cath/PCI:  LHC (12/02/13): LMCA with 30% distal stenosis. LAD with multiple stents and 50% ostial stenosis followed by 20% diffuse in-stent restenosis proximally. There is diffuse 50% in-stent restenosis in the mid and distal segments. The distal LAD is occluded. LCx is small with 40-50% ostial stenosis. OM 2 has a 50% ostial stenosis. Ramus intermedius is a large vessel with 70-80% stenosis involving its superior branch. 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 There is diffuse 20% proximal disease. There is a 90% stenosis involving the distal vessel just beyond the anastomosis with the RIMA. 60% stenosis extending into the RPDA is also evident. RIMA to RCA is widely patent with aforementioned stenosis just beyond the distal anastomosis.  PCI (12/03/13): Successful PCI to the mid/distal RCA with overlapping Promus drug-eluting stents (2.25 x 24 and 2.25 x 16 mm).  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     Patient Profile     57 y.o. male with history of known CAD, CKD stage 5 progressing towards renal transplant with fistula in place, hypertension, chronic angina who presents after having chest pain during a stress test, with report of stress test being significant abnormal.   Assessment & Plan    Chronic  angina with increase and abnormal stress test.  -per Dr. Fletcher Anon and Dr. Irish Lack  --plan to hold off PCI and treat medically now.   --if symptoms increase a PCI would require atherectomy due to calcifications  and also pacing the stent all the way back to the left main coronary artery with subsequent jailing of the large ramus branch and left circumflex.  Given that the LAD territory is partially infarcted and there  --neg MI --ranexa increased will check with pharmacy at dose with CKD 4  --500 mg BID is recommended dose  wil defer to Dr. Margaretann Loveless --ambulate with cardiac rehab.  CAD and hx of CABG with cath and abnormal stress test see above.   CRD 4 -nephrology following Cr today not yet back, has been drawn.  DM per IM   HLD on lipitor 80 mg. continue         For questions or updates, please contact [place] Please consult [email or web address] for contact info under        Signed, Cecilie Kicks, NP  06/26/2018, 6:46 AM    ---------------------------------------------------------------------------------   History and all data above reviewed.  Patient examined.  I agree with the findings as above.  Matthew Kirks Sr. is wearing his street clothes and is ready to leave the hospital.  Gen: NAD CV: regular rhythm Lungs: clear Extremities: warm, well perfused.  All available labs, radiology testing, previous records reviewed. Agree with documented assessment and plan of my colleague as stated above with the following additions or changes:  Principal Problem:   Unstable angina ([place]) Active Problems:   Hypertension   Type 2 diabetes with nephropathy (HCC)   CAD- S/P multiple PCIs, CABG   Gout   HLD (hyperlipidemia)   CKD (chronic kidney disease) stage 4, GFR 15-29 ml/min (HCC)   Chronic anemia   Chronic diastolic heart failure (HCC)   Leukocytosis   Chronic pain   Insomnia   Acute respiratory failure (HCC)   Acute diastolic (congestive) heart failure (HCC)    Coronary artery disease involving native heart with unstable angina pectoris ([place])    Plan: 1) unstable angina - defer PCI at this time per Dr. Fletcher Anon, he has discussed this in detail with the patient.  -Will discontinue heparin, nitro infusion -attempted to increase ranexa however renal failure prohibits this. Will continue with 500 mg BID. - restarted home amlodipine today for further antianginal effect.  2) Renal insufficiency 3) Congestive Heart Failure acute HFPEF - continue home lasix, creatinine stable.  [facility] will sign off.   Medication Recommendations: continue home meds, restarted amlodipine 10 mg daily. Other recommendations (labs, testing, etc):  n/a Follow up as an outpatient: appt with Dr. Irish Lack 08/28/18.

## 2018-07-07 ENCOUNTER — Encounter: Payer: Medicare HMO | Admitting: Surgery

## 2018-07-07 ENCOUNTER — Encounter (HOSPITAL_COMMUNITY): Payer: Medicare HMO

## 2018-07-09 DIAGNOSIS — I5032 Chronic diastolic (congestive) heart failure: Secondary | ICD-10-CM | POA: Diagnosis not present

## 2018-07-09 DIAGNOSIS — I10 Essential (primary) hypertension: Secondary | ICD-10-CM | POA: Diagnosis not present

## 2018-07-09 DIAGNOSIS — Z23 Encounter for immunization: Secondary | ICD-10-CM | POA: Diagnosis not present

## 2018-07-09 DIAGNOSIS — E1159 Type 2 diabetes mellitus with other circulatory complications: Secondary | ICD-10-CM | POA: Diagnosis not present

## 2018-07-09 DIAGNOSIS — I2510 Atherosclerotic heart disease of native coronary artery without angina pectoris: Secondary | ICD-10-CM | POA: Diagnosis not present

## 2018-07-09 DIAGNOSIS — N184 Chronic kidney disease, stage 4 (severe): Secondary | ICD-10-CM | POA: Diagnosis not present

## 2018-07-14 ENCOUNTER — Ambulatory Visit (HOSPITAL_COMMUNITY)
Admission: RE | Admit: 2018-07-14 | Discharge: 2018-07-14 | Disposition: A | Discharge status: Home / Self Care | Payer: Medicare HMO | Source: Ambulatory Visit | Attending: Surgery | Admitting: Surgery

## 2018-07-14 ENCOUNTER — Other Ambulatory Visit: Payer: Self-pay | Admitting: *Deleted

## 2018-07-14 ENCOUNTER — Encounter: Payer: Self-pay | Admitting: *Deleted

## 2018-07-14 ENCOUNTER — Encounter: Payer: Self-pay | Admitting: Surgery

## 2018-07-14 ENCOUNTER — Other Ambulatory Visit: Payer: Self-pay

## 2018-07-14 ENCOUNTER — Ambulatory Visit (INDEPENDENT_AMBULATORY_CARE_PROVIDER_SITE_OTHER): Payer: Self-pay | Admitting: Surgery

## 2018-07-14 VITALS — BP 128/77 | HR 88 | Temp 97.0°F | Resp 20 | Ht 65.0 in | Wt 230.0 lb

## 2018-07-14 DIAGNOSIS — N185 Chronic kidney disease, stage 5: Secondary | ICD-10-CM

## 2018-07-14 NOTE — Progress Notes (Signed)
Patient name: Matthew HODAPP Sr. MRN: [id number] DOB: Jan 26, 1961 Sex: male  REASON FOR VISIT:     post op  HISTORY OF PRESENT ILLNESS:   Matthew Hillyard. is a 57 y.o. male with stage V chronic renal insufficiency.  On 05/29/2018 he underwent a Thursdays left side vein fistula.  He is here today for follow-up.  He has some numbness on his forearm but no evidence of steal syndrome.  CURRENT MEDICATIONS:    Current Outpatient Medications  Medication Sig Dispense Refill  . acetaminophen (TYLENOL) 500 MG tablet Take 500 mg by mouth 3 (three) times daily as needed (for pain or headaches).     Marland Kitchen allopurinol (ZYLOPRIM) 100 MG tablet Take 100 mg by mouth daily.     Marland Kitchen amLODipine (NORVASC) 10 MG tablet Take 1 tablet (10 mg total) by mouth daily. 90 tablet 3  . aspirin EC 81 MG EC tablet Take 1 tablet (81 mg total) by mouth daily.    Marland Kitchen atorvastatin (LIPITOR) 80 MG tablet Take 1 tablet (80 mg total) by mouth daily. 90 tablet 1  . carvedilol (COREG) 25 MG tablet Take 2 tablets (50 mg total) by mouth 2 (two) times daily. 360 tablet 1  . Cholecalciferol 2000 units TABS Take 2,000 Units by mouth daily.     . clopidogrel (PLAVIX) 75 MG tablet TAKE ONE TABLET BY MOUTH ONCE DAILY WITH BREAKFAST (Patient taking differently: Take 75 mg by mouth daily. ) 90 tablet 1  . diphenhydrAMINE (BENADRYL) 25 MG tablet Take 25 mg by mouth every 6 (six) hours as needed for allergies or sleep.     . furosemide (LASIX) 80 MG tablet Take 1 tablet (80 mg total) by mouth 2 (two) times daily. 60 tablet 0  . gabapentin (NEURONTIN) 300 MG capsule Take 1 capsule (300 mg total) by mouth 2 (two) times daily. 60 capsule 0  . hydrALAZINE (APRESOLINE) 50 MG tablet Take 1 tablet (50 mg total) by mouth 3 (three) times daily. 270 tablet 3  . Insulin NPH Isophane & Regular (NOVOLIN 70/30 [place]) Inject 20 Units into the skin 2 (two) times daily.     . isosorbide mononitrate (IMDUR) 120 MG 24 hr  tablet Take 1 tablet (120 mg total) by mouth 2 (two) times daily.    . Misc. Devices MISC by Does not apply route. C-PAP    . Multiple Vitamin (MULTIVITAMIN WITH MINERALS) TABS tablet Take 1 tablet by mouth daily.    . nitroGLYCERIN (NITROSTAT) 0.4 MG SL tablet Place 1 tablet (0.4 mg total) under the tongue every 5 (five) minutes as needed for chest pain. 25 tablet 5  . pantoprazole (PROTONIX) 40 MG tablet Take 40 mg by mouth daily.     . ranitidine (ZANTAC) 300 MG tablet Take 300 mg by mouth at bedtime.    . ranolazine (RANEXA) 500 MG 12 hr tablet Take 1 tablet (500 mg total) by mouth 2 (two) times daily. 180 tablet 3  . Semaglutide (OZEMPIC [place]) Inject 2.5 Units into the skin every Saturday.     . traMADol (ULTRAM) 50 MG tablet Take 1 tablet (50 mg total) by mouth every 12 (twelve) hours as needed for moderate pain. 12 tablet 0  . vitamin E 100 UNIT capsule Take 400 Units by mouth daily.    Marland Kitchen  zolpidem (AMBIEN) 10 MG tablet Take 10 mg by mouth at bedtime as needed for sleep.      No current facility-administered medications for this visit.     REVIEW OF SYSTEMS:   [X]  denotes positive finding, [ ]  denotes negative finding Cardiac  Comments:  Chest pain or chest pressure:    Shortness of breath upon exertion:    Short of breath when lying flat:    Irregular heart rhythm:    Constitutional    Fever or chills:      PHYSICAL EXAM:   Vitals:   07/14/18 1601  BP: 128/77  Pulse: 88  Resp: 20  Temp: (!) 97 F (36.1 C)  TempSrc: Oral  SpO2: 97%  Weight: 230 lb (104.3 kg)  Height: 5\' 5"  (1.651 m)    GENERAL: The patient is a well-nourished male, in no acute distress. The vital signs are documented above. CARDIOVASCULAR: There is a regular rate and rhythm. PULMONARY: Non-labored respirations Palpable thrill within the fistula  STUDIES:   Vein mapping indicates a vein measuring 0.53-0.66 cm.   MEDICAL ISSUES:   Patient be scheduled for a second stage left basilic vein  fistula elevation on Thursday, November 14 he will stop his Plavix 1 week prior to his operation.  The risks and benefits the operation were discussed with the patient all questions were answered.  Annamarie Major, MD [facility] [phone] Pager [phone]

## 2018-07-14 NOTE — H&P (View-Only) (Signed)
Patient name: Matthew CASALE Sr. MRN: [id number] DOB: 06/20/1961 Sex: male  REASON FOR VISIT:     post op  HISTORY OF PRESENT ILLNESS:   Matthew Eskew. is a 57 y.o. male with stage V chronic renal insufficiency.  On 05/29/2018 he underwent a Thursdays left side vein fistula.  He is here today for follow-up.  He has some numbness on his forearm but no evidence of steal syndrome.  CURRENT MEDICATIONS:    Current Outpatient Medications  Medication Sig Dispense Refill  . acetaminophen (TYLENOL) 500 MG tablet Take 500 mg by mouth 3 (three) times daily as needed (for pain or headaches).     Marland Kitchen allopurinol (ZYLOPRIM) 100 MG tablet Take 100 mg by mouth daily.     Marland Kitchen amLODipine (NORVASC) 10 MG tablet Take 1 tablet (10 mg total) by mouth daily. 90 tablet 3  . aspirin EC 81 MG EC tablet Take 1 tablet (81 mg total) by mouth daily.    Marland Kitchen atorvastatin (LIPITOR) 80 MG tablet Take 1 tablet (80 mg total) by mouth daily. 90 tablet 1  . carvedilol (COREG) 25 MG tablet Take 2 tablets (50 mg total) by mouth 2 (two) times daily. 360 tablet 1  . Cholecalciferol 2000 units TABS Take 2,000 Units by mouth daily.     . clopidogrel (PLAVIX) 75 MG tablet TAKE ONE TABLET BY MOUTH ONCE DAILY WITH BREAKFAST (Patient taking differently: Take 75 mg by mouth daily. ) 90 tablet 1  . diphenhydrAMINE (BENADRYL) 25 MG tablet Take 25 mg by mouth every 6 (six) hours as needed for allergies or sleep.     . furosemide (LASIX) 80 MG tablet Take 1 tablet (80 mg total) by mouth 2 (two) times daily. 60 tablet 0  . gabapentin (NEURONTIN) 300 MG capsule Take 1 capsule (300 mg total) by mouth 2 (two) times daily. 60 capsule 0  . hydrALAZINE (APRESOLINE) 50 MG tablet Take 1 tablet (50 mg total) by mouth 3 (three) times daily. 270 tablet 3  . Insulin NPH Isophane & Regular (NOVOLIN 70/30 [place]) Inject 20 Units into the skin 2 (two) times daily.     . isosorbide mononitrate (IMDUR) 120 MG 24 hr  tablet Take 1 tablet (120 mg total) by mouth 2 (two) times daily.    . Misc. Devices MISC by Does not apply route. C-PAP    . Multiple Vitamin (MULTIVITAMIN WITH MINERALS) TABS tablet Take 1 tablet by mouth daily.    . nitroGLYCERIN (NITROSTAT) 0.4 MG SL tablet Place 1 tablet (0.4 mg total) under the tongue every 5 (five) minutes as needed for chest pain. 25 tablet 5  . pantoprazole (PROTONIX) 40 MG tablet Take 40 mg by mouth daily.     . ranitidine (ZANTAC) 300 MG tablet Take 300 mg by mouth at bedtime.    . ranolazine (RANEXA) 500 MG 12 hr tablet Take 1 tablet (500 mg total) by mouth 2 (two) times daily. 180 tablet 3  . Semaglutide (OZEMPIC [place]) Inject 2.5 Units into the skin every Saturday.     . traMADol (ULTRAM) 50 MG tablet Take 1 tablet (50 mg total) by mouth every 12 (twelve) hours as needed for moderate pain. 12 tablet 0  . vitamin E 100 UNIT capsule Take 400 Units by mouth daily.    Marland Kitchen  zolpidem (AMBIEN) 10 MG tablet Take 10 mg by mouth at bedtime as needed for sleep.      No current facility-administered medications for this visit.     REVIEW OF SYSTEMS:   [X]  denotes positive finding, [ ]  denotes negative finding Cardiac  Comments:  Chest pain or chest pressure:    Shortness of breath upon exertion:    Short of breath when lying flat:    Irregular heart rhythm:    Constitutional    Fever or chills:      PHYSICAL EXAM:   Vitals:   07/14/18 1601  BP: 128/77  Pulse: 88  Resp: 20  Temp: (!) 97 F (36.1 C)  TempSrc: Oral  SpO2: 97%  Weight: 230 lb (104.3 kg)  Height: 5\' 5"  (1.651 m)    GENERAL: The patient is a well-nourished male, in no acute distress. The vital signs are documented above. CARDIOVASCULAR: There is a regular rate and rhythm. PULMONARY: Non-labored respirations Palpable thrill within the fistula  STUDIES:   Vein mapping indicates a vein measuring 0.53-0.66 cm.   MEDICAL ISSUES:   Patient be scheduled for a second stage left basilic vein  fistula elevation on Thursday, November 14 he will stop his Plavix 1 week prior to his operation.  The risks and benefits the operation were discussed with the patient all questions were answered.  Annamarie Major, MD [facility] [phone] Pager [phone]

## 2018-07-22 ENCOUNTER — Encounter (HOSPITAL_COMMUNITY): Payer: Self-pay | Admitting: *Deleted

## 2018-07-22 ENCOUNTER — Other Ambulatory Visit: Payer: Self-pay

## 2018-07-22 NOTE — Progress Notes (Addendum)
Matthew Rowe has history of MI's, stents, CABG,CHF,. CKD, DM, HTN. Patient was admitted to [facility] with angina  06/17/2018. Patient was instructed to follow up with cardiologist, he has not seen a cardiologist since discharge.  Patient was seen by PCP Dr Coletta Memos and a PCP at [facility] in [place]. Matthew Rowe continues to experience chest pain, "angina".  The last time was 2 days ago, it last around 1  Hour and pain was decreased with NTG spray.  Patient denies lightheadedness or N/V with epsiode.  When I asked for more description of chest pain, patient said, "its the angina I have and my Drs are aware."  Patient's wife instructed patient to tell me that he had chest pain in the PACU after OR 05/29/18.  Patient has Type II diabetes, CBGs run in the 100's.  I instructed patient to take 14 units of 70/30 Insulin on Wednesday evening and no Insulin the morning of surgery.  I instructed patient to check CBG after awaking and every 2 hours until arrival  to the hospital.  I Instructed patient if CBG is less than 70 to drink or 1/2 cup of a clear juice. Recheck CBG in 15 minutes then call pre- op desk at [phone] for further instructions. If scheduled to receive Insulin, do not take Insulin

## 2018-07-23 ENCOUNTER — Other Ambulatory Visit: Payer: Self-pay | Admitting: *Deleted

## 2018-07-23 NOTE — Progress Notes (Signed)
Anesthesia Chart Review: SAME DAY WORKUP   Case:  [id number] Date/Time:  07/24/18 0910   Procedure:  SECOND STAGE BASCILIC VEIN TRANSPOSITION LEFT ARM (Bilateral )   Anesthesia type:  Monitor Anesthesia Care   Pre-op diagnosis:  CHRONIC KIDNEY DISEASE FOR HEMODIALYSIS ACCESS   Location:  [facility] OR ROOM 11 / [facility]   Surgeon:  Serafina [facility], MD      DISCUSSION: 57 yo male former smoker. Pertinent hx includes CAD s/p CABG and multiple PCI with chronic angina, OSA on CPAP, Ischemic cardiomyopathy, chronic HFpEF, T2DM, HTN, and stage IV CKD   Admitted 10/10-10/17/2019 for eval of chest pain that developed during stress test at [facility]. Test was preoperative workup for potential renal transplantation. With wall motion abnormalities on echo (Hypokinesis of the apical inferior, mid-apical inferoseptal, and mid-apical anteroseptal myocardium. Apical akinesis.) and severe LAD disease on LHC. Stenting was initially recommended but after long discussion was deferred. Dr. Tyrell Antonio note explains thought process:   "I was asked to evaluate the patient for possible PCI of the LAD. I reviewed the patient's history and imaging. The patient was transferred due to abnormal stress test as he is being evaluated for kidney transplant. He did not present with unstable angina. He does report worsening stable angina over the last few months which has required him to take nitroglycerin more frequently than his usual. I reviewed the cardiac catheterization images with Dr. Irish Lack. The patient has a patent RIMA to RCA with patent distal RCA stent. The left main is patent but there is significant ostial LAD stenosis as well as significant in-stent restenosis in the proximal to mid segment of the LAD. The mid to distal LAD is occluded and this is a chronic finding. The LAD originates at a steep angle from the left main and there is a large ramus branch in addition to the left circumflex. PCI of the LAD requires  atherectomy due to calcifications and also pacing the stent all the way back to the left main coronary artery with subsequent jailing of the large ramus branch and left circumflex. Given that the LAD territory is partially infarcted and there are no diagonal branches, I think the risks outweighs the benefits. Echocardiogram showed normal LV systolic function with hypokinesis of the mid to distal anteroseptal anterior myocardium as well as apical akinesis.  I recommend intensifying his antianginal therapy and reserving PCI for more refractory symptoms"  S/p multiple MI, PCI, and CABG. Chronic stable angina with recent unstable angina due to stress test, medical therapy maximized. Will need DOS eval by assigned anesthesiologist. Anticipate can proceed if asymptomatic and labs acceptable.  VS: There were no vitals taken for this visit.  PROVIDERS: [name], MD   LABS: Will need DOS labs   Labs Reviewed - No data to display   IMAGES: PORTABLE CHEST 1 VIEW 06/22/2018  COMPARISON:  Chest x-rays dated 06/21/2018 and 06/20/2018.  FINDINGS: Stable cardiomegaly. Improved aeration within the RIGHT lung, compatible with resolving edema or resolving pneumonia. No new lung abnormality. No pleural effusion or pneumothorax seen.  IMPRESSION: Improved aeration of the RIGHT lung, suggesting improved fluid status and/or resolving pneumonia.   EKG: 06/20/2018: Sinus tachycardia. Rate 108. Left axis deviation. Septal infarct , age undetermined. Possible Lateral infarct , age undetermined  CV: Cath 06/23/2018:  Mid LM to Dist LM lesion is 40% stenosed.  Ost LAD lesion is 70% stenosed.  Prox LAD lesion is 80% stenosed.  Dist LAD lesion is 100% stenosed.  Mid  RCA to Dist RCA lesion is 10% stenosed.  Dist RCA lesion is 50% stenosed.  Mid LAD lesion is 50% stenosed.  Prox LAD to Mid LAD lesion is 50% stenosed.  PA pressure 30/16 mm Hg; mean PA 22 mm Hg; PCWP 12/12 PCWP 12 mm  Hg; Ao sat 92%, PA sat 66 %; CO 8.1 L/min; CI 3.8   Severe LAD disease.  It appears that the ostial LAD disease has worsened since the last cath and the mid LAD has also significantly worsened.  THe mid LAd may be the culprit for his presentation.    Will discuss with colleagues, plan for PCI.  Distal left main trifurcation with significant ostial LAD disease.  Restart heparin 8 hours post sheath pull.  Watch renal function.  Post procedure hydration.  Will have to watch for fluid overload.   TTE 06/21/2018: Study Conclusions  - Left ventricle: The cavity size was normal. There was mild   concentric hypertrophy. Systolic function was normal. The   estimated ejection fraction was in the range of 55% to 60%.   Hypokinesis of the anteroseptal and inferoseptal myocardium.   Doppler parameters are consistent with abnormal left ventricular   relaxation (grade 1 diastolic dysfunction). - Aortic valve: Valve mobility was restricted. There was mild   stenosis. Peak velocity (S): 267 cm/s. Mean gradient (S): 17 mm   Hg. Valve area (VTI): 1.27 cm^2. Valve area (Vmax): 1.29 cm^2.   Valve area (Vmean): 1.24 cm^2. - Mitral valve: Transvalvular velocity was within the normal range.   There was no evidence for stenosis. There was trivial   regurgitation. - Right ventricle: The cavity size was normal. Wall thickness was   normal. Systolic function was normal. - Atrial septum: No defect or patent foramen ovale was identified   by color flow Doppler. - Tricuspid valve: There was trivial regurgitation. - Pulmonary arteries: Systolic pressure was within the normal   range. PA peak pressure: 26 mm Hg (S). - Pericardium, extracardiac: A trivial pericardial effusion was   identified.   Past Medical History:  Diagnosis Date  . Acute on chronic combined systolic and diastolic CHF (congestive heart failure) ([place]) 10/15/2017  . Chronic anemia   . Chronic combined systolic and diastolic CHF  (congestive heart failure) ([place])   . Chronic stable angina ([place])   . CKD (chronic kidney disease), stage IV ([place])   . Coronary artery disease    a. INF MI 1999: PCI of RCA;  b.  extensive stenting of LAD;  c. s/p CABG with RIMA->RCA in 2006;  d. Stable, Low risk MV 05/2012; e. 09/2012 Cath: stable anatomy->med Rx. f. Abnl nuc 11/2013 -> med rx; g. 11/2013 Cath/PCI: s/p DES to RCA. h. Abnormal nuc 11/2016, pt declined cath.   . DM2 (diabetes mellitus, type 2) ([facility])    Type II  . Dyslipidemia   . GERD (gastroesophageal reflux disease)   . History of gout   . Hypertension   . Hypertensive heart disease   . Ischemic cardiomyopathy   . Mild aortic stenosis   . Myocardial infarction [facility]) 1999; 2002; 2003; 2006; ~ 2008  . OSA on CPAP    wears CPAP  . Polycystic kidney disease     Past Surgical History:  Procedure Laterality Date  . AV FISTULA PLACEMENT Left 05/29/2018   Procedure: ARTERIOVENOUS (AV) FISTULA CREATION  LEFT ARM.;  Surgeon: Serafina [facility], MD;  Location: [place];  Service: Vascular;  Laterality: Left;  . CARDIAC CATHETERIZATION  2012  .  CARDIAC CATHETERIZATION  2013  . CARDIAC CATHETERIZATION  2014   LHC (1/14): total occlusion of distal LAD, diffuse disease in ramus, 70% proximal LCx, 50% proximal and mid RCA, 50% distal RCA, patent RIMA-RCA.  Marland Kitchen CORONARY ANGIOPLASTY WITH STENT PLACEMENT  1999 / 2002 / 2004 / 2006?  . CORONARY ARTERY BYPASS GRAFT  2006   CABG X?; in [place]  . CYSTECTOMY  1990's   off face  . HERNIA REPAIR     inguinal hernia  . LEFT HEART CATHETERIZATION WITH CORONARY/GRAFT ANGIOGRAM N/A 09/16/2012   Procedure: LEFT HEART CATHETERIZATION WITH [name];  Surgeon: [name], MD;  Location: [facility] CATH LAB;  Service: Cardiovascular;  Laterality: N/A;  . LEFT HEART CATHETERIZATION WITH CORONARY/GRAFT ANGIOGRAM N/A 12/02/2013   Procedure: LEFT HEART CATHETERIZATION WITH [name];  Surgeon: [name], MD;  Location: [facility] CATH  LAB;  Service: Cardiovascular;  Laterality: N/A;  . PERCUTANEOUS CORONARY STENT INTERVENTION (PCI-S) N/A 12/03/2013   Procedure: PERCUTANEOUS CORONARY STENT INTERVENTION (PCI-S);  Surgeon: [name], MD;  Location: [facility] CATH LAB;  Service: Cardiovascular;  Laterality: N/A;  . RIGHT/LEFT HEART CATH AND CORONARY/GRAFT ANGIOGRAPHY N/A 06/23/2018   Procedure: RIGHT/LEFT HEART CATH AND CORONARY/GRAFT ANGIOGRAPHY;  Surgeon: [name], MD;  Location: [place] CV LAB;  Service: Cardiovascular;  Laterality: N/A;    MEDICATIONS: No current facility-administered medications for this encounter.    Marland Kitchen acetaminophen (TYLENOL) 500 MG tablet  . allopurinol (ZYLOPRIM) 100 MG tablet  . amLODipine (NORVASC) 10 MG tablet  . aspirin EC 81 MG EC tablet  . atorvastatin (LIPITOR) 80 MG tablet  . carvedilol (COREG) 25 MG tablet  . Cholecalciferol 2000 units TABS  . clopidogrel (PLAVIX) 75 MG tablet  . diphenhydrAMINE (BENADRYL) 25 MG tablet  . furosemide (LASIX) 80 MG tablet  . gabapentin (NEURONTIN) 300 MG capsule  . hydrALAZINE (APRESOLINE) 50 MG tablet  . insulin aspart protamine- aspart (NOVOLOG MIX 70/30) (70-30) 100 UNIT/ML injection  . isosorbide mononitrate (IMDUR) 120 MG 24 hr tablet  . methylPREDNISolone (MEDROL) 4 MG tablet  . Multiple Vitamin (MULTIVITAMIN WITH MINERALS) TABS tablet  . nitroGLYCERIN (NITROLINGUAL) 0.4 MG/SPRAY spray  . nitroGLYCERIN (NITROSTAT) 0.4 MG SL tablet  . pantoprazole (PROTONIX) 40 MG tablet  . ranitidine (ZANTAC) 300 MG tablet  . ranolazine (RANEXA) 500 MG 12 hr tablet  . Semaglutide (OZEMPIC, 0.25 OR 0.5 MG/DOSE, [place])  . traMADol (ULTRAM) 50 MG tablet  . vitamin E 400 UNIT capsule  . zolpidem (AMBIEN) 10 MG tablet  . Misc. [place], PA-C [facility] Short Stay Center/Anesthesiology Phone [phone] 07/23/2018 9:36 AM

## 2018-07-23 NOTE — Anesthesia Preprocedure Evaluation (Addendum)
Anesthesia Evaluation  Patient identified by MRN, date of birth, ID band Patient awake    Airway Mallampati: II  TM Distance: >3 FB Neck ROM: full    Dental  (+) Teeth Intact, Dental Advidsory Given   Pulmonary sleep apnea and Continuous Positive Airway Pressure Ventilation , former smoker,    Pulmonary exam normal        Cardiovascular hypertension, Pt. on medications + CAD and + Past MI  Normal cardiovascular exam     Neuro/Psych    GI/Hepatic GERD  Medicated and Controlled,  Endo/Other  diabetes, Type 2, Insulin Dependent  Renal/GU ESRFRenal disease     Musculoskeletal   Abdominal   Peds  Hematology  (+) Blood dyscrasia, anemia ,   Anesthesia Other Findings - Left ventricle: The cavity size was normal. The estimated   ejection fraction was in the range of 55% to 60%. Hypokinesis of   the apical inferior, mid-apical inferoseptal, and mid-apical   anteroseptal myocardium. Apical akinesis  Reproductive/Obstetrics                           Anesthesia Physical Anesthesia Plan  ASA: III  Anesthesia Plan: General   Post-op Pain Management:    Induction: Intravenous  PONV Risk Score and Plan: 2  Airway Management Planned: LMA  Additional Equipment:   Intra-op Plan:   Post-operative Plan: Extubation in OR  Informed Consent: I have reviewed the patients History and Physical, chart, labs and discussed the procedure including the risks, benefits and alternatives for the proposed anesthesia with the patient or authorized representative who has indicated his/her understanding and acceptance.   Dental Advisory Given  Plan Discussed with: CRNA and Surgeon  Anesthesia Plan Comments: (See PAT note 07/23/2018 by Karoline Caldwell, PA-C )      Anesthesia Quick Evaluation

## 2018-07-24 ENCOUNTER — Ambulatory Visit (HOSPITAL_COMMUNITY)
Admission: RE | Admit: 2018-07-24 | Discharge: 2018-07-24 | Disposition: A | Discharge status: Home / Self Care | Payer: Medicare HMO | Source: Ambulatory Visit | Attending: Surgery | Admitting: Surgery

## 2018-07-24 ENCOUNTER — Ambulatory Visit (HOSPITAL_COMMUNITY): Payer: Medicare HMO | Admitting: Physician Assistant

## 2018-07-24 ENCOUNTER — Encounter (HOSPITAL_COMMUNITY): Payer: Self-pay

## 2018-07-24 ENCOUNTER — Encounter (HOSPITAL_COMMUNITY)
Admission: RE | Disposition: A | Discharge status: Home / Self Care | Payer: Self-pay | Source: Ambulatory Visit | Attending: Surgery

## 2018-07-24 ENCOUNTER — Other Ambulatory Visit: Payer: Self-pay

## 2018-07-24 DIAGNOSIS — Z9989 Dependence on other enabling machines and devices: Secondary | ICD-10-CM | POA: Insufficient documentation

## 2018-07-24 DIAGNOSIS — Z951 Presence of aortocoronary bypass graft: Secondary | ICD-10-CM | POA: Insufficient documentation

## 2018-07-24 DIAGNOSIS — I2510 Atherosclerotic heart disease of native coronary artery without angina pectoris: Secondary | ICD-10-CM | POA: Insufficient documentation

## 2018-07-24 DIAGNOSIS — Z7982 Long term (current) use of aspirin: Secondary | ICD-10-CM | POA: Insufficient documentation

## 2018-07-24 DIAGNOSIS — Z7902 Long term (current) use of antithrombotics/antiplatelets: Secondary | ICD-10-CM | POA: Insufficient documentation

## 2018-07-24 DIAGNOSIS — Q613 Polycystic kidney, unspecified: Secondary | ICD-10-CM | POA: Diagnosis not present

## 2018-07-24 DIAGNOSIS — Z79899 Other long term (current) drug therapy: Secondary | ICD-10-CM | POA: Diagnosis not present

## 2018-07-24 DIAGNOSIS — E785 Hyperlipidemia, unspecified: Secondary | ICD-10-CM | POA: Diagnosis not present

## 2018-07-24 DIAGNOSIS — I132 Hypertensive heart and chronic kidney disease with heart failure and with stage 5 chronic kidney disease, or end stage renal disease: Secondary | ICD-10-CM | POA: Diagnosis not present

## 2018-07-24 DIAGNOSIS — Z87891 Personal history of nicotine dependence: Secondary | ICD-10-CM | POA: Diagnosis not present

## 2018-07-24 DIAGNOSIS — E1122 Type 2 diabetes mellitus with diabetic chronic kidney disease: Secondary | ICD-10-CM | POA: Insufficient documentation

## 2018-07-24 DIAGNOSIS — I252 Old myocardial infarction: Secondary | ICD-10-CM | POA: Insufficient documentation

## 2018-07-24 DIAGNOSIS — I5042 Chronic combined systolic (congestive) and diastolic (congestive) heart failure: Secondary | ICD-10-CM | POA: Diagnosis not present

## 2018-07-24 DIAGNOSIS — Z794 Long term (current) use of insulin: Secondary | ICD-10-CM | POA: Insufficient documentation

## 2018-07-24 DIAGNOSIS — N185 Chronic kidney disease, stage 5: Secondary | ICD-10-CM | POA: Insufficient documentation

## 2018-07-24 DIAGNOSIS — M109 Gout, unspecified: Secondary | ICD-10-CM | POA: Diagnosis not present

## 2018-07-24 DIAGNOSIS — K219 Gastro-esophageal reflux disease without esophagitis: Secondary | ICD-10-CM | POA: Insufficient documentation

## 2018-07-24 DIAGNOSIS — G4733 Obstructive sleep apnea (adult) (pediatric): Secondary | ICD-10-CM | POA: Diagnosis not present

## 2018-07-24 DIAGNOSIS — Z955 Presence of coronary angioplasty implant and graft: Secondary | ICD-10-CM | POA: Diagnosis not present

## 2018-07-24 DIAGNOSIS — I120 Hypertensive chronic kidney disease with stage 5 chronic kidney disease or end stage renal disease: Secondary | ICD-10-CM | POA: Diagnosis not present

## 2018-07-24 DIAGNOSIS — N186 End stage renal disease: Secondary | ICD-10-CM | POA: Diagnosis not present

## 2018-07-24 HISTORY — PX: BASCILIC VEIN TRANSPOSITION: SHX5742

## 2018-07-24 LAB — GLUCOSE, CAPILLARY
GLUCOSE-CAPILLARY: 135 mg/dL — AB (ref 70–99)
Glucose-Capillary: 117 mg/dL — ABNORMAL HIGH (ref 70–99)
Glucose-Capillary: 105 mg/dL — ABNORMAL HIGH (ref 70–99)

## 2018-07-24 LAB — POCT I-STAT 4, (NA,K, GLUC, HGB,HCT)
Glucose, Bld: 136 mg/dL — ABNORMAL HIGH (ref 70–99)
HCT: 28 % — ABNORMAL LOW (ref 39.0–52.0)
HEMOGLOBIN: 9.5 g/dL — AB (ref 13.0–17.0)
POTASSIUM: 3.7 mmol/L (ref 3.5–5.1)
SODIUM: 142 mmol/L (ref 135–145)

## 2018-07-24 SURGERY — TRANSPOSITION, VEIN, BASILIC
Anesthesia: General | Site: Arm Upper | Laterality: Left

## 2018-07-24 MED ORDER — CHLORHEXIDINE GLUCONATE 4 % EX LIQD: 60.0000 mL | Freq: Once | CUTANEOUS | Status: DC

## 2018-07-24 MED ORDER — DEXAMETHASONE SODIUM PHOSPHATE 10 MG/ML IJ SOLN
INTRAMUSCULAR | Status: AC
  Filled 2018-07-24: qty 1

## 2018-07-24 MED ORDER — MIDAZOLAM HCL 5 MG/5ML IJ SOLN
INTRAMUSCULAR | Status: DC | PRN
  Administered 2018-07-24: 2 mg via INTRAVENOUS

## 2018-07-24 MED ORDER — HYDROCODONE-ACETAMINOPHEN 5-325 MG PO TABS
1.0000 | ORAL_TABLET | Freq: Once | ORAL | Status: AC
  Administered 2018-07-24: 1 via ORAL

## 2018-07-24 MED ORDER — CEFAZOLIN SODIUM-DEXTROSE 2-4 GM/100ML-% IV SOLN
2.0000 g | INTRAVENOUS | Status: AC
  Administered 2018-07-24: 2 g via INTRAVENOUS

## 2018-07-24 MED ORDER — SODIUM CHLORIDE 0.9 % IV SOLN
INTRAVENOUS | Status: DC
  Administered 2018-07-24 (×2): via INTRAVENOUS

## 2018-07-24 MED ORDER — FENTANYL CITRATE (PF) 250 MCG/5ML IJ SOLN
INTRAMUSCULAR | Status: AC
  Filled 2018-07-24: qty 5

## 2018-07-24 MED ORDER — SODIUM CHLORIDE 0.9 % IV SOLN
INTRAVENOUS | Status: AC
  Filled 2018-07-24: qty 1.2

## 2018-07-24 MED ORDER — MEPERIDINE HCL 50 MG/ML IJ SOLN: 6.2500 mg | INTRAMUSCULAR | Status: DC | PRN

## 2018-07-24 MED ORDER — EPHEDRINE SULFATE 50 MG/ML IJ SOLN
INTRAMUSCULAR | Status: DC | PRN
  Administered 2018-07-24: 5 mg via INTRAVENOUS

## 2018-07-24 MED ORDER — HYDROMORPHONE HCL 1 MG/ML IJ SOLN: 0.2500 mg | INTRAMUSCULAR | Status: DC | PRN

## 2018-07-24 MED ORDER — SODIUM CHLORIDE 0.9 % IV SOLN
INTRAVENOUS | Status: DC | PRN
  Administered 2018-07-24: 50 ug/min via INTRAVENOUS

## 2018-07-24 MED ORDER — HYDROCODONE-ACETAMINOPHEN 5-325 MG PO TABS: 1.0000 | ORAL_TABLET | Freq: Four times a day (QID) | ORAL | 0 refills | Status: DC | PRN

## 2018-07-24 MED ORDER — FENTANYL CITRATE (PF) 100 MCG/2ML IJ SOLN
INTRAMUSCULAR | Status: DC | PRN
  Administered 2018-07-24 (×5): 25 ug via INTRAVENOUS
  Administered 2018-07-24: 50 ug via INTRAVENOUS

## 2018-07-24 MED ORDER — MIDAZOLAM HCL 2 MG/2ML IJ SOLN
INTRAMUSCULAR | Status: AC
  Filled 2018-07-24: qty 2

## 2018-07-24 MED ORDER — ONDANSETRON HCL 4 MG/2ML IJ SOLN
INTRAMUSCULAR | Status: DC | PRN
  Administered 2018-07-24: 4 mg via INTRAVENOUS

## 2018-07-24 MED ORDER — HEMOSTATIC AGENTS (NO CHARGE) OPTIME
TOPICAL | Status: DC | PRN
  Administered 2018-07-24: 1 via TOPICAL

## 2018-07-24 MED ORDER — LIDOCAINE 2% (20 MG/ML) 5 ML SYRINGE
INTRAMUSCULAR | Status: DC | PRN
  Administered 2018-07-24: 100 mg via INTRAVENOUS

## 2018-07-24 MED ORDER — SODIUM CHLORIDE 0.9 % IV SOLN
INTRAVENOUS | Status: DC | PRN
  Administered 2018-07-24: 10:00:00

## 2018-07-24 MED ORDER — CEFAZOLIN SODIUM-DEXTROSE 2-4 GM/100ML-% IV SOLN
INTRAVENOUS | Status: AC
  Filled 2018-07-24: qty 100

## 2018-07-24 MED ORDER — ONDANSETRON HCL 4 MG/2ML IJ SOLN
INTRAMUSCULAR | Status: AC
  Filled 2018-07-24: qty 2

## 2018-07-24 MED ORDER — 0.9 % SODIUM CHLORIDE (POUR BTL) OPTIME
TOPICAL | Status: DC | PRN
  Administered 2018-07-24: 1000 mL

## 2018-07-24 MED ORDER — HYDROCODONE-ACETAMINOPHEN 5-325 MG PO TABS
ORAL_TABLET | ORAL | Status: AC
  Administered 2018-07-24: 1 via ORAL
  Filled 2018-07-24: qty 1

## 2018-07-24 MED ORDER — PROPOFOL 10 MG/ML IV BOLUS
INTRAVENOUS | Status: DC | PRN
  Administered 2018-07-24: 20 mg via INTRAVENOUS
  Administered 2018-07-24: 125 mg via INTRAVENOUS

## 2018-07-24 MED ORDER — ONDANSETRON HCL 4 MG/2ML IJ SOLN: 4.0000 mg | Freq: Once | INTRAMUSCULAR | Status: DC | PRN

## 2018-07-24 MED ORDER — PHENYLEPHRINE HCL 10 MG/ML IJ SOLN
INTRAMUSCULAR | Status: DC | PRN
  Administered 2018-07-24: 80 ug via INTRAVENOUS

## 2018-07-24 MED ORDER — LIDOCAINE 2% (20 MG/ML) 5 ML SYRINGE
INTRAMUSCULAR | Status: AC
  Filled 2018-07-24: qty 5

## 2018-07-24 SURGICAL SUPPLY — 41 items
ARMBAND PINK RESTRICT EXTREMIT (MISCELLANEOUS) ×3 IMPLANT
CANISTER SUCT 3000ML PPV (MISCELLANEOUS) ×3 IMPLANT
CLIP VESOCCLUDE MED 6/CT (CLIP) ×3 IMPLANT
CLIP VESOCCLUDE SM WIDE 6/CT (CLIP) ×3 IMPLANT
COVER PROBE W GEL 5X96 (DRAPES) ×3 IMPLANT
COVER WAND RF STERILE (DRAPES) ×3 IMPLANT
DERMABOND ADVANCED (GAUZE/BANDAGES/DRESSINGS) ×2
DERMABOND ADVANCED .7 DNX12 (GAUZE/BANDAGES/DRESSINGS) ×1 IMPLANT
ELECT REM PT RETURN 9FT ADLT (ELECTROSURGICAL) ×3
ELECTRODE REM PT RTRN 9FT ADLT (ELECTROSURGICAL) ×1 IMPLANT
GLOVE BIO SURGEON STRL SZ 6.5 (GLOVE) ×2 IMPLANT
GLOVE BIO SURGEON STRL SZ7.5 (GLOVE) ×3 IMPLANT
GLOVE BIO SURGEONS STRL SZ 6.5 (GLOVE) ×1
GLOVE BIOGEL PI IND STRL 6.5 (GLOVE) ×1 IMPLANT
GLOVE BIOGEL PI IND STRL 7.0 (GLOVE) ×2 IMPLANT
GLOVE BIOGEL PI IND STRL 7.5 (GLOVE) ×1 IMPLANT
GLOVE BIOGEL PI IND STRL 8 (GLOVE) ×1 IMPLANT
GLOVE BIOGEL PI INDICATOR 6.5 (GLOVE) ×2
GLOVE BIOGEL PI INDICATOR 7.0 (GLOVE) ×4
GLOVE BIOGEL PI INDICATOR 7.5 (GLOVE) ×2
GLOVE BIOGEL PI INDICATOR 8 (GLOVE) ×2
GLOVE SURG SS PI 7.5 STRL IVOR (GLOVE) ×3 IMPLANT
GOWN STRL REUS W/ TWL LRG LVL3 (GOWN DISPOSABLE) ×2 IMPLANT
GOWN STRL REUS W/ TWL XL LVL3 (GOWN DISPOSABLE) ×2 IMPLANT
GOWN STRL REUS W/TWL LRG LVL3 (GOWN DISPOSABLE) ×4
GOWN STRL REUS W/TWL XL LVL3 (GOWN DISPOSABLE) ×4
HEMOSTAT SNOW SURGICEL 2X4 (HEMOSTASIS) ×3 IMPLANT
KIT BASIN OR (CUSTOM PROCEDURE TRAY) ×3 IMPLANT
KIT TURNOVER KIT B (KITS) ×3 IMPLANT
NS IRRIG 1000ML POUR BTL (IV SOLUTION) ×3 IMPLANT
PACK CV ACCESS (CUSTOM PROCEDURE TRAY) ×3 IMPLANT
PAD ARMBOARD 7.5X6 YLW CONV (MISCELLANEOUS) ×6 IMPLANT
PENCIL BUTTON HOLSTER BLD 10FT (ELECTRODE) ×3 IMPLANT
SUT PROLENE 6 0 CC (SUTURE) ×3 IMPLANT
SUT SILK 2 0 SH (SUTURE) ×3 IMPLANT
SUT VIC AB 3-0 SH 27 (SUTURE) ×4
SUT VIC AB 3-0 SH 27X BRD (SUTURE) ×2 IMPLANT
SUT VICRYL 4-0 PS2 18IN ABS (SUTURE) ×6 IMPLANT
TOWEL GREEN STERILE (TOWEL DISPOSABLE) ×3 IMPLANT
UNDERPAD 30X30 (UNDERPADS AND DIAPERS) ×3 IMPLANT
WATER STERILE IRR 1000ML POUR (IV SOLUTION) ×3 IMPLANT

## 2018-07-24 NOTE — Interval H&P Note (Signed)
History and Physical Interval Note:  07/24/2018 7:26 AM  Matthew Rowe Sr.  has presented today for surgery, with the diagnosis of CHRONIC KIDNEY DISEASE FOR HEMODIALYSIS ACCESS  The various methods of treatment have been discussed with the patient and family. After consideration of risks, benefits and other options for treatment, the patient has consented to  Procedure(s): SECOND STAGE BASCILIC VEIN TRANSPOSITION LEFT ARM (Bilateral) as a surgical intervention .  The patient's history has been reviewed, patient examined, no change in status, stable for surgery.  I have reviewed the patient's chart and labs.  Questions were answered to the patient's satisfaction.     Annamarie Major

## 2018-07-24 NOTE — Discharge Instructions (Signed)
° °  [facility] ° °Discharge Instructions ° °AV Fistula or Graft Surgery for Dialysis Access ° °Please refer to the following instructions for your post-procedure care. Your surgeon or physician assistant will discuss any changes with you. ° °Activity ° °You may drive the day following your surgery, if you are comfortable and no longer taking prescription pain medication. Resume full activity as the soreness in your incision resolves. ° °Bathing/Showering ° °You may shower after you go home. Keep your incision dry for 48 hours. Do not soak in a bathtub, hot tub, or swim until the incision heals completely. You may not shower if you have a hemodialysis catheter. ° °Incision Care ° °Clean your incision with mild soap and water after 48 hours. Pat the area dry with a clean towel. You do not need a bandage unless otherwise instructed. Do not apply any ointments or creams to your incision. You may have skin glue on your incision. Do not peel it off. It will come off on its own in about one week. Your arm may swell a bit after surgery. To reduce swelling use pillows to elevate your arm so it is above your heart. Your doctor will tell you if you need to lightly wrap your arm with an ACE bandage. ° °Diet ° °Resume your normal diet. There are not special food restrictions following this procedure. In order to heal from your surgery, it is CRITICAL to get adequate nutrition. Your body requires vitamins, minerals, and protein. Vegetables are the best source of vitamins and minerals. Vegetables also provide the perfect balance of protein. Processed food has little nutritional value, so try to avoid this. ° °Medications ° °Resume taking all of your medications. If your incision is causing pain, you may take over-the counter pain relievers such as acetaminophen (Tylenol). If you were prescribed a stronger pain medication, please be aware these medications can cause nausea and constipation. Prevent  nausea by taking the medication with a snack or meal. Avoid constipation by drinking plenty of fluids and eating foods with high amount of fiber, such as fruits, vegetables, and grains. Do not take Tylenol if you are taking prescription pain medications. ° ° ° ° °Follow up °Your surgeon may want to see you in the office following your access surgery. If so, this will be arranged at the time of your surgery. ° °Please call us immediately for any of the following conditions: ° °Increased pain, redness, drainage (pus) from your incision site °Fever of 101 degrees or higher °Severe or worsening pain at your incision site °Hand pain or numbness. ° °Reduce your risk of vascular disease: ° °Stop smoking. If you would like help, call [facility] at 1-800-QUIT-NOW (1-800-784-8669) or [facility] at 336-586-4000 ° °Manage your cholesterol °Maintain a desired weight °Control your diabetes °Keep your blood pressure down ° °Dialysis ° °It will take several weeks to several months for your new dialysis access to be ready for use. Your surgeon will determine when it is OK to use it. Your nephrologist will continue to direct your dialysis. You can continue to use your Permcath until your new access is ready for use. ° °If you have any questions, please call the office at [phone]. ° °

## 2018-07-24 NOTE — Anesthesia Postprocedure Evaluation (Signed)
Anesthesia Post Note  Patient: Matthew SWOPES Sr.  Procedure(s) Performed: SECOND STAGE BASCILIC VEIN TRANSPOSITION LEFT ARM (Left Arm Upper)     Patient location during evaluation: PACU Anesthesia Type: General Level of consciousness: awake and alert Pain management: pain level controlled Vital Signs Assessment: post-procedure vital signs reviewed and stable Respiratory status: spontaneous breathing, nonlabored ventilation, respiratory function stable and patient connected to nasal cannula oxygen Cardiovascular status: blood pressure returned to baseline and stable Postop Assessment: no apparent nausea or vomiting Anesthetic complications: no    Last Vitals:  Vitals:   07/24/18 1225 07/24/18 1245  BP:  121/70  Pulse:  95  Resp:  16  Temp: 36.5 C   SpO2:  95%    Last Pain:  Vitals:   07/24/18 1225  TempSrc:   PainSc: 6                  Jacobe Study DAVID

## 2018-07-24 NOTE — Anesthesia Procedure Notes (Signed)
Procedure Name: LMA Insertion Date/Time: 07/24/2018 9:35 AM Performed by: Neldon Newport, CRNA Pre-anesthesia Checklist: Timeout performed, Patient being monitored, Suction available, Emergency Drugs available and Patient identified Patient Re-evaluated:Patient Re-evaluated prior to induction Oxygen Delivery Method: Circle system utilized Preoxygenation: Pre-oxygenation with 100% oxygen Induction Type: IV induction Ventilation: Mask ventilation without difficulty LMA: LMA inserted LMA Size: 5.0 Number of attempts: 1 Placement Confirmation: breath sounds checked- equal and bilateral and positive ETCO2 Tube secured with: Tape Dental Injury: Teeth and Oropharynx as per pre-operative assessment

## 2018-07-24 NOTE — Transfer of Care (Signed)
Immediate Anesthesia Transfer of Care Note  Patient: Matthew WILSON Sr.  Procedure(s) Performed: SECOND STAGE BASCILIC VEIN TRANSPOSITION LEFT ARM (Left Arm Upper)  Patient Location: PACU  Anesthesia Type:General  Level of Consciousness: awake, alert  and oriented  Airway & Oxygen Therapy: Patient Spontanous Breathing and Patient connected to nasal cannula oxygen  Post-op Assessment: Report given to RN, Post -op Vital signs reviewed and stable and Patient moving all extremities X 4  Post vital signs: Reviewed and stable  Last Vitals:  Vitals Value Taken Time  BP 123/75 07/24/2018 11:37 AM  Temp    Pulse 98 07/24/2018 11:37 AM  Resp 13 07/24/2018 11:37 AM  SpO2 93 % 07/24/2018 11:37 AM  Vitals shown include unvalidated device data.  Last Pain:  Vitals:   07/24/18 0801  TempSrc:   PainSc: 0-No pain         Complications: No apparent anesthesia complications

## 2018-07-25 ENCOUNTER — Encounter (HOSPITAL_COMMUNITY): Payer: Self-pay | Admitting: Surgery

## 2018-07-25 ENCOUNTER — Telehealth: Payer: Self-pay | Admitting: Surgery

## 2018-07-25 NOTE — Telephone Encounter (Signed)
sch appt vm full mdl ltr 08/25/18 1pm p/o PA

## 2018-07-25 NOTE — Telephone Encounter (Signed)
-----   Message from Mena Goes, RN sent at 07/24/2018 11:41 AM EST ----- Regarding: 4 weeks PA clinic    ----- Message ----- From: Iline Oven Sent: 07/24/2018  11:26 AM EST To: [facility] Admin Pool, [facility] Charge Pool   Can you schedule an appt for this pt in 4 weeks on PA clinic.  PO L arm 2nd stage basilic trans. Thanks, [facility]

## 2018-07-27 NOTE — Op Note (Signed)
    Patient name: Matthew ALESHIRE Sr. MRN: [id number] DOB: August 25, 1961 Sex: male  07/24/2018 Pre-operative Diagnosis: CKD Post-operative diagnosis:  Same Surgeon:  Annamarie Major Assistants:  Arlee Muslim Procedure:   2nd stage left basilic vein fistula Anesthesia:  General Blood Loss:  50 cc Specimens:  none  Findings:  Excellent vein  Indications: The patient comes in today for second stage basilic vein fistula creation.  Procedure:  The patient was identified in the holding area and taken to [place] [facility]  The patient was then placed supine on the table. general anesthesia was administered.  The patient was prepped and draped in the usual sterile fashion.  A time out was called and antibiotics were administered.  2 longitudinal incisions were made in the left upper arm after evaluating the fistula with ultrasound.  Through these incisions the basilic vein was isolated.  It was fully mobilized and side branches were ligated between silk ties.  The vein was of excellent caliber measuring at least 6 mm throughout.  I also exposed the brachial artery proximal to the previous anastomosis.  A subcutaneous tunnel was created with a Gore tunneler.  I ligated the fistula near the anastomosis.  The fistula was brought through the previously created tunnel making sure to maintain proper orientation.  I then occluded the brachial artery with vascular clamps and a 11 blade was used to make an arteriotomy which was extended longitudinally with Potts scissors.  The vein was cut the appropriate length and the anastomosis was completed with 6-0 Prolene.  Prior to completion the appropriate flushing maneuvers were performed and the anastomosis was completed.  There is an excellent thrill within the fistula.  There were brisk Doppler signals at the wrist and both the radial and ulnar artery.  Hemostasis was then achieved.  The incision was then closed with 2 layers of 3-0 Vicryl followed by Dermabond.  There were no  immediate complications.   Disposition: To PACU stable.   Theotis Burrow, M.D. [facility] [place] Office: [phone] Pager:  [phone]

## 2018-08-01 ENCOUNTER — Other Ambulatory Visit: Payer: Self-pay

## 2018-08-01 DIAGNOSIS — N185 Chronic kidney disease, stage 5: Secondary | ICD-10-CM

## 2018-08-05 ENCOUNTER — Other Ambulatory Visit: Payer: Self-pay | Admitting: Internal Medicine

## 2018-08-05 NOTE — Telephone Encounter (Signed)
Please review for refill. Thanks!  

## 2018-08-05 NOTE — Telephone Encounter (Signed)
Pt pharmacy is requesting refill on Rx Lasix 40 mg, but his medication list has Lasix 80 mg. Please address. Thank you.

## 2018-08-05 NOTE — Telephone Encounter (Signed)
Ok to refill.  Please confirm that his renal function is being monitored by his nephrology.  Thanks.  Nelva Bush, MD [facility] Pager: [phone]

## 2018-08-05 NOTE — Telephone Encounter (Signed)
Spoke to patient who confirmed that he is taking Lasix 80 mg bid.  It looks like it was recently increased in October by Vance Gather.  Is it ok to refill?

## 2018-08-22 NOTE — Progress Notes (Signed)
POST OPERATIVE OFFICE NOTE    CC:  F/u for surgery  HPI:  This is a 57 y.o. male who is s/p 2nd stage left basilic vein transposition on 07/24/18 by Dr. Trula Slade.  He presents today for follow up.  He denies any pain in his hand, but does state he has some numbness in his forearm and is still have an 8 out of 10 pain in his upper arm.  He is not yet on dialysis.    Allergies  Allergen Reactions  . Cleocin [Clindamycin Hcl] Anaphylaxis and Swelling  . Glimepiride [Amaryl] Other (See Comments)    Elevates liver function   . Clarithromycin Itching and Other (See Comments)    "Biaxin" Eyes itch and burn  . Colchicine Other (See Comments)    Affected kidneys   . Ciprocin-Fluocin-Procin [Fluocinolone Acetonide] Rash    Current Outpatient Medications  Medication Sig Dispense Refill  . acetaminophen (TYLENOL) 500 MG tablet Take 500 mg by mouth 2 (two) times daily as needed (for pain or headaches).     Marland Kitchen allopurinol (ZYLOPRIM) 100 MG tablet Take 100 mg by mouth 2 (two) times daily.     Marland Kitchen amLODipine (NORVASC) 10 MG tablet Take 1 tablet (10 mg total) by mouth daily. 90 tablet 3  . aspirin EC 81 MG EC tablet Take 1 tablet (81 mg total) by mouth daily.    Marland Kitchen atorvastatin (LIPITOR) 80 MG tablet Take 1 tablet (80 mg total) by mouth daily. (Patient taking differently: Take 80 mg by mouth at bedtime. ) 90 tablet 1  . carvedilol (COREG) 25 MG tablet Take 2 tablets (50 mg total) by mouth 2 (two) times daily. 360 tablet 1  . Cholecalciferol 2000 units TABS Take 2,000 Units by mouth daily.     . clopidogrel (PLAVIX) 75 MG tablet TAKE ONE TABLET BY MOUTH ONCE DAILY WITH BREAKFAST (Patient taking differently: Take 75 mg by mouth daily. ) 90 tablet 1  . diphenhydrAMINE (BENADRYL) 25 MG tablet Take 25 mg by mouth every 6 (six) hours as needed for allergies or sleep.     . furosemide (LASIX) 80 MG tablet Take 1 tablet (80 mg total) by mouth 2 (two) times daily. 180 tablet 3  . gabapentin (NEURONTIN) 300 MG  capsule Take 1 capsule (300 mg total) by mouth 2 (two) times daily. (Patient taking differently: Take 300 mg by mouth daily. ) 60 capsule 0  . hydrALAZINE (APRESOLINE) 50 MG tablet Take 1 tablet (50 mg total) by mouth 3 (three) times daily. 270 tablet 3  . HYDROcodone-acetaminophen (NORCO/VICODIN) 5-325 MG tablet Take 1 tablet by mouth every 6 (six) hours as needed for up to 12 doses for moderate pain. 12 tablet 0  . insulin aspart protamine- aspart (NOVOLOG MIX 70/30) (70-30) 100 UNIT/ML injection Inject 20 Units into the skin 2 (two) times daily as needed (if blood sugar is over 180).    . isosorbide mononitrate (IMDUR) 120 MG 24 hr tablet Take 1 tablet (120 mg total) by mouth 2 (two) times daily.    . methylPREDNISolone (MEDROL) 4 MG tablet Take 4-20 mg by mouth See admin instructions. Take 20 mg the first day of a gout flare then decrease by 4 mg until finished    . Misc. Devices MISC by Does not apply route. C-PAP    . Multiple Vitamin (MULTIVITAMIN WITH MINERALS) TABS tablet Take 1 tablet by mouth daily.    . nitroGLYCERIN (NITROLINGUAL) 0.4 MG/SPRAY spray Place 1 spray under the tongue every 5 (  five) minutes x 3 doses as needed for chest pain.    . nitroGLYCERIN (NITROSTAT) 0.4 MG SL tablet Place 1 tablet (0.4 mg total) under the tongue every 5 (five) minutes as needed for chest pain. 25 tablet 5  . pantoprazole (PROTONIX) 40 MG tablet Take 40 mg by mouth daily.     . ranitidine (ZANTAC) 300 MG tablet Take 300 mg by mouth at bedtime.    . ranolazine (RANEXA) 500 MG 12 hr tablet Take 1 tablet (500 mg total) by mouth 2 (two) times daily. 180 tablet 3  . Semaglutide (OZEMPIC, 0.25 OR 0.5 MG/DOSE, [place]) Inject 0.25 mg into the skin every Saturday.    . traMADol (ULTRAM) 50 MG tablet Take 1 tablet (50 mg total) by mouth every 12 (twelve) hours as needed for moderate pain. 12 tablet 0  . vitamin E 400 UNIT capsule Take 400 Units by mouth daily.    Marland Kitchen zolpidem (AMBIEN) 10 MG tablet Take 10 mg by mouth  at bedtime as needed for sleep.      No current facility-administered medications for this visit.      ROS:  See HPI  Physical Exam:  Today's Vitals   08/25/18 1548 08/25/18 1551  BP: 123/76   Pulse: 89   Resp: 20   Temp: (!) 97.3 F (36.3 C)   SpO2: 97%   Weight: 222 lb 3.2 oz (100.8 kg)   Height: 5\' 5"  (1.651 m)   PainSc:  2    Body mass index is 36.98 kg/m.  Incision:  Both are healing nicely Extremities:  There is an excellent thrill/bruit within the fistula; left radial pulse is palpable.  Strong left hand grip.  Dialysis duplex 08/25/18:   +------------+----------+-------------+----------+------------------+  OUTFLOW VEINPSV (cm/s)Diameter (cm)Depth (cm)   Describe     +------------+----------+-------------+----------+------------------+  Prox UA     200    0.80     0.78             +------------+----------+-------------+----------+------------------+  Mid UA     226    0.78     0.20             +------------+----------+-------------+----------+------------------+  Dist UA     761    0.48     0.42  change in Diameter  +------------+----------+-------------+----------+------------------+  AC Fossa    332    0.79     0.91            Assessment/Plan:  This is a 57 y.o. male who is s/p: 2nd stage left basilic vein transposition on 07/24/18 by Dr. Trula Slade.  -pt fistula is easy to palpate and there is an excellent thrill and bruit throughout.  No evidence of steal sx. -he is still having considerable pain in his upper arm most likely related to tunneling the fistula.  Routed Percocet 5/325 one q6h prn pain #8 No refill and told him that he shouldn't require anymore and could take Tylenol after that.  -he does have some numbness in his forearm and discussed with him that this is related to nerve irritation and should improve over time. -Discussed with him that this will  most likely require maintenance in the future or may even need new access.  He will f/u with [place] as needed.   -his fistula is ready to use should he need dialysis.    Leontine Locket, PA-C [facility] [phone]  Clinic MD:  none

## 2018-08-25 ENCOUNTER — Ambulatory Visit (HOSPITAL_COMMUNITY)
Admission: RE | Admit: 2018-08-25 | Discharge: 2018-08-25 | Disposition: A | Discharge status: Home / Self Care | Payer: Medicare HMO | Source: Ambulatory Visit | Attending: Vascular Surgery | Admitting: Vascular Surgery

## 2018-08-25 ENCOUNTER — Other Ambulatory Visit: Payer: Self-pay

## 2018-08-25 ENCOUNTER — Ambulatory Visit (INDEPENDENT_AMBULATORY_CARE_PROVIDER_SITE_OTHER): Payer: Self-pay | Admitting: Physician Assistant

## 2018-08-25 VITALS — BP 123/76 | HR 89 | Temp 97.3°F | Resp 20 | Ht 65.0 in | Wt 222.2 lb

## 2018-08-25 DIAGNOSIS — N185 Chronic kidney disease, stage 5: Secondary | ICD-10-CM

## 2018-08-25 MED ORDER — OXYCODONE-ACETAMINOPHEN 5-325 MG PO TABS: 1.0000 | ORAL_TABLET | Freq: Four times a day (QID) | ORAL | 0 refills | Status: DC | PRN

## 2018-08-28 ENCOUNTER — Encounter: Payer: Self-pay | Admitting: Interventional Cardiology

## 2018-08-28 ENCOUNTER — Ambulatory Visit (INDEPENDENT_AMBULATORY_CARE_PROVIDER_SITE_OTHER): Payer: Medicare HMO | Admitting: Interventional Cardiology

## 2018-08-28 VITALS — BP 124/68 | HR 87 | Ht 65.0 in | Wt 224.2 lb

## 2018-08-28 DIAGNOSIS — E1122 Type 2 diabetes mellitus with diabetic chronic kidney disease: Secondary | ICD-10-CM | POA: Diagnosis not present

## 2018-08-28 DIAGNOSIS — I255 Ischemic cardiomyopathy: Secondary | ICD-10-CM | POA: Diagnosis not present

## 2018-08-28 DIAGNOSIS — E785 Hyperlipidemia, unspecified: Secondary | ICD-10-CM | POA: Diagnosis not present

## 2018-08-28 DIAGNOSIS — I25118 Atherosclerotic heart disease of native coronary artery with other forms of angina pectoris: Secondary | ICD-10-CM

## 2018-08-28 DIAGNOSIS — Z992 Dependence on renal dialysis: Secondary | ICD-10-CM | POA: Diagnosis not present

## 2018-08-28 DIAGNOSIS — N186 End stage renal disease: Secondary | ICD-10-CM | POA: Diagnosis not present

## 2018-08-28 DIAGNOSIS — Z794 Long term (current) use of insulin: Secondary | ICD-10-CM | POA: Diagnosis not present

## 2018-08-28 DIAGNOSIS — N184 Chronic kidney disease, stage 4 (severe): Secondary | ICD-10-CM

## 2018-08-28 NOTE — Patient Instructions (Signed)

## 2018-08-28 NOTE — Progress Notes (Signed)
Cardiology Office Note   Date:  08/28/2018   ID:  Matthew Kirks Sr., DOB [birth year], MRN [id number]  PCP:  [name], MD    No chief complaint on file.  CAD  Wt Readings from Last 3 Encounters:  08/28/18 224 lb 3.2 oz (101.7 kg)  08/25/18 222 lb 3.2 oz (100.8 kg)  07/24/18 227 lb (103 kg)       History of Present Illness: Matthew Hilbert. is a 57 y.o. 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   In October 2019, he had an episode of chest discomfort when he went to the [facility].  It was 10 out of 10 while he was having a stress test done.  Left heart catheterization was done showing complex LAD disease.  Given that his anterior wall was thought to be partially nonviable due to prior infarction, and treating the LAD stenosis would JL a large ramus territory which was viable, medical therapy was opted for.  He has done well with this.  He has had a dialysis fistula placed.  No specific date for dialysis has been set.    Since the hosptial stay, he has done well, except for some left arm pain related to his fistula surgery.  Denies : Chest pain. Dizziness. Leg edema. Nitroglycerin use. Orthopnea. Palpitations. Paroxysmal nocturnal dyspnea. Shortness of breath. Syncope.   Seeing me for the first time today since Dr. Saunders Revel moved practice to [place] [employer].   Past Medical History:  Diagnosis Date  . Acute on chronic combined systolic and diastolic CHF (congestive heart failure) ([place]) 10/15/2017  . Chronic anemia   . Chronic combined systolic and diastolic CHF (congestive heart failure) ([place])   . Chronic stable angina ([place])   . CKD (chronic kidney disease), stage IV ([place])   . Coronary  artery disease    a. INF MI 1999: PCI of RCA;  b.  extensive stenting of LAD;  c. s/p CABG with RIMA->RCA in 2006;  d. Stable, Low risk MV 05/2012; e. 09/2012 Cath: stable anatomy->med Rx. f. Abnl nuc 11/2013 -> med rx; g. 11/2013 Cath/PCI: s/p DES to RCA. h. Abnormal nuc 11/2016, pt declined cath.   . DM2 (diabetes mellitus, type 2) ([facility])    Type II  . Dyslipidemia   . GERD (gastroesophageal reflux disease)   . History of gout   . Hypertension   . Hypertensive heart disease   . Ischemic cardiomyopathy   . Mild aortic stenosis   . Myocardial infarction [facility]) 1999; 2002; 2003; 2006; ~ 2008  . OSA on CPAP    wears CPAP  . Polycystic kidney disease     Past Surgical History:  Procedure Laterality Date  . AV FISTULA PLACEMENT Left 05/29/2018   Procedure: ARTERIOVENOUS (AV) FISTULA CREATION  LEFT ARM.;  Surgeon: Serafina Mitchell, MD;  Location: [place];  Service: Vascular;  Laterality: Left;  . BASCILIC VEIN TRANSPOSITION Left 07/24/2018   Procedure: SECOND STAGE BASCILIC VEIN TRANSPOSITION LEFT ARM;  Surgeon: Serafina Mitchell, MD;  Location: [place];  Service: Vascular;  Laterality: Left;  . CARDIAC CATHETERIZATION  2012  . CARDIAC CATHETERIZATION  2013  . CARDIAC CATHETERIZATION  2014   LHC (1/14): total occlusion of distal LAD,  diffuse disease in ramus, 70% proximal LCx, 50% proximal and mid RCA, 50% distal RCA, patent RIMA-RCA.  Marland Kitchen CORONARY ANGIOPLASTY WITH STENT PLACEMENT  1999 / 2002 / 2004 / 2006?  . CORONARY ARTERY BYPASS GRAFT  2006   CABG X?; in [place]  . CYSTECTOMY  1990's   off face  . HERNIA REPAIR     inguinal hernia  . LEFT HEART CATHETERIZATION WITH CORONARY/GRAFT ANGIOGRAM N/A 09/16/2012   Procedure: LEFT HEART CATHETERIZATION WITH [name];  Surgeon: [name], MD;  Location: [facility] CATH LAB;  Service: Cardiovascular;  Laterality: N/A;  . LEFT HEART CATHETERIZATION WITH CORONARY/GRAFT ANGIOGRAM N/A 12/02/2013   Procedure: LEFT HEART CATHETERIZATION WITH  [name];  Surgeon: [name], MD;  Location: [place] CATH LAB;  Service: Cardiovascular;  Laterality: N/A;  . PERCUTANEOUS CORONARY STENT INTERVENTION (PCI-S) N/A 12/03/2013   Procedure: PERCUTANEOUS CORONARY STENT INTERVENTION (PCI-S);  Surgeon: [name], MD;  Location[facility] CATH LAB;  Service: Cardiovascular;  Laterality: N/A;  . RIGHT/LEFT HEART CATH AND CORONARY/GRAFT ANGIOGRAPHY N/A 06/23/2018   Procedure: RIGHT/LEFT HEART CATH AND CORONARY/GRAFT ANGIOGRAPHY;  Surgeon: [name], MD;  Location: [place] CV LAB;  Service: Cardiovascular;  Laterality: N/A;     Current Outpatient Medications  Medication Sig Dispense Refill  . acetaminophen (TYLENOL) 500 MG tablet Take 500 mg by mouth 2 (two) times daily as needed (for pain or headaches).     Marland Kitchen allopurinol (ZYLOPRIM) 100 MG tablet Take 100 mg by mouth 2 (two) times daily.     Marland Kitchen amLODipine (NORVASC) 10 MG tablet Take 1 tablet (10 mg total) by mouth daily. 90 tablet 3  . aspirin EC 81 MG EC tablet Take 1 tablet (81 mg total) by mouth daily.    Marland Kitchen atorvastatin (LIPITOR) 80 MG tablet Take 1 tablet (80 mg total) by mouth daily. (Patient taking differently: Take 80 mg by mouth at bedtime. ) 90 tablet 1  . carvedilol (COREG) 25 MG tablet Take 2 tablets (50 mg total) by mouth 2 (two) times daily. 360 tablet 1  . Cholecalciferol 2000 units TABS Take 2,000 Units by mouth daily.     . clopidogrel (PLAVIX) 75 MG tablet TAKE ONE TABLET BY MOUTH ONCE DAILY WITH BREAKFAST (Patient taking differently: Take 75 mg by mouth daily. ) 90 tablet 1  . diphenhydrAMINE (BENADRYL) 25 MG tablet Take 25 mg by mouth every 6 (six) hours as needed for allergies or sleep.     . furosemide (LASIX) 80 MG tablet Take 1 tablet (80 mg total) by mouth 2 (two) times daily. 180 tablet 3  . gabapentin (NEURONTIN) 300 MG capsule Take 1 capsule (300 mg total) by mouth 2 (two) times daily. (Patient taking differently: Take 300 mg by mouth daily. )  60 capsule 0  . hydrALAZINE (APRESOLINE) 50 MG tablet Take 1 tablet (50 mg total) by mouth 3 (three) times daily. 270 tablet 3  . HYDROcodone-acetaminophen (NORCO/VICODIN) 5-325 MG tablet Take 1 tablet by mouth every 6 (six) hours as needed for up to 12 doses for moderate pain. 12 tablet 0  . insulin aspart protamine- aspart (NOVOLOG MIX 70/30) (70-30) 100 UNIT/ML injection Inject 20 Units into the skin 2 (two) times daily as needed (if blood sugar is over 180).    . isosorbide mononitrate (IMDUR) 120 MG 24 hr tablet Take 1 tablet (120 mg total) by mouth 2 (two) times daily.    . Misc. Devices MISC by Does not apply route. C-PAP    .  Multiple Vitamin (MULTIVITAMIN WITH MINERALS) TABS tablet Take 1 tablet by mouth daily.    . nitroGLYCERIN (NITROLINGUAL) 0.4 MG/SPRAY spray Place 1 spray under the tongue every 5 (five) minutes x 3 doses as needed for chest pain.    . nitroGLYCERIN (NITROSTAT) 0.4 MG SL tablet Place 1 tablet (0.4 mg total) under the tongue every 5 (five) minutes as needed for chest pain. 25 tablet 5  . oxyCODONE-acetaminophen (PERCOCET) 5-325 MG tablet Take 1 tablet by mouth every 6 (six) hours as needed for severe pain. 8 tablet 0  . pantoprazole (PROTONIX) 40 MG tablet Take 40 mg by mouth daily.     . ranitidine (ZANTAC) 300 MG tablet Take 300 mg by mouth at bedtime.    . ranolazine (RANEXA) 500 MG 12 hr tablet Take 1 tablet (500 mg total) by mouth 2 (two) times daily. 180 tablet 3  . Semaglutide (OZEMPIC, 0.25 OR 0.5 MG/DOSE, [place]) Inject 0.25 mg into the skin every Saturday.    . traMADol (ULTRAM) 50 MG tablet Take 1 tablet (50 mg total) by mouth every 12 (twelve) hours as needed for moderate pain. 12 tablet 0  . vitamin E 400 UNIT capsule Take 400 Units by mouth daily.    Marland Kitchen zolpidem (AMBIEN) 10 MG tablet Take 10 mg by mouth at bedtime as needed for sleep.      No current facility-administered medications for this visit.     Allergies:   Cleocin [clindamycin hcl]; Glimepiride  [amaryl]; Clarithromycin; Colchicine; and Ciprocin-fluocin-procin [fluocinolone acetonide]    Social History:  The patient  reports that he has quit smoking. His smoking use included cigarettes. He has never used smokeless tobacco. He reports that he does not drink alcohol or use drugs.   Family History:  The patient's family history includes Anemia in his mother; Diabetes in his brother, father, sister, and sister; Heart attack in his sister; Heart attack (age of onset: 5) in his father; Heart failure in his father; Hyperlipidemia in his father; Hypertension in his father and sister; Kidney failure (age of onset: 66) in his sister; Lung cancer in his mother; Polycystic kidney disease in his brother and mother.    ROS:  Please see the history of present illness.   Otherwise, review of systems are positive for left arm pain post surgery- improving.   All other systems are reviewed and negative.    PHYSICAL EXAM: VS:  BP 124/68   Pulse 87   Ht 5\' 5"  (1.651 m)   Wt 224 lb 3.2 oz (101.7 kg)   SpO2 99%   BMI 37.31 kg/m  , BMI Body mass index is 37.31 kg/m. GEN: Well nourished, well developed, in no acute distress  HEENT: normal  Neck: no JVD, carotid bruits, or masses Cardiac: RRR; no murmurs, rubs, or gallops,no edema ; palpable thrill on left arm Respiratory:  clear to auscultation bilaterally, normal work of breathing GI: soft, nontender, nondistended, + BS, obesityMS: no deformity or atrophy  Skin: warm and dry, no rash Neuro:  Strength and sensation are intact Psych: euthymic mood, full affect    Recent Labs: 10/15/2017: TSH 2.536 06/17/2018: B Natriuretic Peptide 181.8 06/25/2018: ALT 20 06/26/2018: BUN 58; Creatinine, Ser 4.45; Platelets 198 07/24/2018: Hemoglobin 9.5; Potassium 3.7; Sodium 142   Lipid Panel    Component Value Date/Time   CHOL 128 06/25/2018 0326   CHOL 161 11/12/2016 1341   TRIG 101 06/25/2018 0326   HDL 35 (L) 06/25/2018 0326   HDL 31 (L) 11/12/2016  1341   CHOLHDL 3.7 06/25/2018 0326   VLDL 20 06/25/2018 0326   LDLCALC 73 06/25/2018 0326   LDLCALC 86 11/12/2016 1341   LDLDIRECT 72.0 11/03/2014 0923     Other studies Reviewed: Additional studies/ records that were reviewed today with results demonstrating: Cath records reviewed.   ASSESSMENT AND PLAN:  1. CAD: Angina well-controlled on medical therapy.  Continue aggressive secondary prevention.  Ranexa has worked well.  2. ESRD: He tolerated dialysis fistula surgery. 3. Hyperlipidemia: Continue lipid-lowering therapy with high-dose atorvastatin. 4. DM: Managed by PMD.    Current medicines are reviewed at length with the patient today.  The patient concerns regarding his medicines were addressed.  The following changes have been made:  No change  Labs/ tests ordered today include:  No orders of the defined types were placed in this encounter.   Recommend 150 minutes/week of aerobic exercise Low fat, low carb, high fiber diet recommended  Disposition:   FU in 6 months   Signed, Larae Grooms, MD  08/28/2018 4:17 PM    [facility] [address], [place], [place]  [address] Phone: [phone]; Fax: [phone]

## 2018-09-26 DIAGNOSIS — H6123 Impacted cerumen, bilateral: Secondary | ICD-10-CM | POA: Diagnosis not present

## 2018-10-28 DIAGNOSIS — K219 Gastro-esophageal reflux disease without esophagitis: Secondary | ICD-10-CM | POA: Diagnosis not present

## 2018-10-28 DIAGNOSIS — N184 Chronic kidney disease, stage 4 (severe): Secondary | ICD-10-CM | POA: Diagnosis not present

## 2018-10-28 DIAGNOSIS — N529 Male erectile dysfunction, unspecified: Secondary | ICD-10-CM | POA: Diagnosis not present

## 2018-10-28 DIAGNOSIS — M1A9XX0 Chronic gout, unspecified, without tophus (tophi): Secondary | ICD-10-CM | POA: Diagnosis not present

## 2018-10-28 DIAGNOSIS — I5033 Acute on chronic diastolic (congestive) heart failure: Secondary | ICD-10-CM | POA: Diagnosis not present

## 2018-10-28 DIAGNOSIS — N185 Chronic kidney disease, stage 5: Secondary | ICD-10-CM | POA: Diagnosis not present

## 2018-10-28 DIAGNOSIS — Z794 Long term (current) use of insulin: Secondary | ICD-10-CM | POA: Diagnosis not present

## 2018-10-28 DIAGNOSIS — E1122 Type 2 diabetes mellitus with diabetic chronic kidney disease: Secondary | ICD-10-CM | POA: Diagnosis not present

## 2018-10-28 DIAGNOSIS — I10 Essential (primary) hypertension: Secondary | ICD-10-CM | POA: Diagnosis not present

## 2018-10-28 DIAGNOSIS — E1159 Type 2 diabetes mellitus with other circulatory complications: Secondary | ICD-10-CM | POA: Diagnosis not present

## 2019-03-31 ENCOUNTER — Other Ambulatory Visit: Payer: Self-pay | Admitting: Internal Medicine

## 2019-03-31 NOTE — Telephone Encounter (Signed)
Please review for refills.

## 2019-04-01 MED ORDER — CARVEDILOL 25 MG PO TABS: 50.0000 mg | ORAL_TABLET | Freq: Two times a day (BID) | ORAL | 0 refills | Status: DC

## 2019-04-01 NOTE — Telephone Encounter (Signed)
Refill Request.  

## 2019-07-07 ENCOUNTER — Telehealth: Payer: Self-pay | Admitting: Interventional Cardiology

## 2019-07-07 NOTE — Telephone Encounter (Signed)
New message:     Patient wife calling and would like for some one to call concering getting a Nitro patch. Please call patient wife back if that will be fine.

## 2019-07-07 NOTE — Telephone Encounter (Signed)
Left message for patient to call back  

## 2019-07-10 ENCOUNTER — Emergency Department (HOSPITAL_COMMUNITY): Payer: Medicare HMO

## 2019-07-10 ENCOUNTER — Other Ambulatory Visit: Payer: Self-pay

## 2019-07-10 ENCOUNTER — Inpatient Hospital Stay (HOSPITAL_COMMUNITY)
Admission: EM | Admit: 2019-07-10 | Discharge: 2019-07-16 | DRG: 280 | Disposition: A | Discharge status: Home Health | Payer: Medicare HMO | Attending: Internal Medicine | Admitting: Internal Medicine

## 2019-07-10 DIAGNOSIS — K219 Gastro-esophageal reflux disease without esophagitis: Secondary | ICD-10-CM | POA: Diagnosis present

## 2019-07-10 DIAGNOSIS — I214 Non-ST elevation (NSTEMI) myocardial infarction: Principal | ICD-10-CM | POA: Diagnosis present

## 2019-07-10 DIAGNOSIS — Z8349 Family history of other endocrine, nutritional and metabolic diseases: Secondary | ICD-10-CM

## 2019-07-10 DIAGNOSIS — Z7902 Long term (current) use of antithrombotics/antiplatelets: Secondary | ICD-10-CM

## 2019-07-10 DIAGNOSIS — I361 Nonrheumatic tricuspid (valve) insufficiency: Secondary | ICD-10-CM | POA: Diagnosis not present

## 2019-07-10 DIAGNOSIS — G4733 Obstructive sleep apnea (adult) (pediatric): Secondary | ICD-10-CM | POA: Diagnosis present

## 2019-07-10 DIAGNOSIS — I255 Ischemic cardiomyopathy: Secondary | ICD-10-CM | POA: Diagnosis present

## 2019-07-10 DIAGNOSIS — Z6841 Body Mass Index (BMI) 40.0 and over, adult: Secondary | ICD-10-CM | POA: Diagnosis not present

## 2019-07-10 DIAGNOSIS — N2581 Secondary hyperparathyroidism of renal origin: Secondary | ICD-10-CM | POA: Diagnosis present

## 2019-07-10 DIAGNOSIS — I2510 Atherosclerotic heart disease of native coronary artery without angina pectoris: Secondary | ICD-10-CM

## 2019-07-10 DIAGNOSIS — I200 Unstable angina: Secondary | ICD-10-CM | POA: Diagnosis present

## 2019-07-10 DIAGNOSIS — Q245 Malformation of coronary vessels: Secondary | ICD-10-CM | POA: Diagnosis not present

## 2019-07-10 DIAGNOSIS — I252 Old myocardial infarction: Secondary | ICD-10-CM

## 2019-07-10 DIAGNOSIS — Q613 Polycystic kidney, unspecified: Secondary | ICD-10-CM | POA: Diagnosis not present

## 2019-07-10 DIAGNOSIS — Z87891 Personal history of nicotine dependence: Secondary | ICD-10-CM

## 2019-07-10 DIAGNOSIS — D631 Anemia in chronic kidney disease: Secondary | ICD-10-CM | POA: Diagnosis present

## 2019-07-10 DIAGNOSIS — E875 Hyperkalemia: Secondary | ICD-10-CM | POA: Diagnosis not present

## 2019-07-10 DIAGNOSIS — Z8271 Family history of polycystic kidney: Secondary | ICD-10-CM

## 2019-07-10 DIAGNOSIS — Z833 Family history of diabetes mellitus: Secondary | ICD-10-CM

## 2019-07-10 DIAGNOSIS — N186 End stage renal disease: Secondary | ICD-10-CM | POA: Diagnosis present

## 2019-07-10 DIAGNOSIS — Z801 Family history of malignant neoplasm of trachea, bronchus and lung: Secondary | ICD-10-CM

## 2019-07-10 DIAGNOSIS — E785 Hyperlipidemia, unspecified: Secondary | ICD-10-CM | POA: Diagnosis present

## 2019-07-10 DIAGNOSIS — I2584 Coronary atherosclerosis due to calcified coronary lesion: Secondary | ICD-10-CM | POA: Diagnosis present

## 2019-07-10 DIAGNOSIS — Z881 Allergy status to other antibiotic agents status: Secondary | ICD-10-CM | POA: Diagnosis not present

## 2019-07-10 DIAGNOSIS — E1122 Type 2 diabetes mellitus with diabetic chronic kidney disease: Secondary | ICD-10-CM | POA: Diagnosis present

## 2019-07-10 DIAGNOSIS — I493 Ventricular premature depolarization: Secondary | ICD-10-CM | POA: Diagnosis present

## 2019-07-10 DIAGNOSIS — M109 Gout, unspecified: Secondary | ICD-10-CM | POA: Diagnosis present

## 2019-07-10 DIAGNOSIS — I5043 Acute on chronic combined systolic (congestive) and diastolic (congestive) heart failure: Secondary | ICD-10-CM | POA: Diagnosis present

## 2019-07-10 DIAGNOSIS — Z9989 Dependence on other enabling machines and devices: Secondary | ICD-10-CM | POA: Diagnosis not present

## 2019-07-10 DIAGNOSIS — N184 Chronic kidney disease, stage 4 (severe): Secondary | ICD-10-CM | POA: Diagnosis present

## 2019-07-10 DIAGNOSIS — Z7982 Long term (current) use of aspirin: Secondary | ICD-10-CM

## 2019-07-10 DIAGNOSIS — I083 Combined rheumatic disorders of mitral, aortic and tricuspid valves: Secondary | ICD-10-CM | POA: Diagnosis present

## 2019-07-10 DIAGNOSIS — Z20828 Contact with and (suspected) exposure to other viral communicable diseases: Secondary | ICD-10-CM | POA: Diagnosis present

## 2019-07-10 DIAGNOSIS — I132 Hypertensive heart and chronic kidney disease with heart failure and with stage 5 chronic kidney disease, or end stage renal disease: Secondary | ICD-10-CM | POA: Diagnosis present

## 2019-07-10 DIAGNOSIS — R7989 Other specified abnormal findings of blood chemistry: Secondary | ICD-10-CM

## 2019-07-10 DIAGNOSIS — Z794 Long term (current) use of insulin: Secondary | ICD-10-CM

## 2019-07-10 DIAGNOSIS — I340 Nonrheumatic mitral (valve) insufficiency: Secondary | ICD-10-CM | POA: Diagnosis not present

## 2019-07-10 DIAGNOSIS — N185 Chronic kidney disease, stage 5: Secondary | ICD-10-CM | POA: Diagnosis not present

## 2019-07-10 DIAGNOSIS — K5900 Constipation, unspecified: Secondary | ICD-10-CM | POA: Diagnosis not present

## 2019-07-10 DIAGNOSIS — Z888 Allergy status to other drugs, medicaments and biological substances status: Secondary | ICD-10-CM | POA: Diagnosis not present

## 2019-07-10 DIAGNOSIS — R778 Other specified abnormalities of plasma proteins: Secondary | ICD-10-CM

## 2019-07-10 DIAGNOSIS — E1121 Type 2 diabetes mellitus with diabetic nephropathy: Secondary | ICD-10-CM | POA: Diagnosis not present

## 2019-07-10 DIAGNOSIS — Z79899 Other long term (current) drug therapy: Secondary | ICD-10-CM

## 2019-07-10 DIAGNOSIS — I25110 Atherosclerotic heart disease of native coronary artery with unstable angina pectoris: Secondary | ICD-10-CM | POA: Diagnosis present

## 2019-07-10 DIAGNOSIS — Z9861 Coronary angioplasty status: Secondary | ICD-10-CM | POA: Diagnosis not present

## 2019-07-10 DIAGNOSIS — R140 Abdominal distension (gaseous): Secondary | ICD-10-CM

## 2019-07-10 DIAGNOSIS — Z955 Presence of coronary angioplasty implant and graft: Secondary | ICD-10-CM | POA: Diagnosis not present

## 2019-07-10 DIAGNOSIS — Z951 Presence of aortocoronary bypass graft: Secondary | ICD-10-CM

## 2019-07-10 DIAGNOSIS — R32 Unspecified urinary incontinence: Secondary | ICD-10-CM | POA: Diagnosis present

## 2019-07-10 DIAGNOSIS — Z841 Family history of disorders of kidney and ureter: Secondary | ICD-10-CM

## 2019-07-10 DIAGNOSIS — Z79891 Long term (current) use of opiate analgesic: Secondary | ICD-10-CM

## 2019-07-10 DIAGNOSIS — I447 Left bundle-branch block, unspecified: Secondary | ICD-10-CM | POA: Diagnosis present

## 2019-07-10 DIAGNOSIS — Z8249 Family history of ischemic heart disease and other diseases of the circulatory system: Secondary | ICD-10-CM

## 2019-07-10 LAB — SARS CORONAVIRUS 2 BY RT PCR (HOSPITAL ORDER, PERFORMED IN ~~LOC~~ HOSPITAL LAB): SARS Coronavirus 2: NEGATIVE

## 2019-07-10 LAB — CBC
HCT: 25.5 % — ABNORMAL LOW (ref 39.0–52.0)
Hemoglobin: 8.1 g/dL — ABNORMAL LOW (ref 13.0–17.0)
MCH: 28.1 pg (ref 26.0–34.0)
MCHC: 31.8 g/dL (ref 30.0–36.0)
MCV: 88.5 fL (ref 80.0–100.0)
Platelets: 189 10*3/uL (ref 150–400)
RBC: 2.88 MIL/uL — ABNORMAL LOW (ref 4.22–5.81)
RDW: 15.5 % (ref 11.5–15.5)
WBC: 7.6 10*3/uL (ref 4.0–10.5)
nRBC: 0 % (ref 0.0–0.2)

## 2019-07-10 LAB — BASIC METABOLIC PANEL
Anion gap: 15 (ref 5–15)
BUN: 67 mg/dL — ABNORMAL HIGH (ref 6–20)
CO2: 19 mmol/L — ABNORMAL LOW (ref 22–32)
Calcium: 8.9 mg/dL (ref 8.9–10.3)
Chloride: 100 mmol/L (ref 98–111)
Creatinine, Ser: 6.26 mg/dL — ABNORMAL HIGH (ref 0.61–1.24)
GFR calc Af Amer: 10 mL/min — ABNORMAL LOW (ref >60)
GFR calc non Af Amer: 9 mL/min — ABNORMAL LOW (ref >60)
Glucose, Bld: 215 mg/dL — ABNORMAL HIGH (ref 70–99)
Potassium: 3.7 mmol/L (ref 3.5–5.1)
Sodium: 134 mmol/L — ABNORMAL LOW (ref 135–145)

## 2019-07-10 LAB — HEMOGLOBIN A1C
Hgb A1c MFr Bld: 6.1 % — ABNORMAL HIGH (ref 4.8–5.6)
Mean Plasma Glucose: 128.37 mg/dL

## 2019-07-10 LAB — CBG MONITORING, ED: Glucose-Capillary: 85 mg/dL (ref 70–99)

## 2019-07-10 LAB — TROPONIN I (HIGH SENSITIVITY)
Troponin I (High Sensitivity): 26 ng/L — ABNORMAL HIGH (ref <18)
Troponin I (High Sensitivity): 130 ng/L (ref <18)

## 2019-07-10 LAB — BRAIN NATRIURETIC PEPTIDE: B Natriuretic Peptide: 1468.8 pg/mL — ABNORMAL HIGH (ref 0.0–100.0)

## 2019-07-10 LAB — HIV ANTIBODY (ROUTINE TESTING W REFLEX): HIV Screen 4th Generation wRfx: NONREACTIVE

## 2019-07-10 MED ORDER — ASPIRIN 81 MG PO CHEW
81.0000 mg | CHEWABLE_TABLET | Freq: Every day | ORAL | Status: DC
  Administered 2019-07-11 – 2019-07-16 (×5): 81 mg via ORAL
  Filled 2019-07-10 (×5): qty 1

## 2019-07-10 MED ORDER — HYDRALAZINE HCL 50 MG PO TABS
50.0000 mg | ORAL_TABLET | Freq: Three times a day (TID) | ORAL | Status: DC
  Filled 2019-07-10: qty 1
  Filled 2019-07-10: qty 2

## 2019-07-10 MED ORDER — TRAMADOL HCL 50 MG PO TABS
50.0000 mg | ORAL_TABLET | Freq: Two times a day (BID) | ORAL | Status: DC | PRN
  Administered 2019-07-11 – 2019-07-15 (×5): 50 mg via ORAL
  Filled 2019-07-10 (×4): qty 1

## 2019-07-10 MED ORDER — SODIUM CHLORIDE 0.9 % IV SOLN: 250.0000 mL | INTRAVENOUS | Status: DC | PRN

## 2019-07-10 MED ORDER — HEPARIN (PORCINE) 25000 UT/250ML-% IV SOLN
1200.0000 [IU]/h | INTRAVENOUS | Status: DC
  Administered 2019-07-10 – 2019-07-12 (×3): 1100 [IU]/h via INTRAVENOUS
  Administered 2019-07-13: 1200 [IU]/h via INTRAVENOUS
  Filled 2019-07-10 (×4): qty 250

## 2019-07-10 MED ORDER — SODIUM CHLORIDE 0.9% FLUSH: 3.0000 mL | Freq: Once | INTRAVENOUS | Status: DC

## 2019-07-10 MED ORDER — ZOLPIDEM TARTRATE 5 MG PO TABS
10.0000 mg | ORAL_TABLET | Freq: Every evening | ORAL | Status: DC | PRN
  Administered 2019-07-11 (×2): 10 mg via ORAL
  Filled 2019-07-10 (×2): qty 2

## 2019-07-10 MED ORDER — NITROGLYCERIN 0.4 MG SL SUBL
0.4000 mg | SUBLINGUAL_TABLET | SUBLINGUAL | Status: DC | PRN
  Administered 2019-07-13 (×3): 0.4 mg via SUBLINGUAL
  Filled 2019-07-10: qty 1

## 2019-07-10 MED ORDER — FUROSEMIDE 10 MG/ML IJ SOLN
40.0000 mg | INTRAMUSCULAR | Status: AC
  Administered 2019-07-10: 18:00:00 40 mg via INTRAVENOUS
  Filled 2019-07-10: qty 4

## 2019-07-10 MED ORDER — MORPHINE SULFATE (PF) 4 MG/ML IV SOLN
6.0000 mg | Freq: Once | INTRAVENOUS | Status: AC
  Administered 2019-07-10: 6 mg via INTRAVENOUS
  Filled 2019-07-10: qty 2

## 2019-07-10 MED ORDER — DARBEPOETIN ALFA 200 MCG/0.4ML IJ SOSY
200.0000 ug | PREFILLED_SYRINGE | INTRAMUSCULAR | Status: DC
  Administered 2019-07-11: 18:00:00 200 ug via SUBCUTANEOUS
  Filled 2019-07-10: qty 0.4

## 2019-07-10 MED ORDER — ISOSORBIDE MONONITRATE ER 60 MG PO TB24
120.0000 mg | ORAL_TABLET | Freq: Two times a day (BID) | ORAL | Status: DC
  Administered 2019-07-11 – 2019-07-12 (×3): 120 mg via ORAL
  Filled 2019-07-10 (×5): qty 2

## 2019-07-10 MED ORDER — PANTOPRAZOLE SODIUM 40 MG PO TBEC
40.0000 mg | DELAYED_RELEASE_TABLET | Freq: Every day | ORAL | Status: DC
  Administered 2019-07-11 – 2019-07-16 (×5): 40 mg via ORAL
  Filled 2019-07-10 (×5): qty 1

## 2019-07-10 MED ORDER — AMLODIPINE BESYLATE 10 MG PO TABS
10.0000 mg | ORAL_TABLET | Freq: Every day | ORAL | Status: DC
  Filled 2019-07-10: qty 1
  Filled 2019-07-10: qty 2

## 2019-07-10 MED ORDER — ATORVASTATIN CALCIUM 80 MG PO TABS
80.0000 mg | ORAL_TABLET | Freq: Every day | ORAL | Status: DC
  Administered 2019-07-10 – 2019-07-15 (×6): 80 mg via ORAL
  Filled 2019-07-10 (×7): qty 1

## 2019-07-10 MED ORDER — RANOLAZINE ER 500 MG PO TB12
500.0000 mg | ORAL_TABLET | Freq: Two times a day (BID) | ORAL | Status: DC
  Administered 2019-07-10 – 2019-07-16 (×10): 500 mg via ORAL
  Filled 2019-07-10 (×10): qty 1

## 2019-07-10 MED ORDER — ALLOPURINOL 100 MG PO TABS
100.0000 mg | ORAL_TABLET | Freq: Two times a day (BID) | ORAL | Status: DC
  Administered 2019-07-10 – 2019-07-16 (×10): 100 mg via ORAL
  Filled 2019-07-10 (×11): qty 1

## 2019-07-10 MED ORDER — CLOPIDOGREL BISULFATE 75 MG PO TABS
75.0000 mg | ORAL_TABLET | Freq: Every day | ORAL | Status: DC
  Administered 2019-07-11 – 2019-07-16 (×5): 75 mg via ORAL
  Filled 2019-07-10 (×5): qty 1

## 2019-07-10 MED ORDER — HEPARIN BOLUS VIA INFUSION
4000.0000 [IU] | Freq: Once | INTRAVENOUS | Status: AC
  Administered 2019-07-10: 4000 [IU] via INTRAVENOUS
  Filled 2019-07-10: qty 4000

## 2019-07-10 MED ORDER — ACETAMINOPHEN 325 MG PO TABS
650.0000 mg | ORAL_TABLET | ORAL | Status: DC | PRN
  Administered 2019-07-13: 650 mg via ORAL
  Filled 2019-07-10: qty 2

## 2019-07-10 MED ORDER — NITROGLYCERIN 2 % TD OINT
1.0000 [in_us] | TOPICAL_OINTMENT | Freq: Once | TRANSDERMAL | Status: AC
  Administered 2019-07-10: 15:00:00 1 [in_us] via TOPICAL
  Filled 2019-07-10: qty 1

## 2019-07-10 MED ORDER — OXYMETAZOLINE HCL 0.05 % NA SOLN
1.0000 | Freq: Two times a day (BID) | NASAL | Status: AC | PRN
  Administered 2019-07-10: 1 via NASAL
  Filled 2019-07-10: qty 30

## 2019-07-10 MED ORDER — SODIUM CHLORIDE 0.9% FLUSH
3.0000 mL | Freq: Two times a day (BID) | INTRAVENOUS | Status: DC
  Administered 2019-07-11 – 2019-07-15 (×4): 3 mL via INTRAVENOUS

## 2019-07-10 MED ORDER — INSULIN ASPART PROT & ASPART (70-30 MIX) 100 UNIT/ML ~~LOC~~ SUSP
20.0000 [IU] | Freq: Two times a day (BID) | SUBCUTANEOUS | Status: DC | PRN
  Filled 2019-07-10: qty 10

## 2019-07-10 MED ORDER — GABAPENTIN 300 MG PO CAPS
300.0000 mg | ORAL_CAPSULE | Freq: Every day | ORAL | Status: DC
  Administered 2019-07-11 – 2019-07-16 (×5): 300 mg via ORAL
  Filled 2019-07-10 (×5): qty 1

## 2019-07-10 MED ORDER — SODIUM CHLORIDE 0.9% FLUSH: 3.0000 mL | INTRAVENOUS | Status: DC | PRN

## 2019-07-10 MED ORDER — CARVEDILOL 25 MG PO TABS
50.0000 mg | ORAL_TABLET | Freq: Two times a day (BID) | ORAL | Status: DC
  Filled 2019-07-10: qty 2

## 2019-07-10 MED ORDER — NITROGLYCERIN IN D5W 200-5 MCG/ML-% IV SOLN: 0.0000 ug/min | INTRAVENOUS | Status: DC

## 2019-07-10 MED ORDER — ONDANSETRON HCL 4 MG/2ML IJ SOLN: 4.0000 mg | Freq: Four times a day (QID) | INTRAMUSCULAR | Status: DC | PRN

## 2019-07-10 MED ORDER — ONDANSETRON HCL 4 MG/2ML IJ SOLN
4.0000 mg | Freq: Once | INTRAMUSCULAR | Status: AC
  Administered 2019-07-10: 4 mg via INTRAVENOUS
  Filled 2019-07-10: qty 2

## 2019-07-10 NOTE — ED Notes (Signed)
Pt CBG was 85, notified Leslie(RN)

## 2019-07-10 NOTE — ED Notes (Signed)
Report given to [employer]

## 2019-07-10 NOTE — ED Notes (Addendum)
Called lab about the BNP  Lab said she going to spend it to and run the lab. Lab stated that she wasn't giving the blood.

## 2019-07-10 NOTE — Consult Note (Signed)
[facility] Renal Consultation Note  Requesting MD: Dr Tamala Julian Indication for Consultation: Chronic kidney disease stage IV/V,maintenance of euvolemia, treatment and evaluation of anemia, treatment and evaluation of secondary hyperparathyroidism.  HPI:  Matthew Carbin. is a 58 y.o. male.  With a past medical history pertinent for combined systolic and diastolic heart failure cardiology status post CABG chronic anemia CKD stage IV/V diabetes mellitus type 2 obstructive sleep apnea on CPAP presents with chest discomfort.  Is also reports increased swelling in his lower extremities.  He is followed by Dr. Justin Mend who is his nephrologist.  He continues to make urine and is diuresed with Lasix 80 mg daily.  In the emergency department he was afebrile tachypneic.  He complains of increasing abdominal distention.  White blood count was 7.8 hemoglobin 8.1 BUN 67 creatinine 6.26 glucose 215 troponin increase noted and cardiology called was started on heparin drip.  Blood pressure 95/67 pulse 72 temperature 97.5 O2 sats 93% nasal cannula  Sodium 134 potassium 3.7 chloride 100 CO2 19 BUN 67 creatinine 6.26 calcium 8.9 WBC 7.6 hemoglobin 8.1 platelets 189  Home medications allopurinol 100 mg twice daily amlodipine 10 mg daily aspirin 81 mg atorvastatin 80 mg daily carvedilol 25 mg twice daily Plavix 75 mg daily Lasix 80 mg twice daily, Neurontin 300 mg twice daily hydralazine 50 mg 3 times daily insulin 70/30 20 units twice daily isosorbide 120 mg twice daily multivitamins 1 daily, Zantac 300 mg daily Ranexa 500 mg twice daily Ozempic 0.25 mcg weekly, tramadol 50 mg every 12 hours Ambien 10 mg daily  Chest x-ray stable cardiomegaly    Creat  Date/Time Value Ref Range Status  04/19/2016 11:44 AM 2.47 (H) 0.70 - 1.33 mg/dL Final    Comment:      For patients > or = 59 years of age: The upper reference limit for Creatinine is approximately 13% higher for people identified  as African-American.     01/25/2016 10:08 AM 2.53 (H) 0.70 - 1.33 mg/dL Final  01/23/2016 04:21 PM 2.72 (H) 0.70 - 1.33 mg/dL Final  11/24/2014 02:10 PM 1.92 (H) 0.50 - 1.35 mg/dL Final  10/11/2014 03:00 PM 1.98 (H) 0.50 - 1.35 mg/dL Final  09/30/2013 02:38 PM 2.00 (H) 0.50 - 1.35 mg/dL Final  05/28/2013 01:53 PM 1.96 (H) 0.50 - 1.35 mg/dL Final  03/27/2012 11:11 AM 1.75 (H) 0.50 - 1.35 mg/dL Final   Creatinine, Ser  Date/Time Value Ref Range Status  07/10/2019 09:18 AM 6.26 (H) 0.61 - 1.24 mg/dL Final  06/26/2018 06:38 AM 4.45 (H) 0.61 - 1.24 mg/dL Final  06/25/2018 03:26 AM 4.64 (H) 0.61 - 1.24 mg/dL Final  06/25/2018 03:26 AM 4.69 (H) 0.61 - 1.24 mg/dL Final  06/24/2018 04:21 AM 4.76 (H) 0.61 - 1.24 mg/dL Final  06/23/2018 12:51 AM 4.91 (H) 0.61 - 1.24 mg/dL Final  06/22/2018 04:10 AM 4.52 (H) 0.61 - 1.24 mg/dL Final  06/21/2018 02:12 AM 3.79 (H) 0.61 - 1.24 mg/dL Final  06/20/2018 04:49 AM 4.05 (H) 0.61 - 1.24 mg/dL Final  06/19/2018 04:47 PM 4.04 (H) 0.61 - 1.24 mg/dL Final  06/17/2018 07:34 PM 3.89 (H) 0.61 - 1.24 mg/dL Final  10/18/2017 04:17 AM 3.95 (H) 0.61 - 1.24 mg/dL Final  10/17/2017 03:48 AM 3.78 (H) 0.61 - 1.24 mg/dL Final  10/16/2017 02:58 AM 3.55 (H) 0.61 - 1.24 mg/dL Final  10/15/2017 11:55 AM 3.61 (H) 0.61 - 1.24 mg/dL Final  08/09/2017 11:03 AM 3.59 (H) 0.61 - 1.24 mg/dL Final  04/02/2017 04:12  AM 2.94 (H) 0.61 - 1.24 mg/dL Final  04/01/2017 01:53 PM 3.13 (H) 0.61 - 1.24 mg/dL Final  04/01/2017 03:56 AM 3.11 (H) 0.61 - 1.24 mg/dL Final  03/29/2017 01:52 PM 2.52 (H) 0.76 - 1.27 mg/dL Final  02/21/2017 09:51 AM 2.81 (H) 0.76 - 1.27 mg/dL Final  02/09/2017 03:45 AM 2.76 (H) 0.61 - 1.24 mg/dL Final  02/08/2017 04:13 AM 2.50 (H) 0.61 - 1.24 mg/dL Final  02/07/2017 10:02 PM 2.44 (H) 0.61 - 1.24 mg/dL Final  11/16/2016 07:32 AM 2.14 (H) 0.61 - 1.24 mg/dL Final  11/15/2016 02:19 PM 2.42 (H) 0.61 - 1.24 mg/dL Final  08/10/2016 12:25 AM 2.22 (H) 0.61 - 1.24 mg/dL  Final  08/09/2016 09:08 AM 2.29 (H) 0.61 - 1.24 mg/dL Final  04/04/2016 08:06 AM 2.52 (H) 0.61 - 1.24 mg/dL Final  04/04/2016 01:02 AM 2.64 (H) 0.61 - 1.24 mg/dL Final  03/31/2016 02:18 AM 2.66 (H) 0.61 - 1.24 mg/dL Final  03/30/2016 08:40 AM 2.77 (H) 0.61 - 1.24 mg/dL Final  03/25/2016 05:19 AM 2.10 (H) 0.61 - 1.24 mg/dL Final  03/24/2016 12:37 PM 2.36 (H) 0.61 - 1.24 mg/dL Final  07/17/2015 01:51 PM 2.64 (H) 0.61 - 1.24 mg/dL Final  05/08/2015 08:35 AM 2.67 (H) 0.61 - 1.24 mg/dL Final  05/08/2015 02:38 AM 2.53 (H) 0.61 - 1.24 mg/dL Final  02/15/2015 07:48 PM 2.32 (H) 0.61 - 1.24 mg/dL Final  01/01/2015 03:50 AM 2.19 (H) 0.50 - 1.35 mg/dL Final  12/31/2014 10:51 PM 2.40 (H) 0.50 - 1.35 mg/dL Final  09/19/2014 03:25 AM 2.59 (H) 0.50 - 1.35 mg/dL Final  09/18/2014 02:23 AM 2.22 (H) 0.50 - 1.35 mg/dL Final  08/12/2014 01:01 PM 2.4 (H) 0.4 - 1.5 mg/dL Final  07/08/2014 10:56 AM 2.1 (H) 0.4 - 1.5 mg/dL Final  04/08/2014 04:10 PM 2.2 (H) 0.4 - 1.5 mg/dL Final  01/12/2014 09:29 AM 2.2 (H) 0.4 - 1.5 mg/dL Final  12/15/2013 02:54 PM 2.0 (H) 0.4 - 1.5 mg/dL Final  12/04/2013 03:40 AM 1.80 (H) 0.50 - 1.35 mg/dL Final  12/03/2013 07:45 AM 1.66 (H) 0.50 - 1.35 mg/dL Final  12/02/2013 04:33 AM 1.98 (H) 0.50 - 1.35 mg/dL Final  12/01/2013 09:40 AM 1.88 (H) 0.50 - 1.35 mg/dL Final  11/29/2013 05:16 AM 2.00 (H) 0.50 - 1.35 mg/dL Final     PMHx:   Past Medical History:  Diagnosis Date  . Acute on chronic combined systolic and diastolic CHF (congestive heart failure) ([place]) 10/15/2017  . Chronic anemia   . Chronic combined systolic and diastolic CHF (congestive heart failure) ([place])   . Chronic stable angina ([place])   . CKD (chronic kidney disease), stage IV ([place])   . Coronary artery disease    a. INF MI 1999: PCI of RCA;  b.  extensive stenting of LAD;  c. s/p CABG with RIMA->RCA in 2006;  d. Stable, Low risk MV 05/2012; e. 09/2012 Cath: stable anatomy->med Rx. f. Abnl nuc 11/2013 -> med rx; g. 11/2013  Cath/PCI: s/p DES to RCA. h. Abnormal nuc 11/2016, pt declined cath.   . DM2 (diabetes mellitus, type 2) ([facility])    Type II  . Dyslipidemia   . GERD (gastroesophageal reflux disease)   . History of gout   . Hypertension   . Hypertensive heart disease   . Ischemic cardiomyopathy   . Mild aortic stenosis   . Myocardial infarction [facility]) 1999; 2002; 2003; 2006; ~ 2008  . OSA on CPAP    wears CPAP  . Polycystic kidney disease  Past Surgical History:  Procedure Laterality Date  . AV FISTULA PLACEMENT Left 05/29/2018   Procedure: ARTERIOVENOUS (AV) FISTULA CREATION  LEFT ARM.;  Surgeon: Serafina Mitchell, MD;  Location: [place];  Service: Vascular;  Laterality: Left;  . BASCILIC VEIN TRANSPOSITION Left 07/24/2018   Procedure: SECOND STAGE BASCILIC VEIN TRANSPOSITION LEFT ARM;  Surgeon: Serafina Mitchell, MD;  Location: [place];  Service: Vascular;  Laterality: Left;  . CARDIAC CATHETERIZATION  2012  . CARDIAC CATHETERIZATION  2013  . CARDIAC CATHETERIZATION  2014   LHC (1/14): total occlusion of distal LAD, diffuse disease in ramus, 70% proximal LCx, 50% proximal and mid RCA, 50% distal RCA, patent RIMA-RCA.  Marland Kitchen CORONARY ANGIOPLASTY WITH STENT PLACEMENT  1999 / 2002 / 2004 / 2006?  . CORONARY ARTERY BYPASS GRAFT  2006   CABG X?; in [place]  . CYSTECTOMY  1990's   off face  . HERNIA REPAIR     inguinal hernia  . LEFT HEART CATHETERIZATION WITH CORONARY/GRAFT ANGIOGRAM N/A 09/16/2012   Procedure: LEFT HEART CATHETERIZATION WITH [name];  Surgeon: [name], MD;  Location: [facility] CATH LAB;  Service: Cardiovascular;  Laterality: N/A;  . LEFT HEART CATHETERIZATION WITH CORONARY/GRAFT ANGIOGRAM N/A 12/02/2013   Procedure: LEFT HEART CATHETERIZATION WITH [name];  Surgeon: [name], MD;  Location: [facility] CATH LAB;  Service: Cardiovascular;  Laterality: N/A;  . PERCUTANEOUS CORONARY STENT INTERVENTION (PCI-S) N/A 12/03/2013   Procedure: PERCUTANEOUS CORONARY STENT  INTERVENTION (PCI-S);  Surgeon: [name], MD;  Location: [facility] CATH LAB;  Service: Cardiovascular;  Laterality: N/A;  . RIGHT/LEFT HEART CATH AND CORONARY/GRAFT ANGIOGRAPHY N/A 06/23/2018   Procedure: RIGHT/LEFT HEART CATH AND CORONARY/GRAFT ANGIOGRAPHY;  Surgeon: [name], MD;  Location: [place] CV LAB;  Service: Cardiovascular;  Laterality: N/A;    Family Hx:  Family History  Problem Relation Age of Onset  . Lung cancer Mother   . Anemia Mother   . Polycystic kidney disease Mother   . Diabetes Father   . Heart attack Father 40       Died suddenly  . Heart failure Father   . Hyperlipidemia Father   . Hypertension Father   . Kidney failure Sister 5       Died  . Heart attack Sister   . Hypertension Sister   . Diabetes Sister   . Diabetes Brother   . Polycystic kidney disease Brother   . Diabetes Sister     Social History:  reports that he has quit smoking. His smoking use included cigarettes. He has never used smokeless tobacco. He reports that he does not drink alcohol or use drugs.  Allergies:  Allergies  Allergen Reactions  . Cleocin [Clindamycin Hcl] Anaphylaxis and Swelling  . Glimepiride [Amaryl] Other (See Comments)    Elevates liver function   . Clarithromycin Itching and Other (See Comments)    "Biaxin" Eyes itch and burn  . Colchicine Other (See Comments)    Affected kidneys   . Ciprocin-Fluocin-Procin [Fluocinolone Acetonide] Rash    Medications: Prior to Admission medications   Medication Sig Start Date End Date Taking? Authorizing Provider  acetaminophen (TYLENOL) 500 MG tablet Take 500 mg by mouth 2 (two) times daily as needed (for pain or headaches).    Yes [provider]  allopurinol (ZYLOPRIM) 100 MG tablet Take 100 mg by mouth 2 (two) times daily.    Yes [provider]  amLODipine (NORVASC) 10 MG tablet Take 1 tablet (10 mg total) by  mouth daily. 01/12/15  Yes Copland, Gay Filler, MD  aspirin EC 81 MG EC  tablet Take 1 tablet (81 mg total) by mouth daily. 10/19/17  Yes Kilroy, Luke K, PA-C  atorvastatin (LIPITOR) 80 MG tablet Take 1 tablet (80 mg total) by mouth daily. Patient taking differently: Take 80 mg by mouth at bedtime.  08/26/17  Yes Dunn, Dayna N, PA-C  carvedilol (COREG) 25 MG tablet Take 2 tablets (50 mg total) by mouth 2 (two) times daily. 04/01/19  Yes [name], MD  Cholecalciferol 2000 units TABS Take 2,000 Units by mouth daily.    Yes [provider]  clopidogrel (PLAVIX) 75 MG tablet TAKE ONE TABLET BY MOUTH ONCE DAILY WITH BREAKFAST Patient taking differently: Take 75 mg by mouth daily.  12/25/16  Yes End, Harrell Gave, MD  diphenhydrAMINE (BENADRYL) 25 MG tablet Take 25 mg by mouth every 6 (six) hours as needed for allergies or sleep.    Yes [provider]  furosemide (LASIX) 80 MG tablet Take 1 tablet (80 mg total) by mouth 2 (two) times daily. 08/05/18  Yes End, Harrell Gave, MD  gabapentin (NEURONTIN) 300 MG capsule Take 1 capsule (300 mg total) by mouth 2 (two) times daily. Patient taking differently: Take 300 mg by mouth daily.  04/03/17  Yes Eulogio Bear U, DO  hydrALAZINE (APRESOLINE) 50 MG tablet Take 1 tablet (50 mg total) by mouth 3 (three) times daily. 12/20/16  Yes End, Harrell Gave, MD  insulin aspart protamine- aspart (NOVOLOG MIX 70/30) (70-30) 100 UNIT/ML injection Inject 20 Units into the skin 2 (two) times daily as needed (if blood sugar is over 180).   Yes [provider]  isosorbide mononitrate (IMDUR) 120 MG 24 hr tablet Take 1 tablet (120 mg total) by mouth 2 (two) times daily. 08/26/17  Yes Dunn, Dayna N, PA-C  Multiple Vitamin (MULTIVITAMIN WITH MINERALS) TABS tablet Take 1 tablet by mouth daily.   Yes [provider]  nitroGLYCERIN (NITROLINGUAL) 0.4 MG/SPRAY spray Place 1 spray under the tongue every 5 (five) minutes x 3 doses as needed for chest pain.   Yes [provider]  nitroGLYCERIN (NITROSTAT) 0.4 MG  SL tablet Place 1 tablet (0.4 mg total) under the tongue every 5 (five) minutes as needed for chest pain. 06/06/18  Yes End, Harrell Gave, MD  pantoprazole (PROTONIX) 40 MG tablet Take 40 mg by mouth daily.    Yes [provider]  ranitidine (ZANTAC) 300 MG tablet Take 300 mg by mouth at bedtime.   Yes [provider]  ranolazine (RANEXA) 500 MG 12 hr tablet Take 1 tablet (500 mg total) by mouth 2 (two) times daily. 03/29/17  Yes End, Harrell Gave, MD  Semaglutide (OZEMPIC, 0.25 OR 0.5 MG/DOSE, [place]) Inject 0.25 mg into the skin every Saturday.   Yes [provider]  traMADol (ULTRAM) 50 MG tablet Take 1 tablet (50 mg total) by mouth every 12 (twelve) hours as needed for moderate pain. 06/26/18  Yes Patrecia Pour, MD  vitamin E 400 UNIT capsule Take 400 Units by mouth daily.   Yes [provider]  zolpidem (AMBIEN) 10 MG tablet Take 10 mg by mouth at bedtime as needed for sleep.  09/30/17  Yes [provider]  [place]. Devices MISC by Does not apply route. C-PAP    [provider]     Labs:  Results for orders placed or performed during the hospital encounter of 07/10/19 (from the past 48 hour(s))  Basic metabolic panel  Status: Abnormal   Collection Time: 07/10/19  9:18 AM  Result Value Ref Range   Sodium 134 (L) 135 - 145 mmol/L   Potassium 3.7 3.5 - 5.1 mmol/L   Chloride 100 98 - 111 mmol/L   CO2 19 (L) 22 - 32 mmol/L   Glucose, Bld 215 (H) 70 - 99 mg/dL   BUN 67 (H) 6 - 20 mg/dL   Creatinine, Ser 6.26 (H) 0.61 - 1.24 mg/dL   Calcium 8.9 8.9 - 10.3 mg/dL   GFR calc non Af Amer 9 (L) >60 mL/min   GFR calc Af Amer 10 (L) >60 mL/min   Anion gap 15 5 - 15    Comment: Performed at [facility] [address], [place], [place] [address]  CBC     Status: Abnormal   Collection Time: 07/10/19  9:18 AM  Result Value Ref Range   WBC 7.6 4.0 - 10.5 K/uL   RBC 2.88 (L) 4.22 - 5.81 MIL/uL   Hemoglobin 8.1 (L) 13.0 - 17.0 g/dL   HCT 25.5  (L) 39.0 - 52.0 %   MCV 88.5 80.0 - 100.0 fL   MCH 28.1 26.0 - 34.0 pg   MCHC 31.8 30.0 - 36.0 g/dL   RDW 15.5 11.5 - 15.5 %   Platelets 189 150 - 400 K/uL   nRBC 0.0 0.0 - 0.2 %    Comment: Performed at [facility], [place] [address] [place], [place] [address]  Troponin I (High Sensitivity)     Status: Abnormal   Collection Time: 07/10/19  9:18 AM  Result Value Ref Range   Troponin I (High Sensitivity) 26 (H) <18 ng/L    Comment: (NOTE) Elevated high sensitivity troponin I (hsTnI) values and significant  changes across serial measurements may suggest ACS but many other  chronic and acute conditions are known to elevate hsTnI results.  Refer to the "Links" section for chest pain algorithms and additional  guidance. Performed at [facility], [place] [address], [place], [place] [address]   Brain natriuretic peptide     Status: Abnormal   Collection Time: 07/10/19  9:18 AM  Result Value Ref Range   B Natriuretic Peptide 1,468.8 (H) 0.0 - 100.0 pg/mL    Comment: Performed at [facility] [address] [place], [place] [address]  Troponin I (High Sensitivity)     Status: Abnormal   Collection Time: 07/10/19 11:18 AM  Result Value Ref Range   Troponin I (High Sensitivity) 130 (HH) <18 ng/L    Comment: CRITICAL RESULT CALLED TO, READ BACK BY AND VERIFIED WITH: SHULAR,L RN @ [id number] 07/10/19 LEOANRD,A (NOTE) Elevated high sensitivity troponin I (hsTnI) values and significant  changes across serial measurements may suggest ACS but many other  chronic and acute conditions are known to elevate hsTnI results.  Refer to the Links section for chest pain algorithms and additional  guidance. Performed at [facility], [place] [address], [place], [place] [address]   SARS Coronavirus 2 by RT PCR (hospital order, performed in [facility] hospital lab) Nasopharyngeal Nasopharyngeal Swab     Status: None   Collection Time: 07/10/19  1:30 PM   Specimen: Nasopharyngeal Swab   Result Value Ref Range   SARS Coronavirus 2 NEGATIVE NEGATIVE    Comment: (NOTE) If result is NEGATIVE SARS-CoV-2 target nucleic acids are NOT DETECTED. The SARS-CoV-2 RNA is generally detectable in upper and lower  respiratory specimens during the acute phase of  infection. The lowest  concentration of SARS-CoV-2 viral copies this assay can detect is 250  copies / mL. A negative result does not preclude SARS-CoV-2 infection  and should not be used as the sole basis for treatment or other  patient management decisions.  A negative result may occur with  improper specimen collection / handling, submission of specimen other  than nasopharyngeal swab, presence of viral mutation(s) within the  areas targeted by this assay, and inadequate number of viral copies  (<250 copies / mL). A negative result must be combined with clinical  observations, patient history, and epidemiological information. If result is POSITIVE SARS-CoV-2 target nucleic acids are DETECTED. The SARS-CoV-2 RNA is generally detectable in upper and lower  respiratory specimens dur ing the acute phase of infection.  Positive  results are indicative of active infection with SARS-CoV-2.  Clinical  correlation with patient history and other diagnostic information is  necessary to determine patient infection status.  Positive results do  not rule out bacterial infection or co-infection with other viruses. If result is PRESUMPTIVE POSTIVE SARS-CoV-2 nucleic acids MAY BE PRESENT.   A presumptive positive result was obtained on the submitted specimen  and confirmed on repeat testing.  While 2019 novel coronavirus  (SARS-CoV-2) nucleic acids may be present in the submitted sample  additional confirmatory testing may be necessary for epidemiological  and / or clinical management purposes  to differentiate between  SARS-CoV-2 and other Sarbecovirus currently known to infect humans.  If clinically indicated additional testing with  an alternate test  methodology [phone]) is advised. The SARS-CoV-2 RNA is generally  detectable in upper and lower respiratory sp ecimens during the acute  phase of infection. The expected result is Negative. Fact Sheet for Patients:  StrictlyIdeas.no Fact Sheet for Healthcare Providers: BankingDealers.co.za This test is not yet approved or cleared by the [place] FDA and has been authorized for detection and/or diagnosis of SARS-CoV-2 by FDA under an Emergency Use Authorization (EUA).  This EUA will remain in effect (meaning this test can be used) for the duration of the COVID-19 declaration under Section 564(b)(1) of the Act, 21 U.S.C. section 360bbb-3(b)(1), unless the authorization is terminated or revoked sooner. Performed at [facility], [place] [address], [place], [place] [address]   POC CBG, ED     Status: None   Collection Time: 07/10/19  6:16 PM  Result Value Ref Range   Glucose-Capillary 85 70 - 99 mg/dL   Comment 1 Notify RN    Comment 2 Document in Chart      ROS:  Constitutional positive for fatigue and malaise negative fever sweats or chills Head/ENT negative for congestion sinus pain epistaxis sore throat mouth lesions Eyes negative for photophobia pain loss of vision Respiratory positive for nonproductive cough shortness of breath dyspnea on exertion dyspnea at rest Cardiovascular positive for chest pain positive increased lower extremity swelling Gastrointestinal negative for abdominal pain nausea vomiting increased abdominal distention Genitourinary negative for dysuria hematuria frequency urgency Musculoskeletal negative for falls pain weakness joint swelling joint discomfort Dermatologic negative for skin rash or itching Neurologic negative for focalityLoss of consciousness  Hematologic negative for cancer negative for DVT   Physical Exam: Vitals:   07/10/19 1800 07/10/19 2016  BP: 126/83 95/67   Pulse: 74 72  Resp: 15 19  Temp:  (!) 97.5 F (36.4 C)  SpO2: 90% 93%     General: HEENT: Mucous membranes dry posterior pharynx without any exudates or lesions Eyes:Pupils round equal  reactive conjunctiva normal Neck: Supple nontender no masses no thyromegaly JVP increased slightly 4 to 6 cm Heart: Regular rate and rhythm 2+ pitting edema lower extremities 2+ peripheral pulses AV fistula left upper extremity with thrill and bruit Lungs: Positive crackles heard from mid to lower lungs Abdomen: Abdominal distention positive bowel sounds diffusely tympanic Extremities: Notable for lower extremity edema 2+ Skin: No lesions noted Neuro: Alert and oriented x3 grossly intact with no focal lesions  Assessment/Plan: 1.Chronic kidney disease stage IV/V progression since she is diabetic nephropathy.  Patient has had progression of the renal disease has an AV fistula which is ready to start dialysis when needed.  It appears that his kidney disease is progressed.  We will see if he responds to diuretics.  We may need to start dialysis in the hospital.  Renally adjust medications. 2.  Anemia we will check some iron studies 3.  Bones check PTH continue to follow calcium and phosphorus 4.  Abdominal distention appears like an ileus.  Will check plain x-rays. 5.  Diabetes mellitus as per primary team 6.  Coronary artery disease with history of CABG last cardiac cath 06/2018 continue aspirin Plavix and statin 7.  NSTEMI acute high-sensitivity troponins trending up cardiology consulted Heparin drip started echocardiogram pending continue aspirin Plavix beta-blocker Ranexa cardiology to make further recommendations.  I anticipate that he is going to need to start dialysis. 8.  Congestive heart failure with diastolic dysfunction increasing lower extremity edema.  IV Lasix administered x1 although chest x-ray does not look particularly worrisome 9.  Hypertension/volume continue to follow continue  diuretics 10.  Obstructive sleep apnea requiring CPAP 11.  Patient been given Protonix.    Sherril Croon 07/10/2019, 8:30 PM

## 2019-07-10 NOTE — ED Triage Notes (Signed)
Pt reports around 12am he began to have chest pain across the middle of his chest. Pt states it was so uncomfortable he could not lay down and did not sleep all night-  At 0500 the pain got worse and made him come into the ER this morning. Pt has taken daily meds along with 2 nitro tablets and 3 sprays of nitro PTA pt also reports " I may have taken to much because last night I was using the spray every half hour".

## 2019-07-10 NOTE — ED Provider Notes (Addendum)
[place] EMERGENCY DEPARTMENT Provider Note   CSN: [id number] Arrival date & time: 07/10/19  0908     History   Chief Complaint Chief Complaint  Patient presents with  . Chest Pain    HPI Matthew Rowe. is a 58 y.o. male.     The history is provided by the patient.  Chest Pain Pain location:  Substernal area Pain quality: pressure   Pain radiates to:  Does not radiate Pain severity:  Moderate Onset quality:  Gradual Duration:  8 hours Timing:  Constant Progression:  Unchanged Chronicity:  Recurrent Context: at rest   Relieved by:  Nothing Worsened by:  Nothing Ineffective treatments:  Nitroglycerin Associated symptoms: no abdominal pain, no back pain, no cough, no fever, no palpitations, no shortness of breath and no vomiting   Risk factors: coronary artery disease, high cholesterol and hypertension     Past Medical History:  Diagnosis Date  . Acute on chronic combined systolic and diastolic CHF (congestive heart failure) ([place]) 10/15/2017  . Chronic anemia   . Chronic combined systolic and diastolic CHF (congestive heart failure) ([place])   . Chronic stable angina ([place])   . CKD (chronic kidney disease), stage IV ([place])   . Coronary artery disease    a. INF MI 1999: PCI of RCA;  b.  extensive stenting of LAD;  c. s/p CABG with RIMA->RCA in 2006;  d. Stable, Low risk MV 05/2012; e. 09/2012 Cath: stable anatomy->med Rx. f. Abnl nuc 11/2013 -> med rx; g. 11/2013 Cath/PCI: s/p DES to RCA. h. Abnormal nuc 11/2016, pt declined cath.   . DM2 (diabetes mellitus, type 2) (HCC)    Type II  . Dyslipidemia   . GERD (gastroesophageal reflux disease)   . History of gout   . Hypertension   . Hypertensive heart disease   . Ischemic cardiomyopathy   . Mild aortic stenosis   . Myocardial infarction [facility]) 1999; 2002; 2003; 2006; ~ 2008  . OSA on CPAP    wears CPAP  . Polycystic kidney disease     Patient Active Problem List   Diagnosis Date Noted  .  Coronary artery disease involving native heart with unstable angina pectoris ([place])   . Acute respiratory failure ([place])   . Acute diastolic (congestive) heart failure ([place])   . Unstable angina ([place]) 06/18/2018  . Leukocytosis 06/18/2018  . Chronic pain 06/18/2018  . Insomnia 06/18/2018  . Chest pain 06/17/2018  . Chronic heart failure with preserved ejection fraction ([place]) 06/06/2018  . Anemia due to stage 4 chronic kidney disease ([place]) 02/12/2018  . Coronary artery disease of native heart with stable angina pectoris ([place]) 10/25/2017  . Chronic diastolic heart failure ([place]) 10/25/2017  . Chronic anemia 10/15/2017  . Elevated troponin 10/15/2017  . CKD (chronic kidney disease) stage 4, GFR 15-29 ml/min (HCC) 07/22/2017  . Hyperkalemia 04/01/2017  . Non-ST elevation (NSTEMI) myocardial infarction ([place]) 04/01/2017  . Chest pain with moderate risk of acute coronary syndrome 04/01/2017  . Hx of CABG   . Ischemic cardiomyopathy 02/21/2017  . HLD (hyperlipidemia)   . GERD (gastroesophageal reflux disease) 05/08/2015  . Gout 05/08/2015  . MI (myocardial infarction) ([place]) 04/19/2015  . CAD- S/P multiple PCIs, CABG   . Hypertensive heart disease   . Precordial chest pain 11/27/2013  . Financial difficulty 03/13/2013  . Medically noncompliant 03/13/2013  . Type 2 diabetes with nephropathy ([place])   . Obesity-BMI 42 11/27/2012  . Polycystic kidney disease 09/15/2012  .  OSA on CPAP 09/15/2012  . Hypertension 03/27/2011  . Hyperlipidemia associated with type 2 diabetes mellitus [facility])     Past Surgical History:  Procedure Laterality Date  . AV FISTULA PLACEMENT Left 05/29/2018   Procedure: ARTERIOVENOUS (AV) FISTULA CREATION  LEFT ARM.;  Surgeon: Serafina Mitchell, MD;  Location: [facility];  Service: Vascular;  Laterality: Left;  . BASCILIC VEIN TRANSPOSITION Left 07/24/2018   Procedure: SECOND STAGE BASCILIC VEIN TRANSPOSITION LEFT ARM;  Surgeon: Serafina Mitchell, MD;  Location: [facility];  Service:  Vascular;  Laterality: Left;  . CARDIAC CATHETERIZATION  2012  . CARDIAC CATHETERIZATION  2013  . CARDIAC CATHETERIZATION  2014   LHC (1/14): total occlusion of distal LAD, diffuse disease in ramus, 70% proximal LCx, 50% proximal and mid RCA, 50% distal RCA, patent RIMA-RCA.  Marland Kitchen CORONARY ANGIOPLASTY WITH STENT PLACEMENT  1999 / 2002 / 2004 / 2006?  . CORONARY ARTERY BYPASS GRAFT  2006   CABG X?; in [place]  . CYSTECTOMY  1990's   off face  . HERNIA REPAIR     inguinal hernia  . LEFT HEART CATHETERIZATION WITH CORONARY/GRAFT ANGIOGRAM N/A 09/16/2012   Procedure: LEFT HEART CATHETERIZATION WITH [name];  Surgeon: [name], MD;  Location: [facility] CATH LAB;  Service: Cardiovascular;  Laterality: N/A;  . LEFT HEART CATHETERIZATION WITH CORONARY/GRAFT ANGIOGRAM N/A 12/02/2013   Procedure: LEFT HEART CATHETERIZATION WITH [name];  Surgeon: [name], MD;  Location: [facility] CATH LAB;  Service: Cardiovascular;  Laterality: N/A;  . PERCUTANEOUS CORONARY STENT INTERVENTION (PCI-S) N/A 12/03/2013   Procedure: PERCUTANEOUS CORONARY STENT INTERVENTION (PCI-S);  Surgeon: [name], MD;  Location: [facility] CATH LAB;  Service: Cardiovascular;  Laterality: N/A;  . RIGHT/LEFT HEART CATH AND CORONARY/GRAFT ANGIOGRAPHY N/A 06/23/2018   Procedure: RIGHT/LEFT HEART CATH AND CORONARY/GRAFT ANGIOGRAPHY;  Surgeon: [name], MD;  Location: [facility] CV LAB;  Service: Cardiovascular;  Laterality: N/A;        Home Medications    Prior to Admission medications   Medication Sig Start Date End Date Taking? Authorizing Provider  acetaminophen (TYLENOL) 500 MG tablet Take 500 mg by mouth 2 (two) times daily as needed (for pain or headaches).    Yes [provider]  allopurinol (ZYLOPRIM) 100 MG tablet Take 100 mg by mouth 2 (two) times daily.    Yes [provider]  amLODipine (NORVASC) 10 MG tablet Take 1 tablet (10 mg total) by mouth daily. 01/12/15   Yes Copland, Gay Filler, MD  aspirin EC 81 MG EC tablet Take 1 tablet (81 mg total) by mouth daily. 10/19/17  Yes Kilroy, Luke K, PA-C  atorvastatin (LIPITOR) 80 MG tablet Take 1 tablet (80 mg total) by mouth daily. Patient taking differently: Take 80 mg by mouth at bedtime.  08/26/17  Yes Dunn, Dayna N, PA-C  carvedilol (COREG) 25 MG tablet Take 2 tablets (50 mg total) by mouth 2 (two) times daily. 04/01/19  Yes [name], MD  Cholecalciferol 2000 units TABS Take 2,000 Units by mouth daily.    Yes [provider]  clopidogrel (PLAVIX) 75 MG tablet TAKE ONE TABLET BY MOUTH ONCE DAILY WITH BREAKFAST Patient taking differently: Take 75 mg by mouth daily.  12/25/16  Yes End, Harrell Gave, MD  diphenhydrAMINE (BENADRYL) 25 MG tablet Take 25 mg by mouth every 6 (six) hours as needed for allergies or sleep.    Yes [provider]  furosemide (LASIX) 80 MG tablet Take 1 tablet (80 mg total)  by mouth 2 (two) times daily. 08/05/18  Yes End, Harrell Gave, MD  gabapentin (NEURONTIN) 300 MG capsule Take 1 capsule (300 mg total) by mouth 2 (two) times daily. Patient taking differently: Take 300 mg by mouth daily.  04/03/17  Yes Eulogio Bear U, DO  hydrALAZINE (APRESOLINE) 50 MG tablet Take 1 tablet (50 mg total) by mouth 3 (three) times daily. 12/20/16  Yes End, Harrell Gave, MD  insulin aspart protamine- aspart (NOVOLOG MIX 70/30) (70-30) 100 UNIT/ML injection Inject 20 Units into the skin 2 (two) times daily as needed (if blood sugar is over 180).   Yes [provider]  isosorbide mononitrate (IMDUR) 120 MG 24 hr tablet Take 1 tablet (120 mg total) by mouth 2 (two) times daily. 08/26/17  Yes Dunn, Dayna N, PA-C  Multiple Vitamin (MULTIVITAMIN WITH MINERALS) TABS tablet Take 1 tablet by mouth daily.   Yes [provider]  nitroGLYCERIN (NITROLINGUAL) 0.4 MG/SPRAY spray Place 1 spray under the tongue every 5 (five) minutes x 3 doses as needed for chest pain.   Yes [provider]  nitroGLYCERIN (NITROSTAT) 0.4 MG SL tablet Place 1 tablet (0.4 mg total) under the tongue every 5 (five) minutes as needed for chest pain. 06/06/18  Yes End, Harrell Gave, MD  pantoprazole (PROTONIX) 40 MG tablet Take 40 mg by mouth daily.    Yes [provider]  ranitidine (ZANTAC) 300 MG tablet Take 300 mg by mouth at bedtime.   Yes [provider]  ranolazine (RANEXA) 500 MG 12 hr tablet Take 1 tablet (500 mg total) by mouth 2 (two) times daily. 03/29/17  Yes End, Harrell Gave, MD  Semaglutide (OZEMPIC, 0.25 OR 0.5 MG/DOSE, [place]) Inject 0.25 mg into the skin every Saturday.   Yes [provider]  traMADol (ULTRAM) 50 MG tablet Take 1 tablet (50 mg total) by mouth every 12 (twelve) hours as needed for moderate pain. 06/26/18  Yes Patrecia Pour, MD  vitamin E 400 UNIT capsule Take 400 Units by mouth daily.   Yes [provider]  zolpidem (AMBIEN) 10 MG tablet Take 10 mg by mouth at bedtime as needed for sleep.  09/30/17  Yes [provider]  [place]. Devices MISC by Does not apply route. C-PAP    [provider]    Family History Family History  Problem Relation Age of Onset  . Lung cancer Mother   . Anemia Mother   . Polycystic kidney disease Mother   . Diabetes Father   . Heart attack Father 75       Died suddenly  . Heart failure Father   . Hyperlipidemia Father   . Hypertension Father   . Kidney failure Sister 5       Died  . Heart attack Sister   . Hypertension Sister   . Diabetes Sister   . Diabetes Brother   . Polycystic kidney disease Brother   . Diabetes Sister     Social History Social History   Tobacco Use  . Smoking status: Former Smoker    Types: Cigarettes  . Smokeless tobacco: Never Used  . Tobacco comment: smoked some as a teenager (high school) not even a pack a week  Substance Use Topics  . Alcohol use: No    Alcohol/week: 0.0 standard drinks    Comment: 09/15/2012 "drank a little when I was  young"  . Drug use: No     Allergies   Cleocin [clindamycin hcl], Glimepiride [amaryl], Clarithromycin, Colchicine, and Ciprocin-fluocin-procin [fluocinolone acetonide]  Review of Systems Review of Systems  Constitutional: Negative for chills and fever.  HENT: Negative for ear pain and sore throat.   Eyes: Negative for pain and visual disturbance.  Respiratory: Negative for cough and shortness of breath.   Cardiovascular: Positive for chest pain. Negative for palpitations.  Gastrointestinal: Negative for abdominal pain and vomiting.  Genitourinary: Negative for dysuria and hematuria.  Musculoskeletal: Negative for arthralgias and back pain.  Skin: Negative for color change and rash.  Neurological: Negative for seizures and syncope.  All other systems reviewed and are negative.    Physical Exam Updated Vital Signs  ED Triage Vitals  Enc Vitals Group     BP      Pulse      Resp      Temp      Temp src      SpO2      Weight      Height      Head Circumference      Peak Flow      Pain Score      Pain Loc      Pain Edu?      Excl. in [place]?     Physical Exam Vitals signs and nursing note reviewed.  Constitutional:      Appearance: He is well-developed.  HENT:     Head: Normocephalic and atraumatic.  Eyes:     Extraocular Movements: Extraocular movements intact.     Conjunctiva/sclera: Conjunctivae normal.     Pupils: Pupils are equal, round, and reactive to light.  Neck:     Musculoskeletal: Normal range of motion and neck supple.  Cardiovascular:     Rate and Rhythm: Normal rate and regular rhythm.     Pulses:          Radial pulses are 2+ on the right side and 2+ on the left side.     Heart sounds: Normal heart sounds. No murmur.  Pulmonary:     Effort: Pulmonary effort is normal. No respiratory distress.     Breath sounds: Normal breath sounds. No decreased breath sounds, wheezing, rhonchi or rales.  Abdominal:     Palpations: Abdomen is soft.      Tenderness: There is no abdominal tenderness.  Musculoskeletal:     Right lower leg: No edema.     Left lower leg: No edema.  Skin:    General: Skin is warm and dry.     Capillary Refill: Capillary refill takes less than 2 seconds.  Neurological:     General: No focal deficit present.     Mental Status: He is alert.  Psychiatric:        Mood and Affect: Mood normal.      ED Treatments / Results  Labs (all labs ordered are listed, but only abnormal results are displayed) Labs Reviewed  BASIC METABOLIC PANEL - Abnormal; Notable for the following components:      Result Value   Sodium 134 (*)    CO2 19 (*)    Glucose, Bld 215 (*)    BUN 67 (*)    Creatinine, Ser 6.26 (*)    GFR calc non Af Amer 9 (*)    GFR calc Af Amer 10 (*)    All other components within normal limits  CBC - Abnormal; Notable for the following components:   RBC 2.88 (*)    Hemoglobin 8.1 (*)    HCT 25.5 (*)    All other components within normal limits  TROPONIN I (HIGH SENSITIVITY) - Abnormal; Notable for the following components:   Troponin I (High Sensitivity) 26 (*)    All other components within normal limits  TROPONIN I (HIGH SENSITIVITY) - Abnormal; Notable for the following components:   Troponin I (High Sensitivity) 130 (*)    All other components within normal limits  SARS CORONAVIRUS 2 BY RT PCR (HOSPITAL ORDER, [place] LAB)  BRAIN NATRIURETIC PEPTIDE    EKG EKG Interpretation  Date/Time:  Friday July 10 2019 09:15:34 EDT Ventricular Rate:  88 PR Interval:  190 QRS Duration: 132 QT Interval:  378 QTC Calculation: 457 R Axis:   149 Text Interpretation: Normal sinus rhythm Possible Left atrial enlargement Non-specific intra-ventricular conduction block Cannot rule out Anterior infarct , age undetermined Abnormal ECG Confirmed by Lennice Sites [phone]) on 07/10/2019 9:20:26 AM   Radiology Dg Chest 2 View  Result Date: 07/10/2019 CLINICAL DATA:  Chest  pain. Swelling in the legs and feet. EXAM: CHEST - 2 VIEW COMPARISON:  06/22/2018 FINDINGS: Stable enlarged cardiac silhouette and post CABG changes. Anterior epicardial pacemaker lead. Coronary artery stents. Mild diffuse peribronchial thickening and accentuation of the interstitial markings with improvement. Normal vasculature. No definite pleural fluid. Unremarkable bones. IMPRESSION: 1. Stable cardiomegaly. 2. Improved changes of congestive heart failure superimposed on mild chronic interstitial lung disease. Electronically Signed   By: Claudie Revering M.D.   On: 07/10/2019 09:49    Procedures .Critical Care Performed by: Lennice Sites, DO Authorized by: Lennice Sites, DO   Critical care provider statement:    Critical care time (minutes):  40   Critical care was necessary to treat or prevent imminent or life-threatening deterioration of the following conditions:  Cardiac failure   Critical care was time spent personally by me on the following activities:  Blood draw for specimens, development of treatment plan with patient or surrogate, discussions with primary provider, discussions with consultants, evaluation of patient's response to treatment, examination of patient, obtaining history from patient or surrogate, ordering and performing treatments and interventions, ordering and review of laboratory studies, ordering and review of radiographic studies, re-evaluation of patient's condition, pulse oximetry and review of old charts   I assumed direction of critical care for this patient from another provider in my specialty: no     (including critical care time)  Medications Ordered in ED Medications  sodium chloride flush (NS) 0.9 % injection 3 mL (3 mLs Intravenous Not Given 07/10/19 0929)  morphine 4 MG/ML injection 6 mg (6 mg Intravenous Given 07/10/19 0951)  ondansetron (ZOFRAN) injection 4 mg (4 mg Intravenous Given 07/10/19 1010)     Initial Impression / Assessment and Plan / ED  Course  I have reviewed the triage vital signs and the nursing notes.  Pertinent labs & imaging results that were available during my care of the patient were reviewed by me and considered in my medical decision making (see chart for details).     JEARLD ROMBERG Sr. is a 58 year old male with history of CAD status post CABG and multiple stents who presents to the ED with chest pain.  Pain ongoing for 8 hours despite multiple doses of nitroglycerin at home.  Had recent heart cath a year ago with multivessel disease.  Has chronic angina but states that his pain usually gets better after 2 or 3 doses of nitroglycerin but has been persistent.  Pressure-like feeling.  Denies any shortness of breath, abdominal pain.  Recently got a fistula placed as  he may need to start dialysis but has not started dialysis.  EKG shows sinus rhythm.  No signs of new ischemic changes.  Will get lab work including troponin, x-ray.  Will touch base with cardiology given ongoing anginal pain.  Will treat with IV morphine and reevaluate.  Low concern for PE or infectious process.  Lab work shows mildly increased creatinine from baseline.  Hemoglobin is around baseline.  Was about 8.7 several days ago primary care doctor's office.  Patient with initial troponin of 26 with repeat troponin around 140.  Talked with cardiology who will come to evaluate the patient.  They would hold on heparin at this time.  Chest pain has improved with morphine.  Otherwise lab work is unremarkable.  Chest x-ray shows no signs of pneumonia, no signs of major volume overload.  Does appear to have worsening renal function as well.  Will admit to hospitalist for further care.  Cardiology has evaluated the patient they recommend heparin and nitroglycerin IV infusion for pain control and blood pressure control.  Family medicine team will not admit the patient given nitroglycerin infusion.  We will touch base with cardiology.  Cardiology states that it is  okay to do Nitropaste instead of nitro infusion.  Will be admitted to stepdown with family medicine team  This chart was dictated using voice recognition software.  Despite best efforts to proofread,  errors can occur which can change the documentation meaning.    Final Clinical Impressions(s) / ED Diagnoses   Final diagnoses:  Unstable angina [facility])  Elevated troponin    ED Discharge Orders    None       Lennice Sites, DO 07/10/19 [place], [place], DO 07/10/19 [place], [place], DO 07/10/19 1451

## 2019-07-10 NOTE — Consult Note (Signed)
Cardiology Consultation:   Patient ID: Matthew MALETTE Sr. MRN: [id number]; DOB: 12-Jan-1961  Admit date: 07/10/2019 Date of Consult: 07/10/2019  Primary Care Provider: [name], MD Primary Cardiologist: Larae Grooms, MD   Patient Profile:   Matthew Cote. is a 58 y.o. 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 Ronnald Nian.  In October 2019, he had an episode of chest discomfort when he went to the [facility].  It was 10 out of 10 while he was having a stress test done.  Left heart catheterization was done showing complex LAD disease.  Given that his anterior wall was thought to be partially nonviable due to prior infarction, and treating the LAD stenosis would JL a large ramus territory which was viable, medical therapy was opted for.   He was doing well on cardiac stand point last seen by Dr. Irish Lack 08/2018.  History of Present Illness:   Matthew Rowe presented for evaluation of chest pain.  He was laying when he started to having substernal chest pressure with shortness of breath midnight.  He took 2-3 sublingual nitroglycerin and 2/3 nitroglycerin spray with minimal improvement over the past few hours.  Due to ongoing chest pain he came to ER for further evaluation.  He reports PCP has changed his Lasix 80 mg daily to 40 twice a day about few weeks ago. His breathing has worsened since then.  Patient requires intermittent nitroglycerin once every week.  He was treated with antibiotic for sinus infection 4 weeks ago.  He has chronic cough.  Denies fever, chills or Covid exposure.  Compliant with his medication.  Symptoms similar to prior angina.  Did not complain of 4 out of  10 chest pain.  He was given morphine in ER.  High-sensitivity troponin 26->130.  BUN/Serum creatinine 67/6.26. Hgb 8.1. blood pressure normal.    Heart Pathway Score:     Past Medical History:  Diagnosis Date  . Acute on chronic combined systolic and diastolic CHF (congestive heart failure) ([place]) 10/15/2017  . Chronic anemia   . Chronic combined systolic and diastolic CHF (congestive heart failure) ([place])   . Chronic stable angina ([place])   . CKD (chronic kidney disease), stage IV ([place])   . Coronary artery disease    a. INF MI 1999: PCI of RCA;  b.  extensive stenting of LAD;  c. s/p CABG with RIMA->RCA in 2006;  d. Stable, Low risk MV 05/2012; e. 09/2012 Cath: stable anatomy->med Rx. f. Abnl nuc 11/2013 -> med rx; g. 11/2013 Cath/PCI: s/p DES to RCA. h. Abnormal nuc 11/2016, pt declined cath.   . DM2 (diabetes mellitus, type 2) ([facility])    Type II  . Dyslipidemia   . GERD (gastroesophageal reflux disease)   . History of gout   . Hypertension   . Hypertensive heart disease   . Ischemic cardiomyopathy   . Mild aortic stenosis   . Myocardial infarction [facility]) 1999; 2002; 2003; 2006; ~ 2008  . OSA on CPAP    wears CPAP  . Polycystic kidney disease     Past Surgical History:  Procedure Laterality Date  . AV FISTULA PLACEMENT Left 05/29/2018   Procedure: ARTERIOVENOUS (AV) FISTULA CREATION  LEFT ARM.;  Surgeon: Serafina Mitchell, MD;  Location: [facility] OR;  Service: Vascular;  Laterality: Left;  . BASCILIC VEIN TRANSPOSITION Left 07/24/2018   Procedure: SECOND STAGE BASCILIC VEIN TRANSPOSITION LEFT ARM;  Surgeon: Serafina Mitchell, MD;  Location: [facility];  Service: Vascular;  Laterality: Left;  . CARDIAC CATHETERIZATION  2012  . CARDIAC CATHETERIZATION  2013  . CARDIAC CATHETERIZATION  2014   LHC (1/14): total occlusion of distal LAD, diffuse disease in ramus, 70% proximal LCx, 50% proximal and mid RCA, 50% distal RCA, patent RIMA-RCA.  Marland Kitchen CORONARY ANGIOPLASTY WITH STENT PLACEMENT  1999 / 2002 / 2004 /  2006?  . CORONARY ARTERY BYPASS GRAFT  2006   CABG X?; in [place]  . CYSTECTOMY  1990's   off face  . HERNIA REPAIR     inguinal hernia  . LEFT HEART CATHETERIZATION WITH CORONARY/GRAFT ANGIOGRAM N/A 09/16/2012   Procedure: LEFT HEART CATHETERIZATION WITH [name];  Surgeon: [name], MD;  Location: [facility] CATH LAB;  Service: Cardiovascular;  Laterality: N/A;  . LEFT HEART CATHETERIZATION WITH CORONARY/GRAFT ANGIOGRAM N/A 12/02/2013   Procedure: LEFT HEART CATHETERIZATION WITH [name];  Surgeon: [name], MD;  Location: [facility] CATH LAB;  Service: Cardiovascular;  Laterality: N/A;  . PERCUTANEOUS CORONARY STENT INTERVENTION (PCI-S) N/A 12/03/2013   Procedure: PERCUTANEOUS CORONARY STENT INTERVENTION (PCI-S);  Surgeon: [name], MD;  Location: [facility] CATH LAB;  Service: Cardiovascular;  Laterality: N/A;  . RIGHT/LEFT HEART CATH AND CORONARY/GRAFT ANGIOGRAPHY N/A 06/23/2018   Procedure: RIGHT/LEFT HEART CATH AND CORONARY/GRAFT ANGIOGRAPHY;  Surgeon: [name], MD;  Location: [place] [facility] CV LAB;  Service: Cardiovascular;  Laterality: N/A;     Inpatient Medications: Scheduled Meds: . sodium chloride flush  3 mL Intravenous Once   Continuous Infusions:  PRN Meds:   Allergies:    Allergies  Allergen Reactions  . Cleocin [Clindamycin Hcl] Anaphylaxis and Swelling  . Glimepiride [Amaryl] Other (See Comments)    Elevates liver function   . Clarithromycin Itching and Other (See Comments)    "Biaxin" Eyes itch and burn  . Colchicine Other (See Comments)    Affected kidneys   . Ciprocin-Fluocin-Procin [Fluocinolone Acetonide] Rash    Social History:   Social History   Socioeconomic History  . Marital status: Married    Spouse name: [name]  . Number of children: 5  . Years of education: Not on file  . Highest education level: Not on file  Occupational History  . Occupation: retired    Comment: Chartered loss adjuster  Social Needs   . Financial resource strain: Not on file  . Food insecurity    Worry: Not on file    Inability: Not on file  . Transportation needs    Medical: Not on file    Non-medical: Not on file  Tobacco Use  . Smoking status: Former Smoker    Types: Cigarettes  . Smokeless tobacco: Never Used  . Tobacco comment: smoked some as a teenager (high school) not even a pack a week  Substance and Sexual Activity  . Alcohol use: No    Alcohol/week: 0.0 standard drinks    Comment: 09/15/2012 "drank a little when I was young"  . Drug use: No  . Sexual activity: Yes  Lifestyle  . Physical activity    Days per week: Not on file    Minutes per session: Not on file  . Stress: Not on file  Relationships  . Social Herbalist on  phone: Not on file    Gets together: Not on file    Attends religious service: Not on file    Active member of club or organization: Not on file    Attends meetings of clubs or organizations: Not on file    Relationship status: Not on file  . Intimate partner violence    Fear of current or ex partner: Not on file    Emotionally abused: Not on file    Physically abused: Not on file    Forced sexual activity: Not on file  Other Topics Concern  . Not on file  Social History Narrative   Married and lives with wife in [place]. On disability.    Consumes about 2 Coke Zeros a day     Family History:   Family History  Problem Relation Age of Onset  . Lung cancer Mother   . Anemia Mother   . Polycystic kidney disease Mother   . Diabetes Father   . Heart attack Father 35       Died suddenly  . Heart failure Father   . Hyperlipidemia Father   . Hypertension Father   . Kidney failure Sister 5       Died  . Heart attack Sister   . Hypertension Sister   . Diabetes Sister   . Diabetes Brother   . Polycystic kidney disease Brother   . Diabetes Sister      ROS:  Please see the history of present illness.  All other ROS reviewed and negative.     Physical  Exam/Data:   Vitals:   07/10/19 0950 07/10/19 1020 07/10/19 1027 07/10/19 1150  BP: 113/65     Pulse:  81 81   Resp: (!) 31 19 17    SpO2:  97% 98%   Weight:    101.6 kg  Height:    5\' 6"  (1.676 m)   No intake or output data in the 24 hours ending 07/10/19 1358 Last 3 Weights 07/10/2019 08/28/2018 08/25/2018  Weight (lbs) 224 lb 224 lb 3.2 oz 222 lb 3.2 oz  Weight (kg) 101.606 kg 101.696 kg 100.789 kg     Body mass index is 36.15 kg/m.  General:  Well nourished, well developed, in no acute distress HEENT: normal Lymph: no adenopathy Neck: JVD difficult to assess due to beard Endocrine:  No thryomegaly Vascular: No carotid bruits; FA pulses 2+ bilaterally without bruits  Cardiac:  normal S1, S2; RRR; 3/6 systolic murmur Lungs:  clear to auscultation bilaterally, no wheezing, rhonchi or rales  Abd: soft, nontender, no hepatomegaly  Ext: trace BL LE  edema Musculoskeletal:  No deformities, BUE and BLE strength normal and equal Skin: warm and dry  Neuro:  CNs 2-12 intact, no focal abnormalities noted Psych:  Normal affect   EKG:  The EKG was personally reviewed and demonstrates:  SR at rate of 88 bpm  Relevant CV Studies:  RIGHT/LEFT HEART CATH AND CORONARY/GRAFT ANGIOGRAPHY 06/2018  Conclusion    Mid LM to Dist LM lesion is 40% stenosed.  Ost LAD lesion is 70% stenosed.  Prox LAD lesion is 80% stenosed.  Dist LAD lesion is 100% stenosed.  Mid RCA to Dist RCA lesion is 10% stenosed.  Dist RCA lesion is 50% stenosed.  Mid LAD lesion is 50% stenosed.  Prox LAD to Mid LAD lesion is 50% stenosed.  PA pressure 30/16 mm Hg; mean PA 22 mm Hg; PCWP 12/12 PCWP 12 mm Hg; Ao sat 92%, PA sat 66 %;  CO 8.1 L/min; CI 3.8   Severe LAD disease.  It appears that the ostial LAD disease has worsened since the last cath and the mid LAD has also significantly worsened.  THe mid LAd may be the culprit for his presentation.    Will discuss with colleagues, plan for PCI.  Distal  left main trifurcation with significant ostial LAD disease.  Restart heparin 8 hours post sheath pull.  Watch renal function.  Post procedure hydration.  Will have to watch for fluid overload.     Diagnostic Dominance: Right   Echo 06/2018 - Left ventricle: The cavity size was normal. The estimated   ejection fraction was in the range of 55% to 60%. Hypokinesis of   the apical inferior, mid-apical inferoseptal, and mid-apical   anteroseptal myocardium. Apical akinesis.  Impressions:  - Limited echo for wall motion and systolic function.  Laboratory Data:  High Sensitivity Troponin:   Recent Labs  Lab 07/10/19 0918 07/10/19 1118  TROPONINIHS 26* 130*     Chemistry Recent Labs  Lab 07/10/19 0918  NA 134*  K 3.7  CL 100  CO2 19*  GLUCOSE 215*  BUN 67*  CREATININE 6.26*  CALCIUM 8.9  GFRNONAA 9*  GFRAA 10*  ANIONGAP 15    Hematology Recent Labs  Lab 07/10/19 0918  WBC 7.6  RBC 2.88*  HGB 8.1*  HCT 25.5*  MCV 88.5  MCH 28.1  MCHC 31.8  RDW 15.5  PLT 189    Radiology/Studies:  Dg Chest 2 View  Result Date: 07/10/2019 CLINICAL DATA:  Chest pain. Swelling in the legs and feet. EXAM: CHEST - 2 VIEW COMPARISON:  06/22/2018 FINDINGS: Stable enlarged cardiac silhouette and post CABG changes. Anterior epicardial pacemaker lead. Coronary artery stents. Mild diffuse peribronchial thickening and accentuation of the interstitial markings with improvement. Normal vasculature. No definite pleural fluid. Unremarkable bones. IMPRESSION: 1. Stable cardiomegaly. 2. Improved changes of congestive heart failure superimposed on mild chronic interstitial lung disease. Electronically Signed   By: Claudie Revering M.D.   On: 07/10/2019 09:49   Assessment and Plan:   1. Chest pain with elevated troponin consistent with non-STEMI in setting of end-stage renal disease -Patient has a chronic angina requiring sublingual nitroglycerin once every week.  Last night he had a  recurrent anginal pain which did not improve after 4-5 sublingual nitroglycerin/spray.  Still having 4 out of 5 chest pain.  Start heparin and IV nitro. -Continue home aspirin, Plavix, beta-blocker and Ranexa. -Patient will need repeat angiography however renal function is a major issue.   -Plan per MD  2.  End-stage renal disease - Followed by Dr. Justin Mend. Baseline Scr less than 5. Scr 6.26 yesterday -Patient has fistula placed December 2019 -He reports still making urine -Likely need nephrology evaluation for further guidance regarding enterography and stabilization of renal function  3. Chronic diastolic CHF - mild volume over load - Resume home lasix  4. HLD - No results found for requested labs within last 8760 hours.  - check lipid panel - Continue high intensity statin  Will follow. Dr. Debara Pickett to see later today.   For questions or updates, please contact [place] Please consult [email or web address] for contact info under     Jarrett Soho, PA  07/10/2019 1:58 PM

## 2019-07-10 NOTE — ED Notes (Signed)
Dinner Tray Ordered @ 1733. 

## 2019-07-10 NOTE — ED Notes (Signed)
Family at bedside. 

## 2019-07-10 NOTE — H&P (Signed)
History and Physical    Matthew Rowe [id number] DOB: 09/04/1961 DOA: 07/10/2019  Referring MD/NP/PA: Lennice Sites, MD PCP: [name], MD  Patient coming from: Home via EMS  Chief Complaint: Chest pain  I have personally briefly reviewed patient's old medical records in [place]   HPI: Matthew Rowe. is a 58 y.o. male with medical history significant of combined systolic and diastolic CHF, CAD s/p CABG, chronic anemia, CKD stage IV, diabetes mellitus type 2, OSA on CPAP.  He presents with complaints of chest pain started around 12 AM this morning.  He describes it as a pressure across his whole chest.  He tried taking sublingual nitroglycerin with only slight improvement in symptoms.  He reports that his legs have been swelling.  Few weeks ago his PCP changed his Lasix from 80 mg daily to 40 mg twice daily.  Nonproductive cough.  Denies any recent fever, chills, nausea, vomiting, diaphoresis, or loss of consciousness.  Patient reports that he is still makes urine and has a fistula, but has not started on hemodialysis at this time.  He is followed by Dr. Justin Rowe nephrology in outpatient setting.  ED Course: Upon admission to the emergency department patient was seen to be afebrile and tachypneic with O2 saturations maintained on 2 to 3 L nasal cannula oxygen.  Labs significant for WBC 7.8, hemoglobin 8.1, sodium 134, BUN 67, creatinine 6.26, glucose 215, BNP 1468.8, and high-sensitivity troponin 26->130.  Cardiology was consulted.  Patient was started on heparin drip.  TRH called to admit.   Review of Systems  Constitutional: Positive for malaise/fatigue. Negative for chills, diaphoresis and fever.  HENT: Negative for congestion and sinus pain.   Eyes: Negative for photophobia and pain.  Respiratory: Positive for cough and shortness of breath.   Cardiovascular: Positive for chest pain and leg swelling.  Gastrointestinal: Negative for abdominal pain, nausea and  vomiting.  Genitourinary: Negative for dysuria and hematuria.  Musculoskeletal: Negative for falls and joint pain.  Skin: Negative for itching and rash.  Neurological: Negative for focal weakness and loss of consciousness.  Psychiatric/Behavioral: Negative for substance abuse. The patient has insomnia.      Past Medical History:  Diagnosis Date  . Acute on chronic combined systolic and diastolic CHF (congestive heart failure) ([place]) 10/15/2017  . Chronic anemia   . Chronic combined systolic and diastolic CHF (congestive heart failure) ([place])   . Chronic stable angina ([place])   . CKD (chronic kidney disease), stage IV ([place])   . Coronary artery disease    a. INF MI 1999: PCI of RCA;  b.  extensive stenting of LAD;  c. s/p CABG with RIMA->RCA in 2006;  d. Stable, Low risk MV 05/2012; e. 09/2012 Cath: stable anatomy->med Rx. f. Abnl nuc 11/2013 -> med rx; g. 11/2013 Cath/PCI: s/p DES to RCA. h. Abnormal nuc 11/2016, pt declined cath.   . DM2 (diabetes mellitus, type 2) (HCC)    Type II  . Dyslipidemia   . GERD (gastroesophageal reflux disease)   . History of gout   . Hypertension   . Hypertensive heart disease   . Ischemic cardiomyopathy   . Mild aortic stenosis   . Myocardial infarction [facility]) 1999; 2002; 2003; 2006; ~ 2008  . OSA on CPAP    wears CPAP  . Polycystic kidney disease     Past Surgical History:  Procedure Laterality Date  . AV FISTULA PLACEMENT Left 05/29/2018   Procedure: ARTERIOVENOUS (AV) FISTULA CREATION  LEFT ARM.;  Surgeon: Serafina Mitchell, MD;  Location: [facility] OR;  Service: Vascular;  Laterality: Left;  . BASCILIC VEIN TRANSPOSITION Left 07/24/2018   Procedure: SECOND STAGE BASCILIC VEIN TRANSPOSITION LEFT ARM;  Surgeon: Serafina Mitchell, MD;  Location: [facility];  Service: Vascular;  Laterality: Left;  . CARDIAC CATHETERIZATION  2012  . CARDIAC CATHETERIZATION  2013  . CARDIAC CATHETERIZATION  2014   LHC (1/14): total occlusion of distal LAD, diffuse disease in ramus, 70%  proximal LCx, 50% proximal and mid RCA, 50% distal RCA, patent RIMA-RCA.  Marland Kitchen CORONARY ANGIOPLASTY WITH STENT PLACEMENT  1999 / 2002 / 2004 / 2006?  . CORONARY ARTERY BYPASS GRAFT  2006   CABG X?; in [place]  . CYSTECTOMY  1990's   off face  . HERNIA REPAIR     inguinal hernia  . LEFT HEART CATHETERIZATION WITH CORONARY/GRAFT ANGIOGRAM N/A 09/16/2012   Procedure: LEFT HEART CATHETERIZATION WITH [name];  Surgeon: [name], MD;  Location[facility] CATH LAB;  Service: Cardiovascular;  Laterality: N/A;  . LEFT HEART CATHETERIZATION WITH CORONARY/GRAFT ANGIOGRAM N/A 12/02/2013   Procedure: LEFT HEART CATHETERIZATION WITH [name];  Surgeon: [name], MD;  Location: [facility] CATH LAB;  Service: Cardiovascular;  Laterality: N/A;  . PERCUTANEOUS CORONARY STENT INTERVENTION (PCI-S) N/A 12/03/2013   Procedure: PERCUTANEOUS CORONARY STENT INTERVENTION (PCI-S);  Surgeon: [name], MD;  Location: [facility] CATH LAB;  Service: Cardiovascular;  Laterality: N/A;  . RIGHT/LEFT HEART CATH AND CORONARY/GRAFT ANGIOGRAPHY N/A 06/23/2018   Procedure: RIGHT/LEFT HEART CATH AND CORONARY/GRAFT ANGIOGRAPHY;  Surgeon: [name], MD;  Location: [facility] CV LAB;  Service: Cardiovascular;  Laterality: N/A;     reports that he has quit smoking. His smoking use included cigarettes. He has never used smokeless tobacco. He reports that he does not drink alcohol or use drugs.  Allergies  Allergen Reactions  . Cleocin [Clindamycin Hcl] Anaphylaxis and Swelling  . Glimepiride [Amaryl] Other (See Comments)    Elevates liver function   . Clarithromycin Itching and Other (See Comments)    "Biaxin" Eyes itch and burn  . Colchicine Other (See Comments)    Affected kidneys   . Ciprocin-Fluocin-Procin [Fluocinolone Acetonide] Rash    Family History  Problem Relation Age of Onset  . Lung cancer Mother   . Anemia Mother   . Polycystic kidney disease Mother   . Diabetes  Father   . Heart attack Father 11       Died suddenly  . Heart failure Father   . Hyperlipidemia Father   . Hypertension Father   . Kidney failure Sister 5       Died  . Heart attack Sister   . Hypertension Sister   . Diabetes Sister   . Diabetes Brother   . Polycystic kidney disease Brother   . Diabetes Sister     Prior to Admission medications   Medication Sig Start Date End Date Taking? Authorizing Provider  acetaminophen (TYLENOL) 500 MG tablet Take 500 mg by mouth 2 (two) times daily as needed (for pain or headaches).    Yes [provider]  allopurinol (ZYLOPRIM) 100 MG tablet Take 100 mg by mouth 2 (two) times daily.    Yes [provider]  amLODipine (NORVASC) 10 MG tablet Take 1 tablet (10 mg total) by mouth daily. 01/12/15  Yes Copland, Gay Filler, MD  aspirin EC 81 MG EC tablet Take 1 tablet (81 mg total) by mouth daily. 10/19/17  Yes Kilroy,  Doreene Burke, PA-C  atorvastatin (LIPITOR) 80 MG tablet Take 1 tablet (80 mg total) by mouth daily. Patient taking differently: Take 80 mg by mouth at bedtime.  08/26/17  Yes Dunn, Dayna N, PA-C  carvedilol (COREG) 25 MG tablet Take 2 tablets (50 mg total) by mouth 2 (two) times daily. 04/01/19  Yes [name], MD  Cholecalciferol 2000 units TABS Take 2,000 Units by mouth daily.    Yes [provider]  clopidogrel (PLAVIX) 75 MG tablet TAKE ONE TABLET BY MOUTH ONCE DAILY WITH BREAKFAST Patient taking differently: Take 75 mg by mouth daily.  12/25/16  Yes End, Harrell Gave, MD  diphenhydrAMINE (BENADRYL) 25 MG tablet Take 25 mg by mouth every 6 (six) hours as needed for allergies or sleep.    Yes [provider]  furosemide (LASIX) 80 MG tablet Take 1 tablet (80 mg total) by mouth 2 (two) times daily. 08/05/18  Yes End, Harrell Gave, MD  gabapentin (NEURONTIN) 300 MG capsule Take 1 capsule (300 mg total) by mouth 2 (two) times daily. Patient taking differently: Take 300 mg by mouth daily.  04/03/17  Yes  Eulogio Bear U, DO  hydrALAZINE (APRESOLINE) 50 MG tablet Take 1 tablet (50 mg total) by mouth 3 (three) times daily. 12/20/16  Yes End, Harrell Gave, MD  insulin aspart protamine- aspart (NOVOLOG MIX 70/30) (70-30) 100 UNIT/ML injection Inject 20 Units into the skin 2 (two) times daily as needed (if blood sugar is over 180).   Yes [provider]  isosorbide mononitrate (IMDUR) 120 MG 24 hr tablet Take 1 tablet (120 mg total) by mouth 2 (two) times daily. 08/26/17  Yes Dunn, Dayna N, PA-C  Multiple Vitamin (MULTIVITAMIN WITH MINERALS) TABS tablet Take 1 tablet by mouth daily.   Yes [provider]  nitroGLYCERIN (NITROLINGUAL) 0.4 MG/SPRAY spray Place 1 spray under the tongue every 5 (five) minutes x 3 doses as needed for chest pain.   Yes [provider]  nitroGLYCERIN (NITROSTAT) 0.4 MG SL tablet Place 1 tablet (0.4 mg total) under the tongue every 5 (five) minutes as needed for chest pain. 06/06/18  Yes End, Harrell Gave, MD  pantoprazole (PROTONIX) 40 MG tablet Take 40 mg by mouth daily.    Yes [provider]  ranitidine (ZANTAC) 300 MG tablet Take 300 mg by mouth at bedtime.   Yes [provider]  ranolazine (RANEXA) 500 MG 12 hr tablet Take 1 tablet (500 mg total) by mouth 2 (two) times daily. 03/29/17  Yes End, Harrell Gave, MD  Semaglutide (OZEMPIC, 0.25 OR 0.5 MG/DOSE, [place]) Inject 0.25 mg into the skin every Saturday.   Yes [provider]  traMADol (ULTRAM) 50 MG tablet Take 1 tablet (50 mg total) by mouth every 12 (twelve) hours as needed for moderate pain. 06/26/18  Yes Patrecia Pour, MD  vitamin E 400 UNIT capsule Take 400 Units by mouth daily.   Yes [provider]  zolpidem (AMBIEN) 10 MG tablet Take 10 mg by mouth at bedtime as needed for sleep.  09/30/17  Yes [provider]  [place]. Devices MISC by Does not apply route. C-PAP    [provider]    Physical Exam:  Constitutional: Obese male who appears to be  in no acute distress and target Vitals:   07/10/19 0950 07/10/19 1020 07/10/19 1027 07/10/19 1150  BP: 113/65     Pulse:  81 81   Resp: (!) 31 19 17    SpO2:  97% 98%   Weight:  101.6 kg  Height:    5\' 6"  (1.676 m)   Eyes: PERRL, lids and conjunctivae normal ENMT: Mucous membranes are dry. Posterior pharynx clear of any exudate or lesions.  Neck: normal, supple, no masses, no thyromegaly.  Positive JVD appreciated Respiratory: Positive crackles heard in the mid to lower lung fields.  Patient currently on 3 L of nasal cannula oxygen to maintain O2 saturations. Cardiovascular: Regular rate and rhythm, no murmurs / rubs / gallops.  At least 2+ pitting bilateral extremity edema. 2+ pedal pulses. No carotid bruits.  Left upper extremity fistula in place.   Abdomen: no tenderness, no masses palpated. No hepatosplenomegaly. Bowel sounds positive.  Musculoskeletal: no clubbing / cyanosis. No joint deformity upper and lower extremities. Good ROM, no contractures. Normal muscle tone.  Skin: no rashes, lesions, ulcers. No induration Neurologic: CN 2-12 grossly intact. Sensation intact, DTR normal. Strength 5/5 in all 4.  Psychiatric: Normal judgment and insight. Alert and oriented x 3. Normal mood.     Labs on Admission: I have personally reviewed following labs and imaging studies  CBC: Recent Labs  Lab 07/10/19 0918  WBC 7.6  HGB 8.1*  HCT 25.5*  MCV 88.5  PLT 99991111   Basic Metabolic Panel: Recent Labs  Lab 07/10/19 0918  NA 134*  K 3.7  CL 100  CO2 19*  GLUCOSE 215*  BUN 67*  CREATININE 6.26*  CALCIUM 8.9   GFR: Estimated Creatinine Clearance: 14.2 mL/min (A) (by C-G formula based on SCr of 6.26 mg/dL (H)). Liver Function Tests: No results for input(s): AST, ALT, ALKPHOS, BILITOT, PROT, ALBUMIN in the last 168 hours. No results for input(s): LIPASE, AMYLASE in the last 168 hours. No results for input(s): AMMONIA in the last 168 hours. Coagulation Profile: No results for  input(s): INR, PROTIME in the last 168 hours. Cardiac Enzymes: No results for input(s): CKTOTAL, CKMB, CKMBINDEX, TROPONINI in the last 168 hours. BNP (last 3 results) No results for input(s): PROBNP in the last 8760 hours. HbA1C: No results for input(s): HGBA1C in the last 72 hours. CBG: No results for input(s): GLUCAP in the last 168 hours. Lipid Profile: No results for input(s): CHOL, HDL, LDLCALC, TRIG, CHOLHDL, LDLDIRECT in the last 72 hours. Thyroid Function Tests: No results for input(s): TSH, T4TOTAL, FREET4, T3FREE, THYROIDAB in the last 72 hours. Anemia Panel: No results for input(s): VITAMINB12, FOLATE, FERRITIN, TIBC, IRON, RETICCTPCT in the last 72 hours. Urine analysis:    Component Value Date/Time   COLORURINE STRAW (A) 06/20/2018 2033   APPEARANCEUR CLEAR 06/20/2018 2033   LABSPEC 1.006 06/20/2018 2033   PHURINE 6.0 06/20/2018 2033   GLUCOSEU NEGATIVE 06/20/2018 2033   HGBUR NEGATIVE 06/20/2018 2033   BILIRUBINUR NEGATIVE 06/20/2018 2033   [place] NEGATIVE 06/20/2018 2033   PROTEINUR NEGATIVE 06/20/2018 2033   UROBILINOGEN 1.0 07/17/2015 1425   NITRITE NEGATIVE 06/20/2018 2033   LEUKOCYTESUR NEGATIVE 06/20/2018 2033   Sepsis Labs: Recent Results (from the past 240 hour(s))  SARS Coronavirus 2 by RT PCR (hospital order, performed in [facility] hospital lab) Nasopharyngeal Nasopharyngeal Swab     Status: None   Collection Time: 07/10/19  1:30 PM   Specimen: Nasopharyngeal Swab  Result Value Ref Range Status   SARS Coronavirus 2 NEGATIVE NEGATIVE Final    Comment: (NOTE) If result is NEGATIVE SARS-CoV-2 target nucleic acids are NOT DETECTED. The SARS-CoV-2 RNA is generally detectable in upper and lower  respiratory specimens during the acute phase of infection. The lowest  concentration of  SARS-CoV-2 viral copies this assay can detect is 250  copies / mL. A negative result does not preclude SARS-CoV-2 infection  and should not be used as the sole basis for  treatment or other  patient management decisions.  A negative result may occur with  improper specimen collection / handling, submission of specimen other  than nasopharyngeal swab, presence of viral mutation(s) within the  areas targeted by this assay, and inadequate number of viral copies  (<250 copies / mL). A negative result must be combined with clinical  observations, patient history, and epidemiological information. If result is POSITIVE SARS-CoV-2 target nucleic acids are DETECTED. The SARS-CoV-2 RNA is generally detectable in upper and lower  respiratory specimens dur ing the acute phase of infection.  Positive  results are indicative of active infection with SARS-CoV-2.  Clinical  correlation with patient history and other diagnostic information is  necessary to determine patient infection status.  Positive results do  not rule out bacterial infection or co-infection with other viruses. If result is PRESUMPTIVE POSTIVE SARS-CoV-2 nucleic acids MAY BE PRESENT.   A presumptive positive result was obtained on the submitted specimen  and confirmed on repeat testing.  While 2019 novel coronavirus  (SARS-CoV-2) nucleic acids may be present in the submitted sample  additional confirmatory testing may be necessary for epidemiological  and / or clinical management purposes  to differentiate between  SARS-CoV-2 and other Sarbecovirus currently known to infect humans.  If clinically indicated additional testing with an alternate test  methodology [phone]) is advised. The SARS-CoV-2 RNA is generally  detectable in upper and lower respiratory sp ecimens during the acute  phase of infection. The expected result is Negative. Fact Sheet for Patients:  StrictlyIdeas.no Fact Sheet for Healthcare Providers: BankingDealers.co.za This test is not yet approved or cleared by the [place] FDA and has been authorized for detection and/or  diagnosis of SARS-CoV-2 by FDA under an Emergency Use Authorization (EUA).  This EUA will remain in effect (meaning this test can be used) for the duration of the COVID-19 declaration under Section 564(b)(1) of the Act, 21 U.S.C. section 360bbb-3(b)(1), unless the authorization is terminated or revoked sooner. Performed at [facility], [place] [address], [place], [place] [address]      Radiological Exams on Admission: Dg Chest 2 View  Result Date: 07/10/2019 CLINICAL DATA:  Chest pain. Swelling in the legs and feet. EXAM: CHEST - 2 VIEW COMPARISON:  06/22/2018 FINDINGS: Stable enlarged cardiac silhouette and post CABG changes. Anterior epicardial pacemaker lead. Coronary artery stents. Mild diffuse peribronchial thickening and accentuation of the interstitial markings with improvement. Normal vasculature. No definite pleural fluid. Unremarkable bones. IMPRESSION: 1. Stable cardiomegaly. 2. Improved changes of congestive heart failure superimposed on mild chronic interstitial lung disease. Electronically Signed   By: Claudie Revering M.D.   On: 07/10/2019 09:49    EKG: Independently reviewed.  Normal sinus rhythm 88 bpm with nonspecific and drug ventricular conduction block.  Assessment/Plan NSTEMI: Acute.  Patient presents with complaints of chest pain across his chest.  High-sensitivity troponins trending up from 26-> 130.  Patient was started on a heparin drip.  Suspect patient needs cardiac cath at some point but would likely lead to dialysis. -Admit to a progressive bed -Continue IV heparin per pharmacy -Check echocardiogram -Continue aspirin, Plavix, beta-blocker, and [place] cardiology consultative services, we will follow for further recommendation  ESRD: Patient is not on hemodialysis at this time, but has fistula in place.  Creatinine currently  6.26 with BUN 67.  - Dr. Justin Rowe who is actually patient outpatient nephrologist was consulted, we will follow-up for further  recommendation  Diastolic CHF exacerbation: Acute on chronic.  Patient presents with worsening lower extremity edema.  2+ pitting edema noted on physical exam.  BNP elevated at 1468.8 which is never been that elevated previously before.Last EF noted to be 55 to 60% with apical hypokinesis.  Chest x-ray noting improved aeration from previous chest x-ray.   -Strict intake and output and daily weights -Lasix 40 mg given x1 dose. -Reassess in a.m. and determine if patient had any improvement in overall kidney function and/or breathing. -Otherwise we will need to resume home regimen  Anemia of chronic kidney disease: Patient's baseline hemoglobin previously noted to be between 9 and 10. Denied any reports of bleeding. -Recheck CBC in a.m.  Diabetes mellitus type 2: At home patient on NovoLog 70/30 mix 20 units twice daily and semaglutide. -Hypoglycemic protocols -CBGs before every meal  -Continue home regimen  CAD: Patient with previous history of coronary artery disease requiring CABG.Last cardiac cath in 06/2018. -Continue aspirin, Plavix, and statin  Essential hypertension -Continue Coreg, hydralazine, and amlodipine  Hyperlipidemia -Follow-up lipid panel -Continue atorvastatin  OSA on CPAP -RT to continue CPAP  GERD -Continue Protonix  DVT prophylaxis: heparin gtt   Code Status: Full Family Communication: Discussed plan of care with the patient and family present at bedside Disposition Plan: Possible discharge home in 2 to 3 days Consults called: Cardiology, nephrology Admission status: Inpatient   Norval [name] MD [facility] Pager [phone]   If 7PM-7AM, please contact night-coverage [email or web address] Password [email or web address]  07/10/2019, 4:22 PM

## 2019-07-10 NOTE — Progress Notes (Signed)
ANTICOAGULATION CONSULT NOTE - Initial Consult  Pharmacy Consult for IV Heparin Indication: chest pain/ACS  Allergies  Allergen Reactions  . Cleocin [Clindamycin Hcl] Anaphylaxis and Swelling  . Glimepiride [Amaryl] Other (See Comments)    Elevates liver function   . Clarithromycin Itching and Other (See Comments)    "Biaxin" Eyes itch and burn  . Colchicine Other (See Comments)    Affected kidneys   . Ciprocin-Fluocin-Procin [Fluocinolone Acetonide] Rash    Patient Measurements: Height: 5\' 6"  (167.6 cm) Weight: 224 lb (101.6 kg) IBW/kg (Calculated) : 63.8 Heparin Dosing Weight: 86.3 kg  Vital Signs: BP: 113/65 (10/30 0950) Pulse Rate: 81 (10/30 1027)  Labs: Recent Labs    07/10/19 0918 07/10/19 1118  HGB 8.1*  --   HCT 25.5*  --   PLT 189  --   CREATININE 6.26*  --   TROPONINIHS 26* 130*    Estimated Creatinine Clearance: 14.2 mL/min (A) (by C-G formula based on SCr of 6.26 mg/dL (H)).   Medical History: Past Medical History:  Diagnosis Date  . Acute on chronic combined systolic and diastolic CHF (congestive heart failure) ([place]) 10/15/2017  . Chronic anemia   . Chronic combined systolic and diastolic CHF (congestive heart failure) ([place])   . Chronic stable angina ([place])   . CKD (chronic kidney disease), stage IV ([place])   . Coronary artery disease    a. INF MI 1999: PCI of RCA;  b.  extensive stenting of LAD;  c. s/p CABG with RIMA->RCA in 2006;  d. Stable, Low risk MV 05/2012; e. 09/2012 Cath: stable anatomy->med Rx. f. Abnl nuc 11/2013 -> med rx; g. 11/2013 Cath/PCI: s/p DES to RCA. h. Abnormal nuc 11/2016, pt declined cath.   . DM2 (diabetes mellitus, type 2) (HCC)    Type II  . Dyslipidemia   . GERD (gastroesophageal reflux disease)   . History of gout   . Hypertension   . Hypertensive heart disease   . Ischemic cardiomyopathy   . Mild aortic stenosis   . Myocardial infarction [facility]) 1999; 2002; 2003; 2006; ~ 2008  . OSA on CPAP    wears CPAP  . Polycystic  kidney disease     Medications:  No anticoagulation prior to admission  Assessment: 58 year old male who presented to the ED for chest pain. Pharmacy consulted for IV Heparin dosing for ACS.   Hgb low at 8.1. Platelets 189. No bleeding noted.  High sensitivity troponin up from 26 to 130.  Patient has stage IV CKD and SCr is up at 6.26.   Goal of Therapy:  Heparin level 0.3-0.7 units/ml Monitor platelets by anticoagulation protocol: Yes   Plan:  Give 4000 units bolus x 1 Start heparin infusion at 1100 units/hr Check anti-Xa level in 8 hours and daily while on heparin Continue to monitor H&H and platelets  Sloan Leiter, PharmD, BCPS, BCCCP Clinical Pharmacist Clinical phone 07/10/2019 until 3:[id number] Please refer to AMION for [place] numbers 07/10/2019,3:02 PM

## 2019-07-11 ENCOUNTER — Other Ambulatory Visit (HOSPITAL_COMMUNITY): Payer: Medicare HMO

## 2019-07-11 DIAGNOSIS — N185 Chronic kidney disease, stage 5: Secondary | ICD-10-CM | POA: Diagnosis not present

## 2019-07-11 DIAGNOSIS — Q613 Polycystic kidney, unspecified: Secondary | ICD-10-CM

## 2019-07-11 DIAGNOSIS — Z9861 Coronary angioplasty status: Secondary | ICD-10-CM | POA: Diagnosis not present

## 2019-07-11 DIAGNOSIS — I2510 Atherosclerotic heart disease of native coronary artery without angina pectoris: Secondary | ICD-10-CM | POA: Diagnosis not present

## 2019-07-11 LAB — CBC WITH DIFFERENTIAL/PLATELET
Abs Immature Granulocytes: 0.04 K/uL (ref 0.00–0.07)
Basophils Absolute: 0 K/uL (ref 0.0–0.1)
Basophils Relative: 0 %
Eosinophils Absolute: 0.1 K/uL (ref 0.0–0.5)
Eosinophils Relative: 1 %
HCT: 26.7 % — ABNORMAL LOW (ref 39.0–52.0)
Hemoglobin: 8.5 g/dL — ABNORMAL LOW (ref 13.0–17.0)
Immature Granulocytes: 1 %
Lymphocytes Relative: 11 %
Lymphs Abs: 1 K/uL (ref 0.7–4.0)
MCH: 28.9 pg (ref 26.0–34.0)
MCHC: 31.8 g/dL (ref 30.0–36.0)
MCV: 90.8 fL (ref 80.0–100.0)
Monocytes Absolute: 0.7 K/uL (ref 0.1–1.0)
Monocytes Relative: 8 %
Neutro Abs: 6.9 K/uL (ref 1.7–7.7)
Neutrophils Relative %: 79 %
Platelets: 185 K/uL (ref 150–400)
RBC: 2.94 MIL/uL — ABNORMAL LOW (ref 4.22–5.81)
RDW: 15.9 % — ABNORMAL HIGH (ref 11.5–15.5)
WBC: 8.8 K/uL (ref 4.0–10.5)
nRBC: 0 % (ref 0.0–0.2)

## 2019-07-11 LAB — HEPATITIS B SURFACE ANTIGEN: Hepatitis B Surface Ag: NONREACTIVE

## 2019-07-11 LAB — IRON AND TIBC
Iron: 55 ug/dL (ref 45–182)
Saturation Ratios: 21 % (ref 17.9–39.5)
TIBC: 263 ug/dL (ref 250–450)
UIBC: 209 ug/dL

## 2019-07-11 LAB — BASIC METABOLIC PANEL
Anion gap: 14 (ref 5–15)
BUN: 69 mg/dL — ABNORMAL HIGH (ref 6–20)
CO2: 21 mmol/L — ABNORMAL LOW (ref 22–32)
Calcium: 8.7 mg/dL — ABNORMAL LOW (ref 8.9–10.3)
Chloride: 98 mmol/L (ref 98–111)
Creatinine, Ser: 6.74 mg/dL — ABNORMAL HIGH (ref 0.61–1.24)
GFR calc Af Amer: 9 mL/min — ABNORMAL LOW (ref >60)
GFR calc non Af Amer: 8 mL/min — ABNORMAL LOW (ref >60)
Glucose, Bld: 165 mg/dL — ABNORMAL HIGH (ref 70–99)
Potassium: 4.1 mmol/L (ref 3.5–5.1)
Sodium: 133 mmol/L — ABNORMAL LOW (ref 135–145)

## 2019-07-11 LAB — HEPATITIS B CORE ANTIBODY, TOTAL: Hep B Core Total Ab: NONREACTIVE

## 2019-07-11 LAB — HEPARIN LEVEL (UNFRACTIONATED)
Heparin Unfractionated: 0.51 IU/mL (ref 0.30–0.70)
Heparin Unfractionated: 0.41 [IU]/mL (ref 0.30–0.70)

## 2019-07-11 LAB — GLUCOSE, CAPILLARY
Glucose-Capillary: 71 mg/dL (ref 70–99)
Glucose-Capillary: 130 mg/dL — ABNORMAL HIGH (ref 70–99)
Glucose-Capillary: 127 mg/dL — ABNORMAL HIGH (ref 70–99)

## 2019-07-11 MED ORDER — SODIUM CHLORIDE 0.9 % IV SOLN: 100.0000 mL | INTRAVENOUS | Status: DC | PRN

## 2019-07-11 MED ORDER — DARBEPOETIN ALFA 200 MCG/0.4ML IJ SOSY
PREFILLED_SYRINGE | INTRAMUSCULAR | Status: AC
  Administered 2019-07-11: 200 ug via SUBCUTANEOUS
  Filled 2019-07-11: qty 0.4

## 2019-07-11 MED ORDER — HEPARIN SODIUM (PORCINE) 1000 UNIT/ML DIALYSIS: 1000.0000 [IU] | INTRAMUSCULAR | Status: DC | PRN

## 2019-07-11 MED ORDER — PENTAFLUOROPROP-TETRAFLUOROETH EX AERO: 1.0000 "application " | INHALATION_SPRAY | CUTANEOUS | Status: DC | PRN

## 2019-07-11 MED ORDER — LIDOCAINE-PRILOCAINE 2.5-2.5 % EX CREA: 1.0000 "application " | TOPICAL_CREAM | CUTANEOUS | Status: DC | PRN

## 2019-07-11 MED ORDER — ALTEPLASE 2 MG IJ SOLR: 2.0000 mg | Freq: Once | INTRAMUSCULAR | Status: DC | PRN

## 2019-07-11 MED ORDER — GUAIFENESIN-DM 100-10 MG/5ML PO SYRP
5.0000 mL | ORAL_SOLUTION | ORAL | Status: DC | PRN
  Administered 2019-07-11 – 2019-07-16 (×3): 5 mL via ORAL
  Filled 2019-07-11 (×3): qty 5

## 2019-07-11 MED ORDER — LIDOCAINE HCL (PF) 1 % IJ SOLN: 5.0000 mL | INTRAMUSCULAR | Status: DC | PRN

## 2019-07-11 MED ORDER — CHLORHEXIDINE GLUCONATE CLOTH 2 % EX PADS
6.0000 | MEDICATED_PAD | Freq: Every day | CUTANEOUS | Status: DC
  Administered 2019-07-12 – 2019-07-13 (×2): 6 via TOPICAL

## 2019-07-11 NOTE — Progress Notes (Signed)
Progress Note  Patient Name: Matthew MONIGOLD Sr. Date of Encounter: 07/11/2019  Primary Cardiologist: Larae Grooms, MD   Subjective   58 year old gentleman with a history of coronary artery disease-status post coronary artery bypass grafting.  He is status post multiple prior PCI's.  He has morbid obesity, hypertension, CKF stabe IV/V   but is not on hemodialysis.  His creatinine is currently 6.  He was admitted yesterday with episodes of chest pain.  His troponin elevations are elevated consistent with a non-ST segment elevation myocardial infarction.    Inpatient Medications    Scheduled Meds: . allopurinol  100 mg Oral BID  . amLODipine  10 mg Oral Daily  . aspirin  81 mg Oral Daily  . atorvastatin  80 mg Oral QHS  . carvedilol  50 mg Oral BID WC  . clopidogrel  75 mg Oral Daily  . darbepoetin (ARANESP) injection - NON-DIALYSIS  200 mcg Subcutaneous Q Sat-1800  . gabapentin  300 mg Oral Daily  . hydrALAZINE  50 mg Oral TID  . isosorbide mononitrate  120 mg Oral BID  . pantoprazole  40 mg Oral Daily  . ranolazine  500 mg Oral BID  . sodium chloride flush  3 mL Intravenous Once  . sodium chloride flush  3 mL Intravenous Q12H   Continuous Infusions: . sodium chloride    . heparin 1,100 Units/hr (07/10/19 1524)   PRN Meds: sodium chloride, acetaminophen, insulin aspart protamine- aspart, nitroGLYCERIN, ondansetron (ZOFRAN) IV, oxymetazoline, sodium chloride flush, traMADol, zolpidem   Vital Signs    Vitals:   07/11/19 0014 07/11/19 0453 07/11/19 0735 07/11/19 0808  BP: 102/63 106/74    Pulse: 69 69    Resp: 19 17  (!) 21  Temp:  (!) 97.3 F (36.3 C)  (!) 97.4 F (36.3 C)  TempSrc:  Oral  Oral  SpO2: 97% 94% 100%   Weight:  110.6 kg    Height:        Intake/Output Summary (Last 24 hours) at 07/11/2019 0912 Last data filed at 07/11/2019 0700 Gross per 24 hour  Intake 208.7 ml  Output 200 ml  Net 8.7 ml   Last 3 Weights 07/11/2019 07/10/2019  07/10/2019  Weight (lbs) 243 lb 14.4 oz 244 lb 3.2 oz 224 lb  Weight (kg) 110.632 kg 110.768 kg 101.606 kg      Telemetry     - Personally Reviewed  ECG     - Personally Reviewed  Physical Exam   GEN: middle age make,  Moderately obese,  On BIPAP  Neck: No JVD Cardiac: RRR, no murmurs, rubs, or gallops.  Respiratory: Clear to auscultation bilaterally. GI: Soft, nontender, non-distended  MS: 2+ pitting edema  Neuro:  Nonfocal  Psych: Normal affect   Labs    High Sensitivity Troponin:   Recent Labs  Lab 07/10/19 0918 07/10/19 1118  TROPONINIHS 26* 130*      Chemistry Recent Labs  Lab 07/10/19 0918 07/11/19 0040  NA 134* 133*  K 3.7 4.1  CL 100 98  CO2 19* 21*  GLUCOSE 215* 165*  BUN 67* 69*  CREATININE 6.26* 6.74*  CALCIUM 8.9 8.7*  GFRNONAA 9* 8*  GFRAA 10* 9*  ANIONGAP 15 14     Hematology Recent Labs  Lab 07/10/19 0918 07/11/19 0040  WBC 7.6 8.8  RBC 2.88* 2.94*  HGB 8.1* 8.5*  HCT 25.5* 26.7*  MCV 88.5 90.8  MCH 28.1 28.9  MCHC 31.8 31.8  RDW 15.5 15.9*  PLT 189 185    BNP Recent Labs  Lab 07/10/19 0918  BNP 1,468.8*     DDimer No results for input(s): DDIMER in the last 168 hours.   Radiology    Dg Chest 2 View  Result Date: 07/10/2019 CLINICAL DATA:  Chest pain. Swelling in the legs and feet. EXAM: CHEST - 2 VIEW COMPARISON:  06/22/2018 FINDINGS: Stable enlarged cardiac silhouette and post CABG changes. Anterior epicardial pacemaker lead. Coronary artery stents. Mild diffuse peribronchial thickening and accentuation of the interstitial markings with improvement. Normal vasculature. No definite pleural fluid. Unremarkable bones. IMPRESSION: 1. Stable cardiomegaly. 2. Improved changes of congestive heart failure superimposed on mild chronic interstitial lung disease. Electronically Signed   By: Claudie Revering M.D.   On: 07/10/2019 09:49    Cardiac Studies     Patient Profile     58 y.o. male  With CAD   Assessment & Plan    1.  Unstable angina/NSTEMI: The patient presents with symptoms consistent with unstable angina and non-ST segment elevation myocardial infarction.  He did not go for emergent cath because of a creatinine of greater than 6.  He was not on dialysis.  He has been seen by nephrology and there apparently are some plans to start dialysis soon-perhaps today.  The patient was hesitant to hear what I had to say about heart catheterization since I was not from [facility].  I explained that [facility] is now what the [facility] used to be.  He wanted that documented in writing.  He called a family member and we discussed the fact that we merged years ago and that we are all now one large group.  Hopefully this will satisfy his question.   He did not remember who has cardiologist was.   I reviewed in the chart  He was previously seen by Dr. Aundra Dubin and later by Dr. Saunders Revel. .  I explained to him that Dr. Aundra Dubin does not do general cardiology at this point but does only congestive heart failure.  Dr. Saunders Revel now works in [place] .   He will talk to the nephrologist today.  We anticipate doing a heart catheterization on Monday.         For questions or updates, please contact [place] Please consult [email or web address] for contact info under        Signed, Mertie Moores, MD  07/11/2019, 9:12 AM

## 2019-07-11 NOTE — Progress Notes (Addendum)
PROGRESS NOTE    Matthew Rowe   [id number]  DOB: 05-05-1961  DOA: 07/10/2019 PCP: [name], MD   Brief Narrative:  Matthew Rowe. is a 58 y.o. male with medical history significant of combined systolic and diastolic CHF, CAD s/p CABG, chronic anemia, CKD stage IV, diabetes mellitus type 2, OSA on CPAP.  He presents for chest pain.  In ED>   High sensitivity troponin 26 >> 130, Cr ~ 6 Cardiology evaluated patient in ED, recommended IV Heparin and Nitro and asked for nephrology consult before pursuing a cath.  The patient was admitted by [facility].    Subjective: He's sleepy but awakens to communicate. No complaints. He and his wife have declined a cath and would like input from Dr Scarlette Calico before proceeding.  Assessment & Plan:   Principal Problem:   NSTEMI (non-ST elevated myocardial infarction)  - CAD s/p CABG and multiple PCIs - on ASA, Plavix, statin, Ranexa, Imdur and Heparin - per cardiology, initial plan for cath on 07/13/19 but I have told Dr Acie Fredrickson that the patient and his wife would like to postpone it.  Active Problems:   Polycystic kidney disease- CKD 5 - Cr ~ 6-7 range, GRF 8 - he has an AV fistula already - renal planning on starting dialysis today - the patient has an AV fistula  Abdominal distension - no pain- he has good bowel sounds and states he has been having normal BMs - will follow to see if it decreases with dialysis  Chronic diastolic CHF - as he is starting dialysis, he should no longer need Lasix  HTN - hold Amlodipine, Hydralazine, Coreg as BP is low and he will be dialyzed today- also on Imdur     OSA on CPAP - CPAP ordered in hospital    Type 2 diabetes with nephropathy  - A1c 6.1 - he takes 20 U of 70/30 BID and Ozempic on Saturdays - 70/30 resumed by admitting doctor- follow sugars-      Gout - Allopurinal    Anemia of chronic disease - renal checking Iron levels as well    Time spent in minutes: 35 DVT  prophylaxis: Heparin infusion Code Status: Full code Family Communication:  Disposition Plan: await cath- will need Clipping afterwards Consultants:   Cardiology  nephrology  Procedures:   none Antimicrobials:  Anti-infectives (From admission, onward)   None       Objective: Vitals:   07/11/19 0014 07/11/19 0453 07/11/19 0735 07/11/19 0808  BP: 102/63 106/74    Pulse: 69 69    Resp: 19 17  (!) 21  Temp:  (!) 97.3 F (36.3 C)  (!) 97.4 F (36.3 C)  TempSrc:  Oral  Oral  SpO2: 97% 94% 100%   Weight:  110.6 kg    Height:        Intake/Output Summary (Last 24 hours) at 07/11/2019 1258 Last data filed at 07/11/2019 0700 Gross per 24 hour  Intake 208.7 ml  Output 200 ml  Net 8.7 ml   Filed Weights   07/10/19 1150 07/10/19 2016 07/11/19 0453  Weight: 101.6 kg 110.8 kg 110.6 kg    Examination: General exam: Appears comfortable  HEENT: PERRLA, oral mucosa moist, no sclera icterus or thrush Respiratory system: Clear to auscultation. Respiratory effort normal. Cardiovascular system: S1 & S2 heard, RRR.   Gastrointestinal system: Abdomen soft, non-tender, severely distended, Normal bowel sounds. Central nervous system: Oriented x 3. No focal neurological deficits. Extremities: No cyanosis, clubbing  or edema Skin: No rashes or ulcers Psychiatry:  Mood & affect appropriate.    Data Reviewed: I have personally reviewed following labs and imaging studies  CBC: Recent Labs  Lab 07/10/19 0918 07/11/19 0040  WBC 7.6 8.8  NEUTROABS  --  6.9  HGB 8.1* 8.5*  HCT 25.5* 26.7*  MCV 88.5 90.8  PLT 189 [id number]   Basic Metabolic Panel: Recent Labs  Lab 07/10/19 0918 07/11/19 0040  NA 134* 133*  K 3.7 4.1  CL 100 98  CO2 19* 21*  GLUCOSE 215* 165*  BUN 67* 69*  CREATININE 6.26* 6.74*  CALCIUM 8.9 8.7*   GFR: Estimated Creatinine Clearance: 13.7 mL/min (A) (by C-G formula based on SCr of 6.74 mg/dL (H)). Liver Function Tests: No results for input(s): AST, ALT,  ALKPHOS, BILITOT, PROT, ALBUMIN in the last 168 hours. No results for input(s): LIPASE, AMYLASE in the last 168 hours. No results for input(s): AMMONIA in the last 168 hours. Coagulation Profile: No results for input(s): INR, PROTIME in the last 168 hours. Cardiac Enzymes: No results for input(s): CKTOTAL, CKMB, CKMBINDEX, TROPONINI in the last 168 hours. BNP (last 3 results) No results for input(s): PROBNP in the last 8760 hours. HbA1C: Recent Labs    07/10/19 2052  HGBA1C 6.1*   CBG: Recent Labs  Lab 07/10/19 1816 07/11/19 0748 07/11/19 1148  GLUCAP 85 71 130*   Lipid Profile: No results for input(s): CHOL, HDL, LDLCALC, TRIG, CHOLHDL, LDLDIRECT in the last 72 hours. Thyroid Function Tests: No results for input(s): TSH, T4TOTAL, FREET4, T3FREE, THYROIDAB in the last 72 hours. Anemia Panel: Recent Labs    07/11/19 0040  TIBC 263  IRON 55   Urine analysis:    Component Value Date/Time   COLORURINE STRAW (A) 06/20/2018 2033   APPEARANCEUR CLEAR 06/20/2018 2033   LABSPEC 1.006 06/20/2018 2033   PHURINE 6.0 06/20/2018 2033   GLUCOSEU NEGATIVE 06/20/2018 2033   HGBUR NEGATIVE 06/20/2018 2033   BILIRUBINUR NEGATIVE 06/20/2018 2033   [place] NEGATIVE 06/20/2018 2033   PROTEINUR NEGATIVE 06/20/2018 2033   UROBILINOGEN 1.0 07/17/2015 1425   NITRITE NEGATIVE 06/20/2018 2033   LEUKOCYTESUR NEGATIVE 06/20/2018 2033   Sepsis Labs: @LABRCNTIP (procalcitonin:4,lacticidven:4) ) Recent Results (from the past 240 hour(s))  SARS Coronavirus 2 by RT PCR (hospital order, performed in [facility] hospital lab) Nasopharyngeal Nasopharyngeal Swab     Status: None   Collection Time: 07/10/19  1:30 PM   Specimen: Nasopharyngeal Swab  Result Value Ref Range Status   SARS Coronavirus 2 NEGATIVE NEGATIVE Final    Comment: (NOTE) If result is NEGATIVE SARS-CoV-2 target nucleic acids are NOT DETECTED. The SARS-CoV-2 RNA is generally detectable in upper and lower  respiratory  specimens during the acute phase of infection. The lowest  concentration of SARS-CoV-2 viral copies this assay can detect is 250  copies / mL. A negative result does not preclude SARS-CoV-2 infection  and should not be used as the sole basis for treatment or other  patient management decisions.  A negative result may occur with  improper specimen collection / handling, submission of specimen other  than nasopharyngeal swab, presence of viral mutation(s) within the  areas targeted by this assay, and inadequate number of viral copies  (<250 copies / mL). A negative result must be combined with clinical  observations, patient history, and epidemiological information. If result is POSITIVE SARS-CoV-2 target nucleic acids are DETECTED. The SARS-CoV-2 RNA is generally detectable in upper and lower  respiratory specimens dur ing  the acute phase of infection.  Positive  results are indicative of active infection with SARS-CoV-2.  Clinical  correlation with patient history and other diagnostic information is  necessary to determine patient infection status.  Positive results do  not rule out bacterial infection or co-infection with other viruses. If result is PRESUMPTIVE POSTIVE SARS-CoV-2 nucleic acids MAY BE PRESENT.   A presumptive positive result was obtained on the submitted specimen  and confirmed on repeat testing.  While 2019 novel coronavirus  (SARS-CoV-2) nucleic acids may be present in the submitted sample  additional confirmatory testing may be necessary for epidemiological  and / or clinical management purposes  to differentiate between  SARS-CoV-2 and other Sarbecovirus currently known to infect humans.  If clinically indicated additional testing with an alternate test  methodology [phone]) is advised. The SARS-CoV-2 RNA is generally  detectable in upper and lower respiratory sp ecimens during the acute  phase of infection. The expected result is Negative. Fact Sheet for  Patients:  StrictlyIdeas.no Fact Sheet for Healthcare Providers: BankingDealers.co.za This test is not yet approved or cleared by the [place] FDA and has been authorized for detection and/or diagnosis of SARS-CoV-2 by FDA under an Emergency Use Authorization (EUA).  This EUA will remain in effect (meaning this test can be used) for the duration of the COVID-19 declaration under Section 564(b)(1) of the Act, 21 U.S.C. section 360bbb-3(b)(1), unless the authorization is terminated or revoked sooner. Performed at [facility], [address] [place], [place] [address]          Radiology Studies: Dg Chest 2 View  Result Date: 07/10/2019 CLINICAL DATA:  Chest pain. Swelling in the legs and feet. EXAM: CHEST - 2 VIEW COMPARISON:  06/22/2018 FINDINGS: Stable enlarged cardiac silhouette and post CABG changes. Anterior epicardial pacemaker lead. Coronary artery stents. Mild diffuse peribronchial thickening and accentuation of the interstitial markings with improvement. Normal vasculature. No definite pleural fluid. Unremarkable bones. IMPRESSION: 1. Stable cardiomegaly. 2. Improved changes of congestive heart failure superimposed on mild chronic interstitial lung disease. Electronically Signed   By: Claudie Revering M.D.   On: 07/10/2019 09:49      Scheduled Meds: . allopurinol  100 mg Oral BID  . amLODipine  10 mg Oral Daily  . aspirin  81 mg Oral Daily  . atorvastatin  80 mg Oral QHS  . carvedilol  50 mg Oral BID WC  . Chlorhexidine Gluconate Cloth  6 each Topical Q0600  . clopidogrel  75 mg Oral Daily  . darbepoetin (ARANESP) injection - NON-DIALYSIS  200 mcg Subcutaneous Q Sat-1800  . gabapentin  300 mg Oral Daily  . hydrALAZINE  50 mg Oral TID  . isosorbide mononitrate  120 mg Oral BID  . pantoprazole  40 mg Oral Daily  . ranolazine  500 mg Oral BID  . sodium chloride flush  3 mL Intravenous Once  . sodium chloride flush  3  mL Intravenous Q12H   Continuous Infusions: . sodium chloride    . heparin 1,100 Units/hr (07/11/19 1118)     LOS: 1 day      Debbe Odea, MD [facility] Pager: [email or web address] Password [email or web address] 07/11/2019, 12:58 PM

## 2019-07-11 NOTE — Progress Notes (Signed)
[facility] for IV Heparin Indication: chest pain/ACS  Allergies  Allergen Reactions  . Cleocin [Clindamycin Hcl] Anaphylaxis and Swelling  . Glimepiride [Amaryl] Other (See Comments)    Elevates liver function   . Clarithromycin Itching and Other (See Comments)    "Biaxin" Eyes itch and burn  . Colchicine Other (See Comments)    Affected kidneys   . Ciprocin-Fluocin-Procin [Fluocinolone Acetonide] Rash    Patient Measurements: Height: 5\' 6"  (167.6 cm) Weight: 224 lb (101.6 kg) IBW/kg (Calculated) : 63.8 Heparin Dosing Weight: 86.3 kg  Vital Signs: Temp: 97.5 F (36.4 C) (10/30 2016) Temp Source: Oral (10/30 2016) BP: 102/63 (10/31 0014) Pulse Rate: 69 (10/31 0014)  Labs: Recent Labs    07/10/19 0918 07/10/19 1118 07/11/19 0040  HGB 8.1*  --   --   HCT 25.5*  --   --   PLT 189  --   --   HEPARINUNFRC  --   --  0.51  CREATININE 6.26*  --   --   TROPONINIHS 26* 130*  --     Estimated Creatinine Clearance: 14.2 mL/min (A) (by C-G formula based on SCr of 6.26 mg/dL (H)).       Assessment: 58 year old male who presented to the ED for chest pain. Pharmacy consulted for IV Heparin dosing for ACS.   Hgb low at 8.1. Platelets 189. No bleeding noted.  High sensitivity troponin up from 26 to 130.  Patient has stage IV CKD and SCr is up at 6.26.  Initial heparin level 0.51 units/ml   Goal of Therapy:  Heparin level 0.3-0.7 units/ml Monitor platelets by anticoagulation protocol: Yes   Plan:  Continue heparin at 1100 units/hr Check heparin level later today to confirm  Thanks for allowing pharmacy to be a part of this patient's care.  Excell Seltzer, PharmD Clinical Pharmacist

## 2019-07-11 NOTE — Progress Notes (Signed)
[facility] ROUNDING NOTE   Subjective:   This is a 58 year old gentleman past medical history of combined systolic diastolic heart failure followed by cardiology.  He status post CABG he has a history of chronic anemia CKD stage IV/V dialysis appears to be imminent.  Diabetes mellitus type 2 obstructive sleep apnea using CPAP.  He was admitted with NSTEMI has been evaluated and seen by cardiology appreciate assistance from Dr. Shelbie Ammons.  We had a discussion this morning about the necessity to start dialysis.  He is agreeable to do this.  He has an AV fistula in his left upper arm that is accessible and has a good thrill and bruit.  Blood pressure 100/74 pulse 75 temperature 97.4 O2 sats 91% on CPAP urine output 200 cc 07/11/2019  Sodium 133 potassium 4.1 chloride 98 CO2 21 BUN 69 creatinine 6.74 glucose 165 WBC 8.8 hemoglobin 8.5 platelets 185  Allopurinol 100 mg twice daily amlodipine 10 mg daily aspirin 81 mg day Lipitor 80 mg daily Coreg 50 mg twice daily Plavix 25 mg daily darbepoetin 200 mcg every Saturday, gabapentin 300 mg daily hydralazine 50 mg 3 times daily, Imdur 120 mg twice daily, Protonix 40 mg daily Ranexa 500 mg twice daily  Objective:  Vital signs in last 24 hours:  Temp:  [97.3 F (36.3 C)-97.5 F (36.4 C)] 97.4 F (36.3 C) (10/31 0808) Pulse Rate:  [69-81] 69 (10/31 0453) Resp:  [15-21] 21 (10/31 0808) BP: (95-126)/(63-83) 106/74 (10/31 0453) SpO2:  [89 %-100 %] 100 % (10/31 0735) Weight:  [101.6 kg-110.8 kg] 110.6 kg (10/31 0453)  Weight change:  Filed Weights   07/10/19 1150 07/10/19 2016 07/11/19 0453  Weight: 101.6 kg 110.8 kg 110.6 kg    Intake/Output: I/O last 3 completed shifts: In: 208.7 [I.V.:208.7] Out: 200 [Urine:200]   Intake/Output this shift:  No intake/output data recorded. Alert nondistressed CVS- RRR 3/6 systolic murmur RS- CTA no wheezes or rales ABD-distended and tympanic EXT-2+ edema AV fistula left arm thrill and  bruit   Basic Metabolic Panel: Recent Labs  Lab 07/10/19 0918 07/11/19 0040  NA 134* 133*  K 3.7 4.1  CL 100 98  CO2 19* 21*  GLUCOSE 215* 165*  BUN 67* 69*  CREATININE 6.26* 6.74*  CALCIUM 8.9 8.7*    Liver Function Tests: No results for input(s): AST, ALT, ALKPHOS, BILITOT, PROT, ALBUMIN in the last 168 hours. No results for input(s): LIPASE, AMYLASE in the last 168 hours. No results for input(s): AMMONIA in the last 168 hours.  CBC: Recent Labs  Lab 07/10/19 0918 07/11/19 0040  WBC 7.6 8.8  NEUTROABS  --  6.9  HGB 8.1* 8.5*  HCT 25.5* 26.7*  MCV 88.5 90.8  PLT 189 185    Cardiac Enzymes: No results for input(s): CKTOTAL, CKMB, CKMBINDEX, TROPONINI in the last 168 hours.  BNP: Invalid input(s): POCBNP  CBG: Recent Labs  Lab 07/10/19 1816 07/11/19 0748  GLUCAP 85 71    Microbiology: Results for orders placed or performed during the hospital encounter of 07/10/19  SARS Coronavirus 2 by RT PCR (hospital order, performed in [facility] hospital lab) Nasopharyngeal Nasopharyngeal Swab     Status: None   Collection Time: 07/10/19  1:30 PM   Specimen: Nasopharyngeal Swab  Result Value Ref Range Status   SARS Coronavirus 2 NEGATIVE NEGATIVE Final    Comment: (NOTE) If result is NEGATIVE SARS-CoV-2 target nucleic acids are NOT DETECTED. The SARS-CoV-2 RNA is generally detectable in upper and lower  respiratory  specimens during the acute phase of infection. The lowest  concentration of SARS-CoV-2 viral copies this assay can detect is 250  copies / mL. A negative result does not preclude SARS-CoV-2 infection  and should not be used as the sole basis for treatment or other  patient management decisions.  A negative result may occur with  improper specimen collection / handling, submission of specimen other  than nasopharyngeal swab, presence of viral mutation(s) within the  areas targeted by this assay, and inadequate number of viral copies  (<250 copies /  mL). A negative result must be combined with clinical  observations, patient history, and epidemiological information. If result is POSITIVE SARS-CoV-2 target nucleic acids are DETECTED. The SARS-CoV-2 RNA is generally detectable in upper and lower  respiratory specimens dur ing the acute phase of infection.  Positive  results are indicative of active infection with SARS-CoV-2.  Clinical  correlation with patient history and other diagnostic information is  necessary to determine patient infection status.  Positive results do  not rule out bacterial infection or co-infection with other viruses. If result is PRESUMPTIVE POSTIVE SARS-CoV-2 nucleic acids MAY BE PRESENT.   A presumptive positive result was obtained on the submitted specimen  and confirmed on repeat testing.  While 2019 novel coronavirus  (SARS-CoV-2) nucleic acids may be present in the submitted sample  additional confirmatory testing may be necessary for epidemiological  and / or clinical management purposes  to differentiate between  SARS-CoV-2 and other Sarbecovirus currently known to infect humans.  If clinically indicated additional testing with an alternate test  methodology [phone]) is advised. The SARS-CoV-2 RNA is generally  detectable in upper and lower respiratory sp ecimens during the acute  phase of infection. The expected result is Negative. Fact Sheet for Patients:  StrictlyIdeas.no Fact Sheet for Healthcare Providers: BankingDealers.co.za This test is not yet approved or cleared by the [place] FDA and has been authorized for detection and/or diagnosis of SARS-CoV-2 by FDA under an Emergency Use Authorization (EUA).  This EUA will remain in effect (meaning this test can be used) for the duration of the COVID-19 declaration under Section 564(b)(1) of the Act, 21 U.S.C. section 360bbb-3(b)(1), unless the authorization is terminated or revoked  sooner. Performed at [facility], [place] [address], [place], [place] [address]     Coagulation Studies: No results for input(s): LABPROT, INR in the last 72 hours.  Urinalysis: No results for input(s): COLORURINE, LABSPEC, PHURINE, GLUCOSEU, HGBUR, BILIRUBINUR, KETONESUR, PROTEINUR, UROBILINOGEN, NITRITE, LEUKOCYTESUR in the last 72 hours.  Invalid input(s): APPERANCEUR    Imaging: Dg Chest 2 View  Result Date: 07/10/2019 CLINICAL DATA:  Chest pain. Swelling in the legs and feet. EXAM: CHEST - 2 VIEW COMPARISON:  06/22/2018 FINDINGS: Stable enlarged cardiac silhouette and post CABG changes. Anterior epicardial pacemaker lead. Coronary artery stents. Mild diffuse peribronchial thickening and accentuation of the interstitial markings with improvement. Normal vasculature. No definite pleural fluid. Unremarkable bones. IMPRESSION: 1. Stable cardiomegaly. 2. Improved changes of congestive heart failure superimposed on mild chronic interstitial lung disease. Electronically Signed   By: Claudie Revering M.D.   On: 07/10/2019 09:49     Medications:   . sodium chloride    . heparin 1,100 Units/hr (07/10/19 1524)   . allopurinol  100 mg Oral BID  . amLODipine  10 mg Oral Daily  . aspirin  81 mg Oral Daily  . atorvastatin  80 mg Oral QHS  . carvedilol  50 mg Oral BID WC  .  clopidogrel  75 mg Oral Daily  . darbepoetin (ARANESP) injection - NON-DIALYSIS  200 mcg Subcutaneous Q Sat-1800  . gabapentin  300 mg Oral Daily  . hydrALAZINE  50 mg Oral TID  . isosorbide mononitrate  120 mg Oral BID  . pantoprazole  40 mg Oral Daily  . ranolazine  500 mg Oral BID  . sodium chloride flush  3 mL Intravenous Once  . sodium chloride flush  3 mL Intravenous Q12H   sodium chloride, acetaminophen, insulin aspart protamine- aspart, nitroGLYCERIN, ondansetron (ZOFRAN) IV, oxymetazoline, sodium chloride flush, traMADol, zolpidem  Assessment/ Plan:  1.Chronic kidney disease stage IV/V progression since  she is diabetic nephropathy.  Patient has had progression of the renal disease has an AV fistula which is ready to start dialysis when needed.  It appears that his kidney disease is progressed.    Not really responding to diuretics will start dialysis 07/11/2019 patient is agreeable 2.  Anemia we will check some iron studies 3.  Bones check PTH continue to follow calcium and phosphorus 4.  Abdominal distention appears like an ileus.  Will check plain x-rays. 5.  Diabetes mellitus as per primary team 6.  Coronary artery disease with history of CABG last cardiac cath 06/2018 continue aspirin Plavix and statin 7.  NSTEMI acute high-sensitivity troponins trending up cardiology consulted Heparin drip started echocardiogram pending continue aspirin Plavix beta-blocker Ranexa proceeding with cardiac catheterization Monday, 07/13/2019   Will proceed with starting dialysis. 8.  Congestive heart failure with diastolic dysfunction increasing lower extremity edema.  IV Lasix administered x1 although chest x-ray does not look particularly worrisome 9.  Hypertension/volume blood pressure appears to be a little low will discontinue hydralazine and isosorbide. 10.  Obstructive sleep apnea requiring CPAP    LOS: 1 Sherril Croon @TODAY @10 :03 AM

## 2019-07-11 NOTE — Progress Notes (Addendum)
ANTICOAGULATION CONSULT NOTE - Initial Consult  Pharmacy Consult for IV Heparin Indication: chest pain/ACS  Allergies  Allergen Reactions  . Cleocin [Clindamycin Hcl] Anaphylaxis and Swelling  . Glimepiride [Amaryl] Other (See Comments)    Elevates liver function   . Clarithromycin Itching and Other (See Comments)    "Biaxin" Eyes itch and burn  . Colchicine Other (See Comments)    Affected kidneys   . Ciprocin-Fluocin-Procin [Fluocinolone Acetonide] Rash    Patient Measurements: Height: 5' 5.5" (166.4 cm) Weight: 243 lb 14.4 oz (110.6 kg) IBW/kg (Calculated) : 62.65 Heparin Dosing Weight: 86.3 kg  Vital Signs: Temp: 97.4 F (36.3 C) (10/31 0808) Temp Source: Oral (10/31 0808) BP: 106/74 (10/31 0453) Pulse Rate: 69 (10/31 0453)  Labs: Recent Labs    07/10/19 0918 07/10/19 1118 07/11/19 0040 07/11/19 1046  HGB 8.1*  --  8.5*  --   HCT 25.5*  --  26.7*  --   PLT 189  --  185  --   HEPARINUNFRC  --   --  0.51 0.41  CREATININE 6.26*  --  6.74*  --   TROPONINIHS 26* 130*  --   --     Estimated Creatinine Clearance: 13.7 mL/min (A) (by C-G formula based on SCr of 6.74 mg/dL (H)).   Medical History: Past Medical History:  Diagnosis Date  . Acute on chronic combined systolic and diastolic CHF (congestive heart failure) ([place]) 10/15/2017  . Chronic anemia   . Chronic combined systolic and diastolic CHF (congestive heart failure) ([place])   . Chronic stable angina ([place])   . CKD (chronic kidney disease), stage IV ([place])   . Coronary artery disease    a. INF MI 1999: PCI of RCA;  b.  extensive stenting of LAD;  c. s/p CABG with RIMA->RCA in 2006;  d. Stable, Low risk MV 05/2012; e. 09/2012 Cath: stable anatomy->med Rx. f. Abnl nuc 11/2013 -> med rx; g. 11/2013 Cath/PCI: s/p DES to RCA. h. Abnormal nuc 11/2016, pt declined cath.   . DM2 (diabetes mellitus, type 2) ([facility])    Type II  . Dyslipidemia   . GERD (gastroesophageal reflux disease)   . History of gout   . Hypertension    . Hypertensive heart disease   . Ischemic cardiomyopathy   . Mild aortic stenosis   . Myocardial infarction [facility]) 1999; 2002; 2003; 2006; ~ 2008  . OSA on CPAP    wears CPAP  . Polycystic kidney disease     Medications:  No anticoagulation prior to admission  Assessment: 58 year old male who presented to the ED for chest pain. Pharmacy consulted for IV Heparin dosing for ACS.   Hgb low at 8.5. Platelets 185. Heparin level is therapeutic at 0.41. No bleeding noted. High sensitivity troponin up from 26 to 130.  Patient has stage IV CKD and SCr is up at 6.74. Pt will transition to IHD per nephrology.  Goal of Therapy:  Heparin level 0.3-0.7 units/ml Monitor platelets by anticoagulation protocol: Yes   Plan:  Start heparin infusion at 1100 units/hr Check anti-Xa level daily while on heparin Continue to monitor H&H and platelets  Sherren Kerns, PharmD PGY1 [facility] Resident Please refer to [facility] for [place] numbers 07/11/2019,12:58 PM

## 2019-07-11 NOTE — Progress Notes (Signed)
Given ranexa and protonix this am. All other medications held because they are antihypertensives or dialyzed out with HD.

## 2019-07-12 ENCOUNTER — Encounter (HOSPITAL_COMMUNITY): Payer: Self-pay

## 2019-07-12 ENCOUNTER — Inpatient Hospital Stay (HOSPITAL_COMMUNITY): Payer: Medicare HMO

## 2019-07-12 DIAGNOSIS — N186 End stage renal disease: Secondary | ICD-10-CM

## 2019-07-12 DIAGNOSIS — I340 Nonrheumatic mitral (valve) insufficiency: Secondary | ICD-10-CM

## 2019-07-12 DIAGNOSIS — I361 Nonrheumatic tricuspid (valve) insufficiency: Secondary | ICD-10-CM

## 2019-07-12 LAB — GLUCOSE, CAPILLARY
Glucose-Capillary: 92 mg/dL (ref 70–99)
Glucose-Capillary: 93 mg/dL (ref 70–99)
Glucose-Capillary: 161 mg/dL — ABNORMAL HIGH (ref 70–99)
Glucose-Capillary: 145 mg/dL — ABNORMAL HIGH (ref 70–99)

## 2019-07-12 LAB — RENAL FUNCTION PANEL
Albumin: 3.5 g/dL (ref 3.5–5.0)
Anion gap: 12 (ref 5–15)
BUN: 61 mg/dL — ABNORMAL HIGH (ref 6–20)
CO2: 21 mmol/L — ABNORMAL LOW (ref 22–32)
Calcium: 8.8 mg/dL — ABNORMAL LOW (ref 8.9–10.3)
Chloride: 99 mmol/L (ref 98–111)
Creatinine, Ser: 6.12 mg/dL — ABNORMAL HIGH (ref 0.61–1.24)
GFR calc Af Amer: 11 mL/min — ABNORMAL LOW (ref >60)
GFR calc non Af Amer: 9 mL/min — ABNORMAL LOW (ref >60)
Glucose, Bld: 95 mg/dL (ref 70–99)
Phosphorus: 4.2 mg/dL (ref 2.5–4.6)
Potassium: 3.7 mmol/L (ref 3.5–5.1)
Sodium: 132 mmol/L — ABNORMAL LOW (ref 135–145)

## 2019-07-12 LAB — CBC
HCT: 26.4 % — ABNORMAL LOW (ref 39.0–52.0)
HCT: 25.1 % — ABNORMAL LOW (ref 39.0–52.0)
Hemoglobin: 8.3 g/dL — ABNORMAL LOW (ref 13.0–17.0)
Hemoglobin: 8.3 g/dL — ABNORMAL LOW (ref 13.0–17.0)
MCH: 28.2 pg (ref 26.0–34.0)
MCH: 29 pg (ref 26.0–34.0)
MCHC: 31.4 g/dL (ref 30.0–36.0)
MCHC: 33.1 g/dL (ref 30.0–36.0)
MCV: 89.8 fL (ref 80.0–100.0)
MCV: 87.8 fL (ref 80.0–100.0)
Platelets: 205 10*3/uL (ref 150–400)
Platelets: 183 10*3/uL (ref 150–400)
RBC: 2.94 MIL/uL — ABNORMAL LOW (ref 4.22–5.81)
RBC: 2.86 MIL/uL — ABNORMAL LOW (ref 4.22–5.81)
RDW: 15.9 % — ABNORMAL HIGH (ref 11.5–15.5)
RDW: 15.9 % — ABNORMAL HIGH (ref 11.5–15.5)
WBC: 10.1 10*3/uL (ref 4.0–10.5)
WBC: 10 10*3/uL (ref 4.0–10.5)
nRBC: 0.3 % — ABNORMAL HIGH (ref 0.0–0.2)
nRBC: 0.3 % — ABNORMAL HIGH (ref 0.0–0.2)

## 2019-07-12 LAB — BASIC METABOLIC PANEL
Anion gap: 13 (ref 5–15)
BUN: 58 mg/dL — ABNORMAL HIGH (ref 6–20)
CO2: 22 mmol/L (ref 22–32)
Calcium: 8.9 mg/dL (ref 8.9–10.3)
Chloride: 100 mmol/L (ref 98–111)
Creatinine, Ser: 5.99 mg/dL — ABNORMAL HIGH (ref 0.61–1.24)
GFR calc Af Amer: 11 mL/min — ABNORMAL LOW (ref >60)
GFR calc non Af Amer: 9 mL/min — ABNORMAL LOW (ref >60)
Glucose, Bld: 96 mg/dL (ref 70–99)
Potassium: 4.1 mmol/L (ref 3.5–5.1)
Sodium: 135 mmol/L (ref 135–145)

## 2019-07-12 LAB — ECHOCARDIOGRAM COMPLETE
Height: 65.5 in
Weight: 3886.4 oz

## 2019-07-12 LAB — HEPARIN LEVEL (UNFRACTIONATED): Heparin Unfractionated: 0.43 IU/mL (ref 0.30–0.70)

## 2019-07-12 LAB — PARATHYROID HORMONE, INTACT (NO CA): PTH: 388 pg/mL — ABNORMAL HIGH (ref 15–65)

## 2019-07-12 MED ORDER — ALTEPLASE 2 MG IJ SOLR: 2.0000 mg | Freq: Once | INTRAMUSCULAR | Status: DC | PRN

## 2019-07-12 MED ORDER — LIDOCAINE HCL (PF) 1 % IJ SOLN: 5.0000 mL | INTRAMUSCULAR | Status: DC | PRN

## 2019-07-12 MED ORDER — CHLORHEXIDINE GLUCONATE CLOTH 2 % EX PADS: 6.0000 | MEDICATED_PAD | Freq: Every day | CUTANEOUS | Status: DC

## 2019-07-12 MED ORDER — LIVING BETTER WITH HEART FAILURE BOOK: Freq: Once | Status: DC

## 2019-07-12 MED ORDER — PERFLUTREN LIPID MICROSPHERE
1.0000 mL | INTRAVENOUS | Status: AC | PRN
  Administered 2019-07-12: 2 mL via INTRAVENOUS
  Filled 2019-07-12: qty 10

## 2019-07-12 MED ORDER — SODIUM CHLORIDE 0.9 % IV SOLN: 100.0000 mL | INTRAVENOUS | Status: DC | PRN

## 2019-07-12 MED ORDER — PENTAFLUOROPROP-TETRAFLUOROETH EX AERO: 1.0000 "application " | INHALATION_SPRAY | CUTANEOUS | Status: DC | PRN

## 2019-07-12 MED ORDER — HEPARIN SODIUM (PORCINE) 1000 UNIT/ML DIALYSIS
1000.0000 [IU] | INTRAMUSCULAR | Status: DC | PRN
  Filled 2019-07-12: qty 1

## 2019-07-12 MED ORDER — LIDOCAINE-PRILOCAINE 2.5-2.5 % EX CREA
1.0000 "application " | TOPICAL_CREAM | CUTANEOUS | Status: DC | PRN
  Filled 2019-07-12: qty 5

## 2019-07-12 MED ORDER — DARBEPOETIN ALFA 200 MCG/0.4ML IJ SOSY: 200.0000 ug | PREFILLED_SYRINGE | INTRAMUSCULAR | Status: DC

## 2019-07-12 MED ORDER — KIDNEY FAILURE BOOK
Freq: Once | Status: AC
  Administered 2019-07-12: 22:00:00
  Filled 2019-07-12: qty 1

## 2019-07-12 NOTE — Progress Notes (Signed)
PROGRESS NOTE    Emidio Ream   [id number]  DOB: 21-Dec-1960  DOA: 07/10/2019 PCP: [name], MD   Brief Narrative:  Matthew Rowe. is a 58 y.o. male with medical history significant of combined systolic and diastolic CHF, CAD s/p CABG, chronic anemia, CKD stage IV, diabetes mellitus type 2, OSA on CPAP.  He presents for chest pain.  In ED>   High sensitivity troponin 26 >> 130, Cr ~ 6 Cardiology evaluated patient in ED, recommended IV Heparin and Nitro and asked for nephrology consult before pursuing a cath.  The patient was admitted by [facility].    Subjective:  Feels much better after dialysis yesterday. Abdomen has not gone down and remains distended. Having urinary incontinence which is normal for hime  Assessment & Plan:   Principal Problem:   NSTEMI (non-ST elevated myocardial infarction)  - CAD s/p CABG and multiple PCIs - on ASA, Plavix, statin, Ranexa, Imdur and Heparin - per cardiology, initial plan for cath on 07/13/19 but I have told Dr Acie Fredrickson that the patient and his wife would like to postpone it.  Active Problems:   Polycystic kidney disease- CKD 5 - Cr ~ 6-7 range, GRF 8 - he has an AV fistula already - dialysis started on 10/31- next is planned for 11/2- Clip process to find and outpt dialysis spot has been started- he states 11 L were removed during dialysis  Abdominal distension - no pain- he has good bowel sounds and states he has been having normal BMs - abdominal xray shows normal bowel gas pattern- ? If he has ascites  Chronic diastolic CHF - new finding of systolic CHF with EF of [id number] on ECHO from today- ECHO shows grade 2 d CHF as well - as he is starting dialysis, he should no longer need Lasix  HTN - holding Amlodipine, Hydralazine, Coreg as BP is low and he has started dialysis -   also on Imdur which I have not stopped due to CAD    OSA on CPAP - CPAP ordered in hospital    Type 2 diabetes with nephropathy  - A1c 6.1 -  he takes 20 U of 70/30 BID and Ozempic on Saturdays - 70/30 resumed by admitting doctor- following sugars-      Gout - Allopurinal    Anemia of chronic disease - renal managing    Time spent in minutes: 35 DVT prophylaxis: Heparin infusion Code Status: Full code Family Communication:  Disposition Plan:   will need Clipping   Consultants:   Cardiology  nephrology  Procedures:  2 D ECHO 1. Left ventricular ejection fraction, by visual estimation, is 35 to 40%. The left ventricle has moderate to severely decreased function. There is no left ventricular hypertrophy.  2. Mid inferoseptal segment and mid inferior segment are abnormal.  3. Definity contrast agent was given IV to delineate the left ventricular endocardial borders.  4. Abnormal septal motion consistent with left bundle branch block.  5. Left ventricular diastolic parameters are consistent with Grade II diastolic dysfunction (pseudonormalization).  6. Global right ventricle has normal systolic function.The right ventricular size is moderately enlarged. No increase in right ventricular wall thickness.  7. Left atrial size was severely dilated.  8. Right atrial size was severely dilated.  9. The mitral valve is normal in structure. Mild mitral valve regurgitation. No evidence of mitral stenosis. 10. The tricuspid valve is normal in structure. Tricuspid valve regurgitation moderate-severe. 11. Aortic valve mean gradient measures 22.0  mmHg. 12. Aortic valve peak gradient measures 39.1 mmHg. 13. The aortic valve is normal in structure. Aortic valve regurgitation is not visualized. Moderate aortic valve stenosis. 14. There is Moderate calcification of the aortic valve. 15. There is Moderate thickening of the aortic valve. 16. The pulmonic valve was normal in structure. Pulmonic valve regurgitation is trivial. 17. Mildly elevated pulmonary artery systolic pressure. 18. The inferior vena cava is normal in size with greater than  50% respiratory variability, suggesting right atrial pressure of 3 mmHg.  Antimicrobials:  Anti-infectives (From admission, onward)   None       Objective: Vitals:   07/12/19 0051 07/12/19 0500 07/12/19 0516 07/12/19 0752  BP: 125/74  119/73 (!) 115/56  Pulse: 81  83 82  Resp: 18  18 16   Temp:   98.4 F (36.9 C) 98.2 F (36.8 C)  TempSrc:   Oral Oral  SpO2: 90%  100% 95%  Weight:  110.2 kg    Height:        Intake/Output Summary (Last 24 hours) at 07/12/2019 1430 Last data filed at 07/12/2019 1400 Gross per 24 hour  Intake 1047.9 ml  Output 2390 ml  Net -1342.1 ml   Filed Weights   07/11/19 0453 07/11/19 1622 07/12/19 0500  Weight: 110.6 kg 116 kg 110.2 kg    Examination: General exam: Appears comfortable  HEENT: PERRLA, oral mucosa moist, no sclera icterus or thrush Respiratory system: Clear to auscultation. Respiratory effort normal. Cardiovascular system: S1 & S2 heard, RRR.   Gastrointestinal system: Abdomen soft, non-tender, severely distended, Normal bowel sounds. Central nervous system: Oriented x 3. No focal neurological deficits. Extremities: No cyanosis, clubbing or edema Skin: No rashes or ulcers Psychiatry:  Mood & affect appropriate.    Data Reviewed: I have personally reviewed following labs and imaging studies  CBC: Recent Labs  Lab 07/10/19 0918 07/11/19 0040 07/12/19 0414 07/12/19 1301  WBC 7.6 8.8 10.1 10.0  NEUTROABS  --  6.9  --   --   HGB 8.1* 8.5* 8.3* 8.3*  HCT 25.5* 26.7* 26.4* 25.1*  MCV 88.5 90.8 89.8 87.8  PLT 189 185 205 [id number]   Basic Metabolic Panel: Recent Labs  Lab 07/10/19 0918 07/11/19 0040 07/12/19 0414 07/12/19 1301  NA 134* 133* 135 132*  K 3.7 4.1 4.1 3.7  CL 100 98 100 99  CO2 19* 21* 22 21*  GLUCOSE 215* 165* 96 95  BUN 67* 69* 58* 61*  CREATININE 6.26* 6.74* 5.99* 6.12*  CALCIUM 8.9 8.7* 8.9 8.8*  PHOS  --   --   --  4.2   GFR: Estimated Creatinine Clearance: 15 mL/min (A) (by C-G formula based on  SCr of 6.12 mg/dL (H)). Liver Function Tests: Recent Labs  Lab 07/12/19 1301  ALBUMIN 3.5   No results for input(s): LIPASE, AMYLASE in the last 168 hours. No results for input(s): AMMONIA in the last 168 hours. Coagulation Profile: No results for input(s): INR, PROTIME in the last 168 hours. Cardiac Enzymes: No results for input(s): CKTOTAL, CKMB, CKMBINDEX, TROPONINI in the last 168 hours. BNP (last 3 results) No results for input(s): PROBNP in the last 8760 hours. HbA1C: Recent Labs    07/10/19 2052  HGBA1C 6.1*   CBG: Recent Labs  Lab 07/11/19 0748 07/11/19 1148 07/11/19 2118 07/12/19 0755 07/12/19 1243  GLUCAP 71 130* 127* 92 93   Lipid Profile: No results for input(s): CHOL, HDL, LDLCALC, TRIG, CHOLHDL, LDLDIRECT in the last 72 hours. Thyroid Function Tests: No  results for input(s): TSH, T4TOTAL, FREET4, T3FREE, THYROIDAB in the last 72 hours. Anemia Panel: Recent Labs    07/11/19 0040  TIBC 263  IRON 55   Urine analysis:    Component Value Date/Time   COLORURINE STRAW (A) 06/20/2018 2033   APPEARANCEUR CLEAR 06/20/2018 2033   LABSPEC 1.006 06/20/2018 2033   PHURINE 6.0 06/20/2018 2033   GLUCOSEU NEGATIVE 06/20/2018 2033   HGBUR NEGATIVE 06/20/2018 2033   BILIRUBINUR NEGATIVE 06/20/2018 2033   [place] NEGATIVE 06/20/2018 2033   PROTEINUR NEGATIVE 06/20/2018 2033   UROBILINOGEN 1.0 07/17/2015 1425   NITRITE NEGATIVE 06/20/2018 2033   LEUKOCYTESUR NEGATIVE 06/20/2018 2033   Sepsis Labs: @LABRCNTIP (procalcitonin:4,lacticidven:4) ) Recent Results (from the past 240 hour(s))  SARS Coronavirus 2 by RT PCR (hospital order, performed in [facility] hospital lab) Nasopharyngeal Nasopharyngeal Swab     Status: None   Collection Time: 07/10/19  1:30 PM   Specimen: Nasopharyngeal Swab  Result Value Ref Range Status   SARS Coronavirus 2 NEGATIVE NEGATIVE Final    Comment: (NOTE) If result is NEGATIVE SARS-CoV-2 target nucleic acids are NOT DETECTED.  The SARS-CoV-2 RNA is generally detectable in upper and lower  respiratory specimens during the acute phase of infection. The lowest  concentration of SARS-CoV-2 viral copies this assay can detect is 250  copies / mL. A negative result does not preclude SARS-CoV-2 infection  and should not be used as the sole basis for treatment or other  patient management decisions.  A negative result may occur with  improper specimen collection / handling, submission of specimen other  than nasopharyngeal swab, presence of viral mutation(s) within the  areas targeted by this assay, and inadequate number of viral copies  (<250 copies / mL). A negative result must be combined with clinical  observations, patient history, and epidemiological information. If result is POSITIVE SARS-CoV-2 target nucleic acids are DETECTED. The SARS-CoV-2 RNA is generally detectable in upper and lower  respiratory specimens dur ing the acute phase of infection.  Positive  results are indicative of active infection with SARS-CoV-2.  Clinical  correlation with patient history and other diagnostic information is  necessary to determine patient infection status.  Positive results do  not rule out bacterial infection or co-infection with other viruses. If result is PRESUMPTIVE POSTIVE SARS-CoV-2 nucleic acids MAY BE PRESENT.   A presumptive positive result was obtained on the submitted specimen  and confirmed on repeat testing.  While 2019 novel coronavirus  (SARS-CoV-2) nucleic acids may be present in the submitted sample  additional confirmatory testing may be necessary for epidemiological  and / or clinical management purposes  to differentiate between  SARS-CoV-2 and other Sarbecovirus currently known to infect humans.  If clinically indicated additional testing with an alternate test  methodology [phone]) is advised. The SARS-CoV-2 RNA is generally  detectable in upper and lower respiratory sp ecimens during the acute   phase of infection. The expected result is Negative. Fact Sheet for Patients:  StrictlyIdeas.no Fact Sheet for Healthcare Providers: BankingDealers.co.za This test is not yet approved or cleared by the [place] FDA and has been authorized for detection and/or diagnosis of SARS-CoV-2 by FDA under an Emergency Use Authorization (EUA).  This EUA will remain in effect (meaning this test can be used) for the duration of the COVID-19 declaration under Section 564(b)(1) of the Act, 21 U.S.C. section 360bbb-3(b)(1), unless the authorization is terminated or revoked sooner. Performed at [facility], [place] [address], [place], [place] [address]  Radiology Studies: Dg Abd 2 Views  Result Date: 07/12/2019 CLINICAL DATA:  Abdominal distension EXAM: ABDOMEN - 2 VIEW COMPARISON:  None. FINDINGS: Visualized bowel gas pattern is nonobstructive. No evidence of soft tissue mass or abnormal fluid collection. No evidence of free intraperitoneal air. No evidence of renal or ureteral calculi. No acute appearing osseous abnormality. IMPRESSION: Nonobstructive bowel gas pattern. No evidence of acute intra-abdominal abnormality. Electronically Signed   By: Franki Cabot M.D.   On: 07/12/2019 11:24      Scheduled Meds: . allopurinol  100 mg Oral BID  . aspirin  81 mg Oral Daily  . atorvastatin  80 mg Oral QHS  . Chlorhexidine Gluconate Cloth  6 each Topical Q0600  . clopidogrel  75 mg Oral Daily  . [START ON 07/18/2019] darbepoetin (ARANESP) injection - DIALYSIS  200 mcg Intravenous Q Sat-HD  . gabapentin  300 mg Oral Daily  . isosorbide mononitrate  120 mg Oral BID  . pantoprazole  40 mg Oral Daily  . ranolazine  500 mg Oral BID  . sodium chloride flush  3 mL Intravenous Once  . sodium chloride flush  3 mL Intravenous Q12H   Continuous Infusions: . sodium chloride    . sodium chloride    . sodium chloride    . heparin 1,100 Units/hr  (07/12/19 1000)     LOS: 2 days      Debbe Odea, MD [facility] Pager: [email or web address] Password [email or web address] 07/12/2019, 2:30 PM

## 2019-07-12 NOTE — Progress Notes (Signed)
  Echocardiogram 2D Echocardiogram has been performed.  [name] 07/12/2019, 12:38 PM

## 2019-07-12 NOTE — Progress Notes (Signed)
ANTICOAGULATION CONSULT NOTE - Initial Consult  Pharmacy Consult for IV Heparin Indication: chest pain/ACS  Allergies  Allergen Reactions  . Cleocin [Clindamycin Hcl] Anaphylaxis and Swelling  . Glimepiride [Amaryl] Other (See Comments)    Elevates liver function   . Clarithromycin Itching and Other (See Comments)    "Biaxin" Eyes itch and burn  . Colchicine Other (See Comments)    Affected kidneys   . Ciprocin-Fluocin-Procin [Fluocinolone Acetonide] Rash    Patient Measurements: Height: 5' 5.5" (166.4 cm) Weight: 242 lb 14.4 oz (110.2 kg) IBW/kg (Calculated) : 62.65 Heparin Dosing Weight: 86.3 kg  Vital Signs: Temp: 98.4 F (36.9 C) (11/01 0516) Temp Source: Oral (11/01 0516) BP: 119/73 (11/01 0516) Pulse Rate: 83 (11/01 0516)  Labs: Recent Labs    07/10/19 0918 07/10/19 1118 07/11/19 0040 07/11/19 1046 07/12/19 0414  HGB 8.1*  --  8.5*  --  8.3*  HCT 25.5*  --  26.7*  --  26.4*  PLT 189  --  185  --  205  HEPARINUNFRC  --   --  0.51 0.41 0.43  CREATININE 6.26*  --  6.74*  --  5.99*  TROPONINIHS 26* 130*  --   --   --     Estimated Creatinine Clearance: 15.3 mL/min (A) (by C-G formula based on SCr of 5.99 mg/dL (H)).   Medical History: Past Medical History:  Diagnosis Date  . Acute on chronic combined systolic and diastolic CHF (congestive heart failure) ([place]) 10/15/2017  . Chronic anemia   . Chronic combined systolic and diastolic CHF (congestive heart failure) ([place])   . Chronic stable angina ([place])   . CKD (chronic kidney disease), stage IV ([place])   . Coronary artery disease    a. INF MI 1999: PCI of RCA;  b.  extensive stenting of LAD;  c. s/p CABG with RIMA->RCA in 2006;  d. Stable, Low risk MV 05/2012; e. 09/2012 Cath: stable anatomy->med Rx. f. Abnl nuc 11/2013 -> med rx; g. 11/2013 Cath/PCI: s/p DES to RCA. h. Abnormal nuc 11/2016, pt declined cath.   . DM2 (diabetes mellitus, type 2) (H[facility])    Type II  . Dyslipidemia   . GERD (gastroesophageal reflux  disease)   . History of gout   . Hypertension   . Hypertensive heart disease   . Ischemic cardiomyopathy   . Mild aortic stenosis   . Myocardial infarction [facility]) 1999; 2002; 2003; 2006; ~ 2008  . OSA on CPAP    wears CPAP  . Polycystic kidney disease     Medications:  No anticoagulation prior to admission  Assessment: 58 year old male who presented to the ED for chest pain. Pharmacy consulted for IV Heparin dosing for ACS.   Hgb low but stable at 8.3. Platelets 205. Heparin level is therapeutic at 0.43. No bleeding noted per RN. High sensitivity troponin up from 26 to 130.  Patient's stage IV CKD has worsened and will transition to IHD per nephrology.  Goal of Therapy:  Heparin level 0.3-0.7 units/ml Monitor platelets by anticoagulation protocol: Yes   Plan:  Continue heparin infusion at 1100 units/hr Check anti-Xa level daily while on heparin Continue to monitor H&H and platelets Watch for signs/symptoms of bleeding  Sherren Kerns, PharmD PGY1 Acute Care Pharmacy Resident Please refer to [facility] for [place] numbers 07/12/2019,7:03 AM

## 2019-07-12 NOTE — Progress Notes (Signed)
Progress Note  Patient Name: Matthew ODANIEL Sr. Date of Encounter: 07/12/2019  Primary Cardiologist: Larae Grooms, MD   Subjective   58 year old gentleman with a history of coronary artery disease-status post coronary artery bypass grafting.  He is status post multiple prior PCI's.  He has morbid obesity, hypertension, CKF stabe IV/V   but is not on hemodialysis.  His creatinine is currently 6.  He was admitted yesterday with episodes of chest pain.  His troponin elevations are elevated consistent with a non-ST segment elevation myocardial infarction.    He has progressive renal failure. Did not trust me yesterday to discuss cath because my badge did not say [place]. I explained that [place] had become [place] [facility] but he did not believe me  Wants to speak to Dr. Irish Lack.  I told him that Dr. Emeterio Reeve would not be in the hospital until Wednesday. With considerable explanation, I think he understands that we are all the same group.  He agrees to have the cath.  Dr. Saunders Revel will be in the Cath Lab tomorrow.  He knows Dr. Saunders Revel and agrees to have a cath with Dr. Saunders Revel.   Inpatient Medications    Scheduled Meds: . allopurinol  100 mg Oral BID  . aspirin  81 mg Oral Daily  . atorvastatin  80 mg Oral QHS  . Chlorhexidine Gluconate Cloth  6 each Topical Q0600  . clopidogrel  75 mg Oral Daily  . [START ON 07/18/2019] darbepoetin (ARANESP) injection - DIALYSIS  200 mcg Intravenous Q Sat-HD  . gabapentin  300 mg Oral Daily  . isosorbide mononitrate  120 mg Oral BID  . pantoprazole  40 mg Oral Daily  . ranolazine  500 mg Oral BID  . sodium chloride flush  3 mL Intravenous Once  . sodium chloride flush  3 mL Intravenous Q12H   Continuous Infusions: . sodium chloride    . heparin 1,100 Units/hr (07/11/19 1118)   PRN Meds: sodium chloride, acetaminophen, guaiFENesin-dextromethorphan, insulin aspart protamine- aspart, nitroGLYCERIN, ondansetron (ZOFRAN) IV,  oxymetazoline, sodium chloride flush, traMADol, zolpidem   Vital Signs    Vitals:   07/11/19 2300 07/12/19 0051 07/12/19 0500 07/12/19 0516  BP: 125/74 125/74  119/73  Pulse: 81 81  83  Resp: (!) 21 18  18   Temp: 97.8 F (36.6 C)   98.4 F (36.9 C)  TempSrc: Axillary   Oral  SpO2: (!) 89% 90%  100%  Weight:   110.2 kg   Height:        Intake/Output Summary (Last 24 hours) at 07/12/2019 0735 Last data filed at 07/12/2019 0453 Gross per 24 hour  Intake 220 ml  Output 2390 ml  Net -2170 ml   Last 3 Weights 07/12/2019 07/11/2019 07/11/2019  Weight (lbs) 242 lb 14.4 oz 255 lb 11.7 oz 243 lb 14.4 oz  Weight (kg) 110.179 kg 116 kg 110.632 kg      Telemetry     - Personally Reviewed  ECG     - Personally Reviewed  Physical Exam   Physical Exam: Blood pressure (!) 115/56, pulse 82, temperature 98.2 F (36.8 C), temperature source Oral, resp. rate 16, height 5' 5.5" (1.664 m), weight 110.2 kg, SpO2 95 %.  GEN:   Middle-aged, morbidly obese gentleman, no acute distress HEENT: Normal NECK: No JVD; No carotid bruits LYMPHATICS: No lymphadenopathy CARDIAC: RRR  RESPIRATORY:  Clear to auscultation without rales, wheezing or rhonchi  ABDOMEN: Obese abdomen.  Nontender MUSCULOSKELETAL: Leg edema has  significantly improved. SKIN: Warm and dry NEUROLOGIC:  Alert and oriented x 3   Labs    High Sensitivity Troponin:   Recent Labs  Lab 07/10/19 0918 07/10/19 1118  TROPONINIHS 26* 130*      Chemistry Recent Labs  Lab 07/10/19 0918 07/11/19 0040 07/12/19 0414  NA 134* 133* 135  K 3.7 4.1 4.1  CL 100 98 100  CO2 19* 21* 22  GLUCOSE 215* 165* 96  BUN 67* 69* 58*  CREATININE 6.26* 6.74* 5.99*  CALCIUM 8.9 8.7* 8.9  GFRNONAA 9* 8* 9*  GFRAA 10* 9* 11*  ANIONGAP 15 14 13      Hematology Recent Labs  Lab 07/10/19 0918 07/11/19 0040 07/12/19 0414  WBC 7.6 8.8 10.1  RBC 2.88* 2.94* 2.94*  HGB 8.1* 8.5* 8.3*  HCT 25.5* 26.7* 26.4*  MCV 88.5 90.8 89.8  MCH  28.1 28.9 28.2  MCHC 31.8 31.8 31.4  RDW 15.5 15.9* 15.9*  PLT 189 185 205    BNP Recent Labs  Lab 07/10/19 0918  BNP 1,468.8*     DDimer No results for input(s): DDIMER in the last 168 hours.   Radiology    Dg Chest 2 View  Result Date: 07/10/2019 CLINICAL DATA:  Chest pain. Swelling in the legs and feet. EXAM: CHEST - 2 VIEW COMPARISON:  06/22/2018 FINDINGS: Stable enlarged cardiac silhouette and post CABG changes. Anterior epicardial pacemaker lead. Coronary artery stents. Mild diffuse peribronchial thickening and accentuation of the interstitial markings with improvement. Normal vasculature. No definite pleural fluid. Unremarkable bones. IMPRESSION: 1. Stable cardiomegaly. 2. Improved changes of congestive heart failure superimposed on mild chronic interstitial lung disease. Electronically Signed   By: Claudie Revering M.D.   On: 07/10/2019 09:49    Cardiac Studies     Patient Profile     58 y.o. male  With CAD   Assessment & Plan   1.  Unstable angina/NSTEMI: The patient presents with symptoms consistent with unstable angina and non-ST segment elevation myocardial infarction.    He did not go for emergent heart catheterization because of his severe CKD.  He has started on dialysis now.  He agrees to go for heart catheterization tomorrow with Dr. Saunders Revel.  We discussed the risks, benefits, options.  He understands and agrees to proceed.  2.  End-stage renal disease: Creatinine is slightly down today after dialysis yesterday. His net output so far during this hospitalization is 2.1 L.       For questions or updates, please contact [place] Please consult [email or web address] for contact info under        Signed, Mertie Moores, MD  07/12/2019, 7:35 AM

## 2019-07-12 NOTE — Progress Notes (Signed)
Matthew Rowe   Subjective:   This is a 58 year old gentleman past medical history of combined systolic diastolic heart failure followed by cardiology.  He status post CABG he has a history of chronic anemia CKD stage IV/V dialysis appears to be imminent.  Diabetes mellitus type 2 obstructive sleep apnea using CPAP.  He was admitted with NSTEMI has been evaluated and seen by cardiology appreciate assistance from Dr. Shelbie Ammons.  Underwent successful dialysis treatment 07/11/2019 with removal of 2. L.  .  He has an AV fistula in his left upper arm that is accessible and has a good thrill and bruit.  He will need clip process started 07/13/2019  Blood pressure 115/56 pulse 83 temperature 98.2 O2 sats 91% 3 L nasal cannula  Sodium 135 potassium 4.1 chloride 100 CO2 22 BUN 58 creatinine 5.99 glucose 96 WBC 10.1 hemoglobin 8.3 platelets 205  Allopurinol 100 mg twice daily amlodipine 10 mg daily aspirin 81 mg day Lipitor 80 mg daily Coreg 50 mg twice daily Plavix 25 mg daily darbepoetin 200 mcg every Saturday, gabapentin 300 mg daily hydralazine 50 mg 3 times daily, Imdur 120 mg twice daily, Protonix 40 mg daily Ranexa 500 mg twice daily  Objective:  Vital signs in last 24 hours:  Temp:  [97.7 F (36.5 C)-98.4 F (36.9 C)] 98.2 F (36.8 C) (11/01 0752) Pulse Rate:  [69-85] 82 (11/01 0752) Resp:  [16-25] 16 (11/01 0752) BP: (114-188)/(56-114) 115/56 (11/01 0752) SpO2:  [89 %-100 %] 95 % (11/01 0752) Weight:  [110.2 kg-116 kg] 110.2 kg (11/01 0500)  Weight change: 14.4 kg Filed Weights   07/11/19 0453 07/11/19 1622 07/12/19 0500  Weight: 110.6 kg 116 kg 110.2 kg    Intake/Output: I/O last 3 completed shifts: In: 428.7 [P.O.:220; I.V.:208.7] Out: 2590 [Urine:590; Other:2000]   Intake/Output this shift:  Total I/O In: 504 [P.O.:460; I.V.:44] Out: -  Alert nondistressed CVS- RRR 3/6 systolic murmur RS- CTA no wheezes or rales ABD-distended and tympanic EXT-2+  edema AV fistula left arm thrill and bruit   Basic Metabolic Panel: Recent Labs  Lab 07/10/19 0918 07/11/19 0040 07/12/19 0414  NA 134* 133* 135  K 3.7 4.1 4.1  CL 100 98 100  CO2 19* 21* 22  GLUCOSE 215* 165* 96  BUN 67* 69* 58*  CREATININE 6.26* 6.74* 5.99*  CALCIUM 8.9 8.7* 8.9    Liver Function Tests: No results for input(s): AST, ALT, ALKPHOS, BILITOT, PROT, ALBUMIN in the last 168 hours. No results for input(s): LIPASE, AMYLASE in the last 168 hours. No results for input(s): AMMONIA in the last 168 hours.  CBC: Recent Labs  Lab 07/10/19 0918 07/11/19 0040 07/12/19 0414  WBC 7.6 8.8 10.1  NEUTROABS  --  6.9  --   HGB 8.1* 8.5* 8.3*  HCT 25.5* 26.7* 26.4*  MCV 88.5 90.8 89.8  PLT 189 185 205    Cardiac Enzymes: No results for input(s): CKTOTAL, CKMB, CKMBINDEX, TROPONINI in the last 168 hours.  BNP: Invalid input(s): POCBNP  CBG: Recent Labs  Lab 07/10/19 1816 07/11/19 0748 07/11/19 1148 07/11/19 2118 07/12/19 0755  GLUCAP 85 71 130* 127* 14    Microbiology: Results for orders placed or performed during the hospital encounter of 07/10/19  SARS Coronavirus 2 by RT PCR (hospital order, performed in [facility] hospital lab) Nasopharyngeal Nasopharyngeal Swab     Status: None   Collection Time: 07/10/19  1:30 PM   Specimen: Nasopharyngeal Swab  Result Value Ref Range Status  SARS Coronavirus 2 NEGATIVE NEGATIVE Final    Comment: ([place]) If result is NEGATIVE SARS-CoV-2 target nucleic acids are NOT DETECTED. The SARS-CoV-2 RNA is generally detectable in upper and lower  respiratory specimens during the acute phase of infection. The lowest  concentration of SARS-CoV-2 viral copies this assay can detect is 250  copies / mL. A negative result does not preclude SARS-CoV-2 infection  and should not be used as the sole basis for treatment or other  patient management decisions.  A negative result may occur with  improper specimen collection / handling,  submission of specimen other  than nasopharyngeal swab, presence of viral mutation(s) within the  areas targeted by this assay, and inadequate number of viral copies  (<250 copies / mL). A negative result must be combined with clinical  observations, patient history, and epidemiological information. If result is POSITIVE SARS-CoV-2 target nucleic acids are DETECTED. The SARS-CoV-2 RNA is generally detectable in upper and lower  respiratory specimens dur ing the acute phase of infection.  Positive  results are indicative of active infection with SARS-CoV-2.  Clinical  correlation with patient history and other diagnostic information is  necessary to determine patient infection status.  Positive results do  not rule out bacterial infection or co-infection with other viruses. If result is PRESUMPTIVE POSTIVE SARS-CoV-2 nucleic acids MAY BE PRESENT.   A presumptive positive result was obtained on the submitted specimen  and confirmed on repeat testing.  While 2019 novel coronavirus  (SARS-CoV-2) nucleic acids may be present in the submitted sample  additional confirmatory testing may be necessary for epidemiological  and / or clinical management purposes  to differentiate between  SARS-CoV-2 and other Sarbecovirus currently known to infect humans.  If clinically indicated additional testing with an alternate test  methodology [phone]) is advised. The SARS-CoV-2 RNA is generally  detectable in upper and lower respiratory sp ecimens during the acute  phase of infection. The expected result is Negative. Fact Sheet for Patients:  StrictlyIdeas.no Fact Sheet for Healthcare Providers: BankingDealers.co.za This test is not yet approved or cleared by the [place] FDA and has been authorized for detection and/or diagnosis of SARS-CoV-2 by FDA under an Emergency Use Authorization (EUA).  This EUA will remain in effect (meaning this test can be  used) for the duration of the COVID-19 declaration under Section 564(b)(1) of the Act, 21 U.S.C. section 360bbb-3(b)(1), unless the authorization is terminated or revoked sooner. Performed at [facility], [place] [address], [place], [place] [address]     Coagulation Studies: No results for input(s): LABPROT, INR in the last 72 hours.  Urinalysis: No results for input(s): COLORURINE, LABSPEC, PHURINE, GLUCOSEU, HGBUR, BILIRUBINUR, KETONESUR, PROTEINUR, UROBILINOGEN, NITRITE, LEUKOCYTESUR in the last 72 hours.  Invalid input(s): APPERANCEUR    Imaging: No results found.   Medications:   . sodium chloride    . heparin 1,100 Units/hr (07/12/19 1000)   . allopurinol  100 mg Oral BID  . aspirin  81 mg Oral Daily  . atorvastatin  80 mg Oral QHS  . Chlorhexidine Gluconate Cloth  6 each Topical Q0600  . clopidogrel  75 mg Oral Daily  . [START ON 07/18/2019] darbepoetin (ARANESP) injection - DIALYSIS  200 mcg Intravenous Q Sat-HD  . gabapentin  300 mg Oral Daily  . isosorbide mononitrate  120 mg Oral BID  . pantoprazole  40 mg Oral Daily  . ranolazine  500 mg Oral BID  . sodium chloride flush  3 mL Intravenous Once  .  sodium chloride flush  3 mL Intravenous Q12H   sodium chloride, acetaminophen, guaiFENesin-dextromethorphan, insulin aspart protamine- aspart, nitroGLYCERIN, ondansetron (ZOFRAN) IV, oxymetazoline, sodium chloride flush, traMADol, zolpidem  Assessment/ Plan:  1.Chronic kidney disease stage IV/V progression since she is diabetic nephropathy.  Patient has had progression of the renal disease has an AV fistula which is ready to start dialysis when needed.  It appears that his kidney disease is progressed.    Patient scheduled for dialysis 07/13/2019 his first dialysis treatment is 07/11/2019 which he tolerated well 2.  Anemia T sats 21% darbepoetin 200 mics weekly 3.  Bones check PTH continue to follow calcium and phosphorus 4.  Abdominal distention appears like an  ileus.  Will check plain x-rays. 5.  Diabetes mellitus as per primary team 6.  Coronary artery disease with history of CABG last cardiac cath 06/2018 continue aspirin Plavix and statin 7.  NSTEMI acute high-sensitivity troponins trending up cardiology consulted Heparin drip started echocardiogram pending continue aspirin Plavix beta-blocker Ranexa proceeding with cardiac catheterization Monday, 07/13/2019  underwent successful dialysis 07/11/2019 next dialysis will be planned for 07/13/2019 8.  Congestive heart failure with diastolic dysfunction increasing lower extremity edema.  IV Lasix administered x1 although chest x-ray does not look particularly worrisome 9.  Hypertension/volume blood pressure a little low discontinued antihypertensives 10.  Obstructive sleep apnea requiring CPAP    LOS: 2 Sherril Croon @TODAY @11 :00 AM

## 2019-07-13 ENCOUNTER — Encounter (HOSPITAL_COMMUNITY)
Admission: EM | Disposition: A | Discharge status: Home Health | Payer: Self-pay | Source: Home / Self Care | Attending: Internal Medicine

## 2019-07-13 DIAGNOSIS — N184 Chronic kidney disease, stage 4 (severe): Secondary | ICD-10-CM

## 2019-07-13 DIAGNOSIS — D631 Anemia in chronic kidney disease: Secondary | ICD-10-CM

## 2019-07-13 DIAGNOSIS — E1121 Type 2 diabetes mellitus with diabetic nephropathy: Secondary | ICD-10-CM

## 2019-07-13 DIAGNOSIS — I25110 Atherosclerotic heart disease of native coronary artery with unstable angina pectoris: Secondary | ICD-10-CM

## 2019-07-13 HISTORY — PX: LEFT HEART CATH AND CORONARY ANGIOGRAPHY: CATH118249

## 2019-07-13 LAB — CBC
HCT: 23.8 % — ABNORMAL LOW (ref 39.0–52.0)
Hemoglobin: 7.6 g/dL — ABNORMAL LOW (ref 13.0–17.0)
MCH: 28.6 pg (ref 26.0–34.0)
MCHC: 31.9 g/dL (ref 30.0–36.0)
MCV: 89.5 fL (ref 80.0–100.0)
Platelets: 187 10*3/uL (ref 150–400)
RBC: 2.66 MIL/uL — ABNORMAL LOW (ref 4.22–5.81)
RDW: 16.2 % — ABNORMAL HIGH (ref 11.5–15.5)
WBC: 9.8 10*3/uL (ref 4.0–10.5)
nRBC: 0.6 % — ABNORMAL HIGH (ref 0.0–0.2)

## 2019-07-13 LAB — GLUCOSE, CAPILLARY
Glucose-Capillary: 124 mg/dL — ABNORMAL HIGH (ref 70–99)
Glucose-Capillary: 86 mg/dL (ref 70–99)
Glucose-Capillary: 102 mg/dL — ABNORMAL HIGH (ref 70–99)
Glucose-Capillary: 99 mg/dL (ref 70–99)
Glucose-Capillary: 145 mg/dL — ABNORMAL HIGH (ref 70–99)
Glucose-Capillary: 135 mg/dL — ABNORMAL HIGH (ref 70–99)

## 2019-07-13 LAB — HEMOGLOBIN AND HEMATOCRIT, BLOOD
HCT: 25 % — ABNORMAL LOW (ref 39.0–52.0)
Hemoglobin: 7.9 g/dL — ABNORMAL LOW (ref 13.0–17.0)

## 2019-07-13 LAB — HEPARIN LEVEL (UNFRACTIONATED): Heparin Unfractionated: 0.26 IU/mL — ABNORMAL LOW (ref 0.30–0.70)

## 2019-07-13 LAB — BASIC METABOLIC PANEL
Anion gap: 16 — ABNORMAL HIGH (ref 5–15)
BUN: 63 mg/dL — ABNORMAL HIGH (ref 6–20)
CO2: 19 mmol/L — ABNORMAL LOW (ref 22–32)
Calcium: 8.9 mg/dL (ref 8.9–10.3)
Chloride: 98 mmol/L (ref 98–111)
Creatinine, Ser: 6.16 mg/dL — ABNORMAL HIGH (ref 0.61–1.24)
GFR calc Af Amer: 11 mL/min — ABNORMAL LOW (ref >60)
GFR calc non Af Amer: 9 mL/min — ABNORMAL LOW (ref >60)
Glucose, Bld: 95 mg/dL (ref 70–99)
Potassium: 4 mmol/L (ref 3.5–5.1)
Sodium: 133 mmol/L — ABNORMAL LOW (ref 135–145)

## 2019-07-13 LAB — HEPATITIS B SURFACE ANTIBODY, QUANTITATIVE: Hep B S AB Quant (Post): <3.1 m[IU]/mL — ABNORMAL LOW (ref >9.9)

## 2019-07-13 LAB — PARATHYROID HORMONE, INTACT (NO CA): PTH: 211 pg/mL — ABNORMAL HIGH (ref 15–65)

## 2019-07-13 LAB — CREATININE, SERUM
Creatinine, Ser: 4.66 mg/dL — ABNORMAL HIGH (ref 0.61–1.24)
GFR calc Af Amer: 15 mL/min — ABNORMAL LOW (ref >60)
GFR calc non Af Amer: 13 mL/min — ABNORMAL LOW (ref >60)

## 2019-07-13 LAB — MRSA PCR SCREENING: MRSA by PCR: NEGATIVE

## 2019-07-13 SURGERY — LEFT HEART CATH AND CORONARY ANGIOGRAPHY
Anesthesia: LOCAL

## 2019-07-13 MED ORDER — SODIUM CHLORIDE 0.9% FLUSH: 3.0000 mL | Freq: Two times a day (BID) | INTRAVENOUS | Status: DC

## 2019-07-13 MED ORDER — FUROSEMIDE 10 MG/ML IJ SOLN
120.0000 mg | Freq: Once | INTRAVENOUS | Status: AC
  Administered 2019-07-13 (×2): 120 mg via INTRAVENOUS
  Filled 2019-07-13: qty 10

## 2019-07-13 MED ORDER — SODIUM CHLORIDE 0.9% FLUSH: 3.0000 mL | INTRAVENOUS | Status: DC | PRN

## 2019-07-13 MED ORDER — SODIUM CHLORIDE 0.9 % IV SOLN
INTRAVENOUS | Status: DC
  Administered 2019-07-13: 17:00:00 via INTRAVENOUS

## 2019-07-13 MED ORDER — HEPARIN (PORCINE) IN NACL 1000-0.9 UT/500ML-% IV SOLN
INTRAVENOUS | Status: DC | PRN
  Administered 2019-07-13 (×2): 500 mL

## 2019-07-13 MED ORDER — SODIUM CHLORIDE 0.9 % IV SOLN: 250.0000 mL | INTRAVENOUS | Status: DC | PRN

## 2019-07-13 MED ORDER — NITROGLYCERIN IN D5W 200-5 MCG/ML-% IV SOLN
INTRAVENOUS | Status: AC
  Filled 2019-07-13: qty 250

## 2019-07-13 MED ORDER — LIDOCAINE HCL (PF) 1 % IJ SOLN
INTRAMUSCULAR | Status: AC
  Filled 2019-07-13: qty 30

## 2019-07-13 MED ORDER — CHLORHEXIDINE GLUCONATE CLOTH 2 % EX PADS
6.0000 | MEDICATED_PAD | Freq: Every day | CUTANEOUS | Status: DC
  Administered 2019-07-15: 6 via TOPICAL

## 2019-07-13 MED ORDER — TRAMADOL HCL 50 MG PO TABS
ORAL_TABLET | ORAL | Status: AC
  Filled 2019-07-13: qty 1

## 2019-07-13 MED ORDER — MIDAZOLAM HCL 2 MG/2ML IJ SOLN
INTRAMUSCULAR | Status: AC
  Filled 2019-07-13: qty 2

## 2019-07-13 MED ORDER — HYDRALAZINE HCL 20 MG/ML IJ SOLN: 10.0000 mg | INTRAMUSCULAR | Status: AC | PRN

## 2019-07-13 MED ORDER — SODIUM CHLORIDE 0.9% FLUSH
3.0000 mL | Freq: Two times a day (BID) | INTRAVENOUS | Status: DC
  Administered 2019-07-14 – 2019-07-16 (×3): 3 mL via INTRAVENOUS

## 2019-07-13 MED ORDER — NITROGLYCERIN 0.4 MG SL SUBL
SUBLINGUAL_TABLET | SUBLINGUAL | Status: AC
  Administered 2019-07-13: 0.4 mg via SUBLINGUAL
  Filled 2019-07-13: qty 1

## 2019-07-13 MED ORDER — IOHEXOL 350 MG/ML SOLN
INTRAVENOUS | Status: DC | PRN
  Administered 2019-07-13: 190 mL via INTRA_ARTERIAL

## 2019-07-13 MED ORDER — FENTANYL CITRATE (PF) 100 MCG/2ML IJ SOLN
INTRAMUSCULAR | Status: DC | PRN
  Administered 2019-07-13 (×2): 25 ug via INTRAVENOUS

## 2019-07-13 MED ORDER — FENTANYL CITRATE (PF) 100 MCG/2ML IJ SOLN
INTRAMUSCULAR | Status: AC
  Filled 2019-07-13: qty 2

## 2019-07-13 MED ORDER — NITROGLYCERIN IN D5W 200-5 MCG/ML-% IV SOLN: 0.0000 ug/min | INTRAVENOUS | Status: DC

## 2019-07-13 MED ORDER — DICLOFENAC SODIUM 1 % TD GEL
2.0000 g | Freq: Four times a day (QID) | TRANSDERMAL | Status: DC
  Administered 2019-07-13 – 2019-07-15 (×6): 2 g via TOPICAL
  Filled 2019-07-13 (×2): qty 100

## 2019-07-13 MED ORDER — LIDOCAINE HCL (PF) 1 % IJ SOLN
INTRAMUSCULAR | Status: DC | PRN
  Administered 2019-07-13: 16 mL

## 2019-07-13 MED ORDER — MIDAZOLAM HCL 2 MG/2ML IJ SOLN
INTRAMUSCULAR | Status: DC | PRN
  Administered 2019-07-13: 0.5 mg via INTRAVENOUS

## 2019-07-13 MED ORDER — HEPARIN (PORCINE) IN NACL 1000-0.9 UT/500ML-% IV SOLN
INTRAVENOUS | Status: AC
  Filled 2019-07-13: qty 1000

## 2019-07-13 MED ORDER — HEPARIN SODIUM (PORCINE) 5000 UNIT/ML IJ SOLN
5000.0000 [IU] | Freq: Three times a day (TID) | INTRAMUSCULAR | Status: DC
  Administered 2019-07-13 – 2019-07-16 (×7): 5000 [IU] via SUBCUTANEOUS
  Filled 2019-07-13 (×8): qty 1

## 2019-07-13 MED ORDER — LABETALOL HCL 5 MG/ML IV SOLN: 10.0000 mg | INTRAVENOUS | Status: AC | PRN

## 2019-07-13 MED ORDER — NITROGLYCERIN 0.4 MG SL SUBL
SUBLINGUAL_TABLET | SUBLINGUAL | Status: AC
  Filled 2019-07-13: qty 1

## 2019-07-13 SURGICAL SUPPLY — 18 items
CATH DXT MULTI JL4 JR4 ANG PIG (CATHETERS) ×2 IMPLANT
CATH INFINITI 5 FR AL2 (CATHETERS) ×2 IMPLANT
CATH INFINITI 5 FR IM (CATHETERS) ×2 IMPLANT
CATH INFINITI 5 FR MPA2 (CATHETERS) ×2 IMPLANT
CATH INFINITI 5FR AL1 (CATHETERS) ×2 IMPLANT
CATH INFINITI 5FR MPB2 (CATHETERS) ×2 IMPLANT
CATH LAUNCHER 5F EBU3.5 (CATHETERS) ×2 IMPLANT
CATH LAUNCHER 5F RADR (CATHETERS) ×1 IMPLANT
CATHETER LAUNCHER 5F RADR (CATHETERS) ×2
HOVERMATT SINGLE USE (MISCELLANEOUS) ×2 IMPLANT
KIT HEART LEFT (KITS) ×2 IMPLANT
KIT MICROPUNCTURE NIT STIFF (SHEATH) ×2 IMPLANT
PACK CARDIAC CATHETERIZATION (CUSTOM PROCEDURE TRAY) ×2 IMPLANT
SHEATH PINNACLE 5F 10CM (SHEATH) ×2 IMPLANT
SHEATH PROBE COVER 6X72 (BAG) ×2 IMPLANT
TRANSDUCER W/STOPCOCK (MISCELLANEOUS) ×2 IMPLANT
TUBING CIL FLEX 10 FLL-RA (TUBING) ×2 IMPLANT
WIRE EMERALD 3MM-J .035X150CM (WIRE) ×2 IMPLANT

## 2019-07-13 NOTE — Progress Notes (Signed)
Renal Navigator reviewed Dr. Jason Nest note from 07/12/19 stating that patient needs OP HD referral initiated. Renal Navigator met with patient at HD bedside, who states understanding of need for ongoing HD. He states he has good support and transportation. Referral submitted to [facility]. Renal Navigator will follow.  Alphonzo Cruise, [place] Renal Navigator [phone]

## 2019-07-13 NOTE — Progress Notes (Signed)
Notified nurse that if more PIV needed. MD will need to be notified to place a line. L arm restricted. Renal patient, not candidate for midline or PICC. VU. Fran Lowes, RN VAST

## 2019-07-13 NOTE — Progress Notes (Signed)
RT placed pt on CPAP dream station for the night on auto titrate max 8 min 6 with 6 Lpm bled into the system. Pt respiratory status stable at this time on CPAP. RT will continue to monitor.

## 2019-07-13 NOTE — Progress Notes (Signed)
After hemodialysis the patient developed excruciating angina pectoris that did not resolve with 3 sublingual nitroglycerin tablets.  That the ECG appears to show a new anterior ST segment elevation compared with previous tracing (that he has a nonspecific intraventricular conduction delay that makes interpretation more difficult). He now understands the need to go to urgent coronary angiography and possible percutaneous revascularization and gave [place] consent to go ahead with the procedure.   His wife was in the room and has informed the rest of the family.

## 2019-07-13 NOTE — TOC Initial Note (Addendum)
Transition of Care (TOC) - Initial/Assessment Note    Patient Details  Name: Matthew KAPRAL Sr. MRN: [id number] Date of Birth: [birth year]  Transition of Care [facility]) CM/SW Contact:    Bethena Roys, RN Phone Number: 07/13/2019, 11:29 AM  Clinical Narrative: Pt presented for new start to HD 2/2 poor response to diuresis. PTA From home with spouse. Per wife patient has PCP Dr Opal Sidles at the [facility]- he gets medications from [facility], and [facility]. Patient in process of being Clipped for outpatient HD. Patient will have transportation via daughter. Per wife: patient wants a second opinion at the [facility]- Cardiology. CM will continue to follow for transition of care needs.                     Expected Discharge Plan: Home/Self Care Barriers to Discharge: Continued Medical Work up   Patient Goals and CMS Choice Patient states their goals for this hospitalization and ongoing recovery are:: "to follow up with cardiology at the [facility]"   Choice offered to / list presented to : NA  Expected Discharge Plan and Services Expected Discharge Plan: Home/Self Care In-house Referral: NA Discharge Planning Services: CM Consult Post Acute Care Choice: NA Living arrangements for the past 2 months: Apartment                     Prior Living Arrangements/Services Living arrangements for the past 2 months: Apartment Lives with:: Spouse Patient language and need for interpreter reviewed:: Yes Do you feel safe going back to the place where you live?: Yes      Need for Family Participation in Patient Care: Yes (Comment) Care giver support system in place?: Yes (comment)   Criminal Activity/Legal Involvement Pertinent to Current Situation/Hospitalization: No - Comment as needed  Activities of Daily Living   ADL Screening (condition at time of admission) Patient's cognitive ability adequate to safely complete daily  activities?: Yes Is the patient deaf or have difficulty hearing?: No Does the patient have difficulty seeing, even when wearing glasses/contacts?: No Does the patient have difficulty concentrating, remembering, or making decisions?: No Patient able to express need for assistance with ADLs?: Yes Does the patient have difficulty dressing or bathing?: No Independently performs ADLs?: Yes (appropriate for developmental age) Does the patient have difficulty walking or climbing stairs?: No Weakness of Legs: None Weakness of Arms/Hands: None  Permission Sought/Granted Permission sought to share information with : Family Supports                Emotional Assessment Appearance:: Appears stated age Attitude/Demeanor/Rapport: Engaged Affect (typically observed): Appropriate Orientation: : Oriented to Situation, Oriented to  Time, Oriented to Place, Oriented to Self Alcohol / Substance Use: Not Applicable Psych Involvement: No (comment)  Admission diagnosis:  Unstable angina ([place]) [I20.0] Elevated troponin [R77.8] Patient Active Problem List   Diagnosis Date Noted  . NSTEMI (non-ST elevated myocardial infarction) ([place]) 07/10/2019  . ESRD (end stage renal disease) ([place]) 07/10/2019  . Coronary artery disease involving native heart with unstable angina pectoris ([place])   . Acute respiratory failure ([place])   . Acute diastolic (congestive) heart failure ([place])   . Unstable angina ([place]) 06/18/2018  . Leukocytosis 06/18/2018  . Chronic pain 06/18/2018  . Insomnia 06/18/2018  . Chest pain 06/17/2018  . Chronic heart failure with preserved ejection fraction ([place]) 06/06/2018  . Anemia due to stage 4 chronic kidney disease ([place]) 02/12/2018  . Coronary  artery disease of native heart with stable angina pectoris ([place]) 10/25/2017  . Chronic diastolic heart failure ([place]) 10/25/2017  . Chronic anemia 10/15/2017  . Elevated troponin 10/15/2017  . CKD (chronic kidney disease) stage 4, GFR 15-29 ml/min  (HCC) 07/22/2017  . Hyperkalemia 04/01/2017  . Non-ST elevation (NSTEMI) myocardial infarction ([place]) 04/01/2017  . Chest pain with moderate risk of acute coronary syndrome 04/01/2017  . Hx of CABG   . Ischemic cardiomyopathy 02/21/2017  . HLD (hyperlipidemia)   . GERD (gastroesophageal reflux disease) 05/08/2015  . Gout 05/08/2015  . MI (myocardial infarction) ([place]) 04/19/2015  . CAD- S/P multiple PCIs, CABG   . Hypertensive heart disease   . Precordial chest pain 11/27/2013  . Financial difficulty 03/13/2013  . Medically noncompliant 03/13/2013  . Type 2 diabetes with nephropathy ([place])   . Obesity-BMI 42 11/27/2012  . Polycystic kidney disease 09/15/2012  . OSA on CPAP 09/15/2012  . Hypertension 03/27/2011  . Hyperlipidemia associated with type 2 diabetes mellitus ([place])    PCP:  [name], MD Pharmacy:   [place], [place] - [address] [place] [place] [address] Phone: [phone] Fax: [facility] [place], [address] [place] [address] Phone: [phone] Fax: [phone]     Social Determinants of Health (SDOH) Interventions    Readmission Risk Interventions Readmission Risk Prevention Plan 07/13/2019  Transportation Screening Complete  PCP or Specialist Appt within 3-5 Days Complete  HRI or [place] Complete  Social Work Consult for [place] Planning/Counseling Complete  Palliative Care Screening Not Applicable  Medication Review Press photographer) Complete  Some recent data might be hidden

## 2019-07-13 NOTE — Progress Notes (Signed)
Site area: Right groin a 5 french arterial sheath was removed Faith Rosenburg RN  Site Prior to Removal:  Level 0  Pressure Applied For 20 MINUTES    Bedrest Beginning at 1505p  Manual:   Yes.    Patient Status During Pull:  stable  Post Pull Groin Site:  Level 0  Post Pull Instructions Given:  Yes.    Post Pull Pulses Present:  Yes.    Dressing Applied:  Yes.    Comments:  VS remain stable

## 2019-07-13 NOTE — Progress Notes (Signed)
13 Pt arrived to room from HD with 10/10 CP. HD had treated with Nitro SL and ultram. Pt stated he had not relief. BP stable, so 1 more Nitro SL given.  EKG with new changes. Dr. Sallyanne Kuster notified of CP and EKG changes. Dr. Sallyanne Kuster ordered IV nitro, started. Pt on 4L oxygen. Wife at bedside. Pt had originally refused to have Cath, but after Dr. Sallyanne Kuster talked to pt's brother he was agreeable. Updated cath lab. Pt walked to with Marin Olp and Gaspar Bidding from cath lab. Carroll Kinds RN

## 2019-07-13 NOTE — Progress Notes (Signed)
PROGRESS NOTE    Matthew Rowe   [id number]  DOB: [birth year]  DOA: 07/10/2019 PCP: [name], MD   Brief Narrative:  Matthew Rowe. is a 58 y.o. male with medical history significant of combined systolic and diastolic CHF, CAD s/p CABG, chronic anemia, CKD stage IV, diabetes mellitus type 2, OSA on CPAP.  He presents for chest pain.  In ED>   High sensitivity troponin 26 >> 130, Cr ~ 6 Cardiology evaluated patient in ED, recommended IV Heparin and Nitro and asked for nephrology consult before pursuing a cath.  [facility]    Subjective: Complained of chest pain after dialysis. He tells me he wants a second opinion at the [facility]:   Principal Problem:   NSTEMI (non-ST elevated myocardial infarction)  - CAD s/p CABG and multiple PCIs - on ASA, Plavix, statin, Ranexa, Imdur and Heparin - per cardiology, initial plan for cath on 07/13/19 but I have told Dr Acie Fredrickson that the patient and his wife would like to postpone it.  - due to chest pain after dialysis, the patient decided to pursue a cath which shows diffuse CAD- medical management recommended - also has chest wall pain with tenderness- add Voltaren gel  Active Problems:   Polycystic kidney disease- CKD 5 - Cr ~ 6-7 range, GRF 8 - he has an AV fistula already - dialysis started on 10/31- next is planned for 11/2- Clip process to find and outpt dialysis spot has been started- he states 11 L were removed during dialysis  Abdominal distension - no pain- he has good bowel sounds and states he has been having normal BMs - abdominal xray shows normal bowel gas pattern- ? If he has ascites  Chronic diastolic CHF - new finding of systolic CHF with EF of [id number] on ECHO from today- ECHO shows grade 2 d CHF as well - as he is starting dialysis, he should no longer need Lasix  HTN - holding Amlodipine, Hydralazine, Coreg as BP is low and he has started dialysis -   also on Imdur  which I have not stopped due to CAD    OSA on CPAP - CPAP ordered in hospital    Type 2 diabetes with nephropathy  - A1c 6.1 - he takes 20 U of 70/30 BID and Ozempic on Saturdays - 70/30 resumed by admitting doctor- following sugars-      Gout - Allopurinal    Anemia of chronic disease - renal managing  Time spent in minutes: 35 DVT prophylaxis: Heparin infusion Code Status: Full code Family Communication:  Disposition Plan:   will need Clipping   Consultants:   Cardiology  nephrology  Procedures:  2 D ECHO 1. Left ventricular ejection fraction, by visual estimation, is 35 to 40%. The left ventricle has moderate to severely decreased function. There is no left ventricular hypertrophy.  2. Mid inferoseptal segment and mid inferior segment are abnormal.  3. Definity contrast agent was given IV to delineate the left ventricular endocardial borders.  4. Abnormal septal motion consistent with left bundle branch block.  5. Left ventricular diastolic parameters are consistent with Grade II diastolic dysfunction (pseudonormalization).  6. Global right ventricle has normal systolic function.The right ventricular size is moderately enlarged. No increase in right ventricular wall thickness.  7. Left atrial size was severely dilated.  8. Right atrial size was severely dilated.  9. The mitral valve is normal in structure. Mild mitral valve  regurgitation. No evidence of mitral stenosis. 10. The tricuspid valve is normal in structure. Tricuspid valve regurgitation moderate-severe. 11. Aortic valve mean gradient measures 22.0 mmHg. 12. Aortic valve peak gradient measures 39.1 mmHg. 13. The aortic valve is normal in structure. Aortic valve regurgitation is not visualized. Moderate aortic valve stenosis. 14. There is Moderate calcification of the aortic valve. 15. There is Moderate thickening of the aortic valve. 16. The pulmonic valve was normal in structure. Pulmonic valve regurgitation is  trivial. 17. Mildly elevated pulmonary artery systolic pressure. 18. The inferior vena cava is normal in size with greater than 50% respiratory variability, suggesting right atrial pressure of 3 mmHg.  Antimicrobials:  Anti-infectives (From admission, onward)   None       Objective: Vitals:   07/13/19 1355 07/13/19 1400 07/13/19 1405 07/13/19 1410  BP: 113/64 115/64 114/60 (!) 118/52  Pulse: 87 87 88 85  Resp: (!) 32 (!) 29 (!) 29 (!) 30  Temp:      TempSrc:      SpO2: 98% 97% 97% 94%  Weight:      Height:        Intake/Output Summary (Last 24 hours) at 07/13/2019 1521 Last data filed at 07/13/2019 0957 Gross per 24 hour  Intake 528.67 ml  Output 2900 ml  Net -2371.33 ml   Filed Weights   07/12/19 0500 07/13/19 0421 07/13/19 0712  Weight: 110.2 kg 111.1 kg 111 kg    Examination: General exam: Appears comfortable  HEENT: PERRLA, oral mucosa moist, no sclera icterus or thrush Respiratory system: Clear to auscultation. Respiratory effort normal. Cardiovascular system: S1 & S2 heard,  No murmurs  Chest wall: tenderness across chest wall noted Gastrointestinal system: Abdomen soft, non-tender,  significantly distended abdomen. Normal bowel sounds   Central nervous system: Alert and oriented. No focal neurological deficits. Extremities: No cyanosis, clubbing or edema Skin: No rashes or ulcers Psychiatry:  Mood & affect appropriate.    Data Reviewed: I have personally reviewed following labs and imaging studies  CBC: Recent Labs  Lab 07/10/19 0918 07/11/19 0040 07/12/19 0414 07/12/19 1301  WBC 7.6 8.8 10.1 10.0  NEUTROABS  --  6.9  --   --   HGB 8.1* 8.5* 8.3* 8.3*  HCT 25.5* 26.7* 26.4* 25.1*  MCV 88.5 90.8 89.8 87.8  PLT 189 185 205 [id number]   Basic Metabolic Panel: Recent Labs  Lab 07/10/19 0918 07/11/19 0040 07/12/19 0414 07/12/19 1301 07/13/19 0442  NA 134* 133* 135 132* 133*  K 3.7 4.1 4.1 3.7 4.0  CL 100 98 100 99 98  CO2 19* 21* 22 21* 19*   GLUCOSE 215* 165* 96 95 95  BUN 67* 69* 58* 61* 63*  CREATININE 6.26* 6.74* 5.99* 6.12* 6.16*  CALCIUM 8.9 8.7* 8.9 8.8* 8.9  PHOS  --   --   --  4.2  --    GFR: Estimated Creatinine Clearance: 15 mL/min (A) (by C-G formula based on SCr of 6.16 mg/dL (H)). Liver Function Tests: Recent Labs  Lab 07/12/19 1301  ALBUMIN 3.5   No results for input(s): LIPASE, AMYLASE in the last 168 hours. No results for input(s): AMMONIA in the last 168 hours. Coagulation Profile: No results for input(s): INR, PROTIME in the last 168 hours. Cardiac Enzymes: No results for input(s): CKTOTAL, CKMB, CKMBINDEX, TROPONINI in the last 168 hours. BNP (last 3 results) No results for input(s): PROBNP in the last 8760 hours. HbA1C: Recent Labs    07/10/19 2052  HGBA1C 6.1*  CBG: Recent Labs  Lab 07/12/19 1243 07/12/19 1645 07/12/19 2107 07/13/19 1130 07/13/19 1345  GLUCAP 93 161* 145* 86 102*   Lipid Profile: No results for input(s): CHOL, HDL, LDLCALC, TRIG, CHOLHDL, LDLDIRECT in the last 72 hours. Thyroid Function Tests: No results for input(s): TSH, T4TOTAL, FREET4, T3FREE, THYROIDAB in the last 72 hours. Anemia Panel: Recent Labs    07/11/19 0040  TIBC 263  IRON 55   Urine analysis:    Component Value Date/Time   COLORURINE STRAW (A) 06/20/2018 2033   APPEARANCEUR CLEAR 06/20/2018 2033   LABSPEC 1.006 06/20/2018 2033   PHURINE 6.0 06/20/2018 2033   GLUCOSEU NEGATIVE 06/20/2018 2033   HGBUR NEGATIVE 06/20/2018 2033   BILIRUBINUR NEGATIVE 06/20/2018 2033   Needles NEGATIVE 06/20/2018 2033   PROTEINUR NEGATIVE 06/20/2018 2033   UROBILINOGEN 1.0 07/17/2015 1425   NITRITE NEGATIVE 06/20/2018 2033   LEUKOCYTESUR NEGATIVE 06/20/2018 2033   Sepsis Labs: @LABRCNTIP (procalcitonin:4,lacticidven:4) ) Recent Results (from the past 240 hour(s))  SARS Coronavirus 2 by RT PCR (hospital order, performed in [facility] hospital lab) Nasopharyngeal Nasopharyngeal Swab     Status: None    Collection Time: 07/10/19  1:30 PM   Specimen: Nasopharyngeal Swab  Result Value Ref Range Status   SARS Coronavirus 2 NEGATIVE NEGATIVE Final    Comment: (NOTE) If result is NEGATIVE SARS-CoV-2 target nucleic acids are NOT DETECTED. The SARS-CoV-2 RNA is generally detectable in upper and lower  respiratory specimens during the acute phase of infection. The lowest  concentration of SARS-CoV-2 viral copies this assay can detect is 250  copies / mL. A negative result does not preclude SARS-CoV-2 infection  and should not be used as the sole basis for treatment or other  patient management decisions.  A negative result may occur with  improper specimen collection / handling, submission of specimen other  than nasopharyngeal swab, presence of viral mutation(s) within the  areas targeted by this assay, and inadequate number of viral copies  (<250 copies / mL). A negative result must be combined with clinical  observations, patient history, and epidemiological information. If result is POSITIVE SARS-CoV-2 target nucleic acids are DETECTED. The SARS-CoV-2 RNA is generally detectable in upper and lower  respiratory specimens dur ing the acute phase of infection.  Positive  results are indicative of active infection with SARS-CoV-2.  Clinical  correlation with patient history and other diagnostic information is  necessary to determine patient infection status.  Positive results do  not rule out bacterial infection or co-infection with other viruses. If result is PRESUMPTIVE POSTIVE SARS-CoV-2 nucleic acids MAY BE PRESENT.   A presumptive positive result was obtained on the submitted specimen  and confirmed on repeat testing.  While 2019 novel coronavirus  (SARS-CoV-2) nucleic acids may be present in the submitted sample  additional confirmatory testing may be necessary for epidemiological  and / or clinical management purposes  to differentiate between  SARS-CoV-2 and other Sarbecovirus  currently known to infect humans.  If clinically indicated additional testing with an alternate test  methodology [phone]) is advised. The SARS-CoV-2 RNA is generally  detectable in upper and lower respiratory sp ecimens during the acute  phase of infection. The expected result is Negative. Fact Sheet for Patients:  StrictlyIdeas.no Fact Sheet for Healthcare Providers: BankingDealers.co.za This test is not yet approved or cleared by the [place] FDA and has been authorized for detection and/or diagnosis of SARS-CoV-2 by FDA under an Emergency Use Authorization (EUA).  This EUA will remain in  effect (meaning this test can be used) for the duration of the COVID-19 declaration under Section 564(b)(1) of the Act, 21 U.S.C. section 360bbb-3(b)(1), unless the authorization is terminated or revoked sooner. Performed at [facility], [place] [address], [address], [place] [address]          Radiology Studies: Dg Abd 2 Views  Result Date: 07/12/2019 CLINICAL DATA:  Abdominal distension EXAM: ABDOMEN - 2 VIEW COMPARISON:  None. FINDINGS: Visualized bowel gas pattern is nonobstructive. No evidence of soft tissue mass or abnormal fluid collection. No evidence of free intraperitoneal air. No evidence of renal or ureteral calculi. No acute appearing osseous abnormality. IMPRESSION: Nonobstructive bowel gas pattern. No evidence of acute intra-abdominal abnormality. Electronically Signed   By: Franki Cabot M.D.   On: 07/12/2019 11:24      Scheduled Meds: . [MAR Hold] allopurinol  100 mg Oral BID  . [MAR Hold] aspirin  81 mg Oral Daily  . [MAR Hold] atorvastatin  80 mg Oral QHS  . [MAR Hold] Chlorhexidine Gluconate Cloth  6 each Topical Q0600  . [MAR Hold] clopidogrel  75 mg Oral Daily  . [MAR Hold] darbepoetin (ARANESP) injection - DIALYSIS  200 mcg Intravenous Q Sat-HD  . [MAR Hold] diclofenac sodium  2 g Topical QID  . [MAR Hold]  gabapentin  300 mg Oral Daily  . heparin  5,000 Units Subcutaneous Q8H  . [MAR Hold] isosorbide mononitrate  120 mg Oral BID  . [MAR Hold] Living Better with Heart Failure Book   Does not apply Once  . nitroGLYCERIN      . [MAR Hold] pantoprazole  40 mg Oral Daily  . [MAR Hold] ranolazine  500 mg Oral BID  . [MAR Hold] sodium chloride flush  3 mL Intravenous Once  . [MAR Hold] sodium chloride flush  3 mL Intravenous Q12H  . sodium chloride flush  3 mL Intravenous Q12H  . traMADol       Continuous Infusions: . [MAR Hold] sodium chloride    . sodium chloride    . sodium chloride    . heparin 1,200 Units/hr (07/13/19 1015)  . nitroGLYCERIN    . [MAR Hold] nitroGLYCERIN 15 mcg/min (07/13/19 1145)     LOS: 3 days      Debbe Odea, MD [facility] Pager: [email or web address] Password [email or web address] 07/13/2019, 3:21 PM

## 2019-07-13 NOTE — Significant Event (Signed)
Rapid Response Event Note  Overview:  Called for pt just back from HD c/o CP with EKG changes per bedside RN.    Initial Focused Assessment: On arrival, pt laying in bed A&O, c/o CP of 10. Unable to describe just stating he has "pain". HR 89, BP 115/70, RR 25, spO2 89% on 2L [place]. Pt on Heparin gtt at  1200 u/hr  Interventions: EKG- prior to my arrival SL Nitro x3- prior to my arrival O2 increased to 4L IV team consult for 2nd line Nitro gtt   Plan of Care (if not transferred): To Cath Lab  Event Summary:  called at  1057   Event ended at  [address]

## 2019-07-13 NOTE — H&P (View-Only) (Signed)
Progress Note  Patient Name: Matthew BEAVERSON Sr. Date of Encounter: 07/13/2019  Primary Cardiologist: Larae Grooms, MD   Subjective   Currently in HD. He had a "very good night" without any chest discomfort and firmly refuses to undergo cardiac catheterization today.  He does not think he needs it. I tried calling his brother Jiles Prows, who is a Stage manager in [place], but was unable to reach him and left a voicemail.  Inpatient Medications    Scheduled Meds: . allopurinol  100 mg Oral BID  . aspirin  81 mg Oral Daily  . atorvastatin  80 mg Oral QHS  . Chlorhexidine Gluconate Cloth  6 each Topical Q0600  . clopidogrel  75 mg Oral Daily  . [START ON 07/18/2019] darbepoetin (ARANESP) injection - DIALYSIS  200 mcg Intravenous Q Sat-HD  . gabapentin  300 mg Oral Daily  . isosorbide mononitrate  120 mg Oral BID  . Living Better with Heart Failure Book   Does not apply Once  . pantoprazole  40 mg Oral Daily  . ranolazine  500 mg Oral BID  . sodium chloride flush  3 mL Intravenous Once  . sodium chloride flush  3 mL Intravenous Q12H   Continuous Infusions: . sodium chloride    . sodium chloride    . sodium chloride    . sodium chloride    . heparin 1,100 Units/hr (07/12/19 1000)   PRN Meds: sodium chloride, sodium chloride, sodium chloride, acetaminophen, alteplase, guaiFENesin-dextromethorphan, heparin, insulin aspart protamine- aspart, lidocaine (PF), lidocaine-prilocaine, nitroGLYCERIN, ondansetron (ZOFRAN) IV, oxymetazoline, pentafluoroprop-tetrafluoroeth, sodium chloride flush, traMADol, zolpidem   Vital Signs    Vitals:   07/13/19 0712 07/13/19 0723 07/13/19 0730 07/13/19 0800  BP: 129/73 130/74 133/75 (!) 173/50  Pulse: 79 79 79 81  Resp: (!) 22 (!) 23 (!) 25 (!) 26  Temp:      TempSrc: Oral     SpO2: 96% 99% 99% 100%  Weight: 111 kg     Height:        Intake/Output Summary (Last 24 hours) at 07/13/2019 0837 Last data filed at 07/13/2019 0520 Gross per 24  hour  Intake 1356.57 ml  Output 400 ml  Net 956.57 ml   Last 3 Weights 07/13/2019 07/13/2019 07/12/2019  Weight (lbs) 244 lb 11.4 oz 244 lb 14.4 oz 242 lb 14.4 oz  Weight (kg) 111 kg 111.086 kg 110.179 kg      Telemetry    NSR - Personally Reviewed  ECG    NSR, IVCD, no major ST-T changes - Personally Reviewed  Physical Exam  Obese GEN: No acute distress.   Neck: No JVD Cardiac: RRR, split S2, 2/6 early peaking systolic ejection murmur at aortic focus radiating to the carotids no diastolic murmurs, rubs, or gallops.  Respiratory: Clear to auscultation bilaterally. GI: Soft, nontender, non-distended  MS: No edema; No deformity. Neuro:  Nonfocal  Psych: Normal affect   Labs    High Sensitivity Troponin:   Recent Labs  Lab 07/10/19 0918 07/10/19 1118  TROPONINIHS 26* 130*      Chemistry Recent Labs  Lab 07/12/19 0414 07/12/19 1301 07/13/19 0442  NA 135 132* 133*  K 4.1 3.7 4.0  CL 100 99 98  CO2 22 21* 19*  GLUCOSE 96 95 95  BUN 58* 61* 63*  CREATININE 5.99* 6.12* 6.16*  CALCIUM 8.9 8.8* 8.9  ALBUMIN  --  3.5  --   GFRNONAA 9* 9* 9*  GFRAA 11* 11* 11*  ANIONGAP 13  12 16*     Hematology Recent Labs  Lab 07/11/19 0040 07/12/19 0414 07/12/19 1301  WBC 8.8 10.1 10.0  RBC 2.94* 2.94* 2.86*  HGB 8.5* 8.3* 8.3*  HCT 26.7* 26.4* 25.1*  MCV 90.8 89.8 87.8  MCH 28.9 28.2 29.0  MCHC 31.8 31.4 33.1  RDW 15.9* 15.9* 15.9*  PLT 185 205 183    BNP Recent Labs  Lab 07/10/19 0918  BNP 1,468.8*     DDimer No results for input(s): DDIMER in the last 168 hours.   Radiology    Dg Abd 2 Views  Result Date: 07/12/2019 CLINICAL DATA:  Abdominal distension EXAM: ABDOMEN - 2 VIEW COMPARISON:  None. FINDINGS: Visualized bowel gas pattern is nonobstructive. No evidence of soft tissue mass or abnormal fluid collection. No evidence of free intraperitoneal air. No evidence of renal or ureteral calculi. No acute appearing osseous abnormality. IMPRESSION:  Nonobstructive bowel gas pattern. No evidence of acute intra-abdominal abnormality. Electronically Signed   By: Franki Cabot M.D.   On: 07/12/2019 11:24    Cardiac Studies   ECHO 07/12/2019:   1. Left ventricular ejection fraction, by visual estimation, is 35 to 40%. The left ventricle has moderate to severely decreased function. There is no left ventricular hypertrophy.  2. Mid inferoseptal segment and mid inferior segment are abnormal.  3. Definity contrast agent was given IV to delineate the left ventricular endocardial borders.  4. Abnormal septal motion consistent with left bundle branch block.  5. Left ventricular diastolic parameters are consistent with Grade II diastolic dysfunction (pseudonormalization).  6. Global right ventricle has normal systolic function.The right ventricular size is moderately enlarged. No increase in right ventricular wall thickness.  7. Left atrial size was severely dilated.  8. Right atrial size was severely dilated.  9. The mitral valve is normal in structure. Mild mitral valve regurgitation. No evidence of mitral stenosis. 10. The tricuspid valve is normal in structure. Tricuspid valve regurgitation moderate-severe. 11. Aortic valve mean gradient measures 22.0 mmHg. 12. Aortic valve peak gradient measures 39.1 mmHg. 13. The aortic valve is normal in structure. Aortic valve regurgitation is not visualized. Moderate aortic valve stenosis. 14. There is Moderate calcification of the aortic valve. 15. There is Moderate thickening of the aortic valve. 16. The pulmonic valve was normal in structure. Pulmonic valve regurgitation is trivial. 17. Mildly elevated pulmonary artery systolic pressure. 18. The inferior vena cava is normal in size with greater than 50% respiratory variability, suggesting right atrial pressure of 3 mmHg.  06/23/2018 CATH:   Mid LM to Dist LM lesion is 40% stenosed.  Ost LAD lesion is 70% stenosed.  Prox LAD lesion is 80%  stenosed.  Dist LAD lesion is 100% stenosed.  Mid RCA to Dist RCA lesion is 10% stenosed.  Dist RCA lesion is 50% stenosed.  Mid LAD lesion is 50% stenosed.  Prox LAD to Mid LAD lesion is 50% stenosed.  PA pressure 30/16 mm Hg; mean PA 22 mm Hg; PCWP 12/12 PCWP 12 mm Hg; Ao sat 92%, PA sat 66 %; CO 8.1 L/min; CI 3.8   Severe LAD disease.  It appears that the ostial LAD disease has worsened since the last cath and the mid LAD has also significantly worsened.  THe mid LAd may be the culprit for his presentation.    Will discuss with colleagues, plan for PCI.  Distal left main trifurcation with significant ostial LAD disease.  Patient Profile     58 y.o. 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 Last cath 1 year ago: "showing complex LAD disease.  Given that his anterior wall was thought to be partially nonviable due to prior infarction, and treating the LAD stenosis would [jail] a large ramus territory which was viable, medical therapy was opted for. "  Assessment & Plan    1. CAD w NSTEMI: complex coronary history a. INF MI 1999: PCI of RCA   b.  extensive stenting of LAD;   c. 2006 s/p CABG with RIMA->RCA  d. 05/2012 Stable, Low risk MV ;  e. 09/2012 Cath: stable anatomy->med Rx.  f. 11/2013 Abnl nuc  -> med rx;  g. 11/2013 Cath/PCI: s/p DES to RCA.  h. 11/2016, Abnormal nuc,  pt declined cath.  i.  06/2018 complex LAD disease, medical rx Recommend cath today. He has been reluctant, but is familiar w Dr. Saunders Revel.  He initially agreed to undergo angiography, but now does not want to have it done.  He wants to be discharged from the hospital and discuss it with his physicians at the [facility], before deciding whether or not to have cardiac catheterization.  I reviewed the distinction between stable and unstable  angina with him and explained that any prolonged episode of chest pain should be investigated promptly to avoid permanent myocardial damage. On ASA and clopidogrel. 2. ESRD: Newly declared end-stage renal, starting hemodialysis.  We will follow him while he is in the hospital. 3. AS: He does not have any exertional symptoms.     For questions or updates, please contact [place] Please consult [email or web address] for contact info under        Signed, Sanda Klein, MD  07/13/2019, 8:37 AM

## 2019-07-13 NOTE — Progress Notes (Signed)
ANTICOAGULATION CONSULT NOTE - Initial Consult  Pharmacy Consult for IV Heparin Indication: chest pain/ACS  Allergies  Allergen Reactions  . Cleocin [Clindamycin Hcl] Anaphylaxis and Swelling  . Glimepiride [Amaryl] Other (See Comments)    Elevates liver function   . Clarithromycin Itching and Other (See Comments)    "Biaxin" Eyes itch and burn  . Colchicine Other (See Comments)    Affected kidneys   . Ciprocin-Fluocin-Procin [Fluocinolone Acetonide] Rash    Patient Measurements: Height: 5' 5.5" (166.4 cm) Weight: 244 lb 11.4 oz (111 kg)(standing weight) IBW/kg (Calculated) : 62.65 Heparin Dosing Weight: 86.3 kg  Vital Signs: Temp: 98.8 F (37.1 C) (11/02 0421) Temp Source: Oral (11/02 0712) BP: 137/97 (11/02 0830) Pulse Rate: 85 (11/02 0830)  Labs: Recent Labs    07/10/19 1118  07/11/19 0040 07/11/19 1046 07/12/19 0414 07/12/19 1301 07/13/19 0442  HGB  --    < > 8.5*  --  8.3* 8.3*  --   HCT  --   --  26.7*  --  26.4* 25.1*  --   PLT  --   --  185  --  205 183  --   HEPARINUNFRC  --    < > 0.51 0.41 0.43  --  0.26*  CREATININE  --    < > 6.74*  --  5.99* 6.12* 6.16*  TROPONINIHS 130*  --   --   --   --   --   --    < > = values in this interval not displayed.    Estimated Creatinine Clearance: 15 mL/min (A) (by C-G formula based on SCr of 6.16 mg/dL (H)).   Medical History: Past Medical History:  Diagnosis Date  . Acute on chronic combined systolic and diastolic CHF (congestive heart failure) ([place]) 10/15/2017  . Chronic anemia   . Chronic combined systolic and diastolic CHF (congestive heart failure) ([place])   . Chronic stable angina ([place])   . CKD (chronic kidney disease), stage IV ([place])   . Coronary artery disease    a. INF MI 1999: PCI of RCA;  b.  extensive stenting of LAD;  c. s/p CABG with RIMA->RCA in 2006;  d. Stable, Low risk MV 05/2012; e. 09/2012 Cath: stable anatomy->med Rx. f. Abnl nuc 11/2013 -> med rx; g. 11/2013 Cath/PCI: s/p DES to RCA. h.  Abnormal nuc 11/2016, pt declined cath.   . DM2 (diabetes mellitus, type 2) ([facility])    Type II  . Dyslipidemia   . GERD (gastroesophageal reflux disease)   . History of gout   . Hypertension   . Hypertensive heart disease   . Ischemic cardiomyopathy   . Mild aortic stenosis   . Myocardial infarction [facility]) 1999; 2002; 2003; 2006; ~ 2008  . OSA on CPAP    wears CPAP  . Polycystic kidney disease     Medications:  No anticoagulation prior to admission  Assessment: 58 year old male who presented to the ED for chest pain. Pharmacy consulted for IV Heparin dosing for ACS.   Hgb low but stable at 8.3. Platelets 205. Heparin level is slightly  subtherapeutic at 0.26. No bleeding noted per RN. Patient's stage IV CKD has worsened and will transition to IHD per nephrology.  Goal of Therapy:  Heparin level 0.3-0.7 units/ml Monitor platelets by anticoagulation protocol: Yes   Plan:  Increase heparin infusion to 1200 units/hr Check anti-Xa level daily while on heparin Continue to monitor H&H and platelets Watch for signs/symptoms of bleeding  Amori Cooperman A.  Levada Dy, PharmD, BCPS, FNKF Clinical Pharmacist [place] Please utilize Amion for appropriate phone number to reach the unit pharmacist ([place])   07/13/2019,9:27 AM

## 2019-07-13 NOTE — Progress Notes (Signed)
Progress Note  Patient Name: Matthew Rowe. Date of Encounter: 07/13/2019  Primary Cardiologist: Larae Grooms, MD   Subjective   Currently in HD. He had a "very good night" without any chest discomfort and firmly refuses to undergo cardiac catheterization today.  He does not think he needs it. I tried calling his brother Jiles Prows, who is a Stage manager in [place], but was unable to reach him and left a voicemail.  Inpatient Medications    Scheduled Meds: . allopurinol  100 mg Oral BID  . aspirin  81 mg Oral Daily  . atorvastatin  80 mg Oral QHS  . Chlorhexidine Gluconate Cloth  6 each Topical Q0600  . clopidogrel  75 mg Oral Daily  . [START ON 07/18/2019] darbepoetin (ARANESP) injection - DIALYSIS  200 mcg Intravenous Q Sat-HD  . gabapentin  300 mg Oral Daily  . isosorbide mononitrate  120 mg Oral BID  . Living Better with Heart Failure Book   Does not apply Once  . pantoprazole  40 mg Oral Daily  . ranolazine  500 mg Oral BID  . sodium chloride flush  3 mL Intravenous Once  . sodium chloride flush  3 mL Intravenous Q12H   Continuous Infusions: . sodium chloride    . sodium chloride    . sodium chloride    . sodium chloride    . heparin 1,100 Units/hr (07/12/19 1000)   PRN Meds: sodium chloride, sodium chloride, sodium chloride, acetaminophen, alteplase, guaiFENesin-dextromethorphan, heparin, insulin aspart protamine- aspart, lidocaine (PF), lidocaine-prilocaine, nitroGLYCERIN, ondansetron (ZOFRAN) IV, oxymetazoline, pentafluoroprop-tetrafluoroeth, sodium chloride flush, traMADol, zolpidem   Vital Signs    Vitals:   07/13/19 0712 07/13/19 0723 07/13/19 0730 07/13/19 0800  BP: 129/73 130/74 133/75 (!) 173/50  Pulse: 79 79 79 81  Resp: (!) 22 (!) 23 (!) 25 (!) 26  Temp:      TempSrc: Oral     SpO2: 96% 99% 99% 100%  Weight: 111 kg     Height:        Intake/Output Summary (Last 24 hours) at 07/13/2019 0837 Last data filed at 07/13/2019 0520 Gross per 24  hour  Intake 1356.57 ml  Output 400 ml  Net 956.57 ml   Last 3 Weights 07/13/2019 07/13/2019 07/12/2019  Weight (lbs) 244 lb 11.4 oz 244 lb 14.4 oz 242 lb 14.4 oz  Weight (kg) 111 kg 111.086 kg 110.179 kg      Telemetry    NSR - Personally Reviewed  ECG    NSR, IVCD, no major ST-T changes - Personally Reviewed  Physical Exam  Obese GEN: No acute distress.   Neck: No JVD Cardiac: RRR, split S2, 2/6 early peaking systolic ejection murmur at aortic focus radiating to the carotids no diastolic murmurs, rubs, or gallops.  Respiratory: Clear to auscultation bilaterally. GI: Soft, nontender, non-distended  MS: No edema; No deformity. Neuro:  Nonfocal  Psych: Normal affect   Labs    High Sensitivity Troponin:   Recent Labs  Lab 07/10/19 0918 07/10/19 1118  TROPONINIHS 26* 130*      Chemistry Recent Labs  Lab 07/12/19 0414 07/12/19 1301 07/13/19 0442  NA 135 132* 133*  K 4.1 3.7 4.0  CL 100 99 98  CO2 22 21* 19*  GLUCOSE 96 95 95  BUN 58* 61* 63*  CREATININE 5.99* 6.12* 6.16*  CALCIUM 8.9 8.8* 8.9  ALBUMIN  --  3.5  --   GFRNONAA 9* 9* 9*  GFRAA 11* 11* 11*  ANIONGAP 13  12 16*     Hematology Recent Labs  Lab 07/11/19 0040 07/12/19 0414 07/12/19 1301  WBC 8.8 10.1 10.0  RBC 2.94* 2.94* 2.86*  HGB 8.5* 8.3* 8.3*  HCT 26.7* 26.4* 25.1*  MCV 90.8 89.8 87.8  MCH 28.9 28.2 29.0  MCHC 31.8 31.4 33.1  RDW 15.9* 15.9* 15.9*  PLT 185 205 183    BNP Recent Labs  Lab 07/10/19 0918  BNP 1,468.8*     DDimer No results for input(s): DDIMER in the last 168 hours.   Radiology    Dg Abd 2 Views  Result Date: 07/12/2019 CLINICAL DATA:  Abdominal distension EXAM: ABDOMEN - 2 VIEW COMPARISON:  None. FINDINGS: Visualized bowel gas pattern is nonobstructive. No evidence of soft tissue mass or abnormal fluid collection. No evidence of free intraperitoneal air. No evidence of renal or ureteral calculi. No acute appearing osseous abnormality. IMPRESSION:  Nonobstructive bowel gas pattern. No evidence of acute intra-abdominal abnormality. Electronically Signed   By: Franki Cabot M.D.   On: 07/12/2019 11:24    Cardiac Studies   ECHO 07/12/2019:   1. Left ventricular ejection fraction, by visual estimation, is 35 to 40%. The left ventricle has moderate to severely decreased function. There is no left ventricular hypertrophy.  2. Mid inferoseptal segment and mid inferior segment are abnormal.  3. Definity contrast agent was given IV to delineate the left ventricular endocardial borders.  4. Abnormal septal motion consistent with left bundle branch block.  5. Left ventricular diastolic parameters are consistent with Grade II diastolic dysfunction (pseudonormalization).  6. Global right ventricle has normal systolic function.The right ventricular size is moderately enlarged. No increase in right ventricular wall thickness.  7. Left atrial size was severely dilated.  8. Right atrial size was severely dilated.  9. The mitral valve is normal in structure. Mild mitral valve regurgitation. No evidence of mitral stenosis. 10. The tricuspid valve is normal in structure. Tricuspid valve regurgitation moderate-severe. 11. Aortic valve mean gradient measures 22.0 mmHg. 12. Aortic valve peak gradient measures 39.1 mmHg. 13. The aortic valve is normal in structure. Aortic valve regurgitation is not visualized. Moderate aortic valve stenosis. 14. There is Moderate calcification of the aortic valve. 15. There is Moderate thickening of the aortic valve. 16. The pulmonic valve was normal in structure. Pulmonic valve regurgitation is trivial. 17. Mildly elevated pulmonary artery systolic pressure. 18. The inferior vena cava is normal in size with greater than 50% respiratory variability, suggesting right atrial pressure of 3 mmHg.  06/23/2018 CATH:   Mid LM to Dist LM lesion is 40% stenosed.  Ost LAD lesion is 70% stenosed.  Prox LAD lesion is 80%  stenosed.  Dist LAD lesion is 100% stenosed.  Mid RCA to Dist RCA lesion is 10% stenosed.  Dist RCA lesion is 50% stenosed.  Mid LAD lesion is 50% stenosed.  Prox LAD to Mid LAD lesion is 50% stenosed.  PA pressure 30/16 mm Hg; mean PA 22 mm Hg; PCWP 12/12 PCWP 12 mm Hg; Ao sat 92%, PA sat 66 %; CO 8.1 L/min; CI 3.8   Severe LAD disease.  It appears that the ostial LAD disease has worsened since the last cath and the mid LAD has also significantly worsened.  THe mid LAd may be the culprit for his presentation.    Will discuss with colleagues, plan for PCI.  Distal left main trifurcation with significant ostial LAD disease.  Patient Profile     58 y.o. 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 Last cath 1 year ago: "showing complex LAD disease.  Given that his anterior wall was thought to be partially nonviable due to prior infarction, and treating the LAD stenosis would [jail] a large ramus territory which was viable, medical therapy was opted for. "  Assessment & Plan    1. CAD w NSTEMI: complex coronary history a. INF MI 1999: PCI of RCA   b.  extensive stenting of LAD;   c. 2006 s/p CABG with RIMA->RCA  d. 05/2012 Stable, Low risk MV ;  e. 09/2012 Cath: stable anatomy->med Rx.  f. 11/2013 Abnl nuc  -> med rx;  g. 11/2013 Cath/PCI: s/p DES to RCA.  h. 11/2016, Abnormal nuc,  pt declined cath.  i.  06/2018 complex LAD disease, medical rx Recommend cath today. He has been reluctant, but is familiar w Dr. Saunders Revel.  He initially agreed to undergo angiography, but now does not want to have it done.  He wants to be discharged from the hospital and discuss it with his physicians at the [facility], before deciding whether or not to have cardiac catheterization.  I reviewed the distinction between stable and unstable  angina with him and explained that any prolonged episode of chest pain should be investigated promptly to avoid permanent myocardial damage. On ASA and clopidogrel. 2. ESRD: Newly declared end-stage renal, starting hemodialysis.  We will follow him while he is in the hospital. 3. AS: He does not have any exertional symptoms.     For questions or updates, please contact [place] Please consult [email or web address] for contact info under        Signed, Sanda Klein, MD  07/13/2019, 8:37 AM

## 2019-07-13 NOTE — Brief Op Note (Signed)
07/13/2019  1:36 PM  PATIENT:  Matthew Kirks Sr.  58 y.o. male  PRE-OPERATIVE DIAGNOSIS:  NSTEMI  POST-OPERATIVE DIAGNOSIS:  Same  PROCEDURE:  Procedure(s): LEFT HEART CATH AND CORONARY ANGIOGRAPHY (N/A)  SURGEON:  Surgeon(s) and Role:    Nelva Bush, MD - Primary  FINDINGS: 1. Severe diffuse LAD disease with up to 80% ostial and 90% mid disease.  Distal LAD stent remains occluded. 2. 50-60% distal LMCA stenosis. 3. 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  There is severe diffuse disease involving the rPDA and its branches. 4. Moderately to severely elevated left ventricular filling pressure (LVEDP 30-35 mmHg).  RECOMMENDATIONS: 1. Aggressive medical therapy including fluid removal with diuretics and dialysis.  Nelva Bush, MD [facility] Pager: [phone]

## 2019-07-13 NOTE — Progress Notes (Addendum)
[facility] ROUNDING NOTE   Subjective:   This is a 58 year old gentleman past medical history of combined systolic diastolic heart failure, CABG,DMII, OSA using CPAP followed by cardiology ans admitted with NSTEMI.  He has a history of chronic anemia, CKD stage IV/V 2/2 diabetic nephropathy. Had poor response to diuresis. Underwent successful initial dialysis treatment 07/11/2019 with removal of 2L.  He has an AV fistula in his left upper arm that is accessible and has a good thrill and bruit.  He will need clip process started 07/13/2019.  Vitals stable  Sodium 133 potassium 4.0 chloride 98 CO2 19 BUN 63 creatinine 6.16 glucose 95  Yest: WBC 10.0 hemoglobin 8.3 platelets 183 PTH 211, Ca++ 8.8, Phos 4.2 Net I/O since admission: -1,204.62mL   Allopurinol 100 mg twice daily amlodipine 10 mg daily aspirin 81 mg day Lipitor 80 mg daily Coreg 50 mg twice daily Plavix 25 mg daily darbepoetin 200 mcg every Saturday, gabapentin 300 mg daily hydralazine 50 mg 3 times daily, Imdur 120 mg twice daily, Protonix 40 mg daily Ranexa 500 mg twice daily  Cardiology planned heart cath today but patient declined at this time and would like a second opinion from the [place]. Patient informed cardiology this am and after discussion, began experiencing some typical chest pain which he attributes to being emotionally upset. He is currently receiving dialysis and 100% O2 on room air (Reeder is out of nose completely). Lasting approximately 5 minutes and is current during interview. Described as same as his usual angina and not nearly as bad as when he first arrived. Patient typically takes nitroglycerin at home for chest pain. He continues on heparin drip. I have paged cardiology for further management- waiting to hear back.  Objective:  Vital signs in last 24 hours:  Temp:  [97.6 F (36.4 C)-98.8 F (37.1 C)] 98.8 F (37.1 C) (11/02 0421) Pulse Rate:  [79-82] 79 (11/02 0730) Resp:  [16-25] 25 (11/02 0730) BP:  (107-133)/(56-75) 133/75 (11/02 0730) SpO2:  [95 %-99 %] 99 % (11/02 0730) Weight:  [111 kg-111.1 kg] 111 kg (11/02 0712)  Weight change: -4.914 kg Filed Weights   07/12/19 0500 07/13/19 0421 07/13/19 0712  Weight: 110.2 kg 111.1 kg 111 kg   Intake/Output: I/O last 3 completed shifts: In: 1576.6 [P.O.:1320; I.V.:256.6] Out: 640 [Urine:640]   Physical exam: General- Alert, nondistressed CVS- RRR 3/6 systolic murmur RS- CTA no wheezes or rales ABD-soft, non-tender EXT- trace edema AV fistula left arm hooked up to dialysis  Basic Metabolic Panel: Recent Labs  Lab 07/10/19 0918 07/11/19 0040 07/12/19 0414 07/12/19 1301 07/13/19 0442  NA 134* 133* 135 132* 133*  K 3.7 4.1 4.1 3.7 4.0  CL 100 98 100 99 98  CO2 19* 21* 22 21* 19*  GLUCOSE 215* 165* 96 95 95  BUN 67* 69* 58* 61* 63*  CREATININE 6.26* 6.74* 5.99* 6.12* 6.16*  CALCIUM 8.9 8.7* 8.9 8.8* 8.9  PHOS  --   --   --  4.2  --    Liver Function Tests: Recent Labs  Lab 07/12/19 1301  ALBUMIN 3.5   CBC: Recent Labs  Lab 07/10/19 0918 07/11/19 0040 07/12/19 0414 07/12/19 1301  WBC 7.6 8.8 10.1 10.0  NEUTROABS  --  6.9  --   --   HGB 8.1* 8.5* 8.3* 8.3*  HCT 25.5* 26.7* 26.4* 25.1*  MCV 88.5 90.8 89.8 87.8  PLT 189 185 205 183   CBG: Recent Labs  Lab 07/11/19 2118 07/12/19 0755 07/12/19  1243 07/12/19 1645 07/12/19 2107  GLUCAP 127* 92 93 161* 145*   Imaging: Dg Abd 2 Views  Result Date: 07/12/2019 CLINICAL DATA:  Abdominal distension EXAM: ABDOMEN - 2 VIEW COMPARISON:  None. FINDINGS: Visualized bowel gas pattern is nonobstructive. No evidence of soft tissue mass or abnormal fluid collection. No evidence of free intraperitoneal air. No evidence of renal or ureteral calculi. No acute appearing osseous abnormality. IMPRESSION: Nonobstructive bowel gas pattern. No evidence of acute intra-abdominal abnormality. Electronically Signed   By: Franki Cabot M.D.   On: 07/12/2019 11:24   Medications:   .  sodium chloride    . sodium chloride    . sodium chloride    . sodium chloride    . heparin 1,100 Units/hr (07/12/19 1000)   . allopurinol  100 mg Oral BID  . aspirin  81 mg Oral Daily  . atorvastatin  80 mg Oral QHS  . Chlorhexidine Gluconate Cloth  6 each Topical Q0600  . clopidogrel  75 mg Oral Daily  . [START ON 07/18/2019] darbepoetin (ARANESP) injection - DIALYSIS  200 mcg Intravenous Q Sat-HD  . gabapentin  300 mg Oral Daily  . isosorbide mononitrate  120 mg Oral BID  . Living Better with Heart Failure Book   Does not apply Once  . pantoprazole  40 mg Oral Daily  . ranolazine  500 mg Oral BID  . sodium chloride flush  3 mL Intravenous Once  . sodium chloride flush  3 mL Intravenous Q12H   sodium chloride, sodium chloride, sodium chloride, acetaminophen, alteplase, guaiFENesin-dextromethorphan, heparin, insulin aspart protamine- aspart, lidocaine (PF), lidocaine-prilocaine, nitroGLYCERIN, ondansetron (ZOFRAN) IV, oxymetazoline, pentafluoroprop-tetrafluoroeth, sodium chloride flush, traMADol, zolpidem  Assessment/ Plan:  1. Now ESRD.  Patient scheduled for dialysis today- 07/13/2019 his first dialysis treatment was 07/11/2019 which he tolerated well. Renal function panel daily to monitor Ca, phos, other 2.  Anemia T sats 21% darbepoetin 200 mics weekly 3.  Bones- PTH 211. Continue to follow calcium and phosphorus 4.  Abdominal distention- xray neg for obstruction. Follow I/O, LBM 10/29, suggest scheduled bowel regimen 5.  Diabetes mellitus as per primary team 6.  Coronary artery disease with history of CABG last cardiac cath 06/2018 continue aspirin Plavix and statin 7.  NSTEMI- continues on Heparin drip, echo with reduced EF 35-40% and mod-severe LV decreased function. continue aspirin Plavix beta-blocker Ranexa. Planned to proceed with cardiac catheterization today, 07/13/2019. Patient declined 8.  Hypertension/volume blood pressure a little low discontinued antihypertensives,  consider slowly adding back on if elevates s/p dialysis initiation and stabilization.  9.  Obstructive sleep apnea requiring CPAP  Discharge at this time likely pending CLIP process unless patient changes decision about cardiac workup.    LOS: [place]  7:39 AM  07/13/2019

## 2019-07-13 NOTE — Interval H&P Note (Signed)
History and Physical Interval Note:  07/13/2019 11:46 AM  Matthew Kirks Sr.  has presented today for cardiac catheterization, with the diagnosis of unstable angina.  The various methods of treatment have been discussed with the patient and family. After consideration of risks, benefits and other options for treatment, the patient has consented to  Procedure(s): LEFT HEART CATH AND CORONARY ANGIOGRAPHY (N/A) as a surgical intervention.  The patient's history has been reviewed, patient examined, no change in status, stable for surgery.  I have reviewed the patient's chart and labs.  Questions were answered to the patient's satisfaction.    Cath Lab Visit (complete for each Cath Lab visit)  Clinical Evaluation Leading to the Procedure:   ACS: Yes.    Non-ACS:  N/A   Ezella Kell

## 2019-07-14 ENCOUNTER — Encounter (HOSPITAL_COMMUNITY): Payer: Self-pay | Admitting: Internal Medicine

## 2019-07-14 LAB — RENAL FUNCTION PANEL
Albumin: 3.2 g/dL — ABNORMAL LOW (ref 3.5–5.0)
Anion gap: 16 — ABNORMAL HIGH (ref 5–15)
BUN: 52 mg/dL — ABNORMAL HIGH (ref 6–20)
CO2: 23 mmol/L (ref 22–32)
Calcium: 9 mg/dL (ref 8.9–10.3)
Chloride: 95 mmol/L — ABNORMAL LOW (ref 98–111)
Creatinine, Ser: 5.42 mg/dL — ABNORMAL HIGH (ref 0.61–1.24)
GFR calc Af Amer: 12 mL/min — ABNORMAL LOW (ref >60)
GFR calc non Af Amer: 11 mL/min — ABNORMAL LOW (ref >60)
Glucose, Bld: 164 mg/dL — ABNORMAL HIGH (ref 70–99)
Phosphorus: 4.3 mg/dL (ref 2.5–4.6)
Potassium: 3.8 mmol/L (ref 3.5–5.1)
Sodium: 134 mmol/L — ABNORMAL LOW (ref 135–145)

## 2019-07-14 LAB — CBC
HCT: 25.1 % — ABNORMAL LOW (ref 39.0–52.0)
Hemoglobin: 7.9 g/dL — ABNORMAL LOW (ref 13.0–17.0)
MCH: 28.2 pg (ref 26.0–34.0)
MCHC: 31.5 g/dL (ref 30.0–36.0)
MCV: 89.6 fL (ref 80.0–100.0)
Platelets: 184 10*3/uL (ref 150–400)
RBC: 2.8 MIL/uL — ABNORMAL LOW (ref 4.22–5.81)
RDW: 16.6 % — ABNORMAL HIGH (ref 11.5–15.5)
WBC: 9 10*3/uL (ref 4.0–10.5)
nRBC: 1.2 % — ABNORMAL HIGH (ref 0.0–0.2)

## 2019-07-14 LAB — BASIC METABOLIC PANEL
Anion gap: 15 (ref 5–15)
BUN: 50 mg/dL — ABNORMAL HIGH (ref 6–20)
CO2: 23 mmol/L (ref 22–32)
Calcium: 9.2 mg/dL (ref 8.9–10.3)
Chloride: 96 mmol/L — ABNORMAL LOW (ref 98–111)
Creatinine, Ser: 5.41 mg/dL — ABNORMAL HIGH (ref 0.61–1.24)
GFR calc Af Amer: 12 mL/min — ABNORMAL LOW (ref >60)
GFR calc non Af Amer: 11 mL/min — ABNORMAL LOW (ref >60)
Glucose, Bld: 95 mg/dL (ref 70–99)
Potassium: 5.5 mmol/L — ABNORMAL HIGH (ref 3.5–5.1)
Sodium: 134 mmol/L — ABNORMAL LOW (ref 135–145)

## 2019-07-14 LAB — GLUCOSE, CAPILLARY
Glucose-Capillary: 99 mg/dL (ref 70–99)
Glucose-Capillary: 97 mg/dL (ref 70–99)
Glucose-Capillary: 129 mg/dL — ABNORMAL HIGH (ref 70–99)
Glucose-Capillary: 130 mg/dL — ABNORMAL HIGH (ref 70–99)

## 2019-07-14 MED ORDER — ALTEPLASE 2 MG IJ SOLR: 2.0000 mg | Freq: Once | INTRAMUSCULAR | Status: DC | PRN

## 2019-07-14 MED ORDER — LIDOCAINE-PRILOCAINE 2.5-2.5 % EX CREA
1.0000 "application " | TOPICAL_CREAM | CUTANEOUS | Status: DC | PRN
  Filled 2019-07-14: qty 5

## 2019-07-14 MED ORDER — SODIUM CHLORIDE 0.9 % IV SOLN: 100.0000 mL | INTRAVENOUS | Status: DC | PRN

## 2019-07-14 MED ORDER — SODIUM CHLORIDE 0.9 % IV SOLN
125.0000 mg | INTRAVENOUS | Status: DC
  Administered 2019-07-14 – 2019-07-16 (×2): 125 mg via INTRAVENOUS
  Filled 2019-07-14 (×2): qty 10

## 2019-07-14 MED ORDER — BISACODYL 5 MG PO TBEC
10.0000 mg | DELAYED_RELEASE_TABLET | Freq: Every day | ORAL | Status: DC | PRN
  Administered 2019-07-14: 10 mg via ORAL
  Filled 2019-07-14: qty 2

## 2019-07-14 MED ORDER — ISOSORBIDE MONONITRATE ER 60 MG PO TB24
60.0000 mg | ORAL_TABLET | Freq: Two times a day (BID) | ORAL | Status: DC
  Administered 2019-07-14 – 2019-07-16 (×4): 60 mg via ORAL
  Filled 2019-07-14 (×4): qty 1

## 2019-07-14 MED ORDER — LIDOCAINE HCL (PF) 1 % IJ SOLN: 5.0000 mL | INTRAMUSCULAR | Status: DC | PRN

## 2019-07-14 MED ORDER — HYDRALAZINE HCL 50 MG PO TABS
50.0000 mg | ORAL_TABLET | Freq: Three times a day (TID) | ORAL | Status: DC
  Administered 2019-07-14 (×3): 50 mg via ORAL
  Filled 2019-07-14 (×3): qty 1

## 2019-07-14 MED ORDER — AMLODIPINE BESYLATE 10 MG PO TABS
10.0000 mg | ORAL_TABLET | Freq: Every day | ORAL | Status: DC
  Administered 2019-07-14: 10 mg via ORAL
  Filled 2019-07-14: qty 1

## 2019-07-14 MED ORDER — SENNA 8.6 MG PO TABS: 1.0000 | ORAL_TABLET | Freq: Every day | ORAL | Status: DC | PRN

## 2019-07-14 MED ORDER — CARVEDILOL 25 MG PO TABS
25.0000 mg | ORAL_TABLET | Freq: Two times a day (BID) | ORAL | Status: DC
  Administered 2019-07-14 – 2019-07-16 (×4): 25 mg via ORAL
  Filled 2019-07-14 (×4): qty 1

## 2019-07-14 MED ORDER — PENTAFLUOROPROP-TETRAFLUOROETH EX AERO
1.0000 "application " | INHALATION_SPRAY | CUTANEOUS | Status: DC | PRN
  Filled 2019-07-14: qty 116

## 2019-07-14 MED ORDER — HEPARIN SODIUM (PORCINE) 1000 UNIT/ML DIALYSIS: 1000.0000 [IU] | INTRAMUSCULAR | Status: DC | PRN

## 2019-07-14 NOTE — Progress Notes (Signed)
PROGRESS NOTE    Matthew Rowe   [id number]  DOB: 11-01-1960  DOA: 07/10/2019 PCP: [name], MD   Brief Narrative:  Matthew Sipp. is a 58 y.o. male with medical history significant of combined systolic and diastolic CHF, CAD s/p CABG, chronic anemia, CKD stage IV, diabetes mellitus type 2, OSA on CPAP.  He presents for chest pain.  In ED>   High sensitivity troponin 26 >> 130, Cr ~ 6 Cardiology evaluated patient in ED, recommended IV Heparin and Nitro and asked for nephrology consult before pursuing a cath.  The patient was admitted by [facility].    Subjective: He had a BM yesterday but still feels constipated.   Assessment & Plan:   Principal Problem:   NSTEMI (non-ST elevated myocardial infarction)  - CAD s/p CABG and multiple PCIs - on ASA, Plavix, statin, Ranexa, Imdur and Heparin - per cardiology, initial plan for cath on 07/13/19 but I have told Dr Acie Fredrickson that the patient and his wife would like to postpone it.  - due to chest pain immediately after dialysis on 11/2,  the patient decided to pursue a cath which shows diffuse CAD- medical management recommended including removal for more fluid as EDP was elevated - on nitroglycerin infusion by cardiology being titrated for pain- awaiting further dialysis  - also has chest wall component to pain with tenderness- added Voltaren gel-  Active Problems: Chronic diastolic CHF - new finding of systolic CHF with EF of [id number] on ECHO from today- ECHO shows grade 2 d CHF as well - as he is starting dialysis, he should no longer need Lasix    Polycystic kidney disease- CKD 5 - Cr ~ 6-7 range, GRF 8 - he has an AV fistula already - dialysis started on 10/31-   Clip process to find and outpt dialysis spot has been started-    Anemia of chronic disease - Cardiology would like him to receive 1 U PRBC which he will receive with dialysis today    Abdominal distension - no pain- he has good bowel sounds and states he  has been having normal BMs - abdominal xray shows normal bowel gas pattern- ? If he has ascites- he claims he is constipated- will give Dulcolax suppository today  HTN - holding Amlodipine, Hydralazine, Coreg as BP is low and he has started dialysis     OSA on CPAP - CPAP ordered in hospital    Type 2 diabetes with nephropathy  - A1c 6.1 - he takes 20 U of 70/30 BID and Ozempic on Saturdays - 70/30 resumed by admitting doctor-  CBGs at his baseline    Gout - Allopurinal    Time spent in minutes: 35 DVT prophylaxis: Heparin infusion Code Status: Full code Family Communication:  Disposition Plan:   will need Clipping   Consultants:   Cardiology  nephrology  Procedures:  2 D ECHO 1. Left ventricular ejection fraction, by visual estimation, is 35 to 40%. The left ventricle has moderate to severely decreased function. There is no left ventricular hypertrophy.  2. Mid inferoseptal segment and mid inferior segment are abnormal.  3. Definity contrast agent was given IV to delineate the left ventricular endocardial borders.  4. Abnormal septal motion consistent with left bundle branch block.  5. Left ventricular diastolic parameters are consistent with Grade II diastolic dysfunction (pseudonormalization).  6. Global right ventricle has normal systolic function.The right ventricular size is moderately enlarged. No increase in right ventricular wall thickness.  7. Left atrial size was severely dilated.  8. Right atrial size was severely dilated.  9. The mitral valve is normal in structure. Mild mitral valve regurgitation. No evidence of mitral stenosis. 10. The tricuspid valve is normal in structure. Tricuspid valve regurgitation moderate-severe. 11. Aortic valve mean gradient measures 22.0 mmHg. 12. Aortic valve peak gradient measures 39.1 mmHg. 13. The aortic valve is normal in structure. Aortic valve regurgitation is not visualized. Moderate aortic valve stenosis. 14. There is  Moderate calcification of the aortic valve. 15. There is Moderate thickening of the aortic valve. 16. The pulmonic valve was normal in structure. Pulmonic valve regurgitation is trivial. 17. Mildly elevated pulmonary artery systolic pressure. 18. The inferior vena cava is normal in size with greater than 50% respiratory variability, suggesting right atrial pressure of 3 mmHg.  Antimicrobials:  Anti-infectives (From admission, onward)   None       Objective: Vitals:   07/14/19 0815 07/14/19 0828 07/14/19 0830 07/14/19 0845  BP: (!) 107/94 (!) 107/94 (!) 103/91 (!) 113/94  Pulse:  82    Resp: (!) 23  (!) 25 (!) 21  Temp:      TempSrc:      SpO2: 99%  100% 99%  Weight:      Height:        Intake/Output Summary (Last 24 hours) at 07/14/2019 1120 Last data filed at 07/14/2019 0800 Gross per 24 hour  Intake 370 ml  Output 250 ml  Net 120 ml   Filed Weights   07/12/19 0500 07/13/19 0421 07/13/19 0712  Weight: 110.2 kg 111.1 kg 111 kg    Examination: General exam: Appears comfortable  HEENT: PERRLA, oral mucosa moist, no sclera icterus or thrush Respiratory system: Clear to auscultation. Respiratory effort normal. Cardiovascular system: S1 & S2 heard,  No murmurs  Gastrointestinal system: Abdomen soft, non-tender, severely distended and tympanic. Normal bowel sounds   Central nervous system: Alert and oriented. No focal neurological deficits. Extremities: No cyanosis, clubbing or edema Skin: No rashes or ulcers Psychiatry:  Mood & affect appropriate.    Data Reviewed: I have personally reviewed following labs and imaging studies  CBC: Recent Labs  Lab 07/11/19 0040 07/12/19 0414 07/12/19 1301 07/13/19 1642 07/13/19 2246 07/14/19 0904  WBC 8.8 10.1 10.0 9.8  --  9.0  NEUTROABS 6.9  --   --   --   --   --   HGB 8.5* 8.3* 8.3* 7.6* 7.9* 7.9*  HCT 26.7* 26.4* 25.1* 23.8* 25.0* 25.1*  MCV 90.8 89.8 87.8 89.5  --  89.6  PLT 185 205 183 187  --  [id number]   Basic  Metabolic Panel: Recent Labs  Lab 07/12/19 0414 07/12/19 1301 07/13/19 0442 07/13/19 1642 07/14/19 0355 07/14/19 0904  NA 135 132* 133*  --  134* 134*  K 4.1 3.7 4.0  --  5.5* 3.8  CL 100 99 98  --  96* 95*  CO2 22 21* 19*  --  23 23  GLUCOSE 96 95 95  --  95 164*  BUN 58* 61* 63*  --  50* 52*  CREATININE 5.99* 6.12* 6.16* 4.66* 5.41* 5.42*  CALCIUM 8.9 8.8* 8.9  --  9.2 9.0  PHOS  --  4.2  --   --   --  4.3   GFR: Estimated Creatinine Clearance: 17 mL/min (A) (by C-G formula based on SCr of 5.42 mg/dL (H)). Liver Function Tests: Recent Labs  Lab 07/12/19 1301 07/14/19 0904  ALBUMIN 3.5 3.2*  No results for input(s): LIPASE, AMYLASE in the last 168 hours. No results for input(s): AMMONIA in the last 168 hours. Coagulation Profile: No results for input(s): INR, PROTIME in the last 168 hours. Cardiac Enzymes: No results for input(s): CKTOTAL, CKMB, CKMBINDEX, TROPONINI in the last 168 hours. BNP (last 3 results) No results for input(s): PROBNP in the last 8760 hours. HbA1C: No results for input(s): HGBA1C in the last 72 hours. CBG: Recent Labs  Lab 07/13/19 1345 07/13/19 1522 07/13/19 1849 07/13/19 2214 07/14/19 0709  GLUCAP 102* 99 145* 135* 99   Lipid Profile: No results for input(s): CHOL, HDL, LDLCALC, TRIG, CHOLHDL, LDLDIRECT in the last 72 hours. Thyroid Function Tests: No results for input(s): TSH, T4TOTAL, FREET4, T3FREE, THYROIDAB in the last 72 hours. Anemia Panel: No results for input(s): VITAMINB12, FOLATE, FERRITIN, TIBC, IRON, RETICCTPCT in the last 72 hours. Urine analysis:    Component Value Date/Time   COLORURINE STRAW (A) 06/20/2018 2033   APPEARANCEUR CLEAR 06/20/2018 2033   LABSPEC 1.006 06/20/2018 2033   PHURINE 6.0 06/20/2018 2033   GLUCOSEU NEGATIVE 06/20/2018 2033   HGBUR NEGATIVE 06/20/2018 2033   BILIRUBINUR NEGATIVE 06/20/2018 2033   [place] NEGATIVE 06/20/2018 2033   PROTEINUR NEGATIVE 06/20/2018 2033   UROBILINOGEN 1.0  07/17/2015 1425   NITRITE NEGATIVE 06/20/2018 2033   LEUKOCYTESUR NEGATIVE 06/20/2018 2033   Sepsis Labs: @LABRCNTIP (procalcitonin:4,lacticidven:4) ) Recent Results (from the past 240 hour(s))  SARS Coronavirus 2 by RT PCR (hospital order, performed in [facility] hospital lab) Nasopharyngeal Nasopharyngeal Swab     Status: None   Collection Time: 07/10/19  1:30 PM   Specimen: Nasopharyngeal Swab  Result Value Ref Range Status   SARS Coronavirus 2 NEGATIVE NEGATIVE Final    Comment: (NOTE) If result is NEGATIVE SARS-CoV-2 target nucleic acids are NOT DETECTED. The SARS-CoV-2 RNA is generally detectable in upper and lower  respiratory specimens during the acute phase of infection. The lowest  concentration of SARS-CoV-2 viral copies this assay can detect is 250  copies / mL. A negative result does not preclude SARS-CoV-2 infection  and should not be used as the sole basis for treatment or other  patient management decisions.  A negative result may occur with  improper specimen collection / handling, submission of specimen other  than nasopharyngeal swab, presence of viral mutation(s) within the  areas targeted by this assay, and inadequate number of viral copies  (<250 copies / mL). A negative result must be combined with clinical  observations, patient history, and epidemiological information. If result is POSITIVE SARS-CoV-2 target nucleic acids are DETECTED. The SARS-CoV-2 RNA is generally detectable in upper and lower  respiratory specimens dur ing the acute phase of infection.  Positive  results are indicative of active infection with SARS-CoV-2.  Clinical  correlation with patient history and other diagnostic information is  necessary to determine patient infection status.  Positive results do  not rule out bacterial infection or co-infection with other viruses. If result is PRESUMPTIVE POSTIVE SARS-CoV-2 nucleic acids MAY BE PRESENT.   A presumptive positive result was  obtained on the submitted specimen  and confirmed on repeat testing.  While 2019 novel coronavirus  (SARS-CoV-2) nucleic acids may be present in the submitted sample  additional confirmatory testing may be necessary for epidemiological  and / or clinical management purposes  to differentiate between  SARS-CoV-2 and other Sarbecovirus currently known to infect humans.  If clinically indicated additional testing with an alternate test  methodology [phone]) is advised. The  SARS-CoV-2 RNA is generally  detectable in upper and lower respiratory sp ecimens during the acute  phase of infection. The expected result is Negative. Fact Sheet for Patients:  StrictlyIdeas.no Fact Sheet for Healthcare Providers: BankingDealers.co.za This test is not yet approved or cleared by the [place] FDA and has been authorized for detection and/or diagnosis of SARS-CoV-2 by FDA under an Emergency Use Authorization (EUA).  This EUA will remain in effect (meaning this test can be used) for the duration of the COVID-19 declaration under Section 564(b)(1) of the Act, 21 U.S.C. section 360bbb-3(b)(1), unless the authorization is terminated or revoked sooner. Performed at [facility], [place] [address] [place], [place] [address]   MRSA PCR Screening     Status: None   Collection Time: 07/13/19  6:11 PM   Specimen: Nasopharyngeal  Result Value Ref Range Status   MRSA by PCR NEGATIVE NEGATIVE Final    Comment:        The GeneXpert MRSA Assay (FDA approved for NASAL specimens only), is one component of a comprehensive MRSA colonization surveillance program. It is not intended to diagnose MRSA infection nor to guide or monitor treatment for MRSA infections. Performed at [facility], [place] [address] [place],  [address]          Radiology Studies: No results found.    Scheduled Meds: . allopurinol  100 mg Oral BID  . amLODipine   10 mg Oral Daily  . aspirin  81 mg Oral Daily  . atorvastatin  80 mg Oral QHS  . carvedilol  25 mg Oral BID WC  . Chlorhexidine Gluconate Cloth  6 each Topical Q0600  . clopidogrel  75 mg Oral Daily  . [START ON 07/18/2019] darbepoetin (ARANESP) injection - DIALYSIS  200 mcg Intravenous Q Sat-HD  . diclofenac sodium  2 g Topical QID  . gabapentin  300 mg Oral Daily  . heparin  5,000 Units Subcutaneous Q8H  . hydrALAZINE  50 mg Oral Q8H  . isosorbide mononitrate  60 mg Oral BID  . Living Better with Heart Failure Book   Does not apply Once  . pantoprazole  40 mg Oral Daily  . ranolazine  500 mg Oral BID  . sodium chloride flush  3 mL Intravenous Once  . sodium chloride flush  3 mL Intravenous Q12H  . sodium chloride flush  3 mL Intravenous Q12H   Continuous Infusions: . sodium chloride    . sodium chloride    . sodium chloride    . sodium chloride    . ferric gluconate (FERRLECIT/NULECIT) IV    . nitroGLYCERIN 40 mcg/min (07/13/19 1637)     LOS: 4 days      Debbe Odea, MD [facility] Pager: [email or web address] Password [email or web address] 07/14/2019, 11:20 AM

## 2019-07-14 NOTE — Progress Notes (Signed)
Pt's hemodialysis treatment terminated early due to infiltration to A/V fistula.Dr. Carolin Sicks notified. Report given to patient's primary nurse, Faith. Ice applied, vital signs stable. Pt in no distress at this time.

## 2019-07-14 NOTE — Progress Notes (Signed)
Chaplain provided Initial Visit.  Matthew Rowe noted that he is a [employer].   Chaplain will follow-up if needed on other unit.

## 2019-07-14 NOTE — Progress Notes (Signed)
Progress Note  Patient Name: Matthew SVEUM Sr. Date of Encounter: 07/14/2019  Primary Cardiologist: Larae Grooms, MD   Subjective   No more angina overnight on intravenous nitroglycerin.  Denies shortness of breath. Angiography showed difficult coronary anatomy, not amenable to straightforward percutaneous revascularization.  Management discussed with 2 interventional cardiologists and preference was for further attempts at medical therapy. Note was made of severely elevated LVEDP at 35 mmHg at cardiac catheterization.  He is also anemic. He is requiring high doses of intravenous nitroglycerin for blood pressure control, but did not receive his carvedilol or amlodipine or hydralazine yet.  Inpatient Medications    Scheduled Meds: . allopurinol  100 mg Oral BID  . amLODipine  10 mg Oral Daily  . aspirin  81 mg Oral Daily  . atorvastatin  80 mg Oral QHS  . carvedilol  25 mg Oral BID WC  . Chlorhexidine Gluconate Cloth  6 each Topical Q0600  . clopidogrel  75 mg Oral Daily  . [START ON 07/18/2019] darbepoetin (ARANESP) injection - DIALYSIS  200 mcg Intravenous Q Sat-HD  . diclofenac sodium  2 g Topical QID  . gabapentin  300 mg Oral Daily  . heparin  5,000 Units Subcutaneous Q8H  . hydrALAZINE  50 mg Oral Q8H  . Living Better with Heart Failure Book   Does not apply Once  . pantoprazole  40 mg Oral Daily  . ranolazine  500 mg Oral BID  . sodium chloride flush  3 mL Intravenous Once  . sodium chloride flush  3 mL Intravenous Q12H  . sodium chloride flush  3 mL Intravenous Q12H   Continuous Infusions: . sodium chloride    . sodium chloride    . sodium chloride    . sodium chloride    . ferric gluconate (FERRLECIT/NULECIT) IV    . nitroGLYCERIN 40 mcg/min (07/13/19 1637)   PRN Meds: sodium chloride, sodium chloride, sodium chloride, sodium chloride, acetaminophen, alteplase, guaiFENesin-dextromethorphan, heparin, insulin aspart protamine- aspart, lidocaine (PF),  lidocaine-prilocaine, nitroGLYCERIN, ondansetron (ZOFRAN) IV, pentafluoroprop-tetrafluoroeth, sodium chloride flush, sodium chloride flush, traMADol, zolpidem   Vital Signs    Vitals:   07/14/19 0815 07/14/19 0828 07/14/19 0830 07/14/19 0845  BP: (!) 107/94 (!) 107/94 (!) 103/91 (!) 113/94  Pulse:  82    Resp: (!) 23  (!) 25 (!) 21  Temp:      TempSrc:      SpO2: 99%  100% 99%  Weight:      Height:        Intake/Output Summary (Last 24 hours) at 07/14/2019 0918 Last data filed at 07/14/2019 0800 Gross per 24 hour  Intake 370 ml  Output 2750 ml  Net -2380 ml   Last 3 Weights 07/13/2019 07/13/2019 07/12/2019  Weight (lbs) 244 lb 11.4 oz 244 lb 14.4 oz 242 lb 14.4 oz  Weight (kg) 111 kg 111.086 kg 110.179 kg      Telemetry    Sinus rhythm with occasional PVCs- Personally Reviewed  ECG    Sinus rhythm, atypical left bundle branch block- Personally Reviewed  Physical Exam  Obese, appears comfortable GEN: No acute distress.   Neck: No JVD Cardiac: RRR, toxically split second heart sound, 2/6 early peaking systolic ejection murmur in the aortic focus, radiating to the neck; no diastolic murmurs, rubs, or gallops.  No problems with bleeding or hematoma at right groin access site. Respiratory: Clear to auscultation bilaterally. GI: Soft, nontender, non-distended  MS: No edema; No deformity. Neuro:  Nonfocal  Psych: Normal affect   Labs    High Sensitivity Troponin:   Recent Labs  Lab 07/10/19 0918 07/10/19 1118  TROPONINIHS 26* 130*      Chemistry Recent Labs  Lab 07/12/19 1301 07/13/19 0442 07/13/19 1642 07/14/19 0355  NA 132* 133*  --  134*  K 3.7 4.0  --  5.5*  CL 99 98  --  96*  CO2 21* 19*  --  23  GLUCOSE 95 95  --  95  BUN 61* 63*  --  50*  CREATININE 6.12* 6.16* 4.66* 5.41*  CALCIUM 8.8* 8.9  --  9.2  ALBUMIN 3.5  --   --   --   GFRNONAA 9* 9* 13* 11*  GFRAA 11* 11* 15* 12*  ANIONGAP 12 16*  --  15     Hematology Recent Labs  Lab 07/12/19  0414 07/12/19 1301 07/13/19 1642 07/13/19 2246  WBC 10.1 10.0 9.8  --   RBC 2.94* 2.86* 2.66*  --   HGB 8.3* 8.3* 7.6* 7.9*  HCT 26.4* 25.1* 23.8* 25.0*  MCV 89.8 87.8 89.5  --   MCH 28.2 29.0 28.6  --   MCHC 31.4 33.1 31.9  --   RDW 15.9* 15.9* 16.2*  --   PLT 205 183 187  --     BNP Recent Labs  Lab 07/10/19 0918  BNP 1,468.8*     DDimer No results for input(s): DDIMER in the last 168 hours.   Radiology    Dg Abd 2 Views  Result Date: 07/12/2019 CLINICAL DATA:  Abdominal distension EXAM: ABDOMEN - 2 VIEW COMPARISON:  None. FINDINGS: Visualized bowel gas pattern is nonobstructive. No evidence of soft tissue mass or abnormal fluid collection. No evidence of free intraperitoneal air. No evidence of renal or ureteral calculi. No acute appearing osseous abnormality. IMPRESSION: Nonobstructive bowel gas pattern. No evidence of acute intra-abdominal abnormality. Electronically Signed   By: Franki Cabot M.D.   On: 07/12/2019 11:24    Cardiac Studies   07/13/2019 cardiac catheterization: Conclusions: 1. Severe native coronary artery disease, as detailed below.  Ostial LAD disease appears stable compared with prior catheterization a year ago.  Severe mid/distal LAD disease may be slightly worse but does not supply a significant territory.  Distal LMCA, ramus, and LCx disease has not changed significantly since 06/2018. 2. Patent RIMA-RCA.  There appears to be at least 50% stenosis of the mid RCA beyond the RIMA anastomosis, though evaluation is limited by competitive flow.  Mid/distal RCA stent is patent but may have moderate in-stent restenosis.  Distal RCA branches are small and diffusely diseased. 3. Moderately to severely elevated left ventricular filling pressure (LVEDP 30-35 mmHg).  Recommendations: 1. Aggressive medical therapy, including titration of nitroglycerin infusion for relief of chest pain. 2. Continue fluid removal via hemodialysis and diuresis, as elevated LVEDP  is likely contributing to angina. 3. Consider PRBC transfusion in the setting of gradually worsening chronic anemia; hemoglobin is down to 7.6 this afternoon.  Recommend targeting hemoglobin greater than eight and resolution of symptoms. 4. I would reserve PCI to the mid RCA for refractory symptoms.  I do not believe that intervention on the LAD would offer much improvement in symptoms given prior infarct in this territory and diffuse nature of disease.   07/12/2019 echo:  1. Left ventricular ejection fraction, by visual estimation, is 35 to 40%. The left ventricle has moderate to severely decreased function. There is no left ventricular hypertrophy.  2. Mid inferoseptal segment  and mid inferior segment are abnormal.  3. Definity contrast agent was given IV to delineate the left ventricular endocardial borders.  4. Abnormal septal motion consistent with left bundle branch block.  5. Left ventricular diastolic parameters are consistent with Grade II diastolic dysfunction (pseudonormalization).  6. Global right ventricle has normal systolic function.The right ventricular size is moderately enlarged. No increase in right ventricular wall thickness.  7. Left atrial size was severely dilated.  8. Right atrial size was severely dilated.  9. The mitral valve is normal in structure. Mild mitral valve regurgitation. No evidence of mitral stenosis. 10. The tricuspid valve is normal in structure. Tricuspid valve regurgitation moderate-severe. 11. Aortic valve mean gradient measures 22.0 mmHg. 12. Aortic valve peak gradient measures 39.1 mmHg. 13. The aortic valve is normal in structure. Aortic valve regurgitation is not visualized. Moderate aortic valve stenosis. 14. There is Moderate calcification of the aortic valve. 15. There is Moderate thickening of the aortic valve. 16. The pulmonic valve was normal in structure. Pulmonic valve regurgitation is trivial. 17. Mildly elevated pulmonary artery systolic  pressure. 18. The inferior vena cava is normal in size with greater than 50% respiratory variability, suggesting right atrial pressure of 3 mmHg.  06/21/2018 echo: - Left ventricle: The cavity size was normal. The estimated   ejection fraction was in the range of 55% to 60%. Hypokinesis of   the apical inferior, mid-apical inferoseptal, and mid-apical   anteroseptal myocardium. Apical akinesis.  Patient Profile     58 y.o. male with extensive CAD of both native coronaries and bypass grafts, requiring multiple previous PCI's, presenting with unstable angina/small NSTEMI, moderate aortic stenosis, acute on chronic combined systolic and diastolic heart failure, morbid obesity, OSA, DM, HTN recently declared ESRD starting hemodialysis  Assessment & Plan    1. CAD s/p NSTEMI: Angiograms reviewed with Dr. Saunders Revel and Dr. Irish Lack.  There are no good options for percutaneous revascularization.  We will continue with combined antianginal therapy and aggressive treatment of decompensated heart failure and anemia. 2. CHF: Significant reduction in LVEF documented on this admission, elevated EDP by cardiac catheterization.  On hydralazine/nitrates and carvedilol.  Will have to rely on hemodialysis for volume management.   3. AS: Moderate aortic stenosis, without exertional symptoms prior to this admission.  Not yet hemodynamically significant. 4. ESRD: Hopefully can transfuse 1 unit of blood and perform additional ultrafiltration today.  This will help with better compensation of heart failure and improvement of his angina.  5. Anemia: Receiving erythropoietin analog.  Iron stores are replete. 6. HLP: No lipid profile on this admission, roughly 1 year ago LDL 73 on current statin dose. 7. DM: controlled, hemoglobin A1c 6.1% on this admission     For questions or updates, please contact [place] Please consult [email or web address] for contact info under        Signed, Sanda Klein, MD  07/14/2019, 9:18  AM

## 2019-07-14 NOTE — Plan of Care (Signed)
  Problem: Cardiovascular: Goal: Vascular access site(s) Level 0-1 will be maintained Outcome: Progressing   Problem: Education: Goal: Understanding of CV disease, CV risk reduction, and recovery process will improve Outcome: Progressing   Problem: Clinical Measurements: Goal: Cardiovascular complication will be avoided 07/14/2019 0123 by Katha Hamming, RN Outcome: Progressing 07/14/2019 0122 by Katha Hamming, RN Outcome: Progressing

## 2019-07-14 NOTE — Progress Notes (Addendum)
[facility] PROGRESS NOTE  Assessment/ Plan: Pt is a 58 y.o. yo male  with history of CHF, CAD status post CABG, DM, OSA, CKD stage IV/V who has left upper extremity AV fistula matured admitted with an NSTEMI.  #New ESRD progressed from CKD 5/polycystic kidney disease: Started dialysis since 10/31.  Developed chest pain during dialysis yesterday and underwent urgent cardiac cath.  Total UF around 2.5 kg yesterday.  We will try a third dialysis in ICU today.  He has matured AV fistula for the access.  Arrangement for outpatient dialysis ongoing.  #NSTEMI: Cardiac cath on 11/2 with severe diffuse LAD and elevated LV pressure.  Cardiology recommended medical management and ultrafiltration during dialysis.  He is chest pain-free today.  Cardiology following.  #Mild hyperkalemia: HD today with 2K bath.  #Anemia due to CKD: Iron saturation 21%.  Start IV iron.  Continue Aranesp.  #Secondary hyperparathyroidism: PTH 211.  Monitor phosphorus level.  #Hypertension: Monitor blood pressure.  Now on HD.  Subjective: Seen and examined ICU.  He looks much better today.  Denies chest pain, shortness of breath, nausea vomiting.  HD today. Objective Vital signs in last 24 hours: Vitals:   07/14/19 0700 07/14/19 0715 07/14/19 0717 07/14/19 0751  BP: (!) 121/111 (!) 140/93    Pulse:      Resp: 20  17   Temp:    98.7 F (37.1 C)  TempSrc:    Oral  SpO2: 92%  99%   Weight:      Height:       Weight change: -0.086 kg  Intake/Output Summary (Last 24 hours) at 07/14/2019 0759 Last data filed at 07/14/2019 0519 Gross per 24 hour  Intake -  Output 2750 ml  Net -2750 ml       Labs: Basic Metabolic Panel: Recent Labs  Lab 07/12/19 1301 07/13/19 0442 07/13/19 1642 07/14/19 0355  NA 132* 133*  --  134*  K 3.7 4.0  --  5.5*  CL 99 98  --  96*  CO2 21* 19*  --  23  GLUCOSE 95 95  --  95  BUN 61* 63*  --  50*  CREATININE 6.12* 6.16* 4.66* 5.41*  CALCIUM 8.8* 8.9   --  9.2  PHOS 4.2  --   --   --    Liver Function Tests: Recent Labs  Lab 07/12/19 1301  ALBUMIN 3.5   No results for input(s): LIPASE, AMYLASE in the last 168 hours. No results for input(s): AMMONIA in the last 168 hours. CBC: Recent Labs  Lab 07/10/19 0918 07/11/19 0040 07/12/19 0414 07/12/19 1301 07/13/19 1642 07/13/19 2246  WBC 7.6 8.8 10.1 10.0 9.8  --   NEUTROABS  --  6.9  --   --   --   --   HGB 8.1* 8.5* 8.3* 8.3* 7.6* 7.9*  HCT 25.5* 26.7* 26.4* 25.1* 23.8* 25.0*  MCV 88.5 90.8 89.8 87.8 89.5  --   PLT 189 185 205 183 187  --    Cardiac Enzymes: No results for input(s): CKTOTAL, CKMB, CKMBINDEX, TROPONINI in the last 168 hours. CBG: Recent Labs  Lab 07/13/19 1345 07/13/19 1522 07/13/19 1849 07/13/19 2214 07/14/19 0709  GLUCAP 102* 99 145* 135* 99    Iron Studies: No results for input(s): IRON, TIBC, TRANSFERRIN, FERRITIN in the last 72 hours. Studies/Results: Dg Abd 2 Views  Result Date: 07/12/2019 CLINICAL DATA:  Abdominal distension EXAM: ABDOMEN - 2 VIEW COMPARISON:  None. FINDINGS: Visualized bowel  gas pattern is nonobstructive. No evidence of soft tissue mass or abnormal fluid collection. No evidence of free intraperitoneal air. No evidence of renal or ureteral calculi. No acute appearing osseous abnormality. IMPRESSION: Nonobstructive bowel gas pattern. No evidence of acute intra-abdominal abnormality. Electronically Signed   By: Franki Cabot M.D.   On: 07/12/2019 11:24    Medications: Infusions: . sodium chloride    . sodium chloride    . sodium chloride    . sodium chloride    . nitroGLYCERIN 40 mcg/min (07/13/19 1637)    Scheduled Medications: . allopurinol  100 mg Oral BID  . aspirin  81 mg Oral Daily  . atorvastatin  80 mg Oral QHS  . Chlorhexidine Gluconate Cloth  6 each Topical Q0600  . clopidogrel  75 mg Oral Daily  . [START ON 07/18/2019] darbepoetin (ARANESP) injection - DIALYSIS  200 mcg Intravenous Q Sat-HD  . diclofenac sodium   2 g Topical QID  . gabapentin  300 mg Oral Daily  . heparin  5,000 Units Subcutaneous Q8H  . Living Better with Heart Failure Book   Does not apply Once  . pantoprazole  40 mg Oral Daily  . ranolazine  500 mg Oral BID  . sodium chloride flush  3 mL Intravenous Once  . sodium chloride flush  3 mL Intravenous Q12H  . sodium chloride flush  3 mL Intravenous Q12H    have reviewed scheduled and prn medications.  Physical Exam: General:NAD, comfortable Heart:RRR, s1s2 nl Lungs:clear b/l, no crackle Abdomen:soft, Non-tender, non-distended Extremities:No edema Dialysis Access: Left AV fistula has good thrill and bruit.   Prasad  07/14/2019,7:59 AM  LOS: 4 days  Pager: ID:[phone]

## 2019-07-15 LAB — BASIC METABOLIC PANEL
Anion gap: 14 (ref 5–15)
BUN: 54 mg/dL — ABNORMAL HIGH (ref 6–20)
CO2: 22 mmol/L (ref 22–32)
Calcium: 9 mg/dL (ref 8.9–10.3)
Chloride: 97 mmol/L — ABNORMAL LOW (ref 98–111)
Creatinine, Ser: 6.24 mg/dL — ABNORMAL HIGH (ref 0.61–1.24)
GFR calc Af Amer: 10 mL/min — ABNORMAL LOW (ref >60)
GFR calc non Af Amer: 9 mL/min — ABNORMAL LOW (ref >60)
Glucose, Bld: 109 mg/dL — ABNORMAL HIGH (ref 70–99)
Potassium: 4.1 mmol/L (ref 3.5–5.1)
Sodium: 133 mmol/L — ABNORMAL LOW (ref 135–145)

## 2019-07-15 LAB — CBC
HCT: 25.8 % — ABNORMAL LOW (ref 39.0–52.0)
Hemoglobin: 8.2 g/dL — ABNORMAL LOW (ref 13.0–17.0)
MCH: 28.1 pg (ref 26.0–34.0)
MCHC: 31.8 g/dL (ref 30.0–36.0)
MCV: 88.4 fL (ref 80.0–100.0)
Platelets: 200 10*3/uL (ref 150–400)
RBC: 2.92 MIL/uL — ABNORMAL LOW (ref 4.22–5.81)
RDW: 16.9 % — ABNORMAL HIGH (ref 11.5–15.5)
WBC: 8.4 10*3/uL (ref 4.0–10.5)
nRBC: 1.2 % — ABNORMAL HIGH (ref 0.0–0.2)

## 2019-07-15 LAB — GLUCOSE, CAPILLARY
Glucose-Capillary: 102 mg/dL — ABNORMAL HIGH (ref 70–99)
Glucose-Capillary: 132 mg/dL — ABNORMAL HIGH (ref 70–99)
Glucose-Capillary: 155 mg/dL — ABNORMAL HIGH (ref 70–99)

## 2019-07-15 MED ORDER — LIDOCAINE-PRILOCAINE 2.5-2.5 % EX CREA: 1.0000 "application " | TOPICAL_CREAM | CUTANEOUS | Status: DC | PRN

## 2019-07-15 MED ORDER — LIDOCAINE HCL (PF) 1 % IJ SOLN: 5.0000 mL | INTRAMUSCULAR | Status: DC | PRN

## 2019-07-15 MED ORDER — HEPARIN SODIUM (PORCINE) 1000 UNIT/ML DIALYSIS: 1000.0000 [IU] | INTRAMUSCULAR | Status: DC | PRN

## 2019-07-15 MED ORDER — SODIUM CHLORIDE 0.9 % IV SOLN: 100.0000 mL | INTRAVENOUS | Status: DC | PRN

## 2019-07-15 MED ORDER — PENTAFLUOROPROP-TETRAFLUOROETH EX AERO: 1.0000 "application " | INHALATION_SPRAY | CUTANEOUS | Status: DC | PRN

## 2019-07-15 MED ORDER — CHLORHEXIDINE GLUCONATE CLOTH 2 % EX PADS
6.0000 | MEDICATED_PAD | Freq: Every day | CUTANEOUS | Status: DC
  Administered 2019-07-16: 6 via TOPICAL

## 2019-07-15 MED ORDER — ALTEPLASE 2 MG IJ SOLR: 2.0000 mg | Freq: Once | INTRAMUSCULAR | Status: DC | PRN

## 2019-07-15 NOTE — Progress Notes (Signed)
RT placed pt on CPAP dream station on auto titrate max 16 min 8 w/no O2 bled into the system. Pt respiratory status is stable on CPAP at this time. RT will continue to monitor.

## 2019-07-15 NOTE — Progress Notes (Signed)
Per Dr Carolin Sicks, hold all cardiac medications until after dialysis

## 2019-07-15 NOTE — Care Management Important Message (Signed)
Important Message  Patient Details  Name: Matthew GATSON Sr. MRN: [id number] Date of Birth: 1961/02/18   Medicare Important Message Given:  Yes     Shelda Altes 07/15/2019, 12:44 PM

## 2019-07-15 NOTE — Progress Notes (Signed)
PROGRESS NOTE    Matthew Rowe   [id number]  DOB: [birth year]  DOA: 07/10/2019 PCP: [name], MD   Brief Narrative:  Javarius Slavey. is a 58 y.o. male with medical history significant of combined systolic and diastolic CHF, CAD s/p CABG, chronic anemia, CKD stage IV, diabetes mellitus type 2, OSA on CPAP.  He presented for chest pain.  In the emergency room, elevated high-sensitivity troponin XX 6>>> 130.  Creatinine 6.  Cardiology evaluated.  Started on heparin and nitroglycerin.  Initially declined to have cardiac cath, ultimately underwent cardiac cath after developing chest pain with hemodialysis.  Newly started on dialysis.  Subjective: No major complaints.  Denies any chest pain shortness of breath.  Could not finish dialysis yesterday.  Assessment & Plan:   Principal Problem:   NSTEMI (non-ST elevated myocardial infarction)  - CAD s/p CABG and multiple PCIs - on ASA, Plavix, statin, Ranexa, Imdur and Heparin - due to chest pain immediately after dialysis on 11/2,  the patient decided to pursue a cath which shows diffuse CAD- medical management recommended including removal for more fluid as EDP was elevated - on nitroglycerin infusion by cardiology being titrated for pain- awaiting further dialysis   Acute systolic congestive heart failure - new finding of systolic CHF with EF of [id number] on ECHO from this admission.  Fluid management with hemodialysis.    Polycystic kidney disease- CKD 5 - Cr ~ 6-7 range, GRF 8 - he has an AV fistula already - dialysis started on 10/3.  He will be on dialysis.  Outpatient arrangements made.  Anemia of chronic disease - Cardiology would like him to receive 1 U PRBC which he will receive with dialysis today  Abdominal distension -Improved.    HTN - holding Amlodipine, Hydralazine, Coreg as BP is low and he has started dialysis.      OSA on CPAP - CPAP ordered in hospital    Type 2 diabetes with nephropathy  - A1c  6.1 - he takes 20 U of 70/30 BID and Ozempic on Saturdays - 70/30 resumed.  Blood sugars stable.    Gout - Allopurinal    Time spent in minutes: 25 minutes DVT prophylaxis: Heparin sq Code Status: Full code Family Communication:  Disposition Plan: Pending clinical improvement. Consultants:   Cardiology  nephrology  Procedures:  2 D ECHO 1. Left ventricular ejection fraction, by visual estimation, is 35 to 40%. The left ventricle has moderate to severely decreased function. There is no left ventricular hypertrophy.  2. Mid inferoseptal segment and mid inferior segment are abnormal.  3. Definity contrast agent was given IV to delineate the left ventricular endocardial borders.  4. Abnormal septal motion consistent with left bundle branch block.  5. Left ventricular diastolic parameters are consistent with Grade II diastolic dysfunction (pseudonormalization).  6. Global right ventricle has normal systolic function.The right ventricular size is moderately enlarged. No increase in right ventricular wall thickness.  7. Left atrial size was severely dilated.  8. Right atrial size was severely dilated.  9. The mitral valve is normal in structure. Mild mitral valve regurgitation. No evidence of mitral stenosis. 10. The tricuspid valve is normal in structure. Tricuspid valve regurgitation moderate-severe. 11. Aortic valve mean gradient measures 22.0 mmHg. 12. Aortic valve peak gradient measures 39.1 mmHg. 13. The aortic valve is normal in structure. Aortic valve regurgitation is not visualized. Moderate aortic valve stenosis. 14. There is Moderate calcification of the aortic valve. 15. There is Moderate thickening  of the aortic valve. 16. The pulmonic valve was normal in structure. Pulmonic valve regurgitation is trivial. 17. Mildly elevated pulmonary artery systolic pressure. 18. The inferior vena cava is normal in size with greater than 50% respiratory variability, suggesting right atrial  pressure of 3 mmHg.  Antimicrobials:  Anti-infectives (From admission, onward)   None       Objective: Vitals:   07/15/19 1200 07/15/19 1230 07/15/19 1300 07/15/19 1330  BP: 110/61 123/68 (!) 147/73 129/70  Pulse: 75 77 79 79  Resp: (!) 24   (!) 25  Temp:      TempSrc:      SpO2:      Weight:      Height:        Intake/Output Summary (Last 24 hours) at 07/15/2019 1355 Last data filed at 07/15/2019 0300 Gross per 24 hour  Intake 440 ml  Output -442 ml  Net 882 ml   Filed Weights   07/14/19 1325 07/15/19 0617 07/15/19 1040  Weight: 112 kg 109.1 kg 110.9 kg    Examination: General exam: Appears comfortable, on room air. HEENT: PERRLA, oral mucosa moist, no sclera icterus or thrush Respiratory system: Clear to auscultation. Respiratory effort normal. Cardiovascular system: S1 & S2 heard,  No murmurs  Gastrointestinal system: Abdomen soft, non-tender. Normal bowel sounds   Central nervous system: Alert and oriented. No focal neurological deficits. Extremities: No cyanosis, clubbing or edema Skin: No rashes or ulcers Psychiatry:  Mood & affect appropriate.    Data Reviewed: I have personally reviewed following labs and imaging studies  CBC: Recent Labs  Lab 07/11/19 0040 07/12/19 0414 07/12/19 1301 07/13/19 1642 07/13/19 2246 07/14/19 0904  WBC 8.8 10.1 10.0 9.8  --  9.0  NEUTROABS 6.9  --   --   --   --   --   HGB 8.5* 8.3* 8.3* 7.6* 7.9* 7.9*  HCT 26.7* 26.4* 25.1* 23.8* 25.0* 25.1*  MCV 90.8 89.8 87.8 89.5  --  89.6  PLT 185 205 183 187  --  [id number]   Basic Metabolic Panel: Recent Labs  Lab 07/12/19 1301 07/13/19 0442 07/13/19 1642 07/14/19 0355 07/14/19 0904 07/15/19 0204  NA 132* 133*  --  134* 134* 133*  K 3.7 4.0  --  5.5* 3.8 4.1  CL 99 98  --  96* 95* 97*  CO2 21* 19*  --  23 23 22   GLUCOSE 95 95  --  95 164* 109*  BUN 61* 63*  --  50* 52* 54*  CREATININE 6.12* 6.16* 4.66* 5.41* 5.42* 6.24*  CALCIUM 8.8* 8.9  --  9.2 9.0 9.0  PHOS 4.2  --    --   --  4.3  --    GFR: Estimated Creatinine Clearance: 14.8 mL/min (A) (by C-G formula based on SCr of 6.24 mg/dL (H)). Liver Function Tests: Recent Labs  Lab 07/12/19 1301 07/14/19 0904  ALBUMIN 3.5 3.2*   No results for input(s): LIPASE, AMYLASE in the last 168 hours. No results for input(s): AMMONIA in the last 168 hours. Coagulation Profile: No results for input(s): INR, PROTIME in the last 168 hours. Cardiac Enzymes: No results for input(s): CKTOTAL, CKMB, CKMBINDEX, TROPONINI in the last 168 hours. BNP (last 3 results) No results for input(s): PROBNP in the last 8760 hours. HbA1C: No results for input(s): HGBA1C in the last 72 hours. CBG: Recent Labs  Lab 07/14/19 0709 07/14/19 1136 07/14/19 1528 07/14/19 2134 07/15/19 0615  GLUCAP 99 97 129* 130* 102*  Lipid Profile: No results for input(s): CHOL, HDL, LDLCALC, TRIG, CHOLHDL, LDLDIRECT in the last 72 hours. Thyroid Function Tests: No results for input(s): TSH, T4TOTAL, FREET4, T3FREE, THYROIDAB in the last 72 hours. Anemia Panel: No results for input(s): VITAMINB12, FOLATE, FERRITIN, TIBC, IRON, RETICCTPCT in the last 72 hours. Urine analysis:    Component Value Date/Time   COLORURINE STRAW (A) 06/20/2018 2033   APPEARANCEUR CLEAR 06/20/2018 2033   LABSPEC 1.006 06/20/2018 2033   PHURINE 6.0 06/20/2018 2033   GLUCOSEU NEGATIVE 06/20/2018 2033   HGBUR NEGATIVE 06/20/2018 2033   BILIRUBINUR NEGATIVE 06/20/2018 2033   [place] NEGATIVE 06/20/2018 2033   PROTEINUR NEGATIVE 06/20/2018 2033   UROBILINOGEN 1.0 07/17/2015 1425   NITRITE NEGATIVE 06/20/2018 2033   LEUKOCYTESUR NEGATIVE 06/20/2018 2033   Sepsis Labs: @LABRCNTIP (procalcitonin:4,lacticidven:4) ) Recent Results (from the past 240 hour(s))  SARS Coronavirus 2 by RT PCR (hospital order, performed in [facility] hospital lab) Nasopharyngeal Nasopharyngeal Swab     Status: None   Collection Time: 07/10/19  1:30 PM   Specimen: Nasopharyngeal Swab   Result Value Ref Range Status   SARS Coronavirus 2 NEGATIVE NEGATIVE Final    Comment: (NOTE) If result is NEGATIVE SARS-CoV-2 target nucleic acids are NOT DETECTED. The SARS-CoV-2 RNA is generally detectable in upper and lower  respiratory specimens during the acute phase of infection. The lowest  concentration of SARS-CoV-2 viral copies this assay can detect is 250  copies / mL. A negative result does not preclude SARS-CoV-2 infection  and should not be used as the sole basis for treatment or other  patient management decisions.  A negative result may occur with  improper specimen collection / handling, submission of specimen other  than nasopharyngeal swab, presence of viral mutation(s) within the  areas targeted by this assay, and inadequate number of viral copies  (<250 copies / mL). A negative result must be combined with clinical  observations, patient history, and epidemiological information. If result is POSITIVE SARS-CoV-2 target nucleic acids are DETECTED. The SARS-CoV-2 RNA is generally detectable in upper and lower  respiratory specimens dur ing the acute phase of infection.  Positive  results are indicative of active infection with SARS-CoV-2.  Clinical  correlation with patient history and other diagnostic information is  necessary to determine patient infection status.  Positive results do  not rule out bacterial infection or co-infection with other viruses. If result is PRESUMPTIVE POSTIVE SARS-CoV-2 nucleic acids MAY BE PRESENT.   A presumptive positive result was obtained on the submitted specimen  and confirmed on repeat testing.  While 2019 novel coronavirus  (SARS-CoV-2) nucleic acids may be present in the submitted sample  additional confirmatory testing may be necessary for epidemiological  and / or clinical management purposes  to differentiate between  SARS-CoV-2 and other Sarbecovirus currently known to infect humans.  If clinically indicated additional  testing with an alternate test  methodology [phone]) is advised. The SARS-CoV-2 RNA is generally  detectable in upper and lower respiratory sp ecimens during the acute  phase of infection. The expected result is Negative. Fact Sheet for Patients:  StrictlyIdeas.no Fact Sheet for Healthcare Providers: BankingDealers.co.za This test is not yet approved or cleared by the [place] FDA and has been authorized for detection and/or diagnosis of SARS-CoV-2 by FDA under an Emergency Use Authorization (EUA).  This EUA will remain in effect (meaning this test can be used) for the duration of the COVID-19 declaration under Section 564(b)(1) of the Act, 21 U.S.C. section 360bbb-3(b)(1),  unless the authorization is terminated or revoked sooner. Performed at [facility], [place] [address] [place], [place] [address]   MRSA PCR Screening     Status: None   Collection Time: 07/13/19  6:11 PM   Specimen: Nasopharyngeal  Result Value Ref Range Status   MRSA by PCR NEGATIVE NEGATIVE Final    Comment:        The GeneXpert MRSA Assay (FDA approved for NASAL specimens only), is one component of a comprehensive MRSA colonization surveillance program. It is not intended to diagnose MRSA infection nor to guide or monitor treatment for MRSA infections. Performed at [facility], [place] [address], [place], [place] [address]          Radiology Studies: No results found.    Scheduled Meds: . allopurinol  100 mg Oral BID  . aspirin  81 mg Oral Daily  . atorvastatin  80 mg Oral QHS  . carvedilol  25 mg Oral BID WC  . Chlorhexidine Gluconate Cloth  6 each Topical Q0600  . Chlorhexidine Gluconate Cloth  6 each Topical Q0600  . clopidogrel  75 mg Oral Daily  . [START ON 07/18/2019] darbepoetin (ARANESP) injection - DIALYSIS  200 mcg Intravenous Q Sat-HD  . diclofenac sodium  2 g Topical QID  . gabapentin  300 mg Oral Daily  . heparin   5,000 Units Subcutaneous Q8H  . isosorbide mononitrate  60 mg Oral BID  . Living Better with Heart Failure Book   Does not apply Once  . pantoprazole  40 mg Oral Daily  . ranolazine  500 mg Oral BID  . sodium chloride flush  3 mL Intravenous Once  . sodium chloride flush  3 mL Intravenous Q12H  . sodium chloride flush  3 mL Intravenous Q12H   Continuous Infusions: . sodium chloride    . sodium chloride    . sodium chloride    . sodium chloride    . ferric gluconate (FERRLECIT/NULECIT) IV Stopped (07/14/19 1256)  . nitroGLYCERIN 40 mcg/min (07/13/19 1637)     LOS: 5 days   Time spent : 25 minutes

## 2019-07-15 NOTE — Progress Notes (Signed)
Patient has been accepted for OP HD treatment at [facility] on a MWF schedule with a seat time of 11:50am. He needs to arrive at 11:30am for his appointments.  He needs to arrive at 10:45am on his first day of treatment. Renal Navigator notified Nephrologist/Dr. Carolin Sicks and will meet with patient to update.  Alphonzo Cruise, [place] Renal Navigator [phone]

## 2019-07-15 NOTE — Plan of Care (Signed)
  Problem: Education: Goal: Knowledge of General Education information will improve Description: Including pain rating scale, medication(s)/side effects and non-pharmacologic comfort measures Outcome: Progressing   Problem: Health Behavior/Discharge Planning: Goal: Ability to manage health-related needs will improve Outcome: Progressing   Problem: Clinical Measurements: Goal: Ability to maintain clinical measurements within normal limits will improve Outcome: Progressing Goal: Will remain free from infection Outcome: Progressing Goal: Diagnostic test results will improve Outcome: Progressing Goal: Respiratory complications will improve Outcome: Progressing Goal: Cardiovascular complication will be avoided Outcome: Progressing   Problem: Activity: Goal: Risk for activity intolerance will decrease Outcome: Progressing   Problem: Nutrition: Goal: Adequate nutrition will be maintained Outcome: Progressing   Problem: Coping: Goal: Level of anxiety will decrease Outcome: Progressing   Problem: Elimination: Goal: Will not experience complications related to bowel motility Outcome: Progressing Goal: Will not experience complications related to urinary retention Outcome: Progressing   Problem: Pain Managment: Goal: General experience of comfort will improve Outcome: Progressing   Problem: Safety: Goal: Ability to remain free from injury will improve Outcome: Progressing   Problem: Skin Integrity: Goal: Risk for impaired skin integrity will decrease Outcome: Progressing   Problem: Education: Goal: Understanding of CV disease, CV risk reduction, and recovery process will improve Outcome: Progressing Goal: Individualized Educational Video(s) Outcome: Progressing   Problem: Activity: Goal: Ability to return to baseline activity level will improve Outcome: Progressing   Problem: Cardiovascular: Goal: Ability to achieve and maintain adequate cardiovascular perfusion  will improve Outcome: Progressing Goal: Vascular access site(s) Level 0-1 will be maintained Outcome: Progressing   Problem: Health Behavior/Discharge Planning: Goal: Ability to safely manage health-related needs after discharge will improve Outcome: Progressing   Problem: Education: Goal: Understanding of cardiac disease, CV risk reduction, and recovery process will improve Outcome: Progressing Goal: Understanding of medication regimen will improve Outcome: Progressing Goal: Individualized Educational Video(s) Outcome: Progressing   Problem: Activity: Goal: Ability to tolerate increased activity will improve Outcome: Progressing   Problem: Cardiac: Goal: Ability to achieve and maintain adequate cardiopulmonary perfusion will improve Outcome: Progressing Goal: Vascular access site(s) Level 0-1 will be maintained Outcome: Progressing   Problem: Health Behavior/Discharge Planning: Goal: Ability to safely manage health-related needs after discharge will improve Outcome: Progressing   

## 2019-07-15 NOTE — Progress Notes (Signed)
Patient via bed to dialysis

## 2019-07-15 NOTE — Progress Notes (Addendum)
[facility] PROGRESS NOTE  Assessment/ Plan: Pt is a 58 y.o. yo male  with history of CHF, CAD status post CABG, DM, OSA, CKD stage IV/V who has left upper extremity AV fistula matured admitted with an NSTEMI.  #New ESRD progressed from CKD 5/polycystic kidney disease: Started dialysis since 10/31.  Developed chest pain during dialysis ON 11/2 and underwent urgent cardiac cath.  -Unable to do dialysis yesterday because of AV fistula infiltration.  Plan for HD today.  Need expert cannulation, fistula has good thrill and bruit. Arrangement for outpatient dialysis ongoing.  #NSTEMI: Cardiac cath on 11/2 with severe diffuse LAD and elevated LV pressure.  Cardiology recommended medical management and ultrafiltration during dialysis.  He is chest pain-free today.  Cardiology following.  #Mild hyperkalemia: Improved, now on dialysis.  #Anemia due to CKD: Iron saturation 21%.  Continue IV iron and Aranesp.  #Secondary hyperparathyroidism: PTH 211.  Monitor phosphorus level.  #Hypertension: Blood pressure soft.  I discontinued amlodipine.  Cardiology started on hydralazine, isosorbide, discussed with the nurse to hold before dialysis.  Monitor BP.  Subjective: Seen and examined.  Sitting on bed comfortable.  Not on oxygen.  Denies chest pain, shortness of breath, nausea vomiting. Objective Vital signs in last 24 hours: Vitals:   07/15/19 0145 07/15/19 0425 07/15/19 0515 07/15/19 0617  BP:  92/74    Pulse:      Resp: 17 (!) 26 20   Temp:  (!) 97.4 F (36.3 C)    TempSrc:  Oral    SpO2:  100%    Weight:    109.1 kg  Height:       Weight change: 1 kg  Intake/Output Summary (Last 24 hours) at 07/15/2019 0814 Last data filed at 07/15/2019 0300 Gross per 24 hour  Intake 550.2 ml  Output -442 ml  Net 992.2 ml       Labs: Basic Metabolic Panel: Recent Labs  Lab 07/12/19 1301  07/14/19 0355 07/14/19 0904 07/15/19 0204  NA 132*   < > 134* 134* 133*  K 3.7    < > 5.5* 3.8 4.1  CL 99   < > 96* 95* 97*  CO2 21*   < > 23 23 22   GLUCOSE 95   < > 95 164* 109*  BUN 61*   < > 50* 52* 54*  CREATININE 6.12*   < > 5.41* 5.42* 6.24*  CALCIUM 8.8*   < > 9.2 9.0 9.0  PHOS 4.2  --   --  4.3  --    < > = values in this interval not displayed.   Liver Function Tests: Recent Labs  Lab 07/12/19 1301 07/14/19 0904  ALBUMIN 3.5 3.2*   No results for input(s): LIPASE, AMYLASE in the last 168 hours. No results for input(s): AMMONIA in the last 168 hours. CBC: Recent Labs  Lab 07/11/19 0040 07/12/19 0414 07/12/19 1301 07/13/19 1642 07/13/19 2246 07/14/19 0904  WBC 8.8 10.1 10.0 9.8  --  9.0  NEUTROABS 6.9  --   --   --   --   --   HGB 8.5* 8.3* 8.3* 7.6* 7.9* 7.9*  HCT 26.7* 26.4* 25.1* 23.8* 25.0* 25.1*  MCV 90.8 89.8 87.8 89.5  --  89.6  PLT 185 205 183 187  --  184   Cardiac Enzymes: No results for input(s): CKTOTAL, CKMB, CKMBINDEX, TROPONINI in the last 168 hours. CBG: Recent Labs  Lab 07/14/19 0709 07/14/19 1136 07/14/19 1528 07/14/19 2134 07/15/19 0615  GLUCAP 99 97 129* 130* 102*    Iron Studies: No results for input(s): IRON, TIBC, TRANSFERRIN, FERRITIN in the last 72 hours. Studies/Results: No results found.  Medications: Infusions: . sodium chloride    . sodium chloride    . sodium chloride    . sodium chloride    . ferric gluconate (FERRLECIT/NULECIT) IV Stopped (07/14/19 1256)  . nitroGLYCERIN 40 mcg/min (07/13/19 1637)    Scheduled Medications: . allopurinol  100 mg Oral BID  . aspirin  81 mg Oral Daily  . atorvastatin  80 mg Oral QHS  . carvedilol  25 mg Oral BID WC  . Chlorhexidine Gluconate Cloth  6 each Topical Q0600  . Chlorhexidine Gluconate Cloth  6 each Topical Q0600  . clopidogrel  75 mg Oral Daily  . [START ON 07/18/2019] darbepoetin (ARANESP) injection - DIALYSIS  200 mcg Intravenous Q Sat-HD  . diclofenac sodium  2 g Topical QID  . gabapentin  300 mg Oral Daily  . heparin  5,000 Units  Subcutaneous Q8H  . hydrALAZINE  50 mg Oral Q8H  . isosorbide mononitrate  60 mg Oral BID  . Living Better with Heart Failure Book   Does not apply Once  . pantoprazole  40 mg Oral Daily  . ranolazine  500 mg Oral BID  . sodium chloride flush  3 mL Intravenous Once  . sodium chloride flush  3 mL Intravenous Q12H  . sodium chloride flush  3 mL Intravenous Q12H    have reviewed scheduled and prn medications.  Physical Exam: General:NAD, comfortable Heart:RRR, s1s2 nl Lungs: Clear b/l, no crackle Abdomen:soft, Non-tender, non-distended Extremities:No edema Dialysis Access: Left AV fistula has good thrill and bruit.  No hematoma noticed.  Dron Prasad Bhandari 07/15/2019,8:14 AM  LOS: 5 days  Pager: ID:[phone]

## 2019-07-15 NOTE — Progress Notes (Signed)
Progress Note  Patient Name: Matthew DANNEMILLER Sr. Date of Encounter: 07/15/2019  Primary Cardiologist: Larae Grooms, MD   Subjective   Unable to perform complete hemodialysis yesterday since his AV access clotted.  He did not receive a transfusion. Blood pressure has been relatively low overnight, after restarting all his cardiac medications. He is lying completely supine in bed breathing comfortably.  No angina.  Inpatient Medications    Scheduled Meds: . allopurinol  100 mg Oral BID  . aspirin  81 mg Oral Daily  . atorvastatin  80 mg Oral QHS  . carvedilol  25 mg Oral BID WC  . Chlorhexidine Gluconate Cloth  6 each Topical Q0600  . Chlorhexidine Gluconate Cloth  6 each Topical Q0600  . clopidogrel  75 mg Oral Daily  . [START ON 07/18/2019] darbepoetin (ARANESP) injection - DIALYSIS  200 mcg Intravenous Q Sat-HD  . diclofenac sodium  2 g Topical QID  . gabapentin  300 mg Oral Daily  . heparin  5,000 Units Subcutaneous Q8H  . isosorbide mononitrate  60 mg Oral BID  . Living Better with Heart Failure Book   Does not apply Once  . pantoprazole  40 mg Oral Daily  . ranolazine  500 mg Oral BID  . sodium chloride flush  3 mL Intravenous Once  . sodium chloride flush  3 mL Intravenous Q12H  . sodium chloride flush  3 mL Intravenous Q12H   Continuous Infusions: . sodium chloride    . sodium chloride    . sodium chloride    . sodium chloride    . ferric gluconate (FERRLECIT/NULECIT) IV Stopped (07/14/19 1256)  . nitroGLYCERIN 40 mcg/min (07/13/19 1637)   PRN Meds: sodium chloride, sodium chloride, sodium chloride, sodium chloride, acetaminophen, alteplase, bisacodyl, guaiFENesin-dextromethorphan, heparin, insulin aspart protamine- aspart, lidocaine (PF), lidocaine-prilocaine, nitroGLYCERIN, ondansetron (ZOFRAN) IV, pentafluoroprop-tetrafluoroeth, senna, sodium chloride flush, sodium chloride flush, traMADol, zolpidem   Vital Signs    Vitals:   07/15/19 0425 07/15/19  0515 07/15/19 0617 07/15/19 0823  BP: 92/74   110/68  Pulse:    77  Resp: (!) 26 20    Temp: (!) 97.4 F (36.3 C)   97.8 F (36.6 C)  TempSrc: Oral   Oral  SpO2: 100%     Weight:   109.1 kg   Height:        Intake/Output Summary (Last 24 hours) at 07/15/2019 1000 Last data filed at 07/15/2019 0300 Gross per 24 hour  Intake 550.2 ml  Output -442 ml  Net 992.2 ml   Last 3 Weights 07/15/2019 07/14/2019 07/13/2019  Weight (lbs) 240 lb 8.4 oz 246 lb 14.6 oz 244 lb 11.4 oz  Weight (kg) 109.1 kg 112 kg 111 kg      Telemetry    Sinus rhythm, PVCs- Personally Reviewed  ECG    No new tracing, previous ECG from November 2 showed normal sinus rhythm left bundle branch block and what appeared to be new nonspecific ST segment elevation in the anterior precordial leads - Personally Reviewed  Physical Exam  Obese.  Comfortable lying fully flat. GEN: No acute distress.   Neck: No JVD Cardiac: RRR, 2/6 early peaking systolic ejection murmur in the aortic focus, no diastolic murmurs, rubs, or gallops.  Appears to have good thrill/bruit overlying his AV fistula. Respiratory: Clear to auscultation bilaterally. GI: Soft, nontender, non-distended  MS: No edema; No deformity. Neuro:  Nonfocal  Psych: Normal affect   Labs    High Sensitivity Troponin:  Recent Labs  Lab 07/10/19 0918 07/10/19 1118  TROPONINIHS 26* 130*      Chemistry Recent Labs  Lab 07/12/19 1301  07/14/19 0355 07/14/19 0904 07/15/19 0204  NA 132*   < > 134* 134* 133*  K 3.7   < > 5.5* 3.8 4.1  CL 99   < > 96* 95* 97*  CO2 21*   < > 23 23 22   GLUCOSE 95   < > 95 164* 109*  BUN 61*   < > 50* 52* 54*  CREATININE 6.12*   < > 5.41* 5.42* 6.24*  CALCIUM 8.8*   < > 9.2 9.0 9.0  ALBUMIN 3.5  --   --  3.2*  --   GFRNONAA 9*   < > 11* 11* 9*  GFRAA 11*   < > 12* 12* 10*  ANIONGAP 12   < > 15 16* 14   < > = values in this interval not displayed.     Hematology Recent Labs  Lab 07/12/19 1301 07/13/19 1642  07/13/19 2246 07/14/19 0904  WBC 10.0 9.8  --  9.0  RBC 2.86* 2.66*  --  2.80*  HGB 8.3* 7.6* 7.9* 7.9*  HCT 25.1* 23.8* 25.0* 25.1*  MCV 87.8 89.5  --  89.6  MCH 29.0 28.6  --  28.2  MCHC 33.1 31.9  --  31.5  RDW 15.9* 16.2*  --  16.6*  PLT 183 187  --  184    BNP Recent Labs  Lab 07/10/19 0918  BNP 1,468.8*     DDimer No results for input(s): DDIMER in the last 168 hours.   Radiology    No results found.  Cardiac Studies   07/13/2019 cardiac catheterization: Conclusions: 1. Severe native coronary artery disease, as detailed below. Ostial LAD disease appears stable compared with prior catheterization a year ago. Severe mid/distal LAD disease may be slightly worse but does not supply a significant territory. Distal LMCA, ramus, and LCx disease has not changed significantly since 06/2018. 2. Patent RIMA-RCA. There appears to be at least 50% stenosis of the mid RCA beyond the RIMA anastomosis, though evaluation is limited by competitive flow. Mid/distal RCA stent is patent but may have moderate in-stent restenosis. Distal RCA branches are small and diffusely diseased. 3. Moderately to severely elevated left ventricular filling pressure (LVEDP 30-35 mmHg).  Recommendations: 1. Aggressive medical therapy, including titration of nitroglycerin infusion for relief of chest pain. 2. Continue fluid removal via hemodialysis and diuresis, as elevated LVEDP is likely contributing to angina. 3. Consider PRBC transfusion in the setting of gradually worsening chronic anemia; hemoglobin is down to 7.6 this afternoon. Recommend targeting hemoglobin greater than eight and resolution of symptoms. 4. I would reserve PCI to the mid RCA for refractory symptoms. I do not believe that intervention on the LAD would offer much improvement in symptoms given prior infarct in this territory and diffuse nature of disease.   07/12/2019 echo: 1. Left ventricular ejection fraction, by visual  estimation, is 35 to 40%. The left ventricle has moderate to severely decreased function. There is no left ventricular hypertrophy. 2. Mid inferoseptal segment and mid inferior segment are abnormal. 3. Definity contrast agent was given IV to delineate the left ventricular endocardial borders. 4. Abnormal septal motion consistent with left bundle branch block. 5. Left ventricular diastolic parameters are consistent with Grade II diastolic dysfunction (pseudonormalization). 6. Global right ventricle has normal systolic function.The right ventricular size is moderately enlarged. No increase in right ventricular wall  thickness. 7. Left atrial size was severely dilated. 8. Right atrial size was severely dilated. 9. The mitral valve is normal in structure. Mild mitral valve regurgitation. No evidence of mitral stenosis. 10. The tricuspid valve is normal in structure. Tricuspid valve regurgitation moderate-severe. 11. Aortic valve mean gradient measures 22.0 mmHg. 12. Aortic valve peak gradient measures 39.1 mmHg. 13. The aortic valve is normal in structure. Aortic valve regurgitation is not visualized. Moderate aortic valve stenosis. 14. There is Moderate calcification of the aortic valve. 15. There is Moderate thickening of the aortic valve. 16. The pulmonic valve was normal in structure. Pulmonic valve regurgitation is trivial. 17. Mildly elevated pulmonary artery systolic pressure. 18. The inferior vena cava is normal in size with greater than 50% respiratory variability, suggesting right atrial pressure of 3 mmHg.  06/21/2018 echo: - Left ventricle: The cavity size was normal. The estimated ejection fraction was in the range of 55% to 60%. Hypokinesis of the apical inferior, mid-apical inferoseptal, and mid-apical anteroseptal myocardium. Apical akinesis.   Patient Profile     58 y.o. male with extensive CAD of both native coronaries and bypass grafts, requiring multiple  previous PCI's, presenting with unstable angina/small NSTEMI, moderate aortic stenosis, acute on chronic combined systolic and diastolic heart failure, morbid obesity, OSA, DM, HTN recently declared ESRD starting hemodialysis.   Repeat cardiac catheterization 07/13/2019 shows anatomy that is not amenable to percutaneous revascularization with stenoses involving both the LAD artery and the right coronary artery at the bypass anastomosis.  It also showed markedly elevated left ventricular end-diastolic filling pressures despite hemodialysis.  Assessment & Plan    1. CAD s/p NSTEMI:  After evaluation by 2 interventional cardiologist the recommendation is for medical therapy.  There are no good options for percutaneous revascularization.    Treat decompensated heart failure, anemia, combination antianginal therapy (high-dose beta-blocker, calcium channel blocker, long-acting nitrates).  Currently free of angina at rest.  Consider re-adding ranolazine, although some risk involved in the setting of left bundle branch block-related to prolonged QT interval and end-stage renal disease. Blood pressure is relatively low and will hold his medications before hemodialysis today.  Will discontinue hydralazine at least for the time being.  Prefer the use of antihypertensive medications that also have antianginal properties. 2. CHF: Significant reduction in LVEF documented on this admission, elevated EDP by cardiac catheterization.  On hydralazine/nitrates and carvedilol.    Hydralazine temporarily on hold with low blood pressure.  Will have to rely on hemodialysis for volume management.   3. AS: Moderate aortic stenosis, without exertional symptoms prior to this admission.  Not yet hemodynamically significant. 4. ESRD: Hopefully can transfuse 1 unit of blood and perform additional ultrafiltration today.  This will help with better compensation of heart failure and improvement of his angina.  5. Anemia: Receiving  erythropoietin analog.  Iron stores are replete. 6. HLP: No lipid profile on this admission, roughly 1 year ago LDL 73 on current statin dose. 7. DM: controlled, hemoglobin A1c 6.1% on this admission     For questions or updates, please contact [place] Please consult [email or web address] for contact info under        Signed, Sanda Klein, MD  07/15/2019, 10:00 AM

## 2019-07-16 LAB — GLUCOSE, CAPILLARY
Glucose-Capillary: 107 mg/dL — ABNORMAL HIGH (ref 70–99)
Glucose-Capillary: 99 mg/dL (ref 70–99)

## 2019-07-16 MED ORDER — ISOSORBIDE MONONITRATE ER 60 MG PO TB24: 60.0000 mg | ORAL_TABLET | Freq: Two times a day (BID) | ORAL | 0 refills | Status: AC

## 2019-07-16 MED ORDER — CARVEDILOL 25 MG PO TABS: 25.0000 mg | ORAL_TABLET | Freq: Two times a day (BID) | ORAL | 0 refills | Status: DC

## 2019-07-16 MED ORDER — GABAPENTIN 300 MG PO CAPS: 300.0000 mg | ORAL_CAPSULE | Freq: Every day | ORAL | 0 refills | Status: DC

## 2019-07-16 NOTE — Progress Notes (Signed)
Progress Note  Patient Name: Matthew PAULO Sr. Date of Encounter: 07/16/2019  Primary Cardiologist: Larae Grooms, MD   Subjective   No angina or dyspnea at rest.  Blood pressure is low normal even with holding hydralazine. Hemoglobin 8.2 the afternoon.  Not rechecked yet today.  Inpatient Medications    Scheduled Meds: . allopurinol  100 mg Oral BID  . aspirin  81 mg Oral Daily  . atorvastatin  80 mg Oral QHS  . carvedilol  25 mg Oral BID WC  . Chlorhexidine Gluconate Cloth  6 each Topical Q0600  . Chlorhexidine Gluconate Cloth  6 each Topical Q0600  . clopidogrel  75 mg Oral Daily  . [START ON 07/18/2019] darbepoetin (ARANESP) injection - DIALYSIS  200 mcg Intravenous Q Sat-HD  . diclofenac sodium  2 g Topical QID  . gabapentin  300 mg Oral Daily  . heparin  5,000 Units Subcutaneous Q8H  . isosorbide mononitrate  60 mg Oral BID  . Living Better with Heart Failure Book   Does not apply Once  . pantoprazole  40 mg Oral Daily  . ranolazine  500 mg Oral BID  . sodium chloride flush  3 mL Intravenous Once  . sodium chloride flush  3 mL Intravenous Q12H  . sodium chloride flush  3 mL Intravenous Q12H   Continuous Infusions: . sodium chloride    . sodium chloride    . ferric gluconate (FERRLECIT/NULECIT) IV Stopped (07/14/19 1256)  . nitroGLYCERIN 40 mcg/min (07/13/19 1637)   PRN Meds: sodium chloride, sodium chloride, acetaminophen, bisacodyl, guaiFENesin-dextromethorphan, insulin aspart protamine- aspart, nitroGLYCERIN, ondansetron (ZOFRAN) IV, senna, sodium chloride flush, sodium chloride flush, traMADol, zolpidem   Vital Signs    Vitals:   07/16/19 0041 07/16/19 0332 07/16/19 0409 07/16/19 0743  BP:  93/65  (!) 102/57  Pulse:    77  Resp: 20 (!) 21 16 16   Temp:  97.6 F (36.4 C)  98.4 F (36.9 C)  TempSrc:  Oral  Oral  SpO2:    95%  Weight:  108.9 kg    Height:        Intake/Output Summary (Last 24 hours) at 07/16/2019 [id number] Last data filed at  07/15/2019 1347 Gross per 24 hour  Intake -  Output 2000 ml  Net -2000 ml   Last 3 Weights 07/16/2019 07/15/2019 07/15/2019  Weight (lbs) 240 lb 1.3 oz 238 lb 5.1 oz 244 lb 7.8 oz  Weight (kg) 108.9 kg 108.1 kg 110.9 kg      Telemetry    Sinus rhythm rare PVCs- Personally Reviewed  ECG    Sinus rhythm left bundle branch block- Personally Reviewed  Physical Exam  Morbidly obese, limits the exam GEN: No acute distress.   Neck: No JVD Cardiac: RRR, 2/6 early peaking systolic ejection murmur in the aortic focus no diastolic murmurs, rubs, or gallops.  Respiratory: Clear to auscultation bilaterally. GI: Soft, nontender, non-distended  MS: No edema; No deformity. Neuro:  Nonfocal  Psych: Normal affect   Labs    High Sensitivity Troponin:   Recent Labs  Lab 07/10/19 0918 07/10/19 1118  TROPONINIHS 26* 130*      Chemistry Recent Labs  Lab 07/12/19 1301  07/14/19 0355 07/14/19 0904 07/15/19 0204  NA 132*   < > 134* 134* 133*  K 3.7   < > 5.5* 3.8 4.1  CL 99   < > 96* 95* 97*  CO2 21*   < > 23 23 22   GLUCOSE 95   < >  95 164* 109*  BUN 61*   < > 50* 52* 54*  CREATININE 6.12*   < > 5.41* 5.42* 6.24*  CALCIUM 8.8*   < > 9.2 9.0 9.0  ALBUMIN 3.5  --   --  3.2*  --   GFRNONAA 9*   < > 11* 11* 9*  GFRAA 11*   < > 12* 12* 10*  ANIONGAP 12   < > 15 16* 14   < > = values in this interval not displayed.     Hematology Recent Labs  Lab 07/13/19 1642 07/13/19 2246 07/14/19 0904 07/15/19 1434  WBC 9.8  --  9.0 8.4  RBC 2.66*  --  2.80* 2.92*  HGB 7.6* 7.9* 7.9* 8.2*  HCT 23.8* 25.0* 25.1* 25.8*  MCV 89.5  --  89.6 88.4  MCH 28.6  --  28.2 28.1  MCHC 31.9  --  31.5 31.8  RDW 16.2*  --  16.6* 16.9*  PLT 187  --  184 200    BNP Recent Labs  Lab 07/10/19 0918  BNP 1,468.8*     DDimer No results for input(s): DDIMER in the last 168 hours.   Radiology    No results found.  Cardiac Studies   07/13/2019 cardiac catheterization: Conclusions: 1. Severe  native coronary artery disease, as detailed below. Ostial LAD disease appears stable compared with prior catheterization a year ago. Severe mid/distal LAD disease may be slightly worse but does not supply a significant territory. Distal LMCA, ramus, and LCx disease has not changed significantly since 06/2018. 2. Patent RIMA-RCA. There appears to be at least 50% stenosis of the mid RCA beyond the RIMA anastomosis, though evaluation is limited by competitive flow. Mid/distal RCA stent is patent but may have moderate in-stent restenosis. Distal RCA branches are small and diffusely diseased. 3. Moderately to severely elevated left ventricular filling pressure (LVEDP 30-35 mmHg).  Recommendations: 1. Aggressive medical therapy, including titration of nitroglycerin infusion for relief of chest pain. 2. Continue fluid removal via hemodialysis and diuresis, as elevated LVEDP is likely contributing to angina. 3. Consider PRBC transfusion in the setting of gradually worsening chronic anemia; hemoglobin is down to 7.6 this afternoon. Recommend targeting hemoglobin greater than eight and resolution of symptoms. 4. I would reserve PCI to the mid RCA for refractory symptoms. I do not believe that intervention on the LAD would offer much improvement in symptoms given prior infarct in this territory and diffuse nature of disease.   07/12/2019 echo: 1. Left ventricular ejection fraction, by visual estimation, is 35 to 40%. The left ventricle has moderate to severely decreased function. There is no left ventricular hypertrophy. 2. Mid inferoseptal segment and mid inferior segment are abnormal. 3. Definity contrast agent was given IV to delineate the left ventricular endocardial borders. 4. Abnormal septal motion consistent with left bundle branch block. 5. Left ventricular diastolic parameters are consistent with Grade II diastolic dysfunction (pseudonormalization). 6. Global right ventricle has  normal systolic function.The right ventricular size is moderately enlarged. No increase in right ventricular wall thickness. 7. Left atrial size was severely dilated. 8. Right atrial size was severely dilated. 9. The mitral valve is normal in structure. Mild mitral valve regurgitation. No evidence of mitral stenosis. 10. The tricuspid valve is normal in structure. Tricuspid valve regurgitation moderate-severe. 11. Aortic valve mean gradient measures 22.0 mmHg. 12. Aortic valve peak gradient measures 39.1 mmHg. 13. The aortic valve is normal in structure. Aortic valve regurgitation is not visualized. Moderate aortic valve stenosis.  14. There is Moderate calcification of the aortic valve. 15. There is Moderate thickening of the aortic valve. 16. The pulmonic valve was normal in structure. Pulmonic valve regurgitation is trivial. 17. Mildly elevated pulmonary artery systolic pressure. 18. The inferior vena cava is normal in size with greater than 50% respiratory variability, suggesting right atrial pressure of 3 mmHg.  06/21/2018 echo: - Left ventricle: The cavity size was normal. The estimated ejection fraction was in the range of 55% to 60%. Hypokinesis of the apical inferior, mid-apical inferoseptal, and mid-apical anteroseptal myocardium. Apical akinesis.  Patient Profile     58 y.o. male  with extensive CAD of both native coronaries andbypassgrafts,requiring multiple previous PCI's, presenting with unstable angina/small NSTEMI, moderate aortic stenosis, acute on chronic combined systolic and diastolic heart failure, morbid obesity,OSA,DM, HTN, polycystic kidney diseaserecently declared ESRDstarting hemodialysis.   Repeat cardiac catheterization 07/13/2019 shows anatomy that is not amenable to percutaneous revascularization with stenoses involving both the LAD artery and the right coronary artery at the bypass anastomosis.  It also showed markedly elevated left ventricular  end-diastolic filling pressures despite hemodialysis.   Assessment & Plan    1. CAD s/p NSTEMI: After evaluation by 2 interventional cardiologist the recommendation is for medical therapy. There are no good options for percutaneous revascularization.   Treat decompensated heart failure, anemia, combination antianginal therapy (high-dose beta-blocker, calcium channel blocker, long-acting nitrates).  Currently free of angina at rest.  Consider re-adding ranolazine, although some risk involved in the setting of left bundle branch block-related to prolonged QT interval and end-stage renal disease. Despite its benefit in treatment of congestive heart failure, hydralazine lowers the angina threshold and it is probably better if we can avoid it. 2. [id number] reduction in LVEF documented on this admission, elevated EDP by cardiac catheterization. On hydralazine/nitrates and carvedilol.     Volume management via hemodialysis.  Blood pressure substantially lower after achieving a lower target dry weight. 3. [id number] aortic stenosis, without exertional symptoms prior to this admission. Not yet hemodynamically significant. 4. ESRD: Newly started on hemodialysis 5. Anemia:Receiving erythropoietin analog.Iron stores are replete. 6. HLP:No lipid profile on this admission, roughly 1 year ago LDL 73 on current statin dose. 7.DM:controlled, hemoglobin A1c 6.1% on this admission     [facility] will sign off.   Medication Recommendations:   Hydralazine stopped Ranolazine stopped Isosorbide mononitrate reduced to 60 mg twice daily Continue previous doses of carvedilol and amlodipine, aspirin, clopidogrel, atorvastatin  Furosemide per nephrology recommendations  Other recommendations (labs, testing, etc): As per primary team/nephrology Follow up as an outpatient: We will arrange transition of care follow-up with Dr. Irish Lack in the next 2 weeks  For questions or updates, please  contact [place] Please consult [email or web address] for contact info under        Signed, Sanda Klein, MD  07/16/2019, 9:23 AM

## 2019-07-16 NOTE — Plan of Care (Signed)
  Problem: Education: Goal: Knowledge of General Education information will improve Description: Including pain rating scale, medication(s)/side effects and non-pharmacologic comfort measures Outcome: Progressing   Problem: Health Behavior/Discharge Planning: Goal: Ability to manage health-related needs will improve Outcome: Progressing   Problem: Clinical Measurements: Goal: Ability to maintain clinical measurements within normal limits will improve Outcome: Progressing Goal: Will remain free from infection Outcome: Progressing Goal: Diagnostic test results will improve Outcome: Progressing Goal: Respiratory complications will improve Outcome: Progressing Goal: Cardiovascular complication will be avoided Outcome: Progressing   Problem: Activity: Goal: Risk for activity intolerance will decrease Outcome: Progressing   Problem: Nutrition: Goal: Adequate nutrition will be maintained Outcome: Progressing   Problem: Coping: Goal: Level of anxiety will decrease Outcome: Progressing   Problem: Elimination: Goal: Will not experience complications related to bowel motility Outcome: Progressing Goal: Will not experience complications related to urinary retention Outcome: Progressing   Problem: Pain Managment: Goal: General experience of comfort will improve Outcome: Progressing   Problem: Safety: Goal: Ability to remain free from injury will improve Outcome: Progressing   Problem: Skin Integrity: Goal: Risk for impaired skin integrity will decrease Outcome: Progressing   Problem: Education: Goal: Understanding of CV disease, CV risk reduction, and recovery process will improve Outcome: Progressing Goal: Individualized Educational Video(s) Outcome: Progressing   Problem: Activity: Goal: Ability to return to baseline activity level will improve Outcome: Progressing   Problem: Cardiovascular: Goal: Ability to achieve and maintain adequate cardiovascular perfusion  will improve Outcome: Progressing Goal: Vascular access site(s) Level 0-1 will be maintained Outcome: Progressing   Problem: Health Behavior/Discharge Planning: Goal: Ability to safely manage health-related needs after discharge will improve Outcome: Progressing   Problem: Education: Goal: Understanding of cardiac disease, CV risk reduction, and recovery process will improve Outcome: Progressing Goal: Understanding of medication regimen will improve Outcome: Progressing Goal: Individualized Educational Video(s) Outcome: Progressing   Problem: Activity: Goal: Ability to tolerate increased activity will improve Outcome: Progressing   Problem: Cardiac: Goal: Ability to achieve and maintain adequate cardiopulmonary perfusion will improve Outcome: Progressing Goal: Vascular access site(s) Level 0-1 will be maintained Outcome: Progressing   Problem: Health Behavior/Discharge Planning: Goal: Ability to safely manage health-related needs after discharge will improve Outcome: Progressing   

## 2019-07-16 NOTE — Discharge Summary (Signed)
Physician Discharge Summary  Lj Sliman [id number] DOB: 09-05-1961 DOA: 07/10/2019  PCP: [name], MD  Admit date: 07/10/2019 Discharge date: 07/16/2019  Admitted From: home  Disposition:  Home   Recommendations for Outpatient Follow-up:  1. Follow up with PCP in 1-2 weeks 2. Follow-up at dialysis center tomorrow.  Home Health: Not applicable Equipment/Devices: None  Discharge Condition: Stable CODE STATUS: Full code Diet recommendation: Low-carb diet  Discharge summary: Matthew STEGEMAN Sr. is a 58 y.o.malewith medical history significant ofcombined systolic and diastolic CHF, CAD s/p CABG,chronic anemia, CKD stage IV, diabetes mellitus type 2, OSA on CPAP. He presented for chest pain.  In the emergency room, elevated high-sensitivity troponin XX 6>>> 130.  Creatinine 6.  Cardiology evaluated.  Started on heparin and nitroglycerin.  Initially declined to have cardiac cath, ultimately underwent cardiac cath after developing chest pain with hemodialysis.  Newly started on dialysis.  Admitted and treated as non-ST elevation MI, history of coronary artery disease with CABG and multiple PCI's.  Underwent cardiac cath that showed diffuse coronary artery disease not amenable for stenting.  Treated with nitroglycerin infusion and other medical management.  Currently adequately stabilized.  Blood pressure is low and unable to tolerate multiple other medications.  As per cardiology recommendations patient going home on Aspirin and Plavix, decrease dose of nitroglycerin, Lipitor 80 mg, Ranexa and decreased dose of carvedilol.  He is unable to tolerate amlodipine or hydralazine.  Patient has ejection fraction 35 to 40%.  Currently euvolemic after starting on hemodialysis.  He has chronic anemia and hemoglobin is more than 8.  Will be managed at dialysis.  He did receive 1 unit of PRBC in the hospital and currently remains stable.  More than 8 hemoglobin, no indication for  transfusion.  With his worsening kidney function, patient was started on hemodialysis.  He will stay on dialysis, outpatient arrangements were made and he will report to hemodialysis center starting tomorrow. His diabetes is fairly controlled.  Hemoglobin A1c is 6.1.  Patient will continue to take twice a day 70/30 insulin and Ozempic. Starting on dialysis, his gabapentin dose will be reduced to maximum of 300 mg/day.  Patient is stable.  He has been mobilized.  Going home with new instructions and follow-up at dialysis.  Discharge Diagnoses:  Principal Problem:   NSTEMI (non-ST elevated myocardial infarction) ([place]) Active Problems:   Polycystic kidney disease   OSA on CPAP   Type 2 diabetes with nephropathy (HCC)   CAD- S/P multiple PCIs, CABG   Gout   Anemia due to stage 4 chronic kidney disease ([place])   ESRD (end stage renal disease) [facility])    Discharge Instructions  Discharge Instructions    Call MD for:  persistant dizziness or light-headedness   Complete by: As directed    Diet Carb Modified   Complete by: As directed    Discharge instructions   Complete by: As directed    There are changes in your medications, please follow new doses and medicines. Go to [place] [facility] for dialysis tomorrow.   Increase activity slowly   Complete by: As directed      Allergies as of 07/16/2019      Reactions   Cleocin [clindamycin Hcl] Anaphylaxis, Swelling   Glimepiride [amaryl] Other (See Comments)   Elevates liver function   Clarithromycin Itching, Other (See Comments)   "Biaxin" Eyes itch and burn   Colchicine Other (See Comments)   Affected kidneys   Ciprocin-fluocin-procin [fluocinolone Acetonide] Rash  Medication List    STOP taking these medications   amLODipine 10 MG tablet Commonly known as: NORVASC   furosemide 80 MG tablet Commonly known as: LASIX   hydrALAZINE 50 MG tablet Commonly known as: APRESOLINE     TAKE these medications    acetaminophen 500 MG tablet Commonly known as: TYLENOL Take 500 mg by mouth 2 (two) times daily as needed (for pain or headaches).   allopurinol 100 MG tablet Commonly known as: ZYLOPRIM Take 100 mg by mouth 2 (two) times daily.   aspirin 81 MG EC tablet Take 1 tablet (81 mg total) by mouth daily.   atorvastatin 80 MG tablet Commonly known as: LIPITOR Take 1 tablet (80 mg total) by mouth daily. What changed: when to take this   carvedilol 25 MG tablet Commonly known as: COREG Take 1 tablet (25 mg total) by mouth 2 (two) times daily. What changed: how much to take   Cholecalciferol 50 MCG (2000 UT) Tabs Take 2,000 Units by mouth daily.   clopidogrel 75 MG tablet Commonly known as: PLAVIX TAKE ONE TABLET BY MOUTH ONCE DAILY WITH BREAKFAST What changed: See the new instructions.   diphenhydrAMINE 25 MG tablet Commonly known as: BENADRYL Take 25 mg by mouth every 6 (six) hours as needed for allergies or sleep.   gabapentin 300 MG capsule Commonly known as: NEURONTIN Take 1 capsule (300 mg total) by mouth daily.   insulin aspart protamine- aspart (70-30) 100 UNIT/ML injection Commonly known as: NOVOLOG MIX 70/30 Inject 20 Units into the skin 2 (two) times daily as needed (if blood sugar is over 180).   isosorbide mononitrate 60 MG 24 hr tablet Commonly known as: IMDUR Take 1 tablet (60 mg total) by mouth 2 (two) times daily. What changed:   medication strength  how much to take   Misc. Devices Misc by Does not apply route. C-PAP   multivitamin with minerals Tabs tablet Take 1 tablet by mouth daily.   nitroGLYCERIN 0.4 MG/SPRAY spray Commonly known as: NITROLINGUAL Place 1 spray under the tongue every 5 (five) minutes x 3 doses as needed for chest pain.   nitroGLYCERIN 0.4 MG SL tablet Commonly known as: NITROSTAT Place 1 tablet (0.4 mg total) under the tongue every 5 (five) minutes as needed for chest pain.   OZEMPIC (0.25 OR 0.5 MG/DOSE) [place] Inject 0.25  mg into the skin every Saturday.   pantoprazole 40 MG tablet Commonly known as: PROTONIX Take 40 mg by mouth daily.   ranitidine 300 MG tablet Commonly known as: ZANTAC Take 300 mg by mouth at bedtime.   ranolazine 500 MG 12 hr tablet Commonly known as: Ranexa Take 1 tablet (500 mg total) by mouth 2 (two) times daily.   traMADol 50 MG tablet Commonly known as: ULTRAM Take 1 tablet (50 mg total) by mouth every 12 (twelve) hours as needed for moderate pain.   vitamin E 400 UNIT capsule Take 400 Units by mouth daily.   zolpidem 10 MG tablet Commonly known as: AMBIEN Take 10 mg by mouth at bedtime as needed for sleep.       Allergies  Allergen Reactions  . Cleocin [Clindamycin Hcl] Anaphylaxis and Swelling  . Glimepiride [Amaryl] Other (See Comments)    Elevates liver function   . Clarithromycin Itching and Other (See Comments)    "Biaxin" Eyes itch and burn  . Colchicine Other (See Comments)    Affected kidneys   . Ciprocin-Fluocin-Procin [Fluocinolone Acetonide] Rash    Consultations:  Nephrology for dialysis  Cardiology   Procedures/Studies: Dg Chest 2 View  Result Date: 07/10/2019 CLINICAL DATA:  Chest pain. Swelling in the legs and feet. EXAM: CHEST - 2 VIEW COMPARISON:  06/22/2018 FINDINGS: Stable enlarged cardiac silhouette and post CABG changes. Anterior epicardial pacemaker lead. Coronary artery stents. Mild diffuse peribronchial thickening and accentuation of the interstitial markings with improvement. Normal vasculature. No definite pleural fluid. Unremarkable bones. IMPRESSION: 1. Stable cardiomegaly. 2. Improved changes of congestive heart failure superimposed on mild chronic interstitial lung disease. Electronically Signed   By: Claudie Revering M.D.   On: 07/10/2019 09:49   Dg Abd 2 Views  Result Date: 07/12/2019 CLINICAL DATA:  Abdominal distension EXAM: ABDOMEN - 2 VIEW COMPARISON:  None. FINDINGS: Visualized bowel gas pattern is nonobstructive. No  evidence of soft tissue mass or abnormal fluid collection. No evidence of free intraperitoneal air. No evidence of renal or ureteral calculi. No acute appearing osseous abnormality. IMPRESSION: Nonobstructive bowel gas pattern. No evidence of acute intra-abdominal abnormality. Electronically Signed   By: Franki Cabot M.D.   On: 07/12/2019 11:24    Cardiac cath   Subjective: Patient seen and examined.  No overnight events.  Ambulated around.  Feels some dizziness on getting up walking but no syncopal or presyncopal episode.  Blood pressures remain soft, no change in orthostatic.   Discharge Exam: Vitals:   07/16/19 0409 07/16/19 0743  BP:  (!) 102/57  Pulse:  77  Resp: 16 16  Temp:  98.4 F (36.9 C)  SpO2:  95%   Vitals:   07/16/19 0041 07/16/19 0332 07/16/19 0409 07/16/19 0743  BP:  93/65  (!) 102/57  Pulse:    77  Resp: 20 (!) 21 16 16   Temp:  97.6 F (36.4 C)  98.4 F (36.9 C)  TempSrc:  Oral  Oral  SpO2:    95%  Weight:  108.9 kg    Height:        General: Pt is alert, awake, not in acute distress, on room air.  Walking around the room. Cardiovascular: RRR, S1/S2 +, no rubs, no gallops Respiratory: CTA bilaterally, no wheezing, no rhonchi Abdominal: Soft, NT, ND, bowel sounds +, obese and nontender. Extremities: no edema, no cyanosis, left upper extremity AV fistula with thrill.  Used for hemodialysis in the hospital.    The results of significant diagnostics from this hospitalization (including imaging, microbiology, ancillary and laboratory) are listed below for reference.     Microbiology: Recent Results (from the past 240 hour(s))  SARS Coronavirus 2 by RT PCR (hospital order, performed in [facility] hospital lab) Nasopharyngeal Nasopharyngeal Swab     Status: None   Collection Time: 07/10/19  1:30 PM   Specimen: Nasopharyngeal Swab  Result Value Ref Range Status   SARS Coronavirus 2 NEGATIVE NEGATIVE Final    Comment: (NOTE) If result is  NEGATIVE SARS-CoV-2 target nucleic acids are NOT DETECTED. The SARS-CoV-2 RNA is generally detectable in upper and lower  respiratory specimens during the acute phase of infection. The lowest  concentration of SARS-CoV-2 viral copies this assay can detect is 250  copies / mL. A negative result does not preclude SARS-CoV-2 infection  and should not be used as the sole basis for treatment or other  patient management decisions.  A negative result may occur with  improper specimen collection / handling, submission of specimen other  than nasopharyngeal swab, presence of viral mutation(s) within the  areas targeted by this assay, and inadequate number of  viral copies  (<250 copies / mL). A negative result must be combined with clinical  observations, patient history, and epidemiological information. If result is POSITIVE SARS-CoV-2 target nucleic acids are DETECTED. The SARS-CoV-2 RNA is generally detectable in upper and lower  respiratory specimens dur ing the acute phase of infection.  Positive  results are indicative of active infection with SARS-CoV-2.  Clinical  correlation with patient history and other diagnostic information is  necessary to determine patient infection status.  Positive results do  not rule out bacterial infection or co-infection with other viruses. If result is PRESUMPTIVE POSTIVE SARS-CoV-2 nucleic acids MAY BE PRESENT.   A presumptive positive result was obtained on the submitted specimen  and confirmed on repeat testing.  While 2019 novel coronavirus  (SARS-CoV-2) nucleic acids may be present in the submitted sample  additional confirmatory testing may be necessary for epidemiological  and / or clinical management purposes  to differentiate between  SARS-CoV-2 and other Sarbecovirus currently known to infect humans.  If clinically indicated additional testing with an alternate test  methodology [phone]) is advised. The SARS-CoV-2 RNA is generally  detectable  in upper and lower respiratory sp ecimens during the acute  phase of infection. The expected result is Negative. Fact Sheet for Patients:  StrictlyIdeas.no Fact Sheet for Healthcare Providers: BankingDealers.co.za This test is not yet approved or cleared by the [place] FDA and has been authorized for detection and/or diagnosis of SARS-CoV-2 by FDA under an Emergency Use Authorization (EUA).  This EUA will remain in effect (meaning this test can be used) for the duration of the COVID-19 declaration under Section 564(b)(1) of the Act, 21 U.S.C. section 360bbb-3(b)(1), unless the authorization is terminated or revoked sooner. Performed at [facility], [place] [address] [place], [place] [address]   MRSA PCR Screening     Status: None   Collection Time: 07/13/19  6:11 PM   Specimen: Nasopharyngeal  Result Value Ref Range Status   MRSA by PCR NEGATIVE NEGATIVE Final    Comment:        The GeneXpert MRSA Assay (FDA approved for NASAL specimens only), is one component of a comprehensive MRSA colonization surveillance program. It is not intended to diagnose MRSA infection nor to guide or monitor treatment for MRSA infections. Performed at [facility], [place] [address], [place], [place] [address]      Labs: BNP (last 3 results) Recent Labs    07/10/19 0918  BNP [id number]*   Basic Metabolic Panel: Recent Labs  Lab 07/12/19 1301 07/13/19 0442 07/13/19 1642 07/14/19 0355 07/14/19 0904 07/15/19 0204  NA 132* 133*  --  134* 134* 133*  K 3.7 4.0  --  5.5* 3.8 4.1  CL 99 98  --  96* 95* 97*  CO2 21* 19*  --  23 23 22   GLUCOSE 95 95  --  95 164* 109*  BUN 61* 63*  --  50* 52* 54*  CREATININE 6.12* 6.16* 4.66* 5.41* 5.42* 6.24*  CALCIUM 8.8* 8.9  --  9.2 9.0 9.0  PHOS 4.2  --   --   --  4.3  --    Liver Function Tests: Recent Labs  Lab 07/12/19 1301 07/14/19 0904  ALBUMIN 3.5 3.2*   No results for input(s):  LIPASE, AMYLASE in the last 168 hours. No results for input(s): AMMONIA in the last 168 hours. CBC: Recent Labs  Lab 07/11/19 0040 07/12/19 0414 07/12/19 1301 07/13/19 1642 07/13/19 2246 07/14/19 0904 07/15/19  1434  WBC 8.8 10.1 10.0 9.8  --  9.0 8.4  NEUTROABS 6.9  --   --   --   --   --   --   HGB 8.5* 8.3* 8.3* 7.6* 7.9* 7.9* 8.2*  HCT 26.7* 26.4* 25.1* 23.8* 25.0* 25.1* 25.8*  MCV 90.8 89.8 87.8 89.5  --  89.6 88.4  PLT 185 205 183 187  --  184 200   Cardiac Enzymes: No results for input(s): CKTOTAL, CKMB, CKMBINDEX, TROPONINI in the last 168 hours. BNP: Invalid input(s): POCBNP CBG: Recent Labs  Lab 07/15/19 0615 07/15/19 1649 07/15/19 2118 07/16/19 0553 07/16/19 1051  GLUCAP 102* 132* 155* 107* 99   D-Dimer No results for input(s): DDIMER in the last 72 hours. Hgb A1c No results for input(s): HGBA1C in the last 72 hours. Lipid Profile No results for input(s): CHOL, HDL, LDLCALC, TRIG, CHOLHDL, LDLDIRECT in the last 72 hours. Thyroid function studies No results for input(s): TSH, T4TOTAL, T3FREE, THYROIDAB in the last 72 hours.  Invalid input(s): FREET3 Anemia work up No results for input(s): VITAMINB12, FOLATE, FERRITIN, TIBC, IRON, RETICCTPCT in the last 72 hours. Urinalysis    Component Value Date/Time   COLORURINE STRAW (A) 06/20/2018 2033   APPEARANCEUR CLEAR 06/20/2018 2033   LABSPEC 1.006 06/20/2018 2033   PHURINE 6.0 06/20/2018 2033   GLUCOSEU NEGATIVE 06/20/2018 2033   HGBUR NEGATIVE 06/20/2018 2033   BILIRUBINUR NEGATIVE 06/20/2018 2033   [place] NEGATIVE 06/20/2018 2033   PROTEINUR NEGATIVE 06/20/2018 2033   UROBILINOGEN 1.0 07/17/2015 1425   NITRITE NEGATIVE 06/20/2018 2033   LEUKOCYTESUR NEGATIVE 06/20/2018 2033   Sepsis Labs Invalid input(s): PROCALCITONIN,  WBC,  LACTICIDVEN Microbiology Recent Results (from the past 240 hour(s))  SARS Coronavirus 2 by RT PCR (hospital order, performed in [facility] hospital lab) Nasopharyngeal  Nasopharyngeal Swab     Status: None   Collection Time: 07/10/19  1:30 PM   Specimen: Nasopharyngeal Swab  Result Value Ref Range Status   SARS Coronavirus 2 NEGATIVE NEGATIVE Final    Comment: (NOTE) If result is NEGATIVE SARS-CoV-2 target nucleic acids are NOT DETECTED. The SARS-CoV-2 RNA is generally detectable in upper and lower  respiratory specimens during the acute phase of infection. The lowest  concentration of SARS-CoV-2 viral copies this assay can detect is 250  copies / mL. A negative result does not preclude SARS-CoV-2 infection  and should not be used as the sole basis for treatment or other  patient management decisions.  A negative result may occur with  improper specimen collection / handling, submission of specimen other  than nasopharyngeal swab, presence of viral mutation(s) within the  areas targeted by this assay, and inadequate number of viral copies  (<250 copies / mL). A negative result must be combined with clinical  observations, patient history, and epidemiological information. If result is POSITIVE SARS-CoV-2 target nucleic acids are DETECTED. The SARS-CoV-2 RNA is generally detectable in upper and lower  respiratory specimens dur ing the acute phase of infection.  Positive  results are indicative of active infection with SARS-CoV-2.  Clinical  correlation with patient history and other diagnostic information is  necessary to determine patient infection status.  Positive results do  not rule out bacterial infection or co-infection with other viruses. If result is PRESUMPTIVE POSTIVE SARS-CoV-2 nucleic acids MAY BE PRESENT.   A presumptive positive result was obtained on the submitted specimen  and confirmed on repeat testing.  While 2019 novel coronavirus  (SARS-CoV-2) nucleic acids may be  present in the submitted sample  additional confirmatory testing may be necessary for epidemiological  and / or clinical management purposes  to differentiate between   SARS-CoV-2 and other Sarbecovirus currently known to infect humans.  If clinically indicated additional testing with an alternate test  methodology [phone]) is advised. The SARS-CoV-2 RNA is generally  detectable in upper and lower respiratory sp ecimens during the acute  phase of infection. The expected result is Negative. Fact Sheet for Patients:  StrictlyIdeas.no Fact Sheet for Healthcare Providers: BankingDealers.co.za This test is not yet approved or cleared by the [place] FDA and has been authorized for detection and/or diagnosis of SARS-CoV-2 by FDA under an Emergency Use Authorization (EUA).  This EUA will remain in effect (meaning this test can be used) for the duration of the COVID-19 declaration under Section 564(b)(1) of the Act, 21 U.S.C. section 360bbb-3(b)(1), unless the authorization is terminated or revoked sooner. Performed at [facility], [place] [address] [place], [place] [address]   MRSA PCR Screening     Status: None   Collection Time: 07/13/19  6:11 PM   Specimen: Nasopharyngeal  Result Value Ref Range Status   MRSA by PCR NEGATIVE NEGATIVE Final    Comment:        The GeneXpert MRSA Assay (FDA approved for NASAL specimens only), is one component of a comprehensive MRSA colonization surveillance program. It is not intended to diagnose MRSA infection nor to guide or monitor treatment for MRSA infections. Performed at [facility], [place] [address] [place], [place] [address]      Time coordinating discharge:  45 minutes  SIGNED:   Barb Merino, MD  [facility] 07/16/2019, 11:15 AM

## 2019-07-16 NOTE — TOC Transition Note (Signed)
Transition of Care [facility]) - CM/SW Discharge Note   Patient Details  Name: Matthew RUMMEL Sr. MRN: [id number] Date of Birth: 04/11/1961  Transition of Care [facility]) CM/SW Contact:  Zenon Mayo, RN Phone Number: 07/16/2019, 1:27 PM   Clinical Narrative:    Patient for dc today, NCM offered choice for [facility], he states he does not have a preference,  NCM contacted [facility] for [facility].  Awaiting to hear back from [place].   Final next level of care: [place] Barriers to Discharge: No Barriers Identified   Patient Goals and CMS Choice Patient states their goals for this hospitalization and ongoing recovery are:: go home CMS Medicare.gov Compare Post Acute Care list provided to:: Patient Choice offered to / list presented to : Patient  Discharge Placement                       Discharge Plan and Services In-house Referral: NA Discharge Planning Services: CM Consult Post Acute Care Choice: Home Health          DME Arranged: (NA)         HH Arranged: RN [place] Agency: [facility] (formerly [employer]) Date [place]: 07/16/19 Time [place]: [place] Representative spoke with at [place]: [place] ([place]) Interventions     Readmission Risk Interventions Readmission Risk Prevention Plan 07/16/2019 07/13/2019  Transportation Screening Complete Complete  PCP or Specialist Appt within 3-5 Days Complete Complete  HRI or [place] Complete Complete  Social Work Consult for [place] Planning/Counseling Complete Complete  Palliative Care Screening Complete Not Applicable  Medication Review Press photographer) Complete Complete  Some recent data might be hidden

## 2019-07-16 NOTE — Telephone Encounter (Signed)
Patient states that he has been using the Nitro spray and will continue doing so. Denies any additional concerns at this time. Instructed patient to let us know if his Sx change or worsen.

## 2019-07-16 NOTE — Progress Notes (Signed)
Renal Navigator met with patient at bedside to provide OP HD clinic information verbally and in writing. He reports that he will be discharged today. Therefore, he understands that he needs to arrive to [facility] OP HD clinic tomorrow, 07/17/19 at 10:45am to complete paperwork prior to his first treatment. Patient states his daughter can transport to his first HD treatment. He plans to drive himself once he adjusts. Patient appreciative and agreeable. Patient cleared for discharge from an OP HD standpoint. Renal Navigator discussed with bedside RN.  Alphonzo Cruise, [place] Renal Navigator [phone]

## 2019-07-16 NOTE — Progress Notes (Signed)
[facility] NEPHROLOGY PROGRESS NOTE  Assessment/ Plan: Pt is a 58 y.o. yo male  with history of CHF, CAD status post CABG, DM, OSA, CKD stage IV/V who has left upper extremity AV fistula matured admitted with an NSTEMI.  #New ESRD progressed from CKD 5/polycystic kidney disease: Started dialysis since 10/31.  Developed chest pain during dialysis ON 11/2 and underwent urgent cardiac cath.  -Status post dialysis yesterday with 2000 cc UF, tolerated well.  No problem with AV fistula cannulation.  Outpatient HD arranged at [facility] kidney center MWF starting time 11:50 AM.  Plan for next dialysis tomorrow.  Okay to discharge from renal perspective.  Discussed with primary team.  #NSTEMI: Cardiac cath on 11/2 with severe diffuse LAD and elevated LV pressure.  Cardiology recommended medical management and ultrafiltration during dialysis.  He is chest pain-free today.  Cardiology following.  #Mild hyperkalemia: Improved, now on dialysis.  #Anemia due to CKD: Iron saturation 21%.  Continue IV iron and Aranesp.  #Secondary hyperparathyroidism: PTH 211.  Monitor phosphorus level.  #Hypertension: Blood pressure soft.  I discontinued amlodipine.  Hydralazine on hold by cardiology.  Monitor blood pressure.  Subjective: Seen and examined.  Sitting in bed comfortable.  Reports feeling much better and eager to go home.  Denies nausea vomiting chest pain shortness of breath. Objective Vital signs in last 24 hours: Vitals:   07/16/19 0041 07/16/19 0332 07/16/19 0409 07/16/19 0743  BP:  93/65  (!) 102/57  Pulse:    77  Resp: 20 (!) 21 16 16   Temp:  97.6 F (36.4 C)  98.4 F (36.9 C)  TempSrc:  Oral  Oral  SpO2:    95%  Weight:  108.9 kg    Height:       Weight change: -1.1 kg  Intake/Output Summary (Last 24 hours) at 07/16/2019 0837 Last data filed at 07/15/2019 1347 Gross per 24 hour  Intake -  Output 2000 ml  Net -2000 ml       Labs: Basic Metabolic Panel: Recent Labs   Lab 07/12/19 1301  07/14/19 0355 07/14/19 0904 07/15/19 0204  NA 132*   < > 134* 134* 133*  K 3.7   < > 5.5* 3.8 4.1  CL 99   < > 96* 95* 97*  CO2 21*   < > 23 23 22   GLUCOSE 95   < > 95 164* 109*  BUN 61*   < > 50* 52* 54*  CREATININE 6.12*   < > 5.41* 5.42* 6.24*  CALCIUM 8.8*   < > 9.2 9.0 9.0  PHOS 4.2  --   --  4.3  --    < > = values in this interval not displayed.   Liver Function Tests: Recent Labs  Lab 07/12/19 1301 07/14/19 0904  ALBUMIN 3.5 3.2*   No results for input(s): LIPASE, AMYLASE in the last 168 hours. No results for input(s): AMMONIA in the last 168 hours. CBC: Recent Labs  Lab 07/11/19 0040 07/12/19 0414 07/12/19 1301 07/13/19 1642 07/13/19 2246 07/14/19 0904 07/15/19 1434  WBC 8.8 10.1 10.0 9.8  --  9.0 8.4  NEUTROABS 6.9  --   --   --   --   --   --   HGB 8.5* 8.3* 8.3* 7.6* 7.9* 7.9* 8.2*  HCT 26.7* 26.4* 25.1* 23.8* 25.0* 25.1* 25.8*  MCV 90.8 89.8 87.8 89.5  --  89.6 88.4  PLT 185 205 183 187  --  184 200   Cardiac  Enzymes: No results for input(s): CKTOTAL, CKMB, CKMBINDEX, TROPONINI in the last 168 hours. CBG: Recent Labs  Lab 07/14/19 2134 07/15/19 0615 07/15/19 1649 07/15/19 2118 07/16/19 0553  GLUCAP 130* 102* 132* 155* 107*    Iron Studies: No results for input(s): IRON, TIBC, TRANSFERRIN, FERRITIN in the last 72 hours. Studies/Results: No results found.  Medications: Infusions: . sodium chloride    . sodium chloride    . ferric gluconate (FERRLECIT/NULECIT) IV Stopped (07/14/19 1256)  . nitroGLYCERIN 40 mcg/min (07/13/19 1637)    Scheduled Medications: . allopurinol  100 mg Oral BID  . aspirin  81 mg Oral Daily  . atorvastatin  80 mg Oral QHS  . carvedilol  25 mg Oral BID WC  . Chlorhexidine Gluconate Cloth  6 each Topical Q0600  . Chlorhexidine Gluconate Cloth  6 each Topical Q0600  . clopidogrel  75 mg Oral Daily  . [START ON 07/18/2019] darbepoetin (ARANESP) injection - DIALYSIS  200 mcg Intravenous Q  Sat-HD  . diclofenac sodium  2 g Topical QID  . gabapentin  300 mg Oral Daily  . heparin  5,000 Units Subcutaneous Q8H  . isosorbide mononitrate  60 mg Oral BID  . Living Better with Heart Failure Book   Does not apply Once  . pantoprazole  40 mg Oral Daily  . ranolazine  500 mg Oral BID  . sodium chloride flush  3 mL Intravenous Once  . sodium chloride flush  3 mL Intravenous Q12H  . sodium chloride flush  3 mL Intravenous Q12H    have reviewed scheduled and prn medications.  Physical Exam: General:NAD, comfortable Heart:RRR, s1s2 nl Lungs: Clear b/l, no crackle Abdomen:soft, Non-tender, non-distended Extremities:No edema Dialysis Access: Left AV fistula has good thrill and bruit.  No hematoma noticed.  [place] Cloe Prasad Vincie Linn 07/16/2019,8:37 AM  LOS: 6 days  Pager: ID:[phone]

## 2019-07-16 NOTE — Progress Notes (Signed)
Renal Navigator met with patient at bedside to provide OP HD clinic information verbally and in writing. He reports that he will be discharged today. Therefore, he understands that he needs to arrive to [facility] OP HD clinic tomorrow, 07/17/19 at 10:45am to complete paperwork prior to his first treatment. Patient states his daughter can transport to his first HD treatment. He plans to drive himself once he adjusts. Patient appreciative and agreeable. Patient cleared for discharge from an OP HD standpoint. Renal Navigator discussed with bedside RN. Navigator notified Renal PA for OP HD orders to be sent to clinic.  Alphonzo Cruise, [place] Renal Navigator [phone]

## 2019-07-16 NOTE — Progress Notes (Signed)
Explained and discussed discharge instructions with pt and wife. Prescription meds sent to [place] on [address]. Will go to dialysis tomorrow at centre on [facility]. D/c'd PIV, pt getting dressing going home with wife.

## 2019-07-16 NOTE — Plan of Care (Signed)
Problem: Education: Goal: Knowledge of General Education information will improve Description: Including pain rating scale, medication(s)/side effects and non-pharmacologic comfort measures 07/16/2019 1132 by Don Perking, RN Outcome: Progressing 07/16/2019 1132 by Don Perking, RN Outcome: Progressing   Problem: Health Behavior/Discharge Planning: Goal: Ability to manage health-related needs will improve 07/16/2019 1132 by Don Perking, RN Outcome: Progressing 07/16/2019 1132 by Don Perking, RN Outcome: Progressing   Problem: Clinical Measurements: Goal: Ability to maintain clinical measurements within normal limits will improve 07/16/2019 1132 by Don Perking, RN Outcome: Progressing 07/16/2019 1132 by Don Perking, RN Outcome: Progressing Goal: Will remain free from infection 07/16/2019 1132 by Don Perking, RN Outcome: Progressing 07/16/2019 1132 by Don Perking, RN Outcome: Progressing Goal: Diagnostic test results will improve 07/16/2019 1132 by Don Perking, RN Outcome: Progressing 07/16/2019 1132 by Don Perking, RN Outcome: Progressing Goal: Respiratory complications will improve 07/16/2019 1132 by Don Perking, RN Outcome: Progressing 07/16/2019 1132 by Don Perking, RN Outcome: Progressing Goal: Cardiovascular complication will be avoided 07/16/2019 1132 by Don Perking, RN Outcome: Progressing 07/16/2019 1132 by Don Perking, RN Outcome: Progressing   Problem: Activity: Goal: Risk for activity intolerance will decrease 07/16/2019 1132 by Don Perking, RN Outcome: Progressing 07/16/2019 1132 by Don Perking, RN Outcome: Progressing   Problem: Nutrition: Goal: Adequate nutrition will be maintained 07/16/2019 1132 by Don Perking, RN Outcome: Progressing 07/16/2019 1132 by Don Perking, RN Outcome: Progressing   Problem: Coping: Goal: Level of anxiety will  decrease 07/16/2019 1132 by Don Perking, RN Outcome: Progressing 07/16/2019 1132 by Don Perking, RN Outcome: Progressing   Problem: Elimination: Goal: Will not experience complications related to bowel motility 07/16/2019 1132 by Don Perking, RN Outcome: Progressing 07/16/2019 1132 by Don Perking, RN Outcome: Progressing Goal: Will not experience complications related to urinary retention 07/16/2019 1132 by Don Perking, RN Outcome: Progressing 07/16/2019 1132 by Don Perking, RN Outcome: Progressing   Problem: Pain Managment: Goal: General experience of comfort will improve 07/16/2019 1132 by Don Perking, RN Outcome: Progressing 07/16/2019 1132 by Don Perking, RN Outcome: Progressing   Problem: Safety: Goal: Ability to remain free from injury will improve 07/16/2019 1132 by Don Perking, RN Outcome: Progressing 07/16/2019 1132 by Don Perking, RN Outcome: Progressing   Problem: Skin Integrity: Goal: Risk for impaired skin integrity will decrease 07/16/2019 1132 by Don Perking, RN Outcome: Progressing 07/16/2019 1132 by Don Perking, RN Outcome: Progressing   Problem: Education: Goal: Understanding of CV disease, CV risk reduction, and recovery process will improve 07/16/2019 1132 by Don Perking, RN Outcome: Progressing 07/16/2019 1132 by Don Perking, RN Outcome: Progressing Goal: Individualized Educational Video(s) 07/16/2019 1132 by Don Perking, RN Outcome: Progressing 07/16/2019 1132 by Don Perking, RN Outcome: Progressing   Problem: Activity: Goal: Ability to return to baseline activity level will improve 07/16/2019 1132 by Don Perking, RN Outcome: Progressing 07/16/2019 1132 by Don Perking, RN Outcome: Progressing   Problem: Cardiovascular: Goal: Ability to achieve and maintain adequate cardiovascular perfusion will improve 07/16/2019 1132 by Don Perking, RN Outcome: Progressing 07/16/2019 1132 by Don Perking, RN Outcome: Progressing Goal: Vascular access site(s) Level 0-1 will be maintained 07/16/2019 1132 by Don Perking, RN Outcome: Progressing 07/16/2019 1132 by Don Perking, RN Outcome: Progressing   Problem: Health Behavior/Discharge Planning: Goal: Ability to safely manage health-related needs after  discharge will improve 07/16/2019 1132 by Don Perking, RN Outcome: Progressing 07/16/2019 1132 by Don Perking, RN Outcome: Progressing   Problem: Education: Goal: Understanding of cardiac disease, CV risk reduction, and recovery process will improve 07/16/2019 1132 by Don Perking, RN Outcome: Progressing 07/16/2019 1132 by Don Perking, RN Outcome: Progressing Goal: Understanding of medication regimen will improve 07/16/2019 1132 by Don Perking, RN Outcome: Progressing 07/16/2019 1132 by Don Perking, RN Outcome: Progressing Goal: Individualized Educational Video(s) 07/16/2019 1132 by Don Perking, RN Outcome: Progressing 07/16/2019 1132 by Don Perking, RN Outcome: Progressing   Problem: Activity: Goal: Ability to tolerate increased activity will improve 07/16/2019 1132 by Don Perking, RN Outcome: Progressing 07/16/2019 1132 by Don Perking, RN Outcome: Progressing   Problem: Cardiac: Goal: Ability to achieve and maintain adequate cardiopulmonary perfusion will improve 07/16/2019 1132 by Don Perking, RN Outcome: Progressing 07/16/2019 1132 by Don Perking, RN Outcome: Progressing Goal: Vascular access site(s) Level 0-1 will be maintained 07/16/2019 1132 by Don Perking, RN Outcome: Progressing 07/16/2019 1132 by Don Perking, RN Outcome: Progressing   Problem: Health Behavior/Discharge Planning: Goal: Ability to safely manage health-related needs after discharge will improve 07/16/2019 1132 by Don Perking, RN Outcome: Progressing 07/16/2019 1132 by Don Perking, RN Outcome: Progressing   Problem: Education: Goal: Knowledge of General Education information will improve Description: Including pain rating scale, medication(s)/side effects and non-pharmacologic comfort measures Outcome: Progressing   Problem: Health Behavior/Discharge Planning: Goal: Ability to manage health-related needs will improve Outcome: Progressing   Problem: Clinical Measurements: Goal: Ability to maintain clinical measurements within normal limits will improve Outcome: Progressing Goal: Will remain free from infection Outcome: Progressing Goal: Diagnostic test results will improve Outcome: Progressing Goal: Respiratory complications will improve Outcome: Progressing Goal: Cardiovascular complication will be avoided Outcome: Progressing   Problem: Activity: Goal: Risk for activity intolerance will decrease Outcome: Progressing   Problem: Nutrition: Goal: Adequate nutrition will be maintained Outcome: Progressing   Problem: Coping: Goal: Level of anxiety will decrease Outcome: Progressing   Problem: Elimination: Goal: Will not experience complications related to bowel motility Outcome: Progressing Goal: Will not experience complications related to urinary retention Outcome: Progressing   Problem: Pain Managment: Goal: General experience of comfort will improve Outcome: Progressing   Problem: Safety: Goal: Ability to remain free from injury will improve Outcome: Progressing   Problem: Skin Integrity: Goal: Risk for impaired skin integrity will decrease Outcome: Progressing

## 2019-07-16 NOTE — TOC Initial Note (Addendum)
Transition of Care (TOC) - Initial/Assessment Note    Patient Details  Name: Matthew ATER Sr. MRN: [id number] Date of Birth: [birth year]  Transition of Care [facility]) CM/SW Contact:    Zenon Mayo, RN Phone Number: 07/16/2019, 1:32 PM  Clinical Narrative:                 Patient for dc today, NCM offered choice for [facility], he states he does not have a preference,  NCM contacted [facility] for [facility].  Awaiting to hear back from [place].  She states they can take patient and soc on Sunday.    Expected Discharge Plan: [place] Barriers to Discharge: No Barriers Identified   Patient Goals and CMS Choice Patient states their goals for this hospitalization and ongoing recovery are:: home CMS Medicare.gov Compare Post Acute Care list provided to:: Patient Choice offered to / list presented to : Patient  Expected Discharge Plan and Services Expected Discharge Plan: [place] In-house Referral: NA Discharge Planning Services: CM Consult Post Acute Care Choice: [place] arrangements for the past 2 months: Single Family Home Expected Discharge Date: 07/16/19               DME Arranged: (NA)         HH Arranged: RN [place] Agency: [facility] [facility] [employer] Date [place]: 07/16/19 Time [place]: 78 Representative spoke with at [place]: [place] Arrangements/Services Living arrangements for the past 2 months: [place] Lives with:: Spouse Patient language and need for interpreter reviewed:: Yes Do you feel safe going back to the place where you live?: Yes      Need for Family Participation in Patient Care: Yes (Comment) Care giver support system in place?: Yes (comment)   Criminal Activity/Legal Involvement Pertinent to Current Situation/Hospitalization: No - Comment as needed  Activities of Daily Living Home Assistive Devices/Equipment: None ADL Screening (condition  at time of admission) Patient's cognitive ability adequate to safely complete daily activities?: Yes Is the patient deaf or have difficulty hearing?: No Does the patient have difficulty seeing, even when wearing glasses/contacts?: No Does the patient have difficulty concentrating, remembering, or making decisions?: No Patient able to express need for assistance with ADLs?: Yes Does the patient have difficulty dressing or bathing?: No Independently performs ADLs?: Yes (appropriate for developmental age) Does the patient have difficulty walking or climbing stairs?: No Weakness of Legs: None Weakness of Arms/Hands: None  Permission Sought/Granted Permission sought to share information with : Family Supports                Emotional Assessment Appearance:: Appears stated age Attitude/Demeanor/Rapport: Engaged Affect (typically observed): Appropriate Orientation: : Oriented to  Time, Oriented to Situation, Oriented to Place, Oriented to Self Alcohol / Substance Use: Not Applicable Psych Involvement: No (comment)  Admission diagnosis:  Unstable angina ([place]) [I20.0] Elevated troponin [R77.8] Patient Active Problem List   Diagnosis Date Noted  . NSTEMI (non-ST elevated myocardial infarction) ([place]) 07/10/2019  . ESRD (end stage renal disease) ([employer]) 07/10/2019  . Coronary artery disease involving native heart with unstable angina pectoris ([place])   . Acute respiratory failure ([place])   . Acute diastolic (congestive) heart failure ([place])   . Unstable angina ([place]) 06/18/2018  . Leukocytosis 06/18/2018  . Chronic pain 06/18/2018  . Insomnia 06/18/2018  . Chest pain 06/17/2018  . Chronic heart failure with preserved ejection fraction ([place]) 06/06/2018  .  Anemia due to stage 4 chronic kidney disease ([place]) 02/12/2018  . Coronary artery disease of native heart with stable angina pectoris ([place]) 10/25/2017  . Chronic diastolic heart failure ([place]) 10/25/2017  . Chronic anemia 10/15/2017  .  Elevated troponin 10/15/2017  . CKD (chronic kidney disease) stage 4, GFR 15-29 ml/min (HCC) 07/22/2017  . Hyperkalemia 04/01/2017  . Non-ST elevation (NSTEMI) myocardial infarction ([place]) 04/01/2017  . Chest pain with moderate risk of acute coronary syndrome 04/01/2017  . Hx of CABG   . Ischemic cardiomyopathy 02/21/2017  . HLD (hyperlipidemia)   . GERD (gastroesophageal reflux disease) 05/08/2015  . Gout 05/08/2015  . MI (myocardial infarction) ([place]) 04/19/2015  . CAD- S/P multiple PCIs, CABG   . Hypertensive heart disease   . Precordial chest pain 11/27/2013  . Financial difficulty 03/13/2013  . Medically noncompliant 03/13/2013  . Type 2 diabetes with nephropathy ([place])   . Obesity-BMI 42 11/27/2012  . Polycystic kidney disease 09/15/2012  . OSA on CPAP 09/15/2012  . Hypertension 03/27/2011  . Hyperlipidemia associated with type 2 diabetes mellitus ([place])    PCP:  [name], MD Pharmacy:   [place], [place] - [address] [place] [place] [address] Phone: [phone] Fax: [facility] [place], [place] [address] Phone: [phone] Fax: [phone]     Social Determinants of Health (SDOH) Interventions    Readmission Risk Interventions Readmission Risk Prevention Plan 07/16/2019 07/13/2019  Transportation Screening Complete Complete  PCP or Specialist Appt within 3-5 Days Complete Complete  HRI or [place] Complete Complete  Social Work Consult for [place] Planning/Counseling Complete Complete  Palliative Care Screening Complete Not Applicable  Medication Review Press photographer) Complete Complete  Some recent data might be hidden

## 2019-07-17 ENCOUNTER — Telehealth: Payer: Self-pay

## 2019-07-17 NOTE — Telephone Encounter (Signed)
-----   Message from [place], PA-C sent at 07/16/2019 11:40 AM EST ----- Regarding: [facility] follow-up and call Hi, this patient is being discharged with NSTEMI. He has multiple comorbidities. A TOC appointment has been scheduled. Can a TOC phone call please be scheduled?  Thanks, [place]

## 2019-07-17 NOTE — Telephone Encounter (Signed)
Attempted to contact patient's wife but there was no answer and VM did not pick up.

## 2019-07-17 NOTE — Telephone Encounter (Signed)
**Note De-Identified Matthew Rowe Obfuscation** Patients wife Matthew Rowe contacted regarding the pts discharge from [facility] on 07/16/2019 because there was no ans on the pts phone and his VM was full so I could not leave a message. I called his wife Matthew Rowe ([place]) at [phone].  Matthew Rowe and the pt is refusing the follow up that is scheduled with provider Richardson Dopp, PA-c on 08/03/2019 at 1:20 at [facility] in [place], [place] for the following reasons:  1.They were unaware of this appt and would never have accecpted it as the pt has dialysis on Mondays, Wednesdays, and Fridays. 2. Only wants to f/u with  Dr Irish Lack or Dr End and no one else.  The pt has a 6 month appt scheduled with Dr Irish Lack on 09/21/2018 but is aware that he needs a hosp f/u before then.  Matthew Rowe  understands the pts discharge instructions? Yes  Matthew Rowe understands medications and regiment? Matthew Rowe states that she needs instructions on what to do about the pts blood pressure (if it goes up) on days that he does not have dialysis as he is no longer taking BP meds.  Matthew Rowe  understands to bring all medications to this visit? I did advise and she states that they will once the appt has been changed to a Tuesday or Thursday with Dr Irish Lack or Dr End.

## 2019-07-23 NOTE — Telephone Encounter (Signed)
Called and spoke to patient and his wife. They want to cancel appt with Richardson Dopp, PA as the patient has dialysis on MWF and only wants to reschedule appt with Dr. Irish Lack on a Tuesday or Thursday. Next available appt was 12/1. Appointment made they do not have any other concerns that they need addressed at this time and will call if this changes.

## 2019-08-03 ENCOUNTER — Ambulatory Visit: Payer: Medicare HMO | Admitting: Physician Assistant

## 2019-08-10 ENCOUNTER — Telehealth: Payer: Self-pay | Admitting: Interventional Cardiology

## 2019-08-10 NOTE — Telephone Encounter (Signed)
New message:    Patient wife calling stating that patient had a covid test and it was negative patient just has a small cough. Patient wife just wanted the doctor to know. Please call back if any questions.

## 2019-08-10 NOTE — Telephone Encounter (Signed)
Called and spoke to patient. He states that he has a small cough, but was tested for Covid and it was negative. Denies fever, sob, or any other Sx at this time. The patient will let us know if his Sx change. Wife will accompany patient at visit tomorrow.

## 2019-08-10 NOTE — Progress Notes (Signed)
Cardiology Office Note   Date:  08/11/2019   ID:  Matthew Comito., DOB June 15, 1961, MRN [id number]  PCP:  [name], MD    No chief complaint on file.  CAD  Wt Readings from Last 3 Encounters:  08/11/19 231 lb 1.9 oz (104.8 kg)  07/16/19 240 lb 1.3 oz (108.9 kg)  08/28/18 224 lb 3.2 oz (101.7 kg)       History of Present Illness: Matthew Rowe. is a 58 y.o. 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   In October 2019, he had an episode of chest discomfort when he went to the [facility].  It was 10 out of 10 while he was having a stress test done.  Left heart catheterization was done showing complex LAD disease.  Given that his anterior wall was thought to be partially nonviable due to prior infarction, and treating the LAD stenosis would JL a large ramus territory which was viable, medical therapy was opted for.  He has done well with this.  Now on dialysis.  He had CP with dialysis in 07/2019.  Cath showed moderate RCA disease, which appeared unchanged.  It was likely the only culprit and was difficult to see due to competitive flow.  Since the last visit, he has had some occasional chest pain.  He has used SL NTG on occasion.    He thinks the angiogram in November "destroyed" his kidneys.  I explained that his kidney disease progressed to the point of needing dialysis, and the angiogram was done after.  He still makes during 3x/day.   He reports a cough.  Better when he is using CPAP.      Past Medical History:  Diagnosis Date  . Acute on chronic combined systolic and diastolic CHF (congestive heart failure) ([place]) 10/15/2017  . Chronic anemia   . Chronic combined  systolic and diastolic CHF (congestive heart failure) ([place])   . Chronic stable angina ([place])   . CKD (chronic kidney disease), stage IV ([place])   . Coronary artery disease    a. INF MI 1999: PCI of RCA;  b.  extensive stenting of LAD;  c. s/p CABG with RIMA->RCA in 2006;  d. Stable, Low risk MV 05/2012; e. 09/2012 Cath: stable anatomy->med Rx. f. Abnl nuc 11/2013 -> med rx; g. 11/2013 Cath/PCI: s/p DES to RCA. h. Abnormal nuc 11/2016, pt declined cath.   . DM2 (diabetes mellitus, type 2) ([facility])    Type II  . Dyslipidemia   . GERD (gastroesophageal reflux disease)   . History of gout   . Hypertension   . Hypertensive heart disease   . Ischemic cardiomyopathy   . Mild aortic stenosis   . Myocardial infarction [facility]) 1999; 2002; 2003; 2006; ~ 2008  . OSA on CPAP    wears CPAP  . Polycystic kidney disease     Past Surgical History:  Procedure Laterality Date  . AV FISTULA PLACEMENT Left 05/29/2018   Procedure: ARTERIOVENOUS (AV) FISTULA CREATION  LEFT ARM.;  Surgeon: Serafina Mitchell, MD;  Location: [facility];  Service: Vascular;  Laterality: Left;  . BASCILIC VEIN TRANSPOSITION Left 07/24/2018   Procedure: SECOND STAGE BASCILIC VEIN TRANSPOSITION LEFT ARM;  Surgeon: Serafina Mitchell,  MD;  Location: [facility];  Service: Vascular;  Laterality: Left;  . CARDIAC CATHETERIZATION  2012  . CARDIAC CATHETERIZATION  2013  . CARDIAC CATHETERIZATION  2014   LHC (1/14): total occlusion of distal LAD, diffuse disease in ramus, 70% proximal LCx, 50% proximal and mid RCA, 50% distal RCA, patent RIMA-RCA.  Marland Kitchen CORONARY ANGIOPLASTY WITH STENT PLACEMENT  1999 / 2002 / 2004 / 2006?  . CORONARY ARTERY BYPASS GRAFT  2006   CABG X?; in [place]  . CYSTECTOMY  1990's   off face  . HERNIA REPAIR     inguinal hernia  . LEFT HEART CATH AND CORONARY ANGIOGRAPHY N/A 07/13/2019   Procedure: LEFT HEART CATH AND CORONARY ANGIOGRAPHY;  Surgeon: Nelva Bush, MD;  Location: [facility] CV LAB;  Service: Cardiovascular;  Laterality:  N/A;  . LEFT HEART CATHETERIZATION WITH CORONARY/GRAFT ANGIOGRAM N/A 09/16/2012   Procedure: LEFT HEART CATHETERIZATION WITH [name];  Surgeon: [name], MD;  Location: [facility] CATH LAB;  Service: Cardiovascular;  Laterality: N/A;  . LEFT HEART CATHETERIZATION WITH CORONARY/GRAFT ANGIOGRAM N/A 12/02/2013   Procedure: LEFT HEART CATHETERIZATION WITH [name];  Surgeon: [name], MD;  Location: [facility] [facility] CATH LAB;  Service: Cardiovascular;  Laterality: N/A;  . PERCUTANEOUS CORONARY STENT INTERVENTION (PCI-S) N/A 12/03/2013   Procedure: PERCUTANEOUS CORONARY STENT INTERVENTION (PCI-S);  Surgeon: [name], MD;  Location: [facility] CATH LAB;  Service: Cardiovascular;  Laterality: N/A;  . RIGHT/LEFT HEART CATH AND CORONARY/GRAFT ANGIOGRAPHY N/A 06/23/2018   Procedure: RIGHT/LEFT HEART CATH AND CORONARY/GRAFT ANGIOGRAPHY;  Surgeon: [name], MD;  Location: [facility] CV LAB;  Service: Cardiovascular;  Laterality: N/A;     Current Outpatient Medications  Medication Sig Dispense Refill  . acetaminophen (TYLENOL) 500 MG tablet Take 500 mg by mouth 2 (two) times daily as needed (for pain or headaches).     Marland Kitchen allopurinol (ZYLOPRIM) 100 MG tablet Take 50 mg by mouth 2 (two) times daily.     Marland Kitchen atorvastatin (LIPITOR) 80 MG tablet Take 1 tablet (80 mg total) by mouth daily. (Patient taking differently: Take 40 mg by mouth at bedtime. ) 90 tablet 1  . carvedilol (COREG) 25 MG tablet Take 1 tablet (25 mg total) by mouth 2 (two) times daily. (Patient taking differently: Take 12.5 mg by mouth 2 (two) times daily. ) 120 tablet 0  . Cholecalciferol 2000 units TABS Take 2,000 Units by mouth daily.     . clopidogrel (PLAVIX) 75 MG tablet TAKE ONE TABLET BY MOUTH ONCE DAILY WITH BREAKFAST 90 tablet 1  . diphenhydrAMINE (BENADRYL) 25 MG tablet Take 25 mg by mouth every 6 (six) hours as needed for allergies or sleep.     Marland Kitchen gabapentin (NEURONTIN) 300 MG capsule Take 1 capsule  (300 mg total) by mouth daily. 30 capsule 0  . insulin aspart protamine- aspart (NOVOLOG MIX 70/30) (70-30) 100 UNIT/ML injection Inject 20 Units into the skin 2 (two) times daily as needed (if blood sugar is over 180).    . isosorbide mononitrate (IMDUR) 60 MG 24 hr tablet Take 1 tablet (60 mg total) by mouth 2 (two) times daily. (Patient taking differently: Take 60 mg by mouth daily. ) 60 tablet 0  . Misc. Devices MISC by Does not apply route. C-PAP    . Multiple Vitamin (MULTIVITAMIN WITH MINERALS) TABS tablet Take 1 tablet by mouth daily.    . nitroGLYCERIN (NITROLINGUAL) 0.4 MG/SPRAY spray Place 1 spray under the tongue every 5 (five) minutes x 3 doses  as needed for chest pain.    . nitroGLYCERIN (NITROSTAT) 0.4 MG SL tablet Place 1 tablet (0.4 mg total) under the tongue every 5 (five) minutes as needed for chest pain. 25 tablet 5  . pantoprazole (PROTONIX) 40 MG tablet Take 40 mg by mouth daily.     . ranitidine (ZANTAC) 300 MG tablet Take 300 mg by mouth at bedtime.    . ranolazine (RANEXA) 500 MG 12 hr tablet Take 1 tablet (500 mg total) by mouth 2 (two) times daily. 180 tablet 3  . Semaglutide (OZEMPIC, 0.25 OR 0.5 MG/DOSE, [place]) Inject 0.25 mg into the skin every Saturday.    . traMADol (ULTRAM) 50 MG tablet Take 1 tablet (50 mg total) by mouth every 12 (twelve) hours as needed for moderate pain. 12 tablet 0  . vitamin E 400 UNIT capsule Take 400 Units by mouth daily.    Marland Kitchen zolpidem (AMBIEN) 10 MG tablet Take 10 mg by mouth at bedtime as needed for sleep.      No current facility-administered medications for this visit.     Allergies:   Cleocin [clindamycin hcl], Glimepiride [amaryl], Clarithromycin, Colchicine, and Ciprocin-fluocin-procin [fluocinolone acetonide]    Social History:  The patient  reports that he has quit smoking. His smoking use included cigarettes. He has never used smokeless tobacco. He reports that he does not drink alcohol or use drugs.   Family History:  The  patient's family history includes Anemia in his mother; Diabetes in his brother, father, sister, and sister; Heart attack in his sister; Heart attack (age of onset: 82) in his father; Heart failure in his father; Hyperlipidemia in his father; Hypertension in his father and sister; Kidney failure (age of onset: 59) in his sister; Lung cancer in his mother; Polycystic kidney disease in his brother and mother.    ROS:  Please see the history of present illness.   Otherwise, review of systems are positive for cough.   All other systems are reviewed and negative.    PHYSICAL EXAM: VS:  BP 118/68   Pulse 87   Ht 5' 5.5" (1.664 m)   Wt 231 lb 1.9 oz (104.8 kg)   SpO2 98%   BMI 37.88 kg/m  , BMI Body mass index is 37.88 kg/m. GEN: Well nourished, well developed, in no acute distress  HEENT: normal  Neck: no JVD, carotid bruits, or masses Cardiac: RRR; no murmurs, rubs, or gallops,no edema  Respiratory:  clear to auscultation bilaterally, normal work of breathing GI: soft, nontender, nondistended, + BS, obese, no fluid wave MS: no deformity or atrophy  Skin: warm and dry, no rash Neuro:  Strength and sensation are intact Psych: euthymic mood, full affect   Recent Labs: 07/10/2019: B Natriuretic Peptide 1,468.8 07/15/2019: BUN 54; Creatinine, Ser 6.24; Hemoglobin 8.2; Platelets 200; Potassium 4.1; Sodium 133   Lipid Panel    Component Value Date/Time   CHOL 128 06/25/2018 0326   CHOL 161 11/12/2016 1341   TRIG 101 06/25/2018 0326   HDL 35 (L) 06/25/2018 0326   HDL 31 (L) 11/12/2016 1341   CHOLHDL 3.7 06/25/2018 0326   VLDL 20 06/25/2018 0326   LDLCALC 73 06/25/2018 0326   LDLCALC 86 11/12/2016 1341   LDLDIRECT 72.0 11/03/2014 0923     Other studies Reviewed: Additional studies/ records that were reviewed today with results demonstrating: .   ASSESSMENT AND PLAN:  1. CAD: Medical therapy for now.  If he had refractory anina, would plan for cath with possible  PCI of RCA, likely  via the [facility].  2. ESRD: MWF dialysis.  3. Hyperlipidemia: COntinue lipid lowering therapy.  4. DM: A1C 6.1.  COntinue healthy diet.    Current medicines are reviewed at length with the patient today.  The patient concerns regarding his medicines were addressed.  The following changes have been made:  No change  Labs/ tests ordered today include:  No orders of the defined types were placed in this encounter.   Recommend 150 minutes/week of aerobic exercise Low fat, low carb, high fiber diet recommended  Disposition:   FU in 6 months   Signed, Larae Grooms, MD  08/11/2019 4:08 PM    [facility] [address], [place], [place]  [address] Phone: [phone]; Fax: [phone]

## 2019-08-11 ENCOUNTER — Other Ambulatory Visit: Payer: Self-pay

## 2019-08-11 ENCOUNTER — Ambulatory Visit (INDEPENDENT_AMBULATORY_CARE_PROVIDER_SITE_OTHER): Payer: Medicare HMO | Admitting: Interventional Cardiology

## 2019-08-11 ENCOUNTER — Encounter: Payer: Self-pay | Admitting: Interventional Cardiology

## 2019-08-11 VITALS — BP 118/68 | HR 87 | Ht 65.5 in | Wt 231.1 lb

## 2019-08-11 DIAGNOSIS — N186 End stage renal disease: Secondary | ICD-10-CM | POA: Diagnosis not present

## 2019-08-11 DIAGNOSIS — I25118 Atherosclerotic heart disease of native coronary artery with other forms of angina pectoris: Secondary | ICD-10-CM

## 2019-08-11 DIAGNOSIS — E1159 Type 2 diabetes mellitus with other circulatory complications: Secondary | ICD-10-CM | POA: Diagnosis not present

## 2019-08-11 DIAGNOSIS — E785 Hyperlipidemia, unspecified: Secondary | ICD-10-CM | POA: Diagnosis not present

## 2019-08-11 NOTE — Patient Instructions (Signed)
Medication Instructions:  Your physician recommends that you continue on your current medications as directed. Please refer to the Current Medication list given to you today.  *If you need a refill on your cardiac medications before your next appointment, please call your pharmacy*  Lab Work: None ordered  If you have labs (blood work) drawn today and your tests are completely normal, you will receive your results only by: . MyChart Message (if you have MyChart) OR . A paper copy in the mail If you have any lab test that is abnormal or we need to change your treatment, we will call you to review the results.  Testing/Procedures: None ordered  Follow-Up: At [facility], you and your health needs are our priority.  As part of our continuing mission to provide you with exceptional heart care, we have created designated Provider Care Teams.  These Care Teams include your primary Cardiologist (physician) and Advanced Practice Providers (APPs -  Physician Assistants and Nurse Practitioners) who all work together to provide you with the care you need, when you need it.  Your next appointment:   6 month(s)  The format for your next appointment:   In Person  Provider:   You may see Jayadeep Varanasi, MD or one of the following Advanced Practice Providers on your designated Care Team:    Dayna Dunn, PA-C  Michele Lenze, PA-C   Other Instructions   

## 2019-09-17 ENCOUNTER — Other Ambulatory Visit: Payer: Self-pay | Admitting: Internal Medicine

## 2019-09-17 NOTE — Telephone Encounter (Signed)
Please review for refill. Thanks!  

## 2019-09-22 ENCOUNTER — Telehealth: Payer: Medicare HMO | Admitting: Interventional Cardiology

## 2019-11-27 ENCOUNTER — Telehealth: Payer: Self-pay | Admitting: Interventional Cardiology

## 2019-11-27 ENCOUNTER — Encounter: Payer: Self-pay | Admitting: Nurse Practitioner

## 2019-11-27 NOTE — Telephone Encounter (Signed)
Received call from patient and his wife about patient's chest pain after dialysis. Patient reports I may speak to his wife. I advised we will need to get DPR signed when they return to the office.  Patient reports that he has chest discomfort every M, W, F after dialysis. Reports it does not occur at other times and resolves after receiving oxygen at dialysis center. He does not have discomfort at present. He was taken off his antihypertensive medications once he started dialysis but confirms compliance with taking carvedilol and ranolazine BID and isosorbide mononitrate in the mornings. He does not know HR and BP after dialysis but this morning his SBP at home was 126 mmHg. I advised that I will route message to Dr. Irish Lack for advice regarding any changes that might be needed in the timing of his medications. I advised that Dr. Irish Lack is working in the hospital today and call may not be returned until Monday. Patient and his wife were appreciative of the help and agreeable to plan.

## 2019-11-27 NOTE — Telephone Encounter (Signed)
   Pt's wife calling, she said, every time pt's going to dialysis, he gets chest pain and his BP goes down. She wanted to discuss it with Dr. Irish Lack or his nurse to see if it's related to his medications.  Please call

## 2019-11-27 NOTE — Telephone Encounter (Signed)
Dr. Jimmy Footman calling to speak with Dr. Irish Lack about the patient. Will route his number to Dr. Irish Lack.

## 2019-11-27 NOTE — Telephone Encounter (Signed)
Left detailed message for wife that I am returning her call. I advised in the message that we need permission from the patient to speak with her and advised I am happy to help them if they could call back when they are together since we do not have DPR on file.

## 2019-11-27 NOTE — Telephone Encounter (Signed)
Reviewed Dr. Hassell Done advice with patient and his wife. The patient states he is in agreement and that the oxygen does help with the chest discomfort. They request a note to be sent to [facility] on [facility]. I verified location and phone number and placed note in HIM for fax. Patient and wife thanked me for the call.

## 2019-11-27 NOTE — Telephone Encounter (Signed)
Since he does well on his nondialysis days, I would like to avoid changing meds.  Can he wear oxygen during his dialysis treatments and see if this helps?  That may be the simplest solution.

## 2019-12-01 ENCOUNTER — Telehealth: Payer: Self-pay | Admitting: Interventional Cardiology

## 2019-12-01 MED ORDER — CARVEDILOL 25 MG PO TABS: 25.0000 mg | ORAL_TABLET | Freq: Two times a day (BID) | ORAL | 3 refills | Status: AC

## 2019-12-01 NOTE — Telephone Encounter (Signed)
Called and spoke to patient earlier. Let him know that Dr. Irish Lack did not want to change any of his medicines at this time. He wanted him to try and wear O2 during dialysis. Patient verbalized understanding and thanked me for the call.

## 2019-12-01 NOTE — Telephone Encounter (Signed)
Patient's wife called in because she states her husband needs clarification on how to take his morning medication due to having dialysis. Wife requested nurse to call patient instead of her.

## 2019-12-01 NOTE — Telephone Encounter (Signed)
Called and spoke to patient. He states that he had been using his NTG twice a week x1 with relief. He states that was only having chest discomfort during dialysis. He states that he used )2 yesterday during dialysis and did not have any chest pain. He states that his SBP gets as low as 70 on his dialysis days.  Discussed with Dr. Irish Lack and we will hold off on increasing his ranexa for now. Patient will hold the AM dose of carvedilol on hid dialysis days. Patient made aware and verbalized understanding. He will let us know if his Sx change or worsen.

## 2019-12-01 NOTE — Telephone Encounter (Signed)
Spoke to Dr. Jimmy Footman who states the patient is having daily angina.    Would be curious to know how often he is using SL NTG.    Can increase Ranexa to 1000 mg BID.  Also, I told Dr. Jimmy Footman about using oxygen during the entire dialysis treatment.    JV

## 2019-12-04 ENCOUNTER — Other Ambulatory Visit: Payer: Self-pay

## 2019-12-04 ENCOUNTER — Encounter (HOSPITAL_COMMUNITY): Payer: Self-pay | Admitting: Emergency Medicine

## 2019-12-04 ENCOUNTER — Emergency Department (HOSPITAL_COMMUNITY)
Admission: EM | Admit: 2019-12-04 | Discharge: 2019-12-04 | Disposition: A | Discharge status: Home / Self Care | Payer: No Typology Code available for payment source | Attending: Emergency Medicine | Admitting: Emergency Medicine

## 2019-12-04 ENCOUNTER — Emergency Department (HOSPITAL_COMMUNITY): Payer: No Typology Code available for payment source

## 2019-12-04 DIAGNOSIS — Z87891 Personal history of nicotine dependence: Secondary | ICD-10-CM | POA: Insufficient documentation

## 2019-12-04 DIAGNOSIS — Z79899 Other long term (current) drug therapy: Secondary | ICD-10-CM | POA: Insufficient documentation

## 2019-12-04 DIAGNOSIS — I130 Hypertensive heart and chronic kidney disease with heart failure and stage 1 through stage 4 chronic kidney disease, or unspecified chronic kidney disease: Secondary | ICD-10-CM | POA: Diagnosis not present

## 2019-12-04 DIAGNOSIS — R079 Chest pain, unspecified: Secondary | ICD-10-CM | POA: Diagnosis present

## 2019-12-04 DIAGNOSIS — Z955 Presence of coronary angioplasty implant and graft: Secondary | ICD-10-CM | POA: Diagnosis not present

## 2019-12-04 DIAGNOSIS — Z7902 Long term (current) use of antithrombotics/antiplatelets: Secondary | ICD-10-CM | POA: Diagnosis not present

## 2019-12-04 DIAGNOSIS — I2510 Atherosclerotic heart disease of native coronary artery without angina pectoris: Secondary | ICD-10-CM | POA: Insufficient documentation

## 2019-12-04 DIAGNOSIS — N184 Chronic kidney disease, stage 4 (severe): Secondary | ICD-10-CM | POA: Diagnosis not present

## 2019-12-04 DIAGNOSIS — E1122 Type 2 diabetes mellitus with diabetic chronic kidney disease: Secondary | ICD-10-CM | POA: Diagnosis not present

## 2019-12-04 DIAGNOSIS — I5042 Chronic combined systolic (congestive) and diastolic (congestive) heart failure: Secondary | ICD-10-CM | POA: Insufficient documentation

## 2019-12-04 DIAGNOSIS — I252 Old myocardial infarction: Secondary | ICD-10-CM | POA: Insufficient documentation

## 2019-12-04 DIAGNOSIS — Z951 Presence of aortocoronary bypass graft: Secondary | ICD-10-CM | POA: Diagnosis not present

## 2019-12-04 DIAGNOSIS — Z794 Long term (current) use of insulin: Secondary | ICD-10-CM | POA: Insufficient documentation

## 2019-12-04 LAB — CBC
HCT: 34.9 % — ABNORMAL LOW (ref 39.0–52.0)
Hemoglobin: 11.1 g/dL — ABNORMAL LOW (ref 13.0–17.0)
MCH: 26.7 pg (ref 26.0–34.0)
MCHC: 31.8 g/dL (ref 30.0–36.0)
MCV: 84.1 fL (ref 80.0–100.0)
Platelets: 113 10*3/uL — ABNORMAL LOW (ref 150–400)
RBC: 4.15 MIL/uL — ABNORMAL LOW (ref 4.22–5.81)
RDW: 20.2 % — ABNORMAL HIGH (ref 11.5–15.5)
WBC: 5.4 10*3/uL (ref 4.0–10.5)
nRBC: 0 % (ref 0.0–0.2)

## 2019-12-04 LAB — COMPREHENSIVE METABOLIC PANEL
ALT: 25 U/L (ref 0–44)
AST: 44 U/L — ABNORMAL HIGH (ref 15–41)
Albumin: 3.3 g/dL — ABNORMAL LOW (ref 3.5–5.0)
Alkaline Phosphatase: 143 U/L — ABNORMAL HIGH (ref 38–126)
Anion gap: 14 (ref 5–15)
BUN: 21 mg/dL — ABNORMAL HIGH (ref 6–20)
CO2: 28 mmol/L (ref 22–32)
Calcium: 9.1 mg/dL (ref 8.9–10.3)
Chloride: 89 mmol/L — ABNORMAL LOW (ref 98–111)
Creatinine, Ser: 5.12 mg/dL — ABNORMAL HIGH (ref 0.61–1.24)
GFR calc Af Amer: 13 mL/min — ABNORMAL LOW (ref >60)
GFR calc non Af Amer: 11 mL/min — ABNORMAL LOW (ref >60)
Glucose, Bld: 110 mg/dL — ABNORMAL HIGH (ref 70–99)
Potassium: 4.4 mmol/L (ref 3.5–5.1)
Sodium: 131 mmol/L — ABNORMAL LOW (ref 135–145)
Total Bilirubin: 1.3 mg/dL — ABNORMAL HIGH (ref 0.3–1.2)
Total Protein: 6.4 g/dL — ABNORMAL LOW (ref 6.5–8.1)

## 2019-12-04 LAB — TROPONIN I (HIGH SENSITIVITY)
Troponin I (High Sensitivity): 25 ng/L — ABNORMAL HIGH (ref <18)
Troponin I (High Sensitivity): 26 ng/L — ABNORMAL HIGH (ref <18)

## 2019-12-04 LAB — CBG MONITORING, ED: Glucose-Capillary: 109 mg/dL — ABNORMAL HIGH (ref 70–99)

## 2019-12-04 MED ORDER — NITROGLYCERIN 0.4 MG SL SUBL
0.4000 mg | SUBLINGUAL_TABLET | SUBLINGUAL | Status: DC | PRN
  Administered 2019-12-04: 0.4 mg via SUBLINGUAL
  Filled 2019-12-04: qty 1

## 2019-12-04 MED ORDER — SODIUM CHLORIDE 0.9% FLUSH
3.0000 mL | Freq: Once | INTRAVENOUS | Status: AC
  Administered 2019-12-04: 3 mL via INTRAVENOUS

## 2019-12-04 NOTE — ED Provider Notes (Signed)
[facility] EMERGENCY DEPARTMENT Provider Note   CSN: [id number] Arrival date & time: 12/04/19  7616     History No chief complaint on file.   Matthew ROSENCRANS Sr. is a 59 y.o. male.  HPI Matthew CZAPLICKI Sr. is a 59 y.o. 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  Patient describes it pressure as central, nonradiating, with no shortness of breath, diaphoresis or nausea.  He states he is uncertain until symptoms began he states no pleuritic component of pain.  Patient states that he received an IV iron infusion today and is felt immediate chest tightness symptoms the IV iron infusion began.  He states that infusion was stopped and he received Benadryl from EMS as well as 1 nitro which dramatically improved his pain.  He also received aspirin by EMS.  Patient initially with elevated blood pressure he denies any headache, lightheadedness, weakness, abdominal pain. Denies any shortness of breath, wheezing, rash or abdominal pain no nausea or diarrhea.  Patient has had infusions were without any reactions.    Past Medical History:  Diagnosis Date  . Acute on chronic combined systolic and diastolic CHF (congestive heart failure) ([place]) 10/15/2017  . Chronic anemia   . Chronic combined systolic and diastolic CHF (congestive heart failure) ([place])   . Chronic stable angina ([place])   . CKD (chronic kidney disease), stage IV ([place])   . Coronary artery disease    a. INF MI 1999: PCI of RCA;  b.  extensive stenting of LAD;  c. s/p CABG with RIMA->RCA in 2006;  d. Stable, Low risk MV 05/2012; e. 09/2012 Cath: stable anatomy->med Rx. f. Abnl nuc 11/2013 -> med rx; g. 11/2013 Cath/PCI: s/p DES to RCA. h. Abnormal nuc 11/2016, pt  declined cath.   . DM2 (diabetes mellitus, type 2) (HCC)    Type II  . Dyslipidemia   . GERD (gastroesophageal reflux disease)   . History of gout   . Hypertension   . Hypertensive heart disease   . Ischemic cardiomyopathy   . Mild aortic stenosis   . Myocardial infarction [facility]) 1999; 2002; 2003; 2006; ~ 2008  . OSA on CPAP    wears CPAP  . Polycystic kidney disease     Patient Active Problem List   Diagnosis Date Noted  . NSTEMI (non-ST elevated myocardial infarction) ([place]) 07/10/2019  . ESRD (end stage renal disease) ([place]) 07/10/2019  . Coronary artery disease involving native heart with unstable angina pectoris ([place])   . Acute respiratory failure ([place])   . Acute diastolic (congestive) heart failure ([place])   . Unstable angina ([place]) 06/18/2018  . Leukocytosis 06/18/2018  . Chronic pain 06/18/2018  . Insomnia 06/18/2018  . Chest pain 06/17/2018  . Chronic heart failure with preserved ejection fraction ([place]) 06/06/2018  . Anemia due to stage 4 chronic kidney disease ([place]) 02/12/2018  . Coronary artery disease of native heart with stable angina pectoris ([place]) 10/25/2017  . Chronic diastolic heart failure ([place]) 10/25/2017  . Chronic anemia 10/15/2017  . Elevated troponin 10/15/2017  . CKD (chronic kidney disease) stage 4, GFR 15-29 ml/min (HCC) 07/22/2017  . Hyperkalemia 04/01/2017  . Non-ST elevation (NSTEMI) myocardial infarction ([place]) 04/01/2017  . Chest pain with moderate risk of acute coronary syndrome  04/01/2017  . Hx of CABG   . Ischemic cardiomyopathy 02/21/2017  . HLD (hyperlipidemia)   . GERD (gastroesophageal reflux disease) 05/08/2015  . Gout 05/08/2015  . MI (myocardial infarction) ([place]) 04/19/2015  . CAD- S/P multiple PCIs, CABG   . Hypertensive heart disease   . Precordial chest pain 11/27/2013  . Financial difficulty 03/13/2013  . Medically noncompliant 03/13/2013  . Type 2 diabetes with nephropathy ([place])   . Obesity-BMI 42 11/27/2012  . Polycystic  kidney disease 09/15/2012  . OSA on CPAP 09/15/2012  . Hypertension 03/27/2011  . Hyperlipidemia associated with type 2 diabetes mellitus [facility])     Past Surgical History:  Procedure Laterality Date  . AV FISTULA PLACEMENT Left 05/29/2018   Procedure: ARTERIOVENOUS (AV) FISTULA CREATION  LEFT ARM.;  Surgeon: Serafina Mitchell, MD;  Location: [place];  Service: Vascular;  Laterality: Left;  . BASCILIC VEIN TRANSPOSITION Left 07/24/2018   Procedure: SECOND STAGE BASCILIC VEIN TRANSPOSITION LEFT ARM;  Surgeon: Serafina Mitchell, MD;  Location: [place];  Service: Vascular;  Laterality: Left;  . CARDIAC CATHETERIZATION  2012  . CARDIAC CATHETERIZATION  2013  . CARDIAC CATHETERIZATION  2014   LHC (1/14): total occlusion of distal LAD, diffuse disease in ramus, 70% proximal LCx, 50% proximal and mid RCA, 50% distal RCA, patent RIMA-RCA.  Marland Kitchen CORONARY ANGIOPLASTY WITH STENT PLACEMENT  1999 / 2002 / 2004 / 2006?  . CORONARY ARTERY BYPASS GRAFT  2006   CABG X?; in [place]  . CYSTECTOMY  1990's   off face  . HERNIA REPAIR     inguinal hernia  . LEFT HEART CATH AND CORONARY ANGIOGRAPHY N/A 07/13/2019   Procedure: LEFT HEART CATH AND CORONARY ANGIOGRAPHY;  Surgeon: Nelva Bush, MD;  Location: [place] CV LAB;  Service: Cardiovascular;  Laterality: N/A;  . LEFT HEART CATHETERIZATION WITH CORONARY/GRAFT ANGIOGRAM N/A 09/16/2012   Procedure: LEFT HEART CATHETERIZATION WITH [name];  Surgeon: [name], MD;  Location: [facility] CATH LAB;  Service: Cardiovascular;  Laterality: N/A;  . LEFT HEART CATHETERIZATION WITH CORONARY/GRAFT ANGIOGRAM N/A 12/02/2013   Procedure: LEFT HEART CATHETERIZATION WITH [name];  Surgeon: [name], MD;  Location: [place] CATH LAB;  Service: Cardiovascular;  Laterality: N/A;  . PERCUTANEOUS CORONARY STENT INTERVENTION (PCI-S) N/A 12/03/2013   Procedure: PERCUTANEOUS CORONARY STENT INTERVENTION (PCI-S);  Surgeon: [name], MD;  Location:  [facility] CATH LAB;  Service: Cardiovascular;  Laterality: N/A;  . RIGHT/LEFT HEART CATH AND CORONARY/GRAFT ANGIOGRAPHY N/A 06/23/2018   Procedure: RIGHT/LEFT HEART CATH AND CORONARY/GRAFT ANGIOGRAPHY;  Surgeon: [name], MD;  Location: [place] CV LAB;  Service: Cardiovascular;  Laterality: N/A;       Family History  Problem Relation Age of Onset  . Lung cancer Mother   . Anemia Mother   . Polycystic kidney disease Mother   . Diabetes Father   . Heart attack Father 74       Died suddenly  . Heart failure Father   . Hyperlipidemia Father   . Hypertension Father   . Kidney failure Sister 5       Died  . Heart attack Sister   . Hypertension Sister   . Diabetes Sister   . Diabetes Brother   . Polycystic kidney disease Brother   . Diabetes Sister     Social History   Tobacco Use  . Smoking status: Former Smoker    Types: Cigarettes  . Smokeless tobacco: Never Used  . Tobacco comment: smoked some as  a teenager (high school) not even a pack a week  Substance Use Topics  . Alcohol use: No    Alcohol/week: 0.0 standard drinks    Comment: 09/15/2012 "drank a little when I was young"  . Drug use: No    Home Medications Prior to Admission medications   Medication Sig Start Date End Date Taking? Authorizing Provider  acetaminophen (TYLENOL) 500 MG tablet Take 500 mg by mouth 2 (two) times daily as needed (for pain or headaches).    Yes [provider]  allopurinol (ZYLOPRIM) 100 MG tablet Take 50 mg by mouth 2 (two) times daily.    Yes [provider]  atorvastatin (LIPITOR) 80 MG tablet Take 1 tablet (80 mg total) by mouth daily. Patient taking differently: Take 40 mg by mouth at bedtime.  08/26/17  Yes Dunn, Nedra Hai, PA-C  Calcium Acetate 667 MG TABS Take 667 mg by mouth See admin instructions. Takes 1 tablet in the morning, and 2 tablets in the evening. 10/13/19  Yes [provider]  carvedilol (COREG) 25 MG tablet Take 1 tablet (25 mg total) by  mouth 2 (two) times daily with a meal. Skip AM dose on dialysis days Patient taking differently: Take 25 mg by mouth daily. Takes in the evening on non dialysis days. Dialysis days are M, W, F. 12/01/19  Yes [name], MD  Cholecalciferol 2000 units TABS Take 2,000 Units by mouth daily.    Yes [provider]  clopidogrel (PLAVIX) 75 MG tablet TAKE ONE TABLET BY MOUTH ONCE DAILY WITH BREAKFAST Patient taking differently: Take 75 mg by mouth daily.  12/25/16  Yes End, Harrell Gave, MD  diphenhydrAMINE (BENADRYL) 25 MG tablet Take 25 mg by mouth See admin instructions. Takes on dialysis days for itching (M,W,F).   Yes [provider]  gabapentin (NEURONTIN) 300 MG capsule Take 1 capsule (300 mg total) by mouth daily. 07/16/19 12/04/19 Yes Ghimire, Dante Gang, MD  glucose blood (ACCU-CHEK AVIVA PLUS) test strip 1 each by Other route in the morning, at noon, and at bedtime. 09/29/19  Yes [provider]  insulin aspart protamine- aspart (NOVOLOG MIX 70/30) (70-30) 100 UNIT/ML injection Inject 20 Units into the skin as needed (if blood sugar is over 180).    Yes [provider]  isosorbide mononitrate (IMDUR) 60 MG 24 hr tablet Take 1 tablet (60 mg total) by mouth 2 (two) times daily. Patient taking differently: Take 60 mg by mouth in the morning and at bedtime.  07/16/19 12/04/19 Yes Ghimire, Dante Gang, MD  lidocaine-prilocaine (EMLA) cream Apply 1 application topically 3 (three) times a week. 12/01/19  Yes [provider]  [place]. Devices MISC by Does not apply route. C-PAP   Yes [provider]  Multiple Vitamin (MULTIVITAMIN WITH MINERALS) TABS tablet Take 1 tablet by mouth daily.   Yes [provider]  nitroGLYCERIN (NITROSTAT) 0.4 MG SL tablet Place 1 tablet (0.4 mg total) under the tongue every 5 (five) minutes as needed for chest pain. 06/06/18  Yes End, Harrell Gave, MD  pantoprazole (PROTONIX) 40 MG tablet Take 40 mg by mouth daily.    Yes  [provider]  ranolazine (RANEXA) 500 MG 12 hr tablet Take 1 tablet (500 mg total) by mouth 2 (two) times daily. 03/29/17  Yes End, Harrell Gave, MD  Semaglutide (OZEMPIC, 0.25 OR 0.5 MG/DOSE, [place]) Inject 0.25 mg into the skin every Saturday.   Yes [provider]  traMADol (ULTRAM) 50 MG tablet Take 1 tablet (50 mg  total) by mouth every 12 (twelve) hours as needed for moderate pain. 06/26/18  Yes Patrecia Pour, MD  vitamin B-12 (CYANOCOBALAMIN) 1000 MCG tablet Take 1,000 mcg by mouth See admin instructions. On M,W,F - Dialysis days.   Yes [provider]  zolpidem (AMBIEN) 10 MG tablet Take 10 mg by mouth at bedtime as needed for sleep.  09/30/17   [provider]    Allergies    Cleocin [clindamycin hcl], Glimepiride [amaryl], Clarithromycin, Colchicine, and Ciprocin-fluocin-procin [fluocinolone acetonide]  Review of Systems   Review of Systems  Constitutional: Negative for chills and fever.  HENT: Negative for congestion.   Eyes: Negative for pain.  Respiratory: Negative for cough and shortness of breath.   Cardiovascular: Positive for chest pain. Negative for leg swelling.  Gastrointestinal: Negative for abdominal pain and vomiting.  Genitourinary: Negative for dysuria.  Musculoskeletal: Negative for myalgias.  Skin: Negative for rash.  Neurological: Negative for dizziness and headaches.    Physical Exam Updated Vital Signs BP 126/71 (BP Location: Right Arm)   Pulse 61   Temp 98.2 F (36.8 C) (Oral)   Resp (!) 22   Ht 5\' 5"  (1.651 m)   Wt (!) 170 kg   SpO2 100%   BMI 62.37 kg/m   Physical Exam Vitals and nursing note reviewed.  Constitutional:      General: He is not in acute distress.    Appearance: He is obese.     Comments: Obese 59 year old male, pleasant, able answer questions appropriately follow commands.  HENT:     Head: Normocephalic and atraumatic.     Nose: Nose normal.     Mouth/Throat:     Mouth: Mucous membranes are  moist.  Eyes:     General: No scleral icterus. Cardiovascular:     Rate and Rhythm: Normal rate and regular rhythm.     Pulses: Normal pulses.     Heart sounds: Normal heart sounds.     Comments: Fistula in left upper arm.  Good thrill. Pulmonary:     Effort: Pulmonary effort is normal. No respiratory distress.     Breath sounds: No wheezing.  Abdominal:     Palpations: Abdomen is soft.     Tenderness: There is no abdominal tenderness. There is no guarding or rebound.  Musculoskeletal:     Cervical back: Normal range of motion and neck supple.     Right lower leg: No edema.     Left lower leg: No edema.  Skin:    General: Skin is warm and dry.     Capillary Refill: Capillary refill takes less than 2 seconds.  Neurological:     Mental Status: He is alert. Mental status is at baseline.  Psychiatric:        Mood and Affect: Mood normal.        Behavior: Behavior normal.     ED Results / Procedures / Treatments   Labs (all labs ordered are listed, but only abnormal results are displayed) Labs Reviewed  CBC - Abnormal; Notable for the following components:      Result Value   RBC 4.15 (*)    Hemoglobin 11.1 (*)    HCT 34.9 (*)    RDW 20.2 (*)    Platelets 113 (*)    All other components within normal limits  COMPREHENSIVE METABOLIC PANEL - Abnormal; Notable for the following components:   Sodium 131 (*)    Chloride 89 (*)    Glucose, Bld 110 (*)  BUN 21 (*)    Creatinine, Ser 5.12 (*)    Total Protein 6.4 (*)    Albumin 3.3 (*)    AST 44 (*)    Alkaline Phosphatase 143 (*)    Total Bilirubin 1.3 (*)    GFR calc non Af Amer 11 (*)    GFR calc Af Amer 13 (*)    All other components within normal limits  CBG MONITORING, ED - Abnormal; Notable for the following components:   Glucose-Capillary 109 (*)    All other components within normal limits  TROPONIN I (HIGH SENSITIVITY) - Abnormal; Notable for the following components:   Troponin I (High Sensitivity) 25 (*)      All other components within normal limits  TROPONIN I (HIGH SENSITIVITY) - Abnormal; Notable for the following components:   Troponin I (High Sensitivity) 26 (*)    All other components within normal limits    EKG EKG Interpretation  Date/Time:  Friday December 04 2019 14:47:27 EDT Ventricular Rate:  62 PR Interval:    QRS Duration: 126 QT Interval:  454 QTC Calculation: 462 R Axis:   -62 Text Interpretation: Sinus rhythm Atrial premature complex Probable left atrial enlargement Nonspecific IVCD with LAD Inferior infarct, old Extensive anterior infarct, old Lateral leads are also involved Confirmed by Lennice Sites [phone]) on 12/05/2019 9:55:44 AM   Radiology DG Chest 2 View  Result Date: 12/04/2019 CLINICAL DATA:  Chest pain EXAM: CHEST - 2 VIEW COMPARISON:  07/10/2019 FINDINGS: Cardiomegaly. Low volume chest with haziness behind the heart that is likely technical based on the lateral view. There could also be transient atelectasis. No visible effusion, edema, or pneumothorax. IMPRESSION: Cardiomegaly and low lung volumes. Electronically Signed   By: Monte Fantasia M.D.   On: 12/04/2019 11:25    Procedures Procedures (including critical care time)  Medications Ordered in ED Medications  sodium chloride flush (NS) 0.9 % injection 3 mL (3 mLs Intravenous Given 12/04/19 1043)    ED Course  I have reviewed the triage vital signs and the nursing notes.  Pertinent labs & imaging results that were available during my care of the patient were reviewed by me and considered in my medical decision making (see chart for details).  The emergent causes of chest pain include: Acute coronary syndrome, tamponade, pericarditis/myocarditis, aortic dissection, pulmonary embolism, tension pneumothorax, pneumonia, and esophageal rupture.  I do not believe the patient has an emergent cause of chest pain, other urgent/non-acute considerations include, but are not limited to: chronic angina, aortic  stenosis, cardiomyopathy, mitral valve prolapse, pulmonary hypertension, aortic insufficiency, right ventricular hypertrophy, pleuritis, bronchitis, pneumothorax, tumor, gastroesophageal reflux disease (GERD), esophageal spasm, Mallory-Weiss syndrome, peptic ulcer disease, pancreatitis, functional gastrointestinal pain, cervical or thoracic disk disease or arthritis, shoulder arthritis, costochondritis, subacromial bursitis, anxiety or panic attack, herpes zoster, breast disorders, chest wall tumors, thoracic outlet syndrome, mediastinitis.  I discussed the differential managing physician and with the patient.  Okay  Clinical Course as of Dec 05 2138  Fri Dec 04, 2019  1507 Troponin is flat.  CBC without leukocytosis.  Mild chronic anemia.  Unchanged her baseline.   [WF]    Clinical Course User Index [WF] Tedd Sias, [place]    Offered patient the patient for chest pain rule out however patient would prefer to be discharged home at this time.  I believe this is reasonable given that he has close follow-up.  I was in room with patient when his wife made appointment for him with  his cardiologist for the next week.  As he is close follow-up pain believe he is reasonable for discharge this time.  I discussed this case with my attending physician who cosigned this note including patient's presenting symptoms, physical exam, and planned diagnostics and interventions. Attending physician stated agreement with plan or made changes to plan which were implemented.   Attending physician assessed patient at bedside.  The medical records were personally reviewed by myself. I personally reviewed all lab results and interpreted all imaging studies and either concurred with their official read or contacted radiology for clarification.   This patient appears reasonably screened and I doubt any other medical condition requiring further workup, evaluation, or treatment in the ED at this time prior to discharge.     Patient's vitals are WNL apart from vital sign abnormalities discussed above, patient is in NAD, and able to ambulate in the ED at their baseline and able to tolerate PO.  Pain has been managed or a plan has been made for home management and has no complaints prior to discharge. Patient is comfortable with above plan and for discharge at this time. All questions were answered prior to disposition. Results from the ER workup discussed with the patient face to face and all questions answered to the best of my ability. The patient is safe for discharge with strict return precautions. Patient appears safe for discharge with appropriate follow-up. Conveyed my impression with the patient and they voiced understanding and are agreeable to plan.   An After Visit Summary was printed and given to the patient.  Portions of this note were generated with [employer]. Dictation errors may occur despite best attempts at proofreading.    MDM Rules/Calculators/A&P                       Final Clinical Impression(s) / ED Diagnoses Final diagnoses:  Chest pain, unspecified type    Rx / DC Orders ED Discharge Orders    None       Tedd Sias, [place] 12/05/19 2140    Tegeler, Gwenyth Allegra, MD 12/06/19 [id number]

## 2019-12-04 NOTE — ED Notes (Signed)
Pt. Transported to Xray 

## 2019-12-04 NOTE — ED Triage Notes (Signed)
Pt here from dialysis center with c/o chest pressure after getting an iron infusion , pt received 25mg  of benadryl by dialysis and 1 nitro and 324mg  asa by ems

## 2019-12-04 NOTE — Discharge Instructions (Signed)
Please follow-up with your cardiologist.  Please take your medications as prescribed.  Please return to ED if you have any new or concerning symptoms.

## 2019-12-22 ENCOUNTER — Telehealth: Payer: Self-pay | Admitting: Interventional Cardiology

## 2019-12-22 NOTE — Telephone Encounter (Signed)
Wife of the patient would like to be present during the patient's appointment on Friday. Please let the wife know what the office decides.

## 2019-12-22 NOTE — Telephone Encounter (Signed)
Left message to call back  

## 2019-12-24 NOTE — Progress Notes (Signed)
Cardiology Office Note   Date:  12/25/2019   ID:  Matthew Kirks Sr., DOB [birth year], MRN [id number]  PCP:  Matthew Limbo, MD    No chief complaint on file.  CAD  Wt Readings from Last 3 Encounters:  12/25/19 231 lb 12.8 oz (105.1 kg)  12/04/19 (!) 374 lb 12.5 oz (170 kg)  08/11/19 231 lb 1.9 oz (104.8 kg)       History of Present Illness: Matthew Dunavan. is a 59 y.o. 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  In October 2019, he had an episode of chest discomfort when he went to the [facility]. It was 10 out of 10 while he was having a stress test done. Left heart catheterization was done showing complex LAD disease. Given that his anterior wall was thought to be partially nonviable due to prior infarction, and treating the LAD stenosis would JL a large ramus territory which was viable, medical therapy was opted for. He has done well with this.  Now on dialysis.  He had CP with dialysis in 07/2019.  Cath showed moderate RCA disease, which appeared unchanged.  It was likely the only culprit and was difficult to see due to competitive flow.  Since the last visit, I spoke to Dr. Jimmy Rowe in 3/21 and there was a concern for accelerating angina.  She received IV iron during a dialysis treatment on March 26 and developed pain in his chest.  He was sent to the emergency room and ruled out for MI.  He was not admitted.    Since then, he has felt well.  He denies any chest discomfort.  He is now taking an oral iron supplement and he tolerates this well.  He has not had to use any nitroglycerin.     Past Medical History:  Diagnosis Date  . Acute on chronic combined systolic and  diastolic CHF (congestive heart failure) ([place]) 10/15/2017  . Chronic anemia   . Chronic combined systolic and diastolic CHF (congestive heart failure) ([place])   . Chronic stable angina ([place])   . CKD (chronic kidney disease), stage IV ([place])   . Coronary artery disease    a. INF MI 1999: PCI of RCA;  b.  extensive stenting of LAD;  c. s/p CABG with RIMA->RCA in 2006;  d. Stable, Low risk MV 05/2012; e. 09/2012 Cath: stable anatomy->med Rx. f. Abnl nuc 11/2013 -> med rx; g. 11/2013 Cath/PCI: s/p DES to RCA. h. Abnormal nuc 11/2016, pt declined cath.   . DM2 (diabetes mellitus, type 2) ([facility])    Type II  . Dyslipidemia   . GERD (gastroesophageal reflux disease)   . History of gout   . Hypertension   . Hypertensive heart disease   . Ischemic cardiomyopathy   . Mild aortic stenosis   . Myocardial infarction [facility]) 1999; 2002; 2003; 2006; ~ 2008  . OSA on CPAP    wears CPAP  . Polycystic kidney disease     Past Surgical History:  Procedure Laterality Date  . AV FISTULA PLACEMENT Left 05/29/2018   Procedure: ARTERIOVENOUS (AV) FISTULA CREATION  LEFT ARM.;  Surgeon: Matthew Mitchell, MD;  Location: [facility];  Service: Vascular;  Laterality: Left;  . BASCILIC  VEIN TRANSPOSITION Left 07/24/2018   Procedure: SECOND STAGE BASCILIC VEIN TRANSPOSITION LEFT ARM;  Surgeon: Matthew Mitchell, MD;  Location: [facility];  Service: Vascular;  Laterality: Left;  . CARDIAC CATHETERIZATION  2012  . CARDIAC CATHETERIZATION  2013  . CARDIAC CATHETERIZATION  2014   LHC (1/14): total occlusion of distal LAD, diffuse disease in ramus, 70% proximal LCx, 50% proximal and mid RCA, 50% distal RCA, patent RIMA-RCA.  Marland Kitchen CORONARY ANGIOPLASTY WITH STENT PLACEMENT  1999 / 2002 / 2004 / 2006?  . CORONARY ARTERY BYPASS GRAFT  2006   CABG X?; in [place]  . CYSTECTOMY  1990's   off face  . HERNIA REPAIR     inguinal hernia  . LEFT HEART CATH AND CORONARY ANGIOGRAPHY N/A 07/13/2019   Procedure: LEFT HEART CATH AND CORONARY ANGIOGRAPHY;   Surgeon: Matthew Bush, MD;  Location: [facility];  Service: Cardiovascular;  Laterality: N/A;  . LEFT HEART CATHETERIZATION WITH CORONARY/GRAFT ANGIOGRAM N/A 09/16/2012   Procedure: LEFT HEART CATHETERIZATION WITH Matthew Rowe;  Surgeon: Matthew Mocha, MD;  Location: [facility] CATH [facility];  Service: Cardiovascular;  Laterality: N/A;  . LEFT HEART CATHETERIZATION WITH CORONARY/GRAFT ANGIOGRAM N/A 12/02/2013   Procedure: LEFT HEART CATHETERIZATION WITH Matthew Rowe;  Surgeon: Matthew Hampshire, MD;  Location: [facility] CATH LAB;  Service: Cardiovascular;  Laterality: N/A;  . PERCUTANEOUS CORONARY STENT INTERVENTION (PCI-S) N/A 12/03/2013   Procedure: PERCUTANEOUS CORONARY STENT INTERVENTION (PCI-S);  Surgeon: Matthew Booze, MD;  Location: [facility] CATH LAB;  Service: Cardiovascular;  Laterality: N/A;  . RIGHT/LEFT HEART CATH AND CORONARY/GRAFT ANGIOGRAPHY N/A 06/23/2018   Procedure: RIGHT/LEFT HEART CATH AND CORONARY/GRAFT ANGIOGRAPHY;  Surgeon: Matthew Booze, MD;  Location: [place] [facility];  Service: Cardiovascular;  Laterality: N/A;     Current Outpatient Medications  Medication Sig Dispense Refill  . acetaminophen (TYLENOL) 500 MG tablet Take 500 mg by mouth 2 (two) times daily as needed (for pain or headaches).     Marland Kitchen allopurinol (ZYLOPRIM) 100 MG tablet Take 50 mg by mouth 2 (two) times daily.     Marland Kitchen atorvastatin (LIPITOR) 80 MG tablet Take 1 tablet (80 mg total) by mouth daily. 90 tablet 1  . Calcium Acetate 667 MG TABS Take 667 mg by mouth See admin instructions. Takes 1 tablet in the morning, and 2 tablets in the evening.    . carvedilol (COREG) 25 MG tablet Take 1 tablet (25 mg total) by mouth 2 (two) times daily with a meal. Skip AM dose on dialysis days 180 tablet 3  . Cholecalciferol 2000 units TABS Take 2,000 Units by mouth daily.     . clopidogrel (PLAVIX) 75 MG tablet TAKE ONE TABLET BY MOUTH ONCE DAILY WITH BREAKFAST 90 tablet 1  . diphenhydrAMINE (BENADRYL)  25 MG tablet Take 25 mg by mouth See admin instructions. Takes on dialysis days for itching (M,W,F).    . furosemide (LASIX) 80 MG tablet     . gabapentin (NEURONTIN) 300 MG capsule Take 1 capsule (300 mg total) by mouth daily. 30 capsule 0  . glucose blood (ACCU-CHEK AVIVA PLUS) test strip 1 each by Other route in the morning, at noon, and at bedtime.    . insulin aspart protamine- aspart (NOVOLOG MIX 70/30) (70-30) 100 UNIT/ML injection Inject 20 Units into the skin as needed (if blood sugar is over 180).     . isosorbide mononitrate (IMDUR) 60 MG 24 hr tablet Take 1 tablet (60 mg total) by mouth 2 (two) times daily.  60 tablet 0  . lidocaine-prilocaine (EMLA) cream Apply 1 application topically 3 (three) times a week.    . Misc. Devices MISC by Does not apply route. C-PAP    . Multiple Vitamin (MULTIVITAMIN WITH MINERALS) TABS tablet Take 1 tablet by mouth daily.    . nitroGLYCERIN (NITROSTAT) 0.4 MG SL tablet Place 1 tablet (0.4 mg total) under the tongue every 5 (five) minutes as needed for chest pain. 25 tablet 5  . pantoprazole (PROTONIX) 40 MG tablet Take 40 mg by mouth daily.     . ranolazine (RANEXA) 500 MG 12 hr tablet Take 1 tablet (500 mg total) by mouth 2 (two) times daily. 180 tablet 3  . Semaglutide (OZEMPIC, 0.25 OR 0.5 MG/DOSE, [place]) Inject 0.25 mg into the skin every Saturday.    . traMADol (ULTRAM) 50 MG tablet Take 1 tablet (50 mg total) by mouth every 12 (twelve) hours as needed for moderate pain. 12 tablet 0  . vitamin B-12 (CYANOCOBALAMIN) 1000 MCG tablet Take 1,000 mcg by mouth See admin instructions. On M,W,F - Dialysis days.    Marland Kitchen zolpidem (AMBIEN) 10 MG tablet Take 10 mg by mouth at bedtime as needed for sleep.      No current facility-administered medications for this visit.    Allergies:   Cleocin [clindamycin hcl], Glimepiride [amaryl], Clarithromycin, Colchicine, and Ciprocin-fluocin-procin [fluocinolone acetonide]    Social History:  The patient  reports that he  has quit smoking. His smoking use included cigarettes. He has never used smokeless tobacco. He reports that he does not drink alcohol or use drugs.   Family History:  The patient's family history includes Anemia in his mother; Diabetes in his brother, father, sister, and sister; Heart attack in his sister; Heart attack (age of onset: 59) in his father; Heart failure in his father; Hyperlipidemia in his father; Hypertension in his father and sister; Kidney failure (age of onset: 52) in his sister; Lung cancer in his mother; Polycystic kidney disease in his brother and mother.    ROS:  Please see the history of present illness.   Otherwise, review of systems are positive for increased abdominal girth.   All other systems are reviewed and negative.    PHYSICAL EXAM: VS:  BP 108/60   Pulse 67   Ht 5\' 5"  (1.651 m)   Wt 231 lb 12.8 oz (105.1 kg)   SpO2 99%   BMI 38.57 kg/m  , BMI Body mass index is 38.57 kg/m. GEN: Well nourished, well developed, in no acute distress  HEENT: normal  Neck: no JVD, carotid bruits, or masses Cardiac: RRR; no murmurs, rubs, or gallops,no edema  Respiratory:  clear to auscultation bilaterally, normal work of breathing GI: soft, nontender, nondistended, + BS, obese MS: no deformity or atrophy  Skin: warm and dry, no rash Neuro:  Strength and sensation are intact Psych: euthymic mood, full affect     Recent Labs: 07/10/2019: B Natriuretic Peptide 1,468.8 12/04/2019: ALT 25; BUN 21; Creatinine, Ser 5.12; Hemoglobin 11.1; Platelets 113; Potassium 4.4; Sodium 131   Lipid Panel    Component Value Date/Time   CHOL 128 06/25/2018 0326   CHOL 161 11/12/2016 1341   TRIG 101 06/25/2018 0326   HDL 35 (L) 06/25/2018 0326   HDL 31 (L) 11/12/2016 1341   CHOLHDL 3.7 06/25/2018 0326   VLDL 20 06/25/2018 0326   LDLCALC 73 06/25/2018 0326   LDLCALC 86 11/12/2016 1341   LDLDIRECT 72.0 11/03/2014 0923     Other studies  Reviewed: Additional studies/ records that were  reviewed today with results demonstrating: Labs from ER reviewed.  .   ASSESSMENT AND PLAN:  1. CAD: I personally reviewed his cath films.  Has moderate RCA disease.  Difficult to visualize due to competetive flow.  If we plan on cath, may need dual access to properly visualize RCA, or may need to access the native RCA and placed the balloon in the distal RIMA to occlude flow to properly visualize the RCA.  We discussed catheterization at this time but given his lack of symptoms, he is not interested.  I think this is fine and if his symptoms get worse, we will reassess.  Is in the hospital and March were flat and did not indicate acute coronary syndrome. 2. ESRD: Dialysis 3x/week.  Tolerating dialysis well per his report.  The only issue he had was when he received IV iron. 3. Hyperlipidemia: LDL 73 in 2019.  He does need his lipids rechecked.  This can be done at dialysis. 4. DM: We discussed limiting processed foods and sugars in his diet. 5. We also discussed iron rich foods which are heart healthy.  He will try to consume more spinach and leafy green vegetables.   Current medicines are reviewed at length with the patient today.  The patient concerns regarding his medicines were addressed.  The following changes have been made:  No change  Labs/ tests ordered today include:  No orders of the defined types were placed in this encounter.   Recommend 150 minutes/week of aerobic exercise Low fat, low carb, high fiber diet recommended  Disposition:   FU in 6 months   Signed, Larae Grooms, MD  12/25/2019 9:12 AM    [facility] [address], [place],   [address] Phone: [phone]; Fax: [phone]

## 2019-12-25 ENCOUNTER — Encounter: Payer: Self-pay | Admitting: Interventional Cardiology

## 2019-12-25 ENCOUNTER — Other Ambulatory Visit: Payer: Self-pay

## 2019-12-25 ENCOUNTER — Ambulatory Visit (INDEPENDENT_AMBULATORY_CARE_PROVIDER_SITE_OTHER): Payer: Medicare HMO | Admitting: Interventional Cardiology

## 2019-12-25 VITALS — BP 108/60 | HR 67 | Ht 65.0 in | Wt 231.8 lb

## 2019-12-25 DIAGNOSIS — N186 End stage renal disease: Secondary | ICD-10-CM | POA: Diagnosis not present

## 2019-12-25 DIAGNOSIS — I25118 Atherosclerotic heart disease of native coronary artery with other forms of angina pectoris: Secondary | ICD-10-CM | POA: Diagnosis not present

## 2019-12-25 DIAGNOSIS — I255 Ischemic cardiomyopathy: Secondary | ICD-10-CM

## 2019-12-25 DIAGNOSIS — E785 Hyperlipidemia, unspecified: Secondary | ICD-10-CM

## 2019-12-25 DIAGNOSIS — E1159 Type 2 diabetes mellitus with other circulatory complications: Secondary | ICD-10-CM

## 2019-12-25 DIAGNOSIS — I10 Essential (primary) hypertension: Secondary | ICD-10-CM

## 2019-12-25 MED ORDER — PANTOPRAZOLE SODIUM 40 MG PO TBEC: 40.0000 mg | DELAYED_RELEASE_TABLET | Freq: Every day | ORAL | 3 refills | Status: AC

## 2019-12-25 NOTE — Patient Instructions (Signed)
Medication Instructions:  Your physician recommends that you continue on your current medications as directed. Please refer to the Current Medication list given to you today.  *If you need a refill on your cardiac medications before your next appointment, please call your pharmacy*   Lab Work: None ordered  If you have labs (blood work) drawn today and your tests are completely normal, you will receive your results only by: Marland Kitchen MyChart Message (if you have MyChart) OR . A paper copy in the mail If you have any lab test that is abnormal or we need to change your treatment, we will call you to review the results.   Testing/Procedures: None ordered   Follow-Up: At [facility], you and your health needs are our priority.  As part of our continuing mission to provide you with exceptional heart care, we have created designated Provider Care Teams.  These Care Teams include your primary Cardiologist (physician) and Advanced Practice Providers (APPs -  Physician Assistants and Nurse Practitioners) who all work together to provide you with the care you need, when you need it.  We recommend signing up for the patient portal called "MyChart".  Sign up information is provided on this After Visit Summary.  MyChart is used to connect with patients for Virtual Visits (Telemedicine).  Patients are able to view lab/test results, encounter notes, upcoming appointments, etc.  Non-urgent messages can be sent to your provider as well.   To learn more about what you can do with MyChart, go to [email or web address].    Your next appointment:   6 month(s)  The format for your next appointment:   In Person  Provider:   You may see Larae Grooms, MD or one of the following Advanced Practice Providers on your designated Care Team:    Melina Copa, PA-C  Ermalinda Barrios, PA-C    Other Instructions    Iron Deficiency Anemia, Adult Iron-deficiency anemia is when you have a low amount of red blood  cells or hemoglobin. This happens because you have too little iron in your body. Hemoglobin carries oxygen to parts of the body. Anemia can cause your body to not get enough oxygen. It may or may not cause symptoms. Follow these instructions at home: Medicines  Take over-the-counter and prescription medicines only as told by your doctor. This includes iron pills (supplements) and vitamins.  If you cannot handle taking iron pills by mouth, ask your doctor about getting iron through: ? A vein (intravenously). ? A shot (injection) into a muscle.  Take iron pills when your stomach is empty. If you cannot handle this, take them with food.  Do not drink milk or take antacids at the same time as your iron pills.  To prevent trouble pooping (constipation), eat fiber or take medicine (stool softener) as told by your doctor. Eating and drinking   Talk with your doctor before changing the foods you eat. He or she may tell you to eat foods that have a lot of iron, such as: ? Breads and cereals that have iron added to them (fortified breads and cereals). ? Eggs. ? Dried fruit. ? Dark green, leafy vegetables.  Drink enough fluid to keep your pee (urine) clear or pale yellow.  Eat fresh fruits and vegetables that are high in vitamin C. They help your body to use iron. Foods with a lot of vitamin C include: ? Oranges. ? Peppers. ? Tomatoes. ? Mangoes. General instructions  Return to your normal activities as told by your  doctor. Ask your doctor what activities are safe for you.  Keep yourself clean, and keep things clean around you (your surroundings). Anemia can make you get sick more easily.  Keep all follow-up visits as told by your doctor. This is important. Contact a doctor if:  You feel sick to your stomach (nauseous).  You throw up (vomit).  You feel weak.  You are sweating for no clear reason.  You have trouble pooping, such as: ? Pooping (having a bowel movement) less than 3  times a week. ? Straining to poop. ? Having poop that is hard, dry, or larger than normal. ? Feeling full or bloated. ? Pain in the lower belly. ? Not feeling better after pooping. Get help right away if:  You pass out (faint). If this happens, do not drive yourself to the hospital. Call your local emergency services (911 in the [place]  You have chest pain.  You have shortness of breath that: ? Is very bad. ? Gets worse with physical activity.  You have a fast heartbeat.  You get light-headed when getting up from sitting or lying down. This information is not intended to replace advice given to you by your health care provider. Make sure you discuss any questions you have with your health care provider. Document Revised: 08/09/2017 Document Reviewed: 05/16/2016 Elsevier Patient Education  [place].

## 2019-12-25 NOTE — Telephone Encounter (Signed)
Patient was seen today with his wife present.

## 2020-01-13 ENCOUNTER — Telehealth: Payer: Self-pay

## 2020-01-13 NOTE — Telephone Encounter (Signed)
We received a PA request for the pts Calcium. Unclear who proscribed this medication and why. Will forward to Dr Irish Lack for advisement.

## 2020-01-13 NOTE — Telephone Encounter (Signed)
THis should probably go to his nephrologist.   JV

## 2020-01-13 NOTE — Telephone Encounter (Signed)
**Note De-Identified Oaklyn Jakubek Obfuscation** [place] is advised.

## 2020-02-01 ENCOUNTER — Emergency Department (HOSPITAL_COMMUNITY)
Admission: EM | Admit: 2020-02-01 | Discharge: 2020-02-02 | Disposition: A | Discharge status: Home / Self Care | Payer: No Typology Code available for payment source | Attending: Emergency Medicine | Admitting: Emergency Medicine

## 2020-02-01 DIAGNOSIS — Z992 Dependence on renal dialysis: Secondary | ICD-10-CM | POA: Insufficient documentation

## 2020-02-01 DIAGNOSIS — M6281 Muscle weakness (generalized): Secondary | ICD-10-CM | POA: Insufficient documentation

## 2020-02-01 DIAGNOSIS — I5042 Chronic combined systolic (congestive) and diastolic (congestive) heart failure: Secondary | ICD-10-CM | POA: Insufficient documentation

## 2020-02-01 DIAGNOSIS — N186 End stage renal disease: Secondary | ICD-10-CM | POA: Insufficient documentation

## 2020-02-01 DIAGNOSIS — M79604 Pain in right leg: Secondary | ICD-10-CM | POA: Insufficient documentation

## 2020-02-01 DIAGNOSIS — I132 Hypertensive heart and chronic kidney disease with heart failure and with stage 5 chronic kidney disease, or end stage renal disease: Secondary | ICD-10-CM | POA: Insufficient documentation

## 2020-02-01 DIAGNOSIS — M546 Pain in thoracic spine: Secondary | ICD-10-CM | POA: Diagnosis not present

## 2020-02-01 DIAGNOSIS — I252 Old myocardial infarction: Secondary | ICD-10-CM | POA: Diagnosis not present

## 2020-02-01 DIAGNOSIS — I2510 Atherosclerotic heart disease of native coronary artery without angina pectoris: Secondary | ICD-10-CM | POA: Insufficient documentation

## 2020-02-01 DIAGNOSIS — N184 Chronic kidney disease, stage 4 (severe): Secondary | ICD-10-CM | POA: Insufficient documentation

## 2020-02-01 DIAGNOSIS — M79605 Pain in left leg: Secondary | ICD-10-CM | POA: Diagnosis not present

## 2020-02-01 DIAGNOSIS — E1122 Type 2 diabetes mellitus with diabetic chronic kidney disease: Secondary | ICD-10-CM | POA: Diagnosis not present

## 2020-02-01 DIAGNOSIS — Z7901 Long term (current) use of anticoagulants: Secondary | ICD-10-CM | POA: Insufficient documentation

## 2020-02-01 DIAGNOSIS — T426X1A Poisoning by other antiepileptic and sedative-hypnotic drugs, accidental (unintentional), initial encounter: Secondary | ICD-10-CM | POA: Diagnosis not present

## 2020-02-01 DIAGNOSIS — Z79899 Other long term (current) drug therapy: Secondary | ICD-10-CM | POA: Diagnosis not present

## 2020-02-01 DIAGNOSIS — Z87891 Personal history of nicotine dependence: Secondary | ICD-10-CM | POA: Insufficient documentation

## 2020-02-01 DIAGNOSIS — I130 Hypertensive heart and chronic kidney disease with heart failure and stage 1 through stage 4 chronic kidney disease, or unspecified chronic kidney disease: Secondary | ICD-10-CM | POA: Insufficient documentation

## 2020-02-01 LAB — CBC
HCT: 40.3 % (ref 39.0–52.0)
Hemoglobin: 13 g/dL (ref 13.0–17.0)
MCH: 29.2 pg (ref 26.0–34.0)
MCHC: 32.3 g/dL (ref 30.0–36.0)
MCV: 90.6 fL (ref 80.0–100.0)
Platelets: 151 10*3/uL (ref 150–400)
RBC: 4.45 MIL/uL (ref 4.22–5.81)
RDW: 17.8 % — ABNORMAL HIGH (ref 11.5–15.5)
WBC: 6.8 10*3/uL (ref 4.0–10.5)
nRBC: 0 % (ref 0.0–0.2)

## 2020-02-01 LAB — BASIC METABOLIC PANEL
Anion gap: 12 (ref 5–15)
BUN: 20 mg/dL (ref 6–20)
CO2: 30 mmol/L (ref 22–32)
Calcium: 8.7 mg/dL — ABNORMAL LOW (ref 8.9–10.3)
Chloride: 92 mmol/L — ABNORMAL LOW (ref 98–111)
Creatinine, Ser: 4.19 mg/dL — ABNORMAL HIGH (ref 0.61–1.24)
GFR calc Af Amer: 17 mL/min — ABNORMAL LOW (ref >60)
GFR calc non Af Amer: 14 mL/min — ABNORMAL LOW (ref >60)
Glucose, Bld: 130 mg/dL — ABNORMAL HIGH (ref 70–99)
Potassium: 3.9 mmol/L (ref 3.5–5.1)
Sodium: 134 mmol/L — ABNORMAL LOW (ref 135–145)

## 2020-02-01 LAB — CBG MONITORING, ED
Glucose-Capillary: 108 mg/dL — ABNORMAL HIGH (ref 70–99)
Glucose-Capillary: 144 mg/dL — ABNORMAL HIGH (ref 70–99)
Glucose-Capillary: 103 mg/dL — ABNORMAL HIGH (ref 70–99)

## 2020-02-01 MED ORDER — SODIUM CHLORIDE 0.9% FLUSH: 3.0000 mL | Freq: Once | INTRAVENOUS | Status: DC

## 2020-02-01 NOTE — ED Triage Notes (Signed)
Pt reports weakness and pain in bilateral legs since Saturday. Sts he was able to walk into dialysis and sat down to be seen. Then when his name was called he was unable to ambulate d/t pain and weakness. Pt then ambulated around his house later that day and on Sunday. Able to get to dialysis today but extreme pain on completion of dialysis so they told him to come to the ER.

## 2020-02-01 NOTE — ED Notes (Addendum)
Pt family memeber asked if she could get pt soming to drink I told pt we cant give him anything and that we recommend he dont drink until he has been seen. I also informed pt family member that its up to her but we as staff cant.   Pt family memeber went and got pt something to drink at this time

## 2020-02-02 ENCOUNTER — Encounter (HOSPITAL_COMMUNITY): Payer: Self-pay | Admitting: Emergency Medicine

## 2020-02-02 ENCOUNTER — Other Ambulatory Visit: Payer: Self-pay

## 2020-02-02 DIAGNOSIS — Z79899 Other long term (current) drug therapy: Secondary | ICD-10-CM | POA: Insufficient documentation

## 2020-02-02 DIAGNOSIS — M79605 Pain in left leg: Secondary | ICD-10-CM | POA: Insufficient documentation

## 2020-02-02 DIAGNOSIS — M6281 Muscle weakness (generalized): Secondary | ICD-10-CM | POA: Insufficient documentation

## 2020-02-02 DIAGNOSIS — N184 Chronic kidney disease, stage 4 (severe): Secondary | ICD-10-CM | POA: Insufficient documentation

## 2020-02-02 DIAGNOSIS — I130 Hypertensive heart and chronic kidney disease with heart failure and stage 1 through stage 4 chronic kidney disease, or unspecified chronic kidney disease: Secondary | ICD-10-CM | POA: Insufficient documentation

## 2020-02-02 DIAGNOSIS — Z7901 Long term (current) use of anticoagulants: Secondary | ICD-10-CM | POA: Insufficient documentation

## 2020-02-02 DIAGNOSIS — M546 Pain in thoracic spine: Secondary | ICD-10-CM | POA: Insufficient documentation

## 2020-02-02 DIAGNOSIS — I5042 Chronic combined systolic (congestive) and diastolic (congestive) heart failure: Secondary | ICD-10-CM | POA: Insufficient documentation

## 2020-02-02 DIAGNOSIS — I25110 Atherosclerotic heart disease of native coronary artery with unstable angina pectoris: Secondary | ICD-10-CM | POA: Insufficient documentation

## 2020-02-02 DIAGNOSIS — I252 Old myocardial infarction: Secondary | ICD-10-CM | POA: Insufficient documentation

## 2020-02-02 DIAGNOSIS — Z87891 Personal history of nicotine dependence: Secondary | ICD-10-CM | POA: Insufficient documentation

## 2020-02-02 DIAGNOSIS — E1122 Type 2 diabetes mellitus with diabetic chronic kidney disease: Secondary | ICD-10-CM | POA: Insufficient documentation

## 2020-02-02 NOTE — ED Provider Notes (Signed)
Attestation: Medical screening examination/treatment/procedure(s) were conducted as a shared visit with non-physician practitioner(s) and myself.  I personally evaluated the patient during the encounter.   Briefly, the patient is a 59 y.o. male with h/o CHF, CKD on dialysis, Tuesday Thursday Saturday, hypertension, MI who presents for evaluation of bilateral leg pain and weakness. Missed HD on Sat morning. Had full session today. No improved  Vitals:   02/02/20 0030 02/02/20 0321  BP: 111/62 111/85  Pulse: 70 67  Resp: 15 18  Temp: 98.6 F (37 C) 98.6 F (37 C)  SpO2: 100% 99%    CONSTITUTIONAL:  well-appearing, NAD NEURO:  Alert and oriented x 3, no focal deficits EYES:  pupils equal and reactive ENT/NECK:  trachea midline, no JVD CARDIO:  reg rate, reg rhythm, well-perfused PULM:  None labored breathing GI/GU:  Abdomin non-distended MSK/SPINE:  No gross deformities, no edema SKIN:  no rash, atraumatic PSYCH:  Appropriate speech and behavior   EKG Interpretation  Date/Time:  Monday Feb 01 2020 13:26:16 EDT Ventricular Rate:  67 PR Interval:  206 QRS Duration: 120 QT Interval:  462 QTC Calculation: 488 R Axis:   -52 Text Interpretation: Normal sinus rhythm Possible Left atrial enlargement Left axis deviation Inferior infarct , age undetermined Anterolateral infarct , age undetermined Abnormal ECG No significant change since last tracing Confirmed by Addison Lank [phone]) on 02/01/2020 10:58:30 PM       Work up reassuring. No longer symptomatic. Unlikely spinal compression, DVT, acute arterial occlusion. Possibly related to K level, now that it has improved after HD.   The patient appears reasonably screened and/or stabilized for discharge and I doubt any other medical condition or other [facility] requiring further screening, evaluation, or treatment in the ED at this time prior to discharge. Safe for discharge with strict return precautions.      Fatima Blank,  MD 02/02/20 [phone]

## 2020-02-02 NOTE — ED Triage Notes (Signed)
Pt brought back to triage for reassessment, pt says that he came here because he is having bilateral leg pain and weakness for about 3-4 days, intermittent symptoms. Seen at [facility] last night for the same, says that he was having less pain. Symptoms returned during the day, fell 4 times today.  Dialysis (due on Wednesday).

## 2020-02-02 NOTE — Discharge Instructions (Signed)
Follow-up outpatient.  Return for new or worsening symptoms.

## 2020-02-02 NOTE — ED Provider Notes (Signed)
[facility] EMERGENCY DEPARTMENT Provider Note   CSN: [id number] Arrival date & time: 02/01/20  1247    History Leg Pain  [place] is a 59 y.o. male with past history significant for CHF, CKD on dialysis, Tuesday Thursday Saturday, hypertension, MI who presents for evaluation of bilateral leg pain.  Per triage note said he had leg pain since Saturday.  Patient and wife tell me that his pain started today.  He was able to get up this morning take a shower, ambulate.  Pain began as he was walking around his house.  Extreme pain today that he was unable to walk into dialysis and had to be wheeled.  Completed full dialysis session.  When asked to describe his pain patient states "numbness."  However then further goes on to say that he does not have any numbness.  No recent falls or injury.  Denies headache, lightheadedness, dizziness, syncope, neck pain, neck stiffness, chest pain, shortness of breath, abdominal pain, IV drug use, bowel or bladder incontinence, saddle paresthesias, malignancy.  Denies additional aggravating or alleviating factors.   Did state that he recently was taken off of one of his muscle relaxers however is unable to tell me if this is recent.  Unclear timing of symptoms.  According to triage note symptoms started on Saturday.  When I first talk with patient and wife they said symptoms had started after dialysis today then towards the end of the initial evaluation they said the symptoms started prior to dialysis.  History obtained from patient, family and past medical records.  No interpreter is used.  HPI    Past Medical History:  Diagnosis Date  . Acute on chronic combined systolic and diastolic CHF (congestive heart failure) ([place]) 10/15/2017  . Chronic anemia   . Chronic combined systolic and diastolic CHF (congestive heart failure) ([place])   . Chronic stable angina ([place])   . CKD (chronic kidney disease), stage IV ([place])   . Coronary artery  disease    a. INF MI 1999: PCI of RCA;  b.  extensive stenting of LAD;  c. s/p CABG with RIMA->RCA in 2006;  d. Stable, Low risk MV 05/2012; e. 09/2012 Cath: stable anatomy->med Rx. f. Abnl nuc 11/2013 -> med rx; g. 11/2013 Cath/PCI: s/p DES to RCA. h. Abnormal nuc 11/2016, pt declined cath.   . DM2 (diabetes mellitus, type 2) (HCC)    Type II  . Dyslipidemia   . GERD (gastroesophageal reflux disease)   . History of gout   . Hypertension   . Hypertensive heart disease   . Ischemic cardiomyopathy   . Mild aortic stenosis   . Myocardial infarction [facility]) 1999; 2002; 2003; 2006; ~ 2008  . OSA on CPAP    wears CPAP  . Polycystic kidney disease     Patient Active Problem List   Diagnosis Date Noted  . NSTEMI (non-ST elevated myocardial infarction) ([place]) 07/10/2019  . ESRD (end stage renal disease) ([place]) 07/10/2019  . Coronary artery disease involving native heart with unstable angina pectoris ([place])   . Acute respiratory failure ([place])   . Acute diastolic (congestive) heart failure ([place])   . Unstable angina ([place]) 06/18/2018  . Leukocytosis 06/18/2018  . Chronic pain 06/18/2018  . Insomnia 06/18/2018  . Chest pain 06/17/2018  . Chronic heart failure with preserved ejection fraction ([place]) 06/06/2018  . Anemia due to stage 4 chronic kidney disease ([place]) 02/12/2018  . Coronary artery disease of native heart with stable angina  pectoris ([place]) 10/25/2017  . Chronic diastolic heart failure ([place]) 10/25/2017  . Chronic anemia 10/15/2017  . Elevated troponin 10/15/2017  . CKD (chronic kidney disease) stage 4, GFR 15-29 ml/min (HCC) 07/22/2017  . Hyperkalemia 04/01/2017  . Non-ST elevation (NSTEMI) myocardial infarction ([place]) 04/01/2017  . Chest pain with moderate risk of acute coronary syndrome 04/01/2017  . Hx of CABG   . Ischemic cardiomyopathy 02/21/2017  . HLD (hyperlipidemia)   . GERD (gastroesophageal reflux disease) 05/08/2015  . Gout 05/08/2015  . MI (myocardial infarction) ([place])  04/19/2015  . CAD- S/P multiple PCIs, CABG   . Hypertensive heart disease   . Precordial chest pain 11/27/2013  . Financial difficulty 03/13/2013  . Medically noncompliant 03/13/2013  . Type 2 diabetes with nephropathy ([place])   . Obesity-BMI 42 11/27/2012  . Polycystic kidney disease 09/15/2012  . OSA on CPAP 09/15/2012  . Hypertension 03/27/2011  . Hyperlipidemia associated with type 2 diabetes mellitus [facility])     Past Surgical History:  Procedure Laterality Date  . AV FISTULA PLACEMENT Left 05/29/2018   Procedure: ARTERIOVENOUS (AV) FISTULA CREATION  LEFT ARM.;  Surgeon: Serafina Mitchell, MD;  Location: [place];  Service: Vascular;  Laterality: Left;  . BASCILIC VEIN TRANSPOSITION Left 07/24/2018   Procedure: SECOND STAGE BASCILIC VEIN TRANSPOSITION LEFT ARM;  Surgeon: Serafina Mitchell, MD;  Location: [place];  Service: Vascular;  Laterality: Left;  . CARDIAC CATHETERIZATION  2012  . CARDIAC CATHETERIZATION  2013  . CARDIAC CATHETERIZATION  2014   LHC (1/14): total occlusion of distal LAD, diffuse disease in ramus, 70% proximal LCx, 50% proximal and mid RCA, 50% distal RCA, patent RIMA-RCA.  Marland Kitchen CORONARY ANGIOPLASTY WITH STENT PLACEMENT  1999 / 2002 / 2004 / 2006?  . CORONARY ARTERY BYPASS GRAFT  2006   CABG X?; in [place]  . CYSTECTOMY  1990's   off face  . HERNIA REPAIR     inguinal hernia  . LEFT HEART CATH AND CORONARY ANGIOGRAPHY N/A 07/13/2019   Procedure: LEFT HEART CATH AND CORONARY ANGIOGRAPHY;  Surgeon: Nelva Bush, MD;  Location: [place] CV LAB;  Service: Cardiovascular;  Laterality: N/A;  . LEFT HEART CATHETERIZATION WITH CORONARY/GRAFT ANGIOGRAM N/A 09/16/2012   Procedure: LEFT HEART CATHETERIZATION WITH [name];  Surgeon: [name], MD;  Location: [facility] CATH LAB;  Service: Cardiovascular;  Laterality: N/A;  . LEFT HEART CATHETERIZATION WITH CORONARY/GRAFT ANGIOGRAM N/A 12/02/2013   Procedure: LEFT HEART CATHETERIZATION WITH [name];   Surgeon: [name], MD;  Location: [place] CATH LAB;  Service: Cardiovascular;  Laterality: N/A;  . PERCUTANEOUS CORONARY STENT INTERVENTION (PCI-S) N/A 12/03/2013   Procedure: PERCUTANEOUS CORONARY STENT INTERVENTION (PCI-S);  Surgeon: [name], MD;  Location: [facility] CATH LAB;  Service: Cardiovascular;  Laterality: N/A;  . RIGHT/LEFT HEART CATH AND CORONARY/GRAFT ANGIOGRAPHY N/A 06/23/2018   Procedure: RIGHT/LEFT HEART CATH AND CORONARY/GRAFT ANGIOGRAPHY;  Surgeon: [name], MD;  Location: [place] CV LAB;  Service: Cardiovascular;  Laterality: N/A;       Family History  Problem Relation Age of Onset  . Lung cancer Mother   . Anemia Mother   . Polycystic kidney disease Mother   . Diabetes Father   . Heart attack Father 63       Died suddenly  . Heart failure Father   . Hyperlipidemia Father   . Hypertension Father   . Kidney failure Sister 5       Died  . Heart attack Sister   .  Hypertension Sister   . Diabetes Sister   . Diabetes Brother   . Polycystic kidney disease Brother   . Diabetes Sister     Social History   Tobacco Use  . Smoking status: Former Smoker    Types: Cigarettes  . Smokeless tobacco: Never Used  . Tobacco comment: smoked some as a teenager (high school) not even a pack a week  Substance Use Topics  . Alcohol use: No    Alcohol/week: 0.0 standard drinks    Comment: 09/15/2012 "drank a little when I was young"  . Drug use: No    Home Medications Prior to Admission medications   Medication Sig Start Date End Date Taking? Authorizing Provider  acetaminophen (TYLENOL) 500 MG tablet Take 500 mg by mouth 2 (two) times daily as needed (for pain or headaches).     [provider]  allopurinol (ZYLOPRIM) 100 MG tablet Take 50 mg by mouth 2 (two) times daily.     [provider]  atorvastatin (LIPITOR) 80 MG tablet Take 1 tablet (80 mg total) by mouth daily. 08/26/17   Charlie Pitter, PA-C  Calcium Acetate 667 MG TABS  Take 667 mg by mouth See admin instructions. Takes 1 tablet in the morning, and 2 tablets in the evening. 10/13/19   [provider]  carvedilol (COREG) 25 MG tablet Take 1 tablet (25 mg total) by mouth 2 (two) times daily with a meal. Skip AM dose on dialysis days 12/01/19   [name], MD  Cholecalciferol 2000 units TABS Take 2,000 Units by mouth daily.     [provider]  clopidogrel (PLAVIX) 75 MG tablet TAKE ONE TABLET BY MOUTH ONCE DAILY WITH BREAKFAST 12/25/16   End, Harrell Gave, MD  diphenhydrAMINE (BENADRYL) 25 MG tablet Take 25 mg by mouth See admin instructions. Takes on dialysis days for itching (M,W,F).    [provider]  furosemide (LASIX) 80 MG tablet  12/19/19   [provider]  gabapentin (NEURONTIN) 300 MG capsule Take 1 capsule (300 mg total) by mouth daily. 07/16/19 12/25/19  Barb Merino, MD  glucose blood (ACCU-CHEK AVIVA PLUS) test strip 1 each by Other route in the morning, at noon, and at bedtime. 09/29/19   [provider]  insulin aspart protamine- aspart (NOVOLOG MIX 70/30) (70-30) 100 UNIT/ML injection Inject 20 Units into the skin as needed (if blood sugar is over 180).     [provider]  isosorbide mononitrate (IMDUR) 60 MG 24 hr tablet Take 1 tablet (60 mg total) by mouth 2 (two) times daily. 07/16/19 12/25/19  Barb Merino, MD  lidocaine-prilocaine (EMLA) cream Apply 1 application topically 3 (three) times a week. 12/01/19   [provider]  Misc. Devices MISC by Does not apply route. C-PAP    [provider]  Multiple Vitamin (MULTIVITAMIN WITH MINERALS) TABS tablet Take 1 tablet by mouth daily.    [provider]  nitroGLYCERIN (NITROSTAT) 0.4 MG SL tablet Place 1 tablet (0.4 mg total) under the tongue every 5 (five) minutes as needed for chest pain. 06/06/18   End, Harrell Gave, MD  pantoprazole (PROTONIX) 40 MG tablet Take 1 tablet (40 mg total) by mouth daily. 12/25/19   [name], MD  ranolazine (RANEXA) 500 MG 12 hr tablet Take 1 tablet (500 mg total) by mouth 2 (two) times daily. 03/29/17   End, Harrell Gave, MD  Semaglutide (OZEMPIC, 0.25 OR 0.5 MG/DOSE, [place]) Inject 0.25 mg into the skin every Saturday.  [provider]  traMADol (ULTRAM) 50 MG tablet Take 1 tablet (50 mg total) by mouth every 12 (twelve) hours as needed for moderate pain. 06/26/18   Patrecia Pour, MD  vitamin B-12 (CYANOCOBALAMIN) 1000 MCG tablet Take 1,000 mcg by mouth See admin instructions. On M,W,F - Dialysis days.    [provider]  zolpidem (AMBIEN) 10 MG tablet Take 10 mg by mouth at bedtime as needed for sleep.  09/30/17   [provider]    Allergies    Cleocin [clindamycin hcl], Glimepiride [amaryl], Clarithromycin, Colchicine, and Ciprocin-fluocin-procin [fluocinolone acetonide]  Review of Systems   Review of Systems  Constitutional: Negative.   HENT: Negative.   Respiratory: Negative.   Cardiovascular: Negative.   Gastrointestinal: Negative.   Genitourinary: Negative.   Musculoskeletal: Negative for arthralgias, back pain, gait problem, joint swelling, myalgias, neck pain and neck stiffness.       Bil Leg pain  Skin: Negative.   Neurological: Negative.  Negative for weakness.  All other systems reviewed and are negative.  Physical Exam Updated Vital Signs BP 111/62 (BP Location: Right Arm)   Pulse 70   Temp 98.6 F (37 C) (Oral)   Resp 15   SpO2 100%   Physical Exam Vitals and nursing note reviewed.  Constitutional:      General: He is not in acute distress.    Appearance: He is well-developed. He is not ill-appearing, toxic-appearing or diaphoretic.  HENT:     Head: Normocephalic and atraumatic.     Mouth/Throat:     Mouth: Mucous membranes are moist.  Eyes:     Pupils: Pupils are equal, round, and reactive to light.  Cardiovascular:     Rate and Rhythm: Normal rate and regular rhythm.     Pulses: Normal pulses.     Heart  sounds: Normal heart sounds.  Pulmonary:     Effort: Pulmonary effort is normal. No respiratory distress.     Breath sounds: Normal breath sounds.  Abdominal:     General: Bowel sounds are normal. There is distension.     Palpations: Abdomen is soft.  Musculoskeletal:        General: No swelling, tenderness, deformity or signs of injury. Normal range of motion.     Cervical back: Normal range of motion and neck supple.     Right lower leg: No edema.     Left lower leg: No edema.     Comments: Moves all 4 extremities without difficulty.  Bilateral lower extremities without difficulty. No bony tenderness, no edema  Skin:    General: Skin is warm and dry.     Comments: Brisk capillary refill.  Tactile temperature to extremities.  Neurological:     General: No focal deficit present.     Mental Status: He is alert.     Cranial Nerves: Cranial nerves are intact.     Sensory: Sensation is intact.     Motor: Motor function is intact. No weakness, tremor or abnormal muscle tone.     Coordination: Coordination is intact.     Gait: Gait is intact.     Deep Tendon Reflexes: Reflexes are normal and symmetric.     Comments: Cranial nerves II through XII grossly intact. 5/5 strength of bilateral lower extremities with flexion and extension Intact sensation to bilateral lower extremities Ambulatory without ataxic gait Negative heel-to-shin, Romberg No tremor No clonus    ED Results / Procedures / Treatments   Labs (all labs ordered are listed,  but only abnormal results are displayed) Labs Reviewed  BASIC METABOLIC PANEL - Abnormal; Notable for the following components:      Result Value   Sodium 134 (*)    Chloride 92 (*)    Glucose, Bld 130 (*)    Creatinine, Ser 4.19 (*)    Calcium 8.7 (*)    GFR calc non Af Amer 14 (*)    GFR calc Af Amer 17 (*)    All other components within normal limits  CBC - Abnormal; Notable for the following components:   RDW 17.8 (*)    All other  components within normal limits  CBG MONITORING, ED - Abnormal; Notable for the following components:   Glucose-Capillary 108 (*)    All other components within normal limits  CBG MONITORING, ED - Abnormal; Notable for the following components:   Glucose-Capillary 144 (*)    All other components within normal limits  CBG MONITORING, ED - Abnormal; Notable for the following components:   Glucose-Capillary 103 (*)    All other components within normal limits  URINALYSIS, ROUTINE W REFLEX MICROSCOPIC    EKG EKG Interpretation  Date/Time:  Monday Feb 01 2020 13:26:16 EDT Ventricular Rate:  67 PR Interval:  206 QRS Duration: 120 QT Interval:  462 QTC Calculation: 488 R Axis:   -52 Text Interpretation: Normal sinus rhythm Possible Left atrial enlargement Left axis deviation Inferior infarct , age undetermined Anterolateral infarct , age undetermined Abnormal ECG No significant change since last tracing Confirmed by Addison Lank [phone]) on 02/01/2020 10:58:30 PM   Radiology No results found.  Procedures Procedures (including critical care time)  Medications Ordered in ED Medications  sodium chloride flush (NS) 0.9 % injection 3 mL (has no administration in time range)   ED Course  I have reviewed the triage vital signs and the nursing notes.  Pertinent labs & imaging results that were available during my care of the patient were reviewed by me and considered in my medical decision making (see chart for details).  59 year old male presents for evaluation of trouble walking  He is afebrile, nonseptic, non-ill-appearing.  Unsure of timing as triage but states symptoms started on Saturday, patient family minus evaluation states symptoms started after dialysis and then further stated symptom started prior to dialysis today.  IV drug use, bowel or bladder incontinence, saddle paresthesia or malignancy.  He does not appear fluid overloaded in his extremities.  No unilateral leg swelling,  redness or warmth.  Had full dialysis session today.  Nonfocal neuro exam without deficits.  I was able to ambulate patient at bedside without difficulty.  No bony tenderness.   Labs obtained from triage which I personally reviewed: CBC without leukocytosis Metabolic panel with mild hyperglycemia to 130, creatinine 4.19 similar to prior EKG without STEMI  Patient assessed by attending Dr. Leonette Monarch.  Apparently patient states symptoms had started on Saturday and he also missed dialysis at that time.  Did go today and completed full session  Symptoms improved after his dialysis.  Reassuring he has continued to be neurovascularly intact bilaterally.  Patient continues to be ambulatory here in ED.  I have low suspicion for acute spinal cord injury or neurosurgical emergency.  Suspect prior metabolic derangement given symptoms resolved after dialysis.  We will have included close follow-up and follow-up outpatient.  He may return for any worsening symptoms.  The patient has been appropriately medically screened and/or stabilized in the ED. I have low suspicion for any other emergent medical  condition which would require further screening, evaluation or treatment in the ED or require inpatient management.  Patient is hemodynamically stable and in no acute distress.  Patient able to ambulate in department prior to ED.  Evaluation does not show acute pathology that would require ongoing or additional emergent interventions while in the emergency department or further inpatient treatment.  I have discussed the diagnosis with the patient and answered all questions.  Pain is been managed while in the emergency department and patient has no further complaints prior to discharge.  Patient is comfortable with plan discussed in room and is stable for discharge at this time.  I have discussed strict return precautions for returning to the emergency department.  Patient was encouraged to follow-up with PCP/specialist  refer to at discharge.   MDM Rules/Calculators/A&P                       Final Clinical Impression(s) / ED Diagnoses Final diagnoses:  Leg pain, bilateral    Rx / DC Orders ED Discharge Orders    None       Lelah Rennaker A, PA-C 02/02/20 0312    Fatima Blank, MD 02/02/20 [phone]

## 2020-02-02 NOTE — ED Triage Notes (Signed)
Patient here from home via EMS reporting bilateral leg pain x2 days. Seen at [facility] yesterday for same. Ambulatory with assistance.

## 2020-02-03 ENCOUNTER — Encounter (HOSPITAL_COMMUNITY): Payer: Self-pay | Admitting: Emergency Medicine

## 2020-02-03 ENCOUNTER — Emergency Department (HOSPITAL_COMMUNITY): Payer: No Typology Code available for payment source

## 2020-02-03 ENCOUNTER — Emergency Department (EMERGENCY_DEPARTMENT_HOSPITAL)
Admission: EM | Admit: 2020-02-03 | Discharge: 2020-02-03 | Disposition: A | Discharge status: Home / Self Care | Payer: No Typology Code available for payment source | Source: Home / Self Care | Attending: Emergency Medicine | Admitting: Emergency Medicine

## 2020-02-03 DIAGNOSIS — I132 Hypertensive heart and chronic kidney disease with heart failure and with stage 5 chronic kidney disease, or end stage renal disease: Secondary | ICD-10-CM | POA: Diagnosis not present

## 2020-02-03 DIAGNOSIS — I5042 Chronic combined systolic (congestive) and diastolic (congestive) heart failure: Secondary | ICD-10-CM | POA: Diagnosis not present

## 2020-02-03 DIAGNOSIS — M546 Pain in thoracic spine: Secondary | ICD-10-CM | POA: Diagnosis not present

## 2020-02-03 DIAGNOSIS — T426X1A Poisoning by other antiepileptic and sedative-hypnotic drugs, accidental (unintentional), initial encounter: Secondary | ICD-10-CM

## 2020-02-03 DIAGNOSIS — N186 End stage renal disease: Secondary | ICD-10-CM | POA: Diagnosis not present

## 2020-02-03 DIAGNOSIS — M79605 Pain in left leg: Secondary | ICD-10-CM | POA: Diagnosis not present

## 2020-02-03 DIAGNOSIS — Z79899 Other long term (current) drug therapy: Secondary | ICD-10-CM | POA: Diagnosis not present

## 2020-02-03 DIAGNOSIS — I252 Old myocardial infarction: Secondary | ICD-10-CM | POA: Diagnosis not present

## 2020-02-03 DIAGNOSIS — I2510 Atherosclerotic heart disease of native coronary artery without angina pectoris: Secondary | ICD-10-CM | POA: Diagnosis not present

## 2020-02-03 DIAGNOSIS — E1122 Type 2 diabetes mellitus with diabetic chronic kidney disease: Secondary | ICD-10-CM | POA: Diagnosis not present

## 2020-02-03 DIAGNOSIS — I130 Hypertensive heart and chronic kidney disease with heart failure and stage 1 through stage 4 chronic kidney disease, or unspecified chronic kidney disease: Secondary | ICD-10-CM | POA: Diagnosis not present

## 2020-02-03 DIAGNOSIS — Z7901 Long term (current) use of anticoagulants: Secondary | ICD-10-CM | POA: Diagnosis not present

## 2020-02-03 DIAGNOSIS — N184 Chronic kidney disease, stage 4 (severe): Secondary | ICD-10-CM | POA: Diagnosis not present

## 2020-02-03 DIAGNOSIS — M79604 Pain in right leg: Secondary | ICD-10-CM

## 2020-02-03 DIAGNOSIS — M6281 Muscle weakness (generalized): Secondary | ICD-10-CM | POA: Diagnosis not present

## 2020-02-03 DIAGNOSIS — R29898 Other symptoms and signs involving the musculoskeletal system: Secondary | ICD-10-CM

## 2020-02-03 DIAGNOSIS — Z992 Dependence on renal dialysis: Secondary | ICD-10-CM | POA: Diagnosis not present

## 2020-02-03 DIAGNOSIS — Z87891 Personal history of nicotine dependence: Secondary | ICD-10-CM | POA: Diagnosis not present

## 2020-02-03 LAB — CBC WITH DIFFERENTIAL/PLATELET
Abs Immature Granulocytes: 0.02 10*3/uL (ref 0.00–0.07)
Basophils Absolute: 0 10*3/uL (ref 0.0–0.1)
Basophils Relative: 1 %
Eosinophils Absolute: 0.1 10*3/uL (ref 0.0–0.5)
Eosinophils Relative: 1 %
HCT: 40.7 % (ref 39.0–52.0)
Hemoglobin: 13.2 g/dL (ref 13.0–17.0)
Immature Granulocytes: 0 %
Lymphocytes Relative: 18 %
Lymphs Abs: 1.3 10*3/uL (ref 0.7–4.0)
MCH: 29.5 pg (ref 26.0–34.0)
MCHC: 32.4 g/dL (ref 30.0–36.0)
MCV: 91.1 fL (ref 80.0–100.0)
Monocytes Absolute: 0.8 10*3/uL (ref 0.1–1.0)
Monocytes Relative: 11 %
Neutro Abs: 4.9 10*3/uL (ref 1.7–7.7)
Neutrophils Relative %: 69 %
Platelets: 159 10*3/uL (ref 150–400)
RBC: 4.47 MIL/uL (ref 4.22–5.81)
RDW: 18.1 % — ABNORMAL HIGH (ref 11.5–15.5)
WBC: 7.2 10*3/uL (ref 4.0–10.5)
nRBC: 0 % (ref 0.0–0.2)

## 2020-02-03 LAB — RENAL FUNCTION PANEL
Albumin: 3.5 g/dL (ref 3.5–5.0)
Anion gap: 14 (ref 5–15)
BUN: 40 mg/dL — ABNORMAL HIGH (ref 6–20)
CO2: 26 mmol/L (ref 22–32)
Calcium: 9 mg/dL (ref 8.9–10.3)
Chloride: 93 mmol/L — ABNORMAL LOW (ref 98–111)
Creatinine, Ser: 6.52 mg/dL — ABNORMAL HIGH (ref 0.61–1.24)
GFR calc Af Amer: 10 mL/min — ABNORMAL LOW (ref >60)
GFR calc non Af Amer: 8 mL/min — ABNORMAL LOW (ref >60)
Glucose, Bld: 93 mg/dL (ref 70–99)
Phosphorus: 4.9 mg/dL — ABNORMAL HIGH (ref 2.5–4.6)
Potassium: 4.8 mmol/L (ref 3.5–5.1)
Sodium: 133 mmol/L — ABNORMAL LOW (ref 135–145)

## 2020-02-03 LAB — I-STAT CHEM 8, ED
BUN: 55 mg/dL — ABNORMAL HIGH (ref 6–20)
Calcium, Ion: 1.12 mmol/L — ABNORMAL LOW (ref 1.15–1.40)
Chloride: 91 mmol/L — ABNORMAL LOW (ref 98–111)
Creatinine, Ser: 7 mg/dL — ABNORMAL HIGH (ref 0.61–1.24)
Glucose, Bld: 106 mg/dL — ABNORMAL HIGH (ref 70–99)
HCT: 43 % (ref 39.0–52.0)
Hemoglobin: 14.6 g/dL (ref 13.0–17.0)
Potassium: 5.1 mmol/L (ref 3.5–5.1)
Sodium: 131 mmol/L — ABNORMAL LOW (ref 135–145)
TCO2: 36 mmol/L — ABNORMAL HIGH (ref 22–32)

## 2020-02-03 LAB — CBG MONITORING, ED: Glucose-Capillary: 124 mg/dL — ABNORMAL HIGH (ref 70–99)

## 2020-02-03 LAB — MAGNESIUM: Magnesium: 2 mg/dL (ref 1.7–2.4)

## 2020-02-03 LAB — PROTIME-INR
INR: 1.1 (ref 0.8–1.2)
Prothrombin Time: 13.6 seconds (ref 11.4–15.2)

## 2020-02-03 MED ORDER — FENTANYL CITRATE (PF) 100 MCG/2ML IJ SOLN
50.0000 ug | Freq: Once | INTRAMUSCULAR | Status: AC
  Administered 2020-02-03: 50 ug via INTRAVENOUS
  Filled 2020-02-03: qty 2

## 2020-02-03 NOTE — Discharge Instructions (Signed)
The imaging of your back did not show any concerning findings today.  We think that your weakness is likely due to the gabapentin you are taking.  Please stop taking this, at least for the time being.  You may be called by your dialysis center tomorrow if they have a spot available for you to dialyze, otherwise go at your normal time on Friday.  Please use the walker to help with mobility.  Return to the emergency department any concerns.

## 2020-02-03 NOTE — ED Provider Notes (Signed)
Patient from [facility] to come in for MRI.  Patient has been having bilateral lower extremity weakness x2 days.  Seen by Dr. Randal Buba, who consulted with Dr. Leonel Ramsay from neurology, who recommends MRI of complete spine.  Patient signed out to oncoming provider at shift change.  Plan: F/u on MRIs.  Neuro vs Neurosurg consult depending on findings.   Montine Circle, PA-C 73/71/06 [id number]    Delora Fuel, MD 85/46/27 [phone]

## 2020-02-03 NOTE — ED Notes (Signed)
Taken to MRI 

## 2020-02-03 NOTE — Discharge Planning (Signed)
Fuller Mandril, RN, BSN, [place] [phone] Pt qualifies for DME rolling walker.  DME  ordered through [employer].  Zack Blanl of [facility] notified to deliver rolling walker to pt room prior to D/C home.

## 2020-02-03 NOTE — Discharge Planning (Signed)
[employer] delivered rolling walker to pt.  Pt decided he wants rolling walker with seat instead.  [employer] will deliver RWWS to pt prior to discharge home.

## 2020-02-03 NOTE — ED Provider Notes (Addendum)
9:29 AM Signout from [place] PA-C at shift change.   Patient sent from [facility] overnight for MRI.  Patient has a history of end-stage renal disease and is on hemodialysis at the present time.  Prior to MRI, I was called by the tech regarding administration of gadolinium in ESRD patients.  This was discussed with Dr. Kathrynn Humble.  He spoke with radiologist and I spoke with Dr. Justin Mend, on-call for nephrology.  Dr. Justin Mend advises against the use of gadolinium in ESRD patients.  Will obtain imaging without contrast and follow-up with neurology when results return. This seems to be the option at this point unfortunately.   BP (!) 123/110 (BP Location: Right Arm)   Pulse 72   Temp 98.2 F (36.8 C) (Oral)   Resp 16   Ht 5' 5.5" (1.664 m)   Wt 99.8 kg   SpO2 100%   BMI 36.05 kg/m   10:22 AM I evaluated the patient after MRI. He has some myoclonus however currently normal sensation and normal strength in his lower extremities.  He states that his leg symptoms of numbness and "giving out" are intermittent, usually lasting about an hour.  MRI does not show any concerning central cord etiology of the patient's symptoms.  I discussed the case with Dr. Lorraine Lax of neurology.  Based on description, this could be metabolic.  Neurology will consult.  Added magnesium as this has not been checked.  12:17 PM neurology consult appreciated.  Current plan is to discontinue gabapentin as this is the likely cause of the patient's symptoms.  Case manager will provide a rolling walker for the patient, order placed.  From a dialysis standpoint, potassium, BUN, fluid status are all okay.  Do not feel that patient requires emergent dialysis at this time.  Patient's wife is at bedside and is contacting the dialysis center for further instructions.  After patient receives walker, plan for discharge home.  Patient is encouraged to follow-up with the prescribing physician regarding gabapentin to determine whether he should continue  taking this in the future.  BP 131/77 (BP Location: Right Arm)   Pulse 69   Temp 98.2 F (36.8 C) (Oral)   Resp 18   Ht 5' 5.5" (1.664 m)   Wt 99.8 kg   SpO2 100%   BMI 36.05 kg/m   1:22 PM I called patient's dialysis center ([facility] [address] regarding missed dialysis session today.  They will call patient tomorrow if they have any open spots tomorrow.  Otherwise patient should go on Friday morning at his usual time.   Carlisle Cater, PA-C 02/03/20 [place], Ankit, MD 02/04/20 1138

## 2020-02-03 NOTE — ED Notes (Signed)
Patient came from [facility] for MRI.  Patient stable.

## 2020-02-03 NOTE — ED Provider Notes (Signed)
[facility] DEPT Provider Note   CSN: [id number] Arrival date & time: 02/02/20  1845     History Chief Complaint  Patient presents with  . Leg Pain    Matthew Rowe is a 59 y.o. male.  The history is provided by the patient.  Leg Pain Location:  Leg Time since incident:  5 days Leg location:  L leg and R leg Pain details:    Quality:  Aching   Onset quality:  Gradual   Duration:  5 days   Timing:  Constant   Progression:  Unchanged Chronicity:  New Dislocation: no   Foreign body present:  No foreign bodies Worsened by:  Nothing Ineffective treatments:  None tried Associated symptoms: back pain, muscle weakness and numbness   Associated symptoms: no fever   Risk factors: no concern for non-accidental trauma   Patient with ESRD with back and leg pain and weakness since Saturday that is progressive. [facility] but was able to ambulate and was discharged.  Today has barely been able to walk.  Legs are numb and weak B.  Is MWF dialysis.       Past Medical History:  Diagnosis Date  . Acute on chronic combined systolic and diastolic CHF (congestive heart failure) ([place]) 10/15/2017  . Chronic anemia   . Chronic combined systolic and diastolic CHF (congestive heart failure) ([place])   . Chronic stable angina ([place])   . CKD (chronic kidney disease), stage IV ([place])   . Coronary artery disease    a. INF MI 1999: PCI of RCA;  b.  extensive stenting of LAD;  c. s/p CABG with RIMA->RCA in 2006;  d. Stable, Low risk MV 05/2012; e. 09/2012 Cath: stable anatomy->med Rx. f. Abnl nuc 11/2013 -> med rx; g. 11/2013 Cath/PCI: s/p DES to RCA. h. Abnormal nuc 11/2016, pt declined cath.   . DM2 (diabetes mellitus, type 2) (HCC)    Type II  . Dyslipidemia   . GERD (gastroesophageal reflux disease)   . History of gout   . Hypertension   . Hypertensive heart disease   . Ischemic cardiomyopathy   . Mild aortic stenosis   . Myocardial  infarction [facility]) 1999; 2002; 2003; 2006; ~ 2008  . OSA on CPAP    wears CPAP  . Polycystic kidney disease     Patient Active Problem List   Diagnosis Date Noted  . NSTEMI (non-ST elevated myocardial infarction) ([place]) 07/10/2019  . ESRD (end stage renal disease) ([place]) 07/10/2019  . Coronary artery disease involving native heart with unstable angina pectoris ([place])   . Acute respiratory failure ([place])   . Acute diastolic (congestive) heart failure ([place])   . Unstable angina ([place]) 06/18/2018  . Leukocytosis 06/18/2018  . Chronic pain 06/18/2018  . Insomnia 06/18/2018  . Chest pain 06/17/2018  . Chronic heart failure with preserved ejection fraction ([place]) 06/06/2018  . Anemia due to stage 4 chronic kidney disease ([place]) 02/12/2018  . Coronary artery disease of native heart with stable angina pectoris ([place]) 10/25/2017  . Chronic diastolic heart failure ([place]) 10/25/2017  . Chronic anemia 10/15/2017  . Elevated troponin 10/15/2017  . CKD (chronic kidney disease) stage 4, GFR 15-29 ml/min (HCC) 07/22/2017  . Hyperkalemia 04/01/2017  . Non-ST elevation (NSTEMI) myocardial infarction ([place]) 04/01/2017  . Chest pain with moderate risk of acute coronary syndrome 04/01/2017  . Hx of CABG   . Ischemic cardiomyopathy 02/21/2017  . HLD (hyperlipidemia)   .  GERD (gastroesophageal reflux disease) 05/08/2015  . Gout 05/08/2015  . MI (myocardial infarction) ([place]) 04/19/2015  . CAD- S/P multiple PCIs, CABG   . Hypertensive heart disease   . Precordial chest pain 11/27/2013  . Financial difficulty 03/13/2013  . Medically noncompliant 03/13/2013  . Type 2 diabetes with nephropathy ([place])   . Obesity-BMI 42 11/27/2012  . Polycystic kidney disease 09/15/2012  . OSA on CPAP 09/15/2012  . Hypertension 03/27/2011  . Hyperlipidemia associated with type 2 diabetes mellitus [facility])     Past Surgical History:  Procedure Laterality Date  . AV FISTULA PLACEMENT Left 05/29/2018   Procedure: ARTERIOVENOUS (AV)  FISTULA CREATION  LEFT ARM.;  Surgeon: Serafina Mitchell, MD;  Location: [facility];  Service: Vascular;  Laterality: Left;  . BASCILIC VEIN TRANSPOSITION Left 07/24/2018   Procedure: SECOND STAGE BASCILIC VEIN TRANSPOSITION LEFT ARM;  Surgeon: Serafina Mitchell, MD;  Location: [facility];  Service: Vascular;  Laterality: Left;  . CARDIAC CATHETERIZATION  2012  . CARDIAC CATHETERIZATION  2013  . CARDIAC CATHETERIZATION  2014   LHC (1/14): total occlusion of distal LAD, diffuse disease in ramus, 70% proximal LCx, 50% proximal and mid RCA, 50% distal RCA, patent RIMA-RCA.  Marland Kitchen CORONARY ANGIOPLASTY WITH STENT PLACEMENT  1999 / 2002 / 2004 / 2006?  . CORONARY ARTERY BYPASS GRAFT  2006   CABG X?; in [place]  . CYSTECTOMY  1990's   off face  . HERNIA REPAIR     inguinal hernia  . LEFT HEART CATH AND CORONARY ANGIOGRAPHY N/A 07/13/2019   Procedure: LEFT HEART CATH AND CORONARY ANGIOGRAPHY;  Surgeon: Nelva Bush, MD;  Location: [facility] CV LAB;  Service: Cardiovascular;  Laterality: N/A;  . LEFT HEART CATHETERIZATION WITH CORONARY/GRAFT ANGIOGRAM N/A 09/16/2012   Procedure: LEFT HEART CATHETERIZATION WITH [name];  Surgeon: [name], MD;  Location: [facility] CATH LAB;  Service: Cardiovascular;  Laterality: N/A;  . LEFT HEART CATHETERIZATION WITH CORONARY/GRAFT ANGIOGRAM N/A 12/02/2013   Procedure: LEFT HEART CATHETERIZATION WITH [name];  Surgeon: [name], MD;  Location: [facility] CATH LAB;  Service: Cardiovascular;  Laterality: N/A;  . PERCUTANEOUS CORONARY STENT INTERVENTION (PCI-S) N/A 12/03/2013   Procedure: PERCUTANEOUS CORONARY STENT INTERVENTION (PCI-S);  Surgeon: [name], MD;  Location: [facility] CATH LAB;  Service: Cardiovascular;  Laterality: N/A;  . RIGHT/LEFT HEART CATH AND CORONARY/GRAFT ANGIOGRAPHY N/A 06/23/2018   Procedure: RIGHT/LEFT HEART CATH AND CORONARY/GRAFT ANGIOGRAPHY;  Surgeon: [name], MD;  Location: [facility] CV LAB;  Service:  Cardiovascular;  Laterality: N/A;       Family History  Problem Relation Age of Onset  . Lung cancer Mother   . Anemia Mother   . Polycystic kidney disease Mother   . Diabetes Father   . Heart attack Father 47       Died suddenly  . Heart failure Father   . Hyperlipidemia Father   . Hypertension Father   . Kidney failure Sister 5       Died  . Heart attack Sister   . Hypertension Sister   . Diabetes Sister   . Diabetes Brother   . Polycystic kidney disease Brother   . Diabetes Sister     Social History   Tobacco Use  . Smoking status: Former Smoker    Types: Cigarettes  . Smokeless tobacco: Never Used  . Tobacco comment: smoked some as a teenager (high school) not even a pack a week  Substance Use Topics  . Alcohol use: No  Alcohol/week: 0.0 standard drinks    Comment: 09/15/2012 "drank a little when I was young"  . Drug use: No    Home Medications Prior to Admission medications   Medication Sig Start Date End Date Taking? Authorizing Provider  acetaminophen (TYLENOL) 500 MG tablet Take 500 mg by mouth 2 (two) times daily as needed (for pain or headaches).     [provider]  allopurinol (ZYLOPRIM) 100 MG tablet Take 50 mg by mouth 2 (two) times daily.     [provider]  atorvastatin (LIPITOR) 80 MG tablet Take 1 tablet (80 mg total) by mouth daily. 08/26/17   Charlie Pitter, PA-C  Calcium Acetate 667 MG TABS Take 667 mg by mouth See admin instructions. Takes 1 tablet in the morning, and 2 tablets in the evening. 10/13/19   [provider]  carvedilol (COREG) 25 MG tablet Take 1 tablet (25 mg total) by mouth 2 (two) times daily with a meal. Skip AM dose on dialysis days 12/01/19   [name], MD  Cholecalciferol 2000 units TABS Take 2,000 Units by mouth daily.     [provider]  clopidogrel (PLAVIX) 75 MG tablet TAKE ONE TABLET BY MOUTH ONCE DAILY WITH BREAKFAST 12/25/16   End, Harrell Gave, MD  diphenhydrAMINE (BENADRYL)  25 MG tablet Take 25 mg by mouth See admin instructions. Takes on dialysis days for itching (M,W,F).    [provider]  furosemide (LASIX) 80 MG tablet  12/19/19   [provider]  gabapentin (NEURONTIN) 300 MG capsule Take 1 capsule (300 mg total) by mouth daily. 07/16/19 12/25/19  Barb Merino, MD  glucose blood (ACCU-CHEK AVIVA PLUS) test strip 1 each by Other route in the morning, at noon, and at bedtime. 09/29/19   [provider]  insulin aspart protamine- aspart (NOVOLOG MIX 70/30) (70-30) 100 UNIT/ML injection Inject 20 Units into the skin as needed (if blood sugar is over 180).     [provider]  isosorbide mononitrate (IMDUR) 60 MG 24 hr tablet Take 1 tablet (60 mg total) by mouth 2 (two) times daily. 07/16/19 12/25/19  Barb Merino, MD  lidocaine-prilocaine (EMLA) cream Apply 1 application topically 3 (three) times a week. 12/01/19   [provider]  Misc. Devices MISC by Does not apply route. C-PAP    [provider]  Multiple Vitamin (MULTIVITAMIN WITH MINERALS) TABS tablet Take 1 tablet by mouth daily.    [provider]  nitroGLYCERIN (NITROSTAT) 0.4 MG SL tablet Place 1 tablet (0.4 mg total) under the tongue every 5 (five) minutes as needed for chest pain. 06/06/18   End, Harrell Gave, MD  pantoprazole (PROTONIX) 40 MG tablet Take 1 tablet (40 mg total) by mouth daily. 12/25/19   [name], MD  ranolazine (RANEXA) 500 MG 12 hr tablet Take 1 tablet (500 mg total) by mouth 2 (two) times daily. 03/29/17   End, Harrell Gave, MD  Semaglutide (OZEMPIC, 0.25 OR 0.5 MG/DOSE, [place]) Inject 0.25 mg into the skin every Saturday.    [provider]  traMADol (ULTRAM) 50 MG tablet Take 1 tablet (50 mg total) by mouth every 12 (twelve) hours as needed for moderate pain. 06/26/18   Patrecia Pour, MD  vitamin B-12 (CYANOCOBALAMIN) 1000 MCG tablet Take 1,000 mcg by mouth See admin instructions. On M,W,F - Dialysis days.     [provider]  zolpidem (AMBIEN) 10 MG tablet Take 10 mg by mouth at bedtime as needed for sleep.  09/30/17  [provider]    Allergies    Cleocin [clindamycin hcl], Glimepiride [amaryl], Clarithromycin, Colchicine, and Ciprocin-fluocin-procin [fluocinolone acetonide]  Review of Systems   Review of Systems  Constitutional: Negative for fever.  HENT: Negative for congestion.   Eyes: Negative for visual disturbance.  Respiratory: Negative for shortness of breath.   Cardiovascular: Negative for chest pain.  Gastrointestinal: Negative for nausea and vomiting.  Endocrine: Negative for polyuria.  Genitourinary: Negative for frequency.  Musculoskeletal: Positive for arthralgias and back pain.  Skin: Negative for rash.  Neurological: Positive for weakness and numbness.  Psychiatric/Behavioral: Negative for agitation.  All other systems reviewed and are negative.   Physical Exam Updated Vital Signs BP 128/82 (BP Location: Left Arm)   Pulse 68   Temp 97.6 F (36.4 C) (Oral)   Resp 18   Ht 5' 5.5" (1.664 m)   Wt 99.8 kg   SpO2 100%   BMI 36.05 kg/m   Physical Exam Vitals and nursing note reviewed.  Constitutional:      General: He is not in acute distress.    Appearance: Normal appearance.  HENT:     Head: Normocephalic and atraumatic.     Nose: Nose normal.  Eyes:     Conjunctiva/sclera: Conjunctivae normal.     Pupils: Pupils are equal, round, and reactive to light.  Cardiovascular:     Rate and Rhythm: Normal rate and regular rhythm.     Pulses: Normal pulses.     Heart sounds: Normal heart sounds.  Pulmonary:     Effort: Pulmonary effort is normal.     Breath sounds: Normal breath sounds.  Abdominal:     General: Bowel sounds are normal.     Palpations: Abdomen is soft.     Tenderness: There is no abdominal tenderness. There is no guarding.  Musculoskeletal:        General: No tenderness or deformity.  Skin:    General: Skin is warm and  dry.     Capillary Refill: Capillary refill takes less than 2 seconds.  Neurological:     General: No focal deficit present.     Mental Status: He is alert and oriented to person, place, and time.     Sensory: Sensory deficit present.     Gait: Gait abnormal.  Psychiatric:        Mood and Affect: Mood normal.        Behavior: Behavior normal.     ED Results / Procedures / Treatments   Labs (all labs ordered are listed, but only abnormal results are displayed) Labs Reviewed  CBC WITH DIFFERENTIAL/PLATELET  PROTIME-INR  I-STAT CHEM 8, ED    EKG None  Radiology No results found.  Procedures Procedures (including critical care time)  Medications Ordered in ED   ED Course  I have reviewed the triage vital signs and the nursing notes.  Pertinent labs & imaging results that were available during my care of the patient were reviewed by me and considered in my medical decision making (see chart for details).     215 case d/w Dr. Leonel Ramsay, MRI w/w/o of the C/T spine.  Neuro will be happy to see if no cord compression requiring neurosurgery.    009 case d/w Dr. Roxanne Mins who will accept the patient in transfer     [place], Matthew Spellman, MD 02/03/20 [phone]

## 2020-02-03 NOTE — Consult Note (Signed)
Neurology Consultation  Reason for Consult: Twitching/myoclonus Referring Physician: Kathrynn Humble  CC: Possible myoclonus  History is obtained from:  patient  HPI: Matthew Rowe. is a 59 y.o. male   sleep apnea on CPAP, myocardial infarct, ischemic cardiomyopathy, hypertension, GERD, dyslipidemia, diabetes, chronic kidney disease with hemodialysis, CHF.  Since Saturday he has been having abnormal twitching of bilateral legs and upper extremities at times the point that he cannot walk.  He also has periods in which he states he cannot feel his leg from his foot up to his groin bilaterally.  It does fluctuate from day-to-day per wife.  He states they are also days that he cannot even walk.  At this point he needs to use a wheelchair.  Wife also made mention that 2 of his medications had been changed on Friday 1 was ranolazine the second was his Neurontin.  Apparently he had been taking 1 300 mg capsule a day and they opted to 3 times a day per patient.  It was the next day that he started noting abnormal twitching.  Currently he has some twitching and feels as though he is very weak in his legs.  He states he has no other current issues.   ED course  Patient has had MRI of cervical spine, thoracic spine and lumbar spine which showed no cervical, thoracic or lumbar spinal stenosis.  No spinal cord or cauda equina abnormality identified.  Chart review (patient has seen Dr. Rexene Alberts for sleep apnea in the past.)  Past Medical History:  Diagnosis Date  . Acute on chronic combined systolic and diastolic CHF (congestive heart failure) ([place]) 10/15/2017  . Chronic anemia   . Chronic combined systolic and diastolic CHF (congestive heart failure) ([place])   . Chronic stable angina ([place])   . CKD (chronic kidney disease), stage IV ([place])   . Coronary artery disease    a. INF MI 1999: PCI of RCA;  b.  extensive stenting of LAD;  c. s/p CABG with RIMA->RCA in 2006;  d. Stable, Low risk MV 05/2012; e. 09/2012 Cath:  stable anatomy->med Rx. f. Abnl nuc 11/2013 -> med rx; g. 11/2013 Cath/PCI: s/p DES to RCA. h. Abnormal nuc 11/2016, pt declined cath.   . DM2 (diabetes mellitus, type 2) ([facility])    Type II  . Dyslipidemia   . GERD (gastroesophageal reflux disease)   . History of gout   . Hypertension   . Hypertensive heart disease   . Ischemic cardiomyopathy   . Mild aortic stenosis   . Myocardial infarction [facility]) 1999; 2002; 2003; 2006; ~ 2008  . OSA on CPAP    wears CPAP  . Polycystic kidney disease      Family History  Problem Relation Age of Onset  . Lung cancer Mother   . Anemia Mother   . Polycystic kidney disease Mother   . Diabetes Father   . Heart attack Father 36       Died suddenly  . Heart failure Father   . Hyperlipidemia Father   . Hypertension Father   . Kidney failure Sister 5       Died  . Heart attack Sister   . Hypertension Sister   . Diabetes Sister   . Diabetes Brother   . Polycystic kidney disease Brother   . Diabetes Sister    Social History:   reports that he has quit smoking. His smoking use included cigarettes. He has never used smokeless tobacco. He reports that he does not  drink alcohol or use drugs.  Medications No current facility-administered medications for this encounter.  Current Outpatient Medications:  .  acetaminophen (TYLENOL) 500 MG tablet, Take 500 mg by mouth 2 (two) times daily as needed (for pain or headaches). , Disp: , Rfl:  .  allopurinol (ZYLOPRIM) 100 MG tablet, Take 50 mg by mouth 2 (two) times daily. , Disp: , Rfl:  .  atorvastatin (LIPITOR) 80 MG tablet, Take 1 tablet (80 mg total) by mouth daily., Disp: 90 tablet, Rfl: 1 .  Calcium Acetate 667 MG TABS, Take 667 mg by mouth See admin instructions. Takes 1 tablet in the morning, and 2 tablets in the evening., Disp: , Rfl:  .  carvedilol (COREG) 25 MG tablet, Take 1 tablet (25 mg total) by mouth 2 (two) times daily with a meal. Skip AM dose on dialysis days (Patient taking differently: Take  25 mg by mouth 2 (two) times daily with a meal. ), Disp: 180 tablet, Rfl: 3 .  Cholecalciferol 2000 units TABS, Take 2,000 Units by mouth daily. , Disp: , Rfl:  .  clopidogrel (PLAVIX) 75 MG tablet, TAKE ONE TABLET BY MOUTH ONCE DAILY WITH BREAKFAST (Patient taking differently: Take 75 mg by mouth daily. ), Disp: 90 tablet, Rfl: 1 .  diphenhydrAMINE (BENADRYL) 25 MG tablet, Take 25 mg by mouth See admin instructions. Takes on dialysis days for itching (M,W,F)., Disp: , Rfl:  .  furosemide (LASIX) 80 MG tablet, Take 80 mg by mouth as needed for fluid. , Disp: , Rfl:  .  gabapentin (NEURONTIN) 300 MG capsule, Take 1 capsule (300 mg total) by mouth daily. (Patient taking differently: Take 300 mg by mouth 3 (three) times daily. ), Disp: 30 capsule, Rfl: 0 .  glucose blood (ACCU-CHEK AVIVA PLUS) test strip, 1 each by Other route in the morning, at noon, and at bedtime., Disp: , Rfl:  .  isosorbide mononitrate (IMDUR) 60 MG 24 hr tablet, Take 1 tablet (60 mg total) by mouth 2 (two) times daily., Disp: 60 tablet, Rfl: 0 .  lidocaine-prilocaine (EMLA) cream, Apply 1 application topically 3 (three) times a week., Disp: , Rfl:  .  Misc. Devices MISC, by Does not apply route. C-PAP, Disp: , Rfl:  .  Multiple Vitamin (MULTIVITAMIN WITH MINERALS) TABS tablet, Take 1 tablet by mouth daily., Disp: , Rfl:  .  nitroGLYCERIN (NITROSTAT) 0.4 MG SL tablet, Place 1 tablet (0.4 mg total) under the tongue every 5 (five) minutes as needed for chest pain., Disp: 25 tablet, Rfl: 5 .  pantoprazole (PROTONIX) 40 MG tablet, Take 1 tablet (40 mg total) by mouth daily., Disp: 90 tablet, Rfl: 3 .  ranolazine (RANEXA) 500 MG 12 hr tablet, Take 1 tablet (500 mg total) by mouth 2 (two) times daily. (Patient taking differently: Take 1,000 mg by mouth 2 (two) times daily. ), Disp: 180 tablet, Rfl: 3 .  Semaglutide (OZEMPIC, 0.25 OR 0.5 MG/DOSE, [place]), Inject 0.25 mg into the skin every Saturday., Disp: , Rfl:  .  traMADol (ULTRAM) 50 MG  tablet, Take 1 tablet (50 mg total) by mouth every 12 (twelve) hours as needed for moderate pain., Disp: 12 tablet, Rfl: 0 .  vitamin B-12 (CYANOCOBALAMIN) 1000 MCG tablet, Take 1,000 mcg by mouth See admin instructions. On M,W,F - Dialysis days., Disp: , Rfl:  .  zolpidem (AMBIEN) 10 MG tablet, Take 10 mg by mouth at bedtime as needed for sleep. , Disp: , Rfl:  .  insulin aspart protamine- aspart (NOVOLOG  MIX 70/30) (70-30) 100 UNIT/ML injection, Inject 20 Units into the skin as needed (if blood sugar is over 180). , Disp: , Rfl:  .  multivitamin (RENA-VIT) TABS tablet, Take 1 tablet by mouth daily., Disp: , Rfl:   ROS:    General ROS: negative for - chills, fatigue, fever, night sweats, weight gain or weight loss Psychological ROS: negative for - behavioral disorder, hallucinations, memory difficulties, mood swings or suicidal ideation Ophthalmic ROS: negative for - blurry vision, double vision, eye pain or loss of vision ENT ROS: negative for - epistaxis, nasal discharge, oral lesions, sore throat, tinnitus or vertigo Allergy and Immunology ROS: negative for - hives or itchy/watery eyes Hematological and Lymphatic ROS: negative for - bleeding problems, bruising or swollen lymph nodes Endocrine ROS: negative for - galactorrhea, hair pattern changes, polydipsia/polyuria or temperature intolerance Respiratory ROS: negative for - cough, hemoptysis, shortness of breath or wheezing Cardiovascular ROS: negative for - chest pain, dyspnea on exertion, edema or irregular heartbeat Gastrointestinal ROS: negative for - abdominal pain, diarrhea, hematemesis, nausea/vomiting or stool incontinence Genito-Urinary ROS: negative for - dysuria, hematuria, incontinence or urinary frequency/urgency Musculoskeletal ROS: Positive for -muscle pain  neurological ROS: as noted in HPI Dermatological ROS: negative for rash and skin lesion changes  Exam: Current vital signs: BP 131/77 (BP Location: Right Arm)    Pulse 69   Temp 98.2 F (36.8 C) (Oral)   Resp 18   Ht 5' 5.5" (1.664 m)   Wt 99.8 kg   SpO2 100%   BMI 36.05 kg/m  Vital signs in last 24 hours: Temp:  [97.6 F (36.4 C)-98.2 F (36.8 C)] 98.2 F (36.8 C) (05/26 0413) Pulse Rate:  [68-88] 69 (05/26 1042) Resp:  [16-18] 18 (05/26 1042) BP: (115-133)/(54-110) 131/77 (05/26 1042) SpO2:  [94 %-100 %] 100 % (05/26 1042) Weight:  [99.8 kg] 99.8 kg (05/26 0206)   Constitutional: Appears well-developed and well-nourished.  Psych: Affect appropriate to situation Eyes: No scleral injection HENT: No OP obstrucion Head: Normocephalic.  Cardiovascular: Normal rate and regular rhythm.  Respiratory: Effort normal, non-labored breathing GI: Soft.  No distension. There is no tenderness.  Skin: WDI  Neuro: Mental Status: Patient is awake, alert, oriented to person, place, month, year, and situation. Speech-intact naming, repeating and comprehension.  No dysarthria or aphasia.  Able to follow all commands.  Able to give a good coherent history Cranial Nerves: II: Visual Fields are full.  III,IV, VI: EOMI without ptosis or diploplia. Pupils equal, round and reactive to light V: Facial sensation is symmetric to temperature VII: Facial movement is symmetric.  VIII: hearing is intact to voice X: Palat elevates symmetrically XI: Shoulder shrug is symmetric. XII: tongue is midline without atrophy or fasciculations.  Motor: Tone is normal. Bulk is normal. 5/5 strength was present in all four extremities.  Intermittently patient will have fine twitching of bilateral legs and hands.  However if patient is distracted I do note that the twitching stops.  Patient also states that he is unable to stand up on his own.  However upon standing I am he is able to bend his knees to approximately 30 degrees and hold himself up.  Of note when initially going to see the patient and standing watching him talk on the phone I did not see any twitching and he was  able to talk on the phone with no difficulty.  However during objective physical exam patient was handed to his phone and he started twitching and dropped it.  Sensory: Sensation is symmetric to light touch and temperature in the arms and legs.  Patient does have peripheral neuropathy in a stocking distribution Deep Tendon Reflexes: 1+ and symmetric in the biceps and patellae.  Plantars: Mute bilaterally Cerebellar: FNF and HKS are intact bilaterally  Labs I have reviewed labs in epic and the results pertinent to this consultation are:   CBC    Component Value Date/Time   WBC 7.2 02/03/2020 0204   RBC 4.47 02/03/2020 0204   HGB 14.6 02/03/2020 0248   HCT 43.0 02/03/2020 0248   PLT 159 02/03/2020 0204   MCV 91.1 02/03/2020 0204   MCV 85.3 11/24/2014 1419   MCH 29.5 02/03/2020 0204   MCHC 32.4 02/03/2020 0204   RDW 18.1 (H) 02/03/2020 0204   LYMPHSABS 1.3 02/03/2020 0204   MONOABS 0.8 02/03/2020 0204   EOSABS 0.1 02/03/2020 0204   BASOSABS 0.0 02/03/2020 0204    CMP     Component Value Date/Time   NA 133 (L) 02/03/2020 1100   NA 140 03/29/2017 1352   K 4.8 02/03/2020 1100   CL 93 (L) 02/03/2020 1100   CO2 26 02/03/2020 1100   GLUCOSE 93 02/03/2020 1100   BUN 40 (H) 02/03/2020 1100   BUN 35 (H) 03/29/2017 1352   CREATININE 6.52 (H) 02/03/2020 1100   CREATININE 2.47 (H) 04/19/2016 1144   CALCIUM 9.0 02/03/2020 1100   PROT 6.4 (L) 12/04/2019 1050   ALBUMIN 3.5 02/03/2020 1100   AST 44 (H) 12/04/2019 1050   ALT 25 12/04/2019 1050   ALKPHOS 143 (H) 12/04/2019 1050   BILITOT 1.3 (H) 12/04/2019 1050   GFRNONAA 8 (L) 02/03/2020 1100   GFRAA 10 (L) 02/03/2020 1100    Lipid Panel     Component Value Date/Time   CHOL 128 06/25/2018 0326   CHOL 161 11/12/2016 1341   TRIG 101 06/25/2018 0326   HDL 35 (L) 06/25/2018 0326   HDL 31 (L) 11/12/2016 1341   CHOLHDL 3.7 06/25/2018 0326   VLDL 20 06/25/2018 0326   LDLCALC 73 06/25/2018 0326   LDLCALC 86 11/12/2016 1341    LDLDIRECT 72.0 11/03/2014 0923     Imaging I have reviewed the images obtained:  Patient has had MRI of cervical spine, thoracic spine and lumbar spine which showed no cervical, thoracic or lumbar spinal stenosis.  No spinal cord or cauda equina abnormality identified.  Etta Quill PA-C [facility] [phone]  M-F  (9:00 am- 5:00 PM)  02/03/2020, 11:54 AM     Assessment:  This is a 59 year old male who has chronic kidney disease who is on dialysis.  Since last Saturday he has noticed that he has been having intermittent twitching of lower extremity and upper extremity.  At times to the point where he cannot walk secondary to both weakness in his legs and twitching.  On examination however patient has no weakness, does have asterixis.  Patient does state that his Neurontin has been increased on Friday and his symptoms started on Saturday. Patient has had MRI of cervical, thoracic, lumbar spine with no abnormalities noted.  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   I have reviewed the contents of history and physical exam as documented by PA/ARNP/Resident and agree with above documentation.  I have discussed and formulated the above  plan as documented. Edits to the note have been made as needed.  59 y.o. male   sleep apnea on CPAP, myocardial infarct, ischemic cardiomyopathy, hypertension, GERD, dyslipidemia, diabetes, chronic kidney disease with hemodialysis, CHF. Presented to [facility] long emergency department due to whole body muscle twitching, increased weakness and falls.  While in [place] long ED MRI of the entire spinal column was obtained and negative.  Neurology consulted for further recommendations.  Noted to have asterixis on exam,  apparently had myoclonus for the PA-this appears to have improved.  Neurontin had increased recently been increased-suspect patient presented into gabapentin toxicity.  No muscle weakness on examination, he said that his legs would give out-this is likely due to asterixis/myoclonus.  Electrolytes mostly within normal limits.  Magnesium was 2.  Slightly low at 133.  Potassium 5.1  Discontinue Neurontin and restart at previous dose after a week.  No further neurological work-up needed.    Karena Addison Devarius Nelles MD [facility] [phone]   If 7pm to 7am, please call on call as listed on AMION.

## 2020-03-22 ENCOUNTER — Emergency Department (HOSPITAL_COMMUNITY): Payer: No Typology Code available for payment source

## 2020-03-22 ENCOUNTER — Observation Stay (HOSPITAL_COMMUNITY)
Admission: EM | Admit: 2020-03-22 | Discharge: 2020-03-23 | Disposition: A | Discharge status: Home / Self Care | Payer: No Typology Code available for payment source | Attending: Internal Medicine | Admitting: Internal Medicine

## 2020-03-22 ENCOUNTER — Other Ambulatory Visit: Payer: Self-pay

## 2020-03-22 DIAGNOSIS — Z794 Long term (current) use of insulin: Secondary | ICD-10-CM | POA: Diagnosis not present

## 2020-03-22 DIAGNOSIS — E1140 Type 2 diabetes mellitus with diabetic neuropathy, unspecified: Secondary | ICD-10-CM | POA: Insufficient documentation

## 2020-03-22 DIAGNOSIS — Z9861 Coronary angioplasty status: Secondary | ICD-10-CM

## 2020-03-22 DIAGNOSIS — D696 Thrombocytopenia, unspecified: Secondary | ICD-10-CM | POA: Diagnosis not present

## 2020-03-22 DIAGNOSIS — E785 Hyperlipidemia, unspecified: Secondary | ICD-10-CM | POA: Insufficient documentation

## 2020-03-22 DIAGNOSIS — N186 End stage renal disease: Secondary | ICD-10-CM | POA: Diagnosis not present

## 2020-03-22 DIAGNOSIS — I2510 Atherosclerotic heart disease of native coronary artery without angina pectoris: Secondary | ICD-10-CM | POA: Diagnosis not present

## 2020-03-22 DIAGNOSIS — D638 Anemia in other chronic diseases classified elsewhere: Secondary | ICD-10-CM

## 2020-03-22 DIAGNOSIS — E871 Hypo-osmolality and hyponatremia: Secondary | ICD-10-CM | POA: Diagnosis not present

## 2020-03-22 DIAGNOSIS — Z20822 Contact with and (suspected) exposure to covid-19: Secondary | ICD-10-CM | POA: Diagnosis not present

## 2020-03-22 DIAGNOSIS — Z87891 Personal history of nicotine dependence: Secondary | ICD-10-CM | POA: Insufficient documentation

## 2020-03-22 DIAGNOSIS — I132 Hypertensive heart and chronic kidney disease with heart failure and with stage 5 chronic kidney disease, or end stage renal disease: Secondary | ICD-10-CM | POA: Diagnosis not present

## 2020-03-22 DIAGNOSIS — I5042 Chronic combined systolic (congestive) and diastolic (congestive) heart failure: Secondary | ICD-10-CM

## 2020-03-22 DIAGNOSIS — E1159 Type 2 diabetes mellitus with other circulatory complications: Secondary | ICD-10-CM

## 2020-03-22 DIAGNOSIS — Z79899 Other long term (current) drug therapy: Secondary | ICD-10-CM | POA: Insufficient documentation

## 2020-03-22 DIAGNOSIS — E1121 Type 2 diabetes mellitus with diabetic nephropathy: Secondary | ICD-10-CM | POA: Diagnosis present

## 2020-03-22 DIAGNOSIS — G4733 Obstructive sleep apnea (adult) (pediatric): Secondary | ICD-10-CM

## 2020-03-22 DIAGNOSIS — R569 Unspecified convulsions: Secondary | ICD-10-CM | POA: Diagnosis not present

## 2020-03-22 DIAGNOSIS — I509 Heart failure, unspecified: Secondary | ICD-10-CM | POA: Insufficient documentation

## 2020-03-22 DIAGNOSIS — E1122 Type 2 diabetes mellitus with diabetic chronic kidney disease: Secondary | ICD-10-CM | POA: Diagnosis not present

## 2020-03-22 DIAGNOSIS — I152 Hypertension secondary to endocrine disorders: Secondary | ICD-10-CM

## 2020-03-22 DIAGNOSIS — E1169 Type 2 diabetes mellitus with other specified complication: Secondary | ICD-10-CM | POA: Diagnosis present

## 2020-03-22 LAB — CBC WITH DIFFERENTIAL/PLATELET
Abs Immature Granulocytes: 0.03 10*3/uL (ref 0.00–0.07)
Basophils Absolute: 0.1 10*3/uL (ref 0.0–0.1)
Basophils Relative: 1 %
Eosinophils Absolute: 0 10*3/uL (ref 0.0–0.5)
Eosinophils Relative: 0 %
HCT: 36.2 % — ABNORMAL LOW (ref 39.0–52.0)
Hemoglobin: 12.2 g/dL — ABNORMAL LOW (ref 13.0–17.0)
Immature Granulocytes: 0 %
Lymphocytes Relative: 10 %
Lymphs Abs: 0.8 10*3/uL (ref 0.7–4.0)
MCH: 30 pg (ref 26.0–34.0)
MCHC: 33.7 g/dL (ref 30.0–36.0)
MCV: 89.2 fL (ref 80.0–100.0)
Monocytes Absolute: 0.9 10*3/uL (ref 0.1–1.0)
Monocytes Relative: 11 %
Neutro Abs: 6.3 10*3/uL (ref 1.7–7.7)
Neutrophils Relative %: 78 %
Platelets: 124 10*3/uL — ABNORMAL LOW (ref 150–400)
RBC: 4.06 MIL/uL — ABNORMAL LOW (ref 4.22–5.81)
RDW: 17.8 % — ABNORMAL HIGH (ref 11.5–15.5)
WBC: 8.2 10*3/uL (ref 4.0–10.5)
nRBC: 0 % (ref 0.0–0.2)

## 2020-03-22 LAB — I-STAT CHEM 8, ED
BUN: 28 mg/dL — ABNORMAL HIGH (ref 6–20)
Calcium, Ion: 1.04 mmol/L — ABNORMAL LOW (ref 1.15–1.40)
Chloride: 90 mmol/L — ABNORMAL LOW (ref 98–111)
Creatinine, Ser: 4.6 mg/dL — ABNORMAL HIGH (ref 0.61–1.24)
Glucose, Bld: 143 mg/dL — ABNORMAL HIGH (ref 70–99)
HCT: 38 % — ABNORMAL LOW (ref 39.0–52.0)
Hemoglobin: 12.9 g/dL — ABNORMAL LOW (ref 13.0–17.0)
Potassium: 4.2 mmol/L (ref 3.5–5.1)
Sodium: 134 mmol/L — ABNORMAL LOW (ref 135–145)
TCO2: 32 mmol/L (ref 22–32)

## 2020-03-22 LAB — PROTIME-INR
INR: 1.2 (ref 0.8–1.2)
Prothrombin Time: 14.6 seconds (ref 11.4–15.2)

## 2020-03-22 LAB — MAGNESIUM: Magnesium: 2.3 mg/dL (ref 1.7–2.4)

## 2020-03-22 LAB — COMPREHENSIVE METABOLIC PANEL
ALT: 29 U/L (ref 0–44)
AST: 33 U/L (ref 15–41)
Albumin: 3.6 g/dL (ref 3.5–5.0)
Alkaline Phosphatase: 146 U/L — ABNORMAL HIGH (ref 38–126)
Anion gap: 12 (ref 5–15)
BUN: 26 mg/dL — ABNORMAL HIGH (ref 6–20)
CO2: 30 mmol/L (ref 22–32)
Calcium: 9.2 mg/dL (ref 8.9–10.3)
Chloride: 89 mmol/L — ABNORMAL LOW (ref 98–111)
Creatinine, Ser: 4.48 mg/dL — ABNORMAL HIGH (ref 0.61–1.24)
GFR calc Af Amer: 15 mL/min — ABNORMAL LOW (ref >60)
GFR calc non Af Amer: 13 mL/min — ABNORMAL LOW (ref >60)
Glucose, Bld: 147 mg/dL — ABNORMAL HIGH (ref 70–99)
Potassium: 4.3 mmol/L (ref 3.5–5.1)
Sodium: 131 mmol/L — ABNORMAL LOW (ref 135–145)
Total Bilirubin: 1.5 mg/dL — ABNORMAL HIGH (ref 0.3–1.2)
Total Protein: 7.3 g/dL (ref 6.5–8.1)

## 2020-03-22 LAB — ETHANOL: Alcohol, Ethyl (B): <10 mg/dL (ref <10)

## 2020-03-22 LAB — CBG MONITORING, ED: Glucose-Capillary: 145 mg/dL — ABNORMAL HIGH (ref 70–99)

## 2020-03-22 LAB — PHOSPHORUS: Phosphorus: 2.4 mg/dL — ABNORMAL LOW (ref 2.5–4.6)

## 2020-03-22 LAB — SARS CORONAVIRUS 2 BY RT PCR (HOSPITAL ORDER, PERFORMED IN ~~LOC~~ HOSPITAL LAB): SARS Coronavirus 2: NEGATIVE

## 2020-03-22 MED ORDER — LORAZEPAM 2 MG/ML IJ SOLN
1.0000 mg | Freq: Once | INTRAMUSCULAR | Status: AC
  Administered 2020-03-22: 1 mg via INTRAVENOUS
  Filled 2020-03-22: qty 1

## 2020-03-22 MED ORDER — ACETAMINOPHEN 325 MG PO TABS
650.0000 mg | ORAL_TABLET | ORAL | Status: DC | PRN
  Administered 2020-03-23: 650 mg via ORAL
  Filled 2020-03-22: qty 2

## 2020-03-22 MED ORDER — INSULIN ASPART 100 UNIT/ML ~~LOC~~ SOLN: 0.0000 [IU] | Freq: Three times a day (TID) | SUBCUTANEOUS | Status: DC

## 2020-03-22 MED ORDER — CARVEDILOL 12.5 MG PO TABS
25.0000 mg | ORAL_TABLET | ORAL | Status: DC
  Administered 2020-03-23: 25 mg via ORAL
  Filled 2020-03-22: qty 2

## 2020-03-22 MED ORDER — ACETAMINOPHEN 650 MG RE SUPP: 650.0000 mg | RECTAL | Status: DC | PRN

## 2020-03-22 MED ORDER — ISOSORBIDE MONONITRATE ER 30 MG PO TB24
60.0000 mg | ORAL_TABLET | Freq: Two times a day (BID) | ORAL | Status: DC
  Administered 2020-03-23 (×2): 60 mg via ORAL
  Filled 2020-03-22 (×2): qty 2

## 2020-03-22 MED ORDER — LORAZEPAM 2 MG/ML IJ SOLN: 1.0000 mg | INTRAMUSCULAR | Status: DC | PRN

## 2020-03-22 MED ORDER — CARVEDILOL 12.5 MG PO TABS
25.0000 mg | ORAL_TABLET | Freq: Every day | ORAL | Status: DC
  Administered 2020-03-23: 25 mg via ORAL
  Filled 2020-03-22: qty 8

## 2020-03-22 MED ORDER — RANOLAZINE ER 500 MG PO TB12
1000.0000 mg | ORAL_TABLET | Freq: Two times a day (BID) | ORAL | Status: DC
  Administered 2020-03-23: 1000 mg via ORAL
  Filled 2020-03-22 (×3): qty 2

## 2020-03-22 MED ORDER — CLOPIDOGREL BISULFATE 75 MG PO TABS
75.0000 mg | ORAL_TABLET | Freq: Every day | ORAL | Status: DC
  Administered 2020-03-23: 75 mg via ORAL
  Filled 2020-03-22: qty 1

## 2020-03-22 MED ORDER — ATORVASTATIN CALCIUM 80 MG PO TABS
80.0000 mg | ORAL_TABLET | Freq: Every day | ORAL | Status: DC
  Administered 2020-03-23: 80 mg via ORAL
  Filled 2020-03-22: qty 1

## 2020-03-22 MED ORDER — HEPARIN SODIUM (PORCINE) 5000 UNIT/ML IJ SOLN
5000.0000 [IU] | Freq: Three times a day (TID) | INTRAMUSCULAR | Status: DC
  Filled 2020-03-22: qty 1

## 2020-03-22 MED ORDER — SENNOSIDES-DOCUSATE SODIUM 8.6-50 MG PO TABS: 1.0000 | ORAL_TABLET | Freq: Every evening | ORAL | Status: DC | PRN

## 2020-03-22 NOTE — ED Provider Notes (Signed)
[facility] EMERGENCY DEPARTMENT Provider Note   CSN: [id number] Arrival date & time: 03/22/20  1644     History Chief Complaint  Patient presents with  . Seizures    Matthew Rowe. is a 59 y.o. male.  HPI     Past Medical History:  Diagnosis Date  . Acute on chronic combined systolic and diastolic CHF (congestive heart failure) ([place]) 10/15/2017  . Chronic anemia   . Chronic combined systolic and diastolic CHF (congestive heart failure) ([place])   . Chronic stable angina ([place])   . CKD (chronic kidney disease), stage IV ([place])   . Coronary artery disease    a. INF MI 1999: PCI of RCA;  b.  extensive stenting of LAD;  c. s/p CABG with RIMA->RCA in 2006;  d. Stable, Low risk MV 05/2012; e. 09/2012 Cath: stable anatomy->med Rx. f. Abnl nuc 11/2013 -> med rx; g. 11/2013 Cath/PCI: s/p DES to RCA. h. Abnormal nuc 11/2016, pt declined cath.   . DM2 (diabetes mellitus, type 2) (HCC)    Type II  . Dyslipidemia   . GERD (gastroesophageal reflux disease)   . History of gout   . Hypertension   . Hypertensive heart disease   . Ischemic cardiomyopathy   . Mild aortic stenosis   . Myocardial infarction [facility]) 1999; 2002; 2003; 2006; ~ 2008  . OSA on CPAP    wears CPAP  . Polycystic kidney disease     Patient Active Problem List   Diagnosis Date Noted  . Seizure ([place]) 03/22/2020  . Anemia of chronic disease 03/22/2020  . NSTEMI (non-ST elevated myocardial infarction) ([place]) 07/10/2019  . ESRD (end stage renal disease) ([place]) 07/10/2019  . Coronary artery disease involving native heart with unstable angina pectoris ([place])   . Acute respiratory failure ([place])   . Acute diastolic (congestive) heart failure ([place])   . Unstable angina ([place]) 06/18/2018  . Leukocytosis 06/18/2018  . Chronic pain 06/18/2018  . Insomnia 06/18/2018  . Chest pain 06/17/2018  . Chronic heart failure with preserved ejection fraction ([place]) 06/06/2018  . Anemia due to stage 4 chronic kidney disease  ([place]) 02/12/2018  . Coronary artery disease of native heart with stable angina pectoris ([place]) 10/25/2017  . Chronic combined systolic and diastolic CHF (congestive heart failure) ([place]) 10/25/2017  . Chronic anemia 10/15/2017  . Elevated troponin 10/15/2017  . CKD (chronic kidney disease) stage 4, GFR 15-29 ml/min (HCC) 07/22/2017  . Hyperkalemia 04/01/2017  . Non-ST elevation (NSTEMI) myocardial infarction ([place]) 04/01/2017  . Chest pain with moderate risk of acute coronary syndrome 04/01/2017  . Hx of CABG   . Ischemic cardiomyopathy 02/21/2017  . HLD (hyperlipidemia)   . GERD (gastroesophageal reflux disease) 05/08/2015  . Gout 05/08/2015  . MI (myocardial infarction) ([place]) 04/19/2015  . CAD- S/P multiple PCIs, CABG   . Hypertensive heart disease   . Precordial chest pain 11/27/2013  . Financial difficulty 03/13/2013  . Medically noncompliant 03/13/2013  . Type 2 diabetes with nephropathy ([place])   . Obesity-BMI 42 11/27/2012  . Polycystic kidney disease 09/15/2012  . OSA on CPAP 09/15/2012  . Hypertension associated with diabetes ([place]) 03/27/2011  . Hyperlipidemia associated with type 2 diabetes mellitus [facility])     Past Surgical History:  Procedure Laterality Date  . AV FISTULA PLACEMENT Left 05/29/2018   Procedure: ARTERIOVENOUS (AV) FISTULA CREATION  LEFT ARM.;  Surgeon: Serafina Mitchell, MD;  Location: [facility];  Service: Vascular;  Laterality: Left;  . BASCILIC  VEIN TRANSPOSITION Left 07/24/2018   Procedure: SECOND STAGE BASCILIC VEIN TRANSPOSITION LEFT ARM;  Surgeon: Serafina Mitchell, MD;  Location: [facility];  Service: Vascular;  Laterality: Left;  . CARDIAC CATHETERIZATION  2012  . CARDIAC CATHETERIZATION  2013  . CARDIAC CATHETERIZATION  2014   LHC (1/14): total occlusion of distal LAD, diffuse disease in ramus, 70% proximal LCx, 50% proximal and mid RCA, 50% distal RCA, patent RIMA-RCA.  Marland Kitchen CORONARY ANGIOPLASTY WITH STENT PLACEMENT  1999 / 2002 / 2004 / 2006?  . CORONARY ARTERY  BYPASS GRAFT  2006   CABG X?; in [place]  . CYSTECTOMY  1990's   off face  . HERNIA REPAIR     inguinal hernia  . LEFT HEART CATH AND CORONARY ANGIOGRAPHY N/A 07/13/2019   Procedure: LEFT HEART CATH AND CORONARY ANGIOGRAPHY;  Surgeon: Nelva Bush, MD;  Location: [facility] CV LAB;  Service: Cardiovascular;  Laterality: N/A;  . LEFT HEART CATHETERIZATION WITH CORONARY/GRAFT ANGIOGRAM N/A 09/16/2012   Procedure: LEFT HEART CATHETERIZATION WITH [name];  Surgeon: [name], MD;  Location: [facility] CATH LAB;  Service: Cardiovascular;  Laterality: N/A;  . LEFT HEART CATHETERIZATION WITH CORONARY/GRAFT ANGIOGRAM N/A 12/02/2013   Procedure: LEFT HEART CATHETERIZATION WITH [name];  Surgeon: [name], MD;  Location: [facility] CATH LAB;  Service: Cardiovascular;  Laterality: N/A;  . PERCUTANEOUS CORONARY STENT INTERVENTION (PCI-S) N/A 12/03/2013   Procedure: PERCUTANEOUS CORONARY STENT INTERVENTION (PCI-S);  Surgeon: [name], MD;  Location: [facility] CATH LAB;  Service: Cardiovascular;  Laterality: N/A;  . RIGHT/LEFT HEART CATH AND CORONARY/GRAFT ANGIOGRAPHY N/A 06/23/2018   Procedure: RIGHT/LEFT HEART CATH AND CORONARY/GRAFT ANGIOGRAPHY;  Surgeon: [name], MD;  Location: [place] CV LAB;  Service: Cardiovascular;  Laterality: N/A;       Family History  Problem Relation Age of Onset  . Lung cancer Mother   . Anemia Mother   . Polycystic kidney disease Mother   . Diabetes Father   . Heart attack Father 66       Died suddenly  . Heart failure Father   . Hyperlipidemia Father   . Hypertension Father   . Kidney failure Sister 5       Died  . Heart attack Sister   . Hypertension Sister   . Diabetes Sister   . Diabetes Brother   . Polycystic kidney disease Brother   . Diabetes Sister     Social History   Tobacco Use  . Smoking status: Former Smoker    Types: Cigarettes  . Smokeless tobacco: Never Used  . Tobacco comment: smoked  some as a teenager (high school) not even a pack a week  Vaping Use  . Vaping Use: Never used  Substance Use Topics  . Alcohol use: No    Alcohol/week: 0.0 standard drinks    Comment: 09/15/2012 "drank a little when I was young"  . Drug use: No    Home Medications Prior to Admission medications   Medication Sig Start Date End Date Taking? Authorizing Provider  acetaminophen (TYLENOL) 500 MG tablet Take 500 mg by mouth 2 (two) times daily as needed (for pain or headaches).    Yes [provider]  allopurinol (ZYLOPRIM) 100 MG tablet Take 50 mg by mouth 2 (two) times daily.    Yes [provider]  atorvastatin (LIPITOR) 80 MG tablet Take 1 tablet (80 mg total) by mouth daily. 08/26/17  Yes Dunn, Nedra Hai, PA-C  Calcium Acetate 667 MG TABS Take [phone]  mg by mouth See admin instructions. Takes 1 tablet in the morning, and take 2 tablets in the evening. 10/13/19  Yes [provider]  carvedilol (COREG) 25 MG tablet Take 1 tablet (25 mg total) by mouth 2 (two) times daily with a meal. Skip AM dose on dialysis days Patient taking differently: Take 25 mg by mouth 2 (two) times daily with a meal.  12/01/19  Yes [name], MD  Cholecalciferol 2000 units TABS Take 2,000 Units by mouth daily.    Yes [provider]  clopidogrel (PLAVIX) 75 MG tablet TAKE ONE TABLET BY MOUTH ONCE DAILY WITH BREAKFAST Patient taking differently: Take 75 mg by mouth daily.  12/25/16  Yes End, Harrell Gave, MD  diphenhydrAMINE (BENADRYL) 25 MG tablet Take 25 mg by mouth See admin instructions. Takes on dialysis days for itching (M,W,F).   Yes [provider]  insulin aspart protamine- aspart (NOVOLOG MIX 70/30) (70-30) 100 UNIT/ML injection Inject 20 Units into the skin as needed (if blood sugar is over 180).    Yes [provider]  isosorbide mononitrate (IMDUR) 60 MG 24 hr tablet Take 1 tablet (60 mg total) by mouth 2 (two) times daily. 07/16/19 03/22/29 Yes  Ghimire, Dante Gang, MD  lidocaine-prilocaine (EMLA) cream Apply 1 application topically 3 (three) times a week. 12/01/19  Yes [provider]  Multiple Vitamin (MULTIVITAMIN WITH MINERALS) TABS tablet Take 1 tablet by mouth daily.   Yes [provider]  nitroGLYCERIN (NITROSTAT) 0.4 MG SL tablet Place 1 tablet (0.4 mg total) under the tongue every 5 (five) minutes as needed for chest pain. 06/06/18  Yes End, Harrell Gave, MD  pantoprazole (PROTONIX) 40 MG tablet Take 1 tablet (40 mg total) by mouth daily. 12/25/19  Yes [name], MD  ranolazine (RANEXA) 500 MG 12 hr tablet Take 1 tablet (500 mg total) by mouth 2 (two) times daily. Patient taking differently: Take 1,000 mg by mouth 2 (two) times daily.  03/29/17  Yes End, Harrell Gave, MD  Semaglutide (OZEMPIC, 0.25 OR 0.5 MG/DOSE, [place]) Inject 0.25 mg into the skin every Saturday.   Yes [provider]  vitamin B-12 (CYANOCOBALAMIN) 1000 MCG tablet Take 1,000 mcg by mouth See admin instructions. On M,W,F - Dialysis days.   Yes [provider]  zolpidem (AMBIEN) 10 MG tablet Take 10 mg by mouth at bedtime as needed for sleep.  09/30/17  Yes [provider]  gabapentin (NEURONTIN) 300 MG capsule Take 1 capsule (300 mg total) by mouth daily. Patient not taking: Reported on 03/22/2020 07/16/19 03/22/29  Barb Merino, MD  glucose blood (ACCU-CHEK AVIVA PLUS) test strip 1 each by Other route in the morning, at noon, and at bedtime. 09/29/19   [provider]  Misc. Devices MISC by Does not apply route. C-PAP    [provider]    Allergies    Cleocin [clindamycin hcl], Glimepiride [amaryl], Clarithromycin, Colchicine, and Ciprocin-fluocin-procin [fluocinolone acetonide]  Review of Systems   Review of Systems  Physical Exam Updated Vital Signs BP 128/78   Pulse 65   Temp 98.5 F (36.9 C) (Oral)   Resp (!) 25   Ht 5' 5.5" (1.664 m)   Wt 97 kg   SpO2 99%   BMI 35.04 kg/m   Physical  Exam  ED Results / Procedures / Treatments   Labs (all labs ordered are listed, but only abnormal results are displayed) Labs Reviewed  CBC WITH DIFFERENTIAL/PLATELET - Abnormal; Notable for the following components:  Result Value   RBC 4.06 (*)    Hemoglobin 12.2 (*)    HCT 36.2 (*)    RDW 17.8 (*)    Platelets 124 (*)    All other components within normal limits  PHOSPHORUS - Abnormal; Notable for the following components:   Phosphorus 2.4 (*)    All other components within normal limits  COMPREHENSIVE METABOLIC PANEL - Abnormal; Notable for the following components:   Sodium 131 (*)    Chloride 89 (*)    Glucose, Bld 147 (*)    BUN 26 (*)    Creatinine, Ser 4.48 (*)    Alkaline Phosphatase 146 (*)    Total Bilirubin 1.5 (*)    GFR calc non Af Amer 13 (*)    GFR calc Af Amer 15 (*)    All other components within normal limits  CBG MONITORING, ED - Abnormal; Notable for the following components:   Glucose-Capillary 145 (*)    All other components within normal limits  I-STAT CHEM 8, ED - Abnormal; Notable for the following components:   Sodium 134 (*)    Chloride 90 (*)    BUN 28 (*)    Creatinine, Ser 4.60 (*)    Glucose, Bld 143 (*)    Calcium, Ion 1.04 (*)    Hemoglobin 12.9 (*)    HCT 38.0 (*)    All other components within normal limits  SARS CORONAVIRUS 2 BY RT PCR (HOSPITAL ORDER, [facility])  PROTIME-INR  ETHANOL  MAGNESIUM  HEMOGLOBIN A1C  COMPREHENSIVE METABOLIC PANEL  MAGNESIUM  PHOSPHORUS  CBC WITH DIFFERENTIAL/PLATELET  COMPREHENSIVE METABOLIC PANEL  MAGNESIUM  PHOSPHORUS  CBG MONITORING, ED    EKG EKG Interpretation  Date/Time:  Tuesday March 22 2020 23:47:19 EDT Ventricular Rate:  60 PR Interval:    QRS Duration: 131 QT Interval:  465 QTC Calculation: 465 R Axis:   -71 Text Interpretation: Sinus rhythm Probable left atrial enlargement Nonspecific IVCD with LAD Probable inferior infarct, old Anterior  infarct, old When compared with ECG of 02/01/2020, No significant change was found Confirmed by Delora Fuel ([id number]) on 03/22/2020 11:53:08 PM   Radiology CT HEAD WO CONTRAST  Result Date: 03/22/2020 CLINICAL DATA:  Seizure, abnormal neurologic exam EXAM: CT HEAD WITHOUT CONTRAST TECHNIQUE: Contiguous axial images were obtained from the base of the skull through the vertex without intravenous contrast. COMPARISON:  None. FINDINGS: Brain: Hypodensities in the bilateral frontal periventricular white matter are most consistent with chronic small vessel ischemic change. No signs of acute infarct or hemorrhage. Lateral ventricles and midline structures are otherwise unremarkable. No acute extra-axial fluid collections. No mass effect. Vascular: No hyperdense vessel or unexpected calcification. Skull: Normal. Negative for fracture or focal lesion. Sinuses/Orbits: No acute finding. Other: None. IMPRESSION: 1. Chronic small vessel ischemic changes in the white matter. No acute intracranial process. Electronically Signed   By: Randa Ngo M.D.   On: 03/22/2020 19:47   MR BRAIN WO CONTRAST  Result Date: 03/23/2020 CLINICAL DATA:  Nontraumatic seizure after dialysis today. EXAM: MRI HEAD WITHOUT CONTRAST TECHNIQUE: Multiplanar, multiecho pulse sequences of the brain and surrounding structures were obtained without intravenous contrast. COMPARISON:  Head CT from yesterday FINDINGS: Brain: No acute infarction, hemorrhage, hydrocephalus, extra-axial collection or mass lesion. FLAIR hyperintensity in the cerebral white matter with remote infarct and wallerian degeneration crossing the body of the corpus callosum. Remote small vessel infarct in the ventral brainstem. Vascular: Normal flow voids. Skull and upper cervical spine:  Low marrow signal in the upper cervical spine, likely from patient's dialysis history. Sinuses/Orbits: Proteinaceous retention cyst appearance along the floor of 1 of the sphenoid sinuses.  IMPRESSION: 1. Motion degraded study without acute intracranial finding. No focal cortical abnormality to explain seizure. 2. Left scalp contusion. 3. Chronic white matter disease. Electronically Signed   By: Monte Fantasia M.D.   On: 03/23/2020 04:55    Procedures Procedures (including critical care time)  Medications Ordered in ED Medications  heparin injection 5,000 Units (5,000 Units Subcutaneous Not Given 03/23/20 0532)  LORazepam (ATIVAN) injection 1-2 mg (has no administration in time range)  acetaminophen (TYLENOL) tablet 650 mg (has no administration in time range)    Or  acetaminophen (TYLENOL) suppository 650 mg (has no administration in time range)  senna-docusate (Senokot-S) tablet 1 tablet (has no administration in time range)  atorvastatin (LIPITOR) tablet 80 mg (has no administration in time range)  carvedilol (COREG) tablet 25 mg (25 mg Oral Given 03/23/20 0015)  clopidogrel (PLAVIX) tablet 75 mg (has no administration in time range)  isosorbide mononitrate (IMDUR) 24 hr tablet 60 mg (60 mg Oral Given 03/23/20 0015)  ranolazine (RANEXA) 12 hr tablet 1,000 mg (1,000 mg Oral Not Given 03/23/20 0315)  insulin aspart (novoLOG) injection 0-9 Units (0 Units Subcutaneous Not Given 03/23/20 0803)  carvedilol (COREG) tablet 25 mg (has no administration in time range)  LORazepam (ATIVAN) injection 1 mg (1 mg Intravenous Given 03/22/20 1819)    ED Course  I have reviewed the triage vital signs and the nursing notes.  Pertinent labs & imaging results that were available during my care of the patient were reviewed by me and considered in my medical decision making (see chart for details).    MDM Rules/Calculators/A&P                          [id number] VS: BP 128/78   Pulse 65   Temp 98.5 F (36.9 C) (Oral)   Resp (!) 25   Ht 5' 5.5" (1.664 m)   Wt 97 kg   SpO2 99%   BMI 35.04 kg/m   HY:IFOYDXA is gathered by patient  and EMS, WIfe at bedside. Previous records obtained and  reviewed. DDX:The patient's complaint of seizure involves an extensive number of diagnostic and treatment options, and is a complaint that carries with it a high risk of complications, morbidity, and potential mortality. Given the large differential diagnosis, medical decision making is of high complexity. The differential diagnosis for includes but is not limited to idiopathic seizure, traumatic brain injury, intracranial hemorrhage, vascular lesion, mass or space containing lesion, degenerative neurologic disease, congenital brain abnormality, infectious etiology such as meningitis, encephalitis or abscess, metabolic disturbance including hyper or hypoglycemia, hyper or hyponatremia, hyperosmolar state, uremia, hepatic failure, hypocalcemia, hypomagnesemia.  Toxic substances such as cocaine, lidocaine, antidepressants, theophylline, alcohol withdrawal, drug withdrawal, eclampsia, hypertensive encephalopathy and anoxic brain injury.  Labs: I ordered reviewed and interpreted labs which include CBC- which shows no acute abnormalities, patient's CMP shows mild hyponatremia, baseline end-stage renal function, mildly elevated glucose, slightly elevated alk phos and total bili.  Patient's phosphorus, magnesium, calcium and potassium just below normal.   Imaging: I ordered and reviewed images which included CT head. I independently visualized and interpreted all imaging. There are no acute, significant findings on today's images. EKG  shows sinus rhythm at a rate of 62 Consults: Case discussed with Dr. Cheral Marker who recommends inpatient admission MDM: Patient  here with altered first onset seizure.  He apparently had elevated electrolyte values prior to going to dialysis which were reduced during dialysis.  His seizure may have been caused from rapid reduction in electrolytes, he also was on tramadol which could have lowered his seizure threshold.  Patient was admitted about a month ago and had his Neurontin  abruptly discontinued which may have contributed to lowering his seizure threshold as well. Patieent counseled on driving restrictions. He will be admitted to the hospitalist service. Patient disposition:admit The patient appears reasonably stabilized for admission considering the current resources, flow, and capabilities available in the ED at this time, and I doubt any other [facility] requiring further screening and/or treatment in the ED prior to admission.        Final Clinical Impression(s) / ED Diagnoses Final diagnoses:  Seizure [facility])    Rx / [place] Orders ED Discharge Orders    None       Margarita Mail, PA-C 03/23/20 1112    Daleen Bo, MD 03/23/20 2203

## 2020-03-22 NOTE — ED Triage Notes (Signed)
Pt BIB [facility] from home. Pt went to dialysis this morning when he got home he was feeling tired. Pt experienced a seizure lasting approx 2 min. Pt did bite his tongue and presents with minimal bleeding. Pt confused upon arrival to department. VSS.

## 2020-03-22 NOTE — H&P (Signed)
History and Physical    Matthew Rowe TFT:[id number] DOB: 03-Jan-1961 DOA: 03/22/2020  PCP: [name], MD  Patient coming from: Home via EMS  I have personally briefly reviewed patient's old medical records in [place]  Chief Complaint: Seizure-like activity  HPI: Matthew Rowe. is a 59 y.o. male with medical history significant for polycystic kidney disease with ESRD on TTS HD, chronic combined systolic and diastolic CHF (EF 70-62% by TTE 07/12/2019), ischemic cardiomyopathy, CAD s/p CABG and DES, IDT2DM, HTN, HLD, anemia of chronic disease, and OSA on CPAP who presents to the ED for evaluation after new onset seizure like activity.  Patient states over the last few days he was told that he had abnormal sodium and calcium levels.  He has been taking calcium supplements and reportedly was also taking sodium tablets.  He had been having some cramping sensation in both of his legs.  He went to his usual dialysis session earlier today on 03/22/2020 and prior to dialysis was told he had high sodium and calcium levels.  He did complete dialysis without complication.  When he returned home he lied down and shortly afterwards his wife walked into the room and noticed he was having significant shaking of upper extremities and appeared to have seizure-like activity.  He was unconscious during this time period which lasted about 2 minutes.  His wife called EMS and after their arrival he regained consciousness.  He felt confused.  He did not have any loss of bowel/bladder control.  He denies any recent chest pain, palpitations, dyspnea, abdominal pain, nausea, vomiting, subjective fevers, chills, or diaphoresis.  He says he still makes urine without dysuria.  He denies any recent diarrhea.  He has not had any swelling in his lower extremities.  Of note, patient was recently seen in the ED on 02/03/2020 with twitching/myoclonic symptoms and progressive back and leg pain with weakness x4  days.  He underwent MRI evaluation of the whole spine without evidence of spinal cord or cauda equina abnormality.  Pontine lacunar infarcts were seen as well as severe polycystic renal disease with evidence of progression.  Mild to moderate degenerative neural foraminal stenosis in the cervical and lower lumbar spines were noted.  At that time he was noted to have had a recent increase in his home gabapentin from once a day to 3 times daily.  This was felt related to his symptoms and neurology recommended discontinuing it and then restarting at prior dose 1 week later.  It appears he has been off gabapentin since then.  He has been on tramadol.  ED Course:  Initial vitals showed BP 121/66, pulse 66, RR 17, temp 98.4 Fahrenheit, SPO2 100% on room air.  Labs are notable for WBC 8.2, hemoglobin 12.2, platelets 124,000, sodium 131, potassium 4.3, bicarb 30, BUN 26, creatinine 4.48, AST 33, ALT 29, alk phos 146, total bilirubin 1.5, serum glucose 147, phosphorus 2.4, magnesium 2.3, serum ethanol undetectable.  SARS-CoV-2 PCR is collected and pending.  CT head without contrast shows chronic small vessel ischemic changes in the white matter with no acute intracranial process seen.  Patient was given 1 mg IV Ativan.  Neurology were consulted and recommended medical admission for further evaluation.  The hospitalist service was consulted to admit for further evaluation management.  Review of Systems: All systems reviewed and are negative except as documented in history of present illness above.   Past Medical History:  Diagnosis Date  . Acute on chronic  combined systolic and diastolic CHF (congestive heart failure) ([place]) 10/15/2017  . Chronic anemia   . Chronic combined systolic and diastolic CHF (congestive heart failure) ([place])   . Chronic stable angina ([place])   . CKD (chronic kidney disease), stage IV ([place])   . Coronary artery disease    a. INF MI 1999: PCI of RCA;  b.  extensive stenting of LAD;  c.  s/p CABG with RIMA->RCA in 2006;  d. Stable, Low risk MV 05/2012; e. 09/2012 Cath: stable anatomy->med Rx. f. Abnl nuc 11/2013 -> med rx; g. 11/2013 Cath/PCI: s/p DES to RCA. h. Abnormal nuc 11/2016, pt declined cath.   . DM2 (diabetes mellitus, type 2) (HCC)    Type II  . Dyslipidemia   . GERD (gastroesophageal reflux disease)   . History of gout   . Hypertension   . Hypertensive heart disease   . Ischemic cardiomyopathy   . Mild aortic stenosis   . Myocardial infarction [facility]) 1999; 2002; 2003; 2006; ~ 2008  . OSA on CPAP    wears CPAP  . Polycystic kidney disease     Past Surgical History:  Procedure Laterality Date  . AV FISTULA PLACEMENT Left 05/29/2018   Procedure: ARTERIOVENOUS (AV) FISTULA CREATION  LEFT ARM.;  Surgeon: Serafina Mitchell, MD;  Location: [facility];  Service: Vascular;  Laterality: Left;  . BASCILIC VEIN TRANSPOSITION Left 07/24/2018   Procedure: SECOND STAGE BASCILIC VEIN TRANSPOSITION LEFT ARM;  Surgeon: Serafina Mitchell, MD;  Location: [facility];  Service: Vascular;  Laterality: Left;  . CARDIAC CATHETERIZATION  2012  . CARDIAC CATHETERIZATION  2013  . CARDIAC CATHETERIZATION  2014   LHC (1/14): total occlusion of distal LAD, diffuse disease in ramus, 70% proximal LCx, 50% proximal and mid RCA, 50% distal RCA, patent RIMA-RCA.  Marland Kitchen CORONARY ANGIOPLASTY WITH STENT PLACEMENT  1999 / 2002 / 2004 / 2006?  . CORONARY ARTERY BYPASS GRAFT  2006   CABG X?; in [place]  . CYSTECTOMY  1990's   off face  . HERNIA REPAIR     inguinal hernia  . LEFT HEART CATH AND CORONARY ANGIOGRAPHY N/A 07/13/2019   Procedure: LEFT HEART CATH AND CORONARY ANGIOGRAPHY;  Surgeon: Nelva Bush, MD;  Location: [facility] CV LAB;  Service: Cardiovascular;  Laterality: N/A;  . LEFT HEART CATHETERIZATION WITH CORONARY/GRAFT ANGIOGRAM N/A 09/16/2012   Procedure: LEFT HEART CATHETERIZATION WITH [name];  Surgeon: [name], MD;  Location: [facility] CATH LAB;  Service: Cardiovascular;   Laterality: N/A;  . LEFT HEART CATHETERIZATION WITH CORONARY/GRAFT ANGIOGRAM N/A 12/02/2013   Procedure: LEFT HEART CATHETERIZATION WITH [name];  Surgeon: [name], MD;  Location: [facility] [facility] CATH LAB;  Service: Cardiovascular;  Laterality: N/A;  . PERCUTANEOUS CORONARY STENT INTERVENTION (PCI-S) N/A 12/03/2013   Procedure: PERCUTANEOUS CORONARY STENT INTERVENTION (PCI-S);  Surgeon: [name], MD;  Location: [facility] CATH LAB;  Service: Cardiovascular;  Laterality: N/A;  . RIGHT/LEFT HEART CATH AND CORONARY/GRAFT ANGIOGRAPHY N/A 06/23/2018   Procedure: RIGHT/LEFT HEART CATH AND CORONARY/GRAFT ANGIOGRAPHY;  Surgeon: [name], MD;  Location: [place] CV LAB;  Service: Cardiovascular;  Laterality: N/A;    Social History:  reports that he has quit smoking. His smoking use included cigarettes. He has never used smokeless tobacco. He reports that he does not drink alcohol and does not use drugs.  Allergies  Allergen Reactions  . Cleocin [Clindamycin Hcl] Anaphylaxis and Swelling  . Glimepiride [Amaryl] Other (See Comments)    Elevates liver function   .  Clarithromycin Itching and Other (See Comments)    "Biaxin" Eyes itch and burn  . Colchicine Other (See Comments)    Affected kidneys   . Ciprocin-Fluocin-Procin [Fluocinolone Acetonide] Rash    Family History  Problem Relation Age of Onset  . Lung cancer Mother   . Anemia Mother   . Polycystic kidney disease Mother   . Diabetes Father   . Heart attack Father 59       Died suddenly  . Heart failure Father   . Hyperlipidemia Father   . Hypertension Father   . Kidney failure Sister 5       Died  . Heart attack Sister   . Hypertension Sister   . Diabetes Sister   . Diabetes Brother   . Polycystic kidney disease Brother   . Diabetes Sister      Prior to Admission medications   Medication Sig Start Date End Date Taking? Authorizing Provider  acetaminophen (TYLENOL) 500 MG tablet Take 500 mg by  mouth 2 (two) times daily as needed (for pain or headaches).     [provider]  allopurinol (ZYLOPRIM) 100 MG tablet Take 50 mg by mouth 2 (two) times daily.     [provider]  atorvastatin (LIPITOR) 80 MG tablet Take 1 tablet (80 mg total) by mouth daily. 08/26/17   Charlie Pitter, PA-C  Calcium Acetate 667 MG TABS Take 667 mg by mouth See admin instructions. Takes 1 tablet in the morning, and 2 tablets in the evening. 10/13/19   [provider]  carvedilol (COREG) 25 MG tablet Take 1 tablet (25 mg total) by mouth 2 (two) times daily with a meal. Skip AM dose on dialysis days Patient taking differently: Take 25 mg by mouth 2 (two) times daily with a meal.  12/01/19   [name], MD  Cholecalciferol 2000 units TABS Take 2,000 Units by mouth daily.     [provider]  clopidogrel (PLAVIX) 75 MG tablet TAKE ONE TABLET BY MOUTH ONCE DAILY WITH BREAKFAST Patient taking differently: Take 75 mg by mouth daily.  12/25/16   End, Harrell Gave, MD  diphenhydrAMINE (BENADRYL) 25 MG tablet Take 25 mg by mouth See admin instructions. Takes on dialysis days for itching (M,W,F).    [provider]  furosemide (LASIX) 80 MG tablet Take 80 mg by mouth as needed for fluid.  12/19/19   [provider]  gabapentin (NEURONTIN) 300 MG capsule Take 1 capsule (300 mg total) by mouth daily. Patient taking differently: Take 300 mg by mouth 3 (three) times daily.  07/16/19 02/03/20  Barb Merino, MD  glucose blood (ACCU-CHEK AVIVA PLUS) test strip 1 each by Other route in the morning, at noon, and at bedtime. 09/29/19   [provider]  insulin aspart protamine- aspart (NOVOLOG MIX 70/30) (70-30) 100 UNIT/ML injection Inject 20 Units into the skin as needed (if blood sugar is over 180).     [provider]  isosorbide mononitrate (IMDUR) 60 MG 24 hr tablet Take 1 tablet (60 mg total) by mouth 2 (two) times daily. 07/16/19 02/03/20  Barb Merino, MD    lidocaine-prilocaine (EMLA) cream Apply 1 application topically 3 (three) times a week. 12/01/19   [provider]  Misc. Devices MISC by Does not apply route. C-PAP    [provider]  Multiple Vitamin (MULTIVITAMIN WITH MINERALS) TABS tablet Take 1 tablet by mouth daily.    [provider]  multivitamin (RENA-VIT) TABS tablet Take 1 tablet  by mouth daily. 01/28/20   [provider]  nitroGLYCERIN (NITROSTAT) 0.4 MG SL tablet Place 1 tablet (0.4 mg total) under the tongue every 5 (five) minutes as needed for chest pain. 06/06/18   End, Harrell Gave, MD  pantoprazole (PROTONIX) 40 MG tablet Take 1 tablet (40 mg total) by mouth daily. 12/25/19   [name], MD  ranolazine (RANEXA) 500 MG 12 hr tablet Take 1 tablet (500 mg total) by mouth 2 (two) times daily. Patient taking differently: Take 1,000 mg by mouth 2 (two) times daily.  03/29/17   End, Harrell Gave, MD  Semaglutide (OZEMPIC, 0.25 OR 0.5 MG/DOSE, [place]) Inject 0.25 mg into the skin every Saturday.    [provider]  traMADol (ULTRAM) 50 MG tablet Take 1 tablet (50 mg total) by mouth every 12 (twelve) hours as needed for moderate pain. 06/26/18   Patrecia Pour, MD  vitamin B-12 (CYANOCOBALAMIN) 1000 MCG tablet Take 1,000 mcg by mouth See admin instructions. On M,W,F - Dialysis days.    [provider]  zolpidem (AMBIEN) 10 MG tablet Take 10 mg by mouth at bedtime as needed for sleep.  09/30/17   [provider]    Physical Exam: Vitals:   03/22/20 1659 03/22/20 1816 03/22/20 1821 03/22/20 2202  BP:  121/72  128/70  Pulse:   63 (!) 52  Resp:   (!) 33 (!) 22  Temp:      TempSrc:      SpO2:   98% 90%  Weight: 97 kg     Height: 5' 5.5" (1.664 m)      Constitutional: Sitting up in bed, NAD, calm, comfortable Eyes: PERRL, lids and conjunctivae normal ENMT: Mucous membranes are moist. Posterior pharynx clear of any exudate or lesions.Normal dentition.  Neck: normal, supple,  no masses. Respiratory: clear to auscultation bilaterally, no wheezing, no crackles. Normal respiratory effort. No accessory muscle use.  Cardiovascular: Regular rate and rhythm, no murmurs / rubs / gallops. No extremity edema. 2+ pedal pulses.  LUE aVF with palpable thrill. Abdomen: no tenderness, no masses palpated. No hepatosplenomegaly. Bowel sounds positive.  Musculoskeletal: no clubbing / cyanosis. No joint deformity upper and lower extremities. Good ROM, no contractures. Normal muscle tone.  Skin: no rashes, lesions, ulcers. No induration Neurologic: CN 2-12 grossly intact. Sensation intact, Strength 5/5 in all 4.  Psychiatric: Normal judgment and insight. Alert and oriented x 3. Normal mood.     Labs on Admission: I have personally reviewed following labs and imaging studies  CBC: Recent Labs  Lab 03/22/20 1800 03/22/20 1819  WBC 8.2  --   NEUTROABS 6.3  --   HGB 12.2* 12.9*  HCT 36.2* 38.0*  MCV 89.2  --   PLT 124*  --    Basic Metabolic Panel: Recent Labs  Lab 03/22/20 1800 03/22/20 1819  NA 131* 134*  K 4.3 4.2  CL 89* 90*  CO2 30  --   GLUCOSE 147* 143*  BUN 26* 28*  CREATININE 4.48* 4.60*  CALCIUM 9.2  --   MG 2.3  --   PHOS 2.4*  --    GFR: Estimated Creatinine Clearance: 18.5 mL/min (A) (by C-G formula based on SCr of 4.6 mg/dL (H)). Liver Function Tests: Recent Labs  Lab 03/22/20 1800  AST 33  ALT 29  ALKPHOS 146*  BILITOT 1.5*  PROT 7.3  ALBUMIN 3.6   No results for input(s): LIPASE, AMYLASE in the last 168 hours. No results for input(s): AMMONIA in the  last 168 hours. Coagulation Profile: Recent Labs  Lab 03/22/20 1800  INR 1.2   Cardiac Enzymes: No results for input(s): CKTOTAL, CKMB, CKMBINDEX, TROPONINI in the last 168 hours. BNP (last 3 results) No results for input(s): PROBNP in the last 8760 hours. HbA1C: No results for input(s): HGBA1C in the last 72 hours. CBG: Recent Labs  Lab 03/22/20 1819  GLUCAP 145*   Lipid  Profile: No results for input(s): CHOL, HDL, LDLCALC, TRIG, CHOLHDL, LDLDIRECT in the last 72 hours. Thyroid Function Tests: No results for input(s): TSH, T4TOTAL, FREET4, T3FREE, THYROIDAB in the last 72 hours. Anemia Panel: No results for input(s): VITAMINB12, FOLATE, FERRITIN, TIBC, IRON, RETICCTPCT in the last 72 hours. Urine analysis:    Component Value Date/Time   COLORURINE STRAW (A) 06/20/2018 2033   APPEARANCEUR CLEAR 06/20/2018 2033   LABSPEC 1.006 06/20/2018 2033   PHURINE 6.0 06/20/2018 2033   GLUCOSEU NEGATIVE 06/20/2018 2033   HGBUR NEGATIVE 06/20/2018 2033   BILIRUBINUR NEGATIVE 06/20/2018 2033   [place] NEGATIVE 06/20/2018 2033   PROTEINUR NEGATIVE 06/20/2018 2033   UROBILINOGEN 1.0 07/17/2015 1425   NITRITE NEGATIVE 06/20/2018 2033   LEUKOCYTESUR NEGATIVE 06/20/2018 2033    Radiological Exams on Admission: CT HEAD WO CONTRAST  Result Date: 03/22/2020 CLINICAL DATA:  Seizure, abnormal neurologic exam EXAM: CT HEAD WITHOUT CONTRAST TECHNIQUE: Contiguous axial images were obtained from the base of the skull through the vertex without intravenous contrast. COMPARISON:  None. FINDINGS: Brain: Hypodensities in the bilateral frontal periventricular white matter are most consistent with chronic small vessel ischemic change. No signs of acute infarct or hemorrhage. Lateral ventricles and midline structures are otherwise unremarkable. No acute extra-axial fluid collections. No mass effect. Vascular: No hyperdense vessel or unexpected calcification. Skull: Normal. Negative for fracture or focal lesion. Sinuses/Orbits: No acute finding. Other: None. IMPRESSION: 1. Chronic small vessel ischemic changes in the white matter. No acute intracranial process. Electronically Signed   By: Randa Ngo M.D.   On: 03/22/2020 19:47    EKG: Not performed.  Assessment/Plan Principal Problem:   Seizure ([place]) Active Problems:   Hyperlipidemia associated with type 2 diabetes mellitus  ([place])   Hypertension associated with diabetes ([place])   OSA on CPAP   Type 2 diabetes with nephropathy (HCC)   CAD- S/P multiple PCIs, CABG   Chronic combined systolic and diastolic CHF (congestive heart failure) ([place])   ESRD (end stage renal disease) ([place])   Anemia of chronic disease  Matthew Rowe. is a 59 y.o. male with medical history significant for polycystic kidney disease with ESRD on TTS HD, chronic combined systolic and diastolic CHF (EF 40-08% by TTE 07/12/2019), ischemic cardiomyopathy, CAD s/p CABG and DES, IDT2DM, HTN, HLD, anemia of chronic disease, and OSA on CPAP who is admitted for evaluation after seizure episode.  Seizure: Patient with new onset first-time episode of seizure like activity.  Suspect triggered by tramadol use and potentially from recent discontinuation of Neurontin.  He was initially postictal but now has returned to baseline and is fully alert and oriented. -Neurology following, appreciate assistance -Obtain MRI brain without contrast -Obtain EEG -Discontinue tramadol -Seizure precautions -Ativan if needed for recurrent seizure activity  ESRD on TTS HD: Completed usual HD earlier today.  No further need for dialysis tonight.  Will need nephrology consultation if patient remains in hospital for usual dialysis on Tuesday.  Chronic combined systolic and diastolic CHF: Last EF 67-61% by TTE 07/12/2019.  Volume is controlled with dialysis.  Appears euvolemic.  CAD s/p CABG and DES: Chronic and stable, denies any chest pain. -Continue home Coreg, Imdur, Ranexa -Continue atorvastatin -Continue Plavix  Insulin-dependent type 2 diabetes: Place on sensitive SSI while in hospital.  Hypertension: Continue Coreg, Imdur.  Hyperlipidemia: Continue atorvastatin.  Anemia of chronic disease: Chronic and appears stable without obvious bleeding.  Continue monitor.  OSA: Continue CPAP nightly.  DVT prophylaxis: Subcutaneous heparin Code Status: Full  code, confirmed with patient Family Communication: Discussed with patient spouse at bedside Disposition Plan: From home and likely discharge to home pending further seizure eval Consults called: Neurology Admission status:  Status is: Observation  The patient remains OBS appropriate and will d/c before 2 midnights.  Dispo: The patient is from: Home              Anticipated d/c is to: Home              Anticipated d/c date is: 1 day pending seizure eval neurology evaluation.              Patient currently is not medically stable to d/c.  Zada Finders MD [facility]  If 7PM-7AM, please contact night-coverage [email or web address]  03/22/2020, 10:12 PM

## 2020-03-22 NOTE — ED Notes (Addendum)
Pt placed on toliet by RN Dawayne Patricia, this RN to nurses station with NT Broadus , when a noise was heard. This RN and NT Kristen to room , pt appeared to have unwitnessed fall after use of bedside commode. Pt found lying on left side with legs crossed over one another , holding left side of head, pt c/o of pain on the right side of head 8/10 , pt is unsure if he is hurting anywhere else, pt a/o x4, MD Posey Pronto (paged and still shows on call till 2am ) and charge RN [place] notified

## 2020-03-22 NOTE — Consult Note (Signed)
NEURO HOSPITALIST CONSULT NOTE   Requestig physician: Dr. Posey Pronto  Reason for Consult: New onset seizure  History obtained from:  Patient and Chart     HPI:                                                                                                                                          Matthew Rowe Sr. is an 59 y.o. male with a PMHx of ESRD on HD, chronic anemia, chronic combined systolic and diastolic CHF, CAD, DM2, dyslipidemia, HTN, ischemic cardiomyopathy, MI, OSA on CPAP and polycystic kidney disease who presented to the ED via EMS in the late afternoon after he experienced a GTC seizure at home that lasted for about 2 minutes. He bit his tongue during the seizure. He was confused on arrival to the ED. CT head showed no acute abnormality. He has no prior history of seizures. Of note, he takes Ultram and his Neurontin, which was being taken at 300 mg TID, was discontinued 3 weeks ago.  The patient has no prior history of seizures.    Past Medical History:  Diagnosis Date  . Acute on chronic combined systolic and diastolic CHF (congestive heart failure) ([place]) 10/15/2017  . Chronic anemia   . Chronic combined systolic and diastolic CHF (congestive heart failure) ([place])   . Chronic stable angina ([place])   . CKD (chronic kidney disease), stage IV ([place])   . Coronary artery disease    a. INF MI 1999: PCI of RCA;  b.  extensive stenting of LAD;  c. s/p CABG with RIMA->RCA in 2006;  d. Stable, Low risk MV 05/2012; e. 09/2012 Cath: stable anatomy->med Rx. f. Abnl nuc 11/2013 -> med rx; g. 11/2013 Cath/PCI: s/p DES to RCA. h. Abnormal nuc 11/2016, pt declined cath.   . DM2 (diabetes mellitus, type 2) ([facility])    Type II  . Dyslipidemia   . GERD (gastroesophageal reflux disease)   . History of gout   . Hypertension   . Hypertensive heart disease   . Ischemic cardiomyopathy   . Mild aortic stenosis   . Myocardial infarction [facility]) 1999; 2002; 2003; 2006; ~ 2008  . OSA on CPAP     wears CPAP  . Polycystic kidney disease     Past Surgical History:  Procedure Laterality Date  . AV FISTULA PLACEMENT Left 05/29/2018   Procedure: ARTERIOVENOUS (AV) FISTULA CREATION  LEFT ARM.;  Surgeon: Serafina Mitchell, MD;  Location: [place];  Service: Vascular;  Laterality: Left;  . BASCILIC VEIN TRANSPOSITION Left 07/24/2018   Procedure: SECOND STAGE BASCILIC VEIN TRANSPOSITION LEFT ARM;  Surgeon: Serafina Mitchell, MD;  Location: [place];  Service: Vascular;  Laterality: Left;  . CARDIAC CATHETERIZATION  2012  . CARDIAC CATHETERIZATION  2013  .  CARDIAC CATHETERIZATION  2014   LHC (1/14): total occlusion of distal LAD, diffuse disease in ramus, 70% proximal LCx, 50% proximal and mid RCA, 50% distal RCA, patent RIMA-RCA.  Marland Kitchen CORONARY ANGIOPLASTY WITH STENT PLACEMENT  1999 / 2002 / 2004 / 2006?  . CORONARY ARTERY BYPASS GRAFT  2006   CABG X?; in [place]  . CYSTECTOMY  1990's   off face  . HERNIA REPAIR     inguinal hernia  . LEFT HEART CATH AND CORONARY ANGIOGRAPHY N/A 07/13/2019   Procedure: LEFT HEART CATH AND CORONARY ANGIOGRAPHY;  Surgeon: Nelva Bush, MD;  Location: [place] CV LAB;  Service: Cardiovascular;  Laterality: N/A;  . LEFT HEART CATHETERIZATION WITH CORONARY/GRAFT ANGIOGRAM N/A 09/16/2012   Procedure: LEFT HEART CATHETERIZATION WITH [name];  Surgeon: [name], MD;  Location: [facility] CATH LAB;  Service: Cardiovascular;  Laterality: N/A;  . LEFT HEART CATHETERIZATION WITH CORONARY/GRAFT ANGIOGRAM N/A 12/02/2013   Procedure: LEFT HEART CATHETERIZATION WITH [name];  Surgeon: [name], MD;  Location: [place] CATH LAB;  Service: Cardiovascular;  Laterality: N/A;  . PERCUTANEOUS CORONARY STENT INTERVENTION (PCI-S) N/A 12/03/2013   Procedure: PERCUTANEOUS CORONARY STENT INTERVENTION (PCI-S);  Surgeon: [name], MD;  Location: [facility] CATH LAB;  Service: Cardiovascular;  Laterality: N/A;  . RIGHT/LEFT HEART CATH AND CORONARY/GRAFT  ANGIOGRAPHY N/A 06/23/2018   Procedure: RIGHT/LEFT HEART CATH AND CORONARY/GRAFT ANGIOGRAPHY;  Surgeon: [name], MD;  Location: [place] CV LAB;  Service: Cardiovascular;  Laterality: N/A;    Family History  Problem Relation Age of Onset  . Lung cancer Mother   . Anemia Mother   . Polycystic kidney disease Mother   . Diabetes Father   . Heart attack Father 38       Died suddenly  . Heart failure Father   . Hyperlipidemia Father   . Hypertension Father   . Kidney failure Sister 5       Died  . Heart attack Sister   . Hypertension Sister   . Diabetes Sister   . Diabetes Brother   . Polycystic kidney disease Brother   . Diabetes Sister               Social History:  reports that he has quit smoking. His smoking use included cigarettes. He has never used smokeless tobacco. He reports that he does not drink alcohol and does not use drugs.  Allergies  Allergen Reactions  . Cleocin [Clindamycin Hcl] Anaphylaxis and Swelling  . Glimepiride [Amaryl] Other (See Comments)    Elevates liver function   . Clarithromycin Itching and Other (See Comments)    "Biaxin" Eyes itch and burn  . Colchicine Other (See Comments)    Affected kidneys   . Ciprocin-Fluocin-Procin [Fluocinolone Acetonide] Rash    HOME MEDICATIONS:                                                                                                                      No current facility-administered medications  on file prior to encounter.   Current Outpatient Medications on File Prior to Encounter  Medication Sig Dispense Refill  . acetaminophen (TYLENOL) 500 MG tablet Take 500 mg by mouth 2 (two) times daily as needed (for pain or headaches).     Marland Kitchen allopurinol (ZYLOPRIM) 100 MG tablet Take 50 mg by mouth 2 (two) times daily.     Marland Kitchen atorvastatin (LIPITOR) 80 MG tablet Take 1 tablet (80 mg total) by mouth daily. 90 tablet 1  . Calcium Acetate 667 MG TABS Take 667 mg by mouth See admin instructions. Takes  1 tablet in the morning, and 2 tablets in the evening.    . carvedilol (COREG) 25 MG tablet Take 1 tablet (25 mg total) by mouth 2 (two) times daily with a meal. Skip AM dose on dialysis days (Patient taking differently: Take 25 mg by mouth 2 (two) times daily with a meal. ) 180 tablet 3  . Cholecalciferol 2000 units TABS Take 2,000 Units by mouth daily.     . clopidogrel (PLAVIX) 75 MG tablet TAKE ONE TABLET BY MOUTH ONCE DAILY WITH BREAKFAST (Patient taking differently: Take 75 mg by mouth daily. ) 90 tablet 1  . diphenhydrAMINE (BENADRYL) 25 MG tablet Take 25 mg by mouth See admin instructions. Takes on dialysis days for itching (M,W,F).    . furosemide (LASIX) 80 MG tablet Take 80 mg by mouth as needed for fluid.     Marland Kitchen gabapentin (NEURONTIN) 300 MG capsule Take 1 capsule (300 mg total) by mouth daily. (Patient taking differently: Take 300 mg by mouth 3 (three) times daily. ) 30 capsule 0  . glucose blood (ACCU-CHEK AVIVA PLUS) test strip 1 each by Other route in the morning, at noon, and at bedtime.    . insulin aspart protamine- aspart (NOVOLOG MIX 70/30) (70-30) 100 UNIT/ML injection Inject 20 Units into the skin as needed (if blood sugar is over 180).     . isosorbide mononitrate (IMDUR) 60 MG 24 hr tablet Take 1 tablet (60 mg total) by mouth 2 (two) times daily. 60 tablet 0  . lidocaine-prilocaine (EMLA) cream Apply 1 application topically 3 (three) times a week.    . Misc. Devices MISC by Does not apply route. C-PAP    . Multiple Vitamin (MULTIVITAMIN WITH MINERALS) TABS tablet Take 1 tablet by mouth daily.    . multivitamin (RENA-VIT) TABS tablet Take 1 tablet by mouth daily.    . nitroGLYCERIN (NITROSTAT) 0.4 MG SL tablet Place 1 tablet (0.4 mg total) under the tongue every 5 (five) minutes as needed for chest pain. 25 tablet 5  . pantoprazole (PROTONIX) 40 MG tablet Take 1 tablet (40 mg total) by mouth daily. 90 tablet 3  . ranolazine (RANEXA) 500 MG 12 hr tablet Take 1 tablet (500 mg  total) by mouth 2 (two) times daily. (Patient taking differently: Take 1,000 mg by mouth 2 (two) times daily. ) 180 tablet 3  . Semaglutide (OZEMPIC, 0.25 OR 0.5 MG/DOSE, ) Inject 0.25 mg into the skin every Saturday.    . traMADol (ULTRAM) 50 MG tablet Take 1 tablet (50 mg total) by mouth every 12 (twelve) hours as needed for moderate pain. 12 tablet 0  . vitamin B-12 (CYANOCOBALAMIN) 1000 MCG tablet Take 1,000 mcg by mouth See admin instructions. On M,W,F - Dialysis days.    Marland Kitchen zolpidem (AMBIEN) 10 MG tablet Take 10 mg by mouth at bedtime as needed for sleep.       ROS:  The patient is confused and unable to provide a reliable ROS.    Blood pressure 121/72, pulse 63, temperature 98.4 F (36.9 C), temperature source Oral, resp. rate (!) 33, height 5' 5.5" (1.664 m), weight 97 kg, SpO2 98 %.   General Examination:                                                                                                       Physical Exam  HEENT-  [place]/AT. Has a tongue bite to the right side of his tongue.   Lungs- Respirations unlabored Extremities- No edema  Neurological Examination Mental Status: Awake and mildly confused. Oriented to city, state, month, year and day of the week. Speech subtly dysarthric and fluent. Comprehension intact. Naming intact. Cranial Nerves: II: Visual fields intact with no extinction to DSS. PERRL.   III,IV, VI: No ptosis. EOMI.   V,VII: Smile symmetric. Facial temp sensation equal bilaterally VIII: hearing intact to voice IX,X: No hypophonia XI: Symmetric XII: Midline tongue extension Motor: Right : Upper extremity   5/5    Left:     Upper extremity   5/5  Lower extremity   5/5     Lower extremity   5/5 Sensory: Temp and light touch intact throughout, bilaterally. No extinction to DSS.  Deep Tendon Reflexes: 2+ and symmetric  throughout Plantars: Right: downgoing   Left: downgoing Cerebellar: No ataxia with FNF bilaterally  Gait: Deferred   Lab Results: Basic Metabolic Panel: Recent Labs  Lab 03/22/20 1800 03/22/20 1819  NA 131* 134*  K 4.3 4.2  CL 89* 90*  CO2 30  --   GLUCOSE 147* 143*  BUN 26* 28*  CREATININE 4.48* 4.60*  CALCIUM 9.2  --   MG 2.3  --   PHOS 2.4*  --     CBC: Recent Labs  Lab 03/22/20 1800 03/22/20 1819  WBC 8.2  --   NEUTROABS 6.3  --   HGB 12.2* 12.9*  HCT 36.2* 38.0*  MCV 89.2  --   PLT 124*  --     Cardiac Enzymes: No results for input(s): CKTOTAL, CKMB, CKMBINDEX, TROPONINI in the last 168 hours.  Lipid Panel: No results for input(s): CHOL, TRIG, HDL, CHOLHDL, VLDL, LDLCALC in the last 168 hours.  Imaging: CT HEAD WO CONTRAST  Result Date: 03/22/2020 CLINICAL DATA:  Seizure, abnormal neurologic exam EXAM: CT HEAD WITHOUT CONTRAST TECHNIQUE: Contiguous axial images were obtained from the base of the skull through the vertex without intravenous contrast. COMPARISON:  None. FINDINGS: Brain: Hypodensities in the bilateral frontal periventricular white matter are most consistent with chronic small vessel ischemic change. No signs of acute infarct or hemorrhage. Lateral ventricles and midline structures are otherwise unremarkable. No acute extra-axial fluid collections. No mass effect. Vascular: No hyperdense vessel or unexpected calcification. Skull: Normal. Negative for fracture or focal lesion. Sinuses/Orbits: No acute finding. Other: None. IMPRESSION: 1. Chronic small vessel ischemic changes in the white matter. No acute intracranial process. Electronically Signed   By: Randa Ngo M.D.   On: 03/22/2020 19:47    Assessment:  New onset seizure in a 59 year old ESRD patient. May have been precipitated by tramadol use as well as discontinuation of his 300 mg TID Neurontin 3 weeks ago.  1. Exam is nonfocal. Mild drowsiness and right sided tongue bite are noted.  2. CT  head: Chronic small vessel ischemic changes in the white matter. No acute intracranial process.  Recommendations: 1. MRI brain without contrast 2. EEG in AM 3. No anticonvulsant indicated at this time as this was a first time seizure, most likely precipitated by Ultram use (Ultram can lower the seizure threshold) in conjunction with recent discontinuation of Neurontin (discontinuation of Neurontin can lower the seizure threshold). 4. Inpatient seizure precautions. 5. Outpatient seizure precautions: Per [facility] statutes, patients with seizures are not allowed to drive until  they have been seizure-free for six months. Use caution when using heavy equipment or power tools. Avoid working on ladders or at heights. Take showers instead of baths. Ensure the water temperature is not too high on the home water heater. Do not go swimming alone. When caring for infants or small children, sit down when holding, feeding, or changing them to minimize risk of injury to the child in the event you have a seizure. Also, Maintain good sleep hygiene. Avoid alcohol. 6. Discontinue Ultram 7. The patient does not wish to restart Neurontin due to side effects.    Electronically signed: Dr. Kerney Elbe 03/22/2020, 9:27 PM

## 2020-03-22 NOTE — ED Notes (Signed)
Patient transported to CT via stretcher.

## 2020-03-22 NOTE — ED Provider Notes (Signed)
  Face-to-face evaluation   History: Patient presents for evaluation of tonic-clonic seizure, after dialysis, earlier today.  His wife is unsure but thinks he may have been seizing for a few minutes, and was in postictal, until EMS arrived.  Physical exam: I evaluated the patient 9:25 PM.  At this time he is somnolent after receiving Ativan, given for seizure prevention.  No respiratory distress.  Medical screening examination/treatment/procedure(s) were conducted as a shared visit with non-physician practitioner(s) and myself.  I personally evaluated the patient during the encounter    Daleen Bo, MD 03/23/20 2203

## 2020-03-22 NOTE — ED Notes (Signed)
Pt transferred to bedside commode x1 assist, call bell in reach.

## 2020-03-22 NOTE — ED Notes (Addendum)
Vitals updated due to patient fall.

## 2020-03-22 NOTE — ED Notes (Addendum)
This RN called [facility] regarding pt's covid swab status, was told that swab was not in lab. Will recollect & send

## 2020-03-22 NOTE — ED Notes (Signed)
MD advised EKG not needed

## 2020-03-23 ENCOUNTER — Observation Stay (HOSPITAL_COMMUNITY): Payer: No Typology Code available for payment source

## 2020-03-23 DIAGNOSIS — Z9861 Coronary angioplasty status: Secondary | ICD-10-CM

## 2020-03-23 DIAGNOSIS — R569 Unspecified convulsions: Principal | ICD-10-CM

## 2020-03-23 DIAGNOSIS — G4733 Obstructive sleep apnea (adult) (pediatric): Secondary | ICD-10-CM

## 2020-03-23 DIAGNOSIS — I10 Essential (primary) hypertension: Secondary | ICD-10-CM

## 2020-03-23 DIAGNOSIS — I5042 Chronic combined systolic (congestive) and diastolic (congestive) heart failure: Secondary | ICD-10-CM

## 2020-03-23 DIAGNOSIS — N186 End stage renal disease: Secondary | ICD-10-CM | POA: Diagnosis not present

## 2020-03-23 DIAGNOSIS — E1159 Type 2 diabetes mellitus with other circulatory complications: Secondary | ICD-10-CM

## 2020-03-23 DIAGNOSIS — E1121 Type 2 diabetes mellitus with diabetic nephropathy: Secondary | ICD-10-CM

## 2020-03-23 DIAGNOSIS — E785 Hyperlipidemia, unspecified: Secondary | ICD-10-CM

## 2020-03-23 DIAGNOSIS — I2510 Atherosclerotic heart disease of native coronary artery without angina pectoris: Secondary | ICD-10-CM | POA: Diagnosis not present

## 2020-03-23 DIAGNOSIS — Z9989 Dependence on other enabling machines and devices: Secondary | ICD-10-CM

## 2020-03-23 DIAGNOSIS — D638 Anemia in other chronic diseases classified elsewhere: Secondary | ICD-10-CM | POA: Diagnosis not present

## 2020-03-23 DIAGNOSIS — E1169 Type 2 diabetes mellitus with other specified complication: Secondary | ICD-10-CM

## 2020-03-23 LAB — CBC WITH DIFFERENTIAL/PLATELET
Abs Immature Granulocytes: 0.03 10*3/uL (ref 0.00–0.07)
Basophils Absolute: 0 10*3/uL (ref 0.0–0.1)
Basophils Relative: 1 %
Eosinophils Absolute: 0.1 10*3/uL (ref 0.0–0.5)
Eosinophils Relative: 1 %
HCT: 35.6 % — ABNORMAL LOW (ref 39.0–52.0)
Hemoglobin: 11.6 g/dL — ABNORMAL LOW (ref 13.0–17.0)
Immature Granulocytes: 0 %
Lymphocytes Relative: 16 %
Lymphs Abs: 1.3 10*3/uL (ref 0.7–4.0)
MCH: 29.4 pg (ref 26.0–34.0)
MCHC: 32.6 g/dL (ref 30.0–36.0)
MCV: 90.4 fL (ref 80.0–100.0)
Monocytes Absolute: 1 10*3/uL (ref 0.1–1.0)
Monocytes Relative: 13 %
Neutro Abs: 5.5 10*3/uL (ref 1.7–7.7)
Neutrophils Relative %: 69 %
Platelets: 128 10*3/uL — ABNORMAL LOW (ref 150–400)
RBC: 3.94 MIL/uL — ABNORMAL LOW (ref 4.22–5.81)
RDW: 17.9 % — ABNORMAL HIGH (ref 11.5–15.5)
WBC: 7.9 10*3/uL (ref 4.0–10.5)
nRBC: 0.4 % — ABNORMAL HIGH (ref 0.0–0.2)

## 2020-03-23 LAB — PHOSPHORUS: Phosphorus: 2.6 mg/dL (ref 2.5–4.6)

## 2020-03-23 LAB — COMPREHENSIVE METABOLIC PANEL
ALT: 29 U/L (ref 0–44)
AST: 33 U/L (ref 15–41)
Albumin: 3.7 g/dL (ref 3.5–5.0)
Alkaline Phosphatase: 126 U/L (ref 38–126)
Anion gap: 14 (ref 5–15)
BUN: 38 mg/dL — ABNORMAL HIGH (ref 6–20)
CO2: 24 mmol/L (ref 22–32)
Calcium: 8.8 mg/dL — ABNORMAL LOW (ref 8.9–10.3)
Chloride: 91 mmol/L — ABNORMAL LOW (ref 98–111)
Creatinine, Ser: 5.43 mg/dL — ABNORMAL HIGH (ref 0.61–1.24)
GFR calc Af Amer: 12 mL/min — ABNORMAL LOW (ref >60)
GFR calc non Af Amer: 11 mL/min — ABNORMAL LOW (ref >60)
Glucose, Bld: 95 mg/dL (ref 70–99)
Potassium: 4.4 mmol/L (ref 3.5–5.1)
Sodium: 129 mmol/L — ABNORMAL LOW (ref 135–145)
Total Bilirubin: 2.1 mg/dL — ABNORMAL HIGH (ref 0.3–1.2)
Total Protein: 6.9 g/dL (ref 6.5–8.1)

## 2020-03-23 LAB — HEMOGLOBIN A1C
Hgb A1c MFr Bld: 6.3 % — ABNORMAL HIGH (ref 4.8–5.6)
Mean Plasma Glucose: 134.11 mg/dL

## 2020-03-23 LAB — CBG MONITORING, ED
Glucose-Capillary: 95 mg/dL (ref 70–99)
Glucose-Capillary: 91 mg/dL (ref 70–99)
Glucose-Capillary: 107 mg/dL — ABNORMAL HIGH (ref 70–99)

## 2020-03-23 LAB — MAGNESIUM: Magnesium: 2.1 mg/dL (ref 1.7–2.4)

## 2020-03-23 MED ORDER — VALPROATE SODIUM 500 MG/5ML IV SOLN
500.0000 mg | Freq: Two times a day (BID) | INTRAVENOUS | Status: DC
  Filled 2020-03-23: qty 5

## 2020-03-23 MED ORDER — VALPROATE SODIUM 500 MG/5ML IV SOLN
1000.0000 mg | Freq: Once | INTRAVENOUS | Status: AC
  Administered 2020-03-23: 1000 mg via INTRAVENOUS
  Filled 2020-03-23: qty 10

## 2020-03-23 MED ORDER — DIVALPROEX SODIUM 500 MG PO DR TAB
500.0000 mg | DELAYED_RELEASE_TABLET | Freq: Two times a day (BID) | ORAL | 0 refills | Status: AC
Start: 2020-03-23 — End: ?

## 2020-03-23 NOTE — ED Notes (Signed)
Pt found sitting on the end of the bed . Pt reported he saw something strange out side his door. Pt instructed he saw the staff ridding the floor cleaner.

## 2020-03-23 NOTE — ED Notes (Signed)
Patient transported to MRI 

## 2020-03-23 NOTE — ED Notes (Signed)
Pt returned from MRI °

## 2020-03-23 NOTE — ED Notes (Signed)
Pt on the phone with spouse at this time.

## 2020-03-23 NOTE — Discharge Summary (Signed)
Physician Discharge Summary  Giannis Corpuz ZOX:[id number] DOB: 02/23/1961 DOA: 03/22/2020  PCP: [name], MD  Admit date: 03/22/2020 Discharge date: 03/23/2020  Admitted From: Home Disposition: Home  Recommendations for Outpatient Follow-up:  1. Follow up with PCP in 1-2 weeks 2. Follow up with Neurology within 1-2 weeks 3. Follow up with Nephrology within 1-2 weeks 4. Please obtain CMP/CBC, Mag, Phos in one week 5. Please follow up on the following pending results:  Home Health: No Equipment/Devices: None    Discharge Condition: Stable CODE STATUS: FULL CODE Diet recommendation: Heart healthy Carb Modified Renal Diet with 1200 mL Fluid Restriction  Brief/Interim Summary: HPI per Dr. Zada Finders on 03/22/20 Karie Kirks Rowe. is a 59 y.o. male with medical history significant for polycystic kidney disease with ESRD on TTS HD, chronic combined systolic and diastolic CHF (EF 81-19% by TTE 07/12/2019), ischemic cardiomyopathy, CAD s/p CABG and DES, IDT2DM, HTN, HLD, anemia of chronic disease, and OSA on CPAP who presents to the ED for evaluation after new onset seizure like activity.  Patient states over the last few days he was told that he had abnormal sodium and calcium levels.  He has been taking calcium supplements and reportedly was also taking sodium tablets.  He had been having some cramping sensation in both of his legs.  He went to his usual dialysis session earlier today on 03/22/2020 and prior to dialysis was told he had high sodium and calcium levels.  He did complete dialysis without complication.  When he returned home he lied down and shortly afterwards his wife walked into the room and noticed he was having significant shaking of upper extremities and appeared to have seizure-like activity.  He was unconscious during this time period which lasted about 2 minutes.  His wife called EMS and after their arrival he regained consciousness.  He felt confused.  He did  not have any loss of bowel/bladder control.  He denies any recent chest pain, palpitations, dyspnea, abdominal pain, nausea, vomiting, subjective fevers, chills, or diaphoresis.  He says he still makes urine without dysuria.  He denies any recent diarrhea.  He has not had any swelling in his lower extremities.  Of note, patient was recently seen in the ED on 02/03/2020 with twitching/myoclonic symptoms and progressive back and leg pain with weakness x4 days.  He underwent MRI evaluation of the whole spine without evidence of spinal cord or cauda equina abnormality.  Pontine lacunar infarcts were seen as well as severe polycystic renal disease with evidence of progression.  Mild to moderate degenerative neural foraminal stenosis in the cervical and lower lumbar spines were noted.  At that time he was noted to have had a recent increase in his home gabapentin from once a day to 3 times daily.  This was felt related to his symptoms and neurology recommended discontinuing it and then restarting at prior dose 1 week later.  It appears he has been off gabapentin since then.  He has been on tramadol.  ED Course:  Initial vitals showed BP 121/66, pulse 66, RR 17, temp 98.4 Fahrenheit, SPO2 100% on room air.  Labs are notable for WBC 8.2, hemoglobin 12.2, platelets 124,000, sodium 131, potassium 4.3, bicarb 30, BUN 26, creatinine 4.48, AST 33, ALT 29, alk phos 146, total bilirubin 1.5, serum glucose 147, phosphorus 2.4, magnesium 2.3, serum ethanol undetectable.  SARS-CoV-2 PCR is collected and pending.  CT head without contrast shows chronic small vessel ischemic changes in the white  matter with no acute intracranial process seen.  Patient was given 1 mg IV Ativan.  Neurology were consulted and recommended medical admission for further evaluation.  The hospitalist service was consulted to admit for further evaluation management.  ** He wasworked up for his seizure and had no further seizure activity.   He ended up having an MRI of the brain without contrast shows motion degraded study without any acute intracranial findings.  There are no focal cortical abnormality to explain the seizure but there is also a left scalp contusion noted and chronic white matter disease.  Patient also underwent an EEG which was suggestive of mild diffuse encephalopathy which is nonspecific in etiology but no seizures or epileptiform discharges were seen throughout the recording.  Neurology evaluated the patient and cleared the patient for discharge and Dr. Rory Percy placed the patient on IV Depakote load and then recommended changing to p.o. Depakote 500 mg p.o. twice daily at discharge.  A referral was given to [facility] for outpatient evaluation and neurology recommended continue to hold on Ultram and maintain seizure precautions.  He was told to not drive 6 months seizure-free per [facility].  Dr. Rory Percy recommended further work-up of movement disorder in the outpatient setting recommends he follow-up with [facility] do this.  He is back to baseline and Dr. Malen Gauze feels that he could also have dialysis disequilibrium syndrome versus a seizure but he recommends antiepileptics anyway.  Patient is currently stable to be discharged home and he will need follow-up with PCP, neurology in outpatient setting.  Discharge Diagnoses:  Principal Problem:   Seizure ([place]) Active Problems:   Hyperlipidemia associated with type 2 diabetes mellitus ([place])   Hypertension associated with diabetes ([place])   OSA on CPAP   Type 2 diabetes with nephropathy (H[facility])   CAD- S/P multiple PCIs, CABG   Chronic combined systolic and diastolic CHF (congestive heart failure) (H[facility])   ESRD (end stage renal disease) ([place])   Anemia of chronic disease  Seizure: Patient with new onset first-time episode of seizure like activity.  Suspect triggered by tramadol use and potentially from recent discontinuation of Neurontin.  He was initially postictal but now has  returned to baseline and is fully alert and oriented. -Neurology following, appreciate assistance -Obtain MRI brain without contrast and it showed no focal cortical abnormality to explain a seizure but there is a left scalp contusion and chronic white matter disease -Obtain EEG and showed no seizure activity or epileptiform discharges -Discontinue tramadol indefinitely -Seizure precautions -Ativan if needed for recurrent seizure activity -neurology recommends starting the patient on Depakote and he was loaded with IV Depakote and will be continuing 500 mg of Depakote twice daily with outpatient follow-up [facility] for further work-up of his movement disorder  ESRD on TTS HD: -Completed usual HD earlier yesterday.  No further need for dialysis today.  Will need nephrology consultation if patient remains in hospital for usual dialysis on Tuesday. -He will be discharged and will need to follow-up with PCP  Chronic combined systolic and diastolic CHF: Last EF 50-53% by TTE 07/12/2019.  Volume is controlled with dialysis.  Appears euvolemic.  CAD s/p CABG and DES: -Chronic and stable, denies any chest pain. -Continue home Coreg, Imdur, Ranexa -Continue atorvastatin -Continue Plavix  Insulin-dependent type 2 diabetes: -Place on sensitive SSI while in hospital. -CBGs ranging from 91-107  Hypertension: -Continue Coreg, Imdur.  Hyperlipidemia: -Continue atorvastatin.  Anemia of chronic disease: -Chronic and appears stable without obvious bleeding.  Continue monitor. -  Hemoglobin/hematocrit went from 12.9/30.0 is now 11.6/35.6 Plan check anemia panel in the outpatient setting continue to monitor for signs and symptoms of bleeding -Repeat CBC in outpatient setting  Hyponatremia -Mild and could have contributed to his seizure activity  -Sodium 1 from 134 down to 129-continue monitor and trend in the outpatient setting  OSA: -Continue CPAP nightly. -Not wearing CPAP last night and had  to be given at this morning  Thrombocytopenia Patient's platelet count went from 124,000 is now 128,000 next-continue to monitor and trend in the outpatient setting repeat CBC within 1 week  Obesity -Estimated body mass index is 35.04 kg/m as calculated from the following:   Height as of this encounter: 5' 5.5" (1.664 m).   Weight as of this encounter: 97 kg. -Weight Loss and Dietary Counseling given   Discharge Instructions  Discharge Instructions    Ambulatory referral to Neurology   Complete by: As directed    An appointment is requested in approximately: 4 weeks   Call MD for:  difficulty breathing, headache or visual disturbances   Complete by: As directed    Call MD for:  extreme fatigue   Complete by: As directed    Call MD for:  hives   Complete by: As directed    Call MD for:  persistant dizziness or light-headedness   Complete by: As directed    Call MD for:  persistant nausea and vomiting   Complete by: As directed    Call MD for:  redness, tenderness, or signs of infection (pain, swelling, redness, odor or green/yellow discharge around incision site)   Complete by: As directed    Call MD for:  severe uncontrolled pain   Complete by: As directed    Call MD for:  temperature >100.4   Complete by: As directed    Diet - low sodium heart healthy   Complete by: As directed    Fluid Restricted to 1200 mL   Diet Carb Modified   Complete by: As directed    RENAL DIET   Discharge instructions   Complete by: As directed    You were cared for by a hospitalist during your hospital stay. If you have any questions about your discharge medications or the care you received while you were in the hospital after you are discharged, you can call the unit and ask to speak with the hospitalist on call if the hospitalist that took care of you is not available. Once you are discharged, your primary care physician will handle any further medical issues. Please note that NO REFILLS for any  discharge medications will be authorized once you are discharged, as it is imperative that you return to your primary care physician (or establish a relationship with a primary care physician if you do not have one) for your aftercare needs so that they can reassess your need for medications and monitor your lab values.  Follow up with PCP and Neurology within 1-2 weeks. Take all medications as prescribed. If symptoms change or worsen please return to the ED for evaluation; No Driving due seizure Restrictions   Increase activity slowly   Complete by: As directed      Allergies as of 03/23/2020      Reactions   Cleocin [clindamycin Hcl] Anaphylaxis, Swelling   Glimepiride [amaryl] Other (See Comments)   Elevates liver function   Clarithromycin Itching, Other (See Comments)   "Biaxin" Eyes itch and burn   Colchicine Other (See Comments)   Affected kidneys  Ciprocin-fluocin-procin [fluocinolone Acetonide] Rash      Medication List    STOP taking these medications   gabapentin 300 MG capsule Commonly known as: NEURONTIN     TAKE these medications   Accu-Chek Aviva Plus test strip Generic drug: glucose blood 1 each by Other route in the morning, at noon, and at bedtime.   acetaminophen 500 MG tablet Commonly known as: TYLENOL Take 500 mg by mouth 2 (two) times daily as needed (for pain or headaches).   allopurinol 100 MG tablet Commonly known as: ZYLOPRIM Take 50 mg by mouth 2 (two) times daily.   atorvastatin 80 MG tablet Commonly known as: LIPITOR Take 1 tablet (80 mg total) by mouth daily.   Calcium Acetate 667 MG Tabs Take 667-1,334 mg by mouth See admin instructions. Takes 1 tablet in the morning, and take 2 tablets in the evening.   carvedilol 25 MG tablet Commonly known as: COREG Take 1 tablet (25 mg total) by mouth 2 (two) times daily with a meal. Skip AM dose on dialysis days What changed: additional instructions   Cholecalciferol 50 MCG (2000 UT) Tabs Take  2,000 Units by mouth daily.   clopidogrel 75 MG tablet Commonly known as: PLAVIX TAKE ONE TABLET BY MOUTH ONCE DAILY WITH BREAKFAST What changed: See the new instructions.   diphenhydrAMINE 25 MG tablet Commonly known as: BENADRYL Take 25 mg by mouth See admin instructions. Takes on dialysis days for itching (M,W,F).   divalproex 500 MG DR tablet Commonly known as: Depakote Take 1 tablet (500 mg total) by mouth 2 (two) times daily.   insulin aspart protamine- aspart (70-30) 100 UNIT/ML injection Commonly known as: NOVOLOG MIX 70/30 Inject 20 Units into the skin as needed (if blood sugar is over 180).   isosorbide mononitrate 60 MG 24 hr tablet Commonly known as: IMDUR Take 1 tablet (60 mg total) by mouth 2 (two) times daily.   lidocaine-prilocaine cream Commonly known as: EMLA Apply 1 application topically 3 (three) times a week.   Misc. Devices Misc by Does not apply route. C-PAP   multivitamin with minerals Tabs tablet Take 1 tablet by mouth daily.   nitroGLYCERIN 0.4 MG SL tablet Commonly known as: NITROSTAT Place 1 tablet (0.4 mg total) under the tongue every 5 (five) minutes as needed for chest pain.   OZEMPIC (0.25 OR 0.5 MG/DOSE) [place] Inject 0.25 mg into the skin every Saturday.   pantoprazole 40 MG tablet Commonly known as: PROTONIX Take 1 tablet (40 mg total) by mouth daily.   ranolazine 500 MG 12 hr tablet Commonly known as: Ranexa Take 1 tablet (500 mg total) by mouth 2 (two) times daily. What changed: how much to take   vitamin B-12 1000 MCG tablet Commonly known as: CYANOCOBALAMIN Take 1,000 mcg by mouth See admin instructions. On M,W,F - Dialysis days.   zolpidem 10 MG tablet Commonly known as: AMBIEN Take 10 mg by mouth at bedtime as needed for sleep.       Allergies  Allergen Reactions  . Cleocin [Clindamycin Hcl] Anaphylaxis and Swelling  . Glimepiride [Amaryl] Other (See Comments)    Elevates liver function   . Clarithromycin Itching  and Other (See Comments)    "Biaxin" Eyes itch and burn  . Colchicine Other (See Comments)    Affected kidneys   . Ciprocin-Fluocin-Procin [Fluocinolone Acetonide] Rash    Consultations:  Neurology  Procedures/Studies: EEG now  Result Date: 03/23/2020 Lora Havens, MD     03/23/2020 12:04 PM  Patient Name: Matthew Rowe. MRN: [id number] Epilepsy Attending: Lora Havens Referring Physician/Provider: Dr Zada Finders Date: 03/23/2020 Duration: 23.32 mins Patient history: 59 yo M ESRD patient with new onset seizure. EEG to evaluate for seizure. Level of alertness: Awake AEDs during EEG study: None Technical aspects: This EEG study was done with scalp electrodes positioned according to the 10-20 International system of electrode placement. Electrical activity was acquired at a sampling rate of _0  and reviewed with a high frequency filter of _1  and a low frequency filter of _2 . EEG data were recorded continuously and digitally stored. Description: The posterior dominant rhythm consists of 7 Hz activity of moderate voltage (25-35 uV) seen predominantly in posterior head regions, symmetric and reactive to eye opening and eye closing. EEG showed continuous generalized 5 to 6 Hz theta slowing as well as intermittent 2-_3  generalized rhythmic delta slowing. Physiology photic driving was not seen during photic stimulation.  Hyperventilation was not performed.   ABNORMALITY - Continuous slow, generalized - Intermittent rhythmic slow, generalized - Background slow IMPRESSION: This study is suggestive of mild diffuse encephalopathy, nonspecific etiology. No seizures or epileptiform discharges were seen throughout the recording. Lora Havens   CT HEAD WO CONTRAST  Result Date: 03/22/2020 CLINICAL DATA:  Seizure, abnormal neurologic exam EXAM: CT HEAD WITHOUT CONTRAST TECHNIQUE: Contiguous axial images were obtained from the base of the skull through the vertex without intravenous contrast.  COMPARISON:  None. FINDINGS: Brain: Hypodensities in the bilateral frontal periventricular white matter are most consistent with chronic small vessel ischemic change. No signs of acute infarct or hemorrhage. Lateral ventricles and midline structures are otherwise unremarkable. No acute extra-axial fluid collections. No mass effect. Vascular: No hyperdense vessel or unexpected calcification. Skull: Normal. Negative for fracture or focal lesion. Sinuses/Orbits: No acute finding. Other: None. IMPRESSION: 1. Chronic small vessel ischemic changes in the white matter. No acute intracranial process. Electronically Signed   By: Randa Ngo M.D.   On: 03/22/2020 19:47   MR BRAIN WO CONTRAST  Result Date: 03/23/2020 CLINICAL DATA:  Nontraumatic seizure after dialysis today. EXAM: MRI HEAD WITHOUT CONTRAST TECHNIQUE: Multiplanar, multiecho pulse sequences of the brain and surrounding structures were obtained without intravenous contrast. COMPARISON:  Head CT from yesterday FINDINGS: Brain: No acute infarction, hemorrhage, hydrocephalus, extra-axial collection or mass lesion. FLAIR hyperintensity in the cerebral white matter with remote infarct and wallerian degeneration crossing the body of the corpus callosum. Remote small vessel infarct in the ventral brainstem. Vascular: Normal flow voids. Skull and upper cervical spine: Low marrow signal in the upper cervical spine, likely from patient's dialysis history. Sinuses/Orbits: Proteinaceous retention cyst appearance along the floor of 1 of the sphenoid sinuses. IMPRESSION: 1. Motion degraded study without acute intracranial finding. No focal cortical abnormality to explain seizure. 2. Left scalp contusion. 3. Chronic white matter disease. Electronically Signed   By: Monte Fantasia M.D.   On: 03/23/2020 04:55     Subjective: And examined at bedside he is feeling better.  Denied any more seizure activity and felt stable.   No nausea or vomiting.  No other concerns or  complaints at this time and is ready to go home.   Discharge Exam: Vitals:   03/23/20 1302 03/23/20 1706  BP: (!) 129/102 100/85  Pulse: 64 62  Resp: (!) 24 20  Temp:  97.7 F (36.5 C)  SpO2: 98% 98%   Vitals:   03/23/20 1100 03/23/20 1158 03/23/20 1302 03/23/20 1706  BP: 114/65 110/81 (!) 129/102 100/85  Pulse: (!) 38 65 64 62  Resp: (!) 22 19 (!) 24 20  Temp:    97.7 F (36.5 C)  TempSrc:    Oral  SpO2: (!) 65% 99% 98% 98%  Weight:      Height:       General: Pt is alert, awake, not in acute distress Cardiovascular: RRR, S1/S2 +, no rubs, no gallops Respiratory: Diminished bilaterally, no wheezing, no rhonchi Abdominal: Soft, NT, distended secondary body habitus, bowel sounds + Extremities: no edema, no cyanosis  The results of significant diagnostics from this hospitalization (including imaging, microbiology, ancillary and laboratory) are listed below for reference.    Microbiology: Recent Results (from the past 240 hour(s))  SARS Coronavirus 2 by RT PCR (hospital order, performed in [facility] hospital lab) Nasopharyngeal Nasopharyngeal Swab     Status: None   Collection Time: 03/22/20 10:02 PM   Specimen: Nasopharyngeal Swab  Result Value Ref Range Status   SARS Coronavirus 2 NEGATIVE NEGATIVE Final    Comment: (NOTE) SARS-CoV-2 target nucleic acids are NOT DETECTED.  The SARS-CoV-2 RNA is generally detectable in upper and lower respiratory specimens during the acute phase of infection. The lowest concentration of SARS-CoV-2 viral copies this assay can detect is 250 copies / mL. A negative result does not preclude SARS-CoV-2 infection and should not be used as the sole basis for treatment or other patient management decisions.  A negative result may occur with improper specimen collection / handling, submission of specimen other than nasopharyngeal swab, presence of viral mutation(s) within the areas targeted by this assay, and inadequate number of viral  copies (<250 copies / mL). A negative result must be combined with clinical observations, patient history, and epidemiological information.  Fact Sheet for Patients:   StrictlyIdeas.no  Fact Sheet for Healthcare Providers: BankingDealers.co.za  This test is not yet approved or  cleared by the [place] FDA and has been authorized for detection and/or diagnosis of SARS-CoV-2 by FDA under an Emergency Use Authorization (EUA).  This EUA will remain in effect (meaning this test can be used) for the duration of the COVID-19 declaration under Section 564(b)(1) of the Act, 21 U.S.C. section 360bbb-3(b)(1), unless the authorization is terminated or revoked sooner.  Performed at [facility], [place] [address], [place], [place] [address]     Labs: BNP (last 3 results) Recent Labs    07/10/19 0918  BNP 3,762.8*   Basic Metabolic Panel: Recent Labs  Lab 03/22/20 1800 03/22/20 1819 03/23/20 1230  NA 131* 134* 129*  K 4.3 4.2 4.4  CL 89* 90* 91*  CO2 30  --  24  GLUCOSE 147* 143* 95  BUN 26* 28* 38*  CREATININE 4.48* 4.60* 5.43*  CALCIUM 9.2  --  8.8*  MG 2.3  --  2.1  PHOS 2.4*  --  2.6   Liver Function Tests: Recent Labs  Lab 03/22/20 1800 03/23/20 1230  AST 33 33  ALT 29 29  ALKPHOS 146* 126  BILITOT 1.5* 2.1*  PROT 7.3 6.9  ALBUMIN 3.6 3.7   No results for input(s): LIPASE, AMYLASE in the last 168 hours. No results for input(s): AMMONIA in the last 168 hours. CBC: Recent Labs  Lab 03/22/20 1800 03/22/20 1819 03/23/20 1230  WBC 8.2  --  7.9  NEUTROABS 6.3  --  5.5  HGB 12.2* 12.9* 11.6*  HCT 36.2* 38.0* 35.6*  MCV 89.2  --  90.4  PLT 124*  --  128*   Cardiac  Enzymes: No results for input(s): CKTOTAL, CKMB, CKMBINDEX, TROPONINI in the last 168 hours. BNP: Invalid input(s): POCBNP CBG: Recent Labs  Lab 03/22/20 1819 03/23/20 0757 03/23/20 1226 03/23/20 1708  GLUCAP 145* 95 91 107*    D-Dimer No results for input(s): DDIMER in the last 72 hours. Hgb A1c Recent Labs    03/23/20 1230  HGBA1C 6.3*   Lipid Profile No results for input(s): CHOL, HDL, LDLCALC, TRIG, CHOLHDL, LDLDIRECT in the last 72 hours. Thyroid function studies No results for input(s): TSH, T4TOTAL, T3FREE, THYROIDAB in the last 72 hours.  Invalid input(s): FREET3 Anemia work up No results for input(s): VITAMINB12, FOLATE, FERRITIN, TIBC, IRON, RETICCTPCT in the last 72 hours. Urinalysis    Component Value Date/Time   COLORURINE STRAW (A) 06/20/2018 2033   APPEARANCEUR CLEAR 06/20/2018 2033   LABSPEC 1.006 06/20/2018 2033   PHURINE 6.0 06/20/2018 2033   GLUCOSEU NEGATIVE 06/20/2018 2033   HGBUR NEGATIVE 06/20/2018 2033   BILIRUBINUR NEGATIVE 06/20/2018 2033   [place] NEGATIVE 06/20/2018 2033   PROTEINUR NEGATIVE 06/20/2018 2033   UROBILINOGEN 1.0 07/17/2015 1425   NITRITE NEGATIVE 06/20/2018 2033   LEUKOCYTESUR NEGATIVE 06/20/2018 2033   Sepsis Labs Invalid input(s): PROCALCITONIN,  WBC,  LACTICIDVEN Microbiology Recent Results (from the past 240 hour(s))  SARS Coronavirus 2 by RT PCR (hospital order, performed in [facility] hospital lab) Nasopharyngeal Nasopharyngeal Swab     Status: None   Collection Time: 03/22/20 10:02 PM   Specimen: Nasopharyngeal Swab  Result Value Ref Range Status   SARS Coronavirus 2 NEGATIVE NEGATIVE Final    Comment: (NOTE) SARS-CoV-2 target nucleic acids are NOT DETECTED.  The SARS-CoV-2 RNA is generally detectable in upper and lower respiratory specimens during the acute phase of infection. The lowest concentration of SARS-CoV-2 viral copies this assay can detect is 250 copies / mL. A negative result does not preclude SARS-CoV-2 infection and should not be used as the sole basis for treatment or other patient management decisions.  A negative result may occur with improper specimen collection / handling, submission of specimen other than  nasopharyngeal swab, presence of viral mutation(s) within the areas targeted by this assay, and inadequate number of viral copies (<250 copies / mL). A negative result must be combined with clinical observations, patient history, and epidemiological information.  Fact Sheet for Patients:   StrictlyIdeas.no  Fact Sheet for Healthcare Providers: BankingDealers.co.za  This test is not yet approved or  cleared by the [place] FDA and has been authorized for detection and/or diagnosis of SARS-CoV-2 by FDA under an Emergency Use Authorization (EUA).  This EUA will remain in effect (meaning this test can be used) for the duration of the COVID-19 declaration under Section 564(b)(1) of the Act, 21 U.S.C. section 360bbb-3(b)(1), unless the authorization is terminated or revoked sooner.  Performed at [facility], [place] [address] [place], [place] [address]    Time coordinating discharge: 35 minutes  SIGNED:  Kerney Elbe, DO [facility] 03/23/2020, 7:25 PM Pager is on [place]  If 7PM-7AM, please contact night-coverage [email or web address]

## 2020-03-23 NOTE — Progress Notes (Signed)
RT placed pt on BiPAP.

## 2020-03-23 NOTE — ED Notes (Signed)
This RN spoke with pt regarding education of call bell use, pt verbalized understanding. Pt states " I forgot to use the call cell and tried to get up." Pt educated on fall risks and the importance of use of call bell pt verbalized understanding.

## 2020-03-23 NOTE — ED Notes (Signed)
Patient verbalizes understanding of discharge instructions. Opportunity for questioning and answers were provided. Armband removed by staff, pt discharged from ED via wheelchair with wife to go home. Unable to get vitals prior to d/c.

## 2020-03-23 NOTE — Procedures (Signed)
Patient Name: Matthew DUPRIEST Sr.  MRN: [id number]  Epilepsy Attending: Lora Havens  Referring Physician/Provider: Dr Zada Finders Date: 03/23/2020 Duration: 23.32 mins  Patient history: 59 yo M ESRD patient with new onset seizure. EEG to evaluate for seizure.  Level of alertness: Awake  AEDs during EEG study: None  Technical aspects: This EEG study was done with scalp electrodes positioned according to the 10-20 International system of electrode placement. Electrical activity was acquired at a sampling rate of 500Hz  and reviewed with a high frequency filter of 70Hz  and a low frequency filter of 1Hz . EEG data were recorded continuously and digitally stored.   Description: The posterior dominant rhythm consists of 7 Hz activity of moderate voltage (25-35 uV) seen predominantly in posterior head regions, symmetric and reactive to eye opening and eye closing. EEG showed continuous generalized 5 to 6 Hz theta slowing as well as intermittent 2-3Hz  generalized rhythmic delta slowing. Physiology photic driving was not seen during photic stimulation.  Hyperventilation was not performed.     ABNORMALITY - Continuous slow, generalized - Intermittent rhythmic slow, generalized - Background slow  IMPRESSION: This study is suggestive of mild diffuse encephalopathy, nonspecific etiology. No seizures or epileptiform discharges were seen throughout the recording.  Matthew Rowe Barbra Sarks

## 2020-03-23 NOTE — Progress Notes (Signed)
EEG Completed; Results Pending  

## 2020-03-23 NOTE — Progress Notes (Signed)
Neurology Progress Note   S:// Seen and examined.  No seizures overnight. Reports having had shaking jerking movements few months ago for which she was seen by neurology here and thought to have side effects of gabapentin and gabapentin was discontinued at that time.   Also possibly on Ultram that can lower seizure threshold.  O:// Current vital signs: BP (!) 129/102   Pulse 64   Temp 98.5 F (36.9 C) (Oral)   Resp (!) 24   Ht 5' 5.5" (1.664 m)   Wt 97 kg   SpO2 98%   BMI 35.04 kg/m  Vital signs in last 24 hours: Temp:  [98.4 F (36.9 C)-98.5 F (36.9 C)] 98.5 F (36.9 C) (07/13 2245) Pulse Rate:  [38-65] 64 (07/14 1302) Resp:  [17-33] 24 (07/14 1302) BP: (103-138)/(65-105) 129/102 (07/14 1302) SpO2:  [65 %-100 %] 98 % (07/14 1302) Weight:  [97 kg-98.4 kg] 97 kg (07/13 1659) Neurological exam Awake alert oriented x3 Speech is nondysarthric No evidence of aphasia Cranial nerves: Pupils equal round react light, extraocular movement active visual field full, face symmetric, tongue and palate midline. Motor exam: All 4 extremities 5/5 in strength with mild asterixis when he has his arms outstretched which is barely noticeable unless he keeps his arms outstretched for a while.  He has no frank weakness in the lower extremities but almost has akathisia in both his legs. Sensory exam intact to touch all over DTRs: 2+ all over with downgoing toes and 2-3 beats of clonus bilaterally. Coordination: No ataxia  Medications  Current Facility-Administered Medications:  .  acetaminophen (TYLENOL) tablet 650 mg, 650 mg, Oral, Q4H PRN, 650 mg at 03/23/20 1150 **OR** acetaminophen (TYLENOL) suppository 650 mg, 650 mg, Rectal, Q4H PRN, Posey Pronto, Vishal R, MD .  atorvastatin (LIPITOR) tablet 80 mg, 80 mg, Oral, Daily, Zada Finders R, MD, 80 mg at 03/23/20 1151 .  carvedilol (COREG) tablet 25 mg, 25 mg, Oral, QHS, Zada Finders R, MD, 25 mg at 03/23/20 0015 .  carvedilol (COREG) tablet 25 mg,  25 mg, Oral, Once per day on Sun Mon Wed Fri, Patel, Vishal R, MD, 25 mg at 03/23/20 1156 .  clopidogrel (PLAVIX) tablet 75 mg, 75 mg, Oral, Daily, Zada Finders R, MD, 75 mg at 03/23/20 1151 .  heparin injection 5,000 Units, 5,000 Units, Subcutaneous, Q8H, Patel, Vishal R, MD .  insulin aspart (novoLOG) injection 0-9 Units, 0-9 Units, Subcutaneous, TID WC, Patel, Vishal R, MD .  isosorbide mononitrate (IMDUR) 24 hr tablet 60 mg, 60 mg, Oral, BID, Zada Finders R, MD, 60 mg at 03/23/20 1150 .  LORazepam (ATIVAN) injection 1-2 mg, 1-2 mg, Intravenous, Q2H PRN, Zada Finders R, MD .  ranolazine (RANEXA) 12 hr tablet 1,000 mg, 1,000 mg, Oral, BID, Zada Finders R, MD, 1,000 mg at 03/23/20 1152 .  senna-docusate (Senokot-S) tablet 1 tablet, 1 tablet, Oral, QHS PRN, Lenore Cordia, MD  Current Outpatient Medications:  .  acetaminophen (TYLENOL) 500 MG tablet, Take 500 mg by mouth 2 (two) times daily as needed (for pain or headaches). , Disp: , Rfl:  .  allopurinol (ZYLOPRIM) 100 MG tablet, Take 50 mg by mouth 2 (two) times daily. , Disp: , Rfl:  .  atorvastatin (LIPITOR) 80 MG tablet, Take 1 tablet (80 mg total) by mouth daily., Disp: 90 tablet, Rfl: 1 .  Calcium Acetate 667 MG TABS, Take 667-1,334 mg by mouth See admin instructions. Takes 1 tablet in the morning, and take 2 tablets in the evening.,  Disp: , Rfl:  .  carvedilol (COREG) 25 MG tablet, Take 1 tablet (25 mg total) by mouth 2 (two) times daily with a meal. Skip AM dose on dialysis days (Patient taking differently: Take 25 mg by mouth 2 (two) times daily with a meal. ), Disp: 180 tablet, Rfl: 3 .  Cholecalciferol 2000 units TABS, Take 2,000 Units by mouth daily. , Disp: , Rfl:  .  clopidogrel (PLAVIX) 75 MG tablet, TAKE ONE TABLET BY MOUTH ONCE DAILY WITH BREAKFAST (Patient taking differently: Take 75 mg by mouth daily. ), Disp: 90 tablet, Rfl: 1 .  diphenhydrAMINE (BENADRYL) 25 MG tablet, Take 25 mg by mouth See admin instructions. Takes on  dialysis days for itching (M,W,F)., Disp: , Rfl:  .  insulin aspart protamine- aspart (NOVOLOG MIX 70/30) (70-30) 100 UNIT/ML injection, Inject 20 Units into the skin as needed (if blood sugar is over 180). , Disp: , Rfl:  .  isosorbide mononitrate (IMDUR) 60 MG 24 hr tablet, Take 1 tablet (60 mg total) by mouth 2 (two) times daily., Disp: 60 tablet, Rfl: 0 .  lidocaine-prilocaine (EMLA) cream, Apply 1 application topically 3 (three) times a week., Disp: , Rfl:  .  Multiple Vitamin (MULTIVITAMIN WITH MINERALS) TABS tablet, Take 1 tablet by mouth daily., Disp: , Rfl:  .  nitroGLYCERIN (NITROSTAT) 0.4 MG SL tablet, Place 1 tablet (0.4 mg total) under the tongue every 5 (five) minutes as needed for chest pain., Disp: 25 tablet, Rfl: 5 .  pantoprazole (PROTONIX) 40 MG tablet, Take 1 tablet (40 mg total) by mouth daily., Disp: 90 tablet, Rfl: 3 .  ranolazine (RANEXA) 500 MG 12 hr tablet, Take 1 tablet (500 mg total) by mouth 2 (two) times daily. (Patient taking differently: Take 1,000 mg by mouth 2 (two) times daily. ), Disp: 180 tablet, Rfl: 3 .  Semaglutide (OZEMPIC, 0.25 OR 0.5 MG/DOSE, ), Inject 0.25 mg into the skin every Saturday., Disp: , Rfl:  .  vitamin B-12 (CYANOCOBALAMIN) 1000 MCG tablet, Take 1,000 mcg by mouth See admin instructions. On M,W,F - Dialysis days., Disp: , Rfl:  .  zolpidem (AMBIEN) 10 MG tablet, Take 10 mg by mouth at bedtime as needed for sleep. , Disp: , Rfl:  .  gabapentin (NEURONTIN) 300 MG capsule, Take 1 capsule (300 mg total) by mouth daily. (Patient not taking: Reported on 03/22/2020), Disp: 30 capsule, Rfl: 0 .  glucose blood (ACCU-CHEK AVIVA PLUS) test strip, 1 each by Other route in the morning, at noon, and at bedtime., Disp: , Rfl:  .  Misc. Devices MISC, by Does not apply route. C-PAP, Disp: , Rfl:  Labs CBC    Component Value Date/Time   WBC 7.9 03/23/2020 1230   RBC 3.94 (L) 03/23/2020 1230   HGB 11.6 (L) 03/23/2020 1230   HCT 35.6 (L) 03/23/2020 1230    PLT 128 (L) 03/23/2020 1230   MCV 90.4 03/23/2020 1230   MCV 85.3 11/24/2014 1419   MCH 29.4 03/23/2020 1230   MCHC 32.6 03/23/2020 1230   RDW 17.9 (H) 03/23/2020 1230   LYMPHSABS 1.3 03/23/2020 1230   MONOABS 1.0 03/23/2020 1230   EOSABS 0.1 03/23/2020 1230   BASOSABS 0.0 03/23/2020 1230    CMP     Component Value Date/Time   NA 129 (L) 03/23/2020 1230   NA 140 03/29/2017 1352   K 4.4 03/23/2020 1230   CL 91 (L) 03/23/2020 1230   CO2 24 03/23/2020 1230   GLUCOSE 95 03/23/2020 1230  BUN 38 (H) 03/23/2020 1230   BUN 35 (H) 03/29/2017 1352   CREATININE 5.43 (H) 03/23/2020 1230   CREATININE 2.47 (H) 04/19/2016 1144   CALCIUM 8.8 (L) 03/23/2020 1230   PROT 6.9 03/23/2020 1230   ALBUMIN 3.7 03/23/2020 1230   AST 33 03/23/2020 1230   ALT 29 03/23/2020 1230   ALKPHOS 126 03/23/2020 1230   BILITOT 2.1 (H) 03/23/2020 1230   GFRNONAA 11 (L) 03/23/2020 1230   GFRAA 12 (L) 03/23/2020 1230    Imaging I have reviewed images in epic and the results pertinent to this consultation are: MRI negative for acute process  EEG with generalized slowing  Assessment: 59 year old with generalized tonic-clonic seizure, yesterday as well as a prior episode of 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 Yesterday seizure activity afternoon after dialysis. Has had more of arm and leg jerkiness even after stopping gabapentin. Differentials include dialysis disequilibrium syndrome versus seizure. Due to multiplicity of events, I would start him on an antiepileptic-Depakote would be best as it would be also beneficial for the myoclonus.  Needs further work-up with movement disorder. Other differentials to consider-restless leg syndrome.  Recommendations: Load with Depakote 1000 mg x 1. Start on Depakote 500 twice daily Maintain seizure precautions No driving unless 6 months seizure-free per [facility] law Follow-up with [facility] Plan discussed and relayed to Dr. Shelton Silvas primary  hospitalist And also discussed with the wife and patient. Please call neurology with questions. -- Amie Portland, MD [facility] Pager: [phone] If 7pm to 7am, please call on call as listed on AMION.

## 2020-03-23 NOTE — ED Notes (Signed)
Please call the wife Londyn Wotton [phone] for an update she stated she called last night but never got a return call

## 2020-03-23 NOTE — ED Notes (Signed)
Breakfast Ordered--Matthew Rowe  

## 2020-03-23 NOTE — Progress Notes (Signed)
Matthew Rowe is a 59 Y/O male with ESRD on hemodialysis T,Th,S at [place], [place] (Recently transferred from [facility]). PMH: Acute on chronic combined systolic and diastolic HF, Ischemic cardiomyopathy, CAD,  HTN, DMT2, gout, GERD, OSA, AOCD, SHPT. He presented to ED 03/22/2020 with first time witnessed seizure at home. Recent admission 02/03/2020 with myoclonic symptoms related to high dose gabapentin use. He has used tramadol since. Neurology has been consulted.  No further seizure activity since admission. CT of head without acute infarct or hemorrhage. EEG without evidence of seizure activity. He has not been started on AEDs as neurologist believes this may be related to tramadol use. Last HD 03/22/2020. Labs unremarkable for HD patient on admission. He has no immediate needs for HD today. He is asking if his time can be decreased as he has issues with feeling "washed out" after 3.5 hours.   He has been admitted as observation patient for 1st time seizure activity. We will manage hemodialysis and consult formally if status upgraded to inpatient.   HD orders: Obtaining records from [place], [employer] [phone]

## 2020-03-23 NOTE — ED Notes (Signed)
Pt's CBG result was 95. Informed Luellen Pucker - RN.

## 2020-04-08 IMAGING — DX DG CHEST 1V PORT
1 series · 1 of 1 positions shown · non-contrast
Comparison: Portable film earlier in the day.

CLINICAL DATA: Chest pain.

EXAM:
PORTABLE CHEST 1 VIEW

[chest]
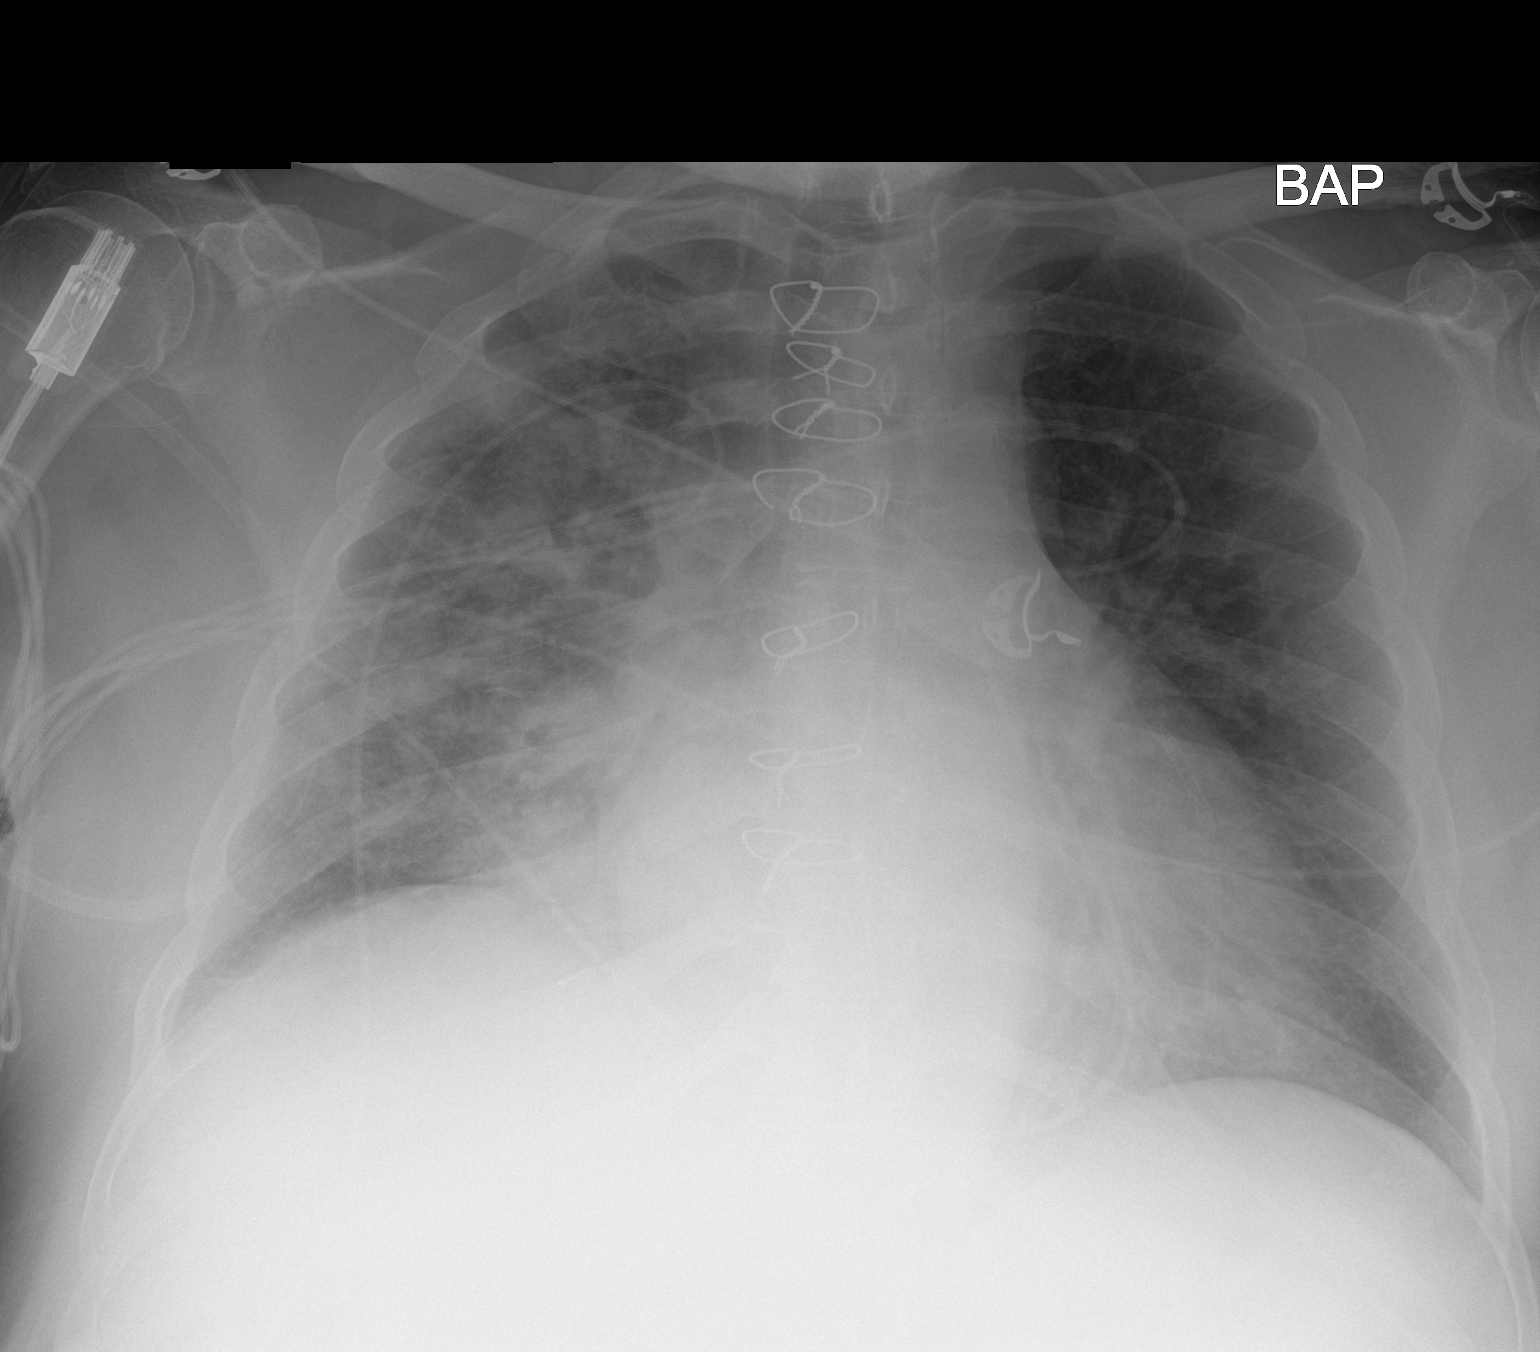

[1 of 1 positions shown; findings below may reference images not displayed]

FINDINGS: Cardiomegaly is redemonstrated. BILATERAL pulmonary opacities, worse
on the RIGHT are redemonstrated, and slightly worse. No effusion or
pneumothorax.
IMPRESSION: Worsening aeration.

## 2020-05-09 ENCOUNTER — Other Ambulatory Visit: Payer: Self-pay

## 2020-05-09 ENCOUNTER — Encounter: Payer: Self-pay | Admitting: Neurology

## 2020-05-09 ENCOUNTER — Ambulatory Visit: Payer: Medicare HMO | Admitting: Neurology

## 2020-05-09 VITALS — BP 132/76 | HR 72 | Ht 65.5 in | Wt 224.0 lb

## 2020-05-09 DIAGNOSIS — R569 Unspecified convulsions: Secondary | ICD-10-CM

## 2020-05-09 NOTE — Patient Instructions (Addendum)
I am glad to hear that you have been stable and feeling better.  Please continue with the seizure medicine, Depakote, 500 mg twice daily, your prescription has been updated by your primary care physician.  As discussed, I would like to make a referral to a seizure/epilepsy specialist, I will make a referral to Dr. Delice Lesch since you have a more complex history.  We will check blood work today and call you with the results.  I am requesting that you follow-up with your seizure specialist from now on, let me know if you have not heard about making an appointment in the next couple of weeks with Dr. Delice Lesch.  Please remember, common seizure triggers are: Sleep deprivation, dehydration, overheating, stress, hypoglycemia or skipping meals, certain medications or excessive alcohol use, especially stopping alcohol abruptly if you have had heavy alcohol use before (aka alcohol withdrawal seizure). If you have a prolonged seizure over 2-5 minutes or back to back seizures, call or have someone call 911 or take you to the nearest emergency room. You cannot drive a car or operate any other machinery or vehicle within 6 months of a seizure. Please do not swim alone or take a tub bath for safety. Do not climb on ladders or be at heights alone. Do not cook with large quantities of boiling water or oil for safety. Please ensure the water temperature at home is not too high. When carrying or caring for small children and infants, make sure you sit down when holding the child are feeding the child or changing them to minimize risk for injury to the child are to you if you were to have a seizure.   Take your medicine for seizure prevention regularly and do not skip doses or stop medication abruptly and tone are told to do so by your healthcare provider. Try to get a refill on your antiepileptic medication ahead of time, so you are not at risk of running out. If you run out of the seizure medication and do not have a refill at hand  you may be at risk of medication withdrawal seizures. Avoid taking Wellbutrin, narcotic pain medications and tramadol, as they can lower seizure threshold.  As per [facility] statutes, patients with seizures and epilepsy are not allowed to drive until they have been seizure-free for at least 6 months. This also applies to driving and using heavy equipment or power tools, dangerous equipment.

## 2020-05-09 NOTE — Progress Notes (Signed)
Subjective:    Patient ID: Matthew Rowe. is a 59 y.o. male.  HPI     Star Age, MD, PhD [facility] [address] [place], [place] [address]  [name] is a 59 year old right-handed gentleman with an underlying complex medical history of heart disease, hypertension, diabetes, ESRD on HD, status post cardiac stents and status post CABG single-vessel in 2008, and recent admission in July 2021 for seizure activity, who is referred from the hospital for evaluation of his seizure.  The patient is accompanied by his wife today who provides most of his history of the event that occurred on 03/22/2020.  He had had dialysis that day.  She reports that he was at home, resting, he was in bed.  He started shaking in his upper body and arms.  The whole episode lasted about 2 minutes, he was unresponsive and she called EMS.  He had no bowel or bladder incontinence but did bite his tongue.   He was hospitalized from 03/22/2020 through 03/23/2020 after a seizure post hemodialysis, witnessed by his wife. I reviewed the hospital records. Brain MRI without contrast from 03/23/2020 showed: IMPRESSION: 1. Motion degraded study without acute intracranial finding. No focal cortical abnormality to explain seizure. 2. Left scalp contusion. 3. Chronic white matter disease.  Head CT without contrast on 03/22/2020 showed: IMPRESSION: 1. Chronic small vessel ischemic changes in the white matter. No acute intracranial process.   He had an EEG on 03/23/2020 and I reviewed the results: IMPRESSION: This study is suggestive of mild diffuse encephalopathy, nonspecific etiology. No seizures or epileptiform discharges were seen throughout the recording.   He was advised to discontinue tramadol. He was taken off of gabapentin and it was subsequently restarted. He was started on Depakote. He was discharged on Depakote.  He reports taking the Depakote twice daily, reports no side  effects.  He feels at baseline.  I have evaluated him over 5 years ago for his obstructive sleep apnea. He had a split-night sleep study which indicated obstructive sleep apnea, he was lost to follow-up, canceled several appointments in 2016 and one appointment in 2017.   He had a split-night sleep study on 03/07/2015.   He reports compliance with his CPAP machine, his sleep apnea and compliance is followed by the [place].  He also sees an eye doctor through the [place].  He is currently not driving and was told not to drive when he was hospitalized.  He reports that he was on gabapentin 300 mg 3 times daily and it was recently stopped, prior to the seizure recurrence.  He does not have a family history of epilepsy or personal history of febrile seizures as a child.  Previously:  02/17/2015: He reports a prior diagnosis of obstructive sleep apnea. Prior sleep test results are not available for my review. He has been on CPAP therapy for the past 7 years or so. He has not had a follow-up for sleep apnea treatment of the past 5 years or so. Unfortunately his CPAP machine broke about 3 months ago and he had the same machine for about 7 years. I reviewed his recent hospital records from his admission from 12/31/2014. He was discharged on 01/02/2015. His troponin was negative 3. 2-D echocardiogram from 01/02/2015 showed: Normal left ventricular cavity size. Mild LVH, EF estimated at 50-55%. He had grade 1 diastolic dysfunction, very mild aortic stenosis, mild aortic regurgitation, mild mitral regurgitation.  He went back to the ER  on 02/15/15 with CP and cough and was diagnosed with a URI and given a Rx for tussionex, but has not filled the Rx yet d/t cost. He does not keep a regular sleep and wake schedule. He was diagnosed with sleep apnea when he was still in [place]. He moved down here about 5 years ago. He has 4 grown children (from his previous 2 marriages), 3 are in [place] and 1 in [place] in the [place]. He is  not aware of any family history of OSA. When he was on CPAP therapy he felt better. He reports compliance with treatment. He had a nasal mask and before then he had a nasal pillows interface. He is currently congested and has some difficulty breathing at night because of that. He snores and has apneic pauses in his sleep. He wakes up several times in the middle of the night and does not feel rested. His Epworth sleepiness score today is 20 out of 24. He reports occasional morning headaches and significant nocturia, on average 3 times per night. His blood sugar this morning was around 160. He reports weight gain in the past few years. He smoked cigarettes a little when he was a teenager but was never smoker. He does not drink alcohol. He drinks caffeine in the form of sodas but not every day.  His Past Medical History Is Significant For: Past Medical History:  Diagnosis Date  . Acute on chronic combined systolic and diastolic CHF (congestive heart failure) ([facility]) 10/15/2017  . Chronic anemia   . Chronic combined systolic and diastolic CHF (congestive heart failure) ([facility])   . Chronic stable angina ([facility])   . CKD (chronic kidney disease), stage IV ([facility])   . Coronary artery disease    a. INF MI 1999: PCI of RCA;  b.  extensive stenting of LAD;  c. s/p CABG with RIMA->RCA in 2006;  d. Stable, Low risk MV 05/2012; e. 09/2012 Cath: stable anatomy->med Rx. f. Abnl nuc 11/2013 -> med rx; g. 11/2013 Cath/PCI: s/p DES to RCA. h. Abnormal nuc 11/2016, pt declined cath.   . DM2 (diabetes mellitus, type 2) ([facility])    Type II  . Dyslipidemia   . GERD (gastroesophageal reflux disease)   . History of gout   . Hypertension   . Hypertensive heart disease   . Ischemic cardiomyopathy   . Mild aortic stenosis   . Myocardial infarction [facility]) 1999; 2002; 2003; 2006; ~ 2008  . OSA on CPAP    wears CPAP  . Polycystic kidney disease     His Past Surgical History Is Significant For: Past Surgical History:  Procedure  Laterality Date  . AV FISTULA PLACEMENT Left 05/29/2018   Procedure: ARTERIOVENOUS (AV) FISTULA CREATION  LEFT ARM.;  Surgeon: Serafina Mitchell, MD;  Location: [facility];  Service: Vascular;  Laterality: Left;  . BASCILIC VEIN TRANSPOSITION Left 07/24/2018   Procedure: SECOND STAGE BASCILIC VEIN TRANSPOSITION LEFT ARM;  Surgeon: Serafina Mitchell, MD;  Location: [facility];  Service: Vascular;  Laterality: Left;  . CARDIAC CATHETERIZATION  2012  . CARDIAC CATHETERIZATION  2013  . CARDIAC CATHETERIZATION  2014   LHC (1/14): total occlusion of distal LAD, diffuse disease in ramus, 70% proximal LCx, 50% proximal and mid RCA, 50% distal RCA, patent RIMA-RCA.  Marland Kitchen CORONARY ANGIOPLASTY WITH STENT PLACEMENT  1999 / 2002 / 2004 / 2006?  . CORONARY ARTERY BYPASS GRAFT  2006   CABG X?; in [place]  . CYSTECTOMY  1990's   off  face  . HERNIA REPAIR     inguinal hernia  . LEFT HEART CATH AND CORONARY ANGIOGRAPHY N/A 07/13/2019   Procedure: LEFT HEART CATH AND CORONARY ANGIOGRAPHY;  Surgeon: Nelva Bush, MD;  Location: [facility] CV LAB;  Service: Cardiovascular;  Laterality: N/A;  . LEFT HEART CATHETERIZATION WITH CORONARY/GRAFT ANGIOGRAM N/A 09/16/2012   Procedure: LEFT HEART CATHETERIZATION WITH [name];  Surgeon: [name], MD;  Location: [facility] CATH LAB;  Service: Cardiovascular;  Laterality: N/A;  . LEFT HEART CATHETERIZATION WITH CORONARY/GRAFT ANGIOGRAM N/A 12/02/2013   Procedure: LEFT HEART CATHETERIZATION WITH [name];  Surgeon: [name], MD;  Location: [facility] CATH LAB;  Service: Cardiovascular;  Laterality: N/A;  . PERCUTANEOUS CORONARY STENT INTERVENTION (PCI-S) N/A 12/03/2013   Procedure: PERCUTANEOUS CORONARY STENT INTERVENTION (PCI-S);  Surgeon: [name], MD;  Location: [facility] CATH LAB;  Service: Cardiovascular;  Laterality: N/A;  . RIGHT/LEFT HEART CATH AND CORONARY/GRAFT ANGIOGRAPHY N/A 06/23/2018   Procedure: RIGHT/LEFT HEART CATH AND CORONARY/GRAFT  ANGIOGRAPHY;  Surgeon: [name], MD;  Location: [facility] CV LAB;  Service: Cardiovascular;  Laterality: N/A;    His Family History Is Significant For: Family History  Problem Relation Age of Onset  . Lung cancer Mother   . Anemia Mother   . Polycystic kidney disease Mother   . Diabetes Father   . Heart attack Father 2       Died suddenly  . Heart failure Father   . Hyperlipidemia Father   . Hypertension Father   . Kidney failure Sister 5       Died  . Heart attack Sister   . Hypertension Sister   . Diabetes Sister   . Diabetes Brother   . Polycystic kidney disease Brother   . Diabetes Sister     His Social History Is Significant For: Social History   Socioeconomic History  . Marital status: Married    Spouse name: [name]  . Number of children: 5  . Years of education: Not on file  . Highest education level: Not on file  Occupational History  . Occupation: retired    Comment: Chartered loss adjuster  Tobacco Use  . Smoking status: Former Smoker    Types: Cigarettes  . Smokeless tobacco: Never Used  . Tobacco comment: smoked some as a teenager (high school) not even a pack a week  Vaping Use  . Vaping Use: Never used  Substance and Sexual Activity  . Alcohol use: No    Alcohol/week: 0.0 standard drinks    Comment: 09/15/2012 "drank a little when I was young"  . Drug use: No  . Sexual activity: Yes  Other Topics Concern  . Not on file  Social History Narrative   Married and lives with wife in [place]. On disability.    Consumes about 2 Coke Zeros a day    Social Determinants of Radio broadcast assistant Strain:   . Difficulty of Paying Living Expenses: Not on file  Food Insecurity:   . Worried About Charity fundraiser in the Last Year: Not on file  . Ran Out of Food in the Last Year: Not on file  Transportation Needs:   . Lack of Transportation (Medical): Not on file  . Lack of Transportation (Non-Medical): Not on file  Physical Activity:   . Days  of Exercise per Week: Not on file  . Minutes of Exercise per Session: Not on file  Stress:   . Feeling of Stress : Not on file  Social Connections:   . Frequency of Communication with Friends and Family: Not on file  . Frequency of Social Gatherings with Friends and Family: Not on file  . Attends Religious Services: Not on file  . Active Member of Clubs or Organizations: Not on file  . Attends Archivist Meetings: Not on file  . Marital Status: Not on file    His Allergies Are:  Allergies  Allergen Reactions  . Cleocin [Clindamycin Hcl] Anaphylaxis and Swelling  . Glimepiride [Amaryl] Other (See Comments)    Elevates liver function   . Clarithromycin Itching and Other (See Comments)    "Biaxin" Eyes itch and burn  . Colchicine Other (See Comments)    Affected kidneys   . Ciprocin-Fluocin-Procin [Fluocinolone Acetonide] Rash  :   His Current Medications Are:  Outpatient Encounter Medications as of 05/09/2020  Medication Sig  . acetaminophen (TYLENOL) 500 MG tablet Take 500 mg by mouth 2 (two) times daily as needed (for pain or headaches).   Marland Kitchen allopurinol (ZYLOPRIM) 100 MG tablet Take 50 mg by mouth 2 (two) times daily.   . carvedilol (COREG) 25 MG tablet Take 1 tablet (25 mg total) by mouth 2 (two) times daily with a meal. Skip AM dose on dialysis days (Patient taking differently: Take 25 mg by mouth 2 (two) times daily with a meal. )  . diphenhydrAMINE (BENADRYL) 25 MG tablet Take 25 mg by mouth See admin instructions. Takes on dialysis days for itching (M,W,F).  Marland Kitchen divalproex (DEPAKOTE) 500 MG DR tablet Take 1 tablet (500 mg total) by mouth 2 (two) times daily.  Marland Kitchen glucose blood (ACCU-CHEK AVIVA PLUS) test strip 1 each by Other route in the morning, at noon, and at bedtime.  . insulin aspart protamine- aspart (NOVOLOG MIX 70/30) (70-30) 100 UNIT/ML injection Inject 20 Units into the skin as needed (if blood sugar is over 180).   . isosorbide mononitrate (IMDUR) 60 MG 24  hr tablet Take 1 tablet (60 mg total) by mouth 2 (two) times daily.  Marland Kitchen lidocaine-prilocaine (EMLA) cream Apply 1 application topically 3 (three) times a week.  . Misc. Devices MISC by Does not apply route. C-PAP  . Multiple Vitamin (MULTIVITAMIN WITH MINERALS) TABS tablet Take 1 tablet by mouth daily.  . pantoprazole (PROTONIX) 40 MG tablet Take 1 tablet (40 mg total) by mouth daily.  . ranolazine (RANEXA) 500 MG 12 hr tablet Take 1 tablet (500 mg total) by mouth 2 (two) times daily. (Patient taking differently: Take 1,000 mg by mouth 2 (two) times daily. )  . Semaglutide (OZEMPIC, 0.25 OR 0.5 MG/DOSE, [place]) Inject 0.25 mg into the skin every Saturday.  Marland Kitchen atorvastatin (LIPITOR) 80 MG tablet Take 1 tablet (80 mg total) by mouth daily. (Patient not taking: Reported on 05/09/2020)  . zolpidem (AMBIEN) 10 MG tablet Take 10 mg by mouth at bedtime as needed for sleep.  (Patient not taking: Reported on 05/09/2020)  . [DISCONTINUED] Calcium Acetate 667 MG TABS Take 667-1,334 mg by mouth See admin instructions. Takes 1 tablet in the morning, and take 2 tablets in the evening. (Patient not taking: Reported on 05/09/2020)  . [DISCONTINUED] Cholecalciferol 2000 units TABS Take 2,000 Units by mouth daily.  (Patient not taking: Reported on 05/09/2020)  . [DISCONTINUED] clopidogrel (PLAVIX) 75 MG tablet TAKE ONE TABLET BY MOUTH ONCE DAILY WITH BREAKFAST (Patient not taking: Reported on 05/09/2020)  . [DISCONTINUED] nitroGLYCERIN (NITROSTAT) 0.4 MG SL tablet Place 1 tablet (0.4 mg total) under the tongue every 5 (  five) minutes as needed for chest pain. (Patient not taking: Reported on 05/09/2020)  . [DISCONTINUED] vitamin B-12 (CYANOCOBALAMIN) 1000 MCG tablet Take 1,000 mcg by mouth See admin instructions. On M,W,F - Dialysis days. (Patient not taking: Reported on 05/09/2020)   No facility-administered encounter medications on file as of 05/09/2020.  :  Review of Systems:  Out of a complete 14 point review of systems, all  are reviewed and negative with the exception of these symptoms as listed below:  Review of Systems  Neurological:       Here for consult on seizure like activity.  Wife reports on 03/22/2020 pt had a seizure like event while sleeping. She sts the pt upper extremities were shaking and his eyes rolled back. She sts the event was brief. He was started on Depakote 500 mg bid in the ED and no events since. He does report he was taken off gabapentin 300 mg tid 3 weeks prior to seizure. Pt sts he was on this med for 10+ year and stopped at one time, no tapering off was completed.      Objective:  Neurological Exam  Physical Exam Physical Examination:   Vitals:   05/09/20 1042  BP: 132/76  Pulse: 72  SpO2: 98%   General Examination: The patient is a very pleasant 59 y.o. male in no acute distress. He appears well-developed and well-nourished and well groomed.   HEENT: Normocephalic, atraumatic, pupils are equal, round and reactive to light and accommodation. Funduscopic exam is a little difficult, he has evidence of bilateral cataracts.  Extraocular tracking is well preserved, no nystagmus noted.  Hearing is grossly intact.  Face is symmetric with normal facial animation.  Airway examination reveals mild to moderate mouth dryness, tongue protrudes centrally in palate elevates symmetrically, no evidence or remnants of tongue laceration.  No carotid bruits.  No lip, neck or jaw tremor.   Of tongue laceration normal with sharp disc margins noted. Extraocular tracking is good without limitation to gaze   Chest: Clear to auscultation without wheezing, rhonchi or crackles noted.  Heart: S1+S2+0, regular and normal without murmurs, rubs or gallops noted.   Abdomen: Soft, non-tender and non-distended with normal bowel sounds appreciated on auscultation.  Extremities: There is no pitting edema in the distal lower extremities bilaterally. Pedal pulses are intact.  AV fistula left upper arm.  Skin:  Warm and dry without trophic changes noted.  Musculoskeletal: exam reveals no obvious joint deformities, tenderness or joint swelling or erythema.   Neurologically:  Mental status: The patient is awake, alert and oriented in all 4 spheres. His immediate and remote memory, attention, language skills and fund of knowledge are appropriate. There is no evidence of aphasia, agnosia, apraxia or anomia. Speech is clear with normal prosody and enunciation. Thought process is linear. Mood is normal and affect is normal.  Cranial nerves II - XII are as described above under HEENT exam. In addition: shoulder shrug is normal with equal shoulder height noted. Motor exam: Normal bulk, strength and tone is noted. There is no drift, tremor or rebound. Romberg is negative. Reflexes are 2+ throughout. Babinski: Toes are flexor bilaterally. Fine motor skills and coordination: intact with normal finger taps, normal hand movements, normal rapid alternating patting, normal foot taps and normal foot agility.  Cerebellar testing: No dysmetria or intention tremor on finger to nose testing. Heel to shin is unremarkable bilaterally. There is no truncal or gait ataxia.  Sensory exam: intact to light touch in the upper and  lower extremities.  Gait, station and balance: He stands slowly, he has a rolling walker with seat, he can walk a little without it, he uses his walker without problems, turns without difficulty.   Assessment and Plan:   In summary, Akbar Sacra. is a very pleasant 59 y.o.-year old male with an underlying complex medical history of heart disease, hypertension, diabetes, ESRD on HD, status post cardiac stents and status post CABG single-vessel in 2008, and recent admission in July 2021 for seizure activity, who presents as a referral from the hospital for evaluation of a seizure event, from the description a tonic-clonic seizure event that was witnessed by his wife, lasting about 2 minutes.  He does not  have any history of subsequent or prior events, had recently been told that the had noted electrolyte disturbance and also had recently stopped his gabapentin.  He was also advised to discontinue tramadol when he was hospitalized and was placed on Depakote which he continues to take and is able to tolerate.  Given his complex history, end-stage renal disease on dialysis, I would favor that he follow with a epilepsy specialist for his seizure disorder.  Thankfully, his exam is good today and he is able to tolerate the Depakote ER generic twice daily.  He feels at baseline.  We will proceed with routine CMP testing today as well as a Depakote level.  He is followed for his sleep apnea by the [place].  He reports compliance with his CPAP machine.  He is advised to stay well-hydrated and well rested and we talked about seizure precautions including no driving for 6 months per [facility] statutes.  He is agreeable to the referral to a epilepsy specialist.  We will call with his blood test results.  They are advised to call our clinic if they have not heard about his referral and new patient appointment.  I have requested that he see Dr. Delice Lesch at [facility] neurology.  His wife reports that he recently had an updated prescription for his Depakote through his primary care physician, Dr. Coletta Memos. I answered all the questions today and the patient and his wife were in agreement with the above plan.   Star Age, MD, PhD

## 2020-05-10 ENCOUNTER — Telehealth: Payer: Self-pay

## 2020-05-10 ENCOUNTER — Encounter: Payer: Self-pay | Admitting: Neurology

## 2020-05-10 LAB — COMPREHENSIVE METABOLIC PANEL
ALT: 9 IU/L (ref 0–44)
AST: 17 IU/L (ref 0–40)
Albumin/Globulin Ratio: 1.7 (ref 1.2–2.2)
Albumin: 4 g/dL (ref 3.8–4.9)
Alkaline Phosphatase: 117 IU/L (ref 48–121)
BUN/Creatinine Ratio: 6 — ABNORMAL LOW (ref 10–24)
BUN: 28 mg/dL — ABNORMAL HIGH (ref 8–27)
Bilirubin Total: 0.8 mg/dL (ref 0.0–1.2)
CO2: 28 mmol/L (ref 20–29)
Calcium: 9.6 mg/dL (ref 8.6–10.2)
Chloride: 93 mmol/L — ABNORMAL LOW (ref 96–106)
Creatinine, Ser: 5.05 mg/dL — ABNORMAL HIGH (ref 0.76–1.27)
GFR calc Af Amer: 13 mL/min/{1.73_m2} — ABNORMAL LOW (ref >59)
GFR calc non Af Amer: 11 mL/min/{1.73_m2} — ABNORMAL LOW (ref >59)
Globulin, Total: 2.4 g/dL (ref 1.5–4.5)
Glucose: 97 mg/dL (ref 65–99)
Potassium: 4.6 mmol/L (ref 3.5–5.2)
Sodium: 135 mmol/L (ref 134–144)
Total Protein: 6.4 g/dL (ref 6.0–8.5)

## 2020-05-10 LAB — VALPROIC ACID LEVEL: Valproic Acid Lvl: 66 ug/mL (ref 50–100)

## 2020-05-10 NOTE — Telephone Encounter (Signed)
I reached out to the pt and advised of results. Pt verbalized understanding and had no questions/concerns.

## 2020-05-10 NOTE — Progress Notes (Signed)
Liver function looks good, kidney function in the stable range for him, and electrolytes look okay, Depakote level in the therapeutic range, we will continue with his medication as discussed, please update the patient or his wife.

## 2020-05-10 NOTE — Telephone Encounter (Signed)
-----   Message from Star Age, MD sent at 05/10/2020  7:52 AM EDT ----- Liver function looks good, kidney function in the stable range for him, and electrolytes look okay, Depakote level in the therapeutic range, we will continue with his medication as discussed, please update the patient or his wife.

## 2020-05-26 ENCOUNTER — Emergency Department (HOSPITAL_COMMUNITY)
Admission: EM | Admit: 2020-05-26 | Discharge: 2020-06-10 | Disposition: E | Payer: No Typology Code available for payment source | Attending: Emergency Medicine | Admitting: Emergency Medicine

## 2020-05-26 ENCOUNTER — Emergency Department (HOSPITAL_COMMUNITY): Payer: No Typology Code available for payment source

## 2020-05-26 DIAGNOSIS — N186 End stage renal disease: Secondary | ICD-10-CM | POA: Insufficient documentation

## 2020-05-26 DIAGNOSIS — I132 Hypertensive heart and chronic kidney disease with heart failure and with stage 5 chronic kidney disease, or end stage renal disease: Secondary | ICD-10-CM | POA: Diagnosis not present

## 2020-05-26 DIAGNOSIS — I5042 Chronic combined systolic (congestive) and diastolic (congestive) heart failure: Secondary | ICD-10-CM | POA: Diagnosis not present

## 2020-05-26 DIAGNOSIS — R140 Abdominal distension (gaseous): Secondary | ICD-10-CM | POA: Diagnosis not present

## 2020-05-26 DIAGNOSIS — Z794 Long term (current) use of insulin: Secondary | ICD-10-CM | POA: Insufficient documentation

## 2020-05-26 DIAGNOSIS — R531 Weakness: Secondary | ICD-10-CM | POA: Diagnosis present

## 2020-05-26 DIAGNOSIS — I469 Cardiac arrest, cause unspecified: Secondary | ICD-10-CM | POA: Diagnosis not present

## 2020-05-26 DIAGNOSIS — Z951 Presence of aortocoronary bypass graft: Secondary | ICD-10-CM | POA: Insufficient documentation

## 2020-05-26 DIAGNOSIS — Z79899 Other long term (current) drug therapy: Secondary | ICD-10-CM | POA: Insufficient documentation

## 2020-05-26 DIAGNOSIS — Z87891 Personal history of nicotine dependence: Secondary | ICD-10-CM | POA: Diagnosis not present

## 2020-05-26 DIAGNOSIS — E119 Type 2 diabetes mellitus without complications: Secondary | ICD-10-CM | POA: Insufficient documentation

## 2020-05-26 DIAGNOSIS — I2510 Atherosclerotic heart disease of native coronary artery without angina pectoris: Secondary | ICD-10-CM | POA: Insufficient documentation

## 2020-05-26 DIAGNOSIS — Z992 Dependence on renal dialysis: Secondary | ICD-10-CM | POA: Diagnosis not present

## 2020-05-26 LAB — CBC WITH DIFFERENTIAL/PLATELET
Abs Immature Granulocytes: 0.03 10*3/uL (ref 0.00–0.07)
Basophils Absolute: 0 10*3/uL (ref 0.0–0.1)
Basophils Relative: 0 %
Eosinophils Absolute: 0 10*3/uL (ref 0.0–0.5)
Eosinophils Relative: 1 %
HCT: 35.5 % — ABNORMAL LOW (ref 39.0–52.0)
Hemoglobin: 11.4 g/dL — ABNORMAL LOW (ref 13.0–17.0)
Immature Granulocytes: 0 %
Lymphocytes Relative: 16 %
Lymphs Abs: 1.1 10*3/uL (ref 0.7–4.0)
MCH: 30.9 pg (ref 26.0–34.0)
MCHC: 32.1 g/dL (ref 30.0–36.0)
MCV: 96.2 fL (ref 80.0–100.0)
Monocytes Absolute: 0.9 10*3/uL (ref 0.1–1.0)
Monocytes Relative: 13 %
Neutro Abs: 4.8 10*3/uL (ref 1.7–7.7)
Neutrophils Relative %: 70 %
Platelets: 97 10*3/uL — ABNORMAL LOW (ref 150–400)
RBC: 3.69 MIL/uL — ABNORMAL LOW (ref 4.22–5.81)
RDW: 17.7 % — ABNORMAL HIGH (ref 11.5–15.5)
WBC: 6.8 10*3/uL (ref 4.0–10.5)
nRBC: 0 % (ref 0.0–0.2)

## 2020-05-26 LAB — LIPASE, BLOOD: Lipase: 36 U/L (ref 11–51)

## 2020-05-26 LAB — COMPREHENSIVE METABOLIC PANEL
ALT: 12 U/L (ref 0–44)
AST: 23 U/L (ref 15–41)
Albumin: 3.6 g/dL (ref 3.5–5.0)
Alkaline Phosphatase: 96 U/L (ref 38–126)
Anion gap: 13 (ref 5–15)
BUN: 12 mg/dL (ref 6–20)
CO2: 26 mmol/L (ref 22–32)
Calcium: 9.1 mg/dL (ref 8.9–10.3)
Chloride: 95 mmol/L — ABNORMAL LOW (ref 98–111)
Creatinine, Ser: 3.17 mg/dL — ABNORMAL HIGH (ref 0.61–1.24)
GFR calc Af Amer: 23 mL/min — ABNORMAL LOW (ref >60)
GFR calc non Af Amer: 20 mL/min — ABNORMAL LOW (ref >60)
Glucose, Bld: 175 mg/dL — ABNORMAL HIGH (ref 70–99)
Potassium: 4.2 mmol/L (ref 3.5–5.1)
Sodium: 134 mmol/L — ABNORMAL LOW (ref 135–145)
Total Bilirubin: 1.3 mg/dL — ABNORMAL HIGH (ref 0.3–1.2)
Total Protein: 7.1 g/dL (ref 6.5–8.1)

## 2020-05-26 LAB — TROPONIN I (HIGH SENSITIVITY): Troponin I (High Sensitivity): 16 ng/L (ref <18)

## 2020-05-26 LAB — CBG MONITORING, ED: Glucose-Capillary: 171 mg/dL — ABNORMAL HIGH (ref 70–99)

## 2020-05-26 MED ORDER — EPINEPHRINE HCL 5 MG/250ML IV SOLN IN NS
0.5000 ug/min | INTRAVENOUS | Status: DC
  Administered 2020-05-26: 16:00:00 2.667 ug/min via INTRAVENOUS

## 2020-05-26 MED ORDER — ONDANSETRON HCL 4 MG/2ML IJ SOLN
4.0000 mg | Freq: Once | INTRAMUSCULAR | Status: AC
  Administered 2020-05-26: 4 mg via INTRAVENOUS
  Filled 2020-05-26: qty 2

## 2020-05-26 MED ORDER — LORAZEPAM 2 MG/ML IJ SOLN
INTRAMUSCULAR | Status: AC
  Filled 2020-05-26: qty 1

## 2020-05-26 MED ORDER — LORAZEPAM 2 MG/ML IJ SOLN
INTRAMUSCULAR | Status: AC
  Filled 2020-05-26: qty 2

## 2020-05-26 MED ORDER — EPINEPHRINE 1 MG/10ML IJ SOSY
PREFILLED_SYRINGE | INTRAMUSCULAR | Status: DC | PRN
  Administered 2020-05-26 (×5): 1 mg via INTRAVENOUS

## 2020-05-26 MED ORDER — LEVETIRACETAM IN NACL 1500 MG/100ML IV SOLN
1500.0000 mg | Freq: Once | INTRAVENOUS | Status: AC
  Administered 2020-05-26: 1500 mg via INTRAVENOUS
  Filled 2020-05-26: qty 100

## 2020-05-26 MED ORDER — ATROPINE SULFATE 1 MG/ML IJ SOLN
INTRAMUSCULAR | Status: DC | PRN
  Administered 2020-05-26 (×3): 1 mg via INTRAVENOUS

## 2020-05-26 MED ORDER — NOREPINEPHRINE 4 MG/250ML-% IV SOLN
INTRAVENOUS | Status: AC
  Filled 2020-05-26: qty 250

## 2020-05-26 MED ORDER — PHENYLEPHRINE HCL-NACL 10-0.9 MG/250ML-% IV SOLN
INTRAVENOUS | Status: AC
  Filled 2020-05-26: qty 250

## 2020-05-26 MED ORDER — EPINEPHRINE HCL 5 MG/250ML IV SOLN IN NS
INTRAVENOUS | Status: AC
  Administered 2020-05-26: 16:00:00 2.667 ug/min via INTRAVENOUS
  Filled 2020-05-26: qty 250

## 2020-05-27 MED FILL — Medication: Qty: 1 | Status: AC

## 2020-06-10 NOTE — Code Documentation (Addendum)
Pulse check. No pulse.

## 2020-06-10 NOTE — Code Documentation (Signed)
CPR resumed 

## 2020-06-10 NOTE — Code Documentation (Signed)
Pulse check. No pulse. CPR continued.

## 2020-06-10 NOTE — Code Documentation (Signed)
IO drilled in.

## 2020-06-10 NOTE — ED Notes (Signed)
Started magnesium infusion.

## 2020-06-10 NOTE — ED Notes (Signed)
Placed on NRB 

## 2020-06-10 NOTE — ED Triage Notes (Signed)
Pt arrives via EMS from home with complaints of generalized weakness. Pt completed full tx at HD. Returned home and became to weak to walk. On arrival pt belly very tight and distended. Some small amt of vomit on shirt.  Alert and oriented X3

## 2020-06-10 NOTE — Code Documentation (Addendum)
Pulse check. No pulse. Time of death declared by Dr. Regenia Skeeter, MD.

## 2020-06-10 NOTE — Code Documentation (Signed)
Chest compressions begin

## 2020-06-10 NOTE — ED Notes (Signed)
Patient's family will be in Room 87.

## 2020-06-10 NOTE — ED Provider Notes (Signed)
[facility] EMERGENCY DEPARTMENT Provider Note   CSN: [id number] Arrival date & time: June 07, 2020  1413     History Chief Complaint  Patient presents with   Weakness    Matthew Rowe. is a 60 y.o. male with PMHX ESRD on dialysis T Th S, CHF with EF 35-40%, Diabetes, HTN, CAD s/p MI who presents to the ED via EMS for generalized weakness. Per EMS pt finished dialysis today; afterwards he became very weak and was unable to get out of the car at home prompting family to call EMS. Pt also had 1 episode of emesis PTA.   Pt states he felt fine this morning prior to attending dialysis. He has not missed any appointments. He felt very weak afterwards and sick to his stomach although he denies any abdominal pain. Pt's abdomen appeared distended on exam however he states it is normally that size and he needs to "work on it." He has no other complaints today.   Additional information obtained by wife on the phone - took pt to dialysis today. When she went to pick him up he seemed very weak and unable to walk too many steps. She put him in the back seat of the car and drove him home. She states he was very "shaky" upon going home so she fed him a sandwich. She states he could not get out of the car so she called EMS - his sugar was 93 after the sandwich so she assumes it was very low before eating and thinks his blood pressure was low too. She is unsure if he had a seizure as he was in the back seat however states that he acted a similar way when his "electrolytes were off" last time he was in the hospital.   The history is provided by the patient, the spouse, the EMS personnel and medical records.       Past Medical History:  Diagnosis Date   Acute on chronic combined systolic and diastolic CHF (congestive heart failure) ([place]) 10/15/2017   Chronic anemia    Chronic combined systolic and diastolic CHF (congestive heart failure) (HCC)    Chronic stable angina (HCC)    CKD  (chronic kidney disease), stage IV (HCC)    Coronary artery disease    a. INF MI 1999: PCI of RCA;  b.  extensive stenting of LAD;  c. s/p CABG with RIMA->RCA in 2006;  d. Stable, Low risk MV 05/2012; e. 09/2012 Cath: stable anatomy->med Rx. f. Abnl nuc 11/2013 -> med rx; g. 11/2013 Cath/PCI: s/p DES to RCA. h. Abnormal nuc 11/2016, pt declined cath.    DM2 (diabetes mellitus, type 2) (HCC)    Type II   Dyslipidemia    GERD (gastroesophageal reflux disease)    History of gout    Hypertension    Hypertensive heart disease    Ischemic cardiomyopathy    Mild aortic stenosis    Myocardial infarction ([place]) 1999; 2002; 2003; 2006; ~ 2008   OSA on CPAP    wears CPAP   Polycystic kidney disease     Patient Active Problem List   Diagnosis Date Noted   Seizure ([place]) 03/22/2020   Anemia of chronic disease 03/22/2020   NSTEMI (non-ST elevated myocardial infarction) ([place]) 07/10/2019   ESRD (end stage renal disease) ([place]) 07/10/2019   Coronary artery disease involving native heart with unstable angina pectoris (HCC)    Acute respiratory failure (HCC)    Acute diastolic (congestive) heart failure ([place])  Unstable angina ([place]) 06/18/2018   Leukocytosis 06/18/2018   Chronic pain 06/18/2018   Insomnia 06/18/2018   Chest pain 06/17/2018   Chronic heart failure with preserved ejection fraction ([place]) 06/06/2018   Anemia due to stage 4 chronic kidney disease ([place]) 02/12/2018   Coronary artery disease of native heart with stable angina pectoris ([place]) 10/25/2017   Chronic combined systolic and diastolic CHF (congestive heart failure) ([place]) 10/25/2017   Chronic anemia 10/15/2017   Elevated troponin 10/15/2017   CKD (chronic kidney disease) stage 4, GFR 15-29 ml/min (HCC) 07/22/2017   Hyperkalemia 04/01/2017   Non-ST elevation (NSTEMI) myocardial infarction ([place]) 04/01/2017   Chest pain with moderate risk of acute coronary syndrome 04/01/2017   Hx of CABG    Ischemic  cardiomyopathy 02/21/2017   HLD (hyperlipidemia)    GERD (gastroesophageal reflux disease) 05/08/2015   Gout 05/08/2015   MI (myocardial infarction) ([place]) 04/19/2015   CAD- S/P multiple PCIs, CABG    Hypertensive heart disease    Precordial chest pain 11/27/2013   Financial difficulty 03/13/2013   Medically noncompliant 03/13/2013   Type 2 diabetes with nephropathy ([place])    Obesity-BMI 42 11/27/2012   Polycystic kidney disease 09/15/2012   OSA on CPAP 09/15/2012   Hypertension associated with diabetes ([place]) 03/27/2011   Hyperlipidemia associated with type 2 diabetes mellitus ([place])     Past Surgical History:  Procedure Laterality Date   AV FISTULA PLACEMENT Left 05/29/2018   Procedure: ARTERIOVENOUS (AV) FISTULA CREATION  LEFT ARM.;  Surgeon: Serafina Mitchell, MD;  Location: [place];  Service: Vascular;  Laterality: Left;   [place] Left 07/24/2018   Procedure: SECOND STAGE BASCILIC VEIN TRANSPOSITION LEFT ARM;  Surgeon: Serafina Mitchell, MD;  Location: [place];  Service: Vascular;  Laterality: Left;   CARDIAC CATHETERIZATION  2012   CARDIAC CATHETERIZATION  2013   CARDIAC CATHETERIZATION  2014   [place] (1/14): total occlusion of distal LAD, diffuse disease in ramus, 70% proximal LCx, 50% proximal and mid RCA, 50% distal RCA, patent RIMA-RCA.   CORONARY ANGIOPLASTY WITH STENT PLACEMENT  1999 / 2002 / 2004 / 2006?   CORONARY ARTERY BYPASS GRAFT  2006   CABG X?; in [place]  1990's   off face   HERNIA REPAIR     inguinal hernia   LEFT HEART CATH AND CORONARY ANGIOGRAPHY N/A 07/13/2019   Procedure: LEFT HEART CATH AND CORONARY ANGIOGRAPHY;  Surgeon: Nelva Bush, MD;  Location: [place];  Service: Cardiovascular;  Laterality: N/A;   LEFT HEART CATHETERIZATION WITH CORONARY/GRAFT ANGIOGRAM N/A 09/16/2012   Procedure: LEFT HEART CATHETERIZATION WITH [name];  Surgeon: [name], MD;  Location: [facility] CATH  LAB;  Service: Cardiovascular;  Laterality: N/A;   LEFT HEART CATHETERIZATION WITH CORONARY/GRAFT ANGIOGRAM N/A 12/02/2013   Procedure: LEFT HEART CATHETERIZATION WITH [name];  Surgeon: [name], MD;  Location: [facility] CATH LAB;  Service: Cardiovascular;  Laterality: N/A;   PERCUTANEOUS CORONARY STENT INTERVENTION (PCI-S) N/A 12/03/2013   Procedure: PERCUTANEOUS CORONARY STENT INTERVENTION (PCI-S);  Surgeon: [name], MD;  Location: [facility] C[facility] LAB;  Service: Cardiovascular;  Laterality: N/A;   RIGHT/LEFT HEART CATH AND CORONARY/GRAFT ANGIOGRAPHY N/A 06/23/2018   Procedure: RIGHT/LEFT HEART CATH AND CORONARY/GRAFT ANGIOGRAPHY;  Surgeon: [name], MD;  Location: [facility] LAB;  Service: Cardiovascular;  Laterality: N/A;       Family History  Problem Relation Age of Onset   Lung cancer Mother    Anemia  Mother    Polycystic kidney disease Mother    Diabetes Father    Heart attack Father 47       Died suddenly   Heart failure Father    Hyperlipidemia Father    Hypertension Father    Kidney failure Sister 45       Died   Heart attack Sister    Hypertension Sister    Diabetes Sister    Diabetes Brother    Polycystic kidney disease Brother    Diabetes Sister     Social History   Tobacco Use   Smoking status: Former Smoker    Types: Cigarettes   Smokeless tobacco: Never Used   Tobacco comment: smoked some as a teenager (high school) not even a pack a week  Vaping Use   Vaping Use: Never used  Substance Use Topics   Alcohol use: No    Alcohol/week: 0.0 standard drinks    Comment: 09/15/2012 "drank a little when I was young"   Drug use: No    Home Medications Prior to Admission medications   Medication Sig Start Date End Date Taking? Authorizing Provider  acetaminophen (TYLENOL) 500 MG tablet Take 500 mg by mouth 2 (two) times daily as needed (for pain or headaches).     [provider]  allopurinol  (ZYLOPRIM) 100 MG tablet Take 50 mg by mouth 2 (two) times daily.     [provider]  atorvastatin (LIPITOR) 80 MG tablet Take 1 tablet (80 mg total) by mouth daily. Patient not taking: Reported on 05/09/2020 08/26/17   Charlie Pitter, PA-C  carvedilol (COREG) 25 MG tablet Take 1 tablet (25 mg total) by mouth 2 (two) times daily with a meal. Skip AM dose on dialysis days Patient taking differently: Take 25 mg by mouth 2 (two) times daily with a meal.  12/01/19   [name], MD  diphenhydrAMINE (BENADRYL) 25 MG tablet Take 25 mg by mouth See admin instructions. Takes on dialysis days for itching (M,W,F).    [provider]  divalproex (DEPAKOTE) 500 MG DR tablet Take 1 tablet (500 mg total) by mouth 2 (two) times daily. 03/23/20   Raiford Noble Latif, DO  glucose blood (ACCU-CHEK AVIVA PLUS) test strip 1 each by Other route in the morning, at noon, and at bedtime. 09/29/19   [provider]  insulin aspart protamine- aspart (NOVOLOG MIX 70/30) (70-30) 100 UNIT/ML injection Inject 20 Units into the skin as needed (if blood sugar is over 180).     [provider]  isosorbide mononitrate (IMDUR) 60 MG 24 hr tablet Take 1 tablet (60 mg total) by mouth 2 (two) times daily. 07/16/19 03/22/29  Barb Merino, MD  lidocaine-prilocaine (EMLA) cream Apply 1 application topically 3 (three) times a week. 12/01/19   [provider]  Misc. Devices MISC by Does not apply route. C-PAP    [provider]  Multiple Vitamin (MULTIVITAMIN WITH MINERALS) TABS tablet Take 1 tablet by mouth daily.    [provider]  pantoprazole (PROTONIX) 40 MG tablet Take 1 tablet (40 mg total) by mouth daily. 12/25/19   [name], MD  ranolazine (RANEXA) 500 MG 12 hr tablet Take 1 tablet (500 mg total) by mouth 2 (two) times daily. Patient taking differently: Take 1,000 mg by mouth 2 (two) times daily.  03/29/17   End, Harrell Gave, MD  Semaglutide (OZEMPIC, 0.25  OR 0.5 MG/DOSE, [place]) Inject 0.25 mg into the skin every Saturday.  [provider]  zolpidem (AMBIEN) 10 MG tablet Take 10 mg by mouth at bedtime as needed for sleep.  Patient not taking: Reported on 05/09/2020 09/30/17   [provider]    Allergies    Cleocin [clindamycin hcl], Glimepiride [amaryl], Clarithromycin, Colchicine, and Ciprocin-fluocin-procin [fluocinolone acetonide]  Review of Systems   Review of Systems  Constitutional: Negative for fever.  Respiratory: Negative for shortness of breath.   Cardiovascular: Negative for chest pain.  Gastrointestinal: Positive for nausea and vomiting. Negative for abdominal distention, abdominal pain, constipation and diarrhea.  Neurological: Positive for weakness (generalized).  All other systems reviewed and are negative.   Physical Exam Updated Vital Signs BP (!) 115/94 (BP Location: Right Arm)    Pulse (!) 31    Temp 98.7 F (37.1 C) (Axillary)    Resp 16    Ht 5\' 5"  (1.651 m)    Wt 101 kg    SpO2 (!) 83%    BMI 37.05 kg/m   Physical Exam Vitals and nursing note reviewed.  Constitutional:      Appearance: He is obese. He is not ill-appearing or diaphoretic.     Comments: Fatigued but easily arousable  HENT:     Head: Normocephalic and atraumatic.  Eyes:     Extraocular Movements: Extraocular movements intact.     Conjunctiva/sclera: Conjunctivae normal.     Pupils: Pupils are equal, round, and reactive to light.  Cardiovascular:     Rate and Rhythm: Normal rate and regular rhythm.     Pulses: Normal pulses.  Pulmonary:     Effort: Pulmonary effort is normal.     Breath sounds: Normal breath sounds. No wheezing, rhonchi or rales.  Abdominal:     General: There is distension.     Palpations: Abdomen is soft.     Tenderness: There is no abdominal tenderness. There is no guarding or rebound.  Musculoskeletal:     Cervical back: Neck supple.  Skin:    General: Skin is warm and dry.  Neurological:      Mental Status: He is alert.     Comments: CN 3-12 grossly intact A&O x4 GCS 15 Sensation and strength intact     ED Results / Procedures / Treatments   Labs (all labs ordered are listed, but only abnormal results are displayed) Labs Reviewed  COMPREHENSIVE METABOLIC PANEL - Abnormal; Notable for the following components:      Result Value   Sodium 134 (*)    Chloride 95 (*)    Glucose, Bld 175 (*)    Creatinine, Ser 3.17 (*)    Total Bilirubin 1.3 (*)    GFR calc non Af Amer 20 (*)    GFR calc Af Amer 23 (*)    All other components within normal limits  CBC WITH DIFFERENTIAL/PLATELET - Abnormal; Notable for the following components:   RBC 3.69 (*)    Hemoglobin 11.4 (*)    HCT 35.5 (*)    RDW 17.7 (*)    Platelets 97 (*)    All other components within normal limits  CBG MONITORING, ED - Abnormal; Notable for the following components:   Glucose-Capillary 171 (*)    All other components within normal limits  LIPASE, BLOOD  VALPROIC ACID LEVEL  TROPONIN I (HIGH SENSITIVITY)  TROPONIN I (HIGH SENSITIVITY)    EKG EKG Interpretation  Date/Time:  2020/06/18 14:23:11 EDT Ventricular Rate:  55 PR Interval:    QRS Duration: 131 QT Interval:  485 QTC Calculation: 464 R Axis:   -68 Text Interpretation: Sinus rhythm Ventricular preexcitation(WPW)? similar to priors Confirmed by Sherwood Gambler [phone]) on 06-09-2020 3:10:29 PM   Radiology DG Chest Port 1 View  Result Date: 06-09-2020 CLINICAL DATA:  Renal failure.  Weakness. EXAM: PORTABLE CHEST 1 VIEW COMPARISON:  December 04, 2019 FINDINGS: There is no edema or airspace opacity. Heart is enlarged with pulmonary vascularity normal. No adenopathy. Patient is status post coronary artery bypass grafting. No bone lesions. IMPRESSION: Cardiomegaly. Status post median sternotomy. No edema or airspace opacity. Electronically Signed   By: Lowella Grip III M.D.   On: 06/09/20 14:58    Procedures Procedures  (including critical care time)  Medications Ordered in ED Medications  LORazepam (ATIVAN) 2 MG/ML injection (has no administration in time range)  LORazepam (ATIVAN) 2 MG/ML injection (has no administration in time range)  levETIRAcetam (KEPPRA) IVPB 1500 mg/ 100 mL premix (has no administration in time range)  LORazepam (ATIVAN) 2 MG/ML injection (has no administration in time range)  phenylephrine (NEOSYNEPHRINE) 10-0.9 MG/250ML-% infusion (has no administration in time range)  EPINEPHrine (ADRENALIN) 4 mg in NS 250 mL (0.016 mg/mL) premix infusion (has no administration in time range)  norepinephrine (LEVOPHED) 4-5 MG/250ML-% infusion SOLN (has no administration in time range)  EPINEPHrine NaCl 4-0.9 MG/250ML-% premix infusion (has no administration in time range)  ondansetron (ZOFRAN) injection 4 mg (4 mg Intravenous Given Jun 09, 2020 1505)    ED Course  I have reviewed the triage vital signs and the nursing notes.  Pertinent labs & imaging results that were available during my care of the patient were reviewed by me and considered in my medical decision making (see chart for details).    MDM Rules/Calculators/A&P                          59 year old male presenting to the ED after full dialysis session today for generalized weakness/hypoglycemia?/hypotension?/seizure?Marland Kitchen On arrival to the ED pt is afebrile, nontachycardic, and nontachypneic. He appears to be fatigued however easily arousable. A&O x 4. He had 1 episode of emesis prior to arrival and a second one in the ED - zofran provided. Pt following all commands at this time and no focal neuro deficits on exam. His abdomen appears distended however both he and his wife state its no bigger than normal. Pt denies any complaints of pain currently. Per chart review pt seen in the ED in June for questionable seizure like activity - he was admitted at that time with EEG without signs of seizures. MRI brain without focal cortical abnormaity to  explain seizure and left scalp contusion. Pt was discharged on keppra per neurology at that time. Difficult to say if pt had seizure in the car but will continue to monitor. Will order EKG, CXR, labs including CBC, CMP, troponin, lipase, and depakote level.   At approximately 1537 I was notified by nursing staff that pt was having seizure like activity. Prior to getting into the room seizure stopped - 1 mg ativan ordered initially. Pt then proceeded to have 3 additional seizures (total of 4). He was given total of 4 mg ativan prior to the 3rd seizure and neurology was consulted. He then proceeded to have 2 more seizures and an additional 4 mg ativan was given (total of 8 mg). Seizure activity stopped however pt's blood pressure then proceeded to decrease and subsequently HR slowed. Pt then went into cardiac arrest. CPR was  initiated however despite roughly 30 minutes of this, intubation, multiple rounds of epi and atropine, mag, calcium no pulses were detected and pt was pronounced at 1620. Please see additional note from attending physician Dr. Regenia Skeeter.   Final Clinical Impression(s) / ED Diagnoses Final diagnoses:  Weakness    Rx / DC Orders ED Discharge Orders    None       Eustaquio Maize, PA-C June 15, 2020 [place], MD 05/27/20 1440

## 2020-06-10 NOTE — ED Notes (Addendum)
Pt having 3rd seizure. Lasting about 10-15 seconds. 2 mg ativan given

## 2020-06-10 NOTE — ED Provider Notes (Signed)
Patient presented with syncope vs seizure. While doing workup, he developed seizures, at least 4, which required ativan. After 4 mg ativan (2 and 2) he was still having seizures and not waking up, and received more ativan. Seizures stopped, but then he has a rapid decline of BP and became bradycardic and arrested. Despite ACLS, including epinephrine, atropine, magnesium, calcium, intubation and good CPR, he was unable to be resuscitated. He was pronounced at 1620.  Cardiopulmonary Resuscitation (CPR) Procedure Note Directed/Performed by: Ephraim Hamburger I personally directed ancillary staff and/or performed CPR in an effort to regain return of spontaneous circulation and to maintain cardiac, neuro and systemic perfusion.    INTUBATION Performed by: Ephraim Hamburger  Required items: required blood products, implants, devices, and special equipment available Patient identity confirmed: provided demographic data and hospital-assigned identification number Time out: Immediately prior to procedure a "time out" was called to verify the correct patient, procedure, equipment, support staff and site/side marked as required.  Indications: respiratory failure  Intubation method: Glidescope Laryngoscopy   Preoxygenation: BVM  Tube Size: 7.5 cuffed  Post-procedure assessment: chest rise and ETCO2 monitor Breath sounds: equal and absent over the epigastrium Tube secured with: ETT holder  Chest x-ray findings: none available, active CPR  Patient tolerated the procedure well with no immediate complications.      Sherwood Gambler, MD 06-08-2020 [phone]

## 2020-06-10 NOTE — ED Notes (Signed)
Pt having 2nd seizure. Tongue bleeding. PA and MD at bedside. 2mg  ativan given

## 2020-06-10 NOTE — ED Notes (Signed)
Pt having seizure. MD notified to bedside.

## 2020-06-10 NOTE — Progress Notes (Signed)
   05-27-20 1700  Clinical Encounter Type  Visited With Family  Visit Type Death  Referral From Nurse  Consult/Referral To Chaplain  Receive page to visit wife. Spoke with nurse, Kathlee Nations, informed me patient's wife and daughter needed support after passing of her husband. I visit with Mrs. Torien Ramroop after passing of patient. Mrs. Henrene Pastor stated his family is in different states. She was unable to give name of funeral. Kathlee Nations provided contact information to Patient placement. Mrs. Dugal will call tomorrow. Mrs. Smeltz is requesting autopsy and would like for family to visit deceased tomorrow. Offered prayer.This note was prepared by Jeanine Luz, M.Div..  For questions please contact by phone [phone].

## 2020-06-10 NOTE — ED Notes (Signed)
Pt having 4th seizure. 4mg  ativan given

## 2020-06-10 NOTE — ED Notes (Signed)
Calcium pushed by Oran Rein, RN.

## 2020-06-10 DEATH — deceased

## 2020-07-11 ENCOUNTER — Ambulatory Visit: Payer: No Typology Code available for payment source | Admitting: Neurology
